# Patient Record
Sex: Female | Born: 1940 | Race: White | Hispanic: No | Marital: Married | Smoking: Never smoker
Health system: Southern US, Community
[De-identification: ages and names within clinical notes are randomized; demographics above are authoritative.]

## PROBLEM LIST (undated history)

## (undated) DIAGNOSIS — N393 Stress incontinence (female) (male): Secondary | ICD-10-CM

## (undated) DIAGNOSIS — J479 Bronchiectasis, uncomplicated: Secondary | ICD-10-CM

## (undated) DIAGNOSIS — S32001A Stable burst fracture of unspecified lumbar vertebra, initial encounter for closed fracture: Secondary | ICD-10-CM

## (undated) DIAGNOSIS — E039 Hypothyroidism, unspecified: Secondary | ICD-10-CM

## (undated) DIAGNOSIS — C8580 Other specified types of non-Hodgkin lymphoma, unspecified site: Secondary | ICD-10-CM

## (undated) DIAGNOSIS — M542 Cervicalgia: Secondary | ICD-10-CM

## (undated) DIAGNOSIS — H3530 Unspecified macular degeneration: Secondary | ICD-10-CM

## (undated) DIAGNOSIS — I10 Essential (primary) hypertension: Secondary | ICD-10-CM

## (undated) DIAGNOSIS — D479 Neoplasm of uncertain behavior of lymphoid, hematopoietic and related tissue, unspecified: Secondary | ICD-10-CM

## (undated) DIAGNOSIS — Z9889 Other specified postprocedural states: Secondary | ICD-10-CM

## (undated) DIAGNOSIS — D649 Anemia, unspecified: Secondary | ICD-10-CM

## (undated) DIAGNOSIS — B029 Zoster without complications: Secondary | ICD-10-CM

## (undated) DIAGNOSIS — K5909 Other constipation: Secondary | ICD-10-CM

## (undated) DIAGNOSIS — R112 Nausea with vomiting, unspecified: Secondary | ICD-10-CM

## (undated) DIAGNOSIS — J189 Pneumonia, unspecified organism: Secondary | ICD-10-CM

## (undated) DIAGNOSIS — R011 Cardiac murmur, unspecified: Secondary | ICD-10-CM

## (undated) DIAGNOSIS — Z973 Presence of spectacles and contact lenses: Secondary | ICD-10-CM

## (undated) DIAGNOSIS — E0590 Thyrotoxicosis, unspecified without thyrotoxic crisis or storm: Secondary | ICD-10-CM

## (undated) DIAGNOSIS — R0602 Shortness of breath: Secondary | ICD-10-CM

## (undated) DIAGNOSIS — M8580 Other specified disorders of bone density and structure, unspecified site: Secondary | ICD-10-CM

## (undated) DIAGNOSIS — K219 Gastro-esophageal reflux disease without esophagitis: Secondary | ICD-10-CM

## (undated) DIAGNOSIS — Z9289 Personal history of other medical treatment: Secondary | ICD-10-CM

## (undated) DIAGNOSIS — C801 Malignant (primary) neoplasm, unspecified: Secondary | ICD-10-CM

## (undated) DIAGNOSIS — E785 Hyperlipidemia, unspecified: Secondary | ICD-10-CM

## (undated) DIAGNOSIS — R161 Splenomegaly, not elsewhere classified: Secondary | ICD-10-CM

## (undated) DIAGNOSIS — M1990 Unspecified osteoarthritis, unspecified site: Secondary | ICD-10-CM

## (undated) HISTORY — PX: DILATION AND CURETTAGE OF UTERUS: SHX78

## (undated) HISTORY — PX: CATARACT EXTRACTION, BILATERAL: SHX1313

## (undated) HISTORY — DX: Other constipation: K59.09

## (undated) HISTORY — PX: EYE SURGERY: SHX253

## (undated) HISTORY — PX: WISDOM TOOTH EXTRACTION: SHX21

## (undated) HISTORY — PX: FRACTURE SURGERY: SHX138

## (undated) HISTORY — PX: TUBAL LIGATION: SHX77

## (undated) HISTORY — PX: TONSILLECTOMY: SUR1361

## (undated) HISTORY — PX: COLONOSCOPY: SHX174

## (undated) HISTORY — DX: Other specified disorders of bone density and structure, unspecified site: M85.80

## (undated) HISTORY — DX: Hyperlipidemia, unspecified: E78.5

## (undated) HISTORY — PX: CATARACT EXTRACTION: SUR2

## (undated) HISTORY — DX: Unspecified macular degeneration: H35.30

## (undated) HISTORY — DX: Hypothyroidism, unspecified: E03.9

## (undated) HISTORY — PX: TONSILLECTOMY AND ADENOIDECTOMY: SHX28

---
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
## 1978-03-04 HISTORY — PX: BREAST EXCISIONAL BIOPSY: SUR124

## 1997-03-04 HISTORY — PX: CARPAL TUNNEL RELEASE: SHX101

## 1997-07-08 ENCOUNTER — Other Ambulatory Visit: Admission: RE | Admit: 1997-07-08 | Discharge: 1997-07-08 | Payer: Self-pay | Admitting: Obstetrics and Gynecology

## 1997-10-11 ENCOUNTER — Ambulatory Visit (HOSPITAL_COMMUNITY): Admission: RE | Admit: 1997-10-11 | Discharge: 1997-10-11 | Payer: Self-pay | Admitting: Obstetrics and Gynecology

## 1998-02-13 ENCOUNTER — Other Ambulatory Visit: Admission: RE | Admit: 1998-02-13 | Discharge: 1998-02-13 | Payer: Self-pay | Admitting: Obstetrics and Gynecology

## 1998-10-31 ENCOUNTER — Ambulatory Visit (HOSPITAL_COMMUNITY)
Admission: RE | Admit: 1998-10-31 | Discharge: 1998-10-31 | Payer: Self-pay | Admitting: Physical Medicine & Rehabilitation

## 1998-10-31 ENCOUNTER — Encounter: Payer: Self-pay | Admitting: Obstetrics and Gynecology

## 1998-11-02 ENCOUNTER — Ambulatory Visit (HOSPITAL_COMMUNITY): Admission: RE | Admit: 1998-11-02 | Discharge: 1998-11-02 | Payer: Self-pay | Admitting: Obstetrics and Gynecology

## 1998-11-02 ENCOUNTER — Encounter: Payer: Self-pay | Admitting: Obstetrics and Gynecology

## 1999-03-08 ENCOUNTER — Other Ambulatory Visit: Admission: RE | Admit: 1999-03-08 | Discharge: 1999-03-08 | Payer: Self-pay | Admitting: Obstetrics and Gynecology

## 1999-05-25 ENCOUNTER — Encounter (INDEPENDENT_AMBULATORY_CARE_PROVIDER_SITE_OTHER): Payer: Self-pay

## 1999-05-25 ENCOUNTER — Ambulatory Visit (HOSPITAL_COMMUNITY): Admission: RE | Admit: 1999-05-25 | Discharge: 1999-05-25 | Payer: Self-pay | Admitting: Obstetrics and Gynecology

## 1999-12-04 ENCOUNTER — Ambulatory Visit (HOSPITAL_COMMUNITY): Admission: RE | Admit: 1999-12-04 | Discharge: 1999-12-04 | Payer: Self-pay | Admitting: Obstetrics and Gynecology

## 1999-12-04 ENCOUNTER — Encounter: Payer: Self-pay | Admitting: Obstetrics and Gynecology

## 2001-04-02 ENCOUNTER — Encounter: Admission: RE | Admit: 2001-04-02 | Discharge: 2001-04-02 | Payer: Self-pay | Admitting: Obstetrics and Gynecology

## 2001-04-02 ENCOUNTER — Encounter: Payer: Self-pay | Admitting: Obstetrics and Gynecology

## 2002-07-05 ENCOUNTER — Ambulatory Visit (HOSPITAL_COMMUNITY): Admission: RE | Admit: 2002-07-05 | Discharge: 2002-07-05 | Payer: Self-pay | Admitting: Obstetrics and Gynecology

## 2002-07-05 ENCOUNTER — Encounter: Payer: Self-pay | Admitting: Obstetrics and Gynecology

## 2003-07-04 ENCOUNTER — Other Ambulatory Visit: Admission: RE | Admit: 2003-07-04 | Discharge: 2003-07-04 | Payer: Self-pay | Admitting: Obstetrics and Gynecology

## 2003-08-03 ENCOUNTER — Encounter: Admission: RE | Admit: 2003-08-03 | Discharge: 2003-08-03 | Payer: Self-pay | Admitting: Obstetrics and Gynecology

## 2004-01-23 ENCOUNTER — Ambulatory Visit: Payer: Self-pay | Admitting: Internal Medicine

## 2004-08-03 ENCOUNTER — Other Ambulatory Visit: Admission: RE | Admit: 2004-08-03 | Discharge: 2004-08-03 | Payer: Self-pay | Admitting: Obstetrics and Gynecology

## 2004-09-11 ENCOUNTER — Encounter: Admission: RE | Admit: 2004-09-11 | Discharge: 2004-09-11 | Payer: Self-pay | Admitting: Obstetrics and Gynecology

## 2005-03-12 ENCOUNTER — Ambulatory Visit: Payer: Self-pay | Admitting: Family Medicine

## 2005-04-16 ENCOUNTER — Ambulatory Visit: Payer: Self-pay | Admitting: Internal Medicine

## 2005-05-14 ENCOUNTER — Ambulatory Visit: Payer: Self-pay | Admitting: Internal Medicine

## 2005-06-21 ENCOUNTER — Ambulatory Visit: Payer: Self-pay | Admitting: Internal Medicine

## 2005-09-09 ENCOUNTER — Ambulatory Visit: Payer: Self-pay | Admitting: Internal Medicine

## 2005-09-10 ENCOUNTER — Ambulatory Visit: Payer: Self-pay | Admitting: Family Medicine

## 2005-09-26 ENCOUNTER — Encounter: Admission: RE | Admit: 2005-09-26 | Discharge: 2005-09-26 | Payer: Self-pay | Admitting: Obstetrics and Gynecology

## 2005-09-27 ENCOUNTER — Ambulatory Visit: Payer: Self-pay | Admitting: Internal Medicine

## 2006-07-03 ENCOUNTER — Ambulatory Visit: Payer: Self-pay | Admitting: Internal Medicine

## 2006-07-03 LAB — CONVERTED CEMR LAB
Direct LDL: 155.8 mg/dL
VLDL: 14 mg/dL (ref 0–40)

## 2006-07-11 ENCOUNTER — Encounter: Admission: RE | Admit: 2006-07-11 | Discharge: 2006-07-11 | Payer: Self-pay | Admitting: Internal Medicine

## 2006-08-06 DIAGNOSIS — M949 Disorder of cartilage, unspecified: Secondary | ICD-10-CM

## 2006-08-06 DIAGNOSIS — M899 Disorder of bone, unspecified: Secondary | ICD-10-CM | POA: Insufficient documentation

## 2006-08-06 DIAGNOSIS — R32 Unspecified urinary incontinence: Secondary | ICD-10-CM

## 2006-08-06 DIAGNOSIS — E039 Hypothyroidism, unspecified: Secondary | ICD-10-CM

## 2006-08-06 DIAGNOSIS — Z85828 Personal history of other malignant neoplasm of skin: Secondary | ICD-10-CM

## 2006-10-01 ENCOUNTER — Encounter: Admission: RE | Admit: 2006-10-01 | Discharge: 2006-10-01 | Payer: Self-pay | Admitting: Obstetrics and Gynecology

## 2006-10-13 ENCOUNTER — Encounter: Payer: Self-pay | Admitting: Internal Medicine

## 2006-12-12 ENCOUNTER — Other Ambulatory Visit
Admission: RE | Admit: 2006-12-12 | Discharge: 2006-12-12 | Payer: Self-pay | Admitting: Physical Medicine & Rehabilitation

## 2007-04-24 ENCOUNTER — Encounter: Payer: Self-pay | Admitting: Internal Medicine

## 2007-07-17 ENCOUNTER — Ambulatory Visit: Payer: Self-pay | Admitting: Internal Medicine

## 2007-07-19 ENCOUNTER — Encounter: Payer: Self-pay | Admitting: Internal Medicine

## 2007-07-19 DIAGNOSIS — R930 Abnormal findings on diagnostic imaging of skull and head, not elsewhere classified: Secondary | ICD-10-CM | POA: Insufficient documentation

## 2007-07-20 ENCOUNTER — Telehealth (INDEPENDENT_AMBULATORY_CARE_PROVIDER_SITE_OTHER): Payer: Self-pay | Admitting: *Deleted

## 2007-07-20 ENCOUNTER — Encounter (INDEPENDENT_AMBULATORY_CARE_PROVIDER_SITE_OTHER): Payer: Self-pay | Admitting: *Deleted

## 2007-07-24 ENCOUNTER — Ambulatory Visit: Payer: Self-pay | Admitting: Cardiovascular Disease

## 2007-07-28 ENCOUNTER — Telehealth (INDEPENDENT_AMBULATORY_CARE_PROVIDER_SITE_OTHER): Payer: Self-pay | Admitting: *Deleted

## 2007-07-28 ENCOUNTER — Encounter: Payer: Self-pay | Admitting: Internal Medicine

## 2007-07-28 ENCOUNTER — Encounter (INDEPENDENT_AMBULATORY_CARE_PROVIDER_SITE_OTHER): Payer: Self-pay | Admitting: *Deleted

## 2007-07-31 ENCOUNTER — Encounter: Payer: Self-pay | Admitting: Internal Medicine

## 2007-09-02 ENCOUNTER — Ambulatory Visit: Payer: Self-pay | Admitting: Internal Medicine

## 2007-09-03 ENCOUNTER — Telehealth (INDEPENDENT_AMBULATORY_CARE_PROVIDER_SITE_OTHER): Payer: Self-pay | Admitting: *Deleted

## 2007-09-11 ENCOUNTER — Telehealth (INDEPENDENT_AMBULATORY_CARE_PROVIDER_SITE_OTHER): Payer: Self-pay | Admitting: *Deleted

## 2007-09-11 LAB — CONVERTED CEMR LAB
Albumin: 4 g/dL (ref 3.5–5.2)
Alkaline Phosphatase: 60 units/L (ref 39–117)
BUN: 15 mg/dL (ref 6–23)
Basophils Absolute: 0.1 10*3/uL (ref 0.0–0.1)
Chloride: 106 meq/L (ref 96–112)
Cholesterol: 233 mg/dL (ref 0–200)
Eosinophils Absolute: 0.1 10*3/uL (ref 0.0–0.7)
GFR calc Af Amer: 92 mL/min
GFR calc non Af Amer: 76 mL/min
HCT: 37.5 % (ref 36.0–46.0)
HDL: 59.2 mg/dL (ref >39.0)
MCHC: 34.5 g/dL (ref 30.0–36.0)
MCV: 80.3 fL (ref 78.0–100.0)
Monocytes Absolute: 0.5 10*3/uL (ref 0.1–1.0)
Neutrophils Relative %: 57.9 % (ref 43.0–77.0)
Platelets: 283 10*3/uL (ref 150–400)
Potassium: 4 meq/L (ref 3.5–5.1)
RDW: 13.4 % (ref 11.5–14.6)
Sodium: 142 meq/L (ref 135–145)
Total Bilirubin: 0.8 mg/dL (ref 0.3–1.2)
Triglycerides: 76 mg/dL (ref 0–149)

## 2007-10-20 ENCOUNTER — Encounter: Admission: RE | Admit: 2007-10-20 | Discharge: 2007-10-20 | Payer: Self-pay | Admitting: Obstetrics and Gynecology

## 2008-01-18 ENCOUNTER — Ambulatory Visit: Payer: Self-pay | Admitting: Internal Medicine

## 2008-01-19 LAB — CONVERTED CEMR LAB: TSH: 3.15 microintl units/mL (ref 0.35–5.50)

## 2008-01-20 ENCOUNTER — Encounter (INDEPENDENT_AMBULATORY_CARE_PROVIDER_SITE_OTHER): Payer: Self-pay | Admitting: *Deleted

## 2008-02-04 ENCOUNTER — Telehealth (INDEPENDENT_AMBULATORY_CARE_PROVIDER_SITE_OTHER): Payer: Self-pay | Admitting: *Deleted

## 2008-09-12 ENCOUNTER — Telehealth: Payer: Self-pay | Admitting: Internal Medicine

## 2008-09-12 DIAGNOSIS — M542 Cervicalgia: Secondary | ICD-10-CM | POA: Insufficient documentation

## 2008-09-21 ENCOUNTER — Encounter: Admission: RE | Admit: 2008-09-21 | Discharge: 2008-12-20 | Payer: Self-pay | Admitting: Internal Medicine

## 2008-09-23 ENCOUNTER — Encounter: Payer: Self-pay | Admitting: Internal Medicine

## 2008-10-25 ENCOUNTER — Encounter: Admission: RE | Admit: 2008-10-25 | Discharge: 2008-10-25 | Payer: Self-pay | Admitting: Obstetrics and Gynecology

## 2008-11-14 ENCOUNTER — Telehealth (INDEPENDENT_AMBULATORY_CARE_PROVIDER_SITE_OTHER): Payer: Self-pay | Admitting: *Deleted

## 2008-11-22 ENCOUNTER — Encounter: Payer: Self-pay | Admitting: Internal Medicine

## 2008-12-02 ENCOUNTER — Telehealth: Payer: Self-pay | Admitting: Internal Medicine

## 2008-12-05 ENCOUNTER — Other Ambulatory Visit: Admission: RE | Admit: 2008-12-05 | Discharge: 2008-12-05 | Payer: Self-pay | Admitting: Obstetrics and Gynecology

## 2009-01-05 ENCOUNTER — Ambulatory Visit: Payer: Self-pay | Admitting: Internal Medicine

## 2009-01-06 ENCOUNTER — Encounter (INDEPENDENT_AMBULATORY_CARE_PROVIDER_SITE_OTHER): Payer: Self-pay | Admitting: *Deleted

## 2009-01-09 ENCOUNTER — Encounter: Payer: Self-pay | Admitting: Internal Medicine

## 2009-01-12 ENCOUNTER — Ambulatory Visit: Payer: Self-pay | Admitting: Internal Medicine

## 2009-01-12 DIAGNOSIS — E785 Hyperlipidemia, unspecified: Secondary | ICD-10-CM

## 2009-02-15 ENCOUNTER — Telehealth: Payer: Self-pay | Admitting: Internal Medicine

## 2009-04-11 ENCOUNTER — Encounter: Payer: Self-pay | Admitting: Internal Medicine

## 2009-04-21 ENCOUNTER — Ambulatory Visit: Payer: Self-pay | Admitting: Internal Medicine

## 2009-04-21 DIAGNOSIS — G5600 Carpal tunnel syndrome, unspecified upper limb: Secondary | ICD-10-CM

## 2009-04-21 DIAGNOSIS — R269 Unspecified abnormalities of gait and mobility: Secondary | ICD-10-CM

## 2009-04-21 DIAGNOSIS — M6281 Muscle weakness (generalized): Secondary | ICD-10-CM

## 2009-04-24 LAB — CONVERTED CEMR LAB
Basophils Absolute: 0 10*3/uL (ref 0.0–0.1)
Basophils Relative: 0.3 % (ref 0.0–3.0)
Eosinophils Absolute: 0.1 10*3/uL (ref 0.0–0.7)
Lymphocytes Relative: 22.8 % (ref 12.0–46.0)
MCHC: 33 g/dL (ref 30.0–36.0)
Magnesium: 2.3 mg/dL (ref 1.5–2.5)
Monocytes Relative: 10.7 % (ref 3.0–12.0)
Neutrophils Relative %: 64.4 % (ref 43.0–77.0)
Potassium: 4.4 meq/L (ref 3.5–5.1)
RBC: 4.47 M/uL (ref 3.87–5.11)
RDW: 13.1 % (ref 11.5–14.6)

## 2009-06-07 ENCOUNTER — Encounter: Payer: Self-pay | Admitting: Internal Medicine

## 2009-08-16 ENCOUNTER — Encounter: Payer: Self-pay | Admitting: Internal Medicine

## 2009-09-01 ENCOUNTER — Ambulatory Visit: Payer: Self-pay | Admitting: Family Medicine

## 2009-09-01 ENCOUNTER — Inpatient Hospital Stay (HOSPITAL_COMMUNITY): Admission: AD | Admit: 2009-09-01 | Discharge: 2009-09-03 | Payer: Self-pay | Admitting: Internal Medicine

## 2009-09-01 DIAGNOSIS — J189 Pneumonia, unspecified organism: Secondary | ICD-10-CM

## 2009-09-11 ENCOUNTER — Ambulatory Visit: Payer: Self-pay | Admitting: Internal Medicine

## 2009-09-11 DIAGNOSIS — R6883 Chills (without fever): Secondary | ICD-10-CM

## 2009-09-14 ENCOUNTER — Telehealth (INDEPENDENT_AMBULATORY_CARE_PROVIDER_SITE_OTHER): Payer: Self-pay | Admitting: *Deleted

## 2009-09-15 LAB — CONVERTED CEMR LAB
Basophils Absolute: 0.1 10*3/uL (ref 0.0–0.1)
CO2: 30 meq/L (ref 19–32)
Calcium: 9 mg/dL (ref 8.4–10.5)
Creatinine, Ser: 0.8 mg/dL (ref 0.4–1.2)
Eosinophils Absolute: 0.1 10*3/uL (ref 0.0–0.7)
Glucose, Bld: 71 mg/dL (ref 70–99)
Lymphocytes Relative: 26.1 % (ref 12.0–46.0)
MCHC: 33.7 g/dL (ref 30.0–36.0)
Neutrophils Relative %: 60.8 % (ref 43.0–77.0)
Platelets: 458 10*3/uL — ABNORMAL HIGH (ref 150.0–400.0)
RDW: 14.1 % (ref 11.5–14.6)

## 2009-10-02 ENCOUNTER — Telehealth (INDEPENDENT_AMBULATORY_CARE_PROVIDER_SITE_OTHER): Payer: Self-pay | Admitting: *Deleted

## 2009-10-05 ENCOUNTER — Ambulatory Visit: Payer: Self-pay | Admitting: Internal Medicine

## 2009-10-31 ENCOUNTER — Encounter: Admission: RE | Admit: 2009-10-31 | Discharge: 2009-10-31 | Payer: Self-pay | Admitting: Obstetrics and Gynecology

## 2010-04-01 LAB — CONVERTED CEMR LAB
Hgb A1c MFr Bld: 5.7 % (ref 4.6–6.5)
TSH: 1.14 microintl units/mL (ref 0.35–5.50)

## 2010-04-03 NOTE — Assessment & Plan Note (Signed)
Summary: HOSPITAL FOLLOWUP/KN   Vital Signs:  Patient profile:   70 year old female Weight:      151.4 pounds O2 Sat:      97 % on Room air Temp:     98.3 degrees F oral Pulse rate:   72 / minute Resp:     14 per minute BP sitting:   118 / 70  (left arm) Cuff size:   large  Vitals Entered By: Shonna Chock (September 11, 2009 11:30 AM)  O2 Flow:  Room air CC: Hospital Follow-up: Pneumonia, Stopped Chlosterol med-made patient sick Comments REVIEWED MED LIST, PATIENT AGREED DOSE AND INSTRUCTION CORRECT    CC:  Hospital Follow-up: Pneumonia and Stopped Chlosterol med-made patient sick.  History of Present Illness: S/P OS cataract resection 08/22/2009; she developed  nausea & weakness 06/23 which was progressive. PNA was dxed 06/29; she had had intermittent NP cough in context of Temp to104.2. She took Levaquin X 3 days  but  it was stopped due to  N&V. Admitted with dehydration 07/01; she left 07/03. Now residual nausea & weakness.Intermittent chills.  Allergies (verified): 1)  ! Levaquin 2)  ! Pravachol  Review of Systems General:  Denies fever and sweats. Resp:  Complains of shortness of breath; denies chest pain with inspiration, cough, coughing up blood, sputum productive, and wheezing. GI:  Denies abdominal pain, constipation, and diarrhea; On Miralax every other day . GU:  Denies discharge, dysuria, and hematuria; No dark urine.  Physical Exam  Eyes:  No corneal or conjunctival inflammation noted. Perrla.No icterus Ears:  External ear exam shows no significant lesions or deformities.  Otoscopic examination reveals clear canals, tympanic membranes are intact bilaterally without bulging, retraction, inflammation or discharge. Hearing is grossly normal bilaterally. Nose:  External nasal examination shows no deformity or inflammation. Nasal mucosa are pink and moist without lesions or exudates. Mouth:  Oral mucosa and oropharynx without lesions or exudates.  Tongue moist Lungs:   Normal respiratory effort, chest expands symmetrically. Lungs are clear to auscultation, no crackles or wheezes. Heart:  normal rate, regular rhythm, no gallop, no rub, no JVD, no HJR, and grade 1 /6 systolic murmur.   Abdomen:  Bowel sounds positive,abdomen soft and non-tender without masses, organomegaly or hernias noted. Skin:  Intact without suspicious lesions or rashes. Tenting Cervical Nodes:  No lymphadenopathy noted Axillary Nodes:  No palpable lymphadenopathy   Impression & Recommendations:  Problem # 1:  NAUSEA (ICD-787.02)  Orders: Venipuncture (16109) TLB-BMP (Basic Metabolic Panel-BMET) (80048-METABOL)  Problem # 2:  CHILLS WITHOUT FEVER (ICD-780.64)  Orders: Venipuncture (60454) TLB-CBC Platelet - w/Differential (85025-CBCD) T-2 View CXR (71020TC)  Problem # 3:  PNEUMONIA (ICD-486)  Orders: Venipuncture (09811) TLB-CBC Platelet - w/Differential (85025-CBCD) T-2 View CXR (71020TC)  Complete Medication List: 1)  Synthroid 100 Mcg Tabs (Levothyroxine sodium) .[name].. 1 by mouth once daily except 1&1/2 q weds 2)  Fosamax 70 Mg Tabs (Alendronate sodium) .[name].. 1 by mouth q week 3)  Vitamin D 1000 Unit Tabs (Cholecalciferol) .... 2 once daily  Patient Instructions: 1)  Please use samples of Prilosec OTC once daily until nausea has resolved

## 2010-04-03 NOTE — Progress Notes (Signed)
Summary: Referral request  Phone Note Call from Patient Call back at Home Phone [phone]   Caller: Patient Reason for Call: Referral Summary of Call: Patient is calling to request a referral for physical therapy for her neck pain.  Has been seeing a chiropractor, but no help.  She feels therapy would be benecial.  Wants [facility] on [address]. Pt doesn't want to come in for an office visit just to get referral, oK? Initial call taken by: Magdalen Spatz [facility],  September 12, 2008 4:04 PM  Follow-up for Phone Call        last seen 08/2007, [facility] PLEASE ADVISE Follow-up by: Shonna Chock,  September 13, 2008 7:54 AM  Additional Follow-up for Phone Call Additional follow up Details #1::        done Additional Follow-up by: Marga Melnick MD,  September 13, 2008 8:14 AM  New Problems: CERVICALGIA (ICD-723.1)   New Problems: CERVICALGIA (ICD-723.1)

## 2010-04-03 NOTE — Assessment & Plan Note (Signed)
Summary: weakness in legs/scm   Vital Signs:  Patient profile:   70 year old female Weight:      156.4 pounds Temp:     97.8 degrees F oral Pulse rate:   60 / minute Resp:     14 per minute BP sitting:   138 / 80  (left arm) Cuff size:   large  Vitals Entered By: Shonna Chock (April 21, 2009 12:24 PM) CC: Weakness in legs  Comments REVIEWED MED LIST, PATIENT AGREED DOSE AND INSTRUCTION CORRECT    CC:  Weakness in legs .  History of Present Illness: Intermittent weakness w/o pain  in arms & legs for 3 weeks w/o trigger or overuse; symptoms worse with wine & walking . Some "listing" to R with walking.. She is on Fosamax, statin & thyroid. L hand N&T for 3 yrs; S/P CTS RUE.  Allergies (verified): No Known Drug Allergies  Review of Systems General:  Denies chills, fever, sweats, and weight loss. Eyes:  Denies blurring, double vision, and vision loss-both eyes. CV:  Denies chest pain or discomfort and palpitations. GI:  Complains of constipation; denies diarrhea; Dr Matthias Hughs saw her recently. MS:  Complains of joint pain; denies joint redness, joint swelling, low back pain, mid back pain, muscle aches, cramps, and thoracic pain. Derm:  Denies lesion(s) and rash. Neuro:  Complains of numbness and tingling; denies brief paralysis and sensation of room spinning; N&T L hand.  Physical Exam  General:  well-nourished,in no acute distress; alert,appropriate and cooperative throughout examination Eyes:  No corneal or conjunctival inflammation noted. EOMI. Perrla. No nystagmus or lid lag Neck:  No deformities, masses, or tenderness noted. Abdomen:  Bowel sounds positive,abdomen soft and non-tender without masses, organomegaly or hernias noted. Pulses:  R and L carotid  pulses are full and equal bilaterally Extremities:  No clubbing, cyanosis, edema. minor OA changes. + Tinel's L hand. Neg Homan's Neurologic:  alert & oriented X3, cranial nerves II-XII intact, strength normal in all  extremities, sensation intact to light touch, gait normal, DTRs symmetrical and normal, finger-to-nose normal, and Romberg negative.   Skin:  Intact without suspicious lesions or rashes Cervical Nodes:  No lymphadenopathy noted Axillary Nodes:  No palpable lymphadenopathy Psych:  memory intact for recent and remote, normally interactive, and good eye contact.     Impression & Recommendations:  Problem # 1:  MUSCLE WEAKNESS (GENERALIZED) (ICD-728.87)  LE > UE; on statin  Orders: Venipuncture (04540) TLB-CBC Platelet - w/Differential (85025-CBCD) TLB-Calcium (82310-CA) TLB-Potassium (K+) (84132-K) TLB-Magnesium (Mg) (83735-MG) TLB-CK Total Only(Creatine Kinase/CPK) (82550-CK) T-Vitamin D (25-Hydroxy) (98119-14782)  Problem # 2:  GAIT DISTURBANCE (ICD-781.2) "listing" to R  Problem # 3:  HYPOTHYROIDISM (ICD-244.9)  Her updated medication list for this problem includes:    Synthroid 100 Mcg Tabs (Levothyroxine sodium) .[name]... 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 1 by mouth once daily except 1&1/2 q weds 2)  Fosamax 70 Mg Tabs (Alendronate sodium) .[name].. 1 by mouth q week 3)  Pravastatin Sodium 20 Mg Tabs (Pravastatin sodium) .[name].. 1 at bedtime  Patient Instructions: 1)  Hold Pravastatin until labs return.  We may consider Co Q 10 & /or PT

## 2010-04-03 NOTE — Letter (Signed)
Summary: [facility]  [facility]   Imported By: Lanelle Bal 06/19/2009 12:45:50  _____________________________________________________________________  External Attachment:    Type:   Image     Comment:   External Document

## 2010-04-03 NOTE — Letter (Signed)
[facility]: [facility]   Imported By: Lanelle Bal 04/17/2009 13:49:48  _____________________________________________________________________  External Attachment:    Type:   Image     Comment:   External Document

## 2010-04-03 NOTE — Progress Notes (Signed)
Summary: XRay Concerns  Phone Note Call from Patient Call back at Home Phone [phone]   Caller: Patient Summary of Call: Patient would like order placed to recheck Xray, patient aware I will send order over. Patient said she will recheck on Wed afternoon   Shonna Chock CMA  October 02, 2009 5:08 PM

## 2010-04-03 NOTE — Letter (Signed)
Summary: [facility]  [facility]   Imported By: Lanelle Bal 09/01/2009 15:28:31  _____________________________________________________________________  External Attachment:    Type:   Image     Comment:   External Document

## 2010-04-03 NOTE — Progress Notes (Signed)
Summary: Request for Radiology/Lab results  Phone Note Call from Patient Call back at Home Phone [phone]   Caller: Patient Summary of Call: Message left on VM: Patient would like results of Xray and labs    [facility] CMA  September 14, 2009 4:20 PM   Follow-up for Phone Call        Spoke with patient:  Pneumonia changes improving. Please blow up @ least 10 balloons daily or use Incentive Spirometer if provided by the hospital. Repeat CXray in 7-10 days, sooner if respiratory symptoms progress. Hopp  Patient ok'd all information, and aware I will mail her labs once reviewed.  Follow-up by: Shonna Chock CMA,  September 14, 2009 4:24 PM

## 2010-04-03 NOTE — Assessment & Plan Note (Signed)
Summary: vomiting- /cbs   Vital Signs:  Patient profile:   70 year old female Weight:      156 pounds O2 Sat:      98 % on Room air Temp:     96.5 degrees F oral Pulse rate:   58 / minute BP sitting:   160 / 90  (left arm)  Vitals Entered By: Doristine Devoid (September 01, 2009 1:39 PM)  O2 Flow:  Room air CC: fever up to 104 intermittent vomiting xsun. worse today especially w/ movement unable to keep down any fluids    History of Present Illness: 70 yo woman here today w/ vomiting.  reports coughing, weakness and nausea for 1 week.  got very pale and weak at church and they came home early, stayed in bed all day.  started vomiting Tuesday.  Wed started alternating fevers and chills, Tm 104.2.  went to UC- c/o pain w/ inspiration, CXR dx'd PNA.  started Levaquin Wednesday night (has had 2 doses).  today has been vomiting since 6 am, especially w/ position change or cough.  + dizziness.  nausea w/ opening her eyes.  not able to tolerate liquids.  no diarrhea.  + SOB- difficult to catch breath, + dry mouth.  vomiting in office x5  Problems Prior to Update: 1)  Carpal Tunnel Syndrome, Left  (ICD-354.0) 2)  Gait Disturbance  (ICD-781.2) 3)  Muscle Weakness (GENERALIZED)  (ICD-728.87) 4)  Hyperlipidemia  (ICD-272.4) 5)  Cervicalgia  (ICD-723.1) 6)  Ct, Chest, Abnormal  (ICD-793.1) 7)  Family History Diabetes 1st Degree Relative  (ICD-V18.0) 8)  Urinary Incontinence  (ICD-788.30) 9)  Osteopenia  (ICD-733.90) 10)  Hypothyroidism  (ICD-244.9) 11)  Skin Cancer, Hx of  (ICD-V10.83)  Current Medications (verified): 1)  Synthroid 100 Mcg Tabs (Levothyroxine Sodium) .[name].. 1 By Mouth Once Daily Except 1&1/2 Q Weds 2)  Fosamax 70 Mg  Tabs (Alendronate Sodium) .[name].. 1 By Mouth Q Week 3)  Pravastatin Sodium 20 Mg Tabs (Pravastatin Sodium) .[name].. 1 At Bedtime 4)  Vitamin D 1000 Unit Tabs (Cholecalciferol) .... 2 Once Daily 5)  Durezol 0.05 % Emul (Difluprednate) .... 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 LDL goal = < 100, ideally < 80.  Past Surgical History: Last updated: 01/12/2009 C-section times one Carpal tunnel release 1989 Tonsillectomy D and C 1999,2001 colonoscopy negative  2004(due 2014)  Family History: Last updated: 01/12/2009 Family History Stroke: PGGF,MGF Father: thyroid disease, renal failure,DM Mother: breast CA, ovarian CA Siblings: bro thyroid disease; no MI in FH  Social History: Last updated: 01/12/2009 Never Smoked Retired Married Alcohol use-yes Regular exercise-yes  Review of Systems      See HPI  Physical Exam  General:  pale, lying on bed under blanket, obviously uncomfortable Head:  NCAT Eyes:  PERRL Mouth:  tacky mucous membranes Lungs:  Normal respiratory effort, chest expands symmetrically. Lungs are clear to auscultation, no crackles or wheezes. Heart:  reg S1/S2 Abdomen:  soft, NT/ND, +BS Pulses:  +2 radial, DP pulses Extremities:  no C/C/E   Impression & Recommendations:  Problem # 1:  VOMITING (ICD-787.03) Assessment New intractable.  will give phenergan injxn in office.  admit for fluids and management.  unable to tolerate oral abx.  convert to IV  Problem # 2:  PNEUMONIA (ICD-486) Assessment: New reported by pt and husband, dx'd at UC 2 days ago and started on Levaquin.  unable to tolerate oral abx- switch to IV.  Problem # 3:  HYPOTHYROIDISM (ICD-244.9) Assessment: Unchanged continue Synthroid Her updated medication list for this problem includes:    Synthroid 100 Mcg Tabs (Levothyroxine sodium) .[name]... 1 by mouth once daily except 1&1/2 q weds  Complete Medication List: 1)  Synthroid 100 Mcg Tabs (Levothyroxine sodium) .[name].. 1 by mouth once daily  except 1&1/2 q weds 2)  Fosamax 70 Mg Tabs (Alendronate sodium) .[name].. 1 by mouth q week 3)  Pravastatin Sodium 20 Mg Tabs (Pravastatin sodium) .[name].. 1 at bedtime 4)  Vitamin D 1000 Unit Tabs (Cholecalciferol) .... 2 once daily 5)  Durezol 0.05 % Emul (Difluprednate) .... Use three times a day in l eye  Appended Document: vomiting- /cbs    Clinical Lists Changes  Orders: Added new Service order of Promethazine up to 50mg  (U7253) - Signed Added new Service order of Admin of Therapeutic Inj  intramuscular or subcutaneous (66440) - Signed       Medication Administration  Injection # 1:    Medication: Promethazine up to 50mg     Diagnosis: VOMITING (ICD-787.03)    Route: IM    Site: RUOQ gluteus    Exp Date: 11/03/2010    Lot #: [id number]    Mfr: Novaplus     Given by: Doristine Devoid (September 01, 2009 2:12 PM)  Orders Added: 1)  Promethazine up to 50mg  [J2550] 2)  Admin of Therapeutic Inj  intramuscular or subcutaneous [95638]

## 2010-04-04 ENCOUNTER — Ambulatory Visit: Payer: BLUE CROSS/BLUE SHIELD

## 2010-04-04 ENCOUNTER — Encounter: Payer: Self-pay | Admitting: Internal Medicine

## 2010-04-04 DIAGNOSIS — Z23 Encounter for immunization: Secondary | ICD-10-CM

## 2010-04-11 NOTE — Assessment & Plan Note (Signed)
Summary: Flu Vaccine  Nurse Visit   Allergies: 1)  ! Levaquin 2)  ! Pravachol  Orders Added: 1)  Flu Vaccine 31yrs + MEDICARE PATIENTS [Q2039] 2)  Administration Flu vaccine - MCR [G0008] Flu Vaccine Consent Questions     Do you have a history of severe allergic reactions to this vaccine? no    Any prior history of allergic reactions to egg and/or gelatin? no    Do you have a sensitivity to the preservative Thimersol? no    Do you have a past history of Guillan-Barre Syndrome? no    Do you currently have an acute febrile illness? no    Have you ever had a severe reaction to latex? no    Vaccine information given and explained to patient? yes    Are you currently pregnant? no    Lot Number:[id number]   Exp Date:09/01/2010   Site Given  Left Deltoid IM.lbmedflu

## 2010-05-20 LAB — BASIC METABOLIC PANEL
BUN: 9 mg/dL (ref 6–23)
CO2: 28 mEq/L (ref 19–32)
Chloride: 110 mEq/L (ref 96–112)
Glucose, Bld: 116 mg/dL — ABNORMAL HIGH (ref 70–99)
Potassium: 4.2 mEq/L (ref 3.5–5.1)
Sodium: 142 mEq/L (ref 135–145)

## 2010-05-20 LAB — COMPREHENSIVE METABOLIC PANEL
ALT: 26 U/L (ref 0–35)
Albumin: 2.9 g/dL — ABNORMAL LOW (ref 3.5–5.2)
Alkaline Phosphatase: 88 U/L (ref 39–117)
BUN: 9 mg/dL (ref 6–23)
Chloride: 104 mEq/L (ref 96–112)
Glucose, Bld: 125 mg/dL — ABNORMAL HIGH (ref 70–99)
Potassium: 4.5 mEq/L (ref 3.5–5.1)
Sodium: 140 mEq/L (ref 135–145)
Total Bilirubin: 0.4 mg/dL (ref 0.3–1.2)
Total Protein: 7 g/dL (ref 6.0–8.3)

## 2010-05-20 LAB — CBC
HCT: 31.2 % — ABNORMAL LOW (ref 36.0–46.0)
Hemoglobin: 10.6 g/dL — ABNORMAL LOW (ref 12.0–15.0)
Hemoglobin: 11.5 g/dL — ABNORMAL LOW (ref 12.0–15.0)
MCH: 27.7 pg (ref 26.0–34.0)
MCH: 27.8 pg (ref 26.0–34.0)
MCHC: 33.9 g/dL (ref 30.0–36.0)
MCV: 81.6 fL (ref 78.0–100.0)
RDW: 13.8 % (ref 11.5–15.5)

## 2010-05-20 LAB — DIFFERENTIAL
Basophils Absolute: 0 10*3/uL (ref 0.0–0.1)
Basophils Relative: 0 % (ref 0–1)
Eosinophils Absolute: 0 10*3/uL (ref 0.0–0.7)
Monocytes Absolute: 0.5 10*3/uL (ref 0.1–1.0)
Monocytes Relative: 6 % (ref 3–12)

## 2010-05-20 LAB — CULTURE, BLOOD (ROUTINE X 2): Culture: NO GROWTH

## 2010-07-03 ENCOUNTER — Other Ambulatory Visit: Payer: Self-pay | Admitting: Internal Medicine

## 2010-07-04 NOTE — Telephone Encounter (Signed)
244.9 TSH 

## 2010-07-17 NOTE — Assessment & Plan Note (Signed)
[name]                        [name], [name]                      MRN:          [id number]  DATE:07/03/2006                            DOB:          [birth year]    Amanda [name] was seen Jul 03, 2006 for two separate issues.   She has had back and hip pain for 4-6 weeks.  She felt it was probably  triggered by moving furniture prior to visiting [place]. The symptoms are  worse when she sits.  The symptoms have progressed and have not  significantly improved despite five chiropractic treatments.  She is  taking Tylenol every 3 hours for relief.  She has a constant pain in the  right lumbosacral hip area and intermittent burning pain in the calf,  sometimes even to the dorsum of the right foot.  She has no inguinal  paresthesias, urinary incontinence, or stool incontinence.  She has had  no rash; but she recently was told she had flat warts by a  dermatologist.   Additionally, she is overdue for a TSH.  She has gained approximately 6  pounds.  She denies any changes in her bowels, in skin or hair, other  than the factors listed above.   She does have a past history of carpal tunnel surgery on the right.   She has been on generic levoxyl 0.1 mg.  She is also on Fosamax but is  not on calcium or vitamin D.  The last bone density on record was with  [name] in 2005, which revealed significant osteopenia despite  improvement in serial studies.   PHYSICAL EXAMINATION:  VITAL SIGNS:  Weight is 148.6 fully clothed,  pulse 52, respiratory rate 18, blood pressure 118/82.  NECK:  She has no significant thyromegaly.  No nodules are palpated.  She has no lymphadenopathy or masses.  A grade 1 systolic murmur is  noted.  CHEST:  Clear without respiratory distress.  ABDOMEN:  Nontender w/o organomegaly.  EXTREMITIES:  Deep tendon reflexes are normal.  Straight leg raise is  negative, although she does have some pain  at the lumbosacral area with  elevation or rotation of the hip.   Her symptoms are suggestive of degenerative disk disease in the  lumbosacral area with an L5/S1 radiculopathy.   An MRI of the lumbosacral spine will be performed, and she will be  placed on gabapentin 100 mg every 8 hours as needed.   TSH will also be checked and generic thyroid renewed.   Calcium supplementation of 1200-1500 mg will be recommended.  A vitamin  D level will be checked.     Titus Dubin. Alwyn Ren, MD,FACP,FCCP  Electronically Signed    [facility]/MedQ  DD: 07/03/2006  DT: 07/03/2006  Job #: [id number]

## 2010-07-20 NOTE — H&P (Signed)
[facility]  Patient:    Amanda [name], Amanda [name]                      MRN: [id number] Adm. Date:  [id number] Attending:  Madelyn Flavors                         History and Physical  HISTORY OF PRESENT ILLNESS:   The patient is a 70 year old, Gravida 4, Para 2, white female who was initially seen for her annual exam on March 08, 1999 complaining of postmenopausal spotting.  A sonohysterogram was performed. Endometrium was noted to be thick and echogenic with 9 mm area and also with an  endometrial polyp 1.4 x 1.0 cm.  The patient was urged to undergo a dilatation nd curettage hysteroscopy to remove the polyp.  Risks of the surgery including anesthetic complications, hemorrhage, infection and damage to adjacent structures including bladder, bowel, blood vessels or ureter were discussed with the patient. She was made aware of the risk of uterine perforation which could result in hemorrhage requiring emergent hysterectomy to save her life or uterine perforation which could result in bowel damage which could result in colostomy or of her forming life threatening peritonitis.  She expresses understand of and acceptance of these risks.  PAST MEDICAL HISTORY:         Hypothyroidism.  The patient with a history of mitral valve prolapse.  She sees Dr. Alwyn Ren, requires no antibiotics.  The patient with a history of anemia.  History of laparoscopy tubal cautery.  History of dilatation and curettage hysteroscopy for another endometrial polyp in 1998. History of cesarean section.  History of wisdom teeth extraction, tonsillectomy and blood transfusion in 1959.  MEDICATIONS:                  Prempro 0.625/5 mg.  Aspirin 325 mg daily. Synthroid 100 mcg daily.  FAMILY HISTORY:               History of postmenopausal breast cancer in the patients mother.  History of ovarian cancer in the patients mother.  History of  thyroid disorder in the patients brother  and father.  History of hypertension in the patients father.  ALLERGIES:                    No known drug allergies.  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  PHYSICAL EXAMINATION:  HEENT:                        Normal.  NECK:                         Supple without thyromegaly.  LUNGS:                        Clear to auscultation.  CARDIAC:                      Regular rate and rhythm.  ABDOMEN:                      Soft and nontender. No hepatosplenomegaly.  PELVIC:  BUS normal. Uterus normal and mobile. No adnexal ass palpated.  RECTAL:                       Confirmatory.  No masses.  Pap smear performed on March 08, 1999 and is within normal limits.  ASSESSMENT/PLAN:              The patient is a 70 year old, Gravida 4, Para 2, ith postmenopausal bleeding and endometrial polyp admitted for dilatation and curettage hysteroscopy.  The risks have been explained to the patient.  She expresses understanding of and acceptance of these risks. DD:  05/25/99 TD:  05/25/99 Job: [id number]

## 2010-07-20 NOTE — Assessment & Plan Note (Signed)
[facility]                                 ON-CALL NOTE   NAME:Amanda [name], Amanda [name]                  MRN:          [id number]  DATE:01/13/2007                            DOB:          03/22/1940    TIME:  9 p.m.  PHONE NUMBER:  Her home phone number, which I looked up in the EMR.   OBJECTIVE:  Patient's husband was diagnosed with shingles by another  doctor and was put on Valtrex, which was $184.  The patient has no  insurance.  By the time I spoke with her, she had bought three pills to  start the patient with and was wondering if there was a cheaper  alternative objective to shingles in the plan.  I suggested they call  the office tomorrow and discuss whether there may be samples available.  If not, patient could possibly be switched to acyclovir that might be  cheaper.   PRIMARY CARE Greene Diodato:  Dr. Alwyn Ren   HOME OFFICE:  Laren Boom, MD  Electronically Signed    RNS/MedQ  DD: 01/13/2007  DT: 01/14/2007  Job #: [id number]

## 2010-07-20 NOTE — Assessment & Plan Note (Signed)
[facility]                                 ON-CALL NOTE   NAME:WYMERTabita, [name]                  MRN:          [id number]  DATE:01/13/2007                            DOB:          01-02-1941    TIME OF INTERACTION:  5:26 p.m.   TELEPHONE NUMBER:  [phone] which is obviously wrong, it will not go  through.   OBJECTIVE:  Patient was asking about shingles, was unable to get a hold  of her.  Checked the phone number through the call system which was the  number given, told them to page me with a new number when she calls  back.   PRIMARY CARE Amanda [name]:  Dr. Alwyn Ren, home office is [facility].     Arta Silence, MD  Electronically Signed    RNS/MedQ  DD: 01/13/2007  DT: 01/14/2007  Job #: [id number]

## 2010-07-20 NOTE — Op Note (Signed)
[facility]  Patient:    Amanda [name], Amanda [name]                      MRN: [id number] Proc. Date: 05/25/99 Adm. Date:  [id number] Attending:  Madelyn Flavors CC:         Titus Dubin. Alwyn Ren, M.D.                           Operative Report  PREOPERATIVE DIAGNOSIS:       Postmenopausal bleeding, endometrial polyp.  POSTOPERATIVE DIAGNOSIS:      Postmenopausal bleeding, endometrial polyp.  OPERATION:                    Dilatation and curettage, hysteroscopy.  SURGEON:                      Beather Arbour. Thomasena Edis, M.D.  ASSISTANT:  ANESTHESIA:                   Monitored anesthesia care plus 28 cc of 1% lidocaine paracervical block.  ESTIMATED BLOOD LOSS:         Minimal.  FLUIDS:                       Approximately 600 cc of Crystalloid.  COMPLICATIONS:                None.  DRAINS:                       None.  DESCRIPTION OF PROCEDURE:     The patient was brought to the operating room and  identified on the operating table.  After induction of adequate MAC analgesia with the patient adequately sedated, she was placed in the dorsal lithotomy position and prepped and draped in the usual sterile fashion.  The uterus was noted on bimanual examination to be midposition approximately six weeks size.  The anterior lip of the cervix was infiltrated with 1 cc of 1% lidocaine and grasped with a single tooth tenaculum.  However, the endometrial canal was noted to track posteriorly and the tenaculum was placed to the posterior lip.  Approximately 20 cc of 1% lidocaine were infused for a paracervical block.  It should be noted that prior to starting the procedure, the patients bladder was straight catheterized for approximately 50 cc of clear yellow urine.  The cervix was very gently dilated up to a #25 Pratt  dilator.  The hysteroscope was placed and a very careful and thorough hysteroscopic examination was performed.  The polyp was readily identified.   The hysteroscope was withdrawn and the uterus was systematically curetted in a systematic clockwise fashion with portions of the polyp obtained.  The Randall-stone forceps were placed with additional portions of the polyp obtained.  The hysteroscope was again placed and a portion of the polyp was noted to be remaining and the remainder of the polyp was removed in its entirety as confirmed by follow-up hysteroscopic examination. All portions of the polyp were sent to pathology for examination.  There was noted to be very minimal bleeding.  At that point, the patient did receive IV Toradol for postoperative pain control.  There was noted to be very minimal bleeding and no  bleeding from the tenaculum site.  At that point the procedure was then terminated. The patient tolerated the procedure well  without apparent complications and was  transferred to the recovery room in stable condition after all sponge, needle, nd instrument counts were correct.  The patient was given a postoperative instruction sheet, urged to return to the office in two to three weeks for postoperative examination, and refrain from intercourse or anything in her vagina for two weeks. She is urged to take ibuprofen 400 to 600 mg every six hours as needed for pain. DD:  05/25/99 TD:  05/25/99 Job: [id number]

## 2010-09-10 ENCOUNTER — Ambulatory Visit (INDEPENDENT_AMBULATORY_CARE_PROVIDER_SITE_OTHER): Payer: Medicare Other | Admitting: Internal Medicine

## 2010-09-10 ENCOUNTER — Encounter: Payer: Self-pay | Admitting: Internal Medicine

## 2010-09-10 VITALS — BP 122/80 | HR 60 | Temp 98.0°F | Wt 156.0 lb

## 2010-09-10 DIAGNOSIS — M949 Disorder of cartilage, unspecified: Secondary | ICD-10-CM

## 2010-09-10 DIAGNOSIS — M25562 Pain in left knee: Secondary | ICD-10-CM

## 2010-09-10 DIAGNOSIS — M899 Disorder of bone, unspecified: Secondary | ICD-10-CM

## 2010-09-10 DIAGNOSIS — R05 Cough: Secondary | ICD-10-CM

## 2010-09-10 DIAGNOSIS — R32 Unspecified urinary incontinence: Secondary | ICD-10-CM

## 2010-09-10 DIAGNOSIS — R059 Cough, unspecified: Secondary | ICD-10-CM

## 2010-09-10 DIAGNOSIS — M8580 Other specified disorders of bone density and structure, unspecified site: Secondary | ICD-10-CM

## 2010-09-10 DIAGNOSIS — M25569 Pain in unspecified knee: Secondary | ICD-10-CM

## 2010-09-10 DIAGNOSIS — E039 Hypothyroidism, unspecified: Secondary | ICD-10-CM

## 2010-09-10 MED ORDER — TRAMADOL HCL 50 MG PO TABS: 50.0000 mg | ORAL_TABLET | Freq: Four times a day (QID) | ORAL | Status: DC | PRN

## 2010-09-10 NOTE — Patient Instructions (Signed)
A trial of Advair at 100/50 one inflation  Every  12 hours will be provided. Gargle and spit after use. If symptoms persist; pulmonary function test would be considered.

## 2010-09-10 NOTE — Progress Notes (Signed)
  Subjective:    Patient ID: Amanda [name], female    DOB: 01-Apr-1940, 70 y.o.   MRN: [id number]  HPI #1 Extremity pain Location:l knee Onset:6 weeks Trigger/injury: ? Exacerbated by  CURVES  exercises Pain quality:grinding Pain severity:up to 4 Duration:only with pressure Radiation:no Treatment/response:Glucosamine X 1 month w/o benefit Review of systems: Constitutional: no fever, chills, sweats, change in weight  Musculoskeletal:no  muscle cramps or pain; no  joint stiffness, redness, or swelling Skin:no rash, color change Neuro: no weakness; numbness and tingling Heme:no lymphadenopathy; abnormal bruising or bleeding    #2 Cough Onset:since Pneumonia in 2011 Extrinsic symptoms:itchy eyes, sneezing:no  Infectious symptoms  :fever, purulent secretions : rare yellow Chest symptoms: pain,  hemoptysis,dyspnea,wheezing:no GI symptoms: Dyspepsia, reflux:no Occupational/environmental exposures:no Smoking:never ACE inhibitor:no Treatment/efficacy:cough drops OTC Past medical history/family history pulmonary disease: PGF COPD ( Emphysema)   Review of Systems  She describes urinary incontinence which is aggravated by coughing or laughing for at least 6 months. She states she must keep her bladder emptied. She has not seen her gynecologist for approximately a year. She denies dysuria hematuria or pyuria.  No PMH of TAH.   Objective:   Physical Exam  General appearance is of good health and nourishment; no acute distress or increased work of breathing is present.  No  lymphadenopathy about the head, neck, or axilla noted.   Eyes: No conjunctival inflammation or lid edema is present. There is no scleral icterus.  Ears:  External ear exam shows no significant lesions or deformities.  Otoscopic examination reveals clear canals, tympanic membranes are intact bilaterally without bulging, retraction, inflammation or discharge. TMs scarred inferiorly  Nose:  External nasal examination shows no  deformity or inflammation. Nasal mucosa are pink and moist without lesions or exudates. No septal dislocation or dislocation.No obstruction to airflow.   Oral exam: Dental hygiene is good; lips and gums are healthy appearing.There is no oropharyngeal erythema or exudate noted.   Neck:  No deformities, thyromegaly, masses, or tenderness noted.   Supple with full range of motion   Heart:  Normal rate and regular rhythm. S1 and S2 normal without gallop,  click, rub or other extra sounds.  Grade 1/6 systolic murmur  Lungs:Chest :no  rhonchi,rales ,or rubs present.No increased work of breathing.  Mild expiratory  Bronchovesicular breath sounds  Extremities:  No cyanosis, edema, or clubbing  noted .Mild DIP OA changes. Crepitus of knees ,L > R.   Skin: Warm & dry w/o jaundice or tenting.             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  Plan:#1 Atraumatic exercises were discussed. Tramadol can be taken as needed  #2 pulmonary  function tests are recommended to rule out subclinical reactive airways disease  #3 and #4 should be evaluated by her gynecologist. The incontinence may be related to some prolapse. Her bone density status should be clarified. If it needs to be treated then another agent which should be considered then this bisphosphonates. Vitamin D level should also be verified.

## 2010-09-11 LAB — TSH: TSH: 1.7 u[IU]/mL (ref 0.35–5.50)

## 2010-11-05 ENCOUNTER — Other Ambulatory Visit: Payer: Self-pay | Admitting: Internal Medicine

## 2010-11-12 ENCOUNTER — Telehealth: Payer: Self-pay | Admitting: *Deleted

## 2010-11-12 NOTE — Telephone Encounter (Signed)
Pt will be new pt will dr.fernandez and wanted to know if she could get her screening mammogram done at the [facility]. Pt informed that this is okay.

## 2010-11-16 ENCOUNTER — Telehealth: Payer: Self-pay

## 2010-11-16 DIAGNOSIS — R0602 Shortness of breath: Secondary | ICD-10-CM | POA: Insufficient documentation

## 2010-11-16 DIAGNOSIS — R5381 Other malaise: Secondary | ICD-10-CM | POA: Insufficient documentation

## 2010-11-16 NOTE — ED Notes (Signed)
States she feels tired. Has had a cough x 1 year and never had a cxr that her MD recommended. She feels her voice has become raspy over the past couple of months.

## 2010-11-16 NOTE — Telephone Encounter (Signed)
Patient called triage line indicating yesterday she had an episode where she felt as if she was going to pass out, dizzy,light-headed, almost vomited (was able to control). Patient stated it took her 20 min to get back to feeling normal. Patient was fine at the time of call and debated calling  I informed patient to seek medical attention to r/u heart related , stroke or any other potential serious conditions . Patient stated she was in route back home from the beach and unable to get medical attention now but would once she gets home. I informed patient that we prefer [facility] if she is going to wait til she gets back here, if another facility is of more convience for her to please obtain records of all test performed and visit data. Patient agreed , also schedule appointment for Monday if patient told to f/u with primary (appointment will already be in place), patient to call and cancel if not needed

## 2010-11-16 NOTE — ED Notes (Signed)
Weakness while walking up steps at the beach yesterday. Pain in her legs. Today pain in her legs has gone away but she continues to feel weak. Nausea yesterday.

## 2010-11-17 ENCOUNTER — Emergency Department (HOSPITAL_BASED_OUTPATIENT_CLINIC_OR_DEPARTMENT_OTHER): Payer: Medicare Other

## 2010-11-17 ENCOUNTER — Encounter (HOSPITAL_BASED_OUTPATIENT_CLINIC_OR_DEPARTMENT_OTHER): Payer: Self-pay

## 2010-11-17 ENCOUNTER — Emergency Department (HOSPITAL_BASED_OUTPATIENT_CLINIC_OR_DEPARTMENT_OTHER)
Admission: EM | Admit: 2010-11-17 | Discharge: 2010-11-17 | Disposition: A | Discharge status: Home / Self Care | Payer: Medicare Other | Attending: Emergency Medicine | Admitting: Emergency Medicine

## 2010-11-17 ENCOUNTER — Other Ambulatory Visit: Payer: Self-pay

## 2010-11-17 ENCOUNTER — Emergency Department (INDEPENDENT_AMBULATORY_CARE_PROVIDER_SITE_OTHER): Payer: Medicare Other

## 2010-11-17 DIAGNOSIS — R0602 Shortness of breath: Secondary | ICD-10-CM

## 2010-11-17 DIAGNOSIS — R5383 Other fatigue: Secondary | ICD-10-CM

## 2010-11-17 DIAGNOSIS — R05 Cough: Secondary | ICD-10-CM

## 2010-11-17 DIAGNOSIS — R110 Nausea: Secondary | ICD-10-CM

## 2010-11-17 LAB — CBC
MCV: 80.8 fL (ref 78.0–100.0)
Platelets: 233 10*3/uL (ref 150–400)
RDW: 13.9 % (ref 11.5–15.5)
WBC: 4.2 10*3/uL (ref 4.0–10.5)

## 2010-11-17 LAB — URINALYSIS, ROUTINE W REFLEX MICROSCOPIC
Glucose, UA: NEGATIVE mg/dL
Protein, ur: NEGATIVE mg/dL
Urobilinogen, UA: 0.2 mg/dL (ref 0.0–1.0)

## 2010-11-17 LAB — BASIC METABOLIC PANEL
CO2: 29 mEq/L (ref 19–32)
Calcium: 9.6 mg/dL (ref 8.4–10.5)
Sodium: 141 mEq/L (ref 135–145)

## 2010-11-17 LAB — URINE MICROSCOPIC-ADD ON

## 2010-11-17 LAB — DIFFERENTIAL
Basophils Absolute: 0 10*3/uL (ref 0.0–0.1)
Eosinophils Relative: 4 % (ref 0–5)
Lymphocytes Relative: 31 % (ref 12–46)
Neutro Abs: 2.1 10*3/uL (ref 1.7–7.7)

## 2010-11-17 NOTE — ED Provider Notes (Signed)
History     CSN: [id number] Arrival date & time: 11/17/2010 12:46 AM   Chief Complaint  Patient presents with  . Weakness     (Include location/radiation/quality/duration/timing/severity/associated sxs/prior treatment) Patient is a 70 y.o. female presenting with weakness. The history is provided by the spouse.  Weakness The primary symptoms include nausea. Primary symptoms do not include headaches or fever. Episode onset: 2 days ago. The episode lasted 20 minutes. The symptoms are resolved. Context: after walking up an incline at the beach.  Additional symptoms include weakness and leg pain. Additional symptoms do not include neck stiffness. Medical issues do not include cerebral vascular accident or cancer.   Was at the beach 2 days ago and after being there a few hours, walked up an incline thru the sand and then 2 flights of stairs, she became pale, weak, very SOB and had some posterior thigh pains. She sat down and symptoms resolved. No CP or syncope. She is scheduled to see her PCP on Monday but wanted to be evaluated sooner.   No past medical history on file.   No past surgical history on file.  No family history on file.  History  Substance Use Topics  . Smoking status: Never Smoker   . Smokeless tobacco: Not on file  . Alcohol Use: Yes     Redwine    OB History    Grav Para Term Preterm Abortions TAB SAB Ect Mult Living                  Review of Systems  Constitutional: Negative for fever and chills.  HENT: Negative for neck pain and neck stiffness.   Eyes: Negative for pain.  Respiratory: Positive for shortness of breath.   Cardiovascular: Negative for chest pain, palpitations and leg swelling.  Gastrointestinal: Positive for nausea. Negative for abdominal pain.  Genitourinary: Negative for dysuria.  Musculoskeletal: Negative for back pain.  Skin: Negative for rash.  Neurological: Positive for weakness. Negative for numbness and headaches.  All other  systems reviewed and are negative.    Allergies  Levofloxacin and Pravastatin sodium  Home Medications   Current Outpatient Rx  Name Route Sig Dispense Refill  . ALENDRONATE SODIUM 70 MG PO TABS Oral Take 70 mg by mouth every 7 (seven) days. Take with a full glass of water on an empty stomach.     [name] GLUCOSAMINE CHONDR COMPLEX PO Oral Take by mouth daily.      [name] LEVOTHYROXINE SODIUM 100 MCG PO TABS  TAKE 1 AND 1/2 TABLETS BY MOUTH ON WEDNESDAY AND 1 TABLET ALL OTHER DAYS 135 tablet 0  . TRAMADOL HCL 50 MG PO TABS Oral Take 1 tablet (50 mg total) by mouth every 6 (six) hours as needed for pain. 30 tablet 0    Physical Exam    BP 175/87  Pulse 67  Temp(Src) 97.1 F (36.2 C) (Oral)  Resp 16  SpO2 95%  Physical Exam  Constitutional: She is oriented to person, place, and time. She appears well-developed and well-nourished.  HENT:  Head: Normocephalic and atraumatic.  Eyes: Conjunctivae and EOM are normal. Pupils are equal, round, and reactive to light.  Neck: Trachea normal. Neck supple. No thyromegaly present.  Cardiovascular: Normal rate, regular rhythm, S1 normal, S2 normal and normal pulses.     No systolic murmur is present   No diastolic murmur is present  Pulses:      Radial pulses are 2+ on the right side, and 2+ on the  left side.  Pulmonary/Chest: Effort normal and breath sounds normal. She has no wheezes. She has no rhonchi. She has no rales. She exhibits no tenderness.  Abdominal: Soft. Normal appearance and bowel sounds are normal. There is no tenderness. There is no CVA tenderness and negative Murphy's sign.  Musculoskeletal:       BLE:s Calves nontender, no cords or erythema, negative Homans sign  Neurological: She is alert and oriented to person, place, and time. She has normal strength. No cranial nerve deficit or sensory deficit. GCS eye subscore is 4. GCS verbal subscore is 5. GCS motor subscore is 6.  Skin: Skin is warm and dry. No rash noted. She is not  diaphoretic.  Psychiatric: Her speech is normal.       Cooperative and appropriate    ED Course  Procedures  Results for orders placed during the hospital encounter of 11/17/10  CBC      Component Value Range   WBC 4.2  4.0 - 10.5 (K/uL)   RBC 4.64  3.87 - 5.11 (MIL/uL)   Hemoglobin 12.7  12.0 - 15.0 (g/dL)   HCT 16.1  09.6 - 04.5 (%)   MCV 80.8  78.0 - 100.0 (fL)   MCH 27.4  26.0 - 34.0 (pg)   MCHC 33.9  30.0 - 36.0 (g/dL)   RDW 40.9  81.1 - 91.4 (%)   Platelets 233  150 - 400 (K/uL)  DIFFERENTIAL      Component Value Range   Neutrophils Relative 50  43 - 77 (%)   Neutro Abs 2.1  1.7 - 7.7 (K/uL)   Lymphocytes Relative 31  12 - 46 (%)   Lymphs Abs 1.3  0.7 - 4.0 (K/uL)   Monocytes Relative 14 (*) 3 - 12 (%)   Monocytes Absolute 0.6  0.1 - 1.0 (K/uL)   Eosinophils Relative 4  0 - 5 (%)   Eosinophils Absolute 0.2  0.0 - 0.7 (K/uL)   Basophils Relative 1  0 - 1 (%)   Basophils Absolute 0.0  0.0 - 0.1 (K/uL)  BASIC METABOLIC PANEL      Component Value Range   Sodium 141  135 - 145 (mEq/L)   Potassium 3.8  3.5 - 5.1 (mEq/L)   Chloride 103  96 - 112 (mEq/L)   CO2 29  19 - 32 (mEq/L)   Glucose, Bld 102 (*) 70 - 99 (mg/dL)   BUN 21  6 - 23 (mg/dL)   Creatinine, Ser 7.82  0.50 - 1.10 (mg/dL)   Calcium 9.6  8.4 - 95.6 (mg/dL)   GFR calc non Af Amer >60  >60 (mL/min)   GFR calc Af Amer >60  >60 (mL/min)  URINALYSIS, ROUTINE W REFLEX MICROSCOPIC      Component Value Range   Color, Urine YELLOW  YELLOW    Appearance CLEAR  CLEAR    Specific Gravity, Urine 1.009  1.005 - 1.030    pH 7.0  5.0 - 8.0    Glucose, UA NEGATIVE  NEGATIVE (mg/dL)   Hgb urine dipstick NEGATIVE  NEGATIVE    Bilirubin Urine NEGATIVE  NEGATIVE    Ketones, ur NEGATIVE  NEGATIVE (mg/dL)   Protein, ur NEGATIVE  NEGATIVE (mg/dL)   Urobilinogen, UA 0.2  0.0 - 1.0 (mg/dL)   Nitrite NEGATIVE  NEGATIVE    Leukocytes, UA SMALL (*) NEGATIVE   URINE MICROSCOPIC-ADD ON      Component Value Range   Squamous  Epithelial / LPF RARE  RARE  WBC, UA 3-6  <3 (WBC/hpf)   RBC / HPF 0-2  <3 (RBC/hpf)   Bacteria, UA RARE  RARE    Dg Chest 2 View  11/17/2010  *RADIOLOGY REPORT*  Clinical Data: Cough and malaise; weakness.  Nausea.  CHEST - 2 VIEW  Comparison: Chest radiograph performed 10/05/2009  Findings: The lungs are mildly hyperexpanded, with flattening of the hemidiaphragms, likely reflecting COPD.  Mild bibasilar atelectasis is noted.  There is no evidence of pleural effusion or pneumothorax.  The heart is normal in size; the mediastinal contour is within normal limits.  No acute osseous abnormalities are seen.  IMPRESSION: Findings suggest COPD; mild bibasilar atelectasis noted.  Original Report Authenticated By: Tonia Ghent, M.D.    Date: 11/17/2010  Rate: 64  Rhythm: normal sinus rhythm  QRS Axis: normal  Intervals: normal  ST/T Wave abnormalities: nonspecific ST changes  Conduction Disutrbances:none  Narrative Interpretation:   Old EKG Reviewed: none available    DX: SOB   MDM  Asymptomatic since event. CXR reviewed - never smoked and had pulmonary eval about 20 years ago- since then she can only walk a few stairs and has to stop and that has been unchanged - this event was much more severe than her typical symptoms. Based on CXR findings PT should have PFTs. Although no risk factors for ACS, would also benefit from stress testing. I offered specialist referrals but PT prefers to see PCP Monday to get referral -she agrees to f/u plan.        Sunnie Nielsen, MD 11/17/10 [phone]

## 2010-11-19 ENCOUNTER — Other Ambulatory Visit: Payer: Self-pay | Admitting: Gynecology

## 2010-11-19 ENCOUNTER — Ambulatory Visit (INDEPENDENT_AMBULATORY_CARE_PROVIDER_SITE_OTHER): Payer: Medicare Other | Admitting: Internal Medicine

## 2010-11-19 ENCOUNTER — Encounter: Payer: Self-pay | Admitting: Internal Medicine

## 2010-11-19 DIAGNOSIS — R110 Nausea: Secondary | ICD-10-CM

## 2010-11-19 DIAGNOSIS — R5381 Other malaise: Secondary | ICD-10-CM

## 2010-11-19 DIAGNOSIS — R0989 Other specified symptoms and signs involving the circulatory and respiratory systems: Secondary | ICD-10-CM

## 2010-11-19 DIAGNOSIS — R531 Weakness: Secondary | ICD-10-CM

## 2010-11-19 NOTE — Patient Instructions (Signed)
Please  schedule fasting advanced Lipid testing (272.4)

## 2010-11-19 NOTE — Progress Notes (Signed)
  Subjective:    Patient ID: Amanda [name], female    DOB: 1940-07-04, 70 y.o.   MRN: [id number]  HPI  On  9/13 at the beach she experienced weakness, nausea, and pain in her legs posteriorly after walking on soft sand and climbing stairs carrying beach equipment. Although the symptoms resolved; she was seen at the [facility] on [address] on 9:15. All studies were normal except for mildly elevated glucose of 102. No cardiac enzymes were done; she was not having symptoms at that time. EKG was normal.  Her concern is that the CXray revealed hyperexpansion of the lung fields and flattened diaphragms suggesting COPD. She had mild bibasilar atelectasis.  She exercises 2-3 times a week at curves and also in water activities 2-3 times a week without symptoms.  There is no family history of coronary disease.  She has never smoked. The only personal or family history of lung disease includes emphysema in her PGF who worked  in a [employer] engineer    Review of Systems she describes a nonproductive cough intermittently since her pneumonia.      Objective:   Physical Exam General appearance is one of good health and nourishment w/o distress.  Eyes: No conjunctival inflammation or scleral icterus is present.  Oral exam: Dental hygiene is good; lips and gums are healthy appearing.There is no oropharyngeal erythema or exudate noted.   Heart:  Normal rate and regular rhythm. S1 and S2 normal without gallop,  click, rub or other extra sounds  . Grade 1/6 systolic murmur   Lungs:Chest clear to auscultation; no wheezes, rhonchi,rales ,or rubs present.No increased work of breathing.   Abdomen: bowel sounds normal, soft and non-tender without masses, organomegaly or hernias noted.  No guarding or rebound   Skin:Warm & dry.  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 She has no history of lung disease and is a nonsmoker  #3 dyslipidemia; off statin due to GI intolerance  #4 cough, rule out reactive airways disease  Plan:#1 Treadmill stress test to rule out ischemia  #2 Advanced cholesterol testing is indicated to establish her risk related to lipids  #3 Pulmonary  function test (complete spirometry) should be performed if 1 and 2 are negative.  #4 trial of Asmanex over 7 days for  the cough

## 2010-11-21 ENCOUNTER — Other Ambulatory Visit: Payer: Self-pay | Admitting: Internal Medicine

## 2010-11-21 ENCOUNTER — Telehealth: Payer: Self-pay | Admitting: *Deleted

## 2010-11-21 DIAGNOSIS — E785 Hyperlipidemia, unspecified: Secondary | ICD-10-CM

## 2010-11-21 NOTE — Telephone Encounter (Signed)
Pt is requesting that Dr hopper compare X-ray from last year to the one she had done last week. Pt states that she was advised that her lung were of a 20 year smoker. Pt notes that she has never smoke nor is she exposed to second hand smoke. Please advise

## 2010-11-21 NOTE — Telephone Encounter (Signed)
As per Dr hopper x-ray showed pseudo emphysema as we discussed at previous OV no acute process. PFT if cardiac evaluation is negative. Pt advise and Ok info, and stated that she wanted to make sure Dr hopper was aware of x-ray results.

## 2010-11-22 ENCOUNTER — Other Ambulatory Visit (INDEPENDENT_AMBULATORY_CARE_PROVIDER_SITE_OTHER): Payer: Medicare Other

## 2010-11-22 DIAGNOSIS — E785 Hyperlipidemia, unspecified: Secondary | ICD-10-CM

## 2010-11-22 LAB — LDL CHOLESTEROL, DIRECT: Direct LDL: 142.8 mg/dL

## 2010-11-22 LAB — LIPID PANEL
Total CHOL/HDL Ratio: 4
VLDL: 14.2 mg/dL (ref 0.0–40.0)

## 2010-11-22 NOTE — Progress Notes (Signed)
Labs only

## 2010-12-03 ENCOUNTER — Ambulatory Visit (INDEPENDENT_AMBULATORY_CARE_PROVIDER_SITE_OTHER): Payer: Medicare Other | Admitting: Physician Assistant

## 2010-12-03 DIAGNOSIS — R531 Weakness: Secondary | ICD-10-CM

## 2010-12-03 DIAGNOSIS — R0602 Shortness of breath: Secondary | ICD-10-CM

## 2010-12-03 DIAGNOSIS — R110 Nausea: Secondary | ICD-10-CM

## 2010-12-03 DIAGNOSIS — R079 Chest pain, unspecified: Secondary | ICD-10-CM

## 2010-12-03 NOTE — Patient Instructions (Signed)
Your physician has requested that you have en exercise stress myoview DX 786.05 SOB AND ABNORMAL STRESS TEST. For further information please visit [email or web address] Please follow instruction sheet, as given.

## 2010-12-03 NOTE — Progress Notes (Signed)
Exercise Treadmill Test  Pre-Exercise Testing Evaluation Rhythm: sinus bradycardia  Rate: 56   PR:  .17 QRS:  .08  QT:  .43 QTc: .41     Test  Exercise Tolerance Test Ordering MD: Marga Melnick M.D  Interpreting MD:  Tereso Newcomer PA-C  Unique Test No: 1  Treadmill:  1  Indication for ETT: exertional dyspnea  Contraindication to ETT: No   Stress Modality: exercise - treadmill  Cardiac Imaging Performed: non   Protocol: standard Bruce - maximal  Max BP:  211/66  Max MPHR (bpm):  149 85% MPR (bpm):  126  MPHR obtained (bpm):  134 % MPHR obtained:  90  Reached 85% MPHR (min:sec):  4:27 Total Exercise Time (min-sec): 5:41  Workload in METS:7.0 Borg Scale: 16  Reason ETT Terminated:  patient's desire to stop    ST Segment Analysis At Rest: normal ST segments - no evidence of significant ST depression With Exercise: borderline ST changes  Other Information Arrhythmia:  No Angina during ETT:  absent (0) Quality of ETT:  indeterminate  ETT Interpretation:  borderline (indeterminate) with non-specific ST changes  Comments: Fair exercise tolerance. No chest pain.  Patient with significant dyspnea during exercise.  Also, noted leg pain. Normal BP response to exercise. Borderline ST-T changes during exercise, cannot rule out ischemia.   Recommendations: Schedule ETT-Myoview. Consider ABIs; per PCP.

## 2010-12-06 ENCOUNTER — Encounter: Payer: Self-pay | Admitting: Internal Medicine

## 2010-12-17 ENCOUNTER — Ambulatory Visit (HOSPITAL_COMMUNITY): Payer: Medicare Other | Attending: Internal Medicine | Admitting: Radiology

## 2010-12-17 DIAGNOSIS — R9439 Abnormal result of other cardiovascular function study: Secondary | ICD-10-CM

## 2010-12-17 DIAGNOSIS — R5383 Other fatigue: Secondary | ICD-10-CM | POA: Insufficient documentation

## 2010-12-17 DIAGNOSIS — R0989 Other specified symptoms and signs involving the circulatory and respiratory systems: Secondary | ICD-10-CM

## 2010-12-17 DIAGNOSIS — R5381 Other malaise: Secondary | ICD-10-CM | POA: Insufficient documentation

## 2010-12-17 DIAGNOSIS — R531 Weakness: Secondary | ICD-10-CM

## 2010-12-17 DIAGNOSIS — R110 Nausea: Secondary | ICD-10-CM | POA: Insufficient documentation

## 2010-12-17 DIAGNOSIS — R0609 Other forms of dyspnea: Secondary | ICD-10-CM

## 2010-12-17 DIAGNOSIS — R0789 Other chest pain: Secondary | ICD-10-CM

## 2010-12-17 MED ORDER — TECHNETIUM TC 99M TETROFOSMIN IV KIT
11.0000 | PACK | Freq: Once | INTRAVENOUS | Status: AC | PRN
  Administered 2010-12-17: 11 via INTRAVENOUS

## 2010-12-17 MED ORDER — TECHNETIUM TC 99M TETROFOSMIN IV KIT
33.0000 | PACK | Freq: Once | INTRAVENOUS | Status: AC | PRN
  Administered 2010-12-17: 33 via INTRAVENOUS

## 2010-12-17 NOTE — Progress Notes (Signed)
[facility] SITE 3 NUCLEAR MED [address] [place] [address] [phone]  Cardiology Nuclear Med Study  Amanda [name] is a 70 y.o. female [id number] 1940/05/08   Nuclear Med Background Indication for Stress Test:  Evaluation for Ischemia and 12/03/10 GXT: Borderline ST changes History: 12/03/10 ION:[id number] ST-T wave changes Cardiac Risk Factors: Lipids  Symptoms:  Chest Tightness with and without Exertion (last episode of chest discomfort was mid September), Diaphoresis, Dizziness, DOE, Fatigue, Nausea and Near Syncope   Nuclear Pre-Procedure Caffeine/Decaff Intake:  None NPO After: 10:00pm   Lungs:  Clear  IV 0.9% NS with Angio Cath:  22g  IV Site: R Hand x 1, tolerated well IV Started by:  Irean Hong, RN  Chest Size (in):  36 Cup Size: B  Height: 5\' 2"  (1.575 m)  Weight:  153 lb (69.4 kg)  BMI:  Body mass index is 27.98 kg/(m^2). Tech Comments:  n/a    Nuclear Med Study 1 or 2 day study: 1 day  Stress Test Type:  Stress  Reading MD: Olga Millers, MD  Order Authorizing Provider:  Dietrich Pates, MD, Tereso Newcomer, PAC, Marga Melnick, MD  Resting Radionuclide: Technetium 9m Tetrofosmin  Resting Radionuclide Dose: 11 mCi   Stress Radionuclide:  Technetium 45m Tetrofosmin  Stress Radionuclide Dose: 33 mCi           Stress Protocol Rest HR: 57 Stress HR: 139  Rest BP: 137/83 Stress BP: 213/78  Exercise Time (min): 7:16 METS: 8.9   Predicted Max HR: 149 bpm % Max HR: 93.29 bpm Rate Pressure Product: 40102   Dose of Adenosine (mg):  n/a Dose of Lexiscan: n/a mg  Dose of Atropine (mg): n/a Dose of Dobutamine: n/a mcg/kg/min (at max HR)  Stress Test Technologist: Smiley Houseman, CMA-N  Nuclear Technologist:  Domenic Polite, CNMT     Rest Procedure:  Myocardial perfusion imaging was performed at rest 45 minutes following the intravenous administration of Technetium 85m Tetrofosmin.  Rest ECG: No acute changes.  Stress Procedure:  The patient  exercised for 7:16 on the treadmill utilizing the Bruce protocol.  The patient stopped due to fatigue and dyspnea. She denied any chest pain.  There were nonspecific ST-T wave changes and rare PAC's.  She had a hypertensive response after 3-minutes of exercise, 213/78, but after that her BP began to drop, 205/80 and 198/75 at peak exercise.  Technetium 97m Tetrofosmin was injected at peak exercise and myocardial perfusion imaging was performed after a brief delay.  Stress ECG: Insignificant upsloping ST segment depression.  QPS Raw Data Images:  Acquisition technically good; normal left ventricular size. Stress Images:  There is decreased uptake in the apex. Rest Images:  There is decreased uptake in the apex. Subtraction (SDS):  No evidence of ischemia. Transient Ischemic Dilatation (Normal <1.22):  .97 Lung/Heart Ratio (Normal <0.45):  .25  Quantitative Gated Spect Images QGS EDV:  70 ml QGS ESV:  21 ml QGS cine images:  NL LV Function; NL Wall Motion QGS EF: 70%  Impression Exercise Capacity:  Fair exercise capacity. BP Response:  Hypertensive blood pressure response. Clinical Symptoms:  No chest pain. ECG Impression:  Insignificant upsloping ST segment depression. Comparison with Prior Nuclear Study: No previous nuclear study performed  Overall Impression:  Probably normal stress nuclear study with a small nonreversible apical defect suggestive of soft tissue attenuation but no ischemia.  Olga Millers

## 2010-12-25 ENCOUNTER — Ambulatory Visit: Payer: Medicare Other | Admitting: Gynecology

## 2011-02-03 ENCOUNTER — Other Ambulatory Visit: Payer: Self-pay | Admitting: Internal Medicine

## 2011-03-05 DIAGNOSIS — H3530 Unspecified macular degeneration: Secondary | ICD-10-CM

## 2011-03-05 HISTORY — DX: Unspecified macular degeneration: H35.30

## 2011-03-18 ENCOUNTER — Encounter: Payer: Self-pay | Admitting: Gynecology

## 2011-03-18 ENCOUNTER — Ambulatory Visit (INDEPENDENT_AMBULATORY_CARE_PROVIDER_SITE_OTHER): Payer: Medicare Other | Admitting: Gynecology

## 2011-03-18 DIAGNOSIS — E039 Hypothyroidism, unspecified: Secondary | ICD-10-CM | POA: Insufficient documentation

## 2011-03-18 DIAGNOSIS — Z1211 Encounter for screening for malignant neoplasm of colon: Secondary | ICD-10-CM

## 2011-03-18 DIAGNOSIS — M810 Age-related osteoporosis without current pathological fracture: Secondary | ICD-10-CM

## 2011-03-18 DIAGNOSIS — Z01419 Encounter for gynecological examination (general) (routine) without abnormal findings: Secondary | ICD-10-CM

## 2011-03-18 DIAGNOSIS — N760 Acute vaginitis: Secondary | ICD-10-CM

## 2011-03-18 DIAGNOSIS — N952 Postmenopausal atrophic vaginitis: Secondary | ICD-10-CM | POA: Diagnosis not present

## 2011-03-18 LAB — WET PREP FOR TRICH, YEAST, CLUE: Yeast Wet Prep HPF POC: NONE SEEN

## 2011-03-18 MED ORDER — NONFORMULARY OR COMPOUNDED ITEM: Status: DC

## 2011-03-18 NOTE — Patient Instructions (Signed)
Conjugated Estrogens vaginal cream What is this medicine? CONJUGATED ESTROGENS (CON ju gate ed ESS troe jenz) are a mixture of female hormones. This cream can help relieve symptoms associated with menopause.like vaginal dryness and irritation. This medicine may be used for other purposes; ask your health care provider or pharmacist if you have questions. What should I tell my health care provider before I take this medicine? They need to know if you have any of these conditions: -abnormal vaginal bleeding -blood vessel disease or blood clots -breast, cervical, endometrial, or uterine cancer -dementia -diabetes -gallbladder disease -heart disease or recent heart attack -high blood pressure -high cholesterol -high level of calcium in the blood -hysterectomy -kidney disease -liver disease -migraine headaches -protein C deficiency -protein S deficiency -stroke -systemic lupus erythematosus (SLE) -tobacco smoker -an unusual or allergic reaction to estrogens other medicines, foods, dyes, or preservatives -pregnant or trying to get pregnant -breast-feeding How should I use this medicine? This medicine is for use in the vagina only. Do not take by mouth. Follow the directions on the prescription label. Use at bedtime unless otherwise directed by your doctor or health care professional. Use the special applicator supplied with the cream. Wash hands before and after use. Fill the applicator with the cream and remove from the tube. Lie on your back, part and bend your knees. Insert the applicator into the vagina and push the plunger to expel the cream into the vagina. Wash the applicator with warm soapy water and rinse well. Use exactly as directed for the complete length of time prescribed. Do not stop using except on the advice of your doctor or health care professional. Talk to your pediatrician regarding the use of this medicine in children. Special care may be needed. A patient package  insert for the product will be given with each prescription and refill. Read this sheet carefully each time. The sheet may change frequently. Overdosage: If you think you have taken too much of this medicine contact a poison control center or emergency room at once. NOTE: This medicine is only for you. Do not share this medicine with others. What if I miss a dose? If you miss a dose, use it as soon as you can. If it is almost time for your next dose, use only that dose. Do not use double or extra doses. What may interact with this medicine? Do not take this medicine with any of the following medications: -aromatase inhibitors like aminoglutethimide, anastrozole, exemestane, letrozole, testolactone This medicine may also interact with the following medications: -barbiturates used for inducing sleep or treating seizures -carbamazepine -grapefruit juice -medicines for fungal infections like itraconazole and ketoconazole -raloxifene or tamoxifen -rifabutin -rifampin -rifapentine -ritonavir -some antibiotics used to treat infections -St. John's Wort -warfarin This list may not describe all possible interactions. Give your health care provider a list of all the medicines, herbs, non-prescription drugs, or dietary supplements you use. Also tell them if you smoke, drink alcohol, or use illegal drugs. Some items may interact with your medicine. What should I watch for while using this medicine? Visit your health care professional for regular checks on your progress. You will need a regular breast and pelvic exam. You should also discuss the need for regular mammograms with your health care professional, and follow his or her guidelines. This medicine can make your body retain fluid, making your fingers, hands, or ankles swell. Your blood pressure can go up. Contact your doctor or health care professional if you feel you are retaining   fluid. If you have any reason to think you are pregnant; stop  taking this medicine at once and contact your doctor or health care professional. Tobacco smoking increases the risk of getting a blood clot or having a stroke, especially if you are more than 73 years old. You are strongly advised not to smoke. If you wear contact lenses and notice visual changes, or if the lenses begin to feel uncomfortable, consult your eye care specialist. If you are going to have elective surgery, you may need to stop taking this medicine beforehand. Consult your health care professional for advice prior to scheduling the surgery. What side effects may I notice from receiving this medicine? Side effects that you should report to your doctor or health care professional as soon as possible: -allergic reactions like skin rash, itching or hives, swelling of the face, lips, or tongue -breast tissue changes or discharge -changes in vision -chest pain -confusion, trouble speaking or understanding -dark urine -general ill feeling or flu-like symptoms -light-colored stools -nausea, vomiting -pain, swelling, warmth in the leg -right upper belly pain -severe headaches -shortness of breath -sudden numbness or weakness of the face, arm or leg -trouble walking, dizziness, loss of balance or coordination -unusual vaginal bleeding -yellowing of the eyes or skin Side effects that usually do not require medical attention (report to your doctor or health care professional if they continue or are bothersome): -hair loss -increased hunger or thirst -increased urination -symptoms of vaginal infection like itching, irritation or unusual discharge -unusually weak or tired This list may not describe all possible side effects. Call your doctor for medical advice about side effects. You may report side effects to FDA at 1-800-FDA-1088. Where should I keep my medicine? Keep out of the reach of children. Store at room temperature between 15 and 30 degrees C (59 and 86 degrees F). Throw away  any unused medicine after the expiration date. NOTE: This sheet is a summary. It may not cover all possible information. If you have questions about this medicine, talk to your doctor, pharmacist, or health care provider.  2012, Elsevier/Gold Standard. (05/23/2010 9:20:36 AM) 

## 2011-03-18 NOTE — Progress Notes (Signed)
Amanda [name] [birth year] [id number]   History:    71 y.o.  for annual exam who was being followed by another provider and has not been seen for 2 years. Review of her record indicated she has suffer from vaginal dryness and irritation/atrophy. She has history of osteoporosis had been on Fosamax close to 6 years and stopped at 2 years ago and currently on a drug-free holiday. Her last bone density study November 2010 right femoral neck -2.4 and there had been a significant increase in abdominal density and her AP spine of both hips were otherwise unchanged. She is taking calcium and vitamin D. No prior history of abnormal Pap smear her mammogram was August 2011 she'll be scheduling soon. Her primary physician's been Dr. Alwyn Ren is been treating her for hypercholesterolemia and hypothyroidism. Colonoscopy 7 years ago reported to be normal. Mother with history of breast and ovarian cancer.  Past medical history,surgical history, family history and social history were all reviewed and documented in the EPIC chart.  Gynecologic History No LMP recorded. Patient is postmenopausal. Contraception: none Last Pap: 2010. Results were: normal Last mammogram: 2011. Results were: normal  Obstetric History OB History    Grav Para Term Preterm Abortions TAB SAB Ect Mult Living   4 4  3      3      # Outc Date GA Lbr Len/2nd Wgt Sex Del Anes PTL Lv   1 PRE     F SVD  No Yes   2 PRE     M SVD  Yes Yes   3 PRE     F SVD  Yes Yes   4 PAR     M CS   SB       ROS:  Was performed and pertinent positives and negatives are included in the history.  Exam: chaperone present  BP 142/88  Ht 5' 1.5" (1.562 m)  Wt 155 lb (70.308 kg)  BMI 28.81 kg/m2  Body mass index is 28.81 kg/(m^2).  General appearance : Well developed well nourished female. No acute distress HEENT: Neck supple, trachea midline, no carotid bruits, no thyroidmegaly Lungs: Clear to auscultation, no rhonchi or wheezes, or rib retractions  Heart:  Regular rate and rhythm, no murmurs or gallops Breast:Examined in sitting and supine position were symmetrical in appearance, no palpable masses or tenderness,  no skin retraction, no nipple inversion, no nipple discharge, no skin discoloration, no axillary or supraclavicular lymphadenopathy Abdomen: no palpable masses or tenderness, no rebound or guarding Extremities: no edema or skin discoloration or tenderness  Pelvic:  Bartholin, Urethra, Skene Glands: Within normal limits             Vagina: No gross lesions or discharge atrophic changes  Cervix: No gross lesions or discharge  Uterus  axial, normal size, shape and consistency, non-tender and mobile  Adnexa  Without masses or tenderness  Anus and perineum  normal   Rectovaginal  normal sphincter tone without palpated masses or tenderness             Hemoccult done result pending at time of this dictation     Assessment/Plan:  70 y.o. female for annual exam with evidence of vaginal atrophy since she was having vulvovaginitis we had done a wet prep which was negative. 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 We discussed about putting her on estrogen cream to apply 2 times a week. The risks benefits and pros and cons were discussed. She is to schedule  her mammogram. Since she is over 68 years of age and no prior history of abnormal Pap smear she will no longer need Pap smear according to the new guidelines. We'll check her vitamin D level today. And she will followup with Dr. Alwyn Ren for the lab work the next couple weeks. We'll have her scheduled for bone density study in the next couple weeks. Will notify does any abnormality on the fecal occult blood testing.    Ok Edwards MD, 4:07 PM 03/18/2011

## 2011-03-19 ENCOUNTER — Other Ambulatory Visit: Payer: Self-pay | Admitting: Gynecology

## 2011-03-19 DIAGNOSIS — Z1231 Encounter for screening mammogram for malignant neoplasm of breast: Secondary | ICD-10-CM

## 2011-03-21 ENCOUNTER — Encounter: Payer: Self-pay | Admitting: Internal Medicine

## 2011-03-21 ENCOUNTER — Ambulatory Visit (INDEPENDENT_AMBULATORY_CARE_PROVIDER_SITE_OTHER): Payer: Medicare Other | Admitting: Internal Medicine

## 2011-03-21 VITALS — BP 120/76 | HR 65 | Wt 156.0 lb

## 2011-03-21 DIAGNOSIS — H5710 Ocular pain, unspecified eye: Secondary | ICD-10-CM | POA: Diagnosis not present

## 2011-03-21 DIAGNOSIS — E785 Hyperlipidemia, unspecified: Secondary | ICD-10-CM | POA: Diagnosis not present

## 2011-03-21 DIAGNOSIS — H43399 Other vitreous opacities, unspecified eye: Secondary | ICD-10-CM | POA: Diagnosis not present

## 2011-03-21 DIAGNOSIS — H40019 Open angle with borderline findings, low risk, unspecified eye: Secondary | ICD-10-CM | POA: Diagnosis not present

## 2011-03-21 DIAGNOSIS — H43819 Vitreous degeneration, unspecified eye: Secondary | ICD-10-CM | POA: Diagnosis not present

## 2011-03-21 NOTE — Progress Notes (Signed)
  Subjective:    Patient ID: Amanda [name], female    DOB: 1940-09-24, 71 y.o.   MRN: [id number]  HPI Dyslipidemia assessment: Prior Advanced Lipid Testing: HDL 11/22/10.   Family history of premature CAD/ MI: none .  Nutrition: heart hrealthy . She has Dr Gildardo Griffes book Exercise: 2-3X/ week CURVES or Y . Diabetes : no . HTN: no. Smoking history  : no .     Lab results reviewed : LDL 133 (1779/356), HDL 64, TG 83.CRP 5.5; Lp PLA 2 215(< 200); Genotype 3/3; not hyperabsorber. LDL goal = , 100, ideally , 70    Review of Systems ROS: fatigue: yes ; chest pain : no;claudication: no; palpitations: no; abd pain/bowel changes: no ; myalgias:no;  syncope :no ; memory loss: no;skin changes: no. Weigh up 4#.  She had a recent cough with intermittent minimal sputum. She denies fever, chills, or shortness of breath     Objective:   Physical Exam She appears healthy and well-nourished; she is in no acute distress.  Thyroid is normal without nodularity or enlargement  No carotid bruits are present.  Heart rhythm and rate are normal with no gallops. She has a grade 1 systolic murmur at the base  Chest is clear with no increased work of breathing except for isolated late inspiratory cough the right mid posterior thorax. This resolved after 2 deep inspirations. There was no other evidence of bronchospasm.  There is no evidence of aortic aneurysm or renal artery bruits  she has no clubbing or edema.   Pedal pulses are intact   No ischemic skin changes are present         Assessment & Plan:    She describes cough without frank acute bronchitis. 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 I will ask her to blow up 5- 10 balloons/day to prevent atelectasis and she's had pneumonia in the past.

## 2011-03-21 NOTE — Assessment & Plan Note (Signed)
She had intolerance to pravastatin in the past. She understands the risk. One option would be low-dose Crestor one to 2 times a week. She will review the book on cholesterol restriction. Coated baby aspirin would be recommended as per standards.

## 2011-03-21 NOTE — Patient Instructions (Signed)
Please  blowup at least 10  balloons a day to enhance inflation of the lungs and prevent atelectasis as we discussed. Please take enteric-coated aspirin 81 mg daily with breakfast.  Risk of premature heart attack or stroke increases as LDL or BAD cholesterol rises.Advanced cholesterol panels optimally determine risk based on particle composition ( NMR Lipoprofile ) or by assessing multiple other genetic risks([facility] Heart Panel or [facility] Lipid Panel). These are indicated when LDL is > 130, especially if there is family history of heart attack in males before 76 or women before 80. Based on your prior advanced testing, your LDL goal is < 100 , ideally < 70. Your present LDL increases long term heart attack or stroke risk 33 %.Please consider these facts & see me if you wish to consider low dose Crestor. The best dietary  information on cholesterol is Dr Amanda [name] book Eat, Drink & Be Healthy. If Crestor 20 mg taken 1/2 pill on Weds; schedule fasting Labs after 10 weeks : CK,Lipids, hepatic panel. PLEASE BRING THESE INSTRUCTIONS TO FOLLOW UP  LAB APPOINTMENT.This will guarantee correct labs are drawn, eliminating need for repeat blood sampling ( needle sticks ! ). Diagnoses /Codes: 272.4, 995.20

## 2011-03-22 ENCOUNTER — Telehealth: Payer: Self-pay | Admitting: *Deleted

## 2011-03-22 DIAGNOSIS — Z1211 Encounter for screening for malignant neoplasm of colon: Secondary | ICD-10-CM | POA: Diagnosis not present

## 2011-03-22 LAB — POC HEMOCCULT BLD/STL (OFFICE/1-CARD/DIAGNOSTIC): Fecal Occult Blood, POC: NEGATIVE

## 2011-03-22 NOTE — Telephone Encounter (Signed)
Pt was seen in office on 03/18/11 as new patient for vaginal dryness. She was giving estradiol .02%  1ml twice a week. Pt applied yesterday and woke up this am and stood up and had a dizzy spell, nausea, cound'nt keep her balance. She had no vomiting. Pt wants to know is she should monitor for a now and call back if happens again? Pt said this is only time this has happened. Please advise

## 2011-03-22 NOTE — Telephone Encounter (Signed)
Pt informed with the below note. Pt saw her PCP yesterday and everything was fine.

## 2011-03-22 NOTE — Telephone Encounter (Signed)
Patient called the office this afternoon with the following complaint:woke up this am and stood up and had a dizzy spell, nausea. She was seen last week and had been prescribed estradiol 0.02% to apply intravaginally twice a week for vaginal atrophy. It is very unlikely that the application of the estrogen at such a low dose transvaginally cause the above symptoms. She should contact her primary physician to be evaluated or may need to be seen in urgent care or the emergency room with these symptoms especially at her age.

## 2011-04-02 ENCOUNTER — Ambulatory Visit: Payer: Medicare Other

## 2011-04-02 ENCOUNTER — Ambulatory Visit
Admission: RE | Admit: 2011-04-02 | Discharge: 2011-04-02 | Disposition: A | Discharge status: Home / Self Care | Payer: Medicare Other | Source: Ambulatory Visit | Attending: Gynecology | Admitting: Gynecology

## 2011-04-02 ENCOUNTER — Ambulatory Visit (INDEPENDENT_AMBULATORY_CARE_PROVIDER_SITE_OTHER): Payer: Medicare Other

## 2011-04-02 ENCOUNTER — Other Ambulatory Visit: Payer: Self-pay | Admitting: Gynecology

## 2011-04-02 DIAGNOSIS — Z1231 Encounter for screening mammogram for malignant neoplasm of breast: Secondary | ICD-10-CM | POA: Diagnosis not present

## 2011-04-02 DIAGNOSIS — Z780 Asymptomatic menopausal state: Secondary | ICD-10-CM

## 2011-04-02 DIAGNOSIS — M949 Disorder of cartilage, unspecified: Secondary | ICD-10-CM

## 2011-04-02 DIAGNOSIS — M899 Disorder of bone, unspecified: Secondary | ICD-10-CM | POA: Diagnosis not present

## 2011-04-02 DIAGNOSIS — M8580 Other specified disorders of bone density and structure, unspecified site: Secondary | ICD-10-CM

## 2011-04-11 ENCOUNTER — Telehealth: Payer: Self-pay | Admitting: Internal Medicine

## 2011-04-11 ENCOUNTER — Other Ambulatory Visit: Payer: Self-pay | Admitting: Internal Medicine

## 2011-04-11 DIAGNOSIS — H3530 Unspecified macular degeneration: Secondary | ICD-10-CM

## 2011-04-11 NOTE — Telephone Encounter (Signed)
Patient saw Dr. Elmer Picker and was diagnosed with macular degeneration. She would like Dr. Alwyn Ren to refer her to [facility] for a 2nd opinion.

## 2011-04-18 DIAGNOSIS — H4420 Degenerative myopia, unspecified eye: Secondary | ICD-10-CM | POA: Diagnosis not present

## 2011-04-18 DIAGNOSIS — H353190 Nonexudative age-related macular degeneration, unspecified eye, stage unspecified: Secondary | ICD-10-CM | POA: Diagnosis not present

## 2011-04-18 DIAGNOSIS — Z961 Presence of intraocular lens: Secondary | ICD-10-CM | POA: Diagnosis not present

## 2011-05-20 DIAGNOSIS — H5710 Ocular pain, unspecified eye: Secondary | ICD-10-CM | POA: Diagnosis not present

## 2011-05-20 DIAGNOSIS — H04129 Dry eye syndrome of unspecified lacrimal gland: Secondary | ICD-10-CM | POA: Diagnosis not present

## 2011-05-20 DIAGNOSIS — H02059 Trichiasis without entropian unspecified eye, unspecified eyelid: Secondary | ICD-10-CM | POA: Diagnosis not present

## 2011-05-20 DIAGNOSIS — H18839 Recurrent erosion of cornea, unspecified eye: Secondary | ICD-10-CM | POA: Diagnosis not present

## 2011-06-03 DIAGNOSIS — H18839 Recurrent erosion of cornea, unspecified eye: Secondary | ICD-10-CM | POA: Diagnosis not present

## 2011-06-03 DIAGNOSIS — H18599 Other hereditary corneal dystrophies, unspecified eye: Secondary | ICD-10-CM | POA: Diagnosis not present

## 2011-06-19 DIAGNOSIS — M7730 Calcaneal spur, unspecified foot: Secondary | ICD-10-CM | POA: Diagnosis not present

## 2011-06-19 DIAGNOSIS — M7750 Other enthesopathy of unspecified foot: Secondary | ICD-10-CM | POA: Diagnosis not present

## 2011-07-10 DIAGNOSIS — M7730 Calcaneal spur, unspecified foot: Secondary | ICD-10-CM | POA: Diagnosis not present

## 2011-08-21 ENCOUNTER — Telehealth: Payer: Self-pay | Admitting: Internal Medicine

## 2011-08-21 ENCOUNTER — Other Ambulatory Visit: Payer: Self-pay | Admitting: Internal Medicine

## 2011-08-21 DIAGNOSIS — R32 Unspecified urinary incontinence: Secondary | ICD-10-CM

## 2011-08-21 NOTE — Telephone Encounter (Signed)
Patient called and states she is still having a problem with Incontinence, can Hopp. Refer her to Urology or does he need for her to come in here first? Please advise  Patient call back# [phone]-cell Home [phone]

## 2011-10-04 DIAGNOSIS — R351 Nocturia: Secondary | ICD-10-CM | POA: Diagnosis not present

## 2011-10-04 DIAGNOSIS — N3946 Mixed incontinence: Secondary | ICD-10-CM | POA: Diagnosis not present

## 2011-10-17 ENCOUNTER — Other Ambulatory Visit: Payer: Self-pay | Admitting: Internal Medicine

## 2011-10-18 NOTE — Telephone Encounter (Signed)
TSH 244.9 

## 2011-10-25 ENCOUNTER — Telehealth: Payer: Self-pay | Admitting: Internal Medicine

## 2011-10-25 NOTE — Telephone Encounter (Signed)
Pt called stated she spoke with someone on Monday to request a change but I do not see any phone note to that effect. Pt stated she would like to switch from dr.hopper to dr.tabori If both of ou agree please let me know and I will call the patient back at  [phone]

## 2011-10-25 NOTE — Telephone Encounter (Signed)
Called pt at number list LMOM as to providers decision & that pt would not be able to switch back to dr.hopp once they leave

## 2011-10-25 NOTE — Telephone Encounter (Signed)
I OKayed this last week

## 2011-10-25 NOTE — Telephone Encounter (Signed)
Whatever PCP says

## 2011-10-28 NOTE — Telephone Encounter (Signed)
Pt called back and stated she has decided not to switch from dr.hopper

## 2011-10-29 ENCOUNTER — Telehealth: Payer: Self-pay | Admitting: Internal Medicine

## 2011-10-29 MED ORDER — TRAMADOL HCL 50 MG PO TABS: ORAL_TABLET | ORAL | Status: DC

## 2011-10-29 NOTE — Telephone Encounter (Signed)
Caller: [name]/Patient; Patient Name: Amanda [name]; PCP: Marga Melnick; Best Callback Phone Number: [phone] Pt talked with the office yesterday (she was not seen) for back pain. Pt was prescribed Cyclobenzapine - she took 2 yesterday and 1 today. She was instructed to call back if the muscle relaxers worked and the MD would call in more medication. Pt states her back pain feels much better - sitting and walking is still painful but better than yesterday. Pt had been lifting boxes/helping someone move.  Pt uses [facility] on [address] [phone].

## 2011-10-29 NOTE — Telephone Encounter (Signed)
Tramadol 50 mg # 20.  1/2 -1 q 8 hrs prn. OVINB

## 2011-10-29 NOTE — Telephone Encounter (Signed)
Dr.Hopper please advise 

## 2011-10-29 NOTE — Telephone Encounter (Signed)
RX sent in, left message on voicemail informing patient  

## 2011-10-30 DIAGNOSIS — IMO0001 Reserved for inherently not codable concepts without codable children: Secondary | ICD-10-CM | POA: Diagnosis not present

## 2011-10-30 DIAGNOSIS — M545 Low back pain, unspecified: Secondary | ICD-10-CM | POA: Diagnosis not present

## 2011-10-30 DIAGNOSIS — M999 Biomechanical lesion, unspecified: Secondary | ICD-10-CM | POA: Diagnosis not present

## 2011-10-30 DIAGNOSIS — M5137 Other intervertebral disc degeneration, lumbosacral region: Secondary | ICD-10-CM | POA: Diagnosis not present

## 2011-11-19 DIAGNOSIS — R351 Nocturia: Secondary | ICD-10-CM | POA: Diagnosis not present

## 2011-11-19 DIAGNOSIS — N3946 Mixed incontinence: Secondary | ICD-10-CM | POA: Diagnosis not present

## 2011-11-21 DIAGNOSIS — H40019 Open angle with borderline findings, low risk, unspecified eye: Secondary | ICD-10-CM | POA: Diagnosis not present

## 2011-11-21 DIAGNOSIS — H18599 Other hereditary corneal dystrophies, unspecified eye: Secondary | ICD-10-CM | POA: Diagnosis not present

## 2011-11-21 DIAGNOSIS — H18519 Endothelial corneal dystrophy, unspecified eye: Secondary | ICD-10-CM | POA: Diagnosis not present

## 2011-11-21 DIAGNOSIS — D492 Neoplasm of unspecified behavior of bone, soft tissue, and skin: Secondary | ICD-10-CM | POA: Diagnosis not present

## 2011-11-28 ENCOUNTER — Encounter: Payer: Self-pay | Admitting: Internal Medicine

## 2011-11-28 ENCOUNTER — Ambulatory Visit (INDEPENDENT_AMBULATORY_CARE_PROVIDER_SITE_OTHER): Payer: Medicare Other | Admitting: Internal Medicine

## 2011-11-28 VITALS — BP 118/80 | HR 64 | Temp 98.2°F | Wt 158.2 lb

## 2011-11-28 DIAGNOSIS — R142 Eructation: Secondary | ICD-10-CM | POA: Diagnosis not present

## 2011-11-28 DIAGNOSIS — R141 Gas pain: Secondary | ICD-10-CM | POA: Diagnosis not present

## 2011-11-28 DIAGNOSIS — R109 Unspecified abdominal pain: Secondary | ICD-10-CM | POA: Diagnosis not present

## 2011-11-28 DIAGNOSIS — R351 Nocturia: Secondary | ICD-10-CM | POA: Diagnosis not present

## 2011-11-28 DIAGNOSIS — R140 Abdominal distension (gaseous): Secondary | ICD-10-CM

## 2011-11-28 DIAGNOSIS — R5383 Other fatigue: Secondary | ICD-10-CM | POA: Diagnosis not present

## 2011-11-28 DIAGNOSIS — N3946 Mixed incontinence: Secondary | ICD-10-CM | POA: Diagnosis not present

## 2011-11-28 DIAGNOSIS — R5381 Other malaise: Secondary | ICD-10-CM | POA: Diagnosis not present

## 2011-11-28 DIAGNOSIS — R1012 Left upper quadrant pain: Secondary | ICD-10-CM

## 2011-11-28 MED ORDER — RANITIDINE HCL 150 MG PO TABS: 150.0000 mg | ORAL_TABLET | Freq: Two times a day (BID) | ORAL | Status: DC

## 2011-11-28 MED ORDER — TRAMADOL HCL 50 MG PO TABS: 50.0000 mg | ORAL_TABLET | Freq: Four times a day (QID) | ORAL | Status: DC | PRN

## 2011-11-28 NOTE — Patient Instructions (Addendum)
Review and correct the record as indicated. Please share record with all medical staff seen.   If you activate My Chart; the results can be released to you as soon as they populate from the lab. If you choose not to use this program; the labs have to be reviewed, copied & mailed   causing a delay in getting the results to you.  

## 2011-11-28 NOTE — Progress Notes (Signed)
  Subjective:    Patient ID: Amanda [name], female    DOB: [birth year], 71 y.o.   MRN: [id number]  HPI She has several symptoms; she has had intermittent nausea for one month with bloating. As of 9/23 she began to have marked malaise which is constant and for which she's been taking an afternoon nap. 0n 9/24 she began to have left upper quadrant intermittent sharp pain which would last hours. This was nonradiating. She has had sharp left posterior thoracic pain from the scapula to the waist during the same period time; this is constant.   Past medical history/family history/social history were all reviewed and updated. Pertinent data: Mother ovarian cancer. She's been told her colonoscopy is up-to-date    Review of Systems  Nausea/Vomiting: only nausea  Diarrhea: no  Constipation: chronic  Melena/BRBPR:no Hematemesis: no  Anorexia:no Fever/Chills: flushes of heat followed by chills  Dysuria/hematuria/pyuria:no Rash: no  Wt loss: no NSAIDs/ASA: Tylenol Arthritis helped abd & back pain  Vaginal bleeding/ discharge: no. Her last gynecology exam was a year ago by Dr. Lily Peer. No Pap was done as per protocol. Dr McDiamid, her urologist, will be seeing her today to discuss the tests performed to evaluate incontinence.      Objective:   Physical Exam General appearance is one of good health and nourishment w/o distress.  Eyes: No conjunctival inflammation or scleral icterus is present.  Oral exam: Dental hygiene is good; lips and gums are healthy appearing.There is no oropharyngeal erythema or exudate noted.   Heart:  Normal rate and regular rhythm. S1 and S2 normal without gallop, murmur, click, rub or other extra sounds     Lungs:Chest clear to auscultation; no wheezes, rhonchi,rales ,or rubs present.No increased work of breathing.   Abdomen: bowel sounds normal, soft  without masses, organomegaly or hernias noted.  No guarding or rebound ; she is very tender the left upper quadrant to  light touch. She has exquisite tenderness over the left flank to light touch  Skin:Warm & dry.  Intact without suspicious lesions or rashes ; no jaundice or tenting. There is no evidence of zoster clinically  Lymphatic: No lymphadenopathy is noted about the head, neck, axilla areas.   Neuro: Normal deep tendon reflexes. Tone and strength normal  Musculoskeletal: Negative straight leg raising.             Assessment & Plan:  #1 left upper quadrant pain with bloating; tenderness to light touch on exam  #2 tenderness to light touch left flank  #3 malaise  Plan: See orders and recommendations. She likely will need imaging.

## 2011-11-29 ENCOUNTER — Other Ambulatory Visit (INDEPENDENT_AMBULATORY_CARE_PROVIDER_SITE_OTHER): Payer: Medicare Other

## 2011-11-29 DIAGNOSIS — N390 Urinary tract infection, site not specified: Secondary | ICD-10-CM | POA: Diagnosis not present

## 2011-11-29 LAB — BASIC METABOLIC PANEL WITH GFR
BUN: 18 mg/dL (ref 6–23)
CO2: 29 meq/L (ref 19–32)
Calcium: 9.1 mg/dL (ref 8.4–10.5)
Chloride: 101 meq/L (ref 96–112)
Creatinine, Ser: 0.8 mg/dL (ref 0.4–1.2)
GFR: 71.64 mL/min
Glucose, Bld: 109 mg/dL — ABNORMAL HIGH (ref 70–99)
Potassium: 3.9 meq/L (ref 3.5–5.1)
Sodium: 137 meq/L (ref 135–145)

## 2011-11-29 LAB — HEPATIC FUNCTION PANEL
ALT: 18 U/L (ref 0–35)
AST: 19 U/L (ref 0–37)
Albumin: 3.8 g/dL (ref 3.5–5.2)
Alkaline Phosphatase: 60 U/L (ref 39–117)
Bilirubin, Direct: 0 mg/dL (ref 0.0–0.3)
Total Bilirubin: 0.5 mg/dL (ref 0.3–1.2)
Total Protein: 6.8 g/dL (ref 6.0–8.3)

## 2011-11-29 LAB — CBC WITH DIFFERENTIAL/PLATELET
Basophils Absolute: 0 10*3/uL (ref 0.0–0.1)
Eosinophils Absolute: 0.1 10*3/uL (ref 0.0–0.7)
HCT: 38.3 % (ref 36.0–46.0)
Hemoglobin: 12.6 g/dL (ref 12.0–15.0)
Lymphs Abs: 1.3 10*3/uL (ref 0.7–4.0)
MCHC: 33 g/dL (ref 30.0–36.0)
MCV: 83.3 fl (ref 78.0–100.0)
Neutro Abs: 2.9 10*3/uL (ref 1.4–7.7)
RDW: 14 % (ref 11.5–14.6)

## 2011-11-29 LAB — TSH: TSH: 4.01 u[IU]/mL (ref 0.35–5.50)

## 2011-12-02 DIAGNOSIS — N390 Urinary tract infection, site not specified: Secondary | ICD-10-CM | POA: Diagnosis not present

## 2011-12-02 LAB — POCT URINALYSIS DIPSTICK
Ketones, UA: NEGATIVE
Nitrite, UA: NEGATIVE
Protein, UA: NEGATIVE

## 2011-12-03 LAB — URINE CULTURE: Colony Count: 1000

## 2011-12-12 ENCOUNTER — Telehealth: Payer: Self-pay | Admitting: *Deleted

## 2011-12-12 ENCOUNTER — Ambulatory Visit (INDEPENDENT_AMBULATORY_CARE_PROVIDER_SITE_OTHER): Payer: Medicare Other | Admitting: Gynecology

## 2011-12-12 ENCOUNTER — Encounter: Payer: Self-pay | Admitting: Gynecology

## 2011-12-12 VITALS — BP 136/84

## 2011-12-12 DIAGNOSIS — R141 Gas pain: Secondary | ICD-10-CM

## 2011-12-12 DIAGNOSIS — Z8041 Family history of malignant neoplasm of ovary: Secondary | ICD-10-CM | POA: Diagnosis not present

## 2011-12-12 DIAGNOSIS — R142 Eructation: Secondary | ICD-10-CM

## 2011-12-12 DIAGNOSIS — K589 Irritable bowel syndrome without diarrhea: Secondary | ICD-10-CM

## 2011-12-12 DIAGNOSIS — R140 Abdominal distension (gaseous): Secondary | ICD-10-CM

## 2011-12-12 DIAGNOSIS — Z803 Family history of malignant neoplasm of breast: Secondary | ICD-10-CM

## 2011-12-12 DIAGNOSIS — K5900 Constipation, unspecified: Secondary | ICD-10-CM

## 2011-12-12 DIAGNOSIS — R143 Flatulence: Secondary | ICD-10-CM | POA: Diagnosis not present

## 2011-12-12 DIAGNOSIS — K5909 Other constipation: Secondary | ICD-10-CM

## 2011-12-12 MED ORDER — LINACLOTIDE 145 MCG PO CAPS: 145.0000 ug | ORAL_CAPSULE | Freq: Every day | ORAL | Status: DC

## 2011-12-12 NOTE — Progress Notes (Addendum)
Patient is a 71 year old who presented to the office today with a complaint this year of abdominal bloating and chronic constipation. She has a family history of ovarian and breast cancer in her mother. Patient is her primary physician recently with the same symptoms. She is here to rule out a GYN issues contributing to her symptoms. Patient for the past several months has felt bloated and nauseated at times. She states that her abdominal bloating and discomfort is not associated with any meals. She has been followed by the urologist for bladder spasms and has had workup although I do not have the details. Patient is sexually active has no dyspareunia and she has been on Estrace vaginal cream for less than a year which she applies twice a week and has helped with her vaginal atrophy that she had experienced before. Patient states her last colonoscopy was 7-1/2 years ago by Dr. Matthias Hughs and states that she's os had normal studies. Her mammogram was normal in January of this year.  Exam: Abdomen: Soft nontender no rebound or guarding Pelvic: Bartholin urethra Skene was within normal limits Vagina: No lesions or discharge\ Cervix: No lesions or discharge uterus: Axial Adnexa: No discernible mass per se but due to patient's abdominal tightening a full assessment was not possible and for this reason she will return back for an ultrasound. Rectal exam: Unremarkable  Assessment/plan: #1 patient with history of what appears to be irritable bowel syndrome and chronic constipation. Patient's last colonoscopy 7 years ago reportedly normal. She will be given Hemoccult card to submit to the office for testing. She will be placed on Linzess 145 mcg caps daily. She was encouraged to increase her fluid intake in her diet should be one  as high in fruits vegetables and fiber. #2 patient will return back next week for a pelvic ultrasound for better assessment of her ovaries and uterus. #3 since her mother had both breasts  and ovarian cancer I am recommending the patient be seen at [facility] geneticist for possible BRCA1 BRCA2  mutation testing. (Normal mammogram January this year).  IF  patient's pelvic ultrasound were  completely normal then we would recommend  she follow back with her gastroenterologist for further evaluation of her GI tract. Literature information on the above was provided and we will see her next week.

## 2011-12-12 NOTE — Patient Instructions (Addendum)
Irritable Bowel Syndrome Irritable Bowel Syndrome (IBS) is caused by a disturbance of normal bowel function. Other terms used are spastic colon, mucous colitis, and irritable colon. It does not require surgery, nor does it lead to cancer. There is no cure for IBS. But with proper diet, stress reduction, and medication, you will find that your problems (symptoms) will gradually disappear or improve. IBS is a common digestive disorder. It usually appears in late adolescence or early adulthood. Women develop it twice as often as men. CAUSES  After food has been digested and absorbed in the small intestine, waste material is moved into the colon (large intestine). In the colon, water and salts are absorbed from the undigested products coming from the small intestine. The remaining residue, or fecal material, is held for elimination. Under normal circumstances, gentle, rhythmic contractions on the bowel walls push the fecal material along the colon towards the rectum. In IBS, however, these contractions are irregular and poorly coordinated. The fecal material is either retained too long, resulting in constipation, or expelled too soon, producing diarrhea. SYMPTOMS  The most common symptom of IBS is pain. It is typically in the lower left side of the belly (abdomen). But it may occur anywhere in the abdomen. It can be felt as heartburn, backache, or even as a dull pain in the arms or shoulders. The pain comes from excessive bowel-muscle spasms and from the buildup of gas and fecal material in the colon. This pain:  Can range from sharp belly (abdominal) cramps to a dull, continuous ache.  Usually worsens soon after eating.  Is typically relieved by having a bowel movement or passing gas. Abdominal pain is usually accompanied by constipation. But it may also produce diarrhea. The diarrhea typically occurs right after a meal or upon arising in the morning. The stools are typically soft and watery. They are often  flecked with secretions (mucus). Other symptoms of IBS include:  Bloating.  Loss of appetite.  Heartburn.  Feeling sick to your stomach (nausea).  Belching  Vomiting  Gas. IBS may also cause a number of symptoms that are unrelated to the digestive system:  Fatigue.  Headaches.  Anxiety  Shortness of breath  Difficulty in concentrating.  Dizziness. These symptoms tend to come and go. DIAGNOSIS  The symptoms of IBS closely mimic the symptoms of other, more serious digestive disorders. So your caregiver may wish to perform a variety of additional tests to exclude these disorders. He/she wants to be certain of learning what is wrong (diagnosis). The nature and purpose of each test will be explained to you. TREATMENT A number of medications are available to help correct bowel function and/or relieve bowel spasms and abdominal pain. Among the drugs available are:  Mild, non-irritating laxatives for severe constipation and to help restore normal bowel habits.  Specific anti-diarrheal medications to treat severe or prolonged diarrhea.  Anti-spasmodic agents to relieve intestinal cramps.  Your caregiver may also decide to treat you with a mild tranquilizer or sedative during unusually stressful periods in your life. The important thing to remember is that if any drug is prescribed for you, make sure that you take it exactly as directed. Make sure that your caregiver knows how well it worked for you. HOME CARE INSTRUCTIONS   Avoid foods that are high in fat or oils. Some examples JJO:ACZYS cream, butter, frankfurters, sausage, and other fatty meats.  Avoid foods that have a laxative effect, such as fruit, fruit juice, and dairy products.  Cut out  carbonated drinks, chewing gum, and "gassy" foods, such as beans and cabbage. This may help relieve bloating and belching.  Bran taken with plenty of liquids may help relieve constipation.  Keep track of what foods seem to trigger  your symptoms.  Avoid emotionally charged situations or circumstances that produce anxiety.  Start or continue exercising.  Get plenty of rest and sleep. MAKE SURE YOU:   Understand these instructions.  Will watch your condition.  Will get help right away if you are not doing well or get worse. Document Released: 02/18/2005 Document Revised: 05/13/2011 Document Reviewed: 10/09/2007 [facility] Patient Information 2013 [place], [place].  Constipation, Adult Constipation is when a person has fewer than 3 bowel movements a week; has difficulty having a bowel movement; or has stools that are dry, hard, or larger than normal. As people grow older, constipation is more common. If you try to fix constipation with medicines that make you have a bowel movement (laxatives), the problem may get worse. Long-term laxative use may cause the muscles of the colon to become weak. A low-fiber diet, not taking in enough fluids, and taking certain medicines may make constipation worse. CAUSES   Certain medicines, such as antidepressants, pain medicine, iron supplements, antacids, and water pills.   Certain diseases, such as diabetes, irritable bowel syndrome (IBS), thyroid disease, or depression.   Not drinking enough water.   Not eating enough fiber-rich foods.   Stress or travel.  Lack of physical activity or exercise.  Not going to the restroom when there is the urge to have a bowel movement.  Ignoring the urge to have a bowel movement.  Using laxatives too much. SYMPTOMS   Having fewer than 3 bowel movements a week.   Straining to have a bowel movement.   Having hard, dry, or larger than normal stools.   Feeling full or bloated.   Pain in the lower abdomen.  Not feeling relief after having a bowel movement. DIAGNOSIS  Your caregiver will take a medical history and perform a physical exam. Further testing may be done for severe constipation. Some tests may include:   A barium  enema X-ray to examine your rectum, colon, and sometimes, your small intestine.  A sigmoidoscopy to examine your lower colon.  A colonoscopy to examine your entire colon. TREATMENT  Treatment will depend on the severity of your constipation and what is causing it. Some dietary treatments include drinking more fluids and eating more fiber-rich foods. Lifestyle treatments may include regular exercise. If these diet and lifestyle recommendations do not help, your caregiver may recommend taking over-the-counter laxative medicines to help you have bowel movements. Prescription medicines may be prescribed if over-the-counter medicines do not work.  HOME CARE INSTRUCTIONS   Increase dietary fiber in your diet, such as fruits, vegetables, whole grains, and beans. Limit high-fat and processed sugars in your diet, such as [place] fries, hamburgers, cookies, candies, and soda.   A fiber supplement may be added to your diet if you cannot get enough fiber from foods.   Drink enough fluids to keep your urine clear or pale yellow.   Exercise regularly or as directed by your caregiver.   Go to the restroom when you have the urge to go. Do not hold it.  Only take medicines as directed by your caregiver. Do not take other medicines for constipation without talking to your caregiver first. SEEK IMMEDIATE MEDICAL CARE IF:   You have bright red blood in your stool.   Your constipation lasts  for more than 4 days or gets worse.   You have abdominal or rectal pain.   You have thin, pencil-like stools.  You have unexplained weight loss. MAKE SURE YOU:   Understand these instructions.  Will watch your condition.  Will get help right away if you are not doing well or get worse. Document Released: 11/17/2003 Document Revised: 05/13/2011 Document Reviewed: 01/22/2011 [facility] Patient Information 2013 [place], [place].  BRCA-1 and BRCA-2 BRCA-1 and BRCA-2 are 2 genes that are linked with hereditary  breast and ovarian cancers. About 200,000 women are diagnosed with invasive breast cancer each year and about 23,000 with ovarian cancer (according to the American Cancer Society). Of these cancers, about 5% to 10% will be due to a mutation in one of the BRCA genes. Men can also inherit an increased risk of developing breast cancer, primarily from an alteration in the BRCA-2 gene.  Individuals with mutations in BRCA1 or BRCA2 have significantly elevated risks for breast cancer (up to 80% lifetime risk), ovarian cancer (up to 40% lifetime risk), bilateral breast cancer and other types of cancers. BRCA mutations are inherited and passed from generation to generation. One half of the time, they are passed from the father's side of the family.  The DNA in white blood cells is used to detect mutations in the BRCA genes. While the gene products (proteins) of the BRCA genes act only in breast and ovarian tissue, the genes are present in every cell of the body and blood is the most easily accessible source of that DNA. PREPARATION FOR TEST The test for BRCA mutations is done on a blood sample collected by needle from a vein in the arm. The test does not require surgical biopsy of breast or ovarian tissue.  NORMAL FINDINGS No genetic mutations. Ranges for normal findings may vary among different laboratories and hospitals. You should always check with your doctor after having lab work or other tests done to discuss the meaning of your test results and whether your values are considered within normal limits. MEANING OF TEST  Your caregiver will go over the test results with you and discuss the importance and meaning of your results, as well as treatment options and the need for additional tests if necessary. OBTAINING THE TEST RESULTS It is your responsibility to obtain your test results. Ask the lab or department performing the test when and how you will get your results. OTHER THINGS TO KNOW Your test results  may have implications for other family members. When one member of a family is tested for BRCA mutations, issues often arise about how or whether to share this information with other family members. Seek advice from a genetic counselor about communication of result with your family members.  Pre and post test consultation with a health care provider knowledgeable about genetic testing cannot be overemphasized.  There are many issues to be considered when preparing for a genetic test and upon learning the results, and a genetic counselor has the knowledge and experience to help you sort through them.  If the BRCA test is positive, the options include increased frequency of check-ups (e.g., mammography, blood tests for CA-125, or transvaginal ultrasonography); medications that could reduce risk (e.g., oral contraceptives or tamoxifen); or surgical removal of the ovaries or breasts. There are a number of variables involved and it is important to discuss your options with your doctor and genetic counselor. Research studies have reported that for every 1000 women negative for BRCA mutations, between 12 and  45 of them will develop breast cancer by age 70 and between 3 and 4 will develop ovarian cancer by age 58. The risk increases with age. The test can be ordered by a doctor, preferably by one who can also offer genetic counseling. The blood sample will be sent to a laboratory that specializes in BRCA testing. The American Society of Clinical Oncology and the National Breast Cancer Coalition encourage women seeking the test to participate in long-term outcome studies to help gather information on the effectiveness of different check-up and treatment options. Document Released: 03/14/2004 Document Revised: 05/13/2011 Document Reviewed: 01/25/2008 [facility] Patient Information 2013 [place], [place].

## 2011-12-12 NOTE — Telephone Encounter (Signed)
Message copied by Aura Camps on Thu Dec 12, 2011  4:07 PM ------      Message from: Ok Edwards      Created: Thu Dec 12, 2011  2:37 PM       Victorino Dike, this patient needs to be [facility] for possible BRCA 1 BRCA2 testing since her mother had both breasts and ovarian cancer.

## 2011-12-13 ENCOUNTER — Ambulatory Visit (INDEPENDENT_AMBULATORY_CARE_PROVIDER_SITE_OTHER): Payer: Medicare Other | Admitting: Gynecology

## 2011-12-13 ENCOUNTER — Encounter: Payer: Self-pay | Admitting: Gynecology

## 2011-12-13 ENCOUNTER — Ambulatory Visit (INDEPENDENT_AMBULATORY_CARE_PROVIDER_SITE_OTHER): Payer: Medicare Other

## 2011-12-13 ENCOUNTER — Other Ambulatory Visit (HOSPITAL_COMMUNITY)
Admission: RE | Admit: 2011-12-13 | Discharge: 2011-12-13 | Disposition: A | Discharge status: Home / Self Care | Payer: Medicare Other | Source: Ambulatory Visit | Attending: Gynecology | Admitting: Gynecology

## 2011-12-13 DIAGNOSIS — R142 Eructation: Secondary | ICD-10-CM | POA: Diagnosis not present

## 2011-12-13 DIAGNOSIS — Z803 Family history of malignant neoplasm of breast: Secondary | ICD-10-CM

## 2011-12-13 DIAGNOSIS — Z780 Asymptomatic menopausal state: Secondary | ICD-10-CM | POA: Diagnosis not present

## 2011-12-13 DIAGNOSIS — Z124 Encounter for screening for malignant neoplasm of cervix: Secondary | ICD-10-CM | POA: Insufficient documentation

## 2011-12-13 DIAGNOSIS — R140 Abdominal distension (gaseous): Secondary | ICD-10-CM

## 2011-12-13 DIAGNOSIS — Z8041 Family history of malignant neoplasm of ovary: Secondary | ICD-10-CM

## 2011-12-13 DIAGNOSIS — R143 Flatulence: Secondary | ICD-10-CM

## 2011-12-13 DIAGNOSIS — R9389 Abnormal findings on diagnostic imaging of other specified body structures: Secondary | ICD-10-CM | POA: Diagnosis not present

## 2011-12-13 DIAGNOSIS — R141 Gas pain: Secondary | ICD-10-CM | POA: Diagnosis not present

## 2011-12-13 DIAGNOSIS — Z1151 Encounter for screening for human papillomavirus (HPV): Secondary | ICD-10-CM | POA: Insufficient documentation

## 2011-12-13 NOTE — Patient Instructions (Addendum)
Patient information: Dilation and curettage (D and C)   What is dilation and curettage? - Dilation and curettage, also called a "D and C," is a procedure to remove tissue from the inside of the uterus (figure 1). During a D and C, a doctor first opens (dilates) the cervix. Then he or she puts a surgical tool called a "curette" through the vagina and cervix, and up into the uterus. He or she uses the curette to scrape and remove tissue from the uterus. A D and C is done in an operating room in a hospital or clinic, and it takes about 15 to 30 minutes.  Why might my doctor do a D and C? - Your doctor might do a D and C to figure out the cause of a symptom or problem. During a D and C, a doctor gets a sample of tissue from your uterus that can be checked for abnormal cells, cancer, or other problems. Women might have a D and C: To figure out why they have abnormal bleeding from their uterus  If they had an abnormal result from another test, such as a test for uterine cancer Doctors can also do a D and C to treat a medical problem or condition. In pregnant or recently pregnant women, a doctor can do a D and C to:  Remove any pregnancy tissue that is left in the uterus after a miscarriage - A miscarriage is when a pregnancy ends on its own.  Do an abortion - An abortion is when a woman chooses to end a pregnancy.  Remove an abnormal growth called a "molar pregnancy" that has formed in the uterus of a pregnant woman In women who are not pregnant, a doctor can do a D and C to: Stop severe vaginal bleeding. For example, a woman might have a D and C if her period is too heavy or if she keeps bleeding too much after giving birth. What should I do before a D and C? - Your doctor will give you instructions about what to do before a D and C. He or she will probably tell you not to eat or drink anything starting the night before the procedure. Your doctor might give you a device or a medicine to put inside your  vagina near your cervix the day before your D and C. The device or medicine can soften the cervix or start to dilate it. Let your doctor or nurse know if you have any trouble getting ready for your D and C. What happens during a D and C? - You will have an IV (needle) put in your arm or hand. Your doctor or nurse will give you fluids and medicines through the IV. Some of these medicines will make you feel relaxed, sleepy, or numb during the procedure. When a doctor does a D and C to find out why a woman is having symptoms, he or she might do another test called a hysteroscopy at the same time. During a hysteroscopy, a doctor puts a small camera inside the uterus to look for problems that could be causing the symptoms. If a woman has a growth in her uterus, a doctor might do a procedure during the D and C to remove it. When a doctor does a D and C to treat a problem or condition, he or she will remove everything inside the uterus. He or she might also scrape away some of the lining of the uterus.  What happens after  a D and C? - After a D and C, your doctor or nurse will watch you for a few hours to make sure you don't have any problems. Another person should drive you home from the hospital.  Your doctor or nurse will tell you when you can do your usual activities again. He or she will also tell you when you can have sex and put things, such as tampons, in your vagina. Most women get their monthly period within 4 to 6 weeks after a D and C. What are the side effects of a D and C? - The most common side effects are mild cramping and slight bleeding from the vagina (called spotting). These can last for a few days. If you have pain, you can take a pain-reliever. Other side effects are not as common but can occur after a D and C. These include: A tear in the uterus  Injury to the cervix  Infection  Areas of scar tissue that form in the uterus Should I call my doctor or nurse? - Call your doctor or nurse if  you have any of the following problems after your D and C: Fever higher than 101F (38C)  Cramps that last more than 2 days  Pain that gets worse  Heavy bleeding, or bleeding that lasts more than 2 weeks  Fluid coming out of your vagina that is green or smells bad  Hysteroscopy Hysteroscopy is a procedure used for looking inside the womb (uterus). It may be done for many different reasons, including:  To evaluate abnormal bleeding, fibroid (benign, noncancerous) tumors, polyps, scar tissue (adhesions), and possibly cancer of the uterus.  To look for lumps (tumors) and other uterine growths.  To look for causes of why a woman cannot get pregnant (infertility), causes of recurrent loss of pregnancy (miscarriages), or a lost intrauterine device (IUD).  To perform a sterilization by blocking the fallopian tubes from inside the uterus. A hysteroscopy should be done right after a menstrual period to be sure you are not pregnant. LET YOUR CAREGIVER KNOW ABOUT:   Allergies.  Medicines taken, including herbs, eyedrops, over-the-counter medicines, and creams.  Use of steroids (by mouth or creams).  Previous problems with anesthetics or numbing medicines.  History of bleeding or blood problems.  History of blood clots.  Possibility of pregnancy, if this applies.  Previous surgery.  Other health problems. RISKS AND COMPLICATIONS   Putting a hole in the uterus.  Excessive bleeding.  Infection.  Damage to the cervix.  Injury to other organs.  Allergic reaction to medicines.  Too much fluid used in the uterus for the procedure. BEFORE THE PROCEDURE   Do not take aspirin or blood thinners for a week before the procedure, or as directed. It can cause bleeding.  Arrive at least 60 minutes before the procedure or as directed to read and sign the necessary forms.  Arrange for someone to take you home after the procedure.  If you smoke, do not smoke for 2 weeks before the  procedure. PROCEDURE   Your caregiver may give you medicine to relax you. He or she may also give you a medicine that numbs the area around the cervix (local anesthetic) or a medicine that makes you sleep (general anesthesia).  Sometimes, a medicine is placed in the cervix the day before the procedure. This medicine makes the cervix have a larger opening (dilate). This makes it easier for the instrument to be inserted into the uterus.  A small  instrument (hysteroscope) is inserted through the vagina into the uterus. This instrument is similar to a pencil-sized telescope with a light.  During the procedure, air or a liquid is put into the uterus, which allows the surgeon to see better.  Sometimes, tissue is gently scraped from inside the uterus. These tissue samples are sent to a specialist who looks at tissue samples (pathologist). The pathologist will give a report to your caregiver. This will help your caregiver decide if further treatment is necessary. The report will also help your caregiver decide on the best treatment if the test comes back abnormal. AFTER THE PROCEDURE   If you had a general anesthetic, you may be groggy for a couple hours after the procedure.  If you had a local anesthetic, you will be advised to rest at the surgical center or caregiver's office until you are stable and feel ready to go home.  You may have some cramping for a couple days.  You may have bleeding, which varies from light spotting for a few days to menstrual-like bleeding for up to 3 to 7 days. This is normal.  Have someone take you home. FINDING OUT THE RESULTS OF YOUR TEST Not all test results are available during your visit. If your test results are not back during the visit, make an appointment with your caregiver to find out the results. Do not assume everything is normal if you have not heard from your caregiver or the medical facility. It is important for you to follow up on all of your test  results. HOME CARE INSTRUCTIONS   Do not drive for 24 hours or as instructed.  Only take over-the-counter or prescription medicines for pain, discomfort, or fever as directed by your caregiver.  Do not take aspirin. It can cause or aggravate bleeding.  Do not drive or drink alcohol while taking pain medicine.  You may resume your usual diet.  Do not use tampons, douche, or have sexual intercourse for 2 weeks, or as advised by your caregiver.  Rest and sleep for the first 24 to 48 hours.  Take your temperature twice a day for 4 to 5 days. Write it down. Give these temperatures to your caregiver if they are abnormal (above 98.6 F or 37.0 C).  Take medicines your caregiver has ordered as directed.  Follow your caregiver's advice regarding diet, exercise, lifting, driving, and general activities.  Take showers instead of baths for 2 weeks, or as recommended by your caregiver.  If you develop constipation:  Take a mild laxative with the advice of your caregiver.  Eat bran foods.  Drink enough water and fluids to keep your urine clear or pale yellow.  Try to have someone with you or available to you for the first 24 to 48 hours, especially if you had a general anesthetic.  Make sure you and your family understand everything about your operation and recovery.  Follow your caregiver's advice regarding follow-up appointments and Pap smears. SEEK MEDICAL CARE IF:   You feel dizzy or lightheaded.  You feel sick to your stomach (nauseous).  You develop abnormal vaginal discharge.  You develop a rash.  You have an abnormal reaction or allergy to your medicine.  You need stronger pain medicine. SEEK IMMEDIATE MEDICAL CARE IF:   Bleeding is heavier than a normal menstrual period or you have blood clots.  You have an oral temperature above 102 F (38.9 C), not controlled by medicine.  You have increasing cramps or pains not  relieved with medicine.  You develop belly  (abdominal) pain that does not seem to be related to the same area of earlier cramping and pain.  You pass out.  You develop pain in the tops of your shoulders (shoulder strap areas).  You develop shortness of breath. MAKE SURE YOU:   Understand these instructions.  Will watch your condition.  Will get help right away if you are not doing well or get worse. Document Released: 05/27/2000 Document Revised: 05/13/2011 Document Reviewed: 09/19/2008 [facility] Patient Information 2013 [place], [place].

## 2011-12-13 NOTE — Progress Notes (Signed)
Amanda [name] Patient is a 71 year old who presented to the office on October 11 with a complaint this year of abdominal bloating and chronic constipation. She has a family history of ovarian and breast cancer in her mother. Patient had described the symptoms to her primary physician.  Patient for the past several months has felt bloated and nauseated at times. She states that her abdominal bloating and discomfort is not associated with any meals. She has been followed by the urologist for bladder spasms and has had workup although I do not have the details. Patient is sexually active has no dyspareunia and she has been on Estrace vaginal cream for less than a year which she applies twice a week and has helped with her vaginal atrophy that she had experienced before. Patient states her last colonoscopy was 7-1/2 years ago by Dr. Matthias [name] and states that she was informed that she had normal studies. Her mammogram was normal in January of this year.  Patient presented today for an ultrasound and further evaluation. The ultrasound demonstrated the following: Uterus measured 4.9 x 3.0 cm with endometrial stripe of 8 mm a single calcified fibroid measured 10 x 9 mm was noted. Ovaries appeared to be normal. Endometrium was thickened with no polyps seen.  An attempted biopsy in the office in an effort to obtain an endometrial sampling was not successful despite paracervical block. Minimal amount of tissue was obtained and created much discomfort. Patient is being scheduled for a hysteroscopy D&C for further evaluation of the thickened endometrium.   Pertinent Gynecological History: Menses: post-menopausal Bleeding: None Contraception: none DES exposure: denies Blood transfusions: none Sexually transmitted diseases: no past history Previous GYN Procedures: 3 vaginal deliveries and one C-section  Last mammogram: Date: 2013 Last pap: Obtained today Date: Result pending OB History: G 4, P 4   Menstrual  History: Menarche age: 16 No LMP recorded. Patient is postmenopausal.    Past Medical History  Diagnosis Date  . Hypothyroidism   . Hyperlipidemia   . Constipation, chronic     Past Surgical History  Procedure Date  . Tonsillectomy and adenoidectomy   . Cesarean section 1959  . Wisdom tooth extraction   . Cataract extraction 2009, 2011    BOTH EYES  . Tubal ligation     BY LAPAROSCOPY  . Dilation and curettage of uterus     X2  . Carpal tunnel release 1999  . Colonoscopy      Dr Amanda [name]    Family History  Problem Relation Age of Onset  . Ovarian cancer Mother   . Breast cancer Mother   . Hypertension Father   . Prostate cancer Father   . Hyperlipidemia Brother   . Kidney failure Father     Social History:  reports that she has never smoked. She has never used smokeless tobacco. She reports that she drinks alcohol. She reports that she does not use illicit drugs.  Allergies:  Allergies  Allergen Reactions  . Levofloxacin     REACTION: Sick on stomach  . Pravastatin Sodium     REACTION: Sick     (Not in a hospital admission)  REVIEW OF SYSTEMS: A ROS was performed and pertinent positives and negatives are included in the history.  GENERAL: No fevers or chills. HEENT: No change in vision, no earache, sore throat or sinus congestion. NECK: No pain or stiffness. CARDIOVASCULAR: No chest pain or pressure. No palpitations. PULMONARY: No shortness of breath, cough or wheeze. GASTROINTESTINAL: No abdominal pain, nausea,  vomiting or diarrhea, melena or bright red blood per rectum. GENITOURINARY: No urinary frequency, urgency, hesitancy or dysuria. MUSCULOSKELETAL: No joint or muscle pain, no back pain, no recent trauma. DERMATOLOGIC: No rash, no itching, no lesions. ENDOCRINE: No polyuria, polydipsia, no heat or cold intolerance. No recent change in weight. HEMATOLOGICAL: No anemia or easy bruising or bleeding. NEUROLOGIC: No headache, seizures, numbness, tingling or  weakness. PSYCHIATRIC: No depression, no loss of interest in normal activity or change in sleep pattern.     There were no vitals taken for this visit.  Physical Exam:  HEENT:unremarkable Neck:Supple, midline, no thyroid megaly, no carotid bruits Lungs:  Clear to auscultation no rhonchi's or wheezes Heart:Regular rate and rhythm, no murmurs or gallops Breast Exam: Examined in January normal Abdomen: Soft nontender no rebound or guarding Pelvic:BUS within normal limits Vagina: Mild atrophic changes Cervix: No gross lesions Uterus: Axial Adnexa: No palpable masses or tenderness Extremities: No cords, no edema Rectal: Unremarkable  No results found for this or any previous visit (from the past 24 hour(s)).  No results found.  Assessment/Plan: Patient with bloating sensation and family history of breast ovarian cancer who's ultrasound today demonstrated thickened endometrium 8 mm in size. Unsuccessful in obtaining adequate sampling of the uterine lining today so patient will be scheduled for hysteroscopy D&C. The risk of the operation outlined below as follows:                        Patient was counseled as to the risk of surgery to include the following:  1. Infection (prohylactic antibiotics will be administered)  2. DVT/Pulmonary Embolism (prophylactic pneumo compression stockings will be used)  3.Trauma to internal organs requiring additional surgical procedure to repair any injury to     Internal organs requiring perhaps additional hospitalization days.  4.Hemmorhage requiring transfusion and blood products which carry risks such as  anaphylactic reaction, hepatitis and AIDS  Patient had received literature information on the procedure scheduled and all her questions were answered and accepts all risk.  [facility] HMD2:05 PMTD@   Amanda [name] H 12/13/2011, 1:56 PM

## 2011-12-13 NOTE — Telephone Encounter (Signed)
Referral [facility] faxed. They will contact pt for time & date.

## 2011-12-19 ENCOUNTER — Telehealth: Payer: Self-pay | Admitting: *Deleted

## 2011-12-19 NOTE — Telephone Encounter (Signed)
Confirmed 02/13/12 genetic appt w/ pt.  Took paper to Clydie Braun.

## 2011-12-19 NOTE — Telephone Encounter (Signed)
Patient called me back stating that she looked at her calendar and already has another appt.  Confirmed 02/20/12 genetic appt w/ pt.

## 2011-12-19 NOTE — Telephone Encounter (Signed)
Appointment 03/16/11 @ 2:00 pm.

## 2011-12-21 ENCOUNTER — Other Ambulatory Visit: Payer: Self-pay | Admitting: Internal Medicine

## 2011-12-26 ENCOUNTER — Encounter (HOSPITAL_COMMUNITY): Payer: Self-pay | Admitting: Pharmacist

## 2011-12-27 ENCOUNTER — Encounter (HOSPITAL_COMMUNITY)
Admission: RE | Admit: 2011-12-27 | Discharge: 2011-12-27 | Disposition: A | Discharge status: Home / Self Care | Payer: Medicare Other | Source: Ambulatory Visit | Attending: Gynecology | Admitting: Gynecology

## 2011-12-27 ENCOUNTER — Encounter (HOSPITAL_COMMUNITY): Payer: Self-pay

## 2011-12-27 DIAGNOSIS — R9389 Abnormal findings on diagnostic imaging of other specified body structures: Secondary | ICD-10-CM | POA: Diagnosis not present

## 2011-12-27 DIAGNOSIS — N840 Polyp of corpus uteri: Secondary | ICD-10-CM | POA: Diagnosis not present

## 2011-12-27 HISTORY — DX: Nausea with vomiting, unspecified: R11.2

## 2011-12-27 HISTORY — DX: Other specified postprocedural states: Z98.890

## 2011-12-27 HISTORY — DX: Zoster without complications: B02.9

## 2011-12-27 HISTORY — DX: Shortness of breath: R06.02

## 2011-12-27 HISTORY — DX: Personal history of other medical treatment: Z92.89

## 2011-12-27 HISTORY — DX: Stress incontinence (female) (male): N39.3

## 2011-12-27 HISTORY — DX: Gastro-esophageal reflux disease without esophagitis: K21.9

## 2011-12-27 LAB — BASIC METABOLIC PANEL
BUN: 18 mg/dL (ref 6–23)
CO2: 27 mEq/L (ref 19–32)
Chloride: 101 mEq/L (ref 96–112)
Creatinine, Ser: 0.75 mg/dL (ref 0.50–1.10)
Glucose, Bld: 95 mg/dL (ref 70–99)
Potassium: 3.6 mEq/L (ref 3.5–5.1)

## 2011-12-27 LAB — URINALYSIS, ROUTINE W REFLEX MICROSCOPIC
Bilirubin Urine: NEGATIVE
Glucose, UA: NEGATIVE mg/dL
Hgb urine dipstick: NEGATIVE
Protein, ur: NEGATIVE mg/dL
Urobilinogen, UA: 0.2 mg/dL (ref 0.0–1.0)

## 2011-12-27 LAB — CBC
HCT: 37.9 % (ref 36.0–46.0)
Hemoglobin: 12.5 g/dL (ref 12.0–15.0)
MCH: 26.9 pg (ref 26.0–34.0)
MCHC: 33 g/dL (ref 30.0–36.0)
MCV: 81.7 fL (ref 78.0–100.0)
RDW: 14.3 % (ref 11.5–15.5)

## 2011-12-27 LAB — URINE MICROSCOPIC-ADD ON

## 2011-12-27 NOTE — Pre-Procedure Instructions (Signed)
Reviewed ekg and myoview stress test with Dr August Saucer for lsd-check [facility]

## 2011-12-27 NOTE — Patient Instructions (Addendum)
   Your procedure is scheduled NF:[id number] November 5th  Enter through the Main Entrance of [facility] at:6am Pick up the phone at the desk and dial [phone] and inform us of your arrival.  Please call this number if you have any problems the morning of surgery: [phone]  Remember: Do not eat or drink anything after midnight on Monday You may take your synthroid with sips of water in the morning day of surgery  Do not wear jewelry, make-up, or FINGER nail polish No metal in your hair or on your body. Do not wear lotions, powders, perfumes. You may wear deodorant.  Please use your CHG wash as directed prior to surgery.  Do not shave anywhere for at least 12 hours prior to first CHG shower.  Do not bring valuables to the hospital. Please bring a case to protect your glasses   Patients discharged on the day of surgery will not be allowed to drive home.

## 2012-01-06 NOTE — H&P (Signed)
Amanda [name] Patient is a 71 year old who presented to the office on October 11 with a complaint this year of abdominal bloating and chronic constipation. She has a family history of ovarian and breast cancer in her mother. Patient had described the symptoms to her primary physician. Patient for the past several months has felt bloated and nauseated at times. She states that her abdominal bloating and discomfort is not associated with any meals. She has been followed by the urologist for bladder spasms and has had workup although I do not have the details. Patient is sexually active has no dyspareunia and she has been on Estrace vaginal cream for less than a year which she applies twice a week and has helped with her vaginal atrophy that she had experienced before. Patient states her last colonoscopy was 7-1/2 years ago by Dr. Matthias Hughs and states that she was informed that she had normal studies. Her mammogram was normal in January of this year.  Patient presented today for an ultrasound and further evaluation. The ultrasound demonstrated the following:  Uterus measured 4.9 x 3.0 cm with endometrial stripe of 8 mm a single calcified fibroid measured 10 x 9 mm was noted. Ovaries appeared to be normal. Endometrium was thickened with no polyps seen.  An attempted biopsy in the office in an effort to obtain an endometrial sampling was not successful despite paracervical block. Minimal amount of tissue was obtained and created much discomfort. Patient is being scheduled for a hysteroscopy D&C for further evaluation of the thickened endometrium.  Pertinent Gynecological History:  Menses: post-menopausal  Bleeding: None  Contraception: none  DES exposure: denies  Blood transfusions: none  Sexually transmitted diseases: no past history  Previous GYN Procedures: 3 vaginal deliveries and one C-section  Last mammogram: Date: 2013  Last pap: Obtained today Date: Result pending  OB History: G 4, P 4  Menstrual  History:  Menarche age: 52  No LMP recorded. Patient is postmenopausal.   Past Medical History   Diagnosis  Date   .  Hypothyroidism    .  Hyperlipidemia    .  Constipation, chronic     Past Surgical History   Procedure  Date   .  Tonsillectomy and adenoidectomy    .  Cesarean section  1959   .  Wisdom tooth extraction    .  Cataract extraction  2009, 2011     BOTH EYES   .  Tubal ligation      BY LAPAROSCOPY   .  Dilation and curettage of uterus      X2   .  Carpal tunnel release  1999   .  Colonoscopy      Dr Matthias Hughs    Family History   Problem  Relation  Age of Onset   .  Ovarian cancer  Mother    .  Breast cancer  Mother    .  Hypertension  Father    .  Prostate cancer  Father    .  Hyperlipidemia  Brother    .  Kidney failure  Father     Social History: reports that she has never smoked. She has never used smokeless tobacco. She reports that she drinks alcohol. She reports that she does not use illicit drugs.  Allergies:  Allergies   Allergen  Reactions   .  Levofloxacin      REACTION: Sick on stomach   .  Pravastatin Sodium      REACTION: Sick     (  Not in a hospital admission)  REVIEW OF SYSTEMS: A ROS was performed and pertinent positives and negatives are included in the history.  GENERAL: No fevers or chills. HEENT: No change in vision, no earache, sore throat or sinus congestion. NECK: No pain or stiffness. CARDIOVASCULAR: No chest pain or pressure. No palpitations. PULMONARY: No shortness of breath, cough or wheeze. GASTROINTESTINAL: No abdominal pain, nausea, vomiting or diarrhea, melena or bright red blood per rectum. GENITOURINARY: No urinary frequency, urgency, hesitancy or dysuria. MUSCULOSKELETAL: No joint or muscle pain, no back pain, no recent trauma. DERMATOLOGIC: No rash, no itching, no lesions. ENDOCRINE: No polyuria, polydipsia, no heat or cold intolerance. No recent change in weight. HEMATOLOGICAL: No anemia or easy bruising or bleeding.  NEUROLOGIC: No headache, seizures, numbness, tingling or weakness. PSYCHIATRIC: No depression, no loss of interest in normal activity or change in sleep pattern.  There were no vitals taken for this visit.  Physical Exam:  HEENT:unremarkable  Neck:Supple, midline, no thyroid megaly, no carotid bruits  Lungs: Clear to auscultation no rhonchi's or wheezes  Heart:Regular rate and rhythm, no murmurs or gallops  Breast Exam: Examined in January normal  Abdomen: Soft nontender no rebound or guarding  Pelvic:BUS within normal limits  Vagina: Mild atrophic changes  Cervix: No gross lesions  Uterus: Axial  Adnexa: No palpable masses or tenderness  Extremities: No cords, no edema  Rectal: Unremarkable  No results found for this or any previous visit (from the past 24 hour(s)).  No results found.   Assessment/Plan:  Patient with bloating sensation and family history of breast ovarian cancer who's ultrasound today demonstrated thickened endometrium 8 mm in size. Unsuccessful in obtaining adequate sampling of the uterine lining today so patient will be scheduled for hysteroscopy D&C. The risk of the operation outlined below as follows:   Patient was counseled as to the risk of surgery to include the following:  1. Infection (prohylactic antibiotics will be administered)  2. DVT/Pulmonary Embolism (prophylactic pneumo compression stockings will be used)  3.Trauma to internal organs requiring additional surgical procedure to repair any injury to  Internal organs requiring perhaps additional hospitalization days.  4.Hemmorhage requiring transfusion and blood products which carry risks such as anaphylactic reaction, hepatitis and AIDS  Patient had received literature information on the procedure scheduled and all her questions were answered and accepts all risk.

## 2012-01-07 ENCOUNTER — Encounter (HOSPITAL_COMMUNITY): Payer: Self-pay | Admitting: Anesthesiology

## 2012-01-07 ENCOUNTER — Ambulatory Visit (HOSPITAL_COMMUNITY)
Admission: RE | Admit: 2012-01-07 | Discharge: 2012-01-07 | Disposition: A | Discharge status: Home / Self Care | Payer: Medicare Other | Source: Ambulatory Visit | Attending: Gynecology | Admitting: Gynecology

## 2012-01-07 ENCOUNTER — Encounter (HOSPITAL_COMMUNITY)
Admission: RE | Disposition: A | Discharge status: Home / Self Care | Payer: Self-pay | Source: Ambulatory Visit | Attending: Gynecology

## 2012-01-07 ENCOUNTER — Ambulatory Visit (HOSPITAL_COMMUNITY): Payer: Medicare Other | Admitting: Anesthesiology

## 2012-01-07 DIAGNOSIS — M899 Disorder of bone, unspecified: Secondary | ICD-10-CM

## 2012-01-07 DIAGNOSIS — R9389 Abnormal findings on diagnostic imaging of other specified body structures: Secondary | ICD-10-CM

## 2012-01-07 DIAGNOSIS — E039 Hypothyroidism, unspecified: Secondary | ICD-10-CM

## 2012-01-07 DIAGNOSIS — G5600 Carpal tunnel syndrome, unspecified upper limb: Secondary | ICD-10-CM

## 2012-01-07 DIAGNOSIS — Z803 Family history of malignant neoplasm of breast: Secondary | ICD-10-CM

## 2012-01-07 DIAGNOSIS — Z85828 Personal history of other malignant neoplasm of skin: Secondary | ICD-10-CM

## 2012-01-07 DIAGNOSIS — M6281 Muscle weakness (generalized): Secondary | ICD-10-CM

## 2012-01-07 DIAGNOSIS — R32 Unspecified urinary incontinence: Secondary | ICD-10-CM

## 2012-01-07 DIAGNOSIS — M949 Disorder of cartilage, unspecified: Secondary | ICD-10-CM

## 2012-01-07 DIAGNOSIS — K5909 Other constipation: Secondary | ICD-10-CM

## 2012-01-07 DIAGNOSIS — R269 Unspecified abnormalities of gait and mobility: Secondary | ICD-10-CM

## 2012-01-07 DIAGNOSIS — M542 Cervicalgia: Secondary | ICD-10-CM

## 2012-01-07 DIAGNOSIS — Z8041 Family history of malignant neoplasm of ovary: Secondary | ICD-10-CM

## 2012-01-07 DIAGNOSIS — N8500 Endometrial hyperplasia, unspecified: Secondary | ICD-10-CM | POA: Diagnosis not present

## 2012-01-07 DIAGNOSIS — N840 Polyp of corpus uteri: Secondary | ICD-10-CM | POA: Insufficient documentation

## 2012-01-07 DIAGNOSIS — Z780 Asymptomatic menopausal state: Secondary | ICD-10-CM

## 2012-01-07 DIAGNOSIS — E785 Hyperlipidemia, unspecified: Secondary | ICD-10-CM

## 2012-01-07 DIAGNOSIS — K589 Irritable bowel syndrome without diarrhea: Secondary | ICD-10-CM

## 2012-01-07 DIAGNOSIS — R140 Abdominal distension (gaseous): Secondary | ICD-10-CM

## 2012-01-07 HISTORY — PX: HYSTEROSCOPY WITH D & C: SHX1775

## 2012-01-07 SURGERY — DILATATION AND CURETTAGE /HYSTEROSCOPY
Anesthesia: General | Site: Uterus | Wound class: Clean Contaminated

## 2012-01-07 MED ORDER — KETOROLAC TROMETHAMINE 15 MG/ML IJ SOLN
15.0000 mg | Freq: Once | INTRAMUSCULAR | Status: DC
  Filled 2012-01-07: qty 1

## 2012-01-07 MED ORDER — KETOROLAC TROMETHAMINE 30 MG/ML IJ SOLN
INTRAMUSCULAR | Status: AC
  Administered 2012-01-07: 15 mg
  Filled 2012-01-07: qty 1

## 2012-01-07 MED ORDER — FENTANYL CITRATE 0.05 MG/ML IJ SOLN
INTRAMUSCULAR | Status: AC
  Filled 2012-01-07: qty 4

## 2012-01-07 MED ORDER — FENTANYL CITRATE 0.05 MG/ML IJ SOLN
INTRAMUSCULAR | Status: AC
  Administered 2012-01-07: 25 ug via INTRAVENOUS
  Filled 2012-01-07: qty 2

## 2012-01-07 MED ORDER — SCOPOLAMINE 1 MG/3DAYS TD PT72: 1.0000 | MEDICATED_PATCH | Freq: Once | TRANSDERMAL | Status: DC

## 2012-01-07 MED ORDER — DIPHENHYDRAMINE HCL 50 MG/ML IJ SOLN
INTRAMUSCULAR | Status: AC
  Filled 2012-01-07: qty 1

## 2012-01-07 MED ORDER — DIPHENHYDRAMINE HCL 50 MG/ML IJ SOLN
INTRAMUSCULAR | Status: DC | PRN
  Administered 2012-01-07 (×2): 12.5 mg via INTRAVENOUS

## 2012-01-07 MED ORDER — METOCLOPRAMIDE HCL 5 MG/ML IJ SOLN: 10.0000 mg | Freq: Once | INTRAMUSCULAR | Status: DC | PRN

## 2012-01-07 MED ORDER — METOCLOPRAMIDE HCL 10 MG PO TABS: 10.0000 mg | ORAL_TABLET | Freq: Three times a day (TID) | ORAL | Status: DC

## 2012-01-07 MED ORDER — LIDOCAINE HCL (CARDIAC) 20 MG/ML IV SOLN
INTRAVENOUS | Status: AC
  Filled 2012-01-07: qty 5

## 2012-01-07 MED ORDER — GLYCOPYRROLATE 0.2 MG/ML IJ SOLN
INTRAMUSCULAR | Status: DC | PRN
  Administered 2012-01-07: 0.2 mg via INTRAVENOUS

## 2012-01-07 MED ORDER — PROPOFOL 10 MG/ML IV EMUL
INTRAVENOUS | Status: AC
  Filled 2012-01-07: qty 20

## 2012-01-07 MED ORDER — FENTANYL CITRATE 0.05 MG/ML IJ SOLN
INTRAMUSCULAR | Status: DC | PRN
  Administered 2012-01-07: 50 ug via INTRAVENOUS
  Administered 2012-01-07: 25 ug via INTRAVENOUS
  Administered 2012-01-07: 50 ug via INTRAVENOUS

## 2012-01-07 MED ORDER — ONDANSETRON HCL 4 MG/2ML IJ SOLN
INTRAMUSCULAR | Status: AC
  Filled 2012-01-07: qty 2

## 2012-01-07 MED ORDER — FENTANYL CITRATE 0.05 MG/ML IJ SOLN
25.0000 ug | INTRAMUSCULAR | Status: DC | PRN
  Administered 2012-01-07: 25 ug via INTRAVENOUS
  Administered 2012-01-07: 50 ug via INTRAVENOUS
  Administered 2012-01-07: 25 ug via INTRAVENOUS

## 2012-01-07 MED ORDER — LACTATED RINGERS IV SOLN
INTRAVENOUS | Status: DC
  Administered 2012-01-07: 07:00:00 via INTRAVENOUS

## 2012-01-07 MED ORDER — FENTANYL CITRATE 0.05 MG/ML IJ SOLN
INTRAMUSCULAR | Status: AC
  Filled 2012-01-07: qty 2

## 2012-01-07 MED ORDER — LACTATED RINGERS IV SOLN
INTRAVENOUS | Status: DC | PRN
  Administered 2012-01-07 (×2): via INTRAVENOUS

## 2012-01-07 MED ORDER — MEPERIDINE HCL 25 MG/ML IJ SOLN: 6.2500 mg | INTRAMUSCULAR | Status: DC | PRN

## 2012-01-07 MED ORDER — LIDOCAINE HCL (CARDIAC) 20 MG/ML IV SOLN
INTRAVENOUS | Status: DC | PRN
  Administered 2012-01-07: 20 mg via INTRAVENOUS

## 2012-01-07 MED ORDER — GLYCOPYRROLATE 0.2 MG/ML IJ SOLN
INTRAMUSCULAR | Status: AC
  Filled 2012-01-07: qty 2

## 2012-01-07 MED ORDER — CEFAZOLIN SODIUM-DEXTROSE 2-3 GM-% IV SOLR
INTRAVENOUS | Status: AC
  Filled 2012-01-07: qty 50

## 2012-01-07 MED ORDER — CEFAZOLIN SODIUM-DEXTROSE 2-3 GM-% IV SOLR
2.0000 g | INTRAVENOUS | Status: AC
  Administered 2012-01-07: 2 g via INTRAVENOUS

## 2012-01-07 MED ORDER — SCOPOLAMINE 1 MG/3DAYS TD PT72
MEDICATED_PATCH | TRANSDERMAL | Status: AC
  Filled 2012-01-07: qty 1

## 2012-01-07 MED ORDER — PROPOFOL 10 MG/ML IV EMUL
INTRAVENOUS | Status: DC | PRN
  Administered 2012-01-07 (×2): 10 mg via INTRAVENOUS
  Administered 2012-01-07: 50 mg via INTRAVENOUS
  Administered 2012-01-07: 100 mg via INTRAVENOUS
  Administered 2012-01-07: 10 mg via INTRAVENOUS
  Administered 2012-01-07: 20 mg via INTRAVENOUS

## 2012-01-07 MED ORDER — GLYCINE 1.5 % IR SOLN
Status: DC | PRN
  Administered 2012-01-07: 3000 mL

## 2012-01-07 SURGICAL SUPPLY — 22 items
CANISTER SUCTION 2500CC (MISCELLANEOUS) ×2 IMPLANT
CATH FOLEY 2WAY  3CC  8FR (CATHETERS) ×1
CATH FOLEY 2WAY 3CC 8FR (CATHETERS) ×1 IMPLANT
CATH ROBINSON RED A/P 16FR (CATHETERS) ×2 IMPLANT
CLOTH BEACON ORANGE TIMEOUT ST (SAFETY) ×2 IMPLANT
CONTAINER PREFILL 10% NBF 60ML (FORM) ×4 IMPLANT
CORD ACTIVE DISPOSABLE (ELECTRODE)
CORD ELECTRO ACTIVE DISP (ELECTRODE) IMPLANT
DRESSING TELFA 8X3 (GAUZE/BANDAGES/DRESSINGS) ×2 IMPLANT
ELECT REM PT RETURN 9FT ADLT (ELECTROSURGICAL)
ELECT VAPORTRODE GRVD BAR (ELECTRODE) IMPLANT
ELECTRODE REM PT RTRN 9FT ADLT (ELECTROSURGICAL) IMPLANT
GLOVE BIOGEL PI IND STRL 8 (GLOVE) ×1 IMPLANT
GLOVE BIOGEL PI INDICATOR 8 (GLOVE) ×1
GLOVE ECLIPSE 7.5 STRL STRAW (GLOVE) ×4 IMPLANT
GOWN STRL REIN XL XLG (GOWN DISPOSABLE) ×4 IMPLANT
PACK HYSTEROSCOPY LF (CUSTOM PROCEDURE TRAY) ×2 IMPLANT
PAD OB MATERNITY 4.3X12.25 (PERSONAL CARE ITEMS) ×2 IMPLANT
PAD PREP 24X48 CUFFED NSTRL (MISCELLANEOUS) ×2 IMPLANT
PLUG CATH AND CAP STER (CATHETERS) ×1 IMPLANT
TOWEL OR 17X24 6PK STRL BLUE (TOWEL DISPOSABLE) ×4 IMPLANT
WATER STERILE IRR 1000ML POUR (IV SOLUTION) ×2 IMPLANT

## 2012-01-07 NOTE — Anesthesia Postprocedure Evaluation (Signed)
Anesthesia Post Note  Patient: Amanda [name]  Procedure(s) Performed: Procedure(s) (LRB): DILATATION AND CURETTAGE /HYSTEROSCOPY (N/A)  Anesthesia type: General  Patient location: PACU  Post pain: Pain level controlled  Post assessment: Post-op Vital signs reviewed  Last Vitals:  Filed Vitals:   01/07/12 0942  BP: 166/90  Pulse: 79  Temp:   Resp: 16    Post vital signs: Reviewed  Level of consciousness: sedated  Complications: No apparent anesthesia complications

## 2012-01-07 NOTE — Interval H&P Note (Signed)
History and Physical Interval Note:  01/07/2012 7:15 AM  Amanda [name]  has presented today for surgery, with the diagnosis of thickened endometrium  The various methods of treatment have been discussed with the patient and family. After consideration of risks, benefits and other options for treatment, the patient has consented to  Procedure(s) (LRB) with comments: DILATATION AND CURETTAGE /HYSTEROSCOPY (N/A) as a surgical intervention .  The patient's history has been reviewed, patient examined, no change in status, stable for surgery.  I have reviewed the patient's chart and labs.  Questions were answered to the patient's satisfaction.     Ok Edwards

## 2012-01-07 NOTE — Op Note (Signed)
01/07/2012  8:09 AM  PATIENT:  Amanda [name]  71 y.o. female  PRE-OPERATIVE DIAGNOSIS:  thickened endometrium  POST-OPERATIVE DIAGNOSIS:  thickened endometrium  PROCEDURE:  Procedure(s): DILATATION AND CURETTAGE /HYSTEROSCOPY removal of endometrial polyp  SURGEON:  Surgeon(s): Ok Edwards, MD  ANESTHESIA:   general  FINDINGS: Large endometrial polyp noted in the intrauterine cavity near the lower uterine segment. Both tubal ostia identified no other abnormalities were noted in the cervical canal was clear.  DESCRIPTION OF OPERATION: The patient was taken to the operating room where she underwent a successful general endotracheal anesthesia. Time out was accomplished identifying the patient, her allergies and planned operation. Patient had PAS stockings for DVT prophylaxis and received 2 g of Ancef as well. She was placed in the high lithotomy position the vagina and perineum were prepped and draped in usual sterile fashion bimanual examination demonstrated a slightly retroverted uterus. A red rubber Roxan Hockey was inserted into the bladder to evacuate its contents for approximately 200 cc. A weighted speculum was placed in the posterior vaginal vault and Sims retractor in the anterior vaginal wall. A single-tooth tenaculum was placed on the anterior cervical lip. The cervix was dilated to a Pratt dilator 22 mm. The diagnostic scope was introduced into the intrauterine cavity. Glycine 1.5% was the distending media. A systematic inspection of the intrauterine cavity demonstrated a polyp sitting in the lower uterine segment with an anterior uterine wall attachment. Pictures were obtained. Due to the patient's narrow vagina and cervical canal ovum forceps was introduced into the intrauterine cavity the polyp was grasped and twisted office its pedicle and submitted for histological evaluation. After this a hunters curet small size was used to obtain endometrial sampling this was submitted separately  for histological evaluation was well inspection of the intrauterine cavity demonstrated both tubal ostia smooth and major and an endocervical canal. The single-tooth tenaculum was removed a 5 cc Foley catheter was introduced into the intrauterine cavity for additional tamponade effect which will be removed in the recovery room. Patient was extubated transferred to recovery stable vital signs are Roxan Hockey was reinserted into the bladder and approximately 50 cc of clear urine was released. 70 cc was the fluid deficit on the 1.5% glycine. Blood loss was minimal.  ESTIMATED BLOOD LOSS: Minimal  Intake/Output Summary (Last 24 hours) at 01/07/12 0809 Last data filed at 01/07/12 0759  Gross per 24 hour  Intake   1000 ml  Output    500 ml  Net    500 ml     BLOOD ADMINISTERED:none   LOCAL MEDICATIONS USED:  NONE  SPECIMEN:  Source of Specimen:  #1 endometrial polyp #2 endometrial curettings  DISPOSITION OF SPECIMEN:  PATHOLOGY  COUNTS:  YES  PLAN OF CARE: Transfer to PACU  [facility] HMD8:09 AMTD@

## 2012-01-07 NOTE — Progress Notes (Signed)
Pt does not have picc line

## 2012-01-07 NOTE — Anesthesia Preprocedure Evaluation (Addendum)
Anesthesia Evaluation  Patient identified by MRN, date of birth, ID band Patient awake    Reviewed: Allergy & Precautions, H&P , NPO status , Patient's Chart, lab work & pertinent test results  History of Anesthesia Complications (+) PONV  Airway Mallampati: III TM Distance: >3 FB Neck ROM: Full    Dental No notable dental hx. (+) Teeth Intact   Pulmonary shortness of breath and with exertion,  breath sounds clear to auscultation  Pulmonary exam normal       Cardiovascular negative cardio ROS  Rhythm:Regular Rate:Normal     Neuro/Psych Gait Disturbance  Neuromuscular disease negative psych ROS   GI/Hepatic Neg liver ROS, GERD-  Medicated and Controlled,  Endo/Other  Hypothyroidism Hyperlipidemia  Renal/GU negative Renal ROS  negative genitourinary   Musculoskeletal negative musculoskeletal ROS (+)   Abdominal   Peds  Hematology negative hematology ROS (+)   Anesthesia Other Findings   Reproductive/Obstetrics Thickened Endometrium                          Anesthesia Physical Anesthesia Plan  ASA: II  Anesthesia Plan: General   Post-op Pain Management:    Induction: Intravenous  Airway Management Planned: LMA  Additional Equipment:   Intra-op Plan:   Post-operative Plan: Extubation in OR  Informed Consent: I have reviewed the patients History and Physical, chart, labs and discussed the procedure including the risks, benefits and alternatives for the proposed anesthesia with the patient or authorized representative who has indicated his/her understanding and acceptance.   Dental advisory given  Plan Discussed with: CRNA, Anesthesiologist and Surgeon  Anesthesia Plan Comments:        Anesthesia Quick Evaluation

## 2012-01-07 NOTE — Transfer of Care (Signed)
Immediate Anesthesia Transfer of Care Note  Patient: Amanda [name]  Procedure(s) Performed: Procedure(s) (LRB) with comments: DILATATION AND CURETTAGE /HYSTEROSCOPY (N/A) - intrauterine foley catheter for tamponode   Patient Location: PACU  Anesthesia Type:General  Level of Consciousness: awake, sedated and patient cooperative  Airway & Oxygen Therapy: Patient Spontanous Breathing and Patient connected to nasal cannula oxygen  Post-op Assessment: Report given to PACU RN and Post -op Vital signs reviewed and stable  Post vital signs: Reviewed and stable  Complications: No apparent anesthesia complications

## 2012-01-07 NOTE — Interval H&P Note (Signed)
History and Physical Interval Note:  01/07/2012 7:15 AM  Amanda [name]  has presented today for surgery, with the diagnosis of thickened endometrium  The various methods of treatment have been discussed with the patient and family. After consideration of risks, benefits and other options for treatment, the patient has consented to  Procedure(s) (LRB) with comments: DILATATION AND CURETTAGE /HYSTEROSCOPY (N/A) as a surgical intervention .  The patient's history has been reviewed, patient examined, no change in status, stable for surgery.  I have reviewed the patient's chart and labs.  Questions were answered to the patient's satisfaction.     FERNANDEZ,JUAN H   

## 2012-01-08 ENCOUNTER — Encounter (HOSPITAL_COMMUNITY): Payer: Self-pay | Admitting: Gynecology

## 2012-01-09 DIAGNOSIS — R141 Gas pain: Secondary | ICD-10-CM | POA: Diagnosis not present

## 2012-01-09 DIAGNOSIS — K5901 Slow transit constipation: Secondary | ICD-10-CM | POA: Diagnosis not present

## 2012-01-09 DIAGNOSIS — Z1211 Encounter for screening for malignant neoplasm of colon: Secondary | ICD-10-CM | POA: Diagnosis not present

## 2012-01-09 DIAGNOSIS — R142 Eructation: Secondary | ICD-10-CM | POA: Diagnosis not present

## 2012-01-21 DIAGNOSIS — R351 Nocturia: Secondary | ICD-10-CM | POA: Diagnosis not present

## 2012-01-21 DIAGNOSIS — N3946 Mixed incontinence: Secondary | ICD-10-CM | POA: Diagnosis not present

## 2012-01-22 ENCOUNTER — Encounter: Payer: Self-pay | Admitting: Gynecology

## 2012-01-22 ENCOUNTER — Ambulatory Visit (INDEPENDENT_AMBULATORY_CARE_PROVIDER_SITE_OTHER): Payer: Medicare Other | Admitting: Gynecology

## 2012-01-22 VITALS — BP 139/80

## 2012-01-22 DIAGNOSIS — Z9889 Other specified postprocedural states: Secondary | ICD-10-CM

## 2012-01-22 NOTE — Progress Notes (Signed)
71 year old patient status post resectoscopic polypectomy D&C on 01/07/2012 doing well with no complaints. Pathology report as follows:   Diagnosis 1. Endometrial polyp - BENIGN ENDOMETRIAL POLYP, NO ATYPIA, HYPERPLASIA OR MALIGNANCY. 2. Endometrium, curettage - SCANTY SUPERFICIAL FRAGMENTS OF ATROPHIC ENDOMETRIAL GLANDS (VERY LIMITED MATERIAL). - DETACHED FRAGMENTS OF BENIGN SQUAMOUS MUCOSA, NO DYSPLASIA OR MALIGNANCY. Microscopic Comment 2. Sections show superficial fragments of atrophic endometrial glands. Intact glandular and stromal components are not identified. In this setting, it is difficult to evaluate for hyperplasia. There is no evidence of atypia or malignancy identified. Exam: Abdomen: Soft nontender no rebound or guarding Bartholin urethra Skene was within normal limits Vagina: No lesions or discharge Cervix: No lesions or discharge Uterus: Axial normal size shape and consistency Adnexa: No palpable masses or tenderness Rectal exam: Not examined  Assessment/plan: Patient 2 weeks status post resectoscopic polypectomy with benign pathology. Patient scheduled to return back in February of 2014 for annual exam.

## 2012-02-13 ENCOUNTER — Other Ambulatory Visit: Payer: Medicare Other | Admitting: Lab

## 2012-02-13 ENCOUNTER — Encounter: Payer: Medicare Other | Admitting: Genetic Counselor

## 2012-02-17 ENCOUNTER — Telehealth: Payer: Self-pay | Admitting: Internal Medicine

## 2012-02-17 NOTE — Telephone Encounter (Signed)
Patient calling to see if Dr. Alwyn Ren will prescribe her the Welbutrin that she used before.  States that she cannot focus and is irritable and would like to try this again.  States that her last OV was 6 months ago.  I told her that I would ask but that she may be required to come back in for an office visit.

## 2012-02-18 MED ORDER — BUPROPION HCL 75 MG PO TABS: 75.0000 mg | ORAL_TABLET | Freq: Every day | ORAL | Status: DC

## 2012-02-18 NOTE — Telephone Encounter (Signed)
Bupropion 75 mg one daily dispense 30, refill x1

## 2012-02-18 NOTE — Telephone Encounter (Signed)
Hopp please advise  

## 2012-02-18 NOTE — Telephone Encounter (Signed)
Left message on VM for patient to return call to verify pharmacy

## 2012-02-18 NOTE — Telephone Encounter (Signed)
RX sent

## 2012-02-18 NOTE — Telephone Encounter (Signed)
Left message for patient to return call when available to verify pharmacy

## 2012-02-18 NOTE — Telephone Encounter (Signed)
Pt returned your call. She verified that she uses [facility] on [address].

## 2012-02-20 ENCOUNTER — Encounter: Payer: Medicare Other | Admitting: Genetic Counselor

## 2012-02-20 ENCOUNTER — Other Ambulatory Visit: Payer: Medicare Other | Admitting: Lab

## 2012-03-10 ENCOUNTER — Encounter: Payer: Self-pay | Admitting: Gynecology

## 2012-03-10 ENCOUNTER — Ambulatory Visit (INDEPENDENT_AMBULATORY_CARE_PROVIDER_SITE_OTHER): Payer: Medicare Other | Admitting: Gynecology

## 2012-03-10 VITALS — BP 136/78 | Ht 61.5 in | Wt 153.0 lb

## 2012-03-10 DIAGNOSIS — M8580 Other specified disorders of bone density and structure, unspecified site: Secondary | ICD-10-CM | POA: Insufficient documentation

## 2012-03-10 DIAGNOSIS — Z780 Asymptomatic menopausal state: Secondary | ICD-10-CM | POA: Insufficient documentation

## 2012-03-10 DIAGNOSIS — Z8041 Family history of malignant neoplasm of ovary: Secondary | ICD-10-CM

## 2012-03-10 DIAGNOSIS — M899 Disorder of bone, unspecified: Secondary | ICD-10-CM

## 2012-03-10 DIAGNOSIS — M949 Disorder of cartilage, unspecified: Secondary | ICD-10-CM

## 2012-03-10 DIAGNOSIS — Z803 Family history of malignant neoplasm of breast: Secondary | ICD-10-CM | POA: Diagnosis not present

## 2012-03-10 DIAGNOSIS — N952 Postmenopausal atrophic vaginitis: Secondary | ICD-10-CM

## 2012-03-10 MED ORDER — NYSTATIN-TRIAMCINOLONE 100000-0.1 UNIT/GM-% EX CREA
TOPICAL_CREAM | Freq: Three times a day (TID) | CUTANEOUS | Status: DC
Start: 2012-03-10 — End: 2012-06-24

## 2012-03-10 NOTE — Progress Notes (Signed)
Amanda [name] 08-30-1940 [id number]   History:    72 y.o. who presented to the office complaining of some pruritus near her introitus on and off. Patient is status post dilatation and curettage and removal of endometrial polyp on 01/07/2012 as a result of a thickened endometrium. Pathology reported demonstrated the following:  Diagnosis 1. Endometrial polyp - BENIGN ENDOMETRIAL POLYP, NO ATYPIA, HYPERPLASIA OR MALIGNANCY. 2. Endometrium, curettage - SCANTY SUPERFICIAL FRAGMENTS OF ATROPHIC ENDOMETRIAL GLANDS (VERY LIMITED MATERIAL). - DETACHED FRAGMENTS OF BENIGN SQUAMOUS MUCOSA, NO DYSPLASIA OR MALIGNANCY. Microscopic Comment 2. Sections show superficial fragments of atrophic endometrial glands. Intact glandular and stromal components are not identified. In this setting, it is difficult to evaluate for hyperplasia. There is no evidence of atypia or malignancy identified.  Patient denies any vaginal bleeding. Her primary physician is Dr. Marga Melnick who has been treating her for hyperlipidemia as well as hypothyroidism. Patient been followed by her dermatologist an annual basis. Review of her record indicated that she came off the Fosamax in 2010 recent vitamin D level was normal at 38 in January of 2013 which was the time she had her last bone density study. She is currently on a drug-free holiday for her osteopenia.  Patient's shingles vaccine and Tdap vaccine and Pneumovax are all up-to-date. Patient declined flu vaccine.  Colonoscopy 10 years ago   Past medical history,surgical history, family history and social history were all reviewed and documented in the EPIC chart.  Gynecologic History No LMP recorded. Patient is postmenopausal. Contraception: post menopausal status Last Pap: November 2013. Results were: normal Last mammogram: January 2013. Results were: normal  Obstetric History OB History    Grav Para Term Preterm Abortions TAB SAB Ect Mult Living   4 4  3      3      # Outc Date GA Lbr Len/2nd Wgt Sex Del Anes PTL Lv   1 PRE     F SVD  No Yes   2 PRE     M SVD  Yes Yes   3 PRE     F SVD  Yes Yes   4 PAR     M CS   SB       ROS: A ROS was performed and pertinent positives and negatives are included in the history.  GENERAL: No fevers or chills. HEENT: No change in vision, no earache, sore throat or sinus congestion. NECK: No pain or stiffness. CARDIOVASCULAR: No chest pain or pressure. No palpitations. PULMONARY: No shortness of breath, cough or wheeze. GASTROINTESTINAL: No abdominal pain, nausea, vomiting or diarrhea, melena or bright red blood per rectum. GENITOURINARY: No urinary frequency, urgency, hesitancy or dysuria. MUSCULOSKELETAL: No joint or muscle pain, no back pain, no recent trauma. DERMATOLOGIC: No rash, no itching, no lesions. ENDOCRINE: No polyuria, polydipsia, no heat or cold intolerance. No recent change in weight. HEMATOLOGICAL: No anemia or easy bruising or bleeding. NEUROLOGIC: No headache, seizures, numbness, tingling or weakness. PSYCHIATRIC: No depression, no loss of interest in normal activity or change in sleep pattern.     Exam: chaperone present  BP 136/78  Ht 5' 1.5" (1.562 m)  Wt 153 lb (69.4 kg)  BMI 28.44 kg/m2  Body mass index is 28.44 kg/(m^2).  General appearance : Well developed well nourished female. No acute distress HEENT: Neck supple, trachea midline, no carotid bruits, no thyroidmegaly Lungs: Clear to auscultation, no rhonchi or wheezes, or rib retractions  Heart: Regular rate and rhythm, no murmurs or gallops Breast:Examined in  sitting and supine position were symmetrical in appearance, no palpable masses or tenderness,  no skin retraction, no nipple inversion, no nipple discharge, no skin discoloration, no axillary or supraclavicular lymphadenopathy Abdomen: no palpable masses or tenderness, no rebound or guarding Extremities: no edema or skin discoloration or tenderness  Pelvic:  Bartholin, Urethra,  Skene Glands: Within normal limits. Nonspecific dermatitis at the area the fourchette             Vagina: No gross lesions or discharge, vaginal atrophy  Cervix: No gross lesions or discharge  Uterus  anteverted, normal size, shape and consistency, non-tender and mobile  Adnexa  Without masses or tenderness  Anus and perineum  normal   Rectovaginal  normal sphincter tone without palpated masses or tenderness             Hemoccult cards were no provided since she is going to schedule her colonoscopy within a month.     Assessment/Plan:  72 y.o. with vaginal atrophy and nonspecific dermatitis at the area of the fourchette. She will be placed on mytrex cream to apply twice a day for 7-10 days. She will make her appointment with Dr. Matthias Hughs her gastroenterologist for her colonoscopy in the next few weeks. Next year she will need her bone density study. We discussed importance of calcium vitamin D along with regular exercise for osteoporosis prevention. We discussed also the importance of monthly self breast examination and requisition was provided for to schedule her mammogram as well.    Ok Edwards MD, 5:18 PM 03/10/2012

## 2012-03-11 DIAGNOSIS — C4441 Basal cell carcinoma of skin of scalp and neck: Secondary | ICD-10-CM | POA: Diagnosis not present

## 2012-03-11 DIAGNOSIS — D485 Neoplasm of uncertain behavior of skin: Secondary | ICD-10-CM | POA: Diagnosis not present

## 2012-03-11 DIAGNOSIS — L821 Other seborrheic keratosis: Secondary | ICD-10-CM | POA: Diagnosis not present

## 2012-03-11 DIAGNOSIS — Z85828 Personal history of other malignant neoplasm of skin: Secondary | ICD-10-CM | POA: Diagnosis not present

## 2012-03-11 DIAGNOSIS — L570 Actinic keratosis: Secondary | ICD-10-CM | POA: Diagnosis not present

## 2012-03-11 DIAGNOSIS — D239 Other benign neoplasm of skin, unspecified: Secondary | ICD-10-CM | POA: Diagnosis not present

## 2012-03-13 ENCOUNTER — Other Ambulatory Visit: Payer: Self-pay | Admitting: Gynecology

## 2012-03-13 DIAGNOSIS — Z1231 Encounter for screening mammogram for malignant neoplasm of breast: Secondary | ICD-10-CM

## 2012-03-19 DIAGNOSIS — Z961 Presence of intraocular lens: Secondary | ICD-10-CM | POA: Diagnosis not present

## 2012-03-19 DIAGNOSIS — H40019 Open angle with borderline findings, low risk, unspecified eye: Secondary | ICD-10-CM | POA: Diagnosis not present

## 2012-03-19 DIAGNOSIS — H35039 Hypertensive retinopathy, unspecified eye: Secondary | ICD-10-CM | POA: Diagnosis not present

## 2012-03-19 DIAGNOSIS — H353190 Nonexudative age-related macular degeneration, unspecified eye, stage unspecified: Secondary | ICD-10-CM | POA: Diagnosis not present

## 2012-04-04 ENCOUNTER — Other Ambulatory Visit: Payer: Self-pay | Admitting: Internal Medicine

## 2012-04-09 ENCOUNTER — Ambulatory Visit
Admission: RE | Admit: 2012-04-09 | Discharge: 2012-04-09 | Disposition: A | Discharge status: Home / Self Care | Payer: Medicare Other | Source: Ambulatory Visit | Attending: Gynecology | Admitting: Gynecology

## 2012-04-09 DIAGNOSIS — Z1231 Encounter for screening mammogram for malignant neoplasm of breast: Secondary | ICD-10-CM | POA: Diagnosis not present

## 2012-04-18 ENCOUNTER — Other Ambulatory Visit: Payer: Self-pay

## 2012-05-06 DIAGNOSIS — Z1211 Encounter for screening for malignant neoplasm of colon: Secondary | ICD-10-CM | POA: Diagnosis not present

## 2012-06-01 ENCOUNTER — Telehealth: Payer: Self-pay | Admitting: Internal Medicine

## 2012-06-01 NOTE — Telephone Encounter (Signed)
Hopp please advise on patient request for prescribed cough medication

## 2012-06-01 NOTE — Telephone Encounter (Signed)
Patient Information:  Caller Name: Madysen  Phone: [phone]  Patient: Amanda [name]  Gender: Female  DOB: November 21, 1940  Age: 72 Years  PCP: Marga Melnick  Office Follow Up:  Does the office need to follow up with this patient?: Yes  Instructions For The Office: Requesting Cough Medication with Codiene to help rest -Cold Sx/ frequent cough  RN Note:  Requesting Rx Strength Cough Medicine be called into [facility] on [address] [phone]  Symptoms  Reason For Call & Symptoms: Cold Sx onset 05/30/12 and has frequent productive cough and is having trouble sleeping. Throat hurting from frequent coughing. Afebrile.  Reviewed Health History In EMR: Yes  Reviewed Medications In EMR: Yes  Reviewed Allergies In EMR: Yes  Reviewed Surgeries / Procedures: Yes  Date of Onset of Symptoms: 05/30/2012  Treatments Tried: DayQuill  Treatments Tried Worked: No  Guideline(s) Used:  Cough  Disposition Per Guideline:   Home Care  Reason For Disposition Reached:   Cough with no complications  Advice Given:  Reassurance  Coughing is the way that our lungs remove irritants and mucus. It helps protect our lungs from getting pneumonia.  You can get a dry hacking cough after a chest cold. Sometimes this type of cough can last 1-3 weeks, and be worse at night.  You can also get a cough after being exposed to irritating substances like smoke, strong perfumes, and dust.  Cough Medicines:  OTC Cough Syrups: The most common cough suppressant in OTC cough medications is dextromethorphan. Often the letters "DM" appear in the name.  OTC Cough Drops: Cough drops can help a lot, especially for mild coughs. They reduce coughing by soothing your irritated throat and removing that tickle sensation in the back of the throat. Cough drops also have the advantage of portability - you can carry them with you.  Coughing Spasms:  Drink warm fluids. Inhale warm mist (Reason: both relax the  airway and loosen up the phlegm).  Suck on cough drops or hard candy to coat the irritated throat.  Prevent Dehydration:  Drink adequate liquids.  This will help soothe an irritated or dry throat and loosen up the phlegm.  Avoid Tobacco Smoke:  Smoking or being exposed to smoke makes coughs much worse.  Call Back If:  Difficulty breathing  Cough lasts more than 3 weeks  Fever lasts > 3 days  You become worse.  Patient Will Follow Care Advice:  YES

## 2012-06-01 NOTE — Telephone Encounter (Signed)
Generic Hydromet 120 cc 1 teaspoon every 6-8 hours as needed. Office visit if no better

## 2012-06-02 MED ORDER — HYDROCODONE-HOMATROPINE 5-1.5 MG/5ML PO SYRP: ORAL_SOLUTION | ORAL | Status: DC

## 2012-06-02 NOTE — Telephone Encounter (Signed)
Noted rx'ed phoned in

## 2012-06-02 NOTE — Telephone Encounter (Signed)
Called regarding no Rx received at pharmacy after call 06/01/12 regarding cough.  Per Dr Frederik Pear order 06/01/12, generic Hydromet, Dispense 120 cc; Sig: 1 teaspoon every 6-8 hours as needed, called to Kathyrn Lass at [address] [phone].  Advised office visit if not better.

## 2012-06-18 ENCOUNTER — Other Ambulatory Visit: Payer: Self-pay | Admitting: Dermatology

## 2012-06-18 DIAGNOSIS — C4441 Basal cell carcinoma of skin of scalp and neck: Secondary | ICD-10-CM | POA: Diagnosis not present

## 2012-06-18 DIAGNOSIS — D0430 Carcinoma in situ of skin of unspecified part of face: Secondary | ICD-10-CM | POA: Diagnosis not present

## 2012-06-18 DIAGNOSIS — D0439 Carcinoma in situ of skin of other parts of face: Secondary | ICD-10-CM | POA: Diagnosis not present

## 2012-06-18 DIAGNOSIS — D485 Neoplasm of uncertain behavior of skin: Secondary | ICD-10-CM | POA: Diagnosis not present

## 2012-06-23 ENCOUNTER — Encounter: Payer: Self-pay | Admitting: Internal Medicine

## 2012-06-24 ENCOUNTER — Ambulatory Visit (INDEPENDENT_AMBULATORY_CARE_PROVIDER_SITE_OTHER): Payer: Medicare Other | Admitting: Internal Medicine

## 2012-06-24 ENCOUNTER — Encounter: Payer: Self-pay | Admitting: Internal Medicine

## 2012-06-24 VITALS — BP 132/78 | HR 82 | Temp 98.1°F | Wt 154.0 lb

## 2012-06-24 DIAGNOSIS — J209 Acute bronchitis, unspecified: Secondary | ICD-10-CM | POA: Diagnosis not present

## 2012-06-24 MED ORDER — AZITHROMYCIN 250 MG PO TABS: ORAL_TABLET | ORAL | Status: DC

## 2012-06-24 MED ORDER — FLUTICASONE-SALMETEROL 250-50 MCG/DOSE IN AEPB: 1.0000 | INHALATION_SPRAY | Freq: Two times a day (BID) | RESPIRATORY_TRACT | Status: DC

## 2012-06-24 MED ORDER — PREDNISONE 20 MG PO TABS: 20.0000 mg | ORAL_TABLET | Freq: Two times a day (BID) | ORAL | Status: DC

## 2012-06-24 NOTE — Patient Instructions (Addendum)
Advair one  inhalations every 12 hours; gargle and spit after use

## 2012-06-24 NOTE — Progress Notes (Signed)
  Subjective:    Patient ID: Amanda [name], female    DOB: 11-14-1940, 73 y.o.   MRN: [id number]  HPI Symptoms began approximately one month ago while on a bus trip with travelers who were sick . She developed a cough productive of scant yellow sputum which was difficult to produce. The cough has improved with Dayquil which she takes at bedtime and a prescription cough syrup.  The cough has been associated with some shortness of breath as well as wheezing. The wheezing has improved.  She has never smoked and does not have asthma. She is not on ACE inhibitor.    Review of Systems She had taken ranitidine in the past but denies any significant reflux symptoms. She specifically denies dyspepsia or dysphagia  She has no significant extrinsic symptoms of watery, itchy eyes, sneezing.  She denies signs and symptoms of rhinosinusitis such as frontal headache, facial pain, nasal purulence, dental pain, sore throat, otic pain, or otic discharge.     Objective:   Physical Exam General appearance:good health ;well nourished; no acute distress or increased work of breathing is present.  No  lymphadenopathy about the head, neck, or axilla noted. Appears younger than stated age  Eyes: No conjunctival inflammation or lid edema is present.  Ears:  External ear exam shows no significant lesions or deformities.  Otoscopic examination reveals clear canals, tympanic membranes are intact bilaterally without bulging, retraction, inflammation or discharge. There is minor scarring of the tympanic membranes, right greater than left.  Nose:  External nasal examination shows no deformity or inflammation. Nasal mucosa are pink and moist without lesions or exudates. No septal dislocation or deviation.No obstruction to airflow.   Oral exam: Dental hygiene is good; lips and gums are healthy appearing.There is no oropharyngeal erythema or exudate noted.   Neck:  No deformities,  masses, or tenderness noted.      Heart:  Normal rate and regular rhythm. S1 and S2 normal without gallop, murmur, click, rub. S4 with slurring suggested versus grade 1/2 systolic murmur. Lungs: Paroxysmal slightly loose cough. Diffuse low-grade inspiratory pops and wheezes.  Extremities: No cyanosis, edema,or clubbing  noted    Skin: Warm & dry         Assessment & Plan:   #1 protracted bronchitis with reactive airway component; no past history of asthma  Plan: See orders and recommendations.

## 2012-07-01 ENCOUNTER — Inpatient Hospital Stay (HOSPITAL_BASED_OUTPATIENT_CLINIC_OR_DEPARTMENT_OTHER)
Admission: EM | Admit: 2012-07-01 | Discharge: 2012-07-04 | DRG: 193 | Disposition: A | Discharge status: Home / Self Care | Payer: Medicare Other | Attending: Family Medicine | Admitting: Family Medicine

## 2012-07-01 ENCOUNTER — Emergency Department (HOSPITAL_BASED_OUTPATIENT_CLINIC_OR_DEPARTMENT_OTHER): Payer: Medicare Other

## 2012-07-01 ENCOUNTER — Encounter (HOSPITAL_BASED_OUTPATIENT_CLINIC_OR_DEPARTMENT_OTHER): Payer: Self-pay | Admitting: *Deleted

## 2012-07-01 DIAGNOSIS — R059 Cough, unspecified: Secondary | ICD-10-CM | POA: Diagnosis not present

## 2012-07-01 DIAGNOSIS — Z79899 Other long term (current) drug therapy: Secondary | ICD-10-CM

## 2012-07-01 DIAGNOSIS — R51 Headache: Secondary | ICD-10-CM | POA: Diagnosis not present

## 2012-07-01 DIAGNOSIS — I6350 Cerebral infarction due to unspecified occlusion or stenosis of unspecified cerebral artery: Secondary | ICD-10-CM | POA: Diagnosis present

## 2012-07-01 DIAGNOSIS — J189 Pneumonia, unspecified organism: Secondary | ICD-10-CM

## 2012-07-01 DIAGNOSIS — R079 Chest pain, unspecified: Secondary | ICD-10-CM | POA: Diagnosis not present

## 2012-07-01 DIAGNOSIS — R930 Abnormal findings on diagnostic imaging of skull and head, not elsewhere classified: Secondary | ICD-10-CM | POA: Diagnosis present

## 2012-07-01 DIAGNOSIS — G311 Senile degeneration of brain, not elsewhere classified: Secondary | ICD-10-CM | POA: Diagnosis not present

## 2012-07-01 DIAGNOSIS — IMO0002 Reserved for concepts with insufficient information to code with codable children: Secondary | ICD-10-CM | POA: Diagnosis not present

## 2012-07-01 DIAGNOSIS — R42 Dizziness and giddiness: Secondary | ICD-10-CM | POA: Diagnosis not present

## 2012-07-01 DIAGNOSIS — E785 Hyperlipidemia, unspecified: Secondary | ICD-10-CM | POA: Diagnosis present

## 2012-07-01 DIAGNOSIS — K219 Gastro-esophageal reflux disease without esophagitis: Secondary | ICD-10-CM | POA: Diagnosis present

## 2012-07-01 DIAGNOSIS — R05 Cough: Secondary | ICD-10-CM | POA: Diagnosis not present

## 2012-07-01 DIAGNOSIS — E039 Hypothyroidism, unspecified: Secondary | ICD-10-CM | POA: Diagnosis not present

## 2012-07-01 DIAGNOSIS — T7840XA Allergy, unspecified, initial encounter: Secondary | ICD-10-CM | POA: Diagnosis present

## 2012-07-01 LAB — URINALYSIS, ROUTINE W REFLEX MICROSCOPIC
Bilirubin Urine: NEGATIVE
Glucose, UA: NEGATIVE mg/dL
Hgb urine dipstick: NEGATIVE
Ketones, ur: NEGATIVE mg/dL
Protein, ur: NEGATIVE mg/dL
pH: 5.5 (ref 5.0–8.0)

## 2012-07-01 LAB — COMPREHENSIVE METABOLIC PANEL
AST: 36 U/L (ref 0–37)
BUN: 24 mg/dL — ABNORMAL HIGH (ref 6–23)
CO2: 27 mEq/L (ref 19–32)
Chloride: 100 mEq/L (ref 96–112)
Creatinine, Ser: 0.8 mg/dL (ref 0.50–1.10)
GFR calc Af Amer: 83 mL/min — ABNORMAL LOW (ref >90)
GFR calc non Af Amer: 71 mL/min — ABNORMAL LOW (ref >90)
Glucose, Bld: 98 mg/dL (ref 70–99)
Total Bilirubin: 0.4 mg/dL (ref 0.3–1.2)

## 2012-07-01 LAB — CBC WITH DIFFERENTIAL/PLATELET
Eosinophils Absolute: 0.1 10*3/uL (ref 0.0–0.7)
Eosinophils Relative: 2 % (ref 0–5)
HCT: 41.8 % (ref 36.0–46.0)
Lymphocytes Relative: 23 % (ref 12–46)
Lymphs Abs: 1.8 10*3/uL (ref 0.7–4.0)
MCH: 27.8 pg (ref 26.0–34.0)
MCV: 80.7 fL (ref 78.0–100.0)
Monocytes Absolute: 0.8 10*3/uL (ref 0.1–1.0)
Monocytes Relative: 11 % (ref 3–12)
RBC: 5.18 MIL/uL — ABNORMAL HIGH (ref 3.87–5.11)
WBC: 7.7 10*3/uL (ref 4.0–10.5)

## 2012-07-01 LAB — URINE MICROSCOPIC-ADD ON

## 2012-07-01 MED ORDER — DEXTROSE 5 % IV SOLN
1.0000 g | Freq: Once | INTRAVENOUS | Status: AC
  Administered 2012-07-01: 1 g via INTRAVENOUS
  Filled 2012-07-01: qty 10

## 2012-07-01 NOTE — ED Notes (Signed)
MD at bedside. 

## 2012-07-01 NOTE — ED Provider Notes (Signed)
History     CSN: [id number]  Arrival date & time 07/01/12  2031   First MD Initiated Contact with Patient 07/01/12 2103      Chief Complaint  Patient presents with  . Chest Pain    (Consider location/radiation/quality/duration/timing/severity/associated sxs/prior treatment) HPI Comments: Patient is a 72 year old woman who had been seen by her physician, Marga Melnick M.D., for a cough and was placed on azithromycin and prednisone. She is just about finished those medications. For the past 2 days she has felt unbalanced and weak. This afternoon she had a pain in both sides of her chest it seemed to go into her right ear. She had some coughing associated with this. She describes the pain as like a pinching feeling in her chest.  Patient is a 72 y.o. female presenting with chest pain. The history is provided by medical records, the patient and a significant other. A language interpreter was used.  Chest Pain Chest pain location: Pain is felt on both sides the chest, radiates to the right neck and ear.. Pain quality comment:  Chest pain is a pinching feeling in her chest. Pain radiates to:  Neck Pain radiates to the back: no   Pain severity:  Moderate (She rated her pain at a 7 initially.) Onset quality:  Gradual Duration:  6 hours Timing:  Intermittent Progression:  Waxing and waning Chronicity:  New Context: breathing   Relieved by:  Nothing Worsened by:  Nothing tried Ineffective treatments: Tried prior treatment for bronchitis with azithromycin and prednisone. Associated symptoms: dizziness   Associated symptoms: no fever and no palpitations     Past Medical History  Diagnosis Date  . Hypothyroidism   . Hyperlipidemia   . Constipation, chronic   . PONV (postoperative nausea and vomiting)     needs little anesthesia  . GERD (gastroesophageal reflux disease)     zantac  . History of blood transfusion 1959    [place]  . Shortness of breath     on exertion  . SUI  (stress urinary incontinence, female)   . Shingles     Past Surgical History  Procedure Laterality Date  . Tonsillectomy and adenoidectomy    . Cesarean section  1959  . Wisdom tooth extraction    . Cataract extraction  2009, 2011    BOTH EYES  . Tubal ligation      BY LAPAROSCOPY  . Dilation and curettage of uterus      X2  . Carpal tunnel release  1999  . Colonoscopy       Dr Matthias Hughs  . Hysteroscopy w/d&c  01/07/2012    Procedure: DILATATION AND CURETTAGE /HYSTEROSCOPY;  Surgeon: Ok Edwards, MD;  Location: [facility];  Service: Gynecology;  Laterality: N/A;  intrauterine foley catheter for tamponode     Family History  Problem Relation Age of Onset  . Ovarian cancer Mother   . Breast cancer Mother   . Cancer Mother     OVARIAN  . Hypertension Father   . Prostate cancer Father   . Kidney failure Father   . Hyperlipidemia Brother     History  Substance Use Topics  . Smoking status: Never Smoker   . Smokeless tobacco: Never Used  . Alcohol Use: Yes     Comment: Red wine occasionally    OB History   Grav Para Term Preterm Abortions TAB SAB Ect Mult Living   4 4  3       3  Review of Systems  Constitutional: Negative.  Negative for fever and chills.  HENT: Negative.   Eyes: Negative.   Respiratory: Negative.   Cardiovascular: Positive for chest pain. Negative for palpitations and leg swelling.  Gastrointestinal: Negative.   Endocrine:       Known hypothyroidism.  Genitourinary: Negative.   Musculoskeletal: Negative.   Skin: Negative.   Neurological: Positive for dizziness.  Psychiatric/Behavioral: Negative.     Allergies  Levofloxacin and Pravastatin sodium  Home Medications   Current Outpatient Rx  Name  Route  Sig  Dispense  Refill  . acetaminophen (TYLENOL) 650 MG CR tablet   Oral   Take 650 mg by mouth daily.         [name] azithromycin (ZITHROMAX Z-PAK) 250 MG tablet      2 day 1, then 1 qd   6 each   0   . Fluticasone-Salmeterol  (ADVAIR DISKUS) 250-50 MCG/DOSE AEPB   Inhalation   Inhale 1 puff into the lungs 2 (two) times daily.   14 each   0   . Glucosamine-Chondroitin (GLUCOSAMINE CHONDR COMPLEX PO)   Oral   Take 1 tablet by mouth daily.          [name] HYDROcodone-homatropine (HYDROMET) 5-1.5 MG/5ML syrup      Take by mouth every 6-8 hours as needed for cough.   120 mL   0   . levothyroxine (SYNTHROID, LEVOTHROID) 100 MCG tablet   Oral   Take 100-150 mcg by mouth daily. Takes 1 tablet every day except on Wednesday's takes 1.5 tablets         . OVER THE COUNTER MEDICATION   Oral   Take 1 tablet by mouth daily. Vitamin that she gets from eye doctor for macular degeneration.         . predniSONE (DELTASONE) 20 MG tablet   Oral   Take 1 tablet (20 mg total) by mouth 2 (two) times daily.   14 tablet   0     BP 159/77  Pulse 61  Temp(Src) 98.8 F (37.1 C) (Oral)  Resp 16  Ht 5\' 2"  (1.575 m)  Wt 154 lb (69.854 kg)  BMI 28.16 kg/m2  SpO2 98%  Physical Exam  Nursing note and vitals reviewed. Constitutional: She is oriented to person, place, and time. She appears well-developed and well-nourished. No distress.  In mild distress with pain in her chest.  HENT:  Head: Normocephalic and atraumatic.  Right Ear: External ear normal.  Left Ear: External ear normal.  Mouth/Throat: Oropharynx is clear and moist.  Eyes: Conjunctivae and EOM are normal. Pupils are equal, round, and reactive to light. No scleral icterus.  Neck: Normal range of motion. Neck supple.  Cardiovascular: Normal rate, regular rhythm and normal heart sounds.   Pulmonary/Chest: Effort normal and breath sounds normal.  Abdominal: Soft. Bowel sounds are normal.  Musculoskeletal: Normal range of motion. She exhibits no edema and no tenderness.  Neurological: She is alert and oriented to person, place, and time.  No sensory or motor deficit.  Skin: Skin is warm and dry.  Psychiatric: She has a normal mood and affect. Her behavior  is normal.    ED Course  Procedures (including critical care time)  11:46 PM Results for orders placed during the hospital encounter of 07/01/12  CBC WITH DIFFERENTIAL      Result Value Range   WBC 7.7  4.0 - 10.5 K/uL   RBC 5.18 (*) 3.87 - 5.11 MIL/uL   Hemoglobin  14.4  12.0 - 15.0 g/dL   HCT 56.2  13.0 - 86.5 %   MCV 80.7  78.0 - 100.0 fL   MCH 27.8  26.0 - 34.0 pg   MCHC 34.4  30.0 - 36.0 g/dL   RDW 78.4  69.6 - 29.5 %   Platelets 219  150 - 400 K/uL   Neutrophils Relative 64  43 - 77 %   Neutro Abs 5.0  1.7 - 7.7 K/uL   Lymphocytes Relative 23  12 - 46 %   Lymphs Abs 1.8  0.7 - 4.0 K/uL   Monocytes Relative 11  3 - 12 %   Monocytes Absolute 0.8  0.1 - 1.0 K/uL   Eosinophils Relative 2  0 - 5 %   Eosinophils Absolute 0.1  0.0 - 0.7 K/uL   Basophils Relative 0  0 - 1 %   Basophils Absolute 0.0  0.0 - 0.1 K/uL  URINALYSIS, ROUTINE W REFLEX MICROSCOPIC      Result Value Range   Color, Urine YELLOW  YELLOW   APPearance CLEAR  CLEAR   Specific Gravity, Urine 1.016  1.005 - 1.030   pH 5.5  5.0 - 8.0   Glucose, UA NEGATIVE  NEGATIVE mg/dL   Hgb urine dipstick NEGATIVE  NEGATIVE   Bilirubin Urine NEGATIVE  NEGATIVE   Ketones, ur NEGATIVE  NEGATIVE mg/dL   Protein, ur NEGATIVE  NEGATIVE mg/dL   Urobilinogen, UA 0.2  0.0 - 1.0 mg/dL   Nitrite NEGATIVE  NEGATIVE   Leukocytes, UA SMALL (*) NEGATIVE  COMPREHENSIVE METABOLIC PANEL      Result Value Range   Sodium 140  135 - 145 mEq/L   Potassium 4.8  3.5 - 5.1 mEq/L   Chloride 100  96 - 112 mEq/L   CO2 27  19 - 32 mEq/L   Glucose, Bld 98  70 - 99 mg/dL   BUN 24 (*) 6 - 23 mg/dL   Creatinine, Ser 2.84  0.50 - 1.10 mg/dL   Calcium 9.5  8.4 - 13.2 mg/dL   Total Protein 6.8  6.0 - 8.3 g/dL   Albumin 3.8  3.5 - 5.2 g/dL   AST 36  0 - 37 U/L   ALT 21  0 - 35 U/L   Alkaline Phosphatase 74  39 - 117 U/L   Total Bilirubin 0.4  0.3 - 1.2 mg/dL   GFR calc non Af Amer 71 (*) >90 mL/min   GFR calc Af Amer 83 (*) >90 mL/min   TROPONIN I      Result Value Range   Troponin I <0.30  <0.30 ng/mL  URINE MICROSCOPIC-ADD ON      Result Value Range   Squamous Epithelial / LPF RARE  RARE   WBC, UA 3-6  <3 WBC/hpf   Bacteria, UA RARE  RARE   Dg Chest 2 View  07/01/2012  *RADIOLOGY REPORT*  Clinical Data: Right-sided chest pain.  Nausea and dizziness for 3 days.  Recent diagnosis of bronchitis.  CHEST - 2 VIEW  Comparison: 11/17/2010  Findings: Shallow inspiration.  Borderline heart size with normal pulmonary vascularity.  Infiltration in both lung bases may represent edema or pneumonia.  No blunting of costophrenic angles. No pneumothorax.  Mediastinal contours appear intact.  Degenerative changes in the thoracic spine.  IMPRESSION: Airspace infiltration in the lung bases may represent edema or pneumonia.   Original Report Authenticated By: Burman Nieves, M.D.    Ct Head Wo Contrast  07/01/2012  *RADIOLOGY  REPORT*  Clinical Data: Headache.  Chest pain.  Dizziness and weakness.  CT HEAD WITHOUT CONTRAST  Technique:  Contiguous axial images were obtained from the base of the skull through the vertex without contrast.  Comparison: None.  Findings: Patchy low attenuation change throughout the deep white matter consistent with small vessel ischemia.  Focal low attenuation in the left parietal region extends to the cortex suggesting age indeterminate infarct. No mass effect or midline shift.  No ventricular dilatation.  No abnormal extra-axial fluid collections.  Gray-white matter junctions are distinct.  Basal cisterns are not effaced.  No evidence of acute intracranial hemorrhage.  Vascular calcifications.  No depressed skull fractures.  Visualized paranasal sinuses and mastoid air cells are not opacified.  IMPRESSION: Diffuse white matter disease likely representing small vessel ischemia.  Focal low attenuation in the left parietal region extensive cortex suggesting age indeterminate infarct. No acute intracranial hemorrhage or mass  effect.   Original Report Authenticated By: Burman Nieves, M.D.     Lab workup revealed infiltrates at both lung bases, more on the left, leading to diagnosis of CAP (community acquired pneumonia).  CT of head shows an area of low attenuation suggesting age indeterminate stroke.  Case discussed with Dr. Toniann Fail of [facility].  He accepts pt for transfer to [facility] to a telemetry bed.  Orders for treatment with Rocephin and azithromycin placed.    1. CAP (community acquired pneumonia)               Carleene Cooper III, MD 07/01/12 [phone]

## 2012-07-01 NOTE — ED Notes (Signed)
MD in to speak with patient and family about admission.

## 2012-07-01 NOTE — ED Notes (Signed)
Pt c/o right side chest pain with nausea and dizziness x 3 days , dx last week with bronchitis placed on abx and prednisone

## 2012-07-01 NOTE — ED Notes (Signed)
Patient transported to CT/Xray. 

## 2012-07-02 ENCOUNTER — Inpatient Hospital Stay (HOSPITAL_COMMUNITY): Payer: Medicare Other

## 2012-07-02 DIAGNOSIS — J189 Pneumonia, unspecified organism: Principal | ICD-10-CM

## 2012-07-02 DIAGNOSIS — R930 Abnormal findings on diagnostic imaging of skull and head, not elsewhere classified: Secondary | ICD-10-CM

## 2012-07-02 DIAGNOSIS — R079 Chest pain, unspecified: Secondary | ICD-10-CM | POA: Diagnosis present

## 2012-07-02 LAB — CBC
HCT: 38 % (ref 36.0–46.0)
Hemoglobin: 13.3 g/dL (ref 12.0–15.0)
MCH: 27.5 pg (ref 26.0–34.0)
MCHC: 35 g/dL (ref 30.0–36.0)
RDW: 14.6 % (ref 11.5–15.5)

## 2012-07-02 LAB — BASIC METABOLIC PANEL
BUN: 20 mg/dL (ref 6–23)
Calcium: 8.8 mg/dL (ref 8.4–10.5)
GFR calc Af Amer: 82 mL/min — ABNORMAL LOW (ref >90)
GFR calc non Af Amer: 70 mL/min — ABNORMAL LOW (ref >90)
Potassium: 3.9 mEq/L (ref 3.5–5.1)

## 2012-07-02 LAB — TROPONIN I: Troponin I: <0.3 ng/mL (ref <0.30)

## 2012-07-02 LAB — STREP PNEUMONIAE URINARY ANTIGEN: Strep Pneumo Urinary Antigen: NEGATIVE

## 2012-07-02 LAB — HIV ANTIBODY (ROUTINE TESTING W REFLEX): HIV: NONREACTIVE

## 2012-07-02 MED ORDER — LEVOTHYROXINE SODIUM 100 MCG PO TABS
100.0000 ug | ORAL_TABLET | ORAL | Status: DC
  Administered 2012-07-02 – 2012-07-04 (×3): 100 ug via ORAL
  Filled 2012-07-02 (×4): qty 1

## 2012-07-02 MED ORDER — ASPIRIN 325 MG PO TABS
325.0000 mg | ORAL_TABLET | Freq: Every day | ORAL | Status: DC
  Administered 2012-07-02 – 2012-07-04 (×3): 325 mg via ORAL
  Filled 2012-07-02 (×3): qty 1

## 2012-07-02 MED ORDER — LEVOTHYROXINE SODIUM 150 MCG PO TABS: 150.0000 ug | ORAL_TABLET | ORAL | Status: DC

## 2012-07-02 MED ORDER — SODIUM CHLORIDE 0.9 % IV SOLN: INTRAVENOUS | Status: AC

## 2012-07-02 MED ORDER — PREDNISONE 20 MG PO TABS: 20.0000 mg | ORAL_TABLET | Freq: Every day | ORAL | Status: DC

## 2012-07-02 MED ORDER — DEXTROSE 5 % IV SOLN
1.0000 g | INTRAVENOUS | Status: DC
  Administered 2012-07-03: 1 g via INTRAVENOUS
  Filled 2012-07-02: qty 10

## 2012-07-02 MED ORDER — AZITHROMYCIN 500 MG IV SOLR
500.0000 mg | Freq: Once | INTRAVENOUS | Status: AC
  Administered 2012-07-02: 500 mg via INTRAVENOUS
  Filled 2012-07-02 (×2): qty 500

## 2012-07-02 MED ORDER — ACETAMINOPHEN 325 MG PO TABS: 650.0000 mg | ORAL_TABLET | ORAL | Status: DC | PRN

## 2012-07-02 MED ORDER — AZITHROMYCIN 500 MG PO TABS
500.0000 mg | ORAL_TABLET | ORAL | Status: DC
  Administered 2012-07-03 (×2): 500 mg via ORAL
  Filled 2012-07-02 (×3): qty 1

## 2012-07-02 MED ORDER — HEPARIN SODIUM (PORCINE) 5000 UNIT/ML IJ SOLN
5000.0000 [IU] | Freq: Three times a day (TID) | INTRAMUSCULAR | Status: DC
  Administered 2012-07-02 – 2012-07-03 (×5): 5000 [IU] via SUBCUTANEOUS
  Filled 2012-07-02 (×10): qty 1

## 2012-07-02 MED ORDER — HYDROCODONE-HOMATROPINE 5-1.5 MG/5ML PO SYRP
5.0000 mL | ORAL_SOLUTION | Freq: Four times a day (QID) | ORAL | Status: DC | PRN
  Administered 2012-07-02: 5 mL via ORAL
  Filled 2012-07-02: qty 5

## 2012-07-02 MED ORDER — LEVOTHYROXINE SODIUM 100 MCG PO TABS: 100.0000 ug | ORAL_TABLET | Freq: Every day | ORAL | Status: DC

## 2012-07-02 MED ORDER — MOMETASONE FURO-FORMOTEROL FUM 100-5 MCG/ACT IN AERO
2.0000 | INHALATION_SPRAY | Freq: Two times a day (BID) | RESPIRATORY_TRACT | Status: DC
  Administered 2012-07-02 – 2012-07-03 (×4): 2 via RESPIRATORY_TRACT
  Filled 2012-07-02: qty 8.8

## 2012-07-02 MED ORDER — LEVOTHYROXINE SODIUM 100 MCG IV SOLR: 100.0000 ug | Freq: Every day | INTRAVENOUS | Status: DC

## 2012-07-02 NOTE — Progress Notes (Signed)
PROGRESS NOTE  Amanda [name] UJW:[id number] DOB: [birth year] DOA: 07/01/2012 PCP: Marga Melnick, MD  Brief narrative: 72 yr old female admitted 07/02/12 with Partially Rx CAP-Because of feeling weak and unbalancced, also had a Ct head on admission showing an age-in determinant stroke.  Past medical history-As per Problem list Chart reviewed as below- Nuclear stress done 12/17/10 was probaby normal with small non-reversible apical defect suggestive of sfot tissue attenuation  Consultants:  None currently  Procedures:  CT head 4/30  CXr 4/30  MRI brain 5/1  Antibiotics:  Azithromycin 5/1  Cefepime 5/1   Subjective  Doing well. Still feels occasionally dizzy. Occasionally has pain in the chest but seems to better change in position/coughing No blurred vision or double vision no sputum when she walks feels dizzy to the right. On peripheral testing of her visual fields, she states that she does have some recommencement of her   Objective    Interim History: NAD  Telemetry: And bradycardic to the 40s  Objective: Filed Vitals:   07/01/12 2244 07/01/12 2337 07/02/12 0030 07/02/12 0554  BP: 154/65 159/77 172/67 132/68  Pulse: 61  59 54  Temp: 98.9 F (37.2 C) 98.8 F (37.1 C) 99.5 F (37.5 C) 98.6 F (37 C)  TempSrc: Oral  Oral Oral  Resp: 16 16    Height:    5\' 2"  (1.575 m)  Weight:    69.854 kg (154 lb)  SpO2: 95% 98% 97% 96%   No intake or output data in the 24 hours ending 07/02/12 1103  Exam:  General: A pleasant oriented Cardiovascular: S1-S2 no murmur rub or Respiratory: Clinically clear no added sound no tactile vocal resonance or Abdomen: Soft nontender nondistended Skin no lower extremity Neuro grossly intact no focal deficit moves all 4 limbs equally finger-nose-finger test is normal vision by direct confrontation is normal power 5/5 she does feel somewhat vertiginous however  Data Reviewed: Basic Metabolic Panel:  Recent Labs Lab 07/01/12 2138  07/02/12 0200  NA 140 140  K 4.8 3.9  CL 100 103  CO2 27 26  GLUCOSE 98 91  BUN 24* 20  CREATININE 0.80 0.81  CALCIUM 9.5 8.8   Liver Function Tests:  Recent Labs Lab 07/01/12 2138  AST 36  ALT 21  ALKPHOS 74  BILITOT 0.4  PROT 6.8  ALBUMIN 3.8   No results found for this basename: LIPASE, AMYLASE,  in the last 168 hours No results found for this basename: AMMONIA,  in the last 168 hours CBC:  Recent Labs Lab 07/01/12 2138 07/02/12 0200  WBC 7.7 6.3  NEUTROABS 5.0  --   HGB 14.4 13.3  HCT 41.8 38.0  MCV 80.7 78.7  PLT 219 221   Cardiac Enzymes:  Recent Labs Lab 07/01/12 2138 07/02/12 0200 07/02/12 0810  TROPONINI <0.30 <0.30 <0.30   BNP: No components found with this basename: POCBNP,  CBG: No results found for this basename: GLUCAP,  in the last 168 hours  No results found for this or any previous visit (from the past 240 hour(s)).   Studies:              All Imaging reviewed and is as per above notation   Scheduled Meds: . sodium chloride   Intravenous STAT  . [START ON 07/03/2012] azithromycin  500 mg Oral Q24H  . [START ON 07/03/2012] cefTRIAXone (ROCEPHIN)  IV  1 g Intravenous Q24H  . heparin  5,000 Units Subcutaneous Q8H  . levothyroxine  100 mcg  Oral Custom  . [START ON 07/08/2012] levothyroxine  150 mcg Oral Custom  . mometasone-formoterol  2 puff Inhalation BID   Continuous Infusions:    Assessment/Plan: 1. Failed community acquired pneumonia treatment-continue azithromycin cefepime IV-potentially transition to oral equivalent of Augmentin for about 5 day subsequent to this on 05/02/2014am. Repeat CBC in am 2. ? Vertigo versus subacute CVA and MRA of the brain is negative he I will await PTOT evaluation prior to ordering further diagnostics including carotid Doppler and echo. She will potentially be able to get these done as an outpatient-will obtain lipid panel and A1c's as well-she has an intolerance to statins  3. Hypothyroidism-continue  levothyroxine 100 mcg daily 4. Allergies-continue mometasone/formoterol 2 puff twice a day  Code Status: FUll Family Communication: Discussed with Step-daughter [nurse at wom[facility]] and patient  Disposition Plan: inpatient   Pleas Koch, MD  [facility] Pager [phone] 07/02/2012, 11:03 AM    LOS: 1 day

## 2012-07-02 NOTE — Evaluation (Addendum)
Physical Therapy Evaluation Patient Details Name: Amanda [name] MRN: [id number] DOB: Jul 23, 1940 Today's Date: 07/02/2012 Time: 4098-1191 PT Time Calculation (min): 33 min  PT Assessment / Plan / Recommendation Clinical Impression  Patient is a 72 y/o female admitted with pneumonia and general weakness and instability.  Presents with some signs of left vestibular hypofunction, though symptoms seem more indicative of medication reaction or simply exacerbation of hypofunction in light of acute illness.  Noted no focal weakness or sensory changes or coordination deficits that could indicate this is acute CVA.  Patient does have positve sign with VOR cancellation, but do not feel is warranted for further stroke work up.  No further skilled PT indicated at this time.  Did encourage pt to seek PCP for outpatient PT referral for vestibular rehab if symptoms recur or worsen.  Also educated pt to seek medical attention if dizziness occurs differently than normal vertigo as can be symptom of stroke and reviewed warning signs.    PT Assessment  Patent does not need any further PT services    Follow Up Recommendations  No PT follow up          Equipment Recommendations  None recommended by PT          Precautions / Restrictions Precautions Precautions: None   Pertinent Vitals/Pain No pain currently      Mobility  Bed Mobility Bed Mobility: Supine to Sit Supine to Sit: 7: Independent Transfers Transfers: Stand to Sit;Sit to Stand Sit to Stand: 7: Independent Stand to Sit: 7: Independent Ambulation/Gait Ambulation/Gait Assistance: 7: Independent Ambulation Distance (Feet): 250 Feet Assistive device: None Gait Pattern: Within Functional Limits Stairs: Yes Stairs Assistance: 6: Modified independent (Device/Increase time) Stair Management Technique: One rail Right;Alternating pattern;Forwards Number of Stairs: 4    Vestibular exam  Other Exercises Other Exercises: Seated oculomotor  exam: saccades and smooth pursuits WNL without noted gaze holding nystagmus.  Seated VOR (vestibular ocular reflex) hoizontal and vertical 30 seconds without symptoms or noted difficulty maintaining target.  Head thrust test positive for refixation to left.  VOR cancellation positive for refixation/nystagmus with left head movment.   Other Exercises: Positional exam: deferred due to negative for c/o spinning.      Visit Information  Last PT Received On: 07/02/12 Assistance Needed: +1    Subjective Data  Subjective: Patient reports two previous episodes of vertigo with spinning and nausea about one day.  Reports still struggles with nausea.  No changes in hearing and no falls.  States recent episode walking in [place] Sunday and walking dog noted some imbalance and slower pace and pulling sensation to right.  States no current symptoms.  Started Z-pak and steroids for bronchitis last week and felt better quickly, but had recurrence of symptoms including weakness and off balance feeling, so admitted for further work up.   Patient Stated Goal: None stated   Prior Functioning  Home Living Lives With: Spouse Available Help at Discharge: Family;Available 24 hours/day Type of Home: House Home Access: Stairs to enter [employer] of Steps: 3-4 Entrance Stairs-Rails: Left;Right Home Layout: One level Bathroom Shower/Tub: Tub/shower unit;Door;Curtain (one of each in different bathrooms) Bathroom Accessibility: Yes Home Adaptive Equipment: None Prior Function Level of Independence: Independent Able to Take Stairs?: Yes Driving: Yes Vocation: Retired Musician: No difficulties Dominant Hand: Right    Cognition  Cognition Arousal/Alertness: Awake/alert Behavior During Therapy: WFL for tasks assessed/performed Overall Cognitive Status: Within Functional Limits for tasks assessed    Extremity/Trunk Assessment Right Upper  Extremity Assessment RUE Sensation: WFL -  Light Touch RUE Coordination: WFL - gross/fine motor Left Upper Extremity Assessment LUE Sensation: WFL - Light Touch LUE Coordination: WFL - gross/fine motor Right Lower Extremity Assessment RLE ROM/Strength/Tone: WFL for tasks assessed RLE Sensation: WFL - Light Touch RLE Coordination: WFL - gross/fine motor Left Lower Extremity Assessment LLE ROM/Strength/Tone: WFL for tasks assessed LLE Sensation: WFL - Light Touch LLE Coordination: WFL - gross/fine motor Trunk Assessment Trunk Assessment: Normal   Balance Standardized Balance Assessment Standardized Balance Assessment: Dynamic Gait Index Dynamic Gait Index Level Surface: Normal Change in Gait Speed: Normal Gait with Horizontal Head Turns: Moderate Impairment (veers tor rt. with rt. head turn and c/o imbalance head lt.) Gait with Vertical Head Turns: Normal Gait and Pivot Turn: Normal Step Over Obstacle: Normal Step Around Obstacles: Normal Steps: Mild Impairment Total Score: 21/24 (scores 19 or less indicative of fall risk in older community living adults)  End of Session PT - End of Session Activity Tolerance: Patient tolerated treatment well Patient left: in bed;with call bell/phone within reach;with family/visitor present  GP     [facility] 07/02/2012, 4:53 PM Sheran Lawless, PT [phone] 07/02/2012

## 2012-07-02 NOTE — Progress Notes (Signed)
Utilization review completed.  

## 2012-07-02 NOTE — H&P (Addendum)
[facility] History and Physical  Amanda [name] ZOX:[id number] DOB: Jun 21, 1940 DOA: 07/01/2012  Referring physician: ED PCP: Marga Melnick, MD  Specialists: None  Chief Complaint: Cough, chest pain  HPI: Amanda [name] is a 72 y.o. female who has recently been seen by her PCP for cough, and was placed on azithromycin and prednisone.  She has just about finished those medications but began to feel more weak and unbalanced for the past 2 days.  This afternoon she had chest pain, located on both sides of her chest with radiation to her R ear.  She has had some coughing associated with this.  Pain is of quality "pinching feeling" in her chest.  Chest pain is episodic, with episodes lasting under a min in duration before spontaneously resolving.  In the ED CXR suggested the presence of CAP, troponin was negative x1, her CT head showed an age indeterminate infarct in her L paretial region but she is showing no focal neurologic deficits, she was put on rocephin and azithromycin and transferred to [facility] for admission.  Review of Systems: No fever, chills, cough is productive, pain does not seem to be worse with coughing, she notes that her "crackly" breath sounds have actually been improving since she started treatment with azithromycin and prednisone 12 systems reviewed and otherwise negative.  Past Medical History  Diagnosis Date  . Hypothyroidism   . Hyperlipidemia   . Constipation, chronic   . PONV (postoperative nausea and vomiting)     needs little anesthesia  . GERD (gastroesophageal reflux disease)     zantac  . History of blood transfusion 1959    [place]  . Shortness of breath     on exertion  . SUI (stress urinary incontinence, female)   . Shingles    Past Surgical History  Procedure Laterality Date  . Tonsillectomy and adenoidectomy    . Cesarean section  1959  . Wisdom tooth extraction    . Cataract extraction  2009, 2011    BOTH EYES  . Tubal ligation      BY  LAPAROSCOPY  . Dilation and curettage of uterus      X2  . Carpal tunnel release  1999  . Colonoscopy       Dr Matthias Hughs  . Hysteroscopy w/d&c  01/07/2012    Procedure: DILATATION AND CURETTAGE /HYSTEROSCOPY;  Surgeon: Ok Edwards, MD;  Location: [facility];  Service: Gynecology;  Laterality: N/A;  intrauterine foley catheter for tamponode    Social History:  reports that she has never smoked. She has never used smokeless tobacco. She reports that  drinks alcohol. She reports that she does not use illicit drugs.   Allergies  Allergen Reactions  . Levofloxacin Nausea And Vomiting  . Pravastatin Sodium Other (See Comments)    REACTION: Sick    Family History  Problem Relation Age of Onset  . Ovarian cancer Mother   . Breast cancer Mother   . Cancer Mother     OVARIAN  . Hypertension Father   . Prostate cancer Father   . Kidney failure Father   . Hyperlipidemia Brother      Prior to Admission medications   Medication Sig Start Date End Date Taking? Authorizing Provider  acetaminophen (TYLENOL) 650 MG CR tablet Take 650 mg by mouth daily.    Historical Provider, MD  azithromycin (ZITHROMAX Z-PAK) 250 MG tablet 2 day 1, then 1 qd 06/24/12   Pecola Lawless, MD  Fluticasone-Salmeterol (ADVAIR DISKUS) [phone]  MCG/DOSE AEPB Inhale 1 puff into the lungs 2 (two) times daily. 06/24/12   Pecola Lawless, MD  Glucosamine-Chondroitin (GLUCOSAMINE CHONDR COMPLEX PO) Take 1 tablet by mouth daily.     Historical Provider, MD  HYDROcodone-homatropine (HYDROMET) 5-1.5 MG/5ML syrup Take by mouth every 6-8 hours as needed for cough. 06/02/12   Pecola Lawless, MD  levothyroxine (SYNTHROID, LEVOTHROID) 100 MCG tablet Take 100-150 mcg by mouth daily. Takes 1 tablet every day except on Wednesday's takes 1.5 tablets    Historical Provider, MD  OVER THE COUNTER MEDICATION Take 1 tablet by mouth daily. Vitamin that she gets from eye doctor for macular degeneration.    Historical Provider, MD  predniSONE  (DELTASONE) 20 MG tablet Take 1 tablet (20 mg total) by mouth 2 (two) times daily. 06/24/12   Pecola Lawless, MD   Physical Exam: Daiva Eves:   07/01/12 2038 07/01/12 2244 07/01/12 2337  BP: 172/74 154/65 159/77  Pulse: 62 61   Temp: 98.8 F (37.1 C) 98.9 F (37.2 C) 98.8 F (37.1 C)  TempSrc: Oral Oral   Resp: 16 16 16   Height: 5\' 2"  (1.575 m)    Weight: 69.854 kg (154 lb)    SpO2: 96% 95% 98%     General:  NAD, resting comfortably in bed  Eyes: PEERLA EOMI  ENT: mucous membranes moist  Neck: supple w/o JVD  Cardiovascular: RRR w/o MRG  Respiratory: CTA B  Abdomen: soft, nt, nd, bs+  Skin: no rash nor lesion  Musculoskeletal: MAE, full ROM all 4 extremities  Psychiatric: normal tone and affect  Neurologic: AAOx3, grossly non-focal   Labs on Admission:  Basic Metabolic Panel:  Recent Labs Lab 07/01/12 2138  NA 140  K 4.8  CL 100  CO2 27  GLUCOSE 98  BUN 24*  CREATININE 0.80  CALCIUM 9.5   Liver Function Tests:  Recent Labs Lab 07/01/12 2138  AST 36  ALT 21  ALKPHOS 74  BILITOT 0.4  PROT 6.8  ALBUMIN 3.8   No results found for this basename: LIPASE, AMYLASE,  in the last 168 hours No results found for this basename: AMMONIA,  in the last 168 hours CBC:  Recent Labs Lab 07/01/12 2138  WBC 7.7  NEUTROABS 5.0  HGB 14.4  HCT 41.8  MCV 80.7  PLT 219   Cardiac Enzymes:  Recent Labs Lab 07/01/12 2138  TROPONINI <0.30    BNP (last 3 results) No results found for this basename: PROBNP,  in the last 8760 hours CBG: No results found for this basename: GLUCAP,  in the last 168 hours  Radiological Exams on Admission: Dg Chest 2 View  07/01/2012  *RADIOLOGY REPORT*  Clinical Data: Right-sided chest pain.  Nausea and dizziness for 3 days.  Recent diagnosis of bronchitis.  CHEST - 2 VIEW  Comparison: 11/17/2010  Findings: Shallow inspiration.  Borderline heart size with normal pulmonary vascularity.  Infiltration in both lung bases  may represent edema or pneumonia.  No blunting of costophrenic angles. No pneumothorax.  Mediastinal contours appear intact.  Degenerative changes in the thoracic spine.  IMPRESSION: Airspace infiltration in the lung bases may represent edema or pneumonia.   Original Report Authenticated By: Burman Nieves, M.D.    Ct Head Wo Contrast  07/01/2012  *RADIOLOGY REPORT*  Clinical Data: Headache.  Chest pain.  Dizziness and weakness.  CT HEAD WITHOUT CONTRAST  Technique:  Contiguous axial images were obtained from the base of the skull through the vertex without contrast.  Comparison: None.  Findings: Patchy low attenuation change throughout the deep white matter consistent with small vessel ischemia.  Focal low attenuation in the left parietal region extends to the cortex suggesting age indeterminate infarct. No mass effect or midline shift.  No ventricular dilatation.  No abnormal extra-axial fluid collections.  Gray-white matter junctions are distinct.  Basal cisterns are not effaced.  No evidence of acute intracranial hemorrhage.  Vascular calcifications.  No depressed skull fractures.  Visualized paranasal sinuses and mastoid air cells are not opacified.  IMPRESSION: Diffuse white matter disease likely representing small vessel ischemia.  Focal low attenuation in the left parietal region extensive cortex suggesting age indeterminate infarct. No acute intracranial hemorrhage or mass effect.   Original Report Authenticated By: Burman Nieves, M.D.     EKG: Independently reviewed.  Assessment/Plan Principal Problem:   CAP (community acquired pneumonia) Active Problems:   Chest pain   Abnormal CT scan, head   1. Partially treated CAP - feel that this was actually probably worse before hand and was being sucessfully treated with the azithromycin most likely.  Will keep the patient on cap protocol while here but giving the clinical picture and history, feel that she is likely most of the way over this  illness already.  Will go ahead and stop the patients prednisone at this time which she was due to come off of in another day or so anyhow. 2. Chest pain - ddx includes pleuritic chest pain, serial troponins ordered, tele monitor ordered but cardiac chest pain is very unlikely given the location, and episodic nature with short duration, CAD less likely.  More likely is that the chest pain is secondary to persistant cough which has been going on for the past 1 month the patient states. 3. Abnormal CT head - without focal neurologic findings, ordering MRI brain to further evaluate the area of indeterminate age stroke but not putting patient on full stroke pathway as of yet given the complete lack of neurologic complaints at this time.    Code Status: Full Code (must indicate code status--if unknown or must be presumed, indicate so) Family Communication: Spoke with husband at bedside (indicate person spoken with, if applicable, with phone number if by telephone) Disposition Plan: Admit to inpatient (indicate anticipated LOS)  Time spent: 70 min  GARDNER, JARED M. [facility] Pager [phone]  If 7PM-7AM, please contact night-coverage [email or web address] Password [email or web address] 07/02/2012, 1:23 AM

## 2012-07-03 LAB — LIPID PANEL
Cholesterol: 163 mg/dL (ref 0–200)
HDL: 51 mg/dL (ref >39)
Triglycerides: 106 mg/dL (ref <150)

## 2012-07-03 LAB — BASIC METABOLIC PANEL
GFR calc Af Amer: 77 mL/min — ABNORMAL LOW (ref >90)
GFR calc non Af Amer: 66 mL/min — ABNORMAL LOW (ref >90)
Potassium: 3.9 mEq/L (ref 3.5–5.1)
Sodium: 138 mEq/L (ref 135–145)

## 2012-07-03 LAB — LEGIONELLA ANTIGEN, URINE: Legionella Antigen, Urine: NEGATIVE

## 2012-07-03 LAB — CBC
Hemoglobin: 13.3 g/dL (ref 12.0–15.0)
MCHC: 34 g/dL (ref 30.0–36.0)
Platelets: 204 10*3/uL (ref 150–400)
RDW: 14.6 % (ref 11.5–15.5)

## 2012-07-03 MED ORDER — AMOXICILLIN-POT CLAVULANATE 875-125 MG PO TABS
1.0000 | ORAL_TABLET | Freq: Two times a day (BID) | ORAL | Status: DC
  Administered 2012-07-03: 1 via ORAL
  Filled 2012-07-03 (×5): qty 1

## 2012-07-03 MED ORDER — PROMETHAZINE HCL 25 MG/ML IJ SOLN
12.5000 mg | Freq: Four times a day (QID) | INTRAMUSCULAR | Status: DC | PRN
  Administered 2012-07-03: 12.5 mg via INTRAVENOUS
  Filled 2012-07-03: qty 1

## 2012-07-03 NOTE — Progress Notes (Signed)
PROGRESS NOTE  Amanda [name] UXL:[id number] DOB: 06/02/1940 DOA: 07/01/2012 PCP: Marga Melnick, MD  Brief narrative: 72 yr old female admitted 07/02/12 with Partially Rx CAP-Because of feeling weak and unbalancced, also had a Ct head on admission showing an age-in determinant stroke.  Past medical history-As per Problem list Chart reviewed as below- Nuclear stress done 12/17/10 was probaby normal with small non-reversible apical defect suggestive of sfot tissue attenuation  Consultants:  None currently  Procedures:  CT head 4/30  CXr 4/30  MRI brain 5/1  Antibiotics:  Azithromycin 5/1  Cefepime 5/1-->5/2  Augmentin 5/2   Subjective  Doing well. Had 2 episodes of vomiting which he takes secondary to taking antibiotic Dyspnea seems to resolve Of shortness breath or chest pain.   Objective    Interim History: NAD  Telemetry: bradycardic to the 40s  Objective: Filed Vitals:   07/03/12 0538 07/03/12 0908 07/03/12 1344 07/03/12 1347  BP: 134/85  205/95 165/97  Pulse: 67  58   Temp: 98.1 F (36.7 C)  97.8 F (36.6 C)   TempSrc: Oral  Oral   Resp: 16  18   Height:      Weight:      SpO2: 96% 97% 88%    No intake or output data in the 24 hours ending 07/03/12 1618  Exam:  General: A pleasant oriented Cardiovascular: S1-S2 no murmur rub or Respiratory: Clinically clear no added sound no tactile vocal resonance or Abdomen: Soft nontender nondistended Skin no lower extremity Neuro grossly intact no focal deficit moves all 4 limbs equally finger-nose-finger test is normal vision by direct confrontation is normal power 5/5 she does feel somewhat vertiginous however  Data Reviewed: Basic Metabolic Panel:  Recent Labs Lab 07/01/12 2138 07/02/12 0200 07/03/12 0600  NA 140 140 138  K 4.8 3.9 3.9  CL 100 103 102  CO2 27 26 29   GLUCOSE 98 91 106*  BUN 24* 20 16  CREATININE 0.80 0.81 0.85  CALCIUM 9.5 8.8 8.2*   Liver Function Tests:  Recent Labs Lab  07/01/12 2138  AST 36  ALT 21  ALKPHOS 74  BILITOT 0.4  PROT 6.8  ALBUMIN 3.8   No results found for this basename: LIPASE, AMYLASE,  in the last 168 hours No results found for this basename: AMMONIA,  in the last 168 hours CBC:  Recent Labs Lab 07/01/12 2138 07/02/12 0200 07/03/12 0600  WBC 7.7 6.3 6.4  NEUTROABS 5.0  --   --   HGB 14.4 13.3 13.3  HCT 41.8 38.0 39.1  MCV 80.7 78.7 79.1  PLT 219 221 204   Cardiac Enzymes:  Recent Labs Lab 07/01/12 2138 07/02/12 0200 07/02/12 0810 07/02/12 1322  TROPONINI <0.30 <0.30 <0.30 <0.30   BNP: No components found with this basename: POCBNP,  CBG: No results found for this basename: GLUCAP,  in the last 168 hours  Recent Results (from the past 240 hour(s))  CULTURE, BLOOD (ROUTINE X 2)     Status: None   Collection Time    07/02/12  1:35 AM      Result Value Range Status   Specimen Description BLOOD RIGHT ARM   Final   Special Requests     Final   Value: BOTTLES DRAWN AEROBIC AND ANAEROBIC 10CC,PT ON ZITHROMAX,ROCEPHIN   Culture  Setup Time 07/02/2012 10:08   Final   Culture     Final   Value:        BLOOD CULTURE RECEIVED NO GROWTH TO  DATE CULTURE WILL BE HELD FOR 5 DAYS BEFORE ISSUING A FINAL NEGATIVE REPORT   Report Status PENDING   Incomplete  CULTURE, BLOOD (ROUTINE X 2)     Status: None   Collection Time    07/02/12  1:45 AM      Result Value Range Status   Specimen Description BLOOD RIGHT HAND   Final   Special Requests     Final   Value: BOTTLES DRAWN AEROBIC ONLY 7CC,PT ON ZITHROMAX,ROCEPHIN   Culture  Setup Time 07/02/2012 10:08   Final   Culture     Final   Value:        BLOOD CULTURE RECEIVED NO GROWTH TO DATE CULTURE WILL BE HELD FOR 5 DAYS BEFORE ISSUING A FINAL NEGATIVE REPORT   Report Status PENDING   Incomplete     Studies:              All Imaging reviewed and is as per above notation   Scheduled Meds: . amoxicillin-clavulanate  1 tablet Oral Q12H  . aspirin  325 mg Oral Daily  .  azithromycin  500 mg Oral Q24H  . heparin  5,000 Units Subcutaneous Q8H  . levothyroxine  100 mcg Oral Custom  . [START ON 07/08/2012] levothyroxine  150 mcg Oral Custom  . mometasone-formoterol  2 puff Inhalation BID   Continuous Infusions:    Assessment/Plan: 1. Failed community acquired pneumonia treatment-transitioned from IV Cefepime and Oral Azithro to Augmentin/Azithro for about 5 day subsequent to this on 05/02/2014am.  2. Nausea and vomiting-tension secondary to medication. If this recurs we'll switch antibiotics around 3. ? Vertigo versus subacute CVA and MRA of the brain is negative-PTOT thought that this may have been secondary to vestibular insufficiency and may need to vestibular rehabilitation-old regardless place patient on aspirin 325 mg daily 4. Hypothyroidism-continue levothyroxine 150 mcg daily 5. Allergies-continue mometasone/formoterol 2 puff twice a day  Code Status: FUll Family Communication: Discussed with family at bedside Disposition Plan: inpatient   Pleas Koch, MD  [facility] Pager [phone] 07/03/2012, 4:18 PM    LOS: 2 days

## 2012-07-04 LAB — CBC
HCT: 38.6 % (ref 36.0–46.0)
MCHC: 33.9 g/dL (ref 30.0–36.0)
MCV: 78.5 fL (ref 78.0–100.0)
RDW: 14.9 % (ref 11.5–15.5)

## 2012-07-04 MED ORDER — ASPIRIN 325 MG PO TABS: 325.0000 mg | ORAL_TABLET | Freq: Every day | ORAL | Status: DC

## 2012-07-04 MED ORDER — DOXYCYCLINE HYCLATE 100 MG PO TABS
100.0000 mg | ORAL_TABLET | Freq: Two times a day (BID) | ORAL | Status: DC
  Administered 2012-07-04: 100 mg via ORAL
  Filled 2012-07-04 (×2): qty 1

## 2012-07-04 MED ORDER — DOXYCYCLINE HYCLATE 100 MG PO TABS: 100.0000 mg | ORAL_TABLET | Freq: Two times a day (BID) | ORAL | Status: DC

## 2012-07-04 MED ORDER — ONDANSETRON HCL 4 MG/2ML IJ SOLN: 4.0000 mg | Freq: Four times a day (QID) | INTRAMUSCULAR | Status: DC | PRN

## 2012-07-04 NOTE — Discharge Summary (Signed)
Physician Discharge Summary  AKERIA HEDSTROM ZOX:[id number] DOB: 09-18-1940 DOA: 07/01/2012  PCP: Marga Melnick, MD  Admit date: 07/01/2012 Discharge date: 07/04/2012  Time spent: 40 minutes  Recommendations for Outpatient Follow-up:  1. Complete doxycycline 100 twice a day 07/07/2012 2. Followup with primary care physician for consideration of echocardiogram and carotid Doppler-it is unlikely that she had a recent stroke   Discharge Diagnoses:  Principal Problem:   CAP (community acquired pneumonia) Active Problems:   Chest pain   Abnormal CT scan, head   Discharge Condition: Good  Diet recommendation: Regular  Filed Weights   07/01/12 2038 07/02/12 0554  Weight: 69.854 kg (154 lb) 69.854 kg (154 lb)    History of present illness:  72 yr old female admitted 07/02/12 with Partially Rx CAP-Because of feeling weak and unbalancced, also had a Ct head on admission showing an age-in determinant stroke On admission to now have a white count but had chest pain and this was ruled out she went underwent a limited workup for stroke versus CVA with MRI  Hospital Course:  1. Failed community acquired pneumonia treatment-continue azithromycin cefepime IV-potentially transition to oral equivalent of Augmentin-she did not tolerate Augmentin and had nausea and vomiting with this. And sister transitioned to doxycycline until 07/07/2012 2. ? Vertigo versus subacute CVA and MRA of the brain is negative therapy saw her and thought that she might have more vestibular insufficiency. Further workup was not pursued. She was recommended to followup with primary care physician for further consideration of outpatient vestibular rehabilitation. She was nonetheless placed on aspirin 325 and I leave this up to the discretion of primary care physician to workup with carotid Doppler and echocardiogram-she had a normal nuclear stress 12/07/1998 3. Hypothyroidism-continue levothyroxine 100 mcg  daily 4. Allergies-continue mometasone/formoterol 2 puff twice a day   Consultants:  None currently Procedures:  CT head 4/30  CXr 4/30  MRI brain 5/1 Antibiotics:  Azithromycin 5/1-5/2 Cefepime 5/1-5/2 Augmentin 5/2-5/3 Doxycyclin3 100 mg bid to finish 07/07/12  Discharge Exam: Filed Vitals:   07/03/12 1344 07/03/12 1347 07/03/12 2100 07/04/12 0500  BP: 205/95 165/97 118/70 141/61  Pulse: 58  75 68  Temp: 97.8 F (36.6 C)  99.3 F (37.4 C) 97.7 F (36.5 C)  TempSrc: Oral     Resp: 18  18 16   Height:      Weight:      SpO2: 88%  96% 96%   Alert pleasant oriented wishes to have a regular diet again General: No apparent distress Cardiovascular: S1-S2 no murmur rub or gallop Respiratory: Clinically clear no added sound  Discharge Instructions  Discharge Orders   Future Orders Complete By Expires     Diet - low sodium heart healthy  As directed     Discharge instructions  As directed     Comments:      Call your doctor if you feel feverish, ill or coughing sputum Finish antibiotics 07/07/12-stay out of sun.  Take it with food Consider getting further testing done by ur primary physician like am Echo/carotid doppler-It is unlikely you had a stroke You might need vestibular rehab with out-patient therapy if you feel dizzy    Increase activity slowly  As directed         Medication List    STOP taking these medications       azithromycin 250 MG tablet  Commonly known as:  ZITHROMAX     predniSONE 20 MG tablet  Commonly known as:  DELTASONE  TAKE these medications       acetaminophen 650 MG CR tablet  Commonly known as:  TYLENOL  Take 650 mg by mouth daily.     aspirin 325 MG tablet  Take 1 tablet (325 mg total) by mouth daily.     DAYQUIL MULTI-SYMPTOM PO  Take 1 capsule by mouth at bedtime as needed (for cold).     Digestive Enzymes Chew  Chew 1 tablet by mouth 2 (two) times daily.     doxycycline 100 MG tablet  Commonly known as:  VIBRA-TABS   Take 1 tablet (100 mg total) by mouth every 12 (twelve) hours.     Fluticasone-Salmeterol 250-50 MCG/DOSE Aepb  Commonly known as:  ADVAIR DISKUS  Inhale 1 puff into the lungs 2 (two) times daily.     GLUCOSAMINE CHONDR COMPLEX PO  Take 1 tablet by mouth daily.     HYDROcodone-homatropine 5-1.5 MG/5ML syrup  Commonly known as:  HYCODAN  Take 5 mLs by mouth every 6 (six) hours as needed for cough.     ICAPS Tabs  Take 2 tablets by mouth daily. AREDS 1+2     levothyroxine 100 MCG tablet  Commonly known as:  SYNTHROID, LEVOTHROID  Take 100 mcg by mouth daily before breakfast. Takes on wednesdays  All other days takes daily     OVER THE COUNTER MEDICATION  Take 2 each by mouth 2 (two) times daily. JUICE PLUS chews-Veggie blend natural food supplement     OVER THE COUNTER MEDICATION  Take 2 each by mouth 2 (two) times daily. JUICE PLUS chews-fruit blend natural food supplement     Vitamin C CR 1000 MG Tbcr  Take 1 each by mouth daily. EMERGEN-C PACKET       Allergies  Allergen Reactions  . Phenergan (Promethazine Hcl) Other (See Comments)    Jerking/agitation  . Levofloxacin Nausea And Vomiting  . Pravastatin Sodium Nausea And Vomiting      The results of significant diagnostics from this hospitalization (including imaging, microbiology, ancillary and laboratory) are listed below for reference.    Significant Diagnostic Studies: Dg Chest 2 View  07/01/2012  *RADIOLOGY REPORT*  Clinical Data: Right-sided chest pain.  Nausea and dizziness for 3 days.  Recent diagnosis of bronchitis.  CHEST - 2 VIEW  Comparison: 11/17/2010  Findings: Shallow inspiration.  Borderline heart size with normal pulmonary vascularity.  Infiltration in both lung bases may represent edema or pneumonia.  No blunting of costophrenic angles. No pneumothorax.  Mediastinal contours appear intact.  Degenerative changes in the thoracic spine.  IMPRESSION: Airspace infiltration in the lung bases  may represent edema or pneumonia.   Original Report Authenticated By: Burman Nieves, M.D.    Ct Head Wo Contrast  07/01/2012  *RADIOLOGY REPORT*  Clinical Data: Headache.  Chest pain.  Dizziness and weakness.  CT HEAD WITHOUT CONTRAST  Technique:  Contiguous axial images were obtained from the base of the skull through the vertex without contrast.  Comparison: None.  Findings: Patchy low attenuation change throughout the deep white matter consistent with small vessel ischemia.  Focal low attenuation in the left parietal region extends to the cortex suggesting age indeterminate infarct. No mass effect or midline shift.  No ventricular dilatation.  No abnormal extra-axial fluid collections.  Gray-white matter junctions are distinct.  Basal cisterns are not effaced.  No evidence of acute intracranial hemorrhage.  Vascular calcifications.  No depressed skull fractures.  Visualized paranasal sinuses and mastoid air cells are not opacified.  IMPRESSION: Diffuse white matter disease likely representing small vessel ischemia.  Focal low attenuation in the left parietal region extensive cortex suggesting age indeterminate infarct. No acute intracranial hemorrhage or mass effect.   Original Report Authenticated By: Burman Nieves, M.D.    Mr Brain Wo Contrast  07/02/2012  *RADIOLOGY REPORT*  Clinical Data: Weakness. Off balance for past 2 days.  Chest pain. Hyperlipidemia.  MRI HEAD WITHOUT CONTRAST  Technique:  Multiplanar, multiecho pulse sequences of the brain and surrounding structures were obtained according to standard protocol without intravenous contrast.  Comparison: 07/01/2012 CT.  No comparison MR.  Findings: No acute infarct.  Prominent white matter type changes most consistent with result of small vessel disease in a patient of this age and history of hyperlipidemia.  No intracranial mass lesion detected on this unenhanced exam.  Global atrophy without hydrocephalus.  No intracranial hemorrhage.   Prominent perivascular spaces.  Major intracranial vascular structures are patent.  IMPRESSION: No acute infarct.  Prominent small vessel disease type changes.   Original Report Authenticated By: Lacy Duverney, M.D.     Microbiology: Recent Results (from the past 240 hour(s))  CULTURE, BLOOD (ROUTINE X 2)     Status: None   Collection Time    07/02/12  1:35 AM      Result Value Range Status   Specimen Description BLOOD RIGHT ARM   Final   Special Requests     Final   Value: BOTTLES DRAWN AEROBIC AND ANAEROBIC 10CC,PT ON ZITHROMAX,ROCEPHIN   Culture  Setup Time 07/02/2012 10:08   Final   Culture     Final   Value:        BLOOD CULTURE RECEIVED NO GROWTH TO DATE CULTURE WILL BE HELD FOR 5 DAYS BEFORE ISSUING A FINAL NEGATIVE REPORT   Report Status PENDING   Incomplete  CULTURE, BLOOD (ROUTINE X 2)     Status: None   Collection Time    07/02/12  1:45 AM      Result Value Range Status   Specimen Description BLOOD RIGHT HAND   Final   Special Requests     Final   Value: BOTTLES DRAWN AEROBIC ONLY 7CC,PT ON ZITHROMAX,ROCEPHIN   Culture  Setup Time 07/02/2012 10:08   Final   Culture     Final   Value:        BLOOD CULTURE RECEIVED NO GROWTH TO DATE CULTURE WILL BE HELD FOR 5 DAYS BEFORE ISSUING A FINAL NEGATIVE REPORT   Report Status PENDING   Incomplete     Labs: Basic Metabolic Panel:  Recent Labs Lab 07/01/12 2138 07/02/12 0200 07/03/12 0600  NA 140 140 138  K 4.8 3.9 3.9  CL 100 103 102  CO2 27 26 29   GLUCOSE 98 91 106*  BUN 24* 20 16  CREATININE 0.80 0.81 0.85  CALCIUM 9.5 8.8 8.2*   Liver Function Tests:  Recent Labs Lab 07/01/12 2138  AST 36  ALT 21  ALKPHOS 74  BILITOT 0.4  PROT 6.8  ALBUMIN 3.8   No results found for this basename: LIPASE, AMYLASE,  in the last 168 hours No results found for this basename: AMMONIA,  in the last 168 hours CBC:  Recent Labs Lab 07/01/12 2138 07/02/12 0200 07/03/12 0600 07/04/12 0445  WBC 7.7 6.3 6.4 6.7  NEUTROABS  5.0  --   --   --   HGB 14.4 13.3 13.3 13.1  HCT 41.8 38.0 39.1 38.6  MCV 80.7 78.7 79.1 78.5  PLT  219 221 204 199   Cardiac Enzymes:  Recent Labs Lab 07/01/12 2138 07/02/12 0200 07/02/12 0810 07/02/12 1322  TROPONINI <0.30 <0.30 <0.30 <0.30   BNP: BNP (last 3 results) No results found for this basename: PROBNP,  in the last 8760 hours CBG: No results found for this basename: GLUCAP,  in the last 168 hours     Signed:  Rhetta Mura  [facility] 07/04/2012, 8:03 AM

## 2012-07-04 NOTE — Progress Notes (Signed)
Patient is concerned about taking the Augmentin dose tonight because she became nauseated and vomited after taking a dose earlier today. She was given Phenergan IV and stated that she became "jerky and agitated". She requested I put Phenergan as an allergy. Notified Elray Mcgregor, NP of patient's concerns. Will f/u in the AM with rounding physician. Will continue to monitor. Earnest Conroy RN

## 2012-07-08 LAB — CULTURE, BLOOD (ROUTINE X 2): Culture: NO GROWTH

## 2012-07-10 ENCOUNTER — Encounter: Payer: Self-pay | Admitting: Internal Medicine

## 2012-07-10 ENCOUNTER — Ambulatory Visit (INDEPENDENT_AMBULATORY_CARE_PROVIDER_SITE_OTHER): Payer: Medicare Other | Admitting: Internal Medicine

## 2012-07-10 ENCOUNTER — Ambulatory Visit (HOSPITAL_BASED_OUTPATIENT_CLINIC_OR_DEPARTMENT_OTHER)
Admission: RE | Admit: 2012-07-10 | Discharge: 2012-07-10 | Disposition: A | Discharge status: Home / Self Care | Payer: Medicare Other | Source: Ambulatory Visit | Attending: Internal Medicine | Admitting: Internal Medicine

## 2012-07-10 VITALS — BP 122/78 | HR 64 | Wt 153.0 lb

## 2012-07-10 DIAGNOSIS — Z23 Encounter for immunization: Secondary | ICD-10-CM

## 2012-07-10 DIAGNOSIS — R918 Other nonspecific abnormal finding of lung field: Secondary | ICD-10-CM | POA: Diagnosis not present

## 2012-07-10 DIAGNOSIS — R930 Abnormal findings on diagnostic imaging of skull and head, not elsewhere classified: Secondary | ICD-10-CM | POA: Diagnosis not present

## 2012-07-10 DIAGNOSIS — J189 Pneumonia, unspecified organism: Secondary | ICD-10-CM

## 2012-07-10 NOTE — Progress Notes (Signed)
  Subjective:    Patient ID: Amanda [name], female    DOB: January 17, 1941, 72 y.o.   MRN: [id number]  HPI  Her hospital course was reviewed and x-rays visualized. The chest x-ray 07/01/12 revealed increased lower lobe infiltrates, left greater than right. There was no followup film. She has completed her doxycycline.  By history this is her second episode of pneumonia since 2011. She has never smoked and has no past history of asthma.  She has not had the pneumonia vaccine. She did not take the influenza vaccine in 2013.  Reviewing the chart indicates that her white count was normal. I see no total protein assessment. She has no past history to suggest immune compromise such as chronic steroid use or immunosuppressive drug therapy.    Review of Systems  Since discharge she is not having fever, chills, or sweats. She has no sputum production. She's had some intermittent cough. She had some mental status changes 4/30. 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 The MRI revealed only age-related small vessel changes and atrophy diffusely. I've asked the radiologist to clarify this discrepancy.  She is taking a full dose aspirin. Her most recent lipids were excellent with an LDL of 91 and HDL of 51.     Objective:   Physical Exam General appearance:good health ;well nourished; no acute distress or increased work of breathing is present.  No  lymphadenopathy about the head, neck, or axilla noted.  Appears younger than stated age  Eyes: No conjunctival inflammation or lid edema is present.   Ears:  External ear exam shows no significant lesions or deformities.  Otoscopic examination reveals clear canals, tympanic membranes are intact bilaterally without bulging, retraction, inflammation or discharge. There is a scar in the right tympanic membrane Nose:  External nasal examination shows no deformity or inflammation. Nasal mucosa are pink and moist without lesions or exudates. No septal  dislocation or deviation.No obstruction to airflow.   Oral exam: Dental hygiene is good; lips and gums are healthy appearing.There is no oropharyngeal erythema or exudate noted.   Neck:  No deformities, masses, or tenderness noted.     Heart:  Normal rate and regular rhythm. S1 and S2 normal without gallop, murmur, click, rub or other extra sounds.   Lungs: Chest is clear to auscultation except for very faint dry rales at the bases, possibly slightly greater on the left.No increased work of breathing.    Extremities:  No cyanosis, edema, or clubbing  noted    Skin: Warm & dry          Assessment & Plan:  See Problem List updates

## 2012-07-10 NOTE — Patient Instructions (Addendum)
Order for x-rays entered into  the computer; these will be performed at [facility]. No appointment is necessary.   Please  blowup at least 10  balloons a day to enhance inflation of the lungs and prevent atelectasis as we discussed. 

## 2012-07-10 NOTE — Assessment & Plan Note (Signed)
Pneumonia vaccine will be administered. Modified incentive spirometry will be recommended with followup chest x-ray in 5-7 days. Pulmonary function tests will be performed if the x-ray is completely normal and she has no active symptoms. If residual changes persist on x-ray; fine resolution CT scan would be indicated.

## 2012-07-10 NOTE — Assessment & Plan Note (Signed)
Lipids are excellent; low-dose aspirin should be continued prophylactically

## 2012-07-21 DIAGNOSIS — D0439 Carcinoma in situ of skin of other parts of face: Secondary | ICD-10-CM | POA: Diagnosis not present

## 2012-07-21 DIAGNOSIS — D0430 Carcinoma in situ of skin of unspecified part of face: Secondary | ICD-10-CM | POA: Diagnosis not present

## 2012-07-24 DIAGNOSIS — R351 Nocturia: Secondary | ICD-10-CM | POA: Diagnosis not present

## 2012-07-24 DIAGNOSIS — N3946 Mixed incontinence: Secondary | ICD-10-CM | POA: Diagnosis not present

## 2012-08-14 ENCOUNTER — Other Ambulatory Visit: Payer: Self-pay | Admitting: Internal Medicine

## 2012-08-14 NOTE — Telephone Encounter (Signed)
#   30, R X 2 

## 2012-08-14 NOTE — Telephone Encounter (Signed)
Dr.Hopper please advise, I spoke with patient, patient would like to restart this medication

## 2012-10-19 ENCOUNTER — Other Ambulatory Visit: Payer: Self-pay | Admitting: Internal Medicine

## 2012-10-19 NOTE — Telephone Encounter (Signed)
Rx was filled for levothyroxine 0.100 mg Ag cma

## 2012-11-18 DIAGNOSIS — H40019 Open angle with borderline findings, low risk, unspecified eye: Secondary | ICD-10-CM | POA: Diagnosis not present

## 2012-11-18 DIAGNOSIS — H18599 Other hereditary corneal dystrophies, unspecified eye: Secondary | ICD-10-CM | POA: Diagnosis not present

## 2012-11-18 DIAGNOSIS — H18519 Endothelial corneal dystrophy, unspecified eye: Secondary | ICD-10-CM | POA: Diagnosis not present

## 2012-11-18 DIAGNOSIS — H04129 Dry eye syndrome of unspecified lacrimal gland: Secondary | ICD-10-CM | POA: Diagnosis not present

## 2013-01-07 ENCOUNTER — Other Ambulatory Visit: Payer: Self-pay

## 2013-01-21 ENCOUNTER — Ambulatory Visit (INDEPENDENT_AMBULATORY_CARE_PROVIDER_SITE_OTHER): Payer: Medicare Other

## 2013-01-21 DIAGNOSIS — Z23 Encounter for immunization: Secondary | ICD-10-CM

## 2013-03-08 ENCOUNTER — Other Ambulatory Visit: Payer: Self-pay

## 2013-03-08 DIAGNOSIS — Z1231 Encounter for screening mammogram for malignant neoplasm of breast: Secondary | ICD-10-CM

## 2013-04-08 DIAGNOSIS — H35039 Hypertensive retinopathy, unspecified eye: Secondary | ICD-10-CM | POA: Diagnosis not present

## 2013-04-08 DIAGNOSIS — H524 Presbyopia: Secondary | ICD-10-CM | POA: Diagnosis not present

## 2013-04-08 DIAGNOSIS — H18519 Endothelial corneal dystrophy, unspecified eye: Secondary | ICD-10-CM | POA: Diagnosis not present

## 2013-04-08 DIAGNOSIS — H40019 Open angle with borderline findings, low risk, unspecified eye: Secondary | ICD-10-CM | POA: Diagnosis not present

## 2013-04-15 ENCOUNTER — Ambulatory Visit
Admission: RE | Admit: 2013-04-15 | Discharge: 2013-04-15 | Disposition: A | Discharge status: Home / Self Care | Payer: Medicare Other | Source: Ambulatory Visit

## 2013-04-15 DIAGNOSIS — Z1231 Encounter for screening mammogram for malignant neoplasm of breast: Secondary | ICD-10-CM | POA: Diagnosis not present

## 2013-04-22 ENCOUNTER — Other Ambulatory Visit: Payer: Self-pay | Admitting: Internal Medicine

## 2013-05-07 DIAGNOSIS — Z0000 Encounter for general adult medical examination without abnormal findings: Secondary | ICD-10-CM | POA: Diagnosis not present

## 2013-05-31 ENCOUNTER — Encounter: Payer: Self-pay | Admitting: Gynecology

## 2013-05-31 ENCOUNTER — Ambulatory Visit (INDEPENDENT_AMBULATORY_CARE_PROVIDER_SITE_OTHER): Payer: Medicare Other | Admitting: Gynecology

## 2013-05-31 VITALS — BP 120/78 | Ht 62.0 in | Wt 147.0 lb

## 2013-05-31 DIAGNOSIS — M899 Disorder of bone, unspecified: Secondary | ICD-10-CM | POA: Diagnosis not present

## 2013-05-31 DIAGNOSIS — Z8041 Family history of malignant neoplasm of ovary: Secondary | ICD-10-CM

## 2013-05-31 DIAGNOSIS — M949 Disorder of cartilage, unspecified: Secondary | ICD-10-CM

## 2013-05-31 DIAGNOSIS — N952 Postmenopausal atrophic vaginitis: Secondary | ICD-10-CM | POA: Diagnosis not present

## 2013-05-31 DIAGNOSIS — Z803 Family history of malignant neoplasm of breast: Secondary | ICD-10-CM

## 2013-05-31 DIAGNOSIS — M8580 Other specified disorders of bone density and structure, unspecified site: Secondary | ICD-10-CM

## 2013-05-31 NOTE — Patient Instructions (Addendum)
Bone Densitometry Bone densitometry is a special X-ray that measures your bone density and can be used to help predict your risk of bone fractures. This test is used to determine bone mineral content and density to diagnose osteoporosis. Osteoporosis is the loss of bone that may cause the bone to become weak. Osteoporosis commonly occurs in women entering menopause. However, it may be found in men and in people with other diseases. PREPARATION FOR TEST No preparation necessary. WHO SHOULD BE TESTED?  All women older than 65.  Postmenopausal women (50 to 65) with risk factors for osteoporosis.  People with a previous fracture caused by normal activities.  People with a small body frame (less than 127 poundsor a body mass index [BMI] of less than 21).  People who have a parent with a hip fracture or history of osteoporosis.  People who smoke.  People who have rheumatoid arthritis.  Anyone who engages in excessive alcohol use (more than 3 drinks most days).  Women who experience early menopause. WHEN SHOULD YOU BE RETESTED? Current guidelines suggest that you should wait at least 2 years before doing a bone density test again if your first test was normal.Recent studies indicated that women with normal bone density may be able to wait a few years before needing to repeat a bone density test. You should discuss this with your caregiver.  NORMAL FINDINGS   Normal: less than standard deviation below normal (greater than -1).  Osteopenia: 1 to 2.5 standard deviations below normal (-1 to -2.5).  Osteoporosis: greater than 2.5 standard deviations below normal (less than -2.5). Test results are reported as a "T score" and a "Z score."The T score is a number that compares your bone density with the bone density of healthy, young women.The Z score is a number that compares your bone density with the scores of women who are the same age, gender, and race.  Ranges for normal findings may vary  among different laboratories and hospitals. You should always check with your doctor after having lab work or other tests done to discuss the meaning of your test results and whether your values are considered within normal limits. MEANING OF TEST  Your caregiver will go over the test results with you and discuss the importance and meaning of your results, as well as treatment options and the need for additional tests if necessary. OBTAINING THE TEST RESULTS It is your responsibility to obtain your test results. Ask the lab or department performing the test when and how you will get your results. Document Released: 03/12/2004 Document Revised: 05/13/2011 Document Reviewed: 04/04/2010 [employer] Patient Information 2014 [employer]. BRCA-1 and BRCA-2 BRCA-1 and BRCA-2 are 2 genes that are linked with hereditary breast and ovarian cancers. About 200,000 women are diagnosed with invasive breast cancer each year and about 23,000 with ovarian cancer (according to the American Cancer Society). Of these cancers, about 5% to 10% will be due to a mutation in one of the BRCA genes. Men can also inherit an increased risk of developing breast cancer, primarily from an alteration in the BRCA-2 gene.  Individuals with mutations in BRCA1 or BRCA2 have significantly elevated risks for breast cancer (up to 80% lifetime risk), ovarian cancer (up to 40% lifetime risk), bilateral breast cancer and other types of cancers. BRCA mutations are inherited and passed from generation to generation. One half of the time, they are passed from the father's side of the family.  The DNA in white blood cells is used to   detect mutations in the BRCA genes. While the gene products (proteins) of the BRCA genes act only in breast and ovarian tissue, the genes are present in every cell of the body and blood is the most easily accessible source of that DNA. PREPARATION FOR TEST The test for BRCA mutations is done on a blood sample collected  by needle from a vein in the arm. The test does not require surgical biopsy of breast or ovarian tissue.  NORMAL FINDINGS No genetic mutations. Ranges for normal findings may vary among different laboratories and hospitals. You should always check with your doctor after having lab work or other tests done to discuss the meaning of your test results and whether your values are considered within normal limits. MEANING OF TEST  Your caregiver will go over the test results with you and discuss the importance and meaning of your results, as well as treatment options and the need for additional tests if necessary. OBTAINING THE TEST RESULTS It is your responsibility to obtain your test results. Ask the lab or department performing the test when and how you will get your results. OTHER THINGS TO KNOW Your test results may have implications for other family members. When one member of a family is tested for BRCA mutations, issues often arise about how or whether to share this information with other family members. Seek advice from a genetic counselor about communication of result with your family members.  Pre and post test consultation with a health care provider knowledgeable about genetic testing cannot be overemphasized.  There are many issues to be considered when preparing for a genetic test and upon learning the results, and a genetic counselor has the knowledge and experience to help you sort through them.  If the BRCA test is positive, the options include increased frequency of check-ups (e.g., mammography, blood tests for CA-125, or transvaginal ultrasonography); medications that could reduce risk (e.g., oral contraceptives or tamoxifen); or surgical removal of the ovaries or breasts. There are a number of variables involved and it is important to discuss your options with your doctor and genetic counselor. Research studies have reported that for every 1000 women negative for BRCA mutations, between  12 and 83 of them will develop breast cancer by age 48 and between 35 and 89 will develop ovarian cancer by age 60. The risk increases with age. The test can be ordered by a doctor, preferably by one who can also offer genetic counseling. The blood sample will be sent to a laboratory that specializes in BRCA testing. The American Society of Clinical Oncology and the [place] encourage women seeking the test to participate in long-term outcome studies to help gather information on the effectiveness of different check-up and treatment options. Document Released: 03/14/2004 Document Revised: 05/13/2011 Document Reviewed: 01/25/2008 [facility] Patient Information 2014 [place], [place].

## 2013-05-31 NOTE — Progress Notes (Signed)
Amanda [name] [birth year] [id number]   History:    73 y.o.  for GYN followup exam. Patient with history of osteopenia and her records indicated that she came off the Fosamax in 2010 and has been on a drug called A. since then. Her last bone density study 2013 demonstrated the lowest T score was at the left femoral neck with a value of 1.7. Prior to that she had a bone density study done at another facility for which we have no records to compare. She was scheduled for one and a few weeks here in our office. Patient had been complaining for a few days of some right breast tenderness but none today. She felt no masses and no nipple discharge no recent injury. 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  Patient's mother had breast cancer at the age of 46 and later at age 36 developed ovarian cancer. Patient previously advised and counseled to proceed with BRCA1 and BRCA2 gene mutation screening as well as consultation with geneticist the patient has declined.  Patient would know prior history of abnormal Pap smear. Patient had a normal colonoscopy 2014. Patient with a normal mammogram this year.  Patient's PCP has been Dr. Linna Darner that he has retired A. she will be seen Dr. Etter Sjogren.  Past medical history,surgical history, family history and social history were all reviewed and documented in the EPIC chart.  Gynecologic History No LMP recorded. Patient is postmenopausal. Contraception: post menopausal status Last Pap:  2013. Results were: normal Last mammogram:  2015. Results were: normal  Obstetric History OB History  Gravida Para Term Preterm AB SAB TAB Ectopic Multiple Living  '3 3  2      2    ' # Outcome Date GA Lbr Len/2nd Weight Sex Delivery Anes PTL Lv  3 PAR     M CS   SB  2 PRE     M SVD  Y Y  1 PRE     F SVD  N Y       ROS: A ROS was performed and pertinent positives and negatives are included in the history.  GENERAL:  No fevers or chills. HEENT: No change in vision, no earache, sore throat or sinus congestion. NECK: No pain or stiffness. CARDIOVASCULAR: No chest pain or pressure. No palpitations. PULMONARY: No shortness of breath, cough or wheeze. GASTROINTESTINAL: No abdominal pain, nausea, vomiting or diarrhea, melena or bright red blood per rectum. GENITOURINARY: No urinary frequency, urgency, hesitancy or dysuria. MUSCULOSKELETAL: No joint or muscle pain, no back pain, no recent trauma. DERMATOLOGIC: No rash, no itching, no lesions. ENDOCRINE: No polyuria, polydipsia, no heat or cold intolerance. No recent change in weight. HEMATOLOGICAL: No anemia or easy bruising or bleeding. NEUROLOGIC: No headache, seizures, numbness, tingling or weakness. PSYCHIATRIC: No depression, no loss of interest in normal activity or change in sleep pattern.     Exam: chaperone present  BP 120/78  Ht '5\' 2"'  (1.575 m)  Wt 147 lb (66.679 kg)  BMI 26.88 kg/m2  Body mass index is 26.88 kg/(m^2).  General appearance : Well developed well nourished female. No acute distress HEENT: Neck supple, trachea midline, no carotid bruits, no thyroidmegaly Lungs: Clear to auscultation, no rhonchi or wheezes, or rib retractions  Heart: Regular rate and rhythm, no murmurs or gallops Breast:Examined in sitting and supine position were symmetrical in appearance, no palpable masses or tenderness,  no skin retraction, no nipple inversion,  no nipple discharge, no skin discoloration, no axillary or supraclavicular lymphadenopathy Abdomen: no palpable masses or tenderness, no rebound or guarding Extremities: no edema or skin discoloration or tenderness  Pelvic:  Bartholin, Urethra, Skene Glands: Within normal limits             Vagina: No gross lesions or discharge, vaginal atrophy   Cervix: No gross lesions or discharge  Uterus  axial, normal size, shape and consistency, non-tender and mobile  Adnexa  Without masses or tenderness  Anus and  perineum  normal   Rectovaginal  normal sphincter tone without palpated masses or tenderness             Hemoccult  PCP provides     Assessment/Plan:  73 y.o. female with history of osteopenia on drug called a day. Patient will schedule a bone density study here in our office in April. Due to strong family history of breast and ovarian cancer in her mother she was once again counseled on the recommendation for her to be tested with the BRCA1 and BRCA2 mutation screening along with consultation with the geneticist at the regional cancer center. We're going to screen her with an ultrasound each year at least. Pap smear was not done today in accordance to new guidelines. She was reminded of the importance of calcium and vitamin D for osteoporosis prevention. We are going to check a vitamin D level today.  Note: This dictation was prepared with  Dragon/digital dictation along withSmart phrase technology. Any transcriptional errors that result from this process are unintentional.   Terrance Mass MD, 3:13 PM 05/31/2013

## 2013-06-01 LAB — VITAMIN D 25 HYDROXY (VIT D DEFICIENCY, FRACTURES): Vit D, 25-Hydroxy: 37 ng/mL (ref 30–89)

## 2013-06-16 ENCOUNTER — Other Ambulatory Visit: Payer: Self-pay | Admitting: Gynecology

## 2013-06-16 ENCOUNTER — Ambulatory Visit (INDEPENDENT_AMBULATORY_CARE_PROVIDER_SITE_OTHER): Payer: Medicare Other | Admitting: Gynecology

## 2013-06-16 ENCOUNTER — Ambulatory Visit (INDEPENDENT_AMBULATORY_CARE_PROVIDER_SITE_OTHER): Payer: Medicare Other

## 2013-06-16 DIAGNOSIS — N83339 Acquired atrophy of ovary and fallopian tube, unspecified side: Secondary | ICD-10-CM | POA: Diagnosis not present

## 2013-06-16 DIAGNOSIS — Z8041 Family history of malignant neoplasm of ovary: Secondary | ICD-10-CM | POA: Diagnosis not present

## 2013-06-16 DIAGNOSIS — N858 Other specified noninflammatory disorders of uterus: Secondary | ICD-10-CM

## 2013-06-16 DIAGNOSIS — Z803 Family history of malignant neoplasm of breast: Secondary | ICD-10-CM | POA: Diagnosis not present

## 2013-06-16 DIAGNOSIS — D259 Leiomyoma of uterus, unspecified: Secondary | ICD-10-CM

## 2013-06-16 DIAGNOSIS — N859 Noninflammatory disorder of uterus, unspecified: Secondary | ICD-10-CM

## 2013-06-16 DIAGNOSIS — N952 Postmenopausal atrophic vaginitis: Secondary | ICD-10-CM

## 2013-06-16 NOTE — Progress Notes (Signed)
   Patient presented to the office today for screening ultrasound. Patient is a 73 year old with family history of ovarian cancer her mother. She was seen in the office her annual gynecological examination in March of 2015 C. present for additional detail.  Patient's mother had breast cancer at the age of 33 and later at age 26 developed ovarian cancer. Patient previously advised and counseled to proceed with BRCA1 and BRCA2 gene mutation screening as well as consultation with geneticist the patient has declined.  Ultrasound today: Uterus measures 4.9 x 4.6 x 2.8 cm endometrial stripe 1.6 mm. Right and left over both atrophic. No adnexal masses. No fluid in the cul-de-sac.  Patient scheduled for bone density study next few weeks. Patient otherwise scheduled to return back in one year for annual exam or when necessary.

## 2013-07-08 ENCOUNTER — Ambulatory Visit (INDEPENDENT_AMBULATORY_CARE_PROVIDER_SITE_OTHER): Payer: Medicare Other

## 2013-07-08 DIAGNOSIS — M899 Disorder of bone, unspecified: Secondary | ICD-10-CM | POA: Diagnosis not present

## 2013-07-08 DIAGNOSIS — M949 Disorder of cartilage, unspecified: Secondary | ICD-10-CM | POA: Diagnosis not present

## 2013-07-08 DIAGNOSIS — M8580 Other specified disorders of bone density and structure, unspecified site: Secondary | ICD-10-CM

## 2013-07-24 ENCOUNTER — Other Ambulatory Visit: Payer: Self-pay | Admitting: Internal Medicine

## 2013-08-02 ENCOUNTER — Other Ambulatory Visit: Payer: Self-pay | Admitting: Family Medicine

## 2013-08-26 ENCOUNTER — Ambulatory Visit (INDEPENDENT_AMBULATORY_CARE_PROVIDER_SITE_OTHER): Payer: Medicare Other | Admitting: Family Medicine

## 2013-08-26 ENCOUNTER — Encounter: Payer: Self-pay | Admitting: Family Medicine

## 2013-08-26 VITALS — BP 112/76 | HR 70 | Temp 97.8°F | Wt 148.0 lb

## 2013-08-26 DIAGNOSIS — J209 Acute bronchitis, unspecified: Secondary | ICD-10-CM | POA: Diagnosis not present

## 2013-08-26 MED ORDER — LORATADINE 10 MG PO TABS: 10.0000 mg | ORAL_TABLET | Freq: Every day | ORAL | Status: DC

## 2013-08-26 MED ORDER — ALBUTEROL SULFATE (2.5 MG/3ML) 0.083% IN NEBU
2.5000 mg | INHALATION_SOLUTION | Freq: Once | RESPIRATORY_TRACT | Status: AC
  Administered 2013-08-26: 2.5 mg via RESPIRATORY_TRACT

## 2013-08-26 MED ORDER — AZITHROMYCIN 250 MG PO TABS: ORAL_TABLET | ORAL | Status: DC

## 2013-08-26 NOTE — Patient Instructions (Signed)
Take z pack Use claritin and Mucinex or Mucinex DM for cough Go for cxr if symptoms persist over weekend

## 2013-08-26 NOTE — Progress Notes (Signed)
Subjective:     Amanda [name] is a 73 y.o. female here for evaluation of a cough. Onset of symptoms was 3 weeks ago. Symptoms have been gradually worsening since that time. The cough is barky and productive and is aggravated by reclining position. Associated symptoms include: shortness of breath, sputum production and wheezing. Patient does not have a history of asthma. Patient does not have a history of environmental allergens. Patient has not traveled recently. Patient does not have a history of smoking. Patient has not had a previous chest x-ray. Patient has not had a PPD done.  The following portions of the patient's history were reviewed and updated as appropriate:  She  has a past medical history of Hypothyroidism; Hyperlipidemia; Constipation, chronic; PONV (postoperative nausea and vomiting); GERD (gastroesophageal reflux disease); History of blood transfusion (1959); Shortness of breath; SUI (stress urinary incontinence, female); Shingles; and Macular degeneration (2013). She  does not have any pertinent problems on file. She  has past surgical history that includes Tonsillectomy and adenoidectomy; Cesarean section (9518); Wisdom tooth extraction; Cataract extraction (2009, 2011); Tubal ligation; Dilation and curettage of uterus; Carpal tunnel release (1999); Colonoscopy; and Hysteroscopy w/D&C (01/07/2012). Her family history includes Breast cancer (age of onset: 71) in her mother; COPD in her paternal grandfather; Diabetes in her father; Hyperlipidemia in her brother; Hypertension in her father; Kidney failure in her father; Ovarian cancer in her mother; Prostate cancer in her father; Stroke in her maternal grandfather. She  reports that she has never smoked. She has never used smokeless tobacco. She reports that she drinks alcohol. She reports that she does not use illicit drugs. She has a current medication list which includes the following prescription(s): acetaminophen, vitamin c cr,  digestive enzymes, glucosamine-chondroitin, levothyroxine, icaps, and pseudoephedrine-apap-dm. Current Outpatient Prescriptions on File Prior to Visit  Medication Sig Dispense Refill  . acetaminophen (TYLENOL) 650 MG CR tablet Take 650 mg by mouth daily.      . Ascorbic Acid (VITAMIN C CR) 1000 MG TBCR Take 1 each by mouth daily. EMERGEN-C PACKET      . Digestive Enzymes CHEW Chew 1 tablet by mouth 2 (two) times daily.      . Glucosamine-Chondroitin (GLUCOSAMINE CHONDR COMPLEX PO) Take 1 tablet by mouth daily.       [name] levothyroxine (SYNTHROID, LEVOTHROID) 100 MCG tablet Take 100 mcg by mouth daily before breakfast. Takes 141mcg on wednesdays All other days takes 147mcg daily      . Multiple Vitamins-Minerals (ICAPS) TABS Take 2 tablets by mouth daily. AREDS 1+2      . Pseudoephedrine-APAP-DM (DAYQUIL MULTI-SYMPTOM PO) Take 1 capsule by mouth at bedtime as needed (for cold).       No current facility-administered medications on file prior to visit.   She is allergic to phenergan; levofloxacin; and pravastatin sodium..  Review of Systems Pertinent items are noted in HPI.    Objective:    Oxygen saturation 95% on room air BP 112/76  Pulse 70  Temp(Src) 97.8 F (36.6 C) (Oral)  Wt 148 lb (67.132 kg)  SpO2 95% General appearance: alert, cooperative, appears stated age and no distress Ears: normal TM's and external ear canals both ears Nose: turbinates pink, swollen, no sinus tenderness Throat: lips, mucosa, and tongue normal; teeth and gums normal Neck: mild anterior cervical adenopathy, supple, symmetrical, trachea midline and thyroid not enlarged, symmetric, no tenderness/mass/nodules Lungs: clear to auscultation bilaterally Heart: S1, S2 normal    Assessment:    Acute Bronchitis  Plan:    Antibiotics per medication orders. Antitussives per medication orders. Avoid exposure to tobacco smoke and fumes. Call if shortness of breath worsens, blood in sputum, change in  character of cough, development of fever or chills, inability to maintain nutrition and hydration. Avoid exposure to tobacco smoke and fumes. Chest x-ray.

## 2013-08-26 NOTE — Progress Notes (Signed)
Pre visit review using our clinic review tool, if applicable. No additional management support is needed unless otherwise documented below in the visit note. 

## 2013-09-02 DIAGNOSIS — L821 Other seborrheic keratosis: Secondary | ICD-10-CM | POA: Diagnosis not present

## 2013-09-02 DIAGNOSIS — B353 Tinea pedis: Secondary | ICD-10-CM | POA: Diagnosis not present

## 2013-09-02 DIAGNOSIS — D239 Other benign neoplasm of skin, unspecified: Secondary | ICD-10-CM | POA: Diagnosis not present

## 2013-09-02 DIAGNOSIS — Z85828 Personal history of other malignant neoplasm of skin: Secondary | ICD-10-CM | POA: Diagnosis not present

## 2013-09-02 DIAGNOSIS — D2370 Other benign neoplasm of skin of unspecified lower limb, including hip: Secondary | ICD-10-CM | POA: Diagnosis not present

## 2013-09-02 DIAGNOSIS — I781 Nevus, non-neoplastic: Secondary | ICD-10-CM | POA: Diagnosis not present

## 2013-09-02 DIAGNOSIS — L570 Actinic keratosis: Secondary | ICD-10-CM | POA: Diagnosis not present

## 2013-10-21 ENCOUNTER — Encounter: Payer: Self-pay | Admitting: Podiatrist

## 2013-10-21 ENCOUNTER — Ambulatory Visit (INDEPENDENT_AMBULATORY_CARE_PROVIDER_SITE_OTHER): Payer: Medicare Other | Admitting: Podiatrist

## 2013-10-21 VITALS — BP 141/77 | HR 67 | Resp 16 | Ht 62.0 in | Wt 150.0 lb

## 2013-10-21 DIAGNOSIS — M216X9 Other acquired deformities of unspecified foot: Secondary | ICD-10-CM | POA: Diagnosis not present

## 2013-10-21 DIAGNOSIS — Q828 Other specified congenital malformations of skin: Secondary | ICD-10-CM | POA: Diagnosis not present

## 2013-10-21 NOTE — Patient Instructions (Signed)
Salicylic Acid topical gel, cream, lotion, solution What is this medicine? SALICYCLIC ACID (SAL i SIL ik AS id) breaks down layers of thick skin. It is used to treat common and plantar warts, psoriasis, calluses, and corns. It is also used to treat or to prevent acne. This medicine may be used for other purposes; ask your health care provider or pharmacist if you have questions. COMMON BRAND NAME(S): [place] Jabs, Clear Away Liquid, Compound W, Corn/Callus Remover, Dermarest Psoriasis Moisturizer, Dermarest Psoriasis Overnight Treatment, Dermarest Psoriasis Scalp Treatment, Dermarest Psoriasis Skin Treatment, DuoFilm Wart Remover, [place]ofilm, Hydrisalic, Keralyt, Neutrogena Acne Wash, [place], RE SA, SalAC, [employer], [place], [employer], [place], [place] 2 in 1 Anti-Dandruff, [place], [place], Wart-Off What should I tell my health care provider before I take this medicine? They need to know if you have any of these conditions: -child with chickenpox, the flu, or other viral infection -kidney disease -liver disease -an unusual or allergic reaction to salicylic acid, other medicines, foods, dyes, or preservatives -pregnant or trying to get pregnant -breast-feeding How should I use this medicine? This medicine is for external use only. Follow the directions on the label. Do not apply to raw or irritated skin. Avoid getting medicine in your eyes, lips, nose, mouth, or other sensitive areas. Use this medicine at regular intervals. Do not use more often than directed. Talk to your pediatrician regarding the use of this medicine in children. Special care may be needed. This medicine is not approved for use in children under 47 years old. Overdosage: If you think you have taken too much of this medicine contact a poison control center or emergency room at once. NOTE: This medicine is only for you. Do not share this medicine with others. What if I miss a dose? If you miss a dose, use it as soon as you  can. If it is almost time for your next dose, use only that dose. Do not use double or extra doses. What may interact with this medicine? -medicines that change urine pH like ammonium chloride, sodium bicarbonate, and others -medicines that treat or prevent blood clots like warfarin -methotrexate -pyrazinamide -some medicines for diabetes -some medicines for gout -steroid medicines like prednisone or cortisone This list may not describe all possible interactions. Give your health care provider a list of all the medicines, herbs, non-prescription drugs, or dietary supplements you use. Also tell them if you smoke, drink alcohol, or use illegal drugs. Some items may interact with your medicine. What should I watch for while using this medicine? Tell your doctor is your symptoms do not get better or if they get worse. This medicine can make you more sensitive to the sun. Keep out of the sun. If you cannot avoid being in the sun, wear protective clothing and use sunscreen. Do not use sun lamps or tanning beds/booths. Use of this medicine in children under 12 years or in patients with kidney or liver disease may increase the risk of serious side effects. These patients should not use this medicine over large areas of skin. If you notice symptoms such as nausea, vomiting, dizziness, loss of hearing, ringing in the ears, unusual weakness or tiredness, fast or labored breathing, diarrhea, or confusion, stop using this medicine and contact your doctor or health care professional. What side effects may I notice from receiving this medicine? Side effects that you should report to your doctor or health care professional as soon as possible: -allergic reactions like skin rash, itching or hives, swelling of the face,  lips, or tongue Side effects that usually do not require medical attention (report to your doctor or health care professional if they continue or are bothersome): -skin irritation This list may not  describe all possible side effects. Call your doctor for medical advice about side effects. You may report side effects to FDA at 1-800-FDA-1088. Where should I keep my medicine? Keep out of the reach of children. Store at room temperature between 15 and 30 degrees C (59 and 86 degrees F). Do not freeze. Throw away any unused medicine after the expiration date. NOTE: This sheet is a summary. It may not cover all possible information. If you have questions about this medicine, talk to your doctor, pharmacist, or health care provider.  2015, Elsevier/Gold Standard. (2007-10-23 13:36:20)

## 2013-10-21 NOTE — Progress Notes (Signed)
Chief Complaint  Patient presents with  . Foot Pain    i have something on the bottom of my rt foot     HPI: Patient is 73 y.o. female who presents today for painful lesion on the bottom of her right foot. She relates it's painful with direct pressure.  Physical Exam  Patient is awake, alert, and oriented x 3.  In no acute distress.  Vascular status is intact with palpable pedal pulses at 2/4 DP and PT bilateral and capillary refill time within normal limits. Neurological sensation is also intact bilaterally via Semmes Weinstein monofilament at 5/5 sites. Light touch, vibratory sensation, Achilles tendon reflex is intact. Dermatological exam reveals skin color, turger and texture as normal. No open lesions present.  Musculature intact with dorsiflexion, plantarflexion, inversion, eversion. Discrete porokeratotic lesion is present submetatarsal 2 right foot. Intact integument is present post debridement.  Assessment: Porokeratosis right foot  Plan: Debridement of lesion was accomplished today and padding was applied. She'll be seen back as needed for followup.

## 2013-11-02 ENCOUNTER — Other Ambulatory Visit: Payer: Self-pay | Admitting: Family Medicine

## 2013-11-03 NOTE — Telephone Encounter (Signed)
This patient had only seen you for an acute visit, Please advise if refill appropriate.      KP

## 2013-12-06 ENCOUNTER — Ambulatory Visit (INDEPENDENT_AMBULATORY_CARE_PROVIDER_SITE_OTHER): Payer: Medicare Other | Admitting: Family Medicine

## 2013-12-06 ENCOUNTER — Encounter: Payer: Self-pay | Admitting: Family Medicine

## 2013-12-06 VITALS — BP 188/96 | HR 62 | Temp 98.3°F | Ht 61.5 in | Wt 153.8 lb

## 2013-12-06 DIAGNOSIS — I889 Nonspecific lymphadenitis, unspecified: Secondary | ICD-10-CM | POA: Diagnosis not present

## 2013-12-06 DIAGNOSIS — E039 Hypothyroidism, unspecified: Secondary | ICD-10-CM

## 2013-12-06 MED ORDER — CEPHALEXIN 500 MG PO CAPS: 500.0000 mg | ORAL_CAPSULE | Freq: Three times a day (TID) | ORAL | Status: DC

## 2013-12-06 NOTE — Progress Notes (Signed)
Pre visit review using our clinic review tool, if applicable. No additional management support is needed unless otherwise documented below in the visit note. 

## 2013-12-06 NOTE — Progress Notes (Signed)
OFFICE NOTE  12/06/2013  CC:  Chief Complaint  Patient presents with  . Thyroid Nodule     HPI: Patient is a 73 y.o. Caucasian female who is here for nodule on neck.   Noted swelling under right jaw bone about 3 wks ago, feels the largest in the morning.  No pain, nontender to touch. Around this time she also had URI/PND mucous collecting in the throat.  Some PND coughing at night.  No fevers. These sx's ran their course in a couple of weeks.   She has never been a smoker.   Pertinent PMH:  PMH and PSH reviewed.  MEDS: Not taking azithromycin, claritin, or dayquil listed below Outpatient Prescriptions Prior to Visit  Medication Sig Dispense Refill  . Ascorbic Acid (VITAMIN C CR) 1000 MG TBCR Take 1 each by mouth daily. EMERGEN-C PACKET      . Digestive Enzymes CHEW Chew 1 tablet by mouth 2 (two) times daily.      [name] levothyroxine (SYNTHROID, LEVOTHROID) 100 MCG tablet TAKE 1&1/2 TABLETS BY MOUTH DAILY ON WEDNESDAY AND 1 TABLET EVERY DAY ALL OTHER DAYS  96 tablet  0  . Multiple Vitamins-Minerals (ICAPS) TABS Take 2 tablets by mouth daily. AREDS 1+2      . acetaminophen (TYLENOL) 650 MG CR tablet Take 650 mg by mouth daily.      [name] azithromycin (ZITHROMAX Z-PAK) 250 MG tablet As directed  6 each  0  . Glucosamine-Chondroitin (GLUCOSAMINE CHONDR COMPLEX PO) Take 1 tablet by mouth daily.       [name] levothyroxine (SYNTHROID, LEVOTHROID) 100 MCG tablet Take 100 mcg by mouth daily before breakfast. Takes 124mcg on wednesdays All other days takes 11mcg daily      . loratadine (CLARITIN) 10 MG tablet Take 1 tablet (10 mg total) by mouth daily.  30 tablet  11  . Pseudoephedrine-APAP-DM (DAYQUIL MULTI-SYMPTOM PO) Take 1 capsule by mouth at bedtime as needed (for cold).       No facility-administered medications prior to visit.    PE: Blood pressure 188/96, pulse 62, temperature 98.3 F (36.8 C), temperature source Temporal, height 5' 1.5" (1.562 m), weight 153 lb 12 oz (69.741 kg), SpO2  100.00%. Gen: Alert, well appearing.  Patient is oriented to person, place, time, and situation. ENT: Ears: EACs clear, normal epithelium.  TMs with good light reflex and landmarks bilaterally.  Eyes: no injection, icteris, swelling, or exudate.  EOMI, PERRLA. Nose: no drainage or turbinate edema/swelling.  No injection or focal lesion.  Mouth: lips without lesion/swelling.  Oral mucosa pink and moist.  Dentition intact and without obvious caries or gingival swelling.  Oropharynx without erythema, exudate, or swelling.  NECK: she has a mildly firm and mildly tender lymph node in right jugulodigastric region about the size of a nickel, moveable.  No other LAD palpable.  No thyromegaly. CV: RRR, no m/r/g.   LUNGS: CTA bilat, nonlabored resps, good aeration in all lung fields.  LAB:  Lab Results  Component Value Date   TSH 4.01 11/29/2011    IMPRESSION AND PLAN:  1) Cervical lymph node enlargement, isolated node: possibly cervical lymphadenitis (bacterial) possibly residual reactive node from her recent URI.  Will treat with 10d of keflex 500 mg tid and recheck her in 2 wks. If not better then will proceed with CBC and neck imaging.  2) Hypothyroidism, overdue for routine TSH monitoring: she does want this checked today so I ordered it.  An After Visit Summary was printed and given  to the patient.  FOLLOW UP: 2 wks with either Dr. Etter Sjogren or myself

## 2013-12-07 DIAGNOSIS — H40013 Open angle with borderline findings, low risk, bilateral: Secondary | ICD-10-CM | POA: Diagnosis not present

## 2013-12-07 DIAGNOSIS — H04123 Dry eye syndrome of bilateral lacrimal glands: Secondary | ICD-10-CM | POA: Diagnosis not present

## 2013-12-07 LAB — TSH: TSH: 7.03 u[IU]/mL — AB (ref 0.35–4.50)

## 2013-12-20 ENCOUNTER — Ambulatory Visit (INDEPENDENT_AMBULATORY_CARE_PROVIDER_SITE_OTHER): Payer: Medicare Other | Admitting: Family Medicine

## 2013-12-20 ENCOUNTER — Encounter: Payer: Self-pay | Admitting: Family Medicine

## 2013-12-20 VITALS — BP 153/84 | HR 67 | Temp 97.9°F | Resp 16 | Ht 61.5 in | Wt 155.0 lb

## 2013-12-20 DIAGNOSIS — I889 Nonspecific lymphadenitis, unspecified: Secondary | ICD-10-CM | POA: Diagnosis not present

## 2013-12-20 MED ORDER — LEVOTHYROXINE SODIUM 100 MCG PO TABS: ORAL_TABLET | ORAL | Status: DC

## 2013-12-20 NOTE — Progress Notes (Signed)
Pre visit review using our clinic review tool, if applicable. No additional management support is needed unless otherwise documented below in the visit note. 

## 2013-12-20 NOTE — Patient Instructions (Signed)
Approximately the last week in November 2015 make arrangements for lab visit at Dr. Nonda Lou for recheck thyroid test.

## 2013-12-20 NOTE — Progress Notes (Signed)
OFFICE NOTE  12/20/2013  CC:  Chief Complaint  Patient presents with  . Follow-up  . Shortness of Breath  . Fatigue     HPI: Patient is a 73 y.o. Caucasian female who is here for 2 wk f/u neck node. She is s/p a course of keflex. She has chronic hypothyroidism, recent TSH was at that visit was a little over 7 and her PCP increased her levoxyl dose appropriately. The node on her neck is quite a bit smaller to her.  No fevers, night sweats, or pain.  Pertinent PMH:  Past medical, surgical, social, and family history reviewed and no changes are noted since last office visit.  MEDS:  Outpatient Prescriptions Prior to Visit  Medication Sig Dispense Refill  . Ascorbic Acid (VITAMIN C CR) 1000 MG TBCR Take 1 each by mouth daily. EMERGEN-C PACKET      . Digestive Enzymes CHEW Chew 1 tablet by mouth 2 (two) times daily.      [name] levothyroxine (SYNTHROID, LEVOTHROID) 100 MCG tablet TAKE 1&1/2 TABLETS BY MOUTH DAILY ON WEDNESDAY AND 1 TABLET EVERY DAY ALL OTHER DAYS  96 tablet  0  . Multiple Vitamins-Minerals (ICAPS) TABS Take 2 tablets by mouth daily. AREDS 1+2      . cephALEXin (KEFLEX) 500 MG capsule Take 1 capsule (500 mg total) by mouth 3 (three) times daily.  30 capsule  0   No facility-administered medications prior to visit.    PE: Blood pressure 153/84, pulse 67, temperature 97.9 F (36.6 C), temperature source Temporal, resp. rate 16, height 5' 1.5" (1.562 m), weight 155 lb (70.308 kg), SpO2 95.00%. Gen: Alert, well appearing.  Patient is oriented to person, place, time, and situation. ENT: Ears: EACs clear, normal epithelium.  TMs with good light reflex and landmarks bilaterally.  Eyes: no injection, icteris, swelling, or exudate.  EOMI, PERRLA. Nose: no drainage or turbinate edema/swelling.  No injection or focal lesion.  Mouth: lips without lesion/swelling.  Oral mucosa pink and moist.  Dentition intact and without obvious caries or gingival swelling.  Oropharynx without  erythema, exudate, or swelling.  Neck - No masses or thyromegaly or limitation in range of motion  LAB:  Lab Results  Component Value Date   TSH 7.03* 12/06/2013   IMPRESSION AND PLAN:  1) Right jugulodigastric reactive LAD--resolved. Reassured.  2) Hypothyroidism; her dose was adjusted and she is due for TSH recheck the latter part of Nov or early December 2015.  An After Visit Summary was printed and given to the patient.  FOLLOW UP: prn

## 2013-12-29 ENCOUNTER — Ambulatory Visit (INDEPENDENT_AMBULATORY_CARE_PROVIDER_SITE_OTHER): Payer: Medicare Other | Admitting: Podiatrist

## 2013-12-29 ENCOUNTER — Encounter: Payer: Self-pay | Admitting: Podiatrist

## 2013-12-29 VITALS — BP 118/86 | HR 60 | Resp 12

## 2013-12-29 DIAGNOSIS — L84 Corns and callosities: Secondary | ICD-10-CM | POA: Diagnosis not present

## 2013-12-29 NOTE — Patient Instructions (Signed)
Corns and Calluses Corns are small areas of thickened skin that usually occur on the top, sides, or tip of a toe. They contain a cone-shaped core with a point that can press on a nerve below. This causes pain. Calluses are areas of thickened skin that usually develop on hands, fingers, palms, soles of the feet, and heels. These are areas that experience frequent friction or pressure. CAUSES  Corns are usually the result of rubbing (friction) or pressure from shoes that are too tight or do not fit properly. Calluses are caused by repeated friction and pressure on the affected areas. SYMPTOMS  A hard growth on the skin.  Pain or tenderness under the skin.  Sometimes, redness and swelling.  Increased discomfort while wearing tight-fitting shoes. DIAGNOSIS  Your caregiver can usually tell what the problem is by doing a physical exam. TREATMENT  Removing the cause of the friction or pressure is usually the only treatment needed. However, sometimes medicines can be used to help soften the hardened, thickened areas. These medicines include salicylic acid plasters and 12% ammonium lactate lotion. These medicines should only be used under the direction of your caregiver. HOME CARE INSTRUCTIONS   Try to remove pressure from the affected area.  You may wear donut-shaped corn pads to protect your skin.  You may use a pumice stone or nonmetallic nail file to gently reduce the thickness of a corn.  Wear properly fitted footwear.  If you have calluses on the hands, wear gloves during activities that cause friction.  If you have diabetes, you should regularly examine your feet. Tell your caregiver if you notice any problems with your feet. SEEK IMMEDIATE MEDICAL CARE IF:   You have increased pain, swelling, redness, or warmth in the affected area.  Your corn or callus starts to drain fluid or bleeds.  You are not getting better, even with treatment. Document Released: 11/25/2003 Document  Revised: 05/13/2011 Document Reviewed: 10/16/2010 ExitCare Patient Information 2015 [employer]. This information is not intended to replace advice given to you by your health care provider. Make sure you discuss any questions you have with your health care provider.  

## 2013-12-30 ENCOUNTER — Other Ambulatory Visit: Payer: Self-pay | Admitting: Family Medicine

## 2013-12-30 ENCOUNTER — Telehealth: Payer: Self-pay | Admitting: Family Medicine

## 2013-12-30 MED ORDER — BUPROPION HCL ER (XL) 150 MG PO TB24: ORAL_TABLET | ORAL | Status: DC

## 2013-12-30 NOTE — Telephone Encounter (Signed)
Detailed message advising Rx being faxed and to call and schedule a 1 mo follow up.      KP

## 2013-12-30 NOTE — Telephone Encounter (Signed)
You need to take wellbutrin daily for it to work---- wellbutrin xl 150 mg 1 po qam x 1 week then 2 po qd--- #60  Ov in 1 month--- to evaluate

## 2013-12-30 NOTE — Telephone Encounter (Signed)
Pt would like to know if dr. Etter Sjogren would write her an rx for wellbutrin. States she feels like she needs it now, she only takes it as needed. States she has not taken it in years. States if she wants her to come in she will but she has written it before without her having to come in. Send to [facility], [address].

## 2013-12-31 NOTE — Progress Notes (Signed)
Chief Complaint  Patient presents with  . Callouses    ''RT FOOT CALLUS IS GETTING THICKER AND SORE SOMETIMES.''     HPI: Patient is 73 y.o. female who presents today for continued pain on the plantar aspect of the right foot. She has a porokeratotic lesion which is painful and symptomatic. She recently went to [facility] and stated that she felt pain when walking. She would also like to know if there are any options for removing the lesion.  Physical Exam  Patient is awake, alert, and oriented x 3.  In no acute distress.  Vascular status is intact with palpable pedal pulses at 2/4 DP and PT bilateral and capillary refill time within normal limits. Neurological sensation is intact and unchanged. A well-circumscribed porokeratotic lesion is present some metatarsal 4 of the right foot. It is deeply nucleated with a groundglass appearance. Intact integument is present with debridement.  Assessment: Porokeratotic lesion right foot  Plan: Deep debridement was carried out today without complications. She will see how this does and if she would like to have the lesion cut out we will plan in office surgery to have this done. If any problems or concerns arise she will also call.

## 2014-01-03 ENCOUNTER — Encounter: Payer: Self-pay | Admitting: Podiatrist

## 2014-01-14 ENCOUNTER — Telehealth: Payer: Self-pay | Admitting: Family Medicine

## 2014-01-14 MED ORDER — LEVOTHYROXINE SODIUM 100 MCG PO TABS: ORAL_TABLET | ORAL | Status: DC

## 2014-01-14 NOTE — Telephone Encounter (Signed)
Caller name: Anysha Relation to pt: self Call back number: [phone] Pharmacy:  Reason for call:   Patient states that she is now using [facility] and need a refill of levothyroxine

## 2014-01-20 ENCOUNTER — Ambulatory Visit (INDEPENDENT_AMBULATORY_CARE_PROVIDER_SITE_OTHER): Payer: Medicare Other | Admitting: Family Medicine

## 2014-01-20 ENCOUNTER — Encounter: Payer: Self-pay | Admitting: Family Medicine

## 2014-01-20 VITALS — BP 159/84 | HR 68 | Temp 98.7°F | Wt 153.8 lb

## 2014-01-20 DIAGNOSIS — F32A Depression, unspecified: Secondary | ICD-10-CM

## 2014-01-20 DIAGNOSIS — Z23 Encounter for immunization: Secondary | ICD-10-CM | POA: Diagnosis not present

## 2014-01-20 DIAGNOSIS — G5602 Carpal tunnel syndrome, left upper limb: Secondary | ICD-10-CM | POA: Diagnosis not present

## 2014-01-20 DIAGNOSIS — E039 Hypothyroidism, unspecified: Secondary | ICD-10-CM

## 2014-01-20 DIAGNOSIS — F329 Major depressive disorder, single episode, unspecified: Secondary | ICD-10-CM

## 2014-01-20 MED ORDER — BUPROPION HCL ER (XL) 300 MG PO TB24: 300.0000 mg | ORAL_TABLET | Freq: Every day | ORAL | Status: DC

## 2014-01-20 NOTE — Patient Instructions (Signed)
Hypothyroidism The thyroid is a large gland located in the lower front of your neck. The thyroid gland helps control metabolism. Metabolism is how your body handles food. It controls metabolism with the hormone thyroxine. When this gland is underactive (hypothyroid), it produces too little hormone.  CAUSES These include:   Absence or destruction of thyroid tissue.  Goiter due to iodine deficiency.  Goiter due to medications.  Congenital defects (since birth).  Problems with the pituitary. This causes a lack of TSH (thyroid stimulating hormone). This hormone tells the thyroid to turn out more hormone. SYMPTOMS  Lethargy (feeling as though you have no energy)  Cold intolerance  Weight gain (in spite of normal food intake)  Dry skin  Coarse hair  Menstrual irregularity (if severe, may lead to infertility)  Slowing of thought processes Cardiac problems are also caused by insufficient amounts of thyroid hormone. Hypothyroidism in the newborn is cretinism, and is an extreme form. It is important that this form be treated adequately and immediately or it will lead rapidly to retarded physical and mental development. DIAGNOSIS  To prove hypothyroidism, your caregiver may do blood tests and ultrasound tests. Sometimes the signs are hidden. It may be necessary for your caregiver to watch this illness with blood tests either before or after diagnosis and treatment. TREATMENT  Low levels of thyroid hormone are increased by using synthetic thyroid hormone. This is a safe, effective treatment. It usually takes about four weeks to gain the full effects of the medication. After you have the full effect of the medication, it will generally take another four weeks for problems to leave. Your caregiver may start you on low doses. If you have had heart problems the dose may be gradually increased. It is generally not an emergency to get rapidly to normal. HOME CARE INSTRUCTIONS   Take your  medications as your caregiver suggests. Let your caregiver know of any medications you are taking or start taking. Your caregiver will help you with dosage schedules.  As your condition improves, your dosage needs may increase. It will be necessary to have continuing blood tests as suggested by your caregiver.  Report all suspected medication side effects to your caregiver. SEEK MEDICAL CARE IF: Seek medical care if you develop:  Sweating.  Tremulousness (tremors).  Anxiety.  Rapid weight loss.  Heat intolerance.  Emotional swings.  Diarrhea.  Weakness. SEEK IMMEDIATE MEDICAL CARE IF:  You develop chest pain, an irregular heart beat (palpitations), or a rapid heart beat. MAKE SURE YOU:   Understand these instructions.  Will watch your condition.  Will get help right away if you are not doing well or get worse. Document Released: 02/18/2005 Document Revised: 05/13/2011 Document Reviewed: 10/09/2007 ExitCare Patient Information 2015 ExitCare[employer]. This information is not intended to replace advice given to you by your health care provider. Make sure you discuss any questions you have with your health care provider.  

## 2014-01-20 NOTE — Progress Notes (Signed)
   Subjective:    Patient ID: Amanda [name], female    DOB: 06/13/1940, 74 y.o.   MRN: [id number]  HPI  Pt here f/u lymphadenopathy ---- resolved .  She is also c/o numbness in L hand. Worse during day.     Review of Systems As above     Objective:   Physical Exam  BP 159/84 mmHg  Pulse 68  Temp(Src) 98.7 F (37.1 C) (Oral)  Wt 153 lb 12.8 oz (69.763 kg)  SpO2 94% General appearance: alert, cooperative, appears stated age and no distress Throat: lips, mucosa, and tongue normal; teeth and gums normal Neck: no adenopathy, supple, symmetrical, trachea midline and thyroid not enlarged, symmetric, no tenderness/mass/nodules Lungs: clear to auscultation bilaterally Heart: S1, S2 normal Extremities: extremities normal, atraumatic, no cyanosis or edema      Assessment & Plan:  1. Hypothyroidism, unspecified hypothyroidism type Recheck labs next month - TSH; Future - T3, free; Future - T4, free; Future  2. Depression meds working well Refill wellbutrin - buPROPion (WELLBUTRIN XL) 300 MG 24 hr tablet; Take 1 tablet (300 mg total) by mouth daily.  Dispense: 90 tablet; Refill: 3  3. Carpal tunnel syndrome of left wrist Pt has splint at home she will try that first and let us know if hand surgeon referral is needed  4. Need for prophylactic vaccination and inoculation against influenza   - Flu vaccine HIGH DOSE PF (Fluzone Tri High dose)  5. Need for vaccination with 13-polyvalent pneumococcal conjugate vaccine   - Pneumococcal conjugate vaccine 13-valent IM 6 cervical adenopathy-- resolved

## 2014-01-20 NOTE — Progress Notes (Signed)
Pre visit review using our clinic review tool, if applicable. No additional management support is needed unless otherwise documented below in the visit note. 

## 2014-03-02 ENCOUNTER — Other Ambulatory Visit (INDEPENDENT_AMBULATORY_CARE_PROVIDER_SITE_OTHER): Payer: Medicare Other

## 2014-03-02 ENCOUNTER — Other Ambulatory Visit: Payer: Medicare Other

## 2014-03-02 DIAGNOSIS — E039 Hypothyroidism, unspecified: Secondary | ICD-10-CM | POA: Diagnosis not present

## 2014-03-02 LAB — T4, FREE: Free T4: 0.74 ng/dL (ref 0.60–1.60)

## 2014-03-02 LAB — TSH: TSH: 5.25 u[IU]/mL — ABNORMAL HIGH (ref 0.35–4.50)

## 2014-03-02 LAB — T3, FREE: T3, Free: 2.6 pg/mL (ref 2.3–4.2)

## 2014-03-03 ENCOUNTER — Other Ambulatory Visit: Payer: Self-pay

## 2014-03-03 ENCOUNTER — Other Ambulatory Visit: Payer: Self-pay | Admitting: Family Medicine

## 2014-03-03 MED ORDER — LEVOTHYROXINE SODIUM 112 MCG PO TABS: 112.0000 ug | ORAL_TABLET | Freq: Every day | ORAL | Status: DC

## 2014-03-28 ENCOUNTER — Other Ambulatory Visit: Payer: Self-pay

## 2014-03-28 DIAGNOSIS — Z1231 Encounter for screening mammogram for malignant neoplasm of breast: Secondary | ICD-10-CM

## 2014-04-04 ENCOUNTER — Other Ambulatory Visit (HOSPITAL_COMMUNITY): Payer: Self-pay | Admitting: Chiropractic Medicine

## 2014-04-04 ENCOUNTER — Ambulatory Visit (HOSPITAL_COMMUNITY)
Admission: RE | Admit: 2014-04-04 | Discharge: 2014-04-04 | Disposition: A | Discharge status: Home / Self Care | Payer: Medicare Other | Source: Ambulatory Visit | Attending: Chiropractic Medicine | Admitting: Chiropractic Medicine

## 2014-04-04 DIAGNOSIS — M542 Cervicalgia: Secondary | ICD-10-CM | POA: Diagnosis not present

## 2014-04-04 DIAGNOSIS — M5032 Other cervical disc degeneration, mid-cervical region: Secondary | ICD-10-CM | POA: Diagnosis not present

## 2014-04-04 DIAGNOSIS — M9901 Segmental and somatic dysfunction of cervical region: Secondary | ICD-10-CM | POA: Diagnosis not present

## 2014-04-07 DIAGNOSIS — M9901 Segmental and somatic dysfunction of cervical region: Secondary | ICD-10-CM | POA: Diagnosis not present

## 2014-04-07 DIAGNOSIS — M542 Cervicalgia: Secondary | ICD-10-CM | POA: Diagnosis not present

## 2014-04-11 DIAGNOSIS — H35033 Hypertensive retinopathy, bilateral: Secondary | ICD-10-CM | POA: Diagnosis not present

## 2014-04-11 DIAGNOSIS — H524 Presbyopia: Secondary | ICD-10-CM | POA: Diagnosis not present

## 2014-04-11 DIAGNOSIS — H04123 Dry eye syndrome of bilateral lacrimal glands: Secondary | ICD-10-CM | POA: Diagnosis not present

## 2014-04-11 DIAGNOSIS — H40013 Open angle with borderline findings, low risk, bilateral: Secondary | ICD-10-CM | POA: Diagnosis not present

## 2014-04-12 DIAGNOSIS — M9901 Segmental and somatic dysfunction of cervical region: Secondary | ICD-10-CM | POA: Diagnosis not present

## 2014-04-12 DIAGNOSIS — M542 Cervicalgia: Secondary | ICD-10-CM | POA: Diagnosis not present

## 2014-04-19 ENCOUNTER — Ambulatory Visit
Admission: RE | Admit: 2014-04-19 | Discharge: 2014-04-19 | Disposition: A | Discharge status: Home / Self Care | Payer: Medicare Other | Source: Ambulatory Visit

## 2014-04-19 DIAGNOSIS — Z1231 Encounter for screening mammogram for malignant neoplasm of breast: Secondary | ICD-10-CM | POA: Diagnosis not present

## 2014-04-22 ENCOUNTER — Telehealth: Payer: Self-pay | Admitting: Family Medicine

## 2014-04-22 NOTE — Telephone Encounter (Signed)
Spoke with patient and she stated that she had not started the 112 mcg of because she had the 90 day supply of Levothyroxine. The medication has been sent to the pharmacy and the a lab apt has been scheduled.    KP

## 2014-04-22 NOTE — Telephone Encounter (Signed)
Caller name: Sherece Relation to pt: self Call back number: [phone] [facility]acy: [facility]  Reason for call:   Patient states that she recently had labs done and thought that levothyroxine needs to be increased?? Needs refill

## 2014-04-22 NOTE — Telephone Encounter (Signed)
Patient states that she just talked to you and has additional questions regarding med

## 2014-04-25 NOTE — Telephone Encounter (Signed)
Patient never started the 112 mcg and does not want to have labs done. Please advise     KP

## 2014-04-25 NOTE — Telephone Encounter (Signed)
Why didn't she take it--- she needs to start  Recheck labs 2 months

## 2014-04-25 NOTE — Telephone Encounter (Signed)
Amanda [name] from [facility] states that she did not receive med refill for 141mcg. Please resend

## 2014-04-25 NOTE — Telephone Encounter (Signed)
Spoke with the patient and she verbalized understanding, apt has been scheduled for April.      KP

## 2014-04-25 NOTE — Telephone Encounter (Signed)
Please call patient. She states that she has not been taking this medication and feels like she does not need labs. Best # [phone]

## 2014-04-26 ENCOUNTER — Other Ambulatory Visit: Payer: Medicare Other

## 2014-06-02 ENCOUNTER — Ambulatory Visit (INDEPENDENT_AMBULATORY_CARE_PROVIDER_SITE_OTHER): Payer: Medicare Other | Admitting: Gynecology

## 2014-06-02 ENCOUNTER — Encounter: Payer: Self-pay | Admitting: Gynecology

## 2014-06-02 VITALS — BP 134/86 | Ht 61.75 in | Wt 154.0 lb

## 2014-06-02 DIAGNOSIS — Z01419 Encounter for gynecological examination (general) (routine) without abnormal findings: Secondary | ICD-10-CM

## 2014-06-02 DIAGNOSIS — Z1321 Encounter for screening for nutritional disorder: Secondary | ICD-10-CM | POA: Diagnosis not present

## 2014-06-02 DIAGNOSIS — Z853 Personal history of malignant neoplasm of breast: Secondary | ICD-10-CM | POA: Diagnosis not present

## 2014-06-02 DIAGNOSIS — Z8041 Family history of malignant neoplasm of ovary: Secondary | ICD-10-CM | POA: Diagnosis not present

## 2014-06-02 DIAGNOSIS — N952 Postmenopausal atrophic vaginitis: Secondary | ICD-10-CM | POA: Diagnosis not present

## 2014-06-02 DIAGNOSIS — M8580 Other specified disorders of bone density and structure, unspecified site: Secondary | ICD-10-CM

## 2014-06-02 NOTE — Progress Notes (Signed)
Amanda [name] 04-06-1940 [id number]   History:    74 y.o.  for annual gyn exam with no complaints today.Patient with history of osteopenia and her records indicated that she came off the Fosamax in 2010 and has been on a drug free holiday. Her last bone density study was done here in our office last year and her lowest T score -1.5 at the left femoral neck with significant decrease in bone mineralization of the  femoral neck when compared with 2 years prior. Her PCP has been doing her blood work. Patient reports normal colonoscopy in 2014.  Patient's mother had breast cancer at the age of 30 and later at age 77 developed ovarian cancer. Patient previously advised and counseled to proceed with BRCA1 and BRCA2 gene mutation screening as well as consultation with geneticist the patient has declined. Patient with no past history of any abnormal Pap smears.   Past medical history,surgical history, family history and social history were all reviewed and documented in the EPIC chart.  Gynecologic History No LMP recorded. Patient is postmenopausal. Contraception: post menopausal status Last Pap: 2013. Results were: normal Last mammogram: 2016. Results were: normal  Obstetric History OB History  Gravida Para Term Preterm AB SAB TAB Ectopic Multiple Living  '3 3  2      2    ' # Outcome Date GA Lbr Len/2nd Weight Sex Delivery Anes PTL Lv  3 26     M CS-Unspec   FD  2 Preterm     M Vag-Spont  Y Y  1 Preterm     F Vag-Spont  N Y       ROS: A ROS was performed and pertinent positives and negatives are included in the history.  GENERAL: No fevers or chills. HEENT: No change in vision, no earache, sore throat or sinus congestion. NECK: No pain or stiffness. CARDIOVASCULAR: No chest pain or pressure. No palpitations. PULMONARY: No shortness of breath, cough or wheeze. GASTROINTESTINAL: No abdominal pain, nausea, vomiting or diarrhea, melena or bright red blood per rectum. GENITOURINARY: No urinary  frequency, urgency, hesitancy or dysuria. MUSCULOSKELETAL: No joint or muscle pain, no back pain, no recent trauma. DERMATOLOGIC: No rash, no itching, no lesions. ENDOCRINE: No polyuria, polydipsia, no heat or cold intolerance. No recent change in weight. HEMATOLOGICAL: No anemia or easy bruising or bleeding. NEUROLOGIC: No headache, seizures, numbness, tingling or weakness. PSYCHIATRIC: No depression, no loss of interest in normal activity or change in sleep pattern.     Exam: chaperone present  BP 134/86 mmHg  Ht 5' 1.75" (1.568 m)  Wt 154 lb (69.854 kg)  BMI 28.41 kg/m2  Body mass index is 28.41 kg/(m^2).  General appearance : Well developed well nourished female. No acute distress HEENT: Eyes: no retinal hemorrhage or exudates,  Neck supple, trachea midline, no carotid bruits, no thyroidmegaly Lungs: Clear to auscultation, no rhonchi or wheezes, or rib retractions  Heart: Regular rate and rhythm, no murmurs or gallops Breast:Examined in sitting and supine position were symmetrical in appearance, no palpable masses or tenderness,  no skin retraction, no nipple inversion, no nipple discharge, no skin discoloration, no axillary or supraclavicular lymphadenopathy Abdomen: no palpable masses or tenderness, no rebound or guarding Extremities: no edema or skin discoloration or tenderness  Pelvic:  Bartholin, Urethra, Skene Glands: Within normal limits             Vagina: No gross lesions or discharge, atrophic changes  Cervix: No gross lesions or discharge  Uterus  anteverted, normal size, shape and consistency, non-tender and mobile  Adnexa  Without masses or tenderness  Anus and perineum  normal   Rectovaginal  normal sphincter tone without palpated masses or tenderness             Hemoccult PCP provides     Assessment/Plan:  74 y.o. female for annual exam with osteopenia was encouraged to continue to take her calcium and vitamin D and regular exercise with weightbearing type. Pap  smear no longer needed according to the new guidelines. Because of her decreased bone mineralization of the right hip and history of osteopenia we will check a vitamin D level today. She was reminded to do her monthly breast exams. She will return back to the office in the next few weeks for ultrasound screening of her ovaries because of mother's history of breast and ovarian cancer.   Terrance Mass MD, 3:05 PM 06/02/2014

## 2014-06-03 LAB — VITAMIN D 25 HYDROXY (VIT D DEFICIENCY, FRACTURES): Vit D, 25-Hydroxy: 32 ng/mL (ref 30–100)

## 2014-06-09 ENCOUNTER — Other Ambulatory Visit: Payer: Self-pay

## 2014-06-09 DIAGNOSIS — E079 Disorder of thyroid, unspecified: Secondary | ICD-10-CM

## 2014-06-22 ENCOUNTER — Ambulatory Visit: Payer: BLUE CROSS/BLUE SHIELD | Admitting: Gynecology

## 2014-06-22 ENCOUNTER — Other Ambulatory Visit: Payer: Medicare Other

## 2014-06-22 ENCOUNTER — Other Ambulatory Visit (INDEPENDENT_AMBULATORY_CARE_PROVIDER_SITE_OTHER): Payer: Medicare Other

## 2014-06-22 DIAGNOSIS — E079 Disorder of thyroid, unspecified: Secondary | ICD-10-CM | POA: Diagnosis not present

## 2014-06-22 LAB — TSH: TSH: 4.82 u[IU]/mL — ABNORMAL HIGH (ref 0.35–4.50)

## 2014-06-24 ENCOUNTER — Other Ambulatory Visit: Payer: Self-pay

## 2014-06-24 MED ORDER — LEVOTHYROXINE SODIUM 125 MCG PO TABS: 125.0000 ug | ORAL_TABLET | Freq: Every day | ORAL | Status: DC

## 2014-06-29 ENCOUNTER — Encounter: Payer: Self-pay | Admitting: Gynecology

## 2014-06-29 ENCOUNTER — Telehealth: Payer: Self-pay | Admitting: *Deleted

## 2014-06-29 ENCOUNTER — Telehealth: Payer: Self-pay

## 2014-06-29 ENCOUNTER — Other Ambulatory Visit: Payer: Self-pay | Admitting: Gynecology

## 2014-06-29 ENCOUNTER — Ambulatory Visit (INDEPENDENT_AMBULATORY_CARE_PROVIDER_SITE_OTHER): Payer: Medicare Other | Admitting: Gynecology

## 2014-06-29 ENCOUNTER — Ambulatory Visit (INDEPENDENT_AMBULATORY_CARE_PROVIDER_SITE_OTHER): Payer: Medicare Other

## 2014-06-29 VITALS — BP 130/88 | Temp 103.0°F

## 2014-06-29 DIAGNOSIS — Z853 Personal history of malignant neoplasm of breast: Secondary | ICD-10-CM | POA: Diagnosis not present

## 2014-06-29 DIAGNOSIS — N3000 Acute cystitis without hematuria: Secondary | ICD-10-CM

## 2014-06-29 DIAGNOSIS — A499 Bacterial infection, unspecified: Secondary | ICD-10-CM

## 2014-06-29 DIAGNOSIS — Z8041 Family history of malignant neoplasm of ovary: Secondary | ICD-10-CM

## 2014-06-29 DIAGNOSIS — N760 Acute vaginitis: Secondary | ICD-10-CM | POA: Diagnosis not present

## 2014-06-29 DIAGNOSIS — B3741 Candidal cystitis and urethritis: Secondary | ICD-10-CM

## 2014-06-29 DIAGNOSIS — N898 Other specified noninflammatory disorders of vagina: Secondary | ICD-10-CM

## 2014-06-29 DIAGNOSIS — R102 Pelvic and perineal pain: Secondary | ICD-10-CM | POA: Diagnosis not present

## 2014-06-29 DIAGNOSIS — B9689 Other specified bacterial agents as the cause of diseases classified elsewhere: Secondary | ICD-10-CM

## 2014-06-29 LAB — URINALYSIS W MICROSCOPIC + REFLEX CULTURE
BILIRUBIN URINE: NEGATIVE
CASTS: NONE SEEN
CRYSTALS: NONE SEEN
Glucose, UA: NEGATIVE mg/dL
Ketones, ur: NEGATIVE mg/dL
Nitrite: NEGATIVE
PH: 7 (ref 5.0–8.0)
Protein, ur: 30 mg/dL — AB
SPECIFIC GRAVITY, URINE: 1.015 (ref 1.005–1.030)
Urobilinogen, UA: 1 mg/dL (ref 0.0–1.0)

## 2014-06-29 LAB — WET PREP FOR TRICH, YEAST, CLUE
Clue Cells Wet Prep HPF POC: NONE SEEN
Trich, Wet Prep: NONE SEEN

## 2014-06-29 MED ORDER — HYLAFEM VA SUPP: 3.0000 | Freq: Every day | VAGINAL | Status: DC

## 2014-06-29 MED ORDER — AMPICILLIN 250 MG PO CAPS: ORAL_CAPSULE | ORAL | Status: DC

## 2014-06-29 NOTE — Telephone Encounter (Signed)
Pt was seen today and said Rx for a cream was going to be sent to pharmacy? I didn't see mention of cream in note. Please advise

## 2014-06-29 NOTE — Patient Instructions (Signed)
Monilial Vaginitis Vaginitis in a soreness, swelling and redness (inflammation) of the vagina and vulva. Monilial vaginitis is not a sexually transmitted infection. CAUSES  Yeast vaginitis is caused by yeast (candida) that is normally found in your vagina. With a yeast infection, the candida has overgrown in number to a point that upsets the chemical balance. SYMPTOMS   White, thick vaginal discharge.  Swelling, itching, redness and irritation of the vagina and possibly the lips of the vagina (vulva).  Burning or painful urination.  Painful intercourse. DIAGNOSIS  Things that may contribute to monilial vaginitis are:  Postmenopausal and virginal states.  Pregnancy.  Infections.  Being tired, sick or stressed, especially if you had monilial vaginitis in the past.  Diabetes. Good control will help lower the chance.  Birth control pills.  Tight fitting garments.  Using bubble bath, feminine sprays, douches or deodorant tampons.  Taking certain medications that kill germs (antibiotics).  Sporadic recurrence can occur if you become ill. TREATMENT  Your caregiver will give you medication.  There are several kinds of anti monilial vaginal creams and suppositories specific for monilial vaginitis. For recurrent yeast infections, use a suppository or cream in the vagina 2 times a week, or as directed.  Anti-monilial or steroid cream for the itching or irritation of the vulva may also be used. Get your caregiver's permission.  Painting the vagina with methylene blue solution may help if the monilial cream does not work.  Eating yogurt may help prevent monilial vaginitis. HOME CARE INSTRUCTIONS   Finish all medication as prescribed.  Do not have sex until treatment is completed or after your caregiver tells you it is okay.  Take warm sitz baths.  Do not douche.  Do not use tampons, especially scented ones.  Wear cotton underwear.  Avoid tight pants and panty  hose.  Tell your sexual partner that you have a yeast infection. They should go to their caregiver if they have symptoms such as mild rash or itching.  Your sexual partner should be treated as well if your infection is difficult to eliminate.  Practice safer sex. Use condoms.  Some vaginal medications cause latex condoms to fail. Vaginal medications that harm condoms are:  Cleocin cream.  Butoconazole (Femstat).  Terconazole (Terazol) vaginal suppository.  Miconazole (Monistat) (may be purchased over the counter). SEEK MEDICAL CARE IF:   You have a temperature by mouth above 102 F (38.9 C).  The infection is getting worse after 2 days of treatment.  The infection is not getting better after 3 days of treatment.  You develop blisters in or around your vagina.  You develop vaginal bleeding, and it is not your menstrual period.  You have pain when you urinate.  You develop intestinal problems.  You have pain with sexual intercourse. Document Released: 11/28/2004 Document Revised: 05/13/2011 Document Reviewed: 08/12/2008 Exit[employer] Patient Information 2015 [employer]. This information is not intended to replace advice given to you by your health care provider. Make sure you discuss any questions you have with your health care provider.  

## 2014-06-29 NOTE — Telephone Encounter (Signed)
It was Hylafem vaginal suppository qhs x 3 nights not cream. I saw order is in. I also called in Ampicillin 250 mg TID for 7 days for UTi

## 2014-06-29 NOTE — Telephone Encounter (Signed)
Patient's husband called.  He said Dr. Moshe Salisbury approached them at elevator as leaving and told him patient with UTI.  He assumed he sent her Rx to mail order pharmacy but as she is very sick he wants to make sure it went to local pharmacy.  I redirected it to local and called the mail order. Mail order said it will not be received in their system for 24-48 hours. She said if they fill it locally that when they go to process it it will deny them filling it/paying for it.  She told me to call back tomorrow to cancel order from patient's profile.

## 2014-06-29 NOTE — Addendum Note (Signed)
Addended by: Thamas Jaegers on: 06/29/2014 03:10 PM   Modules accepted: Orders

## 2014-06-29 NOTE — Progress Notes (Signed)
   Patient is a 74 year old that presented to the office today to discuss her ultrasound that was ordered at time of her annual exam last month as a result of family history of ovarian cancer. She had flulike symptoms today and a temperature 103 and some mild body aches and postnasal congestion. Patient denied any coughing, nausea or vomiting. Patient also is complaining of some frequency and slight vaginal discharge.  Exam: Temperature 103 Gen. appearance patient with the above-mentioned complaints HEENT: Unremarkable Lungs: Clear to auscultation Rogers or wheezes Heart regular rate and rhythm no murmurs or gallops Abdomen: Soft nontender no rebound or guarding Pelvic exam: Bartholin urethra Skene glands atrophic changes Vagina: Creamy-like white discharge noted Cervix: No lesions or discharge Bimanual exam: Not done Rectal exam: Not done  Ultrasound: Uterus measures 6.3 x 5.1 x 3.2 cm endometrial stripe 1.4 mm. Some calcifications were noted the wall of the uterus otherwise normal. Right left ovary not seen no apparent adnexal masses no fluid in the cul-de-sac  Wet prep: Few yeast, too numerous to count WBC, moderate bacteria  Urinalysis: too numerous to count white blood cells and few bacteria  Assessment/plan: #1 flulike symptoms patient was encouraged to keep up with her fluids especially electrolyte such as with Gatorade. Tylenol 1-2 tablets every 6-8 hours when necessary no improvement to return to the office the next 72 hours or symptoms worsen. She should report to the ER if after-hours and no improvement in her symptoms. #2 yeast vaginitis with possible BV component a prescription for Hylafem vaginal suppository to apply daily at bedtime for 3 days was prescribed. #3 for her urinary tract infection she will be started on ampicillin 250 mg 3 times a day for 7 days. #4 normal ultrasound

## 2014-06-29 NOTE — Telephone Encounter (Signed)
Pt informed with below note, rx sent. 

## 2014-06-30 ENCOUNTER — Other Ambulatory Visit: Payer: Self-pay

## 2014-07-01 LAB — URINE CULTURE: Colony Count: 85000

## 2014-08-25 ENCOUNTER — Other Ambulatory Visit: Payer: Self-pay | Admitting: Family Medicine

## 2014-10-05 DIAGNOSIS — L570 Actinic keratosis: Secondary | ICD-10-CM | POA: Diagnosis not present

## 2014-10-05 DIAGNOSIS — D2272 Melanocytic nevi of left lower limb, including hip: Secondary | ICD-10-CM | POA: Diagnosis not present

## 2014-10-05 DIAGNOSIS — Z85828 Personal history of other malignant neoplasm of skin: Secondary | ICD-10-CM | POA: Diagnosis not present

## 2014-10-05 DIAGNOSIS — Z86018 Personal history of other benign neoplasm: Secondary | ICD-10-CM | POA: Diagnosis not present

## 2014-11-15 ENCOUNTER — Telehealth: Payer: Self-pay | Admitting: Family Medicine

## 2014-11-15 ENCOUNTER — Emergency Department (HOSPITAL_BASED_OUTPATIENT_CLINIC_OR_DEPARTMENT_OTHER)
Admission: EM | Admit: 2014-11-15 | Discharge: 2014-11-15 | Disposition: A | Discharge status: Home / Self Care | Payer: Medicare Other | Source: Home / Self Care | Attending: Emergency Medicine | Admitting: Emergency Medicine

## 2014-11-15 ENCOUNTER — Emergency Department (HOSPITAL_BASED_OUTPATIENT_CLINIC_OR_DEPARTMENT_OTHER): Payer: Medicare Other

## 2014-11-15 ENCOUNTER — Encounter (HOSPITAL_BASED_OUTPATIENT_CLINIC_OR_DEPARTMENT_OTHER): Payer: Self-pay | Admitting: *Deleted

## 2014-11-15 DIAGNOSIS — D638 Anemia in other chronic diseases classified elsewhere: Secondary | ICD-10-CM | POA: Diagnosis not present

## 2014-11-15 DIAGNOSIS — J9601 Acute respiratory failure with hypoxia: Secondary | ICD-10-CM | POA: Diagnosis not present

## 2014-11-15 DIAGNOSIS — A481 Legionnaires' disease: Secondary | ICD-10-CM | POA: Diagnosis not present

## 2014-11-15 DIAGNOSIS — I509 Heart failure, unspecified: Secondary | ICD-10-CM | POA: Diagnosis not present

## 2014-11-15 DIAGNOSIS — J189 Pneumonia, unspecified organism: Secondary | ICD-10-CM | POA: Diagnosis not present

## 2014-11-15 DIAGNOSIS — R509 Fever, unspecified: Secondary | ICD-10-CM | POA: Diagnosis not present

## 2014-11-15 DIAGNOSIS — N390 Urinary tract infection, site not specified: Secondary | ICD-10-CM | POA: Diagnosis not present

## 2014-11-15 DIAGNOSIS — R05 Cough: Secondary | ICD-10-CM | POA: Diagnosis not present

## 2014-11-15 DIAGNOSIS — E860 Dehydration: Secondary | ICD-10-CM | POA: Diagnosis not present

## 2014-11-15 DIAGNOSIS — E871 Hypo-osmolality and hyponatremia: Secondary | ICD-10-CM | POA: Diagnosis not present

## 2014-11-15 HISTORY — DX: Pneumonia, unspecified organism: J18.9

## 2014-11-15 LAB — CBC WITH DIFFERENTIAL/PLATELET
Basophils Absolute: 0 10*3/uL (ref 0.0–0.1)
Basophils Relative: 0 % (ref 0–1)
EOS ABS: 0 10*3/uL (ref 0.0–0.7)
Eosinophils Relative: 0 % (ref 0–5)
HCT: 35.1 % — ABNORMAL LOW (ref 36.0–46.0)
HEMOGLOBIN: 11.7 g/dL — AB (ref 12.0–15.0)
LYMPHS ABS: 1.2 10*3/uL (ref 0.7–4.0)
Lymphocytes Relative: 13 % (ref 12–46)
MCH: 27.3 pg (ref 26.0–34.0)
MCHC: 33.3 g/dL (ref 30.0–36.0)
MCV: 81.8 fL (ref 78.0–100.0)
MONO ABS: 1.2 10*3/uL — AB (ref 0.1–1.0)
MONOS PCT: 12 % (ref 3–12)
NEUTROS PCT: 75 % (ref 43–77)
Neutro Abs: 7.4 10*3/uL (ref 1.7–7.7)
Platelets: 212 10*3/uL (ref 150–400)
RBC: 4.29 MIL/uL (ref 3.87–5.11)
RDW: 13.4 % (ref 11.5–15.5)
WBC: 9.8 10*3/uL (ref 4.0–10.5)

## 2014-11-15 LAB — URINALYSIS, ROUTINE W REFLEX MICROSCOPIC
Bilirubin Urine: NEGATIVE
GLUCOSE, UA: NEGATIVE mg/dL
Hgb urine dipstick: NEGATIVE
KETONES UR: 15 mg/dL — AB
Nitrite: NEGATIVE
PROTEIN: 100 mg/dL — AB
Specific Gravity, Urine: 1.025 (ref 1.005–1.030)
Urobilinogen, UA: 1 mg/dL (ref 0.0–1.0)
pH: 5.5 (ref 5.0–8.0)

## 2014-11-15 LAB — URINE MICROSCOPIC-ADD ON

## 2014-11-15 LAB — COMPREHENSIVE METABOLIC PANEL
ALT: 17 U/L (ref 14–54)
AST: 23 U/L (ref 15–41)
Albumin: 3.5 g/dL (ref 3.5–5.0)
Alkaline Phosphatase: 72 U/L (ref 38–126)
Anion gap: 11 (ref 5–15)
BUN: 17 mg/dL (ref 6–20)
CHLORIDE: 95 mmol/L — AB (ref 101–111)
CO2: 25 mmol/L (ref 22–32)
Calcium: 8.4 mg/dL — ABNORMAL LOW (ref 8.9–10.3)
Creatinine, Ser: 1.22 mg/dL — ABNORMAL HIGH (ref 0.44–1.00)
GFR calc Af Amer: 49 mL/min — ABNORMAL LOW (ref >60)
GFR, EST NON AFRICAN AMERICAN: 42 mL/min — AB (ref >60)
Glucose, Bld: 128 mg/dL — ABNORMAL HIGH (ref 65–99)
Potassium: 3.6 mmol/L (ref 3.5–5.1)
SODIUM: 131 mmol/L — AB (ref 135–145)
Total Bilirubin: 0.6 mg/dL (ref 0.3–1.2)
Total Protein: 6.8 g/dL (ref 6.5–8.1)

## 2014-11-15 LAB — LIPASE, BLOOD: LIPASE: 13 U/L — AB (ref 22–51)

## 2014-11-15 LAB — I-STAT CG4 LACTIC ACID, ED: Lactic Acid, Venous: 0.67 mmol/L (ref 0.5–2.0)

## 2014-11-15 MED ORDER — SODIUM CHLORIDE 0.9 % IV BOLUS (SEPSIS): 500.0000 mL | Freq: Once | INTRAVENOUS | Status: DC

## 2014-11-15 MED ORDER — DEXTROSE 5 % IV SOLN
1.0000 g | Freq: Once | INTRAVENOUS | Status: AC
  Administered 2014-11-15: 1 g via INTRAVENOUS

## 2014-11-15 MED ORDER — ONDANSETRON HCL 4 MG PO TABS: 4.0000 mg | ORAL_TABLET | Freq: Three times a day (TID) | ORAL | Status: DC | PRN

## 2014-11-15 MED ORDER — CEFTRIAXONE SODIUM 1 G IJ SOLR
INTRAMUSCULAR | Status: AC
  Filled 2014-11-15: qty 10

## 2014-11-15 MED ORDER — ACETAMINOPHEN 325 MG PO TABS
650.0000 mg | ORAL_TABLET | Freq: Once | ORAL | Status: AC
  Administered 2014-11-15: 650 mg via ORAL
  Filled 2014-11-15: qty 2

## 2014-11-15 MED ORDER — ONDANSETRON HCL 4 MG/2ML IJ SOLN
4.0000 mg | Freq: Once | INTRAMUSCULAR | Status: AC
  Administered 2014-11-15: 4 mg via INTRAVENOUS
  Filled 2014-11-15: qty 2

## 2014-11-15 MED ORDER — CEPHALEXIN 500 MG PO CAPS: 500.0000 mg | ORAL_CAPSULE | Freq: Four times a day (QID) | ORAL | Status: DC

## 2014-11-15 MED ORDER — SODIUM CHLORIDE 0.9 % IV BOLUS (SEPSIS)
1000.0000 mL | Freq: Once | INTRAVENOUS | Status: AC
  Administered 2014-11-15: 1000 mL via INTRAVENOUS

## 2014-11-15 NOTE — Discharge Instructions (Signed)
Fever, Adult A fever is a higher than normal body temperature. In an adult, an oral temperature around 98.6 F (37 C) is considered normal. A temperature of 100.4 F (38 C) or higher is generally considered a fever. Mild or moderate fevers generally have no long-term effects and often do not require treatment. Extreme fever (greater than or equal to 106 F or 41.1 C) can cause seizures. The sweating that may occur with repeated or prolonged fever may cause dehydration. Elderly people can develop confusion during a fever. A measured temperature can vary with:  Age.  Time of day.  Method of measurement (mouth, underarm, rectal, or ear). The fever is confirmed by taking a temperature with a thermometer. Temperatures can be taken different ways. Some methods are accurate and some are not.  An oral temperature is used most commonly. Electronic thermometers are fast and accurate.  An ear temperature will only be accurate if the thermometer is positioned as recommended by the manufacturer.  A rectal temperature is accurate and done for those adults who have a condition where an oral temperature cannot be taken.  An underarm (axillary) temperature is not accurate and not recommended. Fever is a symptom, not a disease.  CAUSES   Infections commonly cause fever.  Some noninfectious causes for fever include:  Some arthritis conditions.  Some thyroid or adrenal gland conditions.  Some immune system conditions.  Some types of cancer.  A medicine reaction.  High doses of certain street drugs such as methamphetamine.  Dehydration.  Exposure to high outside or room temperatures.  Occasionally, the source of a fever cannot be determined. This is sometimes called a "fever of unknown origin" (FUO).  Some situations may lead to a temporary rise in body temperature that may go away on its own. Examples are:  Childbirth.  Surgery.  Intense exercise. HOME CARE INSTRUCTIONS   Take  appropriate medicines for fever. Follow dosing instructions carefully. If you use acetaminophen to reduce the fever, be careful to avoid taking other medicines that also contain acetaminophen. Do not take aspirin for a fever if you are younger than age 67. There is an association with Reye's syndrome. Reye's syndrome is a rare but potentially deadly disease.  If an infection is present and antibiotics have been prescribed, take them as directed. Finish them even if you start to feel better.  Rest as needed.  Maintain an adequate fluid intake. To prevent dehydration during an illness with prolonged or recurrent fever, you may need to drink extra fluid.Drink enough fluids to keep your urine clear or pale yellow.  Sponging or bathing with room temperature water may help reduce body temperature. Do not use ice water or alcohol sponge baths.  Dress comfortably, but do not over-bundle. SEEK MEDICAL CARE IF:   You are unable to keep fluids down.  You develop vomiting or diarrhea.  You are not feeling at least partly better after 3 days.  You develop new symptoms or problems. SEEK IMMEDIATE MEDICAL CARE IF:   You have shortness of breath or trouble breathing.  You develop excessive weakness.  You are dizzy or you faint.  You are extremely thirsty or you are making little or no urine.  You develop new pain that was not there before (such as in the head, neck, chest, back, or abdomen).  You have persistent vomiting and diarrhea for more than 1 to 2 days.  You develop a stiff neck or your eyes become sensitive to light.  You develop a  skin rash.  You have a fever or persistent symptoms for more than 2 to 3 days.  You have a fever and your symptoms suddenly get worse. MAKE SURE YOU:   Understand these instructions.  Will watch your condition.  Will get help right away if you are not doing well or get worse. Document Released: 08/14/2000 Document Revised: 07/05/2013 Document  Reviewed: 12/20/2010 [facility] Patient Information 2015 [place], [place]. This information is not intended to replace advice given to you by your health care provider. Make sure you discuss any questions you have with your health care provider.  Urinary Tract Infection - possible Urinary tract infections (UTIs) can develop anywhere along your urinary tract. Your urinary tract is your body's drainage system for removing wastes and extra water. Your urinary tract includes two kidneys, two ureters, a bladder, and a urethra. Your kidneys are a pair of bean-shaped organs. Each kidney is about the size of your fist. They are located below your ribs, one on each side of your spine. CAUSES Infections are caused by microbes, which are microscopic organisms, including fungi, viruses, and bacteria. These organisms are so small that they can only be seen through a microscope. Bacteria are the microbes that most commonly cause UTIs. SYMPTOMS  Symptoms of UTIs may vary by age and gender of the patient and by the location of the infection. Symptoms in young women typically include a frequent and intense urge to urinate and a painful, burning feeling in the bladder or urethra during urination. Older women and men are more likely to be tired, shaky, and weak and have muscle aches and abdominal pain. A fever may mean the infection is in your kidneys. Other symptoms of a kidney infection include pain in your back or sides below the ribs, nausea, and vomiting. DIAGNOSIS To diagnose a UTI, your caregiver will ask you about your symptoms. Your caregiver also will ask to provide a urine sample. The urine sample will be tested for bacteria and white blood cells. White blood cells are made by your body to help fight infection. TREATMENT  Typically, UTIs can be treated with medication. Because most UTIs are caused by a bacterial infection, they usually can be treated with the use of antibiotics. The choice of antibiotic and length  of treatment depend on your symptoms and the type of bacteria causing your infection. HOME CARE INSTRUCTIONS  If you were prescribed antibiotics, take them exactly as your caregiver instructs you. Finish the medication even if you feel better after you have only taken some of the medication.  Drink enough water and fluids to keep your urine clear or pale yellow.  Avoid caffeine, tea, and carbonated beverages. They tend to irritate your bladder.  Empty your bladder often. Avoid holding urine for long periods of time.  Empty your bladder before and after sexual intercourse.  After a bowel movement, women should cleanse from front to back. Use each tissue only once. SEEK MEDICAL CARE IF:   You have back pain.  You develop a fever.  Your symptoms do not begin to resolve within 3 days. SEEK IMMEDIATE MEDICAL CARE IF:   You have severe back pain or lower abdominal pain.  You develop chills.  You have nausea or vomiting.  You have continued burning or discomfort with urination. MAKE SURE YOU:   Understand these instructions.  Will watch your condition.  Will get help right away if you are not doing well or get worse. Document Released: 11/28/2004 Document Revised: 08/20/2011 Document  [employer]® Patient Information ©2015 [employer]. This information is not intended to replace advice given to you by your health care provider. Make sure you discuss any questions you have with your health care provider. ° °

## 2014-11-15 NOTE — ED Provider Notes (Signed)
CSN: [id number]     Arrival date & time 11/15/14  1525 History   First MD Initiated Contact with Patient 11/15/14 1542     No chief complaint on file.    (Consider location/radiation/quality/duration/timing/severity/associated sxs/prior Treatment) HPI Comments: 74 y.o. Female with history of hyperlipidemia, hypothyroidism, GERD, pneumonia presents with her husband for fever, nausea, vomiting, and fatigue.  The patient and her husband report that about 2 weeks ago they were away camping in the [place] with their family and that since that time the grandchildren and some of their children were sick with fevers, nausea, and stomach aches.  The patient's husband than had the same symptoms which seemed to resolved on Sunday.  Since Sunday the patient has started with similar symptoms but today and yesterday had a high fever and when she had a fever and was very fatigued today her husband decided to bring her to the hospital.  The patient reports mostly just nausea with a few episodes of vomiting.  No diarrhea.  She has had mild cough that is non productive but no shortness of breath.  Reports normal urination without pain.  No urinary frequency or urgency.  She and her husband deny rash on themselves or other family members.  No bite marks.  No known ticks.   Past Medical History  Diagnosis Date  . Hypothyroidism   . Hyperlipidemia   . Constipation, chronic   . PONV (postoperative nausea and vomiting)     needs little anesthesia  . GERD (gastroesophageal reflux disease)     zantac  . History of blood transfusion [place]  . Shortness of breath     on exertion  . SUI (stress urinary incontinence, female)   . Shingles   . Macular degeneration 2013    Both eyes   . Pneumonia    Past Surgical History  Procedure Laterality Date  . Tonsillectomy and adenoidectomy    . Cesarean section  1959  . Wisdom tooth extraction    . Cataract extraction  2009, 2011    BOTH EYES  .  Tubal ligation      BY LAPAROSCOPY  . Dilation and curettage of uterus      X2  . Carpal tunnel release  1999  . Colonoscopy       Dr Cristina Gong  . Hysteroscopy w/d&c  01/07/2012    Procedure: DILATATION AND CURETTAGE /HYSTEROSCOPY;  Surgeon: Terrance Mass, MD;  Location: [facility];  Service: Gynecology;  Laterality: N/A;  intrauterine foley catheter for tamponode    Family History  Problem Relation Age of Onset  . Ovarian cancer Mother   . Breast cancer Mother 9  . Hypertension Father   . Prostate cancer Father   . Kidney failure Father   . Diabetes Father   . Hyperlipidemia Brother   . COPD Paternal Grandfather   . Stroke Maternal Grandfather    Social History  Substance Use Topics  . Smoking status: Never Smoker   . Smokeless tobacco: Never Used  . Alcohol Use: 0.0 oz/week    0 Standard drinks or equivalent per week     Comment: Red wine occasionally   OB History    Gravida Para Term Preterm AB TAB SAB Ectopic Multiple Living   3 3  2      2      Review of Systems  Constitutional: Positive for fever, chills, activity change, appetite change and fatigue.  HENT: Negative for congestion, ear  discharge, ear pain, postnasal drip and rhinorrhea.   Eyes: Negative for pain and redness.  Respiratory: Positive for cough. Negative for chest tightness, shortness of breath and wheezing.   Cardiovascular: Negative for chest pain and palpitations.  Gastrointestinal: Positive for nausea and vomiting. Negative for abdominal pain and diarrhea.  Genitourinary: Negative for dysuria, urgency, frequency, hematuria and flank pain.  Musculoskeletal: Positive for myalgias. Negative for back pain.  Skin: Negative for rash.  Neurological: Negative for dizziness, weakness and headaches.  Hematological: Negative for adenopathy. Does not bruise/bleed easily.      Allergies  Phenergan; Levofloxacin; and Pravastatin sodium  Home Medications   Prior to Admission medications   Medication Sig  Start Date End Date Taking? Authorizing Provider  Ascorbic Acid (VITAMIN C CR) 1000 MG TBCR Take 1 each by mouth daily. EMERGEN-C PACKET   Yes Historical Provider, MD  buPROPion (WELLBUTRIN XL) 300 MG 24 hr tablet Take 1 tablet (300 mg total) by mouth daily. 01/20/14  Yes Rosalita Chessman, DO  levothyroxine (SYNTHROID, LEVOTHROID) 125 MCG tablet Take 1 tablet (125 mcg total) by mouth daily before breakfast. Repeat labs are due now 08/25/14  Yes Rosalita Chessman, DO  Multiple Vitamins-Minerals (ICAPS) TABS Take 2 tablets by mouth daily. AREDS 1+2   Yes Historical Provider, MD  Probiotic Product (PROBIOTIC DAILY PO) Take by mouth.   Yes Historical Provider, MD  cephALEXin (KEFLEX) 500 MG capsule Take 1 capsule (500 mg total) by mouth 4 (four) times daily. 11/15/14   Harvel Quale, MD  ondansetron (ZOFRAN) 4 MG tablet Take 1 tablet (4 mg total) by mouth every 8 (eight) hours as needed for nausea or vomiting. 11/15/14   Harvel Quale, MD   BP 135/63 mmHg  Pulse 80  Temp(Src) 99.1 F (37.3 C) (Oral)  Resp 18  Ht 5\' 2"  (1.575 m)  Wt 146 lb (66.225 kg)  BMI 26.70 kg/m2  SpO2 98% Physical Exam  Constitutional: She is oriented to person, place, and time. No distress.  HENT:  Head: Normocephalic and atraumatic.  Right Ear: External ear normal.  Left Ear: External ear normal.  Nose: Nose normal.  Mouth/Throat: Mucous membranes are dry. No oropharyngeal exudate.  Eyes: EOM are normal. Pupils are equal, round, and reactive to light.  Neck: Normal range of motion. Neck supple. No spinous process tenderness and no muscular tenderness present. No rigidity. Normal range of motion present.  Cardiovascular: Normal rate, regular rhythm, normal heart sounds and intact distal pulses.   No murmur heard. Pulmonary/Chest: Effort normal. No respiratory distress. She has no wheezes. She has no rales.  Abdominal: Soft. She exhibits no distension. There is no tenderness.  Musculoskeletal: Normal range of motion.  She exhibits no edema or tenderness.  Neurological: She is alert and oriented to person, place, and time.  Skin: No rash noted. She is not diaphoretic.  Vitals reviewed.   ED Course  Procedures (including critical care time) Labs Review Labs Reviewed  CBC WITH DIFFERENTIAL/PLATELET - Abnormal; Notable for the following:    Hemoglobin 11.7 (*)    HCT 35.1 (*)    Monocytes Absolute 1.2 (*)    All other components within normal limits  COMPREHENSIVE METABOLIC PANEL - Abnormal; Notable for the following:    Sodium 131 (*)    Chloride 95 (*)    Glucose, Bld 128 (*)    Creatinine, Ser 1.22 (*)    Calcium 8.4 (*)    GFR calc non Af Amer 42 (*)  GFR calc Af Amer 49 (*)    All other components within normal limits  URINALYSIS, ROUTINE W REFLEX MICROSCOPIC (NOT AT [facility]) - Abnormal; Notable for the following:    Color, Urine AMBER (*)    APPearance CLOUDY (*)    Ketones, ur 15 (*)    Protein, ur 100 (*)    Leukocytes, UA SMALL (*)    All other components within normal limits  LIPASE, BLOOD - Abnormal; Notable for the following:    Lipase 13 (*)    All other components within normal limits  URINE MICROSCOPIC-ADD ON - Abnormal; Notable for the following:    Bacteria, UA FEW (*)    Casts GRANULAR CAST (*)    All other components within normal limits  URINE CULTURE  CULTURE, BLOOD (ROUTINE X 2)  CULTURE, BLOOD (ROUTINE X 2)  LACTIC ACID, PLASMA  LACTIC ACID, PLASMA  I-STAT CG4 LACTIC ACID, ED    Imaging Review Dg Chest 2 View  11/15/2014   CLINICAL DATA:  Nausea and vomiting for 2 days.  Initial encounter.  EXAM: CHEST  2 VIEW  COMPARISON:  07/10/2012.  FINDINGS: Chronic cardiomegaly. Interstitial pulmonary edema and pulmonary vascular congestion compatible with CHF. No pleural effusions. Thickening of the fissures is present on the lateral projection.  IMPRESSION: Mild CHF.   Electronically Signed   By: Dereck Ligas M.D.   On: 11/15/2014 16:42   I have personally reviewed and  evaluated these images and lab results as part of my medical decision-making.   EKG Interpretation None      MDM  Patient was seen and evaluated in stable condition.  Patient febrile on presentation with dry mucous membranes.  Benign abdominal examination without pain or tenderness.  Chest xray without infiltrate.  Laboratory studies including WBC and lactic acid unremarkable.  UA concerning for possible infection.  Blood cultures and urine culture sent.  Discussed with patient recommendation for admission but refused.  She was given a dose of Rocephin and a prescription for Keflex for outpatient.  She was instructed to follow up outpatient as soon as possible.  She was given strict return precautions which she and her husband expressed understanding of. Final diagnoses:  Fever, unspecified fever cause    1. UTI  2. Febrile Illness    Harvel Quale, MD 11/15/14 [phone]

## 2014-11-15 NOTE — Telephone Encounter (Signed)
[facility] - Client [place]    --------------------------------------------------------------------------------   Patient Name: Amanda [name]  Gender: Female  DOB: Dec 14, 1940   Age: 74 Y 10 M 20 D  Return Phone Number: [phone] (Primary)  Address:     City/State/Zip:  [place]     [facility]  Client Site [facility] Day  Physician [name]   Contact Type Call  Call Type Triage / Clinical  Caller Name [place]  Relationship To Patient Spouse  Appointment Disposition EMR Caller Not Reached  Info pasted into Epic Yes  Return Phone Number [phone] (Primary)  Chief Complaint Walking difficulty  Initial Comment Caller states wife is feeling very lethargic and pale and can barely walk.       Nurse Assessment       Guidelines          Guideline Title Affirmed Question Affirmed Notes Nurse Date/Time (Eastern Time)       Disp. Time Eilene Ghazi Time) Disposition Final User    11/15/2014 3:10:08 PM Attempt made - message left   Amalia Hailey, RN, Melissa    11/15/2014 3:51:32 PM Attempt made - no message left   Amalia Hailey, RN, Lenna Sciara      11/15/2014 4:24:29 PM FINAL ATTEMPT MADE - no message left Yes Amalia Hailey, RN, Melissa              After Care Instructions Given        Call Event Type User Date / Time Description        --------------------------------------------------------------------------------

## 2014-11-15 NOTE — Telephone Encounter (Signed)
Received call from [facility], spouse - came home from work and pt is very lethargic and pale. She was running fever yesterday. He said pt can barely walk. Transferred to [facility] with [facility].

## 2014-11-15 NOTE — ED Notes (Signed)
C/o n/v since Sunday am with pain in bil groin and feeling weak. Husband states he had similar problem a week ago.

## 2014-11-16 ENCOUNTER — Telehealth: Payer: Self-pay | Admitting: Family Medicine

## 2014-11-16 NOTE — Telephone Encounter (Signed)
Patient presented to ED 11/15/14.

## 2014-11-16 NOTE — Telephone Encounter (Signed)
Noted.  Please see [facility] note.  

## 2014-11-16 NOTE — Telephone Encounter (Signed)
Relation to pt: self  Call back number: [phone] Pharmacy:  Reason for call:  Patient inquiring about ED urine results and x ray results.

## 2014-11-17 ENCOUNTER — Inpatient Hospital Stay (HOSPITAL_BASED_OUTPATIENT_CLINIC_OR_DEPARTMENT_OTHER)
Admission: EM | Admit: 2014-11-17 | Discharge: 2014-11-25 | DRG: 177 | Disposition: A | Discharge status: Home Health | Payer: Medicare Other | Attending: Internal Medicine | Admitting: Internal Medicine

## 2014-11-17 ENCOUNTER — Ambulatory Visit: Payer: Medicare Other | Admitting: Family Medicine

## 2014-11-17 ENCOUNTER — Encounter (HOSPITAL_BASED_OUTPATIENT_CLINIC_OR_DEPARTMENT_OTHER): Payer: Self-pay

## 2014-11-17 ENCOUNTER — Emergency Department (HOSPITAL_BASED_OUTPATIENT_CLINIC_OR_DEPARTMENT_OTHER): Payer: Medicare Other

## 2014-11-17 ENCOUNTER — Inpatient Hospital Stay (HOSPITAL_COMMUNITY): Payer: Medicare Other

## 2014-11-17 DIAGNOSIS — D638 Anemia in other chronic diseases classified elsewhere: Secondary | ICD-10-CM | POA: Diagnosis present

## 2014-11-17 DIAGNOSIS — Z79899 Other long term (current) drug therapy: Secondary | ICD-10-CM | POA: Diagnosis not present

## 2014-11-17 DIAGNOSIS — J189 Pneumonia, unspecified organism: Secondary | ICD-10-CM | POA: Diagnosis not present

## 2014-11-17 DIAGNOSIS — R509 Fever, unspecified: Secondary | ICD-10-CM | POA: Diagnosis not present

## 2014-11-17 DIAGNOSIS — J9601 Acute respiratory failure with hypoxia: Secondary | ICD-10-CM | POA: Diagnosis not present

## 2014-11-17 DIAGNOSIS — E039 Hypothyroidism, unspecified: Secondary | ICD-10-CM | POA: Diagnosis not present

## 2014-11-17 DIAGNOSIS — Z888 Allergy status to other drugs, medicaments and biological substances status: Secondary | ICD-10-CM | POA: Diagnosis not present

## 2014-11-17 DIAGNOSIS — R0600 Dyspnea, unspecified: Secondary | ICD-10-CM | POA: Diagnosis not present

## 2014-11-17 DIAGNOSIS — R269 Unspecified abnormalities of gait and mobility: Secondary | ICD-10-CM

## 2014-11-17 DIAGNOSIS — H3530 Unspecified macular degeneration: Secondary | ICD-10-CM | POA: Diagnosis present

## 2014-11-17 DIAGNOSIS — I10 Essential (primary) hypertension: Secondary | ICD-10-CM | POA: Diagnosis not present

## 2014-11-17 DIAGNOSIS — R05 Cough: Secondary | ICD-10-CM | POA: Diagnosis not present

## 2014-11-17 DIAGNOSIS — K219 Gastro-esophageal reflux disease without esophagitis: Secondary | ICD-10-CM | POA: Diagnosis present

## 2014-11-17 DIAGNOSIS — I509 Heart failure, unspecified: Secondary | ICD-10-CM | POA: Diagnosis present

## 2014-11-17 DIAGNOSIS — E871 Hypo-osmolality and hyponatremia: Secondary | ICD-10-CM | POA: Diagnosis present

## 2014-11-17 DIAGNOSIS — E860 Dehydration: Secondary | ICD-10-CM | POA: Diagnosis not present

## 2014-11-17 DIAGNOSIS — A481 Legionnaires' disease: Principal | ICD-10-CM | POA: Diagnosis present

## 2014-11-17 DIAGNOSIS — E785 Hyperlipidemia, unspecified: Secondary | ICD-10-CM | POA: Diagnosis present

## 2014-11-17 DIAGNOSIS — N390 Urinary tract infection, site not specified: Secondary | ICD-10-CM | POA: Diagnosis present

## 2014-11-17 DIAGNOSIS — R51 Headache: Secondary | ICD-10-CM | POA: Diagnosis present

## 2014-11-17 DIAGNOSIS — J918 Pleural effusion in other conditions classified elsewhere: Secondary | ICD-10-CM | POA: Diagnosis not present

## 2014-11-17 DIAGNOSIS — A419 Sepsis, unspecified organism: Secondary | ICD-10-CM

## 2014-11-17 DIAGNOSIS — R0902 Hypoxemia: Secondary | ICD-10-CM

## 2014-11-17 DIAGNOSIS — R918 Other nonspecific abnormal finding of lung field: Secondary | ICD-10-CM | POA: Diagnosis not present

## 2014-11-17 DIAGNOSIS — Z881 Allergy status to other antibiotic agents status: Secondary | ICD-10-CM | POA: Diagnosis not present

## 2014-11-17 LAB — URINE CULTURE

## 2014-11-17 LAB — CBC WITH DIFFERENTIAL/PLATELET
Basophils Absolute: 0 10*3/uL (ref 0.0–0.1)
Basophils Relative: 0 %
EOS ABS: 0 10*3/uL (ref 0.0–0.7)
EOS PCT: 0 %
HCT: 32 % — ABNORMAL LOW (ref 36.0–46.0)
Hemoglobin: 10.9 g/dL — ABNORMAL LOW (ref 12.0–15.0)
LYMPHS ABS: 0.9 10*3/uL (ref 0.7–4.0)
Lymphocytes Relative: 10 %
MCH: 27 pg (ref 26.0–34.0)
MCHC: 34.1 g/dL (ref 30.0–36.0)
MCV: 79.4 fL (ref 78.0–100.0)
MONO ABS: 0.8 10*3/uL (ref 0.1–1.0)
Monocytes Relative: 10 %
Neutro Abs: 6.8 10*3/uL (ref 1.7–7.7)
Neutrophils Relative %: 80 %
PLATELETS: 220 10*3/uL (ref 150–400)
RBC: 4.03 MIL/uL (ref 3.87–5.11)
RDW: 13.6 % (ref 11.5–15.5)
WBC: 8.5 10*3/uL (ref 4.0–10.5)

## 2014-11-17 LAB — BASIC METABOLIC PANEL
Anion gap: 10 (ref 5–15)
BUN: 17 mg/dL (ref 6–20)
CHLORIDE: 94 mmol/L — AB (ref 101–111)
CO2: 23 mmol/L (ref 22–32)
CREATININE: 1.17 mg/dL — AB (ref 0.44–1.00)
Calcium: 7.7 mg/dL — ABNORMAL LOW (ref 8.9–10.3)
GFR calc Af Amer: 51 mL/min — ABNORMAL LOW (ref >60)
GFR calc non Af Amer: 44 mL/min — ABNORMAL LOW (ref >60)
Glucose, Bld: 123 mg/dL — ABNORMAL HIGH (ref 65–99)
Potassium: 3.7 mmol/L (ref 3.5–5.1)
SODIUM: 127 mmol/L — AB (ref 135–145)

## 2014-11-17 LAB — I-STAT CG4 LACTIC ACID, ED: LACTIC ACID, VENOUS: 1.4 mmol/L (ref 0.5–2.0)

## 2014-11-17 MED ORDER — SODIUM CHLORIDE 0.9 % IV BOLUS (SEPSIS): 500.0000 mL | INTRAVENOUS | Status: AC

## 2014-11-17 MED ORDER — DEXTROSE 5 % IV SOLN: 1.0000 g | INTRAVENOUS | Status: DC

## 2014-11-17 MED ORDER — DEXTROSE 5 % IV SOLN
500.0000 mg | Freq: Once | INTRAVENOUS | Status: AC
  Administered 2014-11-17: 500 mg via INTRAVENOUS

## 2014-11-17 MED ORDER — ACETAMINOPHEN 650 MG RE SUPP: 650.0000 mg | Freq: Four times a day (QID) | RECTAL | Status: DC | PRN

## 2014-11-17 MED ORDER — BUPROPION HCL ER (XL) 150 MG PO TB24
300.0000 mg | ORAL_TABLET | Freq: Every day | ORAL | Status: DC
  Administered 2014-11-18 – 2014-11-25 (×8): 300 mg via ORAL
  Filled 2014-11-17 (×8): qty 2

## 2014-11-17 MED ORDER — ACETAMINOPHEN 325 MG PO TABS
650.0000 mg | ORAL_TABLET | Freq: Four times a day (QID) | ORAL | Status: DC | PRN
  Administered 2014-11-18 (×2): 650 mg via ORAL
  Filled 2014-11-17 (×2): qty 2

## 2014-11-17 MED ORDER — RISAQUAD PO CAPS
1.0000 | ORAL_CAPSULE | Freq: Every day | ORAL | Status: DC
  Administered 2014-11-18 – 2014-11-25 (×8): 1 via ORAL
  Filled 2014-11-17 (×9): qty 1

## 2014-11-17 MED ORDER — CEFTRIAXONE SODIUM 1 G IJ SOLR
INTRAMUSCULAR | Status: AC
  Filled 2014-11-17: qty 10

## 2014-11-17 MED ORDER — OCUVITE-LUTEIN PO CAPS
1.0000 | ORAL_CAPSULE | Freq: Every day | ORAL | Status: DC
  Administered 2014-11-18 – 2014-11-25 (×8): 1 via ORAL
  Filled 2014-11-17 (×11): qty 1

## 2014-11-17 MED ORDER — AZITHROMYCIN 500 MG PO TABS: 500.0000 mg | ORAL_TABLET | ORAL | Status: DC

## 2014-11-17 MED ORDER — DEXTROSE 5 % IV SOLN
1.0000 g | Freq: Once | INTRAVENOUS | Status: AC
  Administered 2014-11-17: 1 g via INTRAVENOUS

## 2014-11-17 MED ORDER — AZITHROMYCIN 500 MG IV SOLR
INTRAVENOUS | Status: AC
  Filled 2014-11-17: qty 500

## 2014-11-17 MED ORDER — SODIUM CHLORIDE 0.9 % IV BOLUS (SEPSIS)
1000.0000 mL | INTRAVENOUS | Status: AC
  Administered 2014-11-17: 1000 mL via INTRAVENOUS

## 2014-11-17 MED ORDER — VITAMIN C 500 MG PO TABS
1000.0000 mg | ORAL_TABLET | Freq: Every day | ORAL | Status: DC
  Administered 2014-11-18 – 2014-11-25 (×8): 1000 mg via ORAL
  Filled 2014-11-17 (×8): qty 2

## 2014-11-17 MED ORDER — SODIUM CHLORIDE 0.9 % IV SOLN
INTRAVENOUS | Status: DC
  Administered 2014-11-17: via INTRAVENOUS

## 2014-11-17 MED ORDER — LEVOTHYROXINE SODIUM 125 MCG PO TABS
125.0000 ug | ORAL_TABLET | Freq: Every day | ORAL | Status: DC
  Administered 2014-11-18 – 2014-11-25 (×8): 125 ug via ORAL
  Filled 2014-11-17 (×8): qty 1

## 2014-11-17 MED ORDER — ACETAMINOPHEN 325 MG PO TABS
650.0000 mg | ORAL_TABLET | Freq: Once | ORAL | Status: AC
  Administered 2014-11-17: 650 mg via ORAL

## 2014-11-17 MED ORDER — AZITHROMYCIN 500 MG PO TABS
500.0000 mg | ORAL_TABLET | ORAL | Status: DC
Start: 2014-11-18 — End: 2014-11-19
  Administered 2014-11-18: 500 mg via ORAL
  Filled 2014-11-17: qty 1

## 2014-11-17 MED ORDER — ACETAMINOPHEN 325 MG PO TABS
ORAL_TABLET | ORAL | Status: AC
  Filled 2014-11-17: qty 2

## 2014-11-17 MED ORDER — HEPARIN SODIUM (PORCINE) 5000 UNIT/ML IJ SOLN
5000.0000 [IU] | Freq: Three times a day (TID) | INTRAMUSCULAR | Status: DC
  Administered 2014-11-18 – 2014-11-25 (×21): 5000 [IU] via SUBCUTANEOUS
  Filled 2014-11-17 (×14): qty 1

## 2014-11-17 MED ORDER — DEXTROSE 5 % IV SOLN
1.0000 g | INTRAVENOUS | Status: DC
Start: 2014-11-18 — End: 2014-11-19
  Administered 2014-11-18: 1 g via INTRAVENOUS
  Filled 2014-11-17 (×2): qty 10

## 2014-11-17 MED ORDER — SODIUM CHLORIDE 0.9 % IV BOLUS (SEPSIS)
250.0000 mL | Freq: Once | INTRAVENOUS | Status: AC
  Administered 2014-11-17: 500 mL via INTRAVENOUS

## 2014-11-17 NOTE — ED Provider Notes (Signed)
CSN: [id number]     Arrival date & time 11/17/14  1446 History   First MD Initiated Contact with Patient 11/17/14 1522     Chief Complaint  Patient presents with  . Urinary Tract Infection      Patient is a 74 y.o. female presenting with fever. The history is provided by the patient.  Fever Onset quality:  Gradual Timing:  Constant Progression:  Worsening Chronicity:  Recurrent Relieved by:  Nothing Worsened by:  Nothing tried Associated symptoms: cough, headaches and nausea   Associated symptoms: no diarrhea and no vomiting   pt reports continued fever/fatigue over past several days  She was seen on 9/13 for fever and found to have UTI and is on keflex She reports she still has fevers and is now having cough and SOB She also reports mild HA   Past Medical History  Diagnosis Date  . Hypothyroidism   . Hyperlipidemia   . Constipation, chronic   . PONV (postoperative nausea and vomiting)     needs little anesthesia  . GERD (gastroesophageal reflux disease)     zantac  . History of blood transfusion [place]  . Shortness of breath     on exertion  . SUI (stress urinary incontinence, female)   . Shingles   . Macular degeneration 2013    Both eyes   . Pneumonia   . CHF (congestive heart failure)    Past Surgical History  Procedure Laterality Date  . Tonsillectomy and adenoidectomy    . Cesarean section  1959  . Wisdom tooth extraction    . Cataract extraction  2009, 2011    BOTH EYES  . Tubal ligation      BY LAPAROSCOPY  . Dilation and curettage of uterus      X2  . Carpal tunnel release  1999  . Colonoscopy       Dr Cristina Gong  . Hysteroscopy w/d&c  01/07/2012    Procedure: DILATATION AND CURETTAGE /HYSTEROSCOPY;  Surgeon: Terrance Mass, MD;  Location: [facility];  Service: Gynecology;  Laterality: N/A;  intrauterine foley catheter for tamponode    Family History  Problem Relation Age of Onset  . Ovarian cancer Mother   . Breast cancer Mother 44  .  Hypertension Father   . Prostate cancer Father   . Kidney failure Father   . Diabetes Father   . Hyperlipidemia Brother   . COPD Paternal Grandfather   . Stroke Maternal Grandfather    Social History  Substance Use Topics  . Smoking status: Never Smoker   . Smokeless tobacco: Never Used  . Alcohol Use: 0.0 oz/week    0 Standard drinks or equivalent per week     Comment: Red wine occasionally   OB History    Gravida Para Term Preterm AB TAB SAB Ectopic Multiple Living   3 3  2      2      Review of Systems  Constitutional: Positive for fever.  Respiratory: Positive for cough.   Gastrointestinal: Positive for nausea. Negative for vomiting and diarrhea.  Neurological: Positive for headaches.  All other systems reviewed and are negative.     Allergies  Phenergan; Levofloxacin; and Pravastatin sodium  Home Medications   Prior to Admission medications   Medication Sig Start Date End Date Taking? Authorizing Provider  Ascorbic Acid (VITAMIN C CR) 1000 MG TBCR Take 1 each by mouth daily. EMERGEN-C PACKET    Historical Provider, MD  buPROPion (  WELLBUTRIN XL) 300 MG 24 hr tablet Take 1 tablet (300 mg total) by mouth daily. 01/20/14   Rosalita Chessman, DO  cephALEXin (KEFLEX) 500 MG capsule Take 1 capsule (500 mg total) by mouth 4 (four) times daily. 11/15/14   Harvel Quale, MD  levothyroxine (SYNTHROID, LEVOTHROID) 125 MCG tablet Take 1 tablet (125 mcg total) by mouth daily before breakfast. Repeat labs are due now 08/25/14   Rosalita Chessman, DO  Multiple Vitamins-Minerals (ICAPS) TABS Take 2 tablets by mouth daily. AREDS 1+2    Historical Provider, MD  ondansetron (ZOFRAN) 4 MG tablet Take 1 tablet (4 mg total) by mouth every 8 (eight) hours as needed for nausea or vomiting. 11/15/14   Harvel Quale, MD  Probiotic Product (PROBIOTIC DAILY PO) Take by mouth.    Historical Provider, MD   BP 129/57 mmHg  Pulse 86  Temp(Src) 101.9 F (38.8 C) (Oral)  Resp 28  Ht 5\' 2"  (1.575 m)   Wt 146 lb (66.225 kg)  BMI 26.70 kg/m2  SpO2 96% Physical Exam CONSTITUTIONAL: Well developed/well nourished HEAD: Normocephalic/atraumatic EYES: EOMI ENMT: Mucous membranes dry NECK: supple no meningeal signs SPINE/BACK:entire spine nontender CV: S1/S2 noted, no murmurs/rubs/gallops noted LUNGS: crackles right base, no distress noted ABDOMEN: soft, nontender, no rebound or guarding, bowel sounds noted throughout abdomen GU:no cva tenderness NEURO: Pt is awake/alert, moves all extremitiesx4.  No facial droop.   EXTREMITIES: pulses normal/equal, full ROM SKIN: warm, color normal PSYCH: no abnormalities of mood noted, alert and oriented to situation  ED Course  Procedures  4:55 PM Pt with recent fever and UTI now with cough/fever/SOB She is noted to have pneumonia Also has had episodes of confusion Will admit for pneumonia as she is confused and would benefit from IV antibiotics 6:01 PM D/w dr Verlon Au will admit to [facility] tele Pt stable in the ED  Medications  cefTRIAXone (ROCEPHIN) 1 g in dextrose 5 % 50 mL IVPB (1 g Intravenous New Bag/Given 11/17/14 1638)  azithromycin (ZITHROMAX) 500 mg in dextrose 5 % 250 mL IVPB (not administered)  cefTRIAXone (ROCEPHIN) 1 G injection (not administered)  acetaminophen (TYLENOL) tablet 650 mg (650 mg Oral Given 11/17/14 1459)  sodium chloride 0.9 % bolus 250 mL (0 mLs Intravenous Stopped 11/17/14 1601)    Labs Review Labs Reviewed  BASIC METABOLIC PANEL - Abnormal; Notable for the following:    Sodium 127 (*)    Chloride 94 (*)    Glucose, Bld 123 (*)    Creatinine, Ser 1.17 (*)    Calcium 7.7 (*)    GFR calc non Af Amer 44 (*)    GFR calc Af Amer 51 (*)    All other components within normal limits  CBC WITH DIFFERENTIAL/PLATELET - Abnormal; Notable for the following:    Hemoglobin 10.9 (*)    HCT 32.0 (*)    All other components within normal limits  I-STAT CG4 LACTIC ACID, ED    Imaging Review Dg Chest 2  View  11/17/2014   CLINICAL DATA:  Cough, fever, body aches, weakness, fatigue. Nausea and loss of appetite since Sunday. History of CHF, pneumonia, shingles.  EXAM: CHEST  2 VIEW  COMPARISON:  11/15/2014  FINDINGS: The heart is enlarged. There is interstitial edema. There has been development of significant right upper lobe and right lower lobe infiltrate, likely infectious. Small bilateral pleural effusions are present.  IMPRESSION: 1. Right upper lobe and right lower lobe infiltrates. 2. Cardiomegaly and mild edema.  Small effusions.   Electronically Signed   By: Nolon Nations M.D.   On: 11/17/2014 16:17   I have personally reviewed and evaluated these images and lab results as part of my medical decision-making.   Medications  cefTRIAXone (ROCEPHIN) 1 g in dextrose 5 % 50 mL IVPB (1 g Intravenous New Bag/Given 11/17/14 1638)  azithromycin (ZITHROMAX) 500 mg in dextrose 5 % 250 mL IVPB (not administered)  cefTRIAXone (ROCEPHIN) 1 G injection (not administered)  acetaminophen (TYLENOL) tablet 650 mg (650 mg Oral Given 11/17/14 1459)  sodium chloride 0.9 % bolus 250 mL (0 mLs Intravenous Stopped 11/17/14 1601)    MDM   Final diagnoses:  CAP (community acquired pneumonia)  Dehydration    Nursing notes including past medical history and social history reviewed and considered in documentation Previous records reviewed and considered Labs/vital reviewed myself and considered during evaluation xrays/imaging reviewed by myself and considered during evaluation     Ripley Fraise, MD 11/17/14 1801

## 2014-11-17 NOTE — Telephone Encounter (Signed)
Xray --- chf---is pt sob-- she may need appointment here Urine culture pending-- so far neg

## 2014-11-17 NOTE — ED Notes (Signed)
Patient transported to X-ray 

## 2014-11-17 NOTE — Telephone Encounter (Signed)
Spoke with husband and the patient is having SOB and they declined the ED, she will come in today ay 2 pm.      KP

## 2014-11-17 NOTE — ED Notes (Signed)
Pt dx with UTI 2 days ago-pt with c/o cont'd fever, weakness and was advised by PCP visit today she has CHF

## 2014-11-17 NOTE — Telephone Encounter (Signed)
Message left to call the office.    KP 

## 2014-11-17 NOTE — ED Provider Notes (Signed)
Pt stable awaiting transport She is awake/alert   Ripley Fraise, MD 11/17/14 1919

## 2014-11-17 NOTE — Telephone Encounter (Signed)
Patient called me back, he stated after I spoke with him earlier today, he never told Mkenzie at the CHF, the first time she heard it is when the Triage nurse told her but he really did not her to know so he played as if he was not aware. He apologized. He said he did not want me to look as if I did not tell him. He said they are at the Hospital now and he is a little upset the hospital did not tell them when they were there, he said he would have kept her at the hospital if he would have known.       KP

## 2014-11-17 NOTE — Telephone Encounter (Signed)
UA and culture done on 11/15/14 int he Ed. Please advise     KP

## 2014-11-17 NOTE — H&P (Signed)
[facility] History and Physical  Amanda [name] RDE:[id number] DOB: 02-21-1941 DOA: 11/17/2014  Referring physician: Ripley Fraise, MD PCP: [name], DO   Chief Complaint: Pneumonia  HPI: Amanda [name] is a 74 y.o. female with history of GERD HLD hypothyroid CHF presents to the hospital with increased SOB and Fever. She states that she was seen at [facility] for urinary complaints. She was diagnosed with a UTI and sent home. Patient states that she did not really feel much better and her fevers were actually up by wednesday. Patient went back to the ED after being seen by her PCP today. She has been having chills and has been having nausea and vomiting. Patient states that she ha shad poor appetite. Patient states that she has not been able to keep liquids down. She has no diarrhea,. She feels bloated though. She states that she has had some headaches and body aches. She is also having shortness of breath more now. She was around 9 of her grand kids and her husband noted that a couple of them did fall ill also.   Review of Systems:  Constitutional:  12 point ROS performed and is unremarkable other than HPI.   Past Medical History  Diagnosis Date  . Hypothyroidism   . Hyperlipidemia   . Constipation, chronic   . PONV (postoperative nausea and vomiting)     needs little anesthesia  . GERD (gastroesophageal reflux disease)     zantac  . History of blood transfusion [place]  . Shortness of breath     on exertion  . SUI (stress urinary incontinence, female)   . Shingles   . Macular degeneration 2013    Both eyes   . Pneumonia   . CHF (congestive heart failure)    Past Surgical History  Procedure Laterality Date  . Tonsillectomy and adenoidectomy    . Cesarean section  1959  . Wisdom tooth extraction    . Cataract extraction  2009, 2011    BOTH EYES  . Tubal ligation      BY LAPAROSCOPY  . Dilation and curettage of uterus      X2  . Carpal tunnel release   1999  . Colonoscopy       Dr Cristina Gong  . Hysteroscopy w/d&c  01/07/2012    Procedure: DILATATION AND CURETTAGE /HYSTEROSCOPY;  Surgeon: Terrance Mass, MD;  Location: [facility];  Service: Gynecology;  Laterality: N/A;  intrauterine foley catheter for tamponode    Social History:  reports that she has never smoked. She has never used smokeless tobacco. She reports that she drinks alcohol. She reports that she does not use illicit drugs.  Allergies  Allergen Reactions  . Phenergan [Promethazine Hcl] Other (See Comments)    Jerking/agitation  . Levofloxacin Nausea And Vomiting  . Pravastatin Sodium Nausea And Vomiting    Family History  Problem Relation Age of Onset  . Ovarian cancer Mother   . Breast cancer Mother 43  . Hypertension Father   . Prostate cancer Father   . Kidney failure Father   . Diabetes Father   . Hyperlipidemia Brother   . COPD Paternal Grandfather   . Stroke Maternal Grandfather      Prior to Admission medications   Medication Sig Start Date End Date Taking? Authorizing Provider  Ascorbic Acid (VITAMIN C CR) 1000 MG TBCR Take 1 each by mouth daily. EMERGEN-C PACKET    Historical Provider, MD  buPROPion (WELLBUTRIN XL) 300  MG 24 hr tablet Take 1 tablet (300 mg total) by mouth daily. 01/20/14   Rosalita Chessman, DO  cephALEXin (KEFLEX) 500 MG capsule Take 1 capsule (500 mg total) by mouth 4 (four) times daily. 11/15/14   Harvel Quale, MD  levothyroxine (SYNTHROID, LEVOTHROID) 125 MCG tablet Take 1 tablet (125 mcg total) by mouth daily before breakfast. Repeat labs are due now 08/25/14   Rosalita Chessman, DO  Multiple Vitamins-Minerals (ICAPS) TABS Take 2 tablets by mouth daily. AREDS 1+2    Historical Provider, MD  ondansetron (ZOFRAN) 4 MG tablet Take 1 tablet (4 mg total) by mouth every 8 (eight) hours as needed for nausea or vomiting. 11/15/14   Harvel Quale, MD  Probiotic Product (PROBIOTIC DAILY PO) Take by mouth.    Historical Provider, MD   Physical  Exam: Filed Vitals:   11/17/14 1930 11/17/14 1941 11/17/14 2110 11/17/14 2247  BP: 120/58 120/58 133/72 140/71  Pulse: 73 73 73 8  Temp:   99.8 F (37.7 C) 99.8 F (37.7 C)  TempSrc:    Oral  Resp: 27 18 18 18   Height:    5\' 2"  (1.575 m)  Weight:    67.903 kg (149 lb 11.2 oz)  SpO2: 93% 95% 95% 97%    Wt Readings from Last 3 Encounters:  11/17/14 67.903 kg (149 lb 11.2 oz)  11/15/14 66.225 kg (146 lb)  06/02/14 69.854 kg (154 lb)    General:  Appears calm and comfortable Eyes: PERRL, normal lids, irises & conjunctiva ENT: grossly normal hearing, lips & tongue Neck: no LAD, masses or thyromegaly Cardiovascular: RRR, no m/r/g. No LE edema. Respiratory: CTA bilaterally, no w/r/r. Normal respiratory effort. Abdomen: soft, ntnd Skin: no rash or induration seen on limited exam Musculoskeletal: grossly normal tone BUE/BLE Psychiatric: grossly normal mood and affect, speech fluent and appropriate Neurologic: grossly non-focal.          Labs on Admission:  Basic Metabolic Panel:  Recent Labs Lab 11/15/14 1607 11/17/14 1525  NA 131* 127*  K 3.6 3.7  CL 95* 94*  CO2 25 23  GLUCOSE 128* 123*  BUN 17 17  CREATININE 1.22* 1.17*  CALCIUM 8.4* 7.7*   Liver Function Tests:  Recent Labs Lab 11/15/14 1607  AST 23  ALT 17  ALKPHOS 72  BILITOT 0.6  PROT 6.8  ALBUMIN 3.5    Recent Labs Lab 11/15/14 1607  LIPASE 13*   No results for input(s): AMMONIA in the last 168 hours. CBC:  Recent Labs Lab 11/15/14 1607 11/17/14 1525  WBC 9.8 8.5  NEUTROABS 7.4 6.8  HGB 11.7* 10.9*  HCT 35.1* 32.0*  MCV 81.8 79.4  PLT 212 220   Cardiac Enzymes: No results for input(s): CKTOTAL, CKMB, CKMBINDEX, TROPONINI in the last 168 hours.  BNP (last 3 results) No results for input(s): BNP in the last 8760 hours.  ProBNP (last 3 results) No results for input(s): PROBNP in the last 8760 hours.  CBG: No results for input(s): GLUCAP in the last 168 hours.  Radiological  Exams on Admission: Dg Chest 2 View  11/17/2014   CLINICAL DATA:  Cough, fever, body aches, weakness, fatigue. Nausea and loss of appetite since Sunday. History of CHF, pneumonia, shingles.  EXAM: CHEST  2 VIEW  COMPARISON:  11/15/2014  FINDINGS: The heart is enlarged. There is interstitial edema. There has been development of significant right upper lobe and right lower lobe infiltrate, likely infectious. Small bilateral pleural effusions are present.  IMPRESSION: 1. Right upper lobe and right lower lobe infiltrates. 2. Cardiomegaly and mild edema.  Small effusions.   Electronically Signed   By: Nolon Nations M.D.   On: 11/17/2014 16:17       Assessment/Plan Principal Problem:   CAP (community acquired pneumonia) Active Problems:   Hypothyroidism   HLD (hyperlipidemia)   GERD (gastroesophageal reflux disease)   1. CAP Multilobar Pneumonia/with possible sepsis -will start on azithromycin and rocephin -Will get blood cultures -sputum cultures ordered -urine ag for strep an legionella  2. Hypothyroid -will check TSH -continue with synthroid  3. Hyperlipidemia -check lipid panel  4. GERD PPI as needed  5. Anemia -will check stool occult blood -check iron studies  6. Hyponatremia -due to pneumonia -will hydrate and monitor labs   Code Status: full code (must indicate code status--if unknown or must be presumed, indicate so) DVT Prophylaxis:heparin Family Communication: none (indicate person spoken with, if applicable, with phone number if by telephone) Disposition Plan: home (indicate anticipated LOS)    [facility] Pager [phone]

## 2014-11-17 NOTE — ED Notes (Signed)
Pt reports increased shortness of breath and coughing since 2 days ago.  Was seen here dx with UTI.  Denies urinary symptoms.  Spouse reports dry heeving.  Pt reports confusion that she tried to put ice in her cup with the lid on.  Pt alert and oriented x 4 at this time.

## 2014-11-18 LAB — CBC
HEMATOCRIT: 29.4 % — AB (ref 36.0–46.0)
HEMATOCRIT: 30 % — AB (ref 36.0–46.0)
HEMOGLOBIN: 10.1 g/dL — AB (ref 12.0–15.0)
Hemoglobin: 10.2 g/dL — ABNORMAL LOW (ref 12.0–15.0)
MCH: 26.8 pg (ref 26.0–34.0)
MCH: 27 pg (ref 26.0–34.0)
MCHC: 34 g/dL (ref 30.0–36.0)
MCHC: 34.4 g/dL (ref 30.0–36.0)
MCV: 78.7 fL (ref 78.0–100.0)
MCV: 78.6 fL (ref 78.0–100.0)
PLATELETS: 214 10*3/uL (ref 150–400)
Platelets: 210 10*3/uL (ref 150–400)
RBC: 3.81 MIL/uL — ABNORMAL LOW (ref 3.87–5.11)
RBC: 3.74 MIL/uL — AB (ref 3.87–5.11)
RDW: 13.6 % (ref 11.5–15.5)
RDW: 13.6 % (ref 11.5–15.5)
WBC: 7.6 10*3/uL (ref 4.0–10.5)
WBC: 7.4 10*3/uL (ref 4.0–10.5)

## 2014-11-18 LAB — COMPREHENSIVE METABOLIC PANEL
ALT: 32 U/L (ref 14–54)
AST: 52 U/L — ABNORMAL HIGH (ref 15–41)
Albumin: 2.4 g/dL — ABNORMAL LOW (ref 3.5–5.0)
Alkaline Phosphatase: 80 U/L (ref 38–126)
Anion gap: 9 (ref 5–15)
BUN: 11 mg/dL (ref 6–20)
CHLORIDE: 95 mmol/L — AB (ref 101–111)
CO2: 23 mmol/L (ref 22–32)
CREATININE: 1.02 mg/dL — AB (ref 0.44–1.00)
Calcium: 7.3 mg/dL — ABNORMAL LOW (ref 8.9–10.3)
GFR calc non Af Amer: 52 mL/min — ABNORMAL LOW (ref >60)
Glucose, Bld: 121 mg/dL — ABNORMAL HIGH (ref 65–99)
Potassium: 3.6 mmol/L (ref 3.5–5.1)
SODIUM: 127 mmol/L — AB (ref 135–145)
Total Bilirubin: 0.5 mg/dL (ref 0.3–1.2)
Total Protein: 5.4 g/dL — ABNORMAL LOW (ref 6.5–8.1)

## 2014-11-18 LAB — IRON AND TIBC
IRON: 13 ug/dL — AB (ref 28–170)
SATURATION RATIOS: 8 % — AB (ref 10.4–31.8)
TIBC: 171 ug/dL — ABNORMAL LOW (ref 250–450)
UIBC: 158 ug/dL

## 2014-11-18 LAB — CREATININE, SERUM
CREATININE: 0.99 mg/dL (ref 0.44–1.00)
GFR calc Af Amer: >60 mL/min (ref >60)
GFR calc non Af Amer: 54 mL/min — ABNORMAL LOW (ref >60)

## 2014-11-18 LAB — STREP PNEUMONIAE URINARY ANTIGEN: STREP PNEUMO URINARY ANTIGEN: NEGATIVE

## 2014-11-18 LAB — APTT: APTT: 32 s (ref 24–37)

## 2014-11-18 LAB — GLUCOSE, CAPILLARY: Glucose-Capillary: 111 mg/dL — ABNORMAL HIGH (ref 65–99)

## 2014-11-18 LAB — TSH: TSH: 2.018 u[IU]/mL (ref 0.350–4.500)

## 2014-11-18 LAB — FERRITIN: FERRITIN: 1489 ng/mL — AB (ref 11–307)

## 2014-11-18 LAB — LACTIC ACID, PLASMA
Lactic Acid, Venous: 1.1 mmol/L (ref 0.5–2.0)
Lactic Acid, Venous: 1.1 mmol/L (ref 0.5–2.0)

## 2014-11-18 LAB — PROCALCITONIN: Procalcitonin: 1.71 ng/mL

## 2014-11-18 LAB — PROTIME-INR
INR: 1.32 (ref 0.00–1.49)
Prothrombin Time: 16.5 seconds — ABNORMAL HIGH (ref 11.6–15.2)

## 2014-11-18 LAB — VITAMIN B12: Vitamin B-12: 658 pg/mL (ref 180–914)

## 2014-11-18 MED ORDER — GUAIFENESIN ER 600 MG PO TB12
1200.0000 mg | ORAL_TABLET | Freq: Two times a day (BID) | ORAL | Status: DC
  Administered 2014-11-18 – 2014-11-25 (×14): 1200 mg via ORAL
  Filled 2014-11-18 (×14): qty 2

## 2014-11-18 MED ORDER — SODIUM CHLORIDE 0.9 % IV SOLN
INTRAVENOUS | Status: DC
  Administered 2014-11-18: 23:00:00 via INTRAVENOUS

## 2014-11-18 MED ORDER — ZOLPIDEM TARTRATE 5 MG PO TABS
5.0000 mg | ORAL_TABLET | Freq: Every evening | ORAL | Status: DC | PRN
  Administered 2014-11-18 – 2014-11-24 (×7): 5 mg via ORAL
  Filled 2014-11-18 (×8): qty 1

## 2014-11-18 MED ORDER — BISACODYL 5 MG PO TBEC
10.0000 mg | DELAYED_RELEASE_TABLET | Freq: Once | ORAL | Status: AC
  Administered 2014-11-18: 10 mg via ORAL
  Filled 2014-11-18: qty 2

## 2014-11-18 MED ORDER — CETYLPYRIDINIUM CHLORIDE 0.05 % MT LIQD
7.0000 mL | Freq: Two times a day (BID) | OROMUCOSAL | Status: DC
  Administered 2014-11-18 – 2014-11-25 (×13): 7 mL via OROMUCOSAL

## 2014-11-18 MED ORDER — ALBUTEROL SULFATE (2.5 MG/3ML) 0.083% IN NEBU
2.5000 mg | INHALATION_SOLUTION | RESPIRATORY_TRACT | Status: DC | PRN
  Administered 2014-11-18 – 2014-11-19 (×5): 2.5 mg via RESPIRATORY_TRACT
  Filled 2014-11-18 (×5): qty 3

## 2014-11-18 NOTE — Progress Notes (Signed)
Patient Demographics  Amanda [name], is a 74 y.o. female, DOB - 1940/09/07, HGD:[id number]  Admit date - 11/17/2014   Admitting Physician Nita Sells, MD  Outpatient Primary MD for the patient is [name], DO  LOS - 1   Chief Complaint  Patient presents with  . Urinary Tract Infection        Subjective:   Amanda [name] today has, No headache, No chest pain, No abdominal pain - No Nausea, patient complains of shortness of breath, cough, nonproductive, as well as report generalized weakness.  Assessment & Plan    Principal Problem:   CAP (community acquired pneumonia) Active Problems:   Hypothyroidism   HLD (hyperlipidemia)   GERD (gastroesophageal reflux disease)   Community-acquired pneumonia  - Continue with azithromycin and Rocephin  - Follow on blood cultures, follow on urine antigen for strep and legionella , follow sputum culture  - Continue with Mucinex, flutter valve, pulmonary toilet   Hypothyroidism - Continue with Synthroid - TSH within normal limits  Hyperlipidemia - Follow on lipid panel   Anemia  - Anemia of chronic illness, iron is 13, ferritin is 1489 , continue to monitor .   hyponatremia - Related to pneumonia, continue  to monitor on IV NS.[name]  Code Status: Full  Family Communication: family at bedside  Disposition Plan: pending PT evaluation when patient is stable.   Procedures  None    Consults   None    Medications  Scheduled Meds: . acidophilus  1 capsule Oral Daily  . antiseptic oral rinse  7 mL Mouth Rinse BID  . azithromycin  500 mg Oral Q24H  . buPROPion  300 mg Oral Daily  . cefTRIAXone (ROCEPHIN)  IV  1 g Intravenous Q24H  . guaiFENesin  1,200 mg Oral BID  . heparin  5,000 Units Subcutaneous 3 times per day  . levothyroxine  125 mcg Oral QAC breakfast  . multivitamin-lutein  1 capsule Oral Daily  . vitamin C  1,000 mg Oral  Daily   Continuous Infusions: . sodium chloride 50 mL/hr at 11/17/14 2330   PRN Meds:.acetaminophen **OR** acetaminophen, albuterol, zolpidem  DVT Prophylaxis Heparin   Lab Results  Component Value Date   PLT 214 11/18/2014    Antibiotics    Anti-infectives    Start     Dose/Rate Route Frequency Ordered Stop   11/18/14 1730  azithromycin (ZITHROMAX) tablet 500 mg     500 mg Oral Every 24 hours 11/17/14 2326 11/24/14 1729   11/18/14 1630  cefTRIAXone (ROCEPHIN) 1 g in dextrose 5 % 50 mL IVPB     1 g 100 mL/hr over 30 Minutes Intravenous Every 24 hours 11/17/14 2326 11/24/14 1629   11/17/14 2330  cefTRIAXone (ROCEPHIN) 1 g in dextrose 5 % 50 mL IVPB  Status:  Discontinued     1 g 100 mL/hr over 30 Minutes Intravenous Every 24 hours 11/17/14 2318 11/17/14 2326   11/17/14 2330  azithromycin (ZITHROMAX) tablet 500 mg  Status:  Discontinued     500 mg Oral Every 24 hours 11/17/14 2318 11/17/14 2326   11/17/14 1725  azithromycin (ZITHROMAX) 500 MG injection    Comments:  Eulogio Ditch   : cabinet override      11/17/14  1725 11/18/14 0529   11/17/14 1630  cefTRIAXone (ROCEPHIN) 1 g in dextrose 5 % 50 mL IVPB     1 g 100 mL/hr over 30 Minutes Intravenous  Once 11/17/14 1626 11/17/14 1724   11/17/14 1630  azithromycin (ZITHROMAX) 500 mg in dextrose 5 % 250 mL IVPB     500 mg 250 mL/hr over 60 Minutes Intravenous  Once 11/17/14 1626 11/17/14 1940   11/17/14 1628  cefTRIAXone (ROCEPHIN) 1 G injection    Comments:  Eulogio Ditch   : cabinet override      11/17/14 1628 11/18/14 0444          Objective:   Filed Vitals:   11/18/14 0123 11/18/14 0404 11/18/14 0924 11/18/14 1322  BP: 161/76 137/48 155/81   Pulse: 83 85 87 83  Temp: 100.8 F (38.2 C) 98.4 F (36.9 C) 99.9 F (37.7 C) 100.8 F (38.2 C)  TempSrc: Oral Oral Oral Oral  Resp: 20 18 18    Height:      Weight:      SpO2: 91% 97% 98% 97%    Wt Readings from Last 3 Encounters:  11/17/14 67.903 kg (149 lb 11.2  oz)  11/15/14 66.225 kg (146 lb)  06/02/14 69.854 kg (154 lb)     Intake/Output Summary (Last 24 hours) at 11/18/14 1641 Last data filed at 11/18/14 0600  Gross per 24 hour  Intake 2931.67 ml  Output    350 ml  Net 2581.67 ml     Physical Exam  Awake Alert, frail [place].AT,PERRAL Supple Neck,No JVD, Symmetrical Chest  wall movement , right lung reveals  No Gallops,Rubs or new Murmurs, No Parasternal Heave +ve B.Sounds, Abd Soft, No tenderness, No organomegaly appriciated, No Cyanosis, Clubbing or edema, No new Rash or bruise    Data Review   Micro Results Recent Results (from the past 240 hour(s))  Blood culture (routine x 2)     Status: None (Preliminary result)   Collection Time: 11/15/14  4:07 PM  Result Value Ref Range Status   Specimen Description BLOOD RIGHT ANTECUBITAL  Final   Special Requests BOTTLES DRAWN AEROBIC AND ANAEROBIC 5 ML EACH  Final   Culture   Final    NO GROWTH 3 DAYS Performed at [facility]    Report Status PENDING  Incomplete  Urine culture     Status: None   Collection Time: 11/15/14  4:25 PM  Result Value Ref Range Status   Specimen Description URINE, CLEAN CATCH  Final   Special Requests NONE  Final   Culture   Final    MULTIPLE SPECIES PRESENT, SUGGEST RECOLLECTION Performed at [facility]    Report Status 11/17/2014 FINAL  Final  Blood culture (routine x 2)     Status: None (Preliminary result)   Collection Time: 11/15/14  6:00 PM  Result Value Ref Range Status   Specimen Description BLOOD RIGHT HAND  Final   Special Requests BOTTLES DRAWN AEROBIC AND ANAEROBIC 5ML EACH  Final   Culture   Final    NO GROWTH 3 DAYS Performed at [facility]    Report Status PENDING  Incomplete    Radiology Reports Dg Chest 2 View  11/17/2014   CLINICAL DATA:  Cough, fever, body aches, weakness, fatigue. Nausea and loss of appetite since Sunday. History of CHF, pneumonia, shingles.  EXAM: CHEST  2 VIEW  COMPARISON:   11/15/2014  FINDINGS: The heart is enlarged. There is interstitial edema. There has been development  of significant right upper lobe and right lower lobe infiltrate, likely infectious. Small bilateral pleural effusions are present.  IMPRESSION: 1. Right upper lobe and right lower lobe infiltrates. 2. Cardiomegaly and mild edema.  Small effusions.   Electronically Signed   By: Nolon Nations M.D.   On: 11/17/2014 16:17   Dg Chest 2 View  11/15/2014   CLINICAL DATA:  Nausea and vomiting for 2 days.  Initial encounter.  EXAM: CHEST  2 VIEW  COMPARISON:  07/10/2012.  FINDINGS: Chronic cardiomegaly. Interstitial pulmonary edema and pulmonary vascular congestion compatible with CHF. No pleural effusions. Thickening of the fissures is present on the lateral projection.  IMPRESSION: Mild CHF.   Electronically Signed   By: Dereck Ligas M.D.   On: 11/15/2014 16:42   Dg Chest Port 1 View  11/18/2014   CLINICAL DATA:  74 year old female with sepsis. History of pneumonia and CHF.  EXAM: PORTABLE CHEST - 1 VIEW  COMPARISON:  Earlier Radiograph dated 11/17/2014 and multiple other radiograph dating back to 07/17/2007  FINDINGS: There is a patchy area of consolidation with air bronchogram in the right mid lung field most compatible with pneumonia. Underlying mass is not excluded. There has been interval progression of opacity compared to the earlier study. Clinical correlation and follow-up resolution recommended. No pleural effusion or pneumothorax. Stable cardiac silhouette. The osseous structures are grossly unremarkable.  IMPRESSION: Slight increase in the right mid lung field airspace opacity. Follow-up recommended.   Electronically Signed   By: Anner Crete M.D.   On: 11/18/2014 00:48     CBC  Recent Labs Lab 11/15/14 1607 11/17/14 1525 11/18/14 0042 11/18/14 0240  WBC 9.8 8.5 7.4 7.6  HGB 11.7* 10.9* 10.2* 10.1*  HCT 35.1* 32.0* 30.0* 29.4*  PLT 212 220 210 214  MCV 81.8 79.4 78.7 78.6  MCH 27.3  27.0 26.8 27.0  MCHC 33.3 34.1 34.0 34.4  RDW 13.4 13.6 13.6 13.6  LYMPHSABS 1.2 0.9  --   --   MONOABS 1.2* 0.8  --   --   EOSABS 0.0 0.0  --   --   BASOSABS 0.0 0.0  --   --     Chemistries   Recent Labs Lab 11/15/14 1607 11/17/14 1525 11/18/14 0042 11/18/14 0240  NA 131* 127*  --  127*  K 3.6 3.7  --  3.6  CL 95* 94*  --  95*  CO2 25 23  --  23  GLUCOSE 128* 123*  --  121*  BUN 17 17  --  11  CREATININE 1.22* 1.17* 0.99 1.02*  CALCIUM 8.4* 7.7*  --  7.3*  AST 23  --   --  52*  ALT 17  --   --  32  ALKPHOS 72  --   --  80  BILITOT 0.6  --   --  0.5   ------------------------------------------------------------------------------------------------------------------ estimated creatinine clearance is 43 mL/min (by C-G formula based on Cr of 1.02). ------------------------------------------------------------------------------------------------------------------ No results for input(s): HGBA1C in the last 72 hours. ------------------------------------------------------------------------------------------------------------------ No results for input(s): CHOL, HDL, LDLCALC, TRIG, CHOLHDL, LDLDIRECT in the last 72 hours. ------------------------------------------------------------------------------------------------------------------  Recent Labs  11/18/14 0042  TSH 2.018   ------------------------------------------------------------------------------------------------------------------  Recent Labs  11/18/14 0042  VITAMINB12 658  FERRITIN 1489*  TIBC 171*  IRON 13*    Coagulation profile  Recent Labs Lab 11/18/14 0042  INR 1.32    No results for input(s): DDIMER in the last 72 hours.  Cardiac Enzymes No results for input(s):  CKMB, TROPONINI, MYOGLOBIN in the last 168 hours.  Invalid input(s): CK ------------------------------------------------------------------------------------------------------------------ Invalid input(s): POCBNP     Time Spent  in minutes   30 minutes   ELGERGAWY, DAWOOD M.D on 11/18/2014 at 4:41 PM  Between 7am to 7pm - Pager - [phone]  After 7pm go to [email or web address] - password [facility]  [facility]   Office  [phone]

## 2014-11-18 NOTE — Progress Notes (Signed)
Utilization review completed. Bertha Stanfill, RN, BSN. 

## 2014-11-18 NOTE — Progress Notes (Signed)
Patient received a bolus of Normal saline, 1L. Upon assessment, patient appeared to be SOB and wheezing. Auscultation of her lungs revealed fine crackles. Patient's BP 160/76, temp: 100.8, RR 20, Pulse 83, and 91% on 2L via nasal cannula. MD notified. MD placed orders for PRN albuterol. Rapid response nurse also notified.MD orders implemented. RRN assessed patient and educates patient on use of incentive spirometer. Patient placed on 3L of oxygen via nasal cannula. Patient o2 sat 97%. RN will continue to monitor patient.  Ermalinda Memos, RN

## 2014-11-18 NOTE — Significant Event (Signed)
Rapid Response Event Note Called to see pt for SOB Overview: Time Called: 0130 Arrival Time: 0133 Event Type: Respiratory  Initial Focused Assessment: On arrival pt is sitting upright in bed, alert and able to answer questions.  She has a strong non-productive cough.  She is able to fluidly tell me the story to over the last week leading up to her admission to the hospital. VS BP 160/76, t=100.8, p 83, RR 21, 91% 2L [place] (increased to 3L [place]).  BS: Lt clear, Rt side with scattered wheezing & diminished t/o.  Obtained an IS & completed teaching in its use.  Pt able to pull 750 x3.  Albuterol neb tx given per order.  Discussed with pt & husband importance of good lung health and pulmonary hygiene.  Pt states she is tired but feeling some better. 97% on 3L. Interventions: Albuterol Neb IS Increased O2 to 3L [place]  Event Summary:   at      at          [facility], April Hedgecock

## 2014-11-18 NOTE — Progress Notes (Signed)
Patient arrived on unit via stretcher. Patient alert and oriented x4. Patient oriented to room, staff and unit. Patient's husband and other relatives at bedside. Skin assessment completed with two RN's. No skin issues noted. IV-clean, dry, and infusing. Safety Fall Prevention Plan was given, discussed and signed by patient. Orders have been reviewed and implemented. Call light has been placed within reach. RN  will continue to monitor the patient.   Nena Polio BSN, RN  Phone Number: [phone]

## 2014-11-19 LAB — BASIC METABOLIC PANEL
Anion gap: 7 (ref 5–15)
BUN: 9 mg/dL (ref 6–20)
CHLORIDE: 102 mmol/L (ref 101–111)
CO2: 24 mmol/L (ref 22–32)
CREATININE: 0.8 mg/dL (ref 0.44–1.00)
Calcium: 7.5 mg/dL — ABNORMAL LOW (ref 8.9–10.3)
GFR calc Af Amer: >60 mL/min (ref >60)
GLUCOSE: 97 mg/dL (ref 65–99)
POTASSIUM: 3.5 mmol/L (ref 3.5–5.1)
Sodium: 133 mmol/L — ABNORMAL LOW (ref 135–145)

## 2014-11-19 LAB — HEMOGLOBIN A1C
HEMOGLOBIN A1C: 6.1 % — AB (ref 4.8–5.6)
Mean Plasma Glucose: 128 mg/dL

## 2014-11-19 LAB — GLUCOSE, CAPILLARY: Glucose-Capillary: 104 mg/dL — ABNORMAL HIGH (ref 65–99)

## 2014-11-19 MED ORDER — WHITE PETROLATUM GEL
Status: AC
  Administered 2014-11-19: 0.2
  Filled 2014-11-19: qty 1

## 2014-11-19 MED ORDER — VANCOMYCIN HCL IN DEXTROSE 750-5 MG/150ML-% IV SOLN
750.0000 mg | Freq: Two times a day (BID) | INTRAVENOUS | Status: DC
  Administered 2014-11-19 – 2014-11-21 (×5): 750 mg via INTRAVENOUS
  Filled 2014-11-19 (×6): qty 150

## 2014-11-19 MED ORDER — PIPERACILLIN-TAZOBACTAM 3.375 G IVPB
3.3750 g | Freq: Three times a day (TID) | INTRAVENOUS | Status: DC
  Administered 2014-11-19 – 2014-11-21 (×6): 3.375 g via INTRAVENOUS
  Filled 2014-11-19 (×8): qty 50

## 2014-11-19 NOTE — Progress Notes (Signed)
Patient Demographics  Amanda [name], is a 74 y.o. female, DOB - 1940/08/27, MAU:[id number]  Admit date - 11/17/2014   Admitting Physician Amanda Sells, MD  Outpatient Primary MD for the patient is Amanda Koyanagi, DO  LOS - 2   Chief Complaint  Patient presents with  . Urinary Tract Infection        Subjective:   Amanda [name] today has, No headache, No chest pain, No abdominal pain - No Nausea, patient complains of shortness of breath, cough, nonproductive, as well as report generalized weakness.  Assessment & Plan    Principal Problem:   CAP (community acquired pneumonia) Active Problems:   Hypothyroidism   HLD (hyperlipidemia)   GERD (gastroesophageal reflux disease)   Community-acquired pneumonia  - Initially on IV Rocephin and azithromycin, no improvement, repeat x-ray showing worsening infiltrate, increased oxygen requirement, will change antibiotics to vancomycin and Zosyn, will add chest PT. - blood cultures : No growth to date  -  urine antigen for strep nonreactive and legionella is pending ,  - sputum culture : No growth to date - Continue with Mucinex, flutter valve, pulmonary toilet   Hypothyroidism - Continue with Synthroid - TSH within normal limits  Hyperlipidemia - Follow on lipid panel   Anemia  - Anemia of chronic illness, iron is 13, ferritin is 1489 , continue to monitor .   hyponatremia - Related to pneumonia, continue  to monitor on IV NS.[name]  Code Status: Full  Family Communication: family at bedside  Disposition Plan: pending PT evaluation when patient is stable.   Procedures  None    Consults   None    Medications  Scheduled Meds: . acidophilus  1 capsule Oral Daily  . antiseptic oral rinse  7 mL Mouth Rinse BID  . buPROPion  300 mg Oral Daily  . guaiFENesin  1,200 mg Oral BID  . heparin  5,000 Units Subcutaneous 3 times per day  .  levothyroxine  125 mcg Oral QAC breakfast  . multivitamin-lutein  1 capsule Oral Daily  . vitamin C  1,000 mg Oral Daily   Continuous Infusions: . sodium chloride 50 mL/hr at 11/18/14 2245   PRN Meds:.acetaminophen **OR** acetaminophen, albuterol, zolpidem  DVT Prophylaxis Heparin   Lab Results  Component Value Date   PLT 214 11/18/2014    Antibiotics    Anti-infectives    Start     Dose/Rate Route Frequency Ordered Stop   11/18/14 1730  azithromycin (ZITHROMAX) tablet 500 mg  Status:  Discontinued     500 mg Oral Every 24 hours 11/17/14 2326 11/19/14 1230   11/18/14 1630  cefTRIAXone (ROCEPHIN) 1 g in dextrose 5 % 50 mL IVPB  Status:  Discontinued     1 g 100 mL/hr over 30 Minutes Intravenous Every 24 hours 11/17/14 2326 11/19/14 1230   11/17/14 2330  cefTRIAXone (ROCEPHIN) 1 g in dextrose 5 % 50 mL IVPB  Status:  Discontinued     1 g 100 mL/hr over 30 Minutes Intravenous Every 24 hours 11/17/14 2318 11/17/14 2326   11/17/14 2330  azithromycin (ZITHROMAX) tablet 500 mg  Status:  Discontinued     500 mg Oral Every 24 hours 11/17/14 2318 11/17/14 2326   11/17/14 1725  azithromycin (  ZITHROMAX) 500 MG injection    Comments:  Amanda [name]   : cabinet override      11/17/14 1725 11/18/14 0529   11/17/14 1630  cefTRIAXone (ROCEPHIN) 1 g in dextrose 5 % 50 mL IVPB     1 g 100 mL/hr over 30 Minutes Intravenous  Once 11/17/14 1626 11/17/14 1724   11/17/14 1630  azithromycin (ZITHROMAX) 500 mg in dextrose 5 % 250 mL IVPB     500 mg 250 mL/hr over 60 Minutes Intravenous  Once 11/17/14 1626 11/17/14 1940   11/17/14 1628  cefTRIAXone (ROCEPHIN) 1 G injection    Comments:  Amanda [name]   : cabinet override      11/17/14 1628 11/18/14 0444          Objective:   Filed Vitals:   11/18/14 2233 11/19/14 0510 11/19/14 0804 11/19/14 0851  BP:  140/60 149/73   Pulse:  89 61   Temp: 98.6 F (37 C) 99.8 F (37.7 C) 99.2 F (37.3 C)   TempSrc: Oral Oral Oral   Resp:  16 19     Height:      Weight:      SpO2:  94% 96% 96%    Wt Readings from Last 3 Encounters:  11/18/14 67.813 kg (149 lb 8 oz)  11/15/14 66.225 kg (146 lb)  06/02/14 69.854 kg (154 lb)     Intake/Output Summary (Last 24 hours) at 11/19/14 1231 Last data filed at 11/19/14 0805  Gross per 24 hour  Intake 1276.17 ml  Output   1600 ml  Net -323.83 ml     Physical Exam  Awake Alert, frail Amanda [name].AT,PERRAL Supple Neck,No JVD, Symmetrical Chest  wall movement , right lung reveals  No Gallops,Rubs or new Murmurs, No Parasternal Heave +ve B.Sounds, Abd Soft, No tenderness, No organomegaly appriciated, No Cyanosis, Clubbing or edema, No new Rash or bruise    Data Review   Micro Results Recent Results (from the past 240 hour(s))  Blood culture (routine x 2)     Status: None (Preliminary result)   Collection Time: 11/15/14  4:07 PM  Result Value Ref Range Status   Specimen Description BLOOD RIGHT ANTECUBITAL  Final   Special Requests BOTTLES DRAWN AEROBIC AND ANAEROBIC 5 ML EACH  Final   Culture   Final    NO GROWTH 3 DAYS Performed at [facility]    Report Status PENDING  Incomplete  Urine culture     Status: None   Collection Time: 11/15/14  4:25 PM  Result Value Ref Range Status   Specimen Description URINE, CLEAN CATCH  Final   Special Requests NONE  Final   Culture   Final    MULTIPLE SPECIES PRESENT, SUGGEST RECOLLECTION Performed at [facility]    Report Status 11/17/2014 FINAL  Final  Blood culture (routine x 2)     Status: None (Preliminary result)   Collection Time: 11/15/14  6:00 PM  Result Value Ref Range Status   Specimen Description BLOOD RIGHT HAND  Final   Special Requests BOTTLES DRAWN AEROBIC AND ANAEROBIC 5ML EACH  Final   Culture   Final    NO GROWTH 3 DAYS Performed at [facility]    Report Status PENDING  Incomplete    Radiology Reports Dg Chest 2 View  11/17/2014   CLINICAL DATA:  Cough, fever, body aches, weakness,  fatigue. Nausea and loss of appetite since Sunday. History of CHF, pneumonia, shingles.  EXAM: CHEST  2  VIEW  COMPARISON:  11/15/2014  FINDINGS: The heart is enlarged. There is interstitial edema. There has been development of significant right upper lobe and right lower lobe infiltrate, likely infectious. Small bilateral pleural effusions are present.  IMPRESSION: 1. Right upper lobe and right lower lobe infiltrates. 2. Cardiomegaly and mild edema.  Small effusions.   Electronically Signed   By: Nolon Nations M.D.   On: 11/17/2014 16:17   Dg Chest 2 View  11/15/2014   CLINICAL DATA:  Nausea and vomiting for 2 days.  Initial encounter.  EXAM: CHEST  2 VIEW  COMPARISON:  07/10/2012.  FINDINGS: Chronic cardiomegaly. Interstitial pulmonary edema and pulmonary vascular congestion compatible with CHF. No pleural effusions. Thickening of the fissures is present on the lateral projection.  IMPRESSION: Mild CHF.   Electronically Signed   By: Dereck Ligas M.D.   On: 11/15/2014 16:42   Dg Chest Port 1 View  11/18/2014   CLINICAL DATA:  74 year old female with sepsis. History of pneumonia and CHF.  EXAM: PORTABLE CHEST - 1 VIEW  COMPARISON:  Earlier Radiograph dated 11/17/2014 and multiple other radiograph dating back to 07/17/2007  FINDINGS: There is a patchy area of consolidation with air bronchogram in the right mid lung field most compatible with pneumonia. Underlying mass is not excluded. There has been interval progression of opacity compared to the earlier study. Clinical correlation and follow-up resolution recommended. No pleural effusion or pneumothorax. Stable cardiac silhouette. The osseous structures are grossly unremarkable.  IMPRESSION: Slight increase in the right mid lung field airspace opacity. Follow-up recommended.   Electronically Signed   By: Anner Crete M.D.   On: 11/18/2014 00:48     CBC  Recent Labs Lab 11/15/14 1607 11/17/14 1525 11/18/14 0042 11/18/14 0240  WBC 9.8 8.5 7.4  7.6  HGB 11.7* 10.9* 10.2* 10.1*  HCT 35.1* 32.0* 30.0* 29.4*  PLT 212 220 210 214  MCV 81.8 79.4 78.7 78.6  MCH 27.3 27.0 26.8 27.0  MCHC 33.3 34.1 34.0 34.4  RDW 13.4 13.6 13.6 13.6  LYMPHSABS 1.2 0.9  --   --   MONOABS 1.2* 0.8  --   --   EOSABS 0.0 0.0  --   --   BASOSABS 0.0 0.0  --   --     Chemistries   Recent Labs Lab 11/15/14 1607 11/17/14 1525 11/18/14 0042 11/18/14 0240 11/19/14 0640  NA 131* 127*  --  127* 133*  K 3.6 3.7  --  3.6 3.5  CL 95* 94*  --  95* 102  CO2 25 23  --  23 24  GLUCOSE 128* 123*  --  121* 97  BUN 17 17  --  11 9  CREATININE 1.22* 1.17* 0.99 1.02* 0.80  CALCIUM 8.4* 7.7*  --  7.3* 7.5*  AST 23  --   --  52*  --   ALT 17  --   --  32  --   ALKPHOS 72  --   --  80  --   BILITOT 0.6  --   --  0.5  --    ------------------------------------------------------------------------------------------------------------------ estimated creatinine clearance is 54.9 mL/min (by C-G formula based on Cr of 0.8). ------------------------------------------------------------------------------------------------------------------  Recent Labs  11/18/14 0057  HGBA1C 6.1*   ------------------------------------------------------------------------------------------------------------------ No results for input(s): CHOL, HDL, LDLCALC, TRIG, CHOLHDL, LDLDIRECT in the last 72 hours. ------------------------------------------------------------------------------------------------------------------  Recent Labs  11/18/14 0042  TSH 2.018   ------------------------------------------------------------------------------------------------------------------  Recent Labs  11/18/14 0042  [id number]  FERRITIN 1489*  TIBC 171*  IRON 13*    Coagulation profile  Recent Labs Lab 11/18/14 0042  INR 1.32    No results for input(s): DDIMER in the last 72 hours.  Cardiac Enzymes No results for input(s): CKMB, TROPONINI, MYOGLOBIN in the last 168  hours.  Invalid input(s): CK ------------------------------------------------------------------------------------------------------------------ Invalid input(s): POCBNP     Time Spent in minutes   30 minutes   ELGERGAWY, DAWOOD M.D on 11/19/2014 at 12:31 PM  Between 7am to 7pm - Pager - [phone]  After 7pm go to [email or web address] - password [facility]  [facility]   Office  [phone]

## 2014-11-19 NOTE — Progress Notes (Signed)
ANTIBIOTIC CONSULT NOTE - INITIAL  Pharmacy Consult for vancomycin and Zosyn Indication: pneumonia  Allergies  Allergen Reactions  . Phenergan [Promethazine Hcl] Other (See Comments)    Jerking/agitation  . Levofloxacin Nausea And Vomiting  . Pravastatin Sodium Nausea And Vomiting    Patient Measurements: Height: 5\' 2"  (157.5 cm) Weight: 149 lb 8 oz (67.813 kg) IBW/kg (Calculated) : 50.1  Vital Signs: Temp: 99.2 F (37.3 C) (09/17 0804) Temp Source: Oral (09/17 0804) BP: 149/73 mmHg (09/17 0804) Pulse Rate: 61 (09/17 0804) Intake/Output from previous day: 09/16 0701 - 09/17 0700 In: 1276.2 [P.O.:582; I.V.:644.2; IV Piggyback:50] Out: 600 [Urine:600] Intake/Output from this shift: Total I/O In: -  Out: 1000 [Urine:1000]  Labs:  Recent Labs  11/17/14 1525 11/18/14 0042 11/18/14 0240 11/19/14 0640  WBC 8.5 7.4 7.6  --   HGB 10.9* 10.2* 10.1*  --   PLT 220 210 214  --   CREATININE 1.17* 0.99 1.02* 0.80   Estimated Creatinine Clearance: 54.9 mL/min (by C-G formula based on Cr of 0.8). No results for input(s): VANCOTROUGH, VANCOPEAK, VANCORANDOM, GENTTROUGH, GENTPEAK, GENTRANDOM, TOBRATROUGH, TOBRAPEAK, TOBRARND, AMIKACINPEAK, AMIKACINTROU, AMIKACIN in the last 72 hours.   Microbiology: Recent Results (from the past 720 hour(s))  Blood culture (routine x 2)     Status: None (Preliminary result)   Collection Time: 11/15/14  4:07 PM  Result Value Ref Range Status   Specimen Description BLOOD RIGHT ANTECUBITAL  Final   Special Requests BOTTLES DRAWN AEROBIC AND ANAEROBIC 5 ML EACH  Final   Culture   Final    NO GROWTH 3 DAYS Performed at [facility]    Report Status PENDING  Incomplete  Urine culture     Status: None   Collection Time: 11/15/14  4:25 PM  Result Value Ref Range Status   Specimen Description URINE, CLEAN CATCH  Final   Special Requests NONE  Final   Culture   Final    MULTIPLE SPECIES PRESENT, SUGGEST RECOLLECTION Performed at [facility]    Report Status 11/17/2014 FINAL  Final  Blood culture (routine x 2)     Status: None (Preliminary result)   Collection Time: 11/15/14  6:00 PM  Result Value Ref Range Status   Specimen Description BLOOD RIGHT HAND  Final   Special Requests BOTTLES DRAWN AEROBIC AND ANAEROBIC 5ML EACH  Final   Culture   Final    NO GROWTH 3 DAYS Performed at [facility]    Report Status PENDING  Incomplete    Medical History: Past Medical History  Diagnosis Date  . Hypothyroidism   . Hyperlipidemia   . Constipation, chronic   . PONV (postoperative nausea and vomiting)     needs little anesthesia  . GERD (gastroesophageal reflux disease)     zantac  . History of blood transfusion [place]  . Shortness of breath     on exertion  . SUI (stress urinary incontinence, female)   . Shingles   . Macular degeneration 2013    Both eyes   . Pneumonia   . CHF (congestive heart failure)     Medications:  Prescriptions prior to admission  Medication Sig Dispense Refill Last Dose  . Ascorbic Acid (VITAMIN C CR) 1000 MG TBCR Take 1 each by mouth daily. EMERGEN-C PACKET   Past Week at Unknown time  . buPROPion (WELLBUTRIN XL) 300 MG 24 hr tablet Take 1 tablet (300 mg total) by mouth daily. 90 tablet 3  Past Week at Unknown time  . levothyroxine (SYNTHROID, LEVOTHROID) 125 MCG tablet Take 1 tablet (125 mcg total) by mouth daily before breakfast. Repeat labs are due now 90 tablet 0 Past Week at Unknown time  . Multiple Vitamins-Minerals (ICAPS) TABS Take 2 tablets by mouth daily. AREDS 1+2   Past Week at Unknown time  . ondansetron (ZOFRAN) 4 MG tablet Take 1 tablet (4 mg total) by mouth every 8 (eight) hours as needed for nausea or vomiting. 12 tablet 0 Past Week at Unknown time  . Probiotic Product (PROBIOTIC DAILY PO) Take 1 capsule by mouth daily.    Past Week at Unknown time   Assessment: 74 year old woman admitted for CAP, not improving after 2 days of ceftriaxone  and azithromycin.  Antibiotics to be changed to vancomycin and Zosyn.  Goal of Therapy:  Vancomycin trough level 15-20 mcg/ml  Plan:  Expected duration 7 days with resolution of temperature and/or normalization of WBC Measure antibiotic drug levels at steady state Follow up culture results Vancomycin 750mg  IV q12h  Zosyn 3.375g IV q8h (infuse over 4 hours)   Candie Mile 11/19/2014,1:01 PM

## 2014-11-20 ENCOUNTER — Inpatient Hospital Stay (HOSPITAL_COMMUNITY): Payer: Medicare Other

## 2014-11-20 DIAGNOSIS — J9601 Acute respiratory failure with hypoxia: Secondary | ICD-10-CM

## 2014-11-20 LAB — BASIC METABOLIC PANEL
ANION GAP: 7 (ref 5–15)
BUN: 8 mg/dL (ref 6–20)
CALCIUM: 7.9 mg/dL — AB (ref 8.9–10.3)
CO2: 27 mmol/L (ref 22–32)
Chloride: 102 mmol/L (ref 101–111)
Creatinine, Ser: 0.78 mg/dL (ref 0.44–1.00)
GFR calc Af Amer: >60 mL/min (ref >60)
GLUCOSE: 108 mg/dL — AB (ref 65–99)
Potassium: 3.6 mmol/L (ref 3.5–5.1)
Sodium: 136 mmol/L (ref 135–145)

## 2014-11-20 LAB — CULTURE, BLOOD (ROUTINE X 2)
CULTURE: NO GROWTH
Culture: NO GROWTH

## 2014-11-20 LAB — GLUCOSE, CAPILLARY: Glucose-Capillary: 103 mg/dL — ABNORMAL HIGH (ref 65–99)

## 2014-11-20 LAB — CBC
HEMATOCRIT: 28.5 % — AB (ref 36.0–46.0)
Hemoglobin: 9.6 g/dL — ABNORMAL LOW (ref 12.0–15.0)
MCH: 26.6 pg (ref 26.0–34.0)
MCHC: 33.7 g/dL (ref 30.0–36.0)
MCV: 78.9 fL (ref 78.0–100.0)
PLATELETS: 305 10*3/uL (ref 150–400)
RBC: 3.61 MIL/uL — ABNORMAL LOW (ref 3.87–5.11)
RDW: 14.5 % (ref 11.5–15.5)
WBC: 8 10*3/uL (ref 4.0–10.5)

## 2014-11-20 MED ORDER — ONDANSETRON HCL 4 MG/2ML IJ SOLN
4.0000 mg | Freq: Four times a day (QID) | INTRAMUSCULAR | Status: DC | PRN
  Administered 2014-11-21 – 2014-11-23 (×3): 4 mg via INTRAVENOUS
  Filled 2014-11-20 (×4): qty 2

## 2014-11-20 MED ORDER — ONDANSETRON HCL 4 MG/2ML IJ SOLN: 4.0000 mg | Freq: Four times a day (QID) | INTRAMUSCULAR | Status: DC

## 2014-11-20 NOTE — Progress Notes (Addendum)
Patient Demographics  Amanda [name], is a 74 y.o. female, DOB - [birth year], YPP:[id number]  Admit date - 11/17/2014   Admitting Physician Nita Sells, MD  Outpatient Primary MD for the patient is [name], DO  LOS - 3   Chief Complaint  Patient presents with  . Urinary Tract Infection        Subjective:   [name] today has, No headache, No chest pain, No abdominal pain - No Nausea, patient complains of shortness of breath, cough, nonproductive, as well as report generalized weakness.  Assessment & Plan    Principal Problem:   CAP (community acquired pneumonia) Active Problems:   Hypothyroidism   HLD (hyperlipidemia)   GERD (gastroesophageal reflux disease)  Acute hypoxic respiratory failure - Secondary to pneumonia  Community-acquired pneumonia  - Initially on IV Rocephin and azithromycin, no improvement, repeat x-ray showing worsening infiltrate, had increased oxygen requirement, so changed antibiotics to vancomycin and Zosyn on 9/17. - blood cultures : No growth to date  -  urine antigen for strep nonreactive and legionella is pending ,  - follow on sputum culture. - Continue with Mucinex, flutter valve, pulmonary toilet , chest PT.  Hypothyroidism - Continue with Synthroid - TSH within normal limits  Hyperlipidemia - Follow on lipid panel   Anemia  - Anemia of chronic illness, iron is 13, ferritin is 1489 , continue to monitor .   hyponatremia - Related to pneumonia, resolved with IV normal saline.  Code Status: Full  Family Communication: Husband at bedside  Disposition Plan: pending PT evaluation when patient is stable.   Procedures  None    Consults   None    Medications  Scheduled Meds: . acidophilus  1 capsule Oral Daily  . antiseptic oral rinse  7 mL Mouth Rinse BID  . buPROPion  300 mg Oral Daily  . guaiFENesin  1,200 mg Oral BID  .  heparin  5,000 Units Subcutaneous 3 times per day  . levothyroxine  125 mcg Oral QAC breakfast  . multivitamin-lutein  1 capsule Oral Daily  . piperacillin-tazobactam (ZOSYN)  IV  3.375 g Intravenous 3 times per day  . vancomycin  750 mg Intravenous Q12H  . vitamin C  1,000 mg Oral Daily   Continuous Infusions:   PRN Meds:.acetaminophen **OR** acetaminophen, albuterol, ondansetron (ZOFRAN) IV, zolpidem  DVT Prophylaxis Heparin   Lab Results  Component Value Date   PLT 305 11/20/2014    Antibiotics    Anti-infectives    Start     Dose/Rate Route Frequency Ordered Stop   11/19/14 1400  piperacillin-tazobactam (ZOSYN) IVPB 3.375 g     3.375 g 12.5 mL/hr over 240 Minutes Intravenous 3 times per day 11/19/14 1306     11/19/14 1330  vancomycin (VANCOCIN) IVPB 750 mg/150 ml premix     750 mg 150 mL/hr over 60 Minutes Intravenous Every 12 hours 11/19/14 1306     11/18/14 1730  azithromycin (ZITHROMAX) tablet 500 mg  Status:  Discontinued     500 mg Oral Every 24 hours 11/17/14 2326 11/19/14 1230   11/18/14 1630  cefTRIAXone (ROCEPHIN) 1 g in dextrose 5 % 50 mL IVPB  Status:  Discontinued     1 g 100 mL/hr over 30  Minutes Intravenous Every 24 hours 11/17/14 2326 11/19/14 1230   11/17/14 2330  cefTRIAXone (ROCEPHIN) 1 g in dextrose 5 % 50 mL IVPB  Status:  Discontinued     1 g 100 mL/hr over 30 Minutes Intravenous Every 24 hours 11/17/14 2318 11/17/14 2326   11/17/14 2330  azithromycin (ZITHROMAX) tablet 500 mg  Status:  Discontinued     500 mg Oral Every 24 hours 11/17/14 2318 11/17/14 2326   11/17/14 1725  azithromycin (ZITHROMAX) 500 MG injection    Comments:  Eulogio Ditch   : cabinet override      11/17/14 1725 11/18/14 0529   11/17/14 1630  cefTRIAXone (ROCEPHIN) 1 g in dextrose 5 % 50 mL IVPB     1 g 100 mL/hr over 30 Minutes Intravenous  Once 11/17/14 1626 11/17/14 1724   11/17/14 1630  azithromycin (ZITHROMAX) 500 mg in dextrose 5 % 250 mL IVPB     500 mg 250 mL/hr over  60 Minutes Intravenous  Once 11/17/14 1626 11/17/14 1940   11/17/14 1628  cefTRIAXone (ROCEPHIN) 1 G injection    Comments:  Eulogio Ditch   : cabinet override      11/17/14 1628 11/18/14 0444          Objective:   Filed Vitals:   11/19/14 1554 11/19/14 2021 11/20/14 0510 11/20/14 0749  BP: 149/72 141/70 152/75 151/67  Pulse: 80 71 74 79  Temp: 99.8 F (37.7 C) 98.9 F (37.2 C) 98.8 F (37.1 C) 99.3 F (37.4 C)  TempSrc: Oral Oral Oral Oral  Resp: 18 20 18 18   Height:      Weight:      SpO2: 98% 97% 98% 98%    Wt Readings from Last 3 Encounters:  11/18/14 67.813 kg (149 lb 8 oz)  11/15/14 66.225 kg (146 lb)  06/02/14 69.854 kg (154 lb)     Intake/Output Summary (Last 24 hours) at 11/20/14 1354 Last data filed at 11/20/14 1308  Gross per 24 hour  Intake   3140 ml  Output   3750 ml  Net   -610 ml     Physical Exam  Awake Alert, frail Roy.AT,PERRAL Supple Neck,No JVD, Symmetrical Chest  wall movement , right lung reveals  No Gallops,Rubs or new Murmurs, No Parasternal Heave +ve B.Sounds, Abd Soft, No tenderness, No organomegaly appriciated, No Cyanosis, Clubbing or edema, No new Rash or bruise    Data Review   Micro Results Recent Results (from the past 240 hour(s))  Blood culture (routine x 2)     Status: None   Collection Time: 11/15/14  4:07 PM  Result Value Ref Range Status   Specimen Description BLOOD RIGHT ANTECUBITAL  Final   Special Requests BOTTLES DRAWN AEROBIC AND ANAEROBIC 5 ML EACH  Final   Culture   Final    NO GROWTH 5 DAYS Performed at [facility]    Report Status 11/20/2014 FINAL  Final  Urine culture     Status: None   Collection Time: 11/15/14  4:25 PM  Result Value Ref Range Status   Specimen Description URINE, CLEAN CATCH  Final   Special Requests NONE  Final   Culture   Final    MULTIPLE SPECIES PRESENT, SUGGEST RECOLLECTION Performed at [facility]    Report Status 11/17/2014 FINAL  Final  Blood culture  (routine x 2)     Status: None   Collection Time: 11/15/14  6:00 PM  Result Value Ref Range Status  Specimen Description BLOOD RIGHT HAND  Final   Special Requests BOTTLES DRAWN AEROBIC AND ANAEROBIC 5ML EACH  Final   Culture   Final    NO GROWTH 5 DAYS Performed at [facility]    Report Status 11/20/2014 FINAL  Final  Culture, blood (routine x 2) Call MD if unable to obtain prior to antibiotics being given     Status: None (Preliminary result)   Collection Time: 11/18/14 12:42 AM  Result Value Ref Range Status   Specimen Description BLOOD RIGHT ANTECUBITAL  Final   Special Requests BOTTLES DRAWN AEROBIC ONLY 5CC  Final   Culture NO GROWTH 2 DAYS  Final   Report Status PENDING  Incomplete  Culture, blood (routine x 2) Call MD if unable to obtain prior to antibiotics being given     Status: None (Preliminary result)   Collection Time: 11/18/14 12:57 AM  Result Value Ref Range Status   Specimen Description BLOOD RIGHT ANTECUBITAL  Final   Special Requests IN PEDIATRIC BOTTLE 3CC  Final   Culture NO GROWTH 2 DAYS  Final   Report Status PENDING  Incomplete    Radiology Reports Dg Chest 2 View  11/17/2014   CLINICAL DATA:  Cough, fever, body aches, weakness, fatigue. Nausea and loss of appetite since Sunday. History of CHF, pneumonia, shingles.  EXAM: CHEST  2 VIEW  COMPARISON:  11/15/2014  FINDINGS: The heart is enlarged. There is interstitial edema. There has been development of significant right upper lobe and right lower lobe infiltrate, likely infectious. Small bilateral pleural effusions are present.  IMPRESSION: 1. Right upper lobe and right lower lobe infiltrates. 2. Cardiomegaly and mild edema.  Small effusions.   Electronically Signed   By: Nolon Nations M.D.   On: 11/17/2014 16:17   Dg Chest 2 View  11/15/2014   CLINICAL DATA:  Nausea and vomiting for 2 days.  Initial encounter.  EXAM: CHEST  2 VIEW  COMPARISON:  07/10/2012.  FINDINGS: Chronic cardiomegaly.  Interstitial pulmonary edema and pulmonary vascular congestion compatible with CHF. No pleural effusions. Thickening of the fissures is present on the lateral projection.  IMPRESSION: Mild CHF.   Electronically Signed   By: Dereck Ligas M.D.   On: 11/15/2014 16:42   Dg Chest Port 1 View  11/20/2014   CLINICAL DATA:  Hypoxia  EXAM: PORTABLE CHEST - 1 VIEW  COMPARISON:  11/17/2014  FINDINGS: Cardiomediastinal silhouette is stable. Persistent hazy opacification in right upper and right lower lobe highly suspicious for asymmetric pneumonia or edema. Minimal left basilar atelectasis. Degenerative changes thoracic spine.  IMPRESSION: Persistent hazy opacification right upper and right lower lobe highly suspicious for asymmetric edema or pneumonia. Mild left basilar atelectasis. Degenerative changes thoracic spine.   Electronically Signed   By: Lahoma Crocker M.D.   On: 11/20/2014 10:46   Dg Chest Port 1 View  11/18/2014   CLINICAL DATA:  74 year old female with sepsis. History of pneumonia and CHF.  EXAM: PORTABLE CHEST - 1 VIEW  COMPARISON:  Earlier Radiograph dated 11/17/2014 and multiple other radiograph dating back to 07/17/2007  FINDINGS: There is a patchy area of consolidation with air bronchogram in the right mid lung field most compatible with pneumonia. Underlying mass is not excluded. There has been interval progression of opacity compared to the earlier study. Clinical correlation and follow-up resolution recommended. No pleural effusion or pneumothorax. Stable cardiac silhouette. The osseous structures are grossly unremarkable.  IMPRESSION: Slight increase in the right mid lung field airspace opacity.  Follow-up recommended.   Electronically Signed   By: Anner Crete M.D.   On: 11/18/2014 00:48     CBC  Recent Labs Lab 11/15/14 1607 11/17/14 1525 11/18/14 0042 11/18/14 0240 11/20/14 0612  WBC 9.8 8.5 7.4 7.6 8.0  HGB 11.7* 10.9* 10.2* 10.1* 9.6*  HCT 35.1* 32.0* 30.0* 29.4* 28.5*  PLT  212 220 210 214 305  MCV 81.8 79.4 78.7 78.6 78.9  MCH 27.3 27.0 26.8 27.0 26.6  MCHC 33.3 34.1 34.0 34.4 33.7  RDW 13.4 13.6 13.6 13.6 14.5  LYMPHSABS 1.2 0.9  --   --   --   MONOABS 1.2* 0.8  --   --   --   EOSABS 0.0 0.0  --   --   --   BASOSABS 0.0 0.0  --   --   --     Chemistries   Recent Labs Lab 11/15/14 1607 11/17/14 1525 11/18/14 0042 11/18/14 0240 11/19/14 0640 11/20/14 0612  NA 131* 127*  --  127* 133* 136  K 3.6 3.7  --  3.6 3.5 3.6  CL 95* 94*  --  95* 102 102  CO2 25 23  --  23 24 27   GLUCOSE 128* 123*  --  121* 97 108*  BUN 17 17  --  11 9 8   CREATININE 1.22* 1.17* 0.99 1.02* 0.80 0.78  CALCIUM 8.4* 7.7*  --  7.3* 7.5* 7.9*  AST 23  --   --  52*  --   --   ALT 17  --   --  32  --   --   ALKPHOS 72  --   --  80  --   --   BILITOT 0.6  --   --  0.5  --   --    ------------------------------------------------------------------------------------------------------------------ estimated creatinine clearance is 54.9 mL/min (by C-G formula based on Cr of 0.78). ------------------------------------------------------------------------------------------------------------------  Recent Labs  11/18/14 0057  HGBA1C 6.1*   ------------------------------------------------------------------------------------------------------------------ No results for input(s): CHOL, HDL, LDLCALC, TRIG, CHOLHDL, LDLDIRECT in the last 72 hours. ------------------------------------------------------------------------------------------------------------------  Recent Labs  11/18/14 0042  TSH 2.018   ------------------------------------------------------------------------------------------------------------------  Recent Labs  11/18/14 0042  VITAMINB12 658  FERRITIN 1489*  TIBC 171*  IRON 13*    Coagulation profile  Recent Labs Lab 11/18/14 0042  INR 1.32    No results for input(s): DDIMER in the last 72 hours.  Cardiac Enzymes No results for input(s): CKMB,  TROPONINI, MYOGLOBIN in the last 168 hours.  Invalid input(s): CK ------------------------------------------------------------------------------------------------------------------ Invalid input(s): POCBNP     Time Spent in minutes   30 minutes   ELGERGAWY, DAWOOD M.D on 11/20/2014 at 1:54 PM  Between 7am to 7pm - Pager - [phone]  After 7pm go to [email or web address] - password [email or web address]  [facility]   Office  [phone]

## 2014-11-21 LAB — FOLATE RBC
FOLATE, RBC: 1401 ng/mL (ref >498)
Folate, Hemolysate: 417.6 ng/mL
HEMATOCRIT: 29.8 % — AB (ref 34.0–46.6)

## 2014-11-21 LAB — GLUCOSE, CAPILLARY: GLUCOSE-CAPILLARY: 98 mg/dL (ref 65–99)

## 2014-11-21 MED ORDER — HYDRALAZINE HCL 20 MG/ML IJ SOLN: 5.0000 mg | Freq: Four times a day (QID) | INTRAMUSCULAR | Status: DC | PRN

## 2014-11-21 MED ORDER — POTASSIUM CHLORIDE CRYS ER 20 MEQ PO TBCR
40.0000 meq | EXTENDED_RELEASE_TABLET | Freq: Once | ORAL | Status: AC
  Administered 2014-11-21: 40 meq via ORAL
  Filled 2014-11-21: qty 2

## 2014-11-21 MED ORDER — MAGIC MOUTHWASH W/LIDOCAINE
5.0000 mL | Freq: Three times a day (TID) | ORAL | Status: DC | PRN
  Administered 2014-11-21 – 2014-11-22 (×3): 5 mL via ORAL
  Filled 2014-11-21 (×3): qty 5

## 2014-11-21 MED ORDER — FUROSEMIDE 10 MG/ML IJ SOLN
40.0000 mg | Freq: Once | INTRAMUSCULAR | Status: AC
  Administered 2014-11-21: 40 mg via INTRAVENOUS
  Filled 2014-11-21: qty 4

## 2014-11-21 MED ORDER — LEVOFLOXACIN IN D5W 750 MG/150ML IV SOLN
750.0000 mg | INTRAVENOUS | Status: DC
  Administered 2014-11-21 – 2014-11-22 (×2): 750 mg via INTRAVENOUS
  Filled 2014-11-21 (×2): qty 150

## 2014-11-21 NOTE — Progress Notes (Signed)
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    Nurse who received alert:  Larena Glassman, RN  MD notified (1st page):  Dr. Waldron Labs; told in person  Time of first page:  1300  MD notified (2nd page):  Time of second page:  Responding MD:  Dr. Waldron Labs  Time MD responded:  1300

## 2014-11-21 NOTE — Progress Notes (Addendum)
Patient Demographics  Amanda [name], is a 74 y.o. female, DOB - 05-15-1940, BBC:[id number]  Admit date - 11/17/2014   Admitting Physician Nita Sells, MD  Outpatient Primary MD for the patient is [name], DO  LOS - 4   Chief Complaint  Patient presents with  . Urinary Tract Infection        Subjective:   [name] today has, No headache, No chest pain, No abdominal pain - No Nausea, patient complains of shortness of breath, cough, nonproductive, as well as report generalized weakness.  Assessment & Plan    Principal Problem:   CAP (community acquired pneumonia) Active Problems:   Hypothyroidism   HLD (hyperlipidemia)   GERD (gastroesophageal reflux disease)   Acute respiratory failure with hypoxia  Acute hypoxic respiratory failure - Secondary to pneumonia - We'll wean oxygen as tolerated - Will give one dose of Lasix today, repeat his x-ray in a.m.[name]  Community-acquired pneumonia  - Initially on IV Rocephin and azithromycin, no improvement, repeat x-ray showing worsening infiltrate, had increased oxygen requirement, so changed antibiotics to vancomycin and Zosyn on 9/17. - blood cultures : No growth to date  -  urine antigen for strep nonreactive and legionella is pending ,  - Continue with Mucinex, flutter valve, pulmonary toilet , chest PT .  Hypothyroidism - Continue with Synthroid - TSH within normal limits  Hyperlipidemia - Follow on lipid panel   Hypertension - Blood pressure is on the higher side, patient with no history of hypertension, will start on when necessary hydralazine.  Anemia  - Anemia of chronic illness, iron is 13, ferritin is 1489 , continue to monitor .   hyponatremia - Related to pneumonia, resolved with IV normal saline.  Code Status: Full  Family Communication: Husband at bedside  Disposition Plan: pending PT evaluation when patient  is stable.   Procedures  None    Consults   None    Medications  Scheduled Meds: . acidophilus  1 capsule Oral Daily  . antiseptic oral rinse  7 mL Mouth Rinse BID  . buPROPion  300 mg Oral Daily  . furosemide  40 mg Intravenous Once  . guaiFENesin  1,200 mg Oral BID  . heparin  5,000 Units Subcutaneous 3 times per day  . levothyroxine  125 mcg Oral QAC breakfast  . multivitamin-lutein  1 capsule Oral Daily  . piperacillin-tazobactam (ZOSYN)  IV  3.375 g Intravenous 3 times per day  . potassium chloride  40 mEq Oral Once  . vancomycin  750 mg Intravenous Q12H  . vitamin C  1,000 mg Oral Daily   Continuous Infusions:   PRN Meds:.acetaminophen **OR** acetaminophen, albuterol, ondansetron (ZOFRAN) IV, zolpidem  DVT Prophylaxis Heparin   Lab Results  Component Value Date   PLT 305 11/20/2014    Antibiotics    Anti-infectives    Start     Dose/Rate Route Frequency Ordered Stop   11/19/14 1400  piperacillin-tazobactam (ZOSYN) IVPB 3.375 g     3.375 g 12.5 mL/hr over 240 Minutes Intravenous 3 times per day 11/19/14 1306     11/19/14 1330  vancomycin (VANCOCIN) IVPB 750 mg/150 ml premix     750 mg 150 mL/hr over 60 Minutes Intravenous Every 12 hours 11/19/14  1306     11/18/14 1730  azithromycin (ZITHROMAX) tablet 500 mg  Status:  Discontinued     500 mg Oral Every 24 hours 11/17/14 2326 11/19/14 1230   11/18/14 1630  cefTRIAXone (ROCEPHIN) 1 g in dextrose 5 % 50 mL IVPB  Status:  Discontinued     1 g 100 mL/hr over 30 Minutes Intravenous Every 24 hours 11/17/14 2326 11/19/14 1230   11/17/14 2330  cefTRIAXone (ROCEPHIN) 1 g in dextrose 5 % 50 mL IVPB  Status:  Discontinued     1 g 100 mL/hr over 30 Minutes Intravenous Every 24 hours 11/17/14 2318 11/17/14 2326   11/17/14 2330  azithromycin (ZITHROMAX) tablet 500 mg  Status:  Discontinued     500 mg Oral Every 24 hours 11/17/14 2318 11/17/14 2326   11/17/14 1725  azithromycin (ZITHROMAX) 500 MG injection    Comments:   Eulogio Ditch   : cabinet override      11/17/14 1725 11/18/14 0529   11/17/14 1630  cefTRIAXone (ROCEPHIN) 1 g in dextrose 5 % 50 mL IVPB     1 g 100 mL/hr over 30 Minutes Intravenous  Once 11/17/14 1626 11/17/14 1724   11/17/14 1630  azithromycin (ZITHROMAX) 500 mg in dextrose 5 % 250 mL IVPB     500 mg 250 mL/hr over 60 Minutes Intravenous  Once 11/17/14 1626 11/17/14 1940   11/17/14 1628  cefTRIAXone (ROCEPHIN) 1 G injection    Comments:  Eulogio Ditch   : cabinet override      11/17/14 1628 11/18/14 0444          Objective:   Filed Vitals:   11/20/14 0749 11/20/14 1715 11/20/14 2100 11/21/14 0502  BP: 151/67 153/61 166/77 160/72  Pulse: 79 68 72 72  Temp: 99.3 F (37.4 C) 98.9 F (37.2 C) 98.7 F (37.1 C) 98.3 F (36.8 C)  TempSrc: Oral Oral Oral Oral  Resp: 18 18 20 18   Height:      Weight:   68.5 kg (151 lb 0.2 oz)   SpO2: 98% 98% 99% 99%    Wt Readings from Last 3 Encounters:  11/20/14 68.5 kg (151 lb 0.2 oz)  11/15/14 66.225 kg (146 lb)  06/02/14 69.854 kg (154 lb)     Intake/Output Summary (Last 24 hours) at 11/21/14 1219 Last data filed at 11/21/14 0600  Gross per 24 hour  Intake    690 ml  Output   1100 ml  Net   -410 ml     Physical Exam  Awake Alert, frail [place].AT,PERRAL Supple Neck,No JVD, Symmetrical Chest  wall movement , right lung reveals  No Gallops,Rubs or new Murmurs, No Parasternal Heave +ve B.Sounds, Abd Soft, No tenderness, No organomegaly appriciated, No Cyanosis, Clubbing or edema, No new Rash or bruise    Data Review   Micro Results Recent Results (from the past 240 hour(s))  Blood culture (routine x 2)     Status: None   Collection Time: 11/15/14  4:07 PM  Result Value Ref Range Status   Specimen Description BLOOD RIGHT ANTECUBITAL  Final   Special Requests BOTTLES DRAWN AEROBIC AND ANAEROBIC 5 ML EACH  Final   Culture   Final    NO GROWTH 5 DAYS Performed at [facility]    Report Status 11/20/2014 FINAL   Final  Urine culture     Status: None   Collection Time: 11/15/14  4:25 PM  Result Value Ref Range Status   Specimen Description URINE,  CLEAN CATCH  Final   Special Requests NONE  Final   Culture   Final    MULTIPLE SPECIES PRESENT, SUGGEST RECOLLECTION Performed at [facility]    Report Status 11/17/2014 FINAL  Final  Blood culture (routine x 2)     Status: None   Collection Time: 11/15/14  6:00 PM  Result Value Ref Range Status   Specimen Description BLOOD RIGHT HAND  Final   Special Requests BOTTLES DRAWN AEROBIC AND ANAEROBIC 5ML EACH  Final   Culture   Final    NO GROWTH 5 DAYS Performed at [facility]    Report Status 11/20/2014 FINAL  Final  Culture, blood (routine x 2) Call MD if unable to obtain prior to antibiotics being given     Status: None (Preliminary result)   Collection Time: 11/18/14 12:42 AM  Result Value Ref Range Status   Specimen Description BLOOD RIGHT ANTECUBITAL  Final   Special Requests BOTTLES DRAWN AEROBIC ONLY 5CC  Final   Culture NO GROWTH 2 DAYS  Final   Report Status PENDING  Incomplete  Culture, blood (routine x 2) Call MD if unable to obtain prior to antibiotics being given     Status: None (Preliminary result)   Collection Time: 11/18/14 12:57 AM  Result Value Ref Range Status   Specimen Description BLOOD RIGHT ANTECUBITAL  Final   Special Requests IN PEDIATRIC BOTTLE 3CC  Final   Culture NO GROWTH 2 DAYS  Final   Report Status PENDING  Incomplete    Radiology Reports Dg Chest 2 View  11/17/2014   CLINICAL DATA:  Cough, fever, body aches, weakness, fatigue. Nausea and loss of appetite since Sunday. History of CHF, pneumonia, shingles.  EXAM: CHEST  2 VIEW  COMPARISON:  11/15/2014  FINDINGS: The heart is enlarged. There is interstitial edema. There has been development of significant right upper lobe and right lower lobe infiltrate, likely infectious. Small bilateral pleural effusions are present.  IMPRESSION: 1. Right upper  lobe and right lower lobe infiltrates. 2. Cardiomegaly and mild edema.  Small effusions.   Electronically Signed   By: Nolon Nations M.D.   On: 11/17/2014 16:17   Dg Chest 2 View  11/15/2014   CLINICAL DATA:  Nausea and vomiting for 2 days.  Initial encounter.  EXAM: CHEST  2 VIEW  COMPARISON:  07/10/2012.  FINDINGS: Chronic cardiomegaly. Interstitial pulmonary edema and pulmonary vascular congestion compatible with CHF. No pleural effusions. Thickening of the fissures is present on the lateral projection.  IMPRESSION: Mild CHF.   Electronically Signed   By: Dereck Ligas M.D.   On: 11/15/2014 16:42   Dg Chest Port 1 View  11/20/2014   CLINICAL DATA:  Hypoxia  EXAM: PORTABLE CHEST - 1 VIEW  COMPARISON:  11/17/2014  FINDINGS: Cardiomediastinal silhouette is stable. Persistent hazy opacification in right upper and right lower lobe highly suspicious for asymmetric pneumonia or edema. Minimal left basilar atelectasis. Degenerative changes thoracic spine.  IMPRESSION: Persistent hazy opacification right upper and right lower lobe highly suspicious for asymmetric edema or pneumonia. Mild left basilar atelectasis. Degenerative changes thoracic spine.   Electronically Signed   By: Lahoma Crocker M.D.   On: 11/20/2014 10:46   Dg Chest Port 1 View  11/18/2014   CLINICAL DATA:  74 year old female with sepsis. History of pneumonia and CHF.  EXAM: PORTABLE CHEST - 1 VIEW  COMPARISON:  Earlier Radiograph dated 11/17/2014 and multiple other radiograph dating back to 07/17/2007  FINDINGS: There is  a patchy area of consolidation with air bronchogram in the right mid lung field most compatible with pneumonia. Underlying mass is not excluded. There has been interval progression of opacity compared to the earlier study. Clinical correlation and follow-up resolution recommended. No pleural effusion or pneumothorax. Stable cardiac silhouette. The osseous structures are grossly unremarkable.  IMPRESSION: Slight increase in the  right mid lung field airspace opacity. Follow-up recommended.   Electronically Signed   By: Anner Crete M.D.   On: 11/18/2014 00:48     CBC  Recent Labs Lab 11/15/14 1607 11/17/14 1525 11/18/14 0042 11/18/14 0240 11/20/14 0612  WBC 9.8 8.5 7.4 7.6 8.0  HGB 11.7* 10.9* 10.2* 10.1* 9.6*  HCT 35.1* 32.0* 30.0* 29.4* 28.5*  PLT 212 220 210 214 305  MCV 81.8 79.4 78.7 78.6 78.9  MCH 27.3 27.0 26.8 27.0 26.6  MCHC 33.3 34.1 34.0 34.4 33.7  RDW 13.4 13.6 13.6 13.6 14.5  LYMPHSABS 1.2 0.9  --   --   --   MONOABS 1.2* 0.8  --   --   --   EOSABS 0.0 0.0  --   --   --   BASOSABS 0.0 0.0  --   --   --     Chemistries   Recent Labs Lab 11/15/14 1607 11/17/14 1525 11/18/14 0042 11/18/14 0240 11/19/14 0640 11/20/14 0612  NA 131* 127*  --  127* 133* 136  K 3.6 3.7  --  3.6 3.5 3.6  CL 95* 94*  --  95* 102 102  CO2 25 23  --  23 24 27   GLUCOSE 128* 123*  --  121* 97 108*  BUN 17 17  --  11 9 8   CREATININE 1.22* 1.17* 0.99 1.02* 0.80 0.78  CALCIUM 8.4* 7.7*  --  7.3* 7.5* 7.9*  AST 23  --   --  52*  --   --   ALT 17  --   --  32  --   --   ALKPHOS 72  --   --  80  --   --   BILITOT 0.6  --   --  0.5  --   --    ------------------------------------------------------------------------------------------------------------------ estimated creatinine clearance is 55.2 mL/min (by C-G formula based on Cr of 0.78). ------------------------------------------------------------------------------------------------------------------ No results for input(s): HGBA1C in the last 72 hours. ------------------------------------------------------------------------------------------------------------------ No results for input(s): CHOL, HDL, LDLCALC, TRIG, CHOLHDL, LDLDIRECT in the last 72 hours. ------------------------------------------------------------------------------------------------------------------ No results for input(s): TSH, T4TOTAL, T3FREE, THYROIDAB in the last 72  hours.  Invalid input(s): FREET3 ------------------------------------------------------------------------------------------------------------------ No results for input(s): VITAMINB12, FOLATE, FERRITIN, TIBC, IRON, RETICCTPCT in the last 72 hours.  Coagulation profile  Recent Labs Lab 11/18/14 0042  INR 1.32    No results for input(s): DDIMER in the last 72 hours.  Cardiac Enzymes No results for input(s): CKMB, TROPONINI, MYOGLOBIN in the last 168 hours.  Invalid input(s): CK ------------------------------------------------------------------------------------------------------------------ Invalid input(s): POCBNP     Time Spent in minutes   30 minutes   ELGERGAWY, DAWOOD M.D on 11/21/2014 at 12:19 PM  Between 7am to 7pm - Pager - [phone]  After 7pm go to [email or web address] - password [facility]  [facility]   Office  [phone]

## 2014-11-21 NOTE — Care Management Important Message (Signed)
Important Message  Patient Details  Name: Amanda [name] MRN: [id number] Date of Birth: 11-10-1940   Medicare Important Message Given:  Yes-second notification given    Delorse Lek 11/21/2014, 10:05 AM

## 2014-11-21 NOTE — Progress Notes (Signed)
RT stopped CPT after 6 minutes due to patient feeling nausea.

## 2014-11-22 ENCOUNTER — Inpatient Hospital Stay (HOSPITAL_COMMUNITY): Payer: Medicare Other

## 2014-11-22 LAB — LIPID PANEL
Cholesterol: 143 mg/dL (ref 0–200)
HDL: 19 mg/dL — ABNORMAL LOW (ref >40)
LDL CALC: 91 mg/dL (ref 0–99)
TRIGLYCERIDES: 164 mg/dL — AB (ref <150)
Total CHOL/HDL Ratio: 7.5 RATIO
VLDL: 33 mg/dL (ref 0–40)

## 2014-11-22 LAB — GLUCOSE, CAPILLARY: Glucose-Capillary: 108 mg/dL — ABNORMAL HIGH (ref 65–99)

## 2014-11-22 NOTE — Progress Notes (Signed)
Pt has refused CPT by Vest for tonight. Pt explains during first chest vest therapy she was only able to tolerate 5 minutes or so (this is what I was also told during report). She explained it made her very nauseous and increased her pain. She states she has pain due to increased cough in the past week. Her husband stated he didn't think this would be best for her. I did explain the therapy to both patient and her husband and told her is would be better for her to try but if she was unable to tolerate vest we had to work with flutter and IS frequently. I educated the patient and husband on best Flutter & IS technique and how often. She seemed to be more accepting of this. Pt's husband is questioning how to get a Pulmonary consult. I explained I would pass this on to AM RT and told patients husband to the please ask AM RN. I spent around 30-35 minutes answering questions about current therapy and illness. They were very appreciative of this. At this time patient has stated she will [place] IS/Flutter hourly until MD can be notified of Chest Vest concerns. I told them to please have RN call if they had more questions. RT will monitor and make AM RT aware.

## 2014-11-22 NOTE — Progress Notes (Signed)
Patient Demographics  Amanda [name], is a 74 y.o. female, DOB - 1941/01/24, ZGY:[id number]  Admit date - 11/17/2014   Admitting Physician Nita Sells, MD  Outpatient Primary MD for the patient is [name], DO  LOS - 5   Chief Complaint  Patient presents with  . Urinary Tract Infection        Subjective:   Amanda [name] today has, No headache, No chest pain, No abdominal pain - No Nausea, patient reports her shortness of breath is much improving, as will her cough, it remains nonproductive.  Assessment & Plan    Principal Problem:   CAP (community acquired pneumonia) Active Problems:   Hypothyroidism   HLD (hyperlipidemia)   GERD (gastroesophageal reflux disease)   Acute respiratory failure with hypoxia  Acute hypoxic respiratory failure - Secondary to pneumonia -  wean oxygen as tolerated, currently on 2 L nasal cannula .  Community-acquired pneumonia  due to legionella  - Initially on IV Rocephin and azithromycin, with no improvement, repeat x-ray showing worsening infiltrate, had increased oxygen requirement, so antibiotics was changed to vancomycin and Zosyn (which would not cover Legionella )on 9/17>>9/19, transitioned back to levofloxacin on 9/19 as her legionella antigen was positive. - Legionella  Antigen positive. - blood cultures : No growth to date  -  urine antigen for strep nonreactive  - Continue with Mucinex, flutter valve, pulmonary toilet , chest PT .  Hypothyroidism - Continue with Synthroid - TSH within normal limits  Hyperlipidemia - Follow on lipid panel   Hypertension - Blood pressure is on the higher side, patient with no history of hypertension, will start on when necessary hydralazine.  Anemia  - Anemia of chronic illness, iron is 13, ferritin is 1489 , continue to monitor .   hyponatremia - Related to pneumonia, resolved with IV normal  saline.  Code Status: Full  Family Communication: Husband at bedside  Disposition Plan: anticipated discharge in 24-48 hours, as well and weaned off oxygen.   Procedures  None    Consults   None    Medications  Scheduled Meds: . acidophilus  1 capsule Oral Daily  . antiseptic oral rinse  7 mL Mouth Rinse BID  . buPROPion  300 mg Oral Daily  . guaiFENesin  1,200 mg Oral BID  . heparin  5,000 Units Subcutaneous 3 times per day  . levofloxacin (LEVAQUIN) IV  750 mg Intravenous Q24H  . levothyroxine  125 mcg Oral QAC breakfast  . multivitamin-lutein  1 capsule Oral Daily  . vitamin C  1,000 mg Oral Daily   Continuous Infusions:   PRN Meds:.acetaminophen **OR** acetaminophen, albuterol, hydrALAZINE, magic mouthwash w/lidocaine, ondansetron (ZOFRAN) IV, zolpidem  DVT Prophylaxis Heparin   Lab Results  Component Value Date   PLT 305 11/20/2014    Antibiotics    Anti-infectives    Start     Dose/Rate Route Frequency Ordered Stop   11/21/14 1400  levofloxacin (LEVAQUIN) IVPB 750 mg     750 mg 100 mL/hr over 90 Minutes Intravenous Every 24 hours 11/21/14 1327     11/19/14 1400  piperacillin-tazobactam (ZOSYN) IVPB 3.375 g  Status:  Discontinued     3.375 g 12.5 mL/hr over 240 Minutes Intravenous 3 times per day 11/19/14  1306 11/21/14 1327   11/19/14 1330  vancomycin (VANCOCIN) IVPB 750 mg/150 ml premix  Status:  Discontinued     750 mg 150 mL/hr over 60 Minutes Intravenous Every 12 hours 11/19/14 1306 11/21/14 1327   11/18/14 1730  azithromycin (ZITHROMAX) tablet 500 mg  Status:  Discontinued     500 mg Oral Every 24 hours 11/17/14 2326 11/19/14 1230   11/18/14 1630  cefTRIAXone (ROCEPHIN) 1 g in dextrose 5 % 50 mL IVPB  Status:  Discontinued     1 g 100 mL/hr over 30 Minutes Intravenous Every 24 hours 11/17/14 2326 11/19/14 1230   11/17/14 2330  cefTRIAXone (ROCEPHIN) 1 g in dextrose 5 % 50 mL IVPB  Status:  Discontinued     1 g 100 mL/hr over 30 Minutes  Intravenous Every 24 hours 11/17/14 2318 11/17/14 2326   11/17/14 2330  azithromycin (ZITHROMAX) tablet 500 mg  Status:  Discontinued     500 mg Oral Every 24 hours 11/17/14 2318 11/17/14 2326   11/17/14 1725  azithromycin (ZITHROMAX) 500 MG injection    Comments:  Eulogio Ditch   : cabinet override      11/17/14 1725 11/18/14 0529   11/17/14 1630  cefTRIAXone (ROCEPHIN) 1 g in dextrose 5 % 50 mL IVPB     1 g 100 mL/hr over 30 Minutes Intravenous  Once 11/17/14 1626 11/17/14 1724   11/17/14 1630  azithromycin (ZITHROMAX) 500 mg in dextrose 5 % 250 mL IVPB     500 mg 250 mL/hr over 60 Minutes Intravenous  Once 11/17/14 1626 11/17/14 1940   11/17/14 1628  cefTRIAXone (ROCEPHIN) 1 G injection    Comments:  Eulogio Ditch   : cabinet override      11/17/14 1628 11/18/14 0444          Objective:   Filed Vitals:   11/21/14 1638 11/21/14 2300 11/22/14 0615 11/22/14 1000  BP:  147/73 171/75 156/80  Pulse: 86 73 71 80  Temp:  97.8 F (36.6 C) 97.6 F (36.4 C) 98.4 F (36.9 C)  TempSrc:  Oral Oral Oral  Resp: 18 18 18 18   Height:      Weight:      SpO2: 97% 97% 93% 96%    Wt Readings from Last 3 Encounters:  11/20/14 68.5 kg (151 lb 0.2 oz)  11/15/14 66.225 kg (146 lb)  06/02/14 69.854 kg (154 lb)     Intake/Output Summary (Last 24 hours) at 11/22/14 1607 Last data filed at 11/22/14 1300  Gross per 24 hour  Intake    480 ml  Output   2300 ml  Net  -1820 ml     Physical Exam  Awake Alert, frail [place].AT,PERRAL Supple Neck,No JVD, Symmetrical Chest  wall movement , right lung reveals  No Gallops,Rubs or new Murmurs, No Parasternal Heave +ve B.Sounds, Abd Soft, No tenderness, No organomegaly appriciated, No Cyanosis, Clubbing or edema, No new Rash or bruise    Data Review   Micro Results Recent Results (from the past 240 hour(s))  Blood culture (routine x 2)     Status: None   Collection Time: 11/15/14  4:07 PM  Result Value Ref Range Status   Specimen  Description BLOOD RIGHT ANTECUBITAL  Final   Special Requests BOTTLES DRAWN AEROBIC AND ANAEROBIC 5 ML EACH  Final   Culture   Final    NO GROWTH 5 DAYS Performed at [facility]    Report Status 11/20/2014 FINAL  Final  Urine  culture     Status: None   Collection Time: 11/15/14  4:25 PM  Result Value Ref Range Status   Specimen Description URINE, CLEAN CATCH  Final   Special Requests NONE  Final   Culture   Final    MULTIPLE SPECIES PRESENT, SUGGEST RECOLLECTION Performed at [facility]    Report Status 11/17/2014 FINAL  Final  Blood culture (routine x 2)     Status: None   Collection Time: 11/15/14  6:00 PM  Result Value Ref Range Status   Specimen Description BLOOD RIGHT HAND  Final   Special Requests BOTTLES DRAWN AEROBIC AND ANAEROBIC 5ML EACH  Final   Culture   Final    NO GROWTH 5 DAYS Performed at [facility]    Report Status 11/20/2014 FINAL  Final  Culture, blood (routine x 2) Call MD if unable to obtain prior to antibiotics being given     Status: None (Preliminary result)   Collection Time: 11/18/14 12:42 AM  Result Value Ref Range Status   Specimen Description BLOOD RIGHT ANTECUBITAL  Final   Special Requests BOTTLES DRAWN AEROBIC ONLY 5CC  Final   Culture NO GROWTH 4 DAYS  Final   Report Status PENDING  Incomplete  Culture, blood (routine x 2) Call MD if unable to obtain prior to antibiotics being given     Status: None (Preliminary result)   Collection Time: 11/18/14 12:57 AM  Result Value Ref Range Status   Specimen Description BLOOD RIGHT ANTECUBITAL  Final   Special Requests IN PEDIATRIC BOTTLE 3CC  Final   Culture NO GROWTH 4 DAYS  Final   Report Status PENDING  Incomplete    Radiology Reports Dg Chest 2 View  11/17/2014   CLINICAL DATA:  Cough, fever, body aches, weakness, fatigue. Nausea and loss of appetite since Sunday. History of CHF, pneumonia, shingles.  EXAM: CHEST  2 VIEW  COMPARISON:  11/15/2014  FINDINGS: The heart is  enlarged. There is interstitial edema. There has been development of significant right upper lobe and right lower lobe infiltrate, likely infectious. Small bilateral pleural effusions are present.  IMPRESSION: 1. Right upper lobe and right lower lobe infiltrates. 2. Cardiomegaly and mild edema.  Small effusions.   Electronically Signed   By: Nolon Nations M.D.   On: 11/17/2014 16:17   Dg Chest 2 View  11/15/2014   CLINICAL DATA:  Nausea and vomiting for 2 days.  Initial encounter.  EXAM: CHEST  2 VIEW  COMPARISON:  07/10/2012.  FINDINGS: Chronic cardiomegaly. Interstitial pulmonary edema and pulmonary vascular congestion compatible with CHF. No pleural effusions. Thickening of the fissures is present on the lateral projection.  IMPRESSION: Mild CHF.   Electronically Signed   By: Dereck Ligas M.D.   On: 11/15/2014 16:42   Dg Chest Port 1 View  11/22/2014   CLINICAL DATA:  Dyspnea  EXAM: PORTABLE CHEST - 1 VIEW  COMPARISON:  11/20/2014  FINDINGS: Diffuse bilateral airspace disease has improved. Left upper lung zone is relatively clear. Upper normal heart size. No pneumothorax.  IMPRESSION: Improving bilateral airspace disease.   Electronically Signed   By: Marybelle Killings M.D.   On: 11/22/2014 08:16   Dg Chest Port 1 View  11/20/2014   CLINICAL DATA:  Hypoxia  EXAM: PORTABLE CHEST - 1 VIEW  COMPARISON:  11/17/2014  FINDINGS: Cardiomediastinal silhouette is stable. Persistent hazy opacification in right upper and right lower lobe highly suspicious for asymmetric pneumonia or edema. Minimal left basilar atelectasis.  Degenerative changes thoracic spine.  IMPRESSION: Persistent hazy opacification right upper and right lower lobe highly suspicious for asymmetric edema or pneumonia. Mild left basilar atelectasis. Degenerative changes thoracic spine.   Electronically Signed   By: Lahoma Crocker M.D.   On: 11/20/2014 10:46   Dg Chest Port 1 View  11/18/2014   CLINICAL DATA:  74 year old female with sepsis. History  of pneumonia and CHF.  EXAM: PORTABLE CHEST - 1 VIEW  COMPARISON:  Earlier Radiograph dated 11/17/2014 and multiple other radiograph dating back to 07/17/2007  FINDINGS: There is a patchy area of consolidation with air bronchogram in the right mid lung field most compatible with pneumonia. Underlying mass is not excluded. There has been interval progression of opacity compared to the earlier study. Clinical correlation and follow-up resolution recommended. No pleural effusion or pneumothorax. Stable cardiac silhouette. The osseous structures are grossly unremarkable.  IMPRESSION: Slight increase in the right mid lung field airspace opacity. Follow-up recommended.   Electronically Signed   By: Anner Crete M.D.   On: 11/18/2014 00:48     CBC  Recent Labs Lab 11/15/14 1607 11/17/14 1525 11/18/14 0042 11/18/14 0057 11/18/14 0240 11/20/14 0612  WBC 9.8 8.5 7.4  --  7.6 8.0  HGB 11.7* 10.9* 10.2*  --  10.1* 9.6*  HCT 35.1* 32.0* 30.0* 29.8* 29.4* 28.5*  PLT 212 220 210  --  214 305  MCV 81.8 79.4 78.7  --  78.6 78.9  MCH 27.3 27.0 26.8  --  27.0 26.6  MCHC 33.3 34.1 34.0  --  34.4 33.7  RDW 13.4 13.6 13.6  --  13.6 14.5  LYMPHSABS 1.2 0.9  --   --   --   --   MONOABS 1.2* 0.8  --   --   --   --   EOSABS 0.0 0.0  --   --   --   --   BASOSABS 0.0 0.0  --   --   --   --     Chemistries   Recent Labs Lab 11/15/14 1607 11/17/14 1525 11/18/14 0042 11/18/14 0240 11/19/14 0640 11/20/14 0612  NA 131* 127*  --  127* 133* 136  K 3.6 3.7  --  3.6 3.5 3.6  CL 95* 94*  --  95* 102 102  CO2 25 23  --  23 24 27   GLUCOSE 128* 123*  --  121* 97 108*  BUN 17 17  --  11 9 8   CREATININE 1.22* 1.17* 0.99 1.02* 0.80 0.78  CALCIUM 8.4* 7.7*  --  7.3* 7.5* 7.9*  AST 23  --   --  52*  --   --   ALT 17  --   --  32  --   --   ALKPHOS 72  --   --  80  --   --   BILITOT 0.6  --   --  0.5  --   --     ------------------------------------------------------------------------------------------------------------------ estimated creatinine clearance is 55.2 mL/min (by C-G formula based on Cr of 0.78). ------------------------------------------------------------------------------------------------------------------ No results for input(s): HGBA1C in the last 72 hours. ------------------------------------------------------------------------------------------------------------------  Recent Labs  11/22/14 0550  CHOL 143  HDL 19*  LDLCALC 91  TRIG 164*  CHOLHDL 7.5   ------------------------------------------------------------------------------------------------------------------ No results for input(s): TSH, T4TOTAL, T3FREE, THYROIDAB in the last 72 hours.  Invalid input(s): FREET3 ------------------------------------------------------------------------------------------------------------------ No results for input(s): VITAMINB12, FOLATE, FERRITIN, TIBC, IRON, RETICCTPCT in the last 72 hours.  Coagulation profile  Recent Labs  Lab 11/18/14 0042  INR 1.32    No results for input(s): DDIMER in the last 72 hours.  Cardiac Enzymes No results for input(s): CKMB, TROPONINI, MYOGLOBIN in the last 168 hours.  Invalid input(s): CK ------------------------------------------------------------------------------------------------------------------ Invalid input(s): POCBNP     Time Spent in minutes   30 minutes   ELGERGAWY, DAWOOD M.D on 11/22/2014 at 4:07 PM  Between 7am to 7pm - Pager - [phone]  After 7pm go to [email or web address] - password [facility]  [facility]   Office  [phone]

## 2014-11-22 NOTE — Progress Notes (Signed)
Patient refuses to wear the chest vest. She is using flutter and IS as an alternative. RT will continue to monitor.

## 2014-11-22 NOTE — Plan of Care (Signed)
Problem: Phase II Progression Outcomes Goal: Wean O2 if indicated Outcome: Progressing Pt is only using 1L now will try to get her off completely during day shift 11/22/14

## 2014-11-22 NOTE — Progress Notes (Signed)
Patient refused CPT. She stated it makes her to nauseous. Patient and husband would like a consult with pulmonology due to her respiratory history. RT contacted RN to inform her of the patients request. RT made sure patient was using flutter and IS and explained the importance of them both.

## 2014-11-22 NOTE — Evaluation (Signed)
Physical Therapy Evaluation Patient Details Name: Amanda [name] MRN: [id number] DOB: October 11, 1940 Today's Date: 11/22/2014   History of Present Illness  74 y.o. female with history of GERD HLD hypothyroid CHF presents to the hospital with increased SOB and Fever. Pt with UTI and CAP  Clinical Impression  Amanda [name] is pleasant with slightly flat affect. Pt with gait limited by balance and fatigue with sats dropping to 86% on RA and unable to attempt additional trials on oxygen. Pt with generalized weakness and fatigue with activity and nauseated end of session limiting further mobility. Pt will benefit from acute therapy to maximize mobility, function, gait and balance to decrease burden of care. Recommend daily mobility with nursing staff. Pt encouraged to perform bil LE HEP throughout the day but unable to practice at this time. Will follow.     Follow Up Recommendations Home health PT    Equipment Recommendations  Rolling walker with 5" wheels    Recommendations for Other Services       Precautions / Restrictions Precautions Precautions: Fall Precaution Comments: watch sats      Mobility  Bed Mobility Overal bed mobility: Modified Independent                Transfers Overall transfer level: Modified independent                  Ambulation/Gait Ambulation/Gait assistance: Min assist Ambulation Distance (Feet): 80 Feet Assistive device: None Gait Pattern/deviations: Step-through pattern;Narrow base of support   Gait velocity interpretation: Below normal speed for age/gender General Gait Details: pt with initial LOB within first 10' of gait requiring min assist for gait, pt denied RW but would benefit from AD for gait given current balance and strength deficits. Pt with sats dropping to 86% on RA with gait limited to 80'. Pt with dry heaving end of session and denied attempting additional gait to obtain ambulation sats on O2  Stairs            Wheelchair  Mobility    Modified Rankin (Stroke Patients Only)       Balance Overall balance assessment: Needs assistance   Sitting balance-Leahy Scale: Good       Standing balance-Leahy Scale: Fair                               Pertinent Vitals/Pain Pain Assessment: No/denies pain  90-93% RA at rest with drop to 86% RA with gait    Home Living Family/patient expects to be discharged to:: Private residence Living Arrangements: Spouse/significant other Available Help at Discharge: Available 24 hours/day;Family Type of Home: House Home Access: Stairs to enter Entrance Stairs-Rails: Psychiatric nurse of Steps: 3 Home Layout: One level Home Equipment: Cane - single point      Prior Function Level of Independence: Independent         Comments: 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        Extremity/Trunk Assessment   Upper Extremity Assessment: Generalized weakness           Lower Extremity Assessment: Generalized weakness      Cervical / Trunk Assessment: Normal  Communication   Communication: No difficulties  Cognition Arousal/Alertness: Awake/alert Behavior During Therapy: WFL for tasks assessed/performed Overall Cognitive Status: Within Functional Limits for tasks assessed  General Comments      Exercises        Assessment/Plan    PT Assessment Patient needs continued PT services  PT Diagnosis Difficulty walking;Abnormality of gait;Generalized weakness   PT Problem List Decreased strength;Decreased activity tolerance;Decreased balance;Decreased knowledge of use of DME;Cardiopulmonary status limiting activity;Decreased mobility  PT Treatment Interventions Gait training;DME instruction;Functional mobility training;Stair training;Therapeutic activities;Therapeutic exercise;Balance training;Patient/family education   PT Goals (Current goals  can be found in the Care Plan section) Acute Rehab PT Goals Patient Stated Goal: return home and to church PT Goal Formulation: With patient/family Time For Goal Achievement: 12/06/14 Potential to Achieve Goals: Good    Frequency Min 3X/week   Barriers to discharge        Co-evaluation               End of Session   Activity Tolerance: Patient limited by fatigue Patient left: in chair;with call bell/phone within [name];with nursing/sitter in room;with family/visitor present Nurse Communication: Mobility status;Precautions         Time: 7829-5621 PT Time Calculation (min) (ACUTE ONLY): 21 min   Charges:   PT Evaluation $Initial PT Evaluation Tier I: 1 Procedure     PT G CodesMelford [name] 11/22/2014, 12:27 PM Amanda [name], [place]

## 2014-11-22 NOTE — Progress Notes (Signed)
Pt continuing to refuse chest vest, pt has Flutter valve and Incentive to use instead of chest vest.

## 2014-11-22 NOTE — Care Management Note (Signed)
Case Management Note  Patient Details  Name: Amanda [name] MRN: [id number] Date of Birth: March 11, 1940  Subjective/Objective:               CM following for progression and d/c planning.     Action/Plan: 11/22/14 Per PT pt will need HHPT and walker.   Expected Discharge Date:                  Expected Discharge Plan:  [place]  In-House Referral:  NA  Discharge planning Services  CM Consult  Post Acute Care Choice:  Durable Medical Equipment Choice offered to:  Patient  DME Arranged:  Walker rolling DME Agency:  [place]:  PT [place].  Status of Service:  Complete  Medicare Important Message Given:  Yes-second notification given Date Medicare IM Given:    Medicare IM give by:    Date Additional Medicare IM Given:    Additional Medicare Important Message give by:     If discussed at [place] of Stay Meetings, dates discussed:    Additional Comments:  Adron Bene, RN 11/22/2014, 12:17 PM

## 2014-11-23 ENCOUNTER — Inpatient Hospital Stay (HOSPITAL_COMMUNITY): Payer: Medicare Other

## 2014-11-23 DIAGNOSIS — A481 Legionnaires' disease: Principal | ICD-10-CM

## 2014-11-23 DIAGNOSIS — J189 Pneumonia, unspecified organism: Secondary | ICD-10-CM

## 2014-11-23 DIAGNOSIS — J9601 Acute respiratory failure with hypoxia: Secondary | ICD-10-CM

## 2014-11-23 LAB — CBC
HEMATOCRIT: 31.4 % — AB (ref 36.0–46.0)
HEMOGLOBIN: 10.7 g/dL — AB (ref 12.0–15.0)
MCH: 27.4 pg (ref 26.0–34.0)
MCHC: 34.1 g/dL (ref 30.0–36.0)
MCV: 80.5 fL (ref 78.0–100.0)
Platelets: 494 10*3/uL — ABNORMAL HIGH (ref 150–400)
RBC: 3.9 MIL/uL (ref 3.87–5.11)
RDW: 15.3 % (ref 11.5–15.5)
WBC: 9.7 10*3/uL (ref 4.0–10.5)

## 2014-11-23 LAB — CULTURE, BLOOD (ROUTINE X 2)
CULTURE: NO GROWTH
Culture: NO GROWTH

## 2014-11-23 LAB — BASIC METABOLIC PANEL
ANION GAP: 11 (ref 5–15)
BUN: 7 mg/dL (ref 6–20)
CHLORIDE: 98 mmol/L — AB (ref 101–111)
CO2: 30 mmol/L (ref 22–32)
Calcium: 8.4 mg/dL — ABNORMAL LOW (ref 8.9–10.3)
Creatinine, Ser: 0.84 mg/dL (ref 0.44–1.00)
GFR calc Af Amer: >60 mL/min (ref >60)
GLUCOSE: 109 mg/dL — AB (ref 65–99)
POTASSIUM: 3.1 mmol/L — AB (ref 3.5–5.1)
Sodium: 139 mmol/L (ref 135–145)

## 2014-11-23 LAB — GLUCOSE, CAPILLARY: GLUCOSE-CAPILLARY: 119 mg/dL — AB (ref 65–99)

## 2014-11-23 LAB — LEGIONELLA ANTIGEN, URINE

## 2014-11-23 MED ORDER — POTASSIUM CHLORIDE CRYS ER 20 MEQ PO TBCR
40.0000 meq | EXTENDED_RELEASE_TABLET | Freq: Once | ORAL | Status: AC
  Administered 2014-11-23: 40 meq via ORAL
  Filled 2014-11-23: qty 2

## 2014-11-23 MED ORDER — IPRATROPIUM-ALBUTEROL 0.5-2.5 (3) MG/3ML IN SOLN
3.0000 mL | Freq: Three times a day (TID) | RESPIRATORY_TRACT | Status: DC
  Administered 2014-11-23 – 2014-11-24 (×2): 3 mL via RESPIRATORY_TRACT
  Filled 2014-11-23 (×2): qty 3

## 2014-11-23 MED ORDER — IPRATROPIUM-ALBUTEROL 0.5-2.5 (3) MG/3ML IN SOLN
3.0000 mL | Freq: Three times a day (TID) | RESPIRATORY_TRACT | Status: DC
  Administered 2014-11-23: 3 mL via RESPIRATORY_TRACT
  Filled 2014-11-23: qty 3

## 2014-11-23 MED ORDER — LEVOFLOXACIN 750 MG PO TABS
750.0000 mg | ORAL_TABLET | ORAL | Status: DC
Start: 2014-11-23 — End: 2014-11-25
  Administered 2014-11-23 – 2014-11-24 (×2): 750 mg via ORAL
  Filled 2014-11-23 (×2): qty 1

## 2014-11-23 NOTE — Progress Notes (Signed)
Physical Therapy Treatment Patient Details Name: Amanda [name] MRN: [id number] DOB: Jul 12, 1940 Today's Date: 11/23/2014    History of Present Illness 74 y.o. female with history of GERD HLD hypothyroid CHF presents to the hospital with increased SOB and Fever. Pt with UTI and CAP    PT Comments    Reports she feels much better and was able to tolerate more activity; O2 sats remained above 90%  Follow Up Recommendations  Home health PT     Equipment Recommendations  Rolling walker with 5" wheels    Recommendations for Other Services       Precautions / Restrictions Precautions Precautions: Fall Precaution Comments: watch sats Restrictions Weight Bearing Restrictions: No    Mobility  Bed Mobility                  Transfers                    Ambulation/Gait Ambulation/Gait assistance: Min guard Ambulation Distance (Feet): 160 Feet Assistive device: Rolling walker (2 wheeled) Gait Pattern/deviations: Step-through pattern     General Gait Details: Much steadier with RW, and she was more open to listening to rationale for using the rW to decrease the work of walking; Cues to self-monitor for activity tolerance   Stairs            Wheelchair Mobility    Modified Rankin (Stroke Patients Only)       Balance             Standing balance-Leahy Scale: Fair                      Cognition Arousal/Alertness: Awake/alert Behavior During Therapy: WFL for tasks assessed/performed Overall Cognitive Status: Within Functional Limits for tasks assessed                      Exercises      General Comments General comments (skin integrity, edema, etc.): Walked on Room Air and O2 sats decr to 90% observed lowest; [place] nauseated with + vomiting after amb      Pertinent Vitals/Pain Pain Assessment: No/denies pain (just nausea and vomiting after amb)    Home Living                      Prior Function             PT Goals (current goals can now be found in the care plan section) Acute Rehab PT Goals Patient Stated Goal: return home and to church PT Goal Formulation: With patient/family Time For Goal Achievement: 12/06/14 Potential to Achieve Goals: Good Progress towards PT goals: Progressing toward goals    Frequency  Min 3X/week    PT Plan Current plan remains appropriate    Co-evaluation             End of Session Equipment Utilized During Treatment:  (Dinamap to monitor O2 sats) Activity Tolerance: Patient tolerated treatment well;Other (comment) (though amb limited by nausea) Patient left: in bed;with call bell/phone within reach;with nursing/sitter in room (Rn giving nausea meds)     Time: 5093-2671 PT Time Calculation (min) (ACUTE ONLY): 23 min  Charges:  $Gait Training: 23-37 mins                    G Codes:      Roney Marion Hamff 11/23/2014, 2:01 PM  Roney Marion, [place] Pager [phone]  Office [phone]

## 2014-11-23 NOTE — Progress Notes (Signed)
Patient Demographics  Amanda [name], is a 74 y.o. female, DOB - Jul 07, 1940, ION:[id number]  Admit date - 11/17/2014   Admitting Physician Nita Sells, MD  Outpatient Primary MD for the patient is [name], DO  LOS - 6   Chief Complaint  Patient presents with  . Urinary Tract Infection        Subjective:   Amanda [name] today has, still cough, weak, o2 dropped into the 80's  when walking,  Husband in room.  Assessment & Plan    Principal Problem:   CAP (community acquired pneumonia) Active Problems:   Hypothyroidism   HLD (hyperlipidemia)   GERD (gastroesophageal reflux disease)   Acute respiratory failure with hypoxia  Acute hypoxic respiratory failure - Secondary to pneumonia -  wean oxygen as tolerated, currently on 2 L nasal cannula . -persistent infiltrate on cxr, ct chest pending, ambulate and check oxygen saturation.  Community-acquired pneumonia  due to legionella  - Initially on IV Rocephin and azithromycin, with no improvement, repeat x-ray showing worsening infiltrate, had increased oxygen requirement, so antibiotics was changed to vancomycin and Zosyn (which would not cover Legionella )on 9/17>>9/19, transitioned back to levofloxacin on 9/19 as her legionella antigen was positive. - Legionella  Antigen positive. - blood cultures : No growth to date  -  urine antigen for strep nonreactive  - Continue with Mucinex, flutter valve, pulmonary toilet , chest PT .  Hypothyroidism - Continue with Synthroid - TSH within normal limits  Hyperlipidemia - Follow on lipid panel   Hypertension - Blood pressure is on the higher side, patient with no history of hypertension, will start on when necessary hydralazine.  Anemia  - Anemia of chronic illness, iron is 13, ferritin is 1489 , continue to monitor .   hyponatremia - Related to pneumonia, resolved with IV normal  saline.  Code Status: Full  Family Communication: Husband at bedside  Disposition Plan: anticipated discharge in 24-48 hours, as well and weaned off oxygen.   Procedures  None    Consults   None    Medications  Scheduled Meds: . acidophilus  1 capsule Oral Daily  . antiseptic oral rinse  7 mL Mouth Rinse BID  . buPROPion  300 mg Oral Daily  . guaiFENesin  1,200 mg Oral BID  . heparin  5,000 Units Subcutaneous 3 times per day  . ipratropium-albuterol  3 mL Nebulization 3 times per day  . levofloxacin  750 mg Oral Q24H  . levothyroxine  125 mcg Oral QAC breakfast  . multivitamin-lutein  1 capsule Oral Daily  . potassium chloride  40 mEq Oral Once  . vitamin C  1,000 mg Oral Daily   Continuous Infusions:   PRN Meds:.acetaminophen **OR** acetaminophen, albuterol, hydrALAZINE, magic mouthwash w/lidocaine, ondansetron (ZOFRAN) IV, zolpidem  DVT Prophylaxis Heparin   Lab Results  Component Value Date   PLT 494* 11/23/2014    Antibiotics    Anti-infectives    Start     Dose/Rate Route Frequency Ordered Stop   11/23/14 1400  levofloxacin (LEVAQUIN) tablet 750 mg     750 mg Oral Every 24 hours 11/23/14 0943     11/21/14 1400  levofloxacin (LEVAQUIN) IVPB 750 mg  Status:  Discontinued  750 mg 100 mL/hr over 90 Minutes Intravenous Every 24 hours 11/21/14 1327 11/23/14 0943   11/19/14 1400  piperacillin-tazobactam (ZOSYN) IVPB 3.375 g  Status:  Discontinued     3.375 g 12.5 mL/hr over 240 Minutes Intravenous 3 times per day 11/19/14 1306 11/21/14 1327   11/19/14 1330  vancomycin (VANCOCIN) IVPB 750 mg/150 ml premix  Status:  Discontinued     750 mg 150 mL/hr over 60 Minutes Intravenous Every 12 hours 11/19/14 1306 11/21/14 1327   11/18/14 1730  azithromycin (ZITHROMAX) tablet 500 mg  Status:  Discontinued     500 mg Oral Every 24 hours 11/17/14 2326 11/19/14 1230   11/18/14 1630  cefTRIAXone (ROCEPHIN) 1 g in dextrose 5 % 50 mL IVPB  Status:  Discontinued     1  g 100 mL/hr over 30 Minutes Intravenous Every 24 hours 11/17/14 2326 11/19/14 1230   11/17/14 2330  cefTRIAXone (ROCEPHIN) 1 g in dextrose 5 % 50 mL IVPB  Status:  Discontinued     1 g 100 mL/hr over 30 Minutes Intravenous Every 24 hours 11/17/14 2318 11/17/14 2326   11/17/14 2330  azithromycin (ZITHROMAX) tablet 500 mg  Status:  Discontinued     500 mg Oral Every 24 hours 11/17/14 2318 11/17/14 2326   11/17/14 1725  azithromycin (ZITHROMAX) 500 MG injection    Comments:  Eulogio Ditch   : cabinet override      11/17/14 1725 11/18/14 0529   11/17/14 1630  cefTRIAXone (ROCEPHIN) 1 g in dextrose 5 % 50 mL IVPB     1 g 100 mL/hr over 30 Minutes Intravenous  Once 11/17/14 1626 11/17/14 1724   11/17/14 1630  azithromycin (ZITHROMAX) 500 mg in dextrose 5 % 250 mL IVPB     500 mg 250 mL/hr over 60 Minutes Intravenous  Once 11/17/14 1626 11/17/14 1940   11/17/14 1628  cefTRIAXone (ROCEPHIN) 1 G injection    Comments:  Eulogio Ditch   : cabinet override      11/17/14 1628 11/18/14 0444          Objective:   Filed Vitals:   11/22/14 1000 11/22/14 1707 11/22/14 2128 11/23/14 0542  BP: 156/80 150/76 164/82 152/69  Pulse: 80 77 78 79  Temp: 98.4 F (36.9 C) 98 F (36.7 C) 98.7 F (37.1 C) 99 F (37.2 C)  TempSrc: Oral Oral Oral Oral  Resp: 18 18 20 16   Height:      Weight:   145 lb 14.4 oz (66.18 kg)   SpO2: 96% 98% 92% 93%    Wt Readings from Last 3 Encounters:  11/22/14 145 lb 14.4 oz (66.18 kg)  11/15/14 146 lb (66.225 kg)  06/02/14 154 lb (69.854 kg)     Intake/Output Summary (Last 24 hours) at 11/23/14 1032 Last data filed at 11/23/14 0543  Gross per 24 hour  Intake   1040 ml  Output    350 ml  Net    690 ml     Physical Exam  Awake Alert, frail Blue.AT,PERRAL Supple Neck,No JVD, Symmetrical Chest  wall movement , right lung reveals coarse breath sounds No Gallops,Rubs or new Murmurs, No Parasternal Heave +ve B.Sounds, Abd Soft, No tenderness, No organomegaly  appriciated, No Cyanosis, Clubbing or edema, No new Rash or bruise    Data Review   Micro Results Recent Results (from the past 240 hour(s))  Blood culture (routine x 2)     Status: None   Collection Time: 11/15/14  4:07 PM  Result Value Ref Range Status   Specimen Description BLOOD RIGHT ANTECUBITAL  Final   Special Requests BOTTLES DRAWN AEROBIC AND ANAEROBIC 5 ML EACH  Final   Culture   Final    NO GROWTH 5 DAYS Performed at [facility]    Report Status 11/20/2014 FINAL  Final  Urine culture     Status: None   Collection Time: 11/15/14  4:25 PM  Result Value Ref Range Status   Specimen Description URINE, CLEAN CATCH  Final   Special Requests NONE  Final   Culture   Final    MULTIPLE SPECIES PRESENT, SUGGEST RECOLLECTION Performed at [facility]    Report Status 11/17/2014 FINAL  Final  Blood culture (routine x 2)     Status: None   Collection Time: 11/15/14  6:00 PM  Result Value Ref Range Status   Specimen Description BLOOD RIGHT HAND  Final   Special Requests BOTTLES DRAWN AEROBIC AND ANAEROBIC 5ML EACH  Final   Culture   Final    NO GROWTH 5 DAYS Performed at [facility]    Report Status 11/20/2014 FINAL  Final  Culture, blood (routine x 2) Call MD if unable to obtain prior to antibiotics being given     Status: None (Preliminary result)   Collection Time: 11/18/14 12:42 AM  Result Value Ref Range Status   Specimen Description BLOOD RIGHT ANTECUBITAL  Final   Special Requests BOTTLES DRAWN AEROBIC ONLY 5CC  Final   Culture NO GROWTH 4 DAYS  Final   Report Status PENDING  Incomplete  Culture, blood (routine x 2) Call MD if unable to obtain prior to antibiotics being given     Status: None (Preliminary result)   Collection Time: 11/18/14 12:57 AM  Result Value Ref Range Status   Specimen Description BLOOD RIGHT ANTECUBITAL  Final   Special Requests IN PEDIATRIC BOTTLE 3CC  Final   Culture NO GROWTH 4 DAYS  Final   Report Status PENDING   Incomplete    Radiology Reports Dg Chest 2 View  11/17/2014   CLINICAL DATA:  Cough, fever, body aches, weakness, fatigue. Nausea and loss of appetite since Sunday. History of CHF, pneumonia, shingles.  EXAM: CHEST  2 VIEW  COMPARISON:  11/15/2014  FINDINGS: The heart is enlarged. There is interstitial edema. There has been development of significant right upper lobe and right lower lobe infiltrate, likely infectious. Small bilateral pleural effusions are present.  IMPRESSION: 1. Right upper lobe and right lower lobe infiltrates. 2. Cardiomegaly and mild edema.  Small effusions.   Electronically Signed   By: Nolon Nations M.D.   On: 11/17/2014 16:17   Dg Chest 2 View  11/15/2014   CLINICAL DATA:  Nausea and vomiting for 2 days.  Initial encounter.  EXAM: CHEST  2 VIEW  COMPARISON:  07/10/2012.  FINDINGS: Chronic cardiomegaly. Interstitial pulmonary edema and pulmonary vascular congestion compatible with CHF. No pleural effusions. Thickening of the fissures is present on the lateral projection.  IMPRESSION: Mild CHF.   Electronically Signed   By: Dereck Ligas M.D.   On: 11/15/2014 16:42   Dg Chest Port 1 View  11/22/2014   CLINICAL DATA:  Dyspnea  EXAM: PORTABLE CHEST - 1 VIEW  COMPARISON:  11/20/2014  FINDINGS: Diffuse bilateral airspace disease has improved. Left upper lung zone is relatively clear. Upper normal heart size. No pneumothorax.  IMPRESSION: Improving bilateral airspace disease.   Electronically Signed   By: Rodena Goldmann.D.  On: 11/22/2014 08:16   Dg Chest Port 1 View  11/20/2014   CLINICAL DATA:  Hypoxia  EXAM: PORTABLE CHEST - 1 VIEW  COMPARISON:  11/17/2014  FINDINGS: Cardiomediastinal silhouette is stable. Persistent hazy opacification in right upper and right lower lobe highly suspicious for asymmetric pneumonia or edema. Minimal left basilar atelectasis. Degenerative changes thoracic spine.  IMPRESSION: Persistent hazy opacification right upper and right lower lobe highly  suspicious for asymmetric edema or pneumonia. Mild left basilar atelectasis. Degenerative changes thoracic spine.   Electronically Signed   By: Lahoma Crocker M.D.   On: 11/20/2014 10:46   Dg Chest Port 1 View  11/18/2014   CLINICAL DATA:  74 year old female with sepsis. History of pneumonia and CHF.  EXAM: PORTABLE CHEST - 1 VIEW  COMPARISON:  Earlier Radiograph dated 11/17/2014 and multiple other radiograph dating back to 07/17/2007  FINDINGS: There is a patchy area of consolidation with air bronchogram in the right mid lung field most compatible with pneumonia. Underlying mass is not excluded. There has been interval progression of opacity compared to the earlier study. Clinical correlation and follow-up resolution recommended. No pleural effusion or pneumothorax. Stable cardiac silhouette. The osseous structures are grossly unremarkable.  IMPRESSION: Slight increase in the right mid lung field airspace opacity. Follow-up recommended.   Electronically Signed   By: Anner Crete M.D.   On: 11/18/2014 00:48     CBC  Recent Labs Lab 11/17/14 1525 11/18/14 0042 11/18/14 0057 11/18/14 0240 11/20/14 0612 11/23/14 0435  WBC 8.5 7.4  --  7.6 8.0 9.7  HGB 10.9* 10.2*  --  10.1* 9.6* 10.7*  HCT 32.0* 30.0* 29.8* 29.4* 28.5* 31.4*  PLT 220 210  --  214 305 494*  MCV 79.4 78.7  --  78.6 78.9 80.5  MCH 27.0 26.8  --  27.0 26.6 27.4  MCHC 34.1 34.0  --  34.4 33.7 34.1  RDW 13.6 13.6  --  13.6 14.5 15.3  LYMPHSABS 0.9  --   --   --   --   --   MONOABS 0.8  --   --   --   --   --   EOSABS 0.0  --   --   --   --   --   BASOSABS 0.0  --   --   --   --   --     Chemistries   Recent Labs Lab 11/17/14 1525 11/18/14 0042 11/18/14 0240 11/19/14 0640 11/20/14 0612 11/23/14 0435  NA 127*  --  127* 133* 136 139  K 3.7  --  3.6 3.5 3.6 3.1*  CL 94*  --  95* 102 102 98*  CO2 23  --  23 24 27 30   GLUCOSE 123*  --  121* 97 108* 109*  BUN 17  --  11 9 8 7   CREATININE 1.17* 0.99 1.02* 0.80 0.78 0.84   CALCIUM 7.7*  --  7.3* 7.5* 7.9* 8.4*  AST  --   --  52*  --   --   --   ALT  --   --  32  --   --   --   ALKPHOS  --   --  80  --   --   --   BILITOT  --   --  0.5  --   --   --    ------------------------------------------------------------------------------------------------------------------ estimated creatinine clearance is 51.6 mL/min (by C-G formula based on Cr of 0.84). ------------------------------------------------------------------------------------------------------------------ No results  for input(s): HGBA1C in the last 72 hours. ------------------------------------------------------------------------------------------------------------------  Recent Labs  11/22/14 0550  CHOL 143  HDL 19*  LDLCALC 91  TRIG 164*  CHOLHDL 7.5   ------------------------------------------------------------------------------------------------------------------ No results for input(s): TSH, T4TOTAL, T3FREE, THYROIDAB in the last 72 hours.  Invalid input(s): FREET3 ------------------------------------------------------------------------------------------------------------------ No results for input(s): VITAMINB12, FOLATE, FERRITIN, TIBC, IRON, RETICCTPCT in the last 72 hours.  Coagulation profile  Recent Labs Lab 11/18/14 0042  INR 1.32    No results for input(s): DDIMER in the last 72 hours.  Cardiac Enzymes No results for input(s): CKMB, TROPONINI, MYOGLOBIN in the last 168 hours.  Invalid input(s): CK ------------------------------------------------------------------------------------------------------------------ Invalid input(s): POCBNP     Time Spent in minutes   43 minutes   Krisandra Bueno MD PhD on 11/23/2014 at 10:32 AM  Between 7am to 7pm - Pager - [phone]  After 7pm go to [email or web address] - password [email or web address]  [facility]   Office  [phone]

## 2014-11-23 NOTE — Progress Notes (Signed)
Pt refuses CPT via Vest. She is using IS/Flutter hourly while awake.

## 2014-11-24 ENCOUNTER — Ambulatory Visit (HOSPITAL_COMMUNITY): Payer: Medicare Other

## 2014-11-24 DIAGNOSIS — R0600 Dyspnea, unspecified: Secondary | ICD-10-CM

## 2014-11-24 LAB — CBC WITH DIFFERENTIAL/PLATELET
BASOS PCT: 0 %
Basophils Absolute: 0 10*3/uL (ref 0.0–0.1)
Eosinophils Absolute: 0.2 10*3/uL (ref 0.0–0.7)
Eosinophils Relative: 2 %
HEMATOCRIT: 31.6 % — AB (ref 36.0–46.0)
HEMOGLOBIN: 10.3 g/dL — AB (ref 12.0–15.0)
LYMPHS ABS: 1.7 10*3/uL (ref 0.7–4.0)
LYMPHS PCT: 19 %
MCH: 26.3 pg (ref 26.0–34.0)
MCHC: 32.6 g/dL (ref 30.0–36.0)
MCV: 80.8 fL (ref 78.0–100.0)
MONOS PCT: 9 %
Monocytes Absolute: 0.8 10*3/uL (ref 0.1–1.0)
NEUTROS ABS: 6.3 10*3/uL (ref 1.7–7.7)
NEUTROS PCT: 70 %
Platelets: 525 10*3/uL — ABNORMAL HIGH (ref 150–400)
RBC: 3.91 MIL/uL (ref 3.87–5.11)
RDW: 15.8 % — ABNORMAL HIGH (ref 11.5–15.5)
WBC: 9 10*3/uL (ref 4.0–10.5)

## 2014-11-24 LAB — COMPREHENSIVE METABOLIC PANEL
ALBUMIN: 2.5 g/dL — AB (ref 3.5–5.0)
ALK PHOS: 101 U/L (ref 38–126)
ALT: 49 U/L (ref 14–54)
ANION GAP: 9 (ref 5–15)
AST: 35 U/L (ref 15–41)
BILIRUBIN TOTAL: 0.4 mg/dL (ref 0.3–1.2)
BUN: 8 mg/dL (ref 6–20)
CALCIUM: 8.5 mg/dL — AB (ref 8.9–10.3)
CO2: 31 mmol/L (ref 22–32)
Chloride: 98 mmol/L — ABNORMAL LOW (ref 101–111)
Creatinine, Ser: 0.88 mg/dL (ref 0.44–1.00)
GFR calc Af Amer: >60 mL/min (ref >60)
GFR calc non Af Amer: >60 mL/min (ref >60)
GLUCOSE: 114 mg/dL — AB (ref 65–99)
Potassium: 3.5 mmol/L (ref 3.5–5.1)
Sodium: 138 mmol/L (ref 135–145)
TOTAL PROTEIN: 5.3 g/dL — AB (ref 6.5–8.1)

## 2014-11-24 LAB — GLUCOSE, CAPILLARY: Glucose-Capillary: 122 mg/dL — ABNORMAL HIGH (ref 65–99)

## 2014-11-24 LAB — MAGNESIUM: Magnesium: 1.8 mg/dL (ref 1.7–2.4)

## 2014-11-24 MED ORDER — GUAIFENESIN ER 600 MG PO TB12: 1200.0000 mg | ORAL_TABLET | Freq: Two times a day (BID) | ORAL | Status: DC

## 2014-11-24 MED ORDER — LEVOFLOXACIN 750 MG PO TABS: 750.0000 mg | ORAL_TABLET | ORAL | Status: DC

## 2014-11-24 NOTE — Progress Notes (Signed)
  Echocardiogram 2D Echocardiogram has been performed.  Amanda [name], Amanda [name] 11/24/2014, 1:40 PM

## 2014-11-24 NOTE — Progress Notes (Signed)
Physical Therapy Treatment Patient Details Name: Amanda [name] MRN: [id number] DOB: 07/20/1940 Today's Date: 11-28-14    History of Present Illness 74 y.o. female with history of GERD HLD hypothyroid CHF presents to the hospital with increased SOB and Fever. Pt with UTI and CAP    PT Comments    Patient making good gains with mobility and gait.  Able to negotiate stairs today with min guard assist.   Follow Up Recommendations  Home health PT     Equipment Recommendations  Rolling walker with 5" wheels (Patient may be able to borrow one from church)    Recommendations for Other Services       Precautions / Restrictions Precautions Precautions: None Restrictions Weight Bearing Restrictions: No    Mobility  Bed Mobility Overal bed mobility: Independent                Transfers Overall transfer level: Independent Equipment used: None                Ambulation/Gait Ambulation/Gait assistance: Supervision Ambulation Distance (Feet): 200 Feet Assistive device: Rolling walker (2 wheeled) Gait Pattern/deviations: Step-through pattern;Decreased stride length   Gait velocity interpretation: Below normal speed for age/gender General Gait Details: Patient demonstrates safe use of RW with gait.  Good balance during gait.     Stairs Stairs: Yes Stairs assistance: Min guard Stair Management: One rail Right;Step to pattern;Forwards Number of Stairs: 4 General stair comments: Instructed patient to negotiate stairs using step-to pattern and one rail.  Able to complete with min guard assist.  Wheelchair Mobility    Modified Rankin (Stroke Patients Only)       Balance                                    Cognition Arousal/Alertness: Awake/alert Behavior During Therapy: WFL for tasks assessed/performed Overall Cognitive Status: Within Functional Limits for tasks assessed                      Exercises      General Comments         Pertinent Vitals/Pain Pain Assessment: No/denies pain    Home Living                      Prior Function            PT Goals (current goals can now be found in the care plan section) Progress towards PT goals: Progressing toward goals    Frequency  Min 3X/week    PT Plan Current plan remains appropriate    Co-evaluation             End of Session   Activity Tolerance: Patient tolerated treatment well Patient left: in bed;with call bell/phone within reach     Time: 1438-1450 PT Time Calculation (min) (ACUTE ONLY): 12 min  Charges:  $Gait Training: 8-22 mins                    G Codes:      Despina Pole 11/28/14, 6:18 PM Carita Pian. Sanjuana Kava, [place] Pager [phone]

## 2014-11-24 NOTE — Progress Notes (Signed)
Patient Demographics  Amanda [name], is a 74 y.o. female, DOB - September 19, 1940, ALP:[id number]  Admit date - 11/17/2014   Admitting Physician Nita Sells, MD  Outpatient Primary MD for the patient is [name], DO  LOS - 7   Chief Complaint  Patient presents with  . Urinary Tract Infection        Subjective:   Amanda [name] today still has dry cough and feeling weak, oxygen saturation has improved while ambulating, reported feeling nauseous after ambulating, Husband in room.  Assessment & Plan    Principal Problem:   CAP (community acquired pneumonia) Active Problems:   Hypothyroidism   HLD (hyperlipidemia)   GERD (gastroesophageal reflux disease)   Acute respiratory failure with hypoxia  Acute hypoxic respiratory failure - Secondary to legionella pneumonia - persistent infiltrate on cxr, ct chest right  Upper and lower lobe infiltrates associated with mild right pleural effusion consistent with legionella disease.  -clinically improving, off oxygen  Community-acquired pneumonia  due to legionella  - Initially on IV Rocephin and azithromycin, with no improvement, repeat x-ray showing worsening infiltrate, had increased oxygen requirement, so antibiotics was changed to vancomycin and Zosyn (which would not cover Legionella )on 9/17>>9/19, transitioned back to levofloxacin on 9/19 as her legionella antigen was positive. - Legionella  Antigen positive. - blood cultures : No growth to date  -  urine antigen for strep nonreactive  - Continue with Mucinex, flutter valve, pulmonary toilet , chest PT ., clinically improving, changed to oral levaquin  Hypothyroidism - Continue with Synthroid - TSH within normal limits  Hyperlipidemia - Follow on lipid panel   Hypertension - Blood pressure is on the higher side, patient with no history of hypertension, will start on when necessary  hydralazine. Consider close pmd follow up, may need to start on bp meds.  Anemia  - Anemia of chronic illness, iron is 13, ferritin is 1489 , continue to monitor .   hyponatremia - Related to pneumonia, resolved with IV normal saline.  Code Status: Full  Family Communication: Husband at bedside  Disposition Plan: home 9/23   Procedures  None    Consults   None    Medications  Scheduled Meds: . acidophilus  1 capsule Oral Daily  . antiseptic oral rinse  7 mL Mouth Rinse BID  . buPROPion  300 mg Oral Daily  . guaiFENesin  1,200 mg Oral BID  . heparin  5,000 Units Subcutaneous 3 times per day  . ipratropium-albuterol  3 mL Nebulization TID  . levofloxacin  750 mg Oral Q24H  . levothyroxine  125 mcg Oral QAC breakfast  . multivitamin-lutein  1 capsule Oral Daily  . vitamin C  1,000 mg Oral Daily   Continuous Infusions:   PRN Meds:.acetaminophen **OR** acetaminophen, albuterol, hydrALAZINE, magic mouthwash w/lidocaine, ondansetron (ZOFRAN) IV, zolpidem  DVT Prophylaxis Heparin   Lab Results  Component Value Date   PLT 525* 11/24/2014    Antibiotics    Anti-infectives    Start     Dose/Rate Route Frequency Ordered Stop   11/24/14 0000  levofloxacin (LEVAQUIN) 750 MG tablet  Status:  Discontinued     750 mg Oral Every 24 hours 11/24/14 0828 11/24/14    11/24/14 0000  levofloxacin (LEVAQUIN)  750 MG tablet     750 mg Oral Every 24 hours 11/24/14 0859     11/23/14 1400  levofloxacin (LEVAQUIN) tablet 750 mg     750 mg Oral Every 24 hours 11/23/14 0943     11/21/14 1400  levofloxacin (LEVAQUIN) IVPB 750 mg  Status:  Discontinued     750 mg 100 mL/hr over 90 Minutes Intravenous Every 24 hours 11/21/14 1327 11/23/14 0943   11/19/14 1400  piperacillin-tazobactam (ZOSYN) IVPB 3.375 g  Status:  Discontinued     3.375 g 12.5 mL/hr over 240 Minutes Intravenous 3 times per day 11/19/14 1306 11/21/14 1327   11/19/14 1330  vancomycin (VANCOCIN) IVPB 750 mg/150 ml premix   Status:  Discontinued     750 mg 150 mL/hr over 60 Minutes Intravenous Every 12 hours 11/19/14 1306 11/21/14 1327   11/18/14 1730  azithromycin (ZITHROMAX) tablet 500 mg  Status:  Discontinued     500 mg Oral Every 24 hours 11/17/14 2326 11/19/14 1230   11/18/14 1630  cefTRIAXone (ROCEPHIN) 1 g in dextrose 5 % 50 mL IVPB  Status:  Discontinued     1 g 100 mL/hr over 30 Minutes Intravenous Every 24 hours 11/17/14 2326 11/19/14 1230   11/17/14 2330  cefTRIAXone (ROCEPHIN) 1 g in dextrose 5 % 50 mL IVPB  Status:  Discontinued     1 g 100 mL/hr over 30 Minutes Intravenous Every 24 hours 11/17/14 2318 11/17/14 2326   11/17/14 2330  azithromycin (ZITHROMAX) tablet 500 mg  Status:  Discontinued     500 mg Oral Every 24 hours 11/17/14 2318 11/17/14 2326   11/17/14 1725  azithromycin (ZITHROMAX) 500 MG injection    Comments:  Eulogio Ditch   : cabinet override      11/17/14 1725 11/18/14 0529   11/17/14 1630  cefTRIAXone (ROCEPHIN) 1 g in dextrose 5 % 50 mL IVPB     1 g 100 mL/hr over 30 Minutes Intravenous  Once 11/17/14 1626 11/17/14 1724   11/17/14 1630  azithromycin (ZITHROMAX) 500 mg in dextrose 5 % 250 mL IVPB     500 mg 250 mL/hr over 60 Minutes Intravenous  Once 11/17/14 1626 11/17/14 1940   11/17/14 1628  cefTRIAXone (ROCEPHIN) 1 G injection    Comments:  Eulogio Ditch   : cabinet override      11/17/14 1628 11/18/14 0444          Objective:   Filed Vitals:   11/23/14 2218 11/24/14 0553 11/24/14 0739 11/24/14 0929  BP: 147/97 160/73  153/87  Pulse: 77 77  93  Temp: 98.4 F (36.9 C) 97.3 F (36.3 C)  98.4 F (36.9 C)  TempSrc: Oral Oral  Oral  Resp: 16 18  18   Height:      Weight:      SpO2: 91% 96% 96% 96%    Wt Readings from Last 3 Encounters:  11/22/14 145 lb 14.4 oz (66.18 kg)  11/15/14 146 lb (66.225 kg)  06/02/14 154 lb (69.854 kg)     Intake/Output Summary (Last 24 hours) at 11/24/14 1100 Last data filed at 11/24/14 0930  Gross per 24 hour  Intake     360 ml  Output      0 ml  Net    360 ml     Physical Exam  Awake Alert, frail .AT,PERRAL Supple Neck,No JVD, Symmetrical Chest  wall movement , right lung reveals coarse breath sounds No Gallops,Rubs or new Murmurs, No Parasternal Heave +ve  B.Sounds, Abd Soft, No tenderness, No organomegaly appriciated, No Cyanosis, Clubbing or edema, No new Rash or bruise    Data Review   Micro Results Recent Results (from the past 240 hour(s))  Blood culture (routine x 2)     Status: None   Collection Time: 11/15/14  4:07 PM  Result Value Ref Range Status   Specimen Description BLOOD RIGHT ANTECUBITAL  Final   Special Requests BOTTLES DRAWN AEROBIC AND ANAEROBIC 5 ML EACH  Final   Culture   Final    NO GROWTH 5 DAYS Performed at [facility]    Report Status 11/20/2014 FINAL  Final  Urine culture     Status: None   Collection Time: 11/15/14  4:25 PM  Result Value Ref Range Status   Specimen Description URINE, CLEAN CATCH  Final   Special Requests NONE  Final   Culture   Final    MULTIPLE SPECIES PRESENT, SUGGEST RECOLLECTION Performed at [facility]    Report Status 11/17/2014 FINAL  Final  Blood culture (routine x 2)     Status: None   Collection Time: 11/15/14  6:00 PM  Result Value Ref Range Status   Specimen Description BLOOD RIGHT HAND  Final   Special Requests BOTTLES DRAWN AEROBIC AND ANAEROBIC 5ML EACH  Final   Culture   Final    NO GROWTH 5 DAYS Performed at [facility]    Report Status 11/20/2014 FINAL  Final  Culture, blood (routine x 2) Call MD if unable to obtain prior to antibiotics being given     Status: None   Collection Time: 11/18/14 12:42 AM  Result Value Ref Range Status   Specimen Description BLOOD RIGHT ANTECUBITAL  Final   Special Requests BOTTLES DRAWN AEROBIC ONLY 5CC  Final   Culture NO GROWTH 5 DAYS  Final   Report Status 11/23/2014 FINAL  Final  Culture, blood (routine x 2) Call MD if unable to obtain prior to  antibiotics being given     Status: None   Collection Time: 11/18/14 12:57 AM  Result Value Ref Range Status   Specimen Description BLOOD RIGHT ANTECUBITAL  Final   Special Requests IN PEDIATRIC BOTTLE 3CC  Final   Culture NO GROWTH 5 DAYS  Final   Report Status 11/23/2014 FINAL  Final    Radiology Reports Dg Chest 2 View  11/17/2014   CLINICAL DATA:  Cough, fever, body aches, weakness, fatigue. Nausea and loss of appetite since Sunday. History of CHF, pneumonia, shingles.  EXAM: CHEST  2 VIEW  COMPARISON:  11/15/2014  FINDINGS: The heart is enlarged. There is interstitial edema. There has been development of significant right upper lobe and right lower lobe infiltrate, likely infectious. Small bilateral pleural effusions are present.  IMPRESSION: 1. Right upper lobe and right lower lobe infiltrates. 2. Cardiomegaly and mild edema.  Small effusions.   Electronically Signed   By: Nolon Nations M.D.   On: 11/17/2014 16:17   Dg Chest 2 View  11/15/2014   CLINICAL DATA:  Nausea and vomiting for 2 days.  Initial encounter.  EXAM: CHEST  2 VIEW  COMPARISON:  07/10/2012.  FINDINGS: Chronic cardiomegaly. Interstitial pulmonary edema and pulmonary vascular congestion compatible with CHF. No pleural effusions. Thickening of the fissures is present on the lateral projection.  IMPRESSION: Mild CHF.   Electronically Signed   By: Dereck Ligas M.D.   On: 11/15/2014 16:42   Ct Chest Wo Contrast  11/23/2014   CLINICAL DATA:  PNEUMONIA, FEVER, PT. [place] LAST Thursday, PT. EXPLAINS SHE HAS BEEN DIAGNOSED WITH LEGIONAIRE'S DZ, LW  EXAM: CT CHEST WITHOUT CONTRAST  TECHNIQUE: Multidetector CT imaging of the chest was performed following the standard protocol without IV contrast.  COMPARISON:  Chest x-ray 11/22/2014  FINDINGS: Heart: Trace pericardial effusion. Heart size is normal. Coronary artery calcifications are present.  Vascular structures: Ectatic ascending aorta.  Mediastinum/thyroid:  Enlarged lymph nodes in mediastinum. Pretracheal lymph node is 0.8 cm. Right peritracheal lymph node is 0.9 cm. Subcentimeter bilateral hilar lymph nodes are present.  Lungs/Airways: There are bilateral pleural effusions, right greater than left. Airspace filling is identified within the right upper and right lower lobes, associated air bronchograms and likely infectious. There are areas of air trapping within the left lung which is otherwise clear.  Upper abdomen: The gallbladder is present.  Chest wall/osseous structures: Moderate mid thoracic spondylosis. No suspicious lytic or blastic lesions are identified.  IMPRESSION: 1. Right upper lobe and right lower lobe infiltrates associated right pleural effusion. Findings are consistent with infectious process. Legionella can have this appearance. No cavitary abnormalities are identified. 2. Small mediastinal and hilar lymph nodes may be reactive. 3. Coronary artery disease. 4. Ectatic ascending thoracic aorta.   Electronically Signed   By: Nolon Nations M.D.   On: 11/23/2014 19:45   Dg Chest Port 1 View  11/22/2014   CLINICAL DATA:  Dyspnea  EXAM: PORTABLE CHEST - 1 VIEW  COMPARISON:  11/20/2014  FINDINGS: Diffuse bilateral airspace disease has improved. Left upper lung zone is relatively clear. Upper normal heart size. No pneumothorax.  IMPRESSION: Improving bilateral airspace disease.   Electronically Signed   By: Marybelle Killings M.D.   On: 11/22/2014 08:16   Dg Chest Port 1 View  11/20/2014   CLINICAL DATA:  Hypoxia  EXAM: PORTABLE CHEST - 1 VIEW  COMPARISON:  11/17/2014  FINDINGS: Cardiomediastinal silhouette is stable. Persistent hazy opacification in right upper and right lower lobe highly suspicious for asymmetric pneumonia or edema. Minimal left basilar atelectasis. Degenerative changes thoracic spine.  IMPRESSION: Persistent hazy opacification right upper and right lower lobe highly suspicious for asymmetric edema or pneumonia. Mild left basilar  atelectasis. Degenerative changes thoracic spine.   Electronically Signed   By: Lahoma Crocker M.D.   On: 11/20/2014 10:46   Dg Chest Port 1 View  11/18/2014   CLINICAL DATA:  74 year old female with sepsis. History of pneumonia and CHF.  EXAM: PORTABLE CHEST - 1 VIEW  COMPARISON:  Earlier Radiograph dated 11/17/2014 and multiple other radiograph dating back to 07/17/2007  FINDINGS: There is a patchy area of consolidation with air bronchogram in the right mid lung field most compatible with pneumonia. Underlying mass is not excluded. There has been interval progression of opacity compared to the earlier study. Clinical correlation and follow-up resolution recommended. No pleural effusion or pneumothorax. Stable cardiac silhouette. The osseous structures are grossly unremarkable.  IMPRESSION: Slight increase in the right mid lung field airspace opacity. Follow-up recommended.   Electronically Signed   By: Anner Crete M.D.   On: 11/18/2014 00:48     CBC  Recent Labs Lab 11/17/14 1525 11/18/14 0042 11/18/14 0057 11/18/14 0240 11/20/14 0612 11/23/14 0435 11/24/14 0450  WBC 8.5 7.4  --  7.6 8.0 9.7 9.0  HGB 10.9* 10.2*  --  10.1* 9.6* 10.7* 10.3*  HCT 32.0* 30.0* 29.8* 29.4* 28.5* 31.4* 31.6*  PLT 220 210  --  214 305 494* 525*  MCV 79.4  78.7  --  78.6 78.9 80.5 80.8  MCH 27.0 26.8  --  27.0 26.6 27.4 26.3  MCHC 34.1 34.0  --  34.4 33.7 34.1 32.6  RDW 13.6 13.6  --  13.6 14.5 15.3 15.8*  LYMPHSABS 0.9  --   --   --   --   --  1.7  MONOABS 0.8  --   --   --   --   --  0.8  EOSABS 0.0  --   --   --   --   --  0.2  BASOSABS 0.0  --   --   --   --   --  0.0    Chemistries   Recent Labs Lab 11/18/14 0240 11/19/14 0640 11/20/14 0612 11/23/14 0435 11/24/14 0450  NA 127* 133* 136 139 138  K 3.6 3.5 3.6 3.1* 3.5  CL 95* 102 102 98* 98*  CO2 23 24 27 30 31   GLUCOSE 121* 97 108* 109* 114*  BUN 11 9 8 7 8   CREATININE 1.02* 0.80 0.78 0.84 0.88  CALCIUM 7.3* 7.5* 7.9* 8.4* 8.5*  MG  --    --   --   --  1.8  AST 52*  --   --   --  35  ALT 32  --   --   --  49  ALKPHOS 80  --   --   --  101  BILITOT 0.5  --   --   --  0.4   ------------------------------------------------------------------------------------------------------------------ estimated creatinine clearance is 49.3 mL/min (by C-G formula based on Cr of 0.88). ------------------------------------------------------------------------------------------------------------------ No results for input(s): HGBA1C in the last 72 hours. ------------------------------------------------------------------------------------------------------------------  Recent Labs  11/22/14 0550  CHOL 143  HDL 19*  LDLCALC 91  TRIG 164*  CHOLHDL 7.5   ------------------------------------------------------------------------------------------------------------------ No results for input(s): TSH, T4TOTAL, T3FREE, THYROIDAB in the last 72 hours.  Invalid input(s): FREET3 ------------------------------------------------------------------------------------------------------------------ No results for input(s): VITAMINB12, FOLATE, FERRITIN, TIBC, IRON, RETICCTPCT in the last 72 hours.  Coagulation profile  Recent Labs Lab 11/18/14 0042  INR 1.32    No results for input(s): DDIMER in the last 72 hours.  Cardiac Enzymes No results for input(s): CKMB, TROPONINI, MYOGLOBIN in the last 168 hours.  Invalid input(s): CK ------------------------------------------------------------------------------------------------------------------ Invalid input(s): POCBNP     Time Spent in minutes   37 minutes   Xu,Fang MD PhD on 11/24/2014 at 11:00 AM  Between 7am to 7pm - Pager - [phone]  After 7pm go to [email or web address] - password [facility]  [facility]   Office  [phone]

## 2014-11-24 NOTE — Care Management Important Message (Signed)
Important Message  Patient Details  Name: Amanda [name] MRN: [id number] Date of Birth: December 21, 1940   Medicare Important Message Given:  Yes-third notification given    Delorse Lek 11/24/2014, 9:26 AM

## 2014-11-24 NOTE — Care Management Note (Signed)
Case Management Note  Patient Details  Name: ZHANIYA SWALLOWS MRN: [id number] Date of Birth: [birth year]  Subjective/Objective:        CM following for progression and d/c summary.           Action/Plan: 11/24/2014 Met with pt and husband re HH needs, this pt wishes to have HHPT eval she feels that she may not need further PT. She and husband selected [facility] and ask specifically for , Cecile as their therapist. [facility] notified. No other needs identified.   Expected Discharge Date:       11/25/2014           Expected Discharge Plan:  [place]  In-House Referral:  NA  Discharge planning Services  CM Consult  Post Acute Care Choice:  Home Health Choice offered to:  Patient  DME Arranged:   DME Agency:    HH Arranged:  PT HH Agency:   [facility]  Status of Service:  Completed, signed off  Medicare Important Message Given:  Yes-third notification given Date Medicare IM Given:    Medicare IM give by:    Date Additional Medicare IM Given:    Additional Medicare Important Message give by:     If discussed at [place] of Stay Meetings, dates discussed:    Additional Comments:  Adron Bene, RN 11/24/2014, 12:25 PM

## 2014-11-25 LAB — CBC
HEMATOCRIT: 31.9 % — AB (ref 36.0–46.0)
Hemoglobin: 10.7 g/dL — ABNORMAL LOW (ref 12.0–15.0)
MCH: 27.5 pg (ref 26.0–34.0)
MCHC: 33.5 g/dL (ref 30.0–36.0)
MCV: 82 fL (ref 78.0–100.0)
Platelets: 490 10*3/uL — ABNORMAL HIGH (ref 150–400)
RBC: 3.89 MIL/uL (ref 3.87–5.11)
RDW: 16 % — AB (ref 11.5–15.5)
WBC: 9.2 10*3/uL (ref 4.0–10.5)

## 2014-11-25 LAB — BASIC METABOLIC PANEL
Anion gap: 9 (ref 5–15)
BUN: 9 mg/dL (ref 6–20)
CALCIUM: 9.2 mg/dL (ref 8.9–10.3)
CO2: 30 mmol/L (ref 22–32)
CREATININE: 0.85 mg/dL (ref 0.44–1.00)
Chloride: 103 mmol/L (ref 101–111)
GFR calc non Af Amer: >60 mL/min (ref >60)
GLUCOSE: 107 mg/dL — AB (ref 65–99)
Potassium: 4 mmol/L (ref 3.5–5.1)
Sodium: 142 mmol/L (ref 135–145)

## 2014-11-25 LAB — GLUCOSE, CAPILLARY: Glucose-Capillary: 116 mg/dL — ABNORMAL HIGH (ref 65–99)

## 2014-11-25 NOTE — Progress Notes (Signed)
Patient Discharge: Disposition: Patient discharged to home with husband. Education: Reviewed medications, prescriptions, follow-up appointments, and discharge instructions, understood and acknowledged. IV: Discontinued IV before discharge. Telemetry: N/A Transportation: Patient transported in w/c out of the unit accompanied by the staff and husband. Belongings: Patient took all her belongings with her.  

## 2014-11-25 NOTE — Care Management Important Message (Signed)
Important Message  Patient Details  Name: Amanda [name] MRN: [id number] Date of Birth: 03-Jul-1940   Medicare Important Message Given:  Yes-fourth notification given    Loann Quill 11/25/2014, 11:17 AM

## 2014-11-25 NOTE — Progress Notes (Signed)
PT Cancellation Note  Patient Details Name: Amanda [name] MRN: [id number] DOB: Jul 10, 1940   Cancelled Treatment:    Reason Eval/Treat Not Completed: Other (comment).  Patient declined PT today - preparing for d/c.  Patient with no questions for PT.  Completed stair training at last session.   Patient to have f/u HHPT at d/c.   Despina Pole 11/25/2014, 11:22 AM Carita Pian Sanjuana Kava, [place] Pager [phone]

## 2014-11-29 DIAGNOSIS — E039 Hypothyroidism, unspecified: Secondary | ICD-10-CM | POA: Diagnosis not present

## 2014-11-29 DIAGNOSIS — A481 Legionnaires' disease: Secondary | ICD-10-CM | POA: Diagnosis not present

## 2014-11-29 DIAGNOSIS — I509 Heart failure, unspecified: Secondary | ICD-10-CM | POA: Diagnosis not present

## 2014-11-29 DIAGNOSIS — E785 Hyperlipidemia, unspecified: Secondary | ICD-10-CM | POA: Diagnosis not present

## 2014-11-29 DIAGNOSIS — N390 Urinary tract infection, site not specified: Secondary | ICD-10-CM | POA: Diagnosis not present

## 2014-11-29 DIAGNOSIS — D649 Anemia, unspecified: Secondary | ICD-10-CM | POA: Diagnosis not present

## 2014-11-29 DIAGNOSIS — K219 Gastro-esophageal reflux disease without esophagitis: Secondary | ICD-10-CM | POA: Diagnosis not present

## 2014-12-02 DIAGNOSIS — A481 Legionnaires' disease: Secondary | ICD-10-CM | POA: Diagnosis not present

## 2014-12-02 DIAGNOSIS — I509 Heart failure, unspecified: Secondary | ICD-10-CM | POA: Diagnosis not present

## 2014-12-02 DIAGNOSIS — K219 Gastro-esophageal reflux disease without esophagitis: Secondary | ICD-10-CM | POA: Diagnosis not present

## 2014-12-02 DIAGNOSIS — D649 Anemia, unspecified: Secondary | ICD-10-CM | POA: Diagnosis not present

## 2014-12-02 DIAGNOSIS — E785 Hyperlipidemia, unspecified: Secondary | ICD-10-CM | POA: Diagnosis not present

## 2014-12-02 DIAGNOSIS — N390 Urinary tract infection, site not specified: Secondary | ICD-10-CM | POA: Diagnosis not present

## 2014-12-02 NOTE — Discharge Summary (Signed)
Discharge Summary  Amanda [name] HRC:[id number] DOB: May 19, 1940  PCP: Amanda Koyanagi, DO  Admit date: 11/17/2014 Discharge date: 11/25/2014  Time spent: <30mins  Recommendations for Outpatient Follow-up:  1. F/u with PMD within two weeks, pmd to repeat cxr in 3-4 weeks.  Discharge Diagnoses:  Active Hospital Problems   Diagnosis Date Noted  . CAP (community acquired pneumonia) 07/02/2012  . Acute respiratory failure with hypoxia 11/20/2014  . HLD (hyperlipidemia) 11/17/2014  . GERD (gastroesophageal reflux disease) 11/17/2014  . Hypothyroidism 08/06/2006    Resolved Hospital Problems   Diagnosis Date Noted Date Resolved  No resolved problems to display.    Discharge Condition: stable  Diet recommendation: heart healthy/carb modified  Filed Weights   11/20/14 2100 11/22/14 2128 11/24/14 2055  Weight: 151 lb 0.2 oz (68.5 kg) 145 lb 14.4 oz (66.18 kg) 148 lb 13.7 oz (67.52 kg)    History of present illness:  Amanda [name] is a 74 y.o. female with history of GERD HLD hypothyroid CHF presents to the hospital with increased SOB and Fever. She states that she was seen at [facility] for urinary complaints. She was diagnosed with a UTI and sent home. Patient states that she did not really feel much better and her fevers were actually up by wednesday. Patient went back to the ED after being seen by her PCP today. She has been having chills and has been having nausea and vomiting. Patient states that she ha shad poor appetite. Patient states that she has not been able to keep liquids down. She has no diarrhea,. She feels bloated though. She states that she has had some headaches and body aches. She is also having shortness of breath more now. She was around 9 of her grand kids and her husband noted that a couple of them did fall ill also.  Hospital Course:  Principal Problem:   CAP (community acquired pneumonia) Active Problems:   Hypothyroidism   HLD (hyperlipidemia)   GERD (gastroesophageal  reflux disease)   Acute respiratory failure with hypoxia  Acute hypoxic respiratory failure - Secondary to legionella pneumonia - persistent infiltrate on cxr, ct chest right Upper and lower lobe infiltrates associated with mild right pleural effusion consistent with legionella disease.  -clinically improving, off oxygen  Community-acquired pneumonia due to legionella  - Initially on IV Rocephin and azithromycin, with no improvement, repeat x-ray showing worsening infiltrate, had increased oxygen requirement, so antibiotics was changed to vancomycin and Zosyn (which would not cover Legionella )on 9/17>>9/19, transitioned back to levofloxacin on 9/19 as her legionella antigen was positive. - Legionella Antigen positive. - blood cultures : No growth to date  - urine antigen for strep nonreactive  - Continue with Mucinex, flutter valve, pulmonary toilet , chest PT ., clinically improving, changed to oral levaquin  Hypothyroidism - Continue with Synthroid - TSH within normal limits  Hyperlipidemia - Follow on lipid panel   Hypertension - Blood pressure is on the higher side, patient with no history of hypertension, will start on when necessary hydralazine. Consider close pmd follow up, may need to start on bp meds, instructed patient to monitor blood pressure at home, discuss with pmd.  Anemia  - Anemia of chronic illness, iron is 13, ferritin is 1489 , hgb stable.  hyponatremia - Related to pneumonia, resolved with IV normal saline.  Code Status: Full  Family Communication: Husband at bedside  Disposition Plan: home 9/23   Procedures  None    Consults  None    Discharge  Exam: BP 155/65 mmHg  Pulse 72  Temp(Src) 98.1 F (36.7 C) (Oral)  Resp 16  Ht 5\' 2"  (1.575 m)  Wt 148 lb 13.7 oz (67.52 kg)  BMI 27.22 kg/m2  SpO2 96%  Awake Alert, frail Amanda [name],PERRAL Supple Neck,No JVD, Symmetrical Chest wall movement , right lung reveals coarse breath sounds No  Gallops,Rubs or new Murmurs, No Parasternal Heave +ve B.Sounds, Abd Soft, No tenderness, No organomegaly appriciated, No Cyanosis, Clubbing or edema, No new Rash or bruise     Discharge Instructions You were cared for by a hospitalist during your hospital stay. If you have any questions about your discharge medications or the care you received while you were in the hospital after you are discharged, you can call the unit and asked to speak with the hospitalist on call if the hospitalist that took care of you is not available. Once you are discharged, your primary care physician will handle any further medical issues. Please note that NO REFILLS for any discharge medications will be authorized once you are discharged, as it is imperative that you return to your primary care physician (or establish a relationship with a primary care physician if you do not have one) for your aftercare needs so that they can reassess your need for medications and monitor your lab values.  Discharge Instructions    Diet - low sodium heart healthy    Complete by:  As directed      Face-to-face encounter (required for Medicare/Medicaid patients)    Complete by:  As directed   I Xu,Fang certify that this patient is under my care and that I, or a nurse practitioner or physician's assistant working with me, had a face-to-face encounter that meets the physician face-to-face encounter requirements with this patient on 11/23/2014. The encounter with the patient was in whole, or in part for the following medical condition(s) which is the primary reason for home health care (List medical condition): FTT/sob  The encounter with the patient was in whole, or in part, for the following medical condition, which is the primary reason for home health care:  FTt/ weakness  I certify that, based on my findings, the following services are medically necessary home health services:  Physical therapy  Reason for Medically Necessary Home Health  Services:  Other See Comments  My clinical findings support the need for the above services:  Shortness of breath with activity  Further, I certify that my clinical findings support that this patient is homebound due to:  Shortness of Breath with activity     Home Health    Complete by:  As directed   To provide the following care/treatments:  PT     Increase activity slowly    Complete by:  As directed             Medication List    TAKE these medications        buPROPion 300 MG 24 hr tablet  Commonly known as:  WELLBUTRIN XL  Take 1 tablet (300 mg total) by mouth daily.     guaiFENesin 600 MG 12 hr tablet  Commonly known as:  MUCINEX  Take 2 tablets (1,200 mg total) by mouth 2 (two) times daily.     ICAPS Tabs  Take 2 tablets by mouth daily. AREDS 1+2     levofloxacin 750 MG tablet  Commonly known as:  LEVAQUIN  Take 1 tablet (750 mg total) by mouth daily.     levothyroxine 125 MCG tablet  Commonly known as:  SYNTHROID, LEVOTHROID  Take 1 tablet (125 mcg total) by mouth daily before breakfast. Repeat labs are due now     ondansetron 4 MG tablet  Commonly known as:  ZOFRAN  Take 1 tablet (4 mg total) by mouth every 8 (eight) hours as needed for nausea or vomiting.     PROBIOTIC DAILY PO  Take 1 capsule by mouth daily.     Vitamin C CR 1000 MG Tbcr  Take 1 each by mouth daily. EMERGEN-C PACKET       Allergies  Allergen Reactions  . Phenergan [Promethazine Hcl] Other (See Comments)    Jerking/agitation  . Levofloxacin Nausea And Vomiting  . Pravastatin Sodium Nausea And Vomiting       Follow-up Information    Follow up with Amanda Koyanagi, DO On 12/05/2014.   Specialty:  Family Medicine   Why:  hospital discharge follow up, repeat cxr in 3-4weeks   Contact information:   [place] [id number] [phone]        The results of significant diagnostics from this hospitalization (including imaging, microbiology, ancillary and  laboratory) are listed below for reference.    Significant Diagnostic Studies: Dg Chest 2 View  11/17/2014   CLINICAL DATA:  Cough, fever, body aches, weakness, fatigue. Nausea and loss of appetite since Sunday. History of CHF, pneumonia, shingles.  EXAM: CHEST  2 VIEW  COMPARISON:  11/15/2014  FINDINGS: The heart is enlarged. There is interstitial edema. There has been development of significant right upper lobe and right lower lobe infiltrate, likely infectious. Small bilateral pleural effusions are present.  IMPRESSION: 1. Right upper lobe and right lower lobe infiltrates. 2. Cardiomegaly and mild edema.  Small effusions.   Electronically Signed   By: Nolon Nations M.D.   On: 11/17/2014 16:17   Dg Chest 2 View  11/15/2014   CLINICAL DATA:  Nausea and vomiting for 2 days.  Initial encounter.  EXAM: CHEST  2 VIEW  COMPARISON:  07/10/2012.  FINDINGS: Chronic cardiomegaly. Interstitial pulmonary edema and pulmonary vascular congestion compatible with CHF. No pleural effusions. Thickening of the fissures is present on the lateral projection.  IMPRESSION: Mild CHF.   Electronically Signed   By: Dereck Ligas M.D.   On: 11/15/2014 16:42   Ct Chest Wo Contrast  11/23/2014   CLINICAL DATA:  PNEUMONIA, FEVER, PT. [place] LAST Thursday, PT. EXPLAINS SHE HAS BEEN DIAGNOSED WITH LEGIONAIRE'S DZ, LW  EXAM: CT CHEST WITHOUT CONTRAST  TECHNIQUE: Multidetector CT imaging of the chest was performed following the standard protocol without IV contrast.  COMPARISON:  Chest x-ray 11/22/2014  FINDINGS: Heart: Trace pericardial effusion. Heart size is normal. Coronary artery calcifications are present.  Vascular structures: Ectatic ascending aorta.  Mediastinum/thyroid: Enlarged lymph nodes in mediastinum. Pretracheal lymph node is 0.8 cm. Right peritracheal lymph node is 0.9 cm. Subcentimeter bilateral hilar lymph nodes are present.  Lungs/Airways: There are bilateral pleural effusions, right greater  than left. Airspace filling is identified within the right upper and right lower lobes, associated air bronchograms and likely infectious. There are areas of air trapping within the left lung which is otherwise clear.  Upper abdomen: The gallbladder is present.  Chest wall/osseous structures: Moderate mid thoracic spondylosis. No suspicious lytic or blastic lesions are identified.  IMPRESSION: 1. Right upper lobe and right lower lobe infiltrates associated right pleural effusion. Findings are consistent with infectious process. Legionella can have this appearance. No cavitary  abnormalities are identified. 2. Small mediastinal and hilar lymph nodes may be reactive. 3. Coronary artery disease. 4. Ectatic ascending thoracic aorta.   Electronically Signed   By: Nolon Nations M.D.   On: 11/23/2014 19:45   Dg Chest Port 1 View  11/22/2014   CLINICAL DATA:  Dyspnea  EXAM: PORTABLE CHEST - 1 VIEW  COMPARISON:  11/20/2014  FINDINGS: Diffuse bilateral airspace disease has improved. Left upper lung zone is relatively clear. Upper normal heart size. No pneumothorax.  IMPRESSION: Improving bilateral airspace disease.   Electronically Signed   By: Marybelle Killings M.D.   On: 11/22/2014 08:16   Dg Chest Port 1 View  11/20/2014   CLINICAL DATA:  Hypoxia  EXAM: PORTABLE CHEST - 1 VIEW  COMPARISON:  11/17/2014  FINDINGS: Cardiomediastinal silhouette is stable. Persistent hazy opacification in right upper and right lower lobe highly suspicious for asymmetric pneumonia or edema. Minimal left basilar atelectasis. Degenerative changes thoracic spine.  IMPRESSION: Persistent hazy opacification right upper and right lower lobe highly suspicious for asymmetric edema or pneumonia. Mild left basilar atelectasis. Degenerative changes thoracic spine.   Electronically Signed   By: Lahoma Crocker M.D.   On: 11/20/2014 10:46   Dg Chest Port 1 View  11/18/2014   CLINICAL DATA:  74 year old female with sepsis. History of pneumonia and CHF.   EXAM: PORTABLE CHEST - 1 VIEW  COMPARISON:  Earlier Radiograph dated 11/17/2014 and multiple other radiograph dating back to 07/17/2007  FINDINGS: There is a patchy area of consolidation with air bronchogram in the right mid lung field most compatible with pneumonia. Underlying mass is not excluded. There has been interval progression of opacity compared to the earlier study. Clinical correlation and follow-up resolution recommended. No pleural effusion or pneumothorax. Stable cardiac silhouette. The osseous structures are grossly unremarkable.  IMPRESSION: Slight increase in the right mid lung field airspace opacity. Follow-up recommended.   Electronically Signed   By: Anner Crete M.D.   On: 11/18/2014 00:48    Microbiology: No results found for this or any previous visit (from the past 240 hour(s)).   Labs: Basic Metabolic Panel: No results for input(s): NA, K, CL, CO2, GLUCOSE, BUN, CREATININE, CALCIUM, MG, PHOS in the last 168 hours. Liver Function Tests: No results for input(s): AST, ALT, ALKPHOS, BILITOT, PROT, ALBUMIN in the last 168 hours. No results for input(s): LIPASE, AMYLASE in the last 168 hours. No results for input(s): AMMONIA in the last 168 hours. CBC: No results for input(s): WBC, NEUTROABS, HGB, HCT, MCV, PLT in the last 168 hours. Cardiac Enzymes: No results for input(s): CKTOTAL, CKMB, CKMBINDEX, TROPONINI in the last 168 hours. BNP: BNP (last 3 results) No results for input(s): BNP in the last 8760 hours.  ProBNP (last 3 results) No results for input(s): PROBNP in the last 8760 hours.  CBG: No results for input(s): GLUCAP in the last 168 hours.     SignedFlorencia Reasons MD, PhD  [facility] 12/02/2014, 3:22 PM

## 2014-12-05 ENCOUNTER — Ambulatory Visit (INDEPENDENT_AMBULATORY_CARE_PROVIDER_SITE_OTHER): Payer: Medicare Other | Admitting: Family Medicine

## 2014-12-05 ENCOUNTER — Encounter: Payer: Self-pay | Admitting: Family Medicine

## 2014-12-05 VITALS — BP 112/72 | HR 72 | Temp 98.2°F | Wt 142.8 lb

## 2014-12-05 DIAGNOSIS — J189 Pneumonia, unspecified organism: Secondary | ICD-10-CM

## 2014-12-05 DIAGNOSIS — I509 Heart failure, unspecified: Secondary | ICD-10-CM | POA: Diagnosis not present

## 2014-12-05 DIAGNOSIS — D649 Anemia, unspecified: Secondary | ICD-10-CM | POA: Diagnosis not present

## 2014-12-05 DIAGNOSIS — K219 Gastro-esophageal reflux disease without esophagitis: Secondary | ICD-10-CM | POA: Diagnosis not present

## 2014-12-05 DIAGNOSIS — A481 Legionnaires' disease: Secondary | ICD-10-CM

## 2014-12-05 DIAGNOSIS — E785 Hyperlipidemia, unspecified: Secondary | ICD-10-CM | POA: Diagnosis not present

## 2014-12-05 DIAGNOSIS — Z23 Encounter for immunization: Secondary | ICD-10-CM

## 2014-12-05 DIAGNOSIS — N390 Urinary tract infection, site not specified: Secondary | ICD-10-CM | POA: Diagnosis not present

## 2014-12-05 MED ORDER — LEVOTHYROXINE SODIUM 125 MCG PO TABS: 125.0000 ug | ORAL_TABLET | Freq: Every day | ORAL | Status: DC

## 2014-12-05 NOTE — Patient Instructions (Signed)

## 2014-12-05 NOTE — Progress Notes (Signed)
Pre visit review using our clinic review tool, if applicable. No additional management support is needed unless otherwise documented below in the visit note. 

## 2014-12-05 NOTE — Progress Notes (Signed)
Patient ID: Amanda [name], female   DOB: April 28, 1940, 74 y.o.   MRN: [id number]   Subjective:    Patient ID: Amanda [name], female    DOB: May 28, 1940, 74 y.o.   MRN: [id number]  Chief Complaint  Patient presents with  . Hospitalization Follow-up    ED follow up    HPI Patient is in today for f/u hosp for legionella pneumonia.  She was admitted 9/15-  D/c 9/23. abx are finished.    Past Medical History  Diagnosis Date  . Hypothyroidism   . Hyperlipidemia   . Constipation, chronic   . PONV (postoperative nausea and vomiting)     needs little anesthesia  . GERD (gastroesophageal reflux disease)     zantac  . History of blood transfusion [place]  . Shortness of breath     on exertion  . SUI (stress urinary incontinence, female)   . Shingles   . Macular degeneration 2013    Both eyes   . Pneumonia   . CHF (congestive heart failure) [facility])     Past Surgical History  Procedure Laterality Date  . Tonsillectomy and adenoidectomy    . Cesarean section  1959  . Wisdom tooth extraction    . Cataract extraction  2009, 2011    BOTH EYES  . Tubal ligation      BY LAPAROSCOPY  . Dilation and curettage of uterus      X2  . Carpal tunnel release  1999  . Colonoscopy       Dr Cristina Gong  . Hysteroscopy w/d&c  01/07/2012    Procedure: DILATATION AND CURETTAGE /HYSTEROSCOPY;  Surgeon: Terrance Mass, MD;  Location: [facility];  Service: Gynecology;  Laterality: N/A;  intrauterine foley catheter for tamponode     Family History  Problem Relation Age of Onset  . Ovarian cancer Mother   . Breast cancer Mother 79  . Hypertension Father   . Prostate cancer Father   . Kidney failure Father   . Diabetes Father   . Hyperlipidemia Brother   . COPD Paternal Grandfather   . Stroke Maternal Grandfather     Social History   Social History  . Marital Status: Married    Spouse Name: N/A  . Number of Children: N/A  . Years of Education: N/A   Occupational History  . Not on  file.   Social History Main Topics  . Smoking status: Never Smoker   . Smokeless tobacco: Never Used  . Alcohol Use: 0.0 oz/week    0 Standard drinks or equivalent per week     Comment: Red wine occasionally  . Drug Use: No  . Sexual Activity: Yes    Birth Control/ Protection: Post-menopausal   Other Topics Concern  . Not on file   Social History Narrative    Outpatient Prescriptions Prior to Visit  Medication Sig Dispense Refill  . Ascorbic Acid (VITAMIN C CR) 1000 MG TBCR Take 1 each by mouth daily. EMERGEN-C PACKET    . buPROPion (WELLBUTRIN XL) 300 MG 24 hr tablet Take 1 tablet (300 mg total) by mouth daily. 90 tablet 3  . Multiple Vitamins-Minerals (ICAPS) TABS Take 2 tablets by mouth daily. AREDS 1+2    . Probiotic Product (PROBIOTIC DAILY PO) Take 1 capsule by mouth daily.     [name] guaiFENesin (MUCINEX) 600 MG 12 hr tablet Take 2 tablets (1,200 mg total) by mouth 2 (two) times daily. 14 tablet 0  . levofloxacin (  LEVAQUIN) 750 MG tablet Take 1 tablet (750 mg total) by mouth daily. 8 tablet 0  . levothyroxine (SYNTHROID, LEVOTHROID) 125 MCG tablet Take 1 tablet (125 mcg total) by mouth daily before breakfast. Repeat labs are due now 90 tablet 0  . ondansetron (ZOFRAN) 4 MG tablet Take 1 tablet (4 mg total) by mouth every 8 (eight) hours as needed for nausea or vomiting. 12 tablet 0   No facility-administered medications prior to visit.    Allergies  Allergen Reactions  . Phenergan [Promethazine Hcl] Other (See Comments)    Jerking/agitation  . Levofloxacin Nausea And Vomiting  . Pravastatin Sodium Nausea And Vomiting    Review of Systems  Constitutional: Negative for fever and malaise/fatigue.  HENT: Negative for congestion.   Eyes: Negative for discharge.  Respiratory: Negative for shortness of breath.   Cardiovascular: Negative for chest pain, palpitations and leg swelling.  Gastrointestinal: Negative for nausea and abdominal pain.  Genitourinary: Negative for  dysuria.  Musculoskeletal: Negative for falls.  Skin: Negative for rash.  Neurological: Negative for loss of consciousness and headaches.  Endo/Heme/Allergies: Negative for environmental allergies.  Psychiatric/Behavioral: Negative for depression. The patient is not nervous/anxious.        Objective:    Physical Exam  Constitutional: She is oriented to person, place, and time. She appears well-developed and well-nourished.  HENT:  Head: Normocephalic and atraumatic.  Eyes: Conjunctivae and EOM are normal.  Neck: Normal range of motion. Neck supple. No JVD present. Carotid bruit is not present. No thyromegaly present.  Cardiovascular: Normal rate, regular rhythm and normal heart sounds.   No murmur heard. Pulmonary/Chest: Effort normal and breath sounds normal. No respiratory distress. She has no wheezes. She has no rales. She exhibits no tenderness.  Musculoskeletal: She exhibits no edema.  Neurological: She is alert and oriented to person, place, and time.  Psychiatric: She has a normal mood and affect.    BP 112/72 mmHg  Pulse 72  Temp(Src) 98.2 F (36.8 C) (Oral)  Wt 142 lb 12.8 oz (64.774 kg)  SpO2 96% Wt Readings from Last 3 Encounters:  12/05/14 142 lb 12.8 oz (64.774 kg)  11/24/14 148 lb 13.7 oz (67.52 kg)  11/15/14 146 lb (66.225 kg)     Lab Results  Component Value Date   WBC 9.2 11/25/2014   HGB 10.7* 11/25/2014   HCT 31.9* 11/25/2014   PLT 490* 11/25/2014   GLUCOSE 107* 11/25/2014   CHOL 143 11/22/2014   TRIG 164* 11/22/2014   HDL 19* 11/22/2014   LDLDIRECT 142.8 11/22/2010   LDLCALC 91 11/22/2014   ALT 49 11/24/2014   AST 35 11/24/2014   NA 142 11/25/2014   K 4.0 11/25/2014   CL 103 11/25/2014   CREATININE 0.85 11/25/2014   BUN 9 11/25/2014   CO2 30 11/25/2014   TSH 2.018 11/18/2014   INR 1.32 11/18/2014   HGBA1C 6.1* 11/18/2014    Lab Results  Component Value Date   TSH 2.018 11/18/2014   Lab Results  Component Value Date   WBC 9.2  11/25/2014   HGB 10.7* 11/25/2014   HCT 31.9* 11/25/2014   MCV 82.0 11/25/2014   PLT 490* 11/25/2014   Lab Results  Component Value Date   NA 142 11/25/2014   K 4.0 11/25/2014   CO2 30 11/25/2014   GLUCOSE 107* 11/25/2014   BUN 9 11/25/2014   CREATININE 0.85 11/25/2014   BILITOT 0.4 11/24/2014   ALKPHOS 101 11/24/2014   AST 35 11/24/2014  ALT 49 11/24/2014   PROT 5.3* 11/24/2014   ALBUMIN 2.5* 11/24/2014   CALCIUM 9.2 11/25/2014   ANIONGAP 9 11/25/2014   GFR 71.64 11/29/2011   Lab Results  Component Value Date   CHOL 143 11/22/2014   Lab Results  Component Value Date   HDL 19* 11/22/2014   Lab Results  Component Value Date   LDLCALC 91 11/22/2014   Lab Results  Component Value Date   TRIG 164* 11/22/2014   Lab Results  Component Value Date   CHOLHDL 7.5 11/22/2014   Lab Results  Component Value Date   HGBA1C 6.1* 11/18/2014       Assessment & Plan:   Problem List Items Addressed This Visit    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[place])    abx finished Pt tired but feeling much better Check xray in 2 weeks Call or rto prn      CAP (community acquired pneumonia) - Primary   Relevant Orders   DG Chest 2 View    Other Visit Diagnoses    Need for prophylactic vaccination and inoculation against influenza        Relevant Orders    Flu vaccine HIGH DOSE PF (Fluzone High dose) (Completed)       I have discontinued Ms. Ludlam's ondansetron, levofloxacin, and guaiFENesin. I have also changed her levothyroxine. Additionally, I am having her maintain her ICAPS, Vitamin C CR, Probiotic Product (PROBIOTIC DAILY PO), and buPROPion.  Meds ordered this encounter  Medications  . levothyroxine (SYNTHROID, LEVOTHROID) 125 MCG tablet    Sig: Take 1 tablet (125 mcg total) by mouth daily before breakfast.    Dispense:  90 tablet    Refill:  [place], DO

## 2014-12-06 NOTE — Assessment & Plan Note (Signed)
abx finished Pt tired but feeling much better Check xray in 2 weeks Call or rto prn

## 2014-12-08 DIAGNOSIS — E785 Hyperlipidemia, unspecified: Secondary | ICD-10-CM | POA: Diagnosis not present

## 2014-12-08 DIAGNOSIS — N390 Urinary tract infection, site not specified: Secondary | ICD-10-CM | POA: Diagnosis not present

## 2014-12-08 DIAGNOSIS — A481 Legionnaires' disease: Secondary | ICD-10-CM | POA: Diagnosis not present

## 2014-12-08 DIAGNOSIS — D649 Anemia, unspecified: Secondary | ICD-10-CM | POA: Diagnosis not present

## 2014-12-08 DIAGNOSIS — I509 Heart failure, unspecified: Secondary | ICD-10-CM | POA: Diagnosis not present

## 2014-12-08 DIAGNOSIS — K219 Gastro-esophageal reflux disease without esophagitis: Secondary | ICD-10-CM | POA: Diagnosis not present

## 2014-12-14 DIAGNOSIS — E785 Hyperlipidemia, unspecified: Secondary | ICD-10-CM | POA: Diagnosis not present

## 2014-12-14 DIAGNOSIS — A481 Legionnaires' disease: Secondary | ICD-10-CM | POA: Diagnosis not present

## 2014-12-14 DIAGNOSIS — N390 Urinary tract infection, site not specified: Secondary | ICD-10-CM | POA: Diagnosis not present

## 2014-12-14 DIAGNOSIS — K219 Gastro-esophageal reflux disease without esophagitis: Secondary | ICD-10-CM | POA: Diagnosis not present

## 2014-12-14 DIAGNOSIS — I509 Heart failure, unspecified: Secondary | ICD-10-CM | POA: Diagnosis not present

## 2014-12-14 DIAGNOSIS — D649 Anemia, unspecified: Secondary | ICD-10-CM | POA: Diagnosis not present

## 2014-12-16 ENCOUNTER — Telehealth: Payer: Self-pay | Admitting: *Deleted

## 2014-12-16 NOTE — Telephone Encounter (Signed)
Home health certification and plan of care received via fax from [facility] for [place] period: 03/18/70 - 01/27/15. Forwarded to Dr. Etter Sjogren. JG//CMA

## 2014-12-23 ENCOUNTER — Ambulatory Visit (HOSPITAL_BASED_OUTPATIENT_CLINIC_OR_DEPARTMENT_OTHER)
Admission: RE | Admit: 2014-12-23 | Discharge: 2014-12-23 | Disposition: A | Discharge status: Home / Self Care | Payer: Medicare Other | Source: Ambulatory Visit | Attending: Family Medicine | Admitting: Family Medicine

## 2014-12-23 DIAGNOSIS — J189 Pneumonia, unspecified organism: Secondary | ICD-10-CM

## 2014-12-23 DIAGNOSIS — J918 Pleural effusion in other conditions classified elsewhere: Secondary | ICD-10-CM | POA: Diagnosis not present

## 2014-12-23 DIAGNOSIS — R918 Other nonspecific abnormal finding of lung field: Secondary | ICD-10-CM | POA: Insufficient documentation

## 2014-12-29 DIAGNOSIS — H40013 Open angle with borderline findings, low risk, bilateral: Secondary | ICD-10-CM | POA: Diagnosis not present

## 2015-02-22 ENCOUNTER — Ambulatory Visit (INDEPENDENT_AMBULATORY_CARE_PROVIDER_SITE_OTHER): Payer: Medicare Other | Admitting: Medical

## 2015-02-22 ENCOUNTER — Encounter: Payer: Self-pay | Admitting: Medical

## 2015-02-22 VITALS — BP 118/72 | HR 77 | Temp 98.1°F | Ht 61.75 in | Wt 147.6 lb

## 2015-02-22 DIAGNOSIS — F329 Major depressive disorder, single episode, unspecified: Secondary | ICD-10-CM

## 2015-02-22 DIAGNOSIS — G5602 Carpal tunnel syndrome, left upper limb: Secondary | ICD-10-CM

## 2015-02-22 DIAGNOSIS — M659 Synovitis and tenosynovitis, unspecified: Secondary | ICD-10-CM

## 2015-02-22 DIAGNOSIS — F32A Depression, unspecified: Secondary | ICD-10-CM

## 2015-02-22 MED ORDER — DICLOFENAC SODIUM 75 MG PO TBEC: 75.0000 mg | DELAYED_RELEASE_TABLET | Freq: Two times a day (BID) | ORAL | Status: DC

## 2015-02-22 MED ORDER — BUPROPION HCL ER (XL) 300 MG PO TB24: 300.0000 mg | ORAL_TABLET | Freq: Every day | ORAL | Status: DC

## 2015-02-22 NOTE — Patient Instructions (Addendum)
Will rx diclofenac rx and use wrist cock up splint.  Follow up 10 days or prn  If pain persists then refer to orthopedist.  Regarding your neck pain you declined mri. With low pain and no radiating symptoms this may not be indicated but if your symptoms worsen let us know.

## 2015-02-22 NOTE — Progress Notes (Signed)
Pre visit review using our clinic review tool, if applicable. No additional management support is needed unless otherwise documented below in the visit note. 

## 2015-02-22 NOTE — Progress Notes (Signed)
Subjective:    Patient ID: Amanda [name], female    DOB: 1940-12-05, 74 y.o.   MRN: [id number]  HPI  Pt has some left hand tingling for 3 wks .She notes seems worse with when she holds her cell phone symptoms seems worse. Pt states her hand does not tingle at night. When tingles can rub her hand and it stops. Pt has history of some neck pain pain but not reporting radicular type pain.   With tingling has normal function. And associated neurologic signs or symptoms.    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 Felt very similar to that per pt. She got surgery and symptoms resolved.  Daily chronic neck pain. Severe djd. Old xray radiologist mentioned getting MRI.   Review of Systems  Constitutional: Negative for fever, chills and fatigue.  Respiratory: Negative for cough, chest tightness, shortness of breath and wheezing.   Cardiovascular: Negative for chest pain and palpitations.  Musculoskeletal: Positive for neck pain and neck stiffness. Negative for back pain, joint swelling and gait problem.       Lt hand tingling. sensation  Skin: Negative for rash.  Neurological: Negative for dizziness, seizures, syncope, weakness, numbness and headaches.  Hematological: Negative for adenopathy. Does not bruise/bleed easily.  Psychiatric/Behavioral: Negative for behavioral problems and confusion.   Past Medical History  Diagnosis Date  . Hypothyroidism   . Hyperlipidemia   . Constipation, chronic   . PONV (postoperative nausea and vomiting)     needs little anesthesia  . GERD (gastroesophageal reflux disease)     zantac  . History of blood transfusion [place]  . Shortness of breath     on exertion  . SUI (stress urinary incontinence, female)   . Shingles   . Macular degeneration 2013    Both eyes   . Pneumonia   . CHF (congestive heart failure) ([place])     Social History   Social History  . Marital Status: Married    Spouse Name: N/A  .  Number of Children: N/A  . Years of Education: N/A   Occupational History  . Not on file.   Social History Main Topics  . Smoking status: Never Smoker   . Smokeless tobacco: Never Used  . Alcohol Use: 0.0 oz/week    0 Standard drinks or equivalent per week     Comment: Red wine occasionally  . Drug Use: No  . Sexual Activity: Yes    Birth Control/ Protection: Post-menopausal   Other Topics Concern  . Not on file   Social History Narrative    Past Surgical History  Procedure Laterality Date  . Tonsillectomy and adenoidectomy    . Cesarean section  1959  . Wisdom tooth extraction    . Cataract extraction  2009, 2011    BOTH EYES  . Tubal ligation      BY LAPAROSCOPY  . Dilation and curettage of uterus      X2  . Carpal tunnel release  1999  . Colonoscopy       Dr Cristina Gong  . Hysteroscopy w/d&c  01/07/2012    Procedure: DILATATION AND CURETTAGE /HYSTEROSCOPY;  Surgeon: Terrance Mass, MD;  Location: [facility];  Service: Gynecology;  Laterality: N/A;  intrauterine foley catheter for tamponode     Family History  Problem Relation Age of Onset  . Ovarian cancer Mother   . Breast cancer Mother 58  . Hypertension Father   .  Prostate cancer Father   . Kidney failure Father   . Diabetes Father   . Hyperlipidemia Brother   . COPD Paternal Grandfather   . Stroke Maternal Grandfather     Allergies  Allergen Reactions  . Phenergan [Promethazine Hcl] Other (See Comments)    Jerking/agitation  . Levofloxacin Nausea And Vomiting  . Pravastatin Sodium Nausea And Vomiting    Current Outpatient Prescriptions on File Prior to Visit  Medication Sig Dispense Refill  . Ascorbic Acid (VITAMIN C CR) 1000 MG TBCR Take 1 each by mouth daily. EMERGEN-C PACKET    . levothyroxine (SYNTHROID, LEVOTHROID) 125 MCG tablet Take 1 tablet (125 mcg total) by mouth daily before breakfast. 90 tablet 1  . Multiple Vitamins-Minerals (ICAPS) TABS Take 2 tablets by mouth daily. AREDS 1+2    .  Probiotic Product (PROBIOTIC DAILY PO) Take 1 capsule by mouth daily.      No current facility-administered medications on file prior to visit.    BP 118/72 mmHg  Pulse 77  Temp(Src) 98.1 F (36.7 C) (Oral)  Ht 5' 1.75" (1.568 m)  Wt 147 lb 9.6 oz (66.951 kg)  BMI 27.23 kg/m2  SpO2 98%       Objective:   Physical Exam  General  Mental Status - Alert. General Appearance - Well groomed. Not in acute distress.  Skin Rashes- No Rashes.   Neck Neck- Supple. No Masses. Movement limited and rigid.   Chest and Lung Exam Auscultation: Breath Sounds:- even and unlabored,   Cardiovascular Auscultation:Rythm- Regular, rate and rhythm. Murmurs & Other Heart Sounds:Ausculatation of the heart reveal- No Murmurs.  Lymphatic Head & Neck General Head & Neck Lymphatics: Bilateral: Description- No Localized lymphadenopathy.  Lt upper ext/wrist- + phalens sign, +finkelstein test. Hands not swollen or read. Normal sensation and movement.       Assessment & Plan:  Will rx diclofenac rx and use wrist cock up splint.  Follow up 10 days or prn  If pain persists then refer to orthopedist.  Regarding your neck pain you declined mri. With low pain and no radiating symptoms this may not be indicated but if your symptoms worsen let us know.

## 2015-02-28 ENCOUNTER — Other Ambulatory Visit: Payer: Self-pay | Admitting: Family Medicine

## 2015-02-28 ENCOUNTER — Telehealth: Payer: Self-pay | Admitting: Family Medicine

## 2015-02-28 DIAGNOSIS — M542 Cervicalgia: Secondary | ICD-10-CM

## 2015-02-28 NOTE — Telephone Encounter (Signed)
Left message informing patient referral has been placed. 

## 2015-02-28 NOTE — Telephone Encounter (Signed)
Referral in

## 2015-02-28 NOTE — Telephone Encounter (Signed)
Caller name: Faiga   Relationship to patient: Self  Can be reached: [phone]  Reason for call: pt says that at her visit she and the provider spoke about a referral for a MRI. Pt says that she declined at the time but would like to have that done now. Please assist further.

## 2015-03-08 ENCOUNTER — Encounter: Payer: Self-pay | Admitting: Medical

## 2015-03-08 ENCOUNTER — Ambulatory Visit (INDEPENDENT_AMBULATORY_CARE_PROVIDER_SITE_OTHER): Payer: Medicare Other | Admitting: Medical

## 2015-03-08 ENCOUNTER — Other Ambulatory Visit: Payer: Self-pay | Admitting: Medical

## 2015-03-08 VITALS — BP 138/78 | HR 79 | Temp 98.9°F | Wt 148.5 lb

## 2015-03-08 DIAGNOSIS — R059 Cough, unspecified: Secondary | ICD-10-CM

## 2015-03-08 DIAGNOSIS — J209 Acute bronchitis, unspecified: Secondary | ICD-10-CM | POA: Diagnosis not present

## 2015-03-08 DIAGNOSIS — J0100 Acute maxillary sinusitis, unspecified: Secondary | ICD-10-CM

## 2015-03-08 DIAGNOSIS — R05 Cough: Secondary | ICD-10-CM

## 2015-03-08 MED ORDER — AZITHROMYCIN 250 MG PO TABS: ORAL_TABLET | ORAL | Status: DC

## 2015-03-08 MED ORDER — BENZONATATE 100 MG PO CAPS: 100.0000 mg | ORAL_CAPSULE | Freq: Three times a day (TID) | ORAL | Status: DC | PRN

## 2015-03-08 MED ORDER — FLUTICASONE PROPIONATE 50 MCG/ACT NA SUSP: 2.0000 | Freq: Every day | NASAL | Status: DC

## 2015-03-08 NOTE — Progress Notes (Signed)
Subjective:    Patient ID: Amanda [name], female    DOB: 11-02-1940, 75 y.o.   MRN: [id number]  HPI  Pt in for evaluation recent illness. Pt sick for 5 days.  Pt states early on very dry cough for 2 days.  Last 3 days bringing up mucous when she coughs with some chest congestion.  St but seems to occur with cough.   No body achiness. No sweats. Some chills. Subjective low grade fever likely per pt.  Rt maxillary sinus pressure earlier in the week.   Review of Systems  Constitutional: Positive for fever and chills. Negative for fatigue.  HENT: Positive for congestion and sinus pressure. Negative for ear pain.        See hpi.  Respiratory: Positive for cough.        Chest congestion.  Cardiovascular: Negative for chest pain and palpitations.  Gastrointestinal: Negative for abdominal pain.  Musculoskeletal: Negative for myalgias and back pain.  Neurological: Negative for headaches.  Hematological: Negative for adenopathy. Does not bruise/bleed easily.   Past Medical History  Diagnosis Date  . Hypothyroidism   . Hyperlipidemia   . Constipation, chronic   . PONV (postoperative nausea and vomiting)     needs little anesthesia  . GERD (gastroesophageal reflux disease)     zantac  . History of blood transfusion [place]  . Shortness of breath     on exertion  . SUI (stress urinary incontinence, female)   . Shingles   . Macular degeneration 2013    Both eyes   . Pneumonia   . CHF (congestive heart failure) ([place])     Social History   Social History  . Marital Status: Married    Spouse Name: N/A  . Number of Children: N/A  . Years of Education: N/A   Occupational History  . Not on file.   Social History Main Topics  . Smoking status: Never Smoker   . Smokeless tobacco: Never Used  . Alcohol Use: 0.0 oz/week    0 Standard drinks or equivalent per week     Comment: Red wine occasionally  . Drug Use: No  . Sexual Activity: Yes    Birth Control/  Protection: Post-menopausal   Other Topics Concern  . Not on file   Social History Narrative    Past Surgical History  Procedure Laterality Date  . Tonsillectomy and adenoidectomy    . Cesarean section  1959  . Wisdom tooth extraction    . Cataract extraction  2009, 2011    BOTH EYES  . Tubal ligation      BY LAPAROSCOPY  . Dilation and curettage of uterus      X2  . Carpal tunnel release  1999  . Colonoscopy       Dr Cristina Gong  . Hysteroscopy w/d&c  01/07/2012    Procedure: DILATATION AND CURETTAGE /HYSTEROSCOPY;  Surgeon: Terrance Mass, MD;  Location: [facility];  Service: Gynecology;  Laterality: N/A;  intrauterine foley catheter for tamponode     Family History  Problem Relation Age of Onset  . Ovarian cancer Mother   . Breast cancer Mother 69  . Hypertension Father   . Prostate cancer Father   . Kidney failure Father   . Diabetes Father   . Hyperlipidemia Brother   . COPD Paternal Grandfather   . Stroke Maternal Grandfather     Allergies  Allergen Reactions  . Phenergan [Promethazine Hcl] Other (See Comments)  Jerking/agitation  . Levofloxacin Nausea And Vomiting  . Pravastatin Sodium Nausea And Vomiting    Current Outpatient Prescriptions on File Prior to Visit  Medication Sig Dispense Refill  . Ascorbic Acid (VITAMIN C CR) 1000 MG TBCR Take 1 each by mouth daily. EMERGEN-C PACKET    . buPROPion (WELLBUTRIN XL) 300 MG 24 hr tablet Take 1 tablet (300 mg total) by mouth daily. 90 tablet 1  . levothyroxine (SYNTHROID, LEVOTHROID) 125 MCG tablet Take 1 tablet (125 mcg total) by mouth daily before breakfast. 90 tablet 1  . Multiple Vitamins-Minerals (ICAPS) TABS Take 2 tablets by mouth daily. AREDS 1+2    . Probiotic Product (PROBIOTIC DAILY PO) Take 1 capsule by mouth daily.     . diclofenac (VOLTAREN) 75 MG EC tablet Take 1 tablet (75 mg total) by mouth 2 (two) times daily. (Patient not taking: Reported on 03/08/2015) 20 tablet 0   No current  facility-administered medications on file prior to visit.    BP 138/78 mmHg  Pulse 79  Temp(Src) 98.9 F (37.2 C) (Oral)  Wt 148 lb 8 oz (67.359 kg)  SpO2 98%       Objective:   Physical Exam  General  Mental Status - Alert. General Appearance - Well groomed. Not in acute distress.  Skin Rashes- No Rashes.  HEENT Head- Normal. Ear Auditory Canal - Left- Normal. Right - Normal.Tympanic Membrane- Left- Normal. Right- Normal. Eye Sclera/Conjunctiva- Left- Normal. Right- Normal. Nose & Sinuses Nasal Mucosa- Left-  Mild  boggy and  Congested. Right-  Mild boggy and Congested. Mouth & Throat Lips: Upper Lip- Normal: no dryness, cracking, pallor, cyanosis, or vesicular eruption. Lower Lip-Normal: no dryness, cracking, pallor, cyanosis or vesicular eruption. Buccal Mucosa- Bilateral- No Aphthous ulcers. Oropharynx- No Discharge or Erythema. Tonsils: Characteristics- Bilateral- No Erythema or Congestion. Size/Enlargement- Bilateral- No enlargement. Discharge- bilateral-None.  Neck Neck- Supple. No Masses.   Chest and Lung Exam Auscultation: Breath Sounds:- even and unlabored  Cardiovascular Auscultation:Rythm- Regular, rate and rhythm. Murmurs & Other Heart Sounds:Ausculatation of the heart reveal- No Murmurs.  Lymphatic Head & Neck General Head & Neck Lymphatics: Bilateral: Description- No Localized lymphadenopathy.       Assessment & Plan:  For bronchitis rx of azithromycin antibiotic. This can covers sinus infection as well.  For cough rx benzonatate.  For nasal congestion rx flonase.  If chest congestion persists despite the above then recommend cxr. Not indicated presently.  Follow up in 7 days or as needed

## 2015-03-08 NOTE — Progress Notes (Signed)
Pre visit review using our clinic review tool, if applicable. No additional management support is needed unless otherwise documented below in the visit note. 

## 2015-03-08 NOTE — Patient Instructions (Signed)
For bronchitis rx of azithromycin antibiotic. This can covers sinus infection as well.  For cough rx benzonatate.  For nasal congestion rx flonase.  If chest congestion persists despite the above then recommend cxr. Not indicated presently.  Follow up in 7 days or as needed

## 2015-03-11 ENCOUNTER — Ambulatory Visit (HOSPITAL_BASED_OUTPATIENT_CLINIC_OR_DEPARTMENT_OTHER): Payer: Medicare Other

## 2015-03-17 ENCOUNTER — Other Ambulatory Visit: Payer: Self-pay | Admitting: Family Medicine

## 2015-03-17 DIAGNOSIS — Z1231 Encounter for screening mammogram for malignant neoplasm of breast: Secondary | ICD-10-CM

## 2015-03-18 ENCOUNTER — Ambulatory Visit (HOSPITAL_BASED_OUTPATIENT_CLINIC_OR_DEPARTMENT_OTHER)
Admission: RE | Admit: 2015-03-18 | Discharge: 2015-03-18 | Disposition: A | Discharge status: Home / Self Care | Payer: Medicare Other | Source: Ambulatory Visit | Attending: Family Medicine | Admitting: Family Medicine

## 2015-03-18 DIAGNOSIS — M47892 Other spondylosis, cervical region: Secondary | ICD-10-CM | POA: Diagnosis not present

## 2015-03-18 DIAGNOSIS — M542 Cervicalgia: Secondary | ICD-10-CM | POA: Diagnosis not present

## 2015-03-18 DIAGNOSIS — M4802 Spinal stenosis, cervical region: Secondary | ICD-10-CM | POA: Diagnosis not present

## 2015-03-21 ENCOUNTER — Other Ambulatory Visit: Payer: Self-pay | Admitting: Family Medicine

## 2015-03-21 DIAGNOSIS — M4802 Spinal stenosis, cervical region: Secondary | ICD-10-CM

## 2015-04-04 DIAGNOSIS — M542 Cervicalgia: Secondary | ICD-10-CM | POA: Diagnosis not present

## 2015-04-04 DIAGNOSIS — M4722 Other spondylosis with radiculopathy, cervical region: Secondary | ICD-10-CM | POA: Diagnosis not present

## 2015-04-04 DIAGNOSIS — M5030 Other cervical disc degeneration, unspecified cervical region: Secondary | ICD-10-CM | POA: Diagnosis not present

## 2015-04-04 DIAGNOSIS — G5602 Carpal tunnel syndrome, left upper limb: Secondary | ICD-10-CM | POA: Diagnosis not present

## 2015-04-04 DIAGNOSIS — Z6826 Body mass index (BMI) 26.0-26.9, adult: Secondary | ICD-10-CM | POA: Diagnosis not present

## 2015-04-04 DIAGNOSIS — R51 Headache: Secondary | ICD-10-CM | POA: Diagnosis not present

## 2015-04-25 ENCOUNTER — Ambulatory Visit (HOSPITAL_BASED_OUTPATIENT_CLINIC_OR_DEPARTMENT_OTHER)
Admission: RE | Admit: 2015-04-25 | Discharge: 2015-04-25 | Disposition: A | Discharge status: Home / Self Care | Payer: Medicare Other | Source: Ambulatory Visit | Attending: Family Medicine | Admitting: Family Medicine

## 2015-04-25 DIAGNOSIS — Z1231 Encounter for screening mammogram for malignant neoplasm of breast: Secondary | ICD-10-CM | POA: Insufficient documentation

## 2015-04-27 ENCOUNTER — Other Ambulatory Visit: Payer: Self-pay

## 2015-04-27 NOTE — Patient Outreach (Signed)
[facility]) Care Management  04/27/2015  [name] [birth year] [id number]  Unsuccessful attempt to reach patient who responded to a [facility] mailing about our services.  HIPPA appropriate message left requesting call back.  If no response I will attempt another call within 5 working days.  Candie Mile, RN, MSN [place] [phone] Fax [phone]

## 2015-05-03 ENCOUNTER — Other Ambulatory Visit: Payer: Self-pay

## 2015-05-03 NOTE — Patient Outreach (Signed)
[facility]) Care Management  05/03/2015  LOLAMAE BAUMHOVER [birth year] [id number]   Second unsuccessful attempt to reach patient.  HIPPA appropriate message left requesting call back.  If no response RN will make another attempt within 5 working days.  Candie Mile, RN, MSN [place] [phone] Fax [phone]

## 2015-05-09 ENCOUNTER — Other Ambulatory Visit: Payer: Self-pay

## 2015-05-09 NOTE — Patient Outreach (Signed)
[facility] [facility]) Care Management  05/09/2015  Amanda [name] Mar 14, 1940 [id number]  Third unsuccessful attempt to reach patient.  HIPPA appropriate message left requesting call back. Rescheduled for 05-11-15.  Candie Mile, RN, MSN [place] [phone] Fax [phone]

## 2015-05-11 ENCOUNTER — Other Ambulatory Visit: Payer: Self-pay

## 2015-05-11 NOTE — Patient Outreach (Signed)
[facility]) Care Management  05/11/2015  Amanda [name] September 02, 1940 [id number]   Fourth unsuccessful attempt to reach patient.  HIPPA appropriate message left requesting call back.  Patient has been sent a letter with [facility] brochure, magnet, and "Know Before You Go" flyer in the past.  If no response within 10 working days, case will be closed.  Candie Mile, RN, MSN [place] [phone] Fax [phone]

## 2015-05-30 DIAGNOSIS — H40013 Open angle with borderline findings, low risk, bilateral: Secondary | ICD-10-CM | POA: Diagnosis not present

## 2015-05-30 DIAGNOSIS — H35031 Hypertensive retinopathy, right eye: Secondary | ICD-10-CM | POA: Diagnosis not present

## 2015-05-30 DIAGNOSIS — H353122 Nonexudative age-related macular degeneration, left eye, intermediate dry stage: Secondary | ICD-10-CM | POA: Diagnosis not present

## 2015-05-30 DIAGNOSIS — Z961 Presence of intraocular lens: Secondary | ICD-10-CM | POA: Diagnosis not present

## 2015-05-30 DIAGNOSIS — H353112 Nonexudative age-related macular degeneration, right eye, intermediate dry stage: Secondary | ICD-10-CM | POA: Diagnosis not present

## 2015-05-30 DIAGNOSIS — Z8679 Personal history of other diseases of the circulatory system: Secondary | ICD-10-CM | POA: Diagnosis not present

## 2015-05-30 DIAGNOSIS — H35032 Hypertensive retinopathy, left eye: Secondary | ICD-10-CM | POA: Diagnosis not present

## 2015-05-30 LAB — HM DIABETES EYE EXAM

## 2015-06-05 ENCOUNTER — Ambulatory Visit (INDEPENDENT_AMBULATORY_CARE_PROVIDER_SITE_OTHER): Payer: Medicare Other | Admitting: Family Medicine

## 2015-06-05 ENCOUNTER — Encounter: Payer: Self-pay | Admitting: Family Medicine

## 2015-06-05 VITALS — BP 122/74 | HR 64 | Temp 98.2°F | Wt 149.8 lb

## 2015-06-05 DIAGNOSIS — Z0000 Encounter for general adult medical examination without abnormal findings: Secondary | ICD-10-CM

## 2015-06-05 DIAGNOSIS — F329 Major depressive disorder, single episode, unspecified: Secondary | ICD-10-CM

## 2015-06-05 DIAGNOSIS — E039 Hypothyroidism, unspecified: Secondary | ICD-10-CM | POA: Diagnosis not present

## 2015-06-05 DIAGNOSIS — E538 Deficiency of other specified B group vitamins: Secondary | ICD-10-CM

## 2015-06-05 DIAGNOSIS — F32A Depression, unspecified: Secondary | ICD-10-CM

## 2015-06-05 DIAGNOSIS — E559 Vitamin D deficiency, unspecified: Secondary | ICD-10-CM

## 2015-06-05 LAB — POCT URINALYSIS DIPSTICK
BILIRUBIN UA: NEGATIVE
Blood, UA: NEGATIVE
Glucose, UA: NEGATIVE
Ketones, UA: NEGATIVE
LEUKOCYTES UA: NEGATIVE
NITRITE UA: NEGATIVE
PH UA: 6
PROTEIN UA: NEGATIVE
Spec Grav, UA: 1.025
Urobilinogen, UA: 0.2

## 2015-06-05 MED ORDER — BUPROPION HCL ER (XL) 300 MG PO TB24: 300.0000 mg | ORAL_TABLET | Freq: Every day | ORAL | Status: DC

## 2015-06-05 MED ORDER — LEVOTHYROXINE SODIUM 125 MCG PO TABS: 125.0000 ug | ORAL_TABLET | Freq: Every day | ORAL | Status: DC

## 2015-06-05 NOTE — Patient Instructions (Addendum)
Preventive Care for Adults, Female A healthy lifestyle and preventive care can promote health and wellness. Preventive health guidelines for women include the following key practices.  A routine yearly physical is a good way to check with your health care provider about your health and preventive screening. It is a chance to share any concerns and updates on your health and to receive a thorough exam.  Visit your dentist for a routine exam and preventive care every 6 months. Brush your teeth twice a day and floss once a day. Good oral hygiene prevents tooth decay and gum disease.  The frequency of eye exams is based on your age, health, family medical history, use of contact lenses, and other factors. Follow your health care provider's recommendations for frequency of eye exams.  Eat a healthy diet. Foods like vegetables, fruits, whole grains, low-fat dairy products, and lean protein foods contain the nutrients you need without too many calories. Decrease your intake of foods high in solid fats, added sugars, and salt. Eat the right amount of calories for you.Get information about a proper diet from your health care provider, if necessary.  Regular physical exercise is one of the most important things you can do for your health. Most adults should get at least 150 minutes of moderate-intensity exercise (any activity that increases your heart rate and causes you to sweat) each week. In addition, most adults need muscle-strengthening exercises on 2 or more days a week.  Maintain a healthy weight. The body mass index (BMI) is a screening tool to identify possible weight problems. It provides an estimate of body fat based on height and weight. Your health care provider can find your BMI and can help you achieve or maintain a healthy weight.For adults 20 years and older:  A BMI below 18.5 is considered underweight.  A BMI of 18.5 to 24.9 is normal.  A BMI of 25 to 29.9 is considered overweight.  A  BMI of 30 and above is considered obese.  Maintain normal blood lipids and cholesterol levels by exercising and minimizing your intake of saturated fat. Eat a balanced diet with plenty of fruit and vegetables. Blood tests for lipids and cholesterol should begin at age 45 and be repeated every 5 years. If your lipid or cholesterol levels are high, you are over 50, or you are at high risk for heart disease, you may need your cholesterol levels checked more frequently.Ongoing high lipid and cholesterol levels should be treated with medicines if diet and exercise are not working.  If you smoke, find out from your health care provider how to quit. If you do not use tobacco, do not start.  Lung cancer screening is recommended for adults aged 45-80 years who are at high risk for developing lung cancer because of a history of smoking. A yearly low-dose CT scan of the lungs is recommended for people who have at least a 30-pack-year history of smoking and are a current smoker or have quit within the past 15 years. A pack year of smoking is smoking an average of 1 pack of cigarettes a day for 1 year (for example: 1 pack a day for 30 years or 2 packs a day for 15 years). Yearly screening should continue until the smoker has stopped smoking for at least 15 years. Yearly screening should be stopped for people who develop a health problem that would prevent them from having lung cancer treatment.  If you are pregnant, do not drink alcohol. If you are  breastfeeding, be very cautious about drinking alcohol. If you are not pregnant and choose to drink alcohol, do not have more than 1 drink per day. One drink is considered to be 12 ounces (355 mL) of beer, 5 ounces (148 mL) of wine, or 1.5 ounces (44 mL) of liquor.  Avoid use of street drugs. Do not share needles with anyone. Ask for help if you need support or instructions about stopping the use of drugs.  High blood pressure causes heart disease and increases the risk  of stroke. Your blood pressure should be checked at least every 1 to 2 years. Ongoing high blood pressure should be treated with medicines if weight loss and exercise do not work.  If you are 55-79 years old, ask your health care provider if you should take aspirin to prevent strokes.  Diabetes screening is done by taking a blood sample to check your blood glucose level after you have not eaten for a certain period of time (fasting). If you are not overweight and you do not have risk factors for diabetes, you should be screened once every 3 years starting at age 45. If you are overweight or obese and you are 40-70 years of age, you should be screened for diabetes every year as part of your cardiovascular risk assessment.  Breast cancer screening is essential preventive care for women. You should practice "breast self-awareness." This means understanding the normal appearance and feel of your breasts and may include breast self-examination. Any changes detected, no matter how small, should be reported to a health care provider. Women in their 20s and 30s should have a clinical breast exam (CBE) by a health care provider as part of a regular health exam every 1 to 3 years. After age 40, women should have a CBE every year. Starting at age 40, women should consider having a mammogram (breast X-ray test) every year. Women who have a family history of breast cancer should talk to their health care provider about genetic screening. Women at a high risk of breast cancer should talk to their health care providers about having an MRI and a mammogram every year.  Breast cancer gene (BRCA)-related cancer risk assessment is recommended for women who have family members with BRCA-related cancers. BRCA-related cancers include breast, ovarian, tubal, and peritoneal cancers. Having family members with these cancers may be associated with an increased risk for harmful changes (mutations) in the breast cancer genes BRCA1 and  BRCA2. Results of the assessment will determine the need for genetic counseling and BRCA1 and BRCA2 testing.  Your health care provider may recommend that you be screened regularly for cancer of the pelvic organs (ovaries, uterus, and vagina). This screening involves a pelvic examination, including checking for microscopic changes to the surface of your cervix (Pap test). You may be encouraged to have this screening done every 3 years, beginning at age 21.  For women ages 30-65, health care providers may recommend pelvic exams and Pap testing every 3 years, or they may recommend the Pap and pelvic exam, combined with testing for human papilloma virus (HPV), every 5 years. Some types of HPV increase your risk of cervical cancer. Testing for HPV may also be done on women of any age with unclear Pap test results.  Other health care providers may not recommend any screening for nonpregnant women who are considered low risk for pelvic cancer and who do not have symptoms. Ask your health care provider if a screening pelvic exam is right for   you.  If you have had past treatment for cervical cancer or a condition that could lead to cancer, you need Pap tests and screening for cancer for at least 20 years after your treatment. If Pap tests have been discontinued, your risk factors (such as having a new sexual partner) need to be reassessed to determine if screening should resume. Some women have medical problems that increase the chance of getting cervical cancer. In these cases, your health care provider may recommend more frequent screening and Pap tests.  Colorectal cancer can be detected and often prevented. Most routine colorectal cancer screening begins at the age of 50 years and continues through age 75 years. However, your health care provider may recommend screening at an earlier age if you have risk factors for colon cancer. On a yearly basis, your health care provider may provide home test kits to check  for hidden blood in the stool. Use of a small camera at the end of a tube, to directly examine the colon (sigmoidoscopy or colonoscopy), can detect the earliest forms of colorectal cancer. Talk to your health care provider about this at age 50, when routine screening begins. Direct exam of the colon should be repeated every 5-10 years through age 75 years, unless early forms of precancerous polyps or small growths are found.  People who are at an increased risk for hepatitis B should be screened for this virus. You are considered at high risk for hepatitis B if:  You were born in a country where hepatitis B occurs often. Talk with your health care provider about which countries are considered high risk.  Your parents were born in a high-risk country and you have not received a shot to protect against hepatitis B (hepatitis B vaccine).  You have HIV or AIDS.  You use needles to inject street drugs.  You live with, or have sex with, someone who has hepatitis B.  You get hemodialysis treatment.  You take certain medicines for conditions like cancer, organ transplantation, and autoimmune conditions.  Hepatitis C blood testing is recommended for all people born from 1945 through 1965 and any individual with known risks for hepatitis C.  Practice safe sex. Use condoms and avoid high-risk sexual practices to reduce the spread of sexually transmitted infections (STIs). STIs include gonorrhea, chlamydia, syphilis, trichomonas, herpes, HPV, and human immunodeficiency virus (HIV). Herpes, HIV, and HPV are viral illnesses that have no cure. They can result in disability, cancer, and death.  You should be screened for sexually transmitted illnesses (STIs) including gonorrhea and chlamydia if:  You are sexually active and are younger than 24 years.  You are older than 24 years and your health care provider tells you that you are at risk for this type of infection.  Your sexual activity has changed  since you were last screened and you are at an increased risk for chlamydia or gonorrhea. Ask your health care provider if you are at risk.  If you are at risk of being infected with HIV, it is recommended that you take a prescription medicine daily to prevent HIV infection. This is called preexposure prophylaxis (PrEP). You are considered at risk if:  You are sexually active and do not regularly use condoms or know the HIV status of your partner(s).  You take drugs by injection.  You are sexually active with a partner who has HIV.  Talk with your health care provider about whether you are at high risk of being infected with HIV. If   you choose to begin PrEP, you should first be tested for HIV. You should then be tested every 3 months for as long as you are taking PrEP.  Osteoporosis is a disease in which the bones lose minerals and strength with aging. This can result in serious bone fractures or breaks. The risk of osteoporosis can be identified using a bone density scan. Women ages 67 years and over and women at risk for fractures or osteoporosis should discuss screening with their health care providers. Ask your health care provider whether you should take a calcium supplement or vitamin D to reduce the rate of osteoporosis.  Menopause can be associated with physical symptoms and risks. Hormone replacement therapy is available to decrease symptoms and risks. You should talk to your health care provider about whether hormone replacement therapy is right for you.  Use sunscreen. Apply sunscreen liberally and repeatedly throughout the day. You should seek shade when your shadow is shorter than you. Protect yourself by wearing long sleeves, pants, a wide-brimmed hat, and sunglasses year round, whenever you are outdoors.  Once a month, do a whole body skin exam, using a mirror to look at the skin on your back. Tell your health care provider of new moles, moles that have irregular borders, moles that  are larger than a pencil eraser, or moles that have changed in shape or color.  Stay current with required vaccines (immunizations).  Influenza vaccine. All adults should be immunized every year.  Tetanus, diphtheria, and acellular pertussis (Td, Tdap) vaccine. Pregnant women should receive 1 dose of Tdap vaccine during each pregnancy. The dose should be obtained regardless of the length of time since the last dose. Immunization is preferred during the 27th-36th week of gestation. An adult who has not previously received Tdap or who does not know her vaccine status should receive 1 dose of Tdap. This initial dose should be followed by tetanus and diphtheria toxoids (Td) booster doses every 10 years. Adults with an unknown or incomplete history of completing a 3-dose immunization series with Td-containing vaccines should begin or complete a primary immunization series including a Tdap dose. Adults should receive a Td booster every 10 years.  Varicella vaccine. An adult without evidence of immunity to varicella should receive 2 doses or a second dose if she has previously received 1 dose. Pregnant females who do not have evidence of immunity should receive the first dose after pregnancy. This first dose should be obtained before leaving the health care facility. The second dose should be obtained 4-8 weeks after the first dose.  Human papillomavirus (HPV) vaccine. Females aged 13-26 years who have not received the vaccine previously should obtain the 3-dose series. The vaccine is not recommended for use in pregnant females. However, pregnancy testing is not needed before receiving a dose. If a female is found to be pregnant after receiving a dose, no treatment is needed. In that case, the remaining doses should be delayed until after the pregnancy. Immunization is recommended for any person with an immunocompromised condition through the age of 61 years if she did not get any or all doses earlier. During the  3-dose series, the second dose should be obtained 4-8 weeks after the first dose. The third dose should be obtained 24 weeks after the first dose and 16 weeks after the second dose.  Zoster vaccine. One dose is recommended for adults aged 30 years or older unless certain conditions are present.  Measles, mumps, and rubella (MMR) vaccine. Adults born  before 1957 generally are considered immune to measles and mumps. Adults born in 1957 or later should have 1 or more doses of MMR vaccine unless there is a contraindication to the vaccine or there is laboratory evidence of immunity to each of the three diseases. A routine second dose of MMR vaccine should be obtained at least 28 days after the first dose for students attending postsecondary schools, health care workers, or international travelers. People who received inactivated measles vaccine or an unknown type of measles vaccine during 1963-1967 should receive 2 doses of MMR vaccine. People who received inactivated mumps vaccine or an unknown type of mumps vaccine before 1979 and are at high risk for mumps infection should consider immunization with 2 doses of MMR vaccine. For females of childbearing age, rubella immunity should be determined. If there is no evidence of immunity, females who are not pregnant should be vaccinated. If there is no evidence of immunity, females who are pregnant should delay immunization until after pregnancy. Unvaccinated health care workers born before 1957 who lack laboratory evidence of measles, mumps, or rubella immunity or laboratory confirmation of disease should consider measles and mumps immunization with 2 doses of MMR vaccine or rubella immunization with 1 dose of MMR vaccine.  Pneumococcal 13-valent conjugate (PCV13) vaccine. When indicated, a person who is uncertain of his immunization history and has no record of immunization should receive the PCV13 vaccine. All adults 65 years of age and older should receive this  vaccine. An adult aged 19 years or older who has certain medical conditions and has not been previously immunized should receive 1 dose of PCV13 vaccine. This PCV13 should be followed with a dose of pneumococcal polysaccharide (PPSV23) vaccine. Adults who are at high risk for pneumococcal disease should obtain the PPSV23 vaccine at least 8 weeks after the dose of PCV13 vaccine. Adults older than 77 years of age who have normal immune system function should obtain the PPSV23 vaccine dose at least 1 year after the dose of PCV13 vaccine.  Pneumococcal polysaccharide (PPSV23) vaccine. When PCV13 is also indicated, PCV13 should be obtained first. All adults aged 65 years and older should be immunized. An adult younger than age 65 years who has certain medical conditions should be immunized. Any person who resides in a nursing home or long-term care facility should be immunized. An adult smoker should be immunized. People with an immunocompromised condition and certain other conditions should receive both PCV13 and PPSV23 vaccines. People with human immunodeficiency virus (HIV) infection should be immunized as soon as possible after diagnosis. Immunization during chemotherapy or radiation therapy should be avoided. Routine use of PPSV23 vaccine is not recommended for American Indians, [place] Natives, or people younger than 65 years unless there are medical conditions that require PPSV23 vaccine. When indicated, people who have unknown immunization and have no record of immunization should receive PPSV23 vaccine. One-time revaccination 5 years after the first dose of PPSV23 is recommended for people aged 19-64 years who have chronic kidney failure, nephrotic syndrome, asplenia, or immunocompromised conditions. People who received 1-2 doses of PPSV23 before age 65 years should receive another dose of PPSV23 vaccine at age 65 years or later if at least 5 years have passed since the previous dose. Doses of PPSV23 are not  needed for people immunized with PPSV23 at or after age 65 years.  Meningococcal vaccine. Adults with asplenia or persistent complement component deficiencies should receive 2 doses of quadrivalent meningococcal conjugate (MenACWY-D) vaccine. The doses should be obtained   at least 2 months apart. Microbiologists working with certain meningococcal bacteria, [place] recruits, people at risk during an outbreak, and people who travel to or live in countries with a high rate of meningitis should be immunized. A first-year college student up through age 34 years who is living in a residence hall should receive a dose if she did not receive a dose on or after her 16th birthday. Adults who have certain high-risk conditions should receive one or more doses of vaccine.  Hepatitis A vaccine. Adults who wish to be protected from this disease, have certain high-risk conditions, work with hepatitis A-infected animals, work in hepatitis A research labs, or travel to or work in countries with a high rate of hepatitis A should be immunized. Adults who were previously unvaccinated and who anticipate close contact with an international adoptee during the first 60 days after arrival in the [place] from a country with a high rate of hepatitis A should be immunized.  Hepatitis B vaccine. Adults who wish to be protected from this disease, have certain high-risk conditions, may be exposed to blood or other infectious body fluids, are household contacts or sex partners of hepatitis B positive people, are clients or workers in certain care facilities, or travel to or work in countries with a high rate of hepatitis B should be immunized.  Haemophilus influenzae type b (Hib) vaccine. A previously unvaccinated person with asplenia or sickle cell disease or having a scheduled splenectomy should receive 1 dose of Hib vaccine. Regardless of previous immunization, a recipient of a hematopoietic stem cell transplant should receive a  3-dose series 6-12 months after her successful transplant. Hib vaccine is not recommended for adults with HIV infection. Preventive Services / Frequency Ages 35 to 4 years  Blood pressure check.** / Every 3-5 years.  Lipid and cholesterol check.** / Every 5 years beginning at age 60.  Clinical breast exam.** / Every 3 years for women in their 71s and 10s.  BRCA-related cancer risk assessment.** / For women who have family members with a BRCA-related cancer (breast, ovarian, tubal, or peritoneal cancers).  Pap test.** / Every 2 years from ages 76 through 26. Every 3 years starting at age 61 through age 76 or 93 with a history of 3 consecutive normal Pap tests.  HPV screening.** / Every 3 years from ages 37 through ages 60 to 51 with a history of 3 consecutive normal Pap tests.  Hepatitis C blood test.** / For any individual with known risks for hepatitis C.  Skin self-exam. / Monthly.  Influenza vaccine. / Every year.  Tetanus, diphtheria, and acellular pertussis (Tdap, Td) vaccine.** / Consult your health care provider. Pregnant women should receive 1 dose of Tdap vaccine during each pregnancy. 1 dose of Td every 10 years.  Varicella vaccine.** / Consult your health care provider. Pregnant females who do not have evidence of immunity should receive the first dose after pregnancy.  HPV vaccine. / 3 doses over 6 months, if 93 and younger. The vaccine is not recommended for use in pregnant females. However, pregnancy testing is not needed before receiving a dose.  Measles, mumps, rubella (MMR) vaccine.** / You need at least 1 dose of MMR if you were born in 1957 or later. You may also need a 2nd dose. For females of childbearing age, rubella immunity should be determined. If there is no evidence of immunity, females who are not pregnant should be vaccinated. If there is no evidence of immunity, females who are  pregnant should delay immunization until after pregnancy.  Pneumococcal  13-valent conjugate (PCV13) vaccine.** / Consult your health care provider.  Pneumococcal polysaccharide (PPSV23) vaccine.** / 1 to 2 doses if you smoke cigarettes or if you have certain conditions.  Meningococcal vaccine.** / 1 dose if you are age 68 to 8 years and a Market researcher living in a residence hall, or have one of several medical conditions, you need to get vaccinated against meningococcal disease. You may also need additional booster doses.  Hepatitis A vaccine.** / Consult your health care provider.  Hepatitis B vaccine.** / Consult your health care provider.  Haemophilus influenzae type b (Hib) vaccine.** / Consult your health care provider. Ages 7 to 53 years  Blood pressure check.** / Every year.  Lipid and cholesterol check.** / Every 5 years beginning at age 25 years.  Lung cancer screening. / Every year if you are aged 11-80 years and have a 30-pack-year history of smoking and currently smoke or have quit within the past 15 years. Yearly screening is stopped once you have quit smoking for at least 15 years or develop a health problem that would prevent you from having lung cancer treatment.  Clinical breast exam.** / Every year after age 48 years.  BRCA-related cancer risk assessment.** / For women who have family members with a BRCA-related cancer (breast, ovarian, tubal, or peritoneal cancers).  Mammogram.** / Every year beginning at age 41 years and continuing for as long as you are in good health. Consult with your health care provider.  Pap test.** / Every 3 years starting at age 65 years through age 37 or 70 years with a history of 3 consecutive normal Pap tests.  HPV screening.** / Every 3 years from ages 72 years through ages 60 to 40 years with a history of 3 consecutive normal Pap tests.  Fecal occult blood test (FOBT) of stool. / Every year beginning at age 21 years and continuing until age 5 years. You may not need to do this test if you get  a colonoscopy every 10 years.  Flexible sigmoidoscopy or colonoscopy.** / Every 5 years for a flexible sigmoidoscopy or every 10 years for a colonoscopy beginning at age 35 years and continuing until age 48 years.  Hepatitis C blood test.** / For all people born from [birth year] through 1965 and any individual with known risks for hepatitis C.  Skin self-exam. / Monthly.  Influenza vaccine. / Every year.  Tetanus, diphtheria, and acellular pertussis (Tdap/Td) vaccine.** / Consult your health care provider. Pregnant women should receive 1 dose of Tdap vaccine during each pregnancy. 1 dose of Td every 10 years.  Varicella vaccine.** / Consult your health care provider. Pregnant females who do not have evidence of immunity should receive the first dose after pregnancy.  Zoster vaccine.** / 1 dose for adults aged 30 years or older.  Measles, mumps, rubella (MMR) vaccine.** / You need at least 1 dose of MMR if you were born in 1957 or later. You may also need a second dose. For females of childbearing age, rubella immunity should be determined. If there is no evidence of immunity, females who are not pregnant should be vaccinated. If there is no evidence of immunity, females who are pregnant should delay immunization until after pregnancy.  Pneumococcal 13-valent conjugate (PCV13) vaccine.** / Consult your health care provider.  Pneumococcal polysaccharide (PPSV23) vaccine.** / 1 to 2 doses if you smoke cigarettes or if you have certain conditions.  Meningococcal vaccine.** /  Consult your health care provider.  Hepatitis A vaccine.** / Consult your health care provider.  Hepatitis B vaccine.** / Consult your health care provider.  Haemophilus influenzae type b (Hib) vaccine.** / Consult your health care provider. Ages 65 years and over  Blood pressure check.** / Every year.  Lipid and cholesterol check.** / Every 5 years beginning at age 20 years.  Lung cancer screening. / Every year if you  are aged 55-80 years and have a 30-pack-year history of smoking and currently smoke or have quit within the past 15 years. Yearly screening is stopped once you have quit smoking for at least 15 years or develop a health problem that would prevent you from having lung cancer treatment.  Clinical breast exam.** / Every year after age 40 years.  BRCA-related cancer risk assessment.** / For women who have family members with a BRCA-related cancer (breast, ovarian, tubal, or peritoneal cancers).  Mammogram.** / Every year beginning at age 40 years and continuing for as long as you are in good health. Consult with your health care provider.  Pap test.** / Every 3 years starting at age 30 years through age 65 or 70 years with 3 consecutive normal Pap tests. Testing can be stopped between 65 and 70 years with 3 consecutive normal Pap tests and no abnormal Pap or HPV tests in the past 10 years.  HPV screening.** / Every 3 years from ages 30 years through ages 65 or 70 years with a history of 3 consecutive normal Pap tests. Testing can be stopped between 65 and 70 years with 3 consecutive normal Pap tests and no abnormal Pap or HPV tests in the past 10 years.  Fecal occult blood test (FOBT) of stool. / Every year beginning at age 50 years and continuing until age 75 years. You may not need to do this test if you get a colonoscopy every 10 years.  Flexible sigmoidoscopy or colonoscopy.** / Every 5 years for a flexible sigmoidoscopy or every 10 years for a colonoscopy beginning at age 50 years and continuing until age 75 years.  Hepatitis C blood test.** / For all people born from 1945 through 1965 and any individual with known risks for hepatitis C.  Osteoporosis screening.** / A one-time screening for women ages 65 years and over and women at risk for fractures or osteoporosis.  Skin self-exam. / Monthly.  Influenza vaccine. / Every year.  Tetanus, diphtheria, and acellular pertussis (Tdap/Td)  vaccine.** / 1 dose of Td every 10 years.  Varicella vaccine.** / Consult your health care provider.  Zoster vaccine.** / 1 dose for adults aged 60 years or older.  Pneumococcal 13-valent conjugate (PCV13) vaccine.** / Consult your health care provider.  Pneumococcal polysaccharide (PPSV23) vaccine.** / 1 dose for all adults aged 65 years and older.  Meningococcal vaccine.** / Consult your health care provider.  Hepatitis A vaccine.** / Consult your health care provider.  Hepatitis B vaccine.** / Consult your health care provider.  Haemophilus influenzae type b (Hib) vaccine.** / Consult your health care provider. ** Family history and personal history of risk and conditions may change your health care provider's recommendations.   This information is not intended to replace advice given to you by your health care provider. Make sure you discuss any questions you have with your health care provider.   Document Released: 04/16/2001 Document Revised: 03/11/2014 Document Reviewed: 07/16/2010 Elsevier Interactive Patient Education 2016 Elsevier Inc[employer] Food Choices for Gastroesophageal Reflux Disease, Adult When you have gastroesophageal reflux   disease (GERD), the foods you eat and your eating habits are very important. Choosing the right foods can help ease the discomfort of GERD. WHAT GENERAL GUIDELINES DO I NEED TO FOLLOW?  Choose fruits, vegetables, whole grains, low-fat dairy products, and low-fat meat, fish, and poultry.  Limit fats such as oils, salad dressings, butter, nuts, and avocado.  Keep a food diary to identify foods that cause symptoms.  Avoid foods that cause reflux. These may be different for different people.  Eat frequent small meals instead of three large meals each day.  Eat your meals slowly, in a relaxed setting.  Limit fried foods.  Cook foods using methods other than frying.  Avoid drinking alcohol.  Avoid drinking large amounts of liquids with your  meals.  Avoid bending over or lying down until 2-3 hours after eating. WHAT FOODS ARE NOT RECOMMENDED? The following are some foods and drinks that may worsen your symptoms: Vegetables Tomatoes. Tomato juice. Tomato and spaghetti sauce. Chili peppers. Onion and garlic. Horseradish. Fruits Oranges, grapefruit, and lemon (fruit and juice). Meats High-fat meats, fish, and poultry. This includes hot dogs, ribs, ham, sausage, salami, and bacon. Dairy Whole milk and chocolate milk. Sour cream. Cream. Butter. Ice cream. Cream cheese.  Beverages Coffee and tea, with or without caffeine. Carbonated beverages or energy drinks. Condiments Hot sauce. Barbecue sauce.  Sweets/Desserts Chocolate and cocoa. Donuts. Peppermint and spearmint. Fats and Oils High-fat foods, including French fries and potato chips. Other Vinegar. Strong spices, such as black pepper, white pepper, red pepper, cayenne, curry powder, cloves, ginger, and chili powder. The items listed above may not be a complete list of foods and beverages to avoid. Contact your dietitian for more information.   This information is not intended to replace advice given to you by your health care provider. Make sure you discuss any questions you have with your health care provider.   Document Released: 02/18/2005 Document Revised: 03/11/2014 Document Reviewed: 12/23/2012 Elsevier Interactive Patient Education 2016 Elsevier Inc.  

## 2015-06-05 NOTE — Progress Notes (Signed)
++  Subjective:   Amanda [name] is a 75 y.o. female who presents for Medicare Annual (Subsequent) preventive examination.  Review of Systems:   Review of Systems  Constitutional: Negative for activity change, appetite change and fatigue.  HENT: Negative for hearing loss, congestion, tinnitus and ear discharge.   Eyes: Negative for visual disturbance (see optho q1y -- vision corrected to 20/20 with glasses).  Respiratory: Negative for cough, chest tightness and shortness of breath.   Cardiovascular: Negative for chest pain, palpitations and leg swelling.  Gastrointestinal: Negative for abdominal pain, diarrhea, constipation and abdominal distention.  Genitourinary: Negative for urgency, frequency, decreased urine volume and difficulty urinating.  Musculoskeletal: Negative for back pain, arthralgias and gait problem.  Skin: Negative for color change, pallor and rash.  Neurological: Negative for dizziness, light-headedness, numbness and headaches.  Hematological: Negative for adenopathy. Does not bruise/bleed easily.  Psychiatric/Behavioral: Negative for suicidal ideas, confusion, sleep disturbance, self-injury, dysphoric mood, decreased concentration and agitation.  Pt is able to read and write and can do all ADLs No risk for falling No abuse/ violence in home              Objective:     Vitals: BP 122/74 mmHg  Pulse 64  Temp(Src) 98.2 F (36.8 C) (Oral)  Wt 149 lb 12.8 oz (67.949 kg)  SpO2 97%  Body mass index is 27.64 kg/(m^2).  BP 122/74 mmHg  Pulse 64  Temp(Src) 98.2 F (36.8 C) (Oral)  Wt 149 lb 12.8 oz (67.949 kg)  SpO2 97% General appearance: alert, cooperative, appears stated age and no distress Head: Normocephalic, without obvious abnormality, atraumatic Eyes: conjunctivae/corneas clear. PERRL, EOM's intact. Fundi benign. Ears: normal TM's and external ear canals both ears Nose: Nares normal. Septum midline. Mucosa normal. No drainage or sinus  tenderness. Throat: lips, mucosa, and tongue normal; teeth and gums normal Neck: no adenopathy, no carotid bruit, no JVD, supple, symmetrical, trachea midline and thyroid not enlarged, symmetric, no tenderness/mass/nodules Back: symmetric, no curvature. ROM normal. No CVA tenderness. Lungs: clear to auscultation bilaterally Breasts: normal appearance, no masses or tenderness Heart: regular rate and rhythm, S1, S2 normal, no murmur, click, rub or gallop Abdomen: soft, non-tender; bowel sounds normal; no masses,  no organomegaly Pelvic: deferred--gyn Extremities: extremities normal, atraumatic, no cyanosis or edema Pulses: 2+ and symmetric Skin: Skin color, texture, turgor normal. No rashes or lesions Lymph nodes: Cervical, supraclavicular, and axillary nodes normal. Neurologic: Alert and oriented X 3, normal strength and tone. Normal symmetric reflexes. Normal coordination and gait p Tobacco History  Smoking status  . Never Smoker   Smokeless tobacco  . Never Used     Counseling given: Not Answered   Past Medical History  Diagnosis Date  . Hypothyroidism   . Hyperlipidemia   . Constipation, chronic   . PONV (postoperative nausea and vomiting)     needs little anesthesia  . GERD (gastroesophageal reflux disease)     zantac  . History of blood transfusion [place]  . Shortness of breath     on exertion  . SUI (stress urinary incontinence, female)   . Shingles   . Macular degeneration 2013    Both eyes   . Pneumonia   . CHF (congestive heart failure) [facility])    Past Surgical History  Procedure Laterality Date  . Tonsillectomy and adenoidectomy    . Cesarean section  1959  . Wisdom tooth extraction    . Cataract extraction  2009, 2011  BOTH EYES  . Tubal ligation      BY LAPAROSCOPY  . Dilation and curettage of uterus      X2  . Carpal tunnel release  1999  . Colonoscopy       Dr Cristina Gong  . Hysteroscopy w/d&c  01/07/2012    Procedure: DILATATION AND  CURETTAGE /HYSTEROSCOPY;  Surgeon: Terrance Mass, MD;  Location: [facility];  Service: Gynecology;  Laterality: N/A;  intrauterine foley catheter for tamponode    Family History  Problem Relation Age of Onset  . Ovarian cancer Mother   . Breast cancer Mother 61  . Hypertension Father   . Prostate cancer Father   . Kidney failure Father   . Diabetes Father   . Hyperlipidemia Brother   . COPD Paternal Grandfather   . Stroke Maternal Grandfather    History  Sexual Activity  . Sexual Activity: Yes  . Birth Control/ Protection: Post-menopausal    Outpatient Encounter Prescriptions as of 06/05/2015  Medication Sig  . Ascorbic Acid (VITAMIN C CR) 1000 MG TBCR Take 1 each by mouth daily. EMERGEN-C PACKET  . buPROPion (WELLBUTRIN XL) 300 MG 24 hr tablet Take 1 tablet (300 mg total) by mouth daily.  . cholecalciferol (VITAMIN D) 1000 units tablet Take 1,000 Units by mouth daily.  [name] levothyroxine (SYNTHROID, LEVOTHROID) 125 MCG tablet Take 1 tablet (125 mcg total) by mouth daily before breakfast.  . Multiple Vitamins-Minerals (ICAPS) TABS Take 2 tablets by mouth daily. AREDS 1+2  . Probiotic Product (PROBIOTIC DAILY PO) Take 1 capsule by mouth daily.   . vitamin B-12 (CYANOCOBALAMIN) 250 MCG tablet Take 250 mcg by mouth daily.  . [DISCONTINUED] buPROPion (WELLBUTRIN XL) 300 MG 24 hr tablet Take 1 tablet (300 mg total) by mouth daily.  . [DISCONTINUED] levothyroxine (SYNTHROID, LEVOTHROID) 125 MCG tablet Take 1 tablet (125 mcg total) by mouth daily before breakfast.  . [DISCONTINUED] azithromycin (ZITHROMAX) 250 MG tablet Take 2 tablets by mouth on day 1, followed by 1 tablet by mouth daily for 4 days.  . [DISCONTINUED] benzonatate (TESSALON) 100 MG capsule Take 1 capsule (100 mg total) by mouth 3 (three) times daily as needed.  . [DISCONTINUED] diclofenac (VOLTAREN) 75 MG EC tablet Take 1 tablet (75 mg total) by mouth 2 (two) times daily. (Patient not taking: Reported on 03/08/2015)  .  [DISCONTINUED] fluticasone (FLONASE) 50 MCG/ACT nasal spray SHAKE LIQUID AND USE 2 SPRAYS IN EACH NOSTRIL DAILY   No facility-administered encounter medications on file as of 06/05/2015.    Activities of Daily Living In your present state of health, do you have any difficulty performing the following activities: 11/17/2014  Hearing? N  Vision? N  Difficulty concentrating or making decisions? N  Walking or climbing stairs? N  Dressing or bathing? N  Doing errands, shopping? N    Patient Care Team: [name], DO as PCP - General (Family Medicine) Jari Pigg, MD as Consulting Physician (Dermatology) Monna Fam, MD as Consulting Physician (Ophthalmology) Terrance Mass, MD as Consulting Physician (Gynecology)    Assessment:    CPE Exercise Activities and Dietary recommendations    Goals    None     Fall Risk Fall Risk  12/05/2014 08/26/2013  Falls in the past year? No No   Depression Screen PHQ 2/9 Scores 12/05/2014 08/26/2013  PHQ - 2 Score 0 0     Cognitive Testing No flowsheet data found.  Immunization History  Administered Date(s) Administered  . Influenza Whole 03/04/2001, 01/12/2009,  04/04/2010  . Influenza, High Dose Seasonal PF 01/20/2014, 12/05/2014  . Influenza,inj,Quad PF,36+ Mos 01/21/2013  . Pneumococcal Conjugate-13 01/20/2014  . Pneumococcal Polysaccharide-23 07/10/2012  . Td 09/01/1997   Screening Tests Health Maintenance  Topic Date Due  . ZOSTAVAX  12/26/1998  . TETANUS/TDAP  09/02/2007  . INFLUENZA VACCINE  10/03/2015  . DEXA SCAN  Completed  . PNA vac Low Risk Adult  Completed      Plan:    see AVS During the course of the visit the patient was educated and counseled about the following appropriate screening and preventive services:   Vaccines to include Pneumoccal, Influenza, Hepatitis B, Td, Zostavax, HCV  Electrocardiogram  Cardiovascular Disease  Colorectal cancer screening  Bone density screening  Diabetes  screening  Glaucoma screening  Mammography/PAP  Nutrition counseling   Patient Instructions (the written plan) was given to the patient.  1. Vitamin D deficiency   - cholecalciferol (VITAMIN D) 1000 units tablet; Take 1,000 Units by mouth daily. - Vitamin D 1,25 dihydroxy  2. Vitamin B12 deficiency   - vitamin B-12 (CYANOCOBALAMIN) 250 MCG tablet; Take 250 mcg by mouth daily. - Vitamin B12  3. Hypothyroidism, unspecified hypothyroidism type   - TSH - POCT urinalysis dipstick - Lipid panel - CBC with Differential/Platelet - Comprehensive metabolic panel - Vitamin 123456 - Vitamin D 1,25 dihydroxy - levothyroxine (SYNTHROID, LEVOTHROID) 125 MCG tablet; Take 1 tablet (125 mcg total) by mouth daily before breakfast.  Dispense: 90 tablet; Refill: 3  4. Depression   - buPROPion (WELLBUTRIN XL) 300 MG 24 hr tablet; Take 1 tablet (300 mg total) by mouth daily.  Dispense: 90 tablet; Refill: 3  5. Routine history and physical examination of adult    [name], DO  06/05/2015

## 2015-06-06 LAB — CBC WITH DIFFERENTIAL/PLATELET
BASOS ABS: 0 10*3/uL (ref 0.0–0.1)
Basophils Relative: 0.5 % (ref 0.0–3.0)
Eosinophils Absolute: 0.3 10*3/uL (ref 0.0–0.7)
Eosinophils Relative: 4.8 % (ref 0.0–5.0)
HCT: 35.9 % — ABNORMAL LOW (ref 36.0–46.0)
HEMOGLOBIN: 12 g/dL (ref 12.0–15.0)
LYMPHS ABS: 1.2 10*3/uL (ref 0.7–4.0)
Lymphocytes Relative: 21.5 % (ref 12.0–46.0)
MCHC: 33.4 g/dL (ref 30.0–36.0)
MCV: 80.8 fl (ref 78.0–100.0)
MONO ABS: 0.6 10*3/uL (ref 0.1–1.0)
Monocytes Relative: 10.6 % (ref 3.0–12.0)
NEUTROS PCT: 62.6 % (ref 43.0–77.0)
Neutro Abs: 3.4 10*3/uL (ref 1.4–7.7)
Platelets: 292 10*3/uL (ref 150.0–400.0)
RBC: 4.44 Mil/uL (ref 3.87–5.11)
RDW: 14.7 % (ref 11.5–15.5)
WBC: 5.4 10*3/uL (ref 4.0–10.5)

## 2015-06-06 LAB — COMPREHENSIVE METABOLIC PANEL
ALK PHOS: 74 U/L (ref 39–117)
ALT: 15 U/L (ref 0–35)
AST: 17 U/L (ref 0–37)
Albumin: 4.3 g/dL (ref 3.5–5.2)
BILIRUBIN TOTAL: 0.3 mg/dL (ref 0.2–1.2)
BUN: 24 mg/dL — AB (ref 6–23)
CO2: 31 mEq/L (ref 19–32)
CREATININE: 1.1 mg/dL (ref 0.40–1.20)
Calcium: 10 mg/dL (ref 8.4–10.5)
Chloride: 103 mEq/L (ref 96–112)
GFR: 51.27 mL/min — ABNORMAL LOW (ref >60.00)
GLUCOSE: 88 mg/dL (ref 70–99)
Potassium: 4.6 mEq/L (ref 3.5–5.1)
SODIUM: 140 meq/L (ref 135–145)
TOTAL PROTEIN: 6.9 g/dL (ref 6.0–8.3)

## 2015-06-06 LAB — VITAMIN B12: Vitamin B-12: 512 pg/mL (ref 211–911)

## 2015-06-06 LAB — LIPID PANEL
CHOL/HDL RATIO: 3
Cholesterol: 217 mg/dL — ABNORMAL HIGH (ref 0–200)
HDL: 64.2 mg/dL (ref >39.00)
LDL Cholesterol: 130 mg/dL — ABNORMAL HIGH (ref 0–99)
NONHDL: 152.52
Triglycerides: 114 mg/dL (ref 0.0–149.0)
VLDL: 22.8 mg/dL (ref 0.0–40.0)

## 2015-06-06 LAB — TSH: TSH: 6.82 u[IU]/mL — ABNORMAL HIGH (ref 0.35–4.50)

## 2015-06-09 ENCOUNTER — Other Ambulatory Visit: Payer: Self-pay

## 2015-06-09 DIAGNOSIS — E039 Hypothyroidism, unspecified: Secondary | ICD-10-CM

## 2015-06-09 LAB — VITAMIN D 1,25 DIHYDROXY
VITAMIN D 1, 25 (OH) TOTAL: 41 pg/mL (ref 18–72)
VITAMIN D3 1, 25 (OH): 41 pg/mL
Vitamin D2 1, 25 (OH)2: <8 pg/mL

## 2015-06-09 MED ORDER — SYNTHROID 137 MCG PO TABS: 137.0000 ug | ORAL_TABLET | Freq: Every day | ORAL | Status: DC

## 2015-06-14 ENCOUNTER — Encounter: Payer: Self-pay | Admitting: Family Medicine

## 2015-06-21 ENCOUNTER — Encounter: Payer: Self-pay | Admitting: Gynecology

## 2015-06-21 ENCOUNTER — Ambulatory Visit (INDEPENDENT_AMBULATORY_CARE_PROVIDER_SITE_OTHER): Payer: Medicare Other | Admitting: Gynecology

## 2015-06-21 VITALS — BP 134/82 | Ht 61.5 in | Wt 154.0 lb

## 2015-06-21 DIAGNOSIS — Z803 Family history of malignant neoplasm of breast: Secondary | ICD-10-CM

## 2015-06-21 DIAGNOSIS — N359 Urethral stricture, unspecified: Secondary | ICD-10-CM

## 2015-06-21 DIAGNOSIS — M8580 Other specified disorders of bone density and structure, unspecified site: Secondary | ICD-10-CM | POA: Diagnosis not present

## 2015-06-21 DIAGNOSIS — Z01419 Encounter for gynecological examination (general) (routine) without abnormal findings: Secondary | ICD-10-CM

## 2015-06-21 DIAGNOSIS — Z8041 Family history of malignant neoplasm of ovary: Secondary | ICD-10-CM

## 2015-06-21 DIAGNOSIS — IMO0002 Reserved for concepts with insufficient information to code with codable children: Secondary | ICD-10-CM | POA: Insufficient documentation

## 2015-06-21 NOTE — Patient Instructions (Signed)
Bone Densitometry Bone densitometry is an imaging test that uses a special X-ray to measure the amount of calcium and other minerals in your bones (bone density). This test is also known as a bone mineral density test or dual-energy X-ray absorptiometry (DXA). The test can measure bone density at your hip and your spine. It is similar to having a regular X-ray. You may have this test to:  Diagnose a condition that causes weak or thin bones (osteoporosis).  Predict your risk of a broken bone (fracture).  Determine how well osteoporosis treatment is working. LET YOUR HEALTH CARE PROVIDER KNOW ABOUT:  Any allergies you have.  All medicines you are taking, including vitamins, herbs, eye drops, creams, and over-the-counter medicines.  Previous problems you or members of your family have had with the use of anesthetics.  Any blood disorders you have.  Previous surgeries you have had.  Medical conditions you have.  Possibility of pregnancy.  Any other medical test you had within the previous 14 days that used contrast material. RISKS AND COMPLICATIONS Generally, this is a safe procedure. However, problems can occur and may include the following:  This test exposes you to a very small amount of radiation.  The risks of radiation exposure may be greater to unborn children. BEFORE THE PROCEDURE  Do not take any calcium supplements for 24 hours before having the test. You can otherwise eat and drink what you usually do.  Take off all metal jewelry, eyeglasses, dental appliances, and any other metal objects. PROCEDURE  You may lie on an exam table. There will be an X-ray generator below you and an imaging device above you.  Other devices, such as boxes or braces, may be used to position your body properly for the scan.  You will need to lie still while the machine slowly scans your body.  The images will show up on a computer monitor. AFTER THE PROCEDURE You may need more testing  at a later time.   This information is not intended to replace advice given to you by your health care provider. Make sure you discuss any questions you have with your health care provider.   Document Released: 03/12/2004 Document Revised: 03/11/2014 Document Reviewed: 07/29/2013 Elsevier Interactive Patient Education 2016 Elsevier Inc.  

## 2015-06-21 NOTE — Addendum Note (Signed)
Addended by: Joaquin Music on: 06/21/2015 03:56 PM   Modules accepted: Orders

## 2015-06-21 NOTE — Progress Notes (Signed)
Amanda [name] 1941/01/17 [id number]   History:    75 y.o.  for annual gyn exam with no complaints today..Patient with history of osteopenia and her records indicated that she came off the Fosamax in 2010 and has been on a drug free holiday. Her last bone density study was done here in our office last year and her lowest T score -1.5 at the left femoral neck with significant decrease in bone mineralization of the femoral neck when compared with 2 years prior. Her PCP has been doing her blood work. Patient reports normal colonoscopy in 2014.  Patient's mother had breast cancer at the age of 66 and later at age 81 developed ovarian cancer. Patient previously advised and counseled to proceed with BRCA1 and BRCA2 gene mutation screening as well as consultation with geneticist the patient has declined. Patient with no past history of any abnormal Pap smears. Pelvic ultrasound last year was normal. PCP has been doing her blood work. Patient reports normal colonoscopy 2014. Patient with no past history of any abnormal Pap smear.  Patient has been complaining that she does not empty completely she has to apply pressure when she urinates to complete voiding. No dysuria. No back pain.   Past medical history,surgical history, family history and social history were all reviewed and documented in the EPIC chart.  Gynecologic History No LMP recorded. Patient is postmenopausal. Contraception: post menopausal status Last Pap: 2013. Results were: normal Last mammogram: 2017. Results were: normal  Obstetric History OB History  Gravida Para Term Preterm AB SAB TAB Ectopic Multiple Living  _0 # Outcome Date GA Lbr Len/2nd Weight Sex Delivery Anes PTL Lv  3 64     M CS-Unspec   FD  2 Preterm     M Vag-Spont  Y Y  1 Preterm     F Vag-Spont  N Y       ROS: A ROS was performed and pertinent positives and negatives are included in the history.  GENERAL: No fevers or chills. HEENT: No  change in vision, no earache, sore throat or sinus congestion. NECK: No pain or stiffness. CARDIOVASCULAR: No chest pain or pressure. No palpitations. PULMONARY: No shortness of breath, cough or wheeze. GASTROINTESTINAL: No abdominal pain, nausea, vomiting or diarrhea, melena or bright red blood per rectum. GENITOURINARY: No urinary frequency, urgency, hesitancy or dysuria. MUSCULOSKELETAL: No joint or muscle pain, no back pain, no recent trauma. DERMATOLOGIC: No rash, no itching, no lesions. ENDOCRINE: No polyuria, polydipsia, no heat or cold intolerance. No recent change in weight. HEMATOLOGICAL: No anemia or easy bruising or bleeding. NEUROLOGIC: No headache, seizures, numbness, tingling or weakness. PSYCHIATRIC: No depression, no loss of interest in normal activity or change in sleep pattern.     Exam: chaperone present  BP 134/82 mmHg  Ht 5' 1.5" (1.562 m)  Wt 154 lb (69.854 kg)  BMI 28.63 kg/m2  Body mass index is 28.63 kg/(m^2).  General appearance : Well developed well nourished female. No acute distress HEENT: Eyes: no retinal hemorrhage or exudates,  Neck supple, trachea midline, no carotid bruits, no thyroidmegaly Lungs: Clear to auscultation, no rhonchi or wheezes, or rib retractions  Heart: Regular rate and rhythm, no murmurs or gallops Breast:Examined in sitting and supine position were symmetrical in appearance, no palpable masses or tenderness,  no skin retraction, no nipple inversion, no nipple discharge, no skin discoloration, no axillary or supraclavicular lymphadenopathy Abdomen: no  palpable masses or tenderness, no rebound or guarding Extremities: no edema or skin discoloration or tenderness  Pelvic:  Bartholin, Urethra, Skene Glands: Within normal limits             Vagina: No gross lesions or discharge, atrophic changes  Cervix: No gross lesions or discharge  Uterus  anteverted, normal size, shape and consistency, non-tender and mobile  Adnexa  Without masses or  tenderness  Anus and perineum  normal   Rectovaginal  normal sphincter tone without palpated masses or tenderness             Hemoccult PCP provides     Assessment/Plan:  75 y.o. female for annual exam will schedule for screening ultrasound sometime next week because of patient's mother with history of breast and ovarian cancer. Patient has not gone to genetic counselor for testing for BRCA one BRCA2. Patient with history of osteopenia and normal Frax analysis to schedule bone density study in June or July of this month. Pap smear not indicated according to the new guidelines. Patient will be referred to the urologist for possible urethral dilatation because of her symptoms highly suspicious for urethral stricture. We'll check her urinalysis today.   Terrance Mass MD, 3:16 PM 06/21/2015

## 2015-06-22 ENCOUNTER — Telehealth: Payer: Self-pay | Admitting: *Deleted

## 2015-06-22 NOTE — Telephone Encounter (Signed)
-----   Message from Terrance Mass, MD sent at 06/21/2015  3:14 PM EDT ----- Please schedule an appointment for this patient with [facility] with urethral stricture and incomplete voiding

## 2015-06-22 NOTE — Telephone Encounter (Signed)
Office notes faxed they will fax me back with time and date to relay to patient.

## 2015-06-30 NOTE — Telephone Encounter (Signed)
Appt. 07/06/15 @ 9:00am with Dr.MacDiarmid pt aware.

## 2015-07-04 DIAGNOSIS — D485 Neoplasm of uncertain behavior of skin: Secondary | ICD-10-CM | POA: Diagnosis not present

## 2015-07-04 DIAGNOSIS — D225 Melanocytic nevi of trunk: Secondary | ICD-10-CM | POA: Diagnosis not present

## 2015-07-06 DIAGNOSIS — Z0000 Encounter for general adult medical examination without abnormal findings: Secondary | ICD-10-CM | POA: Diagnosis not present

## 2015-07-06 DIAGNOSIS — R3912 Poor urinary stream: Secondary | ICD-10-CM | POA: Diagnosis not present

## 2015-07-06 DIAGNOSIS — N3946 Mixed incontinence: Secondary | ICD-10-CM | POA: Diagnosis not present

## 2015-07-10 ENCOUNTER — Other Ambulatory Visit: Payer: Self-pay | Admitting: Gynecology

## 2015-07-10 DIAGNOSIS — M810 Age-related osteoporosis without current pathological fracture: Secondary | ICD-10-CM

## 2015-07-19 ENCOUNTER — Ambulatory Visit (INDEPENDENT_AMBULATORY_CARE_PROVIDER_SITE_OTHER): Payer: Medicare Other | Admitting: Gynecology

## 2015-07-19 ENCOUNTER — Encounter: Payer: Self-pay | Admitting: Gynecology

## 2015-07-19 ENCOUNTER — Ambulatory Visit (INDEPENDENT_AMBULATORY_CARE_PROVIDER_SITE_OTHER): Payer: Medicare Other

## 2015-07-19 VITALS — BP 136/78

## 2015-07-19 DIAGNOSIS — M8580 Other specified disorders of bone density and structure, unspecified site: Secondary | ICD-10-CM

## 2015-07-19 DIAGNOSIS — Z803 Family history of malignant neoplasm of breast: Secondary | ICD-10-CM | POA: Diagnosis not present

## 2015-07-19 DIAGNOSIS — Z780 Asymptomatic menopausal state: Secondary | ICD-10-CM

## 2015-07-19 DIAGNOSIS — Z8041 Family history of malignant neoplasm of ovary: Secondary | ICD-10-CM

## 2015-07-19 NOTE — Patient Instructions (Signed)
Bone Densitometry Bone densitometry is an imaging test that uses a special X-ray to measure the amount of calcium and other minerals in your bones (bone density). This test is also known as a bone mineral density test or dual-energy X-ray absorptiometry (DXA). The test can measure bone density at your hip and your spine. It is similar to having a regular X-ray. You may have this test to:  Diagnose a condition that causes weak or thin bones (osteoporosis).  Predict your risk of a broken bone (fracture).  Determine how well osteoporosis treatment is working. LET YOUR HEALTH CARE PROVIDER KNOW ABOUT:  Any allergies you have.  All medicines you are taking, including vitamins, herbs, eye drops, creams, and over-the-counter medicines.  Previous problems you or members of your family have had with the use of anesthetics.  Any blood disorders you have.  Previous surgeries you have had.  Medical conditions you have.  Possibility of pregnancy.  Any other medical test you had within the previous 14 days that used contrast material. RISKS AND COMPLICATIONS Generally, this is a safe procedure. However, problems can occur and may include the following:  This test exposes you to a very small amount of radiation.  The risks of radiation exposure may be greater to unborn children. BEFORE THE PROCEDURE  Do not take any calcium supplements for 24 hours before having the test. You can otherwise eat and drink what you usually do.  Take off all metal jewelry, eyeglasses, dental appliances, and any other metal objects. PROCEDURE  You may lie on an exam table. There will be an X-ray generator below you and an imaging device above you.  Other devices, such as boxes or braces, may be used to position your body properly for the scan.  You will need to lie still while the machine slowly scans your body.  The images will show up on a computer monitor. AFTER THE PROCEDURE You may need more testing  at a later time.   This information is not intended to replace advice given to you by your health care provider. Make sure you discuss any questions you have with your health care provider.   Document Released: 03/12/2004 Document Revised: 03/11/2014 Document Reviewed: 07/29/2013 Elsevier Interactive Patient Education 2016 Elsevier Inc.  

## 2015-07-19 NOTE — Progress Notes (Signed)
patient is a 75 year old who was seen in the office on April 19 of this year for her annual gynecological examination and she is here today for her annual screening ultrasound due to the fact that she has a very strong family history whereby her mother had breast cancer at the age of 27 and later at the age of 69 developed ovarian cancer.Patient previously advised and counseled to proceed with BRCA1 and BRCA2 gene mutation screening as well as consultation with geneticist the patient has declined. Patient with no past history of any abnormal Pap smears. Pelvic ultrasound last year was normal.   We  also today reviewed her last bone density study from 2015 that had  Demonstrated that the lowest T score was -1.5 at the left femoral neck with statistically significant decrease in bone mineralization of the left femoral neck when compared with the study done 2 years prior. No Frax analysis had been done. With this T score she has lost 15-20% of her bone mass. She is currently taking her calcium and vitamin D.   Today's ultrasound demonstrated the following:  uterus measured 2.5 x 4.8 x 5.1 cm with endometrial stripe of 1.6 mm. Myometrium had scattered calcification. Right ovary and left ovary were not seen no apparent adnexal masses and no fluid in the cul-de-sac.   Assessment/plan: Postmenopausal patient with strong family history of breast ovarian cancer screening ultrasound demonstrated no evidence of any abnormalities in her ovaries   In fact they were not seen.  She was reassured. We also went over her osteopenia and discussed importance of her calcium and vitamin D and weightbearing exercises for osteoporosis prevention she will need her bone density study next year. Review of her record indicated that on April 3 her calcium level was normal as was her vitamin D level.  Patient to schedule her bone density study in the next few weeks.  . Greater than 50% of time was spent in counseling coordinate  care of this patient with strong family history of breast and ovarian cancer and osteopenia.   Time of consultation 15 minutes

## 2015-07-20 ENCOUNTER — Encounter: Payer: Self-pay | Admitting: Family Medicine

## 2015-08-07 ENCOUNTER — Other Ambulatory Visit: Payer: Self-pay | Admitting: Family Medicine

## 2015-08-10 ENCOUNTER — Encounter: Payer: Self-pay | Admitting: Gynecology

## 2015-08-10 ENCOUNTER — Ambulatory Visit (INDEPENDENT_AMBULATORY_CARE_PROVIDER_SITE_OTHER): Payer: Medicare Other

## 2015-08-10 DIAGNOSIS — M810 Age-related osteoporosis without current pathological fracture: Secondary | ICD-10-CM

## 2015-09-08 ENCOUNTER — Other Ambulatory Visit: Payer: Self-pay | Admitting: Family Medicine

## 2015-10-07 ENCOUNTER — Other Ambulatory Visit: Payer: Self-pay | Admitting: Family Medicine

## 2015-10-07 DIAGNOSIS — E079 Disorder of thyroid, unspecified: Secondary | ICD-10-CM

## 2015-10-11 ENCOUNTER — Other Ambulatory Visit (INDEPENDENT_AMBULATORY_CARE_PROVIDER_SITE_OTHER): Payer: Medicare Other

## 2015-10-11 DIAGNOSIS — E079 Disorder of thyroid, unspecified: Secondary | ICD-10-CM

## 2015-10-11 LAB — TSH: TSH: 3.68 u[IU]/mL (ref 0.35–4.50)

## 2015-10-18 ENCOUNTER — Telehealth: Payer: Self-pay | Admitting: Family Medicine

## 2015-10-18 NOTE — Telephone Encounter (Signed)
Relation to [id number] Call back number:[phone] Pharmacy: [facility] [place], [address] AT [address] [phone] (Phone) [phone] (Fax)     Reason for call:  Patient inquiring about lab results and requesting refill for SYNTHROID 137 MCG tablet

## 2015-10-19 MED ORDER — SYNTHROID 137 MCG PO TABS: ORAL_TABLET | ORAL | 5 refills | Status: DC

## 2015-10-25 DIAGNOSIS — K2970 Gastritis, unspecified, without bleeding: Secondary | ICD-10-CM | POA: Diagnosis not present

## 2015-10-25 DIAGNOSIS — R112 Nausea with vomiting, unspecified: Secondary | ICD-10-CM | POA: Diagnosis not present

## 2015-10-25 NOTE — Telephone Encounter (Signed)
Rx faxed 10/19/15 to [facility] on [address].     KP

## 2015-10-25 NOTE — Telephone Encounter (Signed)
Patient is inquiring about refill on Synthroid. States that she had her labs done 8/9 but still does not have arefill. Plse adv.

## 2015-10-26 ENCOUNTER — Emergency Department (HOSPITAL_COMMUNITY): Payer: Medicare Other

## 2015-10-26 ENCOUNTER — Encounter (HOSPITAL_COMMUNITY): Payer: Self-pay

## 2015-10-26 ENCOUNTER — Emergency Department (HOSPITAL_COMMUNITY)
Admission: EM | Admit: 2015-10-26 | Discharge: 2015-10-26 | Disposition: A | Discharge status: Home / Self Care | Payer: Medicare Other | Attending: Emergency Medicine | Admitting: Emergency Medicine

## 2015-10-26 DIAGNOSIS — Z79899 Other long term (current) drug therapy: Secondary | ICD-10-CM | POA: Diagnosis not present

## 2015-10-26 DIAGNOSIS — N132 Hydronephrosis with renal and ureteral calculous obstruction: Secondary | ICD-10-CM | POA: Diagnosis not present

## 2015-10-26 DIAGNOSIS — E039 Hypothyroidism, unspecified: Secondary | ICD-10-CM | POA: Diagnosis not present

## 2015-10-26 DIAGNOSIS — I509 Heart failure, unspecified: Secondary | ICD-10-CM | POA: Diagnosis not present

## 2015-10-26 DIAGNOSIS — Z85828 Personal history of other malignant neoplasm of skin: Secondary | ICD-10-CM | POA: Diagnosis not present

## 2015-10-26 DIAGNOSIS — R109 Unspecified abdominal pain: Secondary | ICD-10-CM | POA: Diagnosis present

## 2015-10-26 DIAGNOSIS — N200 Calculus of kidney: Secondary | ICD-10-CM | POA: Diagnosis not present

## 2015-10-26 LAB — URINALYSIS, ROUTINE W REFLEX MICROSCOPIC
BILIRUBIN URINE: NEGATIVE
GLUCOSE, UA: NEGATIVE mg/dL
KETONES UR: NEGATIVE mg/dL
Nitrite: NEGATIVE
PROTEIN: NEGATIVE mg/dL
Specific Gravity, Urine: 1.012 (ref 1.005–1.030)
pH: 8 (ref 5.0–8.0)

## 2015-10-26 LAB — CBC
HCT: 35.1 % — ABNORMAL LOW (ref 36.0–46.0)
Hemoglobin: 11.7 g/dL — ABNORMAL LOW (ref 12.0–15.0)
MCH: 26.4 pg (ref 26.0–34.0)
MCHC: 33.3 g/dL (ref 30.0–36.0)
MCV: 79.2 fL (ref 78.0–100.0)
PLATELETS: 256 10*3/uL (ref 150–400)
RBC: 4.43 MIL/uL (ref 3.87–5.11)
RDW: 14.2 % (ref 11.5–15.5)
WBC: 6.5 10*3/uL (ref 4.0–10.5)

## 2015-10-26 LAB — URINE MICROSCOPIC-ADD ON: SQUAMOUS EPITHELIAL / LPF: NONE SEEN

## 2015-10-26 LAB — LIPASE, BLOOD: LIPASE: 19 U/L (ref 11–51)

## 2015-10-26 LAB — COMPREHENSIVE METABOLIC PANEL
ALT: 16 U/L (ref 14–54)
ANION GAP: 6 (ref 5–15)
AST: 23 U/L (ref 15–41)
Albumin: 4.3 g/dL (ref 3.5–5.0)
Alkaline Phosphatase: 67 U/L (ref 38–126)
BUN: 20 mg/dL (ref 6–20)
CHLORIDE: 106 mmol/L (ref 101–111)
CO2: 27 mmol/L (ref 22–32)
CREATININE: 1.15 mg/dL — AB (ref 0.44–1.00)
Calcium: 9.5 mg/dL (ref 8.9–10.3)
GFR, EST AFRICAN AMERICAN: 52 mL/min — AB (ref >60)
GFR, EST NON AFRICAN AMERICAN: 45 mL/min — AB (ref >60)
Glucose, Bld: 118 mg/dL — ABNORMAL HIGH (ref 65–99)
POTASSIUM: 4.4 mmol/L (ref 3.5–5.1)
Sodium: 139 mmol/L (ref 135–145)
Total Bilirubin: 0.3 mg/dL (ref 0.3–1.2)
Total Protein: 6.9 g/dL (ref 6.5–8.1)

## 2015-10-26 MED ORDER — OXYCODONE-ACETAMINOPHEN 5-325 MG PO TABS
2.0000 | ORAL_TABLET | Freq: Once | ORAL | Status: AC
  Administered 2015-10-26: 2 via ORAL
  Filled 2015-10-26: qty 2

## 2015-10-26 MED ORDER — ONDANSETRON HCL 4 MG/2ML IJ SOLN
4.0000 mg | Freq: Once | INTRAMUSCULAR | Status: AC
  Administered 2015-10-26: 4 mg via INTRAVENOUS
  Filled 2015-10-26: qty 2

## 2015-10-26 MED ORDER — OXYCODONE-ACETAMINOPHEN 5-325 MG PO TABS: 1.0000 | ORAL_TABLET | Freq: Four times a day (QID) | ORAL | 0 refills | Status: DC | PRN

## 2015-10-26 MED ORDER — TAMSULOSIN HCL 0.4 MG PO CAPS: 0.4000 mg | ORAL_CAPSULE | Freq: Every day | ORAL | 0 refills | Status: DC

## 2015-10-26 MED ORDER — KETOROLAC TROMETHAMINE 30 MG/ML IJ SOLN
30.0000 mg | Freq: Once | INTRAMUSCULAR | Status: AC
  Administered 2015-10-26: 30 mg via INTRAVENOUS
  Filled 2015-10-26: qty 1

## 2015-10-26 MED ORDER — ONDANSETRON HCL 4 MG PO TABS: 4.0000 mg | ORAL_TABLET | Freq: Four times a day (QID) | ORAL | 0 refills | Status: DC | PRN

## 2015-10-26 MED ORDER — FENTANYL CITRATE (PF) 100 MCG/2ML IJ SOLN
25.0000 ug | Freq: Once | INTRAMUSCULAR | Status: AC
Start: 2015-10-26 — End: 2015-10-26
  Administered 2015-10-26: 25 ug via INTRAVENOUS
  Filled 2015-10-26: qty 2

## 2015-10-26 NOTE — ED Provider Notes (Signed)
[facility] DEPT Provider Note   CSN: [id number] Arrival date & time: 10/26/15  0005 By signing my name below, I, Dyke Brackett, attest that this documentation has been prepared under the direction and in the presence of non-physician practitioner, Antonietta Breach, PA-C Electronically Signed: Dyke Brackett, Scribe. 10/26/2015. 1:24 AM.   History   Chief Complaint Chief Complaint  Patient presents with  . Flank Pain  . Abdominal Pain    HPI Amanda [name] is a 75 y.o. female brought in by ambulance who presents to the Emergency Department complaining of sudden onset, severe, worsening, right flank pain with radiation to RLQ which began 3 hours ago. She describes the pain as "pressure". She notes associated pallor, nausea and vomiting.  Pt states she took a shower PTA with some quickly disappearing relief of pain. Pt was given zofran and fentanyl by EMS with some relief of symptoms. Per pt, pain is worsened with palpitation. Pt has no hx of kidney stones. Pt denies any abdominal surgical hx. She denies hematuria, dysuria, CP, or SOB.  The history is provided by the patient. No language interpreter was used.    Past Medical History:  Diagnosis Date  . CHF (congestive heart failure) ([place])   . Constipation, chronic   . GERD (gastroesophageal reflux disease)    zantac  . History of blood transfusion [place]  . Hyperlipidemia   . Hypothyroidism   . Macular degeneration 2013   Both eyes   . Osteopenia   . Pneumonia   . PONV (postoperative nausea and vomiting)    needs little anesthesia  . Shingles   . Shortness of breath    on exertion  . SUI (stress urinary incontinence, female)     Patient Active Problem List   Diagnosis Date Noted  . 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[place]) 12/05/2014  . Acute respiratory failure with hypoxia ([place]) 11/20/2014  . HLD (hyperlipidemia) 11/17/2014  . GERD (gastroesophageal reflux disease) 11/17/2014  .  Cervical lymphadenitis 12/06/2013  . Family history of ovarian cancer 05/31/2013  . CAP (community acquired pneumonia) 07/02/2012  . Chest pain 07/02/2012  . Abnormal CT scan, head 07/02/2012  . Postmenopausal 03/10/2012  . Family history of breast cancer 12/12/2011  . IBS (irritable bowel syndrome) 12/12/2011  . Abdominal bloating 12/12/2011  . Chronic constipation 12/12/2011  . CARPAL TUNNEL SYNDROME, LEFT 04/21/2009  . GAIT DISTURBANCE 04/21/2009  . HYPERLIPIDEMIA 01/12/2009  . CERVICALGIA 09/12/2008  . Hypothyroidism 08/06/2006  . OSTEOPENIA 08/06/2006  . URINARY INCONTINENCE 08/06/2006  . SKIN CANCER, HX OF 08/06/2006    Past Surgical History:  Procedure Laterality Date  . CARPAL TUNNEL RELEASE  1999  . CATARACT EXTRACTION  2009, 2011   BOTH EYES  . [place]  . COLONOSCOPY      Dr Cristina Gong  . DILATION AND CURETTAGE OF UTERUS     X2  . HYSTEROSCOPY W/D&C  01/07/2012   Procedure: DILATATION AND CURETTAGE /HYSTEROSCOPY;  Surgeon: Terrance Mass, MD;  Location: [facility];  Service: Gynecology;  Laterality: N/A;  intrauterine foley catheter for tamponode   . TONSILLECTOMY AND ADENOIDECTOMY    . TUBAL LIGATION     BY LAPAROSCOPY  . WISDOM TOOTH EXTRACTION      OB History    Gravida Para Term Preterm AB Living   3 3   2   2    SAB TAB Ectopic Multiple Live Births  2       Home Medications    Prior to Admission medications   Medication Sig Start Date End Date Taking? Authorizing Provider  acetaminophen (TYLENOL) 500 MG tablet Take 1,000 mg by mouth every 6 (six) hours as needed for mild pain.   Yes Historical Provider, MD  Ascorbic Acid (VITAMIN C CR) 1000 MG TBCR Take 1 each by mouth daily. EMERGEN-C PACKET   Yes Historical Provider, MD  buPROPion (WELLBUTRIN XL) 300 MG 24 hr tablet Take 1 tablet (300 mg total) by mouth daily. 06/05/15  Yes Yvonne R Lowne Chase, DO  cholecalciferol (VITAMIN D) 1000 units tablet Take 1,000 Units by mouth daily.    Yes Historical Provider, MD  Multiple Vitamins-Minerals (ICAPS) TABS Take 2 tablets by mouth daily. AREDS 1+2   Yes Historical Provider, MD  SYNTHROID 137 MCG tablet TAKE 1 TABLET BY MOUTH EVERY DAY BEFORE BREAKFAST. 10/19/15  Yes Yvonne R Lowne Chase, DO  vitamin B-12 (CYANOCOBALAMIN) 250 MCG tablet Take 250 mcg by mouth daily.   Yes Historical Provider, MD  ondansetron (ZOFRAN) 4 MG tablet Take 1 tablet (4 mg total) by mouth every 6 (six) hours as needed for nausea or vomiting. 10/26/15   Antonietta Breach, PA-C  oxyCODONE-acetaminophen (PERCOCET/ROXICET) 5-325 MG tablet Take 1-2 tablets by mouth every 6 (six) hours as needed for severe pain. 10/26/15   Antonietta Breach, PA-C  tamsulosin (FLOMAX) 0.4 MG CAPS capsule Take 1 capsule (0.4 mg total) by mouth daily. 10/26/15   Antonietta Breach, PA-C    Family History Family History  Problem Relation Age of Onset  . Ovarian cancer Mother   . Breast cancer Mother 41  . Hypertension Father   . Prostate cancer Father   . Kidney failure Father   . Diabetes Father   . Hyperlipidemia Brother   . COPD Paternal Grandfather   . Stroke Maternal Grandfather     Social History Social History  Substance Use Topics  . Smoking status: Never Smoker  . Smokeless tobacco: Never Used  . Alcohol use 0.0 oz/week     Comment: Red wine occasionally     Allergies   Phenergan [promethazine hcl]; Levofloxacin; and Pravastatin sodium   Review of Systems Review of Systems  Respiratory: Negative for shortness of breath.   Cardiovascular: Negative for chest pain.  Gastrointestinal: Positive for abdominal pain, nausea and vomiting.  Genitourinary: Positive for flank pain. Negative for dysuria and hematuria.  Skin: Positive for pallor.  All other systems reviewed and are negative.   Physical Exam Updated Vital Signs BP 171/79 (BP Location: Left Arm)   Pulse (!) 59   Temp 98.7 F (37.1 C) (Oral)   Resp 15   Ht 5' 1.5" (1.562 m)   Wt 65.3 kg   SpO2 95%   BMI 26.77  kg/m   Physical Exam  Constitutional: She is oriented to person, place, and time. She appears well-developed and well-nourished. No distress.  Patient appears pale and uncomfortable  HENT:  Head: Normocephalic and atraumatic.  Eyes: Conjunctivae and EOM are normal. No scleral icterus.  Neck: Normal range of motion.  Cardiovascular: Normal rate, regular rhythm and intact distal pulses.   Pulmonary/Chest: Effort normal. No respiratory distress. She has no wheezes. She has no rales.  Respirations even and unlabored. Lungs clear.  Abdominal: Soft. She exhibits no distension and no mass. There is tenderness. There is no guarding.  Mild tenderness in the epigastric abdomen with tenderness also focally in the right mid and lower abdomen.  No masses. No peritoneal signs.  Musculoskeletal: Normal range of motion.  Neurological: She is alert and oriented to person, place, and time.  GCS 15. Speech is oriented. Patient moving all extremities.  Skin: Skin is warm and dry. No rash noted. She is not diaphoretic. No erythema.  Psychiatric: She has a normal mood and affect. Her behavior is normal.  Nursing note and vitals reviewed.    ED Treatments / Results  DIAGNOSTIC STUDIES:  Oxygen Saturation is 98% on RA, normal by my interpretation.    COORDINATION OF CARE:  1:11 AM Will order Zofran, Toradol, and fentanyl. Discussed treatment plan with pt at bedside and pt agreed to plan.  Labs (all labs ordered are listed, but only abnormal results are displayed) Labs Reviewed  CBC - Abnormal; Notable for the following:       Result Value   Hemoglobin 11.7 (*)    HCT 35.1 (*)    All other components within normal limits  URINALYSIS, ROUTINE W REFLEX MICROSCOPIC (NOT AT [facility]) - Abnormal; Notable for the following:    APPearance CLOUDY (*)    Hgb urine dipstick LARGE (*)    Leukocytes, UA TRACE (*)    All other components within normal limits  COMPREHENSIVE METABOLIC PANEL - Abnormal; Notable for  the following:    Glucose, Bld 118 (*)    Creatinine, Ser 1.15 (*)    GFR calc non Af Amer 45 (*)    GFR calc Af Amer 52 (*)    All other components within normal limits  URINE MICROSCOPIC-ADD ON - Abnormal; Notable for the following:    Bacteria, UA RARE (*)    All other components within normal limits  LIPASE, BLOOD    EKG  EKG Interpretation None       Radiology Ct Renal Stone Study  Result Date: 10/26/2015 CLINICAL DATA:  Right flank pain.  Nausea and vomiting. EXAM: CT ABDOMEN AND PELVIS WITHOUT CONTRAST TECHNIQUE: Multidetector CT imaging of the abdomen and pelvis was performed following the standard protocol without IV contrast. COMPARISON:  No prior abdominal imaging.  Chest CT 11/23/2014 FINDINGS: Lower chest: Right middle lobe opacity similar to air CT. There reticulonodular opacities in the right and left lower lobe not definitively seen previously. Previous pleural effusions have resolved. Liver: No evidence of focal lesion allowing for lack of contrast. Hepatobiliary: Gallbladder physiologically distended, no calcified stone. No biliary dilatation. Pancreas: No ductal dilatation or inflammation. Spleen: Scattered calcified granulomas. Adrenal glands: No nodule. Kidneys: Moderate to severe right hydronephrosis and proximal hydroureter. Obstructing stone measuring 3 mm in the proximal ureter at the level of L3-L4. Mild moderate perinephric edema. The distal ureter is decompressed. No additional nonobstructing stones in either kidney. No left hydronephrosis. Stomach/Bowel: Stomach physiologically distended. There are no dilated or thickened small bowel loops. Moderate diffuse stool burden without colonic wall thickening. The appendix is not confidently identified, no pericecal inflammation. Vascular/Lymphatic: No retroperitoneal adenopathy. Abdominal aorta is normal in caliber. Mild aortic atherosclerosis without aneurysm. Reproductive: Uterus is normal for age.  No adnexal mass.  Bladder: Decompressed and not well evaluated. Other: No free air, free fluid, or intra-abdominal fluid collection. Musculoskeletal: There are no acute or suspicious osseous abnormalities. Remote fractures of the right pubic rami. Degenerative change in the lumbar spine with facet arthropathy. Degenerative disc disease at L1-L2 with peripherally calcified disc osteophyte complex. IMPRESSION: 1. Obstructing 3 mm stone in the proximal right ureter with moderate severe right hydronephrosis. No additional nonobstructing stones in either kidney.  2. Reticulonodular opacities at the lung bases which may be infectious, however can be seen in the setting of aspiration. Pathcy right middle lobe opacity appears chronic. 3. Moderate diffuse stool burden. Electronically Signed   By: Jeb Levering M.D.   On: 10/26/2015 03:03    Procedures Procedures (including critical care time)  Medications Ordered in ED Medications  oxyCODONE-acetaminophen (PERCOCET/ROXICET) 5-325 MG per tablet 2 tablet (not administered)  ondansetron (ZOFRAN) injection 4 mg (4 mg Intravenous Given 10/26/15 0126)  ketorolac (TORADOL) 30 MG/ML injection 30 mg (30 mg Intravenous Given 10/26/15 0126)  fentaNYL (SUBLIMAZE) injection 25 mcg (25 mcg Intravenous Given 10/26/15 0127)     Initial Impression / Assessment and Plan / ED Course  I have reviewed the triage vital signs and the nursing notes.  Pertinent labs & imaging results that were available during my care of the patient were reviewed by me and considered in my medical decision making (see chart for details).  Clinical Course    75 year old female presents to the emergency department for sudden onset of right sided abdominal pain and right flank pain. Symptoms associated with nausea and vomiting. Patient with microscopic hematuria. CT abdomen pelvis confirms obstructing 3 mm right ureteral stone. Mildly elevated creatinine likely secondary to hydronephrosis.  Pain has completely  resolved with additional fentanyl and Toradol. Patient has no complaints of nausea or vomiting. CT results of been reviewed with the patient including findings concerning for possible pneumonia. Patient, however, has had no respiratory symptoms. She denies recent fever and she has no leukocytosis or hypoxia today. Low suspicion for pneumonia at this time. The patient has been advised to follow-up with her primary care doctor regarding these findings. Will also refer to urology. Return precautions discussed and provided. Patient discharged in satisfactory condition with no unaddressed concerns.   Final Clinical Impressions(s) / ED Diagnoses   Final diagnoses:  Kidney stone    I personally performed the services described in this documentation, which was scribed in my presence. The recorded information has been reviewed and is accurate.    New Prescriptions New Prescriptions   ONDANSETRON (ZOFRAN) 4 MG TABLET    Take 1 tablet (4 mg total) by mouth every 6 (six) hours as needed for nausea or vomiting.   OXYCODONE-ACETAMINOPHEN (PERCOCET/ROXICET) 5-325 MG TABLET    Take 1-2 tablets by mouth every 6 (six) hours as needed for severe pain.   TAMSULOSIN (FLOMAX) 0.4 MG CAPS CAPSULE    Take 1 capsule (0.4 mg total) by mouth daily.     Antonietta Breach, PA-C 10/26/15 [id number]    Merryl Hacker, MD 10/26/15 [phone]

## 2015-10-26 NOTE — ED Notes (Signed)
Pt ambulatory and independent at discharge.  Verbalized understanding of discharge instructions 

## 2015-10-26 NOTE — ED Notes (Signed)
PA at bedside.

## 2015-10-26 NOTE — ED Triage Notes (Signed)
Patient arrives by EMS with complaints right flank pain with radiation to RLQ area and groin area.  Nausea and vomiting-no diarrhea.  Symptoms started at 2230.  EMS gave Zofran 4 mg IV and Fentanyl 50 mcg for pain-pain 10/10 to 5/10 and has decreased to a pressure sensation.

## 2015-10-26 NOTE — Discharge Instructions (Signed)
Your CT scan shows that you have a 3 mm kidney stone on the right side. This is causing your pain today. It is causing some urine to back up in your kidney which is the cause of your back pain. This fluid buildup will resolve when your stone passes. Take Flomax to try and promote stone movement. You may take Zofran as prescribed for nausea and Percocet as needed for pain. Follow-up with urology for recheck of symptoms.  Your CT scan also showed findings which may sometimes represent pneumonia. You have had no respiratory symptoms, fever, or evidence of infection on your blood work today. Your oxygen saturation is also reassuring. For these reasons, you have not been started on an antibiotic. We recommend that you see your primary care doctor for follow-up regarding these findings. Return to the emergency department for any new or concerning symptoms.

## 2015-10-26 NOTE — ED Notes (Signed)
Bed: [id number] Expected date:  Expected time:  Means of arrival:  Comments: EMS 75 yo female abdominal pain

## 2015-10-26 NOTE — ED Notes (Signed)
Pt ambulatory to restroom

## 2015-10-27 ENCOUNTER — Ambulatory Visit (HOSPITAL_BASED_OUTPATIENT_CLINIC_OR_DEPARTMENT_OTHER)
Admission: RE | Admit: 2015-10-27 | Discharge: 2015-10-27 | Disposition: A | Discharge status: Home / Self Care | Payer: Medicare Other | Source: Ambulatory Visit | Attending: Family Medicine | Admitting: Family Medicine

## 2015-10-27 ENCOUNTER — Encounter: Payer: Self-pay | Admitting: Family Medicine

## 2015-10-27 ENCOUNTER — Other Ambulatory Visit: Payer: Self-pay | Admitting: Family Medicine

## 2015-10-27 ENCOUNTER — Ambulatory Visit (INDEPENDENT_AMBULATORY_CARE_PROVIDER_SITE_OTHER): Payer: Medicare Other | Admitting: Family Medicine

## 2015-10-27 VITALS — BP 184/73 | HR 57 | Temp 98.2°F | Ht 61.5 in | Wt 150.0 lb

## 2015-10-27 DIAGNOSIS — R918 Other nonspecific abnormal finding of lung field: Secondary | ICD-10-CM | POA: Diagnosis not present

## 2015-10-27 DIAGNOSIS — R935 Abnormal findings on diagnostic imaging of other abdominal regions, including retroperitoneum: Secondary | ICD-10-CM | POA: Insufficient documentation

## 2015-10-27 DIAGNOSIS — R7989 Other specified abnormal findings of blood chemistry: Secondary | ICD-10-CM | POA: Insufficient documentation

## 2015-10-27 DIAGNOSIS — J189 Pneumonia, unspecified organism: Secondary | ICD-10-CM

## 2015-10-27 DIAGNOSIS — R748 Abnormal levels of other serum enzymes: Secondary | ICD-10-CM

## 2015-10-27 LAB — BASIC METABOLIC PANEL
BUN: 22 mg/dL (ref 7–25)
CALCIUM: 9.9 mg/dL (ref 8.6–10.4)
CHLORIDE: 102 mmol/L (ref 98–110)
CO2: 28 mmol/L (ref 20–31)
CREATININE: 1.36 mg/dL — AB (ref 0.60–0.93)
Glucose, Bld: 85 mg/dL (ref 65–99)
Potassium: 4.8 mmol/L (ref 3.5–5.3)
SODIUM: 140 mmol/L (ref 135–146)

## 2015-10-27 MED ORDER — AZITHROMYCIN 250 MG PO TABS: ORAL_TABLET | ORAL | 0 refills | Status: DC

## 2015-10-27 NOTE — Patient Instructions (Signed)
Kidney Stones °Kidney stones (urolithiasis) are deposits that form inside your kidneys. The intense pain is caused by the stone moving through the urinary tract. When the stone moves, the ureter goes into spasm around the stone. The stone is usually passed in the urine.  °CAUSES  °· A disorder that makes certain neck glands produce too much parathyroid hormone (primary hyperparathyroidism). °· A buildup of uric acid crystals, similar to gout in your joints. °· Narrowing (stricture) of the ureter. °· A kidney obstruction present at birth (congenital obstruction). °· Previous surgery on the kidney or ureters. °· Numerous kidney infections. °SYMPTOMS  °· Feeling sick to your stomach (nauseous). °· Throwing up (vomiting). °· Blood in the urine (hematuria). °· Pain that usually spreads (radiates) to the groin. °· Frequency or urgency of urination. °DIAGNOSIS  °· Taking a history and physical exam. °· Blood or urine tests. °· CT scan. °· Occasionally, an examination of the inside of the urinary bladder (cystoscopy) is performed. °TREATMENT  °· Observation. °· Increasing your fluid intake. °· Extracorporeal shock wave lithotripsy--This is a noninvasive procedure that uses shock waves to break up kidney stones. °· Surgery may be needed if you have severe pain or persistent obstruction. There are various surgical procedures. Most of the procedures are performed with the use of small instruments. Only small incisions are needed to accommodate these instruments, so recovery time is minimized. °The size, location, and chemical composition are all important variables that will determine the proper choice of action for you. Talk to your health care provider to better understand your situation so that you will minimize the risk of injury to yourself and your kidney.  °HOME CARE INSTRUCTIONS  °· Drink enough water and fluids to keep your urine clear or pale yellow. This will help you to pass the stone or stone fragments. °· Strain  all urine through the provided strainer. Keep all particulate matter and stones for your health care provider to see. The stone causing the pain may be as small as a grain of salt. It is very important to use the strainer each and every time you pass your urine. The collection of your stone will allow your health care provider to analyze it and verify that a stone has actually passed. The stone analysis will often identify what you can do to reduce the incidence of recurrences. °· Only take over-the-counter or prescription medicines for pain, discomfort, or fever as directed by your health care provider. °· Keep all follow-up visits as told by your health care provider. This is important. °· Get follow-up X-rays if required. The absence of pain does not always mean that the stone has passed. It may have only stopped moving. If the urine remains completely obstructed, it can cause loss of kidney function or even complete destruction of the kidney. It is your responsibility to make sure X-rays and follow-ups are completed. Ultrasounds of the kidney can show blockages and the status of the kidney. Ultrasounds are not associated with any radiation and can be performed easily in a matter of minutes. °· Make changes to your daily diet as told by your health care provider. You may be told to: °¨ Limit the amount of salt that you eat. °¨ Eat 5 or more servings of fruits and vegetables each day. °¨ Limit the amount of meat, poultry, fish, and eggs that you eat. °· Collect a 24-hour urine sample as told by your health care provider. You may need to collect another urine sample every 6-12   months. °SEEK MEDICAL CARE IF: °· You experience pain that is progressive and unresponsive to any pain medicine you have been prescribed. °SEEK IMMEDIATE MEDICAL CARE IF:  °· Pain cannot be controlled with the prescribed medicine. °· You have a fever or shaking chills. °· The severity or intensity of pain increases over 18 hours and is not  relieved by pain medicine. °· You develop a new onset of abdominal pain. °· You feel faint or pass out. °· You are unable to urinate. °  °This information is not intended to replace advice given to you by your health care provider. Make sure you discuss any questions you have with your health care provider. °  °Document Released: 02/18/2005 Document Revised: 11/09/2014 Document Reviewed: 07/22/2012 °Elsevier Interactive Patient Education ©2016 [employer] ° °

## 2015-10-27 NOTE — Progress Notes (Signed)
Pre visit review using our clinic review tool, if applicable. No additional management support is needed unless otherwise documented below in the visit note. 

## 2015-10-27 NOTE — Progress Notes (Signed)
Patient ID: Amanda [name], female    DOB: 09-Jan-1941  Age: 75 y.o. MRN: [id number]    Subjective:  Subjective  HPI TISHEENA BRAMEL presents for f/u from er for kidney stone.  She has had no pain since leaving the er.  She had abnormal CT --? Pneumonia and elevated cr.  Pt with no new complaints and no pain.    Review of Systems  Constitutional: Negative for appetite change, diaphoresis, fatigue and unexpected weight change.  Eyes: Negative for pain, redness and visual disturbance.  Respiratory: Negative for cough, chest tightness, shortness of breath and wheezing.   Cardiovascular: Negative for chest pain, palpitations and leg swelling.  Endocrine: Negative for cold intolerance, heat intolerance, polydipsia, polyphagia and polyuria.  Genitourinary: Negative for difficulty urinating, dysuria and frequency.  Neurological: Negative for dizziness, light-headedness, numbness and headaches.    History Past Medical History:  Diagnosis Date  . CHF (congestive heart failure) ([place])   . Constipation, chronic   . GERD (gastroesophageal reflux disease)    zantac  . History of blood transfusion [place]  . Hyperlipidemia   . Hypothyroidism   . Macular degeneration 2013   Both eyes   . Osteopenia   . Pneumonia   . PONV (postoperative nausea and vomiting)    needs little anesthesia  . Shingles   . Shortness of breath    on exertion  . SUI (stress urinary incontinence, female)     She has a past surgical history that includes Tonsillectomy and adenoidectomy; Cesarean section [id number]); Wisdom tooth extraction; Cataract extraction (2009, 2011); Tubal ligation; Dilation and curettage of uterus; Carpal tunnel release (1999); Colonoscopy; and Hysteroscopy w/D&C (01/07/2012).   Her family history includes Breast cancer (age of onset: 36) in her mother; COPD in her paternal grandfather; Diabetes in her father; Hyperlipidemia in her brother; Hypertension in her father; Kidney failure in her  father; Ovarian cancer in her mother; Prostate cancer in her father; Stroke in her maternal grandfather.She reports that she has never smoked. She has never used smokeless tobacco. She reports that she drinks alcohol. She reports that she does not use drugs.  Current Outpatient Prescriptions on File Prior to Visit  Medication Sig Dispense Refill  . acetaminophen (TYLENOL) 500 MG tablet Take 1,000 mg by mouth every 6 (six) hours as needed for mild pain.    . Ascorbic Acid (VITAMIN C CR) 1000 MG TBCR Take 1 each by mouth daily. EMERGEN-C PACKET    . buPROPion (WELLBUTRIN XL) 300 MG 24 hr tablet Take 1 tablet (300 mg total) by mouth daily. 90 tablet 3  . cholecalciferol (VITAMIN D) 1000 units tablet Take 1,000 Units by mouth daily.    . Multiple Vitamins-Minerals (ICAPS) TABS Take 2 tablets by mouth daily. AREDS 1+2    . SYNTHROID 137 MCG tablet TAKE 1 TABLET BY MOUTH EVERY DAY BEFORE BREAKFAST. 30 tablet 5  . tamsulosin (FLOMAX) 0.4 MG CAPS capsule Take 1 capsule (0.4 mg total) by mouth daily. 7 capsule 0  . vitamin B-12 (CYANOCOBALAMIN) 250 MCG tablet Take 250 mcg by mouth daily.     No current facility-administered medications on file prior to visit.      Objective:  Objective  Physical Exam  Constitutional: She is oriented to person, place, and time. She appears well-developed and well-nourished.  HENT:  Head: Normocephalic and atraumatic.  Eyes: Conjunctivae and EOM are normal.  Neck: Normal range of motion. Neck supple. No JVD present. Carotid bruit is not  present. No thyromegaly present.  Cardiovascular: Normal rate, regular rhythm and normal heart sounds.   No murmur heard. Pulmonary/Chest: Effort normal and breath sounds normal. No respiratory distress. She has no wheezes. She has no rales. She exhibits no tenderness.  Abdominal: Soft. Bowel sounds are normal. She exhibits no distension and no mass. There is no tenderness. There is no rebound and no guarding.  Musculoskeletal: She  exhibits no edema.  Neurological: She is alert and oriented to person, place, and time.  Psychiatric: She has a normal mood and affect. Her behavior is normal. Judgment and thought content normal.  Nursing note and vitals reviewed.  BP (!) 184/73 (BP Location: Left Arm)   Pulse (!) 57   Temp 98.2 F (36.8 C) (Oral)   Ht 5' 1.5" (1.562 m)   Wt 150 lb (68 kg)   SpO2 98%   BMI 27.88 kg/m  Wt Readings from Last 3 Encounters:  10/27/15 150 lb (68 kg)  10/26/15 144 lb (65.3 kg)  06/21/15 154 lb (69.9 kg)     Lab Results  Component Value Date   WBC 6.5 10/26/2015   HGB 11.7 (L) 10/26/2015   HCT 35.1 (L) 10/26/2015   PLT 256 10/26/2015   GLUCOSE 118 (H) 10/26/2015   CHOL 217 (H) 06/05/2015   TRIG 114.0 06/05/2015   HDL 64.20 06/05/2015   LDLDIRECT 142.8 11/22/2010   LDLCALC 130 (H) 06/05/2015   ALT 16 10/26/2015   AST 23 10/26/2015   NA 139 10/26/2015   K 4.4 10/26/2015   CL 106 10/26/2015   CREATININE 1.15 (H) 10/26/2015   BUN 20 10/26/2015   CO2 27 10/26/2015   TSH 3.68 10/11/2015   INR 1.32 11/18/2014   HGBA1C 6.1 (H) 11/18/2014    Dg Chest 2 View  Result Date: 10/27/2015 CLINICAL DATA:  Recent pneumonia EXAM: CHEST  2 VIEW COMPARISON:  CT abdomen and pelvis including lung bases October 26, 2015; chest radiograph December 23, 2014 FINDINGS: There is airspace consolidation in the lingula. There is more subtle patchy infiltrate in the right middle lobe. Lungs elsewhere clear. Heart size and pulmonary vascularity are normal. No adenopathy. No bone lesions. IMPRESSION: Air space consolidation in the lingula anteriorly. More subtle patchy infiltrate in the right middle lobe. Lungs elsewhere clear. Stable cardiac silhouette. Followup PA and lateral chest radiographs recommended in 3-4 weeks following trial of antibiotic therapy to ensure resolution and exclude underlying malignancy. Electronically Signed   By: Lowella Grip III M.D.   On: 10/27/2015 15:53     Assessment &  Plan:  Plan  I have discontinued Ms. Blevins's ondansetron and oxyCODONE-acetaminophen. I am also having her maintain her ICAPS, Vitamin C CR, cholecalciferol, vitamin B-12, buPROPion, SYNTHROID, acetaminophen, and tamsulosin.  No orders of the defined types were placed in this encounter.   Problem List Items Addressed This Visit      Unprioritized   Abnormal CT of the abdomen - Primary    ? Pneumonia Recheck cxr       Relevant Orders   DG Chest 2 View (Completed)   Basic Metabolic Panel (BMET)   Elevated serum creatinine    Probably from dehydration Pt has been drinking more water Recheck bmp      Relevant Orders   Basic Metabolic Panel (BMET)    Other Visit Diagnoses   None.     Follow-up: Return for annual exam, fasting.  [name], DO

## 2015-10-27 NOTE — Assessment & Plan Note (Signed)
Probably from dehydration Pt has been drinking more water Recheck bmp

## 2015-10-27 NOTE — Assessment & Plan Note (Signed)
?   Pneumonia Recheck cxr

## 2015-10-30 ENCOUNTER — Encounter: Payer: Self-pay | Admitting: Family Medicine

## 2015-11-09 ENCOUNTER — Other Ambulatory Visit: Payer: Self-pay | Admitting: Family Medicine

## 2015-11-09 ENCOUNTER — Encounter: Payer: Self-pay | Admitting: Family Medicine

## 2015-11-09 DIAGNOSIS — Z8701 Personal history of pneumonia (recurrent): Secondary | ICD-10-CM

## 2015-11-09 DIAGNOSIS — R7989 Other specified abnormal findings of blood chemistry: Secondary | ICD-10-CM

## 2015-11-12 ENCOUNTER — Other Ambulatory Visit: Payer: Self-pay | Admitting: Family Medicine

## 2015-11-12 DIAGNOSIS — F32A Depression, unspecified: Secondary | ICD-10-CM

## 2015-11-12 DIAGNOSIS — F329 Major depressive disorder, single episode, unspecified: Secondary | ICD-10-CM

## 2015-11-16 ENCOUNTER — Ambulatory Visit: Payer: Medicare Other | Admitting: Family Medicine

## 2015-11-20 ENCOUNTER — Other Ambulatory Visit (INDEPENDENT_AMBULATORY_CARE_PROVIDER_SITE_OTHER): Payer: Medicare Other

## 2015-11-20 ENCOUNTER — Ambulatory Visit (HOSPITAL_BASED_OUTPATIENT_CLINIC_OR_DEPARTMENT_OTHER)
Admission: RE | Admit: 2015-11-20 | Discharge: 2015-11-20 | Disposition: A | Discharge status: Home / Self Care | Payer: Medicare Other | Source: Ambulatory Visit | Attending: Family Medicine | Admitting: Family Medicine

## 2015-11-20 ENCOUNTER — Telehealth: Payer: Self-pay | Admitting: Family Medicine

## 2015-11-20 ENCOUNTER — Encounter (HOSPITAL_BASED_OUTPATIENT_CLINIC_OR_DEPARTMENT_OTHER): Payer: Self-pay

## 2015-11-20 ENCOUNTER — Other Ambulatory Visit: Payer: Self-pay | Admitting: Family Medicine

## 2015-11-20 DIAGNOSIS — R748 Abnormal levels of other serum enzymes: Secondary | ICD-10-CM | POA: Diagnosis not present

## 2015-11-20 DIAGNOSIS — I77819 Aortic ectasia, unspecified site: Secondary | ICD-10-CM | POA: Diagnosis not present

## 2015-11-20 DIAGNOSIS — R918 Other nonspecific abnormal finding of lung field: Secondary | ICD-10-CM | POA: Diagnosis not present

## 2015-11-20 DIAGNOSIS — R9389 Abnormal findings on diagnostic imaging of other specified body structures: Secondary | ICD-10-CM

## 2015-11-20 DIAGNOSIS — Z8701 Personal history of pneumonia (recurrent): Secondary | ICD-10-CM | POA: Insufficient documentation

## 2015-11-20 DIAGNOSIS — R911 Solitary pulmonary nodule: Secondary | ICD-10-CM | POA: Insufficient documentation

## 2015-11-20 DIAGNOSIS — R7989 Other specified abnormal findings of blood chemistry: Secondary | ICD-10-CM

## 2015-11-20 DIAGNOSIS — R938 Abnormal findings on diagnostic imaging of other specified body structures: Secondary | ICD-10-CM | POA: Diagnosis present

## 2015-11-20 DIAGNOSIS — R05 Cough: Secondary | ICD-10-CM | POA: Diagnosis not present

## 2015-11-20 LAB — COMPREHENSIVE METABOLIC PANEL
ALBUMIN: 4 g/dL (ref 3.5–5.2)
ALK PHOS: 66 U/L (ref 39–117)
ALT: 13 U/L (ref 0–35)
AST: 15 U/L (ref 0–37)
BUN: 21 mg/dL (ref 6–23)
CALCIUM: 9.2 mg/dL (ref 8.4–10.5)
CHLORIDE: 104 meq/L (ref 96–112)
CO2: 32 mEq/L (ref 19–32)
CREATININE: 1.13 mg/dL (ref 0.40–1.20)
GFR: 49.64 mL/min — ABNORMAL LOW (ref >60.00)
Glucose, Bld: 77 mg/dL (ref 70–99)
Potassium: 4.7 mEq/L (ref 3.5–5.1)
Sodium: 142 mEq/L (ref 135–145)
TOTAL PROTEIN: 6.3 g/dL (ref 6.0–8.3)
Total Bilirubin: 0.3 mg/dL (ref 0.2–1.2)

## 2015-11-20 MED ORDER — IOPAMIDOL (ISOVUE-300) INJECTION 61%
100.0000 mL | Freq: Once | INTRAVENOUS | Status: AC | PRN
  Administered 2015-11-20: 80 mL via INTRAVENOUS

## 2015-11-20 NOTE — Telephone Encounter (Signed)
See CXR report.    KP

## 2015-11-20 NOTE — Telephone Encounter (Signed)
Pt states she received a call to have CT done after having Xray this morning. Pt would like call back to explain reason it needs done before she schedules. Ph# [phone]

## 2015-11-21 ENCOUNTER — Encounter: Payer: Self-pay | Admitting: Family Medicine

## 2015-11-21 ENCOUNTER — Ambulatory Visit (INDEPENDENT_AMBULATORY_CARE_PROVIDER_SITE_OTHER): Payer: Medicare Other | Admitting: Family Medicine

## 2015-11-21 DIAGNOSIS — Z23 Encounter for immunization: Secondary | ICD-10-CM

## 2015-11-21 DIAGNOSIS — J189 Pneumonia, unspecified organism: Secondary | ICD-10-CM | POA: Diagnosis not present

## 2015-11-21 MED ORDER — TETANUS-DIPHTH-ACELL PERTUSSIS 5-2-15.5 LF-MCG/0.5 IM SUSP: 0.5000 mL | Freq: Once | INTRAMUSCULAR | 0 refills | Status: AC

## 2015-11-21 NOTE — Patient Instructions (Signed)

## 2015-11-21 NOTE — Progress Notes (Signed)
Pre visit review using our clinic review tool, if applicable. No additional management support is needed unless otherwise documented below in the visit note. 

## 2015-11-21 NOTE — Progress Notes (Signed)
Patient ID: Amanda [name], female    DOB: October 17, 1940  Age: 75 y.o. MRN: [id number]    Subjective:  Subjective  HPI REESHEMAH TETTERTON presents for f/u cat scan of chest.  No complaints.     Review of Systems  Constitutional: Negative for appetite change, diaphoresis, fatigue and unexpected weight change.  Eyes: Negative for pain, redness and visual disturbance.  Respiratory: Negative for cough, chest tightness, shortness of breath and wheezing.   Cardiovascular: Negative for chest pain, palpitations and leg swelling.  Endocrine: Negative for cold intolerance, heat intolerance, polydipsia, polyphagia and polyuria.  Genitourinary: Negative for difficulty urinating, dysuria and frequency.  Neurological: Negative for dizziness, light-headedness, numbness and headaches.    History Past Medical History:  Diagnosis Date  . Constipation, chronic   . GERD (gastroesophageal reflux disease)    zantac  . History of blood transfusion [place]  . Hyperlipidemia   . Hypothyroidism   . Macular degeneration 2013   Both eyes   . Osteopenia   . Pneumonia   . PONV (postoperative nausea and vomiting)    needs little anesthesia  . Shingles   . Shortness of breath    on exertion  . SUI (stress urinary incontinence, female)     She has a past surgical history that includes Tonsillectomy and adenoidectomy; Cesarean section [id number]); Wisdom tooth extraction; Cataract extraction (2009, 2011); Tubal ligation; Dilation and curettage of uterus; Carpal tunnel release (1999); Colonoscopy; and Hysteroscopy w/D&C (01/07/2012).   Her family history includes Breast cancer (age of onset: 72) in her mother; COPD in her paternal grandfather; Diabetes in her father; Hyperlipidemia in her brother; Hypertension in her father; Kidney failure in her father; Ovarian cancer in her mother; Prostate cancer in her father; Stroke in her maternal grandfather.She reports that she has never smoked. She has never used smokeless  tobacco. She reports that she drinks alcohol. She reports that she does not use drugs.  Current Outpatient Prescriptions on File Prior to Visit  Medication Sig Dispense Refill  . acetaminophen (TYLENOL) 500 MG tablet Take 1,000 mg by mouth every 6 (six) hours as needed for mild pain.    . Ascorbic Acid (VITAMIN C CR) 1000 MG TBCR Take 1 each by mouth daily. EMERGEN-C PACKET    . buPROPion (WELLBUTRIN XL) 300 MG 24 hr tablet TAKE 1 TABLET(300 MG) BY MOUTH DAILY 90 tablet 3  . cholecalciferol (VITAMIN D) 1000 units tablet Take 1,000 Units by mouth daily.    . Multiple Vitamins-Minerals (ICAPS) TABS Take 2 tablets by mouth daily. AREDS 1+2    . SYNTHROID 137 MCG tablet TAKE 1 TABLET BY MOUTH EVERY DAY BEFORE BREAKFAST. 30 tablet 5   No current facility-administered medications on file prior to visit.      Objective:  Objective  Physical Exam  Constitutional: She is oriented to person, place, and time. She appears well-developed and well-nourished.  HENT:  Head: Normocephalic and atraumatic.  Eyes: Conjunctivae and EOM are normal.  Neck: Normal range of motion. Neck supple. No JVD present. Carotid bruit is not present. No thyromegaly present.  Cardiovascular: Normal rate, regular rhythm and normal heart sounds.   No murmur heard. Pulmonary/Chest: Effort normal and breath sounds normal. No respiratory distress. She has no wheezes. She has no rales. She exhibits no tenderness.  Musculoskeletal: She exhibits no edema.  Neurological: She is alert and oriented to person, place, and time.  Psychiatric: She has a normal mood and affect.   BP 128/78 (  BP Location: Right Arm, Patient Position: Sitting, Cuff Size: Normal)   Pulse 68   Temp 98.1 F (36.7 C) (Oral)   Resp 16   Ht 5\' 2"  (1.575 m)   Wt 149 lb 3.2 oz (67.7 kg)   SpO2 97%   BMI 27.29 kg/m  Wt Readings from Last 3 Encounters:  11/21/15 149 lb 3.2 oz (67.7 kg)  10/27/15 150 lb (68 kg)  10/26/15 144 lb (65.3 kg)     Lab Results   Component Value Date   WBC 6.5 10/26/2015   HGB 11.7 (L) 10/26/2015   HCT 35.1 (L) 10/26/2015   PLT 256 10/26/2015   GLUCOSE 77 11/20/2015   CHOL 217 (H) 06/05/2015   TRIG 114.0 06/05/2015   HDL 64.20 06/05/2015   LDLDIRECT 142.8 11/22/2010   LDLCALC 130 (H) 06/05/2015   ALT 13 11/20/2015   AST 15 11/20/2015   NA 142 11/20/2015   K 4.7 11/20/2015   CL 104 11/20/2015   CREATININE 1.13 11/20/2015   BUN 21 11/20/2015   CO2 32 11/20/2015   TSH 3.68 10/11/2015   INR 1.32 11/18/2014   HGBA1C 6.1 (H) 11/18/2014    Dg Chest 2 View  Result Date: 11/20/2015 CLINICAL DATA:  Cough for 3 weeks. EXAM: CHEST  2 VIEW COMPARISON:  Radiographs of October 27, 2015. FINDINGS: Stable cardiomediastinal silhouette. No pneumothorax or pleural effusion is noted. Bony thorax is unremarkable. Stable ill-defined opacities are noted in the lingular segment of left lung as well as in the right middle lobe. This may simply represent scarring, but CT scan of the chest is recommended to rule out possible underlying mass or neoplasm. IMPRESSION: Stable bilateral lung opacities as described above, which may simply represent scarring, but CT scan of the chest is recommended to rule out possible underlying mass or neoplasm. Electronically Signed   By: Marijo Conception, M.D.   On: 11/20/2015 09:28   Ct Chest W Contrast  Result Date: 11/21/2015 CLINICAL DATA:  Persistent infiltrates on recent chest x-ray EXAM: CT CHEST WITH CONTRAST TECHNIQUE: Multidetector CT imaging of the chest was performed during intravenous contrast administration. CONTRAST:  40mL ISOVUE-300 IOPAMIDOL (ISOVUE-300) INJECTION 61% COMPARISON:  11/20/2015, 10/27/2015, 11/23/2014 FINDINGS: Cardiovascular: Thoracic aorta demonstrates some mild calcifications without evidence of aneurysmal dilatation or dissection. Mild ectasia of the ascending aorta is 3.8 cm is noted. The pulmonary artery as visualized is within normal limits. No pulmonary emboli are noted.  Cardiac structures are stable in appearance. Mediastinum/Nodes: Scattered small mediastinal lymph nodes are again identified and stable the esophagus as visualized is within normal limits. Lungs/Pleura: The lungs are well aerated bilaterally. There are areas of apical scarring stable from previous exams. Right middle lobe scarring is again identified anteriorly also stable from the prior study. Vague 5 mm nodule is noted in the lateral aspect of the left lower lobe best seen on image number 103 of series 3. This was not well appreciated on the prior exam although this may have been related to the thicker slice sections on the prior study. No sizable effusion or pneumothorax is identified. Upper Abdomen: Within normal limits. Musculoskeletal: Degenerative changes of the thoracic spine are noted. IMPRESSION: Changes of bilateral scarring similar to that seen on prior exams. Vague 5 mm nodule in the lateral aspect of the left lower lobe as described. No follow-up needed if patient is low-risk. Non-contrast chest CT can be considered in 12 months if patient is high-risk. This recommendation follows the consensus statement: Guidelines for  Management of Incidental Pulmonary Nodules Detected on CT Images: From the Fleischner Society 2017; Radiology 2017; 284:228-243. Mild ectasia of the ascending aorta stable from the prior exam. Electronically Signed   By: Inez Catalina M.D.   On: 11/21/2015 08:41     Assessment & Plan:  Plan  I have discontinued Ms. Gaba's vitamin B-12, tamsulosin, and azithromycin. I am also having her start on Tdap. Additionally, I am having her maintain her ICAPS, Vitamin C CR, cholecalciferol, SYNTHROID, acetaminophen, and buPROPion.  Meds ordered this encounter  Medications  . Tdap (ADACEL) 07-03-13.5 LF-MCG/0.5 injection    Sig: Inject 0.5 mLs into the muscle once.    Dispense:  0.5 mL    Refill:  0    Problem List Items Addressed This Visit      Unprioritized   CAP (community  acquired pneumonia)    Resolved Ct chest reviewed with pt       Other Visit Diagnoses    Encounter for immunization       Relevant Orders   Flu vaccine HIGH DOSE PF (Completed)      Follow-up: Return in about 6 months (around 05/20/2016).  [name], DO

## 2015-11-22 DIAGNOSIS — Z85828 Personal history of other malignant neoplasm of skin: Secondary | ICD-10-CM | POA: Diagnosis not present

## 2015-11-22 DIAGNOSIS — D2272 Melanocytic nevi of left lower limb, including hip: Secondary | ICD-10-CM | POA: Diagnosis not present

## 2015-11-22 DIAGNOSIS — L821 Other seborrheic keratosis: Secondary | ICD-10-CM | POA: Diagnosis not present

## 2015-11-22 DIAGNOSIS — L720 Epidermal cyst: Secondary | ICD-10-CM | POA: Diagnosis not present

## 2015-11-22 DIAGNOSIS — Z86018 Personal history of other benign neoplasm: Secondary | ICD-10-CM | POA: Diagnosis not present

## 2015-11-22 DIAGNOSIS — L570 Actinic keratosis: Secondary | ICD-10-CM | POA: Diagnosis not present

## 2015-11-22 DIAGNOSIS — D1724 Benign lipomatous neoplasm of skin and subcutaneous tissue of left leg: Secondary | ICD-10-CM | POA: Diagnosis not present

## 2015-11-22 DIAGNOSIS — D2371 Other benign neoplasm of skin of right lower limb, including hip: Secondary | ICD-10-CM | POA: Diagnosis not present

## 2015-11-22 NOTE — Assessment & Plan Note (Signed)
Resolved Ct chest reviewed with pt

## 2015-11-23 DIAGNOSIS — H40013 Open angle with borderline findings, low risk, bilateral: Secondary | ICD-10-CM | POA: Diagnosis not present

## 2015-11-23 DIAGNOSIS — H53003 Unspecified amblyopia, bilateral: Secondary | ICD-10-CM | POA: Diagnosis not present

## 2015-11-23 DIAGNOSIS — H04123 Dry eye syndrome of bilateral lacrimal glands: Secondary | ICD-10-CM | POA: Diagnosis not present

## 2015-11-23 DIAGNOSIS — H524 Presbyopia: Secondary | ICD-10-CM | POA: Diagnosis not present

## 2016-02-09 ENCOUNTER — Other Ambulatory Visit: Payer: Self-pay

## 2016-03-21 ENCOUNTER — Other Ambulatory Visit: Payer: Self-pay | Admitting: Gynecology

## 2016-03-21 DIAGNOSIS — Z1231 Encounter for screening mammogram for malignant neoplasm of breast: Secondary | ICD-10-CM

## 2016-04-30 ENCOUNTER — Encounter (HOSPITAL_BASED_OUTPATIENT_CLINIC_OR_DEPARTMENT_OTHER): Payer: Self-pay

## 2016-04-30 ENCOUNTER — Ambulatory Visit (HOSPITAL_BASED_OUTPATIENT_CLINIC_OR_DEPARTMENT_OTHER)
Admission: RE | Admit: 2016-04-30 | Discharge: 2016-04-30 | Disposition: A | Discharge status: Home / Self Care | Payer: Medicare Other | Source: Ambulatory Visit | Attending: Gynecology | Admitting: Gynecology

## 2016-04-30 DIAGNOSIS — Z1231 Encounter for screening mammogram for malignant neoplasm of breast: Secondary | ICD-10-CM | POA: Insufficient documentation

## 2016-06-11 ENCOUNTER — Other Ambulatory Visit: Payer: Self-pay | Admitting: *Deleted

## 2016-06-11 ENCOUNTER — Ambulatory Visit: Payer: Medicare Other

## 2016-06-11 MED ORDER — ZOSTER VAC RECOMB ADJUVANTED 50 MCG/0.5ML IM SUSR: 0.5000 mL | Freq: Once | INTRAMUSCULAR | 1 refills | Status: AC

## 2016-06-19 DIAGNOSIS — H40013 Open angle with borderline findings, low risk, bilateral: Secondary | ICD-10-CM | POA: Diagnosis not present

## 2016-06-19 DIAGNOSIS — Z961 Presence of intraocular lens: Secondary | ICD-10-CM | POA: Diagnosis not present

## 2016-06-19 DIAGNOSIS — H353132 Nonexudative age-related macular degeneration, bilateral, intermediate dry stage: Secondary | ICD-10-CM | POA: Diagnosis not present

## 2016-06-19 DIAGNOSIS — H35033 Hypertensive retinopathy, bilateral: Secondary | ICD-10-CM | POA: Diagnosis not present

## 2016-07-10 ENCOUNTER — Other Ambulatory Visit: Payer: Self-pay | Admitting: Family Medicine

## 2016-07-17 ENCOUNTER — Encounter: Payer: Self-pay | Admitting: Gynecology

## 2016-08-12 ENCOUNTER — Telehealth: Payer: Self-pay | Admitting: Family Medicine

## 2016-08-12 NOTE — Telephone Encounter (Signed)
Called patient to schedule awv. Lvm for patient to call office to schedule appt.  °

## 2016-08-12 NOTE — Telephone Encounter (Signed)
Pt called office. Scheduled awv for 08/20/2016.

## 2016-08-19 NOTE — Progress Notes (Deleted)
Subjective:   Amanda [name] is a 76 y.o. female who presents for Medicare Annual (Subsequent) preventive examination.  Review of Systems:  No ROS.  Medicare Wellness Visit. Additional risk factors are reflected in the social history.    Sleep patterns:    Home Safety/Smoke Alarms: Feels safe in home. Smoke alarms in place.  Living environment; residence and Firearm Safety:  [place] Safety/Bike Helmet: Wears seat belt.   Counseling:   Eye Exam-  Dental-  Female:   Pap- Last 12/13/11: normal    Mammo- Last 05/01/16: BI-RADS CATEGORY  1: Negative.      Dexa scan-  Last 08/10/15: osteopenia.      CCS- Last 05/06/12: normal     Objective:     Vitals: There were no vitals taken for this visit.  There is no height or weight on file to calculate BMI.   Tobacco History  Smoking Status  . Never Smoker  Smokeless Tobacco  . Never Used     Counseling given: Not Answered   Past Medical History:  Diagnosis Date  . Constipation, chronic   . GERD (gastroesophageal reflux disease)    zantac  . History of blood transfusion [place]  . Hyperlipidemia   . Hypothyroidism   . Macular degeneration 2013   Both eyes   . Osteopenia   . Pneumonia   . PONV (postoperative nausea and vomiting)    needs little anesthesia  . Shingles   . Shortness of breath    on exertion  . SUI (stress urinary incontinence, female)    Past Surgical History:  Procedure Laterality Date  . BREAST EXCISIONAL BIOPSY Left 1980  . CARPAL TUNNEL RELEASE  1999  . CATARACT EXTRACTION  2009, 2011   BOTH EYES  . [place]  . COLONOSCOPY      Dr Cristina Gong  . DILATION AND CURETTAGE OF UTERUS     X2  . HYSTEROSCOPY W/D&C  01/07/2012   Procedure: DILATATION AND CURETTAGE /HYSTEROSCOPY;  Surgeon: Terrance Mass, MD;  Location: [facility];  Service: Gynecology;  Laterality: N/A;  intrauterine foley catheter for tamponode   . TONSILLECTOMY AND ADENOIDECTOMY    . TUBAL LIGATION     BY  LAPAROSCOPY  . WISDOM TOOTH EXTRACTION     Family History  Problem Relation Age of Onset  . Ovarian cancer Mother   . Breast cancer Mother 66  . Hypertension Father   . Prostate cancer Father   . Kidney failure Father   . Diabetes Father   . Hyperlipidemia Brother   . COPD Paternal Grandfather   . Stroke Maternal Grandfather    History  Sexual Activity  . Sexual activity: Yes  . Birth control/ protection: Post-menopausal    Outpatient Encounter Prescriptions as of 08/20/2016  Medication Sig  . acetaminophen (TYLENOL) 500 MG tablet Take 1,000 mg by mouth every 6 (six) hours as needed for mild pain.  . Ascorbic Acid (VITAMIN C CR) 1000 MG TBCR Take 1 each by mouth daily. EMERGEN-C PACKET  . buPROPion (WELLBUTRIN XL) 300 MG 24 hr tablet TAKE 1 TABLET(300 MG) BY MOUTH DAILY  . cholecalciferol (VITAMIN D) 1000 units tablet Take 1,000 Units by mouth daily.  . Multiple Vitamins-Minerals (ICAPS) TABS Take 2 tablets by mouth daily. AREDS 1+2  . SYNTHROID 137 MCG tablet TAKE 1 TABLET BY MOUTH EVERY DAY BEFORE BREAKFAST   No facility-administered encounter medications on file as of 08/20/2016.  Activities of Daily Living No flowsheet data found.  Patient Care Team: Carollee Herter, Alferd Apa, DO as PCP - General (Family Medicine) Jari Pigg, MD as Consulting Physician (Dermatology) Monna Fam, MD as Consulting Physician (Ophthalmology) Terrance Mass, MD as Consulting Physician (Gynecology)    Assessment:    Physical assessment deferred to PCP.  Exercise Activities and Dietary recommendations   Diet (meal preparation, eat out, water intake, caffeinated beverages, dairy products, fruits and vegetables): {Desc; diets:16563} Breakfast: Lunch:  Dinner:      Goals    None     Fall Risk Fall Risk  02/09/2016 12/05/2014 08/26/2013  Falls in the past year? No No No   Depression Screen PHQ 2/9 Scores 12/05/2014 08/26/2013  PHQ - 2 Score 0 0     Cognitive Function          Immunization History  Administered Date(s) Administered  . Influenza Whole 03/04/2001, 01/12/2009, 04/04/2010  . Influenza, High Dose Seasonal PF 01/20/2014, 12/05/2014, 11/21/2015  . Influenza,inj,Quad PF,36+ Mos 01/21/2013  . Pneumococcal Conjugate-13 01/20/2014  . Pneumococcal Polysaccharide-23 07/10/2012  . Td 09/01/1997   Screening Tests Health Maintenance  Topic Date Due  . TETANUS/TDAP  09/02/2007  . INFLUENZA VACCINE  10/02/2016  . DEXA SCAN  Completed  . PNA vac Low Risk Adult  Completed      Plan:   ***   I have personally reviewed and noted the following in the patient's chart:   . Medical and social history . Use of alcohol, tobacco or illicit drugs  . Current medications and supplements . Functional ability and status . Nutritional status . Physical activity . Advanced directives . List of other physicians . Hospitalizations, surgeries, and ER visits in previous 12 months . Vitals . Screenings to include cognitive, depression, and falls . Referrals and appointments  In addition, I have reviewed and discussed with patient certain preventive protocols, quality metrics, and best practice recommendations. A written personalized care plan for preventive services as well as general preventive health recommendations were provided to patient.     Naaman Plummer [place], [place]  08/19/2016

## 2016-08-20 ENCOUNTER — Ambulatory Visit: Payer: Medicare Other | Admitting: *Deleted

## 2016-08-23 ENCOUNTER — Ambulatory Visit: Payer: Medicare Other | Admitting: Family Medicine

## 2016-08-23 ENCOUNTER — Telehealth: Payer: Self-pay | Admitting: Family Medicine

## 2016-08-23 NOTE — Telephone Encounter (Addendum)
Patient lvm 11:13am cancelling 4pm appointment stating she feels better, charge or no charge

## 2016-08-23 NOTE — Telephone Encounter (Signed)
No charge -- block

## 2016-08-27 NOTE — Progress Notes (Signed)
Subjective:   Amanda [name] is a 76 y.o. female who presents for Medicare Annual (Subsequent) preventive examination.  Pt very pleasant and appears in good health. Pt states she tries to eat healthy, walk her dog for exercise, and take as little medication as possible.  Review of Systems:  No ROS.  Medicare Wellness Visit. Additional risk factors are reflected in the social history. Cardiac Risk Factors include: advanced age (>102men, >75 women);dyslipidemia Sleep patterns:  Sleeps 8 hrs per night. Feels rested. Home Safety/Smoke Alarms: Feels safe in home. Smoke alarms in place.  Living environment; residence and Firearm Safety: Lives with husband. 1 story. No guns. Seat Belt Safety/Bike Helmet: Wears seat belt.   Counseling:   Eye Exam- Wearing glasses. Hx cataract sx. Dr.Hecker every 6 months.  Dental- Dr.Fisher every 6 months.   Female:   Pap-   Last 12/13/11: normal Mammo-  Last 05/01/16: BI-RADS CATEGORY  1: Negative.     Dexa scan-  Last 08/10/15:  osteopenia     CCS- Last 05/06/12: normal. No routine follow up recommended.     Objective:     Vitals: BP (!) 146/78 (BP Location: Right Arm, Patient Position: Sitting, Cuff Size: Normal)   Pulse 61   Ht 5\' 2"  (1.575 m)   Wt 150 lb (68 kg)   SpO2 97%   BMI 27.44 kg/m   Body mass index is 27.44 kg/m.   Tobacco History  Smoking Status  . Never Smoker  Smokeless Tobacco  . Never Used     Counseling given: No   Past Medical History:  Diagnosis Date  . Constipation, chronic   . GERD (gastroesophageal reflux disease)    zantac  . History of blood transfusion [place]  . Hyperlipidemia   . Hypothyroidism   . Macular degeneration 2013   Both eyes   . Osteopenia   . Pneumonia   . PONV (postoperative nausea and vomiting)    needs little anesthesia  . Shingles   . Shortness of breath    on exertion  . SUI (stress urinary incontinence, female)    Past Surgical History:  Procedure Laterality Date  .  BREAST EXCISIONAL BIOPSY Left 1980  . CARPAL TUNNEL RELEASE  1999  . CATARACT EXTRACTION  2009, 2011   BOTH EYES  . [place]  . COLONOSCOPY      Dr Cristina Gong  . DILATION AND CURETTAGE OF UTERUS     X2  . HYSTEROSCOPY W/D&C  01/07/2012   Procedure: DILATATION AND CURETTAGE /HYSTEROSCOPY;  Surgeon: Terrance Mass, MD;  Location: [facility];  Service: Gynecology;  Laterality: N/A;  intrauterine foley catheter for tamponode   . TONSILLECTOMY AND ADENOIDECTOMY    . TUBAL LIGATION     BY LAPAROSCOPY  . WISDOM TOOTH EXTRACTION     Family History  Problem Relation Age of Onset  . Ovarian cancer Mother   . Breast cancer Mother 64  . Hypertension Father   . Prostate cancer Father   . Kidney failure Father   . Diabetes Father   . Hyperlipidemia Brother   . COPD Paternal Grandfather   . Stroke Maternal Grandfather    History  Sexual Activity  . Sexual activity: No    Outpatient Encounter Prescriptions as of 08/29/2016  Medication Sig  . acetaminophen (TYLENOL) 500 MG tablet Take 1,000 mg by mouth every 6 (six) hours as needed for mild pain.  . Ascorbic Acid (VITAMIN C CR) 1000 MG  TBCR Take 1 each by mouth daily. EMERGEN-C PACKET  . buPROPion (WELLBUTRIN XL) 300 MG 24 hr tablet TAKE 1 TABLET(300 MG) BY MOUTH DAILY  . cholecalciferol (VITAMIN D) 1000 units tablet Take 1,000 Units by mouth daily.  [name] SYNTHROID 137 MCG tablet TAKE 1 TABLET BY MOUTH EVERY DAY BEFORE BREAKFAST  . Multiple Vitamins-Minerals (ICAPS) TABS Take 2 tablets by mouth daily. AREDS 1+2   No facility-administered encounter medications on file as of 08/29/2016.     Activities of Daily Living In your present state of health, do you have any difficulty performing the following activities: 08/29/2016  Hearing? N  Vision? N  Difficulty concentrating or making decisions? N  Walking or climbing stairs? N  Dressing or bathing? N  Doing errands, shopping? N  Preparing Food and eating ? N  Using the Toilet? N    In the past six months, have you accidently leaked urine? Y  Do you have problems with loss of bowel control? N  Managing your Medications? N  Managing your Finances? N  Housekeeping or managing your Housekeeping? N  Some recent data might be hidden    Patient Care Team: Carollee Herter, Alferd Apa, DO as PCP - General (Family Medicine) Jari Pigg, MD as Consulting Physician (Dermatology) Monna Fam, MD as Consulting Physician (Ophthalmology) Terrance Mass, MD as Consulting Physician (Gynecology)    Assessment:    Physical assessment deferred to PCP.  Exercise Activities and Dietary recommendations Current Exercise Habits: Home exercise routine, Type of exercise: walking, Time (Minutes): 30, Frequency (Times/Week): 7, Weekly Exercise (Minutes/Week): 210, Intensity: Mild   Diet (meal preparation, eat out, water intake, caffeinated beverages, dairy products, fruits and vegetables): well balanced  Goals      Patient Stated   . lose 10 lbs. Today 150lbs (pt-stated)          By exercising more.       Fall Risk Fall Risk  08/29/2016 02/09/2016 12/05/2014 08/26/2013  Falls in the past year? No No No No   Depression Screen PHQ 2/9 Scores 08/29/2016 12/05/2014 08/26/2013  PHQ - 2 Score 0 0 0     Cognitive Function MMSE - Mini Mental State Exam 08/29/2016  Orientation to time 5  Orientation to Place 5  Registration 3  Attention/ Calculation 5  Recall 3  Language- name 2 objects 2  Language- repeat 1  Language- follow 3 step command 3  Language- read & follow direction 1  Write a sentence 1  Copy design 1  Total score 30        Immunization History  Administered Date(s) Administered  . Influenza Whole 03/04/2001, 01/12/2009, 04/04/2010  . Influenza, High Dose Seasonal PF 01/20/2014, 12/05/2014, 11/21/2015  . Influenza,inj,Quad PF,36+ Mos 01/21/2013  . Pneumococcal Conjugate-13 01/20/2014  . Pneumococcal Polysaccharide-23 07/10/2012  . Td 09/01/1997   Screening  Tests Health Maintenance  Topic Date Due  . TETANUS/TDAP  09/02/2007  . INFLUENZA VACCINE  10/02/2016  . DEXA SCAN  Completed  . PNA vac Low Risk Adult  Completed      Plan:   Schedule appointment to follow up with Dr.Lowne.  Continue to eat heart healthy diet (full of fruits, vegetables, whole grains, lean protein, water--limit salt, fat, and sugar intake) and increase physical activity as tolerated.  Continue doing brain stimulating activities (puzzles, reading, adult coloring books, staying active) to keep memory sharp.   Pt expresses she would like to come off of Wellbutrin. PCP states okay to call in decreased dose  for 30 day supply and then stop if feeling well, but pt states she will call back after she finishes the 90 supply that she just had filled.   I have personally reviewed and noted the following in the patient's chart:   . Medical and social history . Use of alcohol, tobacco or illicit drugs  . Current medications and supplements . Functional ability and status . Nutritional status . Physical activity . Advanced directives . List of other physicians . Hospitalizations, surgeries, and ER visits in previous 12 months . Vitals . Screenings to include cognitive, depression, and falls . Referrals and appointments  In addition, I have reviewed and discussed with patient certain preventive protocols, quality metrics, and best practice recommendations. A written personalized care plan for preventive services as well as general preventive health recommendations were provided to patient.     Naaman Plummer [place], [place]  08/29/2016

## 2016-08-29 ENCOUNTER — Encounter: Payer: Self-pay | Admitting: *Deleted

## 2016-08-29 ENCOUNTER — Ambulatory Visit (INDEPENDENT_AMBULATORY_CARE_PROVIDER_SITE_OTHER): Payer: Medicare Other | Admitting: *Deleted

## 2016-08-29 VITALS — BP 146/78 | HR 61 | Ht 62.0 in | Wt 150.0 lb

## 2016-08-29 DIAGNOSIS — Z0000 Encounter for general adult medical examination without abnormal findings: Secondary | ICD-10-CM | POA: Diagnosis not present

## 2016-08-29 NOTE — Patient Instructions (Signed)
Amanda [name] , Thank you for taking time to come for your Medicare Wellness Visit. I appreciate your ongoing commitment to your health goals. Please review the following plan we discussed and let me know if I can assist you in the future.   These are the goals we discussed: Goals      Patient Stated   . lose 10 lbs. Today 150lbs (pt-stated)          By exercising more.        This is a list of the screening recommended for you and due dates:  Health Maintenance  Topic Date Due  . Tetanus Vaccine  09/02/2007  . Flu Shot  10/02/2016  . DEXA scan (bone density measurement)  Completed  . Pneumonia vaccines  Completed   Schedule appointment to follow up with Dr.Lowne.  Continue to eat heart healthy diet (full of fruits, vegetables, whole grains, lean protein, water--limit salt, fat, and sugar intake) and increase physical activity as tolerated.  Continue doing brain stimulating activities (puzzles, reading, adult coloring books, staying active) to keep memory sharp.   Health Maintenance for Postmenopausal Women Menopause is a normal process in which your reproductive ability comes to an end. This process happens gradually over a span of months to years, usually between the ages of 49 and 71. Menopause is complete when you have missed 12 consecutive menstrual periods. It is important to talk with your health care provider about some of the most common conditions that affect postmenopausal women, such as heart disease, cancer, and bone loss (osteoporosis). Adopting a healthy lifestyle and getting preventive care can help to promote your health and wellness. Those actions can also lower your chances of developing some of these common conditions. What should I know about menopause? During menopause, you may experience a number of symptoms, such as:  Moderate-to-severe hot flashes.  Night sweats.  Decrease in sex drive.  Mood swings.  Headaches.  Tiredness.  Irritability.  Memory  problems.  Insomnia.  Choosing to treat or not to treat menopausal changes is an individual decision that you make with your health care provider. What should I know about hormone replacement therapy and supplements? Hormone therapy products are effective for treating symptoms that are associated with menopause, such as hot flashes and night sweats. Hormone replacement carries certain risks, especially as you become older. If you are thinking about using estrogen or estrogen with progestin treatments, discuss the benefits and risks with your health care provider. What should I know about heart disease and stroke? Heart disease, heart attack, and stroke become more likely as you age. This may be due, in part, to the hormonal changes that your body experiences during menopause. These can affect how your body processes dietary fats, triglycerides, and cholesterol. Heart attack and stroke are both medical emergencies. There are many things that you can do to help prevent heart disease and stroke:  Have your blood pressure checked at least every 1-2 years. High blood pressure causes heart disease and increases the risk of stroke.  If you are 82-48 years old, ask your health care provider if you should take aspirin to prevent a heart attack or a stroke.  Do not use any tobacco products, including cigarettes, chewing tobacco, or electronic cigarettes. If you need help quitting, ask your health care provider.  It is important to eat a healthy diet and maintain a healthy weight. ? Be sure to include plenty of vegetables, fruits, low-fat dairy products, and lean protein. ?  Avoid eating foods that are high in solid fats, added sugars, or salt (sodium).  Get regular exercise. This is one of the most important things that you can do for your health. ? Try to exercise for at least 150 minutes each week. The type of exercise that you do should increase your heart rate and make you sweat. This is known as  moderate-intensity exercise. ? Try to do strengthening exercises at least twice each week. Do these in addition to the moderate-intensity exercise.  Know your numbers.Ask your health care provider to check your cholesterol and your blood glucose. Continue to have your blood tested as directed by your health care provider.  What should I know about cancer screening? There are several types of cancer. Take the following steps to reduce your risk and to catch any cancer development as early as possible. Breast Cancer  Practice breast self-awareness. ? This means understanding how your breasts normally appear and feel. ? It also means doing regular breast self-exams. Let your health care provider know about any changes, no matter how small.  If you are 31 or older, have a clinician do a breast exam (clinical breast exam or CBE) every year. Depending on your age, family history, and medical history, it may be recommended that you also have a yearly breast X-ray (mammogram).  If you have a family history of breast cancer, talk with your health care provider about genetic screening.  If you are at high risk for breast cancer, talk with your health care provider about having an MRI and a mammogram every year.  Breast cancer (BRCA) gene test is recommended for women who have family members with BRCA-related cancers. Results of the assessment will determine the need for genetic counseling and BRCA1 and for BRCA2 testing. BRCA-related cancers include these types: ? Breast. This occurs in males or females. ? Ovarian. ? Tubal. This may also be called fallopian tube cancer. ? Cancer of the abdominal or pelvic lining (peritoneal cancer). ? Prostate. ? Pancreatic.  Cervical, Uterine, and Ovarian Cancer Your health care provider may recommend that you be screened regularly for cancer of the pelvic organs. These include your ovaries, uterus, and vagina. This screening involves a pelvic exam, which  includes checking for microscopic changes to the surface of your cervix (Pap test).  For women ages 21-65, health care providers may recommend a pelvic exam and a Pap test every three years. For women ages 26-65, they may recommend the Pap test and pelvic exam, combined with testing for human papilloma virus (HPV), every five years. Some types of HPV increase your risk of cervical cancer. Testing for HPV may also be done on women of any age who have unclear Pap test results.  Other health care providers may not recommend any screening for nonpregnant women who are considered low risk for pelvic cancer and have no symptoms. Ask your health care provider if a screening pelvic exam is right for you.  If you have had past treatment for cervical cancer or a condition that could lead to cancer, you need Pap tests and screening for cancer for at least 20 years after your treatment. If Pap tests have been discontinued for you, your risk factors (such as having a new sexual partner) need to be reassessed to determine if you should start having screenings again. Some women have medical problems that increase the chance of getting cervical cancer. In these cases, your health care provider may recommend that you have screening and Pap  tests more often.  If you have a family history of uterine cancer or ovarian cancer, talk with your health care provider about genetic screening.  If you have vaginal bleeding after reaching menopause, tell your health care provider.  There are currently no reliable tests available to screen for ovarian cancer.  Lung Cancer Lung cancer screening is recommended for adults 78-68 years old who are at high risk for lung cancer because of a history of smoking. A yearly low-dose CT scan of the lungs is recommended if you:  Currently smoke.  Have a history of at least 30 pack-years of smoking and you currently smoke or have quit within the past 15 years. A pack-year is smoking an  average of one pack of cigarettes per day for one year.  Yearly screening should:  Continue until it has been 15 years since you quit.  Stop if you develop a health problem that would prevent you from having lung cancer treatment.  Colorectal Cancer  This type of cancer can be detected and can often be prevented.  Routine colorectal cancer screening usually begins at age 21 and continues through age 49.  If you have risk factors for colon cancer, your health care provider may recommend that you be screened at an earlier age.  If you have a family history of colorectal cancer, talk with your health care provider about genetic screening.  Your health care provider may also recommend using home test kits to check for hidden blood in your stool.  A small camera at the end of a tube can be used to examine your colon directly (sigmoidoscopy or colonoscopy). This is done to check for the earliest forms of colorectal cancer.  Direct examination of the colon should be repeated every 5-10 years until age 63. However, if early forms of precancerous polyps or small growths are found or if you have a family history or genetic risk for colorectal cancer, you may need to be screened more often.  Skin Cancer  Check your skin from head to toe regularly.  Monitor any moles. Be sure to tell your health care provider: ? About any new moles or changes in moles, especially if there is a change in a mole's shape or color. ? If you have a mole that is larger than the size of a pencil eraser.  If any of your family members has a history of skin cancer, especially at a young age, talk with your health care provider about genetic screening.  Always use sunscreen. Apply sunscreen liberally and repeatedly throughout the day.  Whenever you are outside, protect yourself by wearing long sleeves, pants, a wide-brimmed hat, and sunglasses.  What should I know about osteoporosis? Osteoporosis is a condition in  which bone destruction happens more quickly than new bone creation. After menopause, you may be at an increased risk for osteoporosis. To help prevent osteoporosis or the bone fractures that can happen because of osteoporosis, the following is recommended:  If you are 19-32 years old, get at least 1,000 mg of calcium and at least 600 mg of vitamin D per day.  If you are older than age 43 but younger than age 68, get at least 1,200 mg of calcium and at least 600 mg of vitamin D per day.  If you are older than age 49, get at least 1,200 mg of calcium and at least 800 mg of vitamin D per day.  Smoking and excessive alcohol intake increase the risk of osteoporosis. Eat  foods that are rich in calcium and vitamin D, and do weight-bearing exercises several times each week as directed by your health care provider. What should I know about how menopause affects my mental health? Depression may occur at any age, but it is more common as you become older. Common symptoms of depression include:  Low or sad mood.  Changes in sleep patterns.  Changes in appetite or eating patterns.  Feeling an overall lack of motivation or enjoyment of activities that you previously enjoyed.  Frequent crying spells.  Talk with your health care provider if you think that you are experiencing depression. What should I know about immunizations? It is important that you get and maintain your immunizations. These include:  Tetanus, diphtheria, and pertussis (Tdap) booster vaccine.  Influenza every year before the flu season begins.  Pneumonia vaccine.  Shingles vaccine.  Your health care provider may also recommend other immunizations. This information is not intended to replace advice given to you by your health care provider. Make sure you discuss any questions you have with your health care provider. Document Released: 04/12/2005 Document Revised: 09/08/2015 Document Reviewed: 11/22/2014 Elsevier Interactive  Patient Education  2018 [employer].

## 2016-10-07 ENCOUNTER — Other Ambulatory Visit: Payer: Self-pay | Admitting: Family Medicine

## 2016-10-07 ENCOUNTER — Telehealth: Payer: Self-pay | Admitting: Family Medicine

## 2016-10-07 DIAGNOSIS — E039 Hypothyroidism, unspecified: Secondary | ICD-10-CM

## 2016-10-07 MED ORDER — LEVOTHYROXINE SODIUM 137 MCG PO TABS: 137.0000 ug | ORAL_TABLET | Freq: Every day | ORAL | 3 refills | Status: DC

## 2016-10-07 NOTE — Telephone Encounter (Signed)
Patient notified of change/sent into pharmacy Updated list Scheduled lab appt/put in order.

## 2016-10-07 NOTE — Telephone Encounter (Signed)
Patient is asking to switch to the generic for SYNTHROID 137 MCG tablet due to financial issues.

## 2016-10-07 NOTE — Telephone Encounter (Signed)
OK to change to generic levothyroxine at same dose as previous Synthroid, 137 mcg, 1 tab po daily, disp #30 with 1 rf and check TSH in 10-12 weeks to assess response

## 2016-10-16 ENCOUNTER — Other Ambulatory Visit: Payer: Self-pay | Admitting: Family Medicine

## 2016-10-16 NOTE — Telephone Encounter (Signed)
°  Relation to UT:MLYY Call back number: Pharmacy: [facility] [place], [address] AT [facility] [phone] (Phone) [phone] (Fax)       Reason for call:  Patient requesting a refill and stated she would like to wean off buPROPion (WELLBUTRIN XL) therefore would like a 150MG  instead of 300MG , please advise

## 2016-10-17 ENCOUNTER — Other Ambulatory Visit: Payer: Self-pay | Admitting: Family Medicine

## 2016-10-17 MED ORDER — BUPROPION HCL ER (XL) 150 MG PO TB24: 150.0000 mg | ORAL_TABLET | Freq: Every day | ORAL | 0 refills | Status: DC

## 2016-10-17 NOTE — Telephone Encounter (Signed)
Ok -- send in 150 mg 1 po qd #30  And then she can stop

## 2016-10-17 NOTE — Telephone Encounter (Signed)
Medication updated/sent in the 150 mg and patient notified. She verbalized understanding.

## 2016-11-05 ENCOUNTER — Ambulatory Visit: Payer: Medicare Other

## 2016-11-05 DIAGNOSIS — Z23 Encounter for immunization: Secondary | ICD-10-CM

## 2016-11-21 DIAGNOSIS — H40013 Open angle with borderline findings, low risk, bilateral: Secondary | ICD-10-CM | POA: Diagnosis not present

## 2016-11-21 DIAGNOSIS — H5231 Anisometropia: Secondary | ICD-10-CM | POA: Diagnosis not present

## 2016-11-21 DIAGNOSIS — H53003 Unspecified amblyopia, bilateral: Secondary | ICD-10-CM | POA: Diagnosis not present

## 2016-11-21 DIAGNOSIS — H04123 Dry eye syndrome of bilateral lacrimal glands: Secondary | ICD-10-CM | POA: Diagnosis not present

## 2016-11-27 DIAGNOSIS — Z86018 Personal history of other benign neoplasm: Secondary | ICD-10-CM | POA: Diagnosis not present

## 2016-11-27 DIAGNOSIS — L821 Other seborrheic keratosis: Secondary | ICD-10-CM | POA: Diagnosis not present

## 2016-11-27 DIAGNOSIS — B353 Tinea pedis: Secondary | ICD-10-CM | POA: Diagnosis not present

## 2016-11-27 DIAGNOSIS — Z85828 Personal history of other malignant neoplasm of skin: Secondary | ICD-10-CM | POA: Diagnosis not present

## 2016-11-27 DIAGNOSIS — L570 Actinic keratosis: Secondary | ICD-10-CM | POA: Diagnosis not present

## 2016-11-27 DIAGNOSIS — D2371 Other benign neoplasm of skin of right lower limb, including hip: Secondary | ICD-10-CM | POA: Diagnosis not present

## 2016-11-27 DIAGNOSIS — D2272 Melanocytic nevi of left lower limb, including hip: Secondary | ICD-10-CM | POA: Diagnosis not present

## 2016-11-27 DIAGNOSIS — L723 Sebaceous cyst: Secondary | ICD-10-CM | POA: Diagnosis not present

## 2017-01-07 ENCOUNTER — Other Ambulatory Visit (INDEPENDENT_AMBULATORY_CARE_PROVIDER_SITE_OTHER): Payer: Medicare Other

## 2017-01-07 DIAGNOSIS — E039 Hypothyroidism, unspecified: Secondary | ICD-10-CM | POA: Diagnosis not present

## 2017-01-07 LAB — TSH: TSH: 0.4 u[IU]/mL (ref 0.35–4.50)

## 2017-01-08 ENCOUNTER — Encounter: Payer: Self-pay | Admitting: Family Medicine

## 2017-01-09 NOTE — Addendum Note (Signed)
Addended byDamita Dunnings D on: 01/09/2017 03:06 PM   Modules accepted: Orders

## 2017-01-09 NOTE — Progress Notes (Signed)
t

## 2017-02-06 ENCOUNTER — Other Ambulatory Visit: Payer: Self-pay | Admitting: Family Medicine

## 2017-02-07 ENCOUNTER — Other Ambulatory Visit: Payer: Self-pay | Admitting: Family Medicine

## 2017-02-11 ENCOUNTER — Ambulatory Visit: Payer: Self-pay

## 2017-02-11 NOTE — Telephone Encounter (Signed)
No opening at [facility] in [facility] tomorrow am. Does not want to go to another [employer].  Pt states she is going out of town tomorrow afternoon.  Does not want to go to [place]. Pt states will go to [facility] if worsens.  Reason for Disposition . [1] Numbness (i.e., loss of sensation) of the face, arm / hand, or leg / foot on one side of the body AND [2] gradual onset (e.g., days to weeks) AND [3] present now  Answer Assessment - Initial Assessment Questions 1. SYMPTOM: "What is the main symptom you are concerned about?" (e.g., weakness, numbness)     Having intermittent numbness middle of the lips up to the cheekbone down to jawline to the ear 2. ONSET: "When did this start?" (minutes, hours, days; while sleeping)     3 weeks ago 3. LAST NORMAL: "When was the last time you were normal (no symptoms)?"     Before symptoms began 3 weeks ago 4. PATTERN "Does this come and go, or has it been constant since it started?"  "Is it present now?"     Comes and goes. It is present down. No trouble drinking or eating 5. CARDIAC SYMPTOMS: "Have you had any of the following symptoms: chest pain, difficulty breathing, palpitations?"     no 6. NEUROLOGIC SYMPTOMS: "Have you had any of the following symptoms: headache, dizziness, vision loss, double vision, changes in speech, unsteady on your feet?"     No headache, occasional dizziness with position changes, no vision loss or double vision, no changes in speech, no unsteadiness on feet 7. OTHER SYMPTOMS: "Do you have any other symptoms?"   Having mid abdomen to left side burning sensation "like sunburn" states it is intermittent. 8. PREGNANCY: "Is there any chance you are pregnant?" "When was your last menstrual period?"     n/a  Protocols used: NEUROLOGIC DEFICIT-A-AH

## 2017-02-17 ENCOUNTER — Encounter: Payer: Self-pay | Admitting: Family Medicine

## 2017-02-17 ENCOUNTER — Ambulatory Visit (INDEPENDENT_AMBULATORY_CARE_PROVIDER_SITE_OTHER): Payer: Medicare Other | Admitting: Family Medicine

## 2017-02-17 VITALS — BP 126/70 | HR 63 | Temp 97.9°F | Resp 16 | Ht 62.0 in | Wt 156.4 lb

## 2017-02-17 DIAGNOSIS — E039 Hypothyroidism, unspecified: Secondary | ICD-10-CM | POA: Diagnosis not present

## 2017-02-17 DIAGNOSIS — R200 Anesthesia of skin: Secondary | ICD-10-CM | POA: Diagnosis not present

## 2017-02-17 DIAGNOSIS — E785 Hyperlipidemia, unspecified: Secondary | ICD-10-CM | POA: Diagnosis not present

## 2017-02-17 MED ORDER — ASPIRIN EC 81 MG PO TBEC: 81.0000 mg | DELAYED_RELEASE_TABLET | Freq: Every day | ORAL | Status: DC

## 2017-02-17 NOTE — Assessment & Plan Note (Signed)
Check MRI If symptoms worsen-- go to ER

## 2017-02-17 NOTE — Patient Instructions (Signed)
Paresthesia Paresthesia is a burning or prickling feeling. This feeling can happen in any part of the body. It often happens in the hands, arms, legs, or feet. Usually, it is not painful. In most cases, the feeling goes away in a short time and is not a sign of a serious problem. Follow these instructions at home:  Avoid drinking alcohol.  Try massage or needle therapy (acupuncture) to help with your problems.  Keep all follow-up visits as told by your doctor. This is important. Contact a doctor if:  You keep on having episodes of paresthesia.  Your burning or prickling feeling gets worse when you walk.  You have pain or cramps.  You feel dizzy.  You have a rash. Get help right away if:  You feel weak.  You have trouble walking or moving.  You have problems speaking, understanding, or seeing.  You feel confused.  You cannot control when you pee (urinate) or poop (bowel movement).  You lose feeling (numbness) after an injury.  You pass out (faint). This information is not intended to replace advice given to you by your health care provider. Make sure you discuss any questions you have with your health care provider. Document Released: 02/01/2008 Document Revised: 07/27/2015 Document Reviewed: 02/14/2014 Elsevier Interactive Patient Education  2018 [employer] Inc[employer]  

## 2017-02-17 NOTE — Progress Notes (Signed)
Patient ID: Amanda [name], female   DOB: 02/25/1941, 76 y.o.   MRN: [id number]     Subjective:  I acted as a Education administrator for Dr. Carollee Herter.  Guerry Bruin, [place]   Patient ID: Amanda [name], female    DOB: 27-Jan-1941, 76 y.o.   MRN: [id number]  Chief Complaint  Patient presents with  . Numbness    right side of fac and lips    HPI  Patient is in today for numbness in face and lips.  She states that this has been going on intermittently for about one month.  Patient Care Team: Carollee Herter, Alferd Apa, DO as PCP - General (Family Medicine) Jari Pigg, MD as Consulting Physician (Dermatology) Monna Fam, MD as Consulting Physician (Ophthalmology) Terrance Mass, MD as Consulting Physician (Gynecology)   Past Medical History:  Diagnosis Date  . Constipation, chronic   . GERD (gastroesophageal reflux disease)    zantac  . History of blood transfusion [place]  . Hyperlipidemia   . Hypothyroidism   . Macular degeneration 2013   Both eyes   . Osteopenia   . Pneumonia   . PONV (postoperative nausea and vomiting)    needs little anesthesia  . Shingles   . Shortness of breath    on exertion  . SUI (stress urinary incontinence, female)     Past Surgical History:  Procedure Laterality Date  . BREAST EXCISIONAL BIOPSY Left 1980  . CARPAL TUNNEL RELEASE  1999  . CATARACT EXTRACTION  2009, 2011   BOTH EYES  . [place]  . COLONOSCOPY      Dr Cristina Gong  . DILATION AND CURETTAGE OF UTERUS     X2  . HYSTEROSCOPY W/D&C  01/07/2012   Procedure: DILATATION AND CURETTAGE /HYSTEROSCOPY;  Surgeon: Terrance Mass, MD;  Location: [facility];  Service: Gynecology;  Laterality: N/A;  intrauterine foley catheter for tamponode   . TONSILLECTOMY AND ADENOIDECTOMY    . TUBAL LIGATION     BY LAPAROSCOPY  . WISDOM TOOTH EXTRACTION      Family History  Problem Relation Age of Onset  . Ovarian cancer Mother   . Breast cancer Mother 58  . Hypertension Father   . Prostate  cancer Father   . Kidney failure Father   . Diabetes Father   . Hyperlipidemia Brother   . COPD Paternal Grandfather   . Stroke Maternal Grandfather     Social History   Socioeconomic History  . Marital status: Married    Spouse name: Not on file  . Number of children: Not on file  . Years of education: Not on file  . Highest education level: Not on file  Social Needs  . Financial resource strain: Not on file  . Food insecurity - worry: Not on file  . Food insecurity - inability: Not on file  . Transportation needs - medical: Not on file  . Transportation needs - non-medical: Not on file  Occupational History  . Not on file  Tobacco Use  . Smoking status: Never Smoker  . Smokeless tobacco: Never Used  Substance and Sexual Activity  . Alcohol use: Yes    Alcohol/week: 0.0 oz    Comment: Red wine occasionally  . Drug use: No  . Sexual activity: No    Birth control/protection: Post-menopausal  Other Topics Concern  . Not on file  Social History Narrative  . Not on file    Outpatient Medications Prior to Visit  Medication Sig Dispense Refill  . acetaminophen (TYLENOL) 500 MG tablet Take 1,000 mg by mouth every 6 (six) hours as needed for mild pain.    [name] buPROPion (WELLBUTRIN XL) 150 MG 24 hr tablet TAKE 1 TABLET(150 MG) BY MOUTH DAILY 90 tablet 0  . levothyroxine (SYNTHROID, LEVOTHROID) 137 MCG tablet TAKE 1 TABLET(137 MCG) BY MOUTH DAILY BEFORE BREAKFAST 90 tablet 0  . Multiple Vitamins-Minerals (ICAPS) TABS Take 2 tablets by mouth daily. AREDS 1+2    . Ascorbic Acid (VITAMIN C CR) 1000 MG TBCR Take 1 each by mouth daily. EMERGEN-C PACKET    . cholecalciferol (VITAMIN D) 1000 units tablet Take 1,000 Units by mouth daily.     No facility-administered medications prior to visit.     Allergies  Allergen Reactions  . Phenergan [Promethazine Hcl] Other (See Comments)    Jerking/agitation  . Levofloxacin Nausea And Vomiting  . Pravastatin Sodium Nausea And Vomiting     Review of Systems  Constitutional: Negative for fever and malaise/fatigue.  HENT: Negative for congestion.   Eyes: Negative for blurred vision.  Respiratory: Negative for cough and shortness of breath.   Cardiovascular: Negative for chest pain, palpitations and leg swelling.  Gastrointestinal: Negative for vomiting.  Musculoskeletal: Negative for back pain.  Skin: Negative for rash.  Neurological: Negative for loss of consciousness and headaches.       Numbness in right side of face and lips        Objective:    Physical Exam  Constitutional: She is oriented to person, place, and time. She appears well-developed and well-nourished.  HENT:  Head: Normocephalic and atraumatic.    Eyes: Conjunctivae and EOM are normal.  Neck: Normal range of motion. Neck supple. No JVD present. Carotid bruit is not present. No thyromegaly present.  Cardiovascular: Normal rate, regular rhythm and normal heart sounds.  No murmur heard. Pulmonary/Chest: Effort normal and breath sounds normal. No respiratory distress. She has no wheezes. She has no rales. She exhibits no tenderness.  Musculoskeletal: She exhibits no edema.  Neurological: She is alert and oriented to person, place, and time.  Numbness in R low jaw as shown in picture Good strength in all ext  No other neuro deficits No dec sensation in area noted-- pt only notes it is numb  Psychiatric: She has a normal mood and affect.  Nursing note and vitals reviewed.   BP 126/70 (BP Location: Right Arm, Cuff Size: Normal)   Pulse 63   Temp 97.9 F (36.6 C) (Oral)   Resp 16   Ht 5\' 2"  (1.575 m)   Wt 156 lb 6.4 oz (70.9 kg)   SpO2 98%   BMI 28.61 kg/m  Wt Readings from Last 3 Encounters:  02/17/17 156 lb 6.4 oz (70.9 kg)  08/29/16 150 lb (68 kg)  11/21/15 149 lb 3.2 oz (67.7 kg)   BP Readings from Last 3 Encounters:  02/17/17 126/70  08/29/16 (!) 146/78  11/21/15 128/78     Immunization History  Administered Date(s)  Administered  . Influenza Whole 03/04/2001, 01/12/2009, 04/04/2010  . Influenza, High Dose Seasonal PF 01/20/2014, 12/05/2014, 11/21/2015, 11/05/2016  . Influenza,inj,Quad PF,6+ Mos 01/21/2013  . Pneumococcal Conjugate-13 01/20/2014  . Pneumococcal Polysaccharide-23 07/10/2012  . Td 09/01/1997    Health Maintenance  Topic Date Due  . TETANUS/TDAP  09/02/2007  . INFLUENZA VACCINE  Completed  . DEXA SCAN  Completed  . PNA vac Low Risk Adult  Completed    Lab Results  Component Value  Date   WBC 6.5 10/26/2015   HGB 11.7 (L) 10/26/2015   HCT 35.1 (L) 10/26/2015   PLT 256 10/26/2015   GLUCOSE 77 11/20/2015   CHOL 217 (H) 06/05/2015   TRIG 114.0 06/05/2015   HDL 64.20 06/05/2015   LDLDIRECT 142.8 11/22/2010   LDLCALC 130 (H) 06/05/2015   ALT 13 11/20/2015   AST 15 11/20/2015   NA 142 11/20/2015   K 4.7 11/20/2015   CL 104 11/20/2015   CREATININE 1.13 11/20/2015   BUN 21 11/20/2015   CO2 32 11/20/2015   TSH 0.40 01/07/2017   INR 1.32 11/18/2014   HGBA1C 6.1 (H) 11/18/2014    Lab Results  Component Value Date   TSH 0.40 01/07/2017   Lab Results  Component Value Date   WBC 6.5 10/26/2015   HGB 11.7 (L) 10/26/2015   HCT 35.1 (L) 10/26/2015   MCV 79.2 10/26/2015   PLT 256 10/26/2015   Lab Results  Component Value Date   NA 142 11/20/2015   K 4.7 11/20/2015   CO2 32 11/20/2015   GLUCOSE 77 11/20/2015   BUN 21 11/20/2015   CREATININE 1.13 11/20/2015   BILITOT 0.3 11/20/2015   ALKPHOS 66 11/20/2015   AST 15 11/20/2015   ALT 13 11/20/2015   PROT 6.3 11/20/2015   ALBUMIN 4.0 11/20/2015   CALCIUM 9.2 11/20/2015   ANIONGAP 6 10/26/2015   GFR 49.64 (L) 11/20/2015   Lab Results  Component Value Date   CHOL 217 (H) 06/05/2015   Lab Results  Component Value Date   HDL 64.20 06/05/2015   Lab Results  Component Value Date   LDLCALC 130 (H) 06/05/2015   Lab Results  Component Value Date   TRIG 114.0 06/05/2015   Lab Results  Component Value Date    CHOLHDL 3 06/05/2015   Lab Results  Component Value Date   HGBA1C 6.1 (H) 11/18/2014         Assessment & Plan:   Problem List Items Addressed This Visit      Unprioritized   Facial numbness - Primary    Check MRI If symptoms worsen-- go to ER      Relevant Medications   aspirin EC 81 MG tablet   Other Relevant Orders   MR Brain Wo Contrast   TSH   Vitamin B12   Comprehensive metabolic panel   CBC with Differential/Platelet   Lipid panel   Hypothyroidism   Relevant Orders   TSH    Other Visit Diagnoses    Hyperlipidemia LDL goal <100       Relevant Medications   aspirin EC 81 MG tablet   Other Relevant Orders   Comprehensive metabolic panel   Lipid panel      I have discontinued Analisia C. Muzio's Vitamin C CR and cholecalciferol. I am also having her start on aspirin EC. Additionally, I am having her maintain her ICAPS, acetaminophen, buPROPion, and levothyroxine.  Meds ordered this encounter  Medications  . aspirin EC 81 MG tablet    Sig: Take 1 tablet (81 mg total) by mouth daily.    CMA served as Education administrator during this visit. History, Physical and Plan performed by medical provider. Documentation and orders reviewed and attested to.  [name], DO

## 2017-02-18 LAB — CBC WITH DIFFERENTIAL/PLATELET
BASOS PCT: 0.8 % (ref 0.0–3.0)
Basophils Absolute: 0 10*3/uL (ref 0.0–0.1)
EOS ABS: 0.2 10*3/uL (ref 0.0–0.7)
EOS PCT: 2.6 % (ref 0.0–5.0)
HEMATOCRIT: 38.4 % (ref 36.0–46.0)
HEMOGLOBIN: 12.7 g/dL (ref 12.0–15.0)
LYMPHS PCT: 25.6 % (ref 12.0–46.0)
Lymphs Abs: 1.5 10*3/uL (ref 0.7–4.0)
MCHC: 33 g/dL (ref 30.0–36.0)
MCV: 82.3 fl (ref 78.0–100.0)
MONOS PCT: 9.8 % (ref 3.0–12.0)
Monocytes Absolute: 0.6 10*3/uL (ref 0.1–1.0)
NEUTROS ABS: 3.6 10*3/uL (ref 1.4–7.7)
Neutrophils Relative %: 61.2 % (ref 43.0–77.0)
Platelets: 261 10*3/uL (ref 150.0–400.0)
RBC: 4.66 Mil/uL (ref 3.87–5.11)
RDW: 14.4 % (ref 11.5–15.5)
WBC: 5.9 10*3/uL (ref 4.0–10.5)

## 2017-02-18 LAB — COMPREHENSIVE METABOLIC PANEL
ALBUMIN: 4.2 g/dL (ref 3.5–5.2)
ALT: 15 U/L (ref 0–35)
AST: 17 U/L (ref 0–37)
Alkaline Phosphatase: 58 U/L (ref 39–117)
BUN: 23 mg/dL (ref 6–23)
CALCIUM: 9.4 mg/dL (ref 8.4–10.5)
CHLORIDE: 104 meq/L (ref 96–112)
CO2: 31 meq/L (ref 19–32)
Creatinine, Ser: 1.04 mg/dL (ref 0.40–1.20)
GFR: 54.45 mL/min — AB (ref >60.00)
Glucose, Bld: 125 mg/dL — ABNORMAL HIGH (ref 70–99)
Potassium: 4.9 mEq/L (ref 3.5–5.1)
Sodium: 141 mEq/L (ref 135–145)
Total Bilirubin: 0.3 mg/dL (ref 0.2–1.2)
Total Protein: 6.7 g/dL (ref 6.0–8.3)

## 2017-02-18 LAB — LIPID PANEL
CHOLESTEROL: 186 mg/dL (ref 0–200)
HDL: 60.4 mg/dL (ref >39.00)
LDL Cholesterol: 101 mg/dL — ABNORMAL HIGH (ref 0–99)
NONHDL: 125.34
TRIGLYCERIDES: 122 mg/dL (ref 0.0–149.0)
Total CHOL/HDL Ratio: 3
VLDL: 24.4 mg/dL (ref 0.0–40.0)

## 2017-02-18 LAB — TSH: TSH: 0.12 u[IU]/mL — AB (ref 0.35–4.50)

## 2017-02-18 LAB — VITAMIN B12: Vitamin B-12: 509 pg/mL (ref 211–911)

## 2017-02-24 ENCOUNTER — Other Ambulatory Visit: Payer: Self-pay

## 2017-02-26 ENCOUNTER — Other Ambulatory Visit: Payer: Self-pay

## 2017-02-26 MED ORDER — LEVOTHYROXINE SODIUM 125 MCG PO TABS: 125.0000 ug | ORAL_TABLET | Freq: Every day | ORAL | 0 refills | Status: DC

## 2017-03-03 ENCOUNTER — Other Ambulatory Visit: Payer: Self-pay | Admitting: Family Medicine

## 2017-03-11 ENCOUNTER — Telehealth: Payer: Self-pay | Admitting: Family Medicine

## 2017-03-11 NOTE — Telephone Encounter (Signed)
CRM for notification. See Telephone encounter for:   03/11/17.  Relation to pt: self  Call back number:[phone] Pharmacy: [facility] [place], [phone] (Phone) [phone] (Fax)   Reason for call:  Patient in need of clarity regarding dosage levothyroxine (SYNTHROID, LEVOTHROID) 125 MCG tablet patient states she's been taken 137 MCG for many years and would like to know why it was decreased, please advise

## 2017-03-11 NOTE — Telephone Encounter (Signed)
See lab notes.  Dr. Etter Sjogren decreased.

## 2017-04-11 ENCOUNTER — Telehealth: Payer: Self-pay | Admitting: Family Medicine

## 2017-04-11 NOTE — Telephone Encounter (Signed)
Copied from [place]. Topic: Quick Communication - Rx Refill/Question >> Apr 11, 2017  9:27 AM Arletha Grippe wrote: Medication:levothyroxine (SYNTHROID, LEVOTHROID) 125 MCG tablet    Has the patient contacted their pharmacy? Yes.     (Agent: If no, request that the patient contact the pharmacy for the refill.)   Preferred Pharmacy (with phone number or street name): [facility] [address] - pt says they needed authorization - and the pharm has not heard back , that was on feb 1st.    Agent: Please be advised that RX refills may take up to 3 business days. We ask that you follow-up with your pharmacy.

## 2017-04-12 ENCOUNTER — Other Ambulatory Visit: Payer: Self-pay | Admitting: Family Medicine

## 2017-05-01 ENCOUNTER — Telehealth: Payer: Self-pay | Admitting: *Deleted

## 2017-05-01 NOTE — Telephone Encounter (Signed)
[facility] faxed over a request for levothyroxine.  Patient was suppose to come back in 2 months to recheck tsh.  Left message on machine for patient to call and make a lab only appointment and then base on the results we will send in refill.

## 2017-05-05 ENCOUNTER — Other Ambulatory Visit (INDEPENDENT_AMBULATORY_CARE_PROVIDER_SITE_OTHER): Payer: Medicare Other

## 2017-05-05 DIAGNOSIS — E039 Hypothyroidism, unspecified: Secondary | ICD-10-CM | POA: Diagnosis not present

## 2017-05-05 LAB — TSH: TSH: 1.11 u[IU]/mL (ref 0.35–4.50)

## 2017-05-08 ENCOUNTER — Other Ambulatory Visit: Payer: Self-pay | Admitting: *Deleted

## 2017-05-08 MED ORDER — LEVOTHYROXINE SODIUM 125 MCG PO TABS: ORAL_TABLET | ORAL | 1 refills | Status: DC

## 2017-05-20 ENCOUNTER — Ambulatory Visit (HOSPITAL_BASED_OUTPATIENT_CLINIC_OR_DEPARTMENT_OTHER)
Admission: RE | Admit: 2017-05-20 | Discharge: 2017-05-20 | Disposition: A | Discharge status: Home / Self Care | Payer: Medicare Other | Source: Ambulatory Visit | Attending: Family Medicine | Admitting: Family Medicine

## 2017-05-20 ENCOUNTER — Other Ambulatory Visit: Payer: Self-pay | Admitting: Family Medicine

## 2017-05-20 ENCOUNTER — Ambulatory Visit (INDEPENDENT_AMBULATORY_CARE_PROVIDER_SITE_OTHER): Payer: Medicare Other | Admitting: Family Medicine

## 2017-05-20 ENCOUNTER — Encounter: Payer: Self-pay | Admitting: Family Medicine

## 2017-05-20 VITALS — BP 132/70 | HR 70 | Temp 97.7°F | Resp 16 | Ht 61.81 in | Wt 158.2 lb

## 2017-05-20 DIAGNOSIS — M545 Low back pain, unspecified: Secondary | ICD-10-CM

## 2017-05-20 DIAGNOSIS — M1611 Unilateral primary osteoarthritis, right hip: Secondary | ICD-10-CM | POA: Insufficient documentation

## 2017-05-20 DIAGNOSIS — G8929 Other chronic pain: Secondary | ICD-10-CM

## 2017-05-20 DIAGNOSIS — M25551 Pain in right hip: Secondary | ICD-10-CM

## 2017-05-20 DIAGNOSIS — K219 Gastro-esophageal reflux disease without esophagitis: Secondary | ICD-10-CM

## 2017-05-20 DIAGNOSIS — M47896 Other spondylosis, lumbar region: Secondary | ICD-10-CM | POA: Diagnosis not present

## 2017-05-20 MED ORDER — OMEPRAZOLE 20 MG PO CPDR: 20.0000 mg | DELAYED_RELEASE_CAPSULE | Freq: Every day | ORAL | 3 refills | Status: DC

## 2017-05-20 NOTE — Patient Instructions (Signed)

## 2017-05-20 NOTE — Progress Notes (Signed)
Subjective:  I acted as a Education administrator for [employer]. Yancey Flemings, [place]   Patient ID: Amanda [name], female    DOB: 17-Jun-1940, 77 y.o.   MRN: [id number]  Chief Complaint  Patient presents with  . Hip Pain    off and on for 4-5 months    HPI  Patient is in today for right hip pain.  Her right leg gives way-- occurs several times a pain.  No radiation of pain.  No numbness.  She has not fallen,   No injury.   Patient Care Team: Carollee Herter, Alferd Apa, DO as PCP - General (Family Medicine) Jari Pigg, MD as Consulting Physician (Dermatology) Monna Fam, MD as Consulting Physician (Ophthalmology) Terrance Mass, MD as Consulting Physician (Gynecology)   Past Medical History:  Diagnosis Date  . Constipation, chronic   . GERD (gastroesophageal reflux disease)    zantac  . History of blood transfusion [place]  . Hyperlipidemia   . Hypothyroidism   . Macular degeneration 2013   Both eyes   . Osteopenia   . Pneumonia   . PONV (postoperative nausea and vomiting)    needs little anesthesia  . Shingles   . Shortness of breath    on exertion  . SUI (stress urinary incontinence, female)     Past Surgical History:  Procedure Laterality Date  . BREAST EXCISIONAL BIOPSY Left 1980  . CARPAL TUNNEL RELEASE  1999  . CATARACT EXTRACTION  2009, 2011   BOTH EYES  . [place]  . COLONOSCOPY      Dr Cristina Gong  . DILATION AND CURETTAGE OF UTERUS     X2  . HYSTEROSCOPY W/D&C  01/07/2012   Procedure: DILATATION AND CURETTAGE /HYSTEROSCOPY;  Surgeon: Terrance Mass, MD;  Location: [facility];  Service: Gynecology;  Laterality: N/A;  intrauterine foley catheter for tamponode   . TONSILLECTOMY AND ADENOIDECTOMY    . TUBAL LIGATION     BY LAPAROSCOPY  . WISDOM TOOTH EXTRACTION      Family History  Problem Relation Age of Onset  . Ovarian cancer Mother   . Breast cancer Mother 41  . Hypertension Father   . Prostate cancer Father   . Kidney failure Father     . Diabetes Father   . Hyperlipidemia Brother   . COPD Paternal Grandfather   . Stroke Maternal Grandfather     Social History   Socioeconomic History  . Marital status: Married    Spouse name: Not on file  . Number of children: Not on file  . Years of education: Not on file  . Highest education level: Not on file  Social Needs  . Financial resource strain: Not on file  . Food insecurity - worry: Not on file  . Food insecurity - inability: Not on file  . Transportation needs - medical: Not on file  . Transportation needs - non-medical: Not on file  Occupational History  . Not on file  Tobacco Use  . Smoking status: Never Smoker  . Smokeless tobacco: Never Used  Substance and Sexual Activity  . Alcohol use: Yes    Alcohol/week: 0.0 oz    Comment: Red wine occasionally  . Drug use: No  . Sexual activity: No    Birth control/protection: Post-menopausal  Other Topics Concern  . Not on file  Social History Narrative  . Not on file    Outpatient Medications Prior to Visit  Medication Sig Dispense Refill  .  acetaminophen (TYLENOL) 500 MG tablet Take 1,000 mg by mouth every 6 (six) hours as needed for mild pain.    [name] aspirin EC 81 MG tablet Take 1 tablet (81 mg total) by mouth daily.    [name] buPROPion (WELLBUTRIN XL) 150 MG 24 hr tablet TAKE 1 TABLET BY MOUTH EVERY DAY 90 tablet 0  . levothyroxine (SYNTHROID, LEVOTHROID) 125 MCG tablet TAKE 1 TABLET(125 MCG) BY MOUTH DAILY 90 tablet 1  . Multiple Vitamins-Minerals (ICAPS) TABS Take 2 tablets by mouth daily. AREDS 1+2     No facility-administered medications prior to visit.     Allergies  Allergen Reactions  . Phenergan [Promethazine Hcl] Other (See Comments)    Jerking/agitation  . Levofloxacin Nausea And Vomiting  . Pravastatin Sodium Nausea And Vomiting    Review of Systems  Constitutional: Negative for fever and malaise/fatigue.  HENT: Negative for congestion.   Eyes: Negative for blurred vision.  Respiratory:  Negative for shortness of breath.   Cardiovascular: Negative for chest pain, palpitations and leg swelling.  Gastrointestinal: Negative for abdominal pain, blood in stool and nausea.  Genitourinary: Negative for dysuria and frequency.  Musculoskeletal: Positive for back pain and joint pain. Negative for falls.  Skin: Negative for rash.  Neurological: Negative for dizziness, loss of consciousness and headaches.  Endo/Heme/Allergies: Negative for environmental allergies.  Psychiatric/Behavioral: Negative for depression. The patient is not nervous/anxious.        Objective:    Physical Exam  Constitutional: She is oriented to person, place, and time. She appears well-developed and well-nourished.  HENT:  Head: Normocephalic and atraumatic.  Eyes: Conjunctivae and EOM are normal.  Neck: Normal range of motion. Neck supple. No JVD present. Carotid bruit is not present. No thyromegaly present.  Cardiovascular: Normal rate, regular rhythm and normal heart sounds.  No murmur heard. Pulmonary/Chest: Effort normal and breath sounds normal. No respiratory distress. She has no wheezes. She has no rales. She exhibits no tenderness.  Musculoskeletal: She exhibits tenderness. She exhibits no edema.       Right hip: She exhibits tenderness. She exhibits normal range of motion and normal strength.       Lumbar back: She exhibits tenderness and pain. She exhibits no spasm.  Neurological: She is alert and oriented to person, place, and time. She displays normal reflexes. She exhibits normal muscle tone.  Psychiatric: She has a normal mood and affect.  Nursing note and vitals reviewed.   BP 132/70 (BP Location: Left Arm, Patient Position: Sitting, Cuff Size: Normal)   Pulse 70   Temp 97.7 F (36.5 C) (Oral)   Resp 16   Ht 5' 1.81" (1.57 m)   Wt 158 lb 3.2 oz (71.8 kg)   SpO2 96%   BMI 29.11 kg/m  Wt Readings from Last 3 Encounters:  05/20/17 158 lb 3.2 oz (71.8 kg)  02/17/17 156 lb 6.4 oz  (70.9 kg)  08/29/16 150 lb (68 kg)   BP Readings from Last 3 Encounters:  05/20/17 132/70  02/17/17 126/70  08/29/16 (!) 146/78     Immunization History  Administered Date(s) Administered  . Influenza Whole 03/04/2001, 01/12/2009, 04/04/2010  . Influenza, High Dose Seasonal PF 01/20/2014, 12/05/2014, 11/21/2015, 11/05/2016  . Influenza,inj,Quad PF,6+ Mos 01/21/2013  . Pneumococcal Conjugate-13 01/20/2014  . Pneumococcal Polysaccharide-23 07/10/2012  . Td 09/01/1997    Health Maintenance  Topic Date Due  . TETANUS/TDAP  09/02/2007  . INFLUENZA VACCINE  Completed  . DEXA SCAN  Completed  . PNA vac Low  Risk Adult  Completed    Lab Results  Component Value Date   WBC 5.9 02/17/2017   HGB 12.7 02/17/2017   HCT 38.4 02/17/2017   PLT 261.0 02/17/2017   GLUCOSE 125 (H) 02/17/2017   CHOL 186 02/17/2017   TRIG 122.0 02/17/2017   HDL 60.40 02/17/2017   LDLDIRECT 142.8 11/22/2010   LDLCALC 101 (H) 02/17/2017   ALT 15 02/17/2017   AST 17 02/17/2017   NA 141 02/17/2017   K 4.9 02/17/2017   CL 104 02/17/2017   CREATININE 1.04 02/17/2017   BUN 23 02/17/2017   CO2 31 02/17/2017   TSH 1.11 05/05/2017   INR 1.32 11/18/2014   HGBA1C 6.1 (H) 11/18/2014    Lab Results  Component Value Date   TSH 1.11 05/05/2017   Lab Results  Component Value Date   WBC 5.9 02/17/2017   HGB 12.7 02/17/2017   HCT 38.4 02/17/2017   MCV 82.3 02/17/2017   PLT 261.0 02/17/2017   Lab Results  Component Value Date   NA 141 02/17/2017   K 4.9 02/17/2017   CO2 31 02/17/2017   GLUCOSE 125 (H) 02/17/2017   BUN 23 02/17/2017   CREATININE 1.04 02/17/2017   BILITOT 0.3 02/17/2017   ALKPHOS 58 02/17/2017   AST 17 02/17/2017   ALT 15 02/17/2017   PROT 6.7 02/17/2017   ALBUMIN 4.2 02/17/2017   CALCIUM 9.4 02/17/2017   ANIONGAP 6 10/26/2015   GFR 54.45 (L) 02/17/2017   Lab Results  Component Value Date   CHOL 186 02/17/2017   Lab Results  Component Value Date   HDL 60.40 02/17/2017    Lab Results  Component Value Date   LDLCALC 101 (H) 02/17/2017   Lab Results  Component Value Date   TRIG 122.0 02/17/2017   Lab Results  Component Value Date   CHOLHDL 3 02/17/2017   Lab Results  Component Value Date   HGBA1C 6.1 (H) 11/18/2014         Assessment & Plan:   Problem List Items Addressed This Visit      Unprioritized   GERD (gastroesophageal reflux disease)   Relevant Medications   omeprazole (PRILOSEC) 20 MG capsule   Other Relevant Orders   Ambulatory referral to Gastroenterology    Other Visit Diagnoses    Chronic midline low back pain without sciatica    -  Primary   Relevant Orders   DG Lumbar Spine Complete (Completed)   Right hip pain       Relevant Orders   DG Lumbar Spine Complete (Completed)      I am having Amanda [name] start on omeprazole. I am also having her maintain her ICAPS, acetaminophen, aspirin EC, buPROPion, and levothyroxine.  Meds ordered this encounter  Medications  . omeprazole (PRILOSEC) 20 MG capsule    Sig: Take 1 capsule (20 mg total) by mouth daily.    Dispense:  30 capsule    Refill:  3    CMA served as scribe during this visit. History, Physical and Plan performed by medical provider. Documentation and orders reviewed and attested to.  [name], DO

## 2017-05-31 ENCOUNTER — Other Ambulatory Visit: Payer: Self-pay | Admitting: Family Medicine

## 2017-06-26 DIAGNOSIS — H40013 Open angle with borderline findings, low risk, bilateral: Secondary | ICD-10-CM | POA: Diagnosis not present

## 2017-06-26 DIAGNOSIS — H1851 Endothelial corneal dystrophy: Secondary | ICD-10-CM | POA: Diagnosis not present

## 2017-06-26 DIAGNOSIS — H35033 Hypertensive retinopathy, bilateral: Secondary | ICD-10-CM | POA: Diagnosis not present

## 2017-06-26 DIAGNOSIS — H353132 Nonexudative age-related macular degeneration, bilateral, intermediate dry stage: Secondary | ICD-10-CM | POA: Diagnosis not present

## 2017-06-30 DIAGNOSIS — K219 Gastro-esophageal reflux disease without esophagitis: Secondary | ICD-10-CM | POA: Diagnosis not present

## 2017-07-15 ENCOUNTER — Telehealth: Payer: Self-pay | Admitting: *Deleted

## 2017-07-15 NOTE — Telephone Encounter (Signed)
Copied from [place] [id number]. Topic: General - Call Back - No Documentation >> Jul 15, 2017 10:35 AM Ahmed Prima L wrote: Reason for CRM: Patient said she just missed a call from the office. Please call back if needed [phone]

## 2017-07-15 NOTE — Telephone Encounter (Signed)
Spoke with pt the missed call was for her husband change in appointment time for today

## 2017-07-16 ENCOUNTER — Telehealth: Payer: Self-pay | Admitting: Family Medicine

## 2017-07-16 NOTE — Telephone Encounter (Signed)
Copied from [place] [phone]. Topic: Inquiry >> Jul 16, 2017  1:37 PM Scherrie Gerlach wrote: Reason for CRM: Erline Levine with [facility] in the geriatric dept calling to get orders to stop pt's aspirin for 4 days. Pt is to participate in a somma study. Please call stacey at [phone]

## 2017-07-17 NOTE — Telephone Encounter (Signed)
Ok to stop for 4 days

## 2017-07-17 NOTE — Telephone Encounter (Signed)
Called Stacey back no answer left detailed voicemail on orders

## 2017-07-18 ENCOUNTER — Telehealth: Payer: Self-pay | Admitting: Family Medicine

## 2017-07-18 DIAGNOSIS — H6091 Unspecified otitis externa, right ear: Secondary | ICD-10-CM | POA: Diagnosis not present

## 2017-07-18 NOTE — Telephone Encounter (Signed)
Copied from [place] [phone]. Topic: Quick Communication - Rx Refill/Question >> Jul 18, 2017 11:38 AM Amanda [name] wrote: Pt called [name]/c she is out of town in [place], [place] and she went in to an [facility] and she was given some ear drops that are over $300, she was calling her pcp office as a back up plan to see if she could be given medication for an ear infection, pt was told to call [facility] to have physician that was seen to call in another medication that is more economic but pt wanted message sent to see what pcp can do, call pt to advise

## 2017-07-18 NOTE — Telephone Encounter (Signed)
Patient states she figured something else out to please disregard this message.

## 2017-07-22 ENCOUNTER — Encounter: Payer: Self-pay | Admitting: Family Medicine

## 2017-07-22 ENCOUNTER — Ambulatory Visit (INDEPENDENT_AMBULATORY_CARE_PROVIDER_SITE_OTHER): Payer: Medicare Other | Admitting: Family Medicine

## 2017-07-22 VITALS — BP 138/69 | HR 69 | Temp 98.7°F | Resp 14 | Ht 62.0 in | Wt 156.0 lb

## 2017-07-22 DIAGNOSIS — H6501 Acute serous otitis media, right ear: Secondary | ICD-10-CM

## 2017-07-22 MED ORDER — PROMETHAZINE-DM 6.25-15 MG/5ML PO SYRP: 5.0000 mL | ORAL_SOLUTION | Freq: Four times a day (QID) | ORAL | 0 refills | Status: DC | PRN

## 2017-07-22 MED ORDER — CEFTRIAXONE SODIUM 1 G IJ SOLR
1.0000 g | Freq: Once | INTRAMUSCULAR | Status: AC
  Administered 2017-07-22: 1 g via INTRAMUSCULAR

## 2017-07-22 MED ORDER — AMOXICILLIN-POT CLAVULANATE 875-125 MG PO TABS: 1.0000 | ORAL_TABLET | Freq: Two times a day (BID) | ORAL | 0 refills | Status: DC

## 2017-07-22 NOTE — Patient Instructions (Signed)

## 2017-07-22 NOTE — Progress Notes (Signed)
Subjective:  Amanda acted as a Education administrator for [employer]. Yancey Flemings, [place]   Patient ID: Amanda [name], female    DOB: 1940-08-18, 77 y.o.   MRN: [id number]  Chief Complaint  Patient presents with  . Ear Pain    HPI  Patient is in today for ear pain.  Pt was seen in [facility] and given floxin drops for ear.  She is a little better but cant hear out of that ear.   No fever  + congestion  Patient Care Team: Carollee Herter, Alferd Apa, DO as PCP - General (Family Medicine) Jari Pigg, MD as Consulting Physician (Dermatology) Monna Fam, MD as Consulting Physician (Ophthalmology) Terrance Mass, MD as Consulting Physician (Gynecology)   Past Medical History:  Diagnosis Date  . Constipation, chronic   . GERD (gastroesophageal reflux disease)    zantac  . History of blood transfusion [place]  . Hyperlipidemia   . Hypothyroidism   . Macular degeneration 2013   Both eyes   . Osteopenia   . Pneumonia   . PONV (postoperative nausea and vomiting)    needs little anesthesia  . Shingles   . Shortness of breath    on exertion  . SUI (stress urinary incontinence, female)     Past Surgical History:  Procedure Laterality Date  . BREAST EXCISIONAL BIOPSY Left 1980  . CARPAL TUNNEL RELEASE  1999  . CATARACT EXTRACTION  2009, 2011   BOTH EYES  . [place]  . COLONOSCOPY      Dr Cristina Gong  . DILATION AND CURETTAGE OF UTERUS     X2  . HYSTEROSCOPY W/D&C  01/07/2012   Procedure: DILATATION AND CURETTAGE /HYSTEROSCOPY;  Surgeon: Terrance Mass, MD;  Location: [facility];  Service: Gynecology;  Laterality: N/A;  intrauterine foley catheter for tamponode   . TONSILLECTOMY AND ADENOIDECTOMY    . TUBAL LIGATION     BY LAPAROSCOPY  . WISDOM TOOTH EXTRACTION      Family History  Problem Relation Age of Onset  . Ovarian cancer Mother   . Breast cancer Mother 14  . Hypertension Father   . Prostate cancer Father   . Kidney failure Father   . Diabetes Father   .  Hyperlipidemia Brother   . COPD Paternal Grandfather   . Stroke Maternal Grandfather     Social History   Socioeconomic History  . Marital status: Married    Spouse name: Not on file  . Number of children: Not on file  . Years of education: Not on file  . Highest education level: Not on file  Occupational History  . Not on file  Social Needs  . Financial resource strain: Not on file  . Food insecurity:    Worry: Not on file    Inability: Not on file  . Transportation needs:    Medical: Not on file    Non-medical: Not on file  Tobacco Use  . Smoking status: Never Smoker  . Smokeless tobacco: Never Used  Substance and Sexual Activity  . Alcohol use: Yes    Alcohol/week: 0.0 oz    Comment: Red wine occasionally  . Drug use: No  . Sexual activity: Never    Birth control/protection: Post-menopausal  Lifestyle  . Physical activity:    Days per week: Not on file    Minutes per session: Not on file  . Stress: Not on file  Relationships  . Social connections:    Talks on  phone: Not on file    Gets together: Not on file    Attends religious service: Not on file    Active member of club or organization: Not on file    Attends meetings of clubs or organizations: Not on file    Relationship status: Not on file  . Intimate partner violence:    Fear of current or ex partner: Not on file    Emotionally abused: Not on file    Physically abused: Not on file    Forced sexual activity: Not on file  Other Topics Concern  . Not on file  Social History Narrative  . Not on file    Outpatient Medications Prior to Visit  Medication Sig Dispense Refill  . acetaminophen (TYLENOL) 500 MG tablet Take 1,000 mg by mouth every 6 (six) hours as needed for mild pain.    [name] aspirin EC 81 MG tablet Take 1 tablet (81 mg total) by mouth daily.    [name] buPROPion (WELLBUTRIN XL) 150 MG 24 hr tablet TAKE 1 TABLET BY MOUTH EVERY DAY 90 tablet 0  . levothyroxine (SYNTHROID, LEVOTHROID) 125 MCG tablet  TAKE 1 TABLET(125 MCG) BY MOUTH DAILY 90 tablet 1  . Multiple Vitamins-Minerals (ICAPS) TABS Take 2 tablets by mouth daily. AREDS 1+2    . omeprazole (PRILOSEC) 20 MG capsule Take 1 capsule (20 mg total) by mouth daily. 30 capsule 3   No facility-administered medications prior to visit.     Allergies  Allergen Reactions  . Phenergan [Promethazine Hcl] Other (See Comments)    Jerking/agitation  . Levofloxacin Nausea And Vomiting  . Pravastatin Sodium Nausea And Vomiting    Review of Systems  Constitutional: Negative for chills, fever and malaise/fatigue.  HENT: Positive for congestion and ear pain. Negative for hearing loss.   Eyes: Negative for discharge.  Respiratory: Negative for cough, sputum production and shortness of breath.   Cardiovascular: Negative for chest pain, palpitations and leg swelling.  Gastrointestinal: Negative for abdominal pain, blood in stool, constipation, diarrhea, heartburn, nausea and vomiting.  Genitourinary: Negative for dysuria, frequency, hematuria and urgency.  Musculoskeletal: Negative for back pain, falls and myalgias.  Skin: Negative for rash.  Neurological: Negative for dizziness, sensory change, loss of consciousness, weakness and headaches.  Endo/Heme/Allergies: Negative for environmental allergies. Does not bruise/bleed easily.  Psychiatric/Behavioral: Negative for depression and suicidal ideas. The patient is not nervous/anxious and does not have insomnia.        Objective:    Physical Exam  Constitutional: She is oriented to person, place, and time. She appears well-developed and well-nourished.  HENT:  Head: Normocephalic and atraumatic.  Right Ear: There is tenderness. Tympanic membrane is injected and erythematous. Decreased hearing is noted.  Left Ear: Hearing, tympanic membrane, external ear and ear canal normal.  Eyes: Conjunctivae and EOM are normal.  Neck: Normal range of motion. Neck supple. No JVD present. Carotid bruit is not  present. No thyromegaly present.  Cardiovascular: Normal rate, regular rhythm and normal heart sounds.  No murmur heard. Pulmonary/Chest: Effort normal and breath sounds normal. No respiratory distress. She has no wheezes. She has no rales. She exhibits no tenderness.  Musculoskeletal: She exhibits no edema.  Neurological: She is alert and oriented to person, place, and time.  Psychiatric: She has a normal mood and affect.  Nursing note and vitals reviewed.   BP 138/69 (BP Location: Left Arm, Patient Position: Sitting, Cuff Size: Large)   Pulse 69   Temp 98.7 F (37.1 C) (Oral)  Resp 14   Ht 5\' 2"  (1.575 m)   Wt 156 lb (70.8 kg)   SpO2 97%   BMI 28.53 kg/m  Wt Readings from Last 3 Encounters:  07/22/17 156 lb (70.8 kg)  05/20/17 158 lb 3.2 oz (71.8 kg)  02/17/17 156 lb 6.4 oz (70.9 kg)   BP Readings from Last 3 Encounters:  07/22/17 138/69  05/20/17 132/70  02/17/17 126/70     Immunization History  Administered Date(s) Administered  . Influenza Whole 03/04/2001, 01/12/2009, 04/04/2010  . Influenza, High Dose Seasonal PF 01/20/2014, 12/05/2014, 11/21/2015, 11/05/2016  . Influenza,inj,Quad PF,6+ Mos 01/21/2013  . Pneumococcal Conjugate-13 01/20/2014  . Pneumococcal Polysaccharide-23 07/10/2012  . Td 09/01/1997    Health Maintenance  Topic Date Due  . TETANUS/TDAP  09/02/2007  . INFLUENZA VACCINE  10/02/2017  . DEXA SCAN  Completed  . PNA vac Low Risk Adult  Completed    Lab Results  Component Value Date   WBC 5.9 02/17/2017   HGB 12.7 02/17/2017   HCT 38.4 02/17/2017   PLT 261.0 02/17/2017   GLUCOSE 125 (H) 02/17/2017   CHOL 186 02/17/2017   TRIG 122.0 02/17/2017   HDL 60.40 02/17/2017   LDLDIRECT 142.8 11/22/2010   LDLCALC 101 (H) 02/17/2017   ALT 15 02/17/2017   AST 17 02/17/2017   NA 141 02/17/2017   K 4.9 02/17/2017   CL 104 02/17/2017   CREATININE 1.04 02/17/2017   BUN 23 02/17/2017   CO2 31 02/17/2017   TSH 1.11 05/05/2017   INR 1.32  11/18/2014   HGBA1C 6.1 (H) 11/18/2014    Lab Results  Component Value Date   TSH 1.11 05/05/2017   Lab Results  Component Value Date   WBC 5.9 02/17/2017   HGB 12.7 02/17/2017   HCT 38.4 02/17/2017   MCV 82.3 02/17/2017   PLT 261.0 02/17/2017   Lab Results  Component Value Date   NA 141 02/17/2017   K 4.9 02/17/2017   CO2 31 02/17/2017   GLUCOSE 125 (H) 02/17/2017   BUN 23 02/17/2017   CREATININE 1.04 02/17/2017   BILITOT 0.3 02/17/2017   ALKPHOS 58 02/17/2017   AST 17 02/17/2017   ALT 15 02/17/2017   PROT 6.7 02/17/2017   ALBUMIN 4.2 02/17/2017   CALCIUM 9.4 02/17/2017   ANIONGAP 6 10/26/2015   GFR 54.45 (L) 02/17/2017   Lab Results  Component Value Date   CHOL 186 02/17/2017   Lab Results  Component Value Date   HDL 60.40 02/17/2017   Lab Results  Component Value Date   LDLCALC 101 (H) 02/17/2017   Lab Results  Component Value Date   TRIG 122.0 02/17/2017   Lab Results  Component Value Date   CHOLHDL 3 02/17/2017   Lab Results  Component Value Date   HGBA1C 6.1 (H) 11/18/2014         Assessment & Plan:   Problem List Items Addressed This Visit    None    Visit Diagnoses    Non-recurrent acute serous otitis media of right ear    -  Primary   Relevant Medications   amoxicillin-clavulanate (AUGMENTIN) 875-125 MG tablet   cefTRIAXone (ROCEPHIN) injection 1 g (Start on 07/22/2017  4:30 PM)    con't ear drops rto prn   Amanda [name] start on amoxicillin-clavulanate. Amanda am also having her maintain her ICAPS, acetaminophen, aspirin EC, levothyroxine, omeprazole, and buPROPion. We will continue to administer cefTRIAXone.  Meds ordered this encounter  Medications  .  amoxicillin-clavulanate (AUGMENTIN) 875-125 MG tablet    Sig: Take 1 tablet by mouth 2 (two) times daily.    Dispense:  20 tablet    Refill:  0  . cefTRIAXone (ROCEPHIN) injection 1 g    CMA served as scribe during this visit. History, Physical and Plan performed by  medical provider. Documentation and orders reviewed and attested to.  [name], DO

## 2017-07-22 NOTE — Addendum Note (Signed)
Addended by: Roma Schanz R on: 07/22/2017 04:32 PM   Modules accepted: Orders

## 2017-07-25 ENCOUNTER — Other Ambulatory Visit: Payer: Self-pay | Admitting: Family Medicine

## 2017-07-25 ENCOUNTER — Encounter: Payer: Self-pay | Admitting: Family Medicine

## 2017-07-25 ENCOUNTER — Ambulatory Visit: Payer: Self-pay

## 2017-07-25 DIAGNOSIS — B370 Candidal stomatitis: Secondary | ICD-10-CM

## 2017-07-25 DIAGNOSIS — H6690 Otitis media, unspecified, unspecified ear: Secondary | ICD-10-CM

## 2017-07-25 MED ORDER — CEFDINIR 300 MG PO CAPS: 300.0000 mg | ORAL_CAPSULE | Freq: Two times a day (BID) | ORAL | 0 refills | Status: DC

## 2017-07-25 MED ORDER — NYSTATIN 100000 UNIT/ML MT SUSP: 5.0000 mL | Freq: Four times a day (QID) | OROMUCOSAL | 0 refills | Status: DC

## 2017-07-25 NOTE — Telephone Encounter (Signed)
Author phoned pt. to relay Dr. Nonda Lou message to discontinue augmentin, and start omnicef instead, with nystatin swish and spit for possible candidiasis. Administration instructions spelled out over VM as pt. did not answer, and neither did husband at his phone [phone]. Call back number of [phone] and [phone] given if any questions.

## 2017-07-25 NOTE — Telephone Encounter (Signed)
D/c augmentin  omnicef sent to pharmacy---- nystatin swish and spit sent as well

## 2017-07-25 NOTE — Telephone Encounter (Signed)
Pt called to report possible medication reaction to Augmentin. Med was prescribed 07/22/17 for ear infection. Pt c/o upset stomach and beefy red tongue. Pt stated that her tongue is mildly swollen and is sore. Pt states it hurts when she eat on the top of her tongue. Denies difficulty breathing, wheezing, rashes or edema. Agnes Lawrence at PCP office and she stated to route triage call back to office routine priority. Reason for Disposition . Pharmacy calling with prescription questions and triager unable to answer question  Answer Assessment - Initial Assessment Questions 1. SYMPTOMS: "Do you have any symptoms?" Stomach is upset and tongue is beefy red and sore on the top of tongue and swollen that is interfering with eating due to the soreness. No wheezing, rash, no edema to face or other parts of the body. Was prescribed Augmentin on 07/22/17 for ear infection.  2. SEVERITY: If symptoms are present, ask "Are they mild, moderate or severe?"     mild  Protocols used: MEDICATION QUESTION CALL-A-AH

## 2017-07-25 NOTE — Telephone Encounter (Signed)
Author spoke with Dr. Etter Sjogren re: beefy red painful tongue and GI upset since Augmentin prescribed on 5/21, and Dr. Etter Sjogren consulted. Dr. Etter Sjogren asked Pryor Curia to route history to her so she can potentially change medication. Author will follow-up with patient when plan known.

## 2017-07-30 DIAGNOSIS — H9313 Tinnitus, bilateral: Secondary | ICD-10-CM | POA: Diagnosis not present

## 2017-07-30 DIAGNOSIS — J040 Acute laryngitis: Secondary | ICD-10-CM | POA: Diagnosis not present

## 2017-07-30 DIAGNOSIS — J320 Chronic maxillary sinusitis: Secondary | ICD-10-CM | POA: Diagnosis not present

## 2017-07-30 DIAGNOSIS — H6523 Chronic serous otitis media, bilateral: Secondary | ICD-10-CM | POA: Diagnosis not present

## 2017-07-30 DIAGNOSIS — H7291 Unspecified perforation of tympanic membrane, right ear: Secondary | ICD-10-CM | POA: Diagnosis not present

## 2017-07-30 DIAGNOSIS — J322 Chronic ethmoidal sinusitis: Secondary | ICD-10-CM | POA: Diagnosis not present

## 2017-08-06 DIAGNOSIS — J301 Allergic rhinitis due to pollen: Secondary | ICD-10-CM | POA: Diagnosis not present

## 2017-08-06 DIAGNOSIS — H6522 Chronic serous otitis media, left ear: Secondary | ICD-10-CM | POA: Diagnosis not present

## 2017-08-29 ENCOUNTER — Other Ambulatory Visit: Payer: Self-pay | Admitting: Family Medicine

## 2017-09-01 ENCOUNTER — Ambulatory Visit: Payer: Medicare Other | Admitting: *Deleted

## 2017-09-03 NOTE — Progress Notes (Addendum)
Subjective:   Amanda [name] is a 77 y.o. female who presents for Medicare Annual (Subsequent) preventive examination.  Pt has enrolled for volunteer program at [facility]  Review of Systems: No ROS.  Medicare Wellness Visit. Additional risk factors are reflected in the social history. Cardiac Risk Factors include: none;dyslipidemia Sleep patterns: Sleeps 8 hrs. Feels rested. Home Safety/Smoke Alarms: Feels safe in home. Smoke alarms in place.  Living environment; residence and Firearm Safety: Lives with husband in 1 story home. Seat Belt Safety/Bike Helmet: Wears seat belt.   Female:      Mammo-  Follows with Dr.Lavou. Scheduled 09/30/17 Dexa scan- ordered        [facility]- last 05/06/12 Eye- Dr.Hecker every 6 months for Macular degeneration    Objective:     Vitals: BP 130/62 (BP Location: Left Arm, Patient Position: Sitting, Cuff Size: Normal)   Pulse 64   Ht 5\' 2"  (1.575 m)   Wt 154 lb 12.8 oz (70.2 kg)   SpO2 97%   BMI 28.31 kg/m   Body mass index is 28.31 kg/m.  Advanced Directives 09/08/2017 08/29/2016 10/26/2015 11/17/2014 11/17/2014 11/15/2014 07/02/2012  Does Patient Have a Medical Advance Directive? Yes Yes Yes Yes Yes No Patient has advance directive, copy not in chart  Type of Advance Directive [place];Living will [place];Living will - [place];Living will [place];Living will - [place];Living will  Does patient want to make changes to medical advance directive? No - Patient declined - - - - - -  Copy of [place] in Chart? Yes No - copy requested - - - - -  Would patient like information on creating a medical advance directive? - - - - - No - patient declined information -    Tobacco Social History   Tobacco Use  Smoking Status Never Smoker  Smokeless Tobacco Never Used     Counseling given: Not Answered   Clinical Intake: Pain : No/denies pain      Past Medical History:  Diagnosis Date  . Constipation, chronic   . GERD (gastroesophageal reflux disease)    zantac  . History of blood transfusion [place]  . Hyperlipidemia   . Hypothyroidism   . Macular degeneration 2013   Both eyes   . Osteopenia   . Pneumonia   . PONV (postoperative nausea and vomiting)    needs little anesthesia  . Shingles   . Shortness of breath    on exertion  . SUI (stress urinary incontinence, female)    Past Surgical History:  Procedure Laterality Date  . BREAST EXCISIONAL BIOPSY Left 1980  . CARPAL TUNNEL RELEASE  1999  . CATARACT EXTRACTION  2009, 2011   BOTH EYES  . [place]  . COLONOSCOPY      Dr Cristina Gong  . DILATION AND CURETTAGE OF UTERUS     X2  . HYSTEROSCOPY W/D&C  01/07/2012   Procedure: DILATATION AND CURETTAGE /HYSTEROSCOPY;  Surgeon: Terrance Mass, MD;  Location: [facility];  Service: Gynecology;  Laterality: N/A;  intrauterine foley catheter for tamponode   . TONSILLECTOMY AND ADENOIDECTOMY    . TUBAL LIGATION     BY LAPAROSCOPY  . WISDOM TOOTH EXTRACTION     Family History  Problem Relation Age of Onset  . Ovarian cancer Mother   . Breast cancer Mother 48  . Hypertension Father   . Prostate cancer Father   .  Kidney failure Father   . Diabetes Father   . Hyperlipidemia Brother   . COPD Paternal Grandfather   . Stroke Maternal Grandfather    Social History   Socioeconomic History  . Marital status: Married    Spouse name: Not on file  . Number of children: Not on file  . Years of education: Not on file  . Highest education level: Not on file  Occupational History  . Not on file  Social Needs  . Financial resource strain: Not on file  . Food insecurity:    Worry: Not on file    Inability: Not on file  . Transportation needs:    Medical: Not on file    Non-medical: Not on file  Tobacco Use  . Smoking status: Never Smoker  . Smokeless tobacco: Never Used  Substance and Sexual  Activity  . Alcohol use: Yes    Alcohol/week: 0.0 oz    Comment: Red wine occasionally  . Drug use: No  . Sexual activity: Never    Birth control/protection: Post-menopausal  Lifestyle  . Physical activity:    Days per week: Not on file    Minutes per session: Not on file  . Stress: Not on file  Relationships  . Social connections:    Talks on phone: Not on file    Gets together: Not on file    Attends religious service: Not on file    Active member of club or organization: Not on file    Attends meetings of clubs or organizations: Not on file    Relationship status: Not on file  Other Topics Concern  . Not on file  Social History Narrative  . Not on file    Outpatient Encounter Medications as of 09/08/2017  Medication Sig  . acetaminophen (TYLENOL) 500 MG tablet Take 1,000 mg by mouth every 6 (six) hours as needed for mild pain.  [name] aspirin EC 81 MG tablet Take 1 tablet (81 mg total) by mouth daily.  [name] levothyroxine (SYNTHROID, LEVOTHROID) 125 MCG tablet TAKE 1 TABLET(125 MCG) BY MOUTH DAILY  . Multiple Vitamins-Minerals (ICAPS) TABS Take 2 tablets by mouth daily. AREDS 1+2  . omeprazole (PRILOSEC) 20 MG capsule Take 1 capsule (20 mg total) by mouth daily.  . cefdinir (OMNICEF) 300 MG capsule Take 1 capsule (300 mg total) by mouth 2 (two) times daily.  . [DISCONTINUED] buPROPion (WELLBUTRIN XL) 150 MG 24 hr tablet TAKE 1 TABLET BY MOUTH EVERY DAY  . [DISCONTINUED] nystatin (MYCOSTATIN) 100000 UNIT/ML suspension Take 5 mLs (500,000 Units total) by mouth 4 (four) times daily. Swish and spit  . [DISCONTINUED] promethazine-dextromethorphan (PROMETHAZINE-DM) 6.25-15 MG/5ML syrup Take 5 mLs by mouth 4 (four) times daily as needed.   No facility-administered encounter medications on file as of 09/08/2017.     Activities of Daily Living In your present state of health, do you have any difficulty performing the following activities: 09/08/2017  Hearing? N  Vision? N  Difficulty  concentrating or making decisions? N  Walking or climbing stairs? N  Dressing or bathing? N  Doing errands, shopping? N  Preparing Food and eating ? N  Using the Toilet? N  In the past six months, have you accidently leaked urine? N  Do you have problems with loss of bowel control? N  Managing your Medications? N  Managing your Finances? N  Housekeeping or managing your Housekeeping? N  Some recent data might be hidden    Patient Care Team: Carollee Herter, Alferd Apa,  DO as PCP - General (Family Medicine) Jari Pigg, MD as Consulting Physician (Dermatology) Monna Fam, MD as Consulting Physician (Ophthalmology) Terrance Mass, MD as Consulting Physician (Gynecology)    Assessment:   This is a routine wellness examination for Amanda [name]. Physical assessment deferred to PCP.  Exercise Activities and Dietary recommendations Current Exercise Habits: Home exercise routine(walks the dog. ), Time (Minutes): 30, Frequency (Times/Week): 7, Weekly Exercise (Minutes/Week): 210, Intensity: Mild, Exercise limited by: None identified Diet (meal preparation, eat out, water intake, caffeinated beverages, dairy products, fruits and vegetables): in general, a "healthy" diet  , well balanced   Goals    . practice clarinet       Fall Risk Fall Risk  09/08/2017 08/29/2016 02/09/2016 12/05/2014 08/26/2013  Falls in the past year? No No No No No  Comment - - Emmi Telephone Survey: data to providers prior to load - -   Depression Screen PHQ 2/9 Scores 09/08/2017 08/29/2016 12/05/2014 08/26/2013  PHQ - 2 Score 0 0 0 0     Cognitive Function Ad8 score reviewed for issues:  Issues making decisions:no  Less interest in hobbies / activities:no  Repeats questions, stories (family complaining):no  Trouble using ordinary gadgets (microwave, computer, phone):no  Forgets the month or year: no  Mismanaging finances: no  Remembering appts:no  Daily problems with thinking and/or memory:no Ad8 score  is=0     MMSE - Mini Mental State Exam 08/29/2016  Orientation to time 5  Orientation to Place 5  Registration 3  Attention/ Calculation 5  Recall 3  Language- name 2 objects 2  Language- repeat 1  Language- follow 3 step command 3  Language- read & follow direction 1  Write a sentence 1  Copy design 1  Total score 30        Immunization History  Administered Date(s) Administered  . Influenza Whole 03/04/2001, 01/12/2009, 04/04/2010  . Influenza, High Dose Seasonal PF 01/20/2014, 12/05/2014, 11/21/2015, 11/05/2016  . Influenza,inj,Quad PF,6+ Mos 01/21/2013  . Pneumococcal Conjugate-13 01/20/2014  . Pneumococcal Polysaccharide-23 07/10/2012  . Td 09/01/1997    Screening Tests Health Maintenance  Topic Date Due  . TETANUS/TDAP  09/02/2007  . INFLUENZA VACCINE  10/02/2017  . DEXA SCAN  Completed  . PNA vac Low Risk Adult  Completed         Plan:    Please schedule your next medicare wellness visit with me in 1 yr.  Continue to eat heart healthy diet (full of fruits, vegetables, whole grains, lean protein, water--limit salt, fat, and sugar intake) and increase physical activity as tolerated.  Continue doing brain stimulating activities (puzzles, reading, adult coloring books, staying active) to keep memory sharp.   I have ordered your bone density scan. Please schedule.  I have personally reviewed and noted the following in the patient's chart:   . Medical and social history . Use of alcohol, tobacco or illicit drugs  . Current medications and supplements . Functional ability and status . Nutritional status . Physical activity . Advanced directives . List of other physicians . Hospitalizations, surgeries, and ER visits in previous 12 months . Vitals . Screenings to include cognitive, depression, and falls . Referrals and appointments  In addition, I have reviewed and discussed with patient certain preventive protocols, quality metrics, and best practice  recommendations. A written personalized care plan for preventive services as well as general preventive health recommendations were provided to patient.     Amanda [name], [place]  09/08/2017  Reviewed  [name], DO

## 2017-09-08 ENCOUNTER — Ambulatory Visit (INDEPENDENT_AMBULATORY_CARE_PROVIDER_SITE_OTHER): Payer: Medicare Other | Admitting: *Deleted

## 2017-09-08 ENCOUNTER — Encounter: Payer: Self-pay | Admitting: *Deleted

## 2017-09-08 VITALS — BP 130/62 | HR 64 | Ht 62.0 in | Wt 154.8 lb

## 2017-09-08 DIAGNOSIS — Z0000 Encounter for general adult medical examination without abnormal findings: Secondary | ICD-10-CM

## 2017-09-08 DIAGNOSIS — Z780 Asymptomatic menopausal state: Secondary | ICD-10-CM

## 2017-09-08 NOTE — Patient Instructions (Signed)
Please schedule your next medicare wellness visit with me in 1 yr.  Continue to eat heart healthy diet (full of fruits, vegetables, whole grains, lean protein, water--limit salt, fat, and sugar intake) and increase physical activity as tolerated.  Continue doing brain stimulating activities (puzzles, reading, adult coloring books, staying active) to keep memory sharp.   I have ordered your bone density scan. Please schedule.   Amanda [name] , Thank you for taking time to come for your Medicare Wellness Visit. I appreciate your ongoing commitment to your health goals. Please review the following plan we discussed and let me know if I can assist you in the future.   These are the goals we discussed: Goals    . practice clarinet       This is a list of the screening recommended for you and due dates:  Health Maintenance  Topic Date Due  . Tetanus Vaccine  09/02/2007  . Flu Shot  10/02/2017  . DEXA scan (bone density measurement)  Completed  . Pneumonia vaccines  Completed    Health Maintenance for Postmenopausal Women Menopause is a normal process in which your reproductive ability comes to an end. This process happens gradually over a span of months to years, usually between the ages of 86 and 17. Menopause is complete when you have missed 12 consecutive menstrual periods. It is important to talk with your health care provider about some of the most common conditions that affect postmenopausal women, such as heart disease, cancer, and bone loss (osteoporosis). Adopting a healthy lifestyle and getting preventive care can help to promote your health and wellness. Those actions can also lower your chances of developing some of these common conditions. What should I know about menopause? During menopause, you may experience a number of symptoms, such as:  Moderate-to-severe hot flashes.  Night sweats.  Decrease in sex drive.  Mood  swings.  Headaches.  Tiredness.  Irritability.  Memory problems.  Insomnia.  Choosing to treat or not to treat menopausal changes is an individual decision that you make with your health care provider. What should I know about hormone replacement therapy and supplements? Hormone therapy products are effective for treating symptoms that are associated with menopause, such as hot flashes and night sweats. Hormone replacement carries certain risks, especially as you become older. If you are thinking about using estrogen or estrogen with progestin treatments, discuss the benefits and risks with your health care provider. What should I know about heart disease and stroke? Heart disease, heart attack, and stroke become more likely as you age. This may be due, in part, to the hormonal changes that your body experiences during menopause. These can affect how your body processes dietary fats, triglycerides, and cholesterol. Heart attack and stroke are both medical emergencies. There are many things that you can do to help prevent heart disease and stroke:  Have your blood pressure checked at least every 1-2 years. High blood pressure causes heart disease and increases the risk of stroke.  If you are 8-7 years old, ask your health care provider if you should take aspirin to prevent a heart attack or a stroke.  Do not use any tobacco products, including cigarettes, chewing tobacco, or electronic cigarettes. If you need help quitting, ask your health care provider.  It is important to eat a healthy diet and maintain a healthy weight. ? Be sure to include plenty of vegetables, fruits, low-fat dairy products, and lean protein. ? Avoid eating foods that are high  solid fats, added sugars, or salt (sodium).  Get regular exercise. This is one of the most important things that you can do for your health. ? Try to exercise for at least 150 minutes each week. The type of exercise that you do should  increase your heart rate and make you sweat. This is known as moderate-intensity exercise. ? Try to do strengthening exercises at least twice each week. Do these in addition to the moderate-intensity exercise.  Know your numbers.Ask your health care provider to check your cholesterol and your blood glucose. Continue to have your blood tested as directed by your health care provider.  What should I know about cancer screening? There are several types of cancer. Take the following steps to reduce your risk and to catch any cancer development as early as possible. Breast Cancer  Practice breast self-awareness. ? This means understanding how your breasts normally appear and feel. ? It also means doing regular breast self-exams. Let your health care provider know about any changes, no matter how small.  If you are 40 or older, have a clinician do a breast exam (clinical breast exam or CBE) every year. Depending on your age, family history, and medical history, it may be recommended that you also have a yearly breast X-ray (mammogram).  If you have a family history of breast cancer, talk with your health care provider about genetic screening.  If you are at high risk for breast cancer, talk with your health care provider about having an MRI and a mammogram every year.  Breast cancer (BRCA) gene test is recommended for women who have family members with BRCA-related cancers. Results of the assessment will determine the need for genetic counseling and BRCA1 and for BRCA2 testing. BRCA-related cancers include these types: ? Breast. This occurs in males or females. ? Ovarian. ? Tubal. This may also be called fallopian tube cancer. ? Cancer of the abdominal or pelvic lining (peritoneal cancer). ? Prostate. ? Pancreatic.  Cervical, Uterine, and Ovarian Cancer Your health care provider may recommend that you be screened regularly for cancer of the pelvic organs. These include your ovaries, uterus,  and vagina. This screening involves a pelvic exam, which includes checking for microscopic changes to the surface of your cervix (Pap test).  For women ages 21-65, health care providers may recommend a pelvic exam and a Pap test every three years. For women ages 30-65, they may recommend the Pap test and pelvic exam, combined with testing for human papilloma virus (HPV), every five years. Some types of HPV increase your risk of cervical cancer. Testing for HPV may also be done on women of any age who have unclear Pap test results.  Other health care providers may not recommend any screening for nonpregnant women who are considered low risk for pelvic cancer and have no symptoms. Ask your health care provider if a screening pelvic exam is right for you.  If you have had past treatment for cervical cancer or a condition that could lead to cancer, you need Pap tests and screening for cancer for at least 20 years after your treatment. If Pap tests have been discontinued for you, your risk factors (such as having a new sexual partner) need to be reassessed to determine if you should start having screenings again. Some women have medical problems that increase the chance of getting cervical cancer. In these cases, your health care provider may recommend that you have screening and Pap tests more often.  If you have   a family history of uterine cancer or ovarian cancer, talk with your health care provider about genetic screening.  If you have vaginal bleeding after reaching menopause, tell your health care provider.  There are currently no reliable tests available to screen for ovarian cancer.  Lung Cancer Lung cancer screening is recommended for adults 55-80 years old who are at high risk for lung cancer because of a history of smoking. A yearly low-dose CT scan of the lungs is recommended if you:  Currently smoke.  Have a history of at least 30 pack-years of smoking and you currently smoke or have quit  within the past 15 years. A pack-year is smoking an average of one pack of cigarettes per day for one year.  Yearly screening should:  Continue until it has been 15 years since you quit.  Stop if you develop a health problem that would prevent you from having lung cancer treatment.  Colorectal Cancer  This type of cancer can be detected and can often be prevented.  Routine colorectal cancer screening usually begins at age 50 and continues through age 75.  If you have risk factors for colon cancer, your health care provider may recommend that you be screened at an earlier age.  If you have a family history of colorectal cancer, talk with your health care provider about genetic screening.  Your health care provider may also recommend using home test kits to check for hidden blood in your stool.  A small camera at the end of a tube can be used to examine your colon directly (sigmoidoscopy or colonoscopy). This is done to check for the earliest forms of colorectal cancer.  Direct examination of the colon should be repeated every 5-10 years until age 75. However, if early forms of precancerous polyps or small growths are found or if you have a family history or genetic risk for colorectal cancer, you may need to be screened more often.  Skin Cancer  Check your skin from head to toe regularly.  Monitor any moles. Be sure to tell your health care provider: ? About any new moles or changes in moles, especially if there is a change in a mole's shape or color. ? If you have a mole that is larger than the size of a pencil eraser.  If any of your family members has a history of skin cancer, especially at a young age, talk with your health care provider about genetic screening.  Always use sunscreen. Apply sunscreen liberally and repeatedly throughout the day.  Whenever you are outside, protect yourself by wearing long sleeves, pants, a wide-brimmed hat, and sunglasses.  What should I know  about osteoporosis? Osteoporosis is a condition in which bone destruction happens more quickly than new bone creation. After menopause, you may be at an increased risk for osteoporosis. To help prevent osteoporosis or the bone fractures that can happen because of osteoporosis, the following is recommended:  If you are 19-50 years old, get at least 1,000 mg of calcium and at least 600 mg of vitamin D per day.  If you are older than age 50 but younger than age 70, get at least 1,200 mg of calcium and at least 600 mg of vitamin D per day.  If you are older than age 70, get at least 1,200 mg of calcium and at least 800 mg of vitamin D per day.  Smoking and excessive alcohol intake increase the risk of osteoporosis. Eat foods that are rich in calcium and   and vitamin D, and do weight-bearing exercises several times each week as directed by your health care provider. What should I know about how menopause affects my mental health? Depression may occur at any age, but it is more common as you become older. Common symptoms of depression include:  Low or sad mood.  Changes in sleep patterns.  Changes in appetite or eating patterns.  Feeling an overall lack of motivation or enjoyment of activities that you previously enjoyed.  Frequent crying spells.  Talk with your health care provider if you think that you are experiencing depression. What should I know about immunizations? It is important that you get and maintain your immunizations. These include:  Tetanus, diphtheria, and pertussis (Tdap) booster vaccine.  Influenza every year before the flu season begins.  Pneumonia vaccine.  Shingles vaccine.  Your health care provider may also recommend other immunizations. This information is not intended to replace advice given to you by your health care provider. Make sure you discuss any questions you have with your health care provider. Document Released: 04/12/2005 Document Revised: 09/08/2015  Document Reviewed: 11/22/2014 Elsevier Interactive Patient Education  2018 [employer].

## 2017-09-16 ENCOUNTER — Ambulatory Visit (HOSPITAL_BASED_OUTPATIENT_CLINIC_OR_DEPARTMENT_OTHER)
Admission: RE | Admit: 2017-09-16 | Discharge: 2017-09-16 | Disposition: A | Discharge status: Home / Self Care | Payer: Medicare Other | Source: Ambulatory Visit | Attending: Family Medicine | Admitting: Family Medicine

## 2017-09-16 ENCOUNTER — Encounter: Payer: Medicare Other | Admitting: Obstetrics & Gynecology

## 2017-09-16 DIAGNOSIS — Z780 Asymptomatic menopausal state: Secondary | ICD-10-CM | POA: Diagnosis not present

## 2017-09-16 DIAGNOSIS — M85851 Other specified disorders of bone density and structure, right thigh: Secondary | ICD-10-CM | POA: Insufficient documentation

## 2017-09-25 ENCOUNTER — Other Ambulatory Visit: Payer: Self-pay | Admitting: Family Medicine

## 2017-09-25 DIAGNOSIS — K219 Gastro-esophageal reflux disease without esophagitis: Secondary | ICD-10-CM

## 2017-09-30 ENCOUNTER — Encounter: Payer: Self-pay | Admitting: Obstetrics & Gynecology

## 2017-09-30 ENCOUNTER — Ambulatory Visit (INDEPENDENT_AMBULATORY_CARE_PROVIDER_SITE_OTHER): Payer: Medicare Other | Admitting: Obstetrics & Gynecology

## 2017-09-30 VITALS — BP 140/80 | Ht 62.0 in | Wt 155.0 lb

## 2017-09-30 DIAGNOSIS — Z01419 Encounter for gynecological examination (general) (routine) without abnormal findings: Secondary | ICD-10-CM | POA: Diagnosis not present

## 2017-09-30 DIAGNOSIS — M8589 Other specified disorders of bone density and structure, multiple sites: Secondary | ICD-10-CM

## 2017-09-30 DIAGNOSIS — Z8041 Family history of malignant neoplasm of ovary: Secondary | ICD-10-CM

## 2017-09-30 DIAGNOSIS — Z780 Asymptomatic menopausal state: Secondary | ICD-10-CM

## 2017-09-30 DIAGNOSIS — Z803 Family history of malignant neoplasm of breast: Secondary | ICD-10-CM

## 2017-09-30 NOTE — Progress Notes (Signed)
Amanda [name] Jan 26, 1941 [id number]   History:    78 y.o. G3P3L2 widowed, remarried.  RP:  Established patient presenting for annual gyn exam   HPI: Menopause, well on no HRT.  No PMB.  No pelvic pain.  Abstinent.  Urine normal, with mild SUI when bladder overfilled.  BMs wnl.  Breasts wnl.  BMI 28.35.  Active in the community, drives, plays the [employer], church...  Health labs with Fam MD.  Mother with h/o Breast Ca, then Ovarian Ca.  Patient declines genetic screening.  Pelvic US negative 07/2015.  Past medical history,surgical history, family history and social history were all reviewed and documented in the EPIC chart.  Gynecologic History No LMP recorded. Patient is postmenopausal. Contraception: post menopausal status Last Pap: 2013. Results were: Negative Last mammogram: 04/2016. Results were: Negative Bone Density: 09/2017 Osteopenia T-Score -2.1 Colonoscopy: 2014  Obstetric History OB History  Gravida Para Term Preterm AB Living  3 3   2   2   SAB TAB Ectopic Multiple Live Births          2    # Outcome Date GA Lbr Len/2nd Weight Sex Delivery Anes PTL Lv  3 Para     M CS-Unspec   FD  2 Preterm     M Vag-Spont  Y LIV  1 Preterm     F Vag-Spont  N LIV     ROS: A ROS was performed and pertinent positives and negatives are included in the history.  GENERAL: No fevers or chills. HEENT: No change in vision, no earache, sore throat or sinus congestion. NECK: No pain or stiffness. CARDIOVASCULAR: No chest pain or pressure. No palpitations. PULMONARY: No shortness of breath, cough or wheeze. GASTROINTESTINAL: No abdominal pain, nausea, vomiting or diarrhea, melena or bright red blood per rectum. GENITOURINARY: No urinary frequency, urgency, hesitancy or dysuria. MUSCULOSKELETAL: No joint or muscle pain, no back pain, no recent trauma. DERMATOLOGIC: No rash, no itching, no lesions. ENDOCRINE: No polyuria, polydipsia, no heat or cold intolerance. No recent change in weight.  HEMATOLOGICAL: No anemia or easy bruising or bleeding. NEUROLOGIC: No headache, seizures, numbness, tingling or weakness. PSYCHIATRIC: No depression, no loss of interest in normal activity or change in sleep pattern.     Exam:   BP 140/80   Ht 5\' 2"  (1.575 m)   Wt 155 lb (70.3 kg)   BMI 28.35 kg/m   Body mass index is 28.35 kg/m.  General appearance : Well developed well nourished female. No acute distress HEENT: Eyes: no retinal hemorrhage or exudates,  Neck supple, trachea midline, no carotid bruits, no thyroidmegaly Lungs: Clear to auscultation, no rhonchi or wheezes, or rib retractions  Heart: Regular rate and rhythm, no murmurs or gallops Breast:Examined in sitting and supine position were symmetrical in appearance, no palpable masses or tenderness,  no skin retraction, no nipple inversion, no nipple discharge, no skin discoloration, no axillary or supraclavicular lymphadenopathy Abdomen: no palpable masses or tenderness, no rebound or guarding Extremities: no edema or skin discoloration or tenderness  Pelvic: Vulva: Normal             Vagina: No gross lesions or discharge  Cervix: No gross lesions or discharge  Uterus  AV, normal size, shape and consistency, non-tender and mobile  Adnexa  Without masses or tenderness  Anus: Normal   Assessment/Plan:  77 y.o. female for annual exam   1. Well female exam with routine gynecological exam Normal gynecologic exam and menopause.  History  of normal Pap test with negative Pap test in 2013.  Currently abstinent.  No indication to repeat the Pap test at this time.  Breast exam normal.  Patient will schedule screening mammogram at the breast center.  Health labs with family physician.  Body mass index 28.35.  2. Menopause present Well on no hormone replacement therapy.  No postmenopausal bleeding.  3. Osteopenia of multiple sites Bone density in July 2019 showed osteopenia with a T score of -2.1.  Recommend vitamin D supplements,  calcium rich nutrition and regular weightbearing physical activity.  4. Family history of breast cancer in mother Normal breast exam.  Will schedule screening mammogram.  Declines genetic screening.  5. Family history of ovarian cancer No pelvic pain.  Last pelvic ultrasound negative in May 2017.  Normal gynecologic exam today.  Patient prefers observation at this point.  Patient will follow-up for a breast and pelvic exam in 2 years.  Princess Bruins MD, 3:42 PM 09/30/2017

## 2017-09-30 NOTE — Patient Instructions (Signed)
1. Well female exam with routine gynecological exam Normal gynecologic exam and menopause.  History of normal Pap test with negative Pap test in 2013.  Currently abstinent.  No indication to repeat the Pap test at this time.  Breast exam normal.  Patient will schedule screening mammogram at the breast center.  Health labs with family physician.  Body mass index 28.35.  2. Menopause present Well on no hormone replacement therapy.  No postmenopausal bleeding.  3. Osteopenia of multiple sites Bone density in July 2019 showed osteopenia with a T score of -2.1.  Recommend vitamin D supplements, calcium rich nutrition and regular weightbearing physical activity.  4. Family history of breast cancer in mother Normal breast exam.  Will schedule screening mammogram.  Declines genetic screening.  5. Family history of ovarian cancer No pelvic pain.  Last pelvic ultrasound negative in May 2017.  Normal gynecologic exam today.  Patient prefers observation at this point.  Amanda [name], it was a pleasure meeting you today!

## 2017-10-30 DIAGNOSIS — Z23 Encounter for immunization: Secondary | ICD-10-CM | POA: Diagnosis not present

## 2017-11-14 ENCOUNTER — Other Ambulatory Visit: Payer: Self-pay | Admitting: Family Medicine

## 2017-11-24 ENCOUNTER — Other Ambulatory Visit: Payer: Self-pay | Admitting: Family Medicine

## 2017-11-27 ENCOUNTER — Encounter: Payer: Self-pay | Admitting: Family Medicine

## 2017-11-27 ENCOUNTER — Other Ambulatory Visit: Payer: Self-pay | Admitting: Family Medicine

## 2017-11-27 DIAGNOSIS — Z1231 Encounter for screening mammogram for malignant neoplasm of breast: Secondary | ICD-10-CM

## 2017-11-27 NOTE — Telephone Encounter (Signed)
We probably need to see it.  It sometimes can go away or may be infection

## 2017-12-15 ENCOUNTER — Ambulatory Visit (HOSPITAL_BASED_OUTPATIENT_CLINIC_OR_DEPARTMENT_OTHER)
Admission: RE | Admit: 2017-12-15 | Discharge: 2017-12-15 | Disposition: A | Discharge status: Home / Self Care | Payer: Medicare Other | Source: Ambulatory Visit | Attending: Family Medicine | Admitting: Family Medicine

## 2017-12-15 DIAGNOSIS — Z1231 Encounter for screening mammogram for malignant neoplasm of breast: Secondary | ICD-10-CM | POA: Diagnosis not present

## 2017-12-17 DIAGNOSIS — D2371 Other benign neoplasm of skin of right lower limb, including hip: Secondary | ICD-10-CM | POA: Diagnosis not present

## 2017-12-17 DIAGNOSIS — B353 Tinea pedis: Secondary | ICD-10-CM | POA: Diagnosis not present

## 2017-12-17 DIAGNOSIS — L821 Other seborrheic keratosis: Secondary | ICD-10-CM | POA: Diagnosis not present

## 2017-12-17 DIAGNOSIS — L820 Inflamed seborrheic keratosis: Secondary | ICD-10-CM | POA: Diagnosis not present

## 2017-12-17 DIAGNOSIS — D2272 Melanocytic nevi of left lower limb, including hip: Secondary | ICD-10-CM | POA: Diagnosis not present

## 2017-12-17 DIAGNOSIS — Z86018 Personal history of other benign neoplasm: Secondary | ICD-10-CM | POA: Diagnosis not present

## 2017-12-17 DIAGNOSIS — Z85828 Personal history of other malignant neoplasm of skin: Secondary | ICD-10-CM | POA: Diagnosis not present

## 2017-12-17 DIAGNOSIS — L570 Actinic keratosis: Secondary | ICD-10-CM | POA: Diagnosis not present

## 2017-12-30 DIAGNOSIS — H40013 Open angle with borderline findings, low risk, bilateral: Secondary | ICD-10-CM | POA: Diagnosis not present

## 2018-01-10 ENCOUNTER — Other Ambulatory Visit: Payer: Self-pay | Admitting: Family Medicine

## 2018-01-12 ENCOUNTER — Other Ambulatory Visit: Payer: Self-pay | Admitting: Family Medicine

## 2018-02-10 ENCOUNTER — Other Ambulatory Visit: Payer: Self-pay | Admitting: Family Medicine

## 2018-02-13 ENCOUNTER — Other Ambulatory Visit: Payer: Self-pay | Admitting: Family Medicine

## 2018-02-18 ENCOUNTER — Other Ambulatory Visit: Payer: Self-pay | Admitting: Family Medicine

## 2018-02-18 ENCOUNTER — Telehealth: Payer: Self-pay

## 2018-02-18 DIAGNOSIS — E039 Hypothyroidism, unspecified: Secondary | ICD-10-CM

## 2018-02-18 MED ORDER — LEVOTHYROXINE SODIUM 125 MCG PO TABS: ORAL_TABLET | ORAL | 0 refills | Status: DC

## 2018-02-18 NOTE — Telephone Encounter (Signed)
Pt. Calling requesting levothyroxine refill. Pt. Due for refill, refill sent to preferred pharmacy. Author encouraged [facility] to encourage pt. To make OV with Dr. Etter Sjogren for routine follow-up on TSH, etc., and [facility] stated they would relay.

## 2018-02-18 NOTE — Telephone Encounter (Signed)
Pt needs ov--- refill sent

## 2018-02-18 NOTE — Telephone Encounter (Signed)
Patient notified

## 2018-02-27 ENCOUNTER — Telehealth: Payer: Self-pay | Admitting: Family Medicine

## 2018-02-27 NOTE — Telephone Encounter (Signed)
Copied from [place] [id number]. Topic: Quick Communication - See Telephone Encounter >> Feb 27, 2018 10:22 AM Amanda [name] wrote: CRM for notification. See Telephone encounter for: 02/27/18.  Pt says that she went to a blood drive yesterday with church and was told that her hemaglobin is to low to give blood. Pt would like to know if PCP could prescribe her something for iron? Pt says that she hadn't noticed anything different other then she has felt a little tired but she thought it was her age.   Pharmacy: [facility] [address], [address] AT [facility] [address] [phone] (Phone) [phone] (Fax)

## 2018-02-27 NOTE — Telephone Encounter (Signed)
This is available over the counter (ferrous sulfate), but she may want to schedule appt with Dr. Etter Sjogren to have it checked and determine if there are concerns.

## 2018-02-27 NOTE — Telephone Encounter (Signed)
Please advise 

## 2018-03-02 NOTE — Telephone Encounter (Signed)
Left detailed message on machine of Dr. Serita Sheller notes.

## 2018-03-02 NOTE — Telephone Encounter (Signed)
Spoke with patient and she states she will get otc supplement and make an ov in a couple of months to recheck.

## 2018-03-10 ENCOUNTER — Other Ambulatory Visit: Payer: Self-pay | Admitting: Family Medicine

## 2018-03-10 ENCOUNTER — Telehealth: Payer: Self-pay

## 2018-03-10 DIAGNOSIS — H9191 Unspecified hearing loss, right ear: Secondary | ICD-10-CM

## 2018-03-10 DIAGNOSIS — H903 Sensorineural hearing loss, bilateral: Secondary | ICD-10-CM | POA: Diagnosis not present

## 2018-03-10 NOTE — Telephone Encounter (Signed)
Last seen March of 2019.  Do you want to see her or do referral?

## 2018-03-10 NOTE — Telephone Encounter (Signed)
Copied from [place] [id number]. Topic: Referral - Request for Referral >> Mar 10, 2018  1:33 PM Yvette Rack wrote: Has patient seen PCP for this complaint? No. Pt had an appt today already they told her that she need a referral from provider *If NO, is insurance requiring patient see PCP for this issue before PCP can refer them? 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

## 2018-03-10 NOTE — Telephone Encounter (Signed)
If its a new problem we need to see it--- if old--- ok to refer

## 2018-03-11 NOTE — Telephone Encounter (Signed)
I sent this back - asking if it was new-please see message

## 2018-03-16 NOTE — Telephone Encounter (Signed)
Patient stated that Dr. Etter Sjogren has treated for her before it was the right ear.  Referral placed

## 2018-03-25 ENCOUNTER — Encounter: Payer: Self-pay | Admitting: Family Medicine

## 2018-03-25 ENCOUNTER — Ambulatory Visit (INDEPENDENT_AMBULATORY_CARE_PROVIDER_SITE_OTHER): Payer: Medicare Other | Admitting: Family Medicine

## 2018-03-25 VITALS — BP 132/70 | HR 73 | Temp 98.4°F | Resp 18 | Ht 62.0 in | Wt 152.0 lb

## 2018-03-25 DIAGNOSIS — J209 Acute bronchitis, unspecified: Secondary | ICD-10-CM

## 2018-03-25 DIAGNOSIS — R05 Cough: Secondary | ICD-10-CM

## 2018-03-25 DIAGNOSIS — R062 Wheezing: Secondary | ICD-10-CM | POA: Diagnosis not present

## 2018-03-25 DIAGNOSIS — R059 Cough, unspecified: Secondary | ICD-10-CM

## 2018-03-25 MED ORDER — HYDROCODONE-HOMATROPINE 5-1.5 MG/5ML PO SYRP: 2.5000 mL | ORAL_SOLUTION | Freq: Three times a day (TID) | ORAL | 0 refills | Status: DC | PRN

## 2018-03-25 MED ORDER — DOXYCYCLINE HYCLATE 100 MG PO CAPS: 100.0000 mg | ORAL_CAPSULE | Freq: Two times a day (BID) | ORAL | 0 refills | Status: DC

## 2018-03-25 MED ORDER — HYDROCODONE-HOMATROPINE 5-1.5 MG/5ML PO SYRP
2.5000 mL | ORAL_SOLUTION | Freq: Three times a day (TID) | ORAL | 0 refills | Status: AC | PRN
Start: 2018-03-25 — End: 2018-03-30

## 2018-03-25 MED ORDER — ALBUTEROL SULFATE HFA 108 (90 BASE) MCG/ACT IN AERS: 2.0000 | INHALATION_SPRAY | Freq: Four times a day (QID) | RESPIRATORY_TRACT | 2 refills | Status: DC | PRN

## 2018-03-25 MED ORDER — IPRATROPIUM-ALBUTEROL 0.5-2.5 (3) MG/3ML IN SOLN
3.0000 mL | Freq: Once | RESPIRATORY_TRACT | Status: AC
  Administered 2018-03-25: 3 mL via RESPIRATORY_TRACT

## 2018-03-25 NOTE — Patient Instructions (Signed)
It was good to see you today, but I am sorry you are not feeling well. We will use doxycycline antibiotic, albuterol inhaler, and cough syrup for you.  Remember that the cough syrup can make you sleepy, do not use it when you need to drive.  Please let me know if you are not feeling better in the next few days, sooner if you are feeling worse

## 2018-03-25 NOTE — Progress Notes (Signed)
[facility] [address], [place], [place] [address] [phone]  Date:  03/25/2018   Name:  Amanda [name]   DOB:  [birth year]   MRN:  [id number]  PCP:  [name], DO    Chief Complaint: No chief complaint on file.   History of Present Illness:  Amanda [name] is a 78 y.o. very pleasant female patient who presents with the following:  Pt of Dr. Etter Sjogren who I have not seen in the past  Here today with illness About a week ago she started with cold sx- she developed a cough and wheezing She has had pneumonia a couple of times in the past No fever noted but she had some chills today  She started coughing up some material today  She also has noted a pinching feeling in her right ear  No vomiting or diarrhea She may   Her most recent bout with pneumonia was in 2016 She does not have asthma or COPD Her husband has been ill with a ST   Patient Active Problem List   Diagnosis Date Noted  . Facial numbness 02/17/2017  . Abnormal CT of the abdomen 10/27/2015  . Elevated serum creatinine 10/27/2015  . 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[place]) 12/05/2014  . Acute respiratory failure with hypoxia ([place]) 11/20/2014  . HLD (hyperlipidemia) 11/17/2014  . GERD (gastroesophageal reflux disease) 11/17/2014  . Cervical lymphadenitis 12/06/2013  . Family history of ovarian cancer 05/31/2013  . CAP (community acquired pneumonia) 07/02/2012  . Chest pain 07/02/2012  . Abnormal CT scan, head 07/02/2012  . Postmenopausal 03/10/2012  . Family history of breast cancer 12/12/2011  . IBS (irritable bowel syndrome) 12/12/2011  . Abdominal bloating 12/12/2011  . Chronic constipation 12/12/2011  . CARPAL TUNNEL SYNDROME, LEFT 04/21/2009  . GAIT DISTURBANCE 04/21/2009  . HYPERLIPIDEMIA 01/12/2009  . CERVICALGIA 09/12/2008  . Hypothyroidism 08/06/2006  . OSTEOPENIA 08/06/2006  . URINARY  INCONTINENCE 08/06/2006  . SKIN CANCER, HX OF 08/06/2006    Past Medical History:  Diagnosis Date  . Constipation, chronic   . GERD (gastroesophageal reflux disease)    zantac  . History of blood transfusion [place]  . Hyperlipidemia   . Hypothyroidism   . Macular degeneration 2013   Both eyes   . Osteopenia   . Pneumonia   . PONV (postoperative nausea and vomiting)    needs little anesthesia  . Shingles   . Shortness of breath    on exertion  . SUI (stress urinary incontinence, female)     Past Surgical History:  Procedure Laterality Date  . BREAST EXCISIONAL BIOPSY Left 1980  . CARPAL TUNNEL RELEASE  1999  . CATARACT EXTRACTION  2009, 2011   BOTH EYES  . [place]  . COLONOSCOPY      Dr Cristina Gong  . DILATION AND CURETTAGE OF UTERUS     X2  . HYSTEROSCOPY W/D&C  01/07/2012   Procedure: DILATATION AND CURETTAGE /HYSTEROSCOPY;  Surgeon: Terrance Mass, MD;  Location: [facility];  Service: Gynecology;  Laterality: N/A;  intrauterine foley catheter for tamponode   . TONSILLECTOMY AND ADENOIDECTOMY    . TUBAL LIGATION     BY LAPAROSCOPY  . WISDOM TOOTH EXTRACTION      Social History   Tobacco Use  . Smoking status: Never Smoker  . Smokeless tobacco: Never Used  Substance Use Topics  .  Alcohol use: Yes    Alcohol/week: 0.0 standard drinks    Comment: Red wine occasionally  . Drug use: No    Family History  Problem Relation Age of Onset  . Ovarian cancer Mother   . Breast cancer Mother 21  . Hypertension Father   . Prostate cancer Father   . Kidney failure Father   . Diabetes Father   . Hyperlipidemia Brother   . COPD Paternal Grandfather   . Stroke Maternal Grandfather     Allergies  Allergen Reactions  . Phenergan [Promethazine Hcl] Other (See Comments)    Jerking/agitation  . Levofloxacin Nausea And Vomiting  . Pravastatin Sodium Nausea And Vomiting    Medication list has been reviewed and updated.  Current Outpatient  Medications on File Prior to Visit  Medication Sig Dispense Refill  . acetaminophen (TYLENOL) 500 MG tablet Take 1,000 mg by mouth every 6 (six) hours as needed for mild pain.    [name] aspirin EC 81 MG tablet Take 1 tablet (81 mg total) by mouth daily.    [name] buPROPion (WELLBUTRIN XL) 150 MG 24 hr tablet TAKE 1 TABLET BY MOUTH EVERY DAY 90 tablet 0  . levothyroxine (SYNTHROID, LEVOTHROID) 125 MCG tablet Take one tablet daily 90 tablet 0  . Multiple Vitamins-Minerals (ICAPS) TABS Take 2 tablets by mouth daily. AREDS 1+2    . omeprazole (PRILOSEC) 20 MG capsule Take 1 capsule (20 mg total) by mouth daily. 90 capsule 3   No current facility-administered medications on file prior to visit.     Review of Systems:  As per HPI- otherwise negative. No fever, no vomiting   Physical Examination: Vitals:   03/25/18 1359  BP: 132/70  Pulse: 73  Resp: 18  Temp: 98.4 F (36.9 C)  SpO2: 96%   Vitals:   03/25/18 1359  Weight: 152 lb (68.9 kg)  Height: 5\' 2"  (1.575 m)   Body mass index is 27.8 kg/m. Ideal Body Weight: Weight in (lb) to have BMI = 25: 136.4  GEN: WDWN, NAD, Non-toxic, A & O x 3, normal weight, looks well HEENT: Atraumatic, Normocephalic. Neck supple. No masses, No LAD. Bilateral TM wnl, oropharynx normal.  PEERL,EOMI.   Ears and Nose: No external deformity. CV: RRR, No M/G/R. No JVD. No thrill. No extra heart sounds. PULM: CTA B, mild bilateral wheezes, no crackles, no rhonchi. No retractions. No resp. distress. No accessory muscle use. EXTR: No c/c/e NEURO Normal gait.  PSYCH: Normally interactive. Conversant. Not depressed or anxious appearing.  Calm demeanor.   Given a duoneb treatment -improvement of wheezing after neb treatment Assessment and Plan: Cough - Plan: ipratropium-albuterol (DUONEB) 0.5-2.5 (3) MG/3ML nebulizer solution 3 mL, HYDROcodone-homatropine (HYCODAN) 5-1.5 MG/5ML syrup, DISCONTINUED: HYDROcodone-homatropine (HYCODAN) 5-1.5 MG/5ML syrup  Wheezing -  Plan: ipratropium-albuterol (DUONEB) 0.5-2.5 (3) MG/3ML nebulizer solution 3 mL, albuterol (PROVENTIL HFA;VENTOLIN HFA) 108 (90 Base) MCG/ACT inhaler  Acute bronchitis, unspecified organism - Plan: doxycycline (VIBRAMYCIN) 100 MG capsule  Treated for bronchitis with doxycycline, albuterol and hycodan for cough Cautioned that hycodan may cause sedation  Asked her to alert me if not better in the next few days- Sooner if worse.   Meds ordered this encounter  Medications  . ipratropium-albuterol (DUONEB) 0.5-2.5 (3) MG/3ML nebulizer solution 3 mL  . albuterol (PROVENTIL HFA;VENTOLIN HFA) 108 (90 Base) MCG/ACT inhaler    Sig: Inhale 2 puffs into the lungs every 6 (six) hours as needed for wheezing or shortness of breath.    Dispense:  1 Inhaler  Refill:  2  . doxycycline (VIBRAMYCIN) 100 MG capsule    Sig: Take 1 capsule (100 mg total) by mouth 2 (two) times daily.    Dispense:  20 capsule    Refill:  0  . DISCONTD: HYDROcodone-homatropine (HYCODAN) 5-1.5 MG/5ML syrup    Sig: Take 2.5-5 mLs by mouth every 8 (eight) hours as needed for up to 5 days for cough.    Dispense:  60 mL    Refill:  0  . HYDROcodone-homatropine (HYCODAN) 5-1.5 MG/5ML syrup    Sig: Take 2.5-5 mLs by mouth every 8 (eight) hours as needed for up to 5 days for cough.    Dispense:  60 mL    Refill:  0     Signed Lamar Blinks, MD

## 2018-03-27 ENCOUNTER — Telehealth: Payer: Self-pay | Admitting: Family Medicine

## 2018-03-27 NOTE — Telephone Encounter (Signed)
Copied from [place] [id number]. Topic: General - Other >> Mar 27, 2018  1:12 PM Lennox Solders wrote: Reason for CRM: pt saw dr copland on 03-25-2018 and she has been sharing her husband mask from the nebulizer. Pt would like her own mask and nebulizer kit. Please sent to [address]

## 2018-03-30 ENCOUNTER — Encounter: Payer: Self-pay | Admitting: Family Medicine

## 2018-04-03 ENCOUNTER — Encounter: Payer: Self-pay | Admitting: Family Medicine

## 2018-04-03 MED ORDER — ALBUTEROL SULFATE 108 (90 BASE) MCG/ACT IN AEPB: 2.0000 | INHALATION_SPRAY | Freq: Four times a day (QID) | RESPIRATORY_TRACT | 3 refills | Status: DC | PRN

## 2018-04-14 ENCOUNTER — Telehealth: Payer: Self-pay | Admitting: *Deleted

## 2018-04-14 NOTE — Telephone Encounter (Signed)
Received Audiologic Report results from [facility]; forwarded to provider/SLS 02/11

## 2018-04-19 ENCOUNTER — Encounter: Payer: Self-pay | Admitting: Family Medicine

## 2018-04-20 ENCOUNTER — Telehealth: Payer: Self-pay

## 2018-04-20 ENCOUNTER — Ambulatory Visit: Payer: Self-pay

## 2018-04-20 NOTE — Telephone Encounter (Signed)
Call placed to pt. to discuss symptoms.  Left voice message for pt. to return call to office.

## 2018-04-20 NOTE — Telephone Encounter (Signed)
MyChart sent to [facility] triage.

## 2018-04-20 NOTE — Telephone Encounter (Signed)
Received MyChart message below:   Would like an appointment to discuss shortness of breath, tiredness and weakness in legs. Just not willing to accept "old age". Thanks   Can you call to triage?

## 2018-04-20 NOTE — Telephone Encounter (Signed)
Pt. returned call to office to discuss sx's of shortness of breath and overall fatigue.  Stated over past 2 weeks, the shortness of breath has gradually progressed.  Reported she does about 45 minutes of work around the house, and then becomes exhausted, and notices she is working harder to breathe, then has to sit down. Stated "legs are wobbly" when she has the episodes of feeling exhausted.  Reported she also feels her heart is pounding harder.  Denied chest pain.  C/o nausea in the morning and waves of nausea during the day.  Denied sweating.  Denied pain upon deep breath.  BP 132/79, pulse 76 @ 2:30 PM.  Had bronchitis about 3 weeks ago; coughing at intervals; bringing up "lightly colored mucus."  Reported "cough is lingering, but not getting worse."  Offered appt. On 2/18, but pt. Preferred to wait on 1st available for her PCP.  Appt. Given on 04/24/18 @ 10:45 AM.  Care advice given per protocol.  Encouraged to call back if her symptoms worsen.  Verb. Understanding.    Reason for Disposition . [1] MILD difficulty breathing (e.g., minimal/no SOB at rest, SOB with walking, pulse <100) AND [2] NEW-onset or WORSE than normal    Reported shortness of breath and increased feeling of being fatigued and exhausted with working around house after approx. 40-45 minutes.  Denied shortness of breath at rest.  Answer Assessment - Initial Assessment Questions 1. RESPIRATORY STATUS: "Describe your breathing?" (e.g., wheezing, shortness of breath, unable to speak, severe coughing)      Having to work harder to breathe; a little wheezing in vocal cords  2. ONSET: "When did this breathing problem begin?"      Gradually over 2 weeks the shortness of breath has progressed  3. PATTERN "Does the difficult breathing come and go, or has it been constant since it started?"      Feels her symptoms build with shortness of breath and fatigue 4. SEVERITY: "How bad is your breathing?" (e.g., mild, moderate, severe)    - MILD: No  SOB at rest, mild SOB with walking, speaks normally in sentences, can lay down, no retractions, pulse < 100.    - MODERATE: SOB at rest, SOB with minimal exertion and prefers to sit, cannot lie down flat, speaks in phrases, mild retractions, audible wheezing, pulse 100-120.    - SEVERE: Very SOB at rest, speaks in single words, struggling to breathe, sitting hunched forward, retractions, pulse > 120     "Moderate" 5. RECURRENT SYMPTOM: "Have you had difficulty breathing before?" If so, ask: "When was the last time?" and "What happened that time?"      No; was diagnosed with bronchitis 03/25/18 6. CARDIAC HISTORY: "Do you have any history of heart disease?" (e.g., heart attack, angina, bypass surgery, angioplasty)      Reported hx mitral valve prolapse 7. LUNG HISTORY: "Do you have any history of lung disease?"  (e.g., pulmonary embolus, asthma, emphysema)     Denied  8. CAUSE: "What do you think is causing the breathing problem?"      unknown 9. OTHER SYMPTOMS: "Do you have any other symptoms? (e.g., dizziness, runny nose, cough, chest pain, fever)     Feels like her heart is beating harder during episode of shortness of breath; c/o intermittent nausea; no vomiting; denied any chest pain; denied fever/ chills. 10. PREGNANCY: "Is there any chance you are pregnant?" "When was your last menstrual period?"       N/a 11. TRAVEL: "Have  you traveled out of the country in the last month?" (e.g., travel history, exposures)       No  Protocols used: BREATHING DIFFICULTY-A-AH

## 2018-04-20 NOTE — Telephone Encounter (Signed)
See [facility] triage telephone note.

## 2018-04-24 ENCOUNTER — Other Ambulatory Visit: Payer: Self-pay | Admitting: Family Medicine

## 2018-04-24 ENCOUNTER — Ambulatory Visit (INDEPENDENT_AMBULATORY_CARE_PROVIDER_SITE_OTHER): Payer: Medicare Other | Admitting: Family Medicine

## 2018-04-24 ENCOUNTER — Ambulatory Visit (HOSPITAL_BASED_OUTPATIENT_CLINIC_OR_DEPARTMENT_OTHER)
Admission: RE | Admit: 2018-04-24 | Discharge: 2018-04-24 | Disposition: A | Discharge status: Home / Self Care | Payer: Medicare Other | Source: Ambulatory Visit | Attending: Family Medicine | Admitting: Family Medicine

## 2018-04-24 ENCOUNTER — Ambulatory Visit: Payer: Self-pay | Admitting: *Deleted

## 2018-04-24 ENCOUNTER — Encounter: Payer: Self-pay | Admitting: Family Medicine

## 2018-04-24 VITALS — BP 128/72 | HR 72 | Temp 97.7°F | Ht 61.5 in | Wt 148.0 lb

## 2018-04-24 DIAGNOSIS — J189 Pneumonia, unspecified organism: Secondary | ICD-10-CM

## 2018-04-24 DIAGNOSIS — D649 Anemia, unspecified: Secondary | ICD-10-CM

## 2018-04-24 DIAGNOSIS — J9801 Acute bronchospasm: Secondary | ICD-10-CM

## 2018-04-24 DIAGNOSIS — R5383 Other fatigue: Secondary | ICD-10-CM

## 2018-04-24 DIAGNOSIS — E039 Hypothyroidism, unspecified: Secondary | ICD-10-CM | POA: Diagnosis not present

## 2018-04-24 DIAGNOSIS — R0602 Shortness of breath: Secondary | ICD-10-CM | POA: Diagnosis not present

## 2018-04-24 LAB — COMPREHENSIVE METABOLIC PANEL
ALBUMIN: 4.3 g/dL (ref 3.5–5.2)
ALK PHOS: 82 U/L (ref 39–117)
ALT: 17 U/L (ref 0–35)
AST: 24 U/L (ref 0–37)
BUN: 17 mg/dL (ref 6–23)
CHLORIDE: 102 meq/L (ref 96–112)
CO2: 30 mEq/L (ref 19–32)
Calcium: 10.6 mg/dL — ABNORMAL HIGH (ref 8.4–10.5)
Creatinine, Ser: 1.14 mg/dL (ref 0.40–1.20)
GFR: 45.94 mL/min — ABNORMAL LOW (ref >60.00)
Glucose, Bld: 96 mg/dL (ref 70–99)
POTASSIUM: 4.9 meq/L (ref 3.5–5.1)
Sodium: 141 mEq/L (ref 135–145)
TOTAL PROTEIN: 6.4 g/dL (ref 6.0–8.3)
Total Bilirubin: 0.4 mg/dL (ref 0.2–1.2)

## 2018-04-24 LAB — CBC WITH DIFFERENTIAL/PLATELET
BASOS PCT: 0.8 % (ref 0.0–3.0)
Basophils Absolute: 0.1 10*3/uL (ref 0.0–0.1)
EOS PCT: 6.2 % — AB (ref 0.0–5.0)
Eosinophils Absolute: 0.4 10*3/uL (ref 0.0–0.7)
HEMATOCRIT: 38 % (ref 36.0–46.0)
HEMOGLOBIN: 12.7 g/dL (ref 12.0–15.0)
LYMPHS PCT: 32.1 % (ref 12.0–46.0)
Lymphs Abs: 2.3 10*3/uL (ref 0.7–4.0)
MCHC: 33.4 g/dL (ref 30.0–36.0)
MCV: 79.9 fl (ref 78.0–100.0)
MONOS PCT: 14.1 % — AB (ref 3.0–12.0)
Monocytes Absolute: 1 10*3/uL (ref 0.1–1.0)
NEUTROS ABS: 3.3 10*3/uL (ref 1.4–7.7)
Neutrophils Relative %: 46.8 % (ref 43.0–77.0)
PLATELETS: 262 10*3/uL (ref 150.0–400.0)
RBC: 4.75 Mil/uL (ref 3.87–5.11)
RDW: 15 % (ref 11.5–15.5)
WBC: 7.1 10*3/uL (ref 4.0–10.5)

## 2018-04-24 LAB — TSH: TSH: 0.84 u[IU]/mL (ref 0.35–4.50)

## 2018-04-24 LAB — IBC PANEL
IRON: 56 ug/dL (ref 42–145)
Saturation Ratios: 16.7 % — ABNORMAL LOW (ref 20.0–50.0)
TRANSFERRIN: 240 mg/dL (ref 212.0–360.0)

## 2018-04-24 LAB — FERRITIN: FERRITIN: 137.8 ng/mL (ref 10.0–291.0)

## 2018-04-24 MED ORDER — ALBUTEROL SULFATE (2.5 MG/3ML) 0.083% IN NEBU: 2.5000 mg | INHALATION_SOLUTION | Freq: Four times a day (QID) | RESPIRATORY_TRACT | 12 refills | Status: DC | PRN

## 2018-04-24 MED ORDER — PREDNISONE 10 MG PO TABS: ORAL_TABLET | ORAL | 0 refills | Status: DC

## 2018-04-24 MED ORDER — ALBUTEROL SULFATE (2.5 MG/3ML) 0.083% IN NEBU
2.5000 mg | INHALATION_SOLUTION | Freq: Once | RESPIRATORY_TRACT | Status: AC
  Administered 2018-04-24: 2.5 mg via RESPIRATORY_TRACT

## 2018-04-24 MED ORDER — AZITHROMYCIN 250 MG PO TABS: ORAL_TABLET | ORAL | 0 refills | Status: DC

## 2018-04-24 MED ORDER — ALBUTEROL SULFATE (2.5 MG/3ML) 0.083% IN NEBU: 2.5000 mg | INHALATION_SOLUTION | Freq: Once | RESPIRATORY_TRACT | Status: DC

## 2018-04-24 NOTE — Telephone Encounter (Signed)
Pt called with wanting to know what she could do as for as activities with the dx of pneumonia, from today. She stated she felt fine now. Home care advice given: Get plenty of rest. Take medications as prescribed. Reminded to call back if feeling worst, fever. Pt voiced understanding. Routing to flow at [facility].

## 2018-04-24 NOTE — Patient Instructions (Signed)
Bronchospasm, Adult  Bronchospasm is a tightening of the airways going into the lungs. During an episode, it may be harder to breathe. You may cough, and you may make a whistling sound when you breathe (wheeze). This condition often affects people with asthma. What are the causes? This condition is caused by swelling and irritation in the airways. It can be triggered by:  An infection (common).  Seasonal allergies.  An allergic reaction.  Exercise.  Irritants. These include pollution, cigarette smoke, strong odors, aerosol sprays, and paint fumes.  Weather changes. Winds increase molds and pollens in the air. Cold air may cause swelling.  Stress and emotional upset. What are the signs or symptoms? Symptoms of this condition include:  Wheezing. If the episode was triggered by an allergy, wheezing may start right away or hours later.  Nighttime coughing.  Frequent or severe coughing with a simple cold.  Chest tightness.  Shortness of breath.  Decreased ability to exercise. How is this diagnosed? This condition is usually diagnosed with a review of your medical history and a physical exam. Tests, such as lung function tests, are sometimes done to look for other conditions. The need for a chest X-ray depends on where the wheezing occurs and whether it is the first time you have wheezed. How is this treated? This condition may be treated with:  Inhaled medicines. These open up the airways and help you breathe. They can be taken with an inhaler or a nebulizer device.  Corticosteroid medicines. These may be given for severe bronchospasm, usually when it is associated with asthma.  Avoiding triggers, such as irritants, infection, or allergies. Follow these instructions at home: Medicines  Take over-the-counter and prescription medicines only as told by your health care provider.  If you need to use an inhaler or nebulizer to take your medicine, ask your health care provider  to explain how to use it correctly. If you were given a spacer, always use it with your inhaler. Lifestyle  Reduce the number of triggers in your home. To do this: ? Change your heating and air conditioning filter at least once a month. ? Limit your use of fireplaces and wood stoves. ? Do not smoke. Do not allow smoking in your home. ? Avoid using perfumes and fragrances. ? Get rid of pests, such as roaches and mice, and their droppings. ? Remove any mold from your home. ? Keep your house clean and dust free. Use unscented cleaning products. ? Replace carpet with wood, tile, or vinyl flooring. Carpet can trap dander and dust. ? Use allergy-proof pillows, mattress covers, and box spring covers. ? Wash bed sheets and blankets every week in hot water. Dry them in a dryer. ? Use blankets that are made of polyester or cotton. ? Wash your hands often. ? Do not allow pets in your bedroom.  Avoid breathing in cold air when you exercise. General instructions  Have a plan for seeking medical care. Know when to call your health care provider and local emergency services, and where to get emergency care.  Stay up to date on your immunizations.  When you have an episode of bronchospasm, stay calm. Try to relax and breathe more slowly.  If you have asthma, make sure you have an asthma action plan.  Keep all follow-up visits as told by your health care provider. This is important. Contact a health care provider if:  You have muscle aches.  You have chest pain.  The mucus that you cough up (  If you have asthma, make sure you have an asthma action plan.  · Keep all follow-up visits as told by your health care provider. This is important.  Contact a health care provider if:  · You have muscle aches.  · You have chest pain.  · The mucus that you cough up (sputum) changes from clear or white to yellow, green, gray, or bloody.  · You have a fever.  · Your sputum gets thicker.  Get help right away if:  · Your wheezing and coughing get worse, even after you take your prescribed medicines.  · It gets even harder to breathe.  · You develop severe chest pain.  Summary  · Bronchospasm is a tightening of the airways going into the lungs.  · During an episode of  bronchospasm, you may have a harder time breathing. You may cough and make a whistling sound when you breathe (wheeze).  · Avoid exposure to triggers such as smoke, dust, mold, animal dander, and fragrances.  · When you have an episode of bronchospasm, stay calm. Try to relax and breathe more slowly.  This information is not intended to replace advice given to you by your health care provider. Make sure you discuss any questions you have with your health care provider.  Document Released: 02/21/2003 Document Revised: 02/15/2016 Document Reviewed: 02/15/2016  Elsevier Interactive Patient Education © 2019 Elsevier Inc.

## 2018-04-24 NOTE — Assessment & Plan Note (Signed)
With cough and congestion Neb improved breathing Check cxr  Depo medrol and pred taper

## 2018-04-24 NOTE — Assessment & Plan Note (Signed)
Xray reviewed===possible pneumonia z pack sent to pharmacy

## 2018-04-24 NOTE — Progress Notes (Signed)
Patient ID: Amanda [name], female    DOB: [birth year]  Age: 78 y.o. MRN: [id number]    Subjective:  Subjective  HPI Amanda [name] presents for cough and congestion and severe fatigue.  No fevers.  She was seen here recently-- ov reviewed.    Review of Systems  Constitutional: Positive for fatigue. Negative for chills and fever.  HENT: Positive for congestion, postnasal drip, rhinorrhea and sinus pressure.   Respiratory: Positive for cough, chest tightness, shortness of breath and wheezing.   Cardiovascular: Negative for chest pain, palpitations and leg swelling.  Allergic/Immunologic: Negative for environmental allergies.    History Past Medical History:  Diagnosis Date  . Constipation, chronic   . GERD (gastroesophageal reflux disease)    zantac  . History of blood transfusion [place]  . Hyperlipidemia   . Hypothyroidism   . Macular degeneration 2013   Both eyes   . Osteopenia   . Pneumonia   . PONV (postoperative nausea and vomiting)    needs little anesthesia  . Shingles   . Shortness of breath    on exertion  . SUI (stress urinary incontinence, female)     She has a past surgical history that includes Tonsillectomy and adenoidectomy; Cesarean section (0175); Wisdom tooth extraction; Cataract extraction (2009, 2011); Tubal ligation; Dilation and curettage of uterus; Carpal tunnel release (1999); Colonoscopy; Hysteroscopy w/D&C (01/07/2012); and Breast excisional biopsy (Left, 1980).   Her family history includes Breast cancer (age of onset: 10) in her mother; COPD in her paternal grandfather; Diabetes in her father; Hyperlipidemia in her brother; Hypertension in her father; Kidney failure in her father; Ovarian cancer in her mother; Prostate cancer in her father; Stroke in her maternal grandfather.She reports that she has never smoked. She has never used smokeless tobacco. She reports current alcohol use. She reports that she does not use drugs.  Current Outpatient  Medications on File Prior to Visit  Medication Sig Dispense Refill  . acetaminophen (TYLENOL) 500 MG tablet Take 1,000 mg by mouth every 6 (six) hours as needed for mild pain.    [name] aspirin EC 81 MG tablet Take 1 tablet (81 mg total) by mouth daily.    [name] buPROPion (WELLBUTRIN XL) 150 MG 24 hr tablet TAKE 1 TABLET BY MOUTH EVERY DAY 90 tablet 0  . levothyroxine (SYNTHROID, LEVOTHROID) 125 MCG tablet Take one tablet daily 90 tablet 0  . Multiple Vitamins-Minerals (ICAPS) TABS Take 2 tablets by mouth daily. AREDS 1+2    . omeprazole (PRILOSEC) 20 MG capsule Take 1 capsule (20 mg total) by mouth daily. 90 capsule 3   No current facility-administered medications on file prior to visit.      Objective:  Objective  Physical Exam Vitals signs and nursing note reviewed.  Constitutional:      Appearance: She is well-developed.  HENT:     Right Ear: External ear normal.     Left Ear: External ear normal.  Eyes:     General:        Right eye: No discharge.        Left eye: No discharge.     Conjunctiva/sclera: Conjunctivae normal.  Cardiovascular:     Rate and Rhythm: Normal rate and regular rhythm.     Heart sounds: Normal heart sounds. No murmur.  Pulmonary:     Effort: Pulmonary effort is normal. No respiratory distress.     Breath sounds: Wheezing present. No rales.  Chest:     Chest  wall: No tenderness.  Lymphadenopathy:     Cervical: Cervical adenopathy present.  Neurological:     Mental Status: She is alert and oriented to person, place, and time.    BP 128/72   Pulse 72   Temp 97.7 F (36.5 C)   Ht 5' 1.5" (1.562 m)   Wt 148 lb (67.1 kg)   SpO2 95%   BMI 27.51 kg/m  Wt Readings from Last 3 Encounters:  04/24/18 148 lb (67.1 kg)  03/25/18 152 lb (68.9 kg)  09/30/17 155 lb (70.3 kg)     Lab Results  Component Value Date   WBC 7.1 04/24/2018   HGB 12.7 04/24/2018   HCT 38.0 04/24/2018   PLT 262.0 04/24/2018   GLUCOSE 96 04/24/2018   CHOL 186 02/17/2017   TRIG  122.0 02/17/2017   HDL 60.40 02/17/2017   LDLDIRECT 142.8 11/22/2010   LDLCALC 101 (H) 02/17/2017   ALT 17 04/24/2018   AST 24 04/24/2018   NA 141 04/24/2018   K 4.9 04/24/2018   CL 102 04/24/2018   CREATININE 1.14 04/24/2018   BUN 17 04/24/2018   CO2 30 04/24/2018   TSH 0.84 04/24/2018   INR 1.32 11/18/2014   HGBA1C 6.1 (H) 11/18/2014    Mm 3d Screen Breast Bilateral  Result Date: 12/15/2017 CLINICAL DATA:  Screening. EXAM: DIGITAL SCREENING BILATERAL MAMMOGRAM WITH TOMO AND CAD COMPARISON:  Previous exam(s). ACR Breast Density Category b: There are scattered areas of fibroglandular density. FINDINGS: There are no findings suspicious for malignancy. Images were processed with CAD. IMPRESSION: No mammographic evidence of malignancy. A result letter of this screening mammogram will be mailed directly to the patient. RECOMMENDATION: Screening mammogram in one year. (Code:SM-B-01Y) BI-RADS CATEGORY  1: Negative. Electronically Signed   By: Fidela Salisbury M.D.   On: 12/15/2017 17:05     Assessment & Plan:  Plan  I have discontinued Sion C. Mutschler's albuterol, doxycycline, and Albuterol Sulfate. I am also having her start on albuterol and predniSONE. Additionally, I am having her maintain her ICAPS, acetaminophen, aspirin EC, omeprazole, buPROPion, and levothyroxine. We administered albuterol.  Meds ordered this encounter  Medications  . albuterol (PROVENTIL) (2.5 MG/3ML) 0.083% nebulizer solution    Sig: Take 3 mLs (2.5 mg total) by nebulization every 6 (six) hours as needed for wheezing or shortness of breath.    Dispense:  75 mL    Refill:  12  . predniSONE (DELTASONE) 10 MG tablet    Sig: TAKE 3 TABLETS PO QD FOR 3 DAYS THEN TAKE 2 TABLETS PO QD FOR 3 DAYS THEN TAKE 1 TABLET PO QD FOR 3 DAYS THEN TAKE 1/2 TAB PO QD FOR 3 DAYS    Dispense:  20 tablet    Refill:  0  . DISCONTD: albuterol (PROVENTIL) (2.5 MG/3ML) 0.083% nebulizer solution 2.5 mg  . albuterol (PROVENTIL) (2.5  MG/3ML) 0.083% nebulizer solution 2.5 mg    Problem List Items Addressed This Visit      Unprioritized   Bronchospasm - Primary    With cough and congestion Neb improved breathing Check cxr  Depo medrol and pred taper       Relevant Medications   albuterol (PROVENTIL) (2.5 MG/3ML) 0.083% nebulizer solution   predniSONE (DELTASONE) 10 MG tablet   albuterol (PROVENTIL) (2.5 MG/3ML) 0.083% nebulizer solution 2.5 mg (Completed)   Other Relevant Orders   DG Chest 2 View (Completed)   Fatigue   Relevant Orders   CBC with Differential/Platelet (Completed)   TSH (  Completed)   Comprehensive metabolic panel (Completed)   IBC panel (Completed)   Ferritin (Completed)   Hypothyroidism   Relevant Orders   TSH (Completed)   Comprehensive metabolic panel (Completed)   IBC panel (Completed)   Ferritin (Completed)    Other Visit Diagnoses    Anemia, unspecified type       Relevant Orders   CBC with Differential/Platelet (Completed)   TSH (Completed)   Comprehensive metabolic panel (Completed)   IBC panel (Completed)   Ferritin (Completed)      Follow-up: Return in about 2 weeks (around 05/08/2018), or if symptoms worsen or fail to improve, for f/u breathing , fatigue.  [name], DO

## 2018-04-28 NOTE — Telephone Encounter (Signed)
Agree---  Just don't over do it Keep f/u app

## 2018-04-28 NOTE — Telephone Encounter (Signed)
Left detailed message on machine.

## 2018-05-01 ENCOUNTER — Telehealth: Payer: Self-pay

## 2018-05-01 NOTE — Telephone Encounter (Signed)
PA initiated via Covermymeds; KEY: [id number]. Awaiting determination.

## 2018-05-01 NOTE — Telephone Encounter (Signed)
PA denied.   AJOINO:[id number];BSJGGE:[id number];Review Type:Prior Auth;Appeal Information: [place] [id number]. [id number] [phone] [phone] WebAddress:[email or web address];

## 2018-05-08 ENCOUNTER — Ambulatory Visit (INDEPENDENT_AMBULATORY_CARE_PROVIDER_SITE_OTHER): Payer: Medicare Other | Admitting: Family Medicine

## 2018-05-08 ENCOUNTER — Encounter: Payer: Self-pay | Admitting: Family Medicine

## 2018-05-08 VITALS — BP 118/68 | HR 62 | Ht 61.5 in | Wt 147.0 lb

## 2018-05-08 DIAGNOSIS — Z8701 Personal history of pneumonia (recurrent): Secondary | ICD-10-CM

## 2018-05-08 NOTE — Patient Instructions (Signed)
Community-Acquired Pneumonia, Adult °Pneumonia is an infection of the lungs. There are different types of pneumonia. One type can develop while a person is in a hospital. A different type, called community-acquired pneumonia, develops in people who are not, or have not recently been, in the hospital or other health care facility. °What are the causes? ° °Pneumonia may be caused by bacteria, viruses, or funguses. Community-acquired pneumonia is often caused by Streptococcus pneumonia bacteria. These bacteria are often passed from one person to another by breathing in droplets from the cough or sneeze of an infected person. °What increases the risk? °The condition is more likely to develop in: °· People who have chronic diseases, such as chronic obstructive pulmonary disease (COPD), asthma, congestive heart failure, cystic fibrosis, diabetes, or kidney disease. °· People who have early-stage or late-stage HIV. °· People who have sickle cell disease. °· People who have had their spleen removed (splenectomy). °· People who have poor dental hygiene. °· People who have medical conditions that increase the risk of breathing in (aspirating) secretions their own mouth and nose. °· People who have a weakened immune system (immunocompromised). °· People who smoke. °· People who travel to areas where pneumonia-causing germs commonly exist. °· People who are around animal habitats or animals that have pneumonia-causing germs, including birds, bats, rabbits, cats, and farm animals. °What are the signs or symptoms? °Symptoms of this condition include: °· A dry cough. °· A wet (productive) cough. °· Fever. °· Sweating. °· Chest pain, especially when breathing deeply or coughing. °· Rapid breathing or difficulty breathing. °· Shortness of breath. °· Shaking chills. °· Fatigue. °· Muscle aches. °How is this diagnosed? °Your health care provider will take a medical history and perform a physical exam. You may also have other tests,  including: °· Imaging studies of your chest, including X-rays. °· Tests to check your blood oxygen level and other blood gases. °· Other tests on blood, mucus (sputum), fluid around your lungs (pleural fluid), and urine. °If your pneumonia is severe, other tests may be done to identify the specific cause of your illness. °How is this treated? °The type of treatment that you receive depends on many factors, such as the cause of your pneumonia, the medicines you take, and other medical conditions that you have. For most adults, treatment and recovery from pneumonia may occur at home. In some cases, treatment must happen in a hospital. Treatment may include: °· Antibiotic medicines, if the pneumonia was caused by bacteria. °· Antiviral medicines, if the pneumonia was caused by a virus. °· Medicines that are given by mouth or through an IV tube. °· Oxygen. °· Respiratory therapy. °Although rare, treating severe pneumonia may include: °· Mechanical ventilation. This is done if you are not breathing well on your own and you cannot maintain a safe blood oxygen level. °· Thoracentesis. This procedure removes fluid around one lung or both lungs to help you breathe better. °Follow these instructions at home: ° °· Take over-the-counter and prescription medicines only as told by your health care provider. °? Only take cough medicine if you are losing sleep. Understand that cough medicine can prevent your body’s natural ability to remove mucus from your lungs. °? If you were prescribed an antibiotic medicine, take it as told by your health care provider. Do not stop taking the antibiotic even if you start to feel better. °· Sleep in a semi-upright position at night. Try sleeping in a reclining chair, or place a few pillows under your head. °· Do not use tobacco products, including cigarettes, chewing tobacco, and e-cigarettes.   If you need help quitting, ask your health care provider. °· Drink enough water to keep your urine  clear or pale yellow. This will help to thin out mucus secretions in your lungs. °How is this prevented? °There are ways that you can decrease your risk of developing community-acquired pneumonia. Consider getting a pneumococcal vaccine if: °· You are older than 80 years of age. °· You are older than 80 years of age and are undergoing cancer treatment, have chronic lung disease, or have other medical conditions that affect your immune system. Ask your health care provider if this applies to you. °There are different types and schedules of pneumococcal vaccines. Ask your health care provider which vaccination option is best for you. °You may also prevent community-acquired pneumonia if you take these actions: °· Get an influenza vaccine every year. Ask your health care provider which type of influenza vaccine is best for you. °· Go to the dentist on a regular basis. °· Wash your hands often. Use hand sanitizer if soap and water are not available. °Contact a health care provider if: °· You have a fever. °· You are losing sleep because you cannot control your cough with cough medicine. °Get help right away if: °· You have worsening shortness of breath. °· You have increased chest pain. °· Your sickness becomes worse, especially if you are an older adult or have a weakened immune system. °· You cough up blood. °This information is not intended to replace advice given to you by your health care provider. Make sure you discuss any questions you have with your health care provider. °Document Released: 02/18/2005 Document Revised: 11/07/2016 Document Reviewed: 06/15/2014 °Elsevier Interactive Patient Education © 2019 [employer] ° °

## 2018-05-09 NOTE — Progress Notes (Signed)
Patient ID: Amanda [name], female    DOB: [birth year]  Age: 78 y.o. MRN: [id number]    Subjective:  Subjective  HPI Amanda [name] presents for f/u pneumonia.  She is feeling much better but is tired.  abx finished.  Review of Systems  Constitutional: Positive for fatigue. Negative for chills and fever.  HENT: Negative for congestion and hearing loss.   Eyes: Negative for discharge.  Respiratory: Negative for cough and shortness of breath.   Cardiovascular: Negative for chest pain, palpitations and leg swelling.  Gastrointestinal: Negative for abdominal pain, blood in stool, constipation, diarrhea, nausea and vomiting.  Genitourinary: Negative for dysuria, frequency, hematuria and urgency.  Musculoskeletal: Negative for back pain and myalgias.  Skin: Negative for rash.  Allergic/Immunologic: Negative for environmental allergies.  Neurological: Negative for dizziness, weakness and headaches.  Hematological: Does not bruise/bleed easily.  Psychiatric/Behavioral: Negative for suicidal ideas. The patient is not nervous/anxious.     History Past Medical History:  Diagnosis Date  . Constipation, chronic   . GERD (gastroesophageal reflux disease)    zantac  . History of blood transfusion [facility]  . Hyperlipidemia   . Hypothyroidism   . Macular degeneration 2013   Both eyes   . Osteopenia   . Pneumonia   . PONV (postoperative nausea and vomiting)    needs little anesthesia  . Shingles   . Shortness of breath    on exertion  . SUI (stress urinary incontinence, female)     She has a past surgical history that includes Tonsillectomy and adenoidectomy; Cesarean section (9937); Wisdom tooth extraction; Cataract extraction (2009, 2011); Tubal ligation; Dilation and curettage of uterus; Carpal tunnel release (1999); Colonoscopy; Hysteroscopy w/D&C (01/07/2012); and Breast excisional biopsy (Left, 1980).   Her family history includes Breast cancer (age of onset: 93) in her  mother; COPD in her paternal grandfather; Diabetes in her father; Hyperlipidemia in her brother; Hypertension in her father; Kidney failure in her father; Ovarian cancer in her mother; Prostate cancer in her father; Stroke in her maternal grandfather.She reports that she has never smoked. She has never used smokeless tobacco. She reports current alcohol use. She reports that she does not use drugs.  Current Outpatient Medications on File Prior to Visit  Medication Sig Dispense Refill  . acetaminophen (TYLENOL) 500 MG tablet Take 1,000 mg by mouth every 6 (six) hours as needed for mild pain.    [name] albuterol (PROVENTIL) (2.5 MG/3ML) 0.083% nebulizer solution Take 3 mLs (2.5 mg total) by nebulization every 6 (six) hours as needed for wheezing or shortness of breath. 75 mL 12  . aspirin EC 81 MG tablet Take 1 tablet (81 mg total) by mouth daily.    [name] azithromycin (ZITHROMAX Z-PAK) 250 MG tablet As directed 6 each 0  . buPROPion (WELLBUTRIN XL) 150 MG 24 hr tablet TAKE 1 TABLET BY MOUTH EVERY DAY 90 tablet 0  . levothyroxine (SYNTHROID, LEVOTHROID) 125 MCG tablet Take one tablet daily 90 tablet 0  . Multiple Vitamins-Minerals (ICAPS) TABS Take 2 tablets by mouth daily. AREDS 1+2    . omeprazole (PRILOSEC) 20 MG capsule Take 1 capsule (20 mg total) by mouth daily. 90 capsule 3  . predniSONE (DELTASONE) 10 MG tablet TAKE 3 TABLETS PO QD FOR 3 DAYS THEN TAKE 2 TABLETS PO QD FOR 3 DAYS THEN TAKE 1 TABLET PO QD FOR 3 DAYS THEN TAKE 1/2 TAB PO QD FOR 3 DAYS 20 tablet 0   No current facility-administered  medications on file prior to visit.      Objective:  Objective  Physical Exam Vitals signs and nursing note reviewed.  Constitutional:      Appearance: She is well-developed.  HENT:     Head: Normocephalic and atraumatic.  Eyes:     Conjunctiva/sclera: Conjunctivae normal.  Neck:     Musculoskeletal: Normal range of motion and neck supple.     Thyroid: No thyromegaly.     Vascular: No carotid bruit  or JVD.  Cardiovascular:     Rate and Rhythm: Normal rate and regular rhythm.     Heart sounds: Normal heart sounds. No murmur.  Pulmonary:     Effort: Pulmonary effort is normal. No respiratory distress.     Breath sounds: Normal breath sounds. No wheezing or rales.  Chest:     Chest wall: No tenderness.  Neurological:     Mental Status: She is alert and oriented to person, place, and time.    BP 118/68   Pulse 62   Ht 5' 1.5" (1.562 m)   Wt 147 lb (66.7 kg)   SpO2 95%   BMI 27.33 kg/m  Wt Readings from Last 3 Encounters:  05/08/18 147 lb (66.7 kg)  04/24/18 148 lb (67.1 kg)  03/25/18 152 lb (68.9 kg)     Lab Results  Component Value Date   WBC 7.1 04/24/2018   HGB 12.7 04/24/2018   HCT 38.0 04/24/2018   PLT 262.0 04/24/2018   GLUCOSE 96 04/24/2018   CHOL 186 02/17/2017   TRIG 122.0 02/17/2017   HDL 60.40 02/17/2017   LDLDIRECT 142.8 11/22/2010   LDLCALC 101 (H) 02/17/2017   ALT 17 04/24/2018   AST 24 04/24/2018   NA 141 04/24/2018   K 4.9 04/24/2018   CL 102 04/24/2018   CREATININE 1.14 04/24/2018   BUN 17 04/24/2018   CO2 30 04/24/2018   TSH 0.84 04/24/2018   INR 1.32 11/18/2014   HGBA1C 6.1 (H) 11/18/2014    Dg Chest 2 View  Result Date: 04/24/2018 CLINICAL DATA:  Shortness of breath and bronchospasm. EXAM: CHEST - 2 VIEW COMPARISON:  November 20, 2015 FINDINGS: The heart size and mediastinal contours are within normal limits. Increased pulmonary interstitium is identified bilaterally. Minimal bilateral pleural effusions are identified bilaterally. Small patchy opacities identified in the right lung base. The visualized skeletal structures are stable. IMPRESSION: Mild increased pulmonary interstitium bilaterally. This is nonspecific but can be seen in bronchitis. Minimal bilateral pleural effusions. Minimal opacity of the right lung base, developing early pneumoniae is not excluded. Electronically Signed   By: Abelardo Diesel M.D.   On: 04/24/2018 13:59       Assessment & Plan:  Plan  I am having Amanda [name] maintain her ICAPS, acetaminophen, aspirin EC, omeprazole, buPROPion, levothyroxine, albuterol, predniSONE, and azithromycin.  No orders of the defined types were placed in this encounter.   Problem List Items Addressed This Visit    None    Visit Diagnoses    History of community acquired pneumonia    -  Primary   Relevant Orders   DG Chest 2 View    abx finished  Recheck cxr 2-3 weeks   Follow-up: No follow-ups on file.  [name], DO

## 2018-05-14 ENCOUNTER — Encounter: Payer: Self-pay | Admitting: Family Medicine

## 2018-05-15 ENCOUNTER — Other Ambulatory Visit: Payer: Self-pay | Admitting: Family Medicine

## 2018-05-15 ENCOUNTER — Encounter: Payer: Self-pay | Admitting: Family Medicine

## 2018-05-15 DIAGNOSIS — J189 Pneumonia, unspecified organism: Secondary | ICD-10-CM

## 2018-05-15 DIAGNOSIS — E039 Hypothyroidism, unspecified: Secondary | ICD-10-CM

## 2018-05-15 MED ORDER — AZITHROMYCIN 250 MG PO TABS: ORAL_TABLET | ORAL | 0 refills | Status: DC

## 2018-05-18 ENCOUNTER — Encounter: Payer: Self-pay | Admitting: Family Medicine

## 2018-05-18 ENCOUNTER — Ambulatory Visit (HOSPITAL_BASED_OUTPATIENT_CLINIC_OR_DEPARTMENT_OTHER)
Admission: RE | Admit: 2018-05-18 | Discharge: 2018-05-18 | Disposition: A | Discharge status: Home / Self Care | Payer: Medicare Other | Source: Ambulatory Visit | Attending: Family Medicine | Admitting: Family Medicine

## 2018-05-18 ENCOUNTER — Other Ambulatory Visit: Payer: Self-pay

## 2018-05-18 ENCOUNTER — Other Ambulatory Visit: Payer: Self-pay | Admitting: Family Medicine

## 2018-05-18 DIAGNOSIS — R05 Cough: Secondary | ICD-10-CM | POA: Diagnosis not present

## 2018-05-18 DIAGNOSIS — R9389 Abnormal findings on diagnostic imaging of other specified body structures: Secondary | ICD-10-CM

## 2018-05-18 DIAGNOSIS — Z8701 Personal history of pneumonia (recurrent): Secondary | ICD-10-CM

## 2018-05-18 MED ORDER — DOXYCYCLINE HYCLATE 100 MG PO TABS: 100.0000 mg | ORAL_TABLET | Freq: Two times a day (BID) | ORAL | 0 refills | Status: DC

## 2018-05-19 ENCOUNTER — Ambulatory Visit (HOSPITAL_BASED_OUTPATIENT_CLINIC_OR_DEPARTMENT_OTHER)
Admission: RE | Admit: 2018-05-19 | Discharge: 2018-05-19 | Disposition: A | Discharge status: Home / Self Care | Payer: Medicare Other | Source: Ambulatory Visit | Attending: Family Medicine | Admitting: Family Medicine

## 2018-05-19 ENCOUNTER — Encounter: Payer: Self-pay | Admitting: Family Medicine

## 2018-05-19 ENCOUNTER — Other Ambulatory Visit: Payer: Self-pay | Admitting: Family Medicine

## 2018-05-19 ENCOUNTER — Encounter (HOSPITAL_BASED_OUTPATIENT_CLINIC_OR_DEPARTMENT_OTHER): Payer: Self-pay

## 2018-05-19 DIAGNOSIS — R9389 Abnormal findings on diagnostic imaging of other specified body structures: Secondary | ICD-10-CM | POA: Insufficient documentation

## 2018-05-19 DIAGNOSIS — J90 Pleural effusion, not elsewhere classified: Secondary | ICD-10-CM | POA: Diagnosis not present

## 2018-05-19 DIAGNOSIS — J189 Pneumonia, unspecified organism: Secondary | ICD-10-CM

## 2018-05-19 DIAGNOSIS — C8594 Non-Hodgkin lymphoma, unspecified, lymph nodes of axilla and upper limb: Secondary | ICD-10-CM

## 2018-05-19 MED ORDER — IOHEXOL 300 MG/ML  SOLN
100.0000 mL | Freq: Once | INTRAMUSCULAR | Status: AC | PRN
  Administered 2018-05-19: 80 mL via INTRAVENOUS

## 2018-05-19 NOTE — Telephone Encounter (Signed)
That is fine -to refer-- he is in the [place] office

## 2018-05-19 NOTE — Telephone Encounter (Signed)
Notes recorded by [name], DO on 05/18/2018 at 8:06 PM EDT Abnormality on cxr--- unsure what exactly it is ----? Infiltrate Will get CT chest and refer to pulmonary  Doxycycline 100 mg bid x 10 days -- I will send in

## 2018-05-20 ENCOUNTER — Encounter: Payer: Self-pay | Admitting: *Deleted

## 2018-05-20 DIAGNOSIS — R591 Generalized enlarged lymph nodes: Secondary | ICD-10-CM

## 2018-05-20 NOTE — Progress Notes (Signed)
Reached out to [name] to introduce myself as the office RN Navigator and explain our new patient process. Reviewed the reason for their referral and scheduled their new patient appointment along with labs. Provided address and directions to the office including call back phone number. Reviewed with patient any concerns they may have or any possible barriers to attending their appointment.   Informed patient about my role as a navigator and that I will meet with them prior to their New Patient appointment and more fully discuss what services I can provide. At this time patient has no further questions or needs.   Per Dr Maylon Peppers, urgent referral placed to Dr Dalbert Batman for axillary biopsy. Referral placed and patient knows this office may reach out to her to schedule.

## 2018-05-21 ENCOUNTER — Encounter: Payer: Self-pay | Admitting: Family Medicine

## 2018-05-21 ENCOUNTER — Other Ambulatory Visit: Payer: Self-pay | Admitting: Family Medicine

## 2018-05-21 ENCOUNTER — Other Ambulatory Visit: Payer: Self-pay | Admitting: Hematology

## 2018-05-21 DIAGNOSIS — R110 Nausea: Secondary | ICD-10-CM

## 2018-05-21 DIAGNOSIS — C8580 Other specified types of non-Hodgkin lymphoma, unspecified site: Secondary | ICD-10-CM | POA: Insufficient documentation

## 2018-05-21 DIAGNOSIS — Z114 Encounter for screening for human immunodeficiency virus [HIV]: Secondary | ICD-10-CM

## 2018-05-21 DIAGNOSIS — R591 Generalized enlarged lymph nodes: Secondary | ICD-10-CM

## 2018-05-21 MED ORDER — ONDANSETRON HCL 4 MG PO TABS: 4.0000 mg | ORAL_TABLET | Freq: Three times a day (TID) | ORAL | 0 refills | Status: DC | PRN

## 2018-05-21 NOTE — Progress Notes (Signed)
[place] NOTE  Patient Care Team: Carollee Herter, Alferd Apa, DO as PCP - General (Family Medicine) Jari Pigg, MD as Consulting Physician (Dermatology) Monna Fam, MD as Consulting Physician (Ophthalmology) Terrance Mass, MD (Inactive) as Consulting Physician (Gynecology) Cordelia Poche, RN as Oncology Nurse Navigator Tish Men, MD as Medical Oncologist (Hematology)  HEME/ONC OVERVIEW: 1. 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  CXR showed nonspecific findings.  Due to the persistent respiratory symptoms, CT chest was done, which showed pathologic lymphadenopathy involving bilateral axilla, mediastinum, as well as multiple small nodules and possible splenomegaly, suggestive of possible lymphoproliferative disorder. -I reviewed imaging results in detail with the patient -Given the suspicious imaging findings for possible lymphoproliferative disease, I have referred patient to Dr. Dalbert Batman of general surgery for excisional LN biopsy for tissue confirmation (appt on 05/22/2018) -I have also ordered HIV, Hep B/C serologies -In addition, I have ordered PET to assess the extent of lymphadenopathy -Based on the work-up above, we will determine if any additional testing is needed, such as bone marrow biopsy   Bronchospasm -Patient reports a few months of exertional dyspnea, usually after walking or climbing stairs; no hx of tobacco  use or reactive airway disease  -CT chest in 05/2018 showed some bronchiectasis of the right lung and non-specific groundglass opacities in the lung bases -She has albuterol nebulizer at home -I prescribed rescue albuterol inhaler PRN -Pending her work-up above, she may need referral to pulmonology for further evaluation and management of bronchiectasis    Orders Placed This Encounter  Procedures  . NM PET Image Initial (PI) Skull Base To Thigh    Standing Status:   Future    Standing Expiration Date:   05/22/2019    Order Specific Question:   If indicated for the ordered procedure, I authorize the administration of a radiopharmaceutical per Radiology protocol    Answer:   Yes    Order Specific Question:   Preferred imaging location?    Answer:   [facility]    Order Specific Question:   Radiology Contrast Protocol - do NOT remove file path    Answer:   [email or web address]   All questions were answered. The patient knows to call the clinic with any problems, questions or concerns.  Return in 2 weeks for clinic appt on the imaging and biopsy results.  Tish Men, MD 05/22/2018 10:34 AM   CHIEF COMPLAINTS/PURPOSE OF CONSULTATION:  "I am told I have cancer"  HISTORY OF PRESENTING ILLNESS:  Amanda [name] 78 y.o. female is here because of incidentally noted extensive lymphadenopathy involving the axilla and mediastinum.  Patient reports that in 03/2018, she presented to her PCP early for evaluation of persistent cough, for which she was prescribed antibiotics for presumed pneumonia.  Her cough initially improved, but it then began to worsen again, for which she returned for further evaluation.  The chest x-ray at that time reportedly showed pneumonia, and she was given an additional course of antibiotics and steroids.  She was supposed to see  her PCP last week for follow-up, but her shortness of breath and coughing got worse, for which she was sent for second  chest x-ray that was abnormal, and she was referred for CT chest, which showed extensive lymphadenopathy involving bilateral axilla, mediastinum, as well as possible splenomegaly, concerning for lymphoproliferative disease.  Patient was referred to oncology for further evaluation.  She reports that she still has intermittent cough, as well as mild exertional dyspnea, but denies any shortness of breath at rest or associated symptoms, such as chest pain, palpitation, diaphoresis, or dizziness.  She denies any constitutional symptoms.  MEDICAL HISTORY:  Past Medical History:  Diagnosis Date  . Constipation, chronic   . GERD (gastroesophageal reflux disease)    zantac  . History of blood transfusion [place]  . Hyperlipidemia   . Hypothyroidism   . Macular degeneration 2013   Both eyes   . Osteopenia   . Pneumonia   . PONV (postoperative nausea and vomiting)    needs little anesthesia  . Shingles   . Shortness of breath    on exertion  . SUI (stress urinary incontinence, female)     SURGICAL HISTORY: Past Surgical History:  Procedure Laterality Date  . BREAST EXCISIONAL BIOPSY Left 1980  . CARPAL TUNNEL RELEASE  1999  . CATARACT EXTRACTION  2009, 2011   BOTH EYES  . [place]  . COLONOSCOPY      Dr Cristina Gong  . DILATION AND CURETTAGE OF UTERUS     X2  . HYSTEROSCOPY W/D&C  01/07/2012   Procedure: DILATATION AND CURETTAGE /HYSTEROSCOPY;  Surgeon: Terrance Mass, MD;  Location: [facility];  Service: Gynecology;  Laterality: N/A;  intrauterine foley catheter for tamponode   . TONSILLECTOMY AND ADENOIDECTOMY    . TUBAL LIGATION     BY LAPAROSCOPY  . WISDOM TOOTH EXTRACTION      SOCIAL HISTORY: Social History   Socioeconomic History  . Marital status: Married    Spouse name: Not on file  . Number of children: Not on file  . Years of education: Not on file  . Highest education level: Not on file  Occupational History  . Not on file  Social Needs  .  Financial resource strain: Not on file  . Food insecurity:    Worry: Not on file    Inability: Not on file  . Transportation needs:    Medical: Not on file    Non-medical: Not on file  Tobacco Use  . Smoking status: Never Smoker  . Smokeless tobacco: Never Used  Substance and Sexual Activity  . Alcohol use: Yes    Alcohol/week: 0.0 standard drinks    Comment: Red wine occasionally  . Drug use: No  . Sexual activity: Never    Birth control/protection: Post-menopausal  Lifestyle  . Physical activity:    Days per week: Not on file    Minutes per session: Not on file  . Stress: Not on file  Relationships  . Social connections:    Talks on phone: Not on file    Gets together: Not on file    Attends religious service: Not on file    Active member of club or organization: Not on file    Attends meetings of clubs or organizations: Not on file    Relationship status: Not on file  . Intimate partner violence:    Fear of current or ex partner: Not on file    Emotionally abused: Not on  file    Physically abused: Not on file    Forced sexual activity: Not on file  Other Topics Concern  . Not on file  Social History Narrative  . Not on file    FAMILY HISTORY: Family History  Problem Relation Age of Onset  . Ovarian cancer Mother   . Breast cancer Mother 22  . Hypertension Father   . Prostate cancer Father   . Kidney failure Father   . Diabetes Father   . Hyperlipidemia Brother   . COPD Paternal Grandfather   . Stroke Maternal Grandfather     ALLERGIES:  is allergic to phenergan [promethazine hcl]; levofloxacin; and pravastatin sodium.  MEDICATIONS:  Current Outpatient Medications  Medication Sig Dispense Refill  . acetaminophen (TYLENOL) 500 MG tablet Take 1,000 mg by mouth every 6 (six) hours as needed for mild pain.    [name] albuterol (PROVENTIL HFA;VENTOLIN HFA) 108 (90 Base) MCG/ACT inhaler Inhale 2 puffs into the lungs every 6 (six) hours as needed for up to 30 days  for wheezing or shortness of breath. 1 Inhaler 2  . albuterol (PROVENTIL) (2.5 MG/3ML) 0.083% nebulizer solution Take 3 mLs (2.5 mg total) by nebulization every 6 (six) hours as needed for wheezing or shortness of breath. 75 mL 12  . aspirin EC 81 MG tablet Take 1 tablet (81 mg total) by mouth daily.    [name] buPROPion (WELLBUTRIN XL) 150 MG 24 hr tablet TAKE 1 TABLET BY MOUTH EVERY DAY 90 tablet 0  . levothyroxine (SYNTHROID, LEVOTHROID) 125 MCG tablet TAKE 1 TABLET BY MOUTH DAILY 90 tablet 0  . Multiple Vitamins-Minerals (ICAPS) TABS Take 2 tablets by mouth daily. AREDS 1+2    . omeprazole (PRILOSEC) 20 MG capsule Take 1 capsule (20 mg total) by mouth daily. 90 capsule 3  . ondansetron (ZOFRAN) 4 MG tablet Take 1 tablet (4 mg total) by mouth every 8 (eight) hours as needed for nausea or vomiting. 20 tablet 0  . predniSONE (DELTASONE) 10 MG tablet TAKE 3 TABLETS PO QD FOR 3 DAYS THEN TAKE 2 TABLETS PO QD FOR 3 DAYS THEN TAKE 1 TABLET PO QD FOR 3 DAYS THEN TAKE 1/2 TAB PO QD FOR 3 DAYS 20 tablet 0   No current facility-administered medications for this visit.     REVIEW OF SYSTEMS:   Constitutional: ( - ) fevers, ( - )  chills , ( - ) night sweats Eyes: ( - ) blurriness of vision, ( - ) double vision, ( - ) watery eyes Ears, nose, mouth, throat, and face: ( - ) mucositis, ( - ) sore throat Respiratory: ( + ) cough, ( + ) dyspnea, ( - ) wheezes Cardiovascular: ( - ) palpitation, ( - ) chest discomfort, ( - ) lower extremity swelling Gastrointestinal:  ( - ) nausea, ( - ) heartburn, ( - ) change in bowel habits Skin: ( - ) abnormal skin rashes Lymphatics: ( - ) new lymphadenopathy, ( - ) easy bruising Neurological: ( - ) numbness, ( - ) tingling, ( - ) new weaknesses Behavioral/Psych: ( - ) mood change, ( - ) new changes  All other systems were reviewed with the patient and are negative.  PHYSICAL EXAMINATION: ECOG PERFORMANCE STATUS: 1 - Symptomatic but completely ambulatory  Vitals:    05/22/18 0941  BP: 133/76  Pulse: 90  Resp: 18  Temp: 98.7 F (37.1 C)  SpO2: 95%   Filed Weights   05/22/18 0941  Weight: 148 lb (67.1 kg)  GENERAL: alert, no distress and comfortable SKIN: skin color, texture, turgor are normal, no rashes or significant lesions EYES: conjunctiva are pink and non-injected, sclera clear OROPHARYNX: no exudate, no erythema; lips, buccal mucosa, and tongue normal  NECK: supple, non-tender LYMPH:  a small left cervical LN <1cm, palpable left axillary adenopathy   LUNGS: clear to auscultation with normal breathing effort HEART: regular rate & rhythm, no murmurs, no lower extremity edema ABDOMEN: soft, non-tender, non-distended, normal bowel sounds Musculoskeletal: no cyanosis of digits and no clubbing  PSYCH: alert & oriented x 3, fluent speech NEURO: no focal motor/sensory deficits  LABORATORY DATA:  I have reviewed the data as listed Lab Results  Component Value Date   WBC 8.2 05/22/2018   HGB 12.0 05/22/2018   HCT 37.3 05/22/2018   MCV 81.8 05/22/2018   PLT 249 05/22/2018   Lab Results  Component Value Date   NA 140 05/22/2018   K 4.1 05/22/2018   CL 100 05/22/2018   CO2 28 05/22/2018    RADIOGRAPHIC STUDIES: I have personally reviewed the radiological images as listed and agreed with the findings in the report. Dg Chest 2 View  Result Date: 05/18/2018 CLINICAL DATA:  Cough and chest congestion. History of community acquired pneumonia. EXAM: CHEST - 2 VIEW COMPARISON:  Chest x-rays dated 04/24/2018 and 11/20/2015 FINDINGS: The heart size and pulmonary vascularity are normal. There is further increase in the interstitial markings diffusely, most prominent at the lung bases. There are tiny bilateral pleural effusions. There is fullness in the region of the aortopulmonary window which was not present on the prior chest CT in 2017. On the lateral view there is increased density in the right middle lobe medially and also increased density  superiorly and anteriorly on the lateral view which could represent a mass or infiltrate. IMPRESSION: 1. Increased interstitial accentuation of possible mass or infiltrate in the region of the aortopulmonary window and chronic inflammatory changes in the right middle lobe. 2. Tiny bilateral effusions, unchanged. Electronically Signed   By: Lorriane Shire M.D.   On: 05/18/2018 17:26   Dg Chest 2 View  Result Date: 04/24/2018 CLINICAL DATA:  Shortness of breath and bronchospasm. EXAM: CHEST - 2 VIEW COMPARISON:  November 20, 2015 FINDINGS: The heart size and mediastinal contours are within normal limits. Increased pulmonary interstitium is identified bilaterally. Minimal bilateral pleural effusions are identified bilaterally. Small patchy opacities identified in the right lung base. The visualized skeletal structures are stable. IMPRESSION: Mild increased pulmonary interstitium bilaterally. This is nonspecific but can be seen in bronchitis. Minimal bilateral pleural effusions. Minimal opacity of the right lung base, developing early pneumoniae is not excluded. Electronically Signed   By: Abelardo Diesel M.D.   On: 04/24/2018 13:59   Ct Chest W Contrast  Result Date: 05/19/2018 CLINICAL DATA:  78 year old female with cough and shortness of breath EXAM: CT CHEST WITH CONTRAST TECHNIQUE: Multidetector CT imaging of the chest was performed during intravenous contrast administration. CONTRAST:  13m OMNIPAQUE IOHEXOL 300 MG/ML  SOLN COMPARISON:  Chest x-ray 05/18/2018, CT 11/20/2015, 11/23/2014 FINDINGS: Cardiovascular: Heart size within normal limits. Trace pericardial fluid/thickening. Minimal aortic valve calcifications. No significant coronary calcifications. Course caliber and contour of the thoracic aorta unremarkable with no dissection or aneurysm. Branch vessels are patent. Main pulmonary artery diameter within normal limits. No filling defects within the lobar, segmental, or proximal subsegmental vessels.  Mediastinum/Nodes: Extensive bulky lymphadenopathy through the mediastinum at all nodal stations, including bilateral hilar regions, new from the comparison  studies. Bilateral bulky axillary adenopathy. Unremarkable appearance of the thoracic esophagus. Lungs/Pleura: Bilateral pleural effusions. Multiple bilateral small pulmonary nodules throughout all lobes. Bronchiectasis of the right middle lobe with partial volume loss/atelectasis, worsening from the comparison. Centrilobular ground-glass nodularity of the bilateral lung bases. Mild interlobular septal thickening. Upper Abdomen: The diameter of the spleen measures greater than 11 cm, which measures enlarged compared to the available imaging of the upper abdomen from prior CT studies. Musculoskeletal: No acute displaced fracture. Mild degenerative changes of the thoracic spine. IMPRESSION: Pathologic lymphadenopathy of the bilateral axillary regions and the mediastinum, compatible with lymphoma. Referral for oncologic evaluation recommended. Multiple small lung nodules, with a pattern of centrilobular nodularity at the lung bases, and mild interlobular septal thickening. The differential includes sequela of lymphoproliferative disorder, edema, and/or atypical infection. Small bilateral pleural effusions. Suggestion of splenomegaly, supporting lymphoma or other lymphoproliferative disorder as the primary diagnosis. Complete abdominal imaging may be useful. Electronically Signed   By: Corrie Mckusick D.O.   On: 05/19/2018 14:21    PATHOLOGY: I have reviewed the pathology reports as documented in the oncologist history.

## 2018-05-22 ENCOUNTER — Encounter: Payer: Self-pay | Admitting: Hematology

## 2018-05-22 ENCOUNTER — Encounter: Payer: Self-pay | Admitting: *Deleted

## 2018-05-22 ENCOUNTER — Inpatient Hospital Stay (HOSPITAL_BASED_OUTPATIENT_CLINIC_OR_DEPARTMENT_OTHER): Payer: Medicare Other | Admitting: Hematology

## 2018-05-22 ENCOUNTER — Other Ambulatory Visit: Payer: Self-pay | Admitting: General Surgery

## 2018-05-22 ENCOUNTER — Inpatient Hospital Stay: Payer: Medicare Other | Attending: Hematology

## 2018-05-22 ENCOUNTER — Other Ambulatory Visit: Payer: Self-pay

## 2018-05-22 VITALS — BP 133/76 | HR 90 | Temp 98.7°F | Resp 18 | Wt 148.0 lb

## 2018-05-22 DIAGNOSIS — Z114 Encounter for screening for human immunodeficiency virus [HIV]: Secondary | ICD-10-CM

## 2018-05-22 DIAGNOSIS — J9801 Acute bronchospasm: Secondary | ICD-10-CM | POA: Diagnosis not present

## 2018-05-22 DIAGNOSIS — R590 Localized enlarged lymph nodes: Secondary | ICD-10-CM

## 2018-05-22 DIAGNOSIS — R630 Anorexia: Secondary | ICD-10-CM | POA: Diagnosis not present

## 2018-05-22 DIAGNOSIS — R918 Other nonspecific abnormal finding of lung field: Secondary | ICD-10-CM | POA: Diagnosis not present

## 2018-05-22 DIAGNOSIS — C8580 Other specified types of non-Hodgkin lymphoma, unspecified site: Secondary | ICD-10-CM | POA: Insufficient documentation

## 2018-05-22 DIAGNOSIS — R591 Generalized enlarged lymph nodes: Secondary | ICD-10-CM

## 2018-05-22 DIAGNOSIS — Z803 Family history of malignant neoplasm of breast: Secondary | ICD-10-CM | POA: Diagnosis not present

## 2018-05-22 DIAGNOSIS — Z98891 History of uterine scar from previous surgery: Secondary | ICD-10-CM | POA: Diagnosis not present

## 2018-05-22 DIAGNOSIS — Z8619 Personal history of other infectious and parasitic diseases: Secondary | ICD-10-CM | POA: Diagnosis not present

## 2018-05-22 LAB — CBC WITH DIFFERENTIAL (CANCER CENTER ONLY)
Abs Immature Granulocytes: 0.11 10*3/uL — ABNORMAL HIGH (ref 0.00–0.07)
Basophils Absolute: 0.1 10*3/uL (ref 0.0–0.1)
Basophils Relative: 1 %
Eosinophils Absolute: 0.3 10*3/uL (ref 0.0–0.5)
Eosinophils Relative: 3 %
HCT: 37.3 % (ref 36.0–46.0)
Hemoglobin: 12 g/dL (ref 12.0–15.0)
Immature Granulocytes: 1 %
Lymphocytes Relative: 20 %
Lymphs Abs: 1.6 10*3/uL (ref 0.7–4.0)
MCH: 26.3 pg (ref 26.0–34.0)
MCHC: 32.2 g/dL (ref 30.0–36.0)
MCV: 81.8 fL (ref 80.0–100.0)
Monocytes Absolute: 1.2 10*3/uL — ABNORMAL HIGH (ref 0.1–1.0)
Monocytes Relative: 15 %
Neutro Abs: 4.9 10*3/uL (ref 1.7–7.7)
Neutrophils Relative %: 60 %
Platelet Count: 249 10*3/uL (ref 150–400)
RBC: 4.56 MIL/uL (ref 3.87–5.11)
RDW: 14.6 % (ref 11.5–15.5)
WBC Count: 8.2 10*3/uL (ref 4.0–10.5)
nRBC: 0 % (ref 0.0–0.2)

## 2018-05-22 LAB — CMP (CANCER CENTER ONLY)
ALK PHOS: 86 U/L (ref 38–126)
ALT: 12 U/L (ref 0–44)
AST: 19 U/L (ref 15–41)
Albumin: 4.2 g/dL (ref 3.5–5.0)
Anion gap: 12 (ref 5–15)
BUN: 17 mg/dL (ref 8–23)
CO2: 28 mmol/L (ref 22–32)
Calcium: 10.7 mg/dL — ABNORMAL HIGH (ref 8.9–10.3)
Chloride: 100 mmol/L (ref 98–111)
Creatinine: 1.26 mg/dL — ABNORMAL HIGH (ref 0.44–1.00)
GFR, Est AFR Am: 47 mL/min — ABNORMAL LOW (ref >60)
GFR, Estimated: 40 mL/min — ABNORMAL LOW (ref >60)
Glucose, Bld: 88 mg/dL (ref 70–99)
Potassium: 4.1 mmol/L (ref 3.5–5.1)
Sodium: 140 mmol/L (ref 135–145)
Total Bilirubin: 0.4 mg/dL (ref 0.3–1.2)
Total Protein: 6.6 g/dL (ref 6.5–8.1)

## 2018-05-22 LAB — LACTATE DEHYDROGENASE: LDH: 237 U/L — ABNORMAL HIGH (ref 98–192)

## 2018-05-22 LAB — URIC ACID: Uric Acid, Serum: 7.1 mg/dL (ref 2.5–7.1)

## 2018-05-22 MED ORDER — ALBUTEROL SULFATE HFA 108 (90 BASE) MCG/ACT IN AERS: 2.0000 | INHALATION_SPRAY | Freq: Four times a day (QID) | RESPIRATORY_TRACT | 2 refills | Status: DC | PRN

## 2018-05-22 NOTE — Progress Notes (Signed)
..  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 [name] patient "Your Patient Navigator" handout which explains my role, areas in which I am able to help, and all the contact information for myself and the office. Also gave patient MD and Navigator business card. Reviewed with patient the general overview of expected course after initial diagnosis and time frame for all steps to be completed.  Patient completed visit with Dr. Maylon Peppers  Revisited with patient after MD visit. Patient will need  - Referral to Dr Dalbert Batman - appointment scheduled 05/22/2018 at 4pm - PET Scan - 3/27/20202 at 7:30a  Patient understands all follow up procedures and expectations. They have my number to reach out for any further clarification or additional needs. Will call patient in 5-7 days to see if any further needs have presented, or if patient has any further questions or needs.   Also gave a short tour of the infusion room and office.

## 2018-05-23 LAB — HEPATITIS B SURFACE ANTIGEN: Hepatitis B Surface Ag: NEGATIVE

## 2018-05-23 LAB — HIV ANTIBODY (ROUTINE TESTING W REFLEX): HIV SCREEN 4TH GENERATION: NONREACTIVE

## 2018-05-23 LAB — HCV COMMENT:

## 2018-05-23 LAB — HEPATITIS C ANTIBODY (REFLEX): HCV Ab: <0.1 s/co ratio (ref 0.0–0.9)

## 2018-05-23 LAB — HEPATITIS B CORE ANTIBODY, TOTAL: Hep B Core Total Ab: NEGATIVE

## 2018-05-23 LAB — HEPATITIS B SURFACE ANTIBODY,QUALITATIVE: Hep B S Ab: NONREACTIVE

## 2018-05-24 ENCOUNTER — Other Ambulatory Visit: Payer: Self-pay | Admitting: Family Medicine

## 2018-05-24 ENCOUNTER — Encounter: Payer: Self-pay | Admitting: Family Medicine

## 2018-05-24 DIAGNOSIS — B370 Candidal stomatitis: Secondary | ICD-10-CM

## 2018-05-25 ENCOUNTER — Other Ambulatory Visit: Payer: Self-pay | Admitting: Family Medicine

## 2018-05-25 ENCOUNTER — Encounter (HOSPITAL_COMMUNITY): Payer: Self-pay | Admitting: *Deleted

## 2018-05-25 ENCOUNTER — Other Ambulatory Visit: Payer: Self-pay

## 2018-05-25 ENCOUNTER — Encounter: Payer: Self-pay | Admitting: Family Medicine

## 2018-05-25 DIAGNOSIS — K121 Other forms of stomatitis: Secondary | ICD-10-CM

## 2018-05-25 MED ORDER — MAGIC MOUTHWASH W/LIDOCAINE: 5.0000 mL | Freq: Four times a day (QID) | ORAL | 0 refills | Status: DC | PRN

## 2018-05-25 NOTE — Telephone Encounter (Signed)
rx printed

## 2018-05-25 NOTE — Progress Notes (Signed)
[facility]

## 2018-05-25 NOTE — Progress Notes (Signed)
Pt denies any acute cardiopulmonary issues. Pt denies being under the care of a cardiologist.Pt denies having a cardiac cath. Pt denies having an EKG within the last year. Pt made aware to stop taking vitamins, fish oil  and herbal medications. Do not take any NSAIDs ie: Ibuprofen, Advil, Naproxen (Aleve), Motrin, BC and Goody Powder. Pt denies that spouse [name] has traveled internationally in the last 30 days. Pt denies that spouse has traveled outside of [place] in the last 14 days. Pt denies that spouse has been exposed or tested positive to COVID-19. Pt denies that spouse has a cough, runny nose, cold, congestion, fever >100.4, sore throat, SOB, N/V and diarrhea. Pt verbalized understanding of all pre-op instructions. PA,  Anesthesiology, asked to review pt history.

## 2018-05-25 NOTE — H&P (Signed)
Amanda [name]  Location: [facility] Patient #: [id number] DOB: February 20, 1941 Married / Language: English / Race: White Female       History of Present Illness     This is a 78 year old woman, referred by Dr. Maylon Peppers at [place] for evaluation of lymphadenopathy, suspected lymphoma. Her husband is with her throughout the encounter.is her PCP      She was treated for legionnaires disease in 2015. She had a CT scan of her chest in 2017 which showed some infiltrative process. Never a specific diagnosis. She has never seen a pulmonary medicine doctor but does have an appointment on March 31. She's been treated for pneumonia twice this year starting in January. She still has a cough but no sputum production. No fever or chills. She has some shortness of breath and stopped playing her clarinet 3 weeks ago. A CT scan of the chest was performed on March 17 and surprisingly, this showed axillary adenopathy, mediastinal adenopathy, and mild splenomegaly. There are some inflammatory changes in the right middle lobe and tiny pleural effusions unchanged. This seems like a chronic pulmonary problem and not an acute viral illness.CBC is normal. LDH 237. Calcium 10.7. Creatinine 1.26. Hepatitis serologies, HIV screen, and PET scan are pending      Past history reveals legionnaires disease 2015. Hyperlipidemia. Hypothyroidism. Remote shingles. GERD. C-section. Cataract surgery. Breast biopsy. Family history reveals mother was treated for breast cancer and ovarian cancer. Father was treated for prostate cancer Social history this is a second marriage for both. She has 2 children from a previous marriage. He has 4 children from a previous marriage. She is a retired Forensic psychologist. Denies tobacco ever. Occasional alcohol.      On exam the most prominent obvious adenopathy is left axillary and that will be our target. Her spleen may be palpable. I recommended  that we proceed with excision of deep left axillary lymph node in OR early next week and she agrees. She can have the PET scan after the surgery and she can see pulmonary medicine after the surgery. I discussed the indications, details, techniques, and numerous risk of the surgery with her and her husband. She is aware the risk of bleeding, infection, seroma formation, arm swelling, arm numbness, shoulder disability. She understands all of these issues. All of her questions were answered. She agrees with this plan.   Past Surgical History  Breast Biopsy  Left. Cataract Surgery  Bilateral. Cesarean Section - 1  Tonsillectomy   Diagnostic Studies History  Colonoscopy  5-10 years ago Mammogram  within last year Pap Smear  1-5 years ago  Allergies Phenergan *ANTIHISTAMINES*  Nausea, Vomiting. levoFLOXacin *CHEMICALS*  Nausea, Vomiting. Pravastatin Sodium *ANTIHYPERLIPIDEMICS*  Nausea, Vomiting. Allergies Reconciled   Medication History  Levothyroxine Sodium (125MCG Tablet, Oral) Active. buPROPion HCl ER (XL) (150MG  Tablet ER 24HR, Oral) Active. Omeprazole (20MG  Capsule DR, Oral) Active. Multi Vitamin (Oral) Active. Iron (Oral) Specific strength unknown - Active. Tylenol (325MG  Tablet, Oral) Active. Medications Reconciled  Social History Alcohol use  Occasional alcohol use. Caffeine use  Coffee, Tea. No drug use  Tobacco use  Never smoker.  Family History Alcohol Abuse  Son. Arthritis  Brother, Daughter, Father. Breast Cancer  Mother. Cerebrovascular Accident  Brother. Depression  Son. Diabetes Mellitus  Father. Hypertension  Brother, Father. Kidney Disease  Father. Ovarian Cancer  Mother. Prostate Cancer  Brother, Father. Thyroid problems  Father.  Pregnancy / Birth History Age at menarche  50 years.  Age of menopause  69-50 Gravida  4 Maternal age  78-20 Para  3  Other Problems Thyroid Disease     Review of  Systems General Present- Appetite Loss and Fatigue. Not Present- Chills, Fever, Night Sweats, Weight Gain and Weight Loss. Skin Not Present- Change in Wart/Mole, Dryness, Hives, Jaundice, New Lesions, Non-Healing Wounds, Rash and Ulcer. HEENT Present- Seasonal Allergies and Wears glasses/contact lenses. Not Present- Earache, Hearing Loss, Hoarseness, Nose Bleed, Oral Ulcers, Ringing in the Ears, Sinus Pain, Sore Throat, Visual Disturbances and Yellow Eyes. Respiratory Present- Chronic Cough, Difficulty Breathing and Wheezing. Not Present- Bloody sputum and Snoring. Breast Not Present- Breast Mass, Breast Pain, Nipple Discharge and Skin Changes. Cardiovascular Present- Shortness of Breath. Not Present- Chest Pain, Difficulty Breathing Lying Down, Leg Cramps, Palpitations, Rapid Heart Rate and Swelling of Extremities. Gastrointestinal Present- Abdominal Pain, Bloating, Gets full quickly at meals and Nausea. Not Present- Bloody Stool, Change in Bowel Habits, Chronic diarrhea, Constipation, Difficulty Swallowing, Excessive gas, Hemorrhoids, Indigestion, Rectal Pain and Vomiting. Female Genitourinary Not Present- Frequency, Nocturia, Painful Urination, Pelvic Pain and Urgency. Musculoskeletal Present- Back Pain and Muscle Pain. Not Present- Joint Pain, Joint Stiffness, Muscle Weakness and Swelling of Extremities. Neurological Present- Weakness. Not Present- Decreased Memory, Fainting, Headaches, Numbness, Seizures, Tingling, Tremor and Trouble walking. Psychiatric Not Present- Anxiety, Bipolar, Change in Sleep Pattern, Depression, Fearful and Frequent crying. Endocrine Not Present- Cold Intolerance, Excessive Hunger, Hair Changes, Heat Intolerance, Hot flashes and New Diabetes. Hematology Not Present- Blood Thinners, Easy Bruising, Excessive bleeding, Gland problems, HIV and Persistent Infections.  Vitals Weight: 147.6 lb Height: 61.5in Body Surface Area: 1.67 m Body Mass Index: 27.44 kg/m   Temp.: 98.53F(Temporal)  Pulse: 92 (Regular)  P.OX: 98% (Room air) BP: 120/76 (Sitting, Right Arm, Standard)     Physical Exam General Mental Status-Alert. General Appearance-Consistent with stated age. Hydration-Well hydrated. Voice-Normal.  Head and Neck Head-normocephalic, atraumatic with no lesions or palpable masses. Trachea-midline. Thyroid Gland Characteristics - normal size and consistency.  Eye Eyeball - Bilateral-Extraocular movements intact. Sclera/Conjunctiva - Bilateral-No scleral icterus.  Chest and Lung Exam Chest and lung exam reveals -quiet, even and easy respiratory effort with no use of accessory muscles and on auscultation, normal breath sounds, no adventitious sounds and normal vocal resonance. Inspection Chest Wall - Normal. Back - normal. Note: Despite her imaging findings and symptoms, her lungs sound clear. No rhonchi wheezes or rales. Does not appear dyspneic or using any accessory muscles. She does cough from time to time. No sputum production   Cardiovascular Cardiovascular examination reveals -normal heart sounds, regular rate and rhythm with no murmurs and normal pedal pulses bilaterally.  Abdomen Inspection Inspection of the abdomen reveals - No Hernias. Skin - Scar - Note: Lower midline scar well-healed. Palpation/Percussion Palpation and Percussion of the abdomen reveal - Soft, Non Tender, No Rebound tenderness and No Rigidity (guarding). Note: Liver does not appear enlarged. I think I can just barely feel the spleen tip no bowel mass or tenderness. Auscultation Auscultation of the abdomen reveals - Bowel sounds normal.  Neurologic Neurologic evaluation reveals -alert and oriented x 3 with no impairment of recent or remote memory. Mental Status-Normal.  Musculoskeletal Normal Exam - Left-Upper Extremity Strength Normal and Lower Extremity Strength Normal. Normal Exam - Right-Upper Extremity Strength  Normal and Lower Extremity Strength Normal.  Lymphatic Note: In the neck there is a very small node or submandibular gland on the left. No cervical or supraclavicular adenopathy. In the left axilla there are a couple of  obvious palpable mobile nontender lymph nodes. 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 Maylon Peppers has begun your workup and has requested a lymph node biopsy. I completely agree that this is the next step  you will be scheduled for excision of deep left axillary lymph nodes at the first opportunity This will likely be done early next week I discussed the indications, techniques, and risk of the surgery with you and your husband in detail  PULMONARY INFILTRATES ON CXR (R91.8) HISTORY OF LEGIONNAIRE'S DISEASE (Z86.19) HISTORY OF C-SECTION (I69.629) FAMILY HISTORY OF BREAST CANCER IN MOTHER (Z80.3)    Edsel Petrin. Dalbert Batman, M.D., [facility] General and Minimally invasive Surgery Breast and Colorectal Surgery Office:   [phone] Pager:   [phone]

## 2018-05-25 NOTE — Anesthesia Preprocedure Evaluation (Addendum)
Anesthesia Evaluation  Patient identified by MRN, date of birth, ID band Patient awake    Reviewed: Allergy & Precautions, NPO status , Patient's Chart, lab work & pertinent test results  History of Anesthesia Complications (+) PONV and history of anesthetic complications  Airway Mallampati: II  TM Distance: >3 FB Neck ROM: Full    Dental  (+) Teeth Intact   Pulmonary shortness of breath,    breath sounds clear to auscultation       Cardiovascular negative cardio ROS   Rhythm:Regular     Neuro/Psych  Neuromuscular disease negative psych ROS   GI/Hepatic GERD  Medicated and Controlled,  Endo/Other  Hypothyroidism   Renal/GU      Musculoskeletal  (+) Arthritis ,   Abdominal   Peds  Hematology  (+) anemia ,   Anesthesia Other Findings   Reproductive/Obstetrics                             Anesthesia Physical Anesthesia Plan  ASA: II  Anesthesia Plan: General   Post-op Pain Management:    Induction: Intravenous  PONV Risk Score and Plan: 4 or greater and Ondansetron and Dexamethasone  Airway Management Planned: LMA and Oral ETT  Additional Equipment: None  Intra-op Plan:   Post-operative Plan: Extubation in OR  Informed Consent: I have reviewed the patients History and Physical, chart, labs and discussed the procedure including the risks, benefits and alternatives for the proposed anesthesia with the patient or authorized representative who has indicated his/her understanding and acceptance.     Dental advisory given  Plan Discussed with: CRNA and Surgeon  Anesthesia Plan Comments: (Patient has been treated for community-acquired pneumonia by her PCP since 04/2018.  CXR showed nonspecific findings.  Due to the persistent respiratory symptoms, CT chest was done, which showed pathologic lymphadenopathy involving bilateral axilla, mediastinum, as well as multiple small nodules  and possible splenomegaly, suggestive of possible lymphoproliferative disorder.  -TTE 11/24/2014: EF 55-60%, normal wall motion, grade 1 dd, no significant valvular abnormalities -Nuclear stress 12/17/2010:  Probably normal stress nuclear study with a small nonreversible apical defect suggestive of soft tissue attenuation but no ischemia)       Anesthesia Quick Evaluation

## 2018-05-26 ENCOUNTER — Ambulatory Visit (HOSPITAL_COMMUNITY)
Admission: RE | Admit: 2018-05-26 | Discharge: 2018-05-26 | Disposition: A | Discharge status: Home / Self Care | Payer: Medicare Other | Attending: General Surgery | Admitting: General Surgery

## 2018-05-26 ENCOUNTER — Ambulatory Visit (HOSPITAL_COMMUNITY): Payer: Medicare Other | Admitting: Physician Assistant

## 2018-05-26 ENCOUNTER — Other Ambulatory Visit: Payer: Self-pay

## 2018-05-26 ENCOUNTER — Encounter (HOSPITAL_COMMUNITY): Payer: Self-pay

## 2018-05-26 ENCOUNTER — Encounter (HOSPITAL_COMMUNITY)
Admission: RE | Disposition: A | Discharge status: Home / Self Care | Payer: Self-pay | Source: Home / Self Care | Attending: General Surgery

## 2018-05-26 DIAGNOSIS — C8514 Unspecified B-cell lymphoma, lymph nodes of axilla and upper limb: Secondary | ICD-10-CM | POA: Diagnosis not present

## 2018-05-26 DIAGNOSIS — E039 Hypothyroidism, unspecified: Secondary | ICD-10-CM | POA: Diagnosis not present

## 2018-05-26 DIAGNOSIS — K219 Gastro-esophageal reflux disease without esophagitis: Secondary | ICD-10-CM | POA: Diagnosis not present

## 2018-05-26 DIAGNOSIS — C8580 Other specified types of non-Hodgkin lymphoma, unspecified site: Secondary | ICD-10-CM | POA: Diagnosis present

## 2018-05-26 DIAGNOSIS — Z79890 Hormone replacement therapy (postmenopausal): Secondary | ICD-10-CM | POA: Diagnosis not present

## 2018-05-26 DIAGNOSIS — D649 Anemia, unspecified: Secondary | ICD-10-CM | POA: Insufficient documentation

## 2018-05-26 DIAGNOSIS — E785 Hyperlipidemia, unspecified: Secondary | ICD-10-CM | POA: Diagnosis not present

## 2018-05-26 DIAGNOSIS — R591 Generalized enlarged lymph nodes: Secondary | ICD-10-CM

## 2018-05-26 DIAGNOSIS — Z8619 Personal history of other infectious and parasitic diseases: Secondary | ICD-10-CM | POA: Insufficient documentation

## 2018-05-26 DIAGNOSIS — R599 Enlarged lymph nodes, unspecified: Secondary | ICD-10-CM | POA: Diagnosis not present

## 2018-05-26 DIAGNOSIS — C8590 Non-Hodgkin lymphoma, unspecified, unspecified site: Secondary | ICD-10-CM | POA: Diagnosis not present

## 2018-05-26 DIAGNOSIS — Z79899 Other long term (current) drug therapy: Secondary | ICD-10-CM | POA: Insufficient documentation

## 2018-05-26 HISTORY — DX: Unspecified osteoarthritis, unspecified site: M19.90

## 2018-05-26 HISTORY — DX: Cardiac murmur, unspecified: R01.1

## 2018-05-26 HISTORY — DX: Presence of spectacles and contact lenses: Z97.3

## 2018-05-26 HISTORY — PX: LYMPH NODE BIOPSY: SHX201

## 2018-05-26 HISTORY — DX: Neoplasm of uncertain behavior of lymphoid, hematopoietic and related tissue, unspecified: D47.9

## 2018-05-26 HISTORY — DX: Anemia, unspecified: D64.9

## 2018-05-26 HISTORY — DX: Splenomegaly, not elsewhere classified: R16.1

## 2018-05-26 SURGERY — LYMPH NODE BIOPSY
Anesthesia: General | Site: Axilla | Laterality: Left

## 2018-05-26 MED ORDER — SODIUM CHLORIDE 0.9% FLUSH: 3.0000 mL | INTRAVENOUS | Status: DC | PRN

## 2018-05-26 MED ORDER — LACTATED RINGERS IV SOLN: INTRAVENOUS | Status: DC

## 2018-05-26 MED ORDER — ACETAMINOPHEN 325 MG PO TABS: 650.0000 mg | ORAL_TABLET | ORAL | Status: DC | PRN

## 2018-05-26 MED ORDER — PHENYLEPHRINE 40 MCG/ML (10ML) SYRINGE FOR IV PUSH (FOR BLOOD PRESSURE SUPPORT)
PREFILLED_SYRINGE | INTRAVENOUS | Status: DC | PRN
  Administered 2018-05-26: 60 ug via INTRAVENOUS

## 2018-05-26 MED ORDER — OXYCODONE HCL 5 MG PO TABS: 5.0000 mg | ORAL_TABLET | ORAL | Status: DC | PRN

## 2018-05-26 MED ORDER — SUGAMMADEX SODIUM 200 MG/2ML IV SOLN
INTRAVENOUS | Status: DC | PRN
  Administered 2018-05-26: 150 mg via INTRAVENOUS

## 2018-05-26 MED ORDER — ACETAMINOPHEN 650 MG RE SUPP: 650.0000 mg | RECTAL | Status: DC | PRN

## 2018-05-26 MED ORDER — DEXAMETHASONE SODIUM PHOSPHATE 10 MG/ML IJ SOLN
INTRAMUSCULAR | Status: AC
  Filled 2018-05-26: qty 1

## 2018-05-26 MED ORDER — BUPIVACAINE-EPINEPHRINE 0.25% -1:200000 IJ SOLN
INTRAMUSCULAR | Status: DC | PRN
  Administered 2018-05-26: 4 mL

## 2018-05-26 MED ORDER — LIDOCAINE 2% (20 MG/ML) 5 ML SYRINGE
INTRAMUSCULAR | Status: DC | PRN
  Administered 2018-05-26: 60 mg via INTRAVENOUS

## 2018-05-26 MED ORDER — DEXAMETHASONE SODIUM PHOSPHATE 10 MG/ML IJ SOLN
INTRAMUSCULAR | Status: DC | PRN
  Administered 2018-05-26: 10 mg via INTRAVENOUS

## 2018-05-26 MED ORDER — CHLORHEXIDINE GLUCONATE CLOTH 2 % EX PADS: 6.0000 | MEDICATED_PAD | Freq: Once | CUTANEOUS | Status: DC

## 2018-05-26 MED ORDER — BUPIVACAINE-EPINEPHRINE (PF) 0.25% -1:200000 IJ SOLN
INTRAMUSCULAR | Status: AC
  Filled 2018-05-26: qty 30

## 2018-05-26 MED ORDER — FENTANYL CITRATE (PF) 100 MCG/2ML IJ SOLN
INTRAMUSCULAR | Status: DC | PRN
  Administered 2018-05-26: 100 ug via INTRAVENOUS

## 2018-05-26 MED ORDER — HYDROCODONE-ACETAMINOPHEN 5-325 MG PO TABS: 1.0000 | ORAL_TABLET | Freq: Four times a day (QID) | ORAL | 0 refills | Status: DC | PRN

## 2018-05-26 MED ORDER — FENTANYL CITRATE (PF) 100 MCG/2ML IJ SOLN: 25.0000 ug | INTRAMUSCULAR | Status: DC | PRN

## 2018-05-26 MED ORDER — ONDANSETRON HCL 4 MG/2ML IJ SOLN
INTRAMUSCULAR | Status: DC | PRN
  Administered 2018-05-26: 4 mg via INTRAVENOUS

## 2018-05-26 MED ORDER — GABAPENTIN 300 MG PO CAPS
300.0000 mg | ORAL_CAPSULE | ORAL | Status: AC
  Administered 2018-05-26: 300 mg via ORAL
  Filled 2018-05-26: qty 1

## 2018-05-26 MED ORDER — MIDAZOLAM HCL 2 MG/2ML IJ SOLN
INTRAMUSCULAR | Status: AC
  Filled 2018-05-26: qty 2

## 2018-05-26 MED ORDER — LIDOCAINE 2% (20 MG/ML) 5 ML SYRINGE
INTRAMUSCULAR | Status: AC
  Filled 2018-05-26: qty 10

## 2018-05-26 MED ORDER — SODIUM CHLORIDE 0.9 % IV SOLN: 250.0000 mL | INTRAVENOUS | Status: DC | PRN

## 2018-05-26 MED ORDER — LACTATED RINGERS IV SOLN
INTRAVENOUS | Status: DC | PRN
  Administered 2018-05-26: 12:00:00 via INTRAVENOUS

## 2018-05-26 MED ORDER — ROCURONIUM BROMIDE 50 MG/5ML IV SOSY
PREFILLED_SYRINGE | INTRAVENOUS | Status: AC
  Filled 2018-05-26: qty 5

## 2018-05-26 MED ORDER — CEFAZOLIN SODIUM-DEXTROSE 2-4 GM/100ML-% IV SOLN
2.0000 g | INTRAVENOUS | Status: AC
  Administered 2018-05-26: 2 g via INTRAVENOUS
  Filled 2018-05-26: qty 100

## 2018-05-26 MED ORDER — ROCURONIUM BROMIDE 10 MG/ML (PF) SYRINGE
PREFILLED_SYRINGE | INTRAVENOUS | Status: DC | PRN
  Administered 2018-05-26: 40 mg via INTRAVENOUS

## 2018-05-26 MED ORDER — 0.9 % SODIUM CHLORIDE (POUR BTL) OPTIME
TOPICAL | Status: DC | PRN
  Administered 2018-05-26: 1000 mL

## 2018-05-26 MED ORDER — PROPOFOL 10 MG/ML IV BOLUS
INTRAVENOUS | Status: AC
  Filled 2018-05-26: qty 20

## 2018-05-26 MED ORDER — ACETAMINOPHEN 500 MG PO TABS
1000.0000 mg | ORAL_TABLET | ORAL | Status: AC
  Administered 2018-05-26: 1000 mg via ORAL
  Filled 2018-05-26: qty 2

## 2018-05-26 MED ORDER — FENTANYL CITRATE (PF) 250 MCG/5ML IJ SOLN
INTRAMUSCULAR | Status: AC
  Filled 2018-05-26: qty 5

## 2018-05-26 MED ORDER — SODIUM CHLORIDE 0.9% FLUSH: 3.0000 mL | Freq: Two times a day (BID) | INTRAVENOUS | Status: DC

## 2018-05-26 MED ORDER — ONDANSETRON HCL 4 MG/2ML IJ SOLN
INTRAMUSCULAR | Status: AC
  Filled 2018-05-26: qty 2

## 2018-05-26 MED ORDER — PROPOFOL 10 MG/ML IV BOLUS
INTRAVENOUS | Status: DC | PRN
  Administered 2018-05-26: 140 mg via INTRAVENOUS

## 2018-05-26 MED ORDER — PHENYLEPHRINE 40 MCG/ML (10ML) SYRINGE FOR IV PUSH (FOR BLOOD PRESSURE SUPPORT)
PREFILLED_SYRINGE | INTRAVENOUS | Status: AC
  Filled 2018-05-26: qty 10

## 2018-05-26 SURGICAL SUPPLY — 30 items
ADH SKN CLS APL DERMABOND .7 (GAUZE/BANDAGES/DRESSINGS) ×1
CHLORAPREP W/TINT 26ML (MISCELLANEOUS) ×2 IMPLANT
CONT SPEC 4OZ CLIKSEAL STRL BL (MISCELLANEOUS) ×2 IMPLANT
COVER SURGICAL LIGHT HANDLE (MISCELLANEOUS) ×2 IMPLANT
COVER WAND RF STERILE (DRAPES) ×2 IMPLANT
DECANTER SPIKE VIAL GLASS SM (MISCELLANEOUS) ×4 IMPLANT
DERMABOND ADVANCED (GAUZE/BANDAGES/DRESSINGS) ×1
DERMABOND ADVANCED .7 DNX12 (GAUZE/BANDAGES/DRESSINGS) ×1 IMPLANT
DRAPE LAPAROTOMY 100X72 PEDS (DRAPES) ×2 IMPLANT
ELECT REM PT RETURN 9FT ADLT (ELECTROSURGICAL) ×2
ELECTRODE REM PT RTRN 9FT ADLT (ELECTROSURGICAL) ×1 IMPLANT
GLOVE EUDERMIC 7 POWDERFREE (GLOVE) ×2 IMPLANT
GOWN STRL REUS W/ TWL LRG LVL3 (GOWN DISPOSABLE) ×1 IMPLANT
GOWN STRL REUS W/ TWL XL LVL3 (GOWN DISPOSABLE) ×1 IMPLANT
GOWN STRL REUS W/TWL LRG LVL3 (GOWN DISPOSABLE) ×2
GOWN STRL REUS W/TWL XL LVL3 (GOWN DISPOSABLE) ×2
KIT BASIN OR (CUSTOM PROCEDURE TRAY) ×2 IMPLANT
KIT TURNOVER KIT B (KITS) ×2 IMPLANT
NDL HYPO 25GX1X1/2 BEV (NEEDLE) ×1 IMPLANT
NEEDLE HYPO 25GX1X1/2 BEV (NEEDLE) ×2 IMPLANT
NS IRRIG 1000ML POUR BTL (IV SOLUTION) ×2 IMPLANT
PACK GENERAL/GYN (CUSTOM PROCEDURE TRAY) ×2 IMPLANT
PAD ARMBOARD 7.5X6 YLW CONV (MISCELLANEOUS) ×2 IMPLANT
PENCIL SMOKE EVACUATOR (MISCELLANEOUS) ×2 IMPLANT
SPONGE LAP 4X18 RFD (DISPOSABLE) ×2 IMPLANT
SUT MON AB 4-0 PC3 18 (SUTURE) ×2 IMPLANT
SUT VIC AB 3-0 SH 18 (SUTURE) ×2 IMPLANT
SYR CONTROL 10ML LL (SYRINGE) ×2 IMPLANT
TOWEL OR 17X24 6PK STRL BLUE (TOWEL DISPOSABLE) ×2 IMPLANT
TOWEL OR 17X26 10 PK STRL BLUE (TOWEL DISPOSABLE) ×2 IMPLANT

## 2018-05-26 NOTE — Anesthesia Procedure Notes (Addendum)
Procedure Name: Intubation Date/Time: 05/26/2018 12:35 PM Performed by: Eligha Bridegroom, CRNA Pre-anesthesia Checklist: Patient identified, Suction available, Emergency Drugs available, Patient being monitored and Timeout performed Patient Re-evaluated:Patient Re-evaluated prior to induction Oxygen Delivery Method: Circle system utilized Preoxygenation: Pre-oxygenation with 100% oxygen Induction Type: IV induction Laryngoscope Size: Glidescope (Mac3 x1 by CRNA, Mil 2 x2 by Ermalene Postin, grade 3 view and unable to pass ett) Grade View: Grade II Tube type: Oral Tube size: 7.0 mm Number of attempts: 2 Airway Equipment and Method: Video-laryngoscopy and Stylet Placement Confirmation: breath sounds checked- equal and bilateral,  positive ETCO2 and ETT inserted through vocal cords under direct vision Secured at: 21 cm Tube secured with: Tape Dental Injury: Teeth and Oropharynx as per pre-operative assessment and Injury to lip  Difficulty Due To: Difficulty was unanticipated, Difficult Airway- due to reduced neck mobility and Difficult Airway- due to immobile epiglottis Comments: Tiny cut upper lip left of midline

## 2018-05-26 NOTE — Interval H&P Note (Signed)
History and Physical Interval Note:  05/26/2018 11:52 AM  Amanda [name]  has presented today for surgery, with the diagnosis of LYMPHOMA.  The various methods of treatment have been discussed with the patient and family. After consideration of risks, benefits and other options for treatment, the patient has consented to  Procedure(s): LEFT AXILLARY LYMPH NODE BIOPSY EXCISION DEEP AXILLARY NODE DISSECTION (Left) as a surgical intervention.  The patient's history has been reviewed, patient examined, no change in status, stable for surgery.  I have reviewed the patient's chart and labs.  Questions were answered to the patient's satisfaction.     [name]

## 2018-05-26 NOTE — Op Note (Signed)
Patient Name:           Amanda [name]   Date of Surgery:        05/26/2018  Pre op Diagnosis:      Lymphoproliferative disorder and left axillary adenopathy  Post op Diagnosis:    Same  Procedure:                 Excision deep left axillary lymph nodes  Surgeon:                     Edsel Petrin. Dalbert Batman, M.D., FACS  Assistant:                      Or staff  Operative Indications:   This is a 78 year old woman, referred by Dr. Maylon Peppers at [place] for evaluation of lymphadenopathy, suspected lymphoma.       She was treated for legionnaires disease in 2015. She had a CT scan of her chest in 2017 which showed some infiltrative process. Never a specific diagnosis. She has never seen a pulmonary medicine doctor but does have an appointment on March 31. She's been treated for pneumonia twice this year starting in January. She still has a cough but no sputum production. No fever or chills. A CT scan of the chest was performed on March 17 and surprisingly, this showed axillary adenopathy, mediastinal adenopathy, and mild splenomegaly. There are some inflammatory changes in the right middle lobe and tiny pleural effusions unchanged. This seems like a chronic pulmonary problem and not an acute viral illness. Family history reveals mother was treated for breast cancer and ovarian cancer. Father was treated for prostate cancer      On exam the most prominent obvious adenopathy is left axillary and that will be our target. Her spleen may be palpable.   Operative Findings:       I found a 2.5 cm pathologically enlarged somewhat discolored but mobile and soft lymph node in the left axilla.  I did not remove any other lymph nodes as this should be adequate tissue for diagnosis  Procedure in Detail:          Following the induction of general endotracheal anesthesia the patient's left chest wall and axilla were prepped and draped in a sterile fashion.  Intravenous antibiotics were given.   Surgical timeout was performed.  0.25% Marcaine with epinephrine was used as a local infiltration anesthetic.     A transverse incision was made in the left axilla at the hairline.  Dissection was carried down through the subcutaneous tissue and the clavipectoral fascia was incised.  I found a very large lymph node which was slowly dissected away from the surrounding tissues.  Small bleeders were controlled with electrocautery.  Specimen was sent to the lab fresh with the history attached.  The wound was copiously irrigated.  There was no bleeding or lymphatic drainage.  The clavipectoral fascia was closed with 3-0 Vicryl sutures and the skin closed with a running subcuticular 4-0 Monocryl and Dermabond.  Patient tolerated the procedure well and was taken to PACU in stable condition.  EBL 10 cc.  Counts correct.  Complications none.   Addendum: I logged onto the PMP aware website and reviewed her prescription medication history     Waller Marcussen M. Dalbert Batman, M.D., FACS General and Minimally Invasive Surgery Breast and Colorectal Surgery  05/26/2018 1:27 PM

## 2018-05-26 NOTE — Transfer of Care (Signed)
Immediate Anesthesia Transfer of Care Note  Patient: Amanda [name]  Procedure(s) Performed: LEFT AXILLARY LYMPH NODE BIOPSY (Left Axilla)  Patient Location: PACU  Anesthesia Type:General  Level of Consciousness: awake and alert   Airway & Oxygen Therapy: Patient Spontanous Breathing and Patient connected to face mask oxygen  Post-op Assessment: Report given to RN and Post -op Vital signs reviewed and stable  Post vital signs: Reviewed and stable  Last Vitals:  Vitals Value Taken Time  BP 159/71 05/26/2018  1:45 PM  Temp    Pulse 75 05/26/2018  1:45 PM  Resp 21 05/26/2018  1:45 PM  SpO2 93 % 05/26/2018  1:45 PM  Vitals shown include unvalidated device data.  Last Pain:  Vitals:   05/26/18 1343  TempSrc:   PainSc: Asleep      Patients Stated Pain Goal: 3 (33/83/29 1916)  Complications: No apparent anesthesia complications

## 2018-05-26 NOTE — Telephone Encounter (Signed)
rx faxed

## 2018-05-26 NOTE — Discharge Instructions (Signed)
The left axillary wound is closed in layers. The skin is painted with a clear plastic superglue which will wear off in 3 weeks There are no sutures or staples to be removed  You may take a shower, starting tomorrow night No tub baths or swimming pools for 3 to 4 weeks  You may drive your car in 3 to 4 days No sports or strenuous activities for 3 weeks  Your pain will probably be well controlled with high-dose Tylenol or high-dose ibuprofen I have called in a prescription for a narcotic to your pharmacy in case you need it  Call if there are any problems          Managing Your Pain After Surgery Without Opioids    Thank you for participating in our program to help patients manage their pain after surgery without opioids. This is part of our effort to provide you with the best care possible, without exposing you or your family to the risk that opioids pose.  What pain can I expect after surgery? You can expect to have some pain after surgery. This is normal. The pain is typically worse the day after surgery, and quickly begins to get better. Many studies have found that many patients are able to manage their pain after surgery with Over-the-Counter (OTC) medications such as Tylenol and Motrin. If you have a condition that does not allow you to take Tylenol or Motrin, notify your surgical team.  How will I manage my pain? The best strategy for controlling your pain after surgery is around the clock pain control with Tylenol (acetaminophen) and Motrin (ibuprofen or Advil). Alternating these medications with each other allows you to maximize your pain control. In addition to Tylenol and Motrin, you can use heating pads or ice packs on your incisions to help reduce your pain.  How will I alternate your regular strength over-the-counter pain medication? You will take a dose of pain medication every three hours. ; Start by taking 650 mg of Tylenol (2 pills of 325 mg) ; 3 hours  later take 600 mg of Motrin (3 pills of 200 mg) ; 3 hours after taking the Motrin take 650 mg of Tylenol ; 3 hours after that take 600 mg of Motrin.   - 1 -  See example - if your first dose of Tylenol is at 12:00 PM   12:00 PM Tylenol 650 mg (2 pills of 325 mg)  3:00 PM Motrin 600 mg (3 pills of 200 mg)  6:00 PM Tylenol 650 mg (2 pills of 325 mg)  9:00 PM Motrin 600 mg (3 pills of 200 mg)  Continue alternating every 3 hours   We recommend that you follow this schedule around-the-clock for at least 3 days after surgery, or until you feel that it is no longer needed. Use the table on the last page of this handout to keep track of the medications you are taking. Important: Do not take more than 3000mg  of Tylenol or 3200mg  of Motrin in a 24-hour period. Do not take ibuprofen/Motrin if you have a history of bleeding stomach ulcers, severe kidney disease, &/or actively taking a blood thinner  What if I still have pain? If you have pain that is not controlled with the over-the-counter pain medications (Tylenol and Motrin or Advil) you might have what we call breakthrough pain. You will receive a prescription for a small amount of an opioid pain medication such as Oxycodone, Tramadol, or Tylenol with Codeine. Use these opioid  pills in the first 24 hours after surgery if you have breakthrough pain. Do not take more than 1 pill every 4-6 hours.  If you still have uncontrolled pain after using all opioid pills, don't hesitate to call our staff using the number provided. We will help make sure you are managing your pain in the best way possible, and if necessary, we can provide a prescription for additional pain medication.   Day 1    Time  Name of Medication Number of pills taken  Amount of Acetaminophen  Pain Level   Comments  AM PM       AM PM       AM PM       AM PM       AM PM       AM PM       AM PM       AM PM       Total Daily amount of Acetaminophen Do not take more than   3,000 mg per day      Day 2    Time  Name of Medication Number of pills taken  Amount of Acetaminophen  Pain Level   Comments  AM PM       AM PM       AM PM       AM PM       AM PM       AM PM       AM PM       AM PM       Total Daily amount of Acetaminophen Do not take more than  3,000 mg per day      Day 3    Time  Name of Medication Number of pills taken  Amount of Acetaminophen  Pain Level   Comments  AM PM       AM PM       AM PM       AM PM          AM PM       AM PM       AM PM       AM PM       Total Daily amount of Acetaminophen Do not take more than  3,000 mg per day      Day 4    Time  Name of Medication Number of pills taken  Amount of Acetaminophen  Pain Level   Comments  AM PM       AM PM       AM PM       AM PM       AM PM       AM PM       AM PM       AM PM       Total Daily amount of Acetaminophen Do not take more than  3,000 mg per day      Day 5    Time  Name of Medication Number of pills taken  Amount of Acetaminophen  Pain Level   Comments  AM PM       AM PM       AM PM       AM PM       AM PM       AM PM       AM PM       AM PM  Total Daily amount of Acetaminophen Do not take more than  3,000 mg per day       Day 6    Time  Name of Medication Number of pills taken  Amount of Acetaminophen  Pain Level  Comments  AM PM       AM PM       AM PM       AM PM       AM PM       AM PM       AM PM       AM PM       Total Daily amount of Acetaminophen Do not take more than  3,000 mg per day      Day 7    Time  Name of Medication Number of pills taken  Amount of Acetaminophen  Pain Level   Comments  AM PM       AM PM       AM PM       AM PM       AM PM       AM PM       AM PM       AM PM       Total Daily amount of Acetaminophen Do not take more than  3,000 mg per day        For additional information about how and where to safely dispose of unused opioid medications -  [employer]  Disclaimer: This document contains information and/or instructional materials adapted from [place] for the typical patient with your condition. It does not replace medical advice from your health care provider because your experience may differ from that of the typical patient. Talk to your health care provider if you have any questions about this document, your condition or your treatment plan. Adapted from [place]

## 2018-05-27 ENCOUNTER — Encounter (HOSPITAL_COMMUNITY): Payer: Self-pay | Admitting: General Surgery

## 2018-05-27 ENCOUNTER — Encounter: Payer: Self-pay | Admitting: Hematology

## 2018-05-28 ENCOUNTER — Telehealth: Payer: Self-pay | Admitting: Hematology

## 2018-05-28 ENCOUNTER — Encounter (HOSPITAL_COMMUNITY): Payer: Self-pay | Admitting: General Surgery

## 2018-05-28 NOTE — Telephone Encounter (Signed)
LMVM for patient regarding appointment date change per 3/26 staff message

## 2018-05-29 ENCOUNTER — Other Ambulatory Visit: Payer: Self-pay

## 2018-05-29 ENCOUNTER — Encounter (HOSPITAL_COMMUNITY)
Admission: RE | Admit: 2018-05-29 | Discharge: 2018-05-29 | Disposition: A | Discharge status: Home / Self Care | Payer: Medicare Other | Source: Ambulatory Visit | Attending: Hematology | Admitting: Hematology

## 2018-05-29 DIAGNOSIS — R591 Generalized enlarged lymph nodes: Secondary | ICD-10-CM

## 2018-05-29 DIAGNOSIS — Z79899 Other long term (current) drug therapy: Secondary | ICD-10-CM | POA: Insufficient documentation

## 2018-05-29 DIAGNOSIS — R590 Localized enlarged lymph nodes: Secondary | ICD-10-CM | POA: Diagnosis not present

## 2018-05-29 LAB — GLUCOSE, CAPILLARY: Glucose-Capillary: 114 mg/dL — ABNORMAL HIGH (ref 70–99)

## 2018-05-29 MED ORDER — FLUDEOXYGLUCOSE F - 18 (FDG) INJECTION
7.0000 | Freq: Once | INTRAVENOUS | Status: AC | PRN
  Administered 2018-05-29: 7 via INTRAVENOUS

## 2018-06-02 ENCOUNTER — Inpatient Hospital Stay (HOSPITAL_BASED_OUTPATIENT_CLINIC_OR_DEPARTMENT_OTHER): Payer: Medicare Other | Admitting: Hematology

## 2018-06-02 ENCOUNTER — Other Ambulatory Visit: Payer: Self-pay

## 2018-06-02 ENCOUNTER — Institutional Professional Consult (permissible substitution): Payer: Medicare Other | Admitting: Pulmonary Disease

## 2018-06-02 ENCOUNTER — Encounter: Payer: Self-pay | Admitting: *Deleted

## 2018-06-02 ENCOUNTER — Encounter: Payer: Self-pay | Admitting: Hematology

## 2018-06-02 VITALS — BP 154/72 | HR 81 | Temp 98.6°F | Resp 18 | Ht 61.5 in | Wt 144.2 lb

## 2018-06-02 DIAGNOSIS — R630 Anorexia: Secondary | ICD-10-CM | POA: Diagnosis not present

## 2018-06-02 DIAGNOSIS — J9801 Acute bronchospasm: Secondary | ICD-10-CM | POA: Diagnosis not present

## 2018-06-02 DIAGNOSIS — C8580 Other specified types of non-Hodgkin lymphoma, unspecified site: Secondary | ICD-10-CM | POA: Diagnosis not present

## 2018-06-02 DIAGNOSIS — R918 Other nonspecific abnormal finding of lung field: Secondary | ICD-10-CM | POA: Diagnosis not present

## 2018-06-02 DIAGNOSIS — Z8701 Personal history of pneumonia (recurrent): Secondary | ICD-10-CM

## 2018-06-02 DIAGNOSIS — R591 Generalized enlarged lymph nodes: Secondary | ICD-10-CM | POA: Diagnosis not present

## 2018-06-02 NOTE — Progress Notes (Signed)
[facility] OFFICE PROGRESS NOTE  Patient Care Team: Carollee Herter, Alferd Apa, DO as PCP - General (Family Medicine) Jari Pigg, MD as Consulting Physician (Dermatology) Monna Fam, MD as Consulting Physician (Ophthalmology) Terrance Mass, MD (Inactive) as Consulting Physician (Gynecology) Cordelia Poche, RN as Oncology Nurse Navigator Tish Men, MD as Medical Oncologist (Hematology)  HEME/ONC OVERVIEW: 1. Stage IV NHL, favoring nodal marginal zone lymphoma  -05/2018:   CT chest showed pathologic lymphadenopathy involving bilateral axilla, mediastinum as well as multiple small lung nodules and possible splenomegaly  PET showed FDG-avid lymphadenopathy in neck, chest, abdomen and pelvis, questionable involvement of the lung, and focal involvement of left scapula and acetabulum   Left axillary LN excisional bx: NHL, favoring low-grade marginal zone lymphoma; Cyclin-D1 neg ([facility]) -On surveillance  TREATMENT REGIMEN: -05/2018 - present: active surveillance  ASSESSMENT & PLAN:   Stage IV NHL, favoring nodal marginal zone lymphoma  -I independently reviewed the radiologic images of recent PET, and agree with the findings as documented; in summary, it showed FDG-avid lymphadenopathy in the neck, chest, abdomen and the pelvis, questionable involvement of the lung, as well as focal involvement of the left scapula and acetabulum -I discussed the lymph node biopsy report in detail with Dr. Gari Crown of pathology; overall, he felt that this is consistent with a low-grade marginal zone lymphoma -I reviewed the imaging on the pathology results in detail, as well as the diagnosis, treatment options, and prognosis, with the patient  -While PET showed diffuse lymphadenopathy, there was no bulky disease, threatened endorgan function, or cytopenia secondary to lymphoma; furthermore, patient did not have any associated symptoms, such as unexplained fever, weight loss, night sweats, or  progressive lymphadenopathy -Therefore, in the absence of any indications for treatment, the patient can be monitored closely with labs (q3-52months) and surveillance imaging (q34months up to 2 years and then annually thereafter if no treatment required)  Abnormal findings of lung -CT chest showed some bronchiectasis and non-specific GGO in the lung bases; these areas were noted to be hypermetabolic -While extranodal MZL can have involve lungs, atypical infections (in light of the patient's recent pneumonia) can cause similar presentation -Therefore, I have referred the patient to pulmonary medicine for further evaluation and management   Decreased appetite -Etiology unclear; weight fluctuates somewhat but overall stable  -I have referred the patient to nutrition for dietary recommendations  Recent pneumonia -Patient was treated for presumed pneumonia in 03/2018 and still has some thick mucus; she otherwise denies any recurrent fever, rhinorrhea, cough, dyspnea, or chest pain  -The patient's husband was concerned about the patient's history of recurrent respiratory infections, and requested her to be tested for COVID-19 -I reviewed in detail with the patient about the criteria for COVID-19 testing, and that in the absence of suspicious symptoms, there is no indication for testing asymptomatic individuals -Patient and her husband expressed understanding, and agreed to the plan  Orders Placed This Encounter  Procedures  . CBC with Differential (Cancer Center Only)    Standing Status:   Future    Standing Expiration Date:   07/07/2019  . CMP ([facility] only)    Standing Status:   Future    Standing Expiration Date:   07/07/2019  . Lactate dehydrogenase    Standing Status:   Future    Standing Expiration Date:   07/07/2019  . Ambulatory referral to Pulmonology    Referral Priority:   Urgent    Referral Type:   Consultation  Referral Reason:   Specialty Services Required    Referred to  Provider:   Juanito Doom, MD    Requested Specialty:   Pulmonary Disease    Number of Visits Requested:   1  . Ambulatory referral to Nutrition and Diabetic E    Referral Priority:   Routine    Referral Type:   Consultation    Referral Reason:   Specialty Services Required    Number of Visits Requested:   1   All questions were answered. The patient knows to call the clinic with any problems, questions or concerns. No barriers to learning was detected.  A total of more than 40 minutes were spent face-to-face with the patient during this encounter and over half of that time was spent on counseling and coordination of care as outlined above.   Return in 3 months for labs and clinic follow-up.   Tish Men, MD 06/02/2018 2:45 PM  CHIEF COMPLAINT: "I am just tired"  INTERVAL HISTORY: [name] returns to clinic for follow-up of recent lymph node biopsy results.  Patient reports that she has had chronic, intermittent generalized pruritus, which is overall unchanged.  She was treated for presumed pneumonia in 03/2018, and is still has some thick mucus, usually in the morning.  She has decreased appetite and feels tired all the time, but denies any fever, chill, night sweats, unexplained weight loss, worsening lymphadenopathy, chest pain, dyspnea, hemoptysis, abdominal pain, or diarrhea.  REVIEW OF SYSTEMS:   Constitutional: ( - ) fevers, ( - )  chills , ( - ) night sweats Eyes: ( - ) blurriness of vision, ( - ) double vision, ( - ) watery eyes Ears, nose, mouth, throat, and face: ( - ) mucositis, ( + ) sore throat Respiratory: ( - ) cough, ( - ) dyspnea, ( - ) wheezes Cardiovascular: ( - ) palpitation, ( - ) chest discomfort, ( - ) lower extremity swelling Gastrointestinal:  ( - ) nausea, ( - ) heartburn, ( - ) change in bowel habits Skin: ( - ) abnormal skin rashes Lymphatics: ( - ) new lymphadenopathy, ( - ) easy bruising Neurological: ( - ) numbness, ( - ) tingling, ( - ) new  weaknesses Behavioral/Psych: ( - ) mood change, ( - ) new changes  All other systems were reviewed with the patient and are negative.  I have reviewed the past medical history, past surgical history, social history and family history with the patient and they are unchanged from previous note.  ALLERGIES:  is allergic to phenergan [promethazine hcl]; levofloxacin; and pravastatin sodium.  MEDICATIONS:  Current Outpatient Medications  Medication Sig Dispense Refill  . acetaminophen (TYLENOL) 500 MG tablet Take 1,000 mg by mouth every 6 (six) hours as needed for mild pain.    [name] albuterol (PROVENTIL HFA;VENTOLIN HFA) 108 (90 Base) MCG/ACT inhaler Inhale 2 puffs into the lungs every 6 (six) hours as needed for up to 30 days for wheezing or shortness of breath. 1 Inhaler 2  . albuterol (PROVENTIL) (2.5 MG/3ML) 0.083% nebulizer solution Take 3 mLs (2.5 mg total) by nebulization every 6 (six) hours as needed for wheezing or shortness of breath. 75 mL 12  . buPROPion (WELLBUTRIN XL) 150 MG 24 hr tablet TAKE 1 TABLET BY MOUTH EVERY DAY 90 tablet 0  . doxycycline (VIBRA-TABS) 100 MG tablet Take 100 mg by mouth 2 (two) times daily. Med to be finished 05/28/2018    . HYDROcodone-acetaminophen (NORCO) 5-325 MG tablet Take  1-2 tablets by mouth every 6 (six) hours as needed for moderate pain or severe pain. 20 tablet 0  . levothyroxine (SYNTHROID, LEVOTHROID) 125 MCG tablet TAKE 1 TABLET BY MOUTH DAILY 90 tablet 0  . magic mouthwash w/lidocaine SOLN Take 5 mLs by mouth 4 (four) times daily as needed for mouth pain. Swish and Spit 240 mL 0  . Multiple Vitamins-Minerals (ICAPS) TABS Take 2 tablets by mouth daily.     [name] omeprazole (PRILOSEC) 20 MG capsule Take 1 capsule (20 mg total) by mouth daily. 90 capsule 3  . ondansetron (ZOFRAN) 4 MG tablet Take 1 tablet (4 mg total) by mouth every 8 (eight) hours as needed for nausea or vomiting. 20 tablet 0   No current facility-administered medications for this visit.      PHYSICAL EXAMINATION: ECOG PERFORMANCE STATUS: 1 - Symptomatic but completely ambulatory  Today's Vitals   06/02/18 1344  BP: (!) 154/72  Pulse: 81  Resp: 18  Temp: 98.6 F (37 C)  TempSrc: Oral  SpO2: 96%  Weight: 144 lb 4 oz (65.4 kg)  Height: 5' 1.5" (1.562 m)   Body mass index is 26.81 kg/m.  Filed Weights   06/02/18 1344  Weight: 144 lb 4 oz (65.4 kg)    GENERAL: alert, no distress and comfortable SKIN: skin color, texture, turgor are normal, no rashes or significant lesions EYES: conjunctiva are pink and non-injected, sclera clear OROPHARYNX: no exudate, no erythema; lips, buccal mucosa, and tongue normal  NECK: supple, non-tender LYMPH:  small shotty bilateral cervical adenopathy, LN ~1cm  LUNGS: clear to auscultation with normal breathing effort HEART: regular rate & rhythm and no murmurs and no lower extremity edema ABDOMEN: soft, non-tender, non-distended, normal bowel sounds Musculoskeletal: no cyanosis of digits and no clubbing  PSYCH: alert & oriented x 3, fluent speech NEURO: no focal motor/sensory deficits  LABORATORY DATA:  I have reviewed the data as listed    Component Value Date/Time   NA 140 05/22/2018 0902   K 4.1 05/22/2018 0902   CL 100 05/22/2018 0902   CO2 28 05/22/2018 0902   GLUCOSE 88 05/22/2018 0902   BUN 17 05/22/2018 0902   CREATININE 1.26 (H) 05/22/2018 0902   CREATININE 1.36 (H) 10/27/2015 1530   CALCIUM 10.7 (H) 05/22/2018 0902   PROT 6.6 05/22/2018 0902   ALBUMIN 4.2 05/22/2018 0902   AST 19 05/22/2018 0902   ALT 12 05/22/2018 0902   ALKPHOS 86 05/22/2018 0902   BILITOT 0.4 05/22/2018 0902   GFRNONAA 40 (L) 05/22/2018 0902   GFRAA 47 (L) 05/22/2018 0902    No results found for: SPEP, UPEP  Lab Results  Component Value Date   WBC 8.2 05/22/2018   NEUTROABS 4.9 05/22/2018   HGB 12.0 05/22/2018   HCT 37.3 05/22/2018   MCV 81.8 05/22/2018   PLT 249 05/22/2018      Chemistry      Component Value Date/Time    NA 140 05/22/2018 0902   K 4.1 05/22/2018 0902   CL 100 05/22/2018 0902   CO2 28 05/22/2018 0902   BUN 17 05/22/2018 0902   CREATININE 1.26 (H) 05/22/2018 0902   CREATININE 1.36 (H) 10/27/2015 1530      Component Value Date/Time   CALCIUM 10.7 (H) 05/22/2018 0902   ALKPHOS 86 05/22/2018 0902   AST 19 05/22/2018 0902   ALT 12 05/22/2018 0902   BILITOT 0.4 05/22/2018 0902       RADIOGRAPHIC STUDIES: I have personally reviewed the  radiological images as listed below and agreed with the findings in the report. Dg Chest 2 View  Result Date: 05/18/2018 CLINICAL DATA:  Cough and chest congestion. History of community acquired pneumonia. EXAM: CHEST - 2 VIEW COMPARISON:  Chest x-rays dated 04/24/2018 and 11/20/2015 FINDINGS: The heart size and pulmonary vascularity are normal. There is further increase in the interstitial markings diffusely, most prominent at the lung bases. There are tiny bilateral pleural effusions. There is fullness in the region of the aortopulmonary window which was not present on the prior chest CT in 2017. On the lateral view there is increased density in the right middle lobe medially and also increased density superiorly and anteriorly on the lateral view which could represent a mass or infiltrate. IMPRESSION: 1. Increased interstitial accentuation of possible mass or infiltrate in the region of the aortopulmonary window and chronic inflammatory changes in the right middle lobe. 2. Tiny bilateral effusions, unchanged. Electronically Signed   By: Lorriane Shire M.D.   On: 05/18/2018 17:26   Ct Chest W Contrast  Result Date: 05/19/2018 CLINICAL DATA:  78 year old female with cough and shortness of breath EXAM: CT CHEST WITH CONTRAST TECHNIQUE: Multidetector CT imaging of the chest was performed during intravenous contrast administration. CONTRAST:  58mL OMNIPAQUE IOHEXOL 300 MG/ML  SOLN COMPARISON:  Chest x-ray 05/18/2018, CT 11/20/2015, 11/23/2014 FINDINGS: Cardiovascular:  Heart size within normal limits. Trace pericardial fluid/thickening. Minimal aortic valve calcifications. No significant coronary calcifications. Course caliber and contour of the thoracic aorta unremarkable with no dissection or aneurysm. Branch vessels are patent. Main pulmonary artery diameter within normal limits. No filling defects within the lobar, segmental, or proximal subsegmental vessels. Mediastinum/Nodes: Extensive bulky lymphadenopathy through the mediastinum at all nodal stations, including bilateral hilar regions, new from the comparison studies. Bilateral bulky axillary adenopathy. Unremarkable appearance of the thoracic esophagus. Lungs/Pleura: Bilateral pleural effusions. Multiple bilateral small pulmonary nodules throughout all lobes. Bronchiectasis of the right middle lobe with partial volume loss/atelectasis, worsening from the comparison. Centrilobular ground-glass nodularity of the bilateral lung bases. Mild interlobular septal thickening. Upper Abdomen: The diameter of the spleen measures greater than 11 cm, which measures enlarged compared to the available imaging of the upper abdomen from prior CT studies. Musculoskeletal: No acute displaced fracture. Mild degenerative changes of the thoracic spine. IMPRESSION: Pathologic lymphadenopathy of the bilateral axillary regions and the mediastinum, compatible with lymphoma. Referral for oncologic evaluation recommended. Multiple small lung nodules, with a pattern of centrilobular nodularity at the lung bases, and mild interlobular septal thickening. The differential includes sequela of lymphoproliferative disorder, edema, and/or atypical infection. Small bilateral pleural effusions. Suggestion of splenomegaly, supporting lymphoma or other lymphoproliferative disorder as the primary diagnosis. Complete abdominal imaging may be useful. Electronically Signed   By: Corrie Mckusick D.O.   On: 05/19/2018 14:21   Nm Pet Image Initial (pi) Skull Base To  Thigh  Result Date: 05/29/2018 CLINICAL DATA:  Initial treatment strategy for thoracic lymphadenopathy. EXAM: NUCLEAR MEDICINE PET SKULL BASE TO THIGH TECHNIQUE: 7.0 mCi F-18 FDG was injected intravenously. Full-ring PET imaging was performed from the skull base to thigh after the radiotracer. CT data was obtained and used for attenuation correction and anatomic localization. Fasting blood glucose: 114 mg/dl COMPARISON:  Chest CT 05/19/2018 FINDINGS: Mediastinal blood pool activity: SUV max 2.4 NECK: Hypermetabolic cervical lymphadenopathy identified bilaterally. 9 mm short axis left index level III lymph node demonstrates SUV max = 5.9. Symmetric uptake identified in the oropharynx. Incidental CT findings: none CHEST: Hypermetabolic supraclavicular, subpectoral, axillary,  mediastinal, and bilateral hilar lymphadenopathy evident. *12 mm short axis left subpectoral node (50/4) demonstrates SUV max = 11.2. *14 mm short axis right subpectoral node (55/4) demonstrates SUV max = 15.1. *2.1 cm short axis subcarinal node is hypermetabolic with SUV max = 35.4. Nodular airspace disease noted bilaterally with a basilar predominance. Areas of more confluent interstitial and airspace opacity noted in the right middle lobe with SUV max = 8.4. hypermetabolic disease in the inferior lingula demonstrates SUV max = 5.8. Incidental CT findings: Diffuse interstitial thickening noted in the lower lungs with areas of parenchymal nodularity. Tiny right pleural effusion evident. Bilateral mosaic attenuation is nonspecific but may reflect small airways disease. ABDOMEN/PELVIS: Spleen is diffusely hypermetabolic with SUV max = 5.9. No substantial splenomegaly with 12.5 cm craniocaudal length. Hypermetabolic lymphadenopathy is seen in the gastrohepatic ligament, hepatoduodenal ligament, and retroperitoneal space of the abdomen and along both pelvic sidewalls and both groin regions. *10 mm short axis aortocaval index node (110/4)  demonstrates SUV max = 10. *10 mm short axis left para-aortic node (131/4) demonstrates SUV max = 8.5. *15 mm short axis index node right external iliac chain (166/4) has SUV max = 12.3. *6 mm left groin node (181/4) demonstrates SUV max = 5.1. Incidental CT findings: There is abdominal aortic atherosclerosis without aneurysm. SKELETON: Small hypermetabolic lesion identified inferior tip of left scapula without underlying lytic or sclerotic abnormality on CT imaging. SUV max = 3.3. Small hypermetabolic focus in the roof of the left acetabulum demonstrates SUV max = 4. No underlying lesion evident by CT. Incidental CT findings: none IMPRESSION: 1. Hypermetabolic lymphadenopathy in the neck, chest, abdomen, and pelvis. Lymphoma is considered the most likely etiology although diffuse metastatic involvement can not be completely excluded. 2. Interstitial and parenchymal disease in both lungs with a basilar predominance and areas of more focal consolidative change in the right middle lobe, lingula, and posterior lower lobes. These areas of the lungs are hypermetabolic, compatible with tumor involvement. 3. Small hypermetabolic foci identified in the inferior left scapula and left acetabulum, suspicious for marrow involvement. 4.  Aortic Atherosclerois (ICD10-170.0) Electronically Signed   By: Misty Stanley M.D.   On: 05/29/2018 09:55

## 2018-06-02 NOTE — Progress Notes (Signed)
Spoke to patient to confirm her appointment this afternoon. Since I am working remotely wanted to make sure she knew to reach out if she had any issues after her appointment, and to let her know that I would help coordinate any orders placed after today's appointment.  Patient mentioned having a decreased appetite and frequent n/v. She would like a nutrition consult.   Spoke to Dr Maylon Peppers. He will place nutrition consult order after seeing her in the office this afternoon.

## 2018-06-03 ENCOUNTER — Institutional Professional Consult (permissible substitution): Payer: Medicare Other | Admitting: Pulmonary Disease

## 2018-06-03 ENCOUNTER — Encounter: Payer: Self-pay | Admitting: Hematology

## 2018-06-04 ENCOUNTER — Encounter: Payer: Self-pay | Admitting: *Deleted

## 2018-06-05 ENCOUNTER — Ambulatory Visit (INDEPENDENT_AMBULATORY_CARE_PROVIDER_SITE_OTHER): Payer: Medicare Other | Admitting: Internal Medicine

## 2018-06-05 ENCOUNTER — Encounter: Payer: Self-pay | Admitting: Hematology

## 2018-06-05 ENCOUNTER — Encounter: Payer: Self-pay | Admitting: Internal Medicine

## 2018-06-05 ENCOUNTER — Ambulatory Visit: Payer: Medicare Other | Admitting: Hematology

## 2018-06-05 ENCOUNTER — Other Ambulatory Visit: Payer: Self-pay | Admitting: Critical Care Medicine

## 2018-06-05 ENCOUNTER — Other Ambulatory Visit: Payer: Self-pay

## 2018-06-05 VITALS — BP 122/68 | HR 82 | Ht 61.0 in | Wt 142.0 lb

## 2018-06-05 DIAGNOSIS — R0609 Other forms of dyspnea: Secondary | ICD-10-CM | POA: Insufficient documentation

## 2018-06-05 DIAGNOSIS — J479 Bronchiectasis, uncomplicated: Secondary | ICD-10-CM

## 2018-06-05 DIAGNOSIS — K219 Gastro-esophageal reflux disease without esophagitis: Secondary | ICD-10-CM

## 2018-06-05 MED ORDER — FAMOTIDINE 20 MG PO TABS: ORAL_TABLET | ORAL | 11 refills | Status: DC

## 2018-06-05 MED ORDER — OMEPRAZOLE 20 MG PO CPDR: DELAYED_RELEASE_CAPSULE | ORAL | Status: DC

## 2018-06-05 MED ORDER — AMOXICILLIN-POT CLAVULANATE 875-125 MG PO TABS: 1.0000 | ORAL_TABLET | Freq: Two times a day (BID) | ORAL | 0 refills | Status: AC

## 2018-06-05 NOTE — Progress Notes (Signed)
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 pulm consult    Pt has increase SOB, productive cough-dark yellow, wheezing for last 6 weeks.   Dyspnea:  Across the room gets sob x same time frame as other resp complaints  Cough: at hs worse but sporadic rest of day  Sleep: bed flat one pillow ,  L side down feels better  SABA use: better p neb used w/in 2 h of ov    Mucus is very thick esp first thing in am/ dark green and no resp to doxy Assoc Dysphagia and occ feels choked on her own mucus  No fever    No obvious day to day or daytime variability or assoc  mucus plugs or hemoptysis or cp or chest tightness, subjective wheeze or overt sinus or hb symptoms.     Also denies any obvious fluctuation of symptoms with weather or environmental changes or other aggravating or alleviating factors except as outlined above   No unusual exposure hx or h/o childhood pna/ asthma or knowledge of premature birth.  Current Allergies, Complete Past Medical History, Past Surgical History, Family History, and Social History were reviewed in [employer] record.  ROS  The following are not active complaints unless bolded Hoarseness, sore throat, dysphagia, dental problems, itching, sneezing,  nasal congestion or discharge of excess mucus or purulent secretions, ear  ache,   fever, chills, sweats, unintended wt loss or wt gain, classically pleuritic or exertional cp,  orthopnea pnd or arm/hand swelling  or leg swelling, presyncope, palpitations, abdominal pain, anorexia, nausea, vomiting, diarrhea  or change in bowel habits or change in bladder habits, change in stools or change in urine, dysuria, hematuria,  rash, arthralgias, visual complaints, headache, numbness, weakness or ataxia or problems with walking or coordination,  change in mood or  memory.           Past Medical History:  Diagnosis Date  . Anemia   . Arthritis   . Constipation, chronic   . GERD (gastroesophageal reflux disease)    zantac  . Heart murmur   . History of blood transfusion [place]  . Hyperlipidemia   . Hypothyroidism   . Lymphoproliferative disorder ([place])   . Macular degeneration 2013   Both eyes   . Osteopenia   . Pneumonia   . PONV (postoperative nausea and vomiting)    needs little anesthesia  . Shingles   . Shortness of breath    on exertion  . Spleen enlarged   . SUI (stress urinary incontinence, female)   . Wears glasses     Outpatient Medications Prior to Visit  Medication Sig Dispense Refill  . acetaminophen (TYLENOL) 500 MG tablet Take 1,000 mg by mouth every 6 (  six) hours as needed for mild pain.    [name] albuterol (PROVENTIL HFA;VENTOLIN HFA) 108 (90 Base) MCG/ACT inhaler Inhale 2 puffs into the lungs every 6 (six) hours as needed for up to 30 days for wheezing or shortness of breath. 1 Inhaler 2  . albuterol (PROVENTIL) (2.5 MG/3ML) 0.083% nebulizer solution Take 3 mLs (2.5 mg total) by nebulization every 6 (six) hours as needed for wheezing or shortness of breath. 75 mL 12  . buPROPion (WELLBUTRIN XL) 150 MG 24 hr tablet TAKE 1 TABLET BY MOUTH EVERY DAY 90 tablet 0  .       . HYDROcodone-acetaminophen (NORCO) 5-325 MG tablet Take 1-2 tablets by mouth every 6 (six) hours as needed for moderate pain or severe pain. 20 tablet 0  . levothyroxine  (SYNTHROID, LEVOTHROID) 125 MCG tablet TAKE 1 TABLET BY MOUTH DAILY 90 tablet 0  . magic mouthwash w/lidocaine SOLN Take 5 mLs by mouth 4 (four) times daily as needed for mouth pain. Swish and Spit 240 mL 0  . Multiple Vitamins-Minerals (ICAPS) TABS Take 2 tablets by mouth daily.     [name] omeprazole (PRILOSEC) 20 MG capsule Take 1 capsule (20 mg total) by mouth daily. 90 capsule 3  . ondansetron (ZOFRAN) 4 MG tablet Take 1 tablet (4 mg total) by mouth every 8 (eight) hours as needed for nausea or vomiting. 20 tablet 0      Objective:     BP 122/68 (BP Location: Left Arm, Cuff Size: Normal)   Pulse 82   Ht 5\' 1"  (1.549 m)   Wt 142 lb (64.4 kg)   SpO2 97%   BMI 26.83 kg/m    W/c bound wf nad at rest/ congested sounding cough  RA  Wt Readings from Last 3 Encounters:  06/05/18 142 lb (64.4 kg)  06/02/18 144 lb 4 oz (65.4 kg)  05/26/18 142 lb (64.4 kg)  03/05/2018         152  05/21/2018       158    HEENT: nl dentition, turbinates bilaterally, and oropharynx. Nl external ear canals without cough reflex   NECK :  without JVD/Nodes/TM/ nl carotid upstrokes bilaterally   LUNGS: no acc muscle use,  Nl contour chest with minimal insp/exp rhonchi, some cough provoked on insp and forced exp   CV:  RRR  no s3 or murmur or increase in P2, and no edema   ABD:  soft and nontender with nl inspiratory excursion in the supine position. No bruits or organomegaly appreciated, bowel sounds nl  MS:    ext warm without deformities, calf tenderness, cyanosis or clubbing No obvious joint restrictions   SKIN: warm and dry without lesions    NEURO:  alert, approp, nl sensorium with  no motor or cerebellar deficits apparent.     Labs ordered/ reviewed:      Chemistry      Component Value Date/Time   NA 140 05/22/2018 0902   K 4.1 05/22/2018 0902   CL 100 05/22/2018 0902   CO2 28 05/22/2018 0902   BUN 17 05/22/2018 0902   CREATININE 1.26 (H) 05/22/2018 0902   CREATININE 1.36 (H) 10/27/2015  1530      Component Value Date/Time   CALCIUM 10.7 (H) 05/22/2018 0902   ALKPHOS 86 05/22/2018 0902   AST 19 05/22/2018 0902   ALT 12 05/22/2018 0902   BILITOT 0.4 05/22/2018 0902     Albumin  4.2                05/22/18     BNP                               72                 06/05/2018     Lab Results  Component Value Date   WBC 7.0 06/05/2018   HGB 12.3 06/05/2018   HCT 36.4 06/05/2018   MCV 80.5 06/05/2018   PLT 312 06/05/2018       Absolute Eos    = 182                                                                             06/05/2018     Lab Results  Component Value Date   TSH 0.84 04/24/2018              I personally reviewed images and agree with radiology impression as follows:  Chest CT with contrast  05/19/2018 Pathologic lymphadenopathy of the bilateral axillary regions and the mediastinum, compatible with lymphoma. Referral for oncologic evaluation recommended.  Multiple small lung nodules, with a pattern of centrilobular nodularity at the lung bases, and mild interlobular septal thickening.   Bronchiectasis of the right middle lobe with partial volume loss/atelectasis, worsening from the comparison  Bilateral pleural effusions.    Suggestion of splenomegaly, supporting lymphoma or other lymphoproliferative disorder as the primary diagnosis. Complete abdominal imaging may be useful.        Assessment   Bronchiectasis without complication ([place]) See non HRCT chest 05/19/2018 Bronchiectasis of the right middle lobe with partial volume loss/atelectasis, worsening from the comparison.   Multiple small lung nodules, with a pattern of centrilobular nodularity at the lung bases, and mild interlobular septal thickening. Quant Ig's  06/05/2018    IgA 57, IgG 521, IgM 61 =all low or borderline low range   PET scan is totally non-specific in this setting as clinical hx is most c/w chronic lung infection in pt with previous h/o 3 CAP's so  she certainly has a reason to have acquired bronchiectasis and now has poor humoral immune response (due to lymphoma affecting Ig production)  to likely  smoldering airway infection on the basis of poor mc function / bronchiectasis  though not yet immunosuppressed exogenously (which will be the case when eventually starts chemo) so obviously need to address the infection first as the most treatable of her problems  >>>>Since she is intolerant of FQ best choice is probably Augmentin which if effective clinically rules out resistant gnrs and MAI which are the most problematic and difficult to eradicate in this setting   >>> also add max mucinex/ gerd rx / add flutter next if not improving.   >>> consider early fob once COVID - 19 restrictions have been lifted.       DOE (dyspnea on exertion) Symptoms are markedly disproportionate to objective findings and not clear to what extent this is actually a pulmonary  problem but pt does appear to have difficult to sort out respiratory symptoms of unknown origin for  which  DDX  = almost all start with A and  include Adherence, Ace Inhibitors, Acid Reflux, Active Sinus Disease, Alpha 1 Antitripsin deficiency, Anxiety masquerading as Airways dz,  ABPA,  Allergy(esp in young), Aspiration (esp in elderly), Adverse effects of meds,  Active smoking or Vaping, A bunch of PE's/clot burden (a few small clots can't cause this syndrome unless there is already severe underlying pulm or vascular dz with poor reserve),  Anemia or thyroid disorder, plus two Bs  = Bronchiectasis and Beta blocker use..and one C= CHF     Adherence does not appear to be an issue here   ? Acid (or non-acid) GERD > always difficult to exclude as up to 75% of pts in some series report no assoc GI/ Heartburn symptoms> rec max (24h)  acid suppression and diet restrictions/ reviewed and instructions given in writing.   ? Allergy/ asthma component :  Note eos min elevated > rx Prednisone 10 mg take   4 each am x 2 days,   2 each am x 2 days,  1 each am x 2 days and stop and continue neb prn    ? Adverse drug effects> none of the usual suspects listed    ? Anemia/ thryoid dz > excluded on today's labs  ? Bronchiectasis contributing > ideally needs pfts once  COVID - 19 restrictions have been lifted.   ? chf > excluded by today's labs - doubt the effusions are caused by chf and more likely due to lymphoma interfering with nl lymp drainage of the pleural spaces and likely asymptomatic so not addressed here as separate issue        Total time devoted to counseling  > 50 % of initial 60 min office visit:  review case with pt/husband/ discussion of options/alternatives/ personally creating written customized instructions  in presence of pt  then going over those specific  Instructions directly with the pt including how to use all of the meds but in particular covering each new medication in detail and the difference between the maintenance= "automatic" meds and the prns using an action plan format for the latter (If this problem/symptom => do that organization reading Left to right).  Please see AVS from this visit for a full list of these instructions which I personally wrote for this pt and  are unique to this visit.    Christinia Gully, MD 06/05/2018

## 2018-06-05 NOTE — Progress Notes (Unsigned)
Prescription for prednisone call in per Dr. Gustavus Bryant note

## 2018-06-05 NOTE — Patient Instructions (Addendum)
For cough take mucinex dm 1200 mg every hours as needed - you can add the hydrocodone if can't stop coughing  For difficulty breathing > ok to use nebulizer up to every 4 hours as needed  Prednisone 10 mg take  4 each am x 2 days,   2 each am x 2 days,  1 each am x 2 days and stop   Augmentin 875 mg take one pill twice daily  X 10 days - take at breakfast and supper with large glass of water.  It would help reduce the usual side effects (diarrhea and yeast infections) if you ate cultured yogurt at lunch.   Prilosec (omeprazole) 20 mg x 2 x  Take 30-60 min before first meal of the day and pepcid 20 mg after supper   GERD (REFLUX)  is an extremely common cause of respiratory symptoms just like yours , many times with no obvious heartburn at all.    It can be treated with medication, but also with lifestyle changes including elevation of the head of your bed (ideally with 6 -8inch blocks under the headboard of your bed),  Smoking cessation, avoidance of late meals, excessive alcohol, and avoid fatty foods, chocolate, peppermint, colas, red wine, and acidic juices such as orange juice.  NO MINT OR MENTHOL PRODUCTS SO NO COUGH DROPS  USE SUGARLESS CANDY INSTEAD (Jolley ranchers or Stover's or Life Savers) or even ice chips will also do - the key is to swallow to prevent all throat clearing. NO OIL BASED VITAMINS - use powdered substitutes.  Avoid fish oil when coughing.   Please remember to go to the lab department   for your tests - we will call you with the results when they are available.  I will arrange a televisit in a week to check on your progress

## 2018-06-06 ENCOUNTER — Encounter: Payer: Self-pay | Admitting: Hematology

## 2018-06-06 ENCOUNTER — Encounter: Payer: Self-pay | Admitting: Family Medicine

## 2018-06-06 DIAGNOSIS — J479 Bronchiectasis, uncomplicated: Secondary | ICD-10-CM | POA: Insufficient documentation

## 2018-06-06 NOTE — Assessment & Plan Note (Addendum)
See non HRCT chest 05/19/2018 Bronchiectasis of the right middle lobe with partial volume loss/atelectasis, worsening from the comparison.   Multiple small lung nodules, with a pattern of centrilobular nodularity at the lung bases, and mild interlobular septal thickening. 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   >>> consider early fob once COVID - 19 restrictions have been lifted.

## 2018-06-07 ENCOUNTER — Encounter: Payer: Self-pay | Admitting: Internal Medicine

## 2018-06-07 NOTE — Assessment & Plan Note (Addendum)
Symptoms are markedly disproportionate to objective findings and not clear to what extent this is actually a pulmonary  problem but pt does appear to have difficult to sort out respiratory symptoms of unknown origin for which  DDX  = almost all start with A and  include Adherence, Ace Inhibitors, Acid Reflux, Active Sinus Disease, Alpha 1 Antitripsin deficiency, Anxiety masquerading as Airways dz,  ABPA,  Allergy(esp in young), Aspiration (esp in elderly), Adverse effects of meds,  Active smoking or Vaping, A bunch of PE's/clot burden (a few small clots can't cause this syndrome unless there is already severe underlying pulm or vascular dz with poor reserve),  Anemia or thyroid disorder, plus two Bs  = Bronchiectasis and Beta blocker use..and one C= CHF     Adherence does not appear to be an issue here   ? Acid (or non-acid) GERD > always difficult to exclude as up to 75% of pts in some series report no assoc GI/ Heartburn symptoms> rec max (24h)  acid suppression and diet restrictions/ reviewed and instructions given in writing.   ? Allergy/ asthma component :  Note eos min elevated > rx Prednisone 10 mg take  4 each am x 2 days,   2 each am x 2 days,  1 each am x 2 days and stop and continue neb prn    ? Adverse drug effects> none of the usual suspects listed    ? Anemia/ thryoid dz > excluded on today's labs  ? Bronchiectasis contributing > ideally needs pfts once  COVID - 19 restrictions have been lifted.   ? chf > excluded by today's labs - doubt the effusions are caused by chf and more likely due to lymphoma interfering with nl lymp drainage of the pleural spaces and likely asymptomatic so not addressed here as separate issue    Total time devoted to counseling  > 50 % of initial 60 min office visit:  review case with pt/husband/ discussion of options/alternatives/ personally creating written customized instructions  in presence of pt  then going over those specific  Instructions directly  with the pt including how to use all of the meds but in particular covering each new medication in detail and the difference between the maintenance= "automatic" meds and the prns using an action plan format for the latter (If this problem/symptom => do that organization reading Left to right).  Please see AVS from this visit for a full list of these instructions which I personally wrote for this pt and  are unique to this visit.

## 2018-06-08 ENCOUNTER — Encounter: Payer: Self-pay | Admitting: Family Medicine

## 2018-06-08 ENCOUNTER — Telehealth: Payer: Self-pay | Admitting: Internal Medicine

## 2018-06-08 ENCOUNTER — Telehealth: Payer: Self-pay

## 2018-06-08 DIAGNOSIS — R110 Nausea: Secondary | ICD-10-CM

## 2018-06-08 LAB — CBC WITH DIFFERENTIAL/PLATELET
Absolute Monocytes: 1057 cells/uL — ABNORMAL HIGH (ref 200–950)
Basophils Absolute: 91 cells/uL (ref 0–200)
Basophils Relative: 1.3 %
Eosinophils Absolute: 182 cells/uL (ref 15–500)
Eosinophils Relative: 2.6 %
HCT: 36.4 % (ref 35.0–45.0)
Hemoglobin: 12.3 g/dL (ref 11.7–15.5)
Lymphs Abs: 1393 cells/uL (ref 850–3900)
MCH: 27.2 pg (ref 27.0–33.0)
MCHC: 33.8 g/dL (ref 32.0–36.0)
MCV: 80.5 fL (ref 80.0–100.0)
MPV: 10.2 fL (ref 7.5–12.5)
Monocytes Relative: 15.1 %
Neutro Abs: 4277 cells/uL (ref 1500–7800)
Neutrophils Relative %: 61.1 %
Platelets: 312 10*3/uL (ref 140–400)
RBC: 4.52 10*6/uL (ref 3.80–5.10)
RDW: 14.1 % (ref 11.0–15.0)
Total Lymphocyte: 19.9 %
WBC: 7 10*3/uL (ref 3.8–10.8)

## 2018-06-08 LAB — IGG, IGA, IGM
IgG (Immunoglobin G), Serum: 521 mg/dL — ABNORMAL LOW (ref 600–1540)
IgM, Serum: 61 mg/dL (ref 50–300)
Immunoglobulin A: 57 mg/dL — ABNORMAL LOW (ref 70–320)

## 2018-06-08 LAB — BRAIN NATRIURETIC PEPTIDE: Brain Natriuretic Peptide: 72 pg/mL (ref <100)

## 2018-06-08 LAB — IGE: IgE (Immunoglobulin E), Serum: <2 kU/L (ref <=114)

## 2018-06-08 MED ORDER — ONDANSETRON HCL 4 MG PO TABS: 4.0000 mg | ORAL_TABLET | Freq: Three times a day (TID) | ORAL | 0 refills | Status: DC | PRN

## 2018-06-08 NOTE — Telephone Encounter (Signed)
Returned phone call to patient, she states she got everything taken care of and she no longer needs anything. Nothing further is needed at this time.

## 2018-06-08 NOTE — Telephone Encounter (Signed)
Nutrition  Called patient to let her know that she did not need to come to the cancer center tomorrow for nutrition appointment that it would be conducted via phone.  She is aware.  Niaya Hickok B. Zenia Resides, [place], [place] Registered Dietitian [phone] (pager)

## 2018-06-09 ENCOUNTER — Inpatient Hospital Stay: Payer: Medicare Other | Attending: Hematology

## 2018-06-09 ENCOUNTER — Telehealth: Payer: Self-pay | Admitting: Internal Medicine

## 2018-06-09 NOTE — Progress Notes (Signed)
LMTCB

## 2018-06-09 NOTE — Progress Notes (Signed)
Nutrition Assessment   Reason for Assessment:   Referral for weight loss, poor appetite, GERD   ASSESSMENT:   78 year old female with stage IV non hodgkins lymphoma.  No treatment planned at this time under surveillance. Past medical history of constipation, GERD, HLD, pneumona recently.  RD working remotely.  Called and spoke with patient and husband on speaker phone.  Patient reports poor appetite and lack of taste for several weeks now.  Winded while walking during visit.  Reports that breakfast is usually cereal with fruit or oatmeal with trail mix or eggs sausage/bacon.  Lunch is usually soup, sandwich.  Dinner is [employer] calendars frozen meal (chicken pot pie) with vegetables added.  Husband has been cooking recently.  Has also been making a smoothie with protein powder, milk, fruit or vegetable and sometimes peanut butter added. Reports some dry mouth but sugar free candy has helped.   Nutrition Focused Physical Exam: deferred   Medications: omeprazole, zofran, predinsone   Labs: reviewed   Anthropometrics:   Height: 61 inches Weight: 142 lb UBW: 147 lb BMI: 26  3% weight loss in the last 3 weeks   Estimated Energy Needs  Kcals: 1600-1920 calories Protein: 80-96g Fluid: 1.9 L   NUTRITION DIAGNOSIS: Inadequate oral intake related to poor appetite, recent pneumonia and cancer diagnosis as evidenced by 3% weight loss in the last 3 weeks and poor appetite   INTERVENTION:  Reviewed GERD Nutrition Therapy from [facility] with patient and will email to patient. Discussed strategies to help with taste changes. Encouraged small frequent meals Contact information given and encouraged patient to reach out with further questions   MONITORING, EVALUATION, GOAL: Patient will consume adequate calories and protein to maintain weight   Next Visit: no follow-up planned, patient to contact RD as needed  Fatin Bachicha B. Zenia Resides, [place], [place] Registered Dietitian [phone]  (pager)

## 2018-06-09 NOTE — Telephone Encounter (Signed)
Notes recorded by Tanda Rockers, MD on 06/09/2018 at 5:50 AM EDT Call patient : Studies are unremarkable though her immunoglobulins (antibodies that fight infection) are all borderline low likely from the lymphoma, I sent copy to Dr Maylon Peppers but no immediate change in rx needed  Patient aware nothing further is needed.

## 2018-06-09 NOTE — Anesthesia Postprocedure Evaluation (Signed)
Anesthesia Post Note  Patient: Amanda [name]  Procedure(s) Performed: LEFT AXILLARY LYMPH NODE BIOPSY (Left Axilla)     Patient location during evaluation: PACU Anesthesia Type: General Level of consciousness: awake and alert Pain management: pain level controlled Vital Signs Assessment: post-procedure vital signs reviewed and stable Respiratory status: spontaneous breathing, nonlabored ventilation, respiratory function stable and patient connected to nasal cannula oxygen Cardiovascular status: blood pressure returned to baseline and stable Postop Assessment: no apparent nausea or vomiting Anesthetic complications: no    Last Vitals:  Vitals:   05/26/18 1407 05/26/18 1418  BP:    Pulse:  78  Resp:  18  Temp:    SpO2: 96% 94%    Last Pain:  Vitals:   05/26/18 1404  TempSrc:   PainSc: 0-No pain                 Valli Randol

## 2018-06-11 ENCOUNTER — Encounter: Payer: Self-pay | Admitting: Hematology

## 2018-06-15 ENCOUNTER — Telehealth: Payer: Self-pay

## 2018-06-15 NOTE — Telephone Encounter (Signed)
Copied from [place] [id number]. Topic: Appointment Scheduling - Scheduling Inquiry for Clinic >> Jun 15, 2018  2:31 PM Reyne Dumas L wrote: Reason for CRM:   Pt's spouse called and left message on [place].  States that pt is weak, loosing weight, loss of appetite.  Pt needs OV.

## 2018-06-16 ENCOUNTER — Encounter (HOSPITAL_BASED_OUTPATIENT_CLINIC_OR_DEPARTMENT_OTHER): Payer: Self-pay

## 2018-06-16 ENCOUNTER — Encounter: Payer: Self-pay | Admitting: Family Medicine

## 2018-06-16 ENCOUNTER — Emergency Department (HOSPITAL_BASED_OUTPATIENT_CLINIC_OR_DEPARTMENT_OTHER): Payer: Medicare Other

## 2018-06-16 ENCOUNTER — Other Ambulatory Visit: Payer: Self-pay

## 2018-06-16 ENCOUNTER — Other Ambulatory Visit: Payer: Self-pay | Admitting: *Deleted

## 2018-06-16 ENCOUNTER — Ambulatory Visit (INDEPENDENT_AMBULATORY_CARE_PROVIDER_SITE_OTHER): Payer: Medicare Other | Admitting: Family Medicine

## 2018-06-16 ENCOUNTER — Inpatient Hospital Stay (HOSPITAL_BASED_OUTPATIENT_CLINIC_OR_DEPARTMENT_OTHER)
Admission: EM | Admit: 2018-06-16 | Discharge: 2018-06-20 | DRG: 683 | Disposition: A | Discharge status: Home Health | Payer: Medicare Other | Attending: Family Medicine | Admitting: Family Medicine

## 2018-06-16 DIAGNOSIS — Z8042 Family history of malignant neoplasm of prostate: Secondary | ICD-10-CM

## 2018-06-16 DIAGNOSIS — N179 Acute kidney failure, unspecified: Secondary | ICD-10-CM | POA: Diagnosis not present

## 2018-06-16 DIAGNOSIS — M8580 Other specified disorders of bone density and structure, unspecified site: Secondary | ICD-10-CM | POA: Diagnosis present

## 2018-06-16 DIAGNOSIS — Z833 Family history of diabetes mellitus: Secondary | ICD-10-CM

## 2018-06-16 DIAGNOSIS — Z881 Allergy status to other antibiotic agents status: Secondary | ICD-10-CM

## 2018-06-16 DIAGNOSIS — Z8249 Family history of ischemic heart disease and other diseases of the circulatory system: Secondary | ICD-10-CM | POA: Diagnosis not present

## 2018-06-16 DIAGNOSIS — E785 Hyperlipidemia, unspecified: Secondary | ICD-10-CM | POA: Diagnosis present

## 2018-06-16 DIAGNOSIS — I160 Hypertensive urgency: Secondary | ICD-10-CM | POA: Diagnosis present

## 2018-06-16 DIAGNOSIS — E876 Hypokalemia: Secondary | ICD-10-CM | POA: Diagnosis present

## 2018-06-16 DIAGNOSIS — C8300 Small cell B-cell lymphoma, unspecified site: Secondary | ICD-10-CM | POA: Diagnosis not present

## 2018-06-16 DIAGNOSIS — K5900 Constipation, unspecified: Secondary | ICD-10-CM | POA: Diagnosis present

## 2018-06-16 DIAGNOSIS — E860 Dehydration: Secondary | ICD-10-CM | POA: Diagnosis not present

## 2018-06-16 DIAGNOSIS — Z8041 Family history of malignant neoplasm of ovary: Secondary | ICD-10-CM

## 2018-06-16 DIAGNOSIS — D631 Anemia in chronic kidney disease: Secondary | ICD-10-CM | POA: Diagnosis present

## 2018-06-16 DIAGNOSIS — E039 Hypothyroidism, unspecified: Secondary | ICD-10-CM | POA: Diagnosis present

## 2018-06-16 DIAGNOSIS — Z20828 Contact with and (suspected) exposure to other viral communicable diseases: Secondary | ICD-10-CM | POA: Diagnosis present

## 2018-06-16 DIAGNOSIS — Z79890 Hormone replacement therapy (postmenopausal): Secondary | ICD-10-CM

## 2018-06-16 DIAGNOSIS — R531 Weakness: Secondary | ICD-10-CM | POA: Diagnosis not present

## 2018-06-16 DIAGNOSIS — D649 Anemia, unspecified: Secondary | ICD-10-CM | POA: Diagnosis not present

## 2018-06-16 DIAGNOSIS — R05 Cough: Secondary | ICD-10-CM | POA: Diagnosis not present

## 2018-06-16 DIAGNOSIS — R161 Splenomegaly, not elsewhere classified: Secondary | ICD-10-CM | POA: Diagnosis present

## 2018-06-16 DIAGNOSIS — Z888 Allergy status to other drugs, medicaments and biological substances status: Secondary | ICD-10-CM

## 2018-06-16 DIAGNOSIS — Z8349 Family history of other endocrine, nutritional and metabolic diseases: Secondary | ICD-10-CM

## 2018-06-16 DIAGNOSIS — I129 Hypertensive chronic kidney disease with stage 1 through stage 4 chronic kidney disease, or unspecified chronic kidney disease: Secondary | ICD-10-CM | POA: Diagnosis present

## 2018-06-16 DIAGNOSIS — Z825 Family history of asthma and other chronic lower respiratory diseases: Secondary | ICD-10-CM

## 2018-06-16 DIAGNOSIS — Z841 Family history of disorders of kidney and ureter: Secondary | ICD-10-CM

## 2018-06-16 DIAGNOSIS — K219 Gastro-esophageal reflux disease without esophagitis: Secondary | ICD-10-CM | POA: Diagnosis present

## 2018-06-16 DIAGNOSIS — M1990 Unspecified osteoarthritis, unspecified site: Secondary | ICD-10-CM | POA: Diagnosis present

## 2018-06-16 DIAGNOSIS — Z803 Family history of malignant neoplasm of breast: Secondary | ICD-10-CM

## 2018-06-16 DIAGNOSIS — N183 Chronic kidney disease, stage 3 (moderate): Secondary | ICD-10-CM | POA: Diagnosis not present

## 2018-06-16 DIAGNOSIS — Z66 Do not resuscitate: Secondary | ICD-10-CM | POA: Diagnosis present

## 2018-06-16 DIAGNOSIS — C8580 Other specified types of non-Hodgkin lymphoma, unspecified site: Secondary | ICD-10-CM | POA: Diagnosis not present

## 2018-06-16 DIAGNOSIS — H3530 Unspecified macular degeneration: Secondary | ICD-10-CM | POA: Diagnosis present

## 2018-06-16 DIAGNOSIS — Z823 Family history of stroke: Secondary | ICD-10-CM

## 2018-06-16 LAB — URINALYSIS, MICROSCOPIC (REFLEX)

## 2018-06-16 LAB — COMPREHENSIVE METABOLIC PANEL
ALT: 14 U/L (ref 0–44)
AST: 24 U/L (ref 15–41)
Albumin: 4 g/dL (ref 3.5–5.0)
Alkaline Phosphatase: 61 U/L (ref 38–126)
Anion gap: 10 (ref 5–15)
BUN: 27 mg/dL — ABNORMAL HIGH (ref 8–23)
CO2: 27 mmol/L (ref 22–32)
Calcium: 13 mg/dL — ABNORMAL HIGH (ref 8.9–10.3)
Chloride: 100 mmol/L (ref 98–111)
Creatinine, Ser: 1.85 mg/dL — ABNORMAL HIGH (ref 0.44–1.00)
GFR calc Af Amer: 29 mL/min — ABNORMAL LOW (ref >60)
GFR calc non Af Amer: 25 mL/min — ABNORMAL LOW (ref >60)
Glucose, Bld: 119 mg/dL — ABNORMAL HIGH (ref 70–99)
Potassium: 3.4 mmol/L — ABNORMAL LOW (ref 3.5–5.1)
Sodium: 137 mmol/L (ref 135–145)
Total Bilirubin: 0.4 mg/dL (ref 0.3–1.2)
Total Protein: 6.9 g/dL (ref 6.5–8.1)

## 2018-06-16 LAB — CBC WITH DIFFERENTIAL/PLATELET
Abs Immature Granulocytes: 0.17 10*3/uL — ABNORMAL HIGH (ref 0.00–0.07)
Basophils Absolute: 0.1 10*3/uL (ref 0.0–0.1)
Basophils Relative: 1 %
Eosinophils Absolute: 0.4 10*3/uL (ref 0.0–0.5)
Eosinophils Relative: 5 %
HCT: 39.6 % (ref 36.0–46.0)
Hemoglobin: 12.6 g/dL (ref 12.0–15.0)
Immature Granulocytes: 2 %
Lymphocytes Relative: 15 %
Lymphs Abs: 1.2 10*3/uL (ref 0.7–4.0)
MCH: 26 pg (ref 26.0–34.0)
MCHC: 31.8 g/dL (ref 30.0–36.0)
MCV: 81.6 fL (ref 80.0–100.0)
Monocytes Absolute: 0.9 10*3/uL (ref 0.1–1.0)
Monocytes Relative: 11 %
Neutro Abs: 5.4 10*3/uL (ref 1.7–7.7)
Neutrophils Relative %: 66 %
Platelets: 245 10*3/uL (ref 150–400)
RBC: 4.85 MIL/uL (ref 3.87–5.11)
RDW: 14.5 % (ref 11.5–15.5)
WBC: 8.2 10*3/uL (ref 4.0–10.5)
nRBC: 0 % (ref 0.0–0.2)

## 2018-06-16 LAB — URINALYSIS, ROUTINE W REFLEX MICROSCOPIC
Bilirubin Urine: NEGATIVE
Glucose, UA: NEGATIVE mg/dL
Hgb urine dipstick: NEGATIVE
Ketones, ur: NEGATIVE mg/dL
Nitrite: NEGATIVE
Protein, ur: NEGATIVE mg/dL
Specific Gravity, Urine: 1.01 (ref 1.005–1.030)
pH: 7 (ref 5.0–8.0)

## 2018-06-16 LAB — SARS CORONAVIRUS 2 BY RT PCR (HOSPITAL ORDER, PERFORMED IN ~~LOC~~ HOSPITAL LAB): SARS Coronavirus 2: NEGATIVE

## 2018-06-16 LAB — TSH: TSH: 0.582 u[IU]/mL (ref 0.350–4.500)

## 2018-06-16 LAB — CBG MONITORING, ED: Glucose-Capillary: 107 mg/dL — ABNORMAL HIGH (ref 70–99)

## 2018-06-16 LAB — TROPONIN I: Troponin I: <0.03 ng/mL (ref <0.03)

## 2018-06-16 LAB — ABO/RH: ABO/RH(D): O POS

## 2018-06-16 MED ORDER — ONDANSETRON HCL 4 MG/2ML IJ SOLN
INTRAMUSCULAR | Status: AC
  Administered 2018-06-16: 4 mg via INTRAVENOUS
  Filled 2018-06-16: qty 2

## 2018-06-16 MED ORDER — LEVOTHYROXINE SODIUM 25 MCG PO TABS
125.0000 ug | ORAL_TABLET | Freq: Every day | ORAL | Status: DC
  Administered 2018-06-17 – 2018-06-20 (×4): 125 ug via ORAL
  Filled 2018-06-16 (×4): qty 1

## 2018-06-16 MED ORDER — ENOXAPARIN SODIUM 30 MG/0.3ML ~~LOC~~ SOLN
30.0000 mg | Freq: Every day | SUBCUTANEOUS | Status: DC
  Administered 2018-06-16 – 2018-06-18 (×3): 30 mg via SUBCUTANEOUS
  Filled 2018-06-16 (×3): qty 0.3

## 2018-06-16 MED ORDER — POTASSIUM CHLORIDE 20 MEQ/15ML (10%) PO SOLN
20.0000 meq | Freq: Once | ORAL | Status: AC
  Administered 2018-06-16: 15:00:00 20 meq via ORAL
  Filled 2018-06-16: qty 15

## 2018-06-16 MED ORDER — SODIUM CHLORIDE 0.9 % IV SOLN
Freq: Once | INTRAVENOUS | Status: AC
  Administered 2018-06-16: 14:00:00 via INTRAVENOUS

## 2018-06-16 MED ORDER — BUPROPION HCL ER (XL) 150 MG PO TB24
150.0000 mg | ORAL_TABLET | Freq: Every day | ORAL | Status: DC
  Administered 2018-06-16 – 2018-06-20 (×5): 150 mg via ORAL
  Filled 2018-06-16 (×5): qty 1

## 2018-06-16 MED ORDER — ONDANSETRON HCL 4 MG/2ML IJ SOLN
4.0000 mg | Freq: Once | INTRAMUSCULAR | Status: AC
  Administered 2018-06-16: 15:00:00 4 mg via INTRAVENOUS

## 2018-06-16 MED ORDER — HYDRALAZINE HCL 20 MG/ML IJ SOLN
10.0000 mg | Freq: Once | INTRAMUSCULAR | Status: AC
  Administered 2018-06-16: 20:00:00 10 mg via INTRAVENOUS
  Filled 2018-06-16: qty 1

## 2018-06-16 MED ORDER — HYDRALAZINE HCL 20 MG/ML IJ SOLN
5.0000 mg | INTRAMUSCULAR | Status: DC | PRN
  Administered 2018-06-16: 19:00:00 5 mg via INTRAVENOUS
  Filled 2018-06-16: qty 1

## 2018-06-16 MED ORDER — HYDRALAZINE HCL 20 MG/ML IJ SOLN: 10.0000 mg | INTRAMUSCULAR | Status: DC | PRN

## 2018-06-16 MED ORDER — ONDANSETRON HCL 4 MG/2ML IJ SOLN
4.0000 mg | Freq: Four times a day (QID) | INTRAMUSCULAR | Status: DC | PRN
  Filled 2018-06-16: qty 2

## 2018-06-16 MED ORDER — SODIUM CHLORIDE 0.9 % IV SOLN
1.0000 g | Freq: Every day | INTRAVENOUS | Status: DC
  Administered 2018-06-16 – 2018-06-18 (×3): 1 g via INTRAVENOUS
  Filled 2018-06-16: qty 1
  Filled 2018-06-16: qty 10
  Filled 2018-06-16 (×2): qty 1

## 2018-06-16 MED ORDER — ACETAMINOPHEN 500 MG PO TABS
1000.0000 mg | ORAL_TABLET | Freq: Four times a day (QID) | ORAL | Status: DC | PRN
  Administered 2018-06-17: 1000 mg via ORAL
  Filled 2018-06-16: qty 2

## 2018-06-16 MED ORDER — LACTATED RINGERS IV BOLUS
1000.0000 mL | Freq: Once | INTRAVENOUS | Status: AC
  Administered 2018-06-16: 13:00:00 1000 mL via INTRAVENOUS

## 2018-06-16 MED ORDER — SODIUM CHLORIDE 0.9 % IV SOLN
INTRAVENOUS | Status: DC
  Administered 2018-06-16 – 2018-06-19 (×5): via INTRAVENOUS

## 2018-06-16 MED ORDER — PANTOPRAZOLE SODIUM 40 MG PO TBEC
40.0000 mg | DELAYED_RELEASE_TABLET | Freq: Every day | ORAL | Status: DC
  Administered 2018-06-16 – 2018-06-20 (×5): 40 mg via ORAL
  Filled 2018-06-16 (×5): qty 1

## 2018-06-16 MED ORDER — POTASSIUM CHLORIDE CRYS ER 20 MEQ PO TBCR
20.0000 meq | EXTENDED_RELEASE_TABLET | Freq: Once | ORAL | Status: DC
  Filled 2018-06-16: qty 1

## 2018-06-16 NOTE — Progress Notes (Signed)
Report called to [facility] on [facility]. Husband notified of transfer.

## 2018-06-16 NOTE — ED Notes (Signed)
Patient transported to X-ray 

## 2018-06-16 NOTE — Assessment & Plan Note (Signed)
Orthostatic Advised husband to take her to the ER to be evaluated

## 2018-06-16 NOTE — ED Notes (Signed)
Report given to [facility]. 

## 2018-06-16 NOTE — H&P (Signed)
History and Physical    Amanda [name] XKP:[id number] DOB: 02-10-1941 DOA: 06/16/2018  PCP: [name], DO  Patient coming from: Home / [facility]  I have personally briefly reviewed patient's old medical records in Amanda [name]  Chief Complaint: decreased appetite, fatigue since 2 to 3 weeks.   HPI: Amanda [name] is a 78 y.o. female with medical history significant of with lymphoma, hypertension. Hypothyroidism, splenomegaly, GERD, stage 4 NHL, low grade nodal marginal zone lymphoma no indication of treatment as per Dr Maylon Peppers,  recent recurrent pneumonia, completed two rounds of antibiotics , followed up with Dr Melvyn Novas for bronchiectasis,  lymphadenopathy, presents to [facility] for worsening fatigue, appetite loss, nausea,vomiting with dry cough, . As per the husband pt had low grade temp yesterday up to 100.2,  Chills, respiratory symptoms of cough, sob over the last 2 to 3 weeks. She denies any chest pain. No diarrhea, no dysuria, no headache, dizziness . She reports generalized weakness.  She was accepted to [facility] for further evaluation.   ED Course: on arrival she was afebrile, bp of 143/1mmh, HR 80/min. resp rate of 16/min.   CXR showed  Continued adenopathy in the mediastinum.  Continued interstitial and parenchymal opacities in the bases, mildly improved since May 18, 2018. These opacities were favored to represent lymphomatous involvement based on a PET-CT from May 29, 2018.   Review of Systems: As per HPI otherwise 10 point review of systems negative.    Past Medical History:  Diagnosis Date  . Anemia   . Arthritis   . Constipation, chronic   . GERD (gastroesophageal reflux disease)    zantac  . Heart murmur   . History of blood transfusion Amanda [name]  . Hyperlipidemia   . Hypothyroidism   . Lymphoproliferative disorder (Amanda [name])   . Macular degeneration 2013   Both eyes   . Osteopenia   . Pneumonia   . PONV (postoperative nausea and vomiting)    needs  little anesthesia  . Shingles   . Shortness of breath    on exertion  . Spleen enlarged   . SUI (stress urinary incontinence, female)   . Wears glasses     Past Surgical History:  Procedure Laterality Date  . BREAST EXCISIONAL BIOPSY Left 1980  . CARPAL TUNNEL RELEASE  1999  . CATARACT EXTRACTION  2009, 2011   BOTH EYES  . [place]  . COLONOSCOPY      Dr Cristina Gong  . DILATION AND CURETTAGE OF UTERUS     X2  . HYSTEROSCOPY W/D&C  01/07/2012   Procedure: DILATATION AND CURETTAGE /HYSTEROSCOPY;  Surgeon: Terrance Mass, MD;  Location: [name];  Service: Gynecology;  Laterality: N/A;  intrauterine foley catheter for tamponode   . LYMPH NODE BIOPSY Left 05/26/2018   Procedure: LEFT AXILLARY LYMPH NODE BIOPSY;  Surgeon: Fanny Skates, MD;  Location: [place];  Service: General;  Laterality: Left;  . TONSILLECTOMY    . TONSILLECTOMY AND ADENOIDECTOMY    . TUBAL LIGATION     BY LAPAROSCOPY  . WISDOM TOOTH EXTRACTION       reports that she has never smoked. She has never used smokeless tobacco. She reports current alcohol use. She reports that she does not use drugs.  Allergies  Allergen Reactions  . Phenergan [Promethazine Hcl] Other (See Comments)    Jerking/agitation  . Levofloxacin Nausea And Vomiting  . Pravastatin Sodium Nausea And Vomiting    Family History  Problem Relation Age of Onset  . Ovarian cancer Mother   . Breast cancer Mother 30  . Hypertension Father   . Prostate cancer Father   . Kidney failure Father   . Diabetes Father   . Hyperlipidemia Brother   . COPD Paternal Grandfather   . Stroke Maternal Grandfather    Family history reviewed and not pertinent  Prior to Admission medications   Medication Sig Start Date End Date Taking? Authorizing Provider  albuterol (PROVENTIL) (2.5 MG/3ML) 0.083% nebulizer solution Take 3 mLs (2.5 mg total) by nebulization every 6 (six) hours as needed for wheezing or shortness of breath. 04/24/18  Yes Carollee Herter, Yvonne R, DO  buPROPion (WELLBUTRIN XL) 150 MG 24 hr tablet TAKE 1 TABLET BY MOUTH EVERY DAY 02/10/18  Yes Roma Schanz R, DO  famotidine (PEPCID) 20 MG tablet One at bedtime Patient taking differently: Take 20 mg by mouth at bedtime. One at bedtime  06/05/18  Yes Tanda Rockers, MD  levothyroxine (SYNTHROID, LEVOTHROID) 125 MCG tablet TAKE 1 TABLET BY MOUTH DAILY 05/15/18  Yes Roma Schanz R, DO  Multiple Vitamins-Minerals (ICAPS) TABS Take 1 tablet by mouth daily.    Yes [provider]  acetaminophen (TYLENOL) 500 MG tablet Take 1,000 mg by mouth every 6 (six) hours as needed for mild pain.    [provider]  albuterol (PROVENTIL HFA;VENTOLIN HFA) 108 (90 Base) MCG/ACT inhaler Inhale 2 puffs into the lungs every 6 (six) hours as needed for up to 30 days for wheezing or shortness of breath. 05/22/18 06/21/18  Tish Men, MD  omeprazole (PRILOSEC) 20 MG capsule Take 2 x 30 min before breakfast Patient taking differently: Take 20 mg by mouth daily.  06/05/18   Tanda Rockers, MD  ondansetron (ZOFRAN) 4 MG tablet Take 1 tablet (4 mg total) by mouth every 8 (eight) hours as needed for nausea or vomiting. 06/08/18   [name], DO  predniSONE (DELTASONE) 10 MG tablet  06/05/18   [provider]    Physical Exam: Vitals:   06/16/18 1227 06/16/18 1330 06/16/18 1559  BP: (!) 143/89 (!) 174/70 (!) 191/87  Pulse: 80 62 68  Resp: 16 20 20   Temp: 98.3 F (36.8 C)  98.3 F (36.8 C)  TempSrc: Oral    SpO2: 97% 95% 98%  Weight: 61.7 kg    Height: 5' 1.5" (1.562 m)      Constitutional: NAD, calm, comfortable Vitals:   06/16/18 1227 06/16/18 1330 06/16/18 1559  BP: (!) 143/89 (!) 174/70 (!) 191/87  Pulse: 80 62 68  Resp: 16 20 20   Temp: 98.3 F (36.8 C)  98.3 F (36.8 C)  TempSrc: Oral    SpO2: 97% 95% 98%  Weight: 61.7 kg    Height: 5' 1.5" (1.562 m)     Eyes: PERRL, lids and conjunctivae normal ENMT: Mucous membranes are moist.  Posterior pharynx clear of any exudate or lesions.Normal dentition.  Neck: normal, supple, no masses, no thyromegaly Respiratory: clear to auscultation bilaterally, no wheezing, no crackles. Normal respiratory effort. No accessory muscle use.  Cardiovascular: Regular rate and rhythm, no murmurs / rubs / gallops. No extremity edema. 2+ pedal pulses. No carotid bruits.  Abdomen:  Soft non tender non distended.bowel sounds good.  Musculoskeletal: no clubbing / cyanosis. No joint deformity upper and lower extremities. Good ROM, no contractures. Normal muscle tone.  Skin: no rashes, lesions, ulcers. No induration Neurologic: CN 2-12 grossly intact. Sensation intact, DTR  normal. Strength 5/5 in all 4.  Psychiatric: Normal judgment and insight. Alert and oriented x 3. Normal mood.    Labs on Admission: I have personally reviewed following labs and imaging studies  CBC: Recent Labs  Lab 06/16/18 1313  WBC 8.2  NEUTROABS 5.4  HGB 12.6  HCT 39.6  MCV 81.6  PLT 622   Basic Metabolic Panel: Recent Labs  Lab 06/16/18 1313  NA 137  K 3.4*  CL 100  CO2 27  GLUCOSE 119*  BUN 27*  CREATININE 1.85*  CALCIUM 13.0*   GFR: Estimated Creatinine Clearance: 21.1 mL/min (A) (by C-G formula based on SCr of 1.85 mg/dL (H)). Liver Function Tests: Recent Labs  Lab 06/16/18 1313  AST 24  ALT 14  ALKPHOS 61  BILITOT 0.4  PROT 6.9  ALBUMIN 4.0   No results for input(s): LIPASE, AMYLASE in the last 168 hours. No results for input(s): AMMONIA in the last 168 hours. Coagulation Profile: No results for input(s): INR, PROTIME in the last 168 hours. Cardiac Enzymes: Recent Labs  Lab 06/16/18 1313  TROPONINI <0.03   BNP (last 3 results) No results for input(s): PROBNP in the last 8760 hours. HbA1C: No results for input(s): HGBA1C in the last 72 hours. CBG: Recent Labs  Lab 06/16/18 1248  GLUCAP 107*   Lipid Profile: No results for input(s): CHOL, HDL, LDLCALC, TRIG, CHOLHDL, LDLDIRECT  in the last 72 hours. Thyroid Function Tests: No results for input(s): TSH, T4TOTAL, FREET4, T3FREE, THYROIDAB in the last 72 hours. Anemia Panel: No results for input(s): VITAMINB12, FOLATE, FERRITIN, TIBC, IRON, RETICCTPCT in the last 72 hours. Urine analysis:    Component Value Date/Time   COLORURINE YELLOW 06/16/2018 1404   APPEARANCEUR CLEAR 06/16/2018 1404   LABSPEC 1.010 06/16/2018 1404   PHURINE 7.0 06/16/2018 1404   GLUCOSEU NEGATIVE 06/16/2018 1404   HGBUR NEGATIVE 06/16/2018 1404   BILIRUBINUR NEGATIVE 06/16/2018 1404   BILIRUBINUR neg 06/05/2015 1654   KETONESUR NEGATIVE 06/16/2018 1404   PROTEINUR NEGATIVE 06/16/2018 1404   UROBILINOGEN 0.2 06/05/2015 1654   UROBILINOGEN 1.0 11/15/2014 1625   NITRITE NEGATIVE 06/16/2018 1404   LEUKOCYTESUR SMALL (A) 06/16/2018 1404    Radiological Exams on Admission: Dg Chest 2 View  Result Date: 06/16/2018 CLINICAL DATA:  Three weeks of coughing. Recent diagnosis of lymphoma. EXAM: CHEST - 2 VIEW COMPARISON:  May 18, 2018 FINDINGS: Continued prominence of the mediastinum consistent with known adenopathy/lymphoma. The cardiomediastinal silhouette is stable. No pneumothorax. Interstitial and parenchymal opacity in the bases persists but is mildly improved since May 18, 2018. No other changes. IMPRESSION: 1. Continued adenopathy in the mediastinum. 2. Continued interstitial and parenchymal opacities in the bases, mildly improved since May 18, 2018. These opacities were favored to represent lymphomatous involvement based on a PET-CT from May 29, 2018. Electronically Signed   By: Dorise Bullion III M.D   On: 06/16/2018 13:09    EKG: Independently reviewed. Sinus rhythm.   Assessment/Plan Active Problems:   AKI (acute kidney injury) (HCC)   Low grade temp,  With chills, non productive cough, generalized weakness, fatigue and loss of appetite and loss of weight over the last 2 to 3 weeks.   - CXR does not show any pneumonia,  only the lymphomatous changes.  - blood cultures ordered. Urine cultures ordered.  - empirically started her on Rocephin.  - normal wbc count.  - Rule out COVID-19 , very low risk of transmission.     Stage 4 NHL :  Follows up with Dr Maylon Peppers, no indication of chemo as per Dr zhao's recommendations.    AKI;  Suspect from dehydration and decreased oral intake.  Hydrate and repeat renal parameters in am. If no improvement check US renal for further evaluation.  Send for urine cultures.     Hypercalcemia:  ? From dehydration vs from lymphoma.  Get PTH and TSH.   GERD:  Stable.   Hypothyroidism:  Resume 125 mg daily for synthroid.  TSH wnl.    Hypokalemia:  Replaced.   DVT prophylaxis: lovenox Code Status:  DNR  Family Communication: Discussed with Husband over the phone.  Disposition Plan: pending clinical improvement.  Consults called: none. Discussed with Dr Maylon Peppers over the phone.  Admission status: med surg. Obs.    Hosie Poisson MD [facility] Pager [phone]  If 7PM-7AM, please contact night-coverage [email or web address] Password [facility]  06/16/2018, 5:12 PM

## 2018-06-16 NOTE — Telephone Encounter (Signed)
Appointment made for 1130a today

## 2018-06-16 NOTE — Plan of Care (Signed)

## 2018-06-16 NOTE — ED Provider Notes (Signed)
[facility] EMERGENCY DEPARTMENT Provider Note   CSN: [id number] Arrival date & time: 06/16/18  1220    History   Chief Complaint Chief Complaint  Patient presents with   Fatigue   Cough    HPI KYRIANNA BARLETTA is a 78 y.o. female.     HPI  78 year old female presents with generalized weakness.  She states she is felt this way for about 3 weeks.  She is also had a mild intermittent cough that is sometimes productive for these 3 weeks as well.  Occasionally she will vomit after coughing spells but no other emesis.  The weakness has progressively worsened and is diffuse with fatigue.  No fever, headache, chest pain, shortness of breath or abdominal pain.  She is having less oral intake because she states nothing tastes good anymore.  She was recently diagnosed with lymphoma but is not on treatment.  No leg swelling.  No urinary symptoms.  She feels thirsty.  She feels like she is having a little bit of blurry vision over the last 2 weeks but also has macular degeneration. No known contacts with COVID-19 patient. She estimates she's lost about 10 pounds unintentionally over a longer period of time than the weakness.  Past Medical History:  Diagnosis Date   Anemia    Arthritis    Constipation, chronic    GERD (gastroesophageal reflux disease)    zantac   Heart murmur    History of blood transfusion 1959   [place]   Hyperlipidemia    Hypothyroidism    Lymphoproliferative disorder ([place])    Macular degeneration 2013   Both eyes    Osteopenia    Pneumonia    PONV (postoperative nausea and vomiting)    needs little anesthesia   Shingles    Shortness of breath    on exertion   Spleen enlarged    SUI (stress urinary incontinence, female)    Wears glasses     Patient Active Problem List   Diagnosis Date Noted   Weakness 06/16/2018   AKI (acute kidney injury) ([place]) 06/16/2018   Bronchiectasis without complication ([place]) 13/24/4010   DOE  (dyspnea on exertion) 06/05/2018   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   Bronchospasm 04/24/2018   Fatigue 04/24/2018   Facial numbness 02/17/2017   Abnormal CT of the abdomen 10/27/2015   Elevated serum creatinine 10/27/2015   Idiopathic urethral stricture 06/21/2015   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[place]) 12/05/2014   Acute respiratory failure with hypoxia ([place]) 11/20/2014   HLD (hyperlipidemia) 11/17/2014   GERD (gastroesophageal reflux disease) 11/17/2014   Cervical lymphadenitis 12/06/2013   Family history of ovarian cancer 05/31/2013   CAP (community acquired pneumonia) 07/02/2012   Chest pain 07/02/2012   Abnormal CT scan, head 07/02/2012   Postmenopausal 03/10/2012   Family history of breast cancer 12/12/2011   IBS (irritable bowel syndrome) 12/12/2011   Abdominal bloating 12/12/2011   Chronic constipation 12/12/2011   CARPAL TUNNEL SYNDROME, LEFT 04/21/2009   GAIT DISTURBANCE 04/21/2009   HYPERLIPIDEMIA 01/12/2009   CERVICALGIA 09/12/2008   Hypothyroidism 08/06/2006   OSTEOPENIA 08/06/2006   URINARY INCONTINENCE 08/06/2006   SKIN CANCER, HX OF 08/06/2006    Past Surgical History:  Procedure Laterality Date   BREAST EXCISIONAL BIOPSY Left [place]   CATARACT EXTRACTION  2009, 2011   BOTH EYES   CESAREAN SECTION  1959   COLONOSCOPY      Dr Searles  X2   HYSTEROSCOPY W/D&C  01/07/2012   Procedure: DILATATION AND CURETTAGE /HYSTEROSCOPY;  Surgeon: Terrance Mass, MD;  Location: [facility];  Service: Gynecology;  Laterality: N/A;  intrauterine foley catheter for tamponode    LYMPH NODE BIOPSY Left 05/26/2018   Procedure: LEFT AXILLARY LYMPH NODE BIOPSY;  Surgeon: Fanny Skates, MD;  Location: [facility];  Service: General;  Laterality: Left;   TONSILLECTOMY     TONSILLECTOMY AND ADENOIDECTOMY     TUBAL LIGATION     BY LAPAROSCOPY   WISDOM TOOTH EXTRACTION       OB  History    Gravida  3   Para  3   Term      Preterm  2   AB      Living  2     SAB      TAB      Ectopic      Multiple      Live Births  2            Home Medications    Prior to Admission medications   Medication Sig Start Date End Date Taking? Authorizing Provider  acetaminophen (TYLENOL) 500 MG tablet Take 1,000 mg by mouth every 6 (six) hours as needed for mild pain.    [provider]  albuterol (PROVENTIL HFA;VENTOLIN HFA) 108 (90 Base) MCG/ACT inhaler Inhale 2 puffs into the lungs every 6 (six) hours as needed for up to 30 days for wheezing or shortness of breath. 05/22/18 06/21/18  Tish Men, MD  albuterol (PROVENTIL) (2.5 MG/3ML) 0.083% nebulizer solution Take 3 mLs (2.5 mg total) by nebulization every 6 (six) hours as needed for wheezing or shortness of breath. 04/24/18   Carollee Herter, Alferd Apa, DO  buPROPion (WELLBUTRIN XL) 150 MG 24 hr tablet TAKE 1 TABLET BY MOUTH EVERY DAY 02/10/18   [name], DO  famotidine (PEPCID) 20 MG tablet One at bedtime 06/05/18   Tanda Rockers, MD  levothyroxine (SYNTHROID, LEVOTHROID) 125 MCG tablet TAKE 1 TABLET BY MOUTH DAILY 05/15/18   [name], DO  Multiple Vitamins-Minerals (ICAPS) TABS Take 2 tablets by mouth daily.     [provider]  omeprazole (PRILOSEC) 20 MG capsule Take 2 x 30 min before breakfast 06/05/18   Tanda Rockers, MD  ondansetron (ZOFRAN) 4 MG tablet Take 1 tablet (4 mg total) by mouth every 8 (eight) hours as needed for nausea or vomiting. 06/08/18   [name], DO    Family History Family History  Problem Relation Age of Onset   Ovarian cancer Mother    Breast cancer Mother 61   Hypertension Father    Prostate cancer Father    Kidney failure Father    Diabetes Father    Hyperlipidemia Brother    COPD Paternal Grandfather    Stroke Maternal Grandfather     Social History Social History   Tobacco Use   Smoking status: Never Smoker    Smokeless tobacco: Never Used  Substance Use Topics   Alcohol use: Yes    Alcohol/week: 0.0 standard drinks    Comment: Red wine occasionally   Drug use: No     Allergies   Phenergan [promethazine hcl]; Levofloxacin; and Pravastatin sodium   Review of Systems Review of Systems  Constitutional: Positive for fatigue and unexpected weight change. Negative for fever.  Respiratory: Positive for cough. Negative for shortness of breath.   Cardiovascular: Negative for chest pain and leg  swelling.  Gastrointestinal: Positive for vomiting (post tussive). Negative for abdominal pain.  Genitourinary: Negative for dysuria.  Neurological: Positive for weakness. Negative for headaches.  All other systems reviewed and are negative.    Physical Exam Updated Vital Signs BP (!) 143/89 (BP Location: Left Arm)    Pulse 80    Temp 98.3 F (36.8 C) (Oral)    Resp 16    Ht 5' 1.5" (1.562 m)    Wt 61.7 kg    SpO2 97%    BMI 25.28 kg/m   Physical Exam Vitals signs and nursing note reviewed.  Constitutional:      Appearance: She is well-developed.  HENT:     Head: Normocephalic and atraumatic.     Right Ear: External ear normal.     Left Ear: External ear normal.     Nose: Nose normal.  Eyes:     General:        Right eye: No discharge.        Left eye: No discharge.  Cardiovascular:     Rate and Rhythm: Normal rate and regular rhythm.     Heart sounds: Normal heart sounds.  Pulmonary:     Effort: Pulmonary effort is normal.     Breath sounds: Normal breath sounds. No wheezing or rales.  Abdominal:     Palpations: Abdomen is soft.     Tenderness: There is no abdominal tenderness.  Skin:    General: Skin is warm and dry.  Neurological:     Mental Status: She is alert.     Comments: CN 3-12 grossly intact. Mild generalized weakness but is 5/5 strength in all 4 extremities. Grossly normal sensation. Normal finger to nose.   Psychiatric:        Mood and Affect: Mood is not anxious.       ED Treatments / Results  Labs (all labs ordered are listed, but only abnormal results are displayed) Labs Reviewed  COMPREHENSIVE METABOLIC PANEL - Abnormal; Notable for the following components:      Result Value   Potassium 3.4 (*)    Glucose, Bld 119 (*)    BUN 27 (*)    Creatinine, Ser 1.85 (*)    Calcium 13.0 (*)    GFR calc non Af Amer 25 (*)    GFR calc Af Amer 29 (*)    All other components within normal limits  CBC WITH DIFFERENTIAL/PLATELET - Abnormal; Notable for the following components:   Abs Immature Granulocytes 0.17 (*)    All other components within normal limits  CBG MONITORING, ED - Abnormal; Notable for the following components:   Glucose-Capillary 107 (*)    All other components within normal limits  TROPONIN I  URINALYSIS, ROUTINE W REFLEX MICROSCOPIC    EKG EKG Interpretation  Date/Time:  Tuesday June 16 2018 13:08:52 EDT Ventricular Rate:  67 PR Interval:    QRS Duration: 90 QT Interval:  372 QTC Calculation: 393 R Axis:   28 Text Interpretation:  Sinus rhythm no acute ST/T changes no significant change compared to April 2014 Confirmed by Sherwood Gambler [phone]) on 06/16/2018 1:13:11 PM   Radiology Dg Chest 2 View  Result Date: 06/16/2018 CLINICAL DATA:  Three weeks of coughing. Recent diagnosis of lymphoma. EXAM: CHEST - 2 VIEW COMPARISON:  May 18, 2018 FINDINGS: Continued prominence of the mediastinum consistent with known adenopathy/lymphoma. The cardiomediastinal silhouette is stable. No pneumothorax. Interstitial and parenchymal opacity in the bases persists but is mildly improved since May 18, 2018. No other changes. IMPRESSION: 1. Continued adenopathy in the mediastinum. 2. Continued interstitial and parenchymal opacities in the bases, mildly improved since May 18, 2018. These opacities were favored to represent lymphomatous involvement based on a PET-CT from May 29, 2018. Electronically Signed   By: Dorise Bullion III M.D    On: 06/16/2018 13:09    Procedures Procedures (including critical care time)  Medications Ordered in ED Medications  potassium chloride SA (K-DUR,KLOR-CON) CR tablet 20 mEq (has no administration in time range)  lactated ringers bolus 1,000 mL (0 mLs Intravenous Stopped 06/16/18 1403)  0.9 %  sodium chloride infusion ( Intravenous New Bag/Given 06/16/18 1419)     Initial Impression / Assessment and Plan / ED Course  I have reviewed the triage vital signs and the nursing notes.  Pertinent labs & imaging results that were available during my care of the patient were reviewed by me and considered in my medical decision making (see chart for details).        Patient's generalized weakness is probably from the acute kidney injury in addition to the hypercalcemia.  She was given 1 L IV fluid bolus during the work-up and now we will continue IV hydration.  She has a shorter than typical QTC but not critically short.  Mild hypokalemia repleted orally.  At this point, I think she will need further work-up of the hypercalcemia and admission to the hospitalist service.  Discussed with Dr. Karleen Hampshire for admission to [facility].  Final Clinical Impressions(s) / ED Diagnoses   Final diagnoses:  Hypercalcemia  Acute kidney injury [facility])    ED Discharge Orders    None       Sherwood Gambler, MD 06/16/18 1423

## 2018-06-16 NOTE — Progress Notes (Signed)
Virtual Visit via Video Note  I connected with Amanda [name] on 06/16/18 at 11:30 AM EDT by a video enabled telemedicine application and verified that I am speaking with the correct person using two identifiers.   I discussed the limitations of evaluation and management by telemedicine and the availability of in person appointments. The patient expressed understanding and agreed to proceed.  History of Present Illness: Pt is home with her husband c/o extreme weakness and nausea  X 3 weeks   She has been unable to eat/ drink. No fevers,  Some pnd and some nausea and vomiting  Pt recently dx with lymphoma     Observations/Objective: 144/86  73 sitting  Wt 134   Standing  -- pt became too dizzy and had to sit down Pt pale looking and very weak   Assessment and Plan: 1. Weakness Pt extremely weak and orthostatic--- advised her husband to take her to the ER    Follow Up Instructions:    I discussed the assessment and treatment plan with the patient. The patient was provided an opportunity to ask questions and all were answered. The patient agreed with the plan and demonstrated an understanding of the instructions.   The patient was advised to call back or seek an in-person evaluation if the symptoms worsen or if the condition fails to improve as anticipated.    [name], DO

## 2018-06-16 NOTE — ED Triage Notes (Signed)
Per pt and husband pt with decreased appetite, n/v, weakness, weight loss x 3 weeks-pt assisted from car to w/c to triage-NAD

## 2018-06-17 DIAGNOSIS — I160 Hypertensive urgency: Secondary | ICD-10-CM | POA: Diagnosis present

## 2018-06-17 DIAGNOSIS — H3530 Unspecified macular degeneration: Secondary | ICD-10-CM | POA: Diagnosis present

## 2018-06-17 DIAGNOSIS — C8580 Other specified types of non-Hodgkin lymphoma, unspecified site: Secondary | ICD-10-CM

## 2018-06-17 DIAGNOSIS — E785 Hyperlipidemia, unspecified: Secondary | ICD-10-CM | POA: Diagnosis present

## 2018-06-17 DIAGNOSIS — K219 Gastro-esophageal reflux disease without esophagitis: Secondary | ICD-10-CM | POA: Diagnosis present

## 2018-06-17 DIAGNOSIS — Z8249 Family history of ischemic heart disease and other diseases of the circulatory system: Secondary | ICD-10-CM | POA: Diagnosis not present

## 2018-06-17 DIAGNOSIS — D649 Anemia, unspecified: Secondary | ICD-10-CM | POA: Diagnosis not present

## 2018-06-17 DIAGNOSIS — I129 Hypertensive chronic kidney disease with stage 1 through stage 4 chronic kidney disease, or unspecified chronic kidney disease: Secondary | ICD-10-CM | POA: Diagnosis present

## 2018-06-17 DIAGNOSIS — K5900 Constipation, unspecified: Secondary | ICD-10-CM | POA: Diagnosis present

## 2018-06-17 DIAGNOSIS — Z20828 Contact with and (suspected) exposure to other viral communicable diseases: Secondary | ICD-10-CM | POA: Diagnosis present

## 2018-06-17 DIAGNOSIS — R161 Splenomegaly, not elsewhere classified: Secondary | ICD-10-CM | POA: Diagnosis present

## 2018-06-17 DIAGNOSIS — E876 Hypokalemia: Secondary | ICD-10-CM | POA: Diagnosis present

## 2018-06-17 DIAGNOSIS — Z79890 Hormone replacement therapy (postmenopausal): Secondary | ICD-10-CM | POA: Diagnosis not present

## 2018-06-17 DIAGNOSIS — E860 Dehydration: Secondary | ICD-10-CM | POA: Diagnosis present

## 2018-06-17 DIAGNOSIS — N179 Acute kidney failure, unspecified: Principal | ICD-10-CM

## 2018-06-17 DIAGNOSIS — M1990 Unspecified osteoarthritis, unspecified site: Secondary | ICD-10-CM | POA: Diagnosis present

## 2018-06-17 DIAGNOSIS — D631 Anemia in chronic kidney disease: Secondary | ICD-10-CM | POA: Diagnosis present

## 2018-06-17 DIAGNOSIS — C8300 Small cell B-cell lymphoma, unspecified site: Secondary | ICD-10-CM | POA: Diagnosis present

## 2018-06-17 DIAGNOSIS — Z803 Family history of malignant neoplasm of breast: Secondary | ICD-10-CM | POA: Diagnosis not present

## 2018-06-17 DIAGNOSIS — Z66 Do not resuscitate: Secondary | ICD-10-CM | POA: Diagnosis present

## 2018-06-17 DIAGNOSIS — Z8041 Family history of malignant neoplasm of ovary: Secondary | ICD-10-CM | POA: Diagnosis not present

## 2018-06-17 DIAGNOSIS — E039 Hypothyroidism, unspecified: Secondary | ICD-10-CM | POA: Diagnosis present

## 2018-06-17 DIAGNOSIS — N183 Chronic kidney disease, stage 3 (moderate): Secondary | ICD-10-CM | POA: Diagnosis present

## 2018-06-17 DIAGNOSIS — M8580 Other specified disorders of bone density and structure, unspecified site: Secondary | ICD-10-CM | POA: Diagnosis present

## 2018-06-17 LAB — MAGNESIUM: Magnesium: 1.6 mg/dL — ABNORMAL LOW (ref 1.7–2.4)

## 2018-06-17 LAB — BASIC METABOLIC PANEL
Anion gap: 7 (ref 5–15)
Anion gap: 8 (ref 5–15)
BUN: 21 mg/dL (ref 8–23)
BUN: 20 mg/dL (ref 8–23)
CO2: 24 mmol/L (ref 22–32)
CO2: 24 mmol/L (ref 22–32)
Calcium: 11 mg/dL — ABNORMAL HIGH (ref 8.9–10.3)
Calcium: 11.3 mg/dL — ABNORMAL HIGH (ref 8.9–10.3)
Chloride: 109 mmol/L (ref 98–111)
Chloride: 108 mmol/L (ref 98–111)
Creatinine, Ser: 1.62 mg/dL — ABNORMAL HIGH (ref 0.44–1.00)
Creatinine, Ser: 1.57 mg/dL — ABNORMAL HIGH (ref 0.44–1.00)
GFR calc Af Amer: 35 mL/min — ABNORMAL LOW (ref >60)
GFR calc Af Amer: 36 mL/min — ABNORMAL LOW (ref >60)
GFR calc non Af Amer: 30 mL/min — ABNORMAL LOW (ref >60)
GFR calc non Af Amer: 31 mL/min — ABNORMAL LOW (ref >60)
Glucose, Bld: 88 mg/dL (ref 70–99)
Glucose, Bld: 96 mg/dL (ref 70–99)
Potassium: 3.6 mmol/L (ref 3.5–5.1)
Potassium: 3.4 mmol/L — ABNORMAL LOW (ref 3.5–5.1)
Sodium: 140 mmol/L (ref 135–145)
Sodium: 140 mmol/L (ref 135–145)

## 2018-06-17 LAB — CBC
HCT: 32.8 % — ABNORMAL LOW (ref 36.0–46.0)
Hemoglobin: 10.5 g/dL — ABNORMAL LOW (ref 12.0–15.0)
MCH: 26.6 pg (ref 26.0–34.0)
MCHC: 32 g/dL (ref 30.0–36.0)
MCV: 83.2 fL (ref 80.0–100.0)
Platelets: 200 10*3/uL (ref 150–400)
RBC: 3.94 MIL/uL (ref 3.87–5.11)
RDW: 14.6 % (ref 11.5–15.5)
WBC: 6.4 10*3/uL (ref 4.0–10.5)
nRBC: 0 % (ref 0.0–0.2)

## 2018-06-17 LAB — PTH, INTACT AND CALCIUM
Calcium, Total (PTH): 12.3 mg/dL — ABNORMAL HIGH (ref 8.7–10.3)
PTH: 9 pg/mL — ABNORMAL LOW (ref 15–65)

## 2018-06-17 MED ORDER — DIPHENHYDRAMINE-ZINC ACETATE 2-0.1 % EX CREA
TOPICAL_CREAM | Freq: Two times a day (BID) | CUTANEOUS | Status: DC | PRN
  Filled 2018-06-17: qty 28

## 2018-06-17 MED ORDER — ENSURE ENLIVE PO LIQD
237.0000 mL | Freq: Two times a day (BID) | ORAL | Status: DC
  Administered 2018-06-18 – 2018-06-19 (×2): 237 mL via ORAL

## 2018-06-17 MED ORDER — AMLODIPINE BESYLATE 5 MG PO TABS
5.0000 mg | ORAL_TABLET | Freq: Every day | ORAL | Status: DC
  Administered 2018-06-17 – 2018-06-20 (×4): 5 mg via ORAL
  Filled 2018-06-17 (×4): qty 1

## 2018-06-17 NOTE — Evaluation (Signed)
Occupational Therapy Evaluation Patient Details Name: Amanda [name] MRN: [id number] DOB: 08-Sep-1940 Today's Date: 06/17/2018    History of Present Illness pt was admitted for AKI; she was fatiqued for 2-3 weeks prior to admission.  Has IV NHL and recurrent pna   Clinical Impression   This 78 year old female was admitted for the above. At baseline, she is independent with adls. She has not felt well since January, with bronchitis, pna, etc.  She currently needs min A for adls. Will follow in acute setting with supervision level goals. Pt's husband can do IADLs.    Follow Up Recommendations  Supervision/Assistance - 24 hour    Equipment Recommendations  None recommended by OT    Recommendations for Other Services       Precautions / Restrictions Precautions Precautions: Fall Restrictions Weight Bearing Restrictions: No      Mobility Bed Mobility               General bed mobility comments: min A for trunk for OOB  Transfers Overall transfer level: Needs assistance Equipment used: Rolling walker (2 wheeled);None Transfers: Sit to/from Stand Sit to Stand: Min guard;Min assist         General transfer comment: did better with RW    Balance                                           ADL either performed or assessed with clinical judgement   ADL Overall ADL's : Needs assistance/impaired Eating/Feeding: Independent   Grooming: Wash/dry hands;Set up   Upper Body Bathing: Set up   Lower Body Bathing: Minimal assistance   Upper Body Dressing : Set up   Lower Body Dressing: Minimal assistance   Toilet Transfer: Minimal assistance;Stand-pivot;BSC;RW   Toileting- Clothing Manipulation and Hygiene: Min guard;Sit to/from stand(semi stood)         General ADL Comments: Performed SPT to [facility] then walked in hall with RW. Much steadier with RW.  Educated on energy conservation. She is thinking about getting a tub seat and may be able to borrow  one from her church     Vision         Perception     Praxis      Pertinent Vitals/Pain Pain Assessment: Faces Faces Pain Scale: Hurts a little bit Pain Location: under L arm Pain Descriptors / Indicators: Sore Pain Intervention(s): Limited activity within patient's tolerance;Monitored during session     Hand Dominance     Extremity/Trunk Assessment Upper Extremity Assessment Upper Extremity Assessment: LUE deficits/detail(can lift to 90; has a stitch from bx)           Communication Communication Communication: No difficulties   Cognition Arousal/Alertness: Awake/alert Behavior During Therapy: WFL for tasks assessed/performed Overall Cognitive Status: Within Functional Limits for tasks assessed                                     General Comments       Exercises     Shoulder Instructions      Home Living Family/patient expects to be discharged to:: Private residence Living Arrangements: Spouse/significant other Available Help at Discharge: Available 24 hours/day               Bathroom Shower/Tub: Teacher, early years/pre: Standard  Additional Comments: husband uses a cane. She may be able to borrow walker and shower seat from church. She does not want anything to raise toilet height      Prior Functioning/Environment          Comments: normally, independent; has struggled for a few weeks        OT Problem List: Decreased strength;Decreased activity tolerance;Impaired balance (sitting and/or standing);Decreased knowledge of use of DME or AE      OT Treatment/Interventions: Self-care/ADL training;DME and/or AE instruction;Patient/family education;Balance training;Therapeutic activities;Energy conservation    OT Goals(Current goals can be found in the care plan section) Acute Rehab OT Goals Patient Stated Goal: regain strength OT Goal Formulation: With patient Time For Goal Achievement:  07/01/18 Potential to Achieve Goals: Good ADL Goals Pt Will Transfer to Toilet: with supervision;ambulating;regular height toilet Pt Will Perform Tub/Shower Transfer: Tub transfer;with min guard assist;grab bars;shower seat Additional ADL Goal #1: pt will complete adl with set up/supervision, initiating at least one rest break without cues  OT Frequency: Min 2X/week   Barriers to D/C:            Co-evaluation PT/OT/SLP Co-Evaluation/Treatment: Yes Reason for Co-Treatment: For patient/therapist safety PT goals addressed during session: Mobility/safety with mobility OT goals addressed during session: ADL's and self-care      AM-PAC OT "6 Clicks" Daily Activity     Outcome Measure Help from another person eating meals?: None Help from another person taking care of personal grooming?: A Little Help from another person toileting, which includes using toliet, bedpan, or urinal?: A Little Help from another person bathing (including washing, rinsing, drying)?: A Little Help from another person to put on and taking off regular upper body clothing?: A Little Help from another person to put on and taking off regular lower body clothing?: A Little 6 Click Score: 19   End of Session    Activity Tolerance: Patient tolerated treatment well Patient left: in chair;with call bell/phone within reach;with chair alarm set  OT Visit Diagnosis: Muscle weakness (generalized) (M62.81);Unsteadiness on feet (R26.81)                Time: 6579-0383 OT Time Calculation (min): 31 min Charges:  OT General Charges $OT Visit: 1 Visit OT Evaluation $OT Eval Low Complexity: Katie, OTR/L Acute Rehabilitation Services [phone] [facility] pager [phone] office 06/17/2018  [place] 06/17/2018, 12:30 PM

## 2018-06-17 NOTE — Care Management Obs Status (Signed)
[place] NOTIFICATION   Patient Details  Name: Amanda [name] MRN: [id number] Date of Birth: [birth year]   Medicare Observation Status Notification Given:  Yes    Purcell Mouton, RN 06/17/2018, 1:38 PM

## 2018-06-17 NOTE — Progress Notes (Signed)
PROGRESS NOTE    Amanda [name]  XVQ:[id number] DOB: 09-27-1940 DOA: 06/16/2018 PCP: [name], DO   Brief Narrative: Amanda [name] is a 78 y.o. female with medical history significant of with lymphoma, hypertension. Hypothyroidism, splenomegaly, GERD, stage 4 NHL, low grade nodal marginal zone lymphoma. Patient presented secondary to fatigue, loss of appetite, nausea/vomiting. Found to have calcium.   Assessment & Plan:   Active Problems:   AKI (acute kidney injury) ([place])   Acute kidney injury on CKD 3 Baseline creatinine of 1.1-1.2. Creatinine fo 1.85 on admission. Down to 1.62 today. Patient with poor appetite and oral intake. -Continue IV fluids, recheck BMP this afternoon and stop IV fluids if improved.  Low grade temp Unsure of etiology. Possibly related to lymphoma. Tested for SARS-Cov-19 which was negative. Normal WBC.  Stage 4 non-Hodgkin lymphoma Follows with medical oncology as an outpatient  Hypercalcemia Possibly from AKI vs CKD but also in the setting of lymphoma.  PTH is low. Improved with IV fluids. -Continue IV fluids for now and as mentioned above -Recommend discontinuing calcium contain medications/supplements  GERD -Continue Protonix  Hypothyroidism -Continue Synthroid 125 mcg  Hypertensive urgency Unknown etiology. Not on medications as an outpatient. Receiving hydralazine IV prn inpatient -Start amlodipine 5 mg daily   DVT prophylaxis: Lovenox Code Status:   Code Status: DNR Family Communication: None at bedside Disposition Plan: Discharge in 24 hours once calcium stable off of IV fluids   Consultants:   None  Procedures:   None  Antimicrobials:  None    Subjective: Weakness/fatigue improved today compared to yesterday.  Objective: Vitals:   06/16/18 1952 06/16/18 2052 06/17/18 0535 06/17/18 1213  BP: (!) 195/76 (!) 161/68 (!) 169/70 (!) 157/76  Pulse: 79 94 72 68  Resp: (!) 24  17 18   Temp: 98.7 F (37.1 C)  98.6  F (37 C) 98.1 F (36.7 C)  TempSrc: Oral  Oral Oral  SpO2: 99% 97% 97% 98%  Weight:      Height:        Intake/Output Summary (Last 24 hours) at 06/17/2018 1246 Last data filed at 06/17/2018 1215 Gross per 24 hour  Intake 2842.81 ml  Output 900 ml  Net 1942.81 ml   Filed Weights   06/16/18 1227 06/16/18 1856  Weight: 61.7 kg 65.1 kg    Examination:  General exam: Appears calm and comfortable Respiratory system: Clear to auscultation. Respiratory effort normal. Cardiovascular system: S1 & S2 heard, RRR. No murmurs, rubs, gallops or clicks. Gastrointestinal system: Abdomen is nondistended, soft and nontender. No organomegaly or masses felt. Normal bowel sounds heard. Central nervous system: Alert and oriented. No focal neurological deficits. Extremities: No edema. No calf tenderness Skin: No cyanosis. No rashes Psychiatry: Judgement and insight appear normal. Flat affect     Data Reviewed: I have personally reviewed following labs and imaging studies  CBC: Recent Labs  Lab 06/16/18 1313 06/17/18 0525  WBC 8.2 6.4  NEUTROABS 5.4  --   HGB 12.6 10.5*  HCT 39.6 32.8*  MCV 81.6 83.2  PLT 245 093   Basic Metabolic Panel: Recent Labs  Lab 06/16/18 1313 06/16/18 1639 06/17/18 0525  NA 137  --  140  K 3.4*  --  3.6  CL 100  --  109  CO2 27  --  24  GLUCOSE 119*  --  88  BUN 27*  --  21  CREATININE 1.85*  --  1.62*  CALCIUM 13.0* 12.3* 11.0*  GFR: Estimated Creatinine Clearance: 24.6 mL/min (A) (by C-G formula based on SCr of 1.62 mg/dL (H)). Liver Function Tests: Recent Labs  Lab 06/16/18 1313  AST 24  ALT 14  ALKPHOS 61  BILITOT 0.4  PROT 6.9  ALBUMIN 4.0   No results for input(s): LIPASE, AMYLASE in the last 168 hours. No results for input(s): AMMONIA in the last 168 hours. Coagulation Profile: No results for input(s): INR, PROTIME in the last 168 hours. Cardiac Enzymes: Recent Labs  Lab 06/16/18 1313  TROPONINI <0.03   BNP (last 3  results) No results for input(s): PROBNP in the last 8760 hours. HbA1C: No results for input(s): HGBA1C in the last 72 hours. CBG: Recent Labs  Lab 06/16/18 1248  GLUCAP 107*   Lipid Profile: No results for input(s): CHOL, HDL, LDLCALC, TRIG, CHOLHDL, LDLDIRECT in the last 72 hours. Thyroid Function Tests: Recent Labs    06/16/18 1639  TSH 0.582   Anemia Panel: No results for input(s): VITAMINB12, FOLATE, FERRITIN, TIBC, IRON, RETICCTPCT in the last 72 hours. Sepsis Labs: No results for input(s): PROCALCITON, LATICACIDVEN in the last 168 hours.  Recent Results (from the past 240 hour(s))  SARS Coronavirus 2 [facility] order, Performed in [place] hospital lab)     Status: None   Collection Time: 06/16/18  5:27 PM  Result Value Ref Range Status   SARS Coronavirus 2 NEGATIVE NEGATIVE Final    Comment: (NOTE) If result is NEGATIVE SARS-CoV-2 target nucleic acids are NOT DETECTED. The SARS-CoV-2 RNA is generally detectable in upper and lower  respiratory specimens during the acute phase of infection. The lowest  concentration of SARS-CoV-2 viral copies this assay can detect is 250  copies / mL. A negative result does not preclude SARS-CoV-2 infection  and should not be used as the sole basis for treatment or other  patient management decisions.  A negative result may occur with  improper specimen collection / handling, submission of specimen other  than nasopharyngeal swab, presence of viral mutation(s) within the  areas targeted by this assay, and inadequate number of viral copies  (<250 copies / mL). A negative result must be combined with clinical  observations, patient history, and epidemiological information. If result is POSITIVE SARS-CoV-2 target nucleic acids are DETECTED. The SARS-CoV-2 RNA is generally detectable in upper and lower  respiratory specimens dur ing the acute phase of infection.  Positive  results are indicative of active infection with SARS-CoV-2.   Clinical  correlation with patient history and other diagnostic information is  necessary to determine patient infection status.  Positive results do  not rule out bacterial infection or co-infection with other viruses. If result is PRESUMPTIVE POSTIVE SARS-CoV-2 nucleic acids MAY BE PRESENT.   A presumptive positive result was obtained on the submitted specimen  and confirmed on repeat testing.  While 2019 novel coronavirus  (SARS-CoV-2) nucleic acids may be present in the submitted sample  additional confirmatory testing may be necessary for epidemiological  and / or clinical management purposes  to differentiate between  SARS-CoV-2 and other Sarbecovirus currently known to infect humans.  If clinically indicated additional testing with an alternate test  methodology [phone]) is advised. The SARS-CoV-2 RNA is generally  detectable in upper and lower respiratory sp ecimens during the acute  phase of infection. The expected result is Negative. Fact Sheet for Patients:  StrictlyIdeas.no Fact Sheet for Healthcare Providers: BankingDealers.co.za This test is not yet approved or cleared by the [place] FDA and has been authorized  for detection and/or diagnosis of SARS-CoV-2 by FDA under an Emergency Use Authorization (EUA).  This EUA will remain in effect (meaning this test can be used) for the duration of the COVID-19 declaration under Section 564(b)(1) of the Act, 21 U.S.C. section 360bbb-3(b)(1), unless the authorization is terminated or revoked sooner. Performed at [facility], [place] [address], [place], [place] [address]          Radiology Studies: Dg Chest 2 View  Result Date: 06/16/2018 CLINICAL DATA:  Three weeks of coughing. Recent diagnosis of lymphoma. EXAM: CHEST - 2 VIEW COMPARISON:  May 18, 2018 FINDINGS: Continued prominence of the mediastinum consistent with known adenopathy/lymphoma. The cardiomediastinal  silhouette is stable. No pneumothorax. Interstitial and parenchymal opacity in the bases persists but is mildly improved since May 18, 2018. No other changes. IMPRESSION: 1. Continued adenopathy in the mediastinum. 2. Continued interstitial and parenchymal opacities in the bases, mildly improved since May 18, 2018. These opacities were favored to represent lymphomatous involvement based on a PET-CT from May 29, 2018. Electronically Signed   By: Dorise Bullion III M.D   On: 06/16/2018 13:09        Scheduled Meds: . buPROPion  150 mg Oral Daily  . enoxaparin (LOVENOX) injection  30 mg Subcutaneous Daily  . feeding supplement (ENSURE ENLIVE)  237 mL Oral BID BM  . levothyroxine  125 mcg Oral Q0600  . pantoprazole  40 mg Oral Daily   Continuous Infusions: . sodium chloride 100 mL/hr at 06/17/18 0500  . cefTRIAXone (ROCEPHIN)  IV 1 g (06/16/18 2308)     LOS: 0 days     Cordelia Poche, MD [facility] 06/17/2018, 12:46 PM  If 7PM-7AM, please contact night-coverage [email or web address]

## 2018-06-17 NOTE — Progress Notes (Signed)
Initial Nutrition Assessment  INTERVENTION:   Provide Ensure Enlive po BID, each supplement provides 350 kcal and 20 grams of protein  NUTRITION DIAGNOSIS:   Increased nutrient needs related to cancer and cancer related treatments as evidenced by estimated needs.  GOAL:   Patient will meet greater than or equal to 90% of their needs  MONITOR:   PO intake, Supplement acceptance, Labs, Weight trends, I & O's  REASON FOR ASSESSMENT:   Consult Assessment of nutrition requirement/status  ASSESSMENT:   78 y.o. female with medical history significant of with lymphoma, hypertension. Hypothyroidism, splenomegaly, GERD, stage 4 NHL, low grade nodal marginal zone lymphoma no indication of treatment as per Dr Maylon Peppers,  recent recurrent pneumonia, completed two rounds of antibiotics , followed up with Dr Melvyn Novas for bronchiectasis,  lymphadenopathy, presents to [facility] for worsening fatigue, appetite loss, nausea,vomiting with dry cough. Patient was tested for COVID-19- results are negative.  **RD working remotely**  Patient followed by [facility] RD, last assessed on 4/7. At that time, pt was reporting taste changes, and poor appetite. Pt was attempting to eat 3 meals a day and was drinking homemade protein smoothies.   Pt now reports she has had some N/V and has been unable to eat or drink. Pt ordered french toast, yogurt, and fruit salad for breakfast. Will monitor PO intakes this admission. Will order Ensure supplements while admitted but pt can continue homemade protein smoothies once home.  Patient reports weight loss of 10 lb over the past several weeks. UBW is ~147 lb. Per weight records, pt has lost 8 lb since 03/25/18 (5% wt loss x 3 months, insignificant for time frame).  Medications reviewed. Labs reviewed: GFR: 30   NUTRITION - FOCUSED PHYSICAL EXAM:  Unable to perform per department requirements to work remotely.  Diet Order:   Diet Order            Diet regular Room service  appropriate? Yes; Fluid consistency: Thin  Diet effective now              EDUCATION NEEDS:   No education needs have been identified at this time  Skin:  Skin Assessment: Reviewed RN Assessment  Last BM:  4/10  Height:   Ht Readings from Last 1 Encounters:  06/16/18 5' 1.5" (1.562 m)    Weight:   Wt Readings from Last 1 Encounters:  06/16/18 65.1 kg    Ideal Body Weight:  50 kg  BMI:  Body mass index is 26.68 kg/m.  Estimated Nutritional Needs:   Kcal:  1600-1800  Protein:  80-90g  Fluid:  1.8L/day  Amanda Bibles, MS, RD, LDN [place] [facility] Pager: [phone] After Hours Pager: [phone]

## 2018-06-17 NOTE — Evaluation (Signed)
Physical Therapy Evaluation Patient Details Name: Amanda [name] MRN: [id number] DOB: 03-27-1940 Today's Date: 06/17/2018   History of Present Illness  pt was admitted for AKI; she was fatiqued for 2-3 weeks prior to admission.  Has IV NHL and recurrent pna  Clinical Impression  The patient demonstrates decreased strength and endurance. Did rely on RW for stability. Recommend RW for home. Pt admitted with above diagnosis. Pt currently with functional limitations due to the deficits listed below (see PT Problem List). Pt will benefit from skilled PT to increase their independence and safety with mobility to allow discharge to the venue listed below.       Follow Up Recommendations Home health PT(may not require)    Equipment Recommendations  Rolling walker with 5" wheels(may not require)    Recommendations for Other Services       Precautions / Restrictions Precautions Precautions: Fall Restrictions Weight Bearing Restrictions: No      Mobility  Bed Mobility               General bed mobility comments: min A for trunk for OOB  Transfers Overall transfer level: Needs assistance Equipment used: Rolling walker (2 wheeled);None Transfers: Sit to/from Stand Sit to Stand: Min guard;Min assist         General transfer comment: did better with RW  Ambulation/Gait Ambulation/Gait assistance: Min assist Gait Distance (Feet): 110 Feet Assistive device: Rolling walker (2 wheeled) Gait Pattern/deviations: Step-through pattern;Decreased stride length Gait velocity: decr   General Gait Details: gait is slow, reliant on support of RW.  Stairs            Wheelchair Mobility    Modified Rankin (Stroke Patients Only)       Balance Overall balance assessment: Needs assistance Sitting-balance support: Feet supported;No upper extremity supported Sitting balance-Leahy Scale: Good     Standing balance support: During functional activity;Single extremity  supported Standing balance-Leahy Scale: Fair Standing balance comment: steady assist during pericare in standing                             Pertinent Vitals/Pain Pain Assessment: Faces Faces Pain Scale: Hurts a little bit Pain Location: under L arm Pain Descriptors / Indicators: Sore Pain Intervention(s): Limited activity within patient's tolerance;Monitored during session    Home Living Family/patient expects to be discharged to:: Private residence Living Arrangements: Spouse/significant other Available Help at Discharge: Available 24 hours/day Type of Home: House Home Access: Stairs to enter Entrance Stairs-Rails: Psychiatric nurse of Steps: 4 Home Layout: One level   Additional Comments: husband uses a cane. She may be able to borrow walker and shower seat from church. She does not want anything to raise toilet height    Prior Function           Comments: normally, independent; has struggled for a few weeks     Hand Dominance        Extremity/Trunk Assessment   Upper Extremity Assessment Upper Extremity Assessment: Defer to OT evaluation    Lower Extremity Assessment Lower Extremity Assessment: Generalized weakness    Cervical / Trunk Assessment Cervical / Trunk Assessment: Normal  Communication   Communication: No difficulties  Cognition Arousal/Alertness: Awake/alert Behavior During Therapy: WFL for tasks assessed/performed Overall Cognitive Status: Within Functional Limits for tasks assessed  General Comments      Exercises     Assessment/Plan    PT Assessment Patient needs continued PT services  PT Problem List Decreased strength;Decreased activity tolerance;Decreased mobility;Decreased balance       PT Treatment Interventions DME instruction;Gait training;Functional mobility training;Therapeutic activities    PT Goals (Current goals can be found in the  Care Plan section)  Acute Rehab PT Goals Patient Stated Goal: regain strength PT Goal Formulation: With patient Time For Goal Achievement: 06/24/18 Potential to Achieve Goals: Good    Frequency Min 3X/week   Barriers to discharge        Co-evaluation PT/OT/SLP Co-Evaluation/Treatment: Yes Reason for Co-Treatment: For patient/therapist safety PT goals addressed during session: Mobility/safety with mobility OT goals addressed during session: ADL's and self-care       AM-PAC PT "6 Clicks" Mobility  Outcome Measure Help needed turning from your back to your side while in a flat bed without using bedrails?: A Lot Help needed moving from lying on your back to sitting on the side of a flat bed without using bedrails?: A Lot Help needed moving to and from a bed to a chair (including a wheelchair)?: A Lot Help needed standing up from a chair using your arms (e.g., wheelchair or bedside chair)?: A Lot Help needed to walk in hospital room?: A Lot Help needed climbing 3-5 steps with a railing? : A Lot 6 Click Score: 12    End of Session Equipment Utilized During Treatment: Gait belt Activity Tolerance: Patient tolerated treatment well Patient left: in chair;with call bell/phone within reach;with chair alarm set Nurse Communication: Mobility status PT Visit Diagnosis: Unsteadiness on feet (R26.81)    Time: 1572-6203 PT Time Calculation (min) (ACUTE ONLY): 23 min   Charges:   PT Evaluation $PT Eval Low Complexity: 1 Low          [place] Pager [phone] Office [phone]   Claretha Cooper 06/17/2018, 12:58 PM

## 2018-06-17 NOTE — Care Management Obs Status (Signed)
[place] NOTIFICATION   Patient Details  Name: Amanda [name] MRN: [id number] Date of Birth: Sep 11, 1940   Medicare Observation Status Notification Given:  Yes    Purcell Mouton, RN 06/17/2018, 1:53 PM

## 2018-06-17 NOTE — Progress Notes (Deleted)
HEMATOLOGY-ONCOLOGY PROGRESS NOTE  SUBJECTIVE: Patient reports fatigue.  States that prior to admission really could not get out of bed very much.  She denies having any fevers or chills.  Denies night sweats.  Reports itching over her scalp and her chest.  Reports ongoing cervical adenopathy which she thinks is unchanged.  Has trouble remembering things.  The patient reports that she takes a multivitamin but denies taking any herbal supplements.  States that she was given medication for her stomach and is currently taking 2 medications. One of them she knows is Pepcid.  She is unsure what the other one is but states that she may be taking Tums.  She was unable to quantify for me how much of this she is taking.  REVIEW OF SYSTEMS:   Constitutional: Reports fatigue and generalized weakness.  Denies fevers, chills Eyes: Denies blurriness of vision Ears, nose, mouth, throat, and face: Denies mucositis or sore throat Respiratory: Denies cough, dyspnea or wheezes Cardiovascular: Denies palpitation, chest discomfort Gastrointestinal:  Denies nausea, heartburn or change in bowel habits Skin: Reports itching. Lymphatics: Denies new lymphadenopathy or easy bruising Neurological:Denies numbness, tingling or new weaknesses Behavioral/Psych: Mood is stable, no new changes  Extremities: No lower extremity edema All other systems were reviewed with the patient and are negative.  I have reviewed the past medical history, past surgical history, social history and family history with the patient and they are unchanged from previous note.   PHYSICAL EXAMINATION:  Vitals:   06/16/18 2052 06/17/18 0535  BP: (!) 161/68 (!) 169/70  Pulse: 94 72  Resp:  17  Temp:  98.6 F (37 C)  SpO2: 97% 97%   Filed Weights   06/16/18 1227 06/16/18 1856  Weight: 136 lb (61.7 kg) 143 lb 8.3 oz (65.1 kg)    GENERAL:alert, no distress and comfortable SKIN: skin color, texture, turgor are normal, no rashes or  significant lesions EYES: normal, Conjunctiva are pink and non-injected, sclera clear OROPHARYNX:no exudate, no erythema and lips, buccal mucosa, and tongue normal  NECK: supple, thyroid normal size, non-tender, without nodularity LYMPH: Small shotty bilateral cervical adenopathy, approximately 1 cm. LUNGS: clear to auscultation and percussion with normal breathing effort HEART: regular rate & rhythm and no murmurs and no lower extremity edema ABDOMEN:abdomen soft, non-tender and normal bowel sounds Musculoskeletal:no cyanosis of digits and no clubbing  NEURO: alert & oriented x 3 with fluent speech, no focal motor/sensory deficits  LABORATORY DATA:  I have reviewed the data as listed CMP Latest Ref Rng & Units 06/17/2018 06/16/2018 06/16/2018  Glucose 70 - 99 mg/dL 88 119(H) -  BUN 8 - 23 mg/dL 21 27(H) -  Creatinine 0.44 - 1.00 mg/dL 1.62(H) 1.85(H) -  Sodium 135 - 145 mmol/L 140 137 -  Potassium 3.5 - 5.1 mmol/L 3.6 3.4(L) -  Chloride 98 - 111 mmol/L 109 100 -  CO2 22 - 32 mmol/L 24 27 -  Calcium 8.9 - 10.3 mg/dL 11.0(H) 12.3(H) 13.0(H)  Total Protein 6.5 - 8.1 g/dL - 6.9 -  Total Bilirubin 0.3 - 1.2 mg/dL - 0.4 -  Alkaline Phos 38 - 126 U/L - 61 -  AST 15 - 41 U/L - 24 -  ALT 0 - 44 U/L - 14 -    Lab Results  Component Value Date   WBC 6.4 06/17/2018   HGB 10.5 (L) 06/17/2018   HCT 32.8 (L) 06/17/2018   MCV 83.2 06/17/2018   PLT 200 06/17/2018   NEUTROABS 5.4 06/16/2018  ASSESSMENT AND PLAN: 1. Hypercalcemia -Etiology is unclear.  May be related to her acute kidney injury from dehydration. -The patient reports that she has possibly been taking Tums at home.  She was really unsure and could not quantify how much she is taking if she is even taking this medication. -Continue aggressive hydration. -I have spoken with the pharmacy who advises that Delton See should be given as an outpatient.  We can set her up after discharge for consideration of Xgeva. -PTH elevated. Consider  checking PTHrP and Vitamin D 25 hydroxy.  2. Stage IV NHL, favoring nodal marginal zone lymphoma  -The patient has a low-grade marginal zone lymphoma. -Most recent PET scan showed diffuse lymphadenopathy but no bulky disease.  Other than fatigue, she does not have any associated symptoms such as unexplained fevers, weight loss, night sweats, or progressive lymphadenopathy. -Treatment is not indicated at this time.  She will be continue to monitor closely with labs and imaging.  3. Mild anemia -Likely dilutional.   -Hemoglobin prior to admission has been normal.  Continue to monitor.  Mikey Bussing, DNP, AGPCNP-BC, AOCNP

## 2018-06-17 NOTE — Progress Notes (Signed)
Pt's COVID test came back negative. NP on call, Baltazar Najjar, notified. Orders to d/c droplet and contact precautions placed. Pt informed of negative test results and reason for d/c precautions. Verbalizes understanding. Will continue to monitor.

## 2018-06-17 NOTE — Progress Notes (Signed)
Pt declined HH and DME at present time.

## 2018-06-17 NOTE — Consult Note (Signed)
[place]  [phone] [phone]   MEDICAL ONCOLOGY - CONSULATION  Referral MD: Dr. Cordelia Poche  Reason for Referral: Hypercalcemia, stage IV non-Hodgkin's lymphoma, favoring nodal marginal zone lymphoma  HPI: Amanda [name] is a 78 year old female followed by medical oncology for stage IV non-Hodgkin's lymphoma, favoring nodal marginal zone lymphoma.  She was diagnosed in March 2020.  She underwent excisional lymph node biopsy by general surgery confirming the diagnosis.  Initial staging PET scan showed diffuse lymphadenopathy but no bulky disease, threatened endorgan function, or cytopenia secondary to her lymphoma.  The patient has not had any associated symptoms such as unexplained fever, weight loss, night sweats, or progressive lymphadenopathy.  She has been on observation.  The patient presented to the emergency room with decreased appetite and fatigue for the past 2 to 3 weeks.  She has also had recent recurrent pneumonia and completed 2 rounds of antibiotics.  She has been being seen by pulmonology for bronchiectasis.  According to the initial H&P, the patient's husband reported that she had a low-grade temperature the day prior to admission up to 100.2 along with chills and cough.  She is also been having shortness of breath over the past 2 to 3 weeks.  Chest x-ray did not show any pneumonia but did show the continued adenopathy in the mediastinum and the continued interstitial and parenchymal opacities in the bases which were mildly improved compared to 05/18/2018.  Opacities were favored represent lymphomatous involvement based on the PET CT from May 29, 2018.  Blood cultures and urine culture were ordered.  COVID-19 testing was negative.  She was empirically started on Rocephin.  She was noted to have acute kidney injury likely with a creatinine up to 1.85 (baseline is 1.0-1.2).  Due to dehydration from decreased oral intake.  She also had hypercalcemia on  admission with a calcium level of 13.0.  She was started on hydration.  PTH was checked and was elevated.  Creatinine shows slight improvement this morning down to 1.62 and calcium dropped to 11.0.  When seen today, the patient reports fatigue.  States that prior to admission really could not get out of bed very much.  She denies having any fevers or chills.  Denies night sweats.  Reports itching over her scalp and her chest.  Reports ongoing cervical adenopathy which she thinks is unchanged.  Has trouble remembering things.  The patient reports that she takes a multivitamin but denies taking any herbal supplements.  States that she was given medication for her stomach and is currently taking 2 medications. One of them she knows is Pepcid.  She is unsure what the other one is but states that she may be taking Tums.  She was unable to quantify for me how much of this she is taking. Medical Oncology was asked to see the patient for recommendations regarding her hypercalcemia.    Past Medical History:  Diagnosis Date  . Anemia   . Arthritis   . Constipation, chronic   . GERD (gastroesophageal reflux disease)    zantac  . Heart murmur   . History of blood transfusion Amanda [name]  . Hyperlipidemia   . Hypothyroidism   . Lymphoproliferative disorder ([place])   . Macular degeneration 2013   Both eyes   . Osteopenia   . Pneumonia   . PONV (postoperative nausea and vomiting)    needs little anesthesia  . Shingles   . Shortness of breath    on  exertion  . Spleen enlarged   . SUI (stress urinary incontinence, female)   . Wears glasses   :  Past Surgical History:  Procedure Laterality Date  . BREAST EXCISIONAL BIOPSY Left 1980  . CARPAL TUNNEL RELEASE  1999  . CATARACT EXTRACTION  2009, 2011   BOTH EYES  . [place]  . COLONOSCOPY      Dr Cristina Gong  . DILATION AND CURETTAGE OF UTERUS     X2  . HYSTEROSCOPY W/D&C  01/07/2012   Procedure: DILATATION AND CURETTAGE  /HYSTEROSCOPY;  Surgeon: Terrance Mass, MD;  Location: [facility];  Service: Gynecology;  Laterality: N/A;  intrauterine foley catheter for tamponode   . LYMPH NODE BIOPSY Left 05/26/2018   Procedure: LEFT AXILLARY LYMPH NODE BIOPSY;  Surgeon: Fanny Skates, MD;  Location: [facility];  Service: General;  Laterality: Left;  . TONSILLECTOMY    . TONSILLECTOMY AND ADENOIDECTOMY    . TUBAL LIGATION     BY LAPAROSCOPY  . WISDOM TOOTH EXTRACTION    :  Current Facility-Administered Medications  Medication Dose Route Frequency Provider Last Rate Last Dose  . 0.9 %  sodium chloride infusion   Intravenous Continuous Hosie Poisson, MD 100 mL/hr at 06/17/18 0500    . acetaminophen (TYLENOL) tablet 1,000 mg  1,000 mg Oral Q6H PRN Hosie Poisson, MD   1,000 mg at 06/17/18 0625  . buPROPion (WELLBUTRIN XL) 24 hr tablet 150 mg  150 mg Oral Daily Hosie Poisson, MD   150 mg at 06/17/18 0750  . cefTRIAXone (ROCEPHIN) 1 g in sodium chloride 0.9 % 100 mL IVPB  1 g Intravenous QHS Hosie Poisson, MD 200 mL/hr at 06/16/18 2308 1 g at 06/16/18 2308  . diphenhydrAMINE-zinc acetate (BENADRYL) 2-0.1 % cream   Topical BID PRN Mariel Aloe, MD      . enoxaparin (LOVENOX) injection 30 mg  30 mg Subcutaneous Daily Hosie Poisson, MD   30 mg at 06/16/18 1838  . feeding supplement (ENSURE ENLIVE) (ENSURE ENLIVE) liquid 237 mL  237 mL Oral BID BM Mariel Aloe, MD      . hydrALAZINE (APRESOLINE) injection 10 mg  10 mg Intravenous Q4H PRN Kirby-Graham, Karsten Fells, NP      . levothyroxine (SYNTHROID, LEVOTHROID) tablet 125 mcg  125 mcg Oral Q0600 Hosie Poisson, MD   125 mcg at 06/17/18 0750  . ondansetron (ZOFRAN) injection 4 mg  4 mg Intravenous Q6H PRN Hosie Poisson, MD      . pantoprazole (PROTONIX) EC tablet 40 mg  40 mg Oral Daily Hosie Poisson, MD   40 mg at 06/17/18 0750     Allergies  Allergen Reactions  . Phenergan [Promethazine Hcl] Other (See Comments)    Jerking/agitation  . Levofloxacin Nausea And Vomiting  .  Pravastatin Sodium Nausea And Vomiting  :  Family History  Problem Relation Age of Onset  . Ovarian cancer Mother   . Breast cancer Mother 110  . Hypertension Father   . Prostate cancer Father   . Kidney failure Father   . Diabetes Father   . Hyperlipidemia Brother   . COPD Paternal Grandfather   . Stroke Maternal Grandfather   :  Social History   Socioeconomic History  . Marital status: Married    Spouse name: Not on file  . Number of children: Not on file  . Years of education: Not on file  . Highest education level: Not on file  Occupational History  . Not on  file  Social Needs  . Financial resource strain: Not on file  . Food insecurity:    Worry: Not on file    Inability: Not on file  . Transportation needs:    Medical: Not on file    Non-medical: Not on file  Tobacco Use  . Smoking status: Never Smoker  . Smokeless tobacco: Never Used  Substance and Sexual Activity  . Alcohol use: Yes    Alcohol/week: 0.0 standard drinks    Comment: Red wine occasionally  . Drug use: No  . Sexual activity: Never    Birth control/protection: Post-menopausal  Lifestyle  . Physical activity:    Days per week: Not on file    Minutes per session: Not on file  . Stress: Not on file  Relationships  . Social connections:    Talks on phone: Not on file    Gets together: Not on file    Attends religious service: Not on file    Active member of club or organization: Not on file    Attends meetings of clubs or organizations: Not on file    Relationship status: Not on file  . Intimate partner violence:    Fear of current or ex partner: Not on file    Emotionally abused: Not on file    Physically abused: Not on file    Forced sexual activity: Not on file  Other Topics Concern  . Not on file  Social History Narrative  . Not on file   Review of Systems: Constitutional: Reports fatigue and generalized weakness.  Denies fevers, chills Eyes: Denies blurriness of vision Ears,  nose, mouth, throat, and face: Denies mucositis or sore throat Respiratory: Denies cough, dyspnea or wheezes Cardiovascular: Denies palpitation, chest discomfort Gastrointestinal:  Denies nausea, heartburn or change in bowel habits Skin: Reports itching. Lymphatics: Denies new lymphadenopathy or easy bruising Neurological:Denies numbness, tingling or new weaknesses Behavioral/Psych: Mood is stable, no new changes  Extremities: No lower extremity edema All other systems were reviewed with the patient and are negative.  Exam: Patient Vitals for the past 24 hrs:  BP Temp Temp src Pulse Resp SpO2 Weight  06/17/18 1213 (!) 157/76 98.1 F (36.7 C) Oral 68 18 98 % -  06/17/18 0535 (!) 169/70 98.6 F (37 C) Oral 72 17 97 % -  06/16/18 2052 (!) 161/68 - - 94 - 97 % -  06/16/18 1952 (!) 195/76 98.7 F (37.1 C) Oral 79 (!) 24 99 % -  06/16/18 1856 - - - - - - 143 lb 8.3 oz (65.1 kg)  06/16/18 1847 (!) 218/76 - - - - - -  06/16/18 1821 - 98.5 F (36.9 C) Oral 75 20 99 % -  06/16/18 1559 (!) 191/87 98.3 F (36.8 C) - 68 20 98 % -   GENERAL:alert, no distress and comfortable SKIN: skin color, texture, turgor are normal, no rashes or significant lesions EYES: normal, Conjunctiva are pink and non-injected, sclera clear OROPHARYNX:no exudate, no erythema and lips, buccal mucosa, and tongue normal  NECK: supple, thyroid normal size, non-tender, without nodularity LYMPH: Small shotty bilateral cervical adenopathy, approximately 1 cm. LUNGS: clear to auscultation and percussion with normal breathing effort HEART: regular rate & rhythm and no murmurs and no lower extremity edema ABDOMEN:abdomen soft, non-tender and normal bowel sounds Musculoskeletal:no cyanosis of digits and no clubbing  NEURO: alert & oriented x 3 with fluent speech, no focal motor/sensory deficits  Lab Results  Component Value Date   WBC 6.4  06/17/2018   HGB 10.5 (L) 06/17/2018   HCT 32.8 (L) 06/17/2018   PLT 200  06/17/2018   GLUCOSE 88 06/17/2018   CHOL 186 02/17/2017   TRIG 122.0 02/17/2017   HDL 60.40 02/17/2017   LDLDIRECT 142.8 11/22/2010   LDLCALC 101 (H) 02/17/2017   ALT 14 06/16/2018   AST 24 06/16/2018   NA 140 06/17/2018   K 3.6 06/17/2018   CL 109 06/17/2018   CREATININE 1.62 (H) 06/17/2018   BUN 21 06/17/2018   CO2 24 06/17/2018    Dg Chest 2 View  Result Date: 06/16/2018 CLINICAL DATA:  Three weeks of coughing. Recent diagnosis of lymphoma. EXAM: CHEST - 2 VIEW COMPARISON:  May 18, 2018 FINDINGS: Continued prominence of the mediastinum consistent with known adenopathy/lymphoma. The cardiomediastinal silhouette is stable. No pneumothorax. Interstitial and parenchymal opacity in the bases persists but is mildly improved since May 18, 2018. No other changes. IMPRESSION: 1. Continued adenopathy in the mediastinum. 2. Continued interstitial and parenchymal opacities in the bases, mildly improved since May 18, 2018. These opacities were favored to represent lymphomatous involvement based on a PET-CT from May 29, 2018. Electronically Signed   By: Dorise Bullion III M.D   On: 06/16/2018 13:09   Ct Chest W Contrast  Result Date: 05/19/2018 CLINICAL DATA:  78 year old female with cough and shortness of breath EXAM: CT CHEST WITH CONTRAST TECHNIQUE: Multidetector CT imaging of the chest was performed during intravenous contrast administration. CONTRAST:  30mL OMNIPAQUE IOHEXOL 300 MG/ML  SOLN COMPARISON:  Chest x-ray 05/18/2018, CT 11/20/2015, 11/23/2014 FINDINGS: Cardiovascular: Heart size within normal limits. Trace pericardial fluid/thickening. Minimal aortic valve calcifications. No significant coronary calcifications. Course caliber and contour of the thoracic aorta unremarkable with no dissection or aneurysm. Branch vessels are patent. Main pulmonary artery diameter within normal limits. No filling defects within the lobar, segmental, or proximal subsegmental vessels.  Mediastinum/Nodes: Extensive bulky lymphadenopathy through the mediastinum at all nodal stations, including bilateral hilar regions, new from the comparison studies. Bilateral bulky axillary adenopathy. Unremarkable appearance of the thoracic esophagus. Lungs/Pleura: Bilateral pleural effusions. Multiple bilateral small pulmonary nodules throughout all lobes. Bronchiectasis of the right middle lobe with partial volume loss/atelectasis, worsening from the comparison. Centrilobular ground-glass nodularity of the bilateral lung bases. Mild interlobular septal thickening. Upper Abdomen: The diameter of the spleen measures greater than 11 cm, which measures enlarged compared to the available imaging of the upper abdomen from prior CT studies. Musculoskeletal: No acute displaced fracture. Mild degenerative changes of the thoracic spine. IMPRESSION: Pathologic lymphadenopathy of the bilateral axillary regions and the mediastinum, compatible with lymphoma. Referral for oncologic evaluation recommended. Multiple small lung nodules, with a pattern of centrilobular nodularity at the lung bases, and mild interlobular septal thickening. The differential includes sequela of lymphoproliferative disorder, edema, and/or atypical infection. Small bilateral pleural effusions. Suggestion of splenomegaly, supporting lymphoma or other lymphoproliferative disorder as the primary diagnosis. Complete abdominal imaging may be useful. Electronically Signed   By: Corrie Mckusick D.O.   On: 05/19/2018 14:21   Nm Pet Image Initial (pi) Skull Base To Thigh  Result Date: 05/29/2018 CLINICAL DATA:  Initial treatment strategy for thoracic lymphadenopathy. EXAM: NUCLEAR MEDICINE PET SKULL BASE TO THIGH TECHNIQUE: 7.0 mCi F-18 FDG was injected intravenously. Full-ring PET imaging was performed from the skull base to thigh after the radiotracer. CT data was obtained and used for attenuation correction and anatomic localization. Fasting blood glucose:  114 mg/dl COMPARISON:  Chest CT 05/19/2018 FINDINGS: Mediastinal blood pool activity: SUV  max 2.4 NECK: Hypermetabolic cervical lymphadenopathy identified bilaterally. 9 mm short axis left index level III lymph node demonstrates SUV max = 5.9. Symmetric uptake identified in the oropharynx. Incidental CT findings: none CHEST: Hypermetabolic supraclavicular, subpectoral, axillary, mediastinal, and bilateral hilar lymphadenopathy evident. *12 mm short axis left subpectoral node (50/4) demonstrates SUV max = 11.2. *14 mm short axis right subpectoral node (55/4) demonstrates SUV max = 15.1. *2.1 cm short axis subcarinal node is hypermetabolic with SUV max = 41.6. Nodular airspace disease noted bilaterally with a basilar predominance. Areas of more confluent interstitial and airspace opacity noted in the right middle lobe with SUV max = 8.4. hypermetabolic disease in the inferior lingula demonstrates SUV max = 5.8. Incidental CT findings: Diffuse interstitial thickening noted in the lower lungs with areas of parenchymal nodularity. Tiny right pleural effusion evident. Bilateral mosaic attenuation is nonspecific but may reflect small airways disease. ABDOMEN/PELVIS: Spleen is diffusely hypermetabolic with SUV max = 5.9. No substantial splenomegaly with 12.5 cm craniocaudal length. Hypermetabolic lymphadenopathy is seen in the gastrohepatic ligament, hepatoduodenal ligament, and retroperitoneal space of the abdomen and along both pelvic sidewalls and both groin regions. *10 mm short axis aortocaval index node (110/4) demonstrates SUV max = 10. *10 mm short axis left para-aortic node (131/4) demonstrates SUV max = 8.5. *15 mm short axis index node right external iliac chain (166/4) has SUV max = 12.3. *6 mm left groin node (181/4) demonstrates SUV max = 5.1. Incidental CT findings: There is abdominal aortic atherosclerosis without aneurysm. SKELETON: Small hypermetabolic lesion identified inferior tip of left scapula without  underlying lytic or sclerotic abnormality on CT imaging. SUV max = 3.3. Small hypermetabolic focus in the roof of the left acetabulum demonstrates SUV max = 4. No underlying lesion evident by CT. Incidental CT findings: none IMPRESSION: 1. Hypermetabolic lymphadenopathy in the neck, chest, abdomen, and pelvis. Lymphoma is considered the most likely etiology although diffuse metastatic involvement can not be completely excluded. 2. Interstitial and parenchymal disease in both lungs with a basilar predominance and areas of more focal consolidative change in the right middle lobe, lingula, and posterior lower lobes. These areas of the lungs are hypermetabolic, compatible with tumor involvement. 3. Small hypermetabolic foci identified in the inferior left scapula and left acetabulum, suspicious for marrow involvement. 4.  Aortic Atherosclerois (ICD10-170.0) Electronically Signed   By: Misty Stanley M.D.   On: 05/29/2018 09:55   Dg Chest 2 View  Result Date: 06/16/2018 CLINICAL DATA:  Three weeks of coughing. Recent diagnosis of lymphoma. EXAM: CHEST - 2 VIEW COMPARISON:  May 18, 2018 FINDINGS: Continued prominence of the mediastinum consistent with known adenopathy/lymphoma. The cardiomediastinal silhouette is stable. No pneumothorax. Interstitial and parenchymal opacity in the bases persists but is mildly improved since May 18, 2018. No other changes. IMPRESSION: 1. Continued adenopathy in the mediastinum. 2. Continued interstitial and parenchymal opacities in the bases, mildly improved since May 18, 2018. These opacities were favored to represent lymphomatous involvement based on a PET-CT from May 29, 2018. Electronically Signed   By: Dorise Bullion III M.D   On: 06/16/2018 13:09   Ct Chest W Contrast  Result Date: 05/19/2018 CLINICAL DATA:  78 year old female with cough and shortness of breath EXAM: CT CHEST WITH CONTRAST TECHNIQUE: Multidetector CT imaging of the chest was performed during  intravenous contrast administration. CONTRAST:  12mL OMNIPAQUE IOHEXOL 300 MG/ML  SOLN COMPARISON:  Chest x-ray 05/18/2018, CT 11/20/2015, 11/23/2014 FINDINGS: Cardiovascular: Heart size within normal limits. Trace pericardial fluid/thickening. Minimal  aortic valve calcifications. No significant coronary calcifications. Course caliber and contour of the thoracic aorta unremarkable with no dissection or aneurysm. Branch vessels are patent. Main pulmonary artery diameter within normal limits. No filling defects within the lobar, segmental, or proximal subsegmental vessels. Mediastinum/Nodes: Extensive bulky lymphadenopathy through the mediastinum at all nodal stations, including bilateral hilar regions, new from the comparison studies. Bilateral bulky axillary adenopathy. Unremarkable appearance of the thoracic esophagus. Lungs/Pleura: Bilateral pleural effusions. Multiple bilateral small pulmonary nodules throughout all lobes. Bronchiectasis of the right middle lobe with partial volume loss/atelectasis, worsening from the comparison. Centrilobular ground-glass nodularity of the bilateral lung bases. Mild interlobular septal thickening. Upper Abdomen: The diameter of the spleen measures greater than 11 cm, which measures enlarged compared to the available imaging of the upper abdomen from prior CT studies. Musculoskeletal: No acute displaced fracture. Mild degenerative changes of the thoracic spine. IMPRESSION: Pathologic lymphadenopathy of the bilateral axillary regions and the mediastinum, compatible with lymphoma. Referral for oncologic evaluation recommended. Multiple small lung nodules, with a pattern of centrilobular nodularity at the lung bases, and mild interlobular septal thickening. The differential includes sequela of lymphoproliferative disorder, edema, and/or atypical infection. Small bilateral pleural effusions. Suggestion of splenomegaly, supporting lymphoma or other lymphoproliferative disorder as the  primary diagnosis. Complete abdominal imaging may be useful. Electronically Signed   By: Corrie Mckusick D.O.   On: 05/19/2018 14:21   Nm Pet Image Initial (pi) Skull Base To Thigh  Result Date: 05/29/2018 CLINICAL DATA:  Initial treatment strategy for thoracic lymphadenopathy. EXAM: NUCLEAR MEDICINE PET SKULL BASE TO THIGH TECHNIQUE: 7.0 mCi F-18 FDG was injected intravenously. Full-ring PET imaging was performed from the skull base to thigh after the radiotracer. CT data was obtained and used for attenuation correction and anatomic localization. Fasting blood glucose: 114 mg/dl COMPARISON:  Chest CT 05/19/2018 FINDINGS: Mediastinal blood pool activity: SUV max 2.4 NECK: Hypermetabolic cervical lymphadenopathy identified bilaterally. 9 mm short axis left index level III lymph node demonstrates SUV max = 5.9. Symmetric uptake identified in the oropharynx. Incidental CT findings: none CHEST: Hypermetabolic supraclavicular, subpectoral, axillary, mediastinal, and bilateral hilar lymphadenopathy evident. *12 mm short axis left subpectoral node (50/4) demonstrates SUV max = 11.2. *14 mm short axis right subpectoral node (55/4) demonstrates SUV max = 15.1. *2.1 cm short axis subcarinal node is hypermetabolic with SUV max = 25.8. Nodular airspace disease noted bilaterally with a basilar predominance. Areas of more confluent interstitial and airspace opacity noted in the right middle lobe with SUV max = 8.4. hypermetabolic disease in the inferior lingula demonstrates SUV max = 5.8. Incidental CT findings: Diffuse interstitial thickening noted in the lower lungs with areas of parenchymal nodularity. Tiny right pleural effusion evident. Bilateral mosaic attenuation is nonspecific but may reflect small airways disease. ABDOMEN/PELVIS: Spleen is diffusely hypermetabolic with SUV max = 5.9. No substantial splenomegaly with 12.5 cm craniocaudal length. Hypermetabolic lymphadenopathy is seen in the gastrohepatic ligament,  hepatoduodenal ligament, and retroperitoneal space of the abdomen and along both pelvic sidewalls and both groin regions. *10 mm short axis aortocaval index node (110/4) demonstrates SUV max = 10. *10 mm short axis left para-aortic node (131/4) demonstrates SUV max = 8.5. *15 mm short axis index node right external iliac chain (166/4) has SUV max = 12.3. *6 mm left groin node (181/4) demonstrates SUV max = 5.1. Incidental CT findings: There is abdominal aortic atherosclerosis without aneurysm. SKELETON: Small hypermetabolic lesion identified inferior tip of left scapula without underlying lytic or sclerotic abnormality on CT imaging.  SUV max = 3.3. Small hypermetabolic focus in the roof of the left acetabulum demonstrates SUV max = 4. No underlying lesion evident by CT. Incidental CT findings: none IMPRESSION: 1. Hypermetabolic lymphadenopathy in the neck, chest, abdomen, and pelvis. Lymphoma is considered the most likely etiology although diffuse metastatic involvement can not be completely excluded. 2. Interstitial and parenchymal disease in both lungs with a basilar predominance and areas of more focal consolidative change in the right middle lobe, lingula, and posterior lower lobes. These areas of the lungs are hypermetabolic, compatible with tumor involvement. 3. Small hypermetabolic foci identified in the inferior left scapula and left acetabulum, suspicious for marrow involvement. 4.  Aortic Atherosclerois (ICD10-170.0) Electronically Signed   By: Misty Stanley M.D.   On: 05/29/2018 09:55    Assessment and Plan:  1. Hypercalcemia -Etiology is unclear.  May be related to her acute kidney injury from dehydration. -The patient reports that she has possibly been taking Tums at home.  She was really unsure and could not quantify how much she is taking if she is even taking this medication. -Continue aggressive hydration. -I have spoken with the pharmacy who advises that Delton See should be given as an  outpatient.  We can set her up after discharge for consideration of Xgeva. -PTH elevated. Consider checking PTHrP and 1,25 di-OH Vit D levels.   2. Stage IV NHL, favoring nodal marginal zone lymphoma  -The patient has a low-grade marginal zone lymphoma. -Most recent PET scan showed diffuse lymphadenopathy but no bulky disease.  Other than fatigue, she does not have any associated symptoms such as unexplained fevers, weight loss, night sweats, or progressive lymphadenopathy. -Treatment is not indicated at this time.  She will be continue to monitor closely with labs and imaging.   3. Mild anemia -Likely dilutional.   -Hemoglobin prior to admission has been normal.  Continue to monitor.  Thank you for this referral.   Mikey Bussing, DNP, AGPCNP-BC, AOCNP

## 2018-06-18 ENCOUNTER — Encounter: Payer: Self-pay | Admitting: Hematology

## 2018-06-18 DIAGNOSIS — N183 Chronic kidney disease, stage 3 (moderate): Secondary | ICD-10-CM

## 2018-06-18 LAB — CALCITRIOL (1,25 DI-OH VIT D): Vit D, 1,25-Dihydroxy: 136 pg/mL — ABNORMAL HIGH (ref 19.9–79.3)

## 2018-06-18 LAB — CBC
HCT: 33.7 % — ABNORMAL LOW (ref 36.0–46.0)
Hemoglobin: 10.9 g/dL — ABNORMAL LOW (ref 12.0–15.0)
MCH: 26.8 pg (ref 26.0–34.0)
MCHC: 32.3 g/dL (ref 30.0–36.0)
MCV: 83 fL (ref 80.0–100.0)
Platelets: 191 10*3/uL (ref 150–400)
RBC: 4.06 MIL/uL (ref 3.87–5.11)
RDW: 14.7 % (ref 11.5–15.5)
WBC: 7 10*3/uL (ref 4.0–10.5)
nRBC: 0 % (ref 0.0–0.2)

## 2018-06-18 LAB — RENAL FUNCTION PANEL
Albumin: 3 g/dL — ABNORMAL LOW (ref 3.5–5.0)
Anion gap: 7 (ref 5–15)
BUN: 16 mg/dL (ref 8–23)
CO2: 23 mmol/L (ref 22–32)
Calcium: 10.4 mg/dL — ABNORMAL HIGH (ref 8.9–10.3)
Chloride: 110 mmol/L (ref 98–111)
Creatinine, Ser: 1.51 mg/dL — ABNORMAL HIGH (ref 0.44–1.00)
GFR calc Af Amer: 38 mL/min — ABNORMAL LOW (ref >60)
GFR calc non Af Amer: 33 mL/min — ABNORMAL LOW (ref >60)
Glucose, Bld: 86 mg/dL (ref 70–99)
Phosphorus: 2 mg/dL — ABNORMAL LOW (ref 2.5–4.6)
Potassium: 3.5 mmol/L (ref 3.5–5.1)
Sodium: 140 mmol/L (ref 135–145)

## 2018-06-18 LAB — MAGNESIUM: Magnesium: 1.6 mg/dL — ABNORMAL LOW (ref 1.7–2.4)

## 2018-06-18 LAB — BASIC METABOLIC PANEL
Anion gap: 9 (ref 5–15)
BUN: 14 mg/dL (ref 8–23)
CO2: 22 mmol/L (ref 22–32)
Calcium: 10.4 mg/dL — ABNORMAL HIGH (ref 8.9–10.3)
Chloride: 108 mmol/L (ref 98–111)
Creatinine, Ser: 1.5 mg/dL — ABNORMAL HIGH (ref 0.44–1.00)
GFR calc Af Amer: 38 mL/min — ABNORMAL LOW (ref >60)
GFR calc non Af Amer: 33 mL/min — ABNORMAL LOW (ref >60)
Glucose, Bld: 100 mg/dL — ABNORMAL HIGH (ref 70–99)
Potassium: 2.9 mmol/L — ABNORMAL LOW (ref 3.5–5.1)
Sodium: 139 mmol/L (ref 135–145)

## 2018-06-18 LAB — URINE CULTURE

## 2018-06-18 LAB — PHOSPHORUS: Phosphorus: 1.7 mg/dL — ABNORMAL LOW (ref 2.5–4.6)

## 2018-06-18 MED ORDER — POLYETHYLENE GLYCOL 3350 17 G PO PACK
17.0000 g | PACK | Freq: Two times a day (BID) | ORAL | Status: DC
  Administered 2018-06-18 – 2018-06-19 (×4): 17 g via ORAL
  Filled 2018-06-18 (×4): qty 1

## 2018-06-18 MED ORDER — BISACODYL 5 MG PO TBEC: 5.0000 mg | DELAYED_RELEASE_TABLET | Freq: Every day | ORAL | Status: DC | PRN

## 2018-06-18 MED ORDER — POTASSIUM CHLORIDE CRYS ER 20 MEQ PO TBCR
20.0000 meq | EXTENDED_RELEASE_TABLET | Freq: Once | ORAL | Status: AC
  Administered 2018-06-18: 20 meq via ORAL
  Filled 2018-06-18: qty 1

## 2018-06-18 MED ORDER — ZOLEDRONIC ACID 4 MG/5ML IV CONC: 4.0000 mg | Freq: Once | INTRAVENOUS | Status: DC

## 2018-06-18 MED ORDER — FUROSEMIDE 10 MG/ML IJ SOLN
40.0000 mg | Freq: Once | INTRAMUSCULAR | Status: AC
  Administered 2018-06-18: 22:00:00 40 mg via INTRAVENOUS
  Filled 2018-06-18: qty 4

## 2018-06-18 MED ORDER — K PHOS MONO-SOD PHOS DI & MONO 155-852-130 MG PO TABS
500.0000 mg | ORAL_TABLET | Freq: Three times a day (TID) | ORAL | Status: DC
  Filled 2018-06-18: qty 2

## 2018-06-18 NOTE — Progress Notes (Signed)
Occupational Therapy Treatment Patient Details Name: Amanda [name] MRN: [id number] DOB: 24-Apr-1940 Today's Date: 06/18/2018    History of present illness pt was admitted for AKI; she was fatiqued for 2-3 weeks prior to admission.  Has IV NHL and recurrent pna   OT comments  Reviewed energy conservation and gave pt a handout. She can borrow a shower seat at home. She verbalizes understanding of all. Encouraged frequent bursts of activity to regain endurance. Pt took a short walk in hallway with me and states that she would like to do this again later; PT will be by to see her.    Follow Up Recommendations  Supervision/Assistance - 24 hour    Equipment Recommendations  None recommended by OT    Recommendations for Other Services      Precautions / Restrictions Precautions Precautions: Fall Restrictions Weight Bearing Restrictions: No       Mobility Bed Mobility      supervision for back to bed            Transfers   Equipment used: Rolling walker (2 wheeled);None   Sit to Stand: Supervision         General transfer comment: cues for UE placement    Balance                                           ADL either performed or assessed with clinical judgement   ADL                           Toilet Transfer: Min guard;Ambulation;RW(bed)             General ADL Comments: reviewed energy conservation and gave pt handout. She has been sitting whenever she can and breaking activities up for several months.  She has found out that daughter has an unused shower chair, which she can borrow. She feels that she will be able to step into tub with grab bar without difficulty and did not feel she needed to practice this. Pt will perform ADL later when new clothes are dropped off to her.   Ambulated in hall with min guard.  Encouraged pt to ask nursing to walk with her, and PT is also coming by later to see her.  She felt a little better after she  got up and moved.      Vision       Perception     Praxis      Cognition Arousal/Alertness: Awake/alert Behavior During Therapy: WFL for tasks assessed/performed Overall Cognitive Status: Within Functional Limits for tasks assessed                                          Exercises     Shoulder Instructions       General Comments      Pertinent Vitals/ Pain       Pain Assessment: No/denies pain  Home Living                                          Prior Functioning/Environment  Frequency           Progress Toward Goals  OT Goals(current goals can now be found in the care plan section)  Progress towards OT goals: Progressing toward goals     Plan      Co-evaluation                 AM-PAC OT "6 Clicks" Daily Activity     Outcome Measure   Help from another person eating meals?: None Help from another person taking care of personal grooming?: A Little Help from another person toileting, which includes using toliet, bedpan, or urinal?: A Little Help from another person bathing (including washing, rinsing, drying)?: A Little Help from another person to put on and taking off regular upper body clothing?: A Little Help from another person to put on and taking off regular lower body clothing?: A Little 6 Click Score: 19    End of Session    OT Visit Diagnosis: Muscle weakness (generalized) (M62.81);Unsteadiness on feet (R26.81)   Activity Tolerance Patient tolerated treatment well   Patient Left in chair;with call bell/phone within reach;with chair alarm set   Nurse Communication          Time: 3235-5732 OT Time Calculation (min): 17 min  Charges: OT General Charges $OT Visit: 1 Visit OT Treatments $Therapeutic Activity: 8-22 mins  Lesle Chris, OTR/L Acute Rehabilitation Services [phone] [place] pager [phone] office 06/18/2018   [place] 06/18/2018, 11:37 AM

## 2018-06-18 NOTE — Progress Notes (Signed)
Physical Therapy Treatment Patient Details Name: Amanda [name] MRN: [id number] DOB: 1940-03-25 Today's Date: 06/18/2018    History of Present Illness pt was admitted for AKI; she was fatiqued for 2-3 weeks prior to admission.  Has IV NHL and recurrent pna    PT Comments    Assisted OOB to amb in hallway using Rollator 4ww with seat.  General transfer comment: 50% VC's on proper use/breaks of Rollator walker plus safety with turns General Gait Details: 25% VC's on proper use of Rollator walker, brakes and to alway park walker against a wall prior to sit.  Tolerated distance well.  Follow Up Recommendations  Home health PT     Equipment Recommendations  Other (comment)(4 ww Rollator)    Recommendations for Other Services       Precautions / Restrictions      Mobility  Bed Mobility Overal bed mobility: Modified Independent             General bed mobility comments: self able increased time  Transfers Overall transfer level: Needs assistance Equipment used: 4-wheeled walker Transfers: Sit to/from Stand;Stand Pivot Transfers Sit to Stand: Supervision;Min guard Stand pivot transfers: Supervision;Min guard       General transfer comment: 50% VC's on proper use/breaks of Rollator walker plus safety with turns   Ambulation/Gait Ambulation/Gait assistance: Supervision;Min guard Gait Distance (Feet): 275 Feet(3 seated rest breaks) Assistive device: 4-wheeled walker Gait Pattern/deviations: Step-through pattern;Decreased stride length Gait velocity: decreased    General Gait Details: 25% VC's on proper use of Rollator walker, brakes and to alway park walker against a wall prior to sit.  Tolerated distance well.   Stairs             Wheelchair Mobility    Modified Rankin (Stroke Patients Only)       Balance                                            Cognition Arousal/Alertness: Awake/alert Behavior During Therapy: WFL for tasks  assessed/performed Overall Cognitive Status: Within Functional Limits for tasks assessed                                 General Comments: slight ST memory deficits, pt aware her mind is a little off      Exercises      General Comments        Pertinent Vitals/Pain Pain Assessment: No/denies pain    Home Living                      Prior Function            PT Goals (current goals can now be found in the care plan section) Progress towards PT goals: Progressing toward goals    Frequency    Min 3X/week      PT Plan Current plan remains appropriate    Co-evaluation              AM-PAC PT "6 Clicks" Mobility   Outcome Measure  Help needed turning from your back to your side while in a flat bed without using bedrails?: A Little Help needed moving from lying on your back to sitting on the side of a flat bed without using bedrails?: A Little Help needed moving to and  from a bed to a chair (including a wheelchair)?: A Little Help needed standing up from a chair using your arms (e.g., wheelchair or bedside chair)?: A Little Help needed to walk in hospital room?: A Little Help needed climbing 3-5 steps with a railing? : A Little 6 Click Score: 18    End of Session Equipment Utilized During Treatment: Gait belt Activity Tolerance: Patient tolerated treatment well Patient left: in bed;with bed alarm set;with call bell/phone within reach Nurse Communication: Mobility status PT Visit Diagnosis: Unsteadiness on feet (R26.81)     Time: 1410-1435 PT Time Calculation (min) (ACUTE ONLY): 25 min  Charges:  $Gait Training: 8-22 mins $Therapeutic Activity: 8-22 mins                     Rica Koyanagi  PTA Acute  Rehabilitation Services Pager      [phone] Office      [phone]

## 2018-06-18 NOTE — Progress Notes (Signed)
PROGRESS NOTE    Amanda [name]  YIF:[id number] DOB: 22-Nov-1940 DOA: 06/16/2018 PCP: [name], DO   Brief Narrative: Amanda [name] is a 78 y.o. female with medical history significant of with lymphoma, hypertension. Hypothyroidism, splenomegaly, GERD, stage 4 NHL, low grade nodal marginal zone lymphoma. Patient presented secondary to fatigue, loss of appetite, nausea/vomiting. Found to have calcium.   Assessment & Plan:   Active Problems:   Acute kidney injury (HCC)   Hypercalcemia   Acute kidney injury on CKD 3 Baseline creatinine of 1.1-1.2. Creatinine fo 1.85 on admission. Down to 1.51 today. Patient with poor appetite and oral intake. -IV fluids as mentioned below  Low grade temp Unsure of etiology. Possibly related to lymphoma. Tested for SARS-Cov-19 which was negative. Normal WBC.  Stage 4 non-Hodgkin lymphoma Follows with medical oncology as an outpatient  Hypercalcemia Possibly from AKI vs CKD but also in the setting of lymphoma.  PTH is low. Improved only minimally with initial IV fluids. -Increase to NS @150  ml/hr to improve urine output -Recheck BMP this afternoon -Recommend discontinuing calcium contain medications/supplements -prPTH, 1,25 vitamin d pending -Will add-on 25 vitamin d  Hypophosphatemia Unknown etiology at this time. Patient recently taking antacids which may have contributed.  GERD -Continue Protonix  Hypothyroidism -Continue Synthroid 125 mcg  Hypertensive urgency Unknown etiology. Not on medications as an outpatient. Receiving hydralazine IV prn inpatient -Continue amlodipine -Continue Hydralazine prn  Constipation   DVT prophylaxis: Lovenox Code Status:   Code Status: DNR Family Communication: None at bedside. Called husband on 4/15 Disposition Plan: Discharge pending improvement of hypercalcemia and AKI   Consultants:   Medical oncology  Procedures:   None  Antimicrobials:  None    Subjective: Still  with some weakness. No bowel movement in the past few days. Usually takes dulcolax.  Objective: Vitals:   06/17/18 0535 06/17/18 1213 06/17/18 2053 06/18/18 0605  BP: (!) 169/70 (!) 157/76 (!) 186/76 (!) 174/58  Pulse: 72 68 74 66  Resp: 17 18 18 16   Temp: 98.6 F (37 C) 98.1 F (36.7 C) 98 F (36.7 C) 98.5 F (36.9 C)  TempSrc: Oral Oral Oral Oral  SpO2: 97% 98% 97% 94%  Weight:      Height:        Intake/Output Summary (Last 24 hours) at 06/18/2018 1107 Last data filed at 06/18/2018 1019 Gross per 24 hour  Intake 120 ml  Output 2350 ml  Net -2230 ml   Filed Weights   06/16/18 1227 06/16/18 1856  Weight: 61.7 kg 65.1 kg    Examination:  General exam: Appears calm and comfortable Respiratory system: Clear to auscultation. Respiratory effort normal. Cardiovascular system: S1 & S2 heard, RRR. No murmurs, rubs, gallops or clicks. Gastrointestinal system: Abdomen is nondistended, soft and nontender. No organomegaly or masses felt. Normal bowel sounds heard. Central nervous system: Alert and oriented. No focal neurological deficits. Extremities: No edema. No calf tenderness Skin: No cyanosis. No rashes Psychiatry: Judgement and insight appear normal. Flat affect     Data Reviewed: I have personally reviewed following labs and imaging studies  CBC: Recent Labs  Lab 06/16/18 1313 06/17/18 0525  WBC 8.2 6.4  NEUTROABS 5.4  --   HGB 12.6 10.5*  HCT 39.6 32.8*  MCV 81.6 83.2  PLT 245 867   Basic Metabolic Panel: Recent Labs  Lab 06/16/18 1313 06/16/18 1639 06/17/18 0525 06/17/18 1706 06/18/18 0444  NA 137  --  140 140 140  K 3.4*  --  3.6 3.4* 3.5  CL 100  --  109 108 110  CO2 27  --  24 24 23   GLUCOSE 119*  --  88 96 86  BUN 27*  --  21 20 16   CREATININE 1.85*  --  1.62* 1.57* 1.51*  CALCIUM 13.0* 12.3* 11.0* 11.3* 10.4*  MG  --   --   --  1.6*  --   PHOS  --   --   --   --  2.0*   GFR: Estimated Creatinine Clearance: 26.4 mL/min (A) (by C-G formula  based on SCr of 1.51 mg/dL (H)). Liver Function Tests: Recent Labs  Lab 06/16/18 1313 06/18/18 0444  AST 24  --   ALT 14  --   ALKPHOS 61  --   BILITOT 0.4  --   PROT 6.9  --   ALBUMIN 4.0 3.0*   No results for input(s): LIPASE, AMYLASE in the last 168 hours. No results for input(s): AMMONIA in the last 168 hours. Coagulation Profile: No results for input(s): INR, PROTIME in the last 168 hours. Cardiac Enzymes: Recent Labs  Lab 06/16/18 1313  TROPONINI <0.03   BNP (last 3 results) No results for input(s): PROBNP in the last 8760 hours. HbA1C: No results for input(s): HGBA1C in the last 72 hours. CBG: Recent Labs  Lab 06/16/18 1248  GLUCAP 107*   Lipid Profile: No results for input(s): CHOL, HDL, LDLCALC, TRIG, CHOLHDL, LDLDIRECT in the last 72 hours. Thyroid Function Tests: Recent Labs    06/16/18 1639  TSH 0.582   Anemia Panel: No results for input(s): VITAMINB12, FOLATE, FERRITIN, TIBC, IRON, RETICCTPCT in the last 72 hours. Sepsis Labs: No results for input(s): PROCALCITON, LATICACIDVEN in the last 168 hours.  Recent Results (from the past 240 hour(s))  SARS Coronavirus 2 [facility] order, Performed in [place] hospital lab)     Status: None   Collection Time: 06/16/18  5:27 PM  Result Value Ref Range Status   SARS Coronavirus 2 NEGATIVE NEGATIVE Final    Comment: (NOTE) If result is NEGATIVE SARS-CoV-2 target nucleic acids are NOT DETECTED. The SARS-CoV-2 RNA is generally detectable in upper and lower  respiratory specimens during the acute phase of infection. The lowest  concentration of SARS-CoV-2 viral copies this assay can detect is 250  copies / mL. A negative result does not preclude SARS-CoV-2 infection  and should not be used as the sole basis for treatment or other  patient management decisions.  A negative result may occur with  improper specimen collection / handling, submission of specimen other  than nasopharyngeal swab, presence of  viral mutation(s) within the  areas targeted by this assay, and inadequate number of viral copies  (<250 copies / mL). A negative result must be combined with clinical  observations, patient history, and epidemiological information. If result is POSITIVE SARS-CoV-2 target nucleic acids are DETECTED. The SARS-CoV-2 RNA is generally detectable in upper and lower  respiratory specimens dur ing the acute phase of infection.  Positive  results are indicative of active infection with SARS-CoV-2.  Clinical  correlation with patient history and other diagnostic information is  necessary to determine patient infection status.  Positive results do  not rule out bacterial infection or co-infection with other viruses. If result is PRESUMPTIVE POSTIVE SARS-CoV-2 nucleic acids MAY BE PRESENT.   A presumptive positive result was obtained on the submitted specimen  and confirmed on repeat testing.  While 2019 novel coronavirus  (SARS-CoV-2) nucleic acids may be  present in the submitted sample  additional confirmatory testing may be necessary for epidemiological  and / or clinical management purposes  to differentiate between  SARS-CoV-2 and other Sarbecovirus currently known to infect humans.  If clinically indicated additional testing with an alternate test  methodology [phone]) is advised. The SARS-CoV-2 RNA is generally  detectable in upper and lower respiratory sp ecimens during the acute  phase of infection. The expected result is Negative. Fact Sheet for Patients:  StrictlyIdeas.no Fact Sheet for Healthcare Providers: BankingDealers.co.za This test is not yet approved or cleared by the [place] FDA and has been authorized for detection and/or diagnosis of SARS-CoV-2 by FDA under an Emergency Use Authorization (EUA).  This EUA will remain in effect (meaning this test can be used) for the duration of the COVID-19 declaration under Section  564(b)(1) of the Act, 21 U.S.C. section 360bbb-3(b)(1), unless the authorization is terminated or revoked sooner. Performed at [facility], [place] [address] [place], [place] [address]   Culture, Urine     Status: Abnormal   Collection Time: 06/16/18 10:55 PM  Result Value Ref Range Status   Specimen Description   Final    URINE, CLEAN CATCH Performed at [facility], [place] [address], [place], [place] [address]    Special Requests   Final    NONE Performed at [facility], [place] [address] [place], [place] [address]    Culture MULTIPLE SPECIES PRESENT, SUGGEST RECOLLECTION (A)  Final   Report Status 06/18/2018 FINAL  Final         Radiology Studies: Dg Chest 2 View  Result Date: 06/16/2018 CLINICAL DATA:  Three weeks of coughing. Recent diagnosis of lymphoma. EXAM: CHEST - 2 VIEW COMPARISON:  May 18, 2018 FINDINGS: Continued prominence of the mediastinum consistent with known adenopathy/lymphoma. The cardiomediastinal silhouette is stable. No pneumothorax. Interstitial and parenchymal opacity in the bases persists but is mildly improved since May 18, 2018. No other changes. IMPRESSION: 1. Continued adenopathy in the mediastinum. 2. Continued interstitial and parenchymal opacities in the bases, mildly improved since May 18, 2018. These opacities were favored to represent lymphomatous involvement based on a PET-CT from May 29, 2018. Electronically Signed   By: Dorise Bullion III M.D   On: 06/16/2018 13:09        Scheduled Meds: . amLODipine  5 mg Oral Q breakfast  . buPROPion  150 mg Oral Daily  . enoxaparin (LOVENOX) injection  30 mg Subcutaneous Daily  . feeding supplement (ENSURE ENLIVE)  237 mL Oral BID BM  . levothyroxine  125 mcg Oral Q0600  . pantoprazole  40 mg Oral Daily  . polyethylene glycol  17 g Oral BID   Continuous Infusions: . sodium chloride 125 mL/hr at 06/18/18 1012  . cefTRIAXone (ROCEPHIN)  IV 1 g  (06/17/18 2251)     LOS: 1 day     Cordelia Poche, MD [facility] 06/18/2018, 11:07 AM  If 7PM-7AM, please contact night-coverage [email or web address]

## 2018-06-18 NOTE — TOC Initial Note (Signed)
Transition of Care [facility]) - Initial/Assessment Note    Patient Details  Name: Amanda [name] MRN: [id number] Date of Birth: 22-May-1940  Transition of [facility]) CM/SW Contact:    Purcell Mouton, RN Phone Number: 06/18/2018, 2:30 PM  Clinical Narrative:        Pt admitted with decreased appetite, fatigue since 2 to 3 weeks.               Expected Discharge Plan: [place] Barriers to Discharge: No Barriers Identified   Patient Goals and CMS Choice Patient states their goals for this hospitalization and ongoing recovery are:: To get better!   Choice offered to / list presented to : Patient  Expected Discharge Plan and Services Expected Discharge Plan: [place]   Discharge Planning Services: CM Consult   Living arrangements for the past 2 months: Single Family Home                 DME Arranged: Walker rolling with seat DME Agency: [employer] HH Arranged: RN, PT [place] Agency: [place] ([facility])  Prior Living Arrangements/Services Living arrangements for the past 2 months: Single Family Home Lives with:: Spouse   Do you feel safe going back to the place where you live?: Yes          Current home services: Home PT, Home RN    Activities of Daily Living Home Assistive Devices/Equipment: Eyeglasses ADL Screening (condition at time of admission) Patient's cognitive ability adequate to safely complete daily activities?: Yes Is the patient deaf or have difficulty hearing?: No Does the patient have difficulty seeing, even when wearing glasses/contacts?: No Does the patient have difficulty concentrating, remembering, or making decisions?: No Patient able to express need for assistance with ADLs?: Yes Does the patient have difficulty dressing or bathing?: No Independently performs ADLs?: Yes (appropriate for developmental age) Does the patient have difficulty walking or climbing stairs?: Yes Weakness of Legs:  None Weakness of Arms/Hands: None  Permission Sought/Granted Permission sought to share information with : Case Manager                Emotional Assessment     Affect (typically observed): Accepting Orientation: : Oriented to Self, Oriented to Place, Oriented to  Time, Oriented to Situation      Admission diagnosis:  Hypercalcemia [E83.52] Acute kidney injury [facility]) [N17.9] Patient Active Problem List   Diagnosis Date Noted  . Hypercalcemia   . Weakness 06/16/2018  . Acute kidney injury ([place]) 06/16/2018  . Bronchiectasis without complication ([place]) 44/03/270  . DOE (dyspnea on exertion) 06/05/2018  . 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[place]) 05/21/2018  . Bronchospasm 04/24/2018  . Fatigue 04/24/2018  . Facial numbness 02/17/2017  . Abnormal CT of the abdomen 10/27/2015  . Elevated serum creatinine 10/27/2015  . 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[employer]) 12/05/2014  . Acute respiratory failure with hypoxia ([place]) 11/20/2014  . HLD (hyperlipidemia) 11/17/2014  . GERD (gastroesophageal reflux disease) 11/17/2014  . Cervical lymphadenitis 12/06/2013  . Family history of ovarian cancer 05/31/2013  . CAP (community acquired pneumonia) 07/02/2012  . Chest pain 07/02/2012  . Abnormal CT scan, head 07/02/2012  . Postmenopausal 03/10/2012  . Family history of breast cancer 12/12/2011  . IBS (irritable bowel syndrome) 12/12/2011  . Abdominal bloating 12/12/2011  . Chronic constipation 12/12/2011  . CARPAL TUNNEL SYNDROME, LEFT 04/21/2009  . GAIT DISTURBANCE 04/21/2009  . HYPERLIPIDEMIA 01/12/2009  . CERVICALGIA 09/12/2008  . Hypothyroidism 08/06/2006  .  OSTEOPENIA 08/06/2006  . URINARY INCONTINENCE 08/06/2006  . SKIN CANCER, HX OF 08/06/2006   PCP:  [name], DO Pharmacy:   [facility] [phone] [address], [address] AT [facility] [place] [address] Phone: [phone] Fax:  [phone]     Social Determinants of Health ([place]) Interventions    Readmission Risk Interventions No flowsheet data found.

## 2018-06-19 ENCOUNTER — Other Ambulatory Visit: Payer: Medicare Other

## 2018-06-19 ENCOUNTER — Encounter: Payer: Self-pay | Admitting: *Deleted

## 2018-06-19 ENCOUNTER — Ambulatory Visit: Payer: Medicare Other | Admitting: Hematology

## 2018-06-19 ENCOUNTER — Other Ambulatory Visit: Payer: Self-pay | Admitting: Hematology

## 2018-06-19 LAB — RENAL FUNCTION PANEL
Albumin: 3.1 g/dL — ABNORMAL LOW (ref 3.5–5.0)
Anion gap: 9 (ref 5–15)
BUN: 12 mg/dL (ref 8–23)
CO2: 23 mmol/L (ref 22–32)
Calcium: 10 mg/dL (ref 8.9–10.3)
Chloride: 108 mmol/L (ref 98–111)
Creatinine, Ser: 1.18 mg/dL — ABNORMAL HIGH (ref 0.44–1.00)
GFR calc Af Amer: 51 mL/min — ABNORMAL LOW (ref >60)
GFR calc non Af Amer: 44 mL/min — ABNORMAL LOW (ref >60)
Glucose, Bld: 94 mg/dL (ref 70–99)
Phosphorus: 2 mg/dL — ABNORMAL LOW (ref 2.5–4.6)
Potassium: 2.7 mmol/L — CL (ref 3.5–5.1)
Sodium: 140 mmol/L (ref 135–145)

## 2018-06-19 LAB — VITAMIN D 25 HYDROXY (VIT D DEFICIENCY, FRACTURES): Vit D, 25-Hydroxy: 20 ng/mL — ABNORMAL LOW (ref 30.0–100.0)

## 2018-06-19 MED ORDER — MAGNESIUM GLUCONATE 500 MG PO TABS
500.0000 mg | ORAL_TABLET | Freq: Every day | ORAL | Status: DC
  Administered 2018-06-19 – 2018-06-20 (×2): 500 mg via ORAL
  Filled 2018-06-19 (×2): qty 1

## 2018-06-19 MED ORDER — ENOXAPARIN SODIUM 40 MG/0.4ML ~~LOC~~ SOLN
40.0000 mg | Freq: Every day | SUBCUTANEOUS | Status: DC
  Administered 2018-06-19: 40 mg via SUBCUTANEOUS
  Filled 2018-06-19: qty 0.4

## 2018-06-19 MED ORDER — MAGNESIUM SULFATE 2 GM/50ML IV SOLN: 2.0000 g | Freq: Once | INTRAVENOUS | Status: DC

## 2018-06-19 MED ORDER — K PHOS MONO-SOD PHOS DI & MONO 155-852-130 MG PO TABS
500.0000 mg | ORAL_TABLET | Freq: Three times a day (TID) | ORAL | Status: DC
  Administered 2018-06-19 – 2018-06-20 (×3): 500 mg via ORAL
  Filled 2018-06-19 (×3): qty 2

## 2018-06-19 MED ORDER — POTASSIUM CHLORIDE CRYS ER 20 MEQ PO TBCR
40.0000 meq | EXTENDED_RELEASE_TABLET | ORAL | Status: AC
  Administered 2018-06-19 (×2): 40 meq via ORAL
  Filled 2018-06-19: qty 4
  Filled 2018-06-19: qty 2

## 2018-06-19 MED ORDER — MAGNESIUM OXIDE 400 (241.3 MG) MG PO TABS
400.0000 mg | ORAL_TABLET | Freq: Two times a day (BID) | ORAL | Status: DC
  Filled 2018-06-19: qty 1

## 2018-06-19 NOTE — Progress Notes (Signed)
Pt agreed with [facility]. Referral was given to in house rep.

## 2018-06-19 NOTE — Progress Notes (Addendum)
PROGRESS NOTE    Amanda [name]  JYN:[id number] DOB: [birth year] DOA: 06/16/2018 PCP: [name], DO   Brief Narrative: Amanda [name] is a 78 y.o. female with medical history significant of with lymphoma, hypertension. Hypothyroidism, splenomegaly, GERD, stage 4 NHL, low grade nodal marginal zone lymphoma. Patient presented secondary to fatigue, loss of appetite, nausea/vomiting. Found to have calcium.   Assessment & Plan:   Active Problems:   Acute kidney injury (HCC)   Hypercalcemia   Acute kidney injury on CKD 3 Baseline creatinine of 1.1-1.2. Creatinine fo 1.85 on admission. Resolved.  Low grade temp Unsure of etiology. Tested for SARS-Cov-19 which was negative. Normal WBC. No recurrence.  Stage 4 non-Hodgkin lymphoma Follows with medical oncology as an outpatient  Hypercalcemia Possibly from AKI vs CKD but also in the setting of lymphoma.  PTH is low. Improved only minimally with initial IV fluids. Elevated 1,25 vitamin d with low 25 vitamin d.  -Discontinue IV fluids today -Recheck BMP tomorrow morning with phosphorus -Recommend discontinuing calcium contain medications/supplements -PTHrP pending   Hypophosphatemia Replete  Hypokalemia Significantly low. Replete today. Recheck. May need to discharge on potassium supplementation. Recheck BMP this evening.  GERD -Continue Protonix  Hypothyroidism -Continue Synthroid 125 mcg  Hypertensive urgency Unknown etiology. Not on medications as an outpatient. Receiving hydralazine IV prn inpatient -Continue amlodipine -Continue Hydralazine prn  Constipation -Miralax  DVT prophylaxis: Lovenox Code Status:   Code Status: DNR Family Communication: Husband on telephone Disposition Plan: Discharge tomorrow if potassium improved.   Consultants:   Medical oncology  Procedures:   None  Antimicrobials:  None    Subjective: Still with weakness. No other issues today.  Objective: Vitals:   06/18/18 2036 06/19/18 0050 06/19/18 0548 06/19/18 0549  BP: (!) 171/84 (!) 164/77 (!) 148/60 (!) 148/60  Pulse: 74 77 70 70  Resp: 18  (!) 22 (!) 22  Temp: 98.4 F (36.9 C)  98.1 F (36.7 C) 98.1 F (36.7 C)  TempSrc: Oral  Oral Oral  SpO2: 98%  94% 94%  Weight:      Height:        Intake/Output Summary (Last 24 hours) at 06/19/2018 1653 Last data filed at 06/19/2018 0730 Gross per 24 hour  Intake 3273.89 ml  Output 2300 ml  Net 973.89 ml   Filed Weights   06/16/18 1227 06/16/18 1856  Weight: 61.7 kg 65.1 kg    Examination:  General exam: Appears calm and comfortable Respiratory system: Clear to auscultation. Respiratory effort normal. Cardiovascular system: S1 & S2 heard, RRR. No murmurs, rubs, gallops or clicks. Gastrointestinal system: Abdomen is nondistended, soft and nontender. No organomegaly or masses felt. Normal bowel sounds heard. Central nervous system: Alert and oriented. No focal neurological deficits. Extremities: No edema. No calf tenderness Skin: No cyanosis. No rashes Psychiatry: Judgement and insight appear normal. Mood & affect appropriate.     Data Reviewed: I have personally reviewed following labs and imaging studies  CBC: Recent Labs  Lab 06/16/18 1313 06/17/18 0525 06/18/18 1458  WBC 8.2 6.4 7.0  NEUTROABS 5.4  --   --   HGB 12.6 10.5* 10.9*  HCT 39.6 32.8* 33.7*  MCV 81.6 83.2 83.0  PLT 245 200 865   Basic Metabolic Panel: Recent Labs  Lab 06/17/18 0525 06/17/18 1706 06/18/18 0444 06/18/18 1458 06/19/18 0450  NA 140 140 140 139 140  K 3.6 3.4* 3.5 2.9* 2.7*  CL 109 108 110 108 108  CO2 24 24 23  22  23  GLUCOSE 88 96 86 100* 94  BUN 21 20 16 14 12   CREATININE 1.62* 1.57* 1.51* 1.50* 1.18*  CALCIUM 11.0* 11.3* 10.4* 10.4* 10.0  MG  --  1.6*  --  1.6*  --   PHOS  --   --  2.0* 1.7* 2.0*   GFR: Estimated Creatinine Clearance: 33.8 mL/min (A) (by C-G formula based on SCr of 1.18 mg/dL (H)). Liver Function Tests: Recent Labs   Lab 06/16/18 1313 06/18/18 0444 06/19/18 0450  AST 24  --   --   ALT 14  --   --   ALKPHOS 61  --   --   BILITOT 0.4  --   --   PROT 6.9  --   --   ALBUMIN 4.0 3.0* 3.1*   No results for input(s): LIPASE, AMYLASE in the last 168 hours. No results for input(s): AMMONIA in the last 168 hours. Coagulation Profile: No results for input(s): INR, PROTIME in the last 168 hours. Cardiac Enzymes: Recent Labs  Lab 06/16/18 1313  TROPONINI <0.03   BNP (last 3 results) No results for input(s): PROBNP in the last 8760 hours. HbA1C: No results for input(s): HGBA1C in the last 72 hours. CBG: Recent Labs  Lab 06/16/18 1248  GLUCAP 107*   Lipid Profile: No results for input(s): CHOL, HDL, LDLCALC, TRIG, CHOLHDL, LDLDIRECT in the last 72 hours. Thyroid Function Tests: No results for input(s): TSH, T4TOTAL, FREET4, T3FREE, THYROIDAB in the last 72 hours. Anemia Panel: No results for input(s): VITAMINB12, FOLATE, FERRITIN, TIBC, IRON, RETICCTPCT in the last 72 hours. Sepsis Labs: No results for input(s): PROCALCITON, LATICACIDVEN in the last 168 hours.  Recent Results (from the past 240 hour(s))  SARS Coronavirus 2 [facility] order, Performed in [place] hospital lab)     Status: None   Collection Time: 06/16/18  5:27 PM  Result Value Ref Range Status   SARS Coronavirus 2 NEGATIVE NEGATIVE Final    Comment: (NOTE) If result is NEGATIVE SARS-CoV-2 target nucleic acids are NOT DETECTED. The SARS-CoV-2 RNA is generally detectable in upper and lower  respiratory specimens during the acute phase of infection. The lowest  concentration of SARS-CoV-2 viral copies this assay can detect is 250  copies / mL. A negative result does not preclude SARS-CoV-2 infection  and should not be used as the sole basis for treatment or other  patient management decisions.  A negative result may occur with  improper specimen collection / handling, submission of specimen other  than nasopharyngeal swab,  presence of viral mutation(s) within the  areas targeted by this assay, and inadequate number of viral copies  (<250 copies / mL). A negative result must be combined with clinical  observations, patient history, and epidemiological information. If result is POSITIVE SARS-CoV-2 target nucleic acids are DETECTED. The SARS-CoV-2 RNA is generally detectable in upper and lower  respiratory specimens dur ing the acute phase of infection.  Positive  results are indicative of active infection with SARS-CoV-2.  Clinical  correlation with patient history and other diagnostic information is  necessary to determine patient infection status.  Positive results do  not rule out bacterial infection or co-infection with other viruses. If result is PRESUMPTIVE POSTIVE SARS-CoV-2 nucleic acids MAY BE PRESENT.   A presumptive positive result was obtained on the submitted specimen  and confirmed on repeat testing.  While 2019 novel coronavirus  (SARS-CoV-2) nucleic acids may be present in the submitted sample  additional confirmatory testing may be necessary  for epidemiological  and / or clinical management purposes  to differentiate between  SARS-CoV-2 and other Sarbecovirus currently known to infect humans.  If clinically indicated additional testing with an alternate test  methodology [phone]) is advised. The SARS-CoV-2 RNA is generally  detectable in upper and lower respiratory sp ecimens during the acute  phase of infection. The expected result is Negative. Fact Sheet for Patients:  StrictlyIdeas.no Fact Sheet for Healthcare Providers: BankingDealers.co.za This test is not yet approved or cleared by the [place] FDA and has been authorized for detection and/or diagnosis of SARS-CoV-2 by FDA under an Emergency Use Authorization (EUA).  This EUA will remain in effect (meaning this test can be used) for the duration of the COVID-19 declaration under  Section 564(b)(1) of the Act, 21 U.S.C. section 360bbb-3(b)(1), unless the authorization is terminated or revoked sooner. Performed at [facility], [place] [address], [place], [place] [address]   Culture, Urine     Status: Abnormal   Collection Time: 06/16/18 10:55 PM  Result Value Ref Range Status   Specimen Description   Final    URINE, CLEAN CATCH Performed at [facility], [place] [address], [place], [place] [address]    Special Requests   Final    NONE Performed at [facility], [place] [address], [place],  [address]    Culture MULTIPLE SPECIES PRESENT, SUGGEST RECOLLECTION (A)  Final   Report Status 06/18/2018 FINAL  Final         Radiology Studies: No results found.      Scheduled Meds: . amLODipine  5 mg Oral Q breakfast  . buPROPion  150 mg Oral Daily  . enoxaparin (LOVENOX) injection  40 mg Subcutaneous Daily  . feeding supplement (ENSURE ENLIVE)  237 mL Oral BID BM  . levothyroxine  125 mcg Oral Q0600  . magnesium gluconate  500 mg Oral Daily  . pantoprazole  40 mg Oral Daily  . phosphorus  500 mg Oral TID  . polyethylene glycol  17 g Oral BID   Continuous Infusions:    LOS: 2 days     Cordelia Poche, MD [facility] 06/19/2018, 4:53 PM  If 7PM-7AM, please contact night-coverage [email or web address]

## 2018-06-19 NOTE — Progress Notes (Signed)
HEMATOLOGY-ONCOLOGY PROGRESS NOTE  SUBJECTIVE: Still fatigues easily. Has been able to ambulate in the hallway with a walker. Reports mild nausea. No vomiting. Denies fevers and chills. Still has palpable lymph nodes in her neck which she states are unchanged. No night sweats. Appetite decreased. Reports constipation, but states this is her baseline.    REVIEW OF SYSTEMS:   Constitutional: Denies fevers, chills.  Reports fatigue and anorexia. Eyes: Denies blurriness of vision Ears, nose, mouth, throat, and face: Denies mucositis or sore throat Respiratory: Denies cough, dyspnea or wheezes Cardiovascular: Denies palpitation, chest discomfort Gastrointestinal: Reports mild nausea but no vomiting.  Has constipation, but states this is her baseline. Skin: Has ongoing itching but no rashes. Lymphatics: Denies new lymphadenopathy or easy bruising Neurological:Denies numbness, tingling or new weaknesses Behavioral/Psych: Mood is stable, no new changes  Extremities: No lower extremity edema All other systems were reviewed with the patient and are negative.  I have reviewed the past medical history, past surgical history, social history and family history with the patient and they are unchanged from previous note.   PHYSICAL EXAMINATION:  Vitals:   06/19/18 0548 06/19/18 0549  BP: (!) 148/60 (!) 148/60  Pulse: 70 70  Resp: (!) 22 (!) 22  Temp: 98.1 F (36.7 C) 98.1 F (36.7 C)  SpO2: 94% 94%   Filed Weights   06/16/18 1227 06/16/18 1856  Weight: 136 lb (61.7 kg) 143 lb 8.3 oz (65.1 kg)    GENERAL:alert, no distress and comfortable SKIN: skin color, texture, turgor are normal, no rashes or significant lesions EYES: normal, Conjunctiva are pink and non-injected, sclera clear OROPHARYNX:no exudate, no erythema and lips, buccal mucosa, and tongue normal  NECK: supple, thyroid normal size, non-tender, without nodularity LYMPH:Small shotty bilateral cervical adenopathy, approximately 1  cm. LUNGS: clear to auscultation and percussion with normal breathing effort HEART: regular rate & rhythm and no murmurs and no lower extremity edema ABDOMEN:abdomen soft, non-tender and normal bowel sounds Musculoskeletal:no cyanosis of digits and no clubbing  NEURO: alert & oriented x 3 with fluent speech, no focal motor/sensory deficits  LABORATORY DATA:  I have reviewed the data as listed CMP Latest Ref Rng & Units 06/19/2018 06/18/2018 06/18/2018  Glucose 70 - 99 mg/dL 94 100(H) 86  BUN 8 - 23 mg/dL 12 14 16   Creatinine 0.44 - 1.00 mg/dL 1.18(H) 1.50(H) 1.51(H)  Sodium 135 - 145 mmol/L 140 139 140  Potassium 3.5 - 5.1 mmol/L 2.7(LL) 2.9(L) 3.5  Chloride 98 - 111 mmol/L 108 108 110  CO2 22 - 32 mmol/L 23 22 23   Calcium 8.9 - 10.3 mg/dL 10.0 10.4(H) 10.4(H)  Total Protein 6.5 - 8.1 g/dL - - -  Total Bilirubin 0.3 - 1.2 mg/dL - - -  Alkaline Phos 38 - 126 U/L - - -  AST 15 - 41 U/L - - -  ALT 0 - 44 U/L - - -    Lab Results  Component Value Date   WBC 7.0 06/18/2018   HGB 10.9 (L) 06/18/2018   HCT 33.7 (L) 06/18/2018   MCV 83.0 06/18/2018   PLT 191 06/18/2018   NEUTROABS 5.4 06/16/2018    ASSESSMENT AND PLAN: 1.Hypercalcemia -Etiology is unclear. May be related to her acute kidney injury from dehydration. -The patient reports that she has possibly been taking Tums at home. She was really unsure and could not quantify how much she is taking if she is even taking this medication. -I have spoken with the pharmacy who advises that [facility]  should be given as an outpatient. We canset her up after discharge for consideration of Xgeva. -PTH is low and ionized calcium is elevated. 1,25 di-OH Vit D is elevated. PTHrPis pending.   -Renal function and calcium level has continued to improve with aggressive hydration.  Continue hydration per hospitalist team.  2.Stage IV NHL, favoring nodal marginal zone lymphoma -The patient has a low-grade marginal zone lymphoma. -Most recent  PET scan showed diffuse lymphadenopathy but no bulky disease.Other than fatigue, she does not have any associated symptoms such as unexplained fevers, weight loss, night sweats, or progressive lymphadenopathy. -Treatment is not indicated at this time. She will be continue to monitor closely with labs and imaging.  3. Mild anemia -Likely dilutional. -Hemoglobin prior to admission has been normal. Continue to monitor.  4. Hypokalemia -Replete per hospitalist team.  Mikey Bussing, DNP, AGPCNP-BC, AOCNP

## 2018-06-19 NOTE — Progress Notes (Signed)
Patient is in the hospital, but discharge is planned soon, possibly today. Patient's husband Amanda [name], needs to get some information to Dr Maylon Peppers and reached out to me for an email address to send attachments.  Spoke with Amanda [name] and gave him my work email so that he could forward me the needed information so that I can send to Dr Maylon Peppers.

## 2018-06-19 NOTE — Discharge Summary (Signed)
Physician Discharge Summary  Amanda [name] TWS:[id number] DOB: [birth year] DOA: 06/16/2018  PCP: [name], DO  Admit date: 06/16/2018 Discharge date: 06/20/2018  Admitted From: Home Disposition: Home  Recommendations for Outpatient Follow-up:  1. Follow up with PCP in 1 week 2. Follow up with medical oncology 3. Please obtain BMP/CBC/phosphorus in three to five days 4. Please follow up on the following pending results: [place]: PT Equipment/Devices: 4 wheel walker  Discharge Condition: Stable CODE STATUS: DNR Diet recommendation: Regular diet   Brief/Interim Summary:  Admission HPI written by Hosie Poisson, MD   Chief Complaint: decreased appetite, fatigue since 2 to 3 weeks.   HPI: Amanda [name] is a 78 y.o. female with medical history significant of with lymphoma, hypertension. Hypothyroidism, splenomegaly, GERD, stage 4 NHL, low grade nodal marginal zone lymphoma no indication of treatment as per Dr Maylon Peppers,  recent recurrent pneumonia, completed two rounds of antibiotics , followed up with Dr Melvyn Novas for bronchiectasis,  lymphadenopathy, presents to [facility] for worsening fatigue, appetite loss, nausea,vomiting with dry cough, . As per the husband pt had low grade temp yesterday up to 100.2,  Chills, respiratory symptoms of cough, sob over the last 2 to 3 weeks. She denies any chest pain. No diarrhea, no dysuria, no headache, dizziness . She reports generalized weakness.  She was accepted to [facility] for further evaluation.   ED Course: on arrival she was afebrile, bp of 143/19mmh, HR 80/min. resp rate of 16/min.   Hospital course:  Acute kidney injury on CKD 3 Baseline creatinine of 1.1-1.2. Creatinine fo 1.85 on admission. Down to 1.18 prior to discharge. Patient with poor appetite and oral intake. IV fluids as mentioned below. Resolved.  Low grade temp Unsure of etiology. Possibly related to lymphoma. Tested for SARS-Cov-19 which was negative. Normal WBC.   Stage 4 non-Hodgkin lymphoma Follows with medical oncology as an outpatient  Hypercalcemia Corrected calcium of 13 on admission. PTH, 25 vitamin D are low and 1,25 vitamin D elevated. Improved with NS IV fluids and lasix. Discharge with a corrected calcium of 10.8. Concern this is secondary to lymphoma. She will need outpatient follow-up.  Hypophosphatemia Discharged with supplementation. Recheck in 3-5 days.  Hypokalemia Hypomagnesemia Supplementation given. Discharge with supplementation. Recommend recheck in 3-5 days  GERD Continue Protonix  Hypothyroidism Continue Synthroid 125 mcg  Hypertensive urgency Unknown etiology. Not on medications as an outpatient. Receiving hydralazine IV prn inpatient. Amlodipine prescribed on discharge.  Constipation Miralax  Discharge Diagnoses:  Active Problems:   Acute kidney injury [facility])   Hypercalcemia    Discharge Instructions  Discharge Instructions    Increase activity slowly   Complete by:  As directed      Allergies as of 06/20/2018      Reactions   Phenergan [promethazine Hcl] Other (See Comments)   Jerking/agitation   Levofloxacin Nausea And Vomiting   Pravastatin Sodium Nausea And Vomiting      Medication List    STOP taking these medications   Eye Vitamins & Minerals Tabs   ICAPS Tabs     TAKE these medications   acetaminophen 500 MG tablet Commonly known as:  TYLENOL Take 1,000 mg by mouth every 6 (six) hours as needed for mild pain.   albuterol (2.5 MG/3ML) 0.083% nebulizer solution Commonly known as:  PROVENTIL Take 3 mLs (2.5 mg total) by nebulization every 6 (six) hours as needed for wheezing or shortness of breath.   albuterol 108 (90 Base) MCG/ACT  inhaler Commonly known as:  VENTOLIN HFA Inhale 2 puffs into the lungs every 6 (six) hours as needed for up to 30 days for wheezing or shortness of breath.   amLODipine 5 MG tablet Commonly known as:  NORVASC Take 1 tablet (5 mg total) by mouth  daily with breakfast. Start taking on:  June 21, 2018   buPROPion 150 MG 24 hr tablet Commonly known as:  WELLBUTRIN XL TAKE 1 TABLET BY MOUTH EVERY DAY   famotidine 20 MG tablet Commonly known as:  Pepcid One at bedtime What changed:    how much to take  how to take this  when to take this   feeding supplement (ENSURE ENLIVE) Liqd Take 237 mLs by mouth 2 (two) times daily between meals.   levothyroxine 125 MCG tablet Commonly known as:  SYNTHROID TAKE 1 TABLET BY MOUTH DAILY   magnesium gluconate 500 MG tablet Commonly known as:  MAGONATE Take 1 tablet (500 mg total) by mouth daily for 3 days. Start taking on:  June 21, 2018   omeprazole 20 MG capsule Commonly known as:  PRILOSEC Take 2 x 30 min before breakfast What changed:    how much to take  how to take this  when to take this  additional instructions   ondansetron 4 MG tablet Commonly known as:  Zofran Take 1 tablet (4 mg total) by mouth every 8 (eight) hours as needed for nausea or vomiting.   Potassium Chloride ER 20 MEQ Tbcr Take 20 mEq by mouth daily for 3 days. Start taking on:  June 21, 2018            Durable Medical Equipment  (From admission, onward)         Start     Ordered   06/18/18 1424  For home use only DME 4 wheeled rolling walker with seat  Once    Question:  Patient needs a walker to treat with the following condition  Answer:  Fear for personal safety   06/18/18 1424          Allergies  Allergen Reactions  . Phenergan [Promethazine Hcl] Other (See Comments)    Jerking/agitation  . Levofloxacin Nausea And Vomiting  . Pravastatin Sodium Nausea And Vomiting    Consultations:  Medical oncology   Procedures/Studies: Dg Chest 2 View  Result Date: 06/16/2018 CLINICAL DATA:  Three weeks of coughing. Recent diagnosis of lymphoma. EXAM: CHEST - 2 VIEW COMPARISON:  May 18, 2018 FINDINGS: Continued prominence of the mediastinum consistent with known  adenopathy/lymphoma. The cardiomediastinal silhouette is stable. No pneumothorax. Interstitial and parenchymal opacity in the bases persists but is mildly improved since May 18, 2018. No other changes. IMPRESSION: 1. Continued adenopathy in the mediastinum. 2. Continued interstitial and parenchymal opacities in the bases, mildly improved since May 18, 2018. These opacities were favored to represent lymphomatous involvement based on a PET-CT from May 29, 2018. Electronically Signed   By: Dorise Bullion III M.D   On: 06/16/2018 13:09   Nm Pet Image Initial (pi) Skull Base To Thigh  Result Date: 05/29/2018 CLINICAL DATA:  Initial treatment strategy for thoracic lymphadenopathy. EXAM: NUCLEAR MEDICINE PET SKULL BASE TO THIGH TECHNIQUE: 7.0 mCi F-18 FDG was injected intravenously. Full-ring PET imaging was performed from the skull base to thigh after the radiotracer. CT data was obtained and used for attenuation correction and anatomic localization. Fasting blood glucose: 114 mg/dl COMPARISON:  Chest CT 05/19/2018 FINDINGS: Mediastinal blood pool activity: SUV max 2.4  NECK: Hypermetabolic cervical lymphadenopathy identified bilaterally. 9 mm short axis left index level III lymph node demonstrates SUV max = 5.9. Symmetric uptake identified in the oropharynx. Incidental CT findings: none CHEST: Hypermetabolic supraclavicular, subpectoral, axillary, mediastinal, and bilateral hilar lymphadenopathy evident. *12 mm short axis left subpectoral node (50/4) demonstrates SUV max = 11.2. *14 mm short axis right subpectoral node (55/4) demonstrates SUV max = 15.1. *2.1 cm short axis subcarinal node is hypermetabolic with SUV max = 94.4. Nodular airspace disease noted bilaterally with a basilar predominance. Areas of more confluent interstitial and airspace opacity noted in the right middle lobe with SUV max = 8.4. hypermetabolic disease in the inferior lingula demonstrates SUV max = 5.8. Incidental CT findings: Diffuse  interstitial thickening noted in the lower lungs with areas of parenchymal nodularity. Tiny right pleural effusion evident. Bilateral mosaic attenuation is nonspecific but may reflect small airways disease. ABDOMEN/PELVIS: Spleen is diffusely hypermetabolic with SUV max = 5.9. No substantial splenomegaly with 12.5 cm craniocaudal length. Hypermetabolic lymphadenopathy is seen in the gastrohepatic ligament, hepatoduodenal ligament, and retroperitoneal space of the abdomen and along both pelvic sidewalls and both groin regions. *10 mm short axis aortocaval index node (110/4) demonstrates SUV max = 10. *10 mm short axis left para-aortic node (131/4) demonstrates SUV max = 8.5. *15 mm short axis index node right external iliac chain (166/4) has SUV max = 12.3. *6 mm left groin node (181/4) demonstrates SUV max = 5.1. Incidental CT findings: There is abdominal aortic atherosclerosis without aneurysm. SKELETON: Small hypermetabolic lesion identified inferior tip of left scapula without underlying lytic or sclerotic abnormality on CT imaging. SUV max = 3.3. Small hypermetabolic focus in the roof of the left acetabulum demonstrates SUV max = 4. No underlying lesion evident by CT. Incidental CT findings: none IMPRESSION: 1. Hypermetabolic lymphadenopathy in the neck, chest, abdomen, and pelvis. Lymphoma is considered the most likely etiology although diffuse metastatic involvement can not be completely excluded. 2. Interstitial and parenchymal disease in both lungs with a basilar predominance and areas of more focal consolidative change in the right middle lobe, lingula, and posterior lower lobes. These areas of the lungs are hypermetabolic, compatible with tumor involvement. 3. Small hypermetabolic foci identified in the inferior left scapula and left acetabulum, suspicious for marrow involvement. 4.  Aortic Atherosclerois (ICD10-170.0) Electronically Signed   By: Misty Stanley M.D.   On: 05/29/2018 09:55      Subjective: Weak today but improved from previous.  Discharge Exam: Vitals:   06/20/18 0950 06/20/18 0959  BP:    Pulse:    Resp:    Temp:    SpO2: 97% 95%   Vitals:   06/20/18 0429 06/20/18 0446 06/20/18 0950 06/20/18 0959  BP:  (!) 150/76    Pulse:      Resp:      Temp:      TempSrc:      SpO2:   97% 95%  Weight: 64 kg     Height:        General: Pt is alert, awake, not in acute distress Cardiovascular: RRR, S1/S2 +, no rubs, no gallops Respiratory: CTA bilaterally, no wheezing, no rhonchi Abdominal: Soft, NT, ND, bowel sounds + Extremities: no edema, no cyanosis    The results of significant diagnostics from this hospitalization (including imaging, microbiology, ancillary and laboratory) are listed below for reference.     Microbiology: Recent Results (from the past 240 hour(s))  SARS Coronavirus 2 [facility] order, Performed in [facility] hospital lab)  Status: None   Collection Time: 06/16/18  5:27 PM  Result Value Ref Range Status   SARS Coronavirus 2 NEGATIVE NEGATIVE Final    Comment: (NOTE) If result is NEGATIVE SARS-CoV-2 target nucleic acids are NOT DETECTED. The SARS-CoV-2 RNA is generally detectable in upper and lower  respiratory specimens during the acute phase of infection. The lowest  concentration of SARS-CoV-2 viral copies this assay can detect is 250  copies / mL. A negative result does not preclude SARS-CoV-2 infection  and should not be used as the sole basis for treatment or other  patient management decisions.  A negative result may occur with  improper specimen collection / handling, submission of specimen other  than nasopharyngeal swab, presence of viral mutation(s) within the  areas targeted by this assay, and inadequate number of viral copies  (<250 copies / mL). A negative result must be combined with clinical  observations, patient history, and epidemiological information. If result is POSITIVE SARS-CoV-2 target nucleic acids  are DETECTED. The SARS-CoV-2 RNA is generally detectable in upper and lower  respiratory specimens dur ing the acute phase of infection.  Positive  results are indicative of active infection with SARS-CoV-2.  Clinical  correlation with patient history and other diagnostic information is  necessary to determine patient infection status.  Positive results do  not rule out bacterial infection or co-infection with other viruses. If result is PRESUMPTIVE POSTIVE SARS-CoV-2 nucleic acids MAY BE PRESENT.   A presumptive positive result was obtained on the submitted specimen  and confirmed on repeat testing.  While 2019 novel coronavirus  (SARS-CoV-2) nucleic acids may be present in the submitted sample  additional confirmatory testing may be necessary for epidemiological  and / or clinical management purposes  to differentiate between  SARS-CoV-2 and other Sarbecovirus currently known to infect humans.  If clinically indicated additional testing with an alternate test  methodology [phone]) is advised. The SARS-CoV-2 RNA is generally  detectable in upper and lower respiratory sp ecimens during the acute  phase of infection. The expected result is Negative. Fact Sheet for Patients:  StrictlyIdeas.no Fact Sheet for Healthcare Providers: BankingDealers.co.za This test is not yet approved or cleared by the [place] FDA and has been authorized for detection and/or diagnosis of SARS-CoV-2 by FDA under an Emergency Use Authorization (EUA).  This EUA will remain in effect (meaning this test can be used) for the duration of the COVID-19 declaration under Section 564(b)(1) of the Act, 21 U.S.C. section 360bbb-3(b)(1), unless the authorization is terminated or revoked sooner. Performed at [facility], [place] [address] [place], [place] [address]   Culture, Urine     Status: Abnormal   Collection Time: 06/16/18 10:55 PM  Result Value Ref Range  Status   Specimen Description   Final    URINE, CLEAN CATCH Performed at [facility], [place] [address], [place], [place] [address]    Special Requests   Final    NONE Performed at [facility], [place] [address] [place], [place] [address]    Culture MULTIPLE SPECIES PRESENT, SUGGEST RECOLLECTION (A)  Final   Report Status 06/18/2018 FINAL  Final     Labs: BNP (last 3 results) Recent Labs    06/05/18 1700  BNP 72   Basic Metabolic Panel: Recent Labs  Lab 06/17/18 1706 06/18/18 0444 06/18/18 1458 06/19/18 0450 06/20/18 0354  NA 140 140 139 140 140  K 3.4* 3.5 2.9* 2.7* 3.5  CL 108 110 108  108 107  CO2 24 23 22 23 24   GLUCOSE 96 86 100* 94 88  BUN 20 16 14 12 10   CREATININE 1.57* 1.51* 1.50* 1.18* 1.31*  CALCIUM 11.3* 10.4* 10.4* 10.0 10.2  MG 1.6*  --  1.6*  --  1.6*  PHOS  --  2.0* 1.7* 2.0* 3.7   Liver Function Tests: Recent Labs  Lab 06/16/18 1313 06/18/18 0444 06/19/18 0450 06/20/18 0354  AST 24  --   --   --   ALT 14  --   --   --   ALKPHOS 61  --   --   --   BILITOT 0.4  --   --   --   PROT 6.9  --   --   --   ALBUMIN 4.0 3.0* 3.1* 3.2*   No results for input(s): LIPASE, AMYLASE in the last 168 hours. No results for input(s): AMMONIA in the last 168 hours. CBC: Recent Labs  Lab 06/16/18 1313 06/17/18 0525 06/18/18 1458  WBC 8.2 6.4 7.0  NEUTROABS 5.4  --   --   HGB 12.6 10.5* 10.9*  HCT 39.6 32.8* 33.7*  MCV 81.6 83.2 83.0  PLT 245 200 191   Cardiac Enzymes: Recent Labs  Lab 06/16/18 1313  TROPONINI <0.03   BNP: Invalid input(s): POCBNP CBG: Recent Labs  Lab 06/16/18 1248  GLUCAP 107*   D-Dimer No results for input(s): DDIMER in the last 72 hours. Hgb A1c No results for input(s): HGBA1C in the last 72 hours. Lipid Profile No results for input(s): CHOL, HDL, LDLCALC, TRIG, CHOLHDL, LDLDIRECT in the last 72 hours. Thyroid function studies No results for input(s): TSH, T4TOTAL, T3FREE,  THYROIDAB in the last 72 hours.  Invalid input(s): FREET3 Anemia work up No results for input(s): VITAMINB12, FOLATE, FERRITIN, TIBC, IRON, RETICCTPCT in the last 72 hours. Urinalysis    Component Value Date/Time   COLORURINE YELLOW 06/16/2018 1404   APPEARANCEUR CLEAR 06/16/2018 1404   LABSPEC 1.010 06/16/2018 1404   PHURINE 7.0 06/16/2018 1404   GLUCOSEU NEGATIVE 06/16/2018 1404   HGBUR NEGATIVE 06/16/2018 1404   BILIRUBINUR NEGATIVE 06/16/2018 1404   BILIRUBINUR neg 06/05/2015 1654   KETONESUR NEGATIVE 06/16/2018 1404   PROTEINUR NEGATIVE 06/16/2018 1404   UROBILINOGEN 0.2 06/05/2015 1654   UROBILINOGEN 1.0 11/15/2014 1625   NITRITE NEGATIVE 06/16/2018 1404   LEUKOCYTESUR SMALL (A) 06/16/2018 1404   Sepsis Labs Invalid input(s): PROCALCITONIN,  WBC,  LACTICIDVEN Microbiology Recent Results (from the past 240 hour(s))  SARS Coronavirus 2 [facility] order, Performed in [place] hospital lab)     Status: None   Collection Time: 06/16/18  5:27 PM  Result Value Ref Range Status   SARS Coronavirus 2 NEGATIVE NEGATIVE Final    Comment: (NOTE) If result is NEGATIVE SARS-CoV-2 target nucleic acids are NOT DETECTED. The SARS-CoV-2 RNA is generally detectable in upper and lower  respiratory specimens during the acute phase of infection. The lowest  concentration of SARS-CoV-2 viral copies this assay can detect is 250  copies / mL. A negative result does not preclude SARS-CoV-2 infection  and should not be used as the sole basis for treatment or other  patient management decisions.  A negative result may occur with  improper specimen collection / handling, submission of specimen other  than nasopharyngeal swab, presence of viral mutation(s) within the  areas targeted by this assay, and inadequate number of viral copies  (<250 copies / mL). A negative result must be combined with clinical  observations, patient history, and epidemiological information. If result is POSITIVE  SARS-CoV-2 target nucleic acids are DETECTED. The SARS-CoV-2 RNA is generally detectable in upper and lower  respiratory specimens dur ing the acute phase of infection.  Positive  results are indicative of active infection with SARS-CoV-2.  Clinical  correlation with patient history and other diagnostic information is  necessary to determine patient infection status.  Positive results do  not rule out bacterial infection or co-infection with other viruses. If result is PRESUMPTIVE POSTIVE SARS-CoV-2 nucleic acids MAY BE PRESENT.   A presumptive positive result was obtained on the submitted specimen  and confirmed on repeat testing.  While 2019 novel coronavirus  (SARS-CoV-2) nucleic acids may be present in the submitted sample  additional confirmatory testing may be necessary for epidemiological  and / or clinical management purposes  to differentiate between  SARS-CoV-2 and other Sarbecovirus currently known to infect humans.  If clinically indicated additional testing with an alternate test  methodology [phone]) is advised. The SARS-CoV-2 RNA is generally  detectable in upper and lower respiratory sp ecimens during the acute  phase of infection. The expected result is Negative. Fact Sheet for Patients:  StrictlyIdeas.no Fact Sheet for Healthcare Providers: BankingDealers.co.za This test is not yet approved or cleared by the [place] FDA and has been authorized for detection and/or diagnosis of SARS-CoV-2 by FDA under an Emergency Use Authorization (EUA).  This EUA will remain in effect (meaning this test can be used) for the duration of the COVID-19 declaration under Section 564(b)(1) of the Act, 21 U.S.C. section 360bbb-3(b)(1), unless the authorization is terminated or revoked sooner. Performed at [facility], [place] [address] [place], [place] [address]   Culture, Urine     Status: Abnormal   Collection Time: 06/16/18  10:55 PM  Result Value Ref Range Status   Specimen Description   Final    URINE, CLEAN CATCH Performed at [facility], [place] [address] [place], [place] [address]    Special Requests   Final    NONE Performed at [facility], [place] [address], [place], [place] [address]    Culture MULTIPLE SPECIES PRESENT, SUGGEST RECOLLECTION (A)  Final   Report Status 06/18/2018 FINAL  Final    SIGNED:   Cordelia Poche, MD [facility] 06/20/2018, 11:48 AM

## 2018-06-19 NOTE — Progress Notes (Signed)
This RN has assumed care over the pt at this time. Pt resting in bed. No request at this time. Agree with previous nurse's assessment.

## 2018-06-19 NOTE — Progress Notes (Signed)
PT Cancellation Note  Patient Details Name: Amanda [name] MRN: [id number] DOB: 1941/03/01   Cancelled Treatment:     Pt declined due to fatigue "getting up and down to bathroom a lot today" (voiding).  Pt hopes to D/C to home this weekend with spouse.  Pt has agreed to [facility] and a rollator was ordered.     Rica Koyanagi  PTA Acute  Rehabilitation Services Pager      [phone] Office      [phone]

## 2018-06-20 LAB — MAGNESIUM: Magnesium: 1.6 mg/dL — ABNORMAL LOW (ref 1.7–2.4)

## 2018-06-20 LAB — RENAL FUNCTION PANEL
Albumin: 3.2 g/dL — ABNORMAL LOW (ref 3.5–5.0)
Anion gap: 9 (ref 5–15)
BUN: 10 mg/dL (ref 8–23)
CO2: 24 mmol/L (ref 22–32)
Calcium: 10.2 mg/dL (ref 8.9–10.3)
Chloride: 107 mmol/L (ref 98–111)
Creatinine, Ser: 1.31 mg/dL — ABNORMAL HIGH (ref 0.44–1.00)
GFR calc Af Amer: 45 mL/min — ABNORMAL LOW (ref >60)
GFR calc non Af Amer: 39 mL/min — ABNORMAL LOW (ref >60)
Glucose, Bld: 88 mg/dL (ref 70–99)
Phosphorus: 3.7 mg/dL (ref 2.5–4.6)
Potassium: 3.5 mmol/L (ref 3.5–5.1)
Sodium: 140 mmol/L (ref 135–145)

## 2018-06-20 LAB — PTH-RELATED PEPTIDE: PTH-related peptide: <2 pmol/L

## 2018-06-20 LAB — CALCIUM, URINE, RANDOM: Calcium, Ur: 10.6 mg/dL

## 2018-06-20 MED ORDER — POTASSIUM CHLORIDE ER 20 MEQ PO TBCR: 20.0000 meq | EXTENDED_RELEASE_TABLET | Freq: Every day | ORAL | 0 refills | Status: DC

## 2018-06-20 MED ORDER — ENSURE ENLIVE PO LIQD: 237.0000 mL | Freq: Two times a day (BID) | ORAL | 0 refills | Status: DC

## 2018-06-20 MED ORDER — MAGNESIUM GLUCONATE 500 MG PO TABS: 500.0000 mg | ORAL_TABLET | Freq: Every day | ORAL | 0 refills | Status: AC

## 2018-06-20 MED ORDER — AMLODIPINE BESYLATE 5 MG PO TABS: 5.0000 mg | ORAL_TABLET | Freq: Every day | ORAL | 0 refills | Status: DC

## 2018-06-20 NOTE — Progress Notes (Signed)
SATURATION QUALIFICATIONS: (This note is used to comply with regulatory documentation for home oxygen)  Patient Saturations on Room Air at Rest = 97%  Patient Saturations on Room Air while Ambulating = 95%  Patient Saturations on 2 Liters of oxygen while Ambulating = 100%  Please briefly explain why patient needs home oxygen:

## 2018-06-20 NOTE — Discharge Instructions (Addendum)
Amanda [name],  You were in the hospital because of fatigue/weakness and found to have high calcium and kidney injury. You were treated with IV fluids. Please stop taking high calcium foods and drink plenty of water to flush your calcium and improve kidney function. You will need to follow up with Dr. Maylon Peppers. Also, please follow-up with your PCP as you may need to be referred to a GI doctor for your persistent nausea. I have included information with regard to calcium containing foods. Remember, your goal is to reduce calcium intake. Please use the information as a guide for which foods to avoid.

## 2018-06-22 ENCOUNTER — Telehealth: Payer: Self-pay | Admitting: Hematology

## 2018-06-22 NOTE — Telephone Encounter (Signed)
I called and spoke with patient regarding appointments for 4/24 per 4/19 staff mesasge

## 2018-06-23 ENCOUNTER — Telehealth: Payer: Self-pay | Admitting: Family Medicine

## 2018-06-23 ENCOUNTER — Encounter: Payer: Self-pay | Admitting: Family Medicine

## 2018-06-23 ENCOUNTER — Other Ambulatory Visit: Payer: Self-pay | Admitting: *Deleted

## 2018-06-23 DIAGNOSIS — N183 Chronic kidney disease, stage 3 (moderate): Secondary | ICD-10-CM | POA: Diagnosis not present

## 2018-06-23 DIAGNOSIS — I129 Hypertensive chronic kidney disease with stage 1 through stage 4 chronic kidney disease, or unspecified chronic kidney disease: Secondary | ICD-10-CM | POA: Diagnosis not present

## 2018-06-23 DIAGNOSIS — R531 Weakness: Secondary | ICD-10-CM | POA: Diagnosis not present

## 2018-06-23 DIAGNOSIS — C8590 Non-Hodgkin lymphoma, unspecified, unspecified site: Secondary | ICD-10-CM | POA: Diagnosis not present

## 2018-06-23 DIAGNOSIS — R790 Abnormal level of blood mineral: Secondary | ICD-10-CM

## 2018-06-23 DIAGNOSIS — Z8701 Personal history of pneumonia (recurrent): Secondary | ICD-10-CM | POA: Diagnosis not present

## 2018-06-23 DIAGNOSIS — Z5181 Encounter for therapeutic drug level monitoring: Secondary | ICD-10-CM | POA: Diagnosis not present

## 2018-06-23 DIAGNOSIS — N179 Acute kidney failure, unspecified: Secondary | ICD-10-CM | POA: Diagnosis not present

## 2018-06-23 LAB — BASIC METABOLIC PANEL
BUN: 14 (ref 4–21)
Creatinine: 1.2 — AB (ref 0.5–1.1)
Glucose: 91
Potassium: 3.7 (ref 3.4–5.3)
Sodium: 129 — AB (ref 137–147)

## 2018-06-23 LAB — CBC AND DIFFERENTIAL
HCT: 35 — AB (ref 36–46)
Hemoglobin: 11.4 — AB (ref 12.0–16.0)
Neutrophils Absolute: 5
Platelets: 219 (ref 150–399)
WBC: 7

## 2018-06-23 NOTE — Telephone Encounter (Signed)
Copied from [place] [id number]. Topic: Quick Communication - Home Health Verbal Orders >> Jun 23, 2018  1:47 PM Nils Flack wrote: [facility] Number: [phone]  Requesting OT/PT/Skilled Nursing/Social Work/Speech Therapy: nursing Frequency: 1 week up to 9 weeks Discharge summary states draw labs in 3-5 days and it has been 3 days.  Also needs verbal for PT

## 2018-06-23 NOTE — Telephone Encounter (Signed)
Please advise regarding labs.

## 2018-06-23 NOTE — Telephone Encounter (Signed)
Ok to give verbal----  Amanda [name] has labs

## 2018-06-24 ENCOUNTER — Other Ambulatory Visit: Payer: Medicare Other

## 2018-06-24 ENCOUNTER — Telehealth: Payer: Self-pay

## 2018-06-24 ENCOUNTER — Telehealth: Payer: Self-pay | Admitting: Internal Medicine

## 2018-06-24 ENCOUNTER — Other Ambulatory Visit: Payer: Self-pay

## 2018-06-24 DIAGNOSIS — N183 Chronic kidney disease, stage 3 (moderate): Secondary | ICD-10-CM | POA: Diagnosis not present

## 2018-06-24 DIAGNOSIS — Z5181 Encounter for therapeutic drug level monitoring: Secondary | ICD-10-CM | POA: Diagnosis not present

## 2018-06-24 DIAGNOSIS — Z8701 Personal history of pneumonia (recurrent): Secondary | ICD-10-CM | POA: Diagnosis not present

## 2018-06-24 DIAGNOSIS — I129 Hypertensive chronic kidney disease with stage 1 through stage 4 chronic kidney disease, or unspecified chronic kidney disease: Secondary | ICD-10-CM | POA: Diagnosis not present

## 2018-06-24 DIAGNOSIS — C8590 Non-Hodgkin lymphoma, unspecified, unspecified site: Secondary | ICD-10-CM | POA: Diagnosis not present

## 2018-06-24 NOTE — Telephone Encounter (Signed)
Called and spoke with pt's husband [name]. [name] stated that pt's O2 sats have been running around 88% and this was when PT was at house. Pt is not on O2. Pt has had some nausea and a lot of wheezing that is worse when she is lying down.  Per [name], the wheezing has been going on for some time now as she had acute bronchitis which turned into pna awhile back and then pt also has lymphoma. [name] stated that pt was recently hospitalized and see in epic chart that she was hospitalized 4/14 at [facility] and was in there x4 days.  Pt is doing neb tx twice daily to see if it would help with the wheezing and [name] stated that the therapist told them that pt might need to do tx four times daily.  Dr. Melvyn Novas, please advise recs for pt. Thanks!

## 2018-06-24 NOTE — Telephone Encounter (Signed)
Called and spoke with pt's husband [name] stating the info per MW. [name] expressed understanding. Nothing further needed.

## 2018-06-24 NOTE — Telephone Encounter (Signed)
Transition Care Management Follow-up Telephone Call  ADMISSION DATE: 06/16/18 DISCHARGE DATE: 06/20/18  BMP,CBC,PHOSPHORUS TO BE DRAWN (APPOINTMENT sCHEDULED) How have you been since you were released from the hospital? Weakness with SOB Do you understand why you were in the hospital? Yes  Do you understand the discharge instrcutions? Yes  Items Reviewed:  Medications reviewed: Yes   Allergies reviewed:Yes  Dietary changes reviewed:Regular per patient   Referrals reviewed: Oncology Endocrinology   Functional Questionnaire:   Activities of Daily Living (ADLs): Per patient can perform all ADL's independently.  Any patient concerns? Disagrees with discontinuing eye vitamins.  Confirmed importance and date/time of follow-up visits scheduled: Yes   Confirmed with patient if condition begins to worsen call PCP or go to the ER. Yes     Patient was given the office number and encouragred to call back with questions or concerns. Yes

## 2018-06-24 NOTE — Telephone Encounter (Signed)
I'm very sorry to hear she's doing poorly as this is much different than @ my eval and I don't see where they specifically addressed resp status sd they were focused on correcting her calcium and kidney function based on myh review of the d/c summary.  Can't approve her for 02 over the phone for an acute change in resp status so all I can offer is increase the neb to qid and if not improving go back to er

## 2018-06-24 NOTE — Telephone Encounter (Signed)
Verbal orders given and labs drawn by nurse Donita and she will send them to [place].

## 2018-06-25 ENCOUNTER — Ambulatory Visit (INDEPENDENT_AMBULATORY_CARE_PROVIDER_SITE_OTHER): Payer: Medicare Other | Admitting: Endocrinology

## 2018-06-25 ENCOUNTER — Encounter: Payer: Self-pay | Admitting: Endocrinology

## 2018-06-25 NOTE — Patient Instructions (Addendum)
Sometimes the high blood calcium makes it more important to treat the lymphoma, but that [place] up to you and Dr Talbert Cage.   When you have blood tests with Dr Talbert Cage tomorrow, please make sure they also draw the other type of calcium test I am requesting.  If that is high, I'll prescribe for you a pill called "cinacalcet."

## 2018-06-25 NOTE — Progress Notes (Signed)
Subjective:    Patient ID: Amanda [name], female    DOB: Jul 24, 1940, 78 y.o.   MRN: [id number]  HPI  telehealth visit today via doxy video visit.  Alternatives to telehealth are presented to this patient, and the patient agrees to the telehealth visit. Pt is advised of the cost of the visit, and agrees to this, also.   Patient is at home, and I am at the office.   Pt is referred by Dr Maylon Peppers, for hypercalcemia.  Pt was noted to have moderate hypercalcemia last week (it was normal in 2018). She has never had parathyroid probs, sarcoidosis, PUD, pancreatitis, recent severe injury, depression, or bony fracture.  she does not take vitamin-D or A supplements, but she takes a multivitamin.  Pt has no h/o prolonged immobilization.  Pt denies taking antacids, Li++, or HCTZ.  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  She took a course of prednisone x 10 days, 3 weeks ago, for COPD.  She has moderate dryness of the mouth, and assoc fatigue.   Past Medical History:  Diagnosis Date  . Anemia   . Arthritis   . Constipation, chronic   . GERD (gastroesophageal reflux disease)    zantac  . Heart murmur   . History of blood transfusion [place]  . Hyperlipidemia   . Hypothyroidism   . Lymphoproliferative disorder ([place])   . Macular degeneration 2013   Both eyes   . Osteopenia   . Pneumonia   . PONV (postoperative nausea and vomiting)    needs little anesthesia  . Shingles   . Shortness of breath    on exertion  . Spleen enlarged   . SUI (stress urinary incontinence, female)   . Wears glasses     Past Surgical History:  Procedure Laterality Date  . BREAST EXCISIONAL BIOPSY Left 1980  . CARPAL TUNNEL RELEASE  1999  . CATARACT EXTRACTION  2009, 2011   BOTH EYES  . [place]  . COLONOSCOPY      Dr Cristina Gong  . DILATION AND CURETTAGE OF UTERUS     X2  . HYSTEROSCOPY W/D&C  01/07/2012   Procedure: DILATATION AND CURETTAGE /HYSTEROSCOPY;  Surgeon: Terrance Mass, MD;   Location: [facility];  Service: Gynecology;  Laterality: N/A;  intrauterine foley catheter for tamponode   . LYMPH NODE BIOPSY Left 05/26/2018   Procedure: LEFT AXILLARY LYMPH NODE BIOPSY;  Surgeon: Fanny Skates, MD;  Location: [facility];  Service: General;  Laterality: Left;  . TONSILLECTOMY    . TONSILLECTOMY AND ADENOIDECTOMY    . TUBAL LIGATION     BY LAPAROSCOPY  . WISDOM TOOTH EXTRACTION      Social History   Socioeconomic History  . Marital status: Married    Spouse name: Not on file  . Number of children: Not on file  . Years of education: Not on file  . Highest education level: Not on file  Occupational History  . Not on file  Social Needs  . Financial resource strain: Not on file  . Food insecurity:    Worry: Not on file    Inability: Not on file  . Transportation needs:    Medical: Not on file    Non-medical: Not on file  Tobacco Use  . Smoking status: Never Smoker  . Smokeless tobacco: Never Used  Substance and Sexual Activity  . Alcohol use: Yes    Alcohol/week: 0.0 standard drinks    Comment: Red  wine occasionally  . Drug use: No  . Sexual activity: Never    Birth control/protection: Post-menopausal  Lifestyle  . Physical activity:    Days per week: Not on file    Minutes per session: Not on file  . Stress: Not on file  Relationships  . Social connections:    Talks on phone: Not on file    Gets together: Not on file    Attends religious service: Not on file    Active member of club or organization: Not on file    Attends meetings of clubs or organizations: Not on file    Relationship status: Not on file  . Intimate partner violence:    Fear of current or ex partner: Not on file    Emotionally abused: Not on file    Physically abused: Not on file    Forced sexual activity: Not on file  Other Topics Concern  . Not on file  Social History Narrative  . Not on file    Current Outpatient Medications on File Prior to Visit  Medication Sig Dispense  Refill  . acetaminophen (TYLENOL) 500 MG tablet Take 1,000 mg by mouth every 6 (six) hours as needed for mild pain.    [name] albuterol (PROVENTIL HFA;VENTOLIN HFA) 108 (90 Base) MCG/ACT inhaler Inhale 2 puffs into the lungs every 6 (six) hours as needed for up to 30 days for wheezing or shortness of breath. 1 Inhaler 2  . albuterol (PROVENTIL) (2.5 MG/3ML) 0.083% nebulizer solution Take 3 mLs (2.5 mg total) by nebulization every 6 (six) hours as needed for wheezing or shortness of breath. 75 mL 12  . amLODipine (NORVASC) 5 MG tablet Take 1 tablet (5 mg total) by mouth daily with breakfast. 30 tablet 0  . buPROPion (WELLBUTRIN XL) 150 MG 24 hr tablet TAKE 1 TABLET BY MOUTH EVERY DAY 90 tablet 0  . famotidine (PEPCID) 20 MG tablet One at bedtime (Patient taking differently: Take 20 mg by mouth at bedtime. One at bedtime ) 30 tablet 11  . feeding supplement, ENSURE ENLIVE, (ENSURE ENLIVE) LIQD Take 237 mLs by mouth 2 (two) times daily between meals. 60 Bottle 0  . levothyroxine (SYNTHROID, LEVOTHROID) 125 MCG tablet TAKE 1 TABLET BY MOUTH DAILY 90 tablet 0  . omeprazole (PRILOSEC) 20 MG capsule Take 2 x 30 min before breakfast (Patient taking differently: Take 20 mg by mouth daily. )    . ondansetron (ZOFRAN) 4 MG tablet Take 1 tablet (4 mg total) by mouth every 8 (eight) hours as needed for nausea or vomiting. 20 tablet 0  . potassium chloride 20 MEQ TBCR Take 20 mEq by mouth daily for 3 days. 3 tablet 0   No current facility-administered medications on file prior to visit.     Allergies  Allergen Reactions  . Phenergan [Promethazine Hcl] Other (See Comments)    Jerking/agitation  . Levofloxacin Nausea And Vomiting  . Pravastatin Sodium Nausea And Vomiting    Family History  Problem Relation Age of Onset  . Ovarian cancer Mother   . Breast cancer Mother 19  . Hypertension Father   . Prostate cancer Father   . Kidney failure Father   . Diabetes Father   . Hyperlipidemia Brother   . COPD  Paternal Grandfather   . Stroke Maternal Grandfather   . Hypercalcemia Neg Hx     Review of Systems Denies weight loss, edema, diarrhea, sore throat, polyuria, depression, numbness, and back pain.  She has doe and cough.  She has slight memory loss.  She sees opthal for blurry vision.  Hematuria is resolved.  She has a mild rash on the trunk of the body, and itching.      Objective:   Physical Exam    Lab Results  Component Value Date   PTH 9 (L) 06/16/2018   PTH Comment 06/16/2018   CALCIUM 10.2 06/20/2018   PHOS 3.7 06/20/2018   1,25-OH vit-D=136  Lab Results  Component Value Date   CREATININE 1.31 (H) 06/20/2018   BUN 10 06/20/2018   NA 140 06/20/2018   K 3.5 06/20/2018   CL 107 06/20/2018   CO2 24 06/20/2018    DEXA (2019):  Worst T-score is Femur Neck Right: 0.752 g/cm2 with a T-score of -2.1  PTHrP is undetectable.   Lab Results  Component Value Date   TSH 0.582 06/16/2018   25-OH vit-D=20  Lab Results  Component Value Date   ALT 14 06/16/2018   AST 24 06/16/2018   ALKPHOS 61 06/16/2018   BILITOT 0.4 06/16/2018       Assessment & Plan:  NHL, new elev 1,25-OH-vit-D, due to NHL COPD: recent course of prednisone did not prevent hypercalcemia, so cinacalcet is considered.  However, this may not help, either, as PTH is low Hypoalbuminuria: in this setting, we'll check ionized Ca++, when she has labs drawn tomorrow, at oncol appt.   Low 25-OH vit-D.  We'll avoid supplementing this for now.    Patient Instructions  Sometimes the high blood calcium makes it more important to treat the lymphoma, but that [place] up to you and Dr Talbert Cage.   When you have blood tests with Dr Talbert Cage tomorrow, please make sure they also draw the other type of calcium test I am requesting.  If that is high, I'll prescribe for you a pill called "cinacalcet."

## 2018-06-26 ENCOUNTER — Telehealth: Payer: Self-pay | Admitting: *Deleted

## 2018-06-26 ENCOUNTER — Emergency Department (HOSPITAL_BASED_OUTPATIENT_CLINIC_OR_DEPARTMENT_OTHER): Payer: Medicare Other

## 2018-06-26 ENCOUNTER — Ambulatory Visit: Payer: Medicare Other | Admitting: Hematology

## 2018-06-26 ENCOUNTER — Other Ambulatory Visit: Payer: Self-pay

## 2018-06-26 ENCOUNTER — Encounter (HOSPITAL_BASED_OUTPATIENT_CLINIC_OR_DEPARTMENT_OTHER): Payer: Self-pay | Admitting: Emergency Medicine

## 2018-06-26 ENCOUNTER — Inpatient Hospital Stay (HOSPITAL_BASED_OUTPATIENT_CLINIC_OR_DEPARTMENT_OTHER)
Admission: EM | Admit: 2018-06-26 | Discharge: 2018-07-01 | DRG: 640 | Disposition: A | Discharge status: Home Health | Payer: Medicare Other | Attending: Internal Medicine | Admitting: Internal Medicine

## 2018-06-26 ENCOUNTER — Ambulatory Visit: Payer: Self-pay | Admitting: Family Medicine

## 2018-06-26 ENCOUNTER — Other Ambulatory Visit: Payer: Medicare Other

## 2018-06-26 ENCOUNTER — Telehealth: Payer: Self-pay | Admitting: Internal Medicine

## 2018-06-26 DIAGNOSIS — D509 Iron deficiency anemia, unspecified: Secondary | ICD-10-CM | POA: Diagnosis present

## 2018-06-26 DIAGNOSIS — J918 Pleural effusion in other conditions classified elsewhere: Secondary | ICD-10-CM | POA: Diagnosis present

## 2018-06-26 DIAGNOSIS — R161 Splenomegaly, not elsewhere classified: Secondary | ICD-10-CM | POA: Diagnosis present

## 2018-06-26 DIAGNOSIS — I10 Essential (primary) hypertension: Secondary | ICD-10-CM | POA: Diagnosis present

## 2018-06-26 DIAGNOSIS — R062 Wheezing: Secondary | ICD-10-CM

## 2018-06-26 DIAGNOSIS — I129 Hypertensive chronic kidney disease with stage 1 through stage 4 chronic kidney disease, or unspecified chronic kidney disease: Secondary | ICD-10-CM | POA: Diagnosis not present

## 2018-06-26 DIAGNOSIS — E8779 Other fluid overload: Principal | ICD-10-CM | POA: Diagnosis present

## 2018-06-26 DIAGNOSIS — R7989 Other specified abnormal findings of blood chemistry: Secondary | ICD-10-CM

## 2018-06-26 DIAGNOSIS — Y95 Nosocomial condition: Secondary | ICD-10-CM | POA: Diagnosis present

## 2018-06-26 DIAGNOSIS — H3530 Unspecified macular degeneration: Secondary | ICD-10-CM | POA: Diagnosis not present

## 2018-06-26 DIAGNOSIS — K219 Gastro-esophageal reflux disease without esophagitis: Secondary | ICD-10-CM | POA: Diagnosis present

## 2018-06-26 DIAGNOSIS — Z79899 Other long term (current) drug therapy: Secondary | ICD-10-CM

## 2018-06-26 DIAGNOSIS — R0602 Shortness of breath: Secondary | ICD-10-CM | POA: Diagnosis not present

## 2018-06-26 DIAGNOSIS — I248 Other forms of acute ischemic heart disease: Secondary | ICD-10-CM | POA: Diagnosis present

## 2018-06-26 DIAGNOSIS — J9601 Acute respiratory failure with hypoxia: Secondary | ICD-10-CM | POA: Diagnosis present

## 2018-06-26 DIAGNOSIS — J90 Pleural effusion, not elsewhere classified: Secondary | ICD-10-CM

## 2018-06-26 DIAGNOSIS — R778 Other specified abnormalities of plasma proteins: Secondary | ICD-10-CM

## 2018-06-26 DIAGNOSIS — R05 Cough: Secondary | ICD-10-CM | POA: Diagnosis not present

## 2018-06-26 DIAGNOSIS — J479 Bronchiectasis, uncomplicated: Secondary | ICD-10-CM | POA: Diagnosis not present

## 2018-06-26 DIAGNOSIS — Z79890 Hormone replacement therapy (postmenopausal): Secondary | ICD-10-CM | POA: Diagnosis not present

## 2018-06-26 DIAGNOSIS — Z20822 Contact with and (suspected) exposure to covid-19: Secondary | ICD-10-CM

## 2018-06-26 DIAGNOSIS — N179 Acute kidney failure, unspecified: Secondary | ICD-10-CM | POA: Diagnosis present

## 2018-06-26 DIAGNOSIS — K5909 Other constipation: Secondary | ICD-10-CM | POA: Diagnosis present

## 2018-06-26 DIAGNOSIS — C8580 Other specified types of non-Hodgkin lymphoma, unspecified site: Secondary | ICD-10-CM | POA: Diagnosis present

## 2018-06-26 DIAGNOSIS — Z95828 Presence of other vascular implants and grafts: Secondary | ICD-10-CM

## 2018-06-26 DIAGNOSIS — N183 Chronic kidney disease, stage 3 (moderate): Secondary | ICD-10-CM | POA: Diagnosis not present

## 2018-06-26 DIAGNOSIS — M8580 Other specified disorders of bone density and structure, unspecified site: Secondary | ICD-10-CM | POA: Diagnosis not present

## 2018-06-26 DIAGNOSIS — E039 Hypothyroidism, unspecified: Secondary | ICD-10-CM | POA: Diagnosis present

## 2018-06-26 DIAGNOSIS — C8300 Small cell B-cell lymphoma, unspecified site: Secondary | ICD-10-CM | POA: Diagnosis present

## 2018-06-26 DIAGNOSIS — R4789 Other speech disturbances: Secondary | ICD-10-CM | POA: Diagnosis not present

## 2018-06-26 DIAGNOSIS — R0902 Hypoxemia: Secondary | ICD-10-CM

## 2018-06-26 DIAGNOSIS — Z8701 Personal history of pneumonia (recurrent): Secondary | ICD-10-CM | POA: Diagnosis not present

## 2018-06-26 DIAGNOSIS — Z20828 Contact with and (suspected) exposure to other viral communicable diseases: Secondary | ICD-10-CM | POA: Diagnosis present

## 2018-06-26 DIAGNOSIS — C8590 Non-Hodgkin lymphoma, unspecified, unspecified site: Secondary | ICD-10-CM | POA: Diagnosis not present

## 2018-06-26 DIAGNOSIS — D649 Anemia, unspecified: Secondary | ICD-10-CM

## 2018-06-26 DIAGNOSIS — J189 Pneumonia, unspecified organism: Secondary | ICD-10-CM | POA: Diagnosis not present

## 2018-06-26 DIAGNOSIS — E785 Hyperlipidemia, unspecified: Secondary | ICD-10-CM | POA: Diagnosis present

## 2018-06-26 DIAGNOSIS — IMO0001 Reserved for inherently not codable concepts without codable children: Secondary | ICD-10-CM

## 2018-06-26 DIAGNOSIS — Z5181 Encounter for therapeutic drug level monitoring: Secondary | ICD-10-CM | POA: Diagnosis not present

## 2018-06-26 DIAGNOSIS — C8304 Small cell B-cell lymphoma, lymph nodes of axilla and upper limb: Secondary | ICD-10-CM | POA: Diagnosis not present

## 2018-06-26 HISTORY — DX: Malignant (primary) neoplasm, unspecified: C80.1

## 2018-06-26 LAB — URINALYSIS, ROUTINE W REFLEX MICROSCOPIC
Bilirubin Urine: NEGATIVE
Glucose, UA: NEGATIVE mg/dL
Hgb urine dipstick: NEGATIVE
Ketones, ur: NEGATIVE mg/dL
Nitrite: NEGATIVE
Protein, ur: NEGATIVE mg/dL
Specific Gravity, Urine: 1.015 (ref 1.005–1.030)
pH: 6 (ref 5.0–8.0)

## 2018-06-26 LAB — CBC WITH DIFFERENTIAL/PLATELET
Abs Immature Granulocytes: 0.12 10*3/uL — ABNORMAL HIGH (ref 0.00–0.07)
Basophils Absolute: 0.1 10*3/uL (ref 0.0–0.1)
Basophils Relative: 1 %
Eosinophils Absolute: 0.3 10*3/uL (ref 0.0–0.5)
Eosinophils Relative: 4 %
HCT: 36.1 % (ref 36.0–46.0)
Hemoglobin: 11.6 g/dL — ABNORMAL LOW (ref 12.0–15.0)
Immature Granulocytes: 2 %
Lymphocytes Relative: 18 %
Lymphs Abs: 1.4 10*3/uL (ref 0.7–4.0)
MCH: 26 pg (ref 26.0–34.0)
MCHC: 32.1 g/dL (ref 30.0–36.0)
MCV: 80.9 fL (ref 80.0–100.0)
Monocytes Absolute: 1.1 10*3/uL — ABNORMAL HIGH (ref 0.1–1.0)
Monocytes Relative: 14 %
Neutro Abs: 5 10*3/uL (ref 1.7–7.7)
Neutrophils Relative %: 61 %
Platelets: 247 10*3/uL (ref 150–400)
RBC: 4.46 MIL/uL (ref 3.87–5.11)
RDW: 15.5 % (ref 11.5–15.5)
WBC: 8.1 10*3/uL (ref 4.0–10.5)
nRBC: 0 % (ref 0.0–0.2)

## 2018-06-26 LAB — COMPREHENSIVE METABOLIC PANEL
ALT: 15 U/L (ref 0–44)
AST: 28 U/L (ref 15–41)
Albumin: 3.6 g/dL (ref 3.5–5.0)
Alkaline Phosphatase: 72 U/L (ref 38–126)
Anion gap: 12 (ref 5–15)
BUN: 15 mg/dL (ref 8–23)
CO2: 24 mmol/L (ref 22–32)
Calcium: 11 mg/dL — ABNORMAL HIGH (ref 8.9–10.3)
Chloride: 96 mmol/L — ABNORMAL LOW (ref 98–111)
Creatinine, Ser: 1.21 mg/dL — ABNORMAL HIGH (ref 0.44–1.00)
GFR calc Af Amer: 49 mL/min — ABNORMAL LOW (ref >60)
GFR calc non Af Amer: 43 mL/min — ABNORMAL LOW (ref >60)
Glucose, Bld: 99 mg/dL (ref 70–99)
Potassium: 3.4 mmol/L — ABNORMAL LOW (ref 3.5–5.1)
Sodium: 132 mmol/L — ABNORMAL LOW (ref 135–145)
Total Bilirubin: 0.8 mg/dL (ref 0.3–1.2)
Total Protein: 6.6 g/dL (ref 6.5–8.1)

## 2018-06-26 LAB — SARS CORONAVIRUS 2 BY RT PCR (HOSPITAL ORDER, PERFORMED IN ~~LOC~~ HOSPITAL LAB): SARS Coronavirus 2: NEGATIVE

## 2018-06-26 LAB — CREATININE, SERUM
Creatinine, Ser: 1.27 mg/dL — ABNORMAL HIGH (ref 0.44–1.00)
GFR calc Af Amer: 46 mL/min — ABNORMAL LOW (ref >60)
GFR calc non Af Amer: 40 mL/min — ABNORMAL LOW (ref >60)

## 2018-06-26 LAB — CBC
HCT: 32.7 % — ABNORMAL LOW (ref 36.0–46.0)
Hemoglobin: 11 g/dL — ABNORMAL LOW (ref 12.0–15.0)
MCH: 26.4 pg (ref 26.0–34.0)
MCHC: 33.6 g/dL (ref 30.0–36.0)
MCV: 78.4 fL — ABNORMAL LOW (ref 80.0–100.0)
Platelets: 237 10*3/uL (ref 150–400)
RBC: 4.17 MIL/uL (ref 3.87–5.11)
RDW: 15.4 % (ref 11.5–15.5)
WBC: 7.4 10*3/uL (ref 4.0–10.5)
nRBC: 0 % (ref 0.0–0.2)

## 2018-06-26 LAB — TROPONIN I
Troponin I: 0.06 ng/mL (ref <0.03)
Troponin I: 0.1 ng/mL (ref <0.03)

## 2018-06-26 LAB — D-DIMER, QUANTITATIVE: D-Dimer, Quant: 2.29 ug/mL-FEU — ABNORMAL HIGH (ref 0.00–0.50)

## 2018-06-26 LAB — URINALYSIS, MICROSCOPIC (REFLEX): RBC / HPF: NONE SEEN RBC/hpf (ref 0–5)

## 2018-06-26 LAB — BRAIN NATRIURETIC PEPTIDE: B Natriuretic Peptide: 171.6 pg/mL — ABNORMAL HIGH (ref 0.0–100.0)

## 2018-06-26 MED ORDER — ALBUTEROL SULFATE (2.5 MG/3ML) 0.083% IN NEBU
2.5000 mg | INHALATION_SOLUTION | Freq: Four times a day (QID) | RESPIRATORY_TRACT | Status: DC | PRN
  Administered 2018-06-29: 03:00:00 2.5 mg via RESPIRATORY_TRACT
  Filled 2018-06-26 (×2): qty 3

## 2018-06-26 MED ORDER — IPRATROPIUM-ALBUTEROL 0.5-2.5 (3) MG/3ML IN SOLN
3.0000 mL | Freq: Four times a day (QID) | RESPIRATORY_TRACT | Status: DC
  Administered 2018-06-26: 3 mL via RESPIRATORY_TRACT
  Filled 2018-06-26: qty 3

## 2018-06-26 MED ORDER — BLISTEX MEDICATED EX OINT
TOPICAL_OINTMENT | CUTANEOUS | Status: DC | PRN
  Administered 2018-06-26: 1 via TOPICAL
  Filled 2018-06-26: qty 10

## 2018-06-26 MED ORDER — ONDANSETRON HCL 4 MG PO TABS: 4.0000 mg | ORAL_TABLET | Freq: Three times a day (TID) | ORAL | Status: DC | PRN

## 2018-06-26 MED ORDER — ENOXAPARIN SODIUM 40 MG/0.4ML ~~LOC~~ SOLN
40.0000 mg | SUBCUTANEOUS | Status: AC
  Administered 2018-06-26 – 2018-06-30 (×5): 40 mg via SUBCUTANEOUS
  Filled 2018-06-26 (×5): qty 0.4

## 2018-06-26 MED ORDER — VANCOMYCIN HCL 10 G IV SOLR
1250.0000 mg | INTRAVENOUS | Status: DC
  Filled 2018-06-26: qty 1250

## 2018-06-26 MED ORDER — SODIUM CHLORIDE 0.9 % IV SOLN
INTRAVENOUS | Status: DC | PRN
  Administered 2018-06-26: 14:00:00 via INTRAVENOUS

## 2018-06-26 MED ORDER — VANCOMYCIN HCL IN DEXTROSE 1-5 GM/200ML-% IV SOLN
1000.0000 mg | Freq: Once | INTRAVENOUS | Status: AC
  Administered 2018-06-26: 15:00:00 1000 mg via INTRAVENOUS
  Filled 2018-06-26: qty 200

## 2018-06-26 MED ORDER — SODIUM CHLORIDE 0.9 % IV BOLUS
500.0000 mL | Freq: Once | INTRAVENOUS | Status: AC
  Administered 2018-06-26: 13:00:00 500 mL via INTRAVENOUS

## 2018-06-26 MED ORDER — SODIUM CHLORIDE 0.9 % IV SOLN
INTRAVENOUS | Status: DC
  Administered 2018-06-26: 23:00:00 via INTRAVENOUS

## 2018-06-26 MED ORDER — FAMOTIDINE 20 MG PO TABS
20.0000 mg | ORAL_TABLET | Freq: Every day | ORAL | Status: DC
  Administered 2018-06-26 – 2018-06-30 (×5): 20 mg via ORAL
  Filled 2018-06-26 (×5): qty 1

## 2018-06-26 MED ORDER — ALBUTEROL SULFATE HFA 108 (90 BASE) MCG/ACT IN AERS: 2.0000 | INHALATION_SPRAY | Freq: Four times a day (QID) | RESPIRATORY_TRACT | Status: DC | PRN

## 2018-06-26 MED ORDER — AMLODIPINE BESYLATE 5 MG PO TABS
5.0000 mg | ORAL_TABLET | Freq: Every day | ORAL | Status: DC
  Administered 2018-06-27 – 2018-07-01 (×5): 5 mg via ORAL
  Filled 2018-06-26 (×3): qty 1
  Filled 2018-06-26: qty 2
  Filled 2018-06-26: qty 1

## 2018-06-26 MED ORDER — BUPROPION HCL ER (XL) 150 MG PO TB24
150.0000 mg | ORAL_TABLET | Freq: Every day | ORAL | Status: DC
  Administered 2018-06-27 – 2018-07-01 (×5): 150 mg via ORAL
  Filled 2018-06-26 (×5): qty 1

## 2018-06-26 MED ORDER — IOHEXOL 350 MG/ML SOLN
100.0000 mL | Freq: Once | INTRAVENOUS | Status: AC | PRN
  Administered 2018-06-26: 71 mL via INTRAVENOUS

## 2018-06-26 MED ORDER — PIPERACILLIN-TAZOBACTAM 3.375 G IVPB
3.3750 g | Freq: Once | INTRAVENOUS | Status: AC
  Administered 2018-06-26: 3.375 g via INTRAVENOUS
  Filled 2018-06-26: qty 50

## 2018-06-26 MED ORDER — SODIUM CHLORIDE 0.9 % IV SOLN
2.0000 g | Freq: Two times a day (BID) | INTRAVENOUS | Status: DC
  Administered 2018-06-26 – 2018-06-30 (×8): 2 g via INTRAVENOUS
  Filled 2018-06-26 (×12): qty 2

## 2018-06-26 MED ORDER — ACETAMINOPHEN 500 MG PO TABS: 1000.0000 mg | ORAL_TABLET | Freq: Four times a day (QID) | ORAL | Status: DC | PRN

## 2018-06-26 MED ORDER — LORAZEPAM 2 MG/ML IJ SOLN
1.0000 mg | Freq: Once | INTRAMUSCULAR | Status: AC
  Administered 2018-06-26: 14:00:00 1 mg via INTRAVENOUS
  Filled 2018-06-26: qty 1

## 2018-06-26 MED ORDER — PANTOPRAZOLE SODIUM 40 MG PO TBEC
40.0000 mg | DELAYED_RELEASE_TABLET | Freq: Every day | ORAL | Status: DC
  Administered 2018-06-27 – 2018-07-01 (×5): 40 mg via ORAL
  Filled 2018-06-26 (×5): qty 1

## 2018-06-26 MED ORDER — LEVOTHYROXINE SODIUM 25 MCG PO TABS
125.0000 ug | ORAL_TABLET | Freq: Every day | ORAL | Status: DC
  Administered 2018-06-27 – 2018-07-01 (×5): 125 ug via ORAL
  Filled 2018-06-26 (×5): qty 1

## 2018-06-26 MED ORDER — ENSURE ENLIVE PO LIQD
237.0000 mL | Freq: Two times a day (BID) | ORAL | Status: DC
  Administered 2018-06-27 – 2018-07-01 (×6): 237 mL via ORAL

## 2018-06-26 MED ORDER — POTASSIUM CHLORIDE ER 10 MEQ PO TBCR
20.0000 meq | EXTENDED_RELEASE_TABLET | Freq: Every day | ORAL | Status: DC
  Administered 2018-06-26 – 2018-06-29 (×4): 20 meq via ORAL
  Filled 2018-06-26 (×8): qty 2

## 2018-06-26 MED ORDER — FUROSEMIDE 10 MG/ML IJ SOLN
20.0000 mg | Freq: Once | INTRAMUSCULAR | Status: AC
  Administered 2018-06-26: 14:00:00 20 mg via INTRAVENOUS
  Filled 2018-06-26: qty 2

## 2018-06-26 NOTE — Progress Notes (Signed)
Pharmacy Antibiotic Note  Amanda [name] is a 78 y.o. female admitted on 06/26/2018 with pneumonia. Pharmacy has been consulted for vancomycin dosing. Also started on cefepime per MD. SCr 1.21 on admit. Patient received vancomycin 1g IV x 1 in the ER.  Plan: Continue Vancomycin 1250 mg IV Q 48 hrs. Goal AUC 400-550. Expected AUC: 517 SCr used: 1.21 Cefepime 2g IV q12h Monitor clinical progress, c/s, renal function F/u de-escalation plan/LOT, vancomycin trough as indicated   Height: 5\' 1"  (154.9 cm) Weight: 141 lb 1.5 oz (64 kg) IBW/kg (Calculated) : 47.8  Temp (24hrs), Avg:98.4 F (36.9 C), Min:98.2 F (36.8 C), Max:98.6 F (37 C)  Recent Labs  Lab 06/20/18 0354 06/26/18 1147  WBC  --  8.1  CREATININE 1.31* 1.21*    Estimated Creatinine Clearance: 32.3 mL/min (A) (by C-G formula based on SCr of 1.21 mg/dL (H)).    Allergies  Allergen Reactions  . Phenergan [Promethazine Hcl] Other (See Comments)    Jerking/agitation  . Levofloxacin Nausea And Vomiting  . Pravastatin Sodium Nausea And Vomiting    Elicia Lamp, PharmD, BCPS Please check AMION for all [facility] contact numbers Clinical Pharmacist 06/26/2018 10:01 PM

## 2018-06-26 NOTE — ED Notes (Signed)
Patient transported to CT 

## 2018-06-26 NOTE — Progress Notes (Addendum)
Patient requested to become a full code instead of DNR until she can further discuss with her husband.  Paged [facility] to inform.

## 2018-06-26 NOTE — Telephone Encounter (Signed)
Sonia Baller, speech therapist with [facility] is at the pt's house now calling regarding pt having shortness of breath.  See triage notes.  I connected into the office of Dr. Carollee Herter.  My protocol was for her to go to the ED.   They checked with Dr. Etter Sjogren and she instructed (I had Sonia Baller, myself and Shaqita conferenced in together) the pt to come to the ED at the [facility] location.  Pt and husband were agreeable to this plan.  I sent my notes to the office.   Reason for Disposition . [1] MODERATE difficulty breathing (e.g., speaks in phrases, SOB even at rest, pulse 100-120) AND [2] NEW-onset or WORSE than normal  Answer Assessment - Initial Assessment Questions 1. RESPIRATORY STATUS: "Describe your breathing?" (e.g., wheezing, shortness of breath, unable to speak, severe coughing)      Sonia Baller speech therapist with [facility].   I'm in the home with the pt.   Difficulty breathing.   RR 22-25, O2 85-87%.   No fever.  Husband said it was 99.2 yesterday.    2. ONSET: "When did this breathing problem begin?"      It started late yesterday.    3. PATTERN "Does the difficult breathing come and go, or has it been constant since it started?"      She's having problems with shortness of breath and coughing all night.  Mainly dry.   Worse with exertion.   Shortness of breath constant even laying down.     She was in the hospital last week   COVID-19 test negative at that time.   She was in the hospital for high low potassium.   Dx with lymphoma  in March.   4. SEVERITY: "How bad is your breathing?" (e.g., mild, moderate, severe)    - MILD: No SOB at rest, mild SOB with walking, speaks normally in sentences, can lay down, no retractions, pulse < 100.    - MODERATE: SOB at rest, SOB with minimal exertion and prefers to sit, cannot lie down flat, speaks in phrases, mild retractions, audible wheezing, pulse 100-120.    - SEVERE: Very SOB at rest, speaks in single words, struggling to  breathe, sitting hunched forward, retractions, pulse > 120      Having trouble breathing when up and around difficult for her to speak. 5. RECURRENT SYMPTOM: "Have you had difficulty breathing before?" If so, ask: "When was the last time?" and "What happened that time?"      She has a history of pneumonia.    She says it feels worse than when had pneumonia.   Occasional chest tightness. 6. CARDIAC HISTORY: "Do you have any history of heart disease?" (e.g., heart attack, angina, bypass surgery, angioplasty)      No  Murmur 7. LUNG HISTORY: "Do you have any history of lung disease?"  (e.g., pulmonary embolus, asthma, emphysema)     History of pneumonia only. 8. CAUSE: "What do you think is causing the breathing problem?"      She had pneumonia in March real bad.   9. OTHER SYMPTOMS: "Do you have any other symptoms? (e.g., dizziness, runny nose, cough, chest pain, fever)     When stands up gets a little faint.  No fainting.   Severe dry mouth. 10. PREGNANCY: "Is there any chance you are pregnant?" "When was your last menstrual period?"       N/A 11. TRAVEL: "Have you traveled out of the country in the  last month?" (e.g., travel history, exposures)       No travels.  Protocols used: BREATHING DIFFICULTY-A-AH

## 2018-06-26 NOTE — ED Notes (Signed)
[facility] arrived to transport pt to [facility]. Pt husband informed.

## 2018-06-26 NOTE — ED Notes (Signed)
[facility] notified (Amanda [name]) - patient ready for transport 

## 2018-06-26 NOTE — ED Notes (Signed)
Pt husband [name] would like updates [phone]

## 2018-06-26 NOTE — Telephone Encounter (Signed)
Received Lab Report results from [facility]; forwarded to provider/SLS 04/24

## 2018-06-26 NOTE — H&P (Signed)
History and Physical   Amanda [name] QQI:[id number] DOB: 1940/08/17 DOA: 06/26/2018  Referring MD/NP/PA: Dr. Venita Sheffield from [facility]  PCP: Carollee Herter, Alferd Apa, DO   Outpatient Specialists: None  Patient coming from: [facility]  Chief Complaint: Shortness of breath  HPI: Amanda [name] is a 78 y.o. female with medical history significant of stage IV non-Hodgkin's lymphoma, GERD, hypothyroidism, recent hospitalization with pneumonia, hyperlipidemia, osteoarthritis who presented to [facility] with shortness of breath and new oxygen requirement.  Patient is having dyspnea on exertion and now at rest.  She has cough but no fever no wheezing.  Patient also denies any sick contacts.  She was evaluated by her oncologist but did not think this is related to her non-Hodgkin's lymphoma.  Patient was recently hospitalized from4/14/2020 to  06/20/2018.  At that point she was having hypercalcemia.She had recent pneumonia at least twice at which point she was treated with antibiotics.  She was diagnosed with bronchiectasis.  During last hospitalization however patient had no pneumonia.  She is back now in the ER with x-ray showing multifocal pneumonia so she has been admitted to the hospital with healthcare associated pneumonia.  ED Course: Temperature is 98.6 blood pressure 120/93 pulse 92 respirate of 39 oxygen sat 88% on room air.  It is 100% on 2 L.  White count is 8.1 hemoglobin 11.6 and normal platelets.  Sodium 132 potassium 3.4 chloride 96 with creatinine 1.21.  Calcium is 11.0.  Troponin is 0.1 and glucose 99.  Urinalysis is negative.  Chest x-ray showed bilateral basal thickening with some pleural effusion.  BNP 171.  CT angiogram of the chest showed no evidence of PE but extensive thoracic adenopathy mildly progressive from previous.  Worsening lymphoma.  Bilateral pleural effusion with airspace opacity in both lower lobes and the right middle lobe and lingula.  Patient  diagnosed with healthcare associated pneumonia and is being admitted to the hospital for treatment.  Patient's COVID-19 testing was negative  Review of Systems: As per HPI otherwise 10 point review of systems negative.    Past Medical History:  Diagnosis Date   Anemia    Arthritis    Cancer ([place])    Constipation, chronic    GERD (gastroesophageal reflux disease)    zantac   Heart murmur    History of blood transfusion 1959   [facility]   Hyperlipidemia    Hypothyroidism    Lymphoproliferative disorder ([place])    Macular degeneration 2013   Both eyes    Osteopenia    Pneumonia    PONV (postoperative nausea and vomiting)    needs little anesthesia   Shingles    Shortness of breath    on exertion   Spleen enlarged    SUI (stress urinary incontinence, female)    Wears glasses     Past Surgical History:  Procedure Laterality Date   BREAST EXCISIONAL BIOPSY Left [place]   CATARACT EXTRACTION  2009, 2011   BOTH EYES   CESAREAN SECTION  1959   COLONOSCOPY      Dr Glandorf     X2   HYSTEROSCOPY W/D&C  01/07/2012   Procedure: [place] /HYSTEROSCOPY;  Surgeon: Terrance Mass, MD;  Location: [facility];  Service: Gynecology;  Laterality: N/A;  intrauterine foley catheter for tamponode    LYMPH NODE BIOPSY Left 05/26/2018   Procedure: LEFT  AXILLARY LYMPH NODE BIOPSY;  Surgeon: Fanny Skates, MD;  Location: [facility];  Service: General;  Laterality: Left;   TONSILLECTOMY     TONSILLECTOMY AND ADENOIDECTOMY     TUBAL LIGATION     BY LAPAROSCOPY   WISDOM TOOTH EXTRACTION       reports that she has never smoked. She has never used smokeless tobacco. She reports current alcohol use. She reports that she does not use drugs.  Allergies  Allergen Reactions   Phenergan [Promethazine Hcl] Other (See Comments)    Jerking/agitation   Levofloxacin Nausea And Vomiting    Pravastatin Sodium Nausea And Vomiting    Family History  Problem Relation Age of Onset   Ovarian cancer Mother    Breast cancer Mother 71   Hypertension Father    Prostate cancer Father    Kidney failure Father    Diabetes Father    Hyperlipidemia Brother    COPD Paternal Grandfather    Stroke Maternal Grandfather    Hypercalcemia Neg Hx      Prior to Admission medications   Medication Sig Start Date End Date Taking? Authorizing Provider  acetaminophen (TYLENOL) 500 MG tablet Take 1,000 mg by mouth every 6 (six) hours as needed for mild pain.    [provider]  albuterol (PROVENTIL HFA;VENTOLIN HFA) 108 (90 Base) MCG/ACT inhaler Inhale 2 puffs into the lungs every 6 (six) hours as needed for up to 30 days for wheezing or shortness of breath. 05/22/18 06/21/18  Tish Men, MD  albuterol (PROVENTIL) (2.5 MG/3ML) 0.083% nebulizer solution Take 3 mLs (2.5 mg total) by nebulization every 6 (six) hours as needed for wheezing or shortness of breath. 04/24/18   Carollee Herter, Alferd Apa, DO  amLODipine (NORVASC) 5 MG tablet Take 1 tablet (5 mg total) by mouth daily with breakfast. 06/21/18   Mariel Aloe, MD  buPROPion (WELLBUTRIN XL) 150 MG 24 hr tablet TAKE 1 TABLET BY MOUTH EVERY DAY 02/10/18   Carollee Herter, Alferd Apa, DO  famotidine (PEPCID) 20 MG tablet One at bedtime Patient taking differently: Take 20 mg by mouth at bedtime. One at bedtime  06/05/18   Tanda Rockers, MD  feeding supplement, ENSURE ENLIVE, (ENSURE ENLIVE) LIQD Take 237 mLs by mouth 2 (two) times daily between meals. 06/20/18   Mariel Aloe, MD  levothyroxine (SYNTHROID, LEVOTHROID) 125 MCG tablet TAKE 1 TABLET BY MOUTH DAILY 05/15/18   Carollee Herter, Alferd Apa, DO  omeprazole (PRILOSEC) 20 MG capsule Take 2 x 30 min before breakfast Patient taking differently: Take 20 mg by mouth daily.  06/05/18   Tanda Rockers, MD  ondansetron (ZOFRAN) 4 MG tablet Take 1 tablet (4 mg total) by mouth every 8 (eight) hours as  needed for nausea or vomiting. 06/08/18   Roma Schanz R, DO  potassium chloride 20 MEQ TBCR Take 20 mEq by mouth daily for 3 days. 06/21/18 06/24/18  Mariel Aloe, MD    Physical Exam: Vitals:   06/26/18 1900 06/26/18 1930 06/26/18 2030 06/26/18 2128  BP: (!) 145/77 140/76 (!) 150/77 (!) 159/88  Pulse: 80  80 86  Resp: (!) 28 (!) 26 (!) 24 (!) 24  Temp:    98.2 F (36.8 C)  TempSrc:    Oral  SpO2: 96%  93% 97%  Weight:      Height:          Constitutional: NAD, mild respiratory distress Vitals:   06/26/18 1900 06/26/18 1930 06/26/18 2030 06/26/18  2128  BP: (!) 145/77 140/76 (!) 150/77 (!) 159/88  Pulse: 80  80 86  Resp: (!) 28 (!) 26 (!) 24 (!) 24  Temp:    98.2 F (36.8 C)  TempSrc:    Oral  SpO2: 96%  93% 97%  Weight:      Height:       Eyes: PERRL, lids and conjunctivae normal ENMT: Mucous membranes are moist. Posterior pharynx clear of any exudate or lesions.Normal dentition.  Neck: normal, supple, no masses, no thyromegaly Respiratory: Decreased air entry bilaterally, coarse breath sounds with mild expiratory wheezing.  Increased respiratory effort. No accessory muscle use.  Cardiovascular: Regular rate and rhythm, no murmurs / rubs / gallops. No extremity edema. 2+ pedal pulses. No carotid bruits.  Abdomen: no tenderness, no masses palpated. No hepatosplenomegaly. Bowel sounds positive.  Musculoskeletal: no clubbing / cyanosis. No joint deformity upper and lower extremities. Good ROM, no contractures. Normal muscle tone.  Skin: no rashes, lesions, ulcers. No induration Neurologic: CN 2-12 grossly intact. Sensation intact, DTR normal. Strength 5/5 in all 4.  Psychiatric: Normal judgment and insight. Alert and oriented x 3. Normal mood.     Labs on Admission: I have personally reviewed following labs and imaging studies  CBC: Recent Labs  Lab 06/26/18 1147  WBC 8.1  NEUTROABS 5.0  HGB 11.6*  HCT 36.1  MCV 80.9  PLT 235   Basic Metabolic  Panel: Recent Labs  Lab 06/20/18 0354 06/26/18 1147  NA 140 132*  K 3.5 3.4*  CL 107 96*  CO2 24 24  GLUCOSE 88 99  BUN 10 15  CREATININE 1.31* 1.21*  CALCIUM 10.2 11.0*  MG 1.6*  --   PHOS 3.7  --    GFR: Estimated Creatinine Clearance: 32.3 mL/min (A) (by C-G formula based on SCr of 1.21 mg/dL (H)). Liver Function Tests: Recent Labs  Lab 06/20/18 0354 06/26/18 1147  AST  --  28  ALT  --  15  ALKPHOS  --  72  BILITOT  --  0.8  PROT  --  6.6  ALBUMIN 3.2* 3.6   No results for input(s): LIPASE, AMYLASE in the last 168 hours. No results for input(s): AMMONIA in the last 168 hours. Coagulation Profile: No results for input(s): INR, PROTIME in the last 168 hours. Cardiac Enzymes: Recent Labs  Lab 06/26/18 1147 06/26/18 1534  TROPONINI 0.06* 0.10*   BNP (last 3 results) No results for input(s): PROBNP in the last 8760 hours. HbA1C: No results for input(s): HGBA1C in the last 72 hours. CBG: No results for input(s): GLUCAP in the last 168 hours. Lipid Profile: No results for input(s): CHOL, HDL, LDLCALC, TRIG, CHOLHDL, LDLDIRECT in the last 72 hours. Thyroid Function Tests: No results for input(s): TSH, T4TOTAL, FREET4, T3FREE, THYROIDAB in the last 72 hours. Anemia Panel: No results for input(s): VITAMINB12, FOLATE, FERRITIN, TIBC, IRON, RETICCTPCT in the last 72 hours. Urine analysis:    Component Value Date/Time   COLORURINE YELLOW 06/26/2018 1301   APPEARANCEUR CLEAR 06/26/2018 1301   LABSPEC 1.015 06/26/2018 1301   PHURINE 6.0 06/26/2018 1301   GLUCOSEU NEGATIVE 06/26/2018 1301   HGBUR NEGATIVE 06/26/2018 1301   BILIRUBINUR NEGATIVE 06/26/2018 1301   BILIRUBINUR neg 06/05/2015 1654   KETONESUR NEGATIVE 06/26/2018 1301   PROTEINUR NEGATIVE 06/26/2018 1301   UROBILINOGEN 0.2 06/05/2015 1654   UROBILINOGEN 1.0 11/15/2014 1625   NITRITE NEGATIVE 06/26/2018 1301   LEUKOCYTESUR TRACE (A) 06/26/2018 1301   Sepsis  Labs: @LABRCNTIP (procalcitonin:4,lacticidven:4) )  Recent Results (from the past 240 hour(s))  Culture, Urine     Status: Abnormal   Collection Time: 06/16/18 10:55 PM  Result Value Ref Range Status   Specimen Description   Final    URINE, CLEAN CATCH Performed at [facility], [place] [address], [place], [place] [address]    Special Requests   Final    NONE Performed at [facility], [place] [address], [place], [place] [address]    Culture MULTIPLE SPECIES PRESENT, SUGGEST RECOLLECTION (A)  Final   Report Status 06/18/2018 FINAL  Final  SARS Coronavirus 2 [facility] order, Performed in [facility] hospital lab)     Status: None   Collection Time: 06/26/18 12:06 PM  Result Value Ref Range Status   SARS Coronavirus 2 NEGATIVE NEGATIVE Final    Comment: (NOTE) If result is NEGATIVE SARS-CoV-2 target nucleic acids are NOT DETECTED. The SARS-CoV-2 RNA is generally detectable in upper and lower  respiratory specimens during the acute phase of infection. The lowest  concentration of SARS-CoV-2 viral copies this assay can detect is 250  copies / mL. A negative result does not preclude SARS-CoV-2 infection  and should not be used as the sole basis for treatment or other  patient management decisions.  A negative result may occur with  improper specimen collection / handling, submission of specimen other  than nasopharyngeal swab, presence of viral mutation(s) within the  areas targeted by this assay, and inadequate number of viral copies  (<250 copies / mL). A negative result must be combined with clinical  observations, patient history, and epidemiological information. If result is POSITIVE SARS-CoV-2 target nucleic acids are DETECTED. The SARS-CoV-2 RNA is generally detectable in upper and lower  respiratory specimens dur ing the acute phase of infection.  Positive  results are indicative of active infection with SARS-CoV-2.  Clinical  correlation with  patient history and other diagnostic information is  necessary to determine patient infection status.  Positive results do  not rule out bacterial infection or co-infection with other viruses. If result is PRESUMPTIVE POSTIVE SARS-CoV-2 nucleic acids MAY BE PRESENT.   A presumptive positive result was obtained on the submitted specimen  and confirmed on repeat testing.  While 2019 novel coronavirus  (SARS-CoV-2) nucleic acids may be present in the submitted sample  additional confirmatory testing may be necessary for epidemiological  and / or clinical management purposes  to differentiate between  SARS-CoV-2 and other Sarbecovirus currently known to infect humans.  If clinically indicated additional testing with an alternate test  methodology [phone]) is advised. The SARS-CoV-2 RNA is generally  detectable in upper and lower respiratory sp ecimens during the acute  phase of infection. The expected result is Negative. Fact Sheet for Patients:  StrictlyIdeas.no Fact Sheet for Healthcare Providers: BankingDealers.co.za This test is not yet approved or cleared by the [place] FDA and has been authorized for detection and/or diagnosis of SARS-CoV-2 by FDA under an Emergency Use Authorization (EUA).  This EUA will remain in effect (meaning this test can be used) for the duration of the COVID-19 declaration under Section 564(b)(1) of the Act, 21 U.S.C. section 360bbb-3(b)(1), unless the authorization is terminated or revoked sooner. Performed at [facility], [place] [address] [place], [place] [address]      Radiological Exams on Admission: Ct Angio Chest Pe W And/or Wo Contrast  Result Date: 06/26/2018 CLINICAL DATA:  Shortness of breath with cough and elevated D-dimer levels. Symptoms for several weeks.  Recent diagnosis of lymphoma. EXAM: CT ANGIOGRAPHY CHEST WITH CONTRAST TECHNIQUE: Multidetector CT imaging of the chest was  performed using the standard protocol during bolus administration of intravenous contrast. Multiplanar CT image reconstructions and MIPs were obtained to evaluate the vascular anatomy. CONTRAST:  70mL OMNIPAQUE IOHEXOL 350 MG/ML SOLN COMPARISON:  Radiographs today, PET-CT 05/29/2018 and chest CT 05/19/2018. FINDINGS: Cardiovascular: The pulmonary arteries are well opacified with contrast to the level of the subsegmental branches. There is no evidence of acute pulmonary embolism. Stable mild atherosclerosis of the aorta, great vessels and coronary arteries. The heart is mildly enlarged. No significant pericardial effusion. Mediastinum/Nodes: Again demonstrated is extensive supraclavicular, mediastinal, hilar and axillary lymphadenopathy bilaterally. Some of these nodes have enlarged compared with the previous study, including a 1.7 cm right axillary node on image 27/6 and a 1.9 cm left axillary node on image 40/6. 2.3 cm AP window node on image 30/6 and 2.5 cm subcarinal node on image 50/6 are similar to the previous study. The thyroid gland, trachea and esophagus demonstrate no significant findings. Lungs/Pleura: There are enlarging, moderate size dependent bilateral pleural effusions. There is worsening aeration of the lungs. Chronic collapse and bronchiectasis in the right middle lobe and lingula have progressed. There is new partial collapse and opacification of both lower lobes. Underlying nodularity within the aerated portions of the lungs is grossly stable. Upper abdomen: There are multiple mildly enlarged lymph nodes within the porta hepatis and upper retroperitoneum. Stable mild splenomegaly without focal abnormality. There is mild reflux of contrast into the hepatic vein and IVC. Musculoskeletal/Chest wall: There is no chest wall mass or suspicious osseous finding. Review of the MIP images confirms the above findings. IMPRESSION: 1. No evidence of acute pulmonary embolism. 2. Extensive thoracic adenopathy,  mildly progressive from previous CT of last month, consistent with slight worsening of lymphoma. Splenomegaly and upper abdominal adenopathy are grossly stable. 3. Enlarging bilateral pleural effusions with worsening airspace opacities in both lower lobes, the right middle lobe and lingula. Underlying pulmonary nodularity is grossly stable. Pattern is nonspecific and could be secondary to superimposed atelectasis, infection or edema. Electronically Signed   By: Richardean Sale M.D.   On: 06/26/2018 17:37   Dg Chest Port 1 View  Result Date: 06/26/2018 CLINICAL DATA:  Cough, shortness of breath EXAM: PORTABLE CHEST 1 VIEW COMPARISON:  06/16/2018 FINDINGS: Bilateral mild interstitial thickening increased from the prior exam. Bilateral small pleural effusions. No pneumothorax. Stable cardiomediastinal silhouette. No acute osseous abnormality. IMPRESSION: 1. Bilateral interstitial thickening primarily at the lung bases and small pleural effusions. Differential considerations include pulmonary edema versus pneumonia. Electronically Signed   By: Kathreen Devoid   On: 06/26/2018 12:45      Assessment/Plan Principal Problem:   HCAP (healthcare-associated pneumonia) Active Problems:   Hypothyroidism   HLD (hyperlipidemia)   GERD (gastroesophageal reflux disease)   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HCC)   Hypercalcemia     #1.  Healthcare associated pneumonia: Patient will be admitted for IV antibiotic and supportive care.  Initiate cefepime and vancomycin.  Placed on oxygen.  Blood and sputum cultures obtained.  We will follow response and transition to oral antibiotics as she does better.  #2 non-Hodgkin's lymphoma: This is stage IV.  Patient is being followed by oncology.  Will defer to oncology for continued outpatient treatment.  #3 GERD: Continue with PPIs.  #4 hypothyroidism: Continue with levothyroxine.  #5 hypercalcemia: Patient has had hypercalcemia and has been on treatment.  Calcium is again  elevated.  We  will hydrate and follow calcium levels.  Oncology and endocrinology involvement may be needed.  Most likely related to her lymphoma.  #5 hyperlipidemia: Continue with home regimen and monitor closely.   DVT prophylaxis: Lovenox Code Status: DNR Family Communication: No family at bedside Disposition Plan: To be determined Consults called: None Admission status: Inpatient  Severity of Illness: The appropriate patient status for this patient is INPATIENT. Inpatient status is judged to be reasonable and necessary in order to provide the required intensity of service to ensure the patient's safety. The patient's presenting symptoms, physical exam findings, and initial radiographic and laboratory data in the context of their chronic comorbidities is felt to place them at high risk for further clinical deterioration. Furthermore, it is not anticipated that the patient will be medically stable for discharge from the hospital within 2 midnights of admission. The following factors support the patient status of inpatient.   " The patient's presenting symptoms include shortness of breath and hypoxia. " The worrisome physical exam findings include decreased air entry with coarse breath sounds. " The initial radiographic and laboratory data are worrisome because of extensive bilateral pneumonia on CT scan. " The chronic co-morbidities include non-Hodgkin's lymphoma.   * I certify that at the point of admission it is my clinical judgment that the patient will require inpatient hospital care spanning beyond 2 midnights from the point of admission due to high intensity of service, high risk for further deterioration and high frequency of surveillance required.Barbette Merino MD [facility] Pager [phone]  If 7PM-7AM, please contact night-coverage [email or web address] Password [facility]  06/26/2018, 9:59 PM

## 2018-06-26 NOTE — Telephone Encounter (Signed)
Called and spoke with pt's husband [name] who stated after phone conversation we had with him 4/22, pt ended up getting a lot worse to the point to where pt's O2 sats would not go above 85% and [name] even stated the average was 82% on room air. Due to this and pt having worsening SOB, pt was taken to [place].  [name] is unsure but he stated that there has been talk that pt might be admitted to hospital. Routing to MW as an Pharmacist, hospital.

## 2018-06-26 NOTE — ED Notes (Signed)
EDP informed of critical trop

## 2018-06-26 NOTE — ED Provider Notes (Signed)
[facility] DEPARTMENT Provider Note   CSN: [id number] Arrival date & time: 06/26/18  1115    History   Chief Complaint Chief Complaint  Patient presents with  . Shortness of Breath    HPI Amanda [name] is a 78 y.o. female.     Patient is a 78 year old female with past medical history of hyperlipidemia, lymphoproliferative disorder, GERD, and arthritis.  She presents today for evaluation of shortness of breath.  This is worsened over the past several days.  Her dyspnea is worse when she ambulates and exerts herself and relieved somewhat with rest.  She does report cough, but denies fever.  Patient was recently admitted at [facility] with pneumonia.  She was tested for COVID-19, however this was negative at that time.  The history is provided by the patient.  Shortness of Breath  Severity:  Moderate Onset quality:  Gradual Duration:  5 days Timing:  Constant Progression:  Worsening Chronicity:  New Context: activity and URI   Relieved by:  Rest Worsened by:  Activity   Past Medical History:  Diagnosis Date  . Anemia   . Arthritis   . Cancer ([place])   . Constipation, chronic   . GERD (gastroesophageal reflux disease)    zantac  . Heart murmur   . History of blood transfusion [place]  . Hyperlipidemia   . Hypothyroidism   . Lymphoproliferative disorder ([place])   . Macular degeneration 2013   Both eyes   . Osteopenia   . Pneumonia   . PONV (postoperative nausea and vomiting)    needs little anesthesia  . Shingles   . Shortness of breath    on exertion  . Spleen enlarged   . SUI (stress urinary incontinence, female)   . Wears glasses     Patient Active Problem List   Diagnosis Date Noted  . Hypercalcemia   . Weakness 06/16/2018  . Acute kidney injury ([place]) 06/16/2018  . Bronchiectasis without complication ([place]) 91/47/8295  . DOE (dyspnea on exertion) 06/05/2018  . 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  Bronchospasm 04/24/2018  . Fatigue 04/24/2018  . Facial numbness 02/17/2017  . Abnormal CT of the abdomen 10/27/2015  . Elevated serum creatinine 10/27/2015  . 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[place]) 12/05/2014  . Acute respiratory failure with hypoxia ([place]) 11/20/2014  . HLD (hyperlipidemia) 11/17/2014  . GERD (gastroesophageal reflux disease) 11/17/2014  . Cervical lymphadenitis 12/06/2013  . Family history of ovarian cancer 05/31/2013  . CAP (community acquired pneumonia) 07/02/2012  . Chest pain 07/02/2012  . Abnormal CT scan, head 07/02/2012  . Postmenopausal 03/10/2012  . Family history of breast cancer 12/12/2011  . IBS (irritable bowel syndrome) 12/12/2011  . Abdominal bloating 12/12/2011  . Chronic constipation 12/12/2011  . CARPAL TUNNEL SYNDROME, LEFT 04/21/2009  . GAIT DISTURBANCE 04/21/2009  . HYPERLIPIDEMIA 01/12/2009  . CERVICALGIA 09/12/2008  . Hypothyroidism 08/06/2006  . OSTEOPENIA 08/06/2006  . URINARY INCONTINENCE 08/06/2006  . SKIN CANCER, HX OF 08/06/2006    Past Surgical History:  Procedure Laterality Date  . BREAST EXCISIONAL BIOPSY Left 1980  . CARPAL TUNNEL RELEASE  1999  . CATARACT EXTRACTION  2009, 2011   BOTH EYES  . [place]  . COLONOSCOPY      Dr Cristina Gong  . DILATION AND CURETTAGE OF UTERUS     X2  . HYSTEROSCOPY W/D&C  01/07/2012   Procedure: DILATATION AND CURETTAGE /HYSTEROSCOPY;  Surgeon:  Terrance Mass, MD;  Location: [facility];  Service: Gynecology;  Laterality: N/A;  intrauterine foley catheter for tamponode   . LYMPH NODE BIOPSY Left 05/26/2018   Procedure: LEFT AXILLARY LYMPH NODE BIOPSY;  Surgeon: Fanny Skates, MD;  Location: [facility];  Service: General;  Laterality: Left;  . TONSILLECTOMY    . TONSILLECTOMY AND ADENOIDECTOMY    . TUBAL LIGATION     BY LAPAROSCOPY  . WISDOM TOOTH EXTRACTION       OB History    Gravida  3   Para  3   Term      Preterm  2   AB      Living  2      SAB      TAB      Ectopic      Multiple      Live Births  2            Home Medications    Prior to Admission medications   Medication Sig Start Date End Date Taking? Authorizing Provider  acetaminophen (TYLENOL) 500 MG tablet Take 1,000 mg by mouth every 6 (six) hours as needed for mild pain.    [provider]  albuterol (PROVENTIL HFA;VENTOLIN HFA) 108 (90 Base) MCG/ACT inhaler Inhale 2 puffs into the lungs every 6 (six) hours as needed for up to 30 days for wheezing or shortness of breath. 05/22/18 06/21/18  Tish Men, MD  albuterol (PROVENTIL) (2.5 MG/3ML) 0.083% nebulizer solution Take 3 mLs (2.5 mg total) by nebulization every 6 (six) hours as needed for wheezing or shortness of breath. 04/24/18   Carollee Herter, Alferd Apa, DO  amLODipine (NORVASC) 5 MG tablet Take 1 tablet (5 mg total) by mouth daily with breakfast. 06/21/18   Mariel Aloe, MD  buPROPion (WELLBUTRIN XL) 150 MG 24 hr tablet TAKE 1 TABLET BY MOUTH EVERY DAY 02/10/18   Carollee Herter, Alferd Apa, DO  famotidine (PEPCID) 20 MG tablet One at bedtime Patient taking differently: Take 20 mg by mouth at bedtime. One at bedtime  06/05/18   Tanda Rockers, MD  feeding supplement, ENSURE ENLIVE, (ENSURE ENLIVE) LIQD Take 237 mLs by mouth 2 (two) times daily between meals. 06/20/18   Mariel Aloe, MD  levothyroxine (SYNTHROID, LEVOTHROID) 125 MCG tablet TAKE 1 TABLET BY MOUTH DAILY 05/15/18   Carollee Herter, Alferd Apa, DO  omeprazole (PRILOSEC) 20 MG capsule Take 2 x 30 min before breakfast Patient taking differently: Take 20 mg by mouth daily.  06/05/18   Tanda Rockers, MD  ondansetron (ZOFRAN) 4 MG tablet Take 1 tablet (4 mg total) by mouth every 8 (eight) hours as needed for nausea or vomiting. 06/08/18   Roma Schanz R, DO  potassium chloride 20 MEQ TBCR Take 20 mEq by mouth daily for 3 days. 06/21/18 06/24/18  Mariel Aloe, MD    Family History Family History  Problem Relation Age of Onset  . Ovarian  cancer Mother   . Breast cancer Mother 34  . Hypertension Father   . Prostate cancer Father   . Kidney failure Father   . Diabetes Father   . Hyperlipidemia Brother   . COPD Paternal Grandfather   . Stroke Maternal Grandfather   . Hypercalcemia Neg Hx     Social History Social History   Tobacco Use  . Smoking status: Never Smoker  . Smokeless tobacco: Never Used  Substance Use Topics  . Alcohol use: Yes    Alcohol/week:  0.0 standard drinks    Comment: Red wine occasionally  . Drug use: No     Allergies   Phenergan [promethazine hcl]; Levofloxacin; and Pravastatin sodium   Review of Systems Review of Systems  Respiratory: Positive for shortness of breath.   All other systems reviewed and are negative.    Physical Exam Updated Vital Signs BP 131/70 (BP Location: Left Arm)   Pulse 84   Temp 98.6 F (37 C) (Oral)   Resp (!) 26   Ht 5\' 1"  (1.549 m)   Wt 64 kg   SpO2 95%   BMI 26.66 kg/m   Physical Exam Vitals signs and nursing note reviewed.  Constitutional:      General: She is not in acute distress.    Appearance: She is well-developed. She is not diaphoretic.  HENT:     Head: Normocephalic and atraumatic.  Neck:     Musculoskeletal: Normal range of motion and neck supple.  Cardiovascular:     Rate and Rhythm: Normal rate and regular rhythm.     Heart sounds: No murmur. No friction rub. No gallop.   Pulmonary:     Effort: Pulmonary effort is normal. No respiratory distress.     Breath sounds: Normal breath sounds. No wheezing.  Abdominal:     General: Bowel sounds are normal. There is no distension.     Palpations: Abdomen is soft.     Tenderness: There is no abdominal tenderness.  Musculoskeletal: Normal range of motion.  Skin:    General: Skin is warm and dry.  Neurological:     Mental Status: She is alert and oriented to person, place, and time.      ED Treatments / Results  Labs (all labs ordered are listed, but only abnormal results  are displayed) Labs Reviewed  SARS CORONAVIRUS 2 (HOSPITAL ORDER, [facility])  COMPREHENSIVE METABOLIC PANEL  CBC WITH DIFFERENTIAL/PLATELET  BRAIN NATRIURETIC PEPTIDE  D-DIMER, QUANTITATIVE (NOT AT [facility])  TROPONIN I  URINALYSIS, ROUTINE W REFLEX MICROSCOPIC    EKG EKG Interpretation  Date/Time:  Friday June 26 2018 11:37:50 EDT Ventricular Rate:  82 PR Interval:    QRS Duration: 89 QT Interval:  372 QTC Calculation: 435 R Axis:   6 Text Interpretation:  Sinus rhythm Probable left atrial enlargement Anterior infarct, old Confirmed by Veryl Speak [phone]) on 06/26/2018 11:56:56 AM   Radiology No results found.  Procedures Procedures (including critical care time)  Medications Ordered in ED Medications  sodium chloride 0.9 % bolus 500 mL (has no administration in time range)     Initial Impression / Assessment and Plan / ED Course  I have reviewed the triage vital signs and the nursing notes.  Pertinent labs & imaging results that were available during my care of the patient were reviewed by me and considered in my medical decision making (see chart for details).  Patient is a 78 year old female with history of myeloproliferative disorder presenting with complaints of dyspnea.  She was recently admitted for what sounds like pneumonia.  Patient's oxygen saturations initially 88% on room air.  Chest x-ray shows possible bilateral pleural effusions versus infiltrates.  Patient has no fever or white count and COVID 19 testing negative.  Patient has an elevated d-dimer and will undergo a CT scan to rule out pulmonary embolism.  Care will be signed out to Dr. Rogene Houston at shift change.  He will obtain the results of the CT scan and determine the final disposition.  Patient will most likely require admission for treatment of pneumonia or CHF.  She was given a dose of IV Lasix as well as vancomycin and Zosyn.  CRITICAL CARE Performed by: Veryl Speak  Total critical care time: 35 minutes Critical care time was exclusive of separately billable procedures and treating other patients. Critical care was necessary to treat or prevent imminent or life-threatening deterioration. Critical care was time spent personally by me on the following activities: development of treatment plan with patient and/or surrogate as well as nursing, discussions with consultants, evaluation of patient's response to treatment, examination of patient, obtaining history from patient or surrogate, ordering and performing treatments and interventions, ordering and review of laboratory studies, ordering and review of radiographic studies, pulse oximetry and re-evaluation of patient's condition.   Final Clinical Impressions(s) / ED Diagnoses   Final diagnoses:  None    ED Discharge Orders    None       Veryl Speak, MD 06/27/18 1017

## 2018-06-26 NOTE — Telephone Encounter (Signed)
Spoke with PT, patient, husband and nurse all together and advised per Dr. Etter Sjogren patient needs to go to ER.  Ok to go by car.  Her BP was 158/77.

## 2018-06-26 NOTE — ED Notes (Signed)
Pt tolerating 2L nasal cannula.

## 2018-06-26 NOTE — ED Notes (Addendum)
IV attempted x 2 by RT without success and x 1 by this RN.

## 2018-06-26 NOTE — Consult Note (Addendum)
[place] NOTE  Patient Care Team: Carollee Herter, Alferd Apa, DO as PCP - General (Family Medicine) Jari Pigg, MD as Consulting Physician (Dermatology) Monna Fam, MD as Consulting Physician (Ophthalmology) Terrance Mass, MD (Inactive) as Consulting Physician (Gynecology) Cordelia Poche, RN as Oncology Nurse Navigator Tish Men, MD as Medical Oncologist (Hematology)  HEME/ONC OVERVIEW: 1. Stage IV NHL, favoring nodal marginal zone lymphoma  -05/2018:  ? CT chest showed pathologic lymphadenopathy involving bilateral axilla, mediastinum as well as multiple small lung nodules and possible splenomegaly ? PET showed FDG-avid lymphadenopathy in neck, chest, abdomen and pelvis, questionable involvement of the lung, and focal involvement of left scapula and acetabulum  ? Left axillary LN excisional bx: NHL, favoring low-grade marginal zone lymphoma; Cyclin-D1 neg ([facility])  TREATMENT REGIMEN:  TBD   ASSESSMENT & PLAN:   Acute hypoxic respiratory failure -Etiology unclear; CXR showed bilateral interstitial thickening primary at the lung bases and small pleural effusions, possibly infection vs. Pneumonia -CTA chest ordered to rule out PTE, given elevated D-dimer and new onset hypoxia -Patient has been started on broad spectrum abx with vancomcyin and Zosyn, given recent hospitalization -Patient wanted to know when/if she needs treatment for her lymphoma -I discussed with the patient at length that it would be very unusual for lymphoma to cause acute respiratory failure, and before she can be considered for any chemotherapy, her respiratory status will have to improve significantly  -I will defer further management to the ED physician and the hospitalist team  -If dyspnea doesn't improve, I recommend considering pulmonary medicine and ID consults to rule out any atypical infections   Stage IV NHL, favoring nodal marginal zone lymphoma  -Generally speaking, nodal  marginal zone lymphoma tends to be relatively indolent and doesn't require urgent treatment, and can be monitored -However, in the patient's case, she has had recurrent hypercalcemia secondary to increased 1,25 Vitamin D production, likely from the lymphoma, which may be an indication for treatment -In addition, the FDG avidity of some of the thoracic LN's is more hypermetabolic than expected (SUV >10); ideally, biopsy of one of the LN's is preferred to rule out transformation, but the location is difficult to access, and patient just recently had LN biopsy in 04/2018, making rapid transformation less likely -Once she improves clinically from the respiratory standpoint, we can revisit the discussion regarding treatment, possibly rituximab with dose-reduced bendamustine -Prior to discharge, she may benefit from a port placement to facilitate outpatient treatment; we will re-assess this once the patient improves clinically  Thank you for caring for our mutual patient. Please do not hesitate to contact me if there are any questions.   Tish Men, MD 06/26/2018 3:58 PM   CHIEF COMPLAINTS/PURPOSE OF CONSULTATION:  "I am really short of breath"  HISTORY OF PRESENTING ILLNESS:   Amanda [name] is 78 y.o. female with Stage IV NHL, favoring nodal marginal zone lymphoma, who was taken to the [facility] for worsening respiratory failure. The history was limited due to the patient not being able to speak in full sentences. She reports that she has had progressive dyspnea over the past 2-3 weeks, and gotten especially worse over the past 2-3 days. The home health nurse went to assess the patient this morning, and her oxygen saturation was in the low 80's, for which she was sent to the ER for further evaluation. She reports associated cough but denied any fever. COVID-19 testing was negative in the ER.   MEDICAL HISTORY:  Past Medical History:  Diagnosis Date  . Anemia   . Arthritis   . Cancer ([place])    . Constipation, chronic   . GERD (gastroesophageal reflux disease)    zantac  . Heart murmur   . History of blood transfusion [place]  . Hyperlipidemia   . Hypothyroidism   . Lymphoproliferative disorder ([place])   . Macular degeneration 2013   Both eyes   . Osteopenia   . Pneumonia   . PONV (postoperative nausea and vomiting)    needs little anesthesia  . Shingles   . Shortness of breath    on exertion  . Spleen enlarged   . SUI (stress urinary incontinence, female)   . Wears glasses     SURGICAL HISTORY: Past Surgical History:  Procedure Laterality Date  . BREAST EXCISIONAL BIOPSY Left 1980  . CARPAL TUNNEL RELEASE  1999  . CATARACT EXTRACTION  2009, 2011   BOTH EYES  . [place]  . COLONOSCOPY      Dr Cristina Gong  . DILATION AND CURETTAGE OF UTERUS     X2  . HYSTEROSCOPY W/D&C  01/07/2012   Procedure: DILATATION AND CURETTAGE /HYSTEROSCOPY;  Surgeon: Terrance Mass, MD;  Location: [facility];  Service: Gynecology;  Laterality: N/A;  intrauterine foley catheter for tamponode   . LYMPH NODE BIOPSY Left 05/26/2018   Procedure: LEFT AXILLARY LYMPH NODE BIOPSY;  Surgeon: Fanny Skates, MD;  Location: [facility];  Service: General;  Laterality: Left;  . TONSILLECTOMY    . TONSILLECTOMY AND ADENOIDECTOMY    . TUBAL LIGATION     BY LAPAROSCOPY  . WISDOM TOOTH EXTRACTION      SOCIAL HISTORY: Social History   Socioeconomic History  . Marital status: Married    Spouse name: Not on file  . Number of children: Not on file  . Years of education: Not on file  . Highest education level: Not on file  Occupational History  . Not on file  Social Needs  . Financial resource strain: Not on file  . Food insecurity:    Worry: Not on file    Inability: Not on file  . Transportation needs:    Medical: Not on file    Non-medical: Not on file  Tobacco Use  . Smoking status: Never Smoker  . Smokeless tobacco: Never Used  Substance and Sexual Activity  . Alcohol  use: Yes    Alcohol/week: 0.0 standard drinks    Comment: Red wine occasionally  . Drug use: No  . Sexual activity: Never    Birth control/protection: Post-menopausal  Lifestyle  . Physical activity:    Days per week: Not on file    Minutes per session: Not on file  . Stress: Not on file  Relationships  . Social connections:    Talks on phone: Not on file    Gets together: Not on file    Attends religious service: Not on file    Active member of club or organization: Not on file    Attends meetings of clubs or organizations: Not on file    Relationship status: Not on file  . Intimate partner violence:    Fear of current or ex partner: Not on file    Emotionally abused: Not on file    Physically abused: Not on file    Forced sexual activity: Not on file  Other Topics Concern  . Not on file  Social History Narrative  . Not on  file    FAMILY HISTORY: Family History  Problem Relation Age of Onset  . Ovarian cancer Mother   . Breast cancer Mother 27  . Hypertension Father   . Prostate cancer Father   . Kidney failure Father   . Diabetes Father   . Hyperlipidemia Brother   . COPD Paternal Grandfather   . Stroke Maternal Grandfather   . Hypercalcemia Neg Hx     ALLERGIES:  is allergic to phenergan [promethazine hcl]; levofloxacin; and pravastatin sodium.  MEDICATIONS:  Current Facility-Administered Medications  Medication Dose Route Frequency Provider Last Rate Last Dose  . 0.9 %  sodium chloride infusion   Intravenous PRN Veryl Speak, MD 10 mL/hr at 06/26/18 1342    . ipratropium-albuterol (DUONEB) 0.5-2.5 (3) MG/3ML nebulizer solution 3 mL  3 mL Nebulization Q6H Zackowski, Nicki Reaper, MD      . vancomycin (VANCOCIN) IVPB 1000 mg/200 mL premix  1,000 mg Intravenous Once Veryl Speak, MD 200 mL/hr at 06/26/18 1519 1,000 mg at 06/26/18 1519   Current Outpatient Medications  Medication Sig Dispense Refill  . acetaminophen (TYLENOL) 500 MG tablet Take 1,000 mg by mouth  every 6 (six) hours as needed for mild pain.    [name] albuterol (PROVENTIL HFA;VENTOLIN HFA) 108 (90 Base) MCG/ACT inhaler Inhale 2 puffs into the lungs every 6 (six) hours as needed for up to 30 days for wheezing or shortness of breath. 1 Inhaler 2  . albuterol (PROVENTIL) (2.5 MG/3ML) 0.083% nebulizer solution Take 3 mLs (2.5 mg total) by nebulization every 6 (six) hours as needed for wheezing or shortness of breath. 75 mL 12  . amLODipine (NORVASC) 5 MG tablet Take 1 tablet (5 mg total) by mouth daily with breakfast. 30 tablet 0  . buPROPion (WELLBUTRIN XL) 150 MG 24 hr tablet TAKE 1 TABLET BY MOUTH EVERY DAY 90 tablet 0  . famotidine (PEPCID) 20 MG tablet One at bedtime (Patient taking differently: Take 20 mg by mouth at bedtime. One at bedtime ) 30 tablet 11  . feeding supplement, ENSURE ENLIVE, (ENSURE ENLIVE) LIQD Take 237 mLs by mouth 2 (two) times daily between meals. 60 Bottle 0  . levothyroxine (SYNTHROID, LEVOTHROID) 125 MCG tablet TAKE 1 TABLET BY MOUTH DAILY 90 tablet 0  . omeprazole (PRILOSEC) 20 MG capsule Take 2 x 30 min before breakfast (Patient taking differently: Take 20 mg by mouth daily. )    . ondansetron (ZOFRAN) 4 MG tablet Take 1 tablet (4 mg total) by mouth every 8 (eight) hours as needed for nausea or vomiting. 20 tablet 0  . potassium chloride 20 MEQ TBCR Take 20 mEq by mouth daily for 3 days. 3 tablet 0    REVIEW OF SYSTEMS:   Constitutional: ( - ) fevers, ( - )  chills , ( - ) night sweats Eyes: ( - ) blurriness of vision, ( - ) double vision, ( - ) watery eyes Ears, nose, mouth, throat, and face: ( - ) mucositis, ( - ) sore throat Respiratory: ( + ) cough, ( + ) dyspnea, ( - ) wheezes Cardiovascular: ( - ) palpitation, ( - ) chest discomfort, ( - ) lower extremity swelling Gastrointestinal:  ( - ) nausea, ( - ) heartburn, ( - ) change in bowel habits Skin: ( - ) abnormal skin rashes Lymphatics: ( - ) new lymphadenopathy, ( - ) easy bruising Neurological: ( - )  numbness, ( - ) tingling, ( - ) new weaknesses Behavioral/Psych: ( - ) mood change, ( - )  new changes  All other systems were reviewed with the patient and are negative.  PHYSICAL EXAMINATION:  Vitals:   06/26/18 1500 06/26/18 1533  BP: (!) 147/86 (!) 145/73  Pulse:  80  Resp: (!) 30 (!) 25  Temp:    SpO2:  96%   Filed Weights   06/26/18 1131  Weight: 141 lb 1.5 oz (64 kg)    GENERAL: mild to moderate distress, speaking in short sentences due to dyspnea, audible wheezing SKIN: skin color, texture, turgor are normal, no rashes or significant lesions EYES: conjunctiva are pink and non-injected, sclera clear OROPHARYNX: dry oral mucosa  NECK: supple, non-tender LUNGS: decreased air movement bilaterally on anterior auscultation, audible wheezing  HEART: regular rate & rhythm, no murmurs, no lower extremity edema ABDOMEN: soft, non-tender, non-distended, normal bowel sounds  PSYCH: awake, speaking in short sentences due to dyspnea  LABORATORY DATA:  I have reviewed the data as listed Lab Results  Component Value Date   WBC 8.1 06/26/2018   HGB 11.6 (L) 06/26/2018   HCT 36.1 06/26/2018   MCV 80.9 06/26/2018   PLT 247 06/26/2018   Lab Results  Component Value Date   NA 132 (L) 06/26/2018   K 3.4 (L) 06/26/2018   CL 96 (L) 06/26/2018   CO2 24 06/26/2018    RADIOGRAPHIC STUDIES: I have personally reviewed the radiological images as listed and agreed with the findings in the report. Dg Chest 2 View  Result Date: 06/16/2018 CLINICAL DATA:  Three weeks of coughing. Recent diagnosis of lymphoma. EXAM: CHEST - 2 VIEW COMPARISON:  May 18, 2018 FINDINGS: Continued prominence of the mediastinum consistent with known adenopathy/lymphoma. The cardiomediastinal silhouette is stable. No pneumothorax. Interstitial and parenchymal opacity in the bases persists but is mildly improved since May 18, 2018. No other changes. IMPRESSION: 1. Continued adenopathy in the mediastinum. 2.  Continued interstitial and parenchymal opacities in the bases, mildly improved since May 18, 2018. These opacities were favored to represent lymphomatous involvement based on a PET-CT from May 29, 2018. Electronically Signed   By: Dorise Bullion III M.D   On: 06/16/2018 13:09   Nm Pet Image Initial (pi) Skull Base To Thigh  Result Date: 05/29/2018 CLINICAL DATA:  Initial treatment strategy for thoracic lymphadenopathy. EXAM: NUCLEAR MEDICINE PET SKULL BASE TO THIGH TECHNIQUE: 7.0 mCi F-18 FDG was injected intravenously. Full-ring PET imaging was performed from the skull base to thigh after the radiotracer. CT data was obtained and used for attenuation correction and anatomic localization. Fasting blood glucose: 114 mg/dl COMPARISON:  Chest CT 05/19/2018 FINDINGS: Mediastinal blood pool activity: SUV max 2.4 NECK: Hypermetabolic cervical lymphadenopathy identified bilaterally. 9 mm short axis left index level III lymph node demonstrates SUV max = 5.9. Symmetric uptake identified in the oropharynx. Incidental CT findings: none CHEST: Hypermetabolic supraclavicular, subpectoral, axillary, mediastinal, and bilateral hilar lymphadenopathy evident. *12 mm short axis left subpectoral node (50/4) demonstrates SUV max = 11.2. *14 mm short axis right subpectoral node (55/4) demonstrates SUV max = 15.1. *2.1 cm short axis subcarinal node is hypermetabolic with SUV max = 66.0. Nodular airspace disease noted bilaterally with a basilar predominance. Areas of more confluent interstitial and airspace opacity noted in the right middle lobe with SUV max = 8.4. hypermetabolic disease in the inferior lingula demonstrates SUV max = 5.8. Incidental CT findings: Diffuse interstitial thickening noted in the lower lungs with areas of parenchymal nodularity. Tiny right pleural effusion evident. Bilateral mosaic attenuation is nonspecific but may reflect small airways disease.  ABDOMEN/PELVIS: Spleen is diffusely hypermetabolic with  SUV max = 5.9. No substantial splenomegaly with 12.5 cm craniocaudal length. Hypermetabolic lymphadenopathy is seen in the gastrohepatic ligament, hepatoduodenal ligament, and retroperitoneal space of the abdomen and along both pelvic sidewalls and both groin regions. *10 mm short axis aortocaval index node (110/4) demonstrates SUV max = 10. *10 mm short axis left para-aortic node (131/4) demonstrates SUV max = 8.5. *15 mm short axis index node right external iliac chain (166/4) has SUV max = 12.3. *6 mm left groin node (181/4) demonstrates SUV max = 5.1. Incidental CT findings: There is abdominal aortic atherosclerosis without aneurysm. SKELETON: Small hypermetabolic lesion identified inferior tip of left scapula without underlying lytic or sclerotic abnormality on CT imaging. SUV max = 3.3. Small hypermetabolic focus in the roof of the left acetabulum demonstrates SUV max = 4. No underlying lesion evident by CT. Incidental CT findings: none IMPRESSION: 1. Hypermetabolic lymphadenopathy in the neck, chest, abdomen, and pelvis. Lymphoma is considered the most likely etiology although diffuse metastatic involvement can not be completely excluded. 2. Interstitial and parenchymal disease in both lungs with a basilar predominance and areas of more focal consolidative change in the right middle lobe, lingula, and posterior lower lobes. These areas of the lungs are hypermetabolic, compatible with tumor involvement. 3. Small hypermetabolic foci identified in the inferior left scapula and left acetabulum, suspicious for marrow involvement. 4.  Aortic Atherosclerois (ICD10-170.0) Electronically Signed   By: Misty Stanley M.D.   On: 05/29/2018 09:55   Dg Chest Port 1 View  Result Date: 06/26/2018 CLINICAL DATA:  Cough, shortness of breath EXAM: PORTABLE CHEST 1 VIEW COMPARISON:  06/16/2018 FINDINGS: Bilateral mild interstitial thickening increased from the prior exam. Bilateral small pleural effusions. No pneumothorax.  Stable cardiomediastinal silhouette. No acute osseous abnormality. IMPRESSION: 1. Bilateral interstitial thickening primarily at the lung bases and small pleural effusions. Differential considerations include pulmonary edema versus pneumonia. Electronically Signed   By: Kathreen Devoid   On: 06/26/2018 12:45    PATHOLOGY: I have reviewed the pathology reports as documented in the oncologist history.

## 2018-06-26 NOTE — ED Notes (Signed)
Pt and pt husband updated with plan of care.

## 2018-06-26 NOTE — ED Triage Notes (Signed)
Cough and SOB x 1 week. Pt unable to speak in complete sentences.

## 2018-06-27 ENCOUNTER — Inpatient Hospital Stay (HOSPITAL_COMMUNITY): Payer: Medicare Other

## 2018-06-27 DIAGNOSIS — R0602 Shortness of breath: Secondary | ICD-10-CM

## 2018-06-27 LAB — COMPREHENSIVE METABOLIC PANEL
ALT: 13 U/L (ref 0–44)
ALT: 17 U/L (ref 0–44)
AST: 22 U/L (ref 15–41)
AST: 28 U/L (ref 15–41)
Albumin: 2.8 g/dL — ABNORMAL LOW (ref 3.5–5.0)
Albumin: 2.9 g/dL — ABNORMAL LOW (ref 3.5–5.0)
Alkaline Phosphatase: 61 U/L (ref 38–126)
Alkaline Phosphatase: 65 U/L (ref 38–126)
Anion gap: 12 (ref 5–15)
Anion gap: 12 (ref 5–15)
BUN: 13 mg/dL (ref 8–23)
BUN: 12 mg/dL (ref 8–23)
CO2: 23 mmol/L (ref 22–32)
CO2: 20 mmol/L — ABNORMAL LOW (ref 22–32)
Calcium: 10.2 mg/dL (ref 8.9–10.3)
Calcium: 10.2 mg/dL (ref 8.9–10.3)
Chloride: 101 mmol/L (ref 98–111)
Chloride: 105 mmol/L (ref 98–111)
Creatinine, Ser: 1.32 mg/dL — ABNORMAL HIGH (ref 0.44–1.00)
Creatinine, Ser: 1.12 mg/dL — ABNORMAL HIGH (ref 0.44–1.00)
GFR calc Af Amer: 44 mL/min — ABNORMAL LOW (ref >60)
GFR calc Af Amer: 54 mL/min — ABNORMAL LOW (ref >60)
GFR calc non Af Amer: 38 mL/min — ABNORMAL LOW (ref >60)
GFR calc non Af Amer: 47 mL/min — ABNORMAL LOW (ref >60)
Glucose, Bld: 87 mg/dL (ref 70–99)
Glucose, Bld: 110 mg/dL — ABNORMAL HIGH (ref 70–99)
Potassium: 3.9 mmol/L (ref 3.5–5.1)
Potassium: 3.7 mmol/L (ref 3.5–5.1)
Sodium: 136 mmol/L (ref 135–145)
Sodium: 137 mmol/L (ref 135–145)
Total Bilirubin: 0.7 mg/dL (ref 0.3–1.2)
Total Bilirubin: 0.7 mg/dL (ref 0.3–1.2)
Total Protein: 5 g/dL — ABNORMAL LOW (ref 6.5–8.1)
Total Protein: 5.4 g/dL — ABNORMAL LOW (ref 6.5–8.1)

## 2018-06-27 LAB — HIV ANTIBODY (ROUTINE TESTING W REFLEX): HIV Screen 4th Generation wRfx: NONREACTIVE

## 2018-06-27 LAB — BLOOD GAS, ARTERIAL
Acid-Base Excess: 0.6 mmol/L (ref 0.0–2.0)
Bicarbonate: 24.1 mmol/L (ref 20.0–28.0)
Drawn by: 441661
O2 Content: 3 L/min
O2 Saturation: 95.9 %
Patient temperature: 98.6
pCO2 arterial: 34.5 mmHg (ref 32.0–48.0)
pH, Arterial: 7.458 — ABNORMAL HIGH (ref 7.350–7.450)
pO2, Arterial: 77.1 mmHg — ABNORMAL LOW (ref 83.0–108.0)

## 2018-06-27 LAB — ECHOCARDIOGRAM LIMITED
Height: 61 in
Weight: 2264 oz

## 2018-06-27 LAB — TROPONIN I
Troponin I: 0.08 ng/mL (ref <0.03)
Troponin I: 0.07 ng/mL (ref <0.03)
Troponin I: 0.07 ng/mL (ref <0.03)
Troponin I: 0.07 ng/mL (ref <0.03)

## 2018-06-27 MED ORDER — ORAL CARE MOUTH RINSE
15.0000 mL | Freq: Two times a day (BID) | OROMUCOSAL | Status: DC
  Administered 2018-06-27 – 2018-06-30 (×8): 15 mL via OROMUCOSAL

## 2018-06-27 MED ORDER — IPRATROPIUM-ALBUTEROL 0.5-2.5 (3) MG/3ML IN SOLN
3.0000 mL | Freq: Four times a day (QID) | RESPIRATORY_TRACT | Status: DC
  Administered 2018-06-28: 08:00:00 3 mL via RESPIRATORY_TRACT
  Filled 2018-06-27: qty 3

## 2018-06-27 MED ORDER — GUAIFENESIN 100 MG/5ML PO SOLN
5.0000 mL | ORAL | Status: DC | PRN
  Administered 2018-06-27 – 2018-06-30 (×3): 100 mg via ORAL
  Filled 2018-06-27 (×3): qty 5

## 2018-06-27 MED ORDER — BUDESONIDE 0.25 MG/2ML IN SUSP
0.2500 mg | Freq: Two times a day (BID) | RESPIRATORY_TRACT | Status: DC
  Administered 2018-06-27 – 2018-07-01 (×8): 0.25 mg via RESPIRATORY_TRACT
  Filled 2018-06-27 (×8): qty 2

## 2018-06-27 MED ORDER — IPRATROPIUM-ALBUTEROL 0.5-2.5 (3) MG/3ML IN SOLN
3.0000 mL | Freq: Three times a day (TID) | RESPIRATORY_TRACT | Status: DC
  Administered 2018-06-27 (×2): 3 mL via RESPIRATORY_TRACT
  Filled 2018-06-27 (×2): qty 3

## 2018-06-27 MED ORDER — GUAIFENESIN-DM 100-10 MG/5ML PO SYRP
5.0000 mL | ORAL_SOLUTION | ORAL | Status: DC | PRN
  Administered 2018-06-27 (×2): 5 mL via ORAL
  Filled 2018-06-27 (×2): qty 5

## 2018-06-27 MED ORDER — IPRATROPIUM-ALBUTEROL 0.5-2.5 (3) MG/3ML IN SOLN
3.0000 mL | RESPIRATORY_TRACT | Status: DC
  Administered 2018-06-27: 21:00:00 3 mL via RESPIRATORY_TRACT
  Filled 2018-06-27: qty 3

## 2018-06-27 MED ORDER — METHYLPREDNISOLONE SODIUM SUCC 40 MG IJ SOLR
40.0000 mg | Freq: Three times a day (TID) | INTRAMUSCULAR | Status: DC
  Administered 2018-06-27 – 2018-06-29 (×5): 40 mg via INTRAVENOUS
  Filled 2018-06-27 (×5): qty 1

## 2018-06-27 NOTE — Progress Notes (Signed)
Around 1830 this evening patient  having cough and shortness of breath with minimal activities and generalized weakness, v/s stable , rapid response and MD called, see orders in epic. Steroid given per order . Report given to [place], patient transfer to 6e per order for possible BI PAP.

## 2018-06-27 NOTE — Progress Notes (Signed)
  Echocardiogram 2D Echocardiogram has been performed.  Amanda [name] 06/27/2018, 4:24 PM

## 2018-06-27 NOTE — Significant Event (Signed)
Rapid Response Event Note  Overview: Time Called: [place] Time: 9458 Event Type: Respiratory  Initial Focused Assessment: Patient admitted with PNA.  This afternoon she is more SOB and can only speak in short phrases. 146/86  HR 87  RR 28  O2 sat 94% on 2L [place] Lung sounds decreased bases Heart tones regular   Interventions: PCXR done Labs done Spoke with MD via phone, updated on pt status Plan transfer to progressive care  Plan of Care (if not transferred):  Event Summary: Name of Physician Notified: Ramesh at      at    Outcome: Transferred (Comment)  Event End Time: [address]  [place] Ip

## 2018-06-27 NOTE — Progress Notes (Addendum)
PROGRESS NOTE    Amanda [name]  IOE:[id number] DOB: Sep 03, 1940 DOA: 06/26/2018 PCP: [name], DO   Brief Narrative: [phone] w/ Hx stage IV NHLrecent ADMIT for low grade fever- neg covid, aki, hypercalcemia (06/16/2018 to  06/20/2018), p/w to [facility] with shortness of breath and new oxygen requirement., DOE and at rest, cough but no fever/no wheezing, no sick contacts.Seen by oncologist likely not from [place]. Now in  ER w Rf 39 oxygen sat 88%  RA, WBC 8.1 creat 1.21.  Calcium is 11.0.  Troponin is 0.1 and glucose 99.  Urinalysis is negative.  Chest x-ray showed bilateral basal thickening with some pleural effusion.  BNP 171.  CTA NO PE but Extensive thoracic adenopathy mildly progressive from previous  CT last month.Worsening of lymphoma.  Splenomegaly and upper abdomen adenopathy are grossly stable.  Enlarging bilateral pleural effusions with worsening airspace opacities in both lower lobes the right middle lobe and lingula. Patern is  nonspecific but could be seen in the secondary to superimposed atelectasis,infection or edema. COVID-19 testing was negative. Pt was started on broad spectrumfor antibiotics and was admitted.  Subjective: Complains of some cough but overall feeling better.No chest pain shortness of breath nausea vomiting.  Sitting at the edge of the bed and trying to have her breakfast this morning.  Assessment & Plan:   Air space opacity noted in the CT scan with bilateral pleural effusions, possible HCAP, continue on empiric antibiotics-vanco/cefepime for now.  Overall afebrile, WBC count stable. If remains stable deescalate antibiotics soon  Acute respiratory failure with hypoxia due to #1, no PE. CTA chest as above. Continue supplemental oxygen and weight. Continue bronchodilators.  Followed by pulmonary as o/p for bronchiectasis.  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 Given her worsening adenopathy, will need to have oncology evaluate her.   Hypercalcemia, in the setting of NHL , recently seen by endocrinology Dr Loanne Drilling 4/23, 25 vit d was low- avoiding supplement and calcium, pth was low. If persistent Endocrine advised cinacalcet. Corrected ca is 11.2. cont iv hydration. She may eventually need NHL treatment if persistent- will defer to her oncology.  Hypothyroidism : Continue her Synthroid  HTN: Stable on amlodipine  Mildy elevated troponin: ?etiology-suspect demand mismatch in the setting of #1, troponin 0 0.06->0.1->0.08  Downtrending, cycle trop. No chest pain. EKG-no overt signs of ischemia, Old anterior infarct. Consider limited echo. Last echo in 2016 555-60% lvef. Addendum: Patient has been coughing, and getting weaker and having lethargy per RN. I have ordered follow up ABG, ABG,labs, troponin,added steroid add pulmicort nebs. I signed out to night NP to follow up/monitor  I have updated her husband this evening and answered the questions.  DVT prophylaxis: lovenox Code Status: wishes to be full code Family Communication: discussed with patient in detail Disposition Plan: remains inpatient pending clinical improvement.  Consultants:  None  Procedures: CTA  Antimicrobials: Anti-infectives (From admission, onward)   Start     Dose/Rate Route Frequency Ordered Stop   06/28/18 1600  vancomycin (VANCOCIN) 1,250 mg in sodium chloride 0.9 % 250 mL IVPB     1,250 mg 166.7 mL/hr over 90 Minutes Intravenous Every 48 hours 06/26/18 2205     06/26/18 2215  ceFEPIme (MAXIPIME) 2 g in sodium chloride 0.9 % 100 mL IVPB     2 g 200 mL/hr over 30 Minutes Intravenous Every 12 hours 06/26/18 2153 07/04/18 1959   06/26/18 1330  vancomycin (VANCOCIN) IVPB 1000 mg/200 mL premix  1,000 mg 200 mL/hr over 60 Minutes Intravenous  Once 06/26/18 1327 06/26/18 1618   06/26/18 1330  piperacillin-tazobactam (ZOSYN) IVPB 3.375 g     3.375 g 12.5 mL/hr over 240 Minutes Intravenous  Once 06/26/18 1327 06/26/18 1455        Objective: Vitals:   06/27/18 0055 06/27/18 0620 06/27/18 0855 06/27/18 0926  BP: 137/67 122/67 (!) 148/66   Pulse: 81 77 79   Resp: (!) 24 (!) 22    Temp: 98.5 F (36.9 C) 98 F (36.7 C) 98.6 F (37 C)   TempSrc: Oral Oral Oral   SpO2: 95% 95% 99% 99%  Weight:  64.2 kg    Height:  5\' 1"  (1.549 m)      Intake/Output Summary (Last 24 hours) at 06/27/2018 1102 Last data filed at 06/27/2018 0900 Gross per 24 hour  Intake 1730.88 ml  Output 400 ml  Net 1330.88 ml   Filed Weights   06/26/18 1131 06/27/18 0620  Weight: 64 kg 64.2 kg   Weight change:   Body mass index is 26.74 kg/m.  Intake/Output from previous day: 04/24 0701 - 04/25 0700 In: 1490.9 [P.O.:240; I.V.:400.8; IV Piggyback:850.1] Out: 400 [Urine:400] Intake/Output this shift: Total I/O In: 240 [P.O.:240] Out: -   Examination:  General exam: Appears calm and comfortable,Not in distress, older fore the age HEENT:PERRL,Oral mucosa moist, Ear/Nose normal on gross exam Respiratory system: Bilateral equal air entry, normal vesicular breath sounds, no wheezes or crackles  Cardiovascular system: S1 & S2 heard,No JVD, murmurs. Gastrointestinal system: Abdomen is  soft, non tender, non distended, BS +  Nervous System:Alert and oriented. No focal neurological deficits/moving extremities, sensation intact. Extremities: No edema, no clubbing, distal peripheral pulses palpable. Skin: No rashes, lesions, no icterus MSK: Normal muscle bulk,tone ,power  Medications:  Scheduled Meds: . amLODipine  5 mg Oral Q breakfast  . buPROPion  150 mg Oral Daily  . enoxaparin (LOVENOX) injection  40 mg Subcutaneous Q24H  . famotidine  20 mg Oral QHS  . feeding supplement (ENSURE ENLIVE)  237 mL Oral BID BM  . ipratropium-albuterol  3 mL Nebulization TID  . levothyroxine  125 mcg Oral Q0600  . mouth rinse  15 mL Mouth Rinse BID  . pantoprazole  40 mg Oral Daily  . potassium chloride  20 mEq Oral Daily   Continuous Infusions:  . sodium chloride Stopped (06/26/18 1343)  . sodium chloride 100 mL/hr at 06/27/18 0400  . ceFEPime (MAXIPIME) IV 2 g (06/27/18 1026)  . [START ON 06/28/2018] vancomycin      Data Reviewed: I have personally reviewed following labs and imaging studies  CBC: Recent Labs  Lab 06/26/18 1147 06/26/18 2234  WBC 8.1 7.4  NEUTROABS 5.0  --   HGB 11.6* 11.0*  HCT 36.1 32.7*  MCV 80.9 78.4*  PLT 247 017   Basic Metabolic Panel: Recent Labs  Lab 06/26/18 1147 06/26/18 2234 06/27/18 0450  NA 132*  --  136  K 3.4*  --  3.9  CL 96*  --  101  CO2 24  --  23  GLUCOSE 99  --  87  BUN 15  --  13  CREATININE 1.21* 1.27* 1.32*  CALCIUM 11.0*  --  10.2   GFR: Estimated Creatinine Clearance: 29.7 mL/min (A) (by C-G formula based on SCr of 1.32 mg/dL (H)). Liver Function Tests: Recent Labs  Lab 06/26/18 1147 06/27/18 0450  AST 28 22  ALT 15 13  ALKPHOS 72 61  BILITOT  0.8 0.7  PROT 6.6 5.0*  ALBUMIN 3.6 2.8*   No results for input(s): LIPASE, AMYLASE in the last 168 hours. No results for input(s): AMMONIA in the last 168 hours. Coagulation Profile: No results for input(s): INR, PROTIME in the last 168 hours. Cardiac Enzymes: Recent Labs  Lab 06/26/18 1147 06/26/18 1534 06/27/18 0803  TROPONINI 0.06* 0.10* 0.08*   BNP (last 3 results) No results for input(s): PROBNP in the last 8760 hours. HbA1C: No results for input(s): HGBA1C in the last 72 hours. CBG: No results for input(s): GLUCAP in the last 168 hours. Lipid Profile: No results for input(s): CHOL, HDL, LDLCALC, TRIG, CHOLHDL, LDLDIRECT in the last 72 hours. Thyroid Function Tests: No results for input(s): TSH, T4TOTAL, FREET4, T3FREE, THYROIDAB in the last 72 hours. Anemia Panel: No results for input(s): VITAMINB12, FOLATE, FERRITIN, TIBC, IRON, RETICCTPCT in the last 72 hours. Sepsis Labs: No results for input(s): PROCALCITON, LATICACIDVEN in the last 168 hours.  Recent Results (from the past 240 hour(s))   SARS Coronavirus 2 [facility] order, Performed in [place] hospital lab)     Status: None   Collection Time: 06/26/18 12:06 PM  Result Value Ref Range Status   SARS Coronavirus 2 NEGATIVE NEGATIVE Final    Comment: (NOTE) If result is NEGATIVE SARS-CoV-2 target nucleic acids are NOT DETECTED. The SARS-CoV-2 RNA is generally detectable in upper and lower  respiratory specimens during the acute phase of infection. The lowest  concentration of SARS-CoV-2 viral copies this assay can detect is 250  copies / mL. A negative result does not preclude SARS-CoV-2 infection  and should not be used as the sole basis for treatment or other  patient management decisions.  A negative result may occur with  improper specimen collection / handling, submission of specimen other  than nasopharyngeal swab, presence of viral mutation(s) within the  areas targeted by this assay, and inadequate number of viral copies  (<250 copies / mL). A negative result must be combined with clinical  observations, patient history, and epidemiological information. If result is POSITIVE SARS-CoV-2 target nucleic acids are DETECTED. The SARS-CoV-2 RNA is generally detectable in upper and lower  respiratory specimens dur ing the acute phase of infection.  Positive  results are indicative of active infection with SARS-CoV-2.  Clinical  correlation with patient history and other diagnostic information is  necessary to determine patient infection status.  Positive results do  not rule out bacterial infection or co-infection with other viruses. If result is PRESUMPTIVE POSTIVE SARS-CoV-2 nucleic acids MAY BE PRESENT.   A presumptive positive result was obtained on the submitted specimen  and confirmed on repeat testing.  While 2019 novel coronavirus  (SARS-CoV-2) nucleic acids may be present in the submitted sample  additional confirmatory testing may be necessary for epidemiological  and / or clinical management purposes  to  differentiate between  SARS-CoV-2 and other Sarbecovirus currently known to infect humans.  If clinically indicated additional testing with an alternate test  methodology [id number]) is advised. The SARS-CoV-2 RNA is generally  detectable in upper and lower respiratory sp ecimens during the acute  phase of infection. The expected result is Negative. Fact Sheet for Patients:  StrictlyIdeas.no Fact Sheet for Healthcare Providers: BankingDealers.co.za This test is not yet approved or cleared by the [place] FDA and has been authorized for detection and/or diagnosis of SARS-CoV-2 by FDA under an Emergency Use Authorization (EUA).  This EUA will remain in effect (meaning this test can be used) for  the duration of the COVID-19 declaration under Section 564(b)(1) of the Act, 21 U.S.C. section 360bbb-3(b)(1), unless the authorization is terminated or revoked sooner. Performed at [facility], [place] [address], [place], [place] [address]       Radiology Studies: Ct Angio Chest Pe W And/or Wo Contrast  Result Date: 06/26/2018 CLINICAL DATA:  Shortness of breath with cough and elevated D-dimer levels. Symptoms for several weeks. Recent diagnosis of lymphoma. EXAM: CT ANGIOGRAPHY CHEST WITH CONTRAST TECHNIQUE: Multidetector CT imaging of the chest was performed using the standard protocol during bolus administration of intravenous contrast. Multiplanar CT image reconstructions and MIPs were obtained to evaluate the vascular anatomy. CONTRAST:  56mL OMNIPAQUE IOHEXOL 350 MG/ML SOLN COMPARISON:  Radiographs today, PET-CT 05/29/2018 and chest CT 05/19/2018. FINDINGS: Cardiovascular: The pulmonary arteries are well opacified with contrast to the level of the subsegmental branches. There is no evidence of acute pulmonary embolism. Stable mild atherosclerosis of the aorta, great vessels and coronary arteries. The heart is mildly enlarged. No significant  pericardial effusion. Mediastinum/Nodes: Again demonstrated is extensive supraclavicular, mediastinal, hilar and axillary lymphadenopathy bilaterally. Some of these nodes have enlarged compared with the previous study, including a 1.7 cm right axillary node on image 27/6 and a 1.9 cm left axillary node on image 40/6. 2.3 cm AP window node on image 30/6 and 2.5 cm subcarinal node on image 50/6 are similar to the previous study. The thyroid gland, trachea and esophagus demonstrate no significant findings. Lungs/Pleura: There are enlarging, moderate size dependent bilateral pleural effusions. There is worsening aeration of the lungs. Chronic collapse and bronchiectasis in the right middle lobe and lingula have progressed. There is new partial collapse and opacification of both lower lobes. Underlying nodularity within the aerated portions of the lungs is grossly stable. Upper abdomen: There are multiple mildly enlarged lymph nodes within the porta hepatis and upper retroperitoneum. Stable mild splenomegaly without focal abnormality. There is mild reflux of contrast into the hepatic vein and IVC. Musculoskeletal/Chest wall: There is no chest wall mass or suspicious osseous finding. Review of the MIP images confirms the above findings. IMPRESSION: 1. No evidence of acute pulmonary embolism. 2. Extensive thoracic adenopathy, mildly progressive from previous CT of last month, consistent with slight worsening of lymphoma. Splenomegaly and upper abdominal adenopathy are grossly stable. 3. Enlarging bilateral pleural effusions with worsening airspace opacities in both lower lobes, the right middle lobe and lingula. Underlying pulmonary nodularity is grossly stable. Pattern is nonspecific and could be secondary to superimposed atelectasis, infection or edema. Electronically Signed   By: Richardean Sale M.D.   On: 06/26/2018 17:37   Dg Chest Port 1 View  Result Date: 06/26/2018 CLINICAL DATA:  Cough, shortness of breath  EXAM: PORTABLE CHEST 1 VIEW COMPARISON:  06/16/2018 FINDINGS: Bilateral mild interstitial thickening increased from the prior exam. Bilateral small pleural effusions. No pneumothorax. Stable cardiomediastinal silhouette. No acute osseous abnormality. IMPRESSION: 1. Bilateral interstitial thickening primarily at the lung bases and small pleural effusions. Differential considerations include pulmonary edema versus pneumonia. Electronically Signed   By: Kathreen Devoid   On: 06/26/2018 12:45      LOS: 1 day   Time spent: More than 50% of that time was spent in counseling and/or coordination of care.  Antonieta Pert, MD [facility]  06/27/2018, 11:02 AM

## 2018-06-28 LAB — BASIC METABOLIC PANEL
Anion gap: 12 (ref 5–15)
BUN: 12 mg/dL (ref 8–23)
CO2: 20 mmol/L — ABNORMAL LOW (ref 22–32)
Calcium: 10 mg/dL (ref 8.9–10.3)
Chloride: 105 mmol/L (ref 98–111)
Creatinine, Ser: 1.1 mg/dL — ABNORMAL HIGH (ref 0.44–1.00)
GFR calc Af Amer: 55 mL/min — ABNORMAL LOW (ref >60)
GFR calc non Af Amer: 48 mL/min — ABNORMAL LOW (ref >60)
Glucose, Bld: 148 mg/dL — ABNORMAL HIGH (ref 70–99)
Potassium: 3.8 mmol/L (ref 3.5–5.1)
Sodium: 137 mmol/L (ref 135–145)

## 2018-06-28 LAB — CBC
HCT: 30.7 % — ABNORMAL LOW (ref 36.0–46.0)
Hemoglobin: 10.2 g/dL — ABNORMAL LOW (ref 12.0–15.0)
MCH: 26.2 pg (ref 26.0–34.0)
MCHC: 33.2 g/dL (ref 30.0–36.0)
MCV: 78.9 fL — ABNORMAL LOW (ref 80.0–100.0)
Platelets: 234 10*3/uL (ref 150–400)
RBC: 3.89 MIL/uL (ref 3.87–5.11)
RDW: 15.6 % — ABNORMAL HIGH (ref 11.5–15.5)
WBC: 5.3 10*3/uL (ref 4.0–10.5)
nRBC: 0 % (ref 0.0–0.2)

## 2018-06-28 LAB — STREP PNEUMONIAE URINARY ANTIGEN: Strep Pneumo Urinary Antigen: NEGATIVE

## 2018-06-28 MED ORDER — DOCUSATE SODIUM 100 MG PO CAPS
100.0000 mg | ORAL_CAPSULE | Freq: Two times a day (BID) | ORAL | Status: DC
  Administered 2018-06-28 – 2018-07-01 (×7): 100 mg via ORAL
  Filled 2018-06-28 (×7): qty 1

## 2018-06-28 MED ORDER — VANCOMYCIN HCL IN DEXTROSE 1-5 GM/200ML-% IV SOLN
1000.0000 mg | INTRAVENOUS | Status: DC
  Administered 2018-06-28 – 2018-06-29 (×2): 1000 mg via INTRAVENOUS
  Filled 2018-06-28 (×2): qty 200

## 2018-06-28 MED ORDER — POLYETHYLENE GLYCOL 3350 17 G PO PACK: 17.0000 g | PACK | Freq: Every day | ORAL | Status: DC | PRN

## 2018-06-28 MED ORDER — ASPIRIN EC 81 MG PO TBEC
81.0000 mg | DELAYED_RELEASE_TABLET | Freq: Every day | ORAL | Status: DC
  Administered 2018-06-28 – 2018-07-01 (×4): 81 mg via ORAL
  Filled 2018-06-28 (×4): qty 1

## 2018-06-28 MED ORDER — IPRATROPIUM-ALBUTEROL 0.5-2.5 (3) MG/3ML IN SOLN
3.0000 mL | Freq: Two times a day (BID) | RESPIRATORY_TRACT | Status: DC
  Administered 2018-06-28 – 2018-07-01 (×6): 3 mL via RESPIRATORY_TRACT
  Filled 2018-06-28 (×6): qty 3

## 2018-06-28 NOTE — Progress Notes (Signed)
PROGRESS NOTE    Amanda [name]  GYI:[id number] DOB: Jan 28, 1941 DOA: 06/26/2018 PCP: [name], DO   Brief Narrative: [phone] w/ Hx stage IV NHLrecent ADMIT for low grade fever- neg covid, aki, hypercalcemia (06/16/2018 to  06/20/2018), p/w to [facility] with shortness of breath and new oxygen requirement., DOE and at rest, cough but no fever/no wheezing, no sick contacts.Seen by oncologist likely not from [place]. Now in  ER w Rf 39 oxygen sat 88%  RA, WBC 8.1 creat 1.21.  Calcium is 11.0.  Troponin is 0.1 and glucose 99.  Urinalysis is negative.  Chest x-ray showed bilateral basal thickening with some pleural effusion.  BNP 171.  CTA NO PE but Extensive thoracic adenopathy mildly progressive from previous  CT last month.Worsening of lymphoma.  Splenomegaly and upper abdomen adenopathy are grossly stable.  Enlarging bilateral pleural effusions with worsening airspace opacities in both lower lobes the right middle lobe and lingula. Patern is  nonspecific but could be seen in the secondary to superimposed atelectasis,infection or edema. COVID-19 testing was negative. Pt was started on broad spectrumfor antibiotics and was admitted.  Subjective: Patient is clinically feeling better.  Complains of intermittent cough, shortness of breath is overall better, denies chest pain nausea vomiting.  Some discomfort on the left upper abdomen.  Eating food. On 2 L nasal cannula.  Overnight transferred to progressive [place] given lethargy/weakness with persistent cough  Assessment & Plan:   Air space opacity noted in the CT scan with bilateral pleural effusions, possible HCAP, continue on empiric antibiotics-vanco/cefepime for now.  Has been afebrile, WBC count 5.3, COVID 19 neg. blood culture negative so far. Continue pulmonary toileting.  Shortness of breath/dyspnea on exertion with acute respiratory failure with hypoxia due to #1, history of bronchiectasis, also wheezing. no PE. Patient remains  tachypneicContinue supplemental oxygen, aggressive bronchodilators, steroids, antitussives. Followed by pulmonary as o/p for bronchiectasis.  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 Given her worsening adenopathy, will ask oncology consult, patient and husband also requesting to reevaluate.  Spoke with Dr. Ammie Dalton from oncology and he will have Dr. Maylon Peppers patient's primary oncologist see her tomorrow  Hypercalcemia, in the setting of NHL , recently seen by endocrinology Dr Loanne Drilling 4/23, 25 vit d was low- avoiding supplement and calcium, pth was low. If persistent Endocrine advised cinacalcet. Corrected ca is 11.2. cont gentle iv hydration.  Will ask oncology to evaluate.  Hypothyroidism : Continue her Synthroid  HTN: Stable on amlodipine  Mildy elevated troponin:suspect demand mismatch in the setting of #1, troponin 0 0.06->0.1->0.08>0.07   Patient denied chest pain. EKG-no overt signs of ischemia, Old anterior infarct. obtaiend limited echo has normal EF with no acute finding.  DVT prophylaxis: lovenox Code Status: wishes to be full code Family Communication: discussed with patient in detail.  Discussed with patient's husband over the phone and updated. Disposition Plan: remains inpatient pending clinical improvement.  Consultants:  None  Procedures: 4/25 Limited echo Technically difficult  1. The left ventricle has normal systolic function, with an ejection fraction of 60-65%. The cavity size was normal. There is moderately increased left ventricular wall thickness.  2. The right ventricle has normal systolc function. The cavity was normal. There is no increase in right ventricular wall thickness.  3. Large pleural effusion.  CTA Extensive thoracic adenopathy mildly progressive from previous  CT last month.Worsening of lymphoma.  Splenomegaly and upper abdomen adenopathy are grossly stable.  Enlarging bilateral pleural effusions with worsening airspace opacities  in both lower lobes the right middle lobe and lingula. Patern is  nonspecific but could be seen in the secondary to superimposed atelectasis,infection or edema.  Antimicrobials: Anti-infectives (From admission, onward)   Start     Dose/Rate Route Frequency Ordered Stop   06/28/18 1600  vancomycin (VANCOCIN) 1,250 mg in sodium chloride 0.9 % 250 mL IVPB     1,250 mg 166.7 mL/hr over 90 Minutes Intravenous Every 48 hours 06/26/18 2205     06/26/18 2215  ceFEPIme (MAXIPIME) 2 g in sodium chloride 0.9 % 100 mL IVPB     2 g 200 mL/hr over 30 Minutes Intravenous Every 12 hours 06/26/18 2153 07/04/18 1959   06/26/18 1330  vancomycin (VANCOCIN) IVPB 1000 mg/200 mL premix     1,000 mg 200 mL/hr over 60 Minutes Intravenous  Once 06/26/18 1327 06/26/18 1618   06/26/18 1330  piperacillin-tazobactam (ZOSYN) IVPB 3.375 g     3.375 g 12.5 mL/hr over 240 Minutes Intravenous  Once 06/26/18 1327 06/26/18 1455       Objective: Vitals:   06/27/18 2045 06/28/18 0611 06/28/18 0738 06/28/18 0743  BP:  134/73  137/67  Pulse:    90  Resp:  (!) 28  (!) 28  Temp:  (!) 97.4 F (36.3 C)    TempSrc:  Oral    SpO2: 99%  95% 96%  Weight:      Height:        Intake/Output Summary (Last 24 hours) at 06/28/2018 1000 Last data filed at 06/28/2018 0801 Gross per 24 hour  Intake 100 ml  Output 2200 ml  Net -2100 ml   Filed Weights   06/26/18 1131 06/27/18 0620  Weight: 64 kg 64.2 kg   Weight change:   Body mass index is 26.74 kg/m.  Intake/Output from previous day: 04/25 0701 - 04/26 0700 In: 340 [P.O.:240; IV Piggyback:100] Out: 1600 [Urine:1600] Intake/Output this shift: Total I/O In: -  Out: 600 [Urine:600]  Examination: General exam: Calm, comfortable, not in acute distress, older for age, average built.  HEENT:Oral mucosa moist, Ear/Nose WNL grossly, dentition normal. Respiratory system: Bilateral air entry present, clear coughing has expiratory wheezing, no use of accessory muscle, non  tender on palpation. Cardiovascular system: regular rate and rhythm, S1 & S2 heard, No JVD/murmurs. Gastrointestinal system: Abdomen soft, non-tender, non-distended, BS +. No hepatosplenomegaly palpable. Nervous System:Alert, awake and oriented at baseline. Able to move UE and LE, sensation intact. Extremities: No edema, distal peripheral pulses palpable.  Skin: No rashes,no icterus. MSK: Normal muscle bulk,tone, power  Medications:  Scheduled Meds:  amLODipine  5 mg Oral Q breakfast   budesonide (PULMICORT) nebulizer solution  0.25 mg Nebulization BID   buPROPion  150 mg Oral Daily   enoxaparin (LOVENOX) injection  40 mg Subcutaneous Q24H   famotidine  20 mg Oral QHS   feeding supplement (ENSURE ENLIVE)  237 mL Oral BID BM   ipratropium-albuterol  3 mL Nebulization BID   levothyroxine  125 mcg Oral Q0600   mouth rinse  15 mL Mouth Rinse BID   methylPREDNISolone (SOLU-MEDROL) injection  40 mg Intravenous Q8H   pantoprazole  40 mg Oral Daily   potassium chloride  20 mEq Oral Daily   Continuous Infusions:  sodium chloride Stopped (06/26/18 1343)   sodium chloride 75 mL/hr at 06/27/18 1904   ceFEPime (MAXIPIME) IV 2 g (06/27/18 2119)   vancomycin      Data Reviewed: I have personally reviewed following labs and imaging studies  CBC:  Recent Labs  Lab 06/26/18 1147 06/26/18 2234 06/28/18 0313  WBC 8.1 7.4 5.3  NEUTROABS 5.0  --   --   HGB 11.6* 11.0* 10.2*  HCT 36.1 32.7* 30.7*  MCV 80.9 78.4* 78.9*  PLT 247 237 976   Basic Metabolic Panel: Recent Labs  Lab 06/26/18 1147 06/26/18 2234 06/27/18 0450 06/27/18 1856 06/28/18 0313  NA 132*  --  136 137 137  K 3.4*  --  3.9 3.7 3.8  CL 96*  --  101 105 105  CO2 24  --  23 20* 20*  GLUCOSE 99  --  87 110* 148*  BUN 15  --  13 12 12   CREATININE 1.21* 1.27* 1.32* 1.12* 1.10*  CALCIUM 11.0*  --  10.2 10.2 10.0   GFR: Estimated Creatinine Clearance: 35.6 mL/min (A) (by C-G formula based on SCr of 1.1  mg/dL (H)). Liver Function Tests: Recent Labs  Lab 06/26/18 1147 06/27/18 0450 06/27/18 1856  AST 28 22 28   ALT 15 13 17   ALKPHOS 72 61 65  BILITOT 0.8 0.7 0.7  PROT 6.6 5.0* 5.4*  ALBUMIN 3.6 2.8* 2.9*   No results for input(s): LIPASE, AMYLASE in the last 168 hours. No results for input(s): AMMONIA in the last 168 hours. Coagulation Profile: No results for input(s): INR, PROTIME in the last 168 hours. Cardiac Enzymes: Recent Labs  Lab 06/26/18 1534 06/27/18 0803 06/27/18 1027 06/27/18 1344 06/27/18 1856  TROPONINI 0.10* 0.08* 0.07* 0.07* 0.07*   BNP (last 3 results) No results for input(s): PROBNP in the last 8760 hours. HbA1C: No results for input(s): HGBA1C in the last 72 hours. CBG: No results for input(s): GLUCAP in the last 168 hours. Lipid Profile: No results for input(s): CHOL, HDL, LDLCALC, TRIG, CHOLHDL, LDLDIRECT in the last 72 hours. Thyroid Function Tests: No results for input(s): TSH, T4TOTAL, FREET4, T3FREE, THYROIDAB in the last 72 hours. Anemia Panel: No results for input(s): VITAMINB12, FOLATE, FERRITIN, TIBC, IRON, RETICCTPCT in the last 72 hours. Sepsis Labs: No results for input(s): PROCALCITON, LATICACIDVEN in the last 168 hours.  Recent Results (from the past 240 hour(s))  SARS Coronavirus 2 [facility] order, Performed in [place] hospital lab)     Status: None   Collection Time: 06/26/18 12:06 PM  Result Value Ref Range Status   SARS Coronavirus 2 NEGATIVE NEGATIVE Final    Comment: (NOTE) If result is NEGATIVE SARS-CoV-2 target nucleic acids are NOT DETECTED. The SARS-CoV-2 RNA is generally detectable in upper and lower  respiratory specimens during the acute phase of infection. The lowest  concentration of SARS-CoV-2 viral copies this assay can detect is 250  copies / mL. A negative result does not preclude SARS-CoV-2 infection  and should not be used as the sole basis for treatment or other  patient management decisions.  A  negative result may occur with  improper specimen collection / handling, submission of specimen other  than nasopharyngeal swab, presence of viral mutation(s) within the  areas targeted by this assay, and inadequate number of viral copies  (<250 copies / mL). A negative result must be combined with clinical  observations, patient history, and epidemiological information. If result is POSITIVE SARS-CoV-2 target nucleic acids are DETECTED. The SARS-CoV-2 RNA is generally detectable in upper and lower  respiratory specimens dur ing the acute phase of infection.  Positive  results are indicative of active infection with SARS-CoV-2.  Clinical  correlation with patient history and other diagnostic information is  necessary to determine  patient infection status.  Positive results do  not rule out bacterial infection or co-infection with other viruses. If result is PRESUMPTIVE POSTIVE SARS-CoV-2 nucleic acids MAY BE PRESENT.   A presumptive positive result was obtained on the submitted specimen  and confirmed on repeat testing.  While 2019 novel coronavirus  (SARS-CoV-2) nucleic acids may be present in the submitted sample  additional confirmatory testing may be necessary for epidemiological  and / or clinical management purposes  to differentiate between  SARS-CoV-2 and other Sarbecovirus currently known to infect humans.  If clinically indicated additional testing with an alternate test  methodology [phone]) is advised. The SARS-CoV-2 RNA is generally  detectable in upper and lower respiratory sp ecimens during the acute  phase of infection. The expected result is Negative. Fact Sheet for Patients:  StrictlyIdeas.no Fact Sheet for Healthcare Providers: BankingDealers.co.za This test is not yet approved or cleared by the [place] FDA and has been authorized for detection and/or diagnosis of SARS-CoV-2 by FDA under an Emergency Use  Authorization (EUA).  This EUA will remain in effect (meaning this test can be used) for the duration of the COVID-19 declaration under Section 564(b)(1) of the Act, 21 U.S.C. section 360bbb-3(b)(1), unless the authorization is terminated or revoked sooner. Performed at [facility], [place] [address] [place], [place] [address]   Culture, blood (routine x 2) Call MD if unable to obtain prior to antibiotics being given     Status: None (Preliminary result)   Collection Time: 06/26/18 10:34 PM  Result Value Ref Range Status   Specimen Description BLOOD LEFT ARM  Final   Special Requests   Final    BOTTLES DRAWN AEROBIC ONLY Blood Culture adequate volume   Culture   Final    NO GROWTH 2 DAYS Performed at [facility], [place] [address] [place], [place] [address]    Report Status PENDING  Incomplete  Culture, blood (routine x 2) Call MD if unable to obtain prior to antibiotics being given     Status: None (Preliminary result)   Collection Time: 06/26/18 10:38 PM  Result Value Ref Range Status   Specimen Description BLOOD LEFT HAND  Final   Special Requests   Final    BOTTLES DRAWN AEROBIC AND ANAEROBIC Blood Culture adequate volume   Culture   Final    NO GROWTH 2 DAYS Performed at [facility], [place] [address], [place], [place] [address]    Report Status PENDING  Incomplete      Radiology Studies: Ct Angio Chest Pe W And/or Wo Contrast  Result Date: 06/26/2018 CLINICAL DATA:  Shortness of breath with cough and elevated D-dimer levels. Symptoms for several weeks. Recent diagnosis of lymphoma. EXAM: CT ANGIOGRAPHY CHEST WITH CONTRAST TECHNIQUE: Multidetector CT imaging of the chest was performed using the standard protocol during bolus administration of intravenous contrast. Multiplanar CT image reconstructions and MIPs were obtained to evaluate the vascular anatomy. CONTRAST:  52mL OMNIPAQUE IOHEXOL 350 MG/ML SOLN COMPARISON:  Radiographs today, PET-CT 05/29/2018 and  chest CT 05/19/2018. FINDINGS: Cardiovascular: The pulmonary arteries are well opacified with contrast to the level of the subsegmental branches. There is no evidence of acute pulmonary embolism. Stable mild atherosclerosis of the aorta, great vessels and coronary arteries. The heart is mildly enlarged. No significant pericardial effusion. Mediastinum/Nodes: Again demonstrated is extensive supraclavicular, mediastinal, hilar and axillary lymphadenopathy bilaterally. Some of these nodes have enlarged compared with the previous study, including a 1.7 cm right axillary node  on image 27/6 and a 1.9 cm left axillary node on image 40/6. 2.3 cm AP window node on image 30/6 and 2.5 cm subcarinal node on image 50/6 are similar to the previous study. The thyroid gland, trachea and esophagus demonstrate no significant findings. Lungs/Pleura: There are enlarging, moderate size dependent bilateral pleural effusions. There is worsening aeration of the lungs. Chronic collapse and bronchiectasis in the right middle lobe and lingula have progressed. There is new partial collapse and opacification of both lower lobes. Underlying nodularity within the aerated portions of the lungs is grossly stable. Upper abdomen: There are multiple mildly enlarged lymph nodes within the porta hepatis and upper retroperitoneum. Stable mild splenomegaly without focal abnormality. There is mild reflux of contrast into the hepatic vein and IVC. Musculoskeletal/Chest wall: There is no chest wall mass or suspicious osseous finding. Review of the MIP images confirms the above findings. IMPRESSION: 1. No evidence of acute pulmonary embolism. 2. Extensive thoracic adenopathy, mildly progressive from previous CT of last month, consistent with slight worsening of lymphoma. Splenomegaly and upper abdominal adenopathy are grossly stable. 3. Enlarging bilateral pleural effusions with worsening airspace opacities in both lower lobes, the right middle lobe and  lingula. Underlying pulmonary nodularity is grossly stable. Pattern is nonspecific and could be secondary to superimposed atelectasis, infection or edema. Electronically Signed   By: Richardean Sale M.D.   On: 06/26/2018 17:37   Dg Chest Port 1 View  Result Date: 06/27/2018 CLINICAL DATA:  Current history of lymphoma with shortness of breath and cough. Follow-up effusions and possible pneumonia. EXAM: PORTABLE CHEST 1 VIEW COMPARISON:  CTA chest yesterday. Chest x-rays yesterday and earlier. FINDINGS: Cardiac silhouette mildly enlarged, unchanged. Thoracic aorta ectatic and atherosclerotic, unchanged. Since yesterday, no change in the moderately large BILATERAL pleural effusions, LEFT greater than RIGHT, and the airspace consolidation in the BILATERAL LOWER LOBES, RIGHT MIDDLE LOBE and lingula. Pulmonary vascularity normal. 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 IMPRESSION: 1. Stable moderately large BILATERAL pleural effusions, LEFT greater than RIGHT, and associated passive atelectasis and/or pneumonia in the BILATERAL LOWER LOBES, RIGHT MIDDLE LOBE and lingula. 2. Interstitial opacities throughout both lungs, unchanged since yesterday's examinations but new since 06/16/2018, query lymphangitic carcinomatosis. Interstitial edema is felt less likely since pulmonary vascularity is normal. Electronically Signed   By: Evangeline Dakin M.D.   On: 06/27/2018 19:11   Dg Chest Port 1 View  Result Date: 06/26/2018 CLINICAL DATA:  Cough, shortness of breath EXAM: PORTABLE CHEST 1 VIEW COMPARISON:  06/16/2018 FINDINGS: Bilateral mild interstitial thickening increased from the prior exam. Bilateral small pleural effusions. No pneumothorax. Stable cardiomediastinal silhouette. No acute osseous abnormality.  IMPRESSION: 1. Bilateral interstitial thickening primarily at the lung bases and small pleural effusions. Differential considerations include pulmonary edema versus pneumonia. Electronically Signed   By: Kathreen Devoid   On: 06/26/2018 12:45      LOS: 2 days   Time spent: More than 50% of that time was spent in counseling and/or coordination of care.  Antonieta Pert, MD [facility]  06/28/2018, 10:00 AM

## 2018-06-28 NOTE — Progress Notes (Signed)
Pharmacy Antibiotic Note  Amanda [name] is a 78 y.o. female admitted on 06/26/2018 with pneumonia. Pharmacy has been consulted for Vancomycin and Cefepime dosing.  SCr down to 1.1 with good UOP recorded at 1 mL/kg/hr.  Rapid response called 4/25 due to increase SOB ant O2 sats 94%. Steroids were added at that time. WBC continues to be within normal limits. Patient is afebrile.  Plan: Change Vancomycin to 1000 mg IV Q 36 hrs. Goal AUC 400-550. Expected AUC: 475 SCr used: 1.1 Continue Cefepime 2g IV q12h Monitor clinical progress, c/s, renal function F/u de-escalation plan/LOT, vancomycin trough as indicated   Height: 5\' 1"  (154.9 cm) Weight: 141 lb 8 oz (64.2 kg) IBW/kg (Calculated) : 47.8  Temp (24hrs), Avg:98 F (36.7 C), Min:97.4 F (36.3 C), Max:98.4 F (36.9 C)  Recent Labs  Lab 06/26/18 1147 06/26/18 2234 06/27/18 0450 06/27/18 1856 06/28/18 0313  WBC 8.1 7.4  --   --  5.3  CREATININE 1.21* 1.27* 1.32* 1.12* 1.10*    Estimated Creatinine Clearance: 35.6 mL/min (A) (by C-G formula based on SCr of 1.1 mg/dL (H)).    Allergies  Allergen Reactions  . Phenergan [Promethazine Hcl] Other (See Comments)    Jerking/agitation  . Levofloxacin Nausea And Vomiting  . Pravastatin Sodium Nausea And Vomiting    Sloan Leiter, PharmD, BCPS, BCCCP Clinical Pharmacist Please refer to [facility] for [place] numbers 06/28/2018 10:24 AM

## 2018-06-28 NOTE — Evaluation (Signed)
Physical Therapy Evaluation Patient Details Name: Amanda [name] MRN: [id number] DOB: 09-Dec-1940 Today's Date: 06/28/2018   History of Present Illness  Pt is a 78 y.o. F with significant PMH of stage IV non Hodgkins lymphoma who presents with shortness of breath. Working diagnosis of HCAP  Clinical Impression  Pt admitted with above diagnosis. Pt currently with functional limitations due to the deficits listed below (see PT Problem List). Pt presenting with decreased endurance. Ambulating 60 feet with no physical assistance, SpO2 96% on 2L O2, DOE 2/4. Suspect pt will progress well. Pt will benefit from skilled PT to increase their independence and safety with mobility to allow discharge to the venue listed below.       Follow Up Recommendations No PT follow up    Equipment Recommendations  None recommended by PT    Recommendations for Other Services       Precautions / Restrictions Precautions Precautions: Fall Restrictions Weight Bearing Restrictions: No      Mobility  Bed Mobility Overal bed mobility: Modified Independent                Transfers Overall transfer level: Needs assistance Equipment used: None Transfers: Sit to/from Stand Sit to Stand: Supervision            Ambulation/Gait Ambulation/Gait assistance: Supervision Gait Distance (Feet): 60 Feet Assistive device: None Gait Pattern/deviations: Step-through pattern;Decreased stride length Gait velocity: decreased   General Gait Details: no gross unsteadiness  Stairs            Wheelchair Mobility    Modified Rankin (Stroke Patients Only)       Balance Overall balance assessment: Mild deficits observed, not formally tested                                           Pertinent Vitals/Pain Pain Assessment: No/denies pain    Home Living Family/patient expects to be discharged to:: Private residence Living Arrangements: Spouse/significant other Available Help at  Discharge: Available 24 hours/day Type of Home: House Home Access: Stairs to enter Entrance Stairs-Rails: Psychiatric nurse of Steps: 4 Home Layout: One level Home Equipment: Environmental consultant - 4 wheels      Prior Function Level of Independence: Independent with assistive device(s)         Comments: using Rollator recently     Hand Dominance        Extremity/Trunk Assessment   Upper Extremity Assessment Upper Extremity Assessment: Overall WFL for tasks assessed    Lower Extremity Assessment Lower Extremity Assessment: Overall WFL for tasks assessed    Cervical / Trunk Assessment Cervical / Trunk Assessment: Normal  Communication   Communication: No difficulties  Cognition Arousal/Alertness: Awake/alert Behavior During Therapy: WFL for tasks assessed/performed Overall Cognitive Status: Within Functional Limits for tasks assessed                                        General Comments      Exercises     Assessment/Plan    PT Assessment Patient needs continued PT services  PT Problem List Decreased strength;Decreased activity tolerance;Decreased mobility;Decreased balance       PT Treatment Interventions Gait training;Functional mobility training;Therapeutic activities    PT Goals (Current goals can be found in the Care Plan section)  Acute  Rehab PT Goals Patient Stated Goal: regain strength PT Goal Formulation: With patient Time For Goal Achievement: 07/12/18 Potential to Achieve Goals: Good    Frequency Min 3X/week   Barriers to discharge        Co-evaluation               AM-PAC PT "6 Clicks" Mobility  Outcome Measure Help needed turning from your back to your side while in a flat bed without using bedrails?: None Help needed moving from lying on your back to sitting on the side of a flat bed without using bedrails?: None Help needed moving to and from a bed to a chair (including a wheelchair)?: None Help needed  standing up from a chair using your arms (e.g., wheelchair or bedside chair)?: None Help needed to walk in hospital room?: None Help needed climbing 3-5 steps with a railing? : A Little 6 Click Score: 23    End of Session Equipment Utilized During Treatment: Oxygen Activity Tolerance: Patient tolerated treatment well Patient left: in chair;with call bell/phone within reach;with chair alarm set Nurse Communication: Mobility status PT Visit Diagnosis: Unsteadiness on feet (R26.81)    Time: 9030-0923 PT Time Calculation (min) (ACUTE ONLY): 16 min   Charges:   PT Evaluation $PT Eval Moderate Complexity: 1 Mod        Ellamae Sia, PT, DPT Acute Rehabilitation Services Pager [phone] Office [phone]   Willy Eddy 06/28/2018, 10:58 AM

## 2018-06-29 ENCOUNTER — Inpatient Hospital Stay: Payer: Medicare Other | Admitting: Family Medicine

## 2018-06-29 ENCOUNTER — Telehealth: Payer: Self-pay | Admitting: *Deleted

## 2018-06-29 ENCOUNTER — Encounter: Payer: Self-pay | Admitting: *Deleted

## 2018-06-29 DIAGNOSIS — R0602 Shortness of breath: Secondary | ICD-10-CM

## 2018-06-29 DIAGNOSIS — R4789 Other speech disturbances: Secondary | ICD-10-CM

## 2018-06-29 DIAGNOSIS — J479 Bronchiectasis, uncomplicated: Secondary | ICD-10-CM

## 2018-06-29 DIAGNOSIS — J90 Pleural effusion, not elsewhere classified: Secondary | ICD-10-CM

## 2018-06-29 LAB — LEGIONELLA PNEUMOPHILA SEROGP 1 UR AG: L. pneumophila Serogp 1 Ur Ag: NEGATIVE

## 2018-06-29 LAB — BASIC METABOLIC PANEL
Anion gap: 9 (ref 5–15)
BUN: 20 mg/dL (ref 8–23)
CO2: 21 mmol/L — ABNORMAL LOW (ref 22–32)
Calcium: 10.3 mg/dL (ref 8.9–10.3)
Chloride: 110 mmol/L (ref 98–111)
Creatinine, Ser: 1.07 mg/dL — ABNORMAL HIGH (ref 0.44–1.00)
GFR calc Af Amer: 57 mL/min — ABNORMAL LOW (ref >60)
GFR calc non Af Amer: 49 mL/min — ABNORMAL LOW (ref >60)
Glucose, Bld: 180 mg/dL — ABNORMAL HIGH (ref 70–99)
Potassium: 3.5 mmol/L (ref 3.5–5.1)
Sodium: 140 mmol/L (ref 135–145)

## 2018-06-29 LAB — CBC
HCT: 28.5 % — ABNORMAL LOW (ref 36.0–46.0)
Hemoglobin: 9.3 g/dL — ABNORMAL LOW (ref 12.0–15.0)
MCH: 25.9 pg — ABNORMAL LOW (ref 26.0–34.0)
MCHC: 32.6 g/dL (ref 30.0–36.0)
MCV: 79.4 fL — ABNORMAL LOW (ref 80.0–100.0)
Platelets: 256 10*3/uL (ref 150–400)
RBC: 3.59 MIL/uL — ABNORMAL LOW (ref 3.87–5.11)
RDW: 15.7 % — ABNORMAL HIGH (ref 11.5–15.5)
WBC: 6.3 10*3/uL (ref 4.0–10.5)
nRBC: 0 % (ref 0.0–0.2)

## 2018-06-29 LAB — RESPIRATORY PANEL BY PCR

## 2018-06-29 MED ORDER — FUROSEMIDE 10 MG/ML IJ SOLN
20.0000 mg | Freq: Once | INTRAMUSCULAR | Status: AC
  Administered 2018-06-29: 20 mg via INTRAVENOUS
  Filled 2018-06-29: qty 2

## 2018-06-29 MED ORDER — POTASSIUM CHLORIDE CRYS ER 10 MEQ PO TBCR
30.0000 meq | EXTENDED_RELEASE_TABLET | Freq: Every day | ORAL | Status: DC
  Administered 2018-06-30 – 2018-07-01 (×2): 30 meq via ORAL
  Filled 2018-06-29 (×3): qty 3

## 2018-06-29 MED ORDER — METHYLPREDNISOLONE SODIUM SUCC 40 MG IJ SOLR
40.0000 mg | INTRAMUSCULAR | Status: DC
  Administered 2018-06-30: 05:00:00 40 mg via INTRAVENOUS
  Filled 2018-06-29: qty 1

## 2018-06-29 MED ORDER — FLUTICASONE PROPIONATE 50 MCG/ACT NA SUSP
1.0000 | Freq: Every day | NASAL | Status: DC
  Administered 2018-06-30 – 2018-07-01 (×3): 1 via NASAL
  Filled 2018-06-29: qty 16

## 2018-06-29 MED ORDER — FUROSEMIDE 10 MG/ML IJ SOLN
20.0000 mg | Freq: Once | INTRAMUSCULAR | Status: AC
  Administered 2018-06-30: 08:00:00 20 mg via INTRAVENOUS
  Filled 2018-06-29: qty 2

## 2018-06-29 MED ORDER — SALINE SPRAY 0.65 % NA SOLN
2.0000 | Freq: Two times a day (BID) | NASAL | Status: DC
  Administered 2018-06-29 – 2018-07-01 (×4): 2 via NASAL
  Filled 2018-06-29: qty 44

## 2018-06-29 NOTE — Progress Notes (Signed)
Spoke with pt's husband by phone and updated him about pulmonary plan.  Chesley Mires, MD [facility] Pulmonary/Critical Care 06/29/2018, 3:48 PM

## 2018-06-29 NOTE — Progress Notes (Signed)
Physical Therapy Treatment Patient Details Name: Amanda [name] MRN: [id number] DOB: September 01, 1940 Today's Date: 06/29/2018    History of Present Illness Pt is a 78 y.o. F with significant PMH of stage IV non Hodgkins lymphoma who presents with shortness of breath. Working diagnosis of HCAP    PT Comments    Pt asleep in bed on entry, easily rouseable and willing to work with therapy. Pt is limited in safe mobility by decreased strength and endurance. Pt is currently mod I for bed mobility, supervision for transfers and ambulation of 120 feet with RW. Upon returning to her room pt able to use BSC before being set up in the recliner. D/c recommendations remain appropriate. PT will continue to follow acutely.    Follow Up Recommendations  No PT follow up     Equipment Recommendations  None recommended by PT       Precautions / Restrictions Precautions Precautions: Fall Restrictions Weight Bearing Restrictions: No    Mobility  Bed Mobility Overal bed mobility: Modified Independent                Transfers Overall transfer level: Needs assistance Equipment used: None Transfers: Sit to/from Stand Sit to Stand: Supervision            Ambulation/Gait Ambulation/Gait assistance: Supervision Gait Distance (Feet): 120 Feet Assistive device: None Gait Pattern/deviations: Step-through pattern;Decreased stride length Gait velocity: decreased   General Gait Details: supervision for safety, slow, steady pace         Balance Overall balance assessment: Mild deficits observed, not formally tested                                          Cognition Arousal/Alertness: Awake/alert Behavior During Therapy: WFL for tasks assessed/performed Overall Cognitive Status: Within Functional Limits for tasks assessed                                           General Comments General comments (skin integrity, edema, etc.): Pt with 2L via [place] on  entry with SaO2 98%O2, removed supplemental O2 and pt was able to maintain SaO2 greater than 91%O2 with ambulation       Pertinent Vitals/Pain Pain Assessment: No/denies pain           PT Goals (current goals can now be found in the care plan section) Acute Rehab PT Goals Patient Stated Goal: regain strength PT Goal Formulation: With patient Time For Goal Achievement: 07/12/18 Potential to Achieve Goals: Good Progress towards PT goals: Progressing toward goals    Frequency    Min 3X/week      PT Plan Current plan remains appropriate       AM-PAC PT "6 Clicks" Mobility   Outcome Measure  Help needed turning from your back to your side while in a flat bed without using bedrails?: None Help needed moving from lying on your back to sitting on the side of a flat bed without using bedrails?: None Help needed moving to and from a bed to a chair (including a wheelchair)?: None Help needed standing up from a chair using your arms (e.g., wheelchair or bedside chair)?: None Help needed to walk in hospital room?: None Help needed climbing 3-5 steps with a railing? : A Little 6 Click Score:  23    End of Session   Activity Tolerance: Patient tolerated treatment well Patient left: in chair;with call bell/phone within reach;with chair alarm set Nurse Communication: Mobility status PT Visit Diagnosis: Unsteadiness on feet (R26.81)     Time: 9702-6378 PT Time Calculation (min) (ACUTE ONLY): 36 min  Charges:  $Gait Training: 8-22 mins $Therapeutic Activity: 8-22 mins                     Immaculate Crutcher B. Migdalia Dk PT, DPT Acute Rehabilitation Services Pager [phone] Office [phone]    [place] 06/29/2018, 2:04 PM

## 2018-06-29 NOTE — Progress Notes (Signed)
Received this morning at 3:46am to my work email:  Virgel Manifold,  I am writing this around 2:30 AM because I want you to have it first thing this morning. Dorla's daughters and I are concerned about her being hospitalized two weeks in a row: 4/13-4/18 in [place], and 4/24 to the present at [facility]. Two Fridays in a row I received a call from Dr. Talbert Cage:  4/17- He seemed quite focused and ready to be more hands on, promising to follow her intensively after her discharge, considering possible immunotherapy as a conservative first step. I agreed that seemed the best, but reminded him then that neither her most recent outpatient care, nor the first hospitalization had resolved her pneumonia and respiratory struggles.  On 4/24 he called again after having checked in on her at [facility] ER where she was in acute respiratory distress from unresolved pneumonia   On that call he repeated 7-8 times that he couldn't move forward with anything until she could breathe better. I reminded him again that at our first appointment with him on 3/20, she already had a current diagnosis of pneumonia. He made the referral to pulmonologist and said that should take care of it. Now on 4/24 he adds at the end of the call that he would try to make arrangements to have a port installed before her discharge from [facility]. It didn't hit until later that he also used the word chemo in conjunction with the port.  Just one week later we've made a quantum leap from immunotherapy to chemotherapy! Her daughters and I don't get it: immunotherapy was discussed due to Aymar's age and low reserves from 6 continuous weeks of illness. Now that she is sicker than ever, chemo?  The apparent inconsistency is alarming. Maybe we're missing something here but it did not help that he repeated himself so many times, trying to convince me, or himself, of the very concern I've been raising from day one. He said he was going out of town for the weekend.  Meanwhile Dr.  Loanne Drilling, endocrinology, reviewed her case, and during a virtual visit weighed in that it appeared to him that resolving the Hypercalcemia and other imbalances, plus the pneumonia, would likely require treating the lymphoma. He put in a lab order to be attached to the labs scheduled by Dr. Talbert Cage for 4/24 at 2:00 prior to a 3:00 office visit. Since [place] was admitted to the ER around 10:30, those events were sidetracked.  We get that COVID 19 is happening to the whole wide world. It's hitting our world pretty hard, too!  This second hospitalization under quarantine protocols has worn both of [place] to a nub. Meghana is fighting the best she can without me, her coach, Therapist, sports and caregiver.  We respect the protocols but I am considering obtaining a court order to allow me to self-quarantine in her room with her. One year ago 06/23/2017, I buried my  mother after her 5-year battle with lymphoma. At the rate things are going I don't know if Ireene's battle will last five weeks past her biopsy confirmation on 05/26/18!  She had better not be discharged to outpatient care until her respiratory issues have been adequately addressed!  Forwarded email to Dr Maylon Peppers who called the patient husband to help address concerns.  I also reached out to the husband.  He is feeling better this afternoon about the plan and is appreciative of pulmonary's consult as well as Dr Lorette Ang offer to seek out a  second opinion at [facility]. He is satisfied that everyone is working at trying to find answers for his wife. At this time he had no further needs or questions. He has multiple ways of reaching me if needed. He appreciated everyone reaching out today. Will continue to follow.

## 2018-06-29 NOTE — Telephone Encounter (Signed)
Received Physician Orders from [place]; forwarded to provider/SLS 04/27

## 2018-06-29 NOTE — TOC Initial Note (Signed)
Transition of Care [facility]) - Initial/Assessment Note    Patient Details  Name: Amanda [name] MRN: [id number] Date of Birth: Jul 23, 1940  [facility] CM/SW Contact:    Bethena Roys, RN Phone Number: 06/29/2018, 2:06 PM  Clinical Narrative:  Pt presented for HCAP- treating with IV antibiotics at this time. PTA from home with spouse. Pt was previously active with [facility] for RN, PT, SLP. Husband would like an aide to be added. CM did call [facility] to add Aide to services. CM will continue to monitor for additional transition of care needs and DME.               Expected Discharge Plan: [place] Barriers to Discharge: Continued Medical Work up   Patient Goals and CMS Choice Patient states their goals for this hospitalization and ongoing recovery are:: "to feel better" CMS Medicare.gov Compare Post Acute Care list provided to:: (Patient currently active- no need for new list wants to return with [facility]) Choice offered to / list presented to : Patient  Expected Discharge Plan and Services Expected Discharge Plan: [place] In-house Referral: NA Discharge Planning Services: CM Consult Post Acute Care Choice: [place] arrangements for the past 2 months: Single Family Home                 DME Arranged: N/A DME Agency: NA       HH Arranged: RN, Disease Management, PT, Nurse's Aide, Speech Therapy HH Agency: [place] ([facility]) Date HH Agency Contacted: 06/29/18 Time HH Agency Contacted: 41 Representative spoke with at [place] Arrangements/Services Living arrangements for the past 2 months: [place] with:: Spouse Patient language and need for interpreter reviewed:: Yes Do you feel safe going back to the place where you live?: Yes      Need for Family Participation in Patient Care: Yes (Comment) Care giver support system in place?: Yes (comment) Current home services:  Home PT, Home RN, Other (comment)(SLP) Criminal Activity/Legal Involvement Pertinent to Current Situation/Hospitalization: No - Comment as needed  Activities of Daily Living Home Assistive Devices/Equipment: Eyeglasses ADL Screening (condition at time of admission) Patient's cognitive ability adequate to safely complete daily activities?: Yes Is the patient deaf or have difficulty hearing?: No Does the patient have difficulty seeing, even when wearing glasses/contacts?: No Does the patient have difficulty concentrating, remembering, or making decisions?: No Patient able to express need for assistance with ADLs?: Yes Does the patient have difficulty dressing or bathing?: No Independently performs ADLs?: Yes (appropriate for developmental age) Does the patient have difficulty walking or climbing stairs?: Yes Weakness of Legs: None Weakness of Arms/Hands: None  Permission Sought/Granted Permission sought to share information with : Case Manager                Emotional Assessment Appearance:: Appears stated age Attitude/Demeanor/Rapport: Engaged Affect (typically observed): Accepting Orientation: : Oriented to Self, Oriented to Situation, Oriented to  Time, Oriented to Place Alcohol / Substance Use: Not Applicable Psych Involvement: No (comment)  Admission diagnosis:  SOB (shortness of breath) [R06.02] Pleural effusion [J90] Hypoxia [R09.02] Elevated troponin [R79.89] HCAP (healthcare-associated pneumonia) [J18.9] Non-Hodgkin's lymphoma, unspecified body region, unspecified non-Hodgkin lymphoma type ([place]) [C85.90] Covid-19 Virus not Detected [IMO0001] Patient Active Problem List   Diagnosis Date Noted  . Pleural effusion   . SOB (shortness of breath)   . HCAP (healthcare-associated pneumonia) 06/26/2018  . Hypercalcemia   .  Weakness 06/16/2018  . Acute kidney injury ([place]) 06/16/2018  . Bronchiectasis without complication ([place]) 79/05/8331  . DOE (dyspnea on exertion)  06/05/2018  . 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[place]) 05/21/2018  . Bronchospasm 04/24/2018  . Fatigue 04/24/2018  . Facial numbness 02/17/2017  . Abnormal CT of the abdomen 10/27/2015  . Elevated serum creatinine 10/27/2015  . 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[place]) 12/05/2014  . Acute respiratory failure with hypoxia ([place]) 11/20/2014  . HLD (hyperlipidemia) 11/17/2014  . GERD (gastroesophageal reflux disease) 11/17/2014  . Cervical lymphadenitis 12/06/2013  . Family history of ovarian cancer 05/31/2013  . CAP (community acquired pneumonia) 07/02/2012  . Chest pain 07/02/2012  . Abnormal CT scan, head 07/02/2012  . Postmenopausal 03/10/2012  . Family history of breast cancer 12/12/2011  . IBS (irritable bowel syndrome) 12/12/2011  . Abdominal bloating 12/12/2011  . Chronic constipation 12/12/2011  . CARPAL TUNNEL SYNDROME, LEFT 04/21/2009  . GAIT DISTURBANCE 04/21/2009  . HYPERLIPIDEMIA 01/12/2009  . CERVICALGIA 09/12/2008  . Hypothyroidism 08/06/2006  . OSTEOPENIA 08/06/2006  . URINARY INCONTINENCE 08/06/2006  . SKIN CANCER, HX OF 08/06/2006   PCP:  [name], DO Pharmacy:   [facility] [phone] [address], [address] AT [facility] [address] [place] [address] Phone: [phone] Fax: [phone]     Social Determinants of Health ([place]) Interventions    Readmission Risk Interventions No flowsheet data found.

## 2018-06-29 NOTE — Telephone Encounter (Signed)
Pt was admitted to hospital and is still a current inpatient. Encounter will be closed.

## 2018-06-29 NOTE — Progress Notes (Signed)
Amanda [name]   DOB:Jul 18, 1940   XB#:[id number]    HEME/ONC OVERVIEW: 1.Stage IV NHL, favoring nodal marginal zone lymphoma -05/2018: ? CT chest showed pathologic lymphadenopathy involving bilateral axilla, mediastinum as well as multiple small lung nodules and possible splenomegaly ? PET showed FDG-avid lymphadenopathy in neck, chest, abdomen and pelvis, questionable involvement of the lung, and focal involvement of left scapula and acetabulum  ? Left axillary LN excisional bx: NHL, favoring low-grade marginal zone lymphoma; Cyclin-D1 neg ([facility])  TREATMENT REGIMEN:  TBD   ASSESSMENT & PLAN:  Acute hypoxic respiratory failure -I independently reviewed the radiologic images of the recent CTA, and agree with the findings as documented -In summary, CTA chest was negative for PTE, but did demonstrate persistent thoracic adenopathy, very minimally enlarged since recent CT.  In addition, there was enlarging bilateral pleural effusion in the lower lobes, etiology unclear.  -I discussed the CT results in detail with the patient -I informed her that it would be very unusual for indolent lymphoma to cause bilateral pleural effusions (usually seen with more aggressive lymphomas).  While transformation cannot be ruled out, the thoracic LN's are in a difficult-to-biopsy location that may require VATS, which would be very invasive. Furthermore, patient just had left axillary LN bx less than a month ago, which makes transformation somewhat less likely within one month  -Given the unclear etiology of bilateral pleural effusion and bronchiectasis, I recommend consulting pulmonary medicine for further evaluation, including possible diagnostic/therapeutic thoracentesis to further investigate the etiology of the pleural effusion   Stage IV NHL, favoring nodal marginal zone lymphoma  -As discussed above, nodal marginal zone lymphoma tends to be relatively indolent, and often does not require urgent  treatment -However, the patient's case has been somewhat unusual, including hypercalcemia and higher-than-expected FDG avidity of some of the thoracic LN's, which may warrant treatment, such as rituximab with dose-reduced bendamustine -I discussed with the patient at length that before we can initiate systemic therapy, we would need to rule out any infection, as immunosuppression in the setting of infection would not be advisable  -Pending the work-up above, we may consider placing a port prior to discharge to facilitate outpatient treatment   Family engagement -Patient's husband, [name], has been very concerned about the patient's health decline over the past several weeks, and has communicated with me via multiple emails (to the clinic) expressing his concerns -I have had several lengthy discussions with him and updated him on the patient's clinical status. As discussed above, I emphasized with him that the first priority is to evaluate the cause of her respiratory failure and pleural effusions -I also reviewed the national comprehensive cancer network guidelines in detail on the management of nodal marginal zone lymphoma with the patient's spouse  -If the respiratory failure is caused by infection, then the infection will need to be adequately treated before starting chemotherapy/immunotherapy, as the risk of worsening infection from immunosuppression would be potentially life-threatening -However, if the pleural effusion is caused by lymphoma (such as confirmation by flow from the pleural fluid), then we can consider starting treatment as soon as possible -Given the unclear clinical presentation, I also discussed with the patient's spouse regarding seeking a second opinion, possibly at [facility], which he was agreeable -I have reached out to the [facility] hematology division and see if she can be established for a second opinion   Thank you for caring for our mutual patient. Please do not  hesitate to contact me if there are any  questions.   Tish Men, MD 06/29/2018  10:34 AM   Subjective:  [name] reports that her breathing still some more limited this morning, but it is overall slightly better.  She feels somewhat "loopy", and get her words mixed up periodically.  She otherwise denies any other complaint this morning.  ROS: Constitutional: ( - ) fevers, ( - )  chills , ( - ) night sweats Ears, nose, mouth, throat, and face: ( - ) mucositis, ( - ) sore throat Respiratory: ( + ) cough, ( + ) dyspnea, ( - ) wheezes Cardiovascular: ( - ) palpitation, ( - ) chest discomfort, ( - ) lower extremity swelling Gastrointestinal:  ( - ) nausea, ( - ) heartburn, ( - ) change in bowel habits Skin: ( - ) abnormal skin rashes Behavioral/Psych: ( - ) mood change, ( - ) new changes  All other systems were reviewed with the patient and are negative.  Objective:  Vitals:   06/29/18 0444 06/29/18 0743  BP: 137/75   Pulse: 81 75  Resp: 19 15  Temp: 98.1 F (36.7 C)   SpO2: 96% 96%     Intake/Output Summary (Last 24 hours) at 06/29/2018 1034 Last data filed at 06/29/2018 0600 Gross per 24 hour  Intake 1134.12 ml  Output 1050 ml  Net 84.12 ml    GENERAL: alert, no distress and comfortable, wearing nasal canula  SKIN: skin color, texture, turgor are normal, no rashes or significant lesions EYES: conjunctiva are pink and non-injected, sclera clear OROPHARYNX: no exudate, no erythema; lips, buccal mucosa, and tongue normal  NECK: supple, non-tender LUNGS: decreased air movement in bilateral lung bases, no wheezing  HEART: regular rate & rhythm and no murmurs and no lower extremity edema ABDOMEN: soft, non-tender, non-distended, normal bowel sounds PSYCH: alert, fluent speech, occasional word-finding difficulty    Labs:  Lab Results  Component Value Date   WBC 6.3 06/29/2018   HGB 9.3 (L) 06/29/2018   HCT 28.5 (L) 06/29/2018   MCV 79.4 (L) 06/29/2018   PLT 256 06/29/2018    NEUTROABS 5.0 06/26/2018    Lab Results  Component Value Date   NA 140 06/29/2018   K 3.5 06/29/2018   CL 110 06/29/2018   CO2 21 (L) 06/29/2018    Studies:  Dg Chest Port 1 View  Result Date: 06/27/2018 CLINICAL DATA:  Current history of lymphoma with shortness of breath and cough. Follow-up effusions and possible pneumonia. EXAM: PORTABLE CHEST 1 VIEW COMPARISON:  CTA chest yesterday. Chest x-rays yesterday and earlier. FINDINGS: Cardiac silhouette mildly enlarged, unchanged. Thoracic aorta ectatic and atherosclerotic, unchanged. Since yesterday, no change in the moderately large BILATERAL pleural effusions, LEFT greater than RIGHT, and the airspace consolidation in the BILATERAL LOWER LOBES, RIGHT MIDDLE LOBE and lingula. Pulmonary vascularity normal. 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 IMPRESSION: 1. Stable moderately large BILATERAL pleural effusions, LEFT greater than RIGHT, and associated passive atelectasis and/or pneumonia in the BILATERAL LOWER LOBES, RIGHT MIDDLE LOBE and lingula. 2. Interstitial opacities throughout both lungs, unchanged since yesterday's examinations but new since 06/16/2018, query lymphangitic carcinomatosis. Interstitial edema is felt less likely since pulmonary vascularity is normal. Electronically Signed   By: Evangeline Dakin M.D.   On: 06/27/2018 19:11

## 2018-06-29 NOTE — Consult Note (Addendum)
NAME:  Amanda [name], MRN:  [id number], DOB:  Dec 30, 1940, LOS: 3 ADMISSION DATE:  06/26/2018, CONSULTATION DATE:  06/29/18 REFERRING MD:  Maylon Peppers - oncology , CHIEF COMPLAINT:  Dyspnea   Brief History   78 year old F with stage IV NH Lymphoma, worsening bilatearl pleural effusions, intermittent shortness of breath. PCCM asked to evaluate  History of present illness   78 yo F with PMH stage IV NH Lymphoma, hypothyroidism, Recent PNA, Bronchiectasis, OA, HLD, GERD who presented to ED 4/24 with progressive shortness of breath. Associated symptoms include non-productive cough. Initially, dyspnea was limited to exertion but progressed to mild dyspnea at rest.  Of note patient was recently hospitalized 4/14-4/18vfor progression of cough and shortness of brath, and was treated with rocephin empirically. In ED patient CXR revealed multifocal opacities and patient was admitted to hospital for HCAP.  CTA chest reveals no PE but does show progression of lymphoma and bilateral pleural effusions.   Past Medical History   Anemia     Arthritis    Cancer ([place])    Constipation, chronic    GERD (gastroesophageal reflux disease)    zantac   Heart murmur    History of blood transfusion 1959   [place]   Hyperlipidemia    Hypothyroidism    Lymphoproliferative disorder ([place])    Macular degeneration 2013   Both eyes    Osteopenia    Pneumonia    PONV (postoperative nausea and vomiting)    needs little anesthesia   Shingles    Shortness of breath    on exertion   Spleen enlarged    SUI (stress urinary incontinence, female)    Wears glasses      Significant Hospital Events   4/24> admitted  4/27> PCCM consulted given bilateral pleural effusions of unknown etiology   Consults:  Oncology PCCM Procedures:    Significant Diagnostic Tests:  CT angio chest 4/24> No PE. Progression of thoracic adenopathy suggestive of worsening lymphoma. Bilateral pleural  effusions, increased from prior.   Micro Data:  SARS-CoV-2 4/24> not detected BCx 4/24> NGTD Sputum cx 4/24>>> RVP 4/27>>>  Antimicrobials:  vanc 4/24>>> Cefepime 4/24>>>   Interim history/subjective:  Patient is HDS on RA, SpO2 95%.  Patient is in chair eating salad, talking with husband. Patient asks "if they knew this fluid was there all along why didn't they just tap it already?"  Patient adds "We really should get a second opinion for the cancer."   Objective   Blood pressure 137/75, pulse 75, temperature 98.1 F (36.7 C), temperature source Oral, resp. rate 15, height 5\' 1"  (1.549 m), weight 66.2 kg, SpO2 96 %.        Intake/Output Summary (Last 24 hours) at 06/29/2018 1235 Last data filed at 06/29/2018 0600 Gross per 24 hour  Intake 1134.12 ml  Output 1050 ml  Net 84.12 ml   Filed Weights   06/26/18 1131 06/27/18 0620 06/29/18 0444  Weight: 64 kg 64.2 kg 66.2 kg    Examination: General: WDWN elderly female, seated in bed, NAD HENT: NCAT, pink mmm, trachea midline, anicteric sclera  Lungs: CTA bilaterally (anteriorly, laterally, posteriorly). No accessory muscle recruitment on room air. Symmetrical chest expansion Cardiovascular: RRR s1s2 no r/g/m. No JVD. 2+ radial pulses  Abdomen: Soft, round, ndnt, normoactive x4 Extremities: Symmetrical bulk and tone, no clubbing, no cyanosis.  Neuro: AAOx4. PERRL. Following commands. No focal deficits  Skin: clean, dry, warm, without rash GU: [facility] Problem list  Assessment & Plan:   Stage IV  NH Lymphoma -Favoring nodal marginal zone lymphoma P -Oncology following -Given hypercalcemia and interval thoracic lymph node enlargement oncology is considering systemic treatment   Acute respiratory insufficiency with hypoxia, improved  -Bilateral pleural effusion on CTA  4/24  -CTA negative for PE 4/24  -COVID-19 not detected (4/24) -?HCAP  P -Will discuss utility of diagnostic thoracentesis  with PCCM attending. Patient is stable on room air at this time, without complaint of SOB. Contemplating utility of inpatient diagnostic thora vs. outpatient follow up. Consider repeat CT given improvement in symptoms. If felt that diagnostic thora would be of benefit at this time, needs bedside US evaluation of pleural fluid. Per oncology note, oncology would like evaluation of pleural fluid etiology prior to initiating systemic therapy.   -Currently SpO2 95% on RA, can use supplemental O2 PRN for goal SpO2 >92 -Continue vanc/cefepime  -Lasix 20 x1  -Follow up sputum culture -Send RVP  -IS q1hr  -OOB to chair, PT/OT -pulmicort, duoneb BID, PRN albuterol  -AM CXR  Rest per primary   Best practice:  Diet: Regular Pain/Anxiety/Delirium protocol (if indicated): APAP DVT prophylaxis: lovenox  GI prophylaxis: pepcid  Mobility: PT/OT  Code Status: Full  Family Communication: Husband, lonnie, updated via phone  Disposition: Continue current level of care   Labs   CBC: Recent Labs  Lab 06/26/18 1147 06/26/18 2234 06/28/18 0313 06/29/18 0449  WBC 8.1 7.4 5.3 6.3  NEUTROABS 5.0  --   --   --   HGB 11.6* 11.0* 10.2* 9.3*  HCT 36.1 32.7* 30.7* 28.5*  MCV 80.9 78.4* 78.9* 79.4*  PLT 247 237 234 465    Basic Metabolic Panel: Recent Labs  Lab 06/26/18 1147 06/26/18 2234 06/27/18 0450 06/27/18 1856 06/28/18 0313 06/29/18 0449  NA 132*  --  136 137 137 140  K 3.4*  --  3.9 3.7 3.8 3.5  CL 96*  --  101 105 105 110  CO2 24  --  23 20* 20* 21*  GLUCOSE 99  --  87 110* 148* 180*  BUN 15  --  13 12 12 20   CREATININE 1.21* 1.27* 1.32* 1.12* 1.10* 1.07*  CALCIUM 11.0*  --  10.2 10.2 10.0 10.3   GFR: Estimated Creatinine Clearance: 37.2 mL/min (A) (by C-G formula based on SCr of 1.07 mg/dL (H)). Recent Labs  Lab 06/26/18 1147 06/26/18 2234 06/28/18 0313 06/29/18 0449  WBC 8.1 7.4 5.3 6.3    Liver Function Tests: Recent Labs  Lab 06/26/18 1147 06/27/18 0450  06/27/18 1856  AST 28 22 28   ALT 15 13 17   ALKPHOS 72 61 65  BILITOT 0.8 0.7 0.7  PROT 6.6 5.0* 5.4*  ALBUMIN 3.6 2.8* 2.9*   No results for input(s): LIPASE, AMYLASE in the last 168 hours. No results for input(s): AMMONIA in the last 168 hours.  ABG    Component Value Date/Time   PHART 7.458 (H) 06/27/2018 2050   PCO2ART 34.5 06/27/2018 2050   PO2ART 77.1 (L) 06/27/2018 2050   HCO3 24.1 06/27/2018 2050   O2SAT 95.9 06/27/2018 2050     Coagulation Profile: No results for input(s): INR, PROTIME in the last 168 hours.  Cardiac Enzymes: Recent Labs  Lab 06/26/18 1534 06/27/18 0803 06/27/18 1027 06/27/18 1344 06/27/18 1856  TROPONINI 0.10* 0.08* 0.07* 0.07* 0.07*    HbA1C: Hgb A1c MFr Bld  Date/Time Value Ref Range Status  11/18/2014 12:57 AM 6.1 (H) 4.8 - 5.6 % Final  Comment:    (NOTE)         Pre-diabetes: 5.7 - 6.4         Diabetes: >6.4         Glycemic control for adults with diabetes: <7.0   11/29/2011 03:51 PM 5.8 4.6 - 6.5 % Final    Comment:    Glycemic Control Guidelines for People with Diabetes:Non Diabetic:  <6%Goal of Therapy: <7%Additional Action Suggested:  >8%     CBG: No results for input(s): GLUCAP in the last 168 hours.  Review of Systems:   Denies fever, chills, night sweats Endorses cough, shortness of breath.  Denies chest pain/pressure, denies palpitation, denies lower extremity edema Denies n/v/d, denies heartburn  Denies rash, pruritis    Past Medical History  She,  has a past medical history of Anemia, Arthritis, Cancer ([place]), Constipation, chronic, GERD (gastroesophageal reflux disease), Heart murmur, History of blood transfusion (1959), Hyperlipidemia, Hypothyroidism, Lymphoproliferative disorder ([place]), Macular degeneration (2013), Osteopenia, Pneumonia, PONV (postoperative nausea and vomiting), Shingles, Shortness of breath, Spleen enlarged, SUI (stress urinary incontinence, female), and Wears glasses.   Surgical History     Past Surgical History:  Procedure Laterality Date   BREAST EXCISIONAL BIOPSY Left [place]   CATARACT EXTRACTION  2009, 2011   BOTH EYES   CESAREAN SECTION  1959   COLONOSCOPY      Dr Nerstrand     X2   HYSTEROSCOPY W/D&C  01/07/2012   Procedure: DILATATION AND CURETTAGE /HYSTEROSCOPY;  Surgeon: Terrance Mass, MD;  Location: [facility];  Service: Gynecology;  Laterality: N/A;  intrauterine foley catheter for tamponode    LYMPH NODE BIOPSY Left 05/26/2018   Procedure: LEFT AXILLARY LYMPH NODE BIOPSY;  Surgeon: Fanny Skates, MD;  Location: [facility];  Service: General;  Laterality: Left;   TONSILLECTOMY     TONSILLECTOMY AND ADENOIDECTOMY     TUBAL LIGATION     BY LAPAROSCOPY   WISDOM TOOTH EXTRACTION       Social History   reports that she has never smoked. She has never used smokeless tobacco. She reports current alcohol use. She reports that she does not use drugs.   Family History   Her family history includes Breast cancer (age of onset: 16) in her mother; COPD in her paternal grandfather; Diabetes in her father; Hyperlipidemia in her brother; Hypertension in her father; Kidney failure in her father; Ovarian cancer in her mother; Prostate cancer in her father; Stroke in her maternal grandfather. There is no history of Hypercalcemia.   Allergies Allergies  Allergen Reactions   Phenergan [Promethazine Hcl] Other (See Comments)    Jerking/agitation   Levofloxacin Nausea And Vomiting   Pravastatin Sodium Nausea And Vomiting     Home Medications  Prior to Admission medications   Medication Sig Start Date End Date Taking? Authorizing Provider  acetaminophen (TYLENOL) 500 MG tablet Take 1,000 mg by mouth every 6 (six) hours as needed for mild pain.   Yes [provider]  albuterol (PROVENTIL) (2.5 MG/3ML) 0.083% nebulizer solution Take 3 mLs (2.5 mg total) by nebulization every 6 (six) hours as  needed for wheezing or shortness of breath. 04/24/18  Yes Carollee Herter, Yvonne R, DO  amLODipine (NORVASC) 5 MG tablet Take 1 tablet (5 mg total) by mouth daily with breakfast. 06/21/18  Yes Mariel Aloe, MD  buPROPion (WELLBUTRIN XL) 150 MG 24 hr tablet TAKE 1 TABLET BY MOUTH  EVERY DAY 02/10/18  Yes Roma Schanz R, DO  famotidine (PEPCID) 20 MG tablet One at bedtime Patient taking differently: Take 20 mg by mouth at bedtime. One at bedtime  06/05/18  Yes Tanda Rockers, MD  feeding supplement, ENSURE ENLIVE, (ENSURE ENLIVE) LIQD Take 237 mLs by mouth 2 (two) times daily between meals. 06/20/18  Yes Mariel Aloe, MD  levothyroxine (SYNTHROID, LEVOTHROID) 125 MCG tablet TAKE 1 TABLET BY MOUTH DAILY 05/15/18  Yes Roma Schanz R, DO  omeprazole (PRILOSEC) 20 MG capsule Take 2 x 30 min before breakfast Patient taking differently: Take 20 mg by mouth daily.  06/05/18  Yes Tanda Rockers, MD  ondansetron (ZOFRAN) 4 MG tablet Take 1 tablet (4 mg total) by mouth every 8 (eight) hours as needed for nausea or vomiting. 06/08/18  Yes Roma Schanz R, DO  albuterol (PROVENTIL HFA;VENTOLIN HFA) 108 (90 Base) MCG/ACT inhaler Inhale 2 puffs into the lungs every 6 (six) hours as needed for up to 30 days for wheezing or shortness of breath. 05/22/18 06/21/18  Tish Men, MD  potassium chloride 20 MEQ TBCR Take 20 mEq by mouth daily for 3 days. 06/21/18 06/24/18  Mariel Aloe, MD     Eliseo Gum MSN, AGACNP-BC [place] [phone] If no answer, [phone] 06/29/2018, 1:52 PM

## 2018-06-29 NOTE — Progress Notes (Signed)
PROGRESS NOTE  Amanda [name]  URK:[id number] DOB: Sep 06, 1940 DOA: 06/26/2018 PCP: [name], DO   Brief Narrative: [phone] w/ Hx stage IV NHLrecent ADMIT for low grade fever- neg covid, aki, hypercalcemia (06/16/2018 to  06/20/2018), p/w to [facility] with shortness of breath and new oxygen requirement., DOE and at rest, cough but no fever/no wheezing, no sick contacts.Seen by oncologist likely not from [place]. Now in  ER w Rf 39 oxygen sat 88%  RA, WBC 8.1 creat 1.21.  Calcium is 11.0.  Troponin is 0.1 and glucose 99.  Urinalysis is negative.  Chest x-ray showed bilateral basal thickening with some pleural effusion.  BNP 171.  CTA NO PE but Extensive thoracic adenopathy mildly progressive from previous  CT last month.Worsening of lymphoma.  Splenomegaly and upper abdomen adenopathy are grossly stable.  Enlarging bilateral pleural effusions with worsening airspace opacities in both lower lobes the right middle lobe and lingula. Patern is  nonspecific but could be seen in the secondary to superimposed atelectasis,infection or edema.COVID-19 testing was negative. Pt was started on broad spectrumfor antibiotics and was admitted.  Subjective: Patient still mildly short of breath, coughing on deep breath.  She is afebrile, WBC count stable. Complains of constipation-reports she will take her home laxatives that her husband will bring. On 2 L nasal cannula.   Assessment & Plan:   Air space opacity noted in the CT scan with bilateral pleural effusions, possible HCAP. On empiric-vanco/cefepime. Strep Ag neg, COVID 19 neg. blood culture NGTD.Continue pulmonary toileting. Consider stopping vanco.  Shortness of breath/dyspnea on exertion with acute respiratory failure with hypoxia due to #1, history of bronchiectasis, also wheezing. no PE.  Tachypnea seems to be better.  Known to Dr. Melvyn Novas from pulmonary, discussed with oncologist and advised pulm eval- spoke w Dr Halford Chessman, will see if there is any benefits of  tapping, look for lymphoma cells, rule out infection. Cont bronchodilators, steroids- change to 40 mg daily, antitussives.  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 Given her worsening adenopathy, discussed with  Dr.  Maylon Peppers- who feels patient's issues may most likely pulmonary.  Doubt transformation of lymphoma, will plan to treat her lymphoma once stable from respiratory wise and requesting pulmonary evaluation.  May need port placement prior to discharge.    Hypercalcemia, in the setting of NHL , recently seen by endocrinology Dr Loanne Drilling 4/23, 25 vit d was low- avoiding supplement and calcium, pth was low. If persistent Endocrine advised cinacalcet. Corrected ca is in 11s and stable.  Hypothyroidism : Continue her Synthroid  HTN: Stable on amlodipine  Mildy elevated troponin:suspect demand mismatch in the setting of #1, troponin 0 0.06->0.1->0.08>0.07   Patient denied chest pain. EKG-no overt signs of ischemia, Old anterior infarct. Obtaiend limited echo and has normal EF with no acute finding.  Constipation: continue Laxative as needed.  DVT prophylaxis: lovenox Code Status: wishes to be full code Family Communication: discussed with patient in detail.  Discussed with patient's husband over the phone  4/26.  Will obtain pulmonary evaluation.   Disposition Plan: remains inpatient pending clinical improvement.  Consultants:  None  Procedures: 4/25 Limited echo Technically difficult  1. The left ventricle has normal systolic function, with an ejection fraction of 60-65%. The cavity size was normal. There is moderately increased left ventricular wall thickness.  2. The right ventricle has normal systolc function. The cavity was normal. There is no increase in right ventricular wall thickness.  3. Large pleural effusion.  CTA Extensive thoracic adenopathy mildly progressive from previous  CT last month.Worsening of lymphoma.  Splenomegaly and upper abdomen  adenopathy are grossly stable.  Enlarging bilateral pleural effusions with worsening airspace opacities in both lower lobes the right middle lobe and lingula. Patern is  nonspecific but could be seen in the secondary to superimposed atelectasis,infection or edema.  Antimicrobials: Anti-infectives (From admission, onward)   Start     Dose/Rate Route Frequency Ordered Stop   06/28/18 1600  vancomycin (VANCOCIN) 1,250 mg in sodium chloride 0.9 % 250 mL IVPB  Status:  Discontinued     1,250 mg 166.7 mL/hr over 90 Minutes Intravenous Every 48 hours 06/26/18 2205 06/28/18 1023   06/28/18 1030  vancomycin (VANCOCIN) IVPB 1000 mg/200 mL premix     1,000 mg 200 mL/hr over 60 Minutes Intravenous Every 36 hours 06/28/18 1023     06/26/18 2215  ceFEPIme (MAXIPIME) 2 g in sodium chloride 0.9 % 100 mL IVPB     2 g 200 mL/hr over 30 Minutes Intravenous Every 12 hours 06/26/18 2153 07/04/18 1959   06/26/18 1330  vancomycin (VANCOCIN) IVPB 1000 mg/200 mL premix     1,000 mg 200 mL/hr over 60 Minutes Intravenous  Once 06/26/18 1327 06/26/18 1618   06/26/18 1330  piperacillin-tazobactam (ZOSYN) IVPB 3.375 g     3.375 g 12.5 mL/hr over 240 Minutes Intravenous  Once 06/26/18 1327 06/26/18 1455       Objective: Vitals:   06/28/18 2000 06/28/18 2008 06/29/18 0444 06/29/18 0743  BP:  (!) 142/67 137/75   Pulse: 81 79 81 75  Resp: 20 (!) 28 19 15   Temp:  97.7 F (36.5 C) 98.1 F (36.7 C)   TempSrc:  Oral Oral   SpO2: 96% 95% 96% 96%  Weight:   66.2 kg   Height:        Intake/Output Summary (Last 24 hours) at 06/29/2018 1015 Last data filed at 06/29/2018 0600 Gross per 24 hour  Intake 1134.12 ml  Output 1050 ml  Net 84.12 ml   Filed Weights   06/26/18 1131 06/27/18 0620 06/29/18 0444  Weight: 64 kg 64.2 kg 66.2 kg   Weight change:   Body mass index is 27.57 kg/m.  Intake/Output from previous day: 04/26 0701 - 04/27 0700 In: 1354.1 [P.O.:460; I.V.:894.1] Out: 1650 [Urine:1650]  Intake/Output this shift: No intake/output data recorded.  Examination: General exam: Calm, comfortable, not in acute distress, HEENT:Oral mucosa moist, Ear/Nose WNL grossly, dentition normal. Respiratory system: Bilateral diminished breath sounds at the base, no wheezing  Cardiovascular system: regular rate and rhythm, S1 & S2 heard, No JVD/murmurs. Gastrointestinal system: Abdomen soft, non-tender, non-distended, BS +. No hepatosplenomegaly palpable. Nervous System:Alert, awake and oriented at baseline. Able to move UE and LE, sensation intact. Extremities: No edema, distal peripheral pulses palpable.  Skin: No rashes,no icterus. MSK: Normal muscle bulk,tone, power  Medications:  Scheduled Meds: . amLODipine  5 mg Oral Q breakfast  . aspirin EC  81 mg Oral Daily  . budesonide (PULMICORT) nebulizer solution  0.25 mg Nebulization BID  . buPROPion  150 mg Oral Daily  . docusate sodium  100 mg Oral BID  . enoxaparin (LOVENOX) injection  40 mg Subcutaneous Q24H  . famotidine  20 mg Oral QHS  . feeding supplement (ENSURE ENLIVE)  237 mL Oral BID BM  . ipratropium-albuterol  3 mL Nebulization BID  . levothyroxine  125 mcg Oral Q0600  . mouth rinse  15 mL Mouth Rinse BID  .  methylPREDNISolone (SOLU-MEDROL) injection  40 mg Intravenous Q8H  . pantoprazole  40 mg Oral Daily  . potassium chloride  20 mEq Oral Daily   Continuous Infusions: . sodium chloride Stopped (06/26/18 1343)  . sodium chloride 75 mL/hr at 06/29/18 0600  . ceFEPime (MAXIPIME) IV 2 g (06/29/18 1006)  . vancomycin 1,000 mg (06/28/18 1206)    Data Reviewed: I have personally reviewed following labs and imaging studies  CBC: Recent Labs  Lab 06/26/18 1147 06/26/18 2234 06/28/18 0313 06/29/18 0449  WBC 8.1 7.4 5.3 6.3  NEUTROABS 5.0  --   --   --   HGB 11.6* 11.0* 10.2* 9.3*  HCT 36.1 32.7* 30.7* 28.5*  MCV 80.9 78.4* 78.9* 79.4*  PLT 247 237 234 784   Basic Metabolic Panel: Recent Labs  Lab 06/26/18  1147 06/26/18 2234 06/27/18 0450 06/27/18 1856 06/28/18 0313 06/29/18 0449  NA 132*  --  136 137 137 140  K 3.4*  --  3.9 3.7 3.8 3.5  CL 96*  --  101 105 105 110  CO2 24  --  23 20* 20* 21*  GLUCOSE 99  --  87 110* 148* 180*  BUN 15  --  13 12 12 20   CREATININE 1.21* 1.27* 1.32* 1.12* 1.10* 1.07*  CALCIUM 11.0*  --  10.2 10.2 10.0 10.3   GFR: Estimated Creatinine Clearance: 37.2 mL/min (A) (by C-G formula based on SCr of 1.07 mg/dL (H)). Liver Function Tests: Recent Labs  Lab 06/26/18 1147 06/27/18 0450 06/27/18 1856  AST 28 22 28   ALT 15 13 17   ALKPHOS 72 61 65  BILITOT 0.8 0.7 0.7  PROT 6.6 5.0* 5.4*  ALBUMIN 3.6 2.8* 2.9*   No results for input(s): LIPASE, AMYLASE in the last 168 hours. No results for input(s): AMMONIA in the last 168 hours. Coagulation Profile: No results for input(s): INR, PROTIME in the last 168 hours. Cardiac Enzymes: Recent Labs  Lab 06/26/18 1534 06/27/18 0803 06/27/18 1027 06/27/18 1344 06/27/18 1856  TROPONINI 0.10* 0.08* 0.07* 0.07* 0.07*   BNP (last 3 results) No results for input(s): PROBNP in the last 8760 hours. HbA1C: No results for input(s): HGBA1C in the last 72 hours. CBG: No results for input(s): GLUCAP in the last 168 hours. Lipid Profile: No results for input(s): CHOL, HDL, LDLCALC, TRIG, CHOLHDL, LDLDIRECT in the last 72 hours. Thyroid Function Tests: No results for input(s): TSH, T4TOTAL, FREET4, T3FREE, THYROIDAB in the last 72 hours. Anemia Panel: No results for input(s): VITAMINB12, FOLATE, FERRITIN, TIBC, IRON, RETICCTPCT in the last 72 hours. Sepsis Labs: No results for input(s): PROCALCITON, LATICACIDVEN in the last 168 hours.  Recent Results (from the past 240 hour(s))  SARS Coronavirus 2 [facility] order, Performed in [place] hospital lab)     Status: None   Collection Time: 06/26/18 12:06 PM  Result Value Ref Range Status   SARS Coronavirus 2 NEGATIVE NEGATIVE Final    Comment: (NOTE) If result is  NEGATIVE SARS-CoV-2 target nucleic acids are NOT DETECTED. The SARS-CoV-2 RNA is generally detectable in upper and lower  respiratory specimens during the acute phase of infection. The lowest  concentration of SARS-CoV-2 viral copies this assay can detect is 250  copies / mL. A negative result does not preclude SARS-CoV-2 infection  and should not be used as the sole basis for treatment or other  patient management decisions.  A negative result may occur with  improper specimen collection / handling, submission of specimen other  than nasopharyngeal  swab, presence of viral mutation(s) within the  areas targeted by this assay, and inadequate number of viral copies  (<250 copies / mL). A negative result must be combined with clinical  observations, patient history, and epidemiological information. If result is POSITIVE SARS-CoV-2 target nucleic acids are DETECTED. The SARS-CoV-2 RNA is generally detectable in upper and lower  respiratory specimens dur ing the acute phase of infection.  Positive  results are indicative of active infection with SARS-CoV-2.  Clinical  correlation with patient history and other diagnostic information is  necessary to determine patient infection status.  Positive results do  not rule out bacterial infection or co-infection with other viruses. If result is PRESUMPTIVE POSTIVE SARS-CoV-2 nucleic acids MAY BE PRESENT.   A presumptive positive result was obtained on the submitted specimen  and confirmed on repeat testing.  While 2019 novel coronavirus  (SARS-CoV-2) nucleic acids may be present in the submitted sample  additional confirmatory testing may be necessary for epidemiological  and / or clinical management purposes  to differentiate between  SARS-CoV-2 and other Sarbecovirus currently known to infect humans.  If clinically indicated additional testing with an alternate test  methodology [phone]) is advised. The SARS-CoV-2 RNA is generally  detectable  in upper and lower respiratory sp ecimens during the acute  phase of infection. The expected result is Negative. Fact Sheet for Patients:  StrictlyIdeas.no Fact Sheet for Healthcare Providers: BankingDealers.co.za This test is not yet approved or cleared by the [place] FDA and has been authorized for detection and/or diagnosis of SARS-CoV-2 by FDA under an Emergency Use Authorization (EUA).  This EUA will remain in effect (meaning this test can be used) for the duration of the COVID-19 declaration under Section 564(b)(1) of the Act, 21 U.S.C. section 360bbb-3(b)(1), unless the authorization is terminated or revoked sooner. Performed at [facility], [place] [address] [place], [place] [address]   Culture, blood (routine x 2) Call MD if unable to obtain prior to antibiotics being given     Status: None (Preliminary result)   Collection Time: 06/26/18 10:34 PM  Result Value Ref Range Status   Specimen Description BLOOD LEFT ARM  Final   Special Requests   Final    BOTTLES DRAWN AEROBIC ONLY Blood Culture adequate volume   Culture   Final    NO GROWTH 3 DAYS Performed at [facility], [place] [address] [place], [place] [address]    Report Status PENDING  Incomplete  Culture, blood (routine x 2) Call MD if unable to obtain prior to antibiotics being given     Status: None (Preliminary result)   Collection Time: 06/26/18 10:38 PM  Result Value Ref Range Status   Specimen Description BLOOD LEFT HAND  Final   Special Requests   Final    BOTTLES DRAWN AEROBIC AND ANAEROBIC Blood Culture adequate volume   Culture   Final    NO GROWTH 3 DAYS Performed at [facility], [place] [address] [place], [place] [address]    Report Status PENDING  Incomplete      Radiology Studies: Dg Chest Port 1 View  Result Date: 06/27/2018 CLINICAL DATA:  Current history of lymphoma with shortness of breath and cough. Follow-up effusions and  possible pneumonia. EXAM: PORTABLE CHEST 1 VIEW COMPARISON:  CTA chest yesterday. Chest x-rays yesterday and earlier. FINDINGS: Cardiac silhouette mildly enlarged, unchanged. Thoracic aorta ectatic and atherosclerotic, unchanged. Since yesterday, no change in the moderately large BILATERAL pleural effusions, LEFT greater  than RIGHT, and the airspace consolidation in the BILATERAL LOWER LOBES, RIGHT MIDDLE LOBE and lingula. Pulmonary vascularity normal. 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 IMPRESSION: 1. Stable moderately large BILATERAL pleural effusions, LEFT greater than RIGHT, and associated passive atelectasis and/or pneumonia in the BILATERAL LOWER LOBES, RIGHT MIDDLE LOBE and lingula. 2. Interstitial opacities throughout both lungs, unchanged since yesterday's examinations but new since 06/16/2018, query lymphangitic carcinomatosis. Interstitial edema is felt less likely since pulmonary vascularity is normal. Electronically Signed   By: Evangeline Dakin M.D.   On: 06/27/2018 19:11      LOS: 3 days   Time spent: More than 50% of that time was spent in counseling and/or coordination of care.  Antonieta Pert, MD [facility]  06/29/2018, 10:15 AM

## 2018-06-30 ENCOUNTER — Inpatient Hospital Stay (HOSPITAL_COMMUNITY): Payer: Medicare Other

## 2018-06-30 ENCOUNTER — Other Ambulatory Visit: Payer: Self-pay | Admitting: Hematology

## 2018-06-30 ENCOUNTER — Telehealth: Payer: Self-pay | Admitting: *Deleted

## 2018-06-30 DIAGNOSIS — D649 Anemia, unspecified: Secondary | ICD-10-CM

## 2018-06-30 DIAGNOSIS — R0902 Hypoxemia: Secondary | ICD-10-CM

## 2018-06-30 DIAGNOSIS — C8580 Other specified types of non-Hodgkin lymphoma, unspecified site: Secondary | ICD-10-CM

## 2018-06-30 LAB — CBC
HCT: 28.9 % — ABNORMAL LOW (ref 36.0–46.0)
Hemoglobin: 9.3 g/dL — ABNORMAL LOW (ref 12.0–15.0)
MCH: 25.5 pg — ABNORMAL LOW (ref 26.0–34.0)
MCHC: 32.2 g/dL (ref 30.0–36.0)
MCV: 79.4 fL — ABNORMAL LOW (ref 80.0–100.0)
Platelets: 290 10*3/uL (ref 150–400)
RBC: 3.64 MIL/uL — ABNORMAL LOW (ref 3.87–5.11)
RDW: 16 % — ABNORMAL HIGH (ref 11.5–15.5)
WBC: 9.2 10*3/uL (ref 4.0–10.5)
nRBC: 0 % (ref 0.0–0.2)

## 2018-06-30 LAB — BASIC METABOLIC PANEL
Anion gap: 9 (ref 5–15)
BUN: 26 mg/dL — ABNORMAL HIGH (ref 8–23)
CO2: 23 mmol/L (ref 22–32)
Calcium: 11.4 mg/dL — ABNORMAL HIGH (ref 8.9–10.3)
Chloride: 108 mmol/L (ref 98–111)
Creatinine, Ser: 1.04 mg/dL — ABNORMAL HIGH (ref 0.44–1.00)
GFR calc Af Amer: 59 mL/min — ABNORMAL LOW (ref >60)
GFR calc non Af Amer: 51 mL/min — ABNORMAL LOW (ref >60)
Glucose, Bld: 112 mg/dL — ABNORMAL HIGH (ref 70–99)
Potassium: 3.5 mmol/L (ref 3.5–5.1)
Sodium: 140 mmol/L (ref 135–145)

## 2018-06-30 LAB — PROCALCITONIN: Procalcitonin: <0.1 ng/mL

## 2018-06-30 MED ORDER — ALUM & MAG HYDROXIDE-SIMETH 200-200-20 MG/5ML PO SUSP
30.0000 mL | ORAL | Status: DC | PRN
  Filled 2018-06-30: qty 30

## 2018-06-30 MED ORDER — FUROSEMIDE 10 MG/ML IJ SOLN
20.0000 mg | Freq: Every day | INTRAMUSCULAR | Status: DC
  Administered 2018-07-01: 20 mg via INTRAVENOUS
  Filled 2018-06-30: qty 2

## 2018-06-30 MED ORDER — PREDNISONE 20 MG PO TABS
20.0000 mg | ORAL_TABLET | Freq: Every day | ORAL | Status: DC
  Administered 2018-07-01: 09:00:00 20 mg via ORAL
  Filled 2018-06-30: qty 1

## 2018-06-30 NOTE — Progress Notes (Signed)
Physical Therapy Treatment Patient Details Name: Amanda [name] MRN: [id number] DOB: 09/15/1940 Today's Date: 06/30/2018    History of Present Illness Pt is a 78 y.o. F with significant PMH of stage IV non Hodgkins lymphoma who presents with shortness of breath. Working diagnosis of HCAP    PT Comments    Pt is making good progress towards her goals today. Pt is mod I for bed mobility, supervision for transfers and ambulation and min guard for ascent/descent of 5 stairs. Pt will benefit from continued use of RW for stability until strength is fully regained. D/c plans remain appropriate. PT will continue to follow acutely.     Follow Up Recommendations  No PT follow up     Equipment Recommendations  None recommended by PT       Precautions / Restrictions Precautions Precautions: Fall Restrictions Weight Bearing Restrictions: No    Mobility  Bed Mobility Overal bed mobility: Modified Independent                Transfers Overall transfer level: Needs assistance Equipment used: None Transfers: Sit to/from Stand Sit to Stand: Supervision         General transfer comment: supervision for safety  Ambulation/Gait Ambulation/Gait assistance: Supervision Gait Distance (Feet): 300 Feet Assistive device: None Gait Pattern/deviations: Step-through pattern;Decreased stride length Gait velocity: decreased   General Gait Details: supervision for safety, vc for proximity to RW   Stairs Stairs: Yes Stairs assistance: Min guard Stair Management: One rail Right;Forwards;Alternating pattern;Step to pattern Number of Stairs: 5 General stair comments: min guard for safety, step over step to climb stairs and step to pattern to descend stairs       Balance Overall balance assessment: Mild deficits observed, not formally tested                                          Cognition Arousal/Alertness: Awake/alert Behavior During Therapy: WFL for tasks  assessed/performed Overall Cognitive Status: Within Functional Limits for tasks assessed                                           General Comments General comments (skin integrity, edema, etc.): Pt able to ambulate on RW with SaO2 >90%O2.       Pertinent Vitals/Pain Pain Assessment: No/denies pain           PT Goals (current goals can now be found in the care plan section) Acute Rehab PT Goals Patient Stated Goal: regain strength PT Goal Formulation: With patient Time For Goal Achievement: 07/12/18 Potential to Achieve Goals: Good Progress towards PT goals: Progressing toward goals    Frequency    Min 3X/week      PT Plan Current plan remains appropriate       AM-PAC PT "6 Clicks" Mobility   Outcome Measure  Help needed turning from your back to your side while in a flat bed without using bedrails?: None Help needed moving from lying on your back to sitting on the side of a flat bed without using bedrails?: None Help needed moving to and from a bed to a chair (including a wheelchair)?: None Help needed standing up from a chair using your arms (e.g., wheelchair or bedside chair)?: None Help needed to walk in hospital room?: None  Help needed climbing 3-5 steps with a railing? : A Little 6 Click Score: 23    End of Session   Activity Tolerance: Patient tolerated treatment well Patient left: in chair;with call bell/phone within reach;with chair alarm set Nurse Communication: Mobility status PT Visit Diagnosis: Unsteadiness on feet (R26.81)     Time: 3568-6168 PT Time Calculation (min) (ACUTE ONLY): 32 min  Charges:  $Gait Training: 23-37 mins                     Jed Kutch B. Migdalia Dk PT, DPT Acute Rehabilitation Services Pager [phone] Office [phone]    [place] 06/30/2018, 4:14 PM

## 2018-06-30 NOTE — Progress Notes (Unsigned)
lo

## 2018-06-30 NOTE — Progress Notes (Signed)
Amanda [name]   DOB:1940-06-06   YT#:[id number]    HEME/ONC OVERVIEW: 1.Stage IV NHL, favoring nodal marginal zone lymphoma -05/2018: ? CT chest showed pathologic lymphadenopathy involving bilateral axilla, mediastinum as well as multiple small lung nodules and possible splenomegaly ? PET showed FDG-avid lymphadenopathy in neck, chest, abdomen and pelvis, questionable involvement of the lung, and focal involvement of left scapula and acetabulum  ? Left axillary LN excisional bx: NHL, favoring low-grade marginal zone lymphoma; Cyclin-D1 neg ([facility])  TREATMENT REGIMEN:  TBD   ASSESSMENT & PLAN:  Acute hypoxic respiratory failure -Overall improving with diuresis -I personally reviewed the radiologic images of chest x-ray this morning, which showed some interval improvement in the right lung opacity since starting diuresis yesterday -Clinically, patient reports some improvement in her breathing, but is still having some audible wheezing, partly from the upper airway  -I appreciate assistance from pulmonary medicine -I suspect some of the opacities in the lung may be from volume overload from aggressive hydration for hypercalcemia during the previous hospitalization -However, if there is persistent pleural effusion, it would be helpful to get a sample for flow cytometry to rule out lymphoma involvement -I will defer further management to the hospitalist and pulmonary teams   Stage IV NHL, favoring nodal marginal zone lymphoma  -I discussed the case at length with the patient's spouse, and separately, their daughters, about the diagnosis, treatment options and prognosis -I informed them that the treatment decision is evolving due to the change in the patient's clinical condition, including new onset hypercalcemia and worsening dyspnea after the recent lymphoma diagnosis -Given that the patient's spouse has some reservation about her treatment plan, I have recommended them to seek a second  opinion to ensure that they feel comfortable with the chemotherapy regimen  -I discussed the case in detail with Dr. Jolayne Haines at [facility], who kindly agreed to see the patient after discharge -Prior to discharge, I would recommend placing port to facilitate outpatient treatment    Recurrent hypercalcemia -Ca 11.4 today, slightly higher than yesterday -We will monitor it for now; avoid calcium supplementation -If Ca continues to rise, we can consider giving one dose of bisphosphonate to prevent recurrent hypercalcemia   Microcytic anemia -Hgb 9.3 today, stable -Patient denies any symptoms of bleeding -We will monitor it for now   Thank you for caring for our mutual patient.  Please do not hesitate contact me if there are any questions.  Tish Men, MD 06/30/2018  9:59 AM   Subjective:  Amanda [name] reports that her breathing has improved since starting diuresis yesterday.  She feels that she can "catch my breath every other breath."  She still has some audible wheezing, which she thought was coming from her upper airway.  She denies any other complaint this morning.  ROS: Constitutional: ( - ) fevers, ( - )  chills , ( - ) night sweats Ears, nose, mouth, throat, and face: ( - ) mucositis, ( - ) sore throat Respiratory: ( - ) cough, ( + ) dyspnea, ( + ) wheezes Cardiovascular: ( - ) palpitation, ( - ) chest discomfort, ( - ) lower extremity swelling Gastrointestinal:  ( - ) nausea, ( - ) heartburn, ( - ) change in bowel habits Skin: ( - ) abnormal skin rashes Behavioral/Psych: ( - ) mood change, ( - ) new changes  All other systems were reviewed with the patient and are negative.  Objective:  Vitals:   06/30/18 0533 06/30/18 0855  BP: [name])  151/73   Pulse: 78 72  Resp: (!) 22 20  Temp: (!) 97.5 F (36.4 C)   SpO2: 94% 96%     Intake/Output Summary (Last 24 hours) at 06/30/2018 0959 Last data filed at 06/30/2018 3810 Gross per 24 hour  Intake 2005.72 ml  Output 2300 ml  Net  -294.28 ml    GENERAL: alert, no distress and comfortable SKIN: skin color, texture, turgor are normal, no rashes or significant lesions EYES: conjunctiva are pink and non-injected, sclera clear OROPHARYNX: no exudate, no erythema; lips, buccal mucosa, and tongue normal  NECK: supple, non-tender LUNGS: wheezing transmitting from the upper airway to the lungs, no rhonchi or rales  HEART: regular rate & rhythm and no murmurs and no lower extremity edema ABDOMEN: soft, non-tender, non-distended, normal bowel sounds PSYCH: alert & oriented x 3, fluent speech   Labs:  Lab Results  Component Value Date   WBC 9.2 06/30/2018   HGB 9.3 (L) 06/30/2018   HCT 28.9 (L) 06/30/2018   MCV 79.4 (L) 06/30/2018   PLT 290 06/30/2018   NEUTROABS 5.0 06/26/2018    Lab Results  Component Value Date   NA 140 06/30/2018   K 3.5 06/30/2018   CL 108 06/30/2018   CO2 23 06/30/2018    Studies:  Dg Chest 2 View  Result Date: 06/30/2018 CLINICAL DATA:  SHORTNESS OF BREATH.  COUGH.  PLEURAL EFFUSIONS. EXAM: CHEST - 2 VIEW COMPARISON:  TWO-VIEW CHEST X-RAY 06/27/2018 FINDINGS: Heart is enlarged. Left greater than right pleural effusions and basilar airspace disease are again seen. Aeration is improving. Mild pulmonary vascular congestion is present as well. IMPRESSION: 1. Improving aeration of both lungs. 2. Persistent pleural effusions and airspace disease, left greater than right. This remains concerning for infection. Electronically Signed   By: San Morelle M.D.   On: 06/30/2018 08:52

## 2018-06-30 NOTE — Progress Notes (Signed)
Pt transported to x-ray on cardiac monitor. Vitals WDL. Mask placed on patient prior to transport. CCM called and notified.

## 2018-06-30 NOTE — Progress Notes (Signed)
NAME:  Amanda [name], MRN:  [id number], DOB:  1940/07/03, LOS: 4 ADMISSION DATE:  06/26/2018, CONSULTATION DATE:  06/29/18 REFERRING MD:  Maylon Peppers - oncology , CHIEF COMPLAINT:  Dyspnea   Brief History   78 year old F with stage IV NH Lymphoma, worsening bilatearl pleural effusions, intermittent shortness of breath. PCCM asked to evaluate  History of present illness   78 yo F with PMH stage IV NH Lymphoma, hypothyroidism, Recent PNA, Bronchiectasis, OA, HLD, GERD who presented to ED 4/24 with progressive shortness of breath. Associated symptoms include non-productive cough. Initially, dyspnea was limited to exertion but progressed to mild dyspnea at rest.  Of note patient was recently hospitalized 4/14-4/18vfor progression of cough and shortness of brath, and was treated with rocephin empirically. In ED patient CXR revealed multifocal opacities and patient was admitted to hospital for HCAP.  CTA chest reveals no PE but does show progression of lymphoma and bilateral pleural effusions.   Past Medical History   Anemia    . Arthritis   . Cancer ([place])   . Constipation, chronic   . GERD (gastroesophageal reflux disease)    zantac  . Heart murmur   . History of blood transfusion [place]  . Hyperlipidemia   . Hypothyroidism   . Lymphoproliferative disorder ([place])   . Macular degeneration 2013   Both eyes   . Osteopenia   . Pneumonia   . PONV (postoperative nausea and vomiting)    needs little anesthesia  . Shingles   . Shortness of breath    on exertion  . Spleen enlarged   . SUI (stress urinary incontinence, female)   . Wears glasses      Significant Hospital Events   4/24> admitted  4/27> PCCM consulted given bilateral pleural effusions of unknown etiology   Consults:  Oncology PCCM Procedures:    Significant Diagnostic Tests:  CT angio chest 4/24> No PE. Progression of thoracic adenopathy suggestive of worsening lymphoma. Bilateral pleural  effusions, increased from prior.   Micro Data:  SARS-CoV-2 4/24> not detected BCx 4/24> NGTD Sputum cx 4/24>>> RVP 4/27>>>  Antimicrobials:  vanc 4/24>>> Cefepime 4/24>>>   Interim history/subjective:  Pt has been off oxygen since 4/27 States she feels better, CXR with improving aeration Has a bit of a cough>> non-productive She is net negative 700 cc's Patient adds "We really should get a second opinion for the cancer."  Currently scheduled for follow [facility] next Monday Is anxious to be discharged, but needs port a cath placement one Pulmonary gives ok per patient   Objective   Blood pressure (!) 151/73, pulse 72, temperature (!) 97.5 F (36.4 C), temperature source Oral, resp. rate 20, height 5\' 1"  (1.549 m), weight 66.2 kg, SpO2 96 %.        Intake/Output Summary (Last 24 hours) at 06/30/2018 0943 Last data filed at 06/30/2018 0553 Gross per 24 hour  Intake 2005.72 ml  Output 2300 ml  Net -294.28 ml   Filed Weights   06/26/18 1131 06/27/18 0620 06/29/18 0444  Weight: 64 kg 64.2 kg 66.2 kg    Examination: General: WDWN elderly female, seated in bed, NAD, on RA, speaking in full sentences HENT: NCAT, pink mmm, trachea midline, anicteric sclera,No JVD   Lungs: Bilateral chest excursion, CTA bilaterally (anteriorly, laterally, posteriorly). No accessory muscle recruitment on room air. Diminished per bases Cardiovascular: s1s2, RRR,  no r/g/m. No JVD. 2+ radial pulses  Abdomen: Soft, round, ndnt, normoactive x4  Extremities: Symmetrical bulk and tone, no clubbing, no cyanosis, no obvious deformities.  Neuro: AAOx4. PERRL. Following commands. No focal deficits  Skin: clean, dry, warm, without rash GU: [facility] Problem list     Assessment & Plan:   Stage IV  NH Lymphoma -Favoring nodal marginal zone lymphoma P -Oncology following -Given hypercalcemia and interval thoracic lymph node enlargement oncology is considering systemic treatment -  She is for second opinion at [facility] next week   Acute respiratory insufficiency with hypoxia, improved after treatment with Lasix  -Bilateral pleural effusion on CTA  4/24  -CTA negative for PE 4/24  -COVID-19 not detected (4/24) -?HCAP  - PCT < 0.10 P - Patient is stable on room air at this time, without complaint of SOB.Slight improvement in CXR. Net negative 700 cc's after lasix.  Contemplating utility of inpatient diagnostic thora vs. outpatient follow up. PCT < 0.10, afebrile . Per oncology note, oncology would like evaluation of pleural fluid etiology prior to initiating systemic therapy.   - Currently SpO2 95% on RA, can use supplemental O2 PRN for goal SpO2 >92 - Consider discontinuing  vanc/cefepime  - Lasix 20 given 4/28 am, consider additional dosing as needed per primary team - Additional CXR 4/29 am - Will replete potassium - Follow up sputum culture>> RVP negative - Send RVP  - IS q1hr  - OOB to chair, PT/OT - pulmicort, duoneb BID, PRN albuterol  - Continue Flonase and nasal saline for upper airway cough/ wheeze - Will need outpatient follow up with CXR at discharge  to ensure resolution vs chronic recurrance>> Need for Thora at later date   Rest per primary   Best practice:  Diet: Regular Pain/Anxiety/Delirium protocol (if indicated): APAP DVT prophylaxis: lovenox  GI prophylaxis: pepcid  Mobility: PT/OT  Code Status: Full  Family Communication: Husband, lonnie, updated via phone 06/30/2018 Disposition: Continue current level of care   Labs   CBC: Recent Labs  Lab 06/26/18 1147 06/26/18 2234 06/28/18 0313 06/29/18 0449 06/30/18 0348  WBC 8.1 7.4 5.3 6.3 9.2  NEUTROABS 5.0  --   --   --   --   HGB 11.6* 11.0* 10.2* 9.3* 9.3*  HCT 36.1 32.7* 30.7* 28.5* 28.9*  MCV 80.9 78.4* 78.9* 79.4* 79.4*  PLT 247 237 234 256 161    Basic Metabolic Panel: Recent Labs  Lab 06/27/18 0450 06/27/18 1856 06/28/18 0313 06/29/18 0449 06/30/18 0348  NA 136  137 137 140 140  K 3.9 3.7 3.8 3.5 3.5  CL 101 105 105 110 108  CO2 23 20* 20* 21* 23  GLUCOSE 87 110* 148* 180* 112*  BUN 13 12 12 20  26*  CREATININE 1.32* 1.12* 1.10* 1.07* 1.04*  CALCIUM 10.2 10.2 10.0 10.3 11.4*   GFR: Estimated Creatinine Clearance: 38.2 mL/min (A) (by C-G formula based on SCr of 1.04 mg/dL (H)). Recent Labs  Lab 06/26/18 2234 06/28/18 0313 06/29/18 0449 06/30/18 0348  PROCALCITON  --   --   --  <0.10  WBC 7.4 5.3 6.3 9.2    Liver Function Tests: Recent Labs  Lab 06/26/18 1147 06/27/18 0450 06/27/18 1856  AST 28 22 28   ALT 15 13 17   ALKPHOS 72 61 65  BILITOT 0.8 0.7 0.7  PROT 6.6 5.0* 5.4*  ALBUMIN 3.6 2.8* 2.9*   No results for input(s): LIPASE, AMYLASE in the last 168 hours. No results for input(s): AMMONIA in the last 168 hours.  ABG    Component Value  Date/Time   PHART 7.458 (H) 06/27/2018 2050   PCO2ART 34.5 06/27/2018 2050   PO2ART 77.1 (L) 06/27/2018 2050   HCO3 24.1 06/27/2018 2050   O2SAT 95.9 06/27/2018 2050     Coagulation Profile: No results for input(s): INR, PROTIME in the last 168 hours.  Cardiac Enzymes: Recent Labs  Lab 06/26/18 1534 06/27/18 0803 06/27/18 1027 06/27/18 1344 06/27/18 1856  TROPONINI 0.10* 0.08* 0.07* 0.07* 0.07*    HbA1C: Hgb A1c MFr Bld  Date/Time Value Ref Range Status  11/18/2014 12:57 AM 6.1 (H) 4.8 - 5.6 % Final    Comment:    (NOTE)         Pre-diabetes: 5.7 - 6.4         Diabetes: >6.4         Glycemic control for adults with diabetes: <7.0   11/29/2011 03:51 PM 5.8 4.6 - 6.5 % Final    Comment:    Glycemic Control Guidelines for People with Diabetes:Non Diabetic:  <6%Goal of Therapy: <7%Additional Action Suggested:  >8%     CBG: No results for input(s): GLUCAP in the last 168 hours.  Review of Systems:   Denies fever, chills, night sweats Endorses cough, shortness of breath.  Denies chest pain/pressure, denies palpitation, denies lower extremity edema Denies n/v/d, denies  heartburn  Denies rash, pruritis    Past Medical History  She,  has a past medical history of Anemia, Arthritis, Cancer ([place]), Constipation, chronic, GERD (gastroesophageal reflux disease), Heart murmur, History of blood transfusion (1959), Hyperlipidemia, Hypothyroidism, Lymphoproliferative disorder ([place]), Macular degeneration (2013), Osteopenia, Pneumonia, PONV (postoperative nausea and vomiting), Shingles, Shortness of breath, Spleen enlarged, SUI (stress urinary incontinence, female), and Wears glasses.   Surgical History    Past Surgical History:  Procedure Laterality Date  . BREAST EXCISIONAL BIOPSY Left 1980  . CARPAL TUNNEL RELEASE  1999  . CATARACT EXTRACTION  2009, 2011   BOTH EYES  . [place]  . COLONOSCOPY      Dr Cristina Gong  . DILATION AND CURETTAGE OF UTERUS     X2  . HYSTEROSCOPY W/D&C  01/07/2012   Procedure: DILATATION AND CURETTAGE /HYSTEROSCOPY;  Surgeon: Terrance Mass, MD;  Location: [facility];  Service: Gynecology;  Laterality: N/A;  intrauterine foley catheter for tamponode   . LYMPH NODE BIOPSY Left 05/26/2018   Procedure: LEFT AXILLARY LYMPH NODE BIOPSY;  Surgeon: Fanny Skates, MD;  Location: [facility];  Service: General;  Laterality: Left;  . TONSILLECTOMY    . TONSILLECTOMY AND ADENOIDECTOMY    . TUBAL LIGATION     BY LAPAROSCOPY  . WISDOM TOOTH EXTRACTION       Social History   reports that she has never smoked. She has never used smokeless tobacco. She reports current alcohol use. She reports that she does not use drugs.   Family History   Her family history includes Breast cancer (age of onset: 47) in her mother; COPD in her paternal grandfather; Diabetes in her father; Hyperlipidemia in her brother; Hypertension in her father; Kidney failure in her father; Ovarian cancer in her mother; Prostate cancer in her father; Stroke in her maternal grandfather. There is no history of Hypercalcemia.   Allergies Allergies  Allergen Reactions  .  Phenergan [Promethazine Hcl] Other (See Comments)    Jerking/agitation  . Levofloxacin Nausea And Vomiting  . Pravastatin Sodium Nausea And Vomiting     Home Medications  Prior to Admission medications   Medication Sig Start Date End Date Taking?  Authorizing Provider  acetaminophen (TYLENOL) 500 MG tablet Take 1,000 mg by mouth every 6 (six) hours as needed for mild pain.   Yes [provider]  albuterol (PROVENTIL) (2.5 MG/3ML) 0.083% nebulizer solution Take 3 mLs (2.5 mg total) by nebulization every 6 (six) hours as needed for wheezing or shortness of breath. 04/24/18  Yes Carollee Herter, Yvonne R, DO  amLODipine (NORVASC) 5 MG tablet Take 1 tablet (5 mg total) by mouth daily with breakfast. 06/21/18  Yes Mariel Aloe, MD  buPROPion (WELLBUTRIN XL) 150 MG 24 hr tablet TAKE 1 TABLET BY MOUTH EVERY DAY 02/10/18  Yes Roma Schanz R, DO  famotidine (PEPCID) 20 MG tablet One at bedtime Patient taking differently: Take 20 mg by mouth at bedtime. One at bedtime  06/05/18  Yes Tanda Rockers, MD  feeding supplement, ENSURE ENLIVE, (ENSURE ENLIVE) LIQD Take 237 mLs by mouth 2 (two) times daily between meals. 06/20/18  Yes Mariel Aloe, MD  levothyroxine (SYNTHROID, LEVOTHROID) 125 MCG tablet TAKE 1 TABLET BY MOUTH DAILY 05/15/18  Yes Roma Schanz R, DO  omeprazole (PRILOSEC) 20 MG capsule Take 2 x 30 min before breakfast Patient taking differently: Take 20 mg by mouth daily.  06/05/18  Yes Tanda Rockers, MD  ondansetron (ZOFRAN) 4 MG tablet Take 1 tablet (4 mg total) by mouth every 8 (eight) hours as needed for nausea or vomiting. 06/08/18  Yes Roma Schanz R, DO  albuterol (PROVENTIL HFA;VENTOLIN HFA) 108 (90 Base) MCG/ACT inhaler Inhale 2 puffs into the lungs every 6 (six) hours as needed for up to 30 days for wheezing or shortness of breath. 05/22/18 06/21/18  Tish Men, MD  potassium chloride 20 MEQ TBCR Take 20 mEq by mouth daily for 3 days. 06/21/18 06/24/18  Mariel Aloe, MD     Magdalen Spatz, MSN, AGACNP-BC [place] [phone] If no answer, [phone] 06/30/2018, 9:43 AM

## 2018-06-30 NOTE — Telephone Encounter (Signed)
Received Physician Orders from [place]; forwarded to provider/SLS 04/28

## 2018-06-30 NOTE — Progress Notes (Addendum)
PROGRESS NOTE  Amanda [name]  FWY:[id number] DOB: Feb 04, 1941 DOA: 06/26/2018 PCP: Amanda Held, DO   Brief Narrative: [phone] w/ Hx stage IV NHLrecent ADMIT for low grade fever- neg covid, aki, hypercalcemia (06/16/2018 to  06/20/2018), p/w to [facility] with shortness of breath and new oxygen requirement., DOE and at rest, cough but no fever/no wheezing, no sick contacts.Seen by oncologist likely not from [place]. Now in  ER w Rf 39 oxygen sat 88%  RA, WBC 8.1 creat 1.21.  Calcium is 11.0.  Troponin is 0.1 and glucose 99.  Urinalysis is negative.  Chest x-ray showed bilateral basal thickening with some pleural effusion.  BNP 171.  CTA NO PE but Extensive thoracic adenopathy mildly progressive from previous  CT last month.Worsening of lymphoma.  Splenomegaly and upper abdomen adenopathy are grossly stable.  Enlarging bilateral pleural effusions with worsening airspace opacities in both lower lobes the right middle lobe and lingula. Patern is  nonspecific but could be seen in the secondary to superimposed atelectasis,infection or edema.COVID-19 testing was negative. Pt was started on broad spectrumfor antibiotics and was admitted.  Patient was also initially placed on IV fluids for hypercalcemia, given respiratory status pleural effusion started on diuresis 4/27 with respiratory symptoms improving.  Subjective: This morning feeling much better after Lasix.  Off oxygen, on room air. Has been afebrile with normal WBC count. Denies chest pain nausea vomiting.  Assessment & Plan:   Air space opacity noted in the CT scan with bilateral pleural effusions, possible HCAP:Has been on empiric-vanco/cefepime.Strep Ag/legionella neg, COVID 19 neg. blood culture NGTD. rocalcitonin this am <0.1, so we will stop antibiotics and watch off antibitiocs.  Shortness of breath/dyspnea on exertion with acute respiratory failure with hypoxia due to #1, history of bronchiectasis.no PE.seen by pulmonary, trying Diuresis and  repeating chest x-ray with improvement in aeration. Continue on Lasix. Taper steroid, was placed for wheezing.Appreciate pulmonary input.  Discussed w Amanda [name].  If has recurrent issues will need thoracentesis down the line and will need follow-up chest x-ray as outpatient.  Non-Hodgkin's lymphoma, stage IV followed by Dr Amanda [name]. appreciate inputs. Noted worsening adenopathy.  Per oncology treatment decision is evolving due to change in patient's clinical condition, at this time patient is planning to seek second opinion at [facility] upon discharge.  Hypercalcemia, in the setting of NHL , recently seen by endocrinology Dr Amanda [name] 4/23, 25 vit d was low- avoiding supplement and calcium, pth was low. If persistent Endocrine advised cinacalcet. ca is overall stable-but up at 11.4 today-watch,need close follow up.  Hypothyroidism : Continue her Synthroid  HTN: Stable on amlodipine  Mildy elevated troponin:suspect demand mismatch in the setting of #1, troponin 0 0.06->0.1->0.08>0.07   Patient denied chest pain. EKG-no overt signs of ischemia, Old anterior infarct. Obtaiend limited echo and has normal EF with no acute finding.  Constipation: continue Laxative as needed.  DVT prophylaxis: lovenox Code Status: wishes to be full code Family Communication: discussed with patient in detail.  Discussed with patient's husband over the phone   4/26, 4/27. Pt aware abt plan. Disposition Plan: remains inpatient pending clinical improvement. If continues to improve anticipate discharge in next 24 hours  Consultants:  Oncology Amanda [name]  Procedures: 4/25 Limited echo Technically difficult  1. The left ventricle has normal systolic function, with an ejection fraction of 60-65%. The cavity size was normal. There is moderately increased left ventricular wall thickness.  2. The right ventricle has normal systolc function. The cavity was normal. There  is no increase in right ventricular wall thickness.  3. Large pleural  effusion.  CTA Extensive thoracic adenopathy mildly progressive from previous  CT last month.Worsening of lymphoma.  Splenomegaly and upper abdomen adenopathy are grossly stable.  Enlarging bilateral pleural effusions with worsening airspace opacities in both lower lobes the right middle lobe and lingula. Patern is  nonspecific but could be seen in the secondary to superimposed atelectasis,infection or edema.  Antimicrobials: Anti-infectives (From admission, onward)   Start     Dose/Rate Route Frequency Ordered Stop   06/28/18 1600  vancomycin (VANCOCIN) 1,250 mg in sodium chloride 0.9 % 250 mL IVPB  Status:  Discontinued     1,250 mg 166.7 mL/hr over 90 Minutes Intravenous Every 48 hours 06/26/18 2205 06/28/18 1023   06/28/18 1030  vancomycin (VANCOCIN) IVPB 1000 mg/200 mL premix  Status:  Discontinued     1,000 mg 200 mL/hr over 60 Minutes Intravenous Every 36 hours 06/28/18 1023 06/30/18 1105   06/26/18 2215  ceFEPIme (MAXIPIME) 2 g in sodium chloride 0.9 % 100 mL IVPB  Status:  Discontinued     2 g 200 mL/hr over 30 Minutes Intravenous Every 12 hours 06/26/18 2153 06/30/18 1105   06/26/18 1330  vancomycin (VANCOCIN) IVPB 1000 mg/200 mL premix     1,000 mg 200 mL/hr over 60 Minutes Intravenous  Once 06/26/18 1327 06/26/18 1618   06/26/18 1330  piperacillin-tazobactam (ZOSYN) IVPB 3.375 g     3.375 g 12.5 mL/hr over 240 Minutes Intravenous  Once 06/26/18 1327 06/26/18 1455       Objective: Vitals:   06/29/18 2108 06/30/18 0100 06/30/18 0533 06/30/18 0855  BP:  139/63 (!) 151/73   Pulse:  72 78 72  Resp:  20 (!) 22 20  Temp:  97.7 F (36.5 C) (!) 97.5 F (36.4 C)   TempSrc:  Oral Oral   SpO2: 96% 93% 94% 96%  Weight:      Height:        Intake/Output Summary (Last 24 hours) at 06/30/2018 1107 Last data filed at 06/30/2018 1030 Gross per 24 hour  Intake 2005.72 ml  Output 3100 ml  Net -1094.28 ml   Filed Weights   06/26/18 1131 06/27/18 0620 06/29/18 0444  Weight: 64  kg 64.2 kg 66.2 kg   Weight change:   Body mass index is 27.57 kg/m.  Intake/Output from previous day: 04/27 0701 - 04/28 0700 In: 2005.7 [P.O.:120; I.V.:643; IV Piggyback:1242.7] Out: 2300 [Urine:2300] Intake/Output this shift: Total I/O In: -  Out: 800 [Urine:800]  Examination: General exam: Calm, comfortable, not in acute distress, older for age, average built.  HEENT:Oral mucosa moist, Ear/Nose WNL grossly, dentition normal. Respiratory system: Bilateral equal air entry, no crackles and wheezing, no use of accessory muscle Cardiovascular system: regular rate and rhythm, S1 & S2 heard, No JVD/murmurs. Gastrointestinal system: Abdomen soft, non-tender, non-distended, BS +. No hepatosplenomegaly palpable. Nervous System:Alert, awake and oriented at baseline. Able to move UE and LE, sensation intact. Extremities: No edema, distal peripheral pulses palpable.  Skin: No rashes,no icterus. MSK: Normal muscle bulk,tone, power  Medications:  Scheduled Meds: . amLODipine  5 mg Oral Q breakfast  . aspirin EC  81 mg Oral Daily  . budesonide (PULMICORT) nebulizer solution  0.25 mg Nebulization BID  . buPROPion  150 mg Oral Daily  . docusate sodium  100 mg Oral BID  . enoxaparin (LOVENOX) injection  40 mg Subcutaneous Q24H  . famotidine  20 mg Oral QHS  . feeding  supplement (ENSURE ENLIVE)  237 mL Oral BID BM  . fluticasone  1 spray Each Nare Daily  . ipratropium-albuterol  3 mL Nebulization BID  . levothyroxine  125 mcg Oral Q0600  . mouth rinse  15 mL Mouth Rinse BID  . pantoprazole  40 mg Oral Daily  . potassium chloride  30 mEq Oral Daily  . [START ON 07/01/2018] predniSONE  20 mg Oral Q breakfast  . sodium chloride  2 spray Each Nare BID   Continuous Infusions: . sodium chloride Stopped (06/26/18 1343)    Data Reviewed: I have personally reviewed following labs and imaging studies  CBC: Recent Labs  Lab 06/26/18 1147 06/26/18 2234 06/28/18 0313 06/29/18 0449  06/30/18 0348  WBC 8.1 7.4 5.3 6.3 9.2  NEUTROABS 5.0  --   --   --   --   HGB 11.6* 11.0* 10.2* 9.3* 9.3*  HCT 36.1 32.7* 30.7* 28.5* 28.9*  MCV 80.9 78.4* 78.9* 79.4* 79.4*  PLT 247 237 234 256 062   Basic Metabolic Panel: Recent Labs  Lab 06/27/18 0450 06/27/18 1856 06/28/18 0313 06/29/18 0449 06/30/18 0348  NA 136 137 137 140 140  K 3.9 3.7 3.8 3.5 3.5  CL 101 105 105 110 108  CO2 23 20* 20* 21* 23  GLUCOSE 87 110* 148* 180* 112*  BUN 13 12 12 20  26*  CREATININE 1.32* 1.12* 1.10* 1.07* 1.04*  CALCIUM 10.2 10.2 10.0 10.3 11.4*   GFR: Estimated Creatinine Clearance: 38.2 mL/min (A) (by C-G formula based on SCr of 1.04 mg/dL (H)). Liver Function Tests: Recent Labs  Lab 06/26/18 1147 06/27/18 0450 06/27/18 1856  AST 28 22 28   ALT 15 13 17   ALKPHOS 72 61 65  BILITOT 0.8 0.7 0.7  PROT 6.6 5.0* 5.4*  ALBUMIN 3.6 2.8* 2.9*   No results for input(s): LIPASE, AMYLASE in the last 168 hours. No results for input(s): AMMONIA in the last 168 hours. Coagulation Profile: No results for input(s): INR, PROTIME in the last 168 hours. Cardiac Enzymes: Recent Labs  Lab 06/26/18 1534 06/27/18 0803 06/27/18 1027 06/27/18 1344 06/27/18 1856  TROPONINI 0.10* 0.08* 0.07* 0.07* 0.07*   BNP (last 3 results) No results for input(s): PROBNP in the last 8760 hours. HbA1C: No results for input(s): HGBA1C in the last 72 hours. CBG: No results for input(s): GLUCAP in the last 168 hours. Lipid Profile: No results for input(s): CHOL, HDL, LDLCALC, TRIG, CHOLHDL, LDLDIRECT in the last 72 hours. Thyroid Function Tests: No results for input(s): TSH, T4TOTAL, FREET4, T3FREE, THYROIDAB in the last 72 hours. Anemia Panel: No results for input(s): VITAMINB12, FOLATE, FERRITIN, TIBC, IRON, RETICCTPCT in the last 72 hours. Sepsis Labs: Recent Labs  Lab 06/30/18 0348  PROCALCITON <0.10    Recent Results (from the past 240 hour(s))  SARS Coronavirus 2 [facility] order, Performed in  [facility] hospital lab)     Status: None   Collection Time: 06/26/18 12:06 PM  Result Value Ref Range Status   SARS Coronavirus 2 NEGATIVE NEGATIVE Final    Comment: (NOTE) If result is NEGATIVE SARS-CoV-2 target nucleic acids are NOT DETECTED. The SARS-CoV-2 RNA is generally detectable in upper and lower  respiratory specimens during the acute phase of infection. The lowest  concentration of SARS-CoV-2 viral copies this assay can detect is 250  copies / mL. A negative result does not preclude SARS-CoV-2 infection  and should not be used as the sole basis for treatment or other  patient management decisions.  A negative result may occur with  improper specimen collection / handling, submission of specimen other  than nasopharyngeal swab, presence of viral mutation(s) within the  areas targeted by this assay, and inadequate number of viral copies  (<250 copies / mL). A negative result must be combined with clinical  observations, patient history, and epidemiological information. If result is POSITIVE SARS-CoV-2 target nucleic acids are DETECTED. The SARS-CoV-2 RNA is generally detectable in upper and lower  respiratory specimens dur ing the acute phase of infection.  Positive  results are indicative of active infection with SARS-CoV-2.  Clinical  correlation with patient history and other diagnostic information is  necessary to determine patient infection status.  Positive results do  not rule out bacterial infection or co-infection with other viruses. If result is PRESUMPTIVE POSTIVE SARS-CoV-2 nucleic acids MAY BE PRESENT.   A presumptive positive result was obtained on the submitted specimen  and confirmed on repeat testing.  While 2019 novel coronavirus  (SARS-CoV-2) nucleic acids may be present in the submitted sample  additional confirmatory testing may be necessary for epidemiological  and / or clinical management purposes  to differentiate between  SARS-CoV-2 and other  Sarbecovirus currently known to infect humans.  If clinically indicated additional testing with an alternate test  methodology [phone]) is advised. The SARS-CoV-2 RNA is generally  detectable in upper and lower respiratory sp ecimens during the acute  phase of infection. The expected result is Negative. Fact Sheet for Patients:  StrictlyIdeas.no Fact Sheet for Healthcare Providers: BankingDealers.co.za This test is not yet approved or cleared by the [place] FDA and has been authorized for detection and/or diagnosis of SARS-CoV-2 by FDA under an Emergency Use Authorization (EUA).  This EUA will remain in effect (meaning this test can be used) for the duration of the COVID-19 declaration under Section 564(b)(1) of the Act, 21 U.S.C. section 360bbb-3(b)(1), unless the authorization is terminated or revoked sooner. Performed at [facility], [place] [address], [place], [place] [address]   Culture, blood (routine x 2) Call MD if unable to obtain prior to antibiotics being given     Status: None (Preliminary result)   Collection Time: 06/26/18 10:34 PM  Result Value Ref Range Status   Specimen Description BLOOD LEFT ARM  Final   Special Requests   Final    BOTTLES DRAWN AEROBIC ONLY Blood Culture adequate volume   Culture   Final    NO GROWTH 4 DAYS Performed at [facility], [place] [address] [place], [place] [address]    Report Status PENDING  Incomplete  Culture, blood (routine x 2) Call MD if unable to obtain prior to antibiotics being given     Status: None (Preliminary result)   Collection Time: 06/26/18 10:38 PM  Result Value Ref Range Status   Specimen Description BLOOD LEFT HAND  Final   Special Requests   Final    BOTTLES DRAWN AEROBIC AND ANAEROBIC Blood Culture adequate volume   Culture   Final    NO GROWTH 4 DAYS Performed at [facility], [place] [address], [place], [place] [address]    Report Status  PENDING  Incomplete  Respiratory Panel by PCR     Status: None   Collection Time: 06/29/18  3:00 PM  Result Value Ref Range Status   Adenovirus NOT DETECTED NOT DETECTED Final   Coronavirus 229E NOT DETECTED NOT DETECTED Final    Comment: (NOTE) The Coronavirus on the Respiratory Panel, DOES NOT  test for the novel  Coronavirus (2019 nCoV)    Coronavirus HKU1 NOT DETECTED NOT DETECTED Final   Coronavirus NL63 NOT DETECTED NOT DETECTED Final   Coronavirus OC43 NOT DETECTED NOT DETECTED Final   Metapneumovirus NOT DETECTED NOT DETECTED Final   Rhinovirus / Enterovirus NOT DETECTED NOT DETECTED Final   Influenza A NOT DETECTED NOT DETECTED Final   Influenza B NOT DETECTED NOT DETECTED Final   Parainfluenza Virus 1 NOT DETECTED NOT DETECTED Final   Parainfluenza Virus 2 NOT DETECTED NOT DETECTED Final   Parainfluenza Virus 3 NOT DETECTED NOT DETECTED Final   Parainfluenza Virus 4 NOT DETECTED NOT DETECTED Final   Respiratory Syncytial Virus NOT DETECTED NOT DETECTED Final   Bordetella pertussis NOT DETECTED NOT DETECTED Final   Chlamydophila pneumoniae NOT DETECTED NOT DETECTED Final   Mycoplasma pneumoniae NOT DETECTED NOT DETECTED Final    Comment: Performed at [facility], [place] [address] [place], [place] [address]      Radiology Studies: Dg Chest 2 View  Result Date: 06/30/2018 CLINICAL DATA:  SHORTNESS OF BREATH.  COUGH.  PLEURAL EFFUSIONS. EXAM: CHEST - 2 VIEW COMPARISON:  TWO-VIEW CHEST X-RAY 06/27/2018 FINDINGS: Heart is enlarged. Left greater than right pleural effusions and basilar airspace disease are again seen. Aeration is improving. Mild pulmonary vascular congestion is present as well. IMPRESSION: 1. Improving aeration of both lungs. 2. Persistent pleural effusions and airspace disease, left greater than right. This remains concerning for infection. Electronically Signed   By: San Morelle M.D.   On: 06/30/2018 08:52      LOS: 4 days   Time spent: More  than 50% of that time was spent in counseling and/or coordination of care.  Antonieta Pert, MD [facility]  06/30/2018, 11:07 AM

## 2018-06-30 NOTE — Progress Notes (Signed)
PROGRESS NOTE  Amanda [name]  GMW:[id number] DOB: 21-Jun-1940 DOA: 06/26/2018 PCP: [name], DO   Brief Narrative: [phone] w/ Hx stage IV NHLrecent ADMIT for low grade fever- neg covid, aki, hypercalcemia (06/16/2018 to  06/20/2018), p/w to [facility] with shortness of breath and new oxygen requirement., DOE and at rest, cough but no fever/no wheezing, no sick contacts.Seen by oncologist likely not from [place]. Now in  ER w Rf 39 oxygen sat 88%  RA, WBC 8.1 creat 1.21.  Calcium is 11.0.  Troponin is 0.1 and glucose 99.  Urinalysis is negative.  Chest x-ray showed bilateral basal thickening with some pleural effusion.  BNP 171.  CTA NO PE but Extensive thoracic adenopathy mildly progressive from previous  CT last month.Worsening of lymphoma.  Splenomegaly and upper abdomen adenopathy are grossly stable.  Enlarging bilateral pleural effusions with worsening airspace opacities in both lower lobes the right middle lobe and lingula. Patern is  nonspecific but could be seen in the secondary to superimposed atelectasis,infection or edema.COVID-19 testing was negative. Pt was started on broad spectrumfor antibiotics and was admitted.  Subjective: Resting well off oxygen, felt better after Lasix.  Denies cough fever chest pain nausea vomiting. Assessment & Plan:   Air space opacity noted in the CT scan with bilateral pleural effusions, possible HCAP. On empiric-vanco/cefepime. Legionella/Strep Ag neg, COVID 19 neg. blood culture NGTD.Continue pulmonary toileting. procal normal. Will stop antibiotics.  Vanco.  Shortness of breath/dyspnea on exertion with acute respiratory failure with hypoxia due to #1, history of bronchiectasis: no PE.  Patient feeling much better this morning.  Off oxygen.  We will continue on IV diuresis, follow-up chest x-ray in the morning.  Appreciate pulmonary input on board.  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  Given her worsening adenopathy, discussed with  Dr.  Maylon Peppers- who feels patient's issues may most likely pulmonary.  Doubt transformation of lymphoma, will plan to treat her lymphoma once stable from respiratory wise and -oncology recommendation as patient is seeking second opinion at [facility].    Hypercalcemia, in the setting of NHL , recently seen by endocrinology Dr Loanne Drilling 4/23, 25 vit d was low- avoiding supplement and calcium, pth was low. If persistent Endocrine advised cinacalcet.  Calcium remains somewhat  high.  Will need close follow-up and monitoring.    Hypothyroidism : Continue her Synthroid  HTN: Stable on amlodipine  Mildy elevated troponin:suspect demand mismatch in the setting of #1, troponin 0 0.06->0.1->0.08>0.07   Patient denied chest pain. EKG-no overt signs of ischemia, Old anterior infarct. Obtaiend limited echo and has normal EF with no acute finding.  Constipation: continue Laxative as needed.  DVT prophylaxis: lovenox Code Status: wishes to be full code Family Communication: discussed with patient in detail and she has verbalized understanding. Discussed with patient's husband over the phone  4/26, 4/27. Disposition Plan: remains inpatient pending clinical improvement.  Consultants:  None  Procedures: 4/25 Limited echo Technically difficult  1. The left ventricle has normal systolic function, with an ejection fraction of 60-65%. The cavity size was normal. There is moderately increased left ventricular wall thickness.  2. The right ventricle has normal systolc function. The cavity was normal. There is no increase in right ventricular wall thickness.  3. Large pleural effusion.  CTA Extensive thoracic adenopathy mildly progressive from previous  CT last month.Worsening of lymphoma.  Splenomegaly and upper abdomen adenopathy are grossly stable.  Enlarging bilateral pleural  effusions with worsening airspace opacities in both lower lobes the right middle lobe and  lingula. Patern is  nonspecific but could be seen in the secondary to superimposed atelectasis,infection or edema.  Antimicrobials: Anti-infectives (From admission, onward)   Start     Dose/Rate Route Frequency Ordered Stop   06/28/18 1600  vancomycin (VANCOCIN) 1,250 mg in sodium chloride 0.9 % 250 mL IVPB  Status:  Discontinued     1,250 mg 166.7 mL/hr over 90 Minutes Intravenous Every 48 hours 06/26/18 2205 06/28/18 1023   06/28/18 1030  vancomycin (VANCOCIN) IVPB 1000 mg/200 mL premix  Status:  Discontinued     1,000 mg 200 mL/hr over 60 Minutes Intravenous Every 36 hours 06/28/18 1023 06/30/18 1105   06/26/18 2215  ceFEPIme (MAXIPIME) 2 g in sodium chloride 0.9 % 100 mL IVPB  Status:  Discontinued     2 g 200 mL/hr over 30 Minutes Intravenous Every 12 hours 06/26/18 2153 06/30/18 1105   06/26/18 1330  vancomycin (VANCOCIN) IVPB 1000 mg/200 mL premix     1,000 mg 200 mL/hr over 60 Minutes Intravenous  Once 06/26/18 1327 06/26/18 1618   06/26/18 1330  piperacillin-tazobactam (ZOSYN) IVPB 3.375 g     3.375 g 12.5 mL/hr over 240 Minutes Intravenous  Once 06/26/18 1327 06/26/18 1455       Objective: Vitals:   06/30/18 0855 06/30/18 1004 06/30/18 1135 06/30/18 1205  BP:   135/70 (!) 147/63  Pulse: 72 82    Resp: 20 19 20  (!) 21  Temp:      TempSrc:      SpO2: 96% 92%    Weight:      Height:        Intake/Output Summary (Last 24 hours) at 06/30/2018 1501 Last data filed at 06/30/2018 1030 Gross per 24 hour  Intake 2245.72 ml  Output 2700 ml  Net -454.28 ml   Filed Weights   06/26/18 1131 06/27/18 0620 06/29/18 0444  Weight: 64 kg 64.2 kg 66.2 kg   Weight change:   Body mass index is 27.57 kg/m.  Intake/Output from previous day: 04/27 0701 - 04/28 0700 In: 2005.7 [P.O.:120; I.V.:643; IV Piggyback:1242.7] Out: 2300 [Urine:2300] Intake/Output this shift: Total I/O In: 240 [P.O.:240] Out: 800 [Urine:800]  Examination: General exam: Calm, comfortable, not in acute  distress. HEENT:Oral mucosa moist, Ear/Nose WNL grossly, dentition normal. Respiratory system: Bilateral diminished breath sounds at the base, no wheezing or crackles Cardiovascular system: RRR, S1 & S2 heard, No JVD/murmurs. Gastrointestinal system: Abdomen soft, non-tender, non-distended, BS +. No hepatosplenomegaly palpable. Nervous System: AA0X3. Able to move UE and LE, sensation intact. Extremities: No edema, distal peripheral pulses palpable.  Skin: No rashes,no icterus. MSK: Normal muscle bulk,tone, power  Medications:  Scheduled Meds:  amLODipine  5 mg Oral Q breakfast   aspirin EC  81 mg Oral Daily   budesonide (PULMICORT) nebulizer solution  0.25 mg Nebulization BID   buPROPion  150 mg Oral Daily   docusate sodium  100 mg Oral BID   enoxaparin (LOVENOX) injection  40 mg Subcutaneous Q24H   famotidine  20 mg Oral QHS   feeding supplement (ENSURE ENLIVE)  237 mL Oral BID BM   fluticasone  1 spray Each Nare Daily   [START ON 07/01/2018] furosemide  20 mg Intravenous Daily   ipratropium-albuterol  3 mL Nebulization BID   levothyroxine  125 mcg Oral Q0600   mouth rinse  15 mL Mouth Rinse BID   pantoprazole  40 mg Oral Daily  potassium chloride  30 mEq Oral Daily   [START ON 07/01/2018] predniSONE  20 mg Oral Q breakfast   sodium chloride  2 spray Each Nare BID   Continuous Infusions:  sodium chloride Stopped (06/26/18 1343)    Data Reviewed: I have personally reviewed following labs and imaging studies  CBC: Recent Labs  Lab 06/26/18 1147 06/26/18 2234 06/28/18 0313 06/29/18 0449 06/30/18 0348  WBC 8.1 7.4 5.3 6.3 9.2  NEUTROABS 5.0  --   --   --   --   HGB 11.6* 11.0* 10.2* 9.3* 9.3*  HCT 36.1 32.7* 30.7* 28.5* 28.9*  MCV 80.9 78.4* 78.9* 79.4* 79.4*  PLT 247 237 234 256 426   Basic Metabolic Panel: Recent Labs  Lab 06/27/18 0450 06/27/18 1856 06/28/18 0313 06/29/18 0449 06/30/18 0348  NA 136 137 137 140 140  K 3.9 3.7 3.8 3.5 3.5    CL 101 105 105 110 108  CO2 23 20* 20* 21* 23  GLUCOSE 87 110* 148* 180* 112*  BUN 13 12 12 20  26*  CREATININE 1.32* 1.12* 1.10* 1.07* 1.04*  CALCIUM 10.2 10.2 10.0 10.3 11.4*   GFR: Estimated Creatinine Clearance: 38.2 mL/min (A) (by C-G formula based on SCr of 1.04 mg/dL (H)). Liver Function Tests: Recent Labs  Lab 06/26/18 1147 06/27/18 0450 06/27/18 1856  AST 28 22 28   ALT 15 13 17   ALKPHOS 72 61 65  BILITOT 0.8 0.7 0.7  PROT 6.6 5.0* 5.4*  ALBUMIN 3.6 2.8* 2.9*   No results for input(s): LIPASE, AMYLASE in the last 168 hours. No results for input(s): AMMONIA in the last 168 hours. Coagulation Profile: No results for input(s): INR, PROTIME in the last 168 hours. Cardiac Enzymes: Recent Labs  Lab 06/26/18 1534 06/27/18 0803 06/27/18 1027 06/27/18 1344 06/27/18 1856  TROPONINI 0.10* 0.08* 0.07* 0.07* 0.07*   BNP (last 3 results) No results for input(s): PROBNP in the last 8760 hours. HbA1C: No results for input(s): HGBA1C in the last 72 hours. CBG: No results for input(s): GLUCAP in the last 168 hours. Lipid Profile: No results for input(s): CHOL, HDL, LDLCALC, TRIG, CHOLHDL, LDLDIRECT in the last 72 hours. Thyroid Function Tests: No results for input(s): TSH, T4TOTAL, FREET4, T3FREE, THYROIDAB in the last 72 hours. Anemia Panel: No results for input(s): VITAMINB12, FOLATE, FERRITIN, TIBC, IRON, RETICCTPCT in the last 72 hours. Sepsis Labs: Recent Labs  Lab 06/30/18 0348  PROCALCITON <0.10    Recent Results (from the past 240 hour(s))  SARS Coronavirus 2 [facility] order, Performed in [facility] hospital lab)     Status: None   Collection Time: 06/26/18 12:06 PM  Result Value Ref Range Status   SARS Coronavirus 2 NEGATIVE NEGATIVE Final    Comment: (NOTE) If result is NEGATIVE SARS-CoV-2 target nucleic acids are NOT DETECTED. The SARS-CoV-2 RNA is generally detectable in upper and lower  respiratory specimens during the acute phase of infection. The  lowest  concentration of SARS-CoV-2 viral copies this assay can detect is 250  copies / mL. A negative result does not preclude SARS-CoV-2 infection  and should not be used as the sole basis for treatment or other  patient management decisions.  A negative result may occur with  improper specimen collection / handling, submission of specimen other  than nasopharyngeal swab, presence of viral mutation(s) within the  areas targeted by this assay, and inadequate number of viral copies  (<250 copies / mL). A negative result must be combined with clinical  observations,  patient history, and epidemiological information. If result is POSITIVE SARS-CoV-2 target nucleic acids are DETECTED. The SARS-CoV-2 RNA is generally detectable in upper and lower  respiratory specimens dur ing the acute phase of infection.  Positive  results are indicative of active infection with SARS-CoV-2.  Clinical  correlation with patient history and other diagnostic information is  necessary to determine patient infection status.  Positive results do  not rule out bacterial infection or co-infection with other viruses. If result is PRESUMPTIVE POSTIVE SARS-CoV-2 nucleic acids MAY BE PRESENT.   A presumptive positive result was obtained on the submitted specimen  and confirmed on repeat testing.  While 2019 novel coronavirus  (SARS-CoV-2) nucleic acids may be present in the submitted sample  additional confirmatory testing may be necessary for epidemiological  and / or clinical management purposes  to differentiate between  SARS-CoV-2 and other Sarbecovirus currently known to infect humans.  If clinically indicated additional testing with an alternate test  methodology [phone]) is advised. The SARS-CoV-2 RNA is generally  detectable in upper and lower respiratory sp ecimens during the acute  phase of infection. The expected result is Negative. Fact Sheet for Patients:   StrictlyIdeas.no Fact Sheet for Healthcare Providers: BankingDealers.co.za This test is not yet approved or cleared by the [place] FDA and has been authorized for detection and/or diagnosis of SARS-CoV-2 by FDA under an Emergency Use Authorization (EUA).  This EUA will remain in effect (meaning this test can be used) for the duration of the COVID-19 declaration under Section 564(b)(1) of the Act, 21 U.S.C. section 360bbb-3(b)(1), unless the authorization is terminated or revoked sooner. Performed at [facility], [place] [address], [place], [place] [address]   Culture, blood (routine x 2) Call MD if unable to obtain prior to antibiotics being given     Status: None (Preliminary result)   Collection Time: 06/26/18 10:34 PM  Result Value Ref Range Status   Specimen Description BLOOD LEFT ARM  Final   Special Requests   Final    BOTTLES DRAWN AEROBIC ONLY Blood Culture adequate volume   Culture   Final    NO GROWTH 4 DAYS Performed at [facility], [place] [address], [place], [place] [address]    Report Status PENDING  Incomplete  Culture, blood (routine x 2) Call MD if unable to obtain prior to antibiotics being given     Status: None (Preliminary result)   Collection Time: 06/26/18 10:38 PM  Result Value Ref Range Status   Specimen Description BLOOD LEFT HAND  Final   Special Requests   Final    BOTTLES DRAWN AEROBIC AND ANAEROBIC Blood Culture adequate volume   Culture   Final    NO GROWTH 4 DAYS Performed at [facility], [place] [address] [place], [place] [address]    Report Status PENDING  Incomplete  Respiratory Panel by PCR     Status: None   Collection Time: 06/29/18  3:00 PM  Result Value Ref Range Status   Adenovirus NOT DETECTED NOT DETECTED Final   Coronavirus 229E NOT DETECTED NOT DETECTED Final    Comment: (NOTE) The Coronavirus on the Respiratory Panel, DOES NOT test for the novel  Coronavirus (2019  nCoV)    Coronavirus HKU1 NOT DETECTED NOT DETECTED Final   Coronavirus NL63 NOT DETECTED NOT DETECTED Final   Coronavirus OC43 NOT DETECTED NOT DETECTED Final   Metapneumovirus NOT DETECTED NOT DETECTED Final   Rhinovirus / Enterovirus NOT DETECTED NOT DETECTED  Final   Influenza A NOT DETECTED NOT DETECTED Final   Influenza B NOT DETECTED NOT DETECTED Final   Parainfluenza Virus 1 NOT DETECTED NOT DETECTED Final   Parainfluenza Virus 2 NOT DETECTED NOT DETECTED Final   Parainfluenza Virus 3 NOT DETECTED NOT DETECTED Final   Parainfluenza Virus 4 NOT DETECTED NOT DETECTED Final   Respiratory Syncytial Virus NOT DETECTED NOT DETECTED Final   Bordetella pertussis NOT DETECTED NOT DETECTED Final   Chlamydophila pneumoniae NOT DETECTED NOT DETECTED Final   Mycoplasma pneumoniae NOT DETECTED NOT DETECTED Final    Comment: Performed at [facility], [place] [address] [place], [place] [address]      Radiology Studies: Dg Chest 2 View  Result Date: 06/30/2018 CLINICAL DATA:  SHORTNESS OF BREATH.  COUGH.  PLEURAL EFFUSIONS. EXAM: CHEST - 2 VIEW COMPARISON:  TWO-VIEW CHEST X-RAY 06/27/2018 FINDINGS: Heart is enlarged. Left greater than right pleural effusions and basilar airspace disease are again seen. Aeration is improving. Mild pulmonary vascular congestion is present as well. IMPRESSION: 1. Improving aeration of both lungs. 2. Persistent pleural effusions and airspace disease, left greater than right. This remains concerning for infection. Electronically Signed   By: San Morelle M.D.   On: 06/30/2018 08:52      LOS: 4 days   Time spent: More than 50% of that time was spent in counseling and/or coordination of care.  Antonieta Pert, MD [facility]  06/30/2018, 3:01 PM

## 2018-07-01 ENCOUNTER — Encounter: Payer: Self-pay | Admitting: *Deleted

## 2018-07-01 ENCOUNTER — Encounter (HOSPITAL_COMMUNITY): Payer: Self-pay | Admitting: Hematology

## 2018-07-01 ENCOUNTER — Inpatient Hospital Stay (HOSPITAL_COMMUNITY): Payer: Medicare Other

## 2018-07-01 DIAGNOSIS — J9601 Acute respiratory failure with hypoxia: Secondary | ICD-10-CM

## 2018-07-01 DIAGNOSIS — K219 Gastro-esophageal reflux disease without esophagitis: Secondary | ICD-10-CM

## 2018-07-01 DIAGNOSIS — C8580 Other specified types of non-Hodgkin lymphoma, unspecified site: Secondary | ICD-10-CM

## 2018-07-01 DIAGNOSIS — E039 Hypothyroidism, unspecified: Secondary | ICD-10-CM

## 2018-07-01 DIAGNOSIS — E785 Hyperlipidemia, unspecified: Secondary | ICD-10-CM

## 2018-07-01 DIAGNOSIS — J189 Pneumonia, unspecified organism: Secondary | ICD-10-CM

## 2018-07-01 DIAGNOSIS — C8304 Small cell B-cell lymphoma, lymph nodes of axilla and upper limb: Secondary | ICD-10-CM

## 2018-07-01 DIAGNOSIS — R0902 Hypoxemia: Secondary | ICD-10-CM

## 2018-07-01 DIAGNOSIS — D509 Iron deficiency anemia, unspecified: Secondary | ICD-10-CM

## 2018-07-01 LAB — CBC WITH DIFFERENTIAL/PLATELET
Abs Immature Granulocytes: 0.29 10*3/uL — ABNORMAL HIGH (ref 0.00–0.07)
Basophils Absolute: 0 10*3/uL (ref 0.0–0.1)
Basophils Relative: 0 %
Eosinophils Absolute: 0.2 10*3/uL (ref 0.0–0.5)
Eosinophils Relative: 2 %
HCT: 28.9 % — ABNORMAL LOW (ref 36.0–46.0)
Hemoglobin: 9.7 g/dL — ABNORMAL LOW (ref 12.0–15.0)
Immature Granulocytes: 4 %
Lymphocytes Relative: 30 %
Lymphs Abs: 2.3 10*3/uL (ref 0.7–4.0)
MCH: 26.4 pg (ref 26.0–34.0)
MCHC: 33.6 g/dL (ref 30.0–36.0)
MCV: 78.5 fL — ABNORMAL LOW (ref 80.0–100.0)
Monocytes Absolute: 1 10*3/uL (ref 0.1–1.0)
Monocytes Relative: 13 %
Neutro Abs: 4.1 10*3/uL (ref 1.7–7.7)
Neutrophils Relative %: 51 %
Platelets: 312 10*3/uL (ref 150–400)
RBC: 3.68 MIL/uL — ABNORMAL LOW (ref 3.87–5.11)
RDW: 16 % — ABNORMAL HIGH (ref 11.5–15.5)
WBC: 7.9 10*3/uL (ref 4.0–10.5)
nRBC: 0 % (ref 0.0–0.2)

## 2018-07-01 LAB — CULTURE, BLOOD (ROUTINE X 2)
Culture: NO GROWTH
Culture: NO GROWTH
Special Requests: ADEQUATE
Special Requests: ADEQUATE

## 2018-07-01 LAB — BASIC METABOLIC PANEL
Anion gap: 10 (ref 5–15)
BUN: 26 mg/dL — ABNORMAL HIGH (ref 8–23)
CO2: 24 mmol/L (ref 22–32)
Calcium: 11.2 mg/dL — ABNORMAL HIGH (ref 8.9–10.3)
Chloride: 107 mmol/L (ref 98–111)
Creatinine, Ser: 1.15 mg/dL — ABNORMAL HIGH (ref 0.44–1.00)
GFR calc Af Amer: 52 mL/min — ABNORMAL LOW (ref >60)
GFR calc non Af Amer: 45 mL/min — ABNORMAL LOW (ref >60)
Glucose, Bld: 109 mg/dL — ABNORMAL HIGH (ref 70–99)
Potassium: 3.7 mmol/L (ref 3.5–5.1)
Sodium: 141 mmol/L (ref 135–145)

## 2018-07-01 MED ORDER — PREDNISONE 10 MG (21) PO TBPK: ORAL_TABLET | ORAL | 0 refills | Status: DC

## 2018-07-01 NOTE — Progress Notes (Addendum)
NAME:  Amanda [name], MRN:  [id number], DOB:  [birth year], LOS: 5 ADMISSION DATE:  06/26/2018, CONSULTATION DATE:  06/29/18 REFERRING MD:  Maylon Peppers - oncology , CHIEF COMPLAINT:  Dyspnea   Brief History   78 year old F with stage IV NH Lymphoma, worsening bilatearl pleural effusions, intermittent shortness of breath. PCCM asked to evaluate  History of present illness   78 yo F with PMH stage IV NH Lymphoma, hypothyroidism, Recent PNA, Bronchiectasis, OA, HLD, GERD who presented to ED 4/24 with progressive shortness of breath. Associated symptoms include non-productive cough. Initially, dyspnea was limited to exertion but progressed to mild dyspnea at rest.  Of note patient was recently hospitalized 4/14-4/18vfor progression of cough and shortness of brath, and was treated with rocephin empirically. In ED patient CXR revealed multifocal opacities and patient was admitted to hospital for HCAP.  CTA chest reveals no PE but does show progression of lymphoma and bilateral pleural effusions.   Past Medical History   Anemia    . Arthritis   . Cancer ([place])   . Constipation, chronic   . GERD (gastroesophageal reflux disease)    zantac  . Heart murmur   . History of blood transfusion [place]  . Hyperlipidemia   . Hypothyroidism   . Lymphoproliferative disorder ([place])   . Macular degeneration 2013   Both eyes   . Osteopenia   . Pneumonia   . PONV (postoperative nausea and vomiting)    needs little anesthesia  . Shingles   . Shortness of breath    on exertion  . Spleen enlarged   . SUI (stress urinary incontinence, female)   . Wears glasses      Significant Hospital Events   4/24> admitted  4/27> PCCM consulted given bilateral pleural effusions of unknown etiology   Consults:  Oncology PCCM Procedures:    Significant Diagnostic Tests:  CT angio chest 4/24> No PE. Progression of thoracic adenopathy suggestive of worsening lymphoma. Bilateral pleural  effusions, increased from prior.  Net negative 2.3 L B Cell clonality studies: Positive CXR 4/29>.  Bilateral pleural effusions,L>R ,  bibasilar atelectasis and patchy airspace opacity,stable  Micro Data:  SARS-CoV-2 4/24> not detected BCx 4/24> NGTD Sputum cx 4/24>>> RVP 4/27>>> Urine Cx>> Needs recollection>> multiple species present  Antimicrobials:  vanc 4/24>>> Cefepime 4/24>>>   Interim history/subjective:  Pt has been off oxygen since 4/27 States she feels better, CXR with improving aeration Has a bit of a cough>> non-productive She is net negative 2300 cc's Is anxious to be discharged, but needs port a cath placement per primary team prior  Objective   Blood pressure (!) 144/76, pulse 68, temperature 98.5 F (36.9 C), temperature source Oral, resp. rate 19, height 5\' 1"  (1.549 m), weight 63.5 kg, SpO2 94 %.        Intake/Output Summary (Last 24 hours) at 07/01/2018 1024 Last data filed at 07/01/2018 0900 Gross per 24 hour  Intake 1200 ml  Output 2800 ml  Net -1600 ml   Filed Weights   06/27/18 0620 06/29/18 0444 07/01/18 0530  Weight: 64.2 kg 66.2 kg 63.5 kg    Examination: General: WDWN elderly female, supine in bed, NAD, on RA, speaking in full sentences HENT: NCAT, pink mmm, trachea midline, anicteric sclera,No JVD   Lungs: Bilateral chest excursion, CTA bilaterally (anteriorly, laterally, posteriorly). No accessory muscle use  on room air.Diminished per bases, no cough, no upper airway wheeze Cardiovascular: s1s2, RRR,  no r/g/m. No JVD.  2+ radial pulses , NO LE edema Abdomen: Soft, round, ndnt, BS normoactive x 4 quads Extremities: Symmetrical bulk and tone, no clubbing, no cyanosis, no obvious deformities. Warm and dry Neuro: AAOx4. PERRL. Following commands. No focal deficits, appropriate  Skin: clean, dry, warm, without rash GU: [facility] Problem list     Assessment & Plan:   Stage IV  NH Lymphoma -Favoring nodal marginal zone  lymphoma - B Cell clonality studies: Positive  P -Per Oncology>> following -Given hypercalcemia and interval thoracic lymph node enlargement oncology is considering systemic treatment - She is scheduled for second opinion at [facility] next week   Acute respiratory insufficiency with hypoxia, improved after treatment with Lasix ( Net negative 2.3 L ) -Bilateral pleural effusion on CTA  4/24  -CTA negative for PE 4/24  -COVID-19 not detected (4/24) -?HCAP  - PCT < 0.10 Afebrile off antibiotics P - Patient is stable on room air at this time, without complaint of SOB. No increase in WOB. Slight improvement in CXR. Net negative 2300 cc's after lasix.  Inpatient diagnostic thora vs. outpatient follow up. PCT < 0.10, afebrile . Per oncology note, they  would like evaluation of pleural fluid etiology prior to initiating systemic therapy.   - Currently SpO2 95% on RA, can use supplemental O2 PRN for goal SpO2 >92 - Off Abx x 24 hours  - Continued Diuresis per primary team - Trend CXR prn - Follow up sputum culture>> RVP negative - IS q1hr  - OOB to chair, PT/OT - pulmicort, duoneb BID, PRN albuterol  - Continue Flonase and nasal saline for upper airway cough/ wheeze - Will need outpatient follow up with CXR at discharge  to ensure resolution vs chronic recurrance>> May Need  Thora at later date if re-accumulates  Pt would like porta cath inserted prior to discharge from hospital>> Per primary team and oncology  Rest of care  per primary team and oncology Please consider recollection of Urine culture as initial culture grew multiple species.  Best practice:  Diet: Regular Pain/Anxiety/Delirium protocol (if indicated): APAP DVT prophylaxis: lovenox  GI prophylaxis: pepcid  Mobility: PT/OT  Code Status: Full  Family Communication: Husband, lonnie, updated via phone 06/30/2018 Disposition: Continue current level of care   Labs   CBC: Recent Labs  Lab 06/26/18 1147 06/26/18 2234  06/28/18 0313 06/29/18 0449 06/30/18 0348 07/01/18 0247  WBC 8.1 7.4 5.3 6.3 9.2 7.9  NEUTROABS 5.0  --   --   --   --  4.1  HGB 11.6* 11.0* 10.2* 9.3* 9.3* 9.7*  HCT 36.1 32.7* 30.7* 28.5* 28.9* 28.9*  MCV 80.9 78.4* 78.9* 79.4* 79.4* 78.5*  PLT 247 237 234 256 290 474    Basic Metabolic Panel: Recent Labs  Lab 06/27/18 1856 06/28/18 0313 06/29/18 0449 06/30/18 0348 07/01/18 0247  NA 137 137 140 140 141  K 3.7 3.8 3.5 3.5 3.7  CL 105 105 110 108 107  CO2 20* 20* 21* 23 24  GLUCOSE 110* 148* 180* 112* 109*  BUN 12 12 20  26* 26*  CREATININE 1.12* 1.10* 1.07* 1.04* 1.15*  CALCIUM 10.2 10.0 10.3 11.4* 11.2*   GFR: Estimated Creatinine Clearance: 33.9 mL/min (A) (by C-G formula based on SCr of 1.15 mg/dL (H)). Recent Labs  Lab 06/28/18 0313 06/29/18 0449 06/30/18 0348 07/01/18 0247  PROCALCITON  --   --  <0.10  --   WBC 5.3 6.3 9.2 7.9    Liver Function Tests: Recent Labs  Lab 06/26/18 1147 06/27/18 0450 06/27/18 1856  AST 28 22 28   ALT 15 13 17   ALKPHOS 72 61 65  BILITOT 0.8 0.7 0.7  PROT 6.6 5.0* 5.4*  ALBUMIN 3.6 2.8* 2.9*   No results for input(s): LIPASE, AMYLASE in the last 168 hours. No results for input(s): AMMONIA in the last 168 hours.  ABG    Component Value Date/Time   PHART 7.458 (H) 06/27/2018 2050   PCO2ART 34.5 06/27/2018 2050   PO2ART 77.1 (L) 06/27/2018 2050   HCO3 24.1 06/27/2018 2050   O2SAT 95.9 06/27/2018 2050     Coagulation Profile: No results for input(s): INR, PROTIME in the last 168 hours.  Cardiac Enzymes: Recent Labs  Lab 06/26/18 1534 06/27/18 0803 06/27/18 1027 06/27/18 1344 06/27/18 1856  TROPONINI 0.10* 0.08* 0.07* 0.07* 0.07*    HbA1C: Hgb A1c MFr Bld  Date/Time Value Ref Range Status  11/18/2014 12:57 AM 6.1 (H) 4.8 - 5.6 % Final    Comment:    (NOTE)         Pre-diabetes: 5.7 - 6.4         Diabetes: >6.4         Glycemic control for adults with diabetes: <7.0   11/29/2011 03:51 PM 5.8 4.6 - 6.5  % Final    Comment:    Glycemic Control Guidelines for People with Diabetes:Non Diabetic:  <6%Goal of Therapy: <7%Additional Action Suggested:  >8%     CBG: No results for input(s): GLUCAP in the last 168 hours.   Past Medical History  She,  has a past medical history of Anemia, Arthritis, Cancer ([place]), Constipation, chronic, GERD (gastroesophageal reflux disease), Heart murmur, History of blood transfusion (1959), Hyperlipidemia, Hypothyroidism, Lymphoproliferative disorder ([place]), Macular degeneration (2013), Osteopenia, Pneumonia, PONV (postoperative nausea and vomiting), Shingles, Shortness of breath, Spleen enlarged, SUI (stress urinary incontinence, female), and Wears glasses.   Surgical History    Past Surgical History:  Procedure Laterality Date  . BREAST EXCISIONAL BIOPSY Left 1980  . CARPAL TUNNEL RELEASE  1999  . CATARACT EXTRACTION  2009, 2011   BOTH EYES  . [place]  . COLONOSCOPY      Dr Cristina Gong  . DILATION AND CURETTAGE OF UTERUS     X2  . HYSTEROSCOPY W/D&C  01/07/2012   Procedure: DILATATION AND CURETTAGE /HYSTEROSCOPY;  Surgeon: Terrance Mass, MD;  Location: [facility];  Service: Gynecology;  Laterality: N/A;  intrauterine foley catheter for tamponode   . LYMPH NODE BIOPSY Left 05/26/2018   Procedure: LEFT AXILLARY LYMPH NODE BIOPSY;  Surgeon: Fanny Skates, MD;  Location: [facility];  Service: General;  Laterality: Left;  . TONSILLECTOMY    . TONSILLECTOMY AND ADENOIDECTOMY    . TUBAL LIGATION     BY LAPAROSCOPY  . WISDOM TOOTH EXTRACTION       Social History   reports that she has never smoked. She has never used smokeless tobacco. She reports current alcohol use. She reports that she does not use drugs.   Family History   Her family history includes Breast cancer (age of onset: 66) in her mother; COPD in her paternal grandfather; Diabetes in her father; Hyperlipidemia in her brother; Hypertension in her father; Kidney failure in her father; Ovarian  cancer in her mother; Prostate cancer in her father; Stroke in her maternal grandfather. There is no history of Hypercalcemia.   Allergies Allergies  Allergen Reactions  . Phenergan [Promethazine Hcl] Other (See Comments)    Jerking/agitation  .  Levofloxacin Nausea And Vomiting  . Pravastatin Sodium Nausea And Vomiting     Home Medications  Prior to Admission medications   Medication Sig Start Date End Date Taking? Authorizing Provider  acetaminophen (TYLENOL) 500 MG tablet Take 1,000 mg by mouth every 6 (six) hours as needed for mild pain.   Yes [provider]  albuterol (PROVENTIL) (2.5 MG/3ML) 0.083% nebulizer solution Take 3 mLs (2.5 mg total) by nebulization every 6 (six) hours as needed for wheezing or shortness of breath. 04/24/18  Yes Carollee Herter, Yvonne R, DO  amLODipine (NORVASC) 5 MG tablet Take 1 tablet (5 mg total) by mouth daily with breakfast. 06/21/18  Yes Mariel Aloe, MD  buPROPion (WELLBUTRIN XL) 150 MG 24 hr tablet TAKE 1 TABLET BY MOUTH EVERY DAY 02/10/18  Yes Roma Schanz R, DO  famotidine (PEPCID) 20 MG tablet One at bedtime Patient taking differently: Take 20 mg by mouth at bedtime. One at bedtime  06/05/18  Yes Tanda Rockers, MD  feeding supplement, ENSURE ENLIVE, (ENSURE ENLIVE) LIQD Take 237 mLs by mouth 2 (two) times daily between meals. 06/20/18  Yes Mariel Aloe, MD  levothyroxine (SYNTHROID, LEVOTHROID) 125 MCG tablet TAKE 1 TABLET BY MOUTH DAILY 05/15/18  Yes Roma Schanz R, DO  omeprazole (PRILOSEC) 20 MG capsule Take 2 x 30 min before breakfast Patient taking differently: Take 20 mg by mouth daily.  06/05/18  Yes Tanda Rockers, MD  ondansetron (ZOFRAN) 4 MG tablet Take 1 tablet (4 mg total) by mouth every 8 (eight) hours as needed for nausea or vomiting. 06/08/18  Yes Roma Schanz R, DO  albuterol (PROVENTIL HFA;VENTOLIN HFA) 108 (90 Base) MCG/ACT inhaler Inhale 2 puffs into the lungs every 6 (six) hours as needed for up to 30  days for wheezing or shortness of breath. 05/22/18 06/21/18  Tish Men, MD  potassium chloride 20 MEQ TBCR Take 20 mEq by mouth daily for 3 days. 06/21/18 06/24/18  Mariel Aloe, MD     Magdalen Spatz, MSN, AGACNP-BC [place] [phone] If no answer, [phone] 07/01/2018, 10:24 AM

## 2018-07-01 NOTE — Progress Notes (Signed)
Received a call from the patient's husband because he received a text from the patient stating she didn't want the port right now and wanted to wait. He is concerned that she is confused about something and would like me to follow up.  Called and spoke to patient. She is not confused. She doesn't want the port right now. She states she is tired and worn out. She wants to wait until she feels better and completes her second opinion before having the port placed. She has a very clear thought process. Let patient know that she could delay port placement and we would revisit after her appointment at [facility] and she felt better.  Let husband know that I spoke with patient and she does not appear confused and has made a decision she feels comfortable with.

## 2018-07-01 NOTE — Progress Notes (Signed)
Amanda [name]   DOB:Nov 15, 1940   EQ#:[id number]    HEME/ONC OVERVIEW: 1.Stage IV NHL, favoring nodal marginal zone lymphoma -05/2018: ? CT chest showed pathologic lymphadenopathy involving bilateral axilla, mediastinum as well as multiple small lung nodules and possible splenomegaly ? PET showed FDG-avid lymphadenopathy in neck, chest, abdomen and pelvis, questionable involvement of the lung, and focal involvement of left scapula and acetabulum  ? Left axillary LN excisional bx: NHL, favoring low-grade marginal zone lymphoma; Cyclin-D1 neg ([facility])  TREATMENT REGIMEN:  TBD   ASSESSMENT & PLAN:  Acute hypoxic respiratory failure -Overall improving with diuresis -Clinically, patient reports some improvement in her breathing -Chest x-ray from this morning show slight improvement. -I appreciate assistance from pulmonary medicine -I suspect some of the opacities in the lung may be from volume overload from aggressive hydration for hypercalcemia during the previous hospitalization -I will defer further management to the hospitalist and pulmonary teams   Stage IV NHL, favoring nodal marginal zone lymphoma  -Case has been discussed at length with the patient's spouse, and separately, their daughters, about the diagnosis, treatment options and prognosis -They have been informed them that the treatment decision is evolving due to the change in the patient's clinical condition, including new onset hypercalcemia and worsening dyspnea after the recent lymphoma diagnosis -Given that the patient's spouse has some reservation about her treatment plan, recommend for them to seek a second opinion to ensure that they feel comfortable with the chemotherapy regimen  -Case has been discussed in detail with Dr. Jolayne Haines at [facility], who kindly agreed to see the patient after discharge.  She is scheduled to see them on Monday, 07/06/2018. -I have placed an order for Port-A-Cath placement.   Recurrent  hypercalcemia -Ca 11.2 today, which is stable -We will monitor it for now; avoid calcium supplementation -If Ca continues to rise, we can consider giving one dose of bisphosphonate to prevent recurrent hypercalcemia   Microcytic anemia -Hgb 9.7 today, stable -Patient denies any symptoms of bleeding -We will monitor it for now   Thank you for caring for our mutual patient.  Please do not hesitate contact me if there are any questions.  Mikey Bussing, NP 07/01/2018  1:16 PM   Subjective:  Amanda [name] reports that her breathing continues to improve.  Denies chest discomfort.  Hoping to have a Port-A-Cath placed soon.  She denies any other complaint this morning.  ROS: Constitutional: ( - ) fevers, ( - )  chills , ( - ) night sweats Ears, nose, mouth, throat, and face: ( - ) mucositis, ( - ) sore throat Respiratory: ( - ) cough, ( + ) dyspnea, ( - ) wheezes Cardiovascular: ( - ) palpitation, ( - ) chest discomfort, ( - ) lower extremity swelling Gastrointestinal:  ( - ) nausea, ( - ) heartburn, ( - ) change in bowel habits Skin: ( - ) abnormal skin rashes Behavioral/Psych: ( - ) mood change, ( - ) new changes  All other systems were reviewed with the patient and are negative.  Objective:  Vitals:   07/01/18 0530 07/01/18 0827  BP: (!) 144/76   Pulse: 68   Resp: 19   Temp: 98.5 F (36.9 C)   SpO2: 94% 94%     Intake/Output Summary (Last 24 hours) at 07/01/2018 1316 Last data filed at 07/01/2018 0900 Gross per 24 hour  Intake 840 ml  Output 2000 ml  Net -1160 ml    GENERAL: alert, no distress and comfortable SKIN: skin  color, texture, turgor are normal, no rashes or significant lesions EYES: conjunctiva are pink and non-injected, sclera clear OROPHARYNX: no exudate, no erythema; lips, buccal mucosa, and tongue normal  NECK: supple, non-tender LUNGS: Clear to auscultation bilaterally.  No wheezes, rales, rhonchi. HEART: regular rate & rhythm and no murmurs and no lower  extremity edema ABDOMEN: soft, non-tender, non-distended, normal bowel sounds PSYCH: alert & oriented x 3, fluent speech   Labs:  Lab Results  Component Value Date   WBC 7.9 07/01/2018   HGB 9.7 (L) 07/01/2018   HCT 28.9 (L) 07/01/2018   MCV 78.5 (L) 07/01/2018   PLT 312 07/01/2018   NEUTROABS 4.1 07/01/2018    Lab Results  Component Value Date   NA 141 07/01/2018   K 3.7 07/01/2018   CL 107 07/01/2018   CO2 24 07/01/2018    Studies:  Dg Chest 2 View  Result Date: 07/01/2018 CLINICAL DATA:  Shortness of breath and cough EXAM: CHEST - 2 VIEW COMPARISON:  June 30, 2018 FINDINGS: There are pleural effusions bilaterally, larger on the left than on the right, stable. There is atelectatic change in the bases with patchy airspace disease in the bases as well, stable. No new opacity evident. Heart is upper normal in size with pulmonary vascularity normal. No adenopathy. No bone lesions. IMPRESSION: Bilateral pleural effusions, larger on the left than on the right, with bibasilar atelectasis and patchy airspace opacity, essentially stable. Stable heart upper normal in size. No adenopathy evident. Electronically Signed   By: Lowella Grip III M.D.   On: 07/01/2018 08:03   Dg Chest 2 View  Result Date: 06/30/2018 CLINICAL DATA:  SHORTNESS OF BREATH.  COUGH.  PLEURAL EFFUSIONS. EXAM: CHEST - 2 VIEW COMPARISON:  TWO-VIEW CHEST X-RAY 06/27/2018 FINDINGS: Heart is enlarged. Left greater than right pleural effusions and basilar airspace disease are again seen. Aeration is improving. Mild pulmonary vascular congestion is present as well. IMPRESSION: 1. Improving aeration of both lungs. 2. Persistent pleural effusions and airspace disease, left greater than right. This remains concerning for infection. Electronically Signed   By: San Morelle M.D.   On: 06/30/2018 08:52

## 2018-07-01 NOTE — TOC Transition Note (Signed)
Transition of Care (TOC) - CM/SW Discharge Note Marvetta Gibbons RN, BSN Transitions of Care Unit 4E- RN Case Manager- cross Coverage for 6E [phone]  Patient Details  Name: Amanda [name] MRN: [id number] Date of Birth: June 25, 1940  Transition of Care [facility]) CM/SW Contact:  Dawayne Patricia, RN Phone Number: 07/01/2018, 4:13 PM   Clinical Narrative:    Pt for transition home today- resumption orders placed for HHRN/PT/SLP and addition of aide- notified Dan with [facility] for resumption of [facility] care.    Final next level of care: [place] Barriers to Discharge: No Barriers Identified   Patient Goals and CMS Choice Patient states their goals for this hospitalization and ongoing recovery are:: "to feel better" CMS Medicare.gov Compare Post Acute Care list provided to:: (Patient currently active- no need for new list wants to return with [facility]) Choice offered to / list presented to : Patient  Discharge Placement  Pt to return home with spouse and [facility] services                     Discharge Plan and Services In-house Referral: NA Discharge Planning Services: CM Consult Post Acute Care Choice: Home Health          DME Arranged: N/A DME Agency: NA       HH Arranged: RN, Disease Management, PT, Nurse's Aide, Speech Therapy HH Agency: [place] ([facility]) Date HH Agency Contacted: 07/01/18 Time [place]: 1612 Representative spoke with at Kevin: [place] ([place]) Interventions     Readmission Risk Interventions Readmission Risk Prevention Plan 07/01/2018 06/30/2018  Transportation Screening - Complete  PCP or Specialist Appt within 3-5 Days Complete -  HRI or [place] - Complete  Social Work Consult for [place] Planning/Counseling Complete -  Palliative Care Screening - Not Applicable  Medication Review Press photographer) - Complete  Some recent data might be hidden

## 2018-07-01 NOTE — Progress Notes (Signed)
Physical Therapy Treatment Patient Details Name: Amanda [name] MRN: [id number] DOB: 1940-07-27 Today's Date: 07/01/2018    History of Present Illness Pt is a 78 y.o. F with significant PMH of stage IV non Hodgkins lymphoma who presents with shortness of breath. Working diagnosis of HCAP    PT Comments    Pt initially reluctant to participate due to lack of sleep however once up and ambulating states " I feel much better." Pt is progressing towards her goals however continues to be limited by generalized weakness and associated decreased stability. D/c plans remain appropriate. Pt hopeful for d/c home today.    Follow Up Recommendations  No PT follow up     Equipment Recommendations  None recommended by PT       Precautions / Restrictions Precautions Precautions: Fall Restrictions Weight Bearing Restrictions: No    Mobility  Bed Mobility Overal bed mobility: Modified Independent                Transfers Overall transfer level: Needs assistance Equipment used: None Transfers: Sit to/from Stand Sit to Stand: Modified independent (Device/Increase time)            Ambulation/Gait Ambulation/Gait assistance: Supervision Gait Distance (Feet): 450 Feet Assistive device: None Gait Pattern/deviations: Step-through pattern;Decreased stride length Gait velocity: decreased Gait velocity interpretation: <1.8 ft/sec, indicate of risk for recurrent falls General Gait Details: supervision for safety, good proximity to RW         Balance Overall balance assessment: Mild deficits observed, not formally tested                                          Cognition Arousal/Alertness: Awake/alert Behavior During Therapy: WFL for tasks assessed/performed Overall Cognitive Status: Within Functional Limits for tasks assessed                                           General Comments General comments (skin integrity, edema, etc.): VSS       Pertinent Vitals/Pain Pain Assessment: No/denies pain           PT Goals (current goals can now be found in the care plan section) Acute Rehab PT Goals Patient Stated Goal: regain strength PT Goal Formulation: With patient Time For Goal Achievement: 07/12/18 Potential to Achieve Goals: Good Progress towards PT goals: Progressing toward goals    Frequency    Min 3X/week      PT Plan Current plan remains appropriate       AM-PAC PT "6 Clicks" Mobility   Outcome Measure  Help needed turning from your back to your side while in a flat bed without using bedrails?: None Help needed moving from lying on your back to sitting on the side of a flat bed without using bedrails?: None Help needed moving to and from a bed to a chair (including a wheelchair)?: None Help needed standing up from a chair using your arms (e.g., wheelchair or bedside chair)?: None Help needed to walk in hospital room?: None Help needed climbing 3-5 steps with a railing? : A Little 6 Click Score: 23    End of Session Equipment Utilized During Treatment: Gait belt Activity Tolerance: Patient tolerated treatment well Patient left: in chair;with call bell/phone within reach;with chair alarm set Nurse Communication: Mobility  status PT Visit Diagnosis: Unsteadiness on feet (R26.81)     Time: 0141-5973 PT Time Calculation (min) (ACUTE ONLY): 18 min  Charges:  $Gait Training: 8-22 mins                     Andromeda Poppen B. Migdalia Dk PT, DPT Acute Rehabilitation Services Pager [phone] Office [phone]    [place] 07/01/2018, 5:03 PM

## 2018-07-01 NOTE — Discharge Summary (Signed)
Physician Discharge Summary  Amanda [name] NOM:[id number] DOB: [birth year] DOA: 06/26/2018  PCP: [name], DO  Admit date: 06/26/2018 Discharge date: 07/01/2018  Admitted From: Inpatient Disposition: home  Recommendations for Outpatient Follow-up:  1. Follow up with PCP in 1-2 weeks 2. Please obtain BMP/CBC in one week 3. Please follow up on the following pending results:  Home Health:No Equipment/Devices:none  Discharge Condition:Stable CODE STATUS:Full code Diet recommendation: Regular healthy diet  Brief/Interim Summary: Per admission: 89GEZ w/ Hx stage IV NHLrecent ADMIT for low grade fever- neg covid, aki, hypercalcemia (06/16/2018 to  06/20/2018), p/w to [facility] with shortness of breath and new oxygen requirement., DOE and at rest, cough but no fever/no wheezing, no sick contacts.Seen by oncologist likely not from [place]. Now in  ER w Rf 39 oxygen sat 88%  RA, WBC 8.1 creat 1.21.  Calcium is 11.0.  Troponin is 0.1 and glucose 99.  Urinalysis is negative.  Chest x-ray showed bilateral basal thickening with some pleural effusion.  BNP 171.  CTA NO PE but Extensive thoracic adenopathy mildly progressive from previous  CT last month.Worsening of lymphoma.  Splenomegaly and upper abdomen adenopathy are grossly stable.  Enlarging bilateral pleural effusions with worsening airspace opacities in both lower lobes the right middle lobe and lingula. Patern is  nonspecific but could be seen in the secondary to superimposed atelectasis,infection or edema.COVID-19 testing was negative. Pt was started on broad spectrumfor antibiotics and was admitted.  Hospital Course: Airspace opacity noted on CT scan with bilateral pleural effusions, patient was initially placed on empiric vancomycin and cefepime, Legionella strep negative, COVID-19 negative, blood cultures negative, antibiotics were stopped, patient stable afebrile and weaned off oxygen.  Findings on CT were thought to be secondary to  volume overload rather than acute infection or worsening non-Hodgkin's lymphoma.  Patient shortness of breath with dyspnea on exertion with acute respiratory failure with hypoxia improved following diuresis.  Should be on steroid taper on discharge.  Non-Hodgkin's lymphoma.  Patient is followed by outpatient oncology.  It was discussed with her oncologist outpatient issues were not secondary to worsening lymphoma.  There was discussion about placing a port although patient declined port placement after requesting it this morning.  She will be discharged home with outpatient follow-up for port placement when she feels better.  It was noted that short patient plans on pursuing a second opinion at [facility].  Hypercalcemia in the setting of NHL.  Patient is seen by endocrinology.  Her calcium has been stable will not add any additional treatment this point time will defer to outpatient follow-up with her oncologist and endocrinologist.  Hypothyroidism.  Patient had no obvious hormonal dysregulation will continue Synthroid at current dosing.  Hypertension also stable on current medication regimen should continue that without change.  Mild elevated troponin.  Patient troponin mildly elevated 0.06 and 0.1 then 0.080.07.  She had no chest pain EKG had no obvious signs of ischemia and echocardiogram showed a normal EF with no acute findings.  This was consistent with demand mismatch in the setting of hypoxic respiratory failure.  No further work-up inpatient she will follow-up with her PCP as an outpatient to discuss outpatient coronary risk stratification.  Discharge Diagnoses:  Principal Problem:   HCAP (healthcare-associated pneumonia) Active Problems:   Hypothyroidism   HLD (hyperlipidemia)   GERD (gastroesophageal reflux disease)   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HCC)   Hypercalcemia   Pleural effusion   SOB (shortness of breath)   Hypoxia  Anemia    Discharge Instructions  Discharge  Instructions    Diet general   Complete by:  As directed    Discharge instructions   Complete by:  As directed    Call PCP or return to ER for any acute change in medical condition   Increase activity slowly   Complete by:  As directed      Allergies as of 07/01/2018      Reactions   Phenergan [promethazine Hcl] Other (See Comments)   Jerking/agitation   Levofloxacin Nausea And Vomiting   Pravastatin Sodium Nausea And Vomiting      Medication List    TAKE these medications   acetaminophen 500 MG tablet Commonly known as:  TYLENOL Take 1,000 mg by mouth every 6 (six) hours as needed for mild pain.   albuterol (2.5 MG/3ML) 0.083% nebulizer solution Commonly known as:  PROVENTIL Take 3 mLs (2.5 mg total) by nebulization every 6 (six) hours as needed for wheezing or shortness of breath.   albuterol 108 (90 Base) MCG/ACT inhaler Commonly known as:  VENTOLIN HFA Inhale 2 puffs into the lungs every 6 (six) hours as needed for up to 30 days for wheezing or shortness of breath.   amLODipine 5 MG tablet Commonly known as:  NORVASC Take 1 tablet (5 mg total) by mouth daily with breakfast.   buPROPion 150 MG 24 hr tablet Commonly known as:  WELLBUTRIN XL TAKE 1 TABLET BY MOUTH EVERY DAY   famotidine 20 MG tablet Commonly known as:  Pepcid One at bedtime What changed:    how much to take  how to take this  when to take this   feeding supplement (ENSURE ENLIVE) Liqd Take 237 mLs by mouth 2 (two) times daily between meals.   levothyroxine 125 MCG tablet Commonly known as:  SYNTHROID TAKE 1 TABLET BY MOUTH DAILY   omeprazole 20 MG capsule Commonly known as:  PRILOSEC Take 2 x 30 min before breakfast What changed:    how much to take  how to take this  when to take this  additional instructions   ondansetron 4 MG tablet Commonly known as:  Zofran Take 1 tablet (4 mg total) by mouth every 8 (eight) hours as needed for nausea or vomiting.   Potassium Chloride  ER 20 MEQ Tbcr Take 20 mEq by mouth daily for 3 days.   predniSONE 10 MG (21) Tbpk tablet Commonly known as:  STERAPRED UNI-PAK 21 TAB Per package insert       Allergies  Allergen Reactions  . Phenergan [Promethazine Hcl] Other (See Comments)    Jerking/agitation  . Levofloxacin Nausea And Vomiting  . Pravastatin Sodium Nausea And Vomiting    Consultations:  oncology   Procedures/Studies: Dg Chest 2 View  Result Date: 07/01/2018 CLINICAL DATA:  Shortness of breath and cough EXAM: CHEST - 2 VIEW COMPARISON:  June 30, 2018 FINDINGS: There are pleural effusions bilaterally, larger on the left than on the right, stable. There is atelectatic change in the bases with patchy airspace disease in the bases as well, stable. No new opacity evident. Heart is upper normal in size with pulmonary vascularity normal. No adenopathy. No bone lesions. IMPRESSION: Bilateral pleural effusions, larger on the left than on the right, with bibasilar atelectasis and patchy airspace opacity, essentially stable. Stable heart upper normal in size. No adenopathy evident. Electronically Signed   By: Lowella Grip III M.D.   On: 07/01/2018 08:03   Dg Chest 2 View  Result  Date: 06/30/2018 CLINICAL DATA:  SHORTNESS OF BREATH.  COUGH.  PLEURAL EFFUSIONS. EXAM: CHEST - 2 VIEW COMPARISON:  TWO-VIEW CHEST X-RAY 06/27/2018 FINDINGS: Heart is enlarged. Left greater than right pleural effusions and basilar airspace disease are again seen. Aeration is improving. Mild pulmonary vascular congestion is present as well. IMPRESSION: 1. Improving aeration of both lungs. 2. Persistent pleural effusions and airspace disease, left greater than right. This remains concerning for infection. Electronically Signed   By: San Morelle M.D.   On: 06/30/2018 08:52   Dg Chest 2 View  Result Date: 06/16/2018 CLINICAL DATA:  Three weeks of coughing. Recent diagnosis of lymphoma. EXAM: CHEST - 2 VIEW COMPARISON:  May 18, 2018  FINDINGS: Continued prominence of the mediastinum consistent with known adenopathy/lymphoma. The cardiomediastinal silhouette is stable. No pneumothorax. Interstitial and parenchymal opacity in the bases persists but is mildly improved since May 18, 2018. No other changes. IMPRESSION: 1. Continued adenopathy in the mediastinum. 2. Continued interstitial and parenchymal opacities in the bases, mildly improved since May 18, 2018. These opacities were favored to represent lymphomatous involvement based on a PET-CT from May 29, 2018. Electronically Signed   By: Dorise Bullion III M.D   On: 06/16/2018 13:09   Ct Angio Chest Pe W And/or Wo Contrast  Result Date: 06/26/2018 CLINICAL DATA:  Shortness of breath with cough and elevated D-dimer levels. Symptoms for several weeks. Recent diagnosis of lymphoma. EXAM: CT ANGIOGRAPHY CHEST WITH CONTRAST TECHNIQUE: Multidetector CT imaging of the chest was performed using the standard protocol during bolus administration of intravenous contrast. Multiplanar CT image reconstructions and MIPs were obtained to evaluate the vascular anatomy. CONTRAST:  32mL OMNIPAQUE IOHEXOL 350 MG/ML SOLN COMPARISON:  Radiographs today, PET-CT 05/29/2018 and chest CT 05/19/2018. FINDINGS: Cardiovascular: The pulmonary arteries are well opacified with contrast to the level of the subsegmental branches. There is no evidence of acute pulmonary embolism. Stable mild atherosclerosis of the aorta, great vessels and coronary arteries. The heart is mildly enlarged. No significant pericardial effusion. Mediastinum/Nodes: Again demonstrated is extensive supraclavicular, mediastinal, hilar and axillary lymphadenopathy bilaterally. Some of these nodes have enlarged compared with the previous study, including a 1.7 cm right axillary node on image 27/6 and a 1.9 cm left axillary node on image 40/6. 2.3 cm AP window node on image 30/6 and 2.5 cm subcarinal node on image 50/6 are similar to the previous  study. The thyroid gland, trachea and esophagus demonstrate no significant findings. Lungs/Pleura: There are enlarging, moderate size dependent bilateral pleural effusions. There is worsening aeration of the lungs. Chronic collapse and bronchiectasis in the right middle lobe and lingula have progressed. There is new partial collapse and opacification of both lower lobes. Underlying nodularity within the aerated portions of the lungs is grossly stable. Upper abdomen: There are multiple mildly enlarged lymph nodes within the porta hepatis and upper retroperitoneum. Stable mild splenomegaly without focal abnormality. There is mild reflux of contrast into the hepatic vein and IVC. Musculoskeletal/Chest wall: There is no chest wall mass or suspicious osseous finding. Review of the MIP images confirms the above findings. IMPRESSION: 1. No evidence of acute pulmonary embolism. 2. Extensive thoracic adenopathy, mildly progressive from previous CT of last month, consistent with slight worsening of lymphoma. Splenomegaly and upper abdominal adenopathy are grossly stable. 3. Enlarging bilateral pleural effusions with worsening airspace opacities in both lower lobes, the right middle lobe and lingula. Underlying pulmonary nodularity is grossly stable. Pattern is nonspecific and could be secondary to superimposed atelectasis, infection or  edema. Electronically Signed   By: Richardean Sale M.D.   On: 06/26/2018 17:37   Dg Chest Port 1 View  Result Date: 06/27/2018 CLINICAL DATA:  Current history of lymphoma with shortness of breath and cough. Follow-up effusions and possible pneumonia. EXAM: PORTABLE CHEST 1 VIEW COMPARISON:  CTA chest yesterday. Chest x-rays yesterday and earlier. FINDINGS: Cardiac silhouette mildly enlarged, unchanged. Thoracic aorta ectatic and atherosclerotic, unchanged. Since yesterday, no change in the moderately large BILATERAL pleural effusions, LEFT greater than RIGHT, and the airspace consolidation  in the BILATERAL LOWER LOBES, RIGHT MIDDLE LOBE and lingula. Pulmonary vascularity normal. 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 IMPRESSION: 1. Stable moderately large BILATERAL pleural effusions, LEFT greater than RIGHT, and associated passive atelectasis and/or pneumonia in the BILATERAL LOWER LOBES, RIGHT MIDDLE LOBE and lingula. 2. Interstitial opacities throughout both lungs, unchanged since yesterday's examinations but new since 06/16/2018, query lymphangitic carcinomatosis. Interstitial edema is felt less likely since pulmonary vascularity is normal. Electronically Signed   By: Evangeline Dakin M.D.   On: 06/27/2018 19:11   Dg Chest Port 1 View  Result Date: 06/26/2018 CLINICAL DATA:  Cough, shortness of breath EXAM: PORTABLE CHEST 1 VIEW COMPARISON:  06/16/2018 FINDINGS: Bilateral mild interstitial thickening increased from the prior exam. Bilateral small pleural effusions. No pneumothorax. Stable cardiomediastinal silhouette. No acute osseous abnormality. IMPRESSION: 1. Bilateral interstitial thickening primarily at the lung bases and small pleural effusions. Differential considerations include pulmonary edema versus pneumonia. Electronically Signed   By: Kathreen Devoid   On: 06/26/2018 12:45       Subjective: Patient reports she feels well this morning.  After wanted to pursue port placement this morning she is elected at 345 this afternoon to not to wait for port placement but requested to be discharged home.  She is been weaned off oxygen is stable and feels at her baseline.  Discharge Exam: Vitals:   07/01/18 0827 07/01/18 1500  BP:  130/76  Pulse:  82  Resp:  (!) 22  Temp:  98.5 F (36.9 C)  SpO2: 94% 96%   Vitals:   06/30/18 2128 07/01/18 0530 07/01/18 0827 07/01/18 1500  BP:  (!) 151/95 (!) 144/76  130/76  Pulse: 80 68  82  Resp: 17 19  (!) 22  Temp: (!) 97.3 F (36.3 C) 98.5 F (36.9 C)  98.5 F (36.9 C)  TempSrc: Oral Oral  Oral  SpO2: 94% 94% 94% 96%  Weight:  63.5 kg    Height:        General: Pt is alert, awake, not in acute distress Cardiovascular: RRR, S1/S2 +, no rubs, no gallops Respiratory: CTA bilaterally, no wheezing, no rhonchi Abdominal: Soft, NT, ND, bowel sounds + Extremities: no edema, no cyanosis    The results of significant diagnostics from this hospitalization (including imaging, microbiology, ancillary and laboratory) are listed below for reference.     Microbiology: Recent Results (from the past 240 hour(s))  SARS Coronavirus 2 [facility] order, Performed in [facility] hospital lab)     Status: None   Collection Time: 06/26/18 12:06 PM  Result Value Ref Range Status   SARS Coronavirus 2 NEGATIVE NEGATIVE Final    Comment: (NOTE) If result is NEGATIVE SARS-CoV-2 target nucleic acids are NOT DETECTED. The SARS-CoV-2 RNA is generally detectable in upper and lower  respiratory specimens during the acute  phase of infection. The lowest  concentration of SARS-CoV-2 viral copies this assay can detect is 250  copies / mL. A negative result does not preclude SARS-CoV-2 infection  and should not be used as the sole basis for treatment or other  patient management decisions.  A negative result may occur with  improper specimen collection / handling, submission of specimen other  than nasopharyngeal swab, presence of viral mutation(s) within the  areas targeted by this assay, and inadequate number of viral copies  (<250 copies / mL). A negative result must be combined with clinical  observations, patient history, and epidemiological information. If result is POSITIVE SARS-CoV-2 target nucleic acids are DETECTED. The SARS-CoV-2 RNA is generally detectable in upper and lower  respiratory specimens dur ing the acute phase of  infection.  Positive  results are indicative of active infection with SARS-CoV-2.  Clinical  correlation with patient history and other diagnostic information is  necessary to determine patient infection status.  Positive results do  not rule out bacterial infection or co-infection with other viruses. If result is PRESUMPTIVE POSTIVE SARS-CoV-2 nucleic acids MAY BE PRESENT.   A presumptive positive result was obtained on the submitted specimen  and confirmed on repeat testing.  While 2019 novel coronavirus  (SARS-CoV-2) nucleic acids may be present in the submitted sample  additional confirmatory testing may be necessary for epidemiological  and / or clinical management purposes  to differentiate between  SARS-CoV-2 and other Sarbecovirus currently known to infect humans.  If clinically indicated additional testing with an alternate test  methodology [phone]) is advised. The SARS-CoV-2 RNA is generally  detectable in upper and lower respiratory sp ecimens during the acute  phase of infection. The expected result is Negative. Fact Sheet for Patients:  StrictlyIdeas.no Fact Sheet for Healthcare Providers: BankingDealers.co.za This test is not yet approved or cleared by the [place] FDA and has been authorized for detection and/or diagnosis of SARS-CoV-2 by FDA under an Emergency Use Authorization (EUA).  This EUA will remain in effect (meaning this test can be used) for the duration of the COVID-19 declaration under Section 564(b)(1) of the Act, 21 U.S.C. section 360bbb-3(b)(1), unless the authorization is terminated or revoked sooner. Performed at [facility], [place] [address], [place], [place] [address]   Culture, blood (routine x 2) Call MD if unable to obtain prior to antibiotics being given     Status: None   Collection Time: 06/26/18 10:34 PM  Result Value Ref Range Status   Specimen Description BLOOD LEFT ARM  Final    Special Requests   Final    BOTTLES DRAWN AEROBIC ONLY Blood Culture adequate volume   Culture   Final    NO GROWTH 5 DAYS Performed at [facility], [place] [address] [place], [place] [address]    Report Status 07/01/2018 FINAL  Final  Culture, blood (routine x 2) Call MD if unable to obtain prior to antibiotics being given     Status: None   Collection Time: 06/26/18 10:38 PM  Result Value Ref Range Status   Specimen Description BLOOD LEFT HAND  Final   Special Requests   Final    BOTTLES DRAWN AEROBIC AND ANAEROBIC Blood Culture adequate volume   Culture   Final    NO GROWTH 5 DAYS Performed at [facility], [place] [address] [place], [place] [address]    Report Status 07/01/2018 FINAL  Final  Respiratory Panel by PCR  Status: None   Collection Time: 06/29/18  3:00 PM  Result Value Ref Range Status   Adenovirus NOT DETECTED NOT DETECTED Final   Coronavirus 229E NOT DETECTED NOT DETECTED Final    Comment: (NOTE) The Coronavirus on the Respiratory Panel, DOES NOT test for the novel  Coronavirus (2019 nCoV)    Coronavirus HKU1 NOT DETECTED NOT DETECTED Final   Coronavirus NL63 NOT DETECTED NOT DETECTED Final   Coronavirus OC43 NOT DETECTED NOT DETECTED Final   Metapneumovirus NOT DETECTED NOT DETECTED Final   Rhinovirus / Enterovirus NOT DETECTED NOT DETECTED Final   Influenza A NOT DETECTED NOT DETECTED Final   Influenza B NOT DETECTED NOT DETECTED Final   Parainfluenza Virus 1 NOT DETECTED NOT DETECTED Final   Parainfluenza Virus 2 NOT DETECTED NOT DETECTED Final   Parainfluenza Virus 3 NOT DETECTED NOT DETECTED Final   Parainfluenza Virus 4 NOT DETECTED NOT DETECTED Final   Respiratory Syncytial Virus NOT DETECTED NOT DETECTED Final   Bordetella pertussis NOT DETECTED NOT DETECTED Final   Chlamydophila pneumoniae NOT DETECTED NOT DETECTED Final   Mycoplasma pneumoniae NOT DETECTED NOT DETECTED Final    Comment: Performed at [facility], [place] [address] [place], [place] [address]     Labs: BNP (last 3 results) Recent Labs    06/05/18 1700 06/26/18 1147  BNP 72 366.2*   Basic Metabolic Panel: Recent Labs  Lab 06/27/18 1856 06/28/18 0313 06/29/18 0449 06/30/18 0348 07/01/18 0247  NA 137 137 140 140 141  K 3.7 3.8 3.5 3.5 3.7  CL 105 105 110 108 107  CO2 20* 20* 21* 23 24  GLUCOSE 110* 148* 180* 112* 109*  BUN 12 12 20  26* 26*  CREATININE 1.12* 1.10* 1.07* 1.04* 1.15*  CALCIUM 10.2 10.0 10.3 11.4* 11.2*   Liver Function Tests: Recent Labs  Lab 06/26/18 1147 06/27/18 0450 06/27/18 1856  AST 28 22 28   ALT 15 13 17   ALKPHOS 72 61 65  BILITOT 0.8 0.7 0.7  PROT 6.6 5.0* 5.4*  ALBUMIN 3.6 2.8* 2.9*   No results for input(s): LIPASE, AMYLASE in the last 168 hours. No results for input(s): AMMONIA in the last 168 hours. CBC: Recent Labs  Lab 06/26/18 1147 06/26/18 2234 06/28/18 0313 06/29/18 0449 06/30/18 0348 07/01/18 0247  WBC 8.1 7.4 5.3 6.3 9.2 7.9  NEUTROABS 5.0  --   --   --   --  4.1  HGB 11.6* 11.0* 10.2* 9.3* 9.3* 9.7*  HCT 36.1 32.7* 30.7* 28.5* 28.9* 28.9*  MCV 80.9 78.4* 78.9* 79.4* 79.4* 78.5*  PLT 247 237 234 256 290 312   Cardiac Enzymes: Recent Labs  Lab 06/26/18 1534 06/27/18 0803 06/27/18 1027 06/27/18 1344 06/27/18 1856  TROPONINI 0.10* 0.08* 0.07* 0.07* 0.07*   BNP: Invalid input(s): POCBNP CBG: No results for input(s): GLUCAP in the last 168 hours. D-Dimer No results for input(s): DDIMER in the last 72 hours. Hgb A1c No results for input(s): HGBA1C in the last 72 hours. Lipid Profile No results for input(s): CHOL, HDL, LDLCALC, TRIG, CHOLHDL, LDLDIRECT in the last 72 hours. Thyroid function studies No results for input(s): TSH, T4TOTAL, T3FREE, THYROIDAB in the last 72 hours.  Invalid input(s): FREET3 Anemia work up No results for input(s): VITAMINB12, FOLATE, FERRITIN, TIBC, IRON, RETICCTPCT in the last 72 hours. Urinalysis    Component Value Date/Time   COLORURINE  YELLOW 06/26/2018 1301   APPEARANCEUR CLEAR 06/26/2018 1301   LABSPEC 1.015 06/26/2018 1301   PHURINE  6.0 06/26/2018 1301   GLUCOSEU NEGATIVE 06/26/2018 1301   HGBUR NEGATIVE 06/26/2018 1301   BILIRUBINUR NEGATIVE 06/26/2018 1301   BILIRUBINUR neg 06/05/2015 1654   KETONESUR NEGATIVE 06/26/2018 1301   PROTEINUR NEGATIVE 06/26/2018 1301   UROBILINOGEN 0.2 06/05/2015 1654   UROBILINOGEN 1.0 11/15/2014 1625   NITRITE NEGATIVE 06/26/2018 1301   LEUKOCYTESUR TRACE (A) 06/26/2018 1301   Sepsis Labs Invalid input(s): PROCALCITONIN,  WBC,  LACTICIDVEN Microbiology Recent Results (from the past 240 hour(s))  SARS Coronavirus 2 [facility] order, Performed in [place] hospital lab)     Status: None   Collection Time: 06/26/18 12:06 PM  Result Value Ref Range Status   SARS Coronavirus 2 NEGATIVE NEGATIVE Final    Comment: (NOTE) If result is NEGATIVE SARS-CoV-2 target nucleic acids are NOT DETECTED. The SARS-CoV-2 RNA is generally detectable in upper and lower  respiratory specimens during the acute phase of infection. The lowest  concentration of SARS-CoV-2 viral copies this assay can detect is 250  copies / mL. A negative result does not preclude SARS-CoV-2 infection  and should not be used as the sole basis for treatment or other  patient management decisions.  A negative result may occur with  improper specimen collection / handling, submission of specimen other  than nasopharyngeal swab, presence of viral mutation(s) within the  areas targeted by this assay, and inadequate number of viral copies  (<250 copies / mL). A negative result must be combined with clinical  observations, patient history, and epidemiological information. If result is POSITIVE SARS-CoV-2 target nucleic acids are DETECTED. The SARS-CoV-2 RNA is generally detectable in upper and lower  respiratory specimens dur ing the acute phase of infection.  Positive  results are indicative of active infection with  SARS-CoV-2.  Clinical  correlation with patient history and other diagnostic information is  necessary to determine patient infection status.  Positive results do  not rule out bacterial infection or co-infection with other viruses. If result is PRESUMPTIVE POSTIVE SARS-CoV-2 nucleic acids MAY BE PRESENT.   A presumptive positive result was obtained on the submitted specimen  and confirmed on repeat testing.  While 2019 novel coronavirus  (SARS-CoV-2) nucleic acids may be present in the submitted sample  additional confirmatory testing may be necessary for epidemiological  and / or clinical management purposes  to differentiate between  SARS-CoV-2 and other Sarbecovirus currently known to infect humans.  If clinically indicated additional testing with an alternate test  methodology [phone]) is advised. The SARS-CoV-2 RNA is generally  detectable in upper and lower respiratory sp ecimens during the acute  phase of infection. The expected result is Negative. Fact Sheet for Patients:  StrictlyIdeas.no Fact Sheet for Healthcare Providers: BankingDealers.co.za This test is not yet approved or cleared by the [place] FDA and has been authorized for detection and/or diagnosis of SARS-CoV-2 by FDA under an Emergency Use Authorization (EUA).  This EUA will remain in effect (meaning this test can be used) for the duration of the COVID-19 declaration under Section 564(b)(1) of the Act, 21 U.S.C. section 360bbb-3(b)(1), unless the authorization is terminated or revoked sooner. Performed at [facility], [place] [address] [place], [place] [address]   Culture, blood (routine x 2) Call MD if unable to obtain prior to antibiotics being given     Status: None   Collection Time: 06/26/18 10:34 PM  Result Value Ref Range Status   Specimen Description BLOOD LEFT ARM  Final   Special Requests   Final  BOTTLES DRAWN AEROBIC ONLY Blood Culture  adequate volume   Culture   Final    NO GROWTH 5 DAYS Performed at [facility], [place] [address] [place], [place] [address]    Report Status 07/01/2018 FINAL  Final  Culture, blood (routine x 2) Call MD if unable to obtain prior to antibiotics being given     Status: None   Collection Time: 06/26/18 10:38 PM  Result Value Ref Range Status   Specimen Description BLOOD LEFT HAND  Final   Special Requests   Final    BOTTLES DRAWN AEROBIC AND ANAEROBIC Blood Culture adequate volume   Culture   Final    NO GROWTH 5 DAYS Performed at [facility], [place] [address] [place], [place] [address]    Report Status 07/01/2018 FINAL  Final  Respiratory Panel by PCR     Status: None   Collection Time: 06/29/18  3:00 PM  Result Value Ref Range Status   Adenovirus NOT DETECTED NOT DETECTED Final   Coronavirus 229E NOT DETECTED NOT DETECTED Final    Comment: (NOTE) The Coronavirus on the Respiratory Panel, DOES NOT test for the novel  Coronavirus (2019 nCoV)    Coronavirus HKU1 NOT DETECTED NOT DETECTED Final   Coronavirus NL63 NOT DETECTED NOT DETECTED Final   Coronavirus OC43 NOT DETECTED NOT DETECTED Final   Metapneumovirus NOT DETECTED NOT DETECTED Final   Rhinovirus / Enterovirus NOT DETECTED NOT DETECTED Final   Influenza A NOT DETECTED NOT DETECTED Final   Influenza B NOT DETECTED NOT DETECTED Final   Parainfluenza Virus 1 NOT DETECTED NOT DETECTED Final   Parainfluenza Virus 2 NOT DETECTED NOT DETECTED Final   Parainfluenza Virus 3 NOT DETECTED NOT DETECTED Final   Parainfluenza Virus 4 NOT DETECTED NOT DETECTED Final   Respiratory Syncytial Virus NOT DETECTED NOT DETECTED Final   Bordetella pertussis NOT DETECTED NOT DETECTED Final   Chlamydophila pneumoniae NOT DETECTED NOT DETECTED Final   Mycoplasma pneumoniae NOT DETECTED NOT DETECTED Final    Comment: Performed at [facility], [place] [address], [place], [place] [address]     Time coordinating discharge: 35  minutes  SIGNED:   Nicolette Bang, MD  [facility] 07/01/2018, 3:42 PM Pager   If 7PM-7AM, please contact night-coverage [email or web address] Password [email or web address]

## 2018-07-02 ENCOUNTER — Other Ambulatory Visit: Payer: Self-pay | Admitting: Family Medicine

## 2018-07-02 DIAGNOSIS — C8590 Non-Hodgkin lymphoma, unspecified, unspecified site: Secondary | ICD-10-CM | POA: Diagnosis not present

## 2018-07-02 DIAGNOSIS — N183 Chronic kidney disease, stage 3 (moderate): Secondary | ICD-10-CM | POA: Diagnosis not present

## 2018-07-02 DIAGNOSIS — Z8701 Personal history of pneumonia (recurrent): Secondary | ICD-10-CM | POA: Diagnosis not present

## 2018-07-02 DIAGNOSIS — I129 Hypertensive chronic kidney disease with stage 1 through stage 4 chronic kidney disease, or unspecified chronic kidney disease: Secondary | ICD-10-CM | POA: Diagnosis not present

## 2018-07-02 DIAGNOSIS — Z5181 Encounter for therapeutic drug level monitoring: Secondary | ICD-10-CM | POA: Diagnosis not present

## 2018-07-03 ENCOUNTER — Telehealth: Payer: Self-pay

## 2018-07-03 ENCOUNTER — Telehealth: Payer: Self-pay | Admitting: *Deleted

## 2018-07-03 ENCOUNTER — Telehealth: Payer: Self-pay | Admitting: Family Medicine

## 2018-07-03 ENCOUNTER — Other Ambulatory Visit: Payer: Self-pay | Admitting: Family Medicine

## 2018-07-03 ENCOUNTER — Encounter: Payer: Self-pay | Admitting: *Deleted

## 2018-07-03 DIAGNOSIS — Z8701 Personal history of pneumonia (recurrent): Secondary | ICD-10-CM

## 2018-07-03 DIAGNOSIS — N183 Chronic kidney disease, stage 3 (moderate): Secondary | ICD-10-CM

## 2018-07-03 DIAGNOSIS — I129 Hypertensive chronic kidney disease with stage 1 through stage 4 chronic kidney disease, or unspecified chronic kidney disease: Secondary | ICD-10-CM

## 2018-07-03 DIAGNOSIS — K121 Other forms of stomatitis: Secondary | ICD-10-CM

## 2018-07-03 DIAGNOSIS — C8590 Non-Hodgkin lymphoma, unspecified, unspecified site: Secondary | ICD-10-CM

## 2018-07-03 DIAGNOSIS — Z5181 Encounter for therapeutic drug level monitoring: Secondary | ICD-10-CM

## 2018-07-03 MED ORDER — MAGIC MOUTHWASH W/LIDOCAINE: 5.0000 mL | Freq: Four times a day (QID) | ORAL | 0 refills | Status: DC | PRN

## 2018-07-03 NOTE — Telephone Encounter (Signed)
Copied from [place] [phone]. Topic: Quick Communication - Home Health Verbal Orders >> Jul 03, 2018  4:25 PM Luciana Axe wrote: Caller/Agency: [facility] Callback Number: [phone] ok to leave verbal on voice mail Requesting OT/PT/Skilled Nursing/Social Work/Speech Therapy: Resuming PT  Frequency: 1 time a week 2 weeks, 2 x a week 2 week, 1x a week for 2 weeks.

## 2018-07-03 NOTE — Progress Notes (Signed)
Patient is now at home after recent hospitalization and feels better. She is ready to schedule her port placement. She has emailed me with this request  Spoke with Dr Maylon Peppers. He will place orders for port placement after her appointment on Monday when she has her second opinion at [facility]. With a second opinion, treatment hasn't been definitively decided and Dr Maylon Peppers does not want to get a port if unnecessary.   Let patient know that order will be placed after her appointment on Monday, if the choice is to proceed with treatment.

## 2018-07-03 NOTE — Telephone Encounter (Signed)
Ok to refill--- it will not send printed

## 2018-07-03 NOTE — Telephone Encounter (Signed)
07/03/18  Transition Care Management Follow-up Telephone Call  ADMISSION DATE: 06/26/18  DISCHARGE DATE: 07/01/18  BMP/CBC per hospital discharge. Orders placed.     How have you been since you were released from the hospital? Feeling much better per patient    Do you understand why you were in the hospital? Yes   Do you understand the discharge instrcutions? Yes    Items Reviewed: Medications reviewed: Yes   Allergies reviewed: Yes     Dietary changes reviewed: Regular diet.   Referrals reviewed: Appointment scheduled, [facility] and Pulmonologist scheduled.   Functional Questionnaire:   Activities of Daily Living (ADLs): Patient states she can perform all independently.  Any patient concerns? Treatment schedule  Confirmed importance and date/time of follow-up visits scheduled:Yes   Confirmed with patient if condition begins to worsen call PCP or go to the ER. Yes     Patient was given the office number and encouragred to call back with questions or concerns.Yes

## 2018-07-03 NOTE — Telephone Encounter (Signed)
Pharmacy requests a refill on magic mouthwash.

## 2018-07-05 DIAGNOSIS — Z8701 Personal history of pneumonia (recurrent): Secondary | ICD-10-CM | POA: Diagnosis not present

## 2018-07-05 DIAGNOSIS — Z5181 Encounter for therapeutic drug level monitoring: Secondary | ICD-10-CM | POA: Diagnosis not present

## 2018-07-05 DIAGNOSIS — I129 Hypertensive chronic kidney disease with stage 1 through stage 4 chronic kidney disease, or unspecified chronic kidney disease: Secondary | ICD-10-CM | POA: Diagnosis not present

## 2018-07-05 DIAGNOSIS — N183 Chronic kidney disease, stage 3 (moderate): Secondary | ICD-10-CM | POA: Diagnosis not present

## 2018-07-05 DIAGNOSIS — C8590 Non-Hodgkin lymphoma, unspecified, unspecified site: Secondary | ICD-10-CM | POA: Diagnosis not present

## 2018-07-06 ENCOUNTER — Encounter: Payer: Self-pay | Admitting: *Deleted

## 2018-07-06 DIAGNOSIS — C8580 Other specified types of non-Hodgkin lymphoma, unspecified site: Secondary | ICD-10-CM | POA: Diagnosis not present

## 2018-07-06 DIAGNOSIS — C8590 Non-Hodgkin lymphoma, unspecified, unspecified site: Secondary | ICD-10-CM | POA: Diagnosis not present

## 2018-07-06 MED ORDER — MAGIC MOUTHWASH W/LIDOCAINE: 5.0000 mL | Freq: Four times a day (QID) | ORAL | 0 refills | Status: DC | PRN

## 2018-07-06 NOTE — Telephone Encounter (Signed)
Refill faxed to pharmacy.

## 2018-07-06 NOTE — Telephone Encounter (Signed)
agree

## 2018-07-06 NOTE — Addendum Note (Signed)
Addended by: Kem Boroughs D on: 07/06/2018 09:58 AM   Modules accepted: Orders

## 2018-07-06 NOTE — Telephone Encounter (Signed)
Verbal authorization given to proceed with orders below. Please advise if any different recommendation?

## 2018-07-07 ENCOUNTER — Telehealth: Payer: Self-pay | Admitting: *Deleted

## 2018-07-07 ENCOUNTER — Telehealth: Payer: Self-pay

## 2018-07-07 ENCOUNTER — Other Ambulatory Visit: Payer: Self-pay

## 2018-07-07 ENCOUNTER — Other Ambulatory Visit (INDEPENDENT_AMBULATORY_CARE_PROVIDER_SITE_OTHER): Payer: Medicare Other

## 2018-07-07 ENCOUNTER — Telehealth: Payer: Self-pay | Admitting: Family Medicine

## 2018-07-07 DIAGNOSIS — R790 Abnormal level of blood mineral: Secondary | ICD-10-CM | POA: Diagnosis not present

## 2018-07-07 LAB — CBC WITH DIFFERENTIAL/PLATELET
Basophils Absolute: 0 10*3/uL (ref 0.0–0.1)
Basophils Relative: 0.1 % (ref 0.0–3.0)
Eosinophils Absolute: 0 10*3/uL (ref 0.0–0.7)
Eosinophils Relative: 0.3 % (ref 0.0–5.0)
HCT: 34.5 % — ABNORMAL LOW (ref 36.0–46.0)
Hemoglobin: 11.5 g/dL — ABNORMAL LOW (ref 12.0–15.0)
Lymphocytes Relative: 15.3 % (ref 12.0–46.0)
Lymphs Abs: 1.1 10*3/uL (ref 0.7–4.0)
MCHC: 33.3 g/dL (ref 30.0–36.0)
MCV: 81.1 fl (ref 78.0–100.0)
Monocytes Absolute: 0.7 10*3/uL (ref 0.1–1.0)
Monocytes Relative: 9.4 % (ref 3.0–12.0)
Neutro Abs: 5.4 10*3/uL (ref 1.4–7.7)
Neutrophils Relative %: 74.9 % (ref 43.0–77.0)
Platelets: 394 10*3/uL (ref 150.0–400.0)
RBC: 4.25 Mil/uL (ref 3.87–5.11)
RDW: 16.8 % — ABNORMAL HIGH (ref 11.5–15.5)
WBC: 7.1 10*3/uL (ref 4.0–10.5)

## 2018-07-07 LAB — COMPREHENSIVE METABOLIC PANEL
ALT: 24 U/L (ref 0–35)
AST: 17 U/L (ref 0–37)
Albumin: 3.9 g/dL (ref 3.5–5.2)
Alkaline Phosphatase: 65 U/L (ref 39–117)
BUN: 30 mg/dL — ABNORMAL HIGH (ref 6–23)
CO2: 32 mEq/L (ref 19–32)
Calcium: 10.3 mg/dL (ref 8.4–10.5)
Chloride: 102 mEq/L (ref 96–112)
Creatinine, Ser: 1.15 mg/dL (ref 0.40–1.20)
GFR: 45.46 mL/min — ABNORMAL LOW (ref >60.00)
Glucose, Bld: 110 mg/dL — ABNORMAL HIGH (ref 70–99)
Potassium: 4.4 mEq/L (ref 3.5–5.1)
Sodium: 143 mEq/L (ref 135–145)
Total Bilirubin: 0.5 mg/dL (ref 0.2–1.2)
Total Protein: 5.8 g/dL — ABNORMAL LOW (ref 6.0–8.3)

## 2018-07-07 LAB — PHOSPHORUS: Phosphorus: 3.4 mg/dL (ref 2.3–4.6)

## 2018-07-07 NOTE — Telephone Encounter (Signed)
Amanda [name]- have you seen orders?

## 2018-07-07 NOTE — Telephone Encounter (Signed)
Spoke w/ Jim- verbal orders given.  

## 2018-07-07 NOTE — Telephone Encounter (Signed)
Copied from [place] [phone]. Topic: Quick Communication - Home Health Verbal Orders >> Jul 07, 2018  2:43 PM Alanda Slim E wrote: Caller/Agency: [facility] Number: [phone] vm can be left  Requesting skilled nursing  Frequency: 1x a week for 7 weeks  He will also accept the verbal orders for Speech therapy that was previously placed

## 2018-07-07 NOTE — Telephone Encounter (Signed)
Received Physician Orders from [facility]; forwarded to provider/SLS 05/05

## 2018-07-07 NOTE — Telephone Encounter (Signed)
Copied from [place] [phone]. Topic: General - Inquiry >> Jul 07, 2018  9:05 AM Virl Axe D wrote: Reason for CRM: Sonia Baller with [facility] faxed over a [place] request for pt on 07/02/18. She is faxing it over again this morning. She needs to have it faxed back as soon as possible as this request has to have something in writing. Please advise. [phone]

## 2018-07-07 NOTE — Telephone Encounter (Signed)
Received Home Health Discharge-Transfer Summary for review from [facility]; forwarded to provider/SLS 05/05

## 2018-07-08 ENCOUNTER — Encounter: Payer: Self-pay | Admitting: *Deleted

## 2018-07-08 NOTE — Progress Notes (Signed)
Checked in with patient after her second opinion. She states she received lot of good information and has a follow up phone conference this Friday. She states she is planning on Dr Maylon Peppers caring and treating her here in this office once a final plan is made.  She is also of the impression that Dr Maylon Peppers will be included in her phone conference on Friday. I have sent Dr Maylon Peppers a message to confirm this. At this time, he doesn't have an invite to be part of this conference.

## 2018-07-09 ENCOUNTER — Ambulatory Visit (INDEPENDENT_AMBULATORY_CARE_PROVIDER_SITE_OTHER): Payer: Medicare Other | Admitting: Family Medicine

## 2018-07-09 ENCOUNTER — Encounter: Payer: Self-pay | Admitting: Family Medicine

## 2018-07-09 DIAGNOSIS — C8592 Non-Hodgkin lymphoma, unspecified, intrathoracic lymph nodes: Secondary | ICD-10-CM | POA: Diagnosis not present

## 2018-07-09 DIAGNOSIS — Z5181 Encounter for therapeutic drug level monitoring: Secondary | ICD-10-CM | POA: Diagnosis not present

## 2018-07-09 DIAGNOSIS — C8590 Non-Hodgkin lymphoma, unspecified, unspecified site: Secondary | ICD-10-CM | POA: Diagnosis not present

## 2018-07-09 DIAGNOSIS — K121 Other forms of stomatitis: Secondary | ICD-10-CM | POA: Diagnosis not present

## 2018-07-09 DIAGNOSIS — I129 Hypertensive chronic kidney disease with stage 1 through stage 4 chronic kidney disease, or unspecified chronic kidney disease: Secondary | ICD-10-CM | POA: Diagnosis not present

## 2018-07-09 DIAGNOSIS — R0902 Hypoxemia: Secondary | ICD-10-CM | POA: Diagnosis not present

## 2018-07-09 DIAGNOSIS — Z8701 Personal history of pneumonia (recurrent): Secondary | ICD-10-CM | POA: Diagnosis not present

## 2018-07-09 DIAGNOSIS — N183 Chronic kidney disease, stage 3 (moderate): Secondary | ICD-10-CM | POA: Diagnosis not present

## 2018-07-09 NOTE — Assessment & Plan Note (Signed)
Felt to be from fluid overload

## 2018-07-09 NOTE — Progress Notes (Signed)
Virtual Visit via Video Note  I connected with Amanda [name] on 07/09/18 at 11:30 AM EDT by a video enabled telemedicine application and verified that I am speaking with the correct person using two identifiers.  Location: Patient: home--- her husband lonnie is with her  Provider: home   I discussed the limitations of evaluation and management by telemedicine and the availability of in person appointments. The patient expressed understanding and agreed to proceed.  History of Present Illness: Pt is home with no complaints.  She is feeling much better  She was admitted to the [facility] 4/24-4/29 with hypoxia and weakness.  She  went to [facility] with these symptoms and covid neg, she has low grade fever and needed oxygen.  It was felt her symptoms were not from NHL--oncologist saw her in er.  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  Pt was admitted and started on abx. abx stopped when blood work/ blood cultures neg.  She was weaned off oxygen   She was started on lasix for pulm edema.-- it was felt it was not her NHL or pneumonia.  Sob and dyspnea improved with diuresis.  She was d/c with steroid taper.     Pt had hypercalcemia she is being seen by endocrinology.   Repeat done this week was normal. bp was stable in the hospital  Past Medical History:  Diagnosis Date  . Anemia   . Arthritis   . Cancer ([place])   . Constipation, chronic   . GERD (gastroesophageal reflux disease)    zantac  . Heart murmur   . History of blood transfusion [place]  . Hyperlipidemia   . Hypothyroidism   . Lymphoproliferative disorder ([place])   . Macular degeneration 2013   Both eyes   . Osteopenia   . Pneumonia   . PONV (postoperative nausea and vomiting)    needs little anesthesia  . Shingles   . Shortness of breath    on exertion  . Spleen enlarged   . SUI (stress urinary incontinence, female)   . Wears glasses    Current Outpatient Medications on File Prior to Visit  Medication Sig  Dispense Refill  . acetaminophen (TYLENOL) 500 MG tablet Take 1,000 mg by mouth every 6 (six) hours as needed for mild pain.    [name] albuterol (PROVENTIL HFA;VENTOLIN HFA) 108 (90 Base) MCG/ACT inhaler Inhale 2 puffs into the lungs every 6 (six) hours as needed for up to 30 days for wheezing or shortness of breath. 1 Inhaler 2  . albuterol (PROVENTIL) (2.5 MG/3ML) 0.083% nebulizer solution Take 3 mLs (2.5 mg total) by nebulization every 6 (six) hours as needed for wheezing or shortness of breath. 75 mL 12  . amLODipine (NORVASC) 5 MG tablet Take 1 tablet (5 mg total) by mouth daily with breakfast. 30 tablet 0  . buPROPion (WELLBUTRIN XL) 150 MG 24 hr tablet TAKE 1 TABLET BY MOUTH EVERY DAY 90 tablet 1  . famotidine (PEPCID) 20 MG tablet One at bedtime (Patient taking differently: Take 20 mg by mouth at bedtime. One at bedtime ) 30 tablet 11  . feeding supplement, ENSURE ENLIVE, (ENSURE ENLIVE) LIQD Take 237 mLs by mouth 2 (two) times daily between meals. 60 Bottle 0  . levothyroxine (SYNTHROID, LEVOTHROID) 125 MCG tablet TAKE 1 TABLET BY MOUTH DAILY 90 tablet 0  . magic mouthwash w/lidocaine SOLN Take 5 mLs by mouth 4 (four) times daily as needed for mouth pain. Swish and  Spit 240 mL 0  . omeprazole (PRILOSEC) 20 MG capsule Take 2 x 30 min before breakfast (Patient taking differently: Take 20 mg by mouth daily. )    . ondansetron (ZOFRAN) 4 MG tablet Take 1 tablet (4 mg total) by mouth every 8 (eight) hours as needed for nausea or vomiting. 20 tablet 0  . potassium chloride 20 MEQ TBCR Take 20 mEq by mouth daily for 3 days. 3 tablet 0  . predniSONE (STERAPRED UNI-PAK 21 TAB) 10 MG (21) TBPK tablet Per package insert 21 tablet 0   No current facility-administered medications on file prior to visit.    Recent Results (from the past 2160 hour(s))  CBC with Differential/Platelet     Status: Abnormal   Collection Time: 04/24/18 11:31 AM  Result Value Ref Range   WBC 7.1 4.0 - 10.5 K/uL   RBC 4.75 3.87  - 5.11 Mil/uL   Hemoglobin 12.7 12.0 - 15.0 g/dL   HCT 38.0 36.0 - 46.0 %   MCV 79.9 78.0 - 100.0 fl   MCHC 33.4 30.0 - 36.0 g/dL   RDW 15.0 11.5 - 15.5 %   Platelets 262.0 150.0 - 400.0 K/uL   Neutrophils Relative % 46.8 43.0 - 77.0 %   Lymphocytes Relative 32.1 12.0 - 46.0 %   Monocytes Relative 14.1 (H) 3.0 - 12.0 %   Eosinophils Relative 6.2 (H) 0.0 - 5.0 %   Basophils Relative 0.8 0.0 - 3.0 %   Neutro Abs 3.3 1.4 - 7.7 K/uL   Lymphs Abs 2.3 0.7 - 4.0 K/uL   Monocytes Absolute 1.0 0.1 - 1.0 K/uL   Eosinophils Absolute 0.4 0.0 - 0.7 K/uL   Basophils Absolute 0.1 0.0 - 0.1 K/uL  TSH     Status: None   Collection Time: 04/24/18 11:31 AM  Result Value Ref Range   TSH 0.84 0.35 - 4.50 uIU/mL  Comprehensive metabolic panel     Status: Abnormal   Collection Time: 04/24/18 11:31 AM  Result Value Ref Range   Sodium 141 135 - 145 mEq/L   Potassium 4.9 3.5 - 5.1 mEq/L   Chloride 102 96 - 112 mEq/L   CO2 30 19 - 32 mEq/L   Glucose, Bld 96 70 - 99 mg/dL   BUN 17 6 - 23 mg/dL   Creatinine, Ser 1.14 0.40 - 1.20 mg/dL   Total Bilirubin 0.4 0.2 - 1.2 mg/dL   Alkaline Phosphatase 82 39 - 117 U/L   AST 24 0 - 37 U/L   ALT 17 0 - 35 U/L   Total Protein 6.4 6.0 - 8.3 g/dL   Albumin 4.3 3.5 - 5.2 g/dL   Calcium 10.6 (H) 8.4 - 10.5 mg/dL   GFR 45.94 (L) >60.00 mL/min  IBC panel     Status: Abnormal   Collection Time: 04/24/18 11:31 AM  Result Value Ref Range   Iron 56 42 - 145 ug/dL   Transferrin 240.0 212.0 - 360.0 mg/dL   Saturation Ratios 16.7 (L) 20.0 - 50.0 %  Ferritin     Status: None   Collection Time: 04/24/18 11:31 AM  Result Value Ref Range   Ferritin 137.8 10.0 - 291.0 ng/mL  Uric acid     Status: None   Collection Time: 05/22/18  9:02 AM  Result Value Ref Range   Uric Acid, Serum 7.1 2.5 - 7.1 mg/dL    Comment: Performed at [facility], [address] [place], [place] [address]  Hepatitis B surface  antigen     Status: None   Collection Time:  05/22/18  9:02 AM  Result Value Ref Range   Hepatitis B Surface Ag Negative Negative    Comment: (NOTE) Performed At: [facility] [place], [place] [id number] Rush Farmer MD [id number]   Hepatitis B surface antibody,qualitative     Status: None   Collection Time: 05/22/18  9:02 AM  Result Value Ref Range   Hep B S Ab Non Reactive     Comment: (NOTE)              Non Reactive: Inconsistent with immunity,                            less than 10 mIU/mL              Reactive:     Consistent with immunity,                            greater than 9.9 mIU/mL Performed At: [facility] [place], [place] [id number] Rush Farmer MD PX:[id number]   Hepatitis B core antibody, total     Status: None   Collection Time: 05/22/18  9:02 AM  Result Value Ref Range   Hep B Core Total Ab Negative Negative    Comment: (NOTE) Performed At: [facility] [address] [place], [place] [id number] Rush Farmer MD FG:[id number]   Hepatitis c antibody (reflex)     Status: None   Collection Time: 05/22/18  9:02 AM  Result Value Ref Range   HCV Ab <0.1 0.0 - 0.9 s/co ratio    Comment: (NOTE) Performed At: [facility] [address] [place], [place] [id number] Rush Farmer MD [id number]   HIV Antibody (routine testing w rflx)     Status: None   Collection Time: 05/22/18  9:02 AM  Result Value Ref Range   HIV Screen 4th Generation wRfx Non Reactive Non Reactive    Comment: (NOTE) Performed At: [facility] [place], [place] [id number] Rush Farmer MD ER:[id number]   Lactate dehydrogenase     Status: Abnormal   Collection Time: 05/22/18  9:02 AM  Result Value Ref Range   LDH 237 (H) 98 - 192 U/L    Comment: Performed at [facility], [place] [address] [place], [place] [address]  CMP ([place] only)     Status: Abnormal   Collection Time: 05/22/18  9:02 AM  Result  Value Ref Range   Sodium 140 135 - 145 mmol/L   Potassium 4.1 3.5 - 5.1 mmol/L   Chloride 100 98 - 111 mmol/L   CO2 28 22 - 32 mmol/L   Glucose, Bld 88 70 - 99 mg/dL   BUN 17 8 - 23 mg/dL   Creatinine 1.26 (H) 0.44 - 1.00 mg/dL   Calcium 10.7 (H) 8.9 - 10.3 mg/dL   Total Protein 6.6 6.5 - 8.1 g/dL   Albumin 4.2 3.5 - 5.0 g/dL   AST 19 15 - 41 U/L   ALT 12 0 - 44 U/L   Alkaline Phosphatase 86 38 - 126 U/L   Total Bilirubin 0.4 0.3 - 1.2 mg/dL   GFR, Est Non Af Am 40 (L) >60 mL/min   GFR, Est AFR Am 47 (L) >60 mL/min   Anion gap 12 5 - 15    Comment: Performed at  [facility], [address] [place], [place] [address]  CBC with Differential ([place] Only)     Status: Abnormal   Collection Time: 05/22/18  9:02 AM  Result Value Ref Range   WBC Count 8.2 4.0 - 10.5 K/uL   RBC 4.56 3.87 - 5.11 MIL/uL   Hemoglobin 12.0 12.0 - 15.0 g/dL   HCT 37.3 36.0 - 46.0 %   MCV 81.8 80.0 - 100.0 fL   MCH 26.3 26.0 - 34.0 pg   MCHC 32.2 30.0 - 36.0 g/dL   RDW 14.6 11.5 - 15.5 %   Platelet Count 249 150 - 400 K/uL   nRBC 0.0 0.0 - 0.2 %   Neutrophils Relative % 60 %   Neutro Abs 4.9 1.7 - 7.7 K/uL   Lymphocytes Relative 20 %   Lymphs Abs 1.6 0.7 - 4.0 K/uL   Monocytes Relative 15 %   Monocytes Absolute 1.2 (H) 0.1 - 1.0 K/uL   Eosinophils Relative 3 %   Eosinophils Absolute 0.3 0.0 - 0.5 K/uL   Basophils Relative 1 %   Basophils Absolute 0.1 0.0 - 0.1 K/uL   Immature Granulocytes 1 %   Abs Immature Granulocytes 0.11 (H) 0.00 - 0.07 K/uL    Comment: Performed at [facility], [address] [place], [place] [address]  HCV Comment:     Status: None   Collection Time: 05/22/18  9:02 AM  Result Value Ref Range   Comment: Comment     Comment: (NOTE) Non reactive HCV antibody screen is consistent with no HCV infection, unless recent infection is suspected or other evidence exists to indicate HCV  infection. Performed At: [facility] [place], [place] [id number] Rush Farmer MD [id number]   Glucose, capillary     Status: Abnormal   Collection Time: 05/29/18  7:51 AM  Result Value Ref Range   Glucose-Capillary 114 (H) 70 - 99 mg/dL  CBC with Differential/Platelet     Status: Abnormal   Collection Time: 06/05/18  5:00 PM  Result Value Ref Range   WBC 7.0 3.8 - 10.8 Thousand/uL   RBC 4.52 3.80 - 5.10 Million/uL   Hemoglobin 12.3 11.7 - 15.5 g/dL   HCT 36.4 35.0 - 45.0 %   MCV 80.5 80.0 - 100.0 fL   MCH 27.2 27.0 - 33.0 pg   MCHC 33.8 32.0 - 36.0 g/dL   RDW 14.1 11.0 - 15.0 %   Platelets 312 140 - 400 Thousand/uL   MPV 10.2 7.5 - 12.5 fL   Neutro Abs 4,277 1,500 - 7,800 cells/uL   Lymphs Abs 1,393 850 - 3,900 cells/uL   Absolute Monocytes 1,057 (H) 200 - 950 cells/uL   Eosinophils Absolute 182 15 - 500 cells/uL   Basophils Absolute 91 0 - 200 cells/uL   Neutrophils Relative % 61.1 %   Total Lymphocyte 19.9 %   Monocytes Relative 15.1 %   Eosinophils Relative 2.6 %   Basophils Relative 1.3 %  Brain natriuretic peptide     Status: None   Collection Time: 06/05/18  5:00 PM  Result Value Ref Range   Brain Natriuretic Peptide 72 <100 pg/mL    Comment: . BNP levels increase with age in the general population with the highest values seen in individuals greater than 42 years of age. Reference: J. Am. [place] Ar. Cardiol. 2002; 53:299-242. .   IgE     Status:  None   Collection Time: 06/05/18  5:00 PM  Result Value Ref Range   IgE (Immunoglobulin E), Serum <2 <OR=114 kU/L  IgG, IgA, IgM     Status: Abnormal   Collection Time: 06/05/18  5:00 PM  Result Value Ref Range   Immunoglobulin A 57 (L) 70 - 320 mg/dL   IgG (Immunoglobin G), Serum 521 (L) 600 - 1,540 mg/dL   IgM, Serum 61 50 - 300 mg/dL  CBG monitoring, ED     Status: Abnormal   Collection Time: 06/16/18 12:48 PM  Result Value Ref Range   Glucose-Capillary 107 (H) 70 - 99 mg/dL   Comprehensive metabolic panel     Status: Abnormal   Collection Time: 06/16/18  1:13 PM  Result Value Ref Range   Sodium 137 135 - 145 mmol/L   Potassium 3.4 (L) 3.5 - 5.1 mmol/L   Chloride 100 98 - 111 mmol/L   CO2 27 22 - 32 mmol/L   Glucose, Bld 119 (H) 70 - 99 mg/dL   BUN 27 (H) 8 - 23 mg/dL   Creatinine, Ser 1.85 (H) 0.44 - 1.00 mg/dL   Calcium 13.0 (H) 8.9 - 10.3 mg/dL   Total Protein 6.9 6.5 - 8.1 g/dL   Albumin 4.0 3.5 - 5.0 g/dL   AST 24 15 - 41 U/L   ALT 14 0 - 44 U/L   Alkaline Phosphatase 61 38 - 126 U/L   Total Bilirubin 0.4 0.3 - 1.2 mg/dL   GFR calc non Af Amer 25 (L) >60 mL/min   GFR calc Af Amer 29 (L) >60 mL/min   Anion gap 10 5 - 15    Comment: Performed at [facility], [address] [place], [place] [address]  Troponin I - Once     Status: None   Collection Time: 06/16/18  1:13 PM  Result Value Ref Range   Troponin I <0.03 <0.03 ng/mL    Comment: Performed at [facility], [address] [place], [place] [address]  CBC with Differential     Status: Abnormal   Collection Time: 06/16/18  1:13 PM  Result Value Ref Range   WBC 8.2 4.0 - 10.5 K/uL   RBC 4.85 3.87 - 5.11 MIL/uL   Hemoglobin 12.6 12.0 - 15.0 g/dL   HCT 39.6 36.0 - 46.0 %   MCV 81.6 80.0 - 100.0 fL   MCH 26.0 26.0 - 34.0 pg   MCHC 31.8 30.0 - 36.0 g/dL   RDW 14.5 11.5 - 15.5 %   Platelets 245 150 - 400 K/uL   nRBC 0.0 0.0 - 0.2 %   Neutrophils Relative % 66 %   Neutro Abs 5.4 1.7 - 7.7 K/uL   Lymphocytes Relative 15 %   Lymphs Abs 1.2 0.7 - 4.0 K/uL   Monocytes Relative 11 %   Monocytes Absolute 0.9 0.1 - 1.0 K/uL   Eosinophils Relative 5 %   Eosinophils Absolute 0.4 0.0 - 0.5 K/uL   Basophils Relative 1 %   Basophils Absolute 0.1 0.0 - 0.1 K/uL   Immature Granulocytes 2 %   Abs Immature Granulocytes 0.17 (H) 0.00 - 0.07 K/uL    Comment: Performed at [facility], [address] [place], [place] [address]  Urinalysis, Routine w reflex microscopic      Status: Abnormal   Collection Time: 06/16/18  2:04 PM  Result Value Ref Range   Color, Urine YELLOW YELLOW   APPearance CLEAR  CLEAR   Specific Gravity, Urine 1.010 1.005 - 1.030   pH 7.0 5.0 - 8.0   Glucose, UA NEGATIVE NEGATIVE mg/dL   Hgb urine dipstick NEGATIVE NEGATIVE   Bilirubin Urine NEGATIVE NEGATIVE   Ketones, ur NEGATIVE NEGATIVE mg/dL   Protein, ur NEGATIVE NEGATIVE mg/dL   Nitrite NEGATIVE NEGATIVE   Leukocytes,Ua SMALL (A) NEGATIVE    Comment: Performed at [facility], [address] [place], [place] [address]  Urinalysis, Microscopic (reflex)     Status: Abnormal   Collection Time: 06/16/18  2:04 PM  Result Value Ref Range   RBC / HPF 0-5 0 - 5 RBC/hpf   WBC, UA 6-10 0 - 5 WBC/hpf   Bacteria, UA FEW (A) NONE SEEN   Squamous Epithelial / LPF 0-5 0 - 5   Mucus PRESENT    Granular Casts, UA PRESENT    Ca Oxalate Crys, UA PRESENT     Comment: Performed at [facility], [address] [place], [place] [address]  TSH     Status: None   Collection Time: 06/16/18  4:39 PM  Result Value Ref Range   TSH 0.582 0.350 - 4.500 uIU/mL    Comment: Performed by a 3rd Generation assay with a functional sensitivity of <=0.01 uIU/mL. Performed at [facility], [place] [address], [place], [place] [address]   PTH, intact and calcium     Status: Abnormal   Collection Time: 06/16/18  4:39 PM  Result Value Ref Range   PTH 9 (L) 15 - 65 pg/mL   Calcium, Total (PTH) 12.3 (H) 8.7 - 10.3 mg/dL   PTH Interp Comment     Comment: (NOTE) Interpretation                 Intact PTH    Calcium                                (pg/mL)      (mg/dL) Normal                          15 - 65     8.6 - 10.2 Primary Hyperparathyroidism         >65          >10.2 Secondary Hyperparathyroidism       >65          <10.2 Non-Parathyroid Hypercalcemia       <65          >10.2 Hypoparathyroidism                  <15          < 8.6 Non-Parathyroid Hypocalcemia    15  - 65          < 8.6 Performed At: [facility] [place], [place] [id number] Rush Farmer MD ML:[id number]   ABO/Rh     Status: None   Collection Time: 06/16/18  5:19 PM  Result Value Ref Range   ABO/RH(D)      O POS Performed at [facility], [place] [address], [place], [place] [address]   SARS Coronavirus 2 [facility] order, Performed in [facility] hospital lab)     Status: None   Collection Time: 06/16/18  5:27 PM  Result Value Ref Range   SARS Coronavirus 2 NEGATIVE NEGATIVE  Comment: (NOTE) If result is NEGATIVE SARS-CoV-2 target nucleic acids are NOT DETECTED. The SARS-CoV-2 RNA is generally detectable in upper and lower  respiratory specimens during the acute phase of infection. The lowest  concentration of SARS-CoV-2 viral copies this assay can detect is 250  copies / mL. A negative result does not preclude SARS-CoV-2 infection  and should not be used as the sole basis for treatment or other  patient management decisions.  A negative result may occur with  improper specimen collection / handling, submission of specimen other  than nasopharyngeal swab, presence of viral mutation(s) within the  areas targeted by this assay, and inadequate number of viral copies  (<250 copies / mL). A negative result must be combined with clinical  observations, patient history, and epidemiological information. If result is POSITIVE SARS-CoV-2 target nucleic acids are DETECTED. The SARS-CoV-2 RNA is generally detectable in upper and lower  respiratory specimens dur ing the acute phase of infection.  Positive  results are indicative of active infection with SARS-CoV-2.  Clinical  correlation with patient history and other diagnostic information is  necessary to determine patient infection status.  Positive results do  not rule out bacterial infection or co-infection with other viruses. If result is PRESUMPTIVE POSTIVE SARS-CoV-2 nucleic acids MAY BE  PRESENT.   A presumptive positive result was obtained on the submitted specimen  and confirmed on repeat testing.  While 2019 novel coronavirus  (SARS-CoV-2) nucleic acids may be present in the submitted sample  additional confirmatory testing may be necessary for epidemiological  and / or clinical management purposes  to differentiate between  SARS-CoV-2 and other Sarbecovirus currently known to infect humans.  If clinically indicated additional testing with an alternate test  methodology [phone]) is advised. The SARS-CoV-2 RNA is generally  detectable in upper and lower respiratory sp ecimens during the acute  phase of infection. The expected result is Negative. Fact Sheet for Patients:  StrictlyIdeas.no Fact Sheet for Healthcare Providers: BankingDealers.co.za This test is not yet approved or cleared by the [place] FDA and has been authorized for detection and/or diagnosis of SARS-CoV-2 by FDA under an Emergency Use Authorization (EUA).  This EUA will remain in effect (meaning this test can be used) for the duration of the COVID-19 declaration under Section 564(b)(1) of the Act, 21 U.S.C. section 360bbb-3(b)(1), unless the authorization is terminated or revoked sooner. Performed at [facility], [place] [address] [place], [place] [address]   Culture, Urine     Status: Abnormal   Collection Time: 06/16/18 10:55 PM  Result Value Ref Range   Specimen Description      URINE, CLEAN CATCH Performed at [facility], [place] [address], [place], [place] [address]    Special Requests      NONE Performed at [facility], [place] [address], [place], [place] [address]    Culture MULTIPLE SPECIES PRESENT, SUGGEST RECOLLECTION (A)    Report Status 06/18/2018 FINAL   CBC     Status: Abnormal   Collection Time: 06/17/18  5:25 AM  Result Value Ref Range   WBC 6.4 4.0 - 10.5 K/uL   RBC 3.94 3.87 - 5.11  MIL/uL   Hemoglobin 10.5 (L) 12.0 - 15.0 g/dL   HCT 32.8 (L) 36.0 - 46.0 %   MCV 83.2 80.0 - 100.0 fL   MCH 26.6 26.0 - 34.0 pg   MCHC 32.0 30.0 - 36.0 g/dL   RDW 14.6 11.5 - 15.5 %  Platelets 200 150 - 400 K/uL   nRBC 0.0 0.0 - 0.2 %    Comment: Performed at [facility], [place] [address], [place], [place] [address]  Basic metabolic panel     Status: Abnormal   Collection Time: 06/17/18  5:25 AM  Result Value Ref Range   Sodium 140 135 - 145 mmol/L   Potassium 3.6 3.5 - 5.1 mmol/L   Chloride 109 98 - 111 mmol/L   CO2 24 22 - 32 mmol/L   Glucose, Bld 88 70 - 99 mg/dL   BUN 21 8 - 23 mg/dL   Creatinine, Ser 1.62 (H) 0.44 - 1.00 mg/dL   Calcium 11.0 (H) 8.9 - 10.3 mg/dL   GFR calc non Af Amer 30 (L) >60 mL/min   GFR calc Af Amer 35 (L) >60 mL/min   Anion gap 7 5 - 15    Comment: Performed at [place] [address], [place], [place] [address]  Calcitriol (1,25 di-OH Vit D)     Status: Abnormal   Collection Time: 06/17/18  3:20 PM  Result Value Ref Range   Vit D, 1,25-Dihydroxy 136.0 (H) 19.9 - 79.3 pg/mL    Comment: (NOTE) Performed At: [facility] [place], [place] [id number] Rush Farmer MD [id number]   PTH-related peptide     Status: None   Collection Time: 06/17/18  3:20 PM  Result Value Ref Range   PTH-related peptide <2.0 pmol/L    Comment: (NOTE) Reference Range: All Ages: <2.0 The PTHrP assay should not be used to exclude cancer or screen tumor patients for humoral hypercalcemia of malignancy (HHM). The results should always be assessed in conjunction with the patient's medical history, clinical examination, and other findings. If test results are clinically discordant, please contact the laboratory. Performed At: [facility], [place] [id number] [place] Sheral Apley MD XB:[id number]   Basic metabolic panel     Status: Abnormal   Collection Time: 06/17/18   5:06 PM  Result Value Ref Range   Sodium 140 135 - 145 mmol/L   Potassium 3.4 (L) 3.5 - 5.1 mmol/L   Chloride 108 98 - 111 mmol/L   CO2 24 22 - 32 mmol/L   Glucose, Bld 96 70 - 99 mg/dL   BUN 20 8 - 23 mg/dL   Creatinine, Ser 1.57 (H) 0.44 - 1.00 mg/dL   Calcium 11.3 (H) 8.9 - 10.3 mg/dL   GFR calc non Af Amer 31 (L) >60 mL/min   GFR calc Af Amer 36 (L) >60 mL/min   Anion gap 8 5 - 15    Comment: Performed at [facility], [place] [address] [place], [place] [address]  Magnesium     Status: Abnormal   Collection Time: 06/17/18  5:06 PM  Result Value Ref Range   Magnesium 1.6 (L) 1.7 - 2.4 mg/dL    Comment: Performed at [facility], [place] [address], [place], [place] [address]  Renal function panel     Status: Abnormal   Collection Time: 06/18/18  4:44 AM  Result Value Ref Range   Sodium 140 135 - 145 mmol/L   Potassium 3.5 3.5 - 5.1 mmol/L   Chloride 110 98 - 111 mmol/L   CO2 23 22 - 32 mmol/L   Glucose, Bld 86 70 - 99 mg/dL   BUN 16 8 - 23 mg/dL   Creatinine, Ser 1.51 (H) 0.44 - 1.00 mg/dL  Calcium 10.4 (H) 8.9 - 10.3 mg/dL   Phosphorus 2.0 (L) 2.5 - 4.6 mg/dL   Albumin 3.0 (L) 3.5 - 5.0 g/dL   GFR calc non Af Amer 33 (L) >60 mL/min   GFR calc Af Amer 38 (L) >60 mL/min   Anion gap 7 5 - 15    Comment: Performed at [facility] [address] [facility], [place] [address] [place], [place] [address]  VITAMIN D 25 Hydroxy (Vit-D Deficiency, Fractures)     Status: Abnormal   Collection Time: 06/18/18  4:44 AM  Result Value Ref Range   Vit D, 25-Hydroxy 20.0 (L) 30.0 - 100.0 ng/mL    Comment: (NOTE) Vitamin D deficiency has been defined by the [place] practice guideline as a level of serum 25-OH vitamin D less than 20 ng/mL (1,2). The Endocrine Society went on to further define vitamin D insufficiency as a level between 21 and 29 ng/mL (2). 1. IOM (Institute of Medicine). 2010. Dietary reference   intakes for  calcium and D. [place]: The   [employer]. 2. Holick MF, B[place], Bischoff-Ferrari HA, et al.   Evaluation, treatment, and prevention of vitamin D   deficiency: an Endocrine Society clinical practice   guideline. JCEM. 2011 Jul; 96(7):1911-30. Performed At: [facility] [place], [place] [id number] Rush Farmer MD [id number]   Basic metabolic panel     Status: Abnormal   Collection Time: 06/18/18  2:58 PM  Result Value Ref Range   Sodium 139 135 - 145 mmol/L   Potassium 2.9 (L) 3.5 - 5.1 mmol/L    Comment: DELTA CHECK NOTED REPEATED TO VERIFY    Chloride 108 98 - 111 mmol/L   CO2 22 22 - 32 mmol/L   Glucose, Bld 100 (H) 70 - 99 mg/dL   BUN 14 8 - 23 mg/dL   Creatinine, Ser 1.50 (H) 0.44 - 1.00 mg/dL   Calcium 10.4 (H) 8.9 - 10.3 mg/dL   GFR calc non Af Amer 33 (L) >60 mL/min   GFR calc Af Amer 38 (L) >60 mL/min   Anion gap 9 5 - 15    Comment: Performed at [facility], [place] [address] [place], [place] [address]  CBC     Status: Abnormal   Collection Time: 06/18/18  2:58 PM  Result Value Ref Range   WBC 7.0 4.0 - 10.5 K/uL   RBC 4.06 3.87 - 5.11 MIL/uL   Hemoglobin 10.9 (L) 12.0 - 15.0 g/dL   HCT 33.7 (L) 36.0 - 46.0 %   MCV 83.0 80.0 - 100.0 fL   MCH 26.8 26.0 - 34.0 pg   MCHC 32.3 30.0 - 36.0 g/dL   RDW 14.7 11.5 - 15.5 %   Platelets 191 150 - 400 K/uL   nRBC 0.0 0.0 - 0.2 %    Comment: Performed at [facility], [place] [address], [place], [place] [address]  Magnesium     Status: Abnormal   Collection Time: 06/18/18  2:58 PM  Result Value Ref Range   Magnesium 1.6 (L) 1.7 - 2.4 mg/dL    Comment: Performed at [facility], [place] [address], [place],  [address]  Phosphorus     Status: Abnormal   Collection Time: 06/18/18  2:58 PM  Result Value Ref Range   Phosphorus 1.7 (L) 2.5 - 4.6 mg/dL    Comment: Performed at [facility], [place]  [address] [place],  [place] [address]  Calcium, urine, random     Status: None   Collection Time: 06/18/18 10:09 PM  Result Value Ref Range   Calcium, Ur 10.6 Not Estab. mg/dL    Comment: (NOTE) Performed At: [facility] [place], [place] [id number] Rush Farmer MD DE:[id number]   Renal function panel     Status: Abnormal   Collection Time: 06/19/18  4:50 AM  Result Value Ref Range   Sodium 140 135 - 145 mmol/L   Potassium 2.7 (LL) 3.5 - 5.1 mmol/L    Comment: CRITICAL RESULT CALLED TO, READ BACK BY AND VERIFIED WITH: A FRAZIER,RN 06/19/18 0522 RHOLMES    Chloride 108 98 - 111 mmol/L   CO2 23 22 - 32 mmol/L   Glucose, Bld 94 70 - 99 mg/dL   BUN 12 8 - 23 mg/dL   Creatinine, Ser 1.18 (H) 0.44 - 1.00 mg/dL   Calcium 10.0 8.9 - 10.3 mg/dL   Phosphorus 2.0 (L) 2.5 - 4.6 mg/dL   Albumin 3.1 (L) 3.5 - 5.0 g/dL   GFR calc non Af Amer 44 (L) >60 mL/min   GFR calc Af Amer 51 (L) >60 mL/min   Anion gap 9 5 - 15    Comment: Performed at [facility], [place] [address] [place], [place] [address]  Renal function panel     Status: Abnormal   Collection Time: 06/20/18  3:54 AM  Result Value Ref Range   Sodium 140 135 - 145 mmol/L   Potassium 3.5 3.5 - 5.1 mmol/L    Comment: DELTA CHECK NOTED REPEATED TO VERIFY NO VISIBLE HEMOLYSIS    Chloride 107 98 - 111 mmol/L   CO2 24 22 - 32 mmol/L   Glucose, Bld 88 70 - 99 mg/dL   BUN 10 8 - 23 mg/dL   Creatinine, Ser 1.31 (H) 0.44 - 1.00 mg/dL   Calcium 10.2 8.9 - 10.3 mg/dL   Phosphorus 3.7 2.5 - 4.6 mg/dL   Albumin 3.2 (L) 3.5 - 5.0 g/dL   GFR calc non Af Amer 39 (L) >60 mL/min   GFR calc Af Amer 45 (L) >60 mL/min   Anion gap 9 5 - 15    Comment: Performed at [facility], [place] [address], [place], [place] [address]  Magnesium     Status: Abnormal   Collection Time: 06/20/18  3:54 AM  Result Value Ref Range   Magnesium 1.6 (L) 1.7 - 2.4 mg/dL    Comment: Performed at [facility], [place] [address] [place], [place] [address]  CBC and differential     Status: Abnormal   Collection Time: 06/23/18 12:00 AM  Result Value Ref Range   Hemoglobin 11.4 (A) 12.0 - 16.0   HCT 35 (A) 36 - 46   Neutrophils Absolute 5    Platelets 219 150 - 399   WBC 7.0   Basic metabolic panel     Status: Abnormal   Collection Time: 06/23/18 12:00 AM  Result Value Ref Range   Glucose 91    BUN 14 4 - 21   Creatinine 1.2 (A) 0.5 - 1.1   Potassium 3.7 3.4 - 5.3   Sodium 129 (A) 137 - 147  Comprehensive metabolic panel     Status: Abnormal   Collection Time: 06/26/18 11:47 AM  Result Value Ref Range   Sodium 132 (L) 135 - 145 mmol/L   Potassium 3.4 (L) 3.5 - 5.1 mmol/L   Chloride 96 (  L) 98 - 111 mmol/L   CO2 24 22 - 32 mmol/L   Glucose, Bld 99 70 - 99 mg/dL   BUN 15 8 - 23 mg/dL   Creatinine, Ser 1.21 (H) 0.44 - 1.00 mg/dL   Calcium 11.0 (H) 8.9 - 10.3 mg/dL   Total Protein 6.6 6.5 - 8.1 g/dL   Albumin 3.6 3.5 - 5.0 g/dL   AST 28 15 - 41 U/L   ALT 15 0 - 44 U/L   Alkaline Phosphatase 72 38 - 126 U/L   Total Bilirubin 0.8 0.3 - 1.2 mg/dL   GFR calc non Af Amer 43 (L) >60 mL/min   GFR calc Af Amer 49 (L) >60 mL/min   Anion gap 12 5 - 15    Comment: Performed at [facility], [address] [place], [place] [address]  CBC with Differential     Status: Abnormal   Collection Time: 06/26/18 11:47 AM  Result Value Ref Range   WBC 8.1 4.0 - 10.5 K/uL   RBC 4.46 3.87 - 5.11 MIL/uL   Hemoglobin 11.6 (L) 12.0 - 15.0 g/dL   HCT 36.1 36.0 - 46.0 %   MCV 80.9 80.0 - 100.0 fL   MCH 26.0 26.0 - 34.0 pg   MCHC 32.1 30.0 - 36.0 g/dL   RDW 15.5 11.5 - 15.5 %   Platelets 247 150 - 400 K/uL   nRBC 0.0 0.0 - 0.2 %   Neutrophils Relative % 61 %   Neutro Abs 5.0 1.7 - 7.7 K/uL   Lymphocytes Relative 18 %   Lymphs Abs 1.4 0.7 - 4.0 K/uL   Monocytes Relative 14 %   Monocytes Absolute 1.1 (H) 0.1 - 1.0 K/uL   Eosinophils Relative 4 %   Eosinophils Absolute 0.3  0.0 - 0.5 K/uL   Basophils Relative 1 %   Basophils Absolute 0.1 0.0 - 0.1 K/uL   Immature Granulocytes 2 %   Abs Immature Granulocytes 0.12 (H) 0.00 - 0.07 K/uL    Comment: Performed at [facility], [address] [place], [place] [address]  Brain natriuretic peptide     Status: Abnormal   Collection Time: 06/26/18 11:47 AM  Result Value Ref Range   B Natriuretic Peptide 171.6 (H) 0.0 - 100.0 pg/mL    Comment: Performed at [facility], [place] [place], [place] [address]  D-dimer, quantitative     Status: Abnormal   Collection Time: 06/26/18 11:47 AM  Result Value Ref Range   D-Dimer, Quant 2.29 (H) 0.00 - 0.50 ug/mL-FEU    Comment: (NOTE) At the manufacturer cut-off of 0.50 ug/mL FEU, this assay has been documented to exclude PE with a sensitivity and negative predictive value of 97 to 99%.  At this time, this assay has not been approved by the FDA to exclude DVT/VTE. Results should be correlated with clinical presentation. Performed at [facility], [address] [place], [place] [address]   Troponin I - Once     Status: Abnormal   Collection Time: 06/26/18 11:47 AM  Result Value Ref Range   Troponin I 0.06 (HH) <0.03 ng/mL    Comment: CRITICAL RESULT CALLED TO, READ BACK BY AND VERIFIED WITH: SAM COBLE RN @1246  06/26/2018 OLSONM Performed at [facility], [place] [place], [place] [address]   SARS Coronavirus 2 [facility] order, Performed in [facility] hospital lab)     Status: None  Collection Time: 06/26/18 12:06 PM  Result Value Ref Range   SARS Coronavirus 2 NEGATIVE NEGATIVE    Comment: (NOTE) If result is NEGATIVE SARS-CoV-2 target nucleic acids are NOT DETECTED. The SARS-CoV-2 RNA is generally detectable in upper and lower  respiratory specimens during the acute phase of infection. The lowest  concentration of SARS-CoV-2 viral copies this assay can detect is 250  copies / mL. A negative result does  not preclude SARS-CoV-2 infection  and should not be used as the sole basis for treatment or other  patient management decisions.  A negative result may occur with  improper specimen collection / handling, submission of specimen other  than nasopharyngeal swab, presence of viral mutation(s) within the  areas targeted by this assay, and inadequate number of viral copies  (<250 copies / mL). A negative result must be combined with clinical  observations, patient history, and epidemiological information. If result is POSITIVE SARS-CoV-2 target nucleic acids are DETECTED. The SARS-CoV-2 RNA is generally detectable in upper and lower  respiratory specimens dur ing the acute phase of infection.  Positive  results are indicative of active infection with SARS-CoV-2.  Clinical  correlation with patient history and other diagnostic information is  necessary to determine patient infection status.  Positive results do  not rule out bacterial infection or co-infection with other viruses. If result is PRESUMPTIVE POSTIVE SARS-CoV-2 nucleic acids MAY BE PRESENT.   A presumptive positive result was obtained on the submitted specimen  and confirmed on repeat testing.  While 2019 novel coronavirus  (SARS-CoV-2) nucleic acids may be present in the submitted sample  additional confirmatory testing may be necessary for epidemiological  and / or clinical management purposes  to differentiate between  SARS-CoV-2 and other Sarbecovirus currently known to infect humans.  If clinically indicated additional testing with an alternate test  methodology [phone]) is advised. The SARS-CoV-2 RNA is generally  detectable in upper and lower respiratory sp ecimens during the acute  phase of infection. The expected result is Negative. Fact Sheet for Patients:  StrictlyIdeas.no Fact Sheet for Healthcare Providers: BankingDealers.co.za This test is not yet approved or  cleared by the [place] FDA and has been authorized for detection and/or diagnosis of SARS-CoV-2 by FDA under an Emergency Use Authorization (EUA).  This EUA will remain in effect (meaning this test can be used) for the duration of the COVID-19 declaration under Section 564(b)(1) of the Act, 21 U.S.C. section 360bbb-3(b)(1), unless the authorization is terminated or revoked sooner. Performed at [facility], [place] [address], [place], [place] [address]   Urinalysis, Routine w reflex microscopic     Status: Abnormal   Collection Time: 06/26/18  1:01 PM  Result Value Ref Range   Color, Urine YELLOW YELLOW   APPearance CLEAR CLEAR   Specific Gravity, Urine 1.015 1.005 - 1.030   pH 6.0 5.0 - 8.0   Glucose, UA NEGATIVE NEGATIVE mg/dL   Hgb urine dipstick NEGATIVE NEGATIVE   Bilirubin Urine NEGATIVE NEGATIVE   Ketones, ur NEGATIVE NEGATIVE mg/dL   Protein, ur NEGATIVE NEGATIVE mg/dL   Nitrite NEGATIVE NEGATIVE   Leukocytes,Ua TRACE (A) NEGATIVE    Comment: Performed at [facility], [place] [place], [place] [address]  Urinalysis, Microscopic (reflex)     Status: Abnormal   Collection Time: 06/26/18  1:01 PM  Result Value Ref Range   RBC / HPF NONE SEEN 0 - 5 RBC/hpf   WBC, UA 0-5 0 - 5  WBC/hpf   Bacteria, UA FEW (A) NONE SEEN   Squamous Epithelial / LPF 6-10 0 - 5    Comment: Performed at [facility], [place] [address] [place], [place] [address]  Troponin I - Once     Status: Abnormal   Collection Time: 06/26/18  3:34 PM  Result Value Ref Range   Troponin I 0.10 (HH) <0.03 ng/mL    Comment: CRITICAL RESULT CALLED TO, READ BACK BY AND VERIFIED WITHMacky Lower RN [phone] [name] C Performed at [facility], [place] [place], [place] [address]   Culture, blood (routine x 2) Call MD if unable to obtain prior to antibiotics being given     Status: None   Collection Time: 06/26/18 10:34 PM  Result Value Ref Range   Specimen  Description BLOOD LEFT ARM    Special Requests      BOTTLES DRAWN AEROBIC ONLY Blood Culture adequate volume   Culture      NO GROWTH 5 DAYS Performed at [facility], [place] [address] [place], [place] [address]    Report Status 07/01/2018 FINAL   HIV antibody (Routine Screening)     Status: None   Collection Time: 06/26/18 10:34 PM  Result Value Ref Range   HIV Screen 4th Generation wRfx Non Reactive Non Reactive    Comment: (NOTE) Performed At: [facility] [place], [place] [id number] Rush Farmer MD [id number]   CBC     Status: Abnormal   Collection Time: 06/26/18 10:34 PM  Result Value Ref Range   WBC 7.4 4.0 - 10.5 K/uL   RBC 4.17 3.87 - 5.11 MIL/uL   Hemoglobin 11.0 (L) 12.0 - 15.0 g/dL   HCT 32.7 (L) 36.0 - 46.0 %   MCV 78.4 (L) 80.0 - 100.0 fL   MCH 26.4 26.0 - 34.0 pg   MCHC 33.6 30.0 - 36.0 g/dL   RDW 15.4 11.5 - 15.5 %   Platelets 237 150 - 400 K/uL   nRBC 0.0 0.0 - 0.2 %    Comment: Performed at [facility], [place] [address] [place], [place] [address]  Creatinine, serum     Status: Abnormal   Collection Time: 06/26/18 10:34 PM  Result Value Ref Range   Creatinine, Ser 1.27 (H) 0.44 - 1.00 mg/dL   GFR calc non Af Amer 40 (L) >60 mL/min   GFR calc Af Amer 46 (L) >60 mL/min    Comment: Performed at [place] [address] [place], [place] [address]  Culture, blood (routine x 2) Call MD if unable to obtain prior to antibiotics being given     Status: None   Collection Time: 06/26/18 10:38 PM  Result Value Ref Range   Specimen Description BLOOD LEFT HAND    Special Requests      BOTTLES DRAWN AEROBIC AND ANAEROBIC Blood Culture adequate volume   Culture      NO GROWTH 5 DAYS Performed at [place] [address] [place], [place] [address]    Report Status 07/01/2018 FINAL   Comprehensive metabolic panel     Status: Abnormal   Collection Time: 06/27/18  4:50 AM  Result Value Ref Range   Sodium 136  135 - 145 mmol/L   Potassium 3.9 3.5 - 5.1 mmol/L   Chloride 101 98 - 111 mmol/L   CO2 23 22 - 32 mmol/L   Glucose, Bld 87 70 -  99 mg/dL   BUN 13 8 - 23 mg/dL   Creatinine, Ser 1.32 (H) 0.44 - 1.00 mg/dL   Calcium 10.2 8.9 - 10.3 mg/dL   Total Protein 5.0 (L) 6.5 - 8.1 g/dL   Albumin 2.8 (L) 3.5 - 5.0 g/dL   AST 22 15 - 41 U/L   ALT 13 0 - 44 U/L   Alkaline Phosphatase 61 38 - 126 U/L   Total Bilirubin 0.7 0.3 - 1.2 mg/dL   GFR calc non Af Amer 38 (L) >60 mL/min   GFR calc Af Amer 44 (L) >60 mL/min   Anion gap 12 5 - 15    Comment: Performed at [place] [address], [place], [place] [address]  Troponin I - Now Then Q3H     Status: Abnormal   Collection Time: 06/27/18  8:03 AM  Result Value Ref Range   Troponin I 0.08 (HH) <0.03 ng/mL    Comment: CRITICAL RESULT CALLED TO, READ BACK BY AND VERIFIED WITH: Carlos American RN AT 6222 06/27/2018 BY Karie Chimera Performed at [facility], [place] [address], [place], [place] [address]   Troponin I - Now Then Q3H     Status: Abnormal   Collection Time: 06/27/18 10:27 AM  Result Value Ref Range   Troponin I 0.07 (HH) <0.03 ng/mL    Comment: CRITICAL VALUE NOTED.  VALUE IS CONSISTENT WITH PREVIOUSLY REPORTED AND CALLED VALUE. Performed at [facility], [place] [address] [place], [place] [address]   Troponin I - Now Then Q3H     Status: Abnormal   Collection Time: 06/27/18  1:44 PM  Result Value Ref Range   Troponin I 0.07 (HH) <0.03 ng/mL    Comment: CRITICAL VALUE NOTED.  VALUE IS CONSISTENT WITH PREVIOUSLY REPORTED AND CALLED VALUE. Performed at [facility], [place] [address], [place], [place] [address]   ECHOCARDIOGRAM LIMITED     Status: None   Collection Time: 06/27/18  4:24 PM  Result Value Ref Range   Weight 2,264 oz   Height 61 in   BP 150/64 mmHg  Comprehensive metabolic panel     Status: Abnormal   Collection Time: 06/27/18  6:56 PM  Result Value Ref Range   Sodium 137 135 - 145 mmol/L   Potassium 3.7  3.5 - 5.1 mmol/L   Chloride 105 98 - 111 mmol/L   CO2 20 (L) 22 - 32 mmol/L   Glucose, Bld 110 (H) 70 - 99 mg/dL   BUN 12 8 - 23 mg/dL   Creatinine, Ser 1.12 (H) 0.44 - 1.00 mg/dL   Calcium 10.2 8.9 - 10.3 mg/dL   Total Protein 5.4 (L) 6.5 - 8.1 g/dL   Albumin 2.9 (L) 3.5 - 5.0 g/dL   AST 28 15 - 41 U/L   ALT 17 0 - 44 U/L   Alkaline Phosphatase 65 38 - 126 U/L   Total Bilirubin 0.7 0.3 - 1.2 mg/dL   GFR calc non Af Amer 47 (L) >60 mL/min   GFR calc Af Amer 54 (L) >60 mL/min   Anion gap 12 5 - 15    Comment: Performed at [place] [address] [place], [place] [address]  Troponin I - ONCE - STAT     Status: Abnormal   Collection Time: 06/27/18  6:56 PM  Result Value Ref Range   Troponin I 0.07 (HH) <0.03 ng/mL    Comment: CRITICAL VALUE NOTED.  VALUE IS CONSISTENT  WITH PREVIOUSLY REPORTED AND CALLED VALUE. Performed at [facility], [place] [address] [place], [place] [address]   Blood gas, arterial     Status: Abnormal   Collection Time: 06/27/18  8:50 PM  Result Value Ref Range   O2 Content 3.0 L/min   Delivery systems NASAL CANNULA    pH, Arterial 7.458 (H) 7.350 - 7.450   pCO2 arterial 34.5 32.0 - 48.0 mmHg   pO2, Arterial 77.1 (L) 83.0 - 108.0 mmHg   Bicarbonate 24.1 20.0 - 28.0 mmol/L   Acid-Base Excess 0.6 0.0 - 2.0 mmol/L   O2 Saturation 95.9 %   Patient temperature 98.6    Collection site RIGHT RADIAL    Drawn by [phone]    Sample type ARTERIAL DRAW    Allens test (pass/fail) PASS PASS  Strep pneumoniae urinary antigen     Status: None   Collection Time: 06/27/18  9:33 PM  Result Value Ref Range   Strep Pneumo Urinary Antigen NEGATIVE NEGATIVE    Comment: Performed at [facility], [address] [place], [place] [address]  Legionella Pneumophila Serogp 1 Ur Ag     Status: None   Collection Time: 06/27/18 10:08 PM  Result Value Ref Range   L. pneumophila Serogp 1 Ur Ag Negative Negative    Comment: (NOTE) Presumptive negative for L.  pneumophila serogroup 1 antigen in urine, suggesting no recent or current infection. Legionnaires' disease cannot be ruled out since other serogroups and species may also cause disease. Performed At: [facility] [place], [place] [id number] Rush Farmer MD VO:[id number]    Source of Sample URINE, RANDOM     Comment: Performed at [facility], [place] [address] [place], [place] [address]  Basic metabolic panel     Status: Abnormal   Collection Time: 06/28/18  3:13 AM  Result Value Ref Range   Sodium 137 135 - 145 mmol/L   Potassium 3.8 3.5 - 5.1 mmol/L   Chloride 105 98 - 111 mmol/L   CO2 20 (L) 22 - 32 mmol/L   Glucose, Bld 148 (H) 70 - 99 mg/dL   BUN 12 8 - 23 mg/dL   Creatinine, Ser 1.10 (H) 0.44 - 1.00 mg/dL   Calcium 10.0 8.9 - 10.3 mg/dL   GFR calc non Af Amer 48 (L) >60 mL/min   GFR calc Af Amer 55 (L) >60 mL/min   Anion gap 12 5 - 15    Comment: Performed at [place] [address], [place], [place] [address]  CBC     Status: Abnormal   Collection Time: 06/28/18  3:13 AM  Result Value Ref Range   WBC 5.3 4.0 - 10.5 K/uL   RBC 3.89 3.87 - 5.11 MIL/uL   Hemoglobin 10.2 (L) 12.0 - 15.0 g/dL   HCT 30.7 (L) 36.0 - 46.0 %   MCV 78.9 (L) 80.0 - 100.0 fL   MCH 26.2 26.0 - 34.0 pg   MCHC 33.2 30.0 - 36.0 g/dL   RDW 15.6 (H) 11.5 - 15.5 %   Platelets 234 150 - 400 K/uL   nRBC 0.0 0.0 - 0.2 %    Comment: Performed at [facility], [place] [address] [place], [place] [address]  Basic metabolic panel     Status: Abnormal   Collection Time: 06/29/18  4:49 AM  Result Value Ref Range   Sodium 140 135 - 145 mmol/L   Potassium 3.5 3.5 - 5.1  mmol/L   Chloride 110 98 - 111 mmol/L   CO2 21 (L) 22 - 32 mmol/L   Glucose, Bld 180 (H) 70 - 99 mg/dL   BUN 20 8 - 23 mg/dL   Creatinine, Ser 1.07 (H) 0.44 - 1.00 mg/dL   Calcium 10.3 8.9 - 10.3 mg/dL   GFR calc non Af Amer 49 (L) >60 mL/min   GFR calc Af Amer 57 (L) >60 mL/min   Anion gap 9 5 -  15    Comment: Performed at [place] [address], [place], [place] [address]  CBC     Status: Abnormal   Collection Time: 06/29/18  4:49 AM  Result Value Ref Range   WBC 6.3 4.0 - 10.5 K/uL   RBC 3.59 (L) 3.87 - 5.11 MIL/uL   Hemoglobin 9.3 (L) 12.0 - 15.0 g/dL   HCT 28.5 (L) 36.0 - 46.0 %   MCV 79.4 (L) 80.0 - 100.0 fL   MCH 25.9 (L) 26.0 - 34.0 pg   MCHC 32.6 30.0 - 36.0 g/dL   RDW 15.7 (H) 11.5 - 15.5 %   Platelets 256 150 - 400 K/uL   nRBC 0.0 0.0 - 0.2 %    Comment: Performed at [facility], [place] [address] [place], [place] [address]  Respiratory Panel by PCR     Status: None   Collection Time: 06/29/18  3:00 PM  Result Value Ref Range   Adenovirus NOT DETECTED NOT DETECTED   Coronavirus 229E NOT DETECTED NOT DETECTED    Comment: (NOTE) The Coronavirus on the Respiratory Panel, DOES NOT test for the novel  Coronavirus (2019 nCoV)    Coronavirus HKU1 NOT DETECTED NOT DETECTED   Coronavirus NL63 NOT DETECTED NOT DETECTED   Coronavirus OC43 NOT DETECTED NOT DETECTED   Metapneumovirus NOT DETECTED NOT DETECTED   Rhinovirus / Enterovirus NOT DETECTED NOT DETECTED   Influenza A NOT DETECTED NOT DETECTED   Influenza B NOT DETECTED NOT DETECTED   Parainfluenza Virus 1 NOT DETECTED NOT DETECTED   Parainfluenza Virus 2 NOT DETECTED NOT DETECTED   Parainfluenza Virus 3 NOT DETECTED NOT DETECTED   Parainfluenza Virus 4 NOT DETECTED NOT DETECTED   Respiratory Syncytial Virus NOT DETECTED NOT DETECTED   Bordetella pertussis NOT DETECTED NOT DETECTED   Chlamydophila pneumoniae NOT DETECTED NOT DETECTED   Mycoplasma pneumoniae NOT DETECTED NOT DETECTED    Comment: Performed at [facility], [place] [address], [place], [place] [address]  Basic metabolic panel     Status: Abnormal   Collection Time: 06/30/18  3:48 AM  Result Value Ref Range   Sodium 140 135 - 145 mmol/L   Potassium 3.5 3.5 - 5.1 mmol/L   Chloride 108 98 - 111 mmol/L   CO2 23 22 - 32 mmol/L    Glucose, Bld 112 (H) 70 - 99 mg/dL   BUN 26 (H) 8 - 23 mg/dL   Creatinine, Ser 1.04 (H) 0.44 - 1.00 mg/dL   Calcium 11.4 (H) 8.9 - 10.3 mg/dL   GFR calc non Af Amer 51 (L) >60 mL/min   GFR calc Af Amer 59 (L) >60 mL/min   Anion gap 9 5 - 15    Comment: Performed at [place] [address], [place], [place] [address]  CBC     Status: Abnormal   Collection Time: 06/30/18  3:48 AM  Result Value Ref Range   WBC 9.2 4.0 - 10.5 K/uL   RBC  3.64 (L) 3.87 - 5.11 MIL/uL   Hemoglobin 9.3 (L) 12.0 - 15.0 g/dL   HCT 28.9 (L) 36.0 - 46.0 %   MCV 79.4 (L) 80.0 - 100.0 fL   MCH 25.5 (L) 26.0 - 34.0 pg   MCHC 32.2 30.0 - 36.0 g/dL   RDW 16.0 (H) 11.5 - 15.5 %   Platelets 290 150 - 400 K/uL   nRBC 0.0 0.0 - 0.2 %    Comment: Performed at [place] [address], [place], [place] [address]  Procalcitonin - Baseline     Status: None   Collection Time: 06/30/18  3:48 AM  Result Value Ref Range   Procalcitonin <0.10 ng/mL    Comment:        Interpretation: PCT (Procalcitonin) <= 0.5 ng/mL: Systemic infection (sepsis) is not likely. Local bacterial infection is possible. (NOTE)       Sepsis PCT Algorithm           Lower Respiratory Tract                                      Infection PCT Algorithm    ----------------------------     ----------------------------         PCT < 0.25 ng/mL                PCT < 0.10 ng/mL         Strongly encourage             Strongly discourage   discontinuation of antibiotics    initiation of antibiotics    ----------------------------     -----------------------------       PCT 0.25 - 0.50 ng/mL            PCT 0.10 - 0.25 ng/mL               OR       >80% decrease in PCT            Discourage initiation of                                            antibiotics      Encourage discontinuation           of antibiotics    ----------------------------     -----------------------------         PCT >= 0.50 ng/mL              PCT 0.26 - 0.50  ng/mL               AND        <80% decrease in PCT             Encourage initiation of                                             antibiotics       Encourage continuation           of antibiotics    ----------------------------     -----------------------------        PCT >= 0.50 ng/mL  PCT > 0.50 ng/mL               AND         increase in PCT                  Strongly encourage                                      initiation of antibiotics    Strongly encourage escalation           of antibiotics                                     -----------------------------                                           PCT <= 0.25 ng/mL                                                 OR                                        > 80% decrease in PCT                                     Discontinue / Do not initiate                                             antibiotics Performed at [facility], [address], [place], [place] [address]   Basic metabolic panel     Status: Abnormal   Collection Time: 07/01/18  2:47 AM  Result Value Ref Range   Sodium 141 135 - 145 mmol/L   Potassium 3.7 3.5 - 5.1 mmol/L   Chloride 107 98 - 111 mmol/L   CO2 24 22 - 32 mmol/L   Glucose, Bld 109 (H) 70 - 99 mg/dL   BUN 26 (H) 8 - 23 mg/dL   Creatinine, Ser 1.15 (H) 0.44 - 1.00 mg/dL   Calcium 11.2 (H) 8.9 - 10.3 mg/dL   GFR calc non Af Amer 45 (L) >60 mL/min   GFR calc Af Amer 52 (L) >60 mL/min   Anion gap 10 5 - 15    Comment: Performed at [place] [address], [place] [address]  CBC with Differential/Platelet     Status: Abnormal   Collection Time: 07/01/18  2:47 AM  Result Value Ref Range   WBC 7.9 4.0 - 10.5 K/uL   RBC 3.68 (L) 3.87 - 5.11 MIL/uL   Hemoglobin 9.7 (L) 12.0 - 15.0 g/dL   HCT 28.9 (L) 36.0 - 46.0 %   MCV 78.5 (L) 80.0 - 100.0 fL   MCH 26.4 26.0 - 34.0 pg   MCHC  33.6 30.0 - 36.0 g/dL   RDW 16.0 (H) 11.5 - 15.5 %   Platelets 312 150 - 400 K/uL    nRBC 0.0 0.0 - 0.2 %   Neutrophils Relative % 51 %   Neutro Abs 4.1 1.7 - 7.7 K/uL   Lymphocytes Relative 30 %   Lymphs Abs 2.3 0.7 - 4.0 K/uL   Monocytes Relative 13 %   Monocytes Absolute 1.0 0.1 - 1.0 K/uL   Eosinophils Relative 2 %   Eosinophils Absolute 0.2 0.0 - 0.5 K/uL   Basophils Relative 0 %   Basophils Absolute 0.0 0.0 - 0.1 K/uL   Immature Granulocytes 4 %   Abs Immature Granulocytes 0.29 (H) 0.00 - 0.07 K/uL    Comment: Performed at [facility] [address] [place], [place] [address]  Phosphorus     Status: None   Collection Time: 07/07/18 11:00 AM  Result Value Ref Range   Phosphorus 3.4 2.3 - 4.6 mg/dL  Comprehensive metabolic panel     Status: Abnormal   Collection Time: 07/07/18 11:00 AM  Result Value Ref Range   Sodium 143 135 - 145 mEq/L   Potassium 4.4 3.5 - 5.1 mEq/L   Chloride 102 96 - 112 mEq/L   CO2 32 19 - 32 mEq/L   Glucose, Bld 110 (H) 70 - 99 mg/dL   BUN 30 (H) 6 - 23 mg/dL   Creatinine, Ser 1.15 0.40 - 1.20 mg/dL   Total Bilirubin 0.5 0.2 - 1.2 mg/dL   Alkaline Phosphatase 65 39 - 117 U/L   AST 17 0 - 37 U/L   ALT 24 0 - 35 U/L   Total Protein 5.8 (L) 6.0 - 8.3 g/dL   Albumin 3.9 3.5 - 5.2 g/dL   Calcium 10.3 8.4 - 10.5 mg/dL   GFR 45.46 (L) >60.00 mL/min  CBC with Differential/Platelet     Status: Abnormal   Collection Time: 07/07/18 11:00 AM  Result Value Ref Range   WBC 7.1 4.0 - 10.5 K/uL   RBC 4.25 3.87 - 5.11 Mil/uL   Hemoglobin 11.5 (L) 12.0 - 15.0 g/dL   HCT 34.5 (L) 36.0 - 46.0 %   MCV 81.1 78.0 - 100.0 fl   MCHC 33.3 30.0 - 36.0 g/dL   RDW 16.8 (H) 11.5 - 15.5 %   Platelets 394.0 150.0 - 400.0 K/uL   Neutrophils Relative % 74.9 43.0 - 77.0 %   Lymphocytes Relative 15.3 12.0 - 46.0 %   Monocytes Relative 9.4 3.0 - 12.0 %   Eosinophils Relative 0.3 0.0 - 5.0 %   Basophils Relative 0.1 0.0 - 3.0 %   Neutro Abs 5.4 1.4 - 7.7 K/uL   Lymphs Abs 1.1 0.7 - 4.0 K/uL   Monocytes Absolute 0.7 0.1 - 1.0 K/uL   Eosinophils  Absolute 0.0 0.0 - 0.7 K/uL   Basophils Absolute 0.0 0.0 - 0.1 K/uL   Observations/Objective: No vitals obtained today  Pt is home on the couch with her husband Pt is in NAD  Assessment and Plan: 1. Mouth ulcers [facility] magic mouthwash sent to pharmacy   2. Non-Hodgkin lymphoma of intrathoracic lymph nodes, unspecified non-Hodgkin lymphoma type [facility]) Per oncology   3. Hypoxia Felt to be form fluid overload not pneumonia or from NHL Resolved with diuresis     Follow Up Instructions:    I discussed the assessment and treatment plan with the patient. The patient was provided an opportunity to ask questions and all were answered.  The patient agreed with the plan and demonstrated an understanding of the instructions.   The patient was advised to call back or seek an in-person evaluation if the symptoms worsen or if the condition fails to improve as anticipated.  I provided 25 minutes of non-face-to-face time during this encounter.   [name], DO

## 2018-07-09 NOTE — Assessment & Plan Note (Signed)
Per oncology Pt has phone conference with [facility] and Dr Maylon Peppers Friday

## 2018-07-10 ENCOUNTER — Telehealth: Payer: Self-pay | Admitting: *Deleted

## 2018-07-10 ENCOUNTER — Other Ambulatory Visit: Payer: Self-pay | Admitting: Family Medicine

## 2018-07-10 ENCOUNTER — Other Ambulatory Visit: Payer: Self-pay | Admitting: Hematology

## 2018-07-10 ENCOUNTER — Encounter: Payer: Self-pay | Admitting: Family Medicine

## 2018-07-10 DIAGNOSIS — C8580 Other specified types of non-Hodgkin lymphoma, unspecified site: Secondary | ICD-10-CM | POA: Diagnosis not present

## 2018-07-10 MED ORDER — AMLODIPINE BESYLATE 5 MG PO TABS: 5.0000 mg | ORAL_TABLET | Freq: Every day | ORAL | 1 refills | Status: DC

## 2018-07-10 NOTE — Telephone Encounter (Signed)
Received Physician Orders from [facility]; forwarded to provider/SLS 05/08

## 2018-07-10 NOTE — Telephone Encounter (Signed)
Received Physician Orders from [facility]; forwarded to provider/SLS 05/08  

## 2018-07-10 NOTE — Telephone Encounter (Signed)
Ok to refill for 90 days with 1 refill---- I would have sent but I did not know if she wanted it to go to [place] or not

## 2018-07-13 ENCOUNTER — Encounter: Payer: Self-pay | Admitting: *Deleted

## 2018-07-13 ENCOUNTER — Other Ambulatory Visit: Payer: Self-pay | Admitting: Hematology

## 2018-07-13 NOTE — Progress Notes (Signed)
Patient wants to proceed with chemo treatment.  Appointment scheduled for port placement, chemo education and follow up with Dr Maylon Peppers to discuss treatment specifics.   All appointments scheduled and patient is aware of all dates, times and locations. She will reach out if she has any questions or concerns.

## 2018-07-13 NOTE — Progress Notes (Signed)
START ON PATHWAY REGIMEN - Lymphoma and CLL     A cycle is every 21 days:     Prednisone      Rituximab-xxxx      Cyclophosphamide      Doxorubicin      Vincristine   **Always confirm dose/schedule in your pharmacy ordering system**  Patient Characteristics: Diffuse Large B-Cell Lymphoma or Follicular Lymphoma, Grade 3B, First Line, Stage III and IV Disease Type: Not Applicable Disease Type: Diffuse Large B-Cell Lymphoma Disease Type: Not Applicable Line of therapy: First Line [place] Stage: IV Intent of Therapy: Non-Curative / Palliative Intent, Discussed with Patient

## 2018-07-14 ENCOUNTER — Other Ambulatory Visit: Payer: Self-pay | Admitting: Radiology

## 2018-07-14 DIAGNOSIS — Z8701 Personal history of pneumonia (recurrent): Secondary | ICD-10-CM | POA: Diagnosis not present

## 2018-07-14 DIAGNOSIS — I129 Hypertensive chronic kidney disease with stage 1 through stage 4 chronic kidney disease, or unspecified chronic kidney disease: Secondary | ICD-10-CM | POA: Diagnosis not present

## 2018-07-14 DIAGNOSIS — C8590 Non-Hodgkin lymphoma, unspecified, unspecified site: Secondary | ICD-10-CM | POA: Diagnosis not present

## 2018-07-14 DIAGNOSIS — Z5181 Encounter for therapeutic drug level monitoring: Secondary | ICD-10-CM | POA: Diagnosis not present

## 2018-07-14 DIAGNOSIS — N183 Chronic kidney disease, stage 3 (moderate): Secondary | ICD-10-CM | POA: Diagnosis not present

## 2018-07-15 ENCOUNTER — Encounter (HOSPITAL_COMMUNITY): Payer: Self-pay

## 2018-07-15 ENCOUNTER — Ambulatory Visit (HOSPITAL_COMMUNITY)
Admission: RE | Admit: 2018-07-15 | Discharge: 2018-07-15 | Disposition: A | Discharge status: Home / Self Care | Payer: Medicare Other | Source: Ambulatory Visit | Attending: Hematology | Admitting: Hematology

## 2018-07-15 ENCOUNTER — Other Ambulatory Visit: Payer: Self-pay

## 2018-07-15 DIAGNOSIS — Z888 Allergy status to other drugs, medicaments and biological substances status: Secondary | ICD-10-CM | POA: Diagnosis not present

## 2018-07-15 DIAGNOSIS — E785 Hyperlipidemia, unspecified: Secondary | ICD-10-CM | POA: Diagnosis not present

## 2018-07-15 DIAGNOSIS — Z9851 Tubal ligation status: Secondary | ICD-10-CM | POA: Diagnosis not present

## 2018-07-15 DIAGNOSIS — Z79899 Other long term (current) drug therapy: Secondary | ICD-10-CM | POA: Insufficient documentation

## 2018-07-15 DIAGNOSIS — C8580 Other specified types of non-Hodgkin lymphoma, unspecified site: Secondary | ICD-10-CM | POA: Diagnosis not present

## 2018-07-15 DIAGNOSIS — H3530 Unspecified macular degeneration: Secondary | ICD-10-CM | POA: Diagnosis not present

## 2018-07-15 DIAGNOSIS — K219 Gastro-esophageal reflux disease without esophagitis: Secondary | ICD-10-CM | POA: Diagnosis not present

## 2018-07-15 DIAGNOSIS — Z452 Encounter for adjustment and management of vascular access device: Secondary | ICD-10-CM | POA: Diagnosis not present

## 2018-07-15 DIAGNOSIS — Z881 Allergy status to other antibiotic agents status: Secondary | ICD-10-CM | POA: Insufficient documentation

## 2018-07-15 DIAGNOSIS — Z8041 Family history of malignant neoplasm of ovary: Secondary | ICD-10-CM | POA: Insufficient documentation

## 2018-07-15 DIAGNOSIS — Z8249 Family history of ischemic heart disease and other diseases of the circulatory system: Secondary | ICD-10-CM | POA: Insufficient documentation

## 2018-07-15 DIAGNOSIS — Z8042 Family history of malignant neoplasm of prostate: Secondary | ICD-10-CM | POA: Diagnosis not present

## 2018-07-15 DIAGNOSIS — M8580 Other specified disorders of bone density and structure, unspecified site: Secondary | ICD-10-CM | POA: Diagnosis not present

## 2018-07-15 DIAGNOSIS — M1990 Unspecified osteoarthritis, unspecified site: Secondary | ICD-10-CM | POA: Insufficient documentation

## 2018-07-15 DIAGNOSIS — Z803 Family history of malignant neoplasm of breast: Secondary | ICD-10-CM | POA: Diagnosis not present

## 2018-07-15 DIAGNOSIS — Z79890 Hormone replacement therapy (postmenopausal): Secondary | ICD-10-CM | POA: Diagnosis not present

## 2018-07-15 DIAGNOSIS — E039 Hypothyroidism, unspecified: Secondary | ICD-10-CM | POA: Insufficient documentation

## 2018-07-15 DIAGNOSIS — C8590 Non-Hodgkin lymphoma, unspecified, unspecified site: Secondary | ICD-10-CM | POA: Diagnosis not present

## 2018-07-15 HISTORY — PX: IR IMAGING GUIDED PORT INSERTION: IMG5740

## 2018-07-15 LAB — CBC WITH DIFFERENTIAL/PLATELET
Abs Immature Granulocytes: 0.08 10*3/uL — ABNORMAL HIGH (ref 0.00–0.07)
Basophils Absolute: 0.1 10*3/uL (ref 0.0–0.1)
Basophils Relative: 1 %
Eosinophils Absolute: 0.2 10*3/uL (ref 0.0–0.5)
Eosinophils Relative: 4 %
HCT: 38 % (ref 36.0–46.0)
Hemoglobin: 11.4 g/dL — ABNORMAL LOW (ref 12.0–15.0)
Immature Granulocytes: 1 %
Lymphocytes Relative: 22 %
Lymphs Abs: 1.2 10*3/uL (ref 0.7–4.0)
MCH: 26.3 pg (ref 26.0–34.0)
MCHC: 30 g/dL (ref 30.0–36.0)
MCV: 87.8 fL (ref 80.0–100.0)
Monocytes Absolute: 0.8 10*3/uL (ref 0.1–1.0)
Monocytes Relative: 14 %
Neutro Abs: 3.2 10*3/uL (ref 1.7–7.7)
Neutrophils Relative %: 58 %
Platelets: 181 10*3/uL (ref 150–400)
RBC: 4.33 MIL/uL (ref 3.87–5.11)
RDW: 16.3 % — ABNORMAL HIGH (ref 11.5–15.5)
WBC: 5.5 10*3/uL (ref 4.0–10.5)
nRBC: 0 % (ref 0.0–0.2)

## 2018-07-15 LAB — PROTIME-INR
INR: 1 (ref 0.8–1.2)
Prothrombin Time: 13 seconds (ref 11.4–15.2)

## 2018-07-15 MED ORDER — CEFAZOLIN SODIUM-DEXTROSE 2-4 GM/100ML-% IV SOLN
2.0000 g | INTRAVENOUS | Status: AC
  Administered 2018-07-15: 13:00:00 2 g via INTRAVENOUS

## 2018-07-15 MED ORDER — LIDOCAINE HCL 1 % IJ SOLN
INTRAMUSCULAR | Status: AC
  Filled 2018-07-15: qty 20

## 2018-07-15 MED ORDER — CEFAZOLIN SODIUM-DEXTROSE 2-4 GM/100ML-% IV SOLN
INTRAVENOUS | Status: AC
  Administered 2018-07-15: 13:00:00 2 g via INTRAVENOUS
  Filled 2018-07-15: qty 100

## 2018-07-15 MED ORDER — LIDOCAINE-EPINEPHRINE 2 %-1:100000 IJ SOLN
INTRAMUSCULAR | Status: AC | PRN
  Administered 2018-07-15: 10 mL

## 2018-07-15 MED ORDER — MIDAZOLAM HCL 2 MG/2ML IJ SOLN
INTRAMUSCULAR | Status: AC | PRN
  Administered 2018-07-15: 1 mg via INTRAVENOUS
  Administered 2018-07-15 (×4): 0.5 mg via INTRAVENOUS

## 2018-07-15 MED ORDER — FENTANYL CITRATE (PF) 100 MCG/2ML IJ SOLN
INTRAMUSCULAR | Status: AC
  Filled 2018-07-15: qty 2

## 2018-07-15 MED ORDER — SODIUM CHLORIDE 0.9 % IV SOLN
INTRAVENOUS | Status: DC
  Administered 2018-07-15: 13:00:00 via INTRAVENOUS

## 2018-07-15 MED ORDER — LIDOCAINE-EPINEPHRINE 1 %-1:100000 IJ SOLN
INTRAMUSCULAR | Status: AC
  Filled 2018-07-15: qty 1

## 2018-07-15 MED ORDER — FENTANYL CITRATE (PF) 100 MCG/2ML IJ SOLN
INTRAMUSCULAR | Status: AC | PRN
  Administered 2018-07-15 (×4): 25 ug via INTRAVENOUS

## 2018-07-15 MED ORDER — HEPARIN SOD (PORK) LOCK FLUSH 100 UNIT/ML IV SOLN
INTRAVENOUS | Status: AC
  Filled 2018-07-15: qty 5

## 2018-07-15 MED ORDER — HEPARIN SOD (PORK) LOCK FLUSH 100 UNIT/ML IV SOLN
INTRAVENOUS | Status: AC | PRN
  Administered 2018-07-15: 500 [IU] via INTRAVENOUS

## 2018-07-15 MED ORDER — MIDAZOLAM HCL 2 MG/2ML IJ SOLN
INTRAMUSCULAR | Status: AC
  Filled 2018-07-15: qty 2

## 2018-07-15 MED ORDER — LIDOCAINE HCL (PF) 1 % IJ SOLN
INTRAMUSCULAR | Status: AC | PRN
  Administered 2018-07-15: 10 mL

## 2018-07-15 NOTE — Discharge Instructions (Addendum)
Do not use EMLA cream on skin glue over your new port until the [facility] tell you it is okay to use. The petroleum in the EMLA cream will dissolve the skin glue and the incision will come apart result in an infection. Use ice in a ziplock bag for 3-4 minutes prior to nurses accessing your port.     Implanted [facility] Guide An implanted port is a device that is placed under the skin. It is usually placed in the chest. The device can be used to give IV medicine, to take blood, or for dialysis. You may have an implanted port if:  You need IV medicine that would be irritating to the small veins in your hands or arms.  You need IV medicines, such as antibiotics, for a long period of time.  You need IV nutrition for a long period of time.  You need dialysis. Having a port means that your health care provider will not need to use the veins in your arms for these procedures. You may have fewer limitations when using a port than you would if you used other types of long-term IVs, and you will likely be able to return to normal activities after your incision heals. An implanted port has two main parts:  Reservoir. The reservoir is the part where a needle is inserted to give medicines or draw blood. The reservoir is round. After it is placed, it appears as a small, raised area under your skin.  Catheter. The catheter is a thin, flexible tube that connects the reservoir to a vein. Medicine that is inserted into the reservoir goes into the catheter and then into the vein. How is my port accessed? To access your port:  A numbing cream may be placed on the skin over the port site.  Your health care provider will put on a mask and sterile gloves.  The skin over your port will be cleaned carefully with a germ-killing soap and allowed to dry.  Your health care provider will gently pinch the port and insert a needle into it.  Your health care provider will check for a blood return to make  sure the port is in the vein and is not clogged.  If your port needs to remain accessed to get medicine continuously (constant infusion), your health care provider will place a clear bandage (dressing) over the needle site. The dressing and needle will need to be changed every week, or as told by your health care provider. What is flushing? Flushing helps keep the port from getting clogged. Follow instructions from your health care provider about how and when to flush the port. Ports are usually flushed with saline solution or a medicine called heparin. The need for flushing will depend on how the port is used:  If the port is only used from time to time to give medicines or draw blood, the port may need to be flushed: ? Before and after medicines have been given. ? Before and after blood has been drawn. ? As part of routine maintenance. Flushing may be recommended every 4-6 weeks.  If a constant infusion is running, the port may not need to be flushed.  Throw away any syringes in a disposal container that is meant for sharp items (sharps container). You can buy a sharps container from a pharmacy, or you can make one by using an empty hard plastic bottle with a cover. How long will my port stay implanted? The port can stay  in for as long as your health care provider thinks it is needed. When it is time for the port to come out, a surgery will be done to remove it. The surgery will be similar to the procedure that was done to put the port in. Follow these instructions at home:   Flush your port as told by your health care provider.  If you need an infusion over several days, follow instructions from your health care provider about how to take care of your port site. Make sure you: ? Wash your hands with soap and water before you change your dressing. If soap and water are not available, use alcohol-based hand sanitizer. ? Change your dressing as told by your health care provider. ? Place any  used dressings or infusion bags into a plastic bag. Throw that bag in the trash. ? Keep the dressing that covers the needle clean and dry. Do not get it wet. ? Do not use scissors or sharp objects near the tube. ? Keep the tube clamped, unless it is being used.  Check your port site every day for signs of infection. Check for: ? Redness, swelling, or pain. ? Fluid or blood. ? Pus or a bad smell.  Protect the skin around the port site. ? Avoid wearing bra straps that rub or irritate the site. ? Protect the skin around your port from seat belts. Place a soft pad over your chest if needed.  Bathe or shower as told by your health care provider. The site may get wet as long as you are not actively receiving an infusion.  Return to your normal activities as told by your health care provider. Ask your health care provider what activities are safe for you.  Carry a medical alert card or wear a medical alert bracelet at all times. This will let health care providers know that you have an implanted port in case of an emergency. Get help right away if:  You have redness, swelling, or pain at the port site.  You have fluid or blood coming from your port site.  You have pus or a bad smell coming from the port site.  You have a fever. Summary  Implanted ports are usually placed in the chest for long-term IV access.  Follow instructions from your health care provider about flushing the port and changing bandages (dressings).  Take care of the area around your port by avoiding clothing that puts pressure on the area, and by watching for signs of infection.  Protect the skin around your port from seat belts. Place a soft pad over your chest if needed.  Get help right away if you have a fever or you have redness, swelling, pain, drainage, or a bad smell at the port site. This information is not intended to replace advice given to you by your health care provider. Make sure you discuss any  questions you have with your health care provider. Document Released: 02/18/2005 Document Revised: 03/23/2016 Document Reviewed: 03/23/2016 Elsevier Interactive Patient Education  2019 [place].    Moderate Conscious Sedation, Adult, Care After These instructions provide you with information about caring for yourself after your procedure. Your health care provider may also give you more specific instructions. Your treatment has been planned according to current medical practices, but problems sometimes occur. Call your health care provider if you have any problems or questions after your procedure. What can I expect after the procedure? After your procedure, it is common:  To  feel sleepy for several hours.  To feel clumsy and have poor balance for several hours.  To have poor judgment for several hours.  To vomit if you eat too soon. Follow these instructions at home: For at least 24 hours after the procedure:   Do not: ? Participate in activities where you could fall or become injured. ? Drive. ? Use heavy machinery. ? Drink alcohol. ? Take sleeping pills or medicines that cause drowsiness. ? Make important decisions or sign legal documents. ? Take care of children on your own.  Rest. Eating and drinking  Follow the diet recommended by your health care provider.  If you vomit: ? Drink water, juice, or soup when you can drink without vomiting. ? Make sure you have little or no nausea before eating solid foods. General instructions  Have a responsible adult stay with you until you are awake and alert.  Take over-the-counter and prescription medicines only as told by your health care provider.  If you smoke, do not smoke without supervision.  Keep all follow-up visits as told by your health care provider. This is important. Contact a health care provider if:  You keep feeling nauseous or you keep vomiting.  You feel light-headed.  You develop a rash.  You  have a fever. Get help right away if:  You have trouble breathing. This information is not intended to replace advice given to you by your health care provider. Make sure you discuss any questions you have with your health care provider. Document Released: 12/09/2012 Document Revised: 07/24/2015 Document Reviewed: 06/10/2015 Elsevier Interactive Patient Education  2019 [employer].

## 2018-07-15 NOTE — Consult Note (Signed)
Chief Complaint: Patient was seen in consultation today for port a cath placement  Referring Physician(s): Zhao,Yan  Supervising Physician: Markus Daft  Patient Status: [facility] - Out-pt  History of Present Illness: Amanda [name] is a 78 y.o. 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  Additional medical history as below.  Past Medical History:  Diagnosis Date   Anemia    Arthritis    Cancer ([place])    Constipation, chronic    GERD (gastroesophageal reflux disease)    zantac   Heart murmur    History of blood transfusion 1959   [place]   Hyperlipidemia    Hypothyroidism    Lymphoproliferative disorder ([place])    Macular degeneration 2013   Both eyes    Osteopenia    Pneumonia    PONV (postoperative nausea and vomiting)    needs little anesthesia   Shingles    Shortness of breath    on exertion   Spleen enlarged    SUI (stress urinary incontinence, female)    Wears glasses     Past Surgical History:  Procedure Laterality Date   BREAST EXCISIONAL BIOPSY Left [place]   CATARACT EXTRACTION  2009, 2011   BOTH EYES   CESAREAN SECTION  1959   COLONOSCOPY      Dr [place]     X2   HYSTEROSCOPY W/D&C  01/07/2012   Procedure: Ball /HYSTEROSCOPY;  Surgeon: Terrance Mass, MD;  Location: [facility];  Service: Gynecology;  Laterality: N/A;  intrauterine foley catheter for tamponode    LYMPH NODE BIOPSY Left 05/26/2018   Procedure: LEFT AXILLARY LYMPH NODE BIOPSY;  Surgeon: Fanny Skates, MD;  Location: [place];  Service: General;  Laterality: Left;   TONSILLECTOMY     TONSILLECTOMY AND ADENOIDECTOMY     TUBAL LIGATION     BY LAPAROSCOPY   WISDOM TOOTH EXTRACTION      Allergies: Phenergan [promethazine hcl]; Levofloxacin; and Pravastatin  sodium  Medications: Prior to Admission medications   Medication Sig Start Date End Date Taking? Authorizing Provider  acetaminophen (TYLENOL) 500 MG tablet Take 1,000 mg by mouth every 6 (six) hours as needed for mild pain.   Yes [provider]  albuterol (PROVENTIL) (2.5 MG/3ML) 0.083% nebulizer solution Take 3 mLs (2.5 mg total) by nebulization every 6 (six) hours as needed for wheezing or shortness of breath. 04/24/18  Yes Carollee Herter, Yvonne R, DO  amLODipine (NORVASC) 5 MG tablet Take 1 tablet (5 mg total) by mouth daily with breakfast. 07/10/18  Yes Carollee Herter, Yvonne R, DO  buPROPion (WELLBUTRIN XL) 150 MG 24 hr tablet TAKE 1 TABLET BY MOUTH EVERY DAY 07/03/18  Yes Roma Schanz R, DO  famotidine (PEPCID) 20 MG tablet One at bedtime Patient taking differently: Take 20 mg by mouth at bedtime. One at bedtime  06/05/18  Yes Tanda Rockers, MD  feeding supplement, ENSURE ENLIVE, (ENSURE ENLIVE) LIQD Take 237 mLs by mouth 2 (two) times daily between meals. 06/20/18  Yes Mariel Aloe, MD  levothyroxine (SYNTHROID, LEVOTHROID) 125 MCG tablet TAKE 1 TABLET BY MOUTH DAILY 05/15/18  Yes Roma Schanz R, DO  magic mouthwash w/lidocaine SOLN Take 5 mLs by mouth 4 (four) times daily as needed for mouth pain. Swish and Spit 07/06/18  Yes Roma Schanz R, DO  omeprazole (  PRILOSEC) 20 MG capsule Take 2 x 30 min before breakfast Patient taking differently: Take 20 mg by mouth daily.  06/05/18  Yes Tanda Rockers, MD  ondansetron (ZOFRAN) 4 MG tablet Take 1 tablet (4 mg total) by mouth every 8 (eight) hours as needed for nausea or vomiting. 06/08/18  Yes Roma Schanz R, DO  predniSONE (STERAPRED UNI-PAK 21 TAB) 10 MG (21) TBPK tablet Per package insert 07/01/18  Yes Spongberg, Audie Pinto, MD  albuterol (PROVENTIL HFA;VENTOLIN HFA) 108 (90 Base) MCG/ACT inhaler Inhale 2 puffs into the lungs every 6 (six) hours as needed for up to 30 days for wheezing or shortness of breath.  05/22/18 06/21/18  Tish Men, MD  potassium chloride 20 MEQ TBCR Take 20 mEq by mouth daily for 3 days. 06/21/18 06/24/18  Mariel Aloe, MD     Family History  Problem Relation Age of Onset   Ovarian cancer Mother    Breast cancer Mother 69   Hypertension Father    Prostate cancer Father    Kidney failure Father    Diabetes Father    Hyperlipidemia Brother    COPD Paternal Grandfather    Stroke Maternal Grandfather    Hypercalcemia Neg Hx     Social History   Socioeconomic History   Marital status: Married    Spouse name: Not on file   Number of children: Not on file   Years of education: Not on file   Highest education level: Not on file  Occupational History   Not on file  Social Needs   Financial resource strain: Not on file   Food insecurity:    Worry: Not on file    Inability: Not on file   Transportation needs:    Medical: Not on file    Non-medical: Not on file  Tobacco Use   Smoking status: Never Smoker   Smokeless tobacco: Never Used  Substance and Sexual Activity   Alcohol use: Yes    Alcohol/week: 0.0 standard drinks    Comment: Red wine occasionally   Drug use: No   Sexual activity: Never    Birth control/protection: Post-menopausal  Lifestyle   Physical activity:    Days per week: Not on file    Minutes per session: Not on file   Stress: Not on file  Relationships   Social connections:    Talks on phone: Not on file    Gets together: Not on file    Attends religious service: Not on file    Active member of club or organization: Not on file    Attends meetings of clubs or organizations: Not on file    Relationship status: Not on file  Other Topics Concern   Not on file  Social History Narrative   Not on file      Review of Systems currently denies fever, headache, dyspnea, chest pain, cough, abdominal/back pain, nausea, vomiting or bleeding.  Vital Signs: Pressure 133/66, heart rate 74, temp 98.6, respirations  19, O2 sats 100% room air Ht 5\' 1"  (1.549 m)    Wt 135 lb (61.2 kg)    BMI 25.51 kg/m   Physical Exam awake, alert.  Chest with slightly diminished breath sounds bases.  Heart with regular rate and rhythm, soft murmur.  Abdomen soft, positive bowel sounds, currently nontender.  No significant lower extremity edema.  Imaging: Dg Chest 2 View  Result Date: 07/01/2018 CLINICAL DATA:  Shortness of breath and cough EXAM: CHEST - 2 VIEW COMPARISON:  June 30, 2018 FINDINGS: There are pleural effusions bilaterally, larger on the left than on the right, stable. There is atelectatic change in the bases with patchy airspace disease in the bases as well, stable. No new opacity evident. Heart is upper normal in size with pulmonary vascularity normal. No adenopathy. No bone lesions. IMPRESSION: Bilateral pleural effusions, larger on the left than on the right, with bibasilar atelectasis and patchy airspace opacity, essentially stable. Stable heart upper normal in size. No adenopathy evident. Electronically Signed   By: Lowella Grip III M.D.   On: 07/01/2018 08:03   Dg Chest 2 View  Result Date: 06/30/2018 CLINICAL DATA:  SHORTNESS OF BREATH.  COUGH.  PLEURAL EFFUSIONS. EXAM: CHEST - 2 VIEW COMPARISON:  TWO-VIEW CHEST X-RAY 06/27/2018 FINDINGS: Heart is enlarged. Left greater than right pleural effusions and basilar airspace disease are again seen. Aeration is improving. Mild pulmonary vascular congestion is present as well. IMPRESSION: 1. Improving aeration of both lungs. 2. Persistent pleural effusions and airspace disease, left greater than right. This remains concerning for infection. Electronically Signed   By: San Morelle M.D.   On: 06/30/2018 08:52   Dg Chest 2 View  Result Date: 06/16/2018 CLINICAL DATA:  Three weeks of coughing. Recent diagnosis of lymphoma. EXAM: CHEST - 2 VIEW COMPARISON:  May 18, 2018 FINDINGS: Continued prominence of the mediastinum consistent with known  adenopathy/lymphoma. The cardiomediastinal silhouette is stable. No pneumothorax. Interstitial and parenchymal opacity in the bases persists but is mildly improved since May 18, 2018. No other changes. IMPRESSION: 1. Continued adenopathy in the mediastinum. 2. Continued interstitial and parenchymal opacities in the bases, mildly improved since May 18, 2018. These opacities were favored to represent lymphomatous involvement based on a PET-CT from May 29, 2018. Electronically Signed   By: Dorise Bullion III M.D   On: 06/16/2018 13:09   Ct Angio Chest Pe W And/or Wo Contrast  Result Date: 06/26/2018 CLINICAL DATA:  Shortness of breath with cough and elevated D-dimer levels. Symptoms for several weeks. Recent diagnosis of lymphoma. EXAM: CT ANGIOGRAPHY CHEST WITH CONTRAST TECHNIQUE: Multidetector CT imaging of the chest was performed using the standard protocol during bolus administration of intravenous contrast. Multiplanar CT image reconstructions and MIPs were obtained to evaluate the vascular anatomy. CONTRAST:  50mL OMNIPAQUE IOHEXOL 350 MG/ML SOLN COMPARISON:  Radiographs today, PET-CT 05/29/2018 and chest CT 05/19/2018. FINDINGS: Cardiovascular: The pulmonary arteries are well opacified with contrast to the level of the subsegmental branches. There is no evidence of acute pulmonary embolism. Stable mild atherosclerosis of the aorta, great vessels and coronary arteries. The heart is mildly enlarged. No significant pericardial effusion. Mediastinum/Nodes: Again demonstrated is extensive supraclavicular, mediastinal, hilar and axillary lymphadenopathy bilaterally. Some of these nodes have enlarged compared with the previous study, including a 1.7 cm right axillary node on image 27/6 and a 1.9 cm left axillary node on image 40/6. 2.3 cm AP window node on image 30/6 and 2.5 cm subcarinal node on image 50/6 are similar to the previous study. The thyroid gland, trachea and esophagus demonstrate no significant  findings. Lungs/Pleura: There are enlarging, moderate size dependent bilateral pleural effusions. There is worsening aeration of the lungs. Chronic collapse and bronchiectasis in the right middle lobe and lingula have progressed. There is new partial collapse and opacification of both lower lobes. Underlying nodularity within the aerated portions of the lungs is grossly stable. Upper abdomen: There are multiple mildly enlarged lymph nodes within the porta hepatis and upper retroperitoneum. Stable mild splenomegaly  without focal abnormality. There is mild reflux of contrast into the hepatic vein and IVC. Musculoskeletal/Chest wall: There is no chest wall mass or suspicious osseous finding. Review of the MIP images confirms the above findings. IMPRESSION: 1. No evidence of acute pulmonary embolism. 2. Extensive thoracic adenopathy, mildly progressive from previous CT of last month, consistent with slight worsening of lymphoma. Splenomegaly and upper abdominal adenopathy are grossly stable. 3. Enlarging bilateral pleural effusions with worsening airspace opacities in both lower lobes, the right middle lobe and lingula. Underlying pulmonary nodularity is grossly stable. Pattern is nonspecific and could be secondary to superimposed atelectasis, infection or edema. Electronically Signed   By: Richardean Sale M.D.   On: 06/26/2018 17:37   Dg Chest Port 1 View  Result Date: 06/27/2018 CLINICAL DATA:  Current history of lymphoma with shortness of breath and cough. Follow-up effusions and possible pneumonia. EXAM: PORTABLE CHEST 1 VIEW COMPARISON:  CTA chest yesterday. Chest x-rays yesterday and earlier. FINDINGS: Cardiac silhouette mildly enlarged, unchanged. Thoracic aorta ectatic and atherosclerotic, unchanged. Since yesterday, no change in the moderately large BILATERAL pleural effusions, LEFT greater than RIGHT, and the airspace consolidation in the BILATERAL LOWER LOBES, RIGHT MIDDLE LOBE and lingula. Pulmonary  vascularity normal. 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 IMPRESSION: 1. Stable moderately large BILATERAL pleural effusions, LEFT greater than RIGHT, and associated passive atelectasis and/or pneumonia in the BILATERAL LOWER LOBES, RIGHT MIDDLE LOBE and lingula. 2. Interstitial opacities throughout both lungs, unchanged since yesterday's examinations but new since 06/16/2018, query lymphangitic carcinomatosis. Interstitial edema is felt less likely since pulmonary vascularity is normal. Electronically Signed   By: Evangeline Dakin M.D.   On: 06/27/2018 19:11   Dg Chest Port 1 View  Result Date: 06/26/2018 CLINICAL DATA:  Cough, shortness of breath EXAM: PORTABLE CHEST 1 VIEW COMPARISON:  06/16/2018 FINDINGS: Bilateral mild interstitial thickening increased from the prior exam. Bilateral small pleural effusions. No pneumothorax. Stable cardiomediastinal silhouette. No acute osseous abnormality. IMPRESSION: 1. Bilateral interstitial thickening primarily at the lung bases and small pleural effusions. Differential considerations include pulmonary edema versus pneumonia. Electronically Signed   By: Kathreen Devoid   On: 06/26/2018 12:45    Labs:  CBC: Recent Labs    06/30/18 0348 07/01/18 0247 07/07/18 1100 07/15/18 1226  WBC 9.2 7.9 7.1 5.5  HGB 9.3* 9.7* 11.5* 11.4*  HCT 28.9* 28.9* 34.5* 38.0  PLT 290 312 394.0 181    COAGS: Recent Labs    07/15/18 1226  INR 1.0    BMP: Recent Labs    06/28/18 0313 06/29/18 0449 06/30/18 0348 07/01/18 0247 07/07/18 1100  NA 137 140 140 141 143  K 3.8 3.5 3.5 3.7 4.4  CL 105 110 108 107 102  CO2 20* 21* 23 24 32  GLUCOSE 148* 180* 112* 109* 110*  BUN 12 20 26* 26* 30*  CALCIUM 10.0 10.3 11.4* 11.2* 10.3  CREATININE 1.10* 1.07* 1.04* 1.15*  1.15  GFRNONAA 48* 49* 51* 45*  --   GFRAA 55* 57* 59* 52*  --     LIVER FUNCTION TESTS: Recent Labs    06/26/18 1147 06/27/18 0450 06/27/18 1856 07/07/18 1100  BILITOT 0.8 0.7 0.7 0.5  AST 28 22 28 17   ALT 15 13 17 24   ALKPHOS 72 61 65 65  PROT 6.6 5.0* 5.4* 5.8*  ALBUMIN 3.6 2.8*  2.9* 3.9    TUMOR MARKERS: No results for input(s): AFPTM, CEA, CA199, CHROMGRNA in the last 8760 hours.  Assessment and Plan:  78 y.o. 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 Risks and benefits of image guided port-a-catheter placement was discussed with the patient including, but not limited to bleeding, infection, pneumothorax, or fibrin sheath development and need for additional procedures.  All of the patient's questions were answered, patient is agreeable to proceed. Consent signed and in chart.     Thank you for this interesting consult.  I greatly enjoyed meeting YETZALI WELD and look forward to participating in their care.  A copy of this report was sent to the requesting provider on this date.  Electronically Signed: D. Rowe Robert, PA-C 07/15/2018, 12:50 PM   I spent a total of  20 minutes   in face to face in clinical consultation, greater than 50% of which was counseling/coordinating care for Port-A-Cath placement

## 2018-07-15 NOTE — Procedures (Signed)
Interventional Radiology Procedure: ° ° °Indications: Lymphoma ° °Procedure: Port placement ° °Findings: Right jugular port, tip at SVC/RA junction ° °Complications: None °    °EBL: Minimal, less than 10 ml ° °Plan: Discharge in one hour.  Keep port site and incisions dry for at least 24 hours.   ° ° °Ellianne Gowen R. Rodneshia Greenhouse, MD  °Pager: [phone] ° ° °

## 2018-07-16 ENCOUNTER — Inpatient Hospital Stay (HOSPITAL_BASED_OUTPATIENT_CLINIC_OR_DEPARTMENT_OTHER): Payer: Medicare Other | Admitting: Hematology

## 2018-07-16 ENCOUNTER — Inpatient Hospital Stay: Payer: Medicare Other

## 2018-07-16 ENCOUNTER — Inpatient Hospital Stay: Payer: Medicare Other | Attending: Hematology

## 2018-07-16 ENCOUNTER — Telehealth: Payer: Self-pay | Admitting: *Deleted

## 2018-07-16 ENCOUNTER — Encounter: Payer: Self-pay | Admitting: *Deleted

## 2018-07-16 ENCOUNTER — Other Ambulatory Visit: Payer: Self-pay

## 2018-07-16 ENCOUNTER — Encounter: Payer: Self-pay | Admitting: Hematology

## 2018-07-16 VITALS — BP 127/70 | HR 77 | Resp 19 | Ht 61.0 in | Wt 135.0 lb

## 2018-07-16 DIAGNOSIS — T451X5A Adverse effect of antineoplastic and immunosuppressive drugs, initial encounter: Secondary | ICD-10-CM | POA: Diagnosis not present

## 2018-07-16 DIAGNOSIS — I10 Essential (primary) hypertension: Secondary | ICD-10-CM | POA: Diagnosis not present

## 2018-07-16 DIAGNOSIS — I129 Hypertensive chronic kidney disease with stage 1 through stage 4 chronic kidney disease, or unspecified chronic kidney disease: Secondary | ICD-10-CM | POA: Diagnosis not present

## 2018-07-16 DIAGNOSIS — Z5112 Encounter for antineoplastic immunotherapy: Secondary | ICD-10-CM | POA: Insufficient documentation

## 2018-07-16 DIAGNOSIS — Z79899 Other long term (current) drug therapy: Secondary | ICD-10-CM | POA: Diagnosis not present

## 2018-07-16 DIAGNOSIS — C8580 Other specified types of non-Hodgkin lymphoma, unspecified site: Secondary | ICD-10-CM

## 2018-07-16 DIAGNOSIS — Z5181 Encounter for therapeutic drug level monitoring: Secondary | ICD-10-CM | POA: Diagnosis not present

## 2018-07-16 DIAGNOSIS — Z5111 Encounter for antineoplastic chemotherapy: Secondary | ICD-10-CM | POA: Insufficient documentation

## 2018-07-16 DIAGNOSIS — K219 Gastro-esophageal reflux disease without esophagitis: Secondary | ICD-10-CM | POA: Insufficient documentation

## 2018-07-16 DIAGNOSIS — Z7951 Long term (current) use of inhaled steroids: Secondary | ICD-10-CM | POA: Diagnosis not present

## 2018-07-16 DIAGNOSIS — J90 Pleural effusion, not elsewhere classified: Secondary | ICD-10-CM | POA: Diagnosis not present

## 2018-07-16 DIAGNOSIS — R5383 Other fatigue: Secondary | ICD-10-CM | POA: Insufficient documentation

## 2018-07-16 DIAGNOSIS — D72829 Elevated white blood cell count, unspecified: Secondary | ICD-10-CM | POA: Diagnosis not present

## 2018-07-16 DIAGNOSIS — R110 Nausea: Secondary | ICD-10-CM | POA: Insufficient documentation

## 2018-07-16 DIAGNOSIS — D649 Anemia, unspecified: Secondary | ICD-10-CM | POA: Insufficient documentation

## 2018-07-16 DIAGNOSIS — C8590 Non-Hodgkin lymphoma, unspecified, unspecified site: Secondary | ICD-10-CM | POA: Diagnosis not present

## 2018-07-16 DIAGNOSIS — Z8701 Personal history of pneumonia (recurrent): Secondary | ICD-10-CM | POA: Diagnosis not present

## 2018-07-16 DIAGNOSIS — N183 Chronic kidney disease, stage 3 (moderate): Secondary | ICD-10-CM | POA: Diagnosis not present

## 2018-07-16 DIAGNOSIS — Z5189 Encounter for other specified aftercare: Secondary | ICD-10-CM | POA: Diagnosis not present

## 2018-07-16 DIAGNOSIS — Z7189 Other specified counseling: Secondary | ICD-10-CM

## 2018-07-16 LAB — CBC WITH DIFFERENTIAL (CANCER CENTER ONLY)
Abs Immature Granulocytes: 0.05 10*3/uL (ref 0.00–0.07)
Basophils Absolute: 0.1 10*3/uL (ref 0.0–0.1)
Basophils Relative: 1 %
Eosinophils Absolute: 0.2 10*3/uL (ref 0.0–0.5)
Eosinophils Relative: 4 %
HCT: 33.1 % — ABNORMAL LOW (ref 36.0–46.0)
Hemoglobin: 10.8 g/dL — ABNORMAL LOW (ref 12.0–15.0)
Immature Granulocytes: 1 %
Lymphocytes Relative: 22 %
Lymphs Abs: 1.2 10*3/uL (ref 0.7–4.0)
MCH: 26.8 pg (ref 26.0–34.0)
MCHC: 32.6 g/dL (ref 30.0–36.0)
MCV: 82.1 fL (ref 80.0–100.0)
Monocytes Absolute: 0.8 10*3/uL (ref 0.1–1.0)
Monocytes Relative: 14 %
Neutro Abs: 3.3 10*3/uL (ref 1.7–7.7)
Neutrophils Relative %: 58 %
Platelet Count: 171 10*3/uL (ref 150–400)
RBC: 4.03 MIL/uL (ref 3.87–5.11)
RDW: 15.8 % — ABNORMAL HIGH (ref 11.5–15.5)
WBC Count: 5.6 10*3/uL (ref 4.0–10.5)
nRBC: 0 % (ref 0.0–0.2)

## 2018-07-16 LAB — LACTATE DEHYDROGENASE: LDH: 170 U/L (ref 98–192)

## 2018-07-16 LAB — CMP (CANCER CENTER ONLY)
ALT: 15 U/L (ref 0–44)
AST: 19 U/L (ref 15–41)
Albumin: 4 g/dL (ref 3.5–5.0)
Alkaline Phosphatase: 75 U/L (ref 38–126)
Anion gap: 8 (ref 5–15)
BUN: 23 mg/dL (ref 8–23)
CO2: 28 mmol/L (ref 22–32)
Calcium: 10.4 mg/dL — ABNORMAL HIGH (ref 8.9–10.3)
Chloride: 101 mmol/L (ref 98–111)
Creatinine: 1.09 mg/dL — ABNORMAL HIGH (ref 0.44–1.00)
GFR, Est AFR Am: 56 mL/min — ABNORMAL LOW (ref >60)
GFR, Estimated: 48 mL/min — ABNORMAL LOW (ref >60)
Glucose, Bld: 102 mg/dL — ABNORMAL HIGH (ref 70–99)
Potassium: 4.3 mmol/L (ref 3.5–5.1)
Sodium: 137 mmol/L (ref 135–145)
Total Bilirubin: 0.4 mg/dL (ref 0.3–1.2)
Total Protein: 6 g/dL — ABNORMAL LOW (ref 6.5–8.1)

## 2018-07-16 MED ORDER — ALLOPURINOL 300 MG PO TABS: 300.0000 mg | ORAL_TABLET | Freq: Every day | ORAL | 3 refills | Status: DC

## 2018-07-16 MED ORDER — PREDNISONE 20 MG PO TABS: 40.0000 mg/m2 | ORAL_TABLET | Freq: Every day | ORAL | 5 refills | Status: AC

## 2018-07-16 MED ORDER — ONDANSETRON HCL 8 MG PO TABS: 8.0000 mg | ORAL_TABLET | Freq: Two times a day (BID) | ORAL | 1 refills | Status: DC | PRN

## 2018-07-16 MED ORDER — HEPARIN SOD (PORK) LOCK FLUSH 100 UNIT/ML IV SOLN
500.0000 [IU] | Freq: Once | INTRAVENOUS | Status: AC
  Administered 2018-07-16: 500 [IU] via INTRAVENOUS
  Filled 2018-07-16: qty 5

## 2018-07-16 MED ORDER — ACYCLOVIR 400 MG PO TABS: 400.0000 mg | ORAL_TABLET | Freq: Two times a day (BID) | ORAL | 5 refills | Status: AC

## 2018-07-16 MED ORDER — SODIUM CHLORIDE 0.9% FLUSH
10.0000 mL | Freq: Once | INTRAVENOUS | Status: AC
  Administered 2018-07-16: 10 mL via INTRAVENOUS
  Filled 2018-07-16: qty 10

## 2018-07-16 MED ORDER — LIDOCAINE-PRILOCAINE 2.5-2.5 % EX CREA: TOPICAL_CREAM | CUTANEOUS | 3 refills | Status: DC

## 2018-07-16 MED ORDER — PROCHLORPERAZINE MALEATE 10 MG PO TABS: 10.0000 mg | ORAL_TABLET | Freq: Four times a day (QID) | ORAL | 6 refills | Status: DC | PRN

## 2018-07-16 NOTE — Patient Instructions (Signed)

## 2018-07-16 NOTE — Telephone Encounter (Signed)
PT order and Speech therapy request faxed to [facility] and sent to scan.

## 2018-07-16 NOTE — Progress Notes (Unsigned)
Patient in chemotherapy education class by herself.  Discussed side effects of  Rituxan, Cytoxan, Adriamycin and Vincristine and Prednisone which include but are not limited to myelosuppression, decreased appetite, fatigue, fever, allergic or infusional reaction, mucositis, cardiac toxicity, cough, SOB, altered taste, nausea and vomiting, diarrhea, constipation, elevated LFTs myalgia and arthralgias, hair loss or thinning, rash, skin dryness, nail changes, peripheral neuropathy, discolored urine, delayed wound healing, mental changes (Chemo brain), increased risk of infections, weight loss.  Reviewed infusion room and office policy and procedure and phone numbers 24 hours x 7 days a week.  Reviewed when to call the office with any concerns or problems.  Scientist, clinical (histocompatibility and immunogenetics) given.  Discussed portacath insertion and EMLA cream administration.  Antiemetic protocol and chemotherapy schedule reviewed. Patient verbalized understanding of chemotherapy indications and possible side effects.  Teachback done

## 2018-07-16 NOTE — Progress Notes (Addendum)
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  -05/2018:   CT chest showed pathologic lymphadenopathy involving bilateral axilla, mediastinum as well as multiple small lung nodules and possible splenomegaly  PET showed FDG-avid lymphadenopathy in neck, chest, abdomen and pelvis, questionable involvement of the lung, and focal involvement of left scapula and acetabulum   Left axillary LN excisional bx: NHL, favoring low-grade Amanda zone lymphoma; Cyclin-D1 neg ([facility]) -06/2018: admitted for AKI secondary to hypercalcemia (Ca 13); LVEF normal  -07/2018 - present: R-mini-CHOP with Onpro  TREATMENT REGIMEN:  07/21/2018 - present: R-mini-CHOP with Onpro; plan for 6 cycles   ASSESSMENT & PLAN:   Stage IV nodal Amanda zone lymphoma  -I reviewed the records from [facility] extensively, including hematology clinic notes and lab studies -In addition, I have also had lengthy discussions with Dr. Jolayne Haines of hematology/oncology at [facility] regarding the patient's case -In summary, patient was seen at [facility] for a second opinion, and her lymph node biopsy was reviewed there as well.  Pathology confirmed nodal Amanda zone lymphoma.  Given that the FDG avidity of the LN's in the chest were higher than other areas, there is a concern for possible large cell transformation. However, the location of those LN's would be difficult to access and may require mediastinoscopy, which would be very invasive and potentially associated with significant risk of complications. The  consensus is to proceed with R-mini-CHOP, based on the tumor board evaluation at [facility].  -We discussed the role of chemotherapy. The goal is for palliative intent. -We discussed some of the risks, benefits and side-effects of R-mini-CHOP. Given her age, I have added Onpro to reduce the risk of neutropenic fever.  -Some of the short term side-effects included, though not limited to, risk of fatigue, weight loss, tumor lysis syndrome, risk of allergic reactions, pancytopenia, life-threatening infections, need for transfusions of blood products, nausea, vomiting, change in bowel habits, hair loss, risk of congestive heart failure, admission to hospital for various reasons, and risks of death.  -Long term side-effects are also discussed including permanent damage to nerve function, chronic fatigue, and rare secondary malignancy including bone marrow disorders.  -The patient is aware that the response rates discussed earlier is not guaranteed.   -After a long discussion, patient made an informed decision to proceed with the prescribed plan of care.  -Baseline LVEF normal; Hepatitis B panel negative -We will tentatively proceed with 1st dose of chemotherapy on 07/21/2018 -Prophylaxis: acyclovir, allopurinol -PRN anti-emetics: Zofran, Compazine   Normocytic anemia -Possibly due to bone marrow involvement by lymphoma -Hgb 10.8 today -Patient denies any symptoms of bleeding -We will check iron profile at the next visit -Continue to monitor it for now   Goals of care discussion -Patient understands that the goal of treatment is for palliative intent, given the overall indolent nature of Amanda zone lymphoma  Orders Placed This Encounter  Procedures  . CBC with Differential (Cancer Center Only)    Standing Status:   Standing    Number of Occurrences:   20    Standing Expiration Date:   07/16/2019  . CMP ([place] only)  Standing Status:   Standing    Number of Occurrences:   20     Standing Expiration Date:   07/16/2019  . Magnesium    Standing Status:   Standing    Number of Occurrences:   20    Standing Expiration Date:   07/16/2019  . PHYSICIAN COMMUNICATION ORDER    A baseline Echo/ [place] should be obtained prior to initiation of Anthracycline Chemotherapy  . PHYSICIAN COMMUNICATION ORDER    Hepatitis B Virus screening with HBsAg and anti-HBc recommended prior to treatment with rituximab (Rituxan), ofatumumab (Arzerra) or obinutuzumab Dyann Kief).   All questions were answered. The patient knows to call the clinic with any problems, questions or concerns. No barriers to learning was detected.  A total of more than 40 minutes were spent face-to-face with the patient during this encounter and over half of that time was spent on counseling and coordination of care as outlined above.   Return on 07/30/2018 for toxicity check prior to Cycle 2 of chemotherapy on 08/11/2018.   Tish Men, MD 07/16/2018 1:31 PM  CHIEF COMPLAINT: "I am just a little tired"  INTERVAL HISTORY: Amanda [name] returns to clinic for follow-up of Stage IV Amanda zone lymphoma.  She was recently seen in the [facility] for second opinion, and the consensus recommendation was to proceed with R-mini-CHOP.  Patient reports that she has some stable fatigue, but her appetite is good.  She lost about 10 pounds over the past several month, but she is now able to gain some of the weight back.  She was discharged home with amlodipine 5 mg daily after recent hospitalization and has been checking her blood pressure at home, which has been ranging in the 299'M systolics.  She otherwise denies any other new complaint today.  SUMMARY OF ONCOLOGIC HISTORY:   Amanda zone lymphoma ([place])   05/21/2018 Initial Diagnosis    Amanda zone lymphoma ([place])    07/21/2018 -  Chemotherapy    The patient had DOXOrubicin (ADRIAMYCIN) chemo injection 42 mg, 25 mg/m2 = 42 mg (100 % of original dose 25 mg/m2), Intravenous,  Once, 0 of 6  cycles Dose modification: 25 mg/m2 (original dose 25 mg/m2, Cycle 1, Reason: Patient Age) palonosetron (ALOXI) injection 0.25 mg, 0.25 mg, Intravenous,  Once, 0 of 6 cycles pegfilgrastim (NEULASTA ONPRO KIT) injection 6 mg, 6 mg, Subcutaneous, Once, 0 of 6 cycles vinCRIStine (ONCOVIN) 1 mg in sodium chloride 0.9 % 50 mL chemo infusion, 1 mg (100 % of original dose 1 mg), Intravenous,  Once, 0 of 6 cycles Dose modification: 1 mg (original dose 1 mg, Cycle 1, Reason: Patient Age) riTUXimab (RITUXAN) 600 mg in sodium chloride 0.9 % 250 mL (1.9355 mg/mL) infusion, 375 mg/m2 = 600 mg, Intravenous,  Once, 0 of 6 cycles cyclophosphamide (CYTOXAN) 660 mg in sodium chloride 0.9 % 250 mL chemo infusion, 400 mg/m2 = 660 mg (100 % of original dose 400 mg/m2), Intravenous,  Once, 0 of 6 cycles Dose modification: 400 mg/m2 (original dose 400 mg/m2, Cycle 1, Reason: Patient Age)  for chemotherapy treatment.      REVIEW OF SYSTEMS:   Constitutional: ( - ) fevers, ( - )  chills , ( - ) night sweats Eyes: ( - ) blurriness of vision, ( - ) double vision, ( - ) watery eyes Ears, nose, mouth, throat, and face: ( - ) mucositis, ( - ) sore throat Respiratory: ( - ) cough, ( - ) dyspnea, ( - ) wheezes Cardiovascular: ( - )  palpitation, ( - ) chest discomfort, ( - ) lower extremity swelling Gastrointestinal:  ( - ) nausea, ( - ) heartburn, ( - ) change in bowel habits Skin: ( - ) abnormal skin rashes Lymphatics: ( + ) lymphadenopathy, ( - ) easy bruising Neurological: ( - ) numbness, ( - ) tingling, ( - ) new weaknesses Behavioral/Psych: ( - ) mood change, ( - ) new changes  All other systems were reviewed with the patient and are negative.  I have reviewed the past medical history, past surgical history, social history and family history with the patient and they are unchanged from previous note.  ALLERGIES:  is allergic to phenergan [promethazine hcl]; levofloxacin; and pravastatin sodium.  MEDICATIONS:   Current Outpatient Medications  Medication Sig Dispense Refill  . acetaminophen (TYLENOL) 500 MG tablet Take 1,000 mg by mouth every 6 (six) hours as needed for mild pain.    [name] albuterol (PROVENTIL) (2.5 MG/3ML) 0.083% nebulizer solution Take 3 mLs (2.5 mg total) by nebulization every 6 (six) hours as needed for wheezing or shortness of breath. 75 mL 12  . amLODipine (NORVASC) 5 MG tablet Take 1 tablet (5 mg total) by mouth daily with breakfast. 90 tablet 1  . buPROPion (WELLBUTRIN XL) 150 MG 24 hr tablet TAKE 1 TABLET BY MOUTH EVERY DAY 90 tablet 1  . famotidine (PEPCID) 20 MG tablet One at bedtime (Patient taking differently: Take 20 mg by mouth at bedtime. One at bedtime ) 30 tablet 11  . feeding supplement, ENSURE ENLIVE, (ENSURE ENLIVE) LIQD Take 237 mLs by mouth 2 (two) times daily between meals. 60 Bottle 0  . levothyroxine (SYNTHROID, LEVOTHROID) 125 MCG tablet TAKE 1 TABLET BY MOUTH DAILY 90 tablet 0  . magic mouthwash w/lidocaine SOLN Take 5 mLs by mouth 4 (four) times daily as needed for mouth pain. Swish and Spit 240 mL 0  . Omega-3 Fatty Acids (OMEGA 3 PO) Take by mouth.    [name] omeprazole (PRILOSEC) 20 MG capsule Take 2 x 30 min before breakfast (Patient taking differently: Take 20 mg by mouth daily. )    . acyclovir (ZOVIRAX) 400 MG tablet Take 1 tablet (400 mg total) by mouth 2 (two) times daily for 30 days. 60 tablet 5  . albuterol (PROVENTIL HFA;VENTOLIN HFA) 108 (90 Base) MCG/ACT inhaler Inhale 2 puffs into the lungs every 6 (six) hours as needed for up to 30 days for wheezing or shortness of breath. 1 Inhaler 2  . allopurinol (ZYLOPRIM) 300 MG tablet Take 1 tablet (300 mg total) by mouth daily. 30 tablet 3  . lidocaine-prilocaine (EMLA) cream Apply to affected area once 30 g 3  . ondansetron (ZOFRAN) 8 MG tablet Take 1 tablet (8 mg total) by mouth 2 (two) times daily as needed for refractory nausea / vomiting. Start on day 3 after cyclophosphamide chemotherapy. 30 tablet 1  .  potassium chloride 20 MEQ TBCR Take 20 mEq by mouth daily for 3 days. 3 tablet 0  . predniSONE (DELTASONE) 20 MG tablet Take 3.5 tablets (70 mg total) by mouth daily for 5 days. Take on days 1-5 of chemotherapy. 18 tablet 5  . prochlorperazine (COMPAZINE) 10 MG tablet Take 1 tablet (10 mg total) by mouth every 6 (six) hours as needed (Nausea or vomiting). 30 tablet 6   No current facility-administered medications for this visit.     PHYSICAL EXAMINATION: ECOG PERFORMANCE STATUS: 1 - Symptomatic but completely ambulatory  Today's Vitals   07/16/18 1257  BP:  127/70  Pulse: 77  Resp: 19  SpO2: 100%  Weight: 135 lb (61.2 kg)  Height: 5' 1" (1.549 m)  PainSc: 0-No pain   Body mass index is 25.51 kg/m.  Filed Weights   07/16/18 1257  Weight: 135 lb (61.2 kg)    GENERAL: alert, no distress and comfortable SKIN: skin color, texture, turgor are normal, no rashes or significant lesions EYES: conjunctiva are pink and non-injected, sclera clear OROPHARYNX: no exudate, no erythema; lips, buccal mucosa, and tongue normal  NECK: supple, non-tender LYMPH:  mild cervical lymphadenopathy, L > R  LUNGS: clear to auscultation with normal breathing effort HEART: regular rate & rhythm and no murmurs and no lower extremity edema ABDOMEN: soft, non-tender, non-distended, normal bowel sounds Musculoskeletal: no cyanosis of digits and no clubbing  PSYCH: alert & oriented x 3, fluent speech NEURO: no focal motor/sensory deficits  LABORATORY DATA:  I have reviewed the data as listed    Component Value Date/Time   NA 137 07/16/2018 1210   NA 129 (A) 06/23/2018   K 4.3 07/16/2018 1210   CL 101 07/16/2018 1210   CO2 28 07/16/2018 1210   GLUCOSE 102 (H) 07/16/2018 1210   BUN 23 07/16/2018 1210   BUN 14 06/23/2018   CREATININE 1.09 (H) 07/16/2018 1210   CREATININE 1.36 (H) 10/27/2015 1530   CALCIUM 10.4 (H) 07/16/2018 1210   CALCIUM 12.3 (H) 06/16/2018 1639   PROT 6.0 (L) 07/16/2018 1210    ALBUMIN 4.0 07/16/2018 1210   AST 19 07/16/2018 1210   ALT 15 07/16/2018 1210   ALKPHOS 75 07/16/2018 1210   BILITOT 0.4 07/16/2018 1210   GFRNONAA 48 (L) 07/16/2018 1210   GFRAA 56 (L) 07/16/2018 1210    No results found for: SPEP, UPEP  Lab Results  Component Value Date   WBC 5.6 07/16/2018   NEUTROABS 3.3 07/16/2018   HGB 10.8 (L) 07/16/2018   HCT 33.1 (L) 07/16/2018   MCV 82.1 07/16/2018   PLT 171 07/16/2018      Chemistry      Component Value Date/Time   NA 137 07/16/2018 1210   NA 129 (A) 06/23/2018   K 4.3 07/16/2018 1210   CL 101 07/16/2018 1210   CO2 28 07/16/2018 1210   BUN 23 07/16/2018 1210   BUN 14 06/23/2018   CREATININE 1.09 (H) 07/16/2018 1210   CREATININE 1.36 (H) 10/27/2015 1530   GLU 91 06/23/2018      Component Value Date/Time   CALCIUM 10.4 (H) 07/16/2018 1210   CALCIUM 12.3 (H) 06/16/2018 1639   ALKPHOS 75 07/16/2018 1210   AST 19 07/16/2018 1210   ALT 15 07/16/2018 1210   BILITOT 0.4 07/16/2018 1210       RADIOGRAPHIC STUDIES: I have personally reviewed the radiological images as listed below and agreed with the findings in the report. Dg Chest 2 View  Result Date: 07/01/2018 CLINICAL DATA:  Shortness of breath and cough EXAM: CHEST - 2 VIEW COMPARISON:  June 30, 2018 FINDINGS: There are pleural effusions bilaterally, larger on the left than on the right, stable. There is atelectatic change in the bases with patchy airspace disease in the bases as well, stable. No new opacity evident. Heart is upper normal in size with pulmonary vascularity normal. No adenopathy. No bone lesions. IMPRESSION: Bilateral pleural effusions, larger on the left than on the right, with bibasilar atelectasis and patchy airspace opacity, essentially stable. Stable heart upper normal in size. No adenopathy evident. Electronically Signed  By: Lowella Grip III M.D.   On: 07/01/2018 08:03   Dg Chest 2 View  Result Date: 06/30/2018 CLINICAL DATA:  SHORTNESS OF  BREATH.  COUGH.  PLEURAL EFFUSIONS. EXAM: CHEST - 2 VIEW COMPARISON:  TWO-VIEW CHEST X-RAY 06/27/2018 FINDINGS: Heart is enlarged. Left greater than right pleural effusions and basilar airspace disease are again seen. Aeration is improving. Mild pulmonary vascular congestion is present as well. IMPRESSION: 1. Improving aeration of both lungs. 2. Persistent pleural effusions and airspace disease, left greater than right. This remains concerning for infection. Electronically Signed   By: San Morelle M.D.   On: 06/30/2018 08:52   Ct Angio Chest Pe W And/or Wo Contrast  Result Date: 06/26/2018 CLINICAL DATA:  Shortness of breath with cough and elevated D-dimer levels. Symptoms for several weeks. Recent diagnosis of lymphoma. EXAM: CT ANGIOGRAPHY CHEST WITH CONTRAST TECHNIQUE: Multidetector CT imaging of the chest was performed using the standard protocol during bolus administration of intravenous contrast. Multiplanar CT image reconstructions and MIPs were obtained to evaluate the vascular anatomy. CONTRAST:  3m OMNIPAQUE IOHEXOL 350 MG/ML SOLN COMPARISON:  Radiographs today, PET-CT 05/29/2018 and chest CT 05/19/2018. FINDINGS: Cardiovascular: The pulmonary arteries are well opacified with contrast to the level of the subsegmental branches. There is no evidence of acute pulmonary embolism. Stable mild atherosclerosis of the aorta, great vessels and coronary arteries. The heart is mildly enlarged. No significant pericardial effusion. Mediastinum/Nodes: Again demonstrated is extensive supraclavicular, mediastinal, hilar and axillary lymphadenopathy bilaterally. Some of these nodes have enlarged compared with the previous study, including a 1.7 cm right axillary node on image 27/6 and a 1.9 cm left axillary node on image 40/6. 2.3 cm AP window node on image 30/6 and 2.5 cm subcarinal node on image 50/6 are similar to the previous study. The thyroid gland, trachea and esophagus demonstrate no significant  findings. Lungs/Pleura: There are enlarging, moderate size dependent bilateral pleural effusions. There is worsening aeration of the lungs. Chronic collapse and bronchiectasis in the right middle lobe and lingula have progressed. There is new partial collapse and opacification of both lower lobes. Underlying nodularity within the aerated portions of the lungs is grossly stable. Upper abdomen: There are multiple mildly enlarged lymph nodes within the porta hepatis and upper retroperitoneum. Stable mild splenomegaly without focal abnormality. There is mild reflux of contrast into the hepatic vein and IVC. Musculoskeletal/Chest wall: There is no chest wall mass or suspicious osseous finding. Review of the MIP images confirms the above findings. IMPRESSION: 1. No evidence of acute pulmonary embolism. 2. Extensive thoracic adenopathy, mildly progressive from previous CT of last month, consistent with slight worsening of lymphoma. Splenomegaly and upper abdominal adenopathy are grossly stable. 3. Enlarging bilateral pleural effusions with worsening airspace opacities in both lower lobes, the right middle lobe and lingula. Underlying pulmonary nodularity is grossly stable. Pattern is nonspecific and could be secondary to superimposed atelectasis, infection or edema. Electronically Signed   By: WRichardean SaleM.D.   On: 06/26/2018 17:37   Dg Chest Port 1 View  Result Date: 06/27/2018 CLINICAL DATA:  Current history of lymphoma with shortness of breath and cough. Follow-up effusions and possible pneumonia. EXAM: PORTABLE CHEST 1 VIEW COMPARISON:  CTA chest yesterday. Chest x-rays yesterday and earlier. FINDINGS: Cardiac silhouette mildly enlarged, unchanged. Thoracic aorta ectatic and atherosclerotic, unchanged. Since yesterday, no change in the moderately large BILATERAL pleural effusions, LEFT greater than RIGHT, and the airspace consolidation in the BILATERAL LOWER LOBES, RIGHT MIDDLE LOBE and lingula. Pulmonary  vascularity normal. 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 IMPRESSION: 1. Stable moderately large BILATERAL pleural effusions, LEFT greater than RIGHT, and associated passive atelectasis and/or pneumonia in the BILATERAL LOWER LOBES, RIGHT MIDDLE LOBE and lingula. 2. Interstitial opacities throughout both lungs, unchanged since yesterday's examinations but new since 06/16/2018, query lymphangitic carcinomatosis. Interstitial edema is felt less likely since pulmonary vascularity is normal. Electronically Signed   By: Evangeline Dakin M.D.   On: 06/27/2018 19:11   Dg Chest Port 1 View  Result Date: 06/26/2018 CLINICAL DATA:  Cough, shortness of breath EXAM: PORTABLE CHEST 1 VIEW COMPARISON:  06/16/2018 FINDINGS: Bilateral mild interstitial thickening increased from the prior exam. Bilateral small pleural effusions. No pneumothorax. Stable cardiomediastinal silhouette. No acute osseous abnormality. IMPRESSION: 1. Bilateral interstitial thickening primarily at the lung bases and small pleural effusions. Differential considerations include pulmonary edema versus pneumonia. Electronically Signed   By: Kathreen Devoid   On: 06/26/2018 12:45   Ir Imaging Guided Port Insertion  Result Date: 07/15/2018 INDICATION: 78 year old with Amanda zone lymphoma. Port-A-Cath needed for therapy. EXAM: FLUOROSCOPIC AND ULTRASOUND GUIDED PLACEMENT OF A SUBCUTANEOUS PORT COMPARISON:  None. MEDICATIONS: Ancef 2 g; The antibiotic was administered within an appropriate time interval prior to skin puncture. ANESTHESIA/SEDATION: Versed 4.0 mg IV; Fentanyl 100 mcg IV; Moderate Sedation Time:  39 minutes The patient was continuously monitored during the procedure by the interventional radiology nurse under my direct supervision.  FLUOROSCOPY TIME:  42 seconds, 4 mGy COMPLICATIONS: None immediate. PROCEDURE: The procedure, risks, benefits, and alternatives were explained to the patient. Questions regarding the procedure were encouraged and answered. The patient understands and consents to the procedure. Patient was placed supine on the interventional table. Ultrasound confirmed a patent right internal jugular vein. The right chest and neck were cleaned with a skin antiseptic and a sterile drape was placed. Maximal barrier sterile technique was utilized including caps, mask, sterile gowns, sterile gloves, sterile drape, hand hygiene and skin antiseptic. The right neck was anesthetized with 1% lidocaine. Small incision was made in the right neck with a blade. Micropuncture set was placed in the right internal jugular vein with ultrasound guidance. The micropuncture wire was used for measurement purposes. The right chest was anesthetized with 1% lidocaine with epinephrine. #15 blade was used to make an incision and a subcutaneous port pocket was formed. [place] was assembled. Subcutaneous tunnel was formed with a stiff tunneling device. The port catheter was brought through the subcutaneous tunnel. The port was placed in the subcutaneous pocket and sutured in place. The micropuncture set was exchanged for a peel-away sheath. The catheter was placed through the peel-away sheath and the tip was positioned at the SVC and right atrium junction. Catheter placement was confirmed with fluoroscopy. The port was accessed and flushed with heparinized saline. The port pocket was closed using two layers of absorbable sutures and Dermabond. The vein skin site was closed using a single layer of absorbable suture and Dermabond. Sterile dressings were applied. Patient tolerated the procedure well without an immediate complication. Ultrasound and fluoroscopic images were taken and saved for this procedure. IMPRESSION: Placement of a subcutaneous CT  injectable port device. Catheter tip at the SVC and right atrium junction. Electronically Signed   By: Markus Daft M.D.   On: 07/15/2018 14:31

## 2018-07-17 ENCOUNTER — Telehealth: Payer: Self-pay | Admitting: Hematology

## 2018-07-17 NOTE — Telephone Encounter (Signed)
Appts scheduled letter/calendar mailed

## 2018-07-20 DIAGNOSIS — Z5181 Encounter for therapeutic drug level monitoring: Secondary | ICD-10-CM | POA: Diagnosis not present

## 2018-07-20 DIAGNOSIS — Z8701 Personal history of pneumonia (recurrent): Secondary | ICD-10-CM | POA: Diagnosis not present

## 2018-07-20 DIAGNOSIS — I129 Hypertensive chronic kidney disease with stage 1 through stage 4 chronic kidney disease, or unspecified chronic kidney disease: Secondary | ICD-10-CM | POA: Diagnosis not present

## 2018-07-20 DIAGNOSIS — N183 Chronic kidney disease, stage 3 (moderate): Secondary | ICD-10-CM | POA: Diagnosis not present

## 2018-07-20 DIAGNOSIS — C8590 Non-Hodgkin lymphoma, unspecified, unspecified site: Secondary | ICD-10-CM | POA: Diagnosis not present

## 2018-07-21 ENCOUNTER — Encounter: Payer: Self-pay | Admitting: *Deleted

## 2018-07-21 ENCOUNTER — Other Ambulatory Visit: Payer: Self-pay | Admitting: Hematology

## 2018-07-21 ENCOUNTER — Inpatient Hospital Stay: Payer: Medicare Other

## 2018-07-21 ENCOUNTER — Other Ambulatory Visit: Payer: Self-pay

## 2018-07-21 VITALS — BP 131/55 | HR 90 | Temp 98.3°F | Resp 18

## 2018-07-21 DIAGNOSIS — R5383 Other fatigue: Secondary | ICD-10-CM | POA: Diagnosis not present

## 2018-07-21 DIAGNOSIS — R110 Nausea: Secondary | ICD-10-CM | POA: Diagnosis not present

## 2018-07-21 DIAGNOSIS — D72829 Elevated white blood cell count, unspecified: Secondary | ICD-10-CM | POA: Diagnosis not present

## 2018-07-21 DIAGNOSIS — D649 Anemia, unspecified: Secondary | ICD-10-CM | POA: Diagnosis not present

## 2018-07-21 DIAGNOSIS — C8580 Other specified types of non-Hodgkin lymphoma, unspecified site: Secondary | ICD-10-CM

## 2018-07-21 DIAGNOSIS — T451X5A Adverse effect of antineoplastic and immunosuppressive drugs, initial encounter: Secondary | ICD-10-CM | POA: Diagnosis not present

## 2018-07-21 MED ORDER — DOXORUBICIN HCL CHEMO IV INJECTION 2 MG/ML
25.0000 mg/m2 | Freq: Once | INTRAVENOUS | Status: AC
  Administered 2018-07-21: 09:00:00 42 mg via INTRAVENOUS
  Filled 2018-07-21: qty 21

## 2018-07-21 MED ORDER — ACETAMINOPHEN 325 MG PO TABS
650.0000 mg | ORAL_TABLET | Freq: Once | ORAL | Status: AC
  Administered 2018-07-21: 09:00:00 650 mg via ORAL

## 2018-07-21 MED ORDER — PALONOSETRON HCL INJECTION 0.25 MG/5ML
0.2500 mg | Freq: Once | INTRAVENOUS | Status: AC
  Administered 2018-07-21: 09:00:00 0.25 mg via INTRAVENOUS

## 2018-07-21 MED ORDER — SODIUM CHLORIDE 0.9 % IV SOLN
Freq: Once | INTRAVENOUS | Status: AC
  Administered 2018-07-21: 09:00:00 via INTRAVENOUS
  Filled 2018-07-21: qty 250

## 2018-07-21 MED ORDER — SODIUM CHLORIDE 0.9% FLUSH
10.0000 mL | INTRAVENOUS | Status: DC | PRN
  Administered 2018-07-21: 15:00:00 10 mL
  Filled 2018-07-21: qty 10

## 2018-07-21 MED ORDER — DIPHENHYDRAMINE HCL 25 MG PO CAPS
50.0000 mg | ORAL_CAPSULE | Freq: Once | ORAL | Status: AC
  Administered 2018-07-21: 50 mg via ORAL

## 2018-07-21 MED ORDER — DEXAMETHASONE SODIUM PHOSPHATE 10 MG/ML IJ SOLN
INTRAMUSCULAR | Status: AC
  Filled 2018-07-21: qty 1

## 2018-07-21 MED ORDER — FAMOTIDINE IN NACL 20-0.9 MG/50ML-% IV SOLN
20.0000 mg | Freq: Once | INTRAVENOUS | Status: AC
  Administered 2018-07-21: 11:00:00 20 mg via INTRAVENOUS

## 2018-07-21 MED ORDER — DIPHENHYDRAMINE HCL 25 MG PO CAPS
ORAL_CAPSULE | ORAL | Status: AC
  Filled 2018-07-21: qty 2

## 2018-07-21 MED ORDER — PEGFILGRASTIM 6 MG/0.6ML ~~LOC~~ PSKT
6.0000 mg | PREFILLED_SYRINGE | Freq: Once | SUBCUTANEOUS | Status: AC
  Administered 2018-07-21: 15:00:00 6 mg via SUBCUTANEOUS

## 2018-07-21 MED ORDER — HEPARIN SOD (PORK) LOCK FLUSH 100 UNIT/ML IV SOLN
500.0000 [IU] | Freq: Once | INTRAVENOUS | Status: AC | PRN
  Administered 2018-07-21: 15:00:00 500 [IU]
  Filled 2018-07-21: qty 5

## 2018-07-21 MED ORDER — SODIUM CHLORIDE 0.9 % IV SOLN
400.0000 mg/m2 | Freq: Once | INTRAVENOUS | Status: AC
  Administered 2018-07-21: 10:00:00 660 mg via INTRAVENOUS
  Filled 2018-07-21: qty 33

## 2018-07-21 MED ORDER — PEGFILGRASTIM 6 MG/0.6ML ~~LOC~~ PSKT
PREFILLED_SYRINGE | SUBCUTANEOUS | Status: AC
  Filled 2018-07-21: qty 0.6

## 2018-07-21 MED ORDER — SODIUM CHLORIDE 0.9 % IV SOLN
375.0000 mg/m2 | Freq: Once | INTRAVENOUS | Status: AC
  Administered 2018-07-21: 11:00:00 600 mg via INTRAVENOUS
  Filled 2018-07-21: qty 50

## 2018-07-21 MED ORDER — VINCRISTINE SULFATE CHEMO INJECTION 1 MG/ML
1.0000 mg | Freq: Once | INTRAVENOUS | Status: AC
  Administered 2018-07-21: 10:00:00 1 mg via INTRAVENOUS
  Filled 2018-07-21: qty 1

## 2018-07-21 MED ORDER — FAMOTIDINE IN NACL 20-0.9 MG/50ML-% IV SOLN
INTRAVENOUS | Status: AC
  Filled 2018-07-21: qty 50

## 2018-07-21 MED ORDER — PALONOSETRON HCL INJECTION 0.25 MG/5ML
INTRAVENOUS | Status: AC
  Filled 2018-07-21: qty 5

## 2018-07-21 MED ORDER — ACETAMINOPHEN 325 MG PO TABS
ORAL_TABLET | ORAL | Status: AC
  Filled 2018-07-21: qty 2

## 2018-07-21 MED ORDER — DEXAMETHASONE SODIUM PHOSPHATE 10 MG/ML IJ SOLN
10.0000 mg | Freq: Once | INTRAMUSCULAR | Status: AC
  Administered 2018-07-21: 09:00:00 10 mg via INTRAVENOUS

## 2018-07-21 NOTE — Patient Instructions (Addendum)
[facility] Discharge Instructions for Patients Receiving Chemotherapy  Today you received the following chemotherapy agents Rituxan, Cytoxan, Adriamycin, Vincristine To help prevent nausea and vomiting after your treatment, we encourage you to take your nausea medication  1) Tonight, Wednesday, Thursday, you can take Prochlorperazine (Compazine) every 6 hours as Needed for nausea. 2) Beginning Friday, if you are experiencing nausea you can take Ondansetron (Zofran) twice daily as needed for nausea.   3   If you develop nausea and vomiting that is not controlled by your nausea medication, call the clinic.   BELOW ARE SYMPTOMS THAT SHOULD BE REPORTED IMMEDIATELY:  *FEVER GREATER THAN 100.5 F  *CHILLS WITH OR WITHOUT FEVER  NAUSEA AND VOMITING THAT IS NOT CONTROLLED WITH YOUR NAUSEA MEDICATION  *UNUSUAL SHORTNESS OF BREATH  *UNUSUAL BRUISING OR BLEEDING  TENDERNESS IN MOUTH AND THROAT WITH OR WITHOUT PRESENCE OF ULCERS  *URINARY PROBLEMS  *BOWEL PROBLEMS  UNUSUAL RASH Items with * indicate a potential emergency and should be followed up as soon as possible.  Feel free to call the clinic should you have any questions or concerns. The clinic phone number is [phone].  Please show the [place] at check-in to the Emergency Department and triage nurse.

## 2018-07-23 DIAGNOSIS — C8590 Non-Hodgkin lymphoma, unspecified, unspecified site: Secondary | ICD-10-CM | POA: Diagnosis not present

## 2018-07-23 DIAGNOSIS — Z5181 Encounter for therapeutic drug level monitoring: Secondary | ICD-10-CM | POA: Diagnosis not present

## 2018-07-23 DIAGNOSIS — Z8701 Personal history of pneumonia (recurrent): Secondary | ICD-10-CM | POA: Diagnosis not present

## 2018-07-23 DIAGNOSIS — N183 Chronic kidney disease, stage 3 (moderate): Secondary | ICD-10-CM | POA: Diagnosis not present

## 2018-07-23 DIAGNOSIS — R1310 Dysphagia, unspecified: Secondary | ICD-10-CM | POA: Diagnosis not present

## 2018-07-23 DIAGNOSIS — I129 Hypertensive chronic kidney disease with stage 1 through stage 4 chronic kidney disease, or unspecified chronic kidney disease: Secondary | ICD-10-CM | POA: Diagnosis not present

## 2018-07-28 ENCOUNTER — Other Ambulatory Visit: Payer: Self-pay | Admitting: Hematology

## 2018-07-28 ENCOUNTER — Encounter: Payer: Self-pay | Admitting: *Deleted

## 2018-07-28 ENCOUNTER — Telehealth: Payer: Self-pay | Admitting: Family Medicine

## 2018-07-28 DIAGNOSIS — C8580 Other specified types of non-Hodgkin lymphoma, unspecified site: Secondary | ICD-10-CM

## 2018-07-28 NOTE — Progress Notes (Signed)
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  -05/2018:   CT chest showed pathologic lymphadenopathy involving bilateral axilla, mediastinum as well as multiple small lung nodules and possible splenomegaly  PET showed FDG-avid lymphadenopathy in neck, chest, abdomen and pelvis, questionable involvement of the lung, and focal involvement of left scapula and acetabulum   Left axillary LN excisional bx: NHL, favoring low-grade marginal zone lymphoma; Cyclin-D1 neg ([facility]) -06/2018: admitted for AKI secondary to hypercalcemia (Ca 13); LVEF normal  -07/2018 - present: mini-R-CHOP with Onpro  TREATMENT REGIMEN:  07/21/2018 - present: mini-R-CHOP with Onpro; plan for 6 cycles   ASSESSMENT & PLAN:   Stage IV nodal marginal zone lymphoma  -S/p 1 cycle of mini-R-CHOP  -Patient tolerated the treatment relatively well without significant side effects -Cycle of chemotherapy scheduled on 08/11/2018 -We will plan to repeat PET after 3 cycles of treatment to assess interim response  -Prophylaxis: acyclovir, allopurinol -PRN anti-emetics: Zofran and Compazine   Normocytic anemia -Possibly due to bone marrow involvement by lymphoma and chemothrapy  -Hgb 9.9 today, slightly lower -Patient denies any symptoms of bleeding  -I have ordered iron profile, including soluble transferrin; results pending -We will monitor it for now   Leukocytosis -WBC 13.9 today, new -Likely secondary to G-CSF -Patient denies any symptoms of infection -We will monitor it for now    Chemotherapy-associated nausea  -Secondary to chemotherapy -Symptoms relatively well controlled  -Continue PRN-anti-emetics   No orders of the defined types were placed in this encounter.  All questions were answered. The patient knows to call the clinic with any problems, questions or concerns. No barriers to learning was detected.  Return on 08/11/2018 for labs, port flush, clinic appt and Cycle 2 of mini-R-CHOP.  Tish Men, MD 07/30/2018 3:26 PM  CHIEF COMPLAINT: "I am feeling much better"  INTERVAL HISTORY: Amanda [name] returns to clinic for follow-up of marginal zone lymphoma after cycle 1 of chemotherapy.  Patient reports that she tolerated the treatment well, except mild intermittent nausea, for which she took a few doses of Zofran with resolution of symptoms.  She did not have any vomiting.  She did have very mild generalized bony achiness, likely due to G-CSF, but the discomfort was tolerable and she did not have to take any medication.  She feels that her lymph nodes in her neck has gone down in size since the first treatment.  Her son-in-law passed away over the weekend, as she has been in [place], [place] with her daughter.  She denies any other complaint today.  SUMMARY OF ONCOLOGIC HISTORY:   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[place])   05/21/2018 Initial Diagnosis    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[place])    07/21/2018 -  Chemotherapy    The patient had DOXOrubicin (ADRIAMYCIN) chemo injection 42 mg, 25 mg/m2 = 42 mg (100 % of original dose 25 mg/m2), Intravenous,  Once, 1 of 6 cycles Dose modification: 25 mg/m2 (original dose 25 mg/m2, Cycle 1, Reason: Patient Age) Administration: 42 mg (07/21/2018) palonosetron (ALOXI) injection 0.25 mg, 0.25 mg, Intravenous,  Once, 1 of 6 cycles Administration: 0.25 mg (07/21/2018) pegfilgrastim (NEULASTA ONPRO KIT) injection 6  mg, 6 mg, Subcutaneous, Once, 1 of 6 cycles Administration: 6 mg (07/21/2018) vinCRIStine (ONCOVIN) 1 mg in sodium chloride 0.9 % 50  mL chemo infusion, 1 mg (100 % of original dose 1 mg), Intravenous,  Once, 1 of 6 cycles Dose modification: 1 mg (original dose 1 mg, Cycle 1, Reason: Patient Age) Administration: 1 mg (07/21/2018) riTUXimab (RITUXAN) 600 mg in sodium chloride 0.9 % 250 mL (1.9355 mg/mL) infusion, 375 mg/m2 = 600 mg, Intravenous,  Once, 1 of 6 cycles Administration: 600 mg (07/21/2018) cyclophosphamide (CYTOXAN) 660 mg in sodium chloride 0.9 % 250 mL chemo infusion, 400 mg/m2 = 660 mg (100 % of original dose 400 mg/m2), Intravenous,  Once, 1 of 6 cycles Dose modification: 400 mg/m2 (original dose 400 mg/m2, Cycle 1, Reason: Patient Age) Administration: 660 mg (07/21/2018)  for chemotherapy treatment.      REVIEW OF SYSTEMS:   Constitutional: ( - ) fevers, ( - )  chills , ( - ) night sweats Eyes: ( - ) blurriness of vision, ( - ) double vision, ( - ) watery eyes Ears, nose, mouth, throat, and face: ( - ) mucositis, ( - ) sore throat Respiratory: ( - ) cough, ( - ) dyspnea, ( - ) wheezes Cardiovascular: ( - ) palpitation, ( - ) chest discomfort, ( - ) lower extremity swelling Gastrointestinal:  ( + ) nausea, ( - ) heartburn, ( - ) change in bowel habits Skin: ( - ) abnormal skin rashes Lymphatics: ( - ) new lymphadenopathy, ( - ) easy bruising Neurological: ( - ) numbness, ( - ) tingling, ( - ) new weaknesses Behavioral/Psych: ( - ) mood change, ( - ) new changes  All other systems were reviewed with the patient and are negative.  I have reviewed the past medical history, past surgical history, social history and family history with the patient and they are unchanged from previous note.  ALLERGIES:  is allergic to phenergan [promethazine hcl]; levofloxacin; and pravastatin sodium.  MEDICATIONS:  Current Outpatient Medications  Medication Sig Dispense Refill  . acetaminophen (TYLENOL) 500 MG tablet Take 1,000 mg by mouth every 6 (six) hours as needed for mild pain.    [name] acyclovir (ZOVIRAX) 400 MG tablet  Take 1 tablet (400 mg total) by mouth 2 (two) times daily for 30 days. 60 tablet 5  . albuterol (PROVENTIL) (2.5 MG/3ML) 0.083% nebulizer solution Take 3 mLs (2.5 mg total) by nebulization every 6 (six) hours as needed for wheezing or shortness of breath. 75 mL 12  . allopurinol (ZYLOPRIM) 300 MG tablet Take 1 tablet (300 mg total) by mouth daily. 30 tablet 3  . amLODipine (NORVASC) 5 MG tablet Take 1 tablet (5 mg total) by mouth daily with breakfast. 90 tablet 1  . buPROPion (WELLBUTRIN XL) 150 MG 24 hr tablet TAKE 1 TABLET BY MOUTH EVERY DAY 90 tablet 1  . famotidine (PEPCID) 20 MG tablet One at bedtime (Patient taking differently: Take 20 mg by mouth at bedtime. One at bedtime ) 30 tablet 11  . feeding supplement, ENSURE ENLIVE, (ENSURE ENLIVE) LIQD Take 237 mLs by mouth 2 (two) times daily between meals. 60 Bottle 0  . levothyroxine (SYNTHROID, LEVOTHROID) 125 MCG tablet TAKE 1 TABLET BY MOUTH DAILY 90 tablet 0  . lidocaine-prilocaine (EMLA) cream Apply to affected area once 30 g 3  . magic mouthwash w/lidocaine SOLN Take 5 mLs by mouth 4 (four) times daily as needed for mouth pain. Swish and Spit 240 mL 0  .  Omega-3 Fatty Acids (OMEGA 3 PO) Take by mouth.    [name] omeprazole (PRILOSEC) 20 MG capsule Take 2 x 30 min before breakfast (Patient taking differently: Take 20 mg by mouth daily. )    . ondansetron (ZOFRAN) 8 MG tablet Take 1 tablet (8 mg total) by mouth 2 (two) times daily as needed for refractory nausea / vomiting. Start on day 3 after cyclophosphamide chemotherapy. 30 tablet 1  . prochlorperazine (COMPAZINE) 10 MG tablet Take 1 tablet (10 mg total) by mouth every 6 (six) hours as needed (Nausea or vomiting). 30 tablet 6  . albuterol (PROVENTIL HFA;VENTOLIN HFA) 108 (90 Base) MCG/ACT inhaler Inhale 2 puffs into the lungs every 6 (six) hours as needed for up to 30 days for wheezing or shortness of breath. 1 Inhaler 2  . potassium chloride 20 MEQ TBCR Take 20 mEq by mouth daily for 3 days. 3  tablet 0   No current facility-administered medications for this visit.     PHYSICAL EXAMINATION: ECOG PERFORMANCE STATUS: 1 - Symptomatic but completely ambulatory  There were no vitals filed for this visit. There is no height or weight on file to calculate BMI.  There were no vitals filed for this visit.  GENERAL: alert, no distress and comfortable SKIN: skin color, texture, turgor are normal, no rashes or significant lesions EYES: conjunctiva are pink and non-injected, sclera clear OROPHARYNX: no exudate, no erythema; lips, buccal mucosa, and tongue normal  NECK: supple, non-tender LYMPH:  no palpable lymphadenopathy in the cervical LUNGS: clear to auscultation with normal breathing effort HEART: regular rate & rhythm and no murmurs and no lower extremity edema ABDOMEN: soft, non-tender, non-distended, normal bowel sounds Musculoskeletal: no cyanosis of digits and no clubbing  PSYCH: alert & oriented x 3, fluent speech  LABORATORY DATA:  I have reviewed the data as listed    Component Value Date/Time   NA 142 07/30/2018 1429   NA 129 (A) 06/23/2018   K 3.8 07/30/2018 1429   CL 104 07/30/2018 1429   CO2 30 07/30/2018 1429   GLUCOSE 96 07/30/2018 1429   BUN 16 07/30/2018 1429   BUN 14 06/23/2018   CREATININE 1.23 (H) 07/30/2018 1429   CREATININE 1.36 (H) 10/27/2015 1530   CALCIUM 9.1 07/30/2018 1429   CALCIUM 12.3 (H) 06/16/2018 1639   PROT 6.0 (L) 07/30/2018 1429   ALBUMIN 4.0 07/30/2018 1429   AST 15 07/30/2018 1429   ALT 13 07/30/2018 1429   ALKPHOS 89 07/30/2018 1429   BILITOT 0.3 07/30/2018 1429   GFRNONAA 42 (L) 07/30/2018 1429   GFRAA 48 (L) 07/30/2018 1429    No results found for: SPEP, UPEP  Lab Results  Component Value Date   WBC 13.9 (H) 07/30/2018   NEUTROABS 8.7 (H) 07/30/2018   HGB 9.9 (L) 07/30/2018   HCT 31.3 (L) 07/30/2018   MCV 83.2 07/30/2018   PLT 271 07/30/2018      Chemistry      Component Value Date/Time   NA 142 07/30/2018 1429    NA 129 (A) 06/23/2018   K 3.8 07/30/2018 1429   CL 104 07/30/2018 1429   CO2 30 07/30/2018 1429   BUN 16 07/30/2018 1429   BUN 14 06/23/2018   CREATININE 1.23 (H) 07/30/2018 1429   CREATININE 1.36 (H) 10/27/2015 1530   GLU 91 06/23/2018      Component Value Date/Time   CALCIUM 9.1 07/30/2018 1429   CALCIUM 12.3 (H) 06/16/2018 1639   ALKPHOS 89 07/30/2018 1429  AST 15 07/30/2018 1429   ALT 13 07/30/2018 1429   BILITOT 0.3 07/30/2018 1429       RADIOGRAPHIC STUDIES: I have personally reviewed the radiological images as listed below and agreed with the findings in the report. Dg Chest 2 View  Result Date: 07/01/2018 CLINICAL DATA:  Shortness of breath and cough EXAM: CHEST - 2 VIEW COMPARISON:  June 30, 2018 FINDINGS: There are pleural effusions bilaterally, larger on the left than on the right, stable. There is atelectatic change in the bases with patchy airspace disease in the bases as well, stable. No new opacity evident. Heart is upper normal in size with pulmonary vascularity normal. No adenopathy. No bone lesions. IMPRESSION: Bilateral pleural effusions, larger on the left than on the right, with bibasilar atelectasis and patchy airspace opacity, essentially stable. Stable heart upper normal in size. No adenopathy evident. Electronically Signed   By: Lowella Grip III M.D.   On: 07/01/2018 08:03   [facility] Guided Port Insertion  Result Date: 07/15/2018 INDICATION: 78 year old with marginal zone lymphoma. Port-A-Cath needed for therapy. EXAM: FLUOROSCOPIC AND ULTRASOUND GUIDED PLACEMENT OF A SUBCUTANEOUS PORT COMPARISON:  None. MEDICATIONS: Ancef 2 g; The antibiotic was administered within an appropriate time interval prior to skin puncture. ANESTHESIA/SEDATION: Versed 4.0 mg IV; Fentanyl 100 mcg IV; Moderate Sedation Time:  39 minutes The patient was continuously monitored during the procedure by the interventional radiology nurse under my direct supervision. FLUOROSCOPY  TIME:  42 seconds, 4 mGy COMPLICATIONS: None immediate. PROCEDURE: The procedure, risks, benefits, and alternatives were explained to the patient. Questions regarding the procedure were encouraged and answered. The patient understands and consents to the procedure. Patient was placed supine on the interventional table. Ultrasound confirmed a patent right internal jugular vein. The right chest and neck were cleaned with a skin antiseptic and a sterile drape was placed. Maximal barrier sterile technique was utilized including caps, mask, sterile gowns, sterile gloves, sterile drape, hand hygiene and skin antiseptic. The right neck was anesthetized with 1% lidocaine. Small incision was made in the right neck with a blade. Micropuncture set was placed in the right internal jugular vein with ultrasound guidance. The micropuncture wire was used for measurement purposes. The right chest was anesthetized with 1% lidocaine with epinephrine. #15 blade was used to make an incision and a subcutaneous port pocket was formed. [place] was assembled. Subcutaneous tunnel was formed with a stiff tunneling device. The port catheter was brought through the subcutaneous tunnel. The port was placed in the subcutaneous pocket and sutured in place. The micropuncture set was exchanged for a peel-away sheath. The catheter was placed through the peel-away sheath and the tip was positioned at the SVC and right atrium junction. Catheter placement was confirmed with fluoroscopy. The port was accessed and flushed with heparinized saline. The port pocket was closed using two layers of absorbable sutures and Dermabond. The vein skin site was closed using a single layer of absorbable suture and Dermabond. Sterile dressings were applied. Patient tolerated the procedure well without an immediate complication. Ultrasound and fluoroscopic images were taken and saved for this procedure. IMPRESSION: Placement of a subcutaneous CT injectable  port device. Catheter tip at the SVC and right atrium junction. Electronically Signed   By: Markus Daft M.D.   On: 07/15/2018 14:31

## 2018-07-28 NOTE — Telephone Encounter (Signed)
Copied from [place] [id number]. Topic: Quick Communication - See Telephone Encounter >> Jul 28, 2018  4:45 PM Nils Flack wrote: CRM for notification. See Telephone encounter for: 07/28/18. Pt was unable to schedule visit with [facility] this week.  Pt said she felt fine.   She will have appt next week and discharge  [facility] cb is [phone]

## 2018-07-30 ENCOUNTER — Other Ambulatory Visit: Payer: Medicare Other

## 2018-07-30 ENCOUNTER — Other Ambulatory Visit: Payer: Self-pay

## 2018-07-30 ENCOUNTER — Telehealth: Payer: Self-pay | Admitting: Hematology

## 2018-07-30 ENCOUNTER — Inpatient Hospital Stay (HOSPITAL_BASED_OUTPATIENT_CLINIC_OR_DEPARTMENT_OTHER): Payer: Medicare Other | Admitting: Hematology

## 2018-07-30 ENCOUNTER — Encounter: Payer: Self-pay | Admitting: Hematology

## 2018-07-30 ENCOUNTER — Inpatient Hospital Stay: Payer: Medicare Other

## 2018-07-30 DIAGNOSIS — D72829 Elevated white blood cell count, unspecified: Secondary | ICD-10-CM | POA: Diagnosis not present

## 2018-07-30 DIAGNOSIS — T451X5A Adverse effect of antineoplastic and immunosuppressive drugs, initial encounter: Secondary | ICD-10-CM

## 2018-07-30 DIAGNOSIS — Z7951 Long term (current) use of inhaled steroids: Secondary | ICD-10-CM

## 2018-07-30 DIAGNOSIS — I10 Essential (primary) hypertension: Secondary | ICD-10-CM

## 2018-07-30 DIAGNOSIS — J90 Pleural effusion, not elsewhere classified: Secondary | ICD-10-CM | POA: Diagnosis not present

## 2018-07-30 DIAGNOSIS — Z79899 Other long term (current) drug therapy: Secondary | ICD-10-CM

## 2018-07-30 DIAGNOSIS — R5383 Other fatigue: Secondary | ICD-10-CM | POA: Diagnosis not present

## 2018-07-30 DIAGNOSIS — R110 Nausea: Secondary | ICD-10-CM | POA: Diagnosis not present

## 2018-07-30 DIAGNOSIS — K219 Gastro-esophageal reflux disease without esophagitis: Secondary | ICD-10-CM | POA: Diagnosis not present

## 2018-07-30 DIAGNOSIS — D649 Anemia, unspecified: Secondary | ICD-10-CM | POA: Diagnosis not present

## 2018-07-30 DIAGNOSIS — C8580 Other specified types of non-Hodgkin lymphoma, unspecified site: Secondary | ICD-10-CM | POA: Diagnosis not present

## 2018-07-30 LAB — CBC WITH DIFFERENTIAL (CANCER CENTER ONLY)
Abs Immature Granulocytes: 2.15 10*3/uL — ABNORMAL HIGH (ref 0.00–0.07)
Basophils Absolute: 0 10*3/uL (ref 0.0–0.1)
Basophils Relative: 0 %
Eosinophils Absolute: 0 10*3/uL (ref 0.0–0.5)
Eosinophils Relative: 0 %
HCT: 31.3 % — ABNORMAL LOW (ref 36.0–46.0)
Hemoglobin: 9.9 g/dL — ABNORMAL LOW (ref 12.0–15.0)
Immature Granulocytes: 15 %
Lymphocytes Relative: 11 %
Lymphs Abs: 1.6 10*3/uL (ref 0.7–4.0)
MCH: 26.3 pg (ref 26.0–34.0)
MCHC: 31.6 g/dL (ref 30.0–36.0)
MCV: 83.2 fL (ref 80.0–100.0)
Monocytes Absolute: 1.5 10*3/uL — ABNORMAL HIGH (ref 0.1–1.0)
Monocytes Relative: 11 %
Neutro Abs: 8.7 10*3/uL — ABNORMAL HIGH (ref 1.7–7.7)
Neutrophils Relative %: 63 %
Platelet Count: 271 10*3/uL (ref 150–400)
RBC: 3.76 MIL/uL — ABNORMAL LOW (ref 3.87–5.11)
RDW: 16.3 % — ABNORMAL HIGH (ref 11.5–15.5)
WBC Count: 13.9 10*3/uL — ABNORMAL HIGH (ref 4.0–10.5)
nRBC: 0 % (ref 0.0–0.2)

## 2018-07-30 LAB — CMP (CANCER CENTER ONLY)
ALT: 13 U/L (ref 0–44)
AST: 15 U/L (ref 15–41)
Albumin: 4 g/dL (ref 3.5–5.0)
Alkaline Phosphatase: 89 U/L (ref 38–126)
Anion gap: 8 (ref 5–15)
BUN: 16 mg/dL (ref 8–23)
CO2: 30 mmol/L (ref 22–32)
Calcium: 9.1 mg/dL (ref 8.9–10.3)
Chloride: 104 mmol/L (ref 98–111)
Creatinine: 1.23 mg/dL — ABNORMAL HIGH (ref 0.44–1.00)
GFR, Est AFR Am: 48 mL/min — ABNORMAL LOW (ref >60)
GFR, Estimated: 42 mL/min — ABNORMAL LOW (ref >60)
Glucose, Bld: 96 mg/dL (ref 70–99)
Potassium: 3.8 mmol/L (ref 3.5–5.1)
Sodium: 142 mmol/L (ref 135–145)
Total Bilirubin: 0.3 mg/dL (ref 0.3–1.2)
Total Protein: 6 g/dL — ABNORMAL LOW (ref 6.5–8.1)

## 2018-07-30 LAB — MAGNESIUM: Magnesium: 1.8 mg/dL (ref 1.7–2.4)

## 2018-07-30 NOTE — Telephone Encounter (Signed)
noted 

## 2018-07-30 NOTE — Telephone Encounter (Signed)
No change in appts per 5/28 los

## 2018-07-30 NOTE — Patient Instructions (Signed)

## 2018-07-31 ENCOUNTER — Telehealth: Payer: Self-pay | Admitting: *Deleted

## 2018-07-31 DIAGNOSIS — Z8701 Personal history of pneumonia (recurrent): Secondary | ICD-10-CM | POA: Diagnosis not present

## 2018-07-31 DIAGNOSIS — I129 Hypertensive chronic kidney disease with stage 1 through stage 4 chronic kidney disease, or unspecified chronic kidney disease: Secondary | ICD-10-CM | POA: Diagnosis not present

## 2018-07-31 DIAGNOSIS — R1310 Dysphagia, unspecified: Secondary | ICD-10-CM | POA: Diagnosis not present

## 2018-07-31 DIAGNOSIS — Z5181 Encounter for therapeutic drug level monitoring: Secondary | ICD-10-CM | POA: Diagnosis not present

## 2018-07-31 DIAGNOSIS — N183 Chronic kidney disease, stage 3 (moderate): Secondary | ICD-10-CM | POA: Diagnosis not present

## 2018-07-31 DIAGNOSIS — C8590 Non-Hodgkin lymphoma, unspecified, unspecified site: Secondary | ICD-10-CM | POA: Diagnosis not present

## 2018-07-31 LAB — FERRITIN: Ferritin: 712 ng/mL — ABNORMAL HIGH (ref 11–307)

## 2018-07-31 LAB — IRON AND TIBC
Iron: 68 ug/dL (ref 41–142)
Saturation Ratios: 27 % (ref 21–57)
TIBC: 256 ug/dL (ref 236–444)
UIBC: 188 ug/dL (ref 120–384)

## 2018-07-31 LAB — SOLUBLE TRANSFERRIN RECEPTOR: Transferrin Receptor: 22.4 nmol/L (ref 12.2–27.3)

## 2018-07-31 NOTE — Telephone Encounter (Signed)
Notified pt of results and MD instruction.

## 2018-07-31 NOTE — Telephone Encounter (Signed)
-----   Message from Tish Men, MD sent at 07/30/2018  3:25 PM EDT ----- One more patient,   Ms. Klecka's labs were mostly okay. She needs to stay hydrated, as her Cr is a little higher, and her anemia and leukocytosis are from her chemotherapy.  Thanks. Rhame  ----- Message ----- From: Buel Ream, [facility] [place] Sent: 07/30/2018   2:49 PM EDT To: Tish Men, MD

## 2018-08-04 DIAGNOSIS — Z5181 Encounter for therapeutic drug level monitoring: Secondary | ICD-10-CM | POA: Diagnosis not present

## 2018-08-04 DIAGNOSIS — N183 Chronic kidney disease, stage 3 (moderate): Secondary | ICD-10-CM | POA: Diagnosis not present

## 2018-08-04 DIAGNOSIS — R1310 Dysphagia, unspecified: Secondary | ICD-10-CM | POA: Diagnosis not present

## 2018-08-04 DIAGNOSIS — Z8701 Personal history of pneumonia (recurrent): Secondary | ICD-10-CM | POA: Diagnosis not present

## 2018-08-04 DIAGNOSIS — I129 Hypertensive chronic kidney disease with stage 1 through stage 4 chronic kidney disease, or unspecified chronic kidney disease: Secondary | ICD-10-CM | POA: Diagnosis not present

## 2018-08-04 DIAGNOSIS — C8590 Non-Hodgkin lymphoma, unspecified, unspecified site: Secondary | ICD-10-CM | POA: Diagnosis not present

## 2018-08-08 ENCOUNTER — Other Ambulatory Visit: Payer: Self-pay | Admitting: Family Medicine

## 2018-08-08 DIAGNOSIS — E039 Hypothyroidism, unspecified: Secondary | ICD-10-CM

## 2018-08-10 NOTE — Progress Notes (Signed)
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 CT chest showed pathologic lymphadenopathy involving bilateral axilla, mediastinum as well as multiple small lung nodules and possible splenomegaly  ? PET showed FDG-avid lymphadenopathy in neck, chest, abdomen and pelvis, questionable involvement of the lung, and focal involvement of left scapula and acetabulum  ? Left axillary LN excisional bx: NHL, favoring low-grade marginal zone lymphoma; Cyclin-D1 neg ([facility]) -06/2018: admitted for AKI secondary to hypercalcemia (Ca 13); LVEF normal  -07/2018 - present: mini-R-CHOP with Onpro   TREATMENT REGIMEN:  07/21/2018 - present: mini-R-CHOP with Onpro; plan for 6 cycles    ASSESSMENT & PLAN:    Stage IV nodal marginal zone lymphoma  -S/p 1 cycle of mini-R-CHOP  -Patient tolerated the treatment relatively well without significant side effects -Labs adequate, proceed with Cycle 2 of chemotherapy  -We will plan to repeat PET after 3 cycles of treatment to assess interim response  -Prophylaxis: acyclovir, allopurinol -PRN anti-emetics: Zofran and Compazine    Normocytic anemia -Possibly due to bone marrow involvement by lymphoma and chemothrapy  -Hgb 10.3 today; iron studies consistent with anemia of chronic disease  -Patient denies any symptoms of bleeding  -We will monitor it for now     Chemotherapy-associated nausea  -Secondary to chemotherapy -Symptoms relatively well controlled  -Continue PRN-anti-emetics   Chemotherapy-associated hair loss -Secondary  to chemotherapy -I have ordered hair prosthesis today   Stage III CKD -Baseline Cr ~1.1 -Cr 1.26 today, stable; electrolytes normal -I counseled the patient on the importance of maintaining adequate hydration -We will monitor it for now   No orders of the defined types were placed in this encounter.  All questions were answered. The patient knows to call the clinic with any problems, questions or concerns. No barriers to learning was detected.  Return on 09/01/2018 for labs, port flush, clinic appt and Cycle 3 of chemotherapy.   Tish Men, MD 08/11/2018 9:17 AM  CHIEF COMPLAINT: "I am doing fine "  INTERVAL HISTORY: Ms. Karan returns to clinic for follow-up of nodal marginal zone lymphoma on mini-R-CHOP.  Patient reports that she had occasional nausea, for which she took Zofran with relief of the symptom.  She denied any vomiting.  She otherwise has been doing well since last treatment, and denies any fever, chill, night sweats, weight change, chest pain, dyspnea, abdominal pain, diarrhea, or numbness/tingling in hands and feet.  SUMMARY OF ONCOLOGIC HISTORY:   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[place])   05/21/2018 Initial Diagnosis    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[place])    07/21/2018 -  Chemotherapy    The patient had DOXOrubicin (ADRIAMYCIN) chemo injection 42 mg, 25 mg/m2 = 42 mg (100 % of original dose 25 mg/m2), Intravenous,  Once, 1 of 6 cycles Dose modification: 25 mg/m2 (original dose 25 mg/m2, Cycle 1, Reason: Patient Age) Administration: 42 mg (07/21/2018) palonosetron (ALOXI) injection 0.25 mg, 0.25 mg, Intravenous,  Once, 1 of 6 cycles Administration: 0.25 mg (07/21/2018) pegfilgrastim (NEULASTA ONPRO KIT) injection 6 mg, 6 mg, Subcutaneous, Once, 1 of 6 cycles Administration: 6 mg (07/21/2018) vinCRIStine (ONCOVIN) 1 mg in sodium chloride 0.9 % 50  mL chemo infusion, 1 mg (100 % of original dose 1 mg), Intravenous,  Once, 1 of 6 cycles Dose modification: 1 mg (original dose 1 mg, Cycle 1,  Reason: Patient Age) Administration: 1 mg (07/21/2018) riTUXimab (RITUXAN) 600 mg in sodium chloride 0.9 % 250 mL (1.9355 mg/mL) infusion, 375 mg/m2 = 600 mg, Intravenous,  Once, 1 of 6 cycles Administration: 600 mg (07/21/2018) cyclophosphamide (CYTOXAN) 660 mg in sodium chloride 0.9 % 250 mL chemo infusion, 400 mg/m2 = 660 mg (100 % of original dose 400 mg/m2), Intravenous,  Once, 1 of 6 cycles Dose modification: 400 mg/m2 (original dose 400 mg/m2, Cycle 1, Reason: Patient Age) Administration: 660 mg (07/21/2018)  for chemotherapy treatment.      REVIEW OF SYSTEMS:   Constitutional: ( - ) fevers, ( - )  chills , ( - ) night sweats Eyes: ( - ) blurriness of vision, ( - ) double vision, ( - ) watery eyes Ears, nose, mouth, throat, and face: ( - ) mucositis, ( - ) sore throat Respiratory: ( - ) cough, ( - ) dyspnea, ( - ) wheezes Cardiovascular: ( - ) palpitation, ( - ) chest discomfort, ( - ) lower extremity swelling Gastrointestinal:  ( + ) nausea, ( - ) heartburn, ( - ) change in bowel habits Skin: ( - ) abnormal skin rashes Lymphatics: ( - ) new lymphadenopathy, ( - ) easy bruising Neurological: ( - ) numbness, ( - ) tingling, ( - ) new weaknesses Behavioral/Psych: ( - ) mood change, ( - ) new changes  All other systems were reviewed with the patient and are negative.  I have reviewed the past medical history, past surgical history, social history and family history with the patient and they are unchanged from previous note.  ALLERGIES:  is allergic to phenergan [promethazine hcl]; levofloxacin; and pravastatin sodium.  MEDICATIONS:  Current Outpatient Medications  Medication Sig Dispense Refill  . acetaminophen (TYLENOL) 500 MG tablet Take 1,000 mg by mouth every 6 (six) hours as needed for mild pain.    [name] acyclovir (ZOVIRAX) 400 MG tablet Take 1 tablet (400 mg total) by mouth 2 (two) times daily for 30 days. 60 tablet 5  . albuterol (PROVENTIL) (2.5 MG/3ML) 0.083% nebulizer  solution Take 3 mLs (2.5 mg total) by nebulization every 6 (six) hours as needed for wheezing or shortness of breath. 75 mL 12  . allopurinol (ZYLOPRIM) 300 MG tablet Take 1 tablet (300 mg total) by mouth daily. 30 tablet 3  . amLODipine (NORVASC) 5 MG tablet Take 1 tablet (5 mg total) by mouth daily with breakfast. 90 tablet 1  . buPROPion (WELLBUTRIN XL) 150 MG 24 hr tablet TAKE 1 TABLET BY MOUTH EVERY DAY 90 tablet 1  . famotidine (PEPCID) 20 MG tablet One at bedtime (Patient taking differently: Take 20 mg by mouth at bedtime. One at bedtime ) 30 tablet 11  . feeding supplement, ENSURE ENLIVE, (ENSURE ENLIVE) LIQD Take 237 mLs by mouth 2 (two) times daily between meals. 60 Bottle 0  . levothyroxine (SYNTHROID) 125 MCG tablet TAKE 1 TABLET BY MOUTH DAILY 90 tablet 0  . lidocaine-prilocaine (EMLA) cream Apply to affected area once 30 g 3  . magic mouthwash w/lidocaine SOLN Take 5 mLs by mouth 4 (four) times daily as needed for mouth pain. Swish and Spit 240 mL 0  . Omega-3 Fatty Acids (OMEGA 3 PO) Take by mouth.    [name] omeprazole (PRILOSEC) 20 MG capsule Take 2 x 30 min  before breakfast (Patient taking differently: Take 20 mg by mouth daily. )    . ondansetron (ZOFRAN) 8 MG tablet Take 1 tablet (8 mg total) by mouth 2 (two) times daily as needed for refractory nausea / vomiting. Start on day 3 after cyclophosphamide chemotherapy. 30 tablet 1  . prochlorperazine (COMPAZINE) 10 MG tablet Take 1 tablet (10 mg total) by mouth every 6 (six) hours as needed (Nausea or vomiting). 30 tablet 6   No current facility-administered medications for this visit.     PHYSICAL EXAMINATION: ECOG PERFORMANCE STATUS: 2 - Symptomatic, <50% confined to bed  Today's Vitals   08/11/18 0907  BP: 138/67  Pulse: 78  Resp: 18  Temp: 98 F (36.7 C)  TempSrc: Oral  SpO2: 99%  Weight: 136 lb (61.7 kg)  Height: '5\' 1"'  (1.549 m)  PainSc: 0-No pain   Body mass index is 25.7 kg/m.  Filed Weights   08/11/18 0907   Weight: 136 lb (61.7 kg)    GENERAL: alert, no distress and comfortable SKIN: skin color, texture, turgor are normal, no rashes or significant lesions EYES: conjunctiva are pink and non-injected, sclera clear OROPHARYNX: no exudate, no erythema; lips, buccal mucosa, and tongue normal  NECK: supple, non-tender LUNGS: clear to auscultation with normal breathing effort HEART: regular rate & rhythm and no murmurs and no lower extremity edema ABDOMEN: soft, non-tender, non-distended, normal bowel sounds Musculoskeletal: no cyanosis of digits and no clubbing  PSYCH: alert & oriented x 3, fluent speech NEURO: no focal motor/sensory deficits  LABORATORY DATA:  I have reviewed the data as listed    Component Value Date/Time   NA 139 08/11/2018 0835   NA 129 (A) 06/23/2018   K 4.0 08/11/2018 0835   CL 105 08/11/2018 0835   CO2 25 08/11/2018 0835   GLUCOSE 123 (H) 08/11/2018 0835   BUN 24 (H) 08/11/2018 0835   BUN 14 06/23/2018   CREATININE 1.26 (H) 08/11/2018 0835   CREATININE 1.36 (H) 10/27/2015 1530   CALCIUM 9.6 08/11/2018 0835   CALCIUM 12.3 (H) 06/16/2018 1639   PROT 6.2 (L) 08/11/2018 0835   ALBUMIN 4.2 08/11/2018 0835   AST 16 08/11/2018 0835   ALT 11 08/11/2018 0835   ALKPHOS 63 08/11/2018 0835   BILITOT 0.4 08/11/2018 0835   GFRNONAA 40 (L) 08/11/2018 0835   GFRAA 47 (L) 08/11/2018 0835    No results found for: SPEP, UPEP  Lab Results  Component Value Date   WBC 5.5 08/11/2018   NEUTROABS 3.2 08/11/2018   HGB 10.3 (L) 08/11/2018   HCT 31.5 (L) 08/11/2018   MCV 83.3 08/11/2018   PLT 306 08/11/2018      Chemistry      Component Value Date/Time   NA 139 08/11/2018 0835   NA 129 (A) 06/23/2018   K 4.0 08/11/2018 0835   CL 105 08/11/2018 0835   CO2 25 08/11/2018 0835   BUN 24 (H) 08/11/2018 0835   BUN 14 06/23/2018   CREATININE 1.26 (H) 08/11/2018 0835   CREATININE 1.36 (H) 10/27/2015 1530   GLU 91 06/23/2018      Component Value Date/Time   CALCIUM 9.6  08/11/2018 0835   CALCIUM 12.3 (H) 06/16/2018 1639   ALKPHOS 63 08/11/2018 0835   AST 16 08/11/2018 0835   ALT 11 08/11/2018 0835   BILITOT 0.4 08/11/2018 0835       RADIOGRAPHIC STUDIES: I have personally reviewed the radiological images as listed below and agreed with the findings in the  report. Ir Imaging Guided Port Insertion  Result Date: 07/15/2018 INDICATION: 78 year old with marginal zone lymphoma. Port-A-Cath needed for therapy. EXAM: FLUOROSCOPIC AND ULTRASOUND GUIDED PLACEMENT OF A SUBCUTANEOUS PORT COMPARISON:  None. MEDICATIONS: Ancef 2 g; The antibiotic was administered within an appropriate time interval prior to skin puncture. ANESTHESIA/SEDATION: Versed 4.0 mg IV; Fentanyl 100 mcg IV; Moderate Sedation Time:  39 minutes The patient was continuously monitored during the procedure by the interventional radiology nurse under my direct supervision. FLUOROSCOPY TIME:  42 seconds, 4 mGy COMPLICATIONS: None immediate. PROCEDURE: The procedure, risks, benefits, and alternatives were explained to the patient. Questions regarding the procedure were encouraged and answered. The patient understands and consents to the procedure. Patient was placed supine on the interventional table. Ultrasound confirmed a patent right internal jugular vein. The right chest and neck were cleaned with a skin antiseptic and a sterile drape was placed. Maximal barrier sterile technique was utilized including caps, mask, sterile gowns, sterile gloves, sterile drape, hand hygiene and skin antiseptic. The right neck was anesthetized with 1% lidocaine. Small incision was made in the right neck with a blade. Micropuncture set was placed in the right internal jugular vein with ultrasound guidance. The micropuncture wire was used for measurement purposes. The right chest was anesthetized with 1% lidocaine with epinephrine. #15 blade was used to make an incision and a subcutaneous port pocket was formed. Ava  was assembled. Subcutaneous tunnel was formed with a stiff tunneling device. The port catheter was brought through the subcutaneous tunnel. The port was placed in the subcutaneous pocket and sutured in place. The micropuncture set was exchanged for a peel-away sheath. The catheter was placed through the peel-away sheath and the tip was positioned at the SVC and right atrium junction. Catheter placement was confirmed with fluoroscopy. The port was accessed and flushed with heparinized saline. The port pocket was closed using two layers of absorbable sutures and Dermabond. The vein skin site was closed using a single layer of absorbable suture and Dermabond. Sterile dressings were applied. Patient tolerated the procedure well without an immediate complication. Ultrasound and fluoroscopic images were taken and saved for this procedure. IMPRESSION: Placement of a subcutaneous CT injectable port device. Catheter tip at the SVC and right atrium junction. Electronically Signed   By: Markus Daft M.D.   On: 07/15/2018 14:31

## 2018-08-11 ENCOUNTER — Inpatient Hospital Stay: Payer: Medicare Other | Attending: Hematology

## 2018-08-11 ENCOUNTER — Encounter: Payer: Self-pay | Admitting: Hematology

## 2018-08-11 ENCOUNTER — Telehealth: Payer: Self-pay | Admitting: Hematology

## 2018-08-11 ENCOUNTER — Inpatient Hospital Stay (HOSPITAL_BASED_OUTPATIENT_CLINIC_OR_DEPARTMENT_OTHER): Payer: Medicare Other | Admitting: Hematology

## 2018-08-11 ENCOUNTER — Inpatient Hospital Stay: Payer: Medicare Other

## 2018-08-11 ENCOUNTER — Other Ambulatory Visit: Payer: Self-pay

## 2018-08-11 VITALS — BP 138/67 | HR 78 | Temp 98.0°F | Resp 18 | Ht 61.0 in | Wt 136.0 lb

## 2018-08-11 DIAGNOSIS — D649 Anemia, unspecified: Secondary | ICD-10-CM

## 2018-08-11 DIAGNOSIS — N183 Chronic kidney disease, stage 3 unspecified: Secondary | ICD-10-CM | POA: Insufficient documentation

## 2018-08-11 DIAGNOSIS — L659 Nonscarring hair loss, unspecified: Secondary | ICD-10-CM | POA: Diagnosis not present

## 2018-08-11 DIAGNOSIS — I10 Essential (primary) hypertension: Secondary | ICD-10-CM | POA: Insufficient documentation

## 2018-08-11 DIAGNOSIS — Z5112 Encounter for antineoplastic immunotherapy: Secondary | ICD-10-CM | POA: Diagnosis not present

## 2018-08-11 DIAGNOSIS — Z79899 Other long term (current) drug therapy: Secondary | ICD-10-CM | POA: Diagnosis not present

## 2018-08-11 DIAGNOSIS — T451X5A Adverse effect of antineoplastic and immunosuppressive drugs, initial encounter: Secondary | ICD-10-CM | POA: Diagnosis not present

## 2018-08-11 DIAGNOSIS — D631 Anemia in chronic kidney disease: Secondary | ICD-10-CM | POA: Diagnosis not present

## 2018-08-11 DIAGNOSIS — K219 Gastro-esophageal reflux disease without esophagitis: Secondary | ICD-10-CM

## 2018-08-11 DIAGNOSIS — C8580 Other specified types of non-Hodgkin lymphoma, unspecified site: Secondary | ICD-10-CM | POA: Diagnosis not present

## 2018-08-11 DIAGNOSIS — Z5111 Encounter for antineoplastic chemotherapy: Secondary | ICD-10-CM | POA: Diagnosis not present

## 2018-08-11 DIAGNOSIS — R110 Nausea: Secondary | ICD-10-CM

## 2018-08-11 DIAGNOSIS — G4700 Insomnia, unspecified: Secondary | ICD-10-CM | POA: Insufficient documentation

## 2018-08-11 DIAGNOSIS — Z5189 Encounter for other specified aftercare: Secondary | ICD-10-CM | POA: Diagnosis not present

## 2018-08-11 LAB — CMP (CANCER CENTER ONLY)
ALT: 11 U/L (ref 0–44)
AST: 16 U/L (ref 15–41)
Albumin: 4.2 g/dL (ref 3.5–5.0)
Alkaline Phosphatase: 63 U/L (ref 38–126)
Anion gap: 9 (ref 5–15)
BUN: 24 mg/dL — ABNORMAL HIGH (ref 8–23)
CO2: 25 mmol/L (ref 22–32)
Calcium: 9.6 mg/dL (ref 8.9–10.3)
Chloride: 105 mmol/L (ref 98–111)
Creatinine: 1.26 mg/dL — ABNORMAL HIGH (ref 0.44–1.00)
GFR, Est AFR Am: 47 mL/min — ABNORMAL LOW (ref >60)
GFR, Estimated: 40 mL/min — ABNORMAL LOW (ref >60)
Glucose, Bld: 123 mg/dL — ABNORMAL HIGH (ref 70–99)
Potassium: 4 mmol/L (ref 3.5–5.1)
Sodium: 139 mmol/L (ref 135–145)
Total Bilirubin: 0.4 mg/dL (ref 0.3–1.2)
Total Protein: 6.2 g/dL — ABNORMAL LOW (ref 6.5–8.1)

## 2018-08-11 LAB — CBC WITH DIFFERENTIAL (CANCER CENTER ONLY)
Abs Immature Granulocytes: 0.06 10*3/uL (ref 0.00–0.07)
Basophils Absolute: 0.1 10*3/uL (ref 0.0–0.1)
Basophils Relative: 3 %
Eosinophils Absolute: 0.1 10*3/uL (ref 0.0–0.5)
Eosinophils Relative: 1 %
HCT: 31.5 % — ABNORMAL LOW (ref 36.0–46.0)
Hemoglobin: 10.3 g/dL — ABNORMAL LOW (ref 12.0–15.0)
Immature Granulocytes: 1 %
Lymphocytes Relative: 22 %
Lymphs Abs: 1.2 10*3/uL (ref 0.7–4.0)
MCH: 27.2 pg (ref 26.0–34.0)
MCHC: 32.7 g/dL (ref 30.0–36.0)
MCV: 83.3 fL (ref 80.0–100.0)
Monocytes Absolute: 0.8 10*3/uL (ref 0.1–1.0)
Monocytes Relative: 15 %
Neutro Abs: 3.2 10*3/uL (ref 1.7–7.7)
Neutrophils Relative %: 58 %
Platelet Count: 306 10*3/uL (ref 150–400)
RBC: 3.78 MIL/uL — ABNORMAL LOW (ref 3.87–5.11)
RDW: 18.3 % — ABNORMAL HIGH (ref 11.5–15.5)
WBC Count: 5.5 10*3/uL (ref 4.0–10.5)
nRBC: 0 % (ref 0.0–0.2)

## 2018-08-11 LAB — MAGNESIUM: Magnesium: 1.9 mg/dL (ref 1.7–2.4)

## 2018-08-11 MED ORDER — HEPARIN SOD (PORK) LOCK FLUSH 100 UNIT/ML IV SOLN
500.0000 [IU] | Freq: Once | INTRAVENOUS | Status: AC | PRN
  Administered 2018-08-11: 500 [IU]
  Filled 2018-08-11: qty 5

## 2018-08-11 MED ORDER — ACETAMINOPHEN 325 MG PO TABS
ORAL_TABLET | ORAL | Status: AC
  Filled 2018-08-11: qty 2

## 2018-08-11 MED ORDER — PALONOSETRON HCL INJECTION 0.25 MG/5ML
0.2500 mg | Freq: Once | INTRAVENOUS | Status: AC
  Administered 2018-08-11: 10:00:00 0.25 mg via INTRAVENOUS

## 2018-08-11 MED ORDER — ACETAMINOPHEN 325 MG PO TABS
650.0000 mg | ORAL_TABLET | Freq: Once | ORAL | Status: AC
  Administered 2018-08-11: 650 mg via ORAL

## 2018-08-11 MED ORDER — PEGFILGRASTIM 6 MG/0.6ML ~~LOC~~ PSKT
6.0000 mg | PREFILLED_SYRINGE | Freq: Once | SUBCUTANEOUS | Status: AC
  Administered 2018-08-11: 6 mg via SUBCUTANEOUS

## 2018-08-11 MED ORDER — PALONOSETRON HCL INJECTION 0.25 MG/5ML
INTRAVENOUS | Status: AC
  Filled 2018-08-11: qty 5

## 2018-08-11 MED ORDER — SODIUM CHLORIDE 0.9% FLUSH
10.0000 mL | INTRAVENOUS | Status: DC | PRN
  Administered 2018-08-11: 10 mL
  Filled 2018-08-11: qty 10

## 2018-08-11 MED ORDER — DOXORUBICIN HCL CHEMO IV INJECTION 2 MG/ML
25.0000 mg/m2 | Freq: Once | INTRAVENOUS | Status: AC
  Administered 2018-08-11: 42 mg via INTRAVENOUS
  Filled 2018-08-11: qty 21

## 2018-08-11 MED ORDER — SODIUM CHLORIDE 0.9 % IV SOLN
Freq: Once | INTRAVENOUS | Status: AC
  Administered 2018-08-11: 09:00:00 via INTRAVENOUS
  Filled 2018-08-11: qty 250

## 2018-08-11 MED ORDER — DIPHENHYDRAMINE HCL 25 MG PO CAPS
ORAL_CAPSULE | ORAL | Status: AC
  Filled 2018-08-11: qty 2

## 2018-08-11 MED ORDER — DIPHENHYDRAMINE HCL 25 MG PO CAPS
50.0000 mg | ORAL_CAPSULE | Freq: Once | ORAL | Status: AC
  Administered 2018-08-11: 50 mg via ORAL

## 2018-08-11 MED ORDER — SODIUM CHLORIDE 0.9 % IV SOLN
375.0000 mg/m2 | Freq: Once | INTRAVENOUS | Status: AC
  Administered 2018-08-11: 600 mg via INTRAVENOUS
  Filled 2018-08-11: qty 50

## 2018-08-11 MED ORDER — DEXAMETHASONE SODIUM PHOSPHATE 10 MG/ML IJ SOLN
10.0000 mg | Freq: Once | INTRAMUSCULAR | Status: AC
  Administered 2018-08-11: 10:00:00 10 mg via INTRAVENOUS

## 2018-08-11 MED ORDER — VINCRISTINE SULFATE CHEMO INJECTION 1 MG/ML
1.0000 mg | Freq: Once | INTRAVENOUS | Status: AC
  Administered 2018-08-11: 1 mg via INTRAVENOUS
  Filled 2018-08-11: qty 1

## 2018-08-11 MED ORDER — SODIUM CHLORIDE 0.9 % IV SOLN
400.0000 mg/m2 | Freq: Once | INTRAVENOUS | Status: AC
  Administered 2018-08-11: 660 mg via INTRAVENOUS
  Filled 2018-08-11: qty 33

## 2018-08-11 MED ORDER — PEGFILGRASTIM 6 MG/0.6ML ~~LOC~~ PSKT
PREFILLED_SYRINGE | SUBCUTANEOUS | Status: AC
  Filled 2018-08-11: qty 0.6

## 2018-08-11 MED ORDER — DEXAMETHASONE SODIUM PHOSPHATE 10 MG/ML IJ SOLN
INTRAMUSCULAR | Status: AC
  Filled 2018-08-11: qty 1

## 2018-08-11 NOTE — Patient Instructions (Signed)

## 2018-08-11 NOTE — Patient Instructions (Signed)
[facility] Discharge Instructions for Patients Receiving Chemotherapy  Today you received the following chemotherapy agents Rituxan, Cytoxan, Adriamycin, Vincristine  To help prevent nausea and vomiting after your treatment, we encourage you to take your nausea medication    If you develop nausea and vomiting that is not controlled by your nausea medication, call the clinic.   BELOW ARE SYMPTOMS THAT SHOULD BE REPORTED IMMEDIATELY:  *FEVER GREATER THAN 100.5 F  *CHILLS WITH OR WITHOUT FEVER  NAUSEA AND VOMITING THAT IS NOT CONTROLLED WITH YOUR NAUSEA MEDICATION  *UNUSUAL SHORTNESS OF BREATH  *UNUSUAL BRUISING OR BLEEDING  TENDERNESS IN MOUTH AND THROAT WITH OR WITHOUT PRESENCE OF ULCERS  *URINARY PROBLEMS  *BOWEL PROBLEMS  UNUSUAL RASH Items with * indicate a potential emergency and should be followed up as soon as possible.  Feel free to call the clinic should you have any questions or concerns. The clinic phone number is [phone].  Please show the [facility] at check-in to the Emergency Department and triage nurse.

## 2018-08-11 NOTE — Telephone Encounter (Signed)
No change in appts per 6/9 los

## 2018-08-12 ENCOUNTER — Encounter: Payer: Self-pay | Admitting: Hematology

## 2018-08-13 ENCOUNTER — Other Ambulatory Visit: Payer: Self-pay | Admitting: Hematology

## 2018-08-13 DIAGNOSIS — G4700 Insomnia, unspecified: Secondary | ICD-10-CM

## 2018-08-13 MED ORDER — TRAZODONE HCL 50 MG PO TABS: 50.0000 mg | ORAL_TABLET | Freq: Every evening | ORAL | 1 refills | Status: DC | PRN

## 2018-08-14 DIAGNOSIS — Z7189 Other specified counseling: Secondary | ICD-10-CM | POA: Insufficient documentation

## 2018-08-17 ENCOUNTER — Encounter: Payer: Self-pay | Admitting: Hematology

## 2018-08-19 ENCOUNTER — Other Ambulatory Visit: Payer: Self-pay | Admitting: Hematology

## 2018-08-20 ENCOUNTER — Encounter: Payer: Self-pay | Admitting: Hematology

## 2018-08-20 ENCOUNTER — Other Ambulatory Visit: Payer: Self-pay | Admitting: Hematology

## 2018-08-20 DIAGNOSIS — R05 Cough: Secondary | ICD-10-CM

## 2018-08-20 DIAGNOSIS — R059 Cough, unspecified: Secondary | ICD-10-CM

## 2018-08-20 MED ORDER — AZITHROMYCIN 250 MG PO TABS: ORAL_TABLET | ORAL | 0 refills | Status: DC

## 2018-09-01 ENCOUNTER — Encounter: Payer: Self-pay | Admitting: *Deleted

## 2018-09-01 ENCOUNTER — Inpatient Hospital Stay: Payer: Medicare Other

## 2018-09-01 ENCOUNTER — Inpatient Hospital Stay (HOSPITAL_BASED_OUTPATIENT_CLINIC_OR_DEPARTMENT_OTHER): Payer: Medicare Other | Admitting: Hematology

## 2018-09-01 ENCOUNTER — Other Ambulatory Visit: Payer: Medicare Other

## 2018-09-01 ENCOUNTER — Encounter: Payer: Self-pay | Admitting: Hematology

## 2018-09-01 ENCOUNTER — Other Ambulatory Visit: Payer: Self-pay

## 2018-09-01 ENCOUNTER — Ambulatory Visit: Payer: Medicare Other | Admitting: Hematology

## 2018-09-01 VITALS — BP 132/62 | HR 85 | Resp 17

## 2018-09-01 VITALS — BP 137/72 | HR 67 | Temp 98.7°F | Resp 18 | Ht 61.0 in | Wt 141.0 lb

## 2018-09-01 DIAGNOSIS — F19982 Other psychoactive substance use, unspecified with psychoactive substance-induced sleep disorder: Secondary | ICD-10-CM

## 2018-09-01 DIAGNOSIS — N183 Chronic kidney disease, stage 3 unspecified: Secondary | ICD-10-CM

## 2018-09-01 DIAGNOSIS — D631 Anemia in chronic kidney disease: Secondary | ICD-10-CM | POA: Diagnosis not present

## 2018-09-01 DIAGNOSIS — R110 Nausea: Secondary | ICD-10-CM | POA: Diagnosis not present

## 2018-09-01 DIAGNOSIS — D649 Anemia, unspecified: Secondary | ICD-10-CM | POA: Diagnosis not present

## 2018-09-01 DIAGNOSIS — L659 Nonscarring hair loss, unspecified: Secondary | ICD-10-CM | POA: Diagnosis not present

## 2018-09-01 DIAGNOSIS — Z5112 Encounter for antineoplastic immunotherapy: Secondary | ICD-10-CM | POA: Diagnosis not present

## 2018-09-01 DIAGNOSIS — C8580 Other specified types of non-Hodgkin lymphoma, unspecified site: Secondary | ICD-10-CM

## 2018-09-01 DIAGNOSIS — G4700 Insomnia, unspecified: Secondary | ICD-10-CM | POA: Diagnosis not present

## 2018-09-01 DIAGNOSIS — K219 Gastro-esophageal reflux disease without esophagitis: Secondary | ICD-10-CM | POA: Diagnosis not present

## 2018-09-01 DIAGNOSIS — I10 Essential (primary) hypertension: Secondary | ICD-10-CM

## 2018-09-01 DIAGNOSIS — Z79899 Other long term (current) drug therapy: Secondary | ICD-10-CM

## 2018-09-01 DIAGNOSIS — Z5189 Encounter for other specified aftercare: Secondary | ICD-10-CM | POA: Diagnosis not present

## 2018-09-01 DIAGNOSIS — Z5111 Encounter for antineoplastic chemotherapy: Secondary | ICD-10-CM | POA: Diagnosis not present

## 2018-09-01 LAB — CMP (CANCER CENTER ONLY)
ALT: 10 U/L (ref 0–44)
AST: 14 U/L — ABNORMAL LOW (ref 15–41)
Albumin: 4.1 g/dL (ref 3.5–5.0)
Alkaline Phosphatase: 71 U/L (ref 38–126)
Anion gap: 8 (ref 5–15)
BUN: 24 mg/dL — ABNORMAL HIGH (ref 8–23)
CO2: 27 mmol/L (ref 22–32)
Calcium: 9.5 mg/dL (ref 8.9–10.3)
Chloride: 104 mmol/L (ref 98–111)
Creatinine: 0.94 mg/dL (ref 0.44–1.00)
GFR, Est AFR Am: >60 mL/min (ref >60)
GFR, Estimated: 58 mL/min — ABNORMAL LOW (ref >60)
Glucose, Bld: 96 mg/dL (ref 70–99)
Potassium: 4.4 mmol/L (ref 3.5–5.1)
Sodium: 139 mmol/L (ref 135–145)
Total Bilirubin: 0.3 mg/dL (ref 0.3–1.2)
Total Protein: 6 g/dL — ABNORMAL LOW (ref 6.5–8.1)

## 2018-09-01 LAB — CBC WITH DIFFERENTIAL (CANCER CENTER ONLY)
Abs Immature Granulocytes: 0.04 10*3/uL (ref 0.00–0.07)
Basophils Absolute: 0.1 10*3/uL (ref 0.0–0.1)
Basophils Relative: 2 %
Eosinophils Absolute: 0.1 10*3/uL (ref 0.0–0.5)
Eosinophils Relative: 1 %
HCT: 32.1 % — ABNORMAL LOW (ref 36.0–46.0)
Hemoglobin: 10.3 g/dL — ABNORMAL LOW (ref 12.0–15.0)
Immature Granulocytes: 1 %
Lymphocytes Relative: 13 %
Lymphs Abs: 0.8 10*3/uL (ref 0.7–4.0)
MCH: 27.8 pg (ref 26.0–34.0)
MCHC: 32.1 g/dL (ref 30.0–36.0)
MCV: 86.5 fL (ref 80.0–100.0)
Monocytes Absolute: 0.6 10*3/uL (ref 0.1–1.0)
Monocytes Relative: 10 %
Neutro Abs: 4.3 10*3/uL (ref 1.7–7.7)
Neutrophils Relative %: 73 %
Platelet Count: 284 10*3/uL (ref 150–400)
RBC: 3.71 MIL/uL — ABNORMAL LOW (ref 3.87–5.11)
RDW: 19.3 % — ABNORMAL HIGH (ref 11.5–15.5)
WBC Count: 5.9 10*3/uL (ref 4.0–10.5)
nRBC: 0 % (ref 0.0–0.2)

## 2018-09-01 LAB — MAGNESIUM: Magnesium: 1.9 mg/dL (ref 1.7–2.4)

## 2018-09-01 MED ORDER — HEPARIN SOD (PORK) LOCK FLUSH 100 UNIT/ML IV SOLN
500.0000 [IU] | Freq: Once | INTRAVENOUS | Status: AC | PRN
  Administered 2018-09-01: 500 [IU]
  Filled 2018-09-01: qty 5

## 2018-09-01 MED ORDER — PALONOSETRON HCL INJECTION 0.25 MG/5ML
INTRAVENOUS | Status: AC
  Filled 2018-09-01: qty 5

## 2018-09-01 MED ORDER — DOXORUBICIN HCL CHEMO IV INJECTION 2 MG/ML
25.0000 mg/m2 | Freq: Once | INTRAVENOUS | Status: AC
  Administered 2018-09-01: 42 mg via INTRAVENOUS
  Filled 2018-09-01: qty 21

## 2018-09-01 MED ORDER — SODIUM CHLORIDE 0.9 % IV SOLN
400.0000 mg/m2 | Freq: Once | INTRAVENOUS | Status: AC
  Administered 2018-09-01: 660 mg via INTRAVENOUS
  Filled 2018-09-01: qty 33

## 2018-09-01 MED ORDER — SODIUM CHLORIDE 0.9 % IV SOLN
375.0000 mg/m2 | Freq: Once | INTRAVENOUS | Status: AC
  Administered 2018-09-01: 600 mg via INTRAVENOUS
  Filled 2018-09-01: qty 50

## 2018-09-01 MED ORDER — DEXAMETHASONE SODIUM PHOSPHATE 10 MG/ML IJ SOLN
INTRAMUSCULAR | Status: AC
  Filled 2018-09-01: qty 1

## 2018-09-01 MED ORDER — DIPHENHYDRAMINE HCL 25 MG PO CAPS
ORAL_CAPSULE | ORAL | Status: AC
  Filled 2018-09-01: qty 2

## 2018-09-01 MED ORDER — DEXAMETHASONE SODIUM PHOSPHATE 10 MG/ML IJ SOLN
10.0000 mg | Freq: Once | INTRAMUSCULAR | Status: AC
  Administered 2018-09-01: 10 mg via INTRAVENOUS

## 2018-09-01 MED ORDER — VINCRISTINE SULFATE CHEMO INJECTION 1 MG/ML
1.0000 mg | Freq: Once | INTRAVENOUS | Status: AC
  Administered 2018-09-01: 1 mg via INTRAVENOUS
  Filled 2018-09-01: qty 1

## 2018-09-01 MED ORDER — PALONOSETRON HCL INJECTION 0.25 MG/5ML
0.2500 mg | Freq: Once | INTRAVENOUS | Status: AC
  Administered 2018-09-01: 0.25 mg via INTRAVENOUS

## 2018-09-01 MED ORDER — ACETAMINOPHEN 325 MG PO TABS
ORAL_TABLET | ORAL | Status: AC
  Filled 2018-09-01: qty 2

## 2018-09-01 MED ORDER — PEGFILGRASTIM 6 MG/0.6ML ~~LOC~~ PSKT
6.0000 mg | PREFILLED_SYRINGE | Freq: Once | SUBCUTANEOUS | Status: AC
  Administered 2018-09-01: 6 mg via SUBCUTANEOUS

## 2018-09-01 MED ORDER — ACETAMINOPHEN 325 MG PO TABS
650.0000 mg | ORAL_TABLET | Freq: Once | ORAL | Status: AC
  Administered 2018-09-01: 650 mg via ORAL

## 2018-09-01 MED ORDER — SODIUM CHLORIDE 0.9 % IV SOLN
Freq: Once | INTRAVENOUS | Status: AC
  Administered 2018-09-01: 10:00:00 via INTRAVENOUS
  Filled 2018-09-01: qty 250

## 2018-09-01 MED ORDER — SODIUM CHLORIDE 0.9% FLUSH
10.0000 mL | INTRAVENOUS | Status: DC | PRN
  Administered 2018-09-01: 10 mL
  Filled 2018-09-01: qty 10

## 2018-09-01 MED ORDER — PEGFILGRASTIM 6 MG/0.6ML ~~LOC~~ PSKT
PREFILLED_SYRINGE | SUBCUTANEOUS | Status: AC
  Filled 2018-09-01: qty 0.6

## 2018-09-01 MED ORDER — SODIUM CHLORIDE 0.9% FLUSH
10.0000 mL | Freq: Once | INTRAVENOUS | Status: AC
  Administered 2018-09-01: 09:00:00 10 mL
  Filled 2018-09-01: qty 10

## 2018-09-01 MED ORDER — DIPHENHYDRAMINE HCL 25 MG PO CAPS
50.0000 mg | ORAL_CAPSULE | Freq: Once | ORAL | Status: AC
  Administered 2018-09-01: 50 mg via ORAL

## 2018-09-01 NOTE — Progress Notes (Signed)
[place] OFFICE PROGRESS NOTE  Patient Care Team: Carollee Herter, Alferd Apa, DO as PCP - General (Family Medicine) Jari Pigg, MD as Consulting Physician (Dermatology) Monna Fam, MD as Consulting Physician (Ophthalmology) Terrance Mass, MD (Inactive) as Consulting Physician (Gynecology) Cordelia Poche, RN as Oncology Nurse Navigator Tish Men, MD as Medical Oncologist (Hematology)  HEME/ONC OVERVIEW: 1. Stage IV nodal marginal zone lymphoma  -05/2018:  ? CT chest showed pathologic lymphadenopathy involving bilateral axilla, mediastinum as well as multiple small lung nodules and possible splenomegaly  ? PET showed FDG-avid lymphadenopathy in neck, chest, abdomen and pelvis, questionable involvement of the lung, and focal involvement of left scapula and acetabulum  ? Left axillary LN excisional bx: NHL, favoring low-grade marginal zone lymphoma; Cyclin-D1 neg ([facility]) -06/2018: admitted for AKI secondary to hypercalcemia (Ca 13); LVEF normal  -07/2018 - present: mini-R-CHOP with Onpro   TREATMENT REGIMEN:  07/21/2018 - present: mini-R-CHOP with Onpro; plan for 6 cycles    ASSESSMENT & PLAN:    Stage IV nodal marginal zone lymphoma  -S/p 2 cycle of mini-R-CHOP  -Patient is tolerating treatment relatively well so far  -Labs adequate, proceed with Cycle 3 of chemotherapy  -To assess disease response, I have ordered PET after the 3rd cycle of chemotherapy  -Prophylaxis: acyclovir, allopurinol -PRN anti-emetics: Zofran and Compazine    Normocytic anemia -Multifactorial, including bone marrow involvement by lymphoma and chemotherapy -Hgb 10.3 today, stable -Patient denies any symptoms of bleeding  -We will monitor it for now     Chemotherapy-associated nausea  -Secondary to chemotherapy -Intermittent nausea without vomiting, controlled with PRN Zofran  -Continue PRN-anti-emetics   Insomnia -Secondary to high-dose steroid -Patient was prescribed trazodone  but has not yet needed it -If she continues to have persistent insomnia, we can consider reducing the dose of the steroid to 44m with future cycles   Stage III CKD -Baseline Cr ~1.1 -Cr ___ today, stable; electrolytes normal -I counseled the patient on the importance of maintaining adequate hydration -We will monitor it for now   Orders Placed This Encounter  Procedures  . NM PET Image Restag (PS) Skull Base To Thigh    Standing Status:   Future    Standing Expiration Date:   09/01/2019    Order Specific Question:   ** REASON FOR EXAM (FREE TEXT)    Answer:   Marginal zone lymphoma s/p 3 cycles of chemo, assess interim response    Order Specific Question:   If indicated for the ordered procedure, I authorize the administration of a radiopharmaceutical per Radiology protocol    Answer:   Yes    Order Specific Question:   [facility]    [facility]   Order Specific Question:   Radiology Contrast Protocol - do NOT remove file path    Answer:   [email or web address]    All questions were answered. The patient knows to call the clinic with any problems, questions or concerns. No barriers to learning was detected.  Return in 3 weeks for labs, port flush, clinic appt, PET results and Cycle 4 of chemotherapy.   YTish Men MD 09/01/2018 9:52 AM  CHIEF COMPLAINT: "I am doing okay"  INTERVAL HISTORY: Amanda [name] to clinic for follow-up of Stage IV marginal zone lymphoma on mini-R-CHOP.  Patient reports that he tolerated last cycle of chemotherapy well, except occasional nausea, for which he takes Zofran with relief of the symptoms.  She denies any vomiting.  She has some insomnia with the high-dose steroid after the second cycle of chemotherapy, and was prescribed trazodone, but has not yet had to take it.  Her appetite is good, and she is gaining some weight.  She reports occasional itching in her scalp, in her groin, and under  the breasts, but denies any noticeable skin lesion.  She has not yet had to take any medication for itching.  She denies any other complaint today.  SUMMARY OF ONCOLOGIC HISTORY: Oncology History  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[place])  05/21/2018 Initial Diagnosis   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[place])   07/21/2018 -  Chemotherapy   The patient had DOXOrubicin (ADRIAMYCIN) chemo injection 42 mg, 25 mg/m2 = 42 mg (100 % of original dose 25 mg/m2), Intravenous,  Once, 3 of 6 cycles Dose modification: 25 mg/m2 (original dose 25 mg/m2, Cycle 1, Reason: Patient Age) Administration: 42 mg (07/21/2018), 42 mg (08/11/2018) palonosetron (ALOXI) injection 0.25 mg, 0.25 mg, Intravenous,  Once, 3 of 6 cycles Administration: 0.25 mg (07/21/2018), 0.25 mg (08/11/2018) pegfilgrastim (NEULASTA ONPRO KIT) injection 6 mg, 6 mg, Subcutaneous, Once, 3 of 6 cycles Administration: 6 mg (07/21/2018), 6 mg (08/11/2018) vinCRIStine (ONCOVIN) 1 mg in sodium chloride 0.9 % 50 mL chemo infusion, 1 mg (100 % of original dose 1 mg), Intravenous,  Once, 3 of 6 cycles Dose modification: 1 mg (original dose 1 mg, Cycle 1, Reason: Patient Age) Administration: 1 mg (07/21/2018), 1 mg (08/11/2018) riTUXimab (RITUXAN) 600 mg in sodium chloride 0.9 % 250 mL (1.9355 mg/mL) infusion, 375 mg/m2 = 600 mg, Intravenous,  Once, 1 of 1 cycle Administration: 600 mg (07/21/2018) cyclophosphamide (CYTOXAN) 660 mg in sodium chloride 0.9 % 250 mL chemo infusion, 400 mg/m2 = 660 mg (100 % of original dose 400 mg/m2), Intravenous,  Once, 3 of 6 cycles Dose modification: 400 mg/m2 (original dose 400 mg/m2, Cycle 1, Reason: Patient Age) Administration: 660 mg (07/21/2018), 660 mg (08/11/2018) riTUXimab (RITUXAN) 600 mg in sodium chloride 0.9 % 190 mL infusion, 375 mg/m2 = 600 mg (100 % of original dose 375 mg/m2), Intravenous,  Once, 2 of 5 cycles Dose modification: 375 mg/m2 (original dose 375 mg/m2, Cycle 2) Administration: 600 mg (08/11/2018)  for chemotherapy treatment.       REVIEW OF SYSTEMS:   Constitutional: ( - ) fevers, ( - )  chills , ( - ) night sweats Eyes: ( - ) blurriness of vision, ( - ) double vision, ( - ) watery eyes Ears, nose, mouth, throat, and face: ( - ) mucositis, ( - ) sore throat Respiratory: ( - ) cough, ( - ) dyspnea, ( - ) wheezes Cardiovascular: ( - ) palpitation, ( - ) chest discomfort, ( - ) lower extremity swelling Gastrointestinal:  ( + ) nausea, ( - ) heartburn, ( - ) change in bowel habits Skin: ( - ) abnormal skin rashes Lymphatics: ( - ) new lymphadenopathy, ( - ) easy bruising Neurological: ( - ) numbness, ( - ) tingling, ( - ) new weaknesses Behavioral/Psych: ( - ) mood change, ( - ) new changes  All other systems were reviewed with the patient and are negative.  I have reviewed the past medical history, past surgical history, social history and family history with the patient and they are unchanged from previous note.  ALLERGIES:  is allergic to phenergan [promethazine hcl]; levofloxacin; and pravastatin sodium.  MEDICATIONS:  Current Outpatient Medications  Medication Sig Dispense Refill  . acetaminophen (TYLENOL) 500 MG tablet Take 1,000 mg by  mouth every 6 (six) hours as needed for mild pain.    [name] albuterol (PROVENTIL) (2.5 MG/3ML) 0.083% nebulizer solution Take 3 mLs (2.5 mg total) by nebulization every 6 (six) hours as needed for wheezing or shortness of breath. 75 mL 12  . allopurinol (ZYLOPRIM) 300 MG tablet Take 1 tablet (300 mg total) by mouth daily. 30 tablet 3  . amLODipine (NORVASC) 5 MG tablet Take 1 tablet (5 mg total) by mouth daily with breakfast. 90 tablet 1  . buPROPion (WELLBUTRIN XL) 150 MG 24 hr tablet TAKE 1 TABLET BY MOUTH EVERY DAY 90 tablet 1  . famotidine (PEPCID) 20 MG tablet One at bedtime (Patient taking differently: Take 20 mg by mouth at bedtime. One at bedtime ) 30 tablet 11  . feeding supplement, ENSURE ENLIVE, (ENSURE ENLIVE) LIQD Take 237 mLs by mouth 2 (two) times daily between  meals. 60 Bottle 0  . levothyroxine (SYNTHROID) 125 MCG tablet TAKE 1 TABLET BY MOUTH DAILY 90 tablet 0  . lidocaine-prilocaine (EMLA) cream Apply to affected area once 30 g 3  . Omega-3 Fatty Acids (OMEGA 3 PO) Take by mouth.    [name] omeprazole (PRILOSEC) 20 MG capsule Take 2 x 30 min before breakfast (Patient taking differently: Take 20 mg by mouth daily. )    . ondansetron (ZOFRAN) 8 MG tablet Take 1 tablet (8 mg total) by mouth 2 (two) times daily as needed for refractory nausea / vomiting. Start on day 3 after cyclophosphamide chemotherapy. 30 tablet 1  . predniSONE (DELTASONE) 20 MG tablet TK 3 AND 1/2 TS PO D FOR 5 DAYS. TK ON DAYS 1 THROUGH 5 OF CHEMOTHERAPY    . prochlorperazine (COMPAZINE) 10 MG tablet Take 1 tablet (10 mg total) by mouth every 6 (six) hours as needed (Nausea or vomiting). 30 tablet 6  . traZODone (DESYREL) 50 MG tablet Take 1 tablet (50 mg total) by mouth at bedtime as needed for sleep. 30 tablet 1  . magic mouthwash w/lidocaine SOLN Take 5 mLs by mouth 4 (four) times daily as needed for mouth pain. Swish and Spit (Patient not taking: Reported on 09/01/2018) 240 mL 0   No current facility-administered medications for this visit.    Facility-Administered Medications Ordered in Other Visits  Medication Dose Route Frequency Provider Last Rate Last Dose  . acetaminophen (TYLENOL) tablet 650 mg  650 mg Oral Once Tish Men, MD      . dexamethasone (DECADRON) injection 10 mg  10 mg Intravenous Once Tish Men, MD      . diphenhydrAMINE (BENADRYL) capsule 50 mg  50 mg Oral Once Tish Men, MD      . heparin lock flush 100 unit/mL  500 Units Intracatheter Once PRN Tish Men, MD      . palonosetron (ALOXI) injection 0.25 mg  0.25 mg Intravenous Once Tish Men, MD      . sodium chloride flush (NS) 0.9 % injection 10 mL  10 mL Intracatheter PRN Tish Men, MD        PHYSICAL EXAMINATION: ECOG PERFORMANCE STATUS: 1 - Symptomatic but completely ambulatory  Today's Vitals   09/01/18  0923  BP: 137/72  Pulse: 67  Resp: 18  Temp: 98.7 F (37.1 C)  TempSrc: Oral  SpO2: 99%  Weight: 141 lb (64 kg)  Height: _0  (1.549 m)  PainSc: 0-No pain   Body mass index is 26.64 kg/m.  Filed Weights   09/01/18 0923  Weight: 141 lb (64 kg)    GENERAL:  alert, no distress and comfortable SKIN: skin color, texture, turgor are normal, no rashes or significant lesions EYES: conjunctiva are pink and non-injected, sclera clear OROPHARYNX: no exudate, no erythema; lips, buccal mucosa, and tongue normal  NECK: supple, non-tender LUNGS: clear to auscultation with normal breathing effort HEART: regular rate & rhythm and no murmurs and no lower extremity edema ABDOMEN: soft, non-tender, non-distended, normal bowel sounds Musculoskeletal: no cyanosis of digits and no clubbing  PSYCH: alert & oriented x 3, fluent speech NEURO: no focal motor/sensory deficits  LABORATORY DATA:  I have reviewed the data as listed    Component Value Date/Time   NA 139 09/01/2018 0855   NA 129 (A) 06/23/2018   K 4.4 09/01/2018 0855   CL 104 09/01/2018 0855   CO2 27 09/01/2018 0855   GLUCOSE 96 09/01/2018 0855   BUN 24 (H) 09/01/2018 0855   BUN 14 06/23/2018   CREATININE 0.94 09/01/2018 0855   CREATININE 1.36 (H) 10/27/2015 1530   CALCIUM 9.5 09/01/2018 0855   CALCIUM 12.3 (H) 06/16/2018 1639   PROT 6.0 (L) 09/01/2018 0855   ALBUMIN 4.1 09/01/2018 0855   AST 14 (L) 09/01/2018 0855   ALT 10 09/01/2018 0855   ALKPHOS 71 09/01/2018 0855   BILITOT 0.3 09/01/2018 0855   GFRNONAA 58 (L) 09/01/2018 0855   GFRAA >60 09/01/2018 0855    No results found for: SPEP, UPEP  Lab Results  Component Value Date   WBC 5.9 09/01/2018   NEUTROABS 4.3 09/01/2018   HGB 10.3 (L) 09/01/2018   HCT 32.1 (L) 09/01/2018   MCV 86.5 09/01/2018   PLT 284 09/01/2018      Chemistry      Component Value Date/Time   NA 139 09/01/2018 0855   NA 129 (A) 06/23/2018   K 4.4 09/01/2018 0855   CL 104 09/01/2018  0855   CO2 27 09/01/2018 0855   BUN 24 (H) 09/01/2018 0855   BUN 14 06/23/2018   CREATININE 0.94 09/01/2018 0855   CREATININE 1.36 (H) 10/27/2015 1530   GLU 91 06/23/2018      Component Value Date/Time   CALCIUM 9.5 09/01/2018 0855   CALCIUM 12.3 (H) 06/16/2018 1639   ALKPHOS 71 09/01/2018 0855   AST 14 (L) 09/01/2018 0855   ALT 10 09/01/2018 0855   BILITOT 0.3 09/01/2018 0855

## 2018-09-01 NOTE — Patient Instructions (Signed)

## 2018-09-03 ENCOUNTER — Telehealth: Payer: Self-pay

## 2018-09-03 NOTE — Telephone Encounter (Signed)
Copied from [place] [id number]. Topic: General - Inquiry >> Sep 03, 2018  3:19 PM Percell Belt A wrote: Reason for CRM: pt called in and stated that she just had her last kemo treatment and she said she was told from her report that she has had stage 3 kidney disease.  She stated that was the 1st she heard of that and would like to talk with nurse about that.  Please advise Best number  [phone]

## 2018-09-03 NOTE — Telephone Encounter (Signed)
Please advise 

## 2018-09-04 ENCOUNTER — Other Ambulatory Visit: Payer: Self-pay | Admitting: Family Medicine

## 2018-09-04 DIAGNOSIS — N183 Chronic kidney disease, stage 3 unspecified: Secondary | ICD-10-CM

## 2018-09-04 NOTE — Telephone Encounter (Signed)
Her kidney function has been up and down over the last few months---- labs done 3 days ago it was normal  But prior to that creatinine slightly elevated We can refer her to a nephrologist to have them watch over it

## 2018-09-07 ENCOUNTER — Encounter: Payer: Self-pay | Admitting: Hematology

## 2018-09-08 NOTE — Telephone Encounter (Signed)
Pt notified. Referral placed on 09/04/2018

## 2018-09-09 ENCOUNTER — Other Ambulatory Visit: Payer: Self-pay | Admitting: Hematology

## 2018-09-09 ENCOUNTER — Encounter: Payer: Self-pay | Admitting: Hematology

## 2018-09-09 DIAGNOSIS — R05 Cough: Secondary | ICD-10-CM

## 2018-09-09 DIAGNOSIS — R059 Cough, unspecified: Secondary | ICD-10-CM

## 2018-09-09 MED ORDER — AZITHROMYCIN 250 MG PO TABS: ORAL_TABLET | ORAL | 0 refills | Status: DC

## 2018-09-09 MED ORDER — AZITHROMYCIN 250 MG PO TABS: 250.0000 mg | ORAL_TABLET | Freq: Every day | ORAL | Status: DC

## 2018-09-09 MED ORDER — AZITHROMYCIN 250 MG PO TABS: 500.0000 mg | ORAL_TABLET | Freq: Every day | ORAL | Status: DC

## 2018-09-09 NOTE — Progress Notes (Unsigned)
Pack -p

## 2018-09-14 DIAGNOSIS — Z961 Presence of intraocular lens: Secondary | ICD-10-CM | POA: Diagnosis not present

## 2018-09-14 DIAGNOSIS — H40013 Open angle with borderline findings, low risk, bilateral: Secondary | ICD-10-CM | POA: Diagnosis not present

## 2018-09-14 DIAGNOSIS — H35033 Hypertensive retinopathy, bilateral: Secondary | ICD-10-CM | POA: Diagnosis not present

## 2018-09-14 DIAGNOSIS — H353132 Nonexudative age-related macular degeneration, bilateral, intermediate dry stage: Secondary | ICD-10-CM | POA: Diagnosis not present

## 2018-09-16 ENCOUNTER — Other Ambulatory Visit: Payer: Self-pay

## 2018-09-16 ENCOUNTER — Ambulatory Visit (HOSPITAL_COMMUNITY)
Admission: RE | Admit: 2018-09-16 | Discharge: 2018-09-16 | Disposition: A | Discharge status: Home / Self Care | Payer: Medicare Other | Source: Ambulatory Visit | Attending: Hematology | Admitting: Hematology

## 2018-09-16 DIAGNOSIS — R911 Solitary pulmonary nodule: Secondary | ICD-10-CM | POA: Insufficient documentation

## 2018-09-16 DIAGNOSIS — C8580 Other specified types of non-Hodgkin lymphoma, unspecified site: Secondary | ICD-10-CM | POA: Diagnosis not present

## 2018-09-16 DIAGNOSIS — Z79899 Other long term (current) drug therapy: Secondary | ICD-10-CM | POA: Insufficient documentation

## 2018-09-16 DIAGNOSIS — C8590 Non-Hodgkin lymphoma, unspecified, unspecified site: Secondary | ICD-10-CM | POA: Diagnosis not present

## 2018-09-16 DIAGNOSIS — I700 Atherosclerosis of aorta: Secondary | ICD-10-CM | POA: Diagnosis not present

## 2018-09-16 LAB — GLUCOSE, CAPILLARY: Glucose-Capillary: 95 mg/dL (ref 70–99)

## 2018-09-16 MED ORDER — FLUDEOXYGLUCOSE F - 18 (FDG) INJECTION
6.9000 | Freq: Once | INTRAVENOUS | Status: AC | PRN
  Administered 2018-09-16: 6.9 via INTRAVENOUS

## 2018-09-17 ENCOUNTER — Encounter: Payer: Self-pay | Admitting: Hematology

## 2018-09-22 ENCOUNTER — Inpatient Hospital Stay: Payer: Medicare Other | Attending: Hematology

## 2018-09-22 ENCOUNTER — Inpatient Hospital Stay: Payer: Medicare Other

## 2018-09-22 ENCOUNTER — Telehealth: Payer: Self-pay | Admitting: Hematology

## 2018-09-22 ENCOUNTER — Other Ambulatory Visit: Payer: Self-pay

## 2018-09-22 ENCOUNTER — Encounter: Payer: Self-pay | Admitting: Hematology

## 2018-09-22 ENCOUNTER — Inpatient Hospital Stay (HOSPITAL_BASED_OUTPATIENT_CLINIC_OR_DEPARTMENT_OTHER): Payer: Medicare Other | Admitting: Hematology

## 2018-09-22 VITALS — BP 134/62 | HR 68 | Temp 97.6°F | Resp 19

## 2018-09-22 DIAGNOSIS — D701 Agranulocytosis secondary to cancer chemotherapy: Secondary | ICD-10-CM | POA: Insufficient documentation

## 2018-09-22 DIAGNOSIS — R110 Nausea: Secondary | ICD-10-CM | POA: Diagnosis not present

## 2018-09-22 DIAGNOSIS — D631 Anemia in chronic kidney disease: Secondary | ICD-10-CM | POA: Insufficient documentation

## 2018-09-22 DIAGNOSIS — T451X5A Adverse effect of antineoplastic and immunosuppressive drugs, initial encounter: Secondary | ICD-10-CM

## 2018-09-22 DIAGNOSIS — Z5189 Encounter for other specified aftercare: Secondary | ICD-10-CM | POA: Diagnosis not present

## 2018-09-22 DIAGNOSIS — Z79899 Other long term (current) drug therapy: Secondary | ICD-10-CM | POA: Insufficient documentation

## 2018-09-22 DIAGNOSIS — C8580 Other specified types of non-Hodgkin lymphoma, unspecified site: Secondary | ICD-10-CM

## 2018-09-22 DIAGNOSIS — Z5111 Encounter for antineoplastic chemotherapy: Secondary | ICD-10-CM | POA: Insufficient documentation

## 2018-09-22 DIAGNOSIS — I10 Essential (primary) hypertension: Secondary | ICD-10-CM | POA: Insufficient documentation

## 2018-09-22 DIAGNOSIS — D649 Anemia, unspecified: Secondary | ICD-10-CM

## 2018-09-22 DIAGNOSIS — N183 Chronic kidney disease, stage 3 unspecified: Secondary | ICD-10-CM

## 2018-09-22 DIAGNOSIS — Z5112 Encounter for antineoplastic immunotherapy: Secondary | ICD-10-CM | POA: Diagnosis not present

## 2018-09-22 DIAGNOSIS — G4700 Insomnia, unspecified: Secondary | ICD-10-CM

## 2018-09-22 DIAGNOSIS — F19982 Other psychoactive substance use, unspecified with psychoactive substance-induced sleep disorder: Secondary | ICD-10-CM

## 2018-09-22 LAB — CBC WITH DIFFERENTIAL (CANCER CENTER ONLY)
Abs Immature Granulocytes: 0.01 10*3/uL (ref 0.00–0.07)
Basophils Absolute: 0.1 10*3/uL (ref 0.0–0.1)
Basophils Relative: 3 %
Eosinophils Absolute: 0.2 10*3/uL (ref 0.0–0.5)
Eosinophils Relative: 6 %
HCT: 32.2 % — ABNORMAL LOW (ref 36.0–46.0)
Hemoglobin: 10.4 g/dL — ABNORMAL LOW (ref 12.0–15.0)
Immature Granulocytes: 0 %
Lymphocytes Relative: 21 %
Lymphs Abs: 0.6 10*3/uL — ABNORMAL LOW (ref 0.7–4.0)
MCH: 28.3 pg (ref 26.0–34.0)
MCHC: 32.3 g/dL (ref 30.0–36.0)
MCV: 87.5 fL (ref 80.0–100.0)
Monocytes Absolute: 0.7 10*3/uL (ref 0.1–1.0)
Monocytes Relative: 25 %
Neutro Abs: 1.3 10*3/uL — ABNORMAL LOW (ref 1.7–7.7)
Neutrophils Relative %: 45 %
Platelet Count: 265 10*3/uL (ref 150–400)
RBC: 3.68 MIL/uL — ABNORMAL LOW (ref 3.87–5.11)
RDW: 17.4 % — ABNORMAL HIGH (ref 11.5–15.5)
WBC Count: 2.7 10*3/uL — ABNORMAL LOW (ref 4.0–10.5)
nRBC: 0 % (ref 0.0–0.2)

## 2018-09-22 LAB — CMP (CANCER CENTER ONLY)
ALT: 10 U/L (ref 0–44)
AST: 13 U/L — ABNORMAL LOW (ref 15–41)
Albumin: 3.9 g/dL (ref 3.5–5.0)
Alkaline Phosphatase: 72 U/L (ref 38–126)
Anion gap: 9 (ref 5–15)
BUN: 16 mg/dL (ref 8–23)
CO2: 27 mmol/L (ref 22–32)
Calcium: 8.3 mg/dL — ABNORMAL LOW (ref 8.9–10.3)
Chloride: 107 mmol/L (ref 98–111)
Creatinine: 0.96 mg/dL (ref 0.44–1.00)
GFR, Est AFR Am: >60 mL/min (ref >60)
GFR, Estimated: 56 mL/min — ABNORMAL LOW (ref >60)
Glucose, Bld: 115 mg/dL — ABNORMAL HIGH (ref 70–99)
Potassium: 4 mmol/L (ref 3.5–5.1)
Sodium: 143 mmol/L (ref 135–145)
Total Bilirubin: 0.3 mg/dL (ref 0.3–1.2)
Total Protein: 5.4 g/dL — ABNORMAL LOW (ref 6.5–8.1)

## 2018-09-22 LAB — MAGNESIUM: Magnesium: 1.9 mg/dL (ref 1.7–2.4)

## 2018-09-22 MED ORDER — PALONOSETRON HCL INJECTION 0.25 MG/5ML
INTRAVENOUS | Status: AC
  Filled 2018-09-22: qty 5

## 2018-09-22 MED ORDER — VINCRISTINE SULFATE CHEMO INJECTION 1 MG/ML
1.0000 mg | Freq: Once | INTRAVENOUS | Status: AC
  Administered 2018-09-22: 1 mg via INTRAVENOUS
  Filled 2018-09-22: qty 1

## 2018-09-22 MED ORDER — SODIUM CHLORIDE 0.9 % IV SOLN
Freq: Once | INTRAVENOUS | Status: AC
  Administered 2018-09-22: 10:00:00 via INTRAVENOUS
  Filled 2018-09-22: qty 250

## 2018-09-22 MED ORDER — DIPHENHYDRAMINE HCL 25 MG PO CAPS
ORAL_CAPSULE | ORAL | Status: AC
  Filled 2018-09-22: qty 1

## 2018-09-22 MED ORDER — DOXORUBICIN HCL CHEMO IV INJECTION 2 MG/ML
25.0000 mg/m2 | Freq: Once | INTRAVENOUS | Status: AC
  Administered 2018-09-22: 42 mg via INTRAVENOUS
  Filled 2018-09-22: qty 21

## 2018-09-22 MED ORDER — DEXAMETHASONE SODIUM PHOSPHATE 10 MG/ML IJ SOLN
10.0000 mg | Freq: Once | INTRAMUSCULAR | Status: AC
  Administered 2018-09-22: 10:00:00 10 mg via INTRAVENOUS

## 2018-09-22 MED ORDER — HEPARIN SOD (PORK) LOCK FLUSH 100 UNIT/ML IV SOLN
500.0000 [IU] | Freq: Once | INTRAVENOUS | Status: AC | PRN
  Administered 2018-09-22: 500 [IU]
  Filled 2018-09-22: qty 5

## 2018-09-22 MED ORDER — ACETAMINOPHEN 325 MG PO TABS
650.0000 mg | ORAL_TABLET | Freq: Once | ORAL | Status: AC
  Administered 2018-09-22: 650 mg via ORAL

## 2018-09-22 MED ORDER — PALONOSETRON HCL INJECTION 0.25 MG/5ML
0.2500 mg | Freq: Once | INTRAVENOUS | Status: AC
  Administered 2018-09-22: 0.25 mg via INTRAVENOUS

## 2018-09-22 MED ORDER — SODIUM CHLORIDE 0.9 % IV SOLN
375.0000 mg/m2 | Freq: Once | INTRAVENOUS | Status: AC
  Administered 2018-09-22: 600 mg via INTRAVENOUS
  Filled 2018-09-22: qty 50

## 2018-09-22 MED ORDER — ACETAMINOPHEN 325 MG PO TABS
ORAL_TABLET | ORAL | Status: AC
  Filled 2018-09-22: qty 2

## 2018-09-22 MED ORDER — DEXAMETHASONE SODIUM PHOSPHATE 10 MG/ML IJ SOLN
INTRAMUSCULAR | Status: AC
  Filled 2018-09-22: qty 1

## 2018-09-22 MED ORDER — PEGFILGRASTIM 6 MG/0.6ML ~~LOC~~ PSKT
PREFILLED_SYRINGE | SUBCUTANEOUS | Status: AC
  Filled 2018-09-22: qty 0.6

## 2018-09-22 MED ORDER — SODIUM CHLORIDE 0.9% FLUSH
10.0000 mL | INTRAVENOUS | Status: DC | PRN
  Administered 2018-09-22: 10 mL
  Filled 2018-09-22: qty 10

## 2018-09-22 MED ORDER — PEGFILGRASTIM 6 MG/0.6ML ~~LOC~~ PSKT
6.0000 mg | PREFILLED_SYRINGE | Freq: Once | SUBCUTANEOUS | Status: AC
  Administered 2018-09-22: 6 mg via SUBCUTANEOUS

## 2018-09-22 MED ORDER — SODIUM CHLORIDE 0.9 % IV SOLN
400.0000 mg/m2 | Freq: Once | INTRAVENOUS | Status: AC
  Administered 2018-09-22: 660 mg via INTRAVENOUS
  Filled 2018-09-22: qty 33

## 2018-09-22 MED ORDER — DIPHENHYDRAMINE HCL 25 MG PO CAPS
50.0000 mg | ORAL_CAPSULE | Freq: Once | ORAL | Status: AC
  Administered 2018-09-22: 50 mg via ORAL

## 2018-09-22 MED ORDER — DIPHENHYDRAMINE HCL 25 MG PO CAPS
ORAL_CAPSULE | ORAL | Status: AC
  Filled 2018-09-22: qty 2

## 2018-09-22 NOTE — Progress Notes (Signed)
[facility] OFFICE PROGRESS NOTE  Patient Care Team: Carollee Herter, Alferd Apa, DO as PCP - General (Family Medicine) Jari Pigg, MD as Consulting Physician (Dermatology) Monna Fam, MD as Consulting Physician (Ophthalmology) Terrance Mass, MD (Inactive) as Consulting Physician (Gynecology) Cordelia Poche, RN as Oncology Nurse Navigator Tish Men, MD as Medical Oncologist (Hematology)  HEME/ONC OVERVIEW: 1. Stage IV nodal marginal zone lymphoma  -05/2018:  ? CT chest showed pathologic lymphadenopathy involving bilateral axilla, mediastinum as well as multiple small lung nodules and possible splenomegaly  ? PET showed FDG-avid lymphadenopathy in neck, chest, abdomen and pelvis, questionable involvement of the lung, and focal involvement of left scapula and acetabulum  ? Left axillary LN excisional bx: NHL, favoring low-grade marginal zone lymphoma; Cyclin-D1 neg ([facility]) -06/2018: admitted for AKI secondary to hypercalcemia (Ca 13); LVEF normal  -07/2018 - present: mini-R-CHOP with Onpro  Interim PET (after 3 cycles) showed resolution of nearly all FDG avidity within previously involved LN's (Deauville criteria 2 and 3); no residual or new disease    TREATMENT REGIMEN:  07/21/2018 - present: mini-R-CHOP with Onpro; plan for 6 cycles    ASSESSMENT & PLAN:    Stage IV nodal marginal zone lymphoma  -S/p 3 cycle of mini-R-CHOP  -I independently reviewed the radiologic images of PET, and agree with findings as documented -In summary, PET after 3 cycles of mini-R-CHOP showed resolution of nearly all FDG avidity within previously involved LN's (Deauville criteria 2 and 3). There was no residual or new disease.  -I discussed the findings in detail with the patient -Given the excellent response, I would recommend proceeding with 3 more cycles for a total of 6 treatments -Labs adequate, proceed with Cycle 4 of chemotherapy  -Prophylaxis: acyclovir, allopurinol -PRN  anti-emetics: Zofran and Compazine    Normocytic anemia -Multifactorial, including bone marrow involvement by lymphoma and chemotherapy -Hgb 10.4today, stable -Patient denies any symptoms of bleeding  -We will monitor it for now   Chemotherapy-associated leukopenia -Secondary to chemotherapy -WBC 2.7k with ANC 1400, lower than the last visit  -Patient denies any symptoms of infection -We will monitor for now; no indication for dose adjustment -If leukopenia worsens in the future, we will consider delaying chemotherapy or adjusting chemotherapy dose    Chemotherapy-associated nausea  -Secondary to chemotherapy -Intermittent nausea without vomiting, controlled with PRN Zofran  -Continue PRN-anti-emetics   Insomnia -Secondary to high-dose steroid -Patient was prescribed trazodone but has not yet needed it -Continue PRN trazodone for now   Stage III CKD -Baseline Cr ~1.1 -Cr 0.96 today, improving; electrolytes normal -I counseled the patient on the importance of maintaining adequate hydration and avoid NSAIDs  -We will monitor it for now   No orders of the defined types were placed in this encounter.   All questions were answered. The patient knows to call the clinic with any problems, questions or concerns. No barriers to learning was detected.  Return in 3 weeks for labs, port flush, clinic appt and Cycle 5 of chemotherapy.  Tish Men, MD 09/22/2018 10:21 AM  CHIEF COMPLAINT: "I am doing fine"  INTERVAL HISTORY: Amanda [name] returns to clinic for follow-up of marginal zone lymphoma on mini-R-CHOP.  Patient reports that she is tolerating treatment well so far, except occasional nausea, for which she takes Zofran with the relief of symptom.  She denies any vomiting, abdominal pain, or diarrhea.  She otherwise feels well today, and denies any complaint.  SUMMARY OF ONCOLOGIC HISTORY: Oncology History  Marginal  zone lymphoma ([place])  05/21/2018 Initial Diagnosis   Marginal zone  lymphoma ([place])   07/21/2018 -  Chemotherapy   The patient had DOXOrubicin (ADRIAMYCIN) chemo injection 42 mg, 25 mg/m2 = 42 mg (100 % of original dose 25 mg/m2), Intravenous,  Once, 4 of 6 cycles Dose modification: 25 mg/m2 (original dose 25 mg/m2, Cycle 1, Reason: Patient Age) Administration: 42 mg (07/21/2018), 42 mg (08/11/2018), 42 mg (09/01/2018) palonosetron (ALOXI) injection 0.25 mg, 0.25 mg, Intravenous,  Once, 4 of 6 cycles Administration: 0.25 mg (07/21/2018), 0.25 mg (08/11/2018), 0.25 mg (09/01/2018) pegfilgrastim (NEULASTA ONPRO KIT) injection 6 mg, 6 mg, Subcutaneous, Once, 4 of 6 cycles Administration: 6 mg (07/21/2018), 6 mg (08/11/2018), 6 mg (09/01/2018) vinCRIStine (ONCOVIN) 1 mg in sodium chloride 0.9 % 50 mL chemo infusion, 1 mg (100 % of original dose 1 mg), Intravenous,  Once, 4 of 6 cycles Dose modification: 1 mg (original dose 1 mg, Cycle 1, Reason: Patient Age) Administration: 1 mg (07/21/2018), 1 mg (08/11/2018), 1 mg (09/01/2018) riTUXimab (RITUXAN) 600 mg in sodium chloride 0.9 % 250 mL (1.9355 mg/mL) infusion, 375 mg/m2 = 600 mg, Intravenous,  Once, 1 of 1 cycle Administration: 600 mg (07/21/2018) cyclophosphamide (CYTOXAN) 660 mg in sodium chloride 0.9 % 250 mL chemo infusion, 400 mg/m2 = 660 mg (100 % of original dose 400 mg/m2), Intravenous,  Once, 4 of 6 cycles Dose modification: 400 mg/m2 (original dose 400 mg/m2, Cycle 1, Reason: Patient Age) Administration: 660 mg (07/21/2018), 660 mg (08/11/2018), 660 mg (09/01/2018) riTUXimab (RITUXAN) 600 mg in sodium chloride 0.9 % 190 mL infusion, 375 mg/m2 = 600 mg (100 % of original dose 375 mg/m2), Intravenous,  Once, 3 of 5 cycles Dose modification: 375 mg/m2 (original dose 375 mg/m2, Cycle 2) Administration: 600 mg (08/11/2018), 600 mg (09/01/2018)  for chemotherapy treatment.      REVIEW OF SYSTEMS:   Constitutional: ( - ) fevers, ( - )  chills , ( - ) night sweats Eyes: ( - ) blurriness of vision, ( - ) double vision, ( - ) watery  eyes Ears, nose, mouth, throat, and face: ( - ) mucositis, ( - ) sore throat Respiratory: ( - ) cough, ( - ) dyspnea, ( - ) wheezes Cardiovascular: ( - ) palpitation, ( - ) chest discomfort, ( - ) lower extremity swelling Gastrointestinal:  ( + ) nausea, ( - ) heartburn, ( - ) change in bowel habits Skin: ( - ) abnormal skin rashes Lymphatics: ( - ) new lymphadenopathy, ( - ) easy bruising Neurological: ( - ) numbness, ( - ) tingling, ( - ) new weaknesses Behavioral/Psych: ( - ) mood change, ( - ) new changes  All other systems were reviewed with the patient and are negative.  I have reviewed the past medical history, past surgical history, social history and family history with the patient and they are unchanged from previous note.  ALLERGIES:  is allergic to phenergan [promethazine hcl]; levofloxacin; and pravastatin sodium.  MEDICATIONS:  Current Outpatient Medications  Medication Sig Dispense Refill  . acetaminophen (TYLENOL) 500 MG tablet Take 1,000 mg by mouth every 6 (six) hours as needed for mild pain.    [name] albuterol (PROVENTIL) (2.5 MG/3ML) 0.083% nebulizer solution Take 3 mLs (2.5 mg total) by nebulization every 6 (six) hours as needed for wheezing or shortness of breath. 75 mL 12  . amLODipine (NORVASC) 5 MG tablet Take 1 tablet (5 mg total) by mouth daily with breakfast. 90 tablet 1  . buPROPion (  WELLBUTRIN XL) 150 MG 24 hr tablet TAKE 1 TABLET BY MOUTH EVERY DAY 90 tablet 1  . famotidine (PEPCID) 20 MG tablet One at bedtime (Patient taking differently: Take 20 mg by mouth at bedtime. One at bedtime ) 30 tablet 11  . feeding supplement, ENSURE ENLIVE, (ENSURE ENLIVE) LIQD Take 237 mLs by mouth 2 (two) times daily between meals. 60 Bottle 0  . levothyroxine (SYNTHROID) 125 MCG tablet TAKE 1 TABLET BY MOUTH DAILY 90 tablet 0  . lidocaine (XYLOCAINE) 2 % solution     . lidocaine-prilocaine (EMLA) cream Apply to affected area once 30 g 3  . magic mouthwash w/lidocaine SOLN Take 5  mLs by mouth 4 (four) times daily as needed for mouth pain. Swish and Spit (Patient not taking: Reported on 09/01/2018) 240 mL 0  . omeprazole (PRILOSEC) 20 MG capsule Take 2 x 30 min before breakfast (Patient taking differently: Take 20 mg by mouth daily. )    . ondansetron (ZOFRAN) 8 MG tablet Take 1 tablet (8 mg total) by mouth 2 (two) times daily as needed for refractory nausea / vomiting. Start on day 3 after cyclophosphamide chemotherapy. 30 tablet 1  . predniSONE (DELTASONE) 20 MG tablet TK 3 AND 1/2 TS PO D FOR 5 DAYS. TK ON DAYS 1 THROUGH 5 OF CHEMOTHERAPY    . prochlorperazine (COMPAZINE) 10 MG tablet Take 1 tablet (10 mg total) by mouth every 6 (six) hours as needed (Nausea or vomiting). 30 tablet 6  . traZODone (DESYREL) 50 MG tablet Take 1 tablet (50 mg total) by mouth at bedtime as needed for sleep. 30 tablet 1   No current facility-administered medications for this visit.    Facility-Administered Medications Ordered in Other Visits  Medication Dose Route Frequency Provider Last Rate Last Dose  . cyclophosphamide (CYTOXAN) 660 mg in sodium chloride 0.9 % 250 mL chemo infusion  400 mg/m2 (Treatment Plan Recorded) Intravenous Once Tish Men, MD      . DOXOrubicin (ADRIAMYCIN) chemo injection 42 mg  25 mg/m2 (Treatment Plan Recorded) Intravenous Once Tish Men, MD      . heparin lock flush 100 unit/mL  500 Units Intracatheter Once PRN Tish Men, MD      . pegfilgrastim (NEULASTA ONPRO KIT) injection 6 mg  6 mg Subcutaneous Once Tish Men, MD      . riTUXimab (RITUXAN) 600 mg in sodium chloride 0.9 % 190 mL infusion  375 mg/m2 (Treatment Plan Recorded) Intravenous Once Tish Men, MD      . sodium chloride flush (NS) 0.9 % injection 10 mL  10 mL Intracatheter PRN Tish Men, MD      . vinCRIStine (ONCOVIN) 1 mg in sodium chloride 0.9 % 50 mL chemo infusion  1 mg Intravenous Once Tish Men, MD        PHYSICAL EXAMINATION: ECOG PERFORMANCE STATUS: 2 - Symptomatic, <50% confined to  bed  Today's Vitals   09/22/18 0927  BP: 134/62  Pulse: 68  Resp: 19  Temp: 97.6 F (36.4 C)  TempSrc: Oral  SpO2: 100%  PainSc: 0-No pain   There is no height or weight on file to calculate BMI.  There were no vitals filed for this visit.  GENERAL: alert, no distress and comfortable SKIN: skin color, texture, turgor are normal, no rashes or significant lesions EYES: conjunctiva are pink and non-injected, sclera clear OROPHARYNX: no exudate, no erythema; lips, buccal mucosa, and tongue normal  NECK: supple, non-tender LYMPH:  no palpable lymphadenopathy in the cervical  LUNGS: clear to auscultation with normal breathing effort HEART: regular rate & rhythm and no murmurs and no lower extremity edema ABDOMEN: soft, non-tender, non-distended, normal bowel sounds Musculoskeletal: no cyanosis of digits and no clubbing  PSYCH: alert & oriented x 3, fluent speech NEURO: no focal motor/sensory deficits  LABORATORY DATA:  I have reviewed the data as listed    Component Value Date/Time   NA 143 09/22/2018 0855   NA 129 (A) 06/23/2018   K 4.0 09/22/2018 0855   CL 107 09/22/2018 0855   CO2 27 09/22/2018 0855   GLUCOSE 115 (H) 09/22/2018 0855   BUN 16 09/22/2018 0855   BUN 14 06/23/2018   CREATININE 0.96 09/22/2018 0855   CREATININE 1.36 (H) 10/27/2015 1530   CALCIUM 8.3 (L) 09/22/2018 0855   CALCIUM 12.3 (H) 06/16/2018 1639   PROT 5.4 (L) 09/22/2018 0855   ALBUMIN 3.9 09/22/2018 0855   AST 13 (L) 09/22/2018 0855   ALT 10 09/22/2018 0855   ALKPHOS 72 09/22/2018 0855   BILITOT 0.3 09/22/2018 0855   GFRNONAA 56 (L) 09/22/2018 0855   GFRAA >60 09/22/2018 0855    No results found for: SPEP, UPEP  Lab Results  Component Value Date   WBC 2.7 (L) 09/22/2018   NEUTROABS 1.3 (L) 09/22/2018   HGB 10.4 (L) 09/22/2018   HCT 32.2 (L) 09/22/2018   MCV 87.5 09/22/2018   PLT 265 09/22/2018      Chemistry      Component Value Date/Time   NA 143 09/22/2018 0855   NA 129 (A)  06/23/2018   K 4.0 09/22/2018 0855   CL 107 09/22/2018 0855   CO2 27 09/22/2018 0855   BUN 16 09/22/2018 0855   BUN 14 06/23/2018   CREATININE 0.96 09/22/2018 0855   CREATININE 1.36 (H) 10/27/2015 1530   GLU 91 06/23/2018      Component Value Date/Time   CALCIUM 8.3 (L) 09/22/2018 0855   CALCIUM 12.3 (H) 06/16/2018 1639   ALKPHOS 72 09/22/2018 0855   AST 13 (L) 09/22/2018 0855   ALT 10 09/22/2018 0855   BILITOT 0.3 09/22/2018 0855       RADIOGRAPHIC STUDIES: I have personally reviewed the radiological images as listed below and agreed with the findings in the report. Nm Pet Image Restag (ps) Skull Base To Thigh  Result Date: 09/16/2018 CLINICAL DATA:  Subsequent treatment strategy for lymphoma. EXAM: NUCLEAR MEDICINE PET SKULL BASE TO THIGH TECHNIQUE: 6.9 mCi F-18 FDG was injected intravenously. Full-ring PET imaging was performed from the skull base to thigh after the radiotracer. CT data was obtained and used for attenuation correction and anatomic localization. Fasting blood glucose: 95 mg/dl COMPARISON:  05/29/2018 FINDINGS: Mediastinal blood pool activity: SUV max 2.14 Liver activity: SUV max 3.31 NECK: No hypermetabolic lymph nodes in the neck. Incidental CT findings: none CHEST: Interval resolution of previous bilateral supraclavicular and axillary adenopathy. Index lymph node within the left axilla measures 0.8 cm and has an SUV max of 1.37. Previously 1.4 cm with SUV max of 10.29 Interval resolution of mediastinal and hilar adenopathy. Index left pre-vascular lymph node measures 0.8 cm with SUV max of 2.47. This is compared with 1.6 cm previously with SUV max of 11.53. Incidental CT findings: Right apex lung scarring. Small nodule in the anterolateral left upper lobe measures 4 mm and is too small to characterize. Nonspecific, but new from previous exam. No FDG avid pulmonary nodules identified. Aortic atherosclerosis. ABDOMEN/PELVIS: No abnormal hypermetabolic activity within the  liver,  pancreas, adrenal glands, or spleen. No hypermetabolic lymph nodes in the abdomen or pelvis. Previous index aortocaval node measures 6 mm on today's exam with SUV max of 1.96. Index left retroperitoneal node measures 0.4 cm. With SUV max of 1.31. Previously 0.9 cm with SUV max of 8.49. Index right external iliac node measures 0.5 cm within SUV max of 1.3. Previously 1.5 cm within SUV max of 12.3. Index left inguinal lymph node measures 0.4 cm within SUV max of 0.6. Previously 6 mm with SUV max of 5.12. The spleen measures 10 cm in length and has an SUV max of 2.5. On the previous exam the spleen measured 12.3 cm with SUV max of 6.0. Incidental CT findings: Aortic atherosclerosis noted.  No aneurysm. SKELETON: Previous asymmetric uptake within the left acetabulum has an SUV max of 2.72 on the current exam. Previously 3.95. Previous asymmetric uptake within the tip of the left scapula has an SUV max of 1.5 on today's study. Previously 3.3. Incidental CT findings: none IMPRESSION: 1. Interval resolution of hypermetabolic adenopathy within the neck, chest, abdomen and pelvis. Mild residual areas of increased uptake within subcentimeter lymph nodes are compatible with Deauville criteria 2 and 3 lesions. No findings to suggest residual metabolically active disease at this time. No new sites of disease identified 2. Small nonspecific nodule in the left upper lobe measuring 4 mm. New, nonspecific. Attention on follow-up imaging advised. 3.  Aortic Atherosclerosis (ICD10-I70.0). Electronically Signed   By: Kerby Moors M.D.   On: 09/16/2018 12:23

## 2018-09-22 NOTE — Progress Notes (Signed)
Ok to treat with ANC 1.3 per Dr. Maylon Peppers

## 2018-09-22 NOTE — Patient Instructions (Signed)
Dexamethasone injection What is this medicine? DEXAMETHASONE (dex a METH a sone) is a corticosteroid. It is used to treat inflammation of the skin, joints, lungs, and other organs. Common conditions treated include asthma, allergies, and arthritis. It is also used for other conditions, like blood disorders and diseases of the adrenal glands. This medicine may be used for other purposes; ask your health care provider or pharmacist if you have questions. COMMON BRAND NAME(S): Decadron, DoubleDex, Simplist Dexamethasone, Solurex What should I tell my health care provider before I take this medicine? They need to know if you have any of these conditions:  blood clotting problems  Cushing's syndrome  diabetes  glaucoma  heart problems or disease  high blood pressure  infection like herpes, measles, tuberculosis, or chickenpox  kidney disease  liver disease  mental problems  myasthenia gravis  osteoporosis  previous heart attack  seizures  stomach, ulcer or intestine disease including colitis and diverticulitis  thyroid problem  an unusual or allergic reaction to dexamethasone, corticosteroids, other medicines, lactose, foods, dyes, or preservatives  pregnant or trying to get pregnant  breast-feeding How should I use this medicine? This medicine is for injection into a muscle, joint, lesion, soft tissue, or vein. It is given by a health care professional in a hospital or clinic setting. Talk to your pediatrician regarding the use of this medicine in children. Special care may be needed. Overdosage: If you think you have taken too much of this medicine contact a poison control center or emergency room at once. NOTE: This medicine is only for you. Do not share this medicine with others. What if I miss a dose? This may not apply. If you are having a series of injections over a prolonged period, try not to miss an appointment. Call your doctor or health care professional to  reschedule if you are unable to keep an appointment. What may interact with this medicine? Do not take this medicine with any of the following medications:  mifepristone, RU-486  vaccines This medicine may also interact with the following medications:  amphotericin B  antibiotics like clarithromycin, erythromycin, and troleandomycin  aspirin and aspirin-like drugs  barbiturates like phenobarbital  carbamazepine  cholestyramine  cholinesterase inhibitors like donepezil, galantamine, rivastigmine, and tacrine  cyclosporine  digoxin  diuretics  ephedrine  female hormones, like estrogens or progestins and birth control pills  indinavir  isoniazid  ketoconazole  medicines for diabetes  medicines that improve muscle tone or strength for conditions like myasthenia gravis  NSAIDs, medicines for pain and inflammation, like ibuprofen or naproxen  phenytoin  rifampin  thalidomide  warfarin This list may not describe all possible interactions. Give your health care provider a list of all the medicines, herbs, non-prescription drugs, or dietary supplements you use. Also tell them if you smoke, drink alcohol, or use illegal drugs. Some items may interact with your medicine. What should I watch for while using this medicine? Your condition will be monitored carefully while you are receiving this medicine. If you are taking this medicine for a long time, carry an identification card with your name and address, the type and dose of your medicine, and your doctor's name and address. This medicine may increase your risk of getting an infection. Stay away from people who are sick. Tell your doctor or health care professional if you are around anyone with measles or chickenpox. Talk to your health care provider before you get any vaccines that you take this medicine. If you are going to  have surgery, tell your doctor or health care professional that you have taken this medicine  within the last twelve months. Ask your doctor or health care professional about your diet. You may need to lower the amount of salt you eat. This medicine may increase blood sugar. Ask your healthcare provider if changes in diet or medicines are needed if you have diabetes. What side effects may I notice from receiving this medicine? Side effects that you should report to your doctor or health care professional as soon as possible:  allergic reactions like skin rash, itching or hives, swelling of the face, lips, or tongue  black or tarry stools  change in the amount of urine  confusion, excitement, restlessness, a false sense of well-being  fever, sore throat, sneezing, cough, or other signs of infection, wounds that will not heal  hallucinations  mental depression, mood swings, mistaken feelings of self importance or of being mistreated  pain in hips, back, ribs, arms, shoulders, or legs  pain, redness, or irritation at the injection site  redness, blistering, peeling or loosening of the skin, including inside the mouth  rounding out of face   signs and symptoms of high blood sugar such as being more thirsty or hungry or having to urinate more than normal. You may also feel very tired or have blurry vision.  swelling of feet or lower legs  unusual bleeding or bruising  wounds that do not heal Side effects that usually do not require medical attention (report to your doctor or health care professional if they continue or are bothersome):  diarrhea or constipation  change in taste  headache  nausea, vomiting  skin problems, acne, thin and shiny skin  trouble sleeping  unusual growth of hair on the face or body  weight gain This list may not describe all possible side effects. Call your doctor for medical advice about side effects. You may report side effects to FDA at 1-800-FDA-1088. Where should I keep my medicine? This drug is given in a hospital or clinic and  will not be stored at home. NOTE: This sheet is a summary. It may not cover all possible information. If you have questions about this medicine, talk to your doctor, pharmacist, or health care provider.  2020 Elsevier/Gold Standard (2017-11-19 10:35:53) Palonosetron Injection What is this medicine? PALONOSETRON (pal oh NOE se tron) is used to prevent nausea and vomiting caused by chemotherapy. It also helps prevent delayed nausea and vomiting that may occur a few days after your treatment. This medicine may be used for other purposes; ask your health care provider or pharmacist if you have questions. COMMON BRAND NAME(S): Aloxi What should I tell my health care provider before I take this medicine? They need to know if you have any of these conditions:  an unusual or allergic reaction to palonosetron, dolasetron, granisetron, ondansetron, other medicines, foods, dyes, or preservatives  pregnant or trying to get pregnant  breast-feeding How should I use this medicine? This medicine is for infusion into a vein. It is given by a health care professional in a hospital or clinic setting. Talk to your pediatrician regarding the use of this medicine in children. While this drug may be prescribed for children as young as 1 month for selected conditions, precautions do apply. Overdosage: If you think you have taken too much of this medicine contact a poison control center or emergency room at once. NOTE: This medicine is only for you. Do not share this medicine with others.  What if I miss a dose? This does not apply. What may interact with this medicine?  certain medicines for depression, anxiety, or psychotic disturbances  fentanyl  linezolid  MAOIs like Carbex, Eldepryl, Marplan, Nardil, and Parnate  methylene blue (injected into a vein)  tramadol This list may not describe all possible interactions. Give your health care provider a list of all the medicines, herbs, non-prescription  drugs, or dietary supplements you use. Also tell them if you smoke, drink alcohol, or use illegal drugs. Some items may interact with your medicine. What should I watch for while using this medicine? Your condition will be monitored carefully while you are receiving this medicine. What side effects may I notice from receiving this medicine? Side effects that you should report to your doctor or health care professional as soon as possible:  allergic reactions like skin rash, itching or hives, swelling of the face, lips, or tongue  breathing problems  confusion  dizziness  fast, irregular heartbeat  fever and chills  loss of balance or coordination  seizures  sweating  swelling of the hands and feet  tremors  unusually weak or tired Side effects that usually do not require medical attention (report to your doctor or health care professional if they continue or are bothersome):  constipation or diarrhea  headache This list may not describe all possible side effects. Call your doctor for medical advice about side effects. You may report side effects to FDA at 1-800-FDA-1088. Where should I keep my medicine? This drug is given in a hospital or clinic and will not be stored at home. NOTE: This sheet is a summary. It may not cover all possible information. If you have questions about this medicine, talk to your doctor, pharmacist, or health care provider.  2020 Elsevier/Gold Standard (2012-12-25 10:38:36) Acetaminophen tablets or caplets What is this medicine? ACETAMINOPHEN (a set a MEE noe fen) is a pain reliever. It is used to treat mild pain and fever. This medicine may be used for other purposes; ask your health care provider or pharmacist if you have questions. COMMON BRAND NAME(S): Aceta, Actamin, Anacin Aspirin Free, Genapap, Genebs, Mapap, Pain & Fever, Pain and Fever, PAIN RELIEF, PAIN RELIEF Extra Strength, Pain Reliever, Panadol, PHARBETOL, Q-Pap, Q-Pap Extra Strength,  Tylenol, Tylenol CrushableTablet, Tylenol Extra Strength, XS No Aspirin, XS Pain Reliever What should I tell my health care provider before I take this medicine? They need to know if you have any of these conditions:  if you often drink alcohol  liver disease  an unusual or allergic reaction to acetaminophen, other medicines, foods, dyes, or preservatives  pregnant or trying to get pregnant  breast-feeding How should I use this medicine? Take this medicine by mouth with a glass of water. Follow the directions on the package or prescription label. Take your medicine at regular intervals. Do not take your medicine more often than directed. Talk to your pediatrician regarding the use of this medicine in children. While this drug may be prescribed for children as young as 39 years of age for selected conditions, precautions do apply. Overdosage: If you think you have taken too much of this medicine contact a poison control center or emergency room at once. NOTE: This medicine is only for you. Do not share this medicine with others. What if I miss a dose? If you miss a dose, take it as soon as you can. If it is almost time for your next dose, take only that dose. Do not take  double or extra doses. What may interact with this medicine?  alcohol  imatinib  isoniazid  other medicines with acetaminophen This list may not describe all possible interactions. Give your health care provider a list of all the medicines, herbs, non-prescription drugs, or dietary supplements you use. Also tell them if you smoke, drink alcohol, or use illegal drugs. Some items may interact with your medicine. What should I watch for while using this medicine? Tell your doctor or health care professional if the pain lasts more than 10 days (5 days for children), if it gets worse, or if there is a new or different kind of pain. Also, check with your doctor if a fever lasts for more than 3 days. Do not take other  medicines that contain acetaminophen with this medicine. Always read labels carefully. If you have questions, ask your doctor or pharmacist. If you take too much acetaminophen get medical help right away. Too much acetaminophen can be very dangerous and cause liver damage. Even if you do not have symptoms, it is important to get help right away. What side effects may I notice from receiving this medicine? Side effects that you should report to your doctor or health care professional as soon as possible:  allergic reactions like skin rash, itching or hives, swelling of the face, lips, or tongue  breathing problems  fever or sore throat  redness, blistering, peeling or loosening of the skin, including inside the mouth  trouble passing urine or change in the amount of urine  unusual bleeding or bruising  unusually weak or tired  yellowing of the eyes or skin Side effects that usually do not require medical attention (report to your doctor or health care professional if they continue or are bothersome):  headache  nausea, stomach upset This list may not describe all possible side effects. Call your doctor for medical advice about side effects. You may report side effects to FDA at 1-800-FDA-1088. Where should I keep my medicine? Keep out of reach of children. Store at room temperature between 20 and 25 degrees C (68 and 77 degrees F). Protect from moisture and heat. Throw away any unused medicine after the expiration date. NOTE: This sheet is a summary. It may not cover all possible information. If you have questions about this medicine, talk to your doctor, pharmacist, or health care provider.  2020 Elsevier/Gold Standard (2012-10-12 12:54:16) Diphenhydramine capsules or tablets What is this medicine? DIPHENHYDRAMINE (dye fen HYE dra meen) is an antihistamine. It is used to treat the symptoms of an allergic reaction. It is also used to treat Parkinson's disease. This medicine is also  used to prevent and to treat motion sickness and as a nighttime sleep aid. This medicine may be used for other purposes; ask your health care provider or pharmacist if you have questions. COMMON BRAND NAME(S): Alka-Seltzer Plus Allergy, Aller-G-Time, Banophen, Benadryl Allergy, Benadryl Allergy Dye Free, Benadryl Allergy Kapgel, Benadryl Allergy Ultratab, Diphedryl, Diphenhist, Genahist, Geri-Dryl, PHARBEDRYL, Q-Dryl, Gretta Began, Valu-Dryl, Vicks ZzzQuil Nightime Sleep-Aid What should I tell my health care provider before I take this medicine? They need to know if you have any of these conditions:  asthma or lung disease  glaucoma  high blood pressure or heart disease  liver disease  pain or difficulty passing urine  prostate trouble  ulcers or other stomach problems  an unusual or allergic reaction to diphenhydramine, other medicines foods, dyes, or preservatives such as sulfites  pregnant or trying to get pregnant  breast-feeding How should  I use this medicine? Take this medicine by mouth with a full glass of water. Follow the directions on the prescription label. Take your doses at regular intervals. Do not take your medicine more often than directed. To prevent motion sickness start taking this medicine 30 to 60 minutes before you leave. Talk to your pediatrician regarding the use of this medicine in children. Special care may be needed. Patients over 52 years old may have a stronger reaction and need a smaller dose. Overdosage: If you think you have taken too much of this medicine contact a poison control center or emergency room at once. NOTE: This medicine is only for you. Do not share this medicine with others. What if I miss a dose? If you miss a dose, take it as soon as you can. If it is almost time for your next dose, take only that dose. Do not take double or extra doses. What may interact with this medicine? Do not take this medicine with any of the following  medications:  MAOIs like Carbex, Eldepryl, Marplan, Nardil, and Parnate This medicine may also interact with the following medications:  alcohol  barbiturates, like phenobarbital  medicines for bladder spasm like oxybutynin, tolterodine  medicines for blood pressure  medicines for depression, anxiety, or psychotic disturbances  medicines for movement abnormalities or Parkinson's disease  medicines for sleep  other medicines for cold, cough or allergy  some medicines for the stomach like chlordiazepoxide, dicyclomine This list may not describe all possible interactions. Give your health care provider a list of all the medicines, herbs, non-prescription drugs, or dietary supplements you use. Also tell them if you smoke, drink alcohol, or use illegal drugs. Some items may interact with your medicine. What should I watch for while using this medicine? Visit your doctor or health care professional for regular check ups. Tell your doctor if your symptoms do not improve or if they get worse. Your mouth may get dry. Chewing sugarless gum or sucking hard candy, and drinking plenty of water may help. Contact your doctor if the problem does not go away or is severe. This medicine may cause dry eyes and blurred vision. If you wear contact lenses you may feel some discomfort. Lubricating drops may help. See your eye doctor if the problem does not go away or is severe. You may get drowsy or dizzy. Do not drive, use machinery, or do anything that needs mental alertness until you know how this medicine affects you. Do not stand or sit up quickly, especially if you are an older patient. This reduces the risk of dizzy or fainting spells. Alcohol may interfere with the effect of this medicine. Avoid alcoholic drinks. What side effects may I notice from receiving this medicine? Side effects that you should report to your doctor or health care professional as soon as possible:  allergic reactions like skin  rash, itching or hives, swelling of the face, lips, or tongue  changes in vision  confused, agitated, nervous  irregular or fast heartbeat  tremor  trouble passing urine  unusual bleeding or bruising  unusually weak or tired Side effects that usually do not require medical attention (report to your doctor or health care professional if they continue or are bothersome):  constipation, diarrhea  drowsy  headache  loss of appetite  stomach upset, vomiting  thick mucous This list may not describe all possible side effects. Call your doctor for medical advice about side effects. You may report side effects to FDA at  1-800-FDA-1088. Where should I keep my medicine? Keep out of the reach of children. Store at room temperature between 15 and 30 degrees C (59 and 86 degrees F). Keep container closed tightly. Throw away any unused medicine after the expiration date. NOTE: This sheet is a summary. It may not cover all possible information. If you have questions about this medicine, talk to your doctor, pharmacist, or health care provider.  2020 Elsevier/Gold Standard (2007-06-08 17:06:22) Rituximab; Hyaluronidase injection What is this medicine? RITUXIMAB; HYALURONIDASE (ri TUX i mab / hye al ur ON i dase) is used to treat non-Hodgkin lymphoma and chronic lymphocytic leukemia. Rituximab is a monoclonal antibody. Hyaluronidase is used to improve the effects of rituximab. This medicine may be used for other purposes; ask your health care provider or pharmacist if you have questions. COMMON BRAND NAME(S): Rituxan Hycela What should I tell my health care provider before I take this medicine? They need to know if you have any of these conditions:  heart disease  infection (especially a virus infection such as hepatitis B, chickenpox, cold sores, or herpes)  immune system problems  irregular heartbeat  kidney disease  lung or breathing disease, like asthma  recently received or  scheduled to receive a vaccine  an unusual or allergic reaction to rituximab, rituximab/hyaluronidase, mouse proteins, other medicines, foods, dyes, or preservatives  pregnant or trying to get pregnant  breast-feeding How should I use this medicine? This medicine is for injection under the skin. It is given by a health care professional in a hospital or clinic setting. A special MedGuide will be given to you before each treatment. Be sure to read this information carefully each time. Talk to your pediatrician regarding the use of this medicine in children. Special care may be needed. Overdosage: If you think you have taken too much of this medicine contact a poison control center or emergency room at once. NOTE: This medicine is only for you. Do not share this medicine with others. What if I miss a dose? It is important not to miss your dose. Call your doctor or health care professional if you are unable to keep an appointment. What may interact with this medicine? This medicine may interact with the following medications:  cisplatin  live virus vaccines This list may not describe all possible interactions. Give your health care provider a list of all the medicines, herbs, non-prescription drugs, or dietary supplements you use. Also tell them if you smoke, drink alcohol, or use illegal drugs. Some items may interact with your medicine. What should I watch for while using this medicine? Your condition will be monitored carefully while you are receiving this medicine. You may need blood work done while you are taking this medicine. This medicine can cause serious allergic reactions. To reduce your risk you may need to take medicine before treatment with this medicine. Take your medicine as directed. In some patients, this medicine may cause a serious brain infection that may cause death. If you have any problems seeing, thinking, speaking, walking, or standing, tell your doctor right away. If  you cannot reach your doctor, urgently seek other source of medical care. Call your doctor or health care professional for advice if you get a fever, chills or sore throat, or other symptoms of a cold or flu. Do not treat yourself. This drug decreases your body's ability to fight infections. Try to avoid being around people who are sick. Do not become pregnant while taking this medicine or for 12  months after stopping it. Women should inform their doctor if they wish to become pregnant or think they might be pregnant. There is a potential for serious side effects to an unborn child. Talk to your health care professional or pharmacist for more information. Do not breast-feed an infant while taking this medicine or for at least 6 months after stopping it. What side effects may I notice from receiving this medicine? Side effects that you should report to your doctor or health care professional as soon as possible:  allergic reactions like skin rash, itching or hives; swelling of the face, lips, or tongue  breathing problems  chest pain  changes in vision  diarrhea  dizziness  headache with fever, neck stiffness, sensitivity to light, nausea, or confusion  fast, irregular heartbeat  loss of memory  low blood counts - this medicine may decrease the number of white blood cells, red blood cells and platelets. You may be at increased risk for infections and bleeding.  mouth sores  problems with balance, talking, or walking  redness, blistering, peeling or loosening of the skin, including inside the mouth  signs of infection - fever or chills, cough, sore throat, pain or difficulty passing urine  signs and symptoms of kidney injury like trouble passing urine or change in the amount of urine  signs and symptoms of liver injury like dark yellow or brown urine; general ill feeling or flu-like symptoms; light-colored stools; loss of appetite; nausea; right upper belly pain; unusually weak or  tired; yellowing of the eyes or skin  stomach pain  swelling of the ankles, feet, hands  unusual bleeding or bruising  vomiting Side effects that usually do not require medical attention (report these to your doctor or health care professional if they continue or are bothersome):  headache  joint pain  muscle cramps or muscle pain  nausea  pain, redness, or irritation at site where injected  tiredness This list may not describe all possible side effects. Call your doctor for medical advice about side effects. You may report side effects to FDA at 1-800-FDA-1088. Where should I keep my medicine? This drug is given in a hospital or clinic and will not be stored at home. NOTE: This sheet is a summary. It may not cover all possible information. If you have questions about this medicine, talk to your doctor, pharmacist, or health care provider.  2020 Elsevier/Gold Standard (2017-01-31 13:12:25) Cyclophosphamide injection What is this medicine? CYCLOPHOSPHAMIDE (sye kloe FOSS fa mide) is a chemotherapy drug. It slows the growth of cancer cells. This medicine is used to treat many types of cancer like lymphoma, myeloma, leukemia, breast cancer, and ovarian cancer, to name a few. This medicine may be used for other purposes; ask your health care provider or pharmacist if you have questions. COMMON BRAND NAME(S): Cytoxan, Neosar What should I tell my health care provider before I take this medicine? They need to know if you have any of these conditions:  blood disorders  history of other chemotherapy  infection  kidney disease  liver disease  recent or ongoing radiation therapy  tumors in the bone marrow  an unusual or allergic reaction to cyclophosphamide, other chemotherapy, other medicines, foods, dyes, or preservatives  pregnant or trying to get pregnant  breast-feeding How should I use this medicine? This drug is usually given as an injection into a vein or muscle  or by infusion into a vein. It is administered in a hospital or clinic by a specially trained  health care professional. Talk to your pediatrician regarding the use of this medicine in children. Special care may be needed. Overdosage: If you think you have taken too much of this medicine contact a poison control center or emergency room at once. NOTE: This medicine is only for you. Do not share this medicine with others. What if I miss a dose? It is important not to miss your dose. Call your doctor or health care professional if you are unable to keep an appointment. What may interact with this medicine? This medicine may interact with the following medications:  amiodarone  amphotericin B  azathioprine  certain antiviral medicines for HIV or AIDS such as protease inhibitors (e.g., indinavir, ritonavir) and zidovudine  certain blood pressure medications such as benazepril, captopril, enalapril, fosinopril, lisinopril, moexipril, monopril, perindopril, quinapril, ramipril, trandolapril  certain cancer medications such as anthracyclines (e.g., daunorubicin, doxorubicin), busulfan, cytarabine, paclitaxel, pentostatin, tamoxifen, trastuzumab  certain diuretics such as chlorothiazide, chlorthalidone, hydrochlorothiazide, indapamide, metolazone  certain medicines that treat or prevent blood clots like warfarin  certain muscle relaxants such as succinylcholine  cyclosporine  etanercept  indomethacin  medicines to increase blood counts like filgrastim, pegfilgrastim, sargramostim  medicines used as general anesthesia  metronidazole  natalizumab This list may not describe all possible interactions. Give your health care provider a list of all the medicines, herbs, non-prescription drugs, or dietary supplements you use. Also tell them if you smoke, drink alcohol, or use illegal drugs. Some items may interact with your medicine. What should I watch for while using this medicine? Visit  your doctor for checks on your progress. This drug may make you feel generally unwell. This is not uncommon, as chemotherapy can affect healthy cells as well as cancer cells. Report any side effects. Continue your course of treatment even though you feel ill unless your doctor tells you to stop. Drink water or other fluids as directed. Urinate often, even at night. In some cases, you may be given additional medicines to help with side effects. Follow all directions for their use. Call your doctor or health care professional for advice if you get a fever, chills or sore throat, or other symptoms of a cold or flu. Do not treat yourself. This drug decreases your body's ability to fight infections. Try to avoid being around people who are sick. This medicine may increase your risk to bruise or bleed. Call your doctor or health care professional if you notice any unusual bleeding. Be careful brushing and flossing your teeth or using a toothpick because you may get an infection or bleed more easily. If you have any dental work done, tell your dentist you are receiving this medicine. You may get drowsy or dizzy. Do not drive, use machinery, or do anything that needs mental alertness until you know how this medicine affects you. Do not become pregnant while taking this medicine or for 1 year after stopping it. Women should inform their doctor if they wish to become pregnant or think they might be pregnant. Men should not father a child while taking this medicine and for 4 months after stopping it. There is a potential for serious side effects to an unborn child. Talk to your health care professional or pharmacist for more information. Do not breast-feed an infant while taking this medicine. This medicine may interfere with the ability to have a child. This medicine has caused ovarian failure in some women. This medicine has caused reduced sperm counts in some men. You should talk with your  doctor or health care  professional if you are concerned about your fertility. If you are going to have surgery, tell your doctor or health care professional that you have taken this medicine. What side effects may I notice from receiving this medicine? Side effects that you should report to your doctor or health care professional as soon as possible:  allergic reactions like skin rash, itching or hives, swelling of the face, lips, or tongue  low blood counts - this medicine may decrease the number of white blood cells, red blood cells and platelets. You may be at increased risk for infections and bleeding.  signs of infection - fever or chills, cough, sore throat, pain or difficulty passing urine  signs of decreased platelets or bleeding - bruising, pinpoint red spots on the skin, black, tarry stools, blood in the urine  signs of decreased red blood cells - unusually weak or tired, fainting spells, lightheadedness  breathing problems  dark urine  dizziness  palpitations  swelling of the ankles, feet, hands  trouble passing urine or change in the amount of urine  weight gain  yellowing of the eyes or skin Side effects that usually do not require medical attention (report to your doctor or health care professional if they continue or are bothersome):  changes in nail or skin color  hair loss  missed menstrual periods  mouth sores  nausea, vomiting This list may not describe all possible side effects. Call your doctor for medical advice about side effects. You may report side effects to FDA at 1-800-FDA-1088. Where should I keep my medicine? This drug is given in a hospital or clinic and will not be stored at home. NOTE: This sheet is a summary. It may not cover all possible information. If you have questions about this medicine, talk to your doctor, pharmacist, or health care provider.  2020 [employer]/Gold Standard (2012-01-03 16:22:58) Vincristine injection What is this medicine? VINCRISTINE  (vin KRIS teen) is a chemotherapy drug. It slows the growth of cancer cells. This medicine is used to treat many types of cancer like Hodgkin's disease, leukemia, non-Hodgkin's lymphoma, neuroblastoma (brain cancer), rhabdomyosarcoma, and Wilms' tumor. This medicine may be used for other purposes; ask your health care provider or pharmacist if you have questions. COMMON BRAND NAME(S): Oncovin, Vincasar PFS What should I tell my health care provider before I take this medicine? They need to know if you have any of these conditions:  blood disorders  gout  infection (especially chickenpox, cold sores, or herpes)  kidney disease  liver disease  lung disease  nervous system disease like Charcot-Marie-Tooth (CMT)  recent or ongoing radiation therapy  an unusual or allergic reaction to vincristine, other chemotherapy agents, other medicines, foods, dyes, or preservatives  pregnant or trying to get pregnant  breast-feeding How should I use this medicine? This drug is given as an infusion into a vein. It is administered in a hospital or clinic by a specially trained health care professional. If you have pain, swelling, burning, or any unusual feeling around the site of your injection, tell your health care professional right away. Talk to your pediatrician regarding the use of this medicine in children. While this drug may be prescribed for selected conditions, precautions do apply. Overdosage: If you think you have taken too much of this medicine contact a poison control center or emergency room at once. NOTE: This medicine is only for you. Do not share this medicine with others. What if I miss a dose? It  is important not to miss your dose. Call your doctor or health care professional if you are unable to keep an appointment. What may interact with this medicine? Do not take this medicine with any of the following medications:  itraconazole  mibefradil  voriconazole This medicine  may also interact with the following medications:  cyclosporine  erythromycin  fluconazole  ketoconazole  medicines for HIV like delavirdine, efavirenz, nevirapine  medicines for seizures like ethotoin, fosphenotoin, phenytoin  medicines to increase blood counts like filgrastim, pegfilgrastim, sargramostim  other chemotherapy drugs like cisplatin, L-asparaginase, methotrexate, mitomycin, paclitaxel  pegaspargase  vaccines  zalcitabine, ddC Talk to your doctor or health care professional before taking any of these medicines:  acetaminophen  aspirin  ibuprofen  ketoprofen  naproxen This list may not describe all possible interactions. Give your health care provider a list of all the medicines, herbs, non-prescription drugs, or dietary supplements you use. Also tell them if you smoke, drink alcohol, or use illegal drugs. Some items may interact with your medicine. What should I watch for while using this medicine? Your condition will be monitored carefully while you are receiving this medicine. You will need important blood work done while you are taking this medicine. This drug may make you feel generally unwell. This is not uncommon, as chemotherapy can affect healthy cells as well as cancer cells. Report any side effects. Continue your course of treatment even though you feel ill unless your doctor tells you to stop. In some cases, you may be given additional medicines to help with side effects. Follow all directions for their use. Call your doctor or health care professional for advice if you get a fever, chills or sore throat, or other symptoms of a cold or flu. Do not treat yourself. Avoid taking products that contain aspirin, acetaminophen, ibuprofen, naproxen, or ketoprofen unless instructed by your doctor. These medicines may hide a fever. Do not become pregnant while taking this medicine. Women should inform their doctor if they wish to become pregnant or think they  might be pregnant. There is a potential for serious side effects to an unborn child. Talk to your health care professional or pharmacist for more information. Do not breast-feed an infant while taking this medicine. Men may have a lower sperm count while taking this medicine. Talk to your doctor if you plan to father a child. What side effects may I notice from receiving this medicine? Side effects that you should report to your doctor or health care professional as soon as possible:  allergic reactions like skin rash, itching or hives, swelling of the face, lips, or tongue  breathing problems  confusion or changes in emotions or moods  constipation  cough  mouth sores  muscle weakness  nausea and vomiting  pain, swelling, redness or irritation at the injection site  pain, tingling, numbness in the hands or feet  problems with balance, talking, walking  seizures  stomach pain  trouble passing urine or change in the amount of urine Side effects that usually do not require medical attention (report to your doctor or health care professional if they continue or are bothersome):  diarrhea  hair loss  jaw pain  loss of appetite This list may not describe all possible side effects. Call your doctor for medical advice about side effects. You may report side effects to FDA at 1-800-FDA-1088. Where should I keep my medicine? This drug is given in a hospital or clinic and will not be stored at home. NOTE:  This sheet is a summary. It may not cover all possible information. If you have questions about this medicine, talk to your doctor, pharmacist, or health care provider.  2020 [employer]/Gold Standard (2007-11-16 17:17:13) Doxorubicin injection What is this medicine? DOXORUBICIN (dox oh ROO bi sin) is a chemotherapy drug. It is used to treat many kinds of cancer like leukemia, lymphoma, neuroblastoma, sarcoma, and Wilms' tumor. It is also used to treat bladder cancer, breast  cancer, lung cancer, ovarian cancer, stomach cancer, and thyroid cancer. This medicine may be used for other purposes; ask your health care provider or pharmacist if you have questions. COMMON BRAND NAME(S): Adriamycin, Adriamycin PFS, Adriamycin RDF, Rubex What should I tell my health care provider before I take this medicine? They need to know if you have any of these conditions:  heart disease  history of low blood counts caused by a medicine  liver disease  recent or ongoing radiation therapy  an unusual or allergic reaction to doxorubicin, other chemotherapy agents, other medicines, foods, dyes, or preservatives  pregnant or trying to get pregnant  breast-feeding How should I use this medicine? This drug is given as an infusion into a vein. It is administered in a hospital or clinic by a specially trained health care professional. If you have pain, swelling, burning or any unusual feeling around the site of your injection, tell your health care professional right away. Talk to your pediatrician regarding the use of this medicine in children. Special care may be needed. Overdosage: If you think you have taken too much of this medicine contact a poison control center or emergency room at once. NOTE: This medicine is only for you. Do not share this medicine with others. What if I miss a dose? It is important not to miss your dose. Call your doctor or health care professional if you are unable to keep an appointment. What may interact with this medicine? This medicine may interact with the following medications:  6-mercaptopurine  paclitaxel  phenytoin  St. John's Wort  trastuzumab  verapamil This list may not describe all possible interactions. Give your health care provider a list of all the medicines, herbs, non-prescription drugs, or dietary supplements you use. Also tell them if you smoke, drink alcohol, or use illegal drugs. Some items may interact with your  medicine. What should I watch for while using this medicine? This drug may make you feel generally unwell. This is not uncommon, as chemotherapy can affect healthy cells as well as cancer cells. Report any side effects. Continue your course of treatment even though you feel ill unless your doctor tells you to stop. There is a maximum amount of this medicine you should receive throughout your life. The amount depends on the medical condition being treated and your overall health. Your doctor will watch how much of this medicine you receive in your lifetime. Tell your doctor if you have taken this medicine before. You may need blood work done while you are taking this medicine. Your urine may turn red for a few days after your dose. This is not blood. If your urine is dark or brown, call your doctor. In some cases, you may be given additional medicines to help with side effects. Follow all directions for their use. Call your doctor or health care professional for advice if you get a fever, chills or sore throat, or other symptoms of a cold or flu. Do not treat yourself. This drug decreases your body's ability to fight infections. Try  to avoid being around people who are sick. This medicine may increase your risk to bruise or bleed. Call your doctor or health care professional if you notice any unusual bleeding. Talk to your doctor about your risk of cancer. You may be more at risk for certain types of cancers if you take this medicine. Do not become pregnant while taking this medicine or for 6 months after stopping it. Women should inform their doctor if they wish to become pregnant or think they might be pregnant. Men should not father a child while taking this medicine and for 6 months after stopping it. There is a potential for serious side effects to an unborn child. Talk to your health care professional or pharmacist for more information. Do not breast-feed an infant while taking this medicine. This  medicine has caused ovarian failure in some women and reduced sperm counts in some men This medicine may interfere with the ability to have a child. Talk with your doctor or health care professional if you are concerned about your fertility. This medicine may cause a decrease in Co-Enzyme Q-10. You should make sure that you get enough Co-Enzyme Q-10 while you are taking this medicine. Discuss the foods you eat and the vitamins you take with your health care professional. What side effects may I notice from receiving this medicine? Side effects that you should report to your doctor or health care professional as soon as possible:  allergic reactions like skin rash, itching or hives, swelling of the face, lips, or tongue  breathing problems  chest pain  fast or irregular heartbeat  low blood counts - this medicine may decrease the number of white blood cells, red blood cells and platelets. You may be at increased risk for infections and bleeding.  pain, redness, or irritation at site where injected  signs of infection - fever or chills, cough, sore throat, pain or difficulty passing urine  signs of decreased platelets or bleeding - bruising, pinpoint red spots on the skin, black, tarry stools, blood in the urine  swelling of the ankles, feet, hands  tiredness  weakness Side effects that usually do not require medical attention (report to your doctor or health care professional if they continue or are bothersome):  diarrhea  hair loss  mouth sores  nail discoloration or damage  nausea  red colored urine  vomiting This list may not describe all possible side effects. Call your doctor for medical advice about side effects. You may report side effects to FDA at 1-800-FDA-1088. Where should I keep my medicine? This drug is given in a hospital or clinic and will not be stored at home. NOTE: This sheet is a summary. It may not cover all possible information. If you have questions  about this medicine, talk to your doctor, pharmacist, or health care provider.  2020 [employer]/Gold Standard (2016-10-02 11:01:26)

## 2018-09-22 NOTE — Telephone Encounter (Signed)
No change in appts per 7/21 los

## 2018-09-29 ENCOUNTER — Encounter: Payer: Self-pay | Admitting: Hematology

## 2018-09-30 ENCOUNTER — Encounter: Payer: Self-pay | Admitting: Hematology

## 2018-10-13 ENCOUNTER — Inpatient Hospital Stay (HOSPITAL_BASED_OUTPATIENT_CLINIC_OR_DEPARTMENT_OTHER): Payer: Medicare Other | Admitting: Hematology

## 2018-10-13 ENCOUNTER — Other Ambulatory Visit: Payer: Self-pay

## 2018-10-13 ENCOUNTER — Telehealth: Payer: Self-pay | Admitting: Hematology

## 2018-10-13 ENCOUNTER — Inpatient Hospital Stay: Payer: Medicare Other

## 2018-10-13 ENCOUNTER — Encounter: Payer: Self-pay | Admitting: *Deleted

## 2018-10-13 ENCOUNTER — Encounter: Payer: Self-pay | Admitting: Hematology

## 2018-10-13 ENCOUNTER — Inpatient Hospital Stay: Payer: Medicare Other | Attending: Hematology

## 2018-10-13 VITALS — BP 142/66 | HR 75 | Temp 97.1°F | Resp 18 | Ht 61.0 in | Wt 139.0 lb

## 2018-10-13 VITALS — BP 141/65 | HR 71 | Temp 97.5°F | Resp 18

## 2018-10-13 DIAGNOSIS — C8580 Other specified types of non-Hodgkin lymphoma, unspecified site: Secondary | ICD-10-CM

## 2018-10-13 DIAGNOSIS — Z881 Allergy status to other antibiotic agents status: Secondary | ICD-10-CM | POA: Diagnosis not present

## 2018-10-13 DIAGNOSIS — M545 Low back pain: Secondary | ICD-10-CM | POA: Diagnosis not present

## 2018-10-13 DIAGNOSIS — Z888 Allergy status to other drugs, medicaments and biological substances status: Secondary | ICD-10-CM | POA: Diagnosis not present

## 2018-10-13 DIAGNOSIS — R911 Solitary pulmonary nodule: Secondary | ICD-10-CM | POA: Diagnosis not present

## 2018-10-13 DIAGNOSIS — T451X5A Adverse effect of antineoplastic and immunosuppressive drugs, initial encounter: Secondary | ICD-10-CM

## 2018-10-13 DIAGNOSIS — D649 Anemia, unspecified: Secondary | ICD-10-CM | POA: Diagnosis not present

## 2018-10-13 DIAGNOSIS — R110 Nausea: Secondary | ICD-10-CM | POA: Insufficient documentation

## 2018-10-13 DIAGNOSIS — Z79899 Other long term (current) drug therapy: Secondary | ICD-10-CM | POA: Diagnosis not present

## 2018-10-13 DIAGNOSIS — Z5111 Encounter for antineoplastic chemotherapy: Secondary | ICD-10-CM | POA: Diagnosis not present

## 2018-10-13 DIAGNOSIS — J984 Other disorders of lung: Secondary | ICD-10-CM | POA: Insufficient documentation

## 2018-10-13 DIAGNOSIS — N179 Acute kidney failure, unspecified: Secondary | ICD-10-CM | POA: Diagnosis not present

## 2018-10-13 DIAGNOSIS — R590 Localized enlarged lymph nodes: Secondary | ICD-10-CM | POA: Diagnosis not present

## 2018-10-13 DIAGNOSIS — I700 Atherosclerosis of aorta: Secondary | ICD-10-CM | POA: Diagnosis not present

## 2018-10-13 DIAGNOSIS — N183 Chronic kidney disease, stage 3 unspecified: Secondary | ICD-10-CM

## 2018-10-13 DIAGNOSIS — Z5112 Encounter for antineoplastic immunotherapy: Secondary | ICD-10-CM | POA: Diagnosis not present

## 2018-10-13 DIAGNOSIS — R194 Change in bowel habit: Secondary | ICD-10-CM | POA: Insufficient documentation

## 2018-10-13 DIAGNOSIS — C8590 Non-Hodgkin lymphoma, unspecified, unspecified site: Secondary | ICD-10-CM | POA: Diagnosis not present

## 2018-10-13 DIAGNOSIS — Z5189 Encounter for other specified aftercare: Secondary | ICD-10-CM | POA: Insufficient documentation

## 2018-10-13 LAB — CBC WITH DIFFERENTIAL (CANCER CENTER ONLY)
Abs Immature Granulocytes: 0.08 10*3/uL — ABNORMAL HIGH (ref 0.00–0.07)
Basophils Absolute: 0.1 10*3/uL (ref 0.0–0.1)
Basophils Relative: 2 %
Eosinophils Absolute: 0.1 10*3/uL (ref 0.0–0.5)
Eosinophils Relative: 2 %
HCT: 32.7 % — ABNORMAL LOW (ref 36.0–46.0)
Hemoglobin: 10.6 g/dL — ABNORMAL LOW (ref 12.0–15.0)
Immature Granulocytes: 2 %
Lymphocytes Relative: 12 %
Lymphs Abs: 0.6 10*3/uL — ABNORMAL LOW (ref 0.7–4.0)
MCH: 28.2 pg (ref 26.0–34.0)
MCHC: 32.4 g/dL (ref 30.0–36.0)
MCV: 87 fL (ref 80.0–100.0)
Monocytes Absolute: 0.8 10*3/uL (ref 0.1–1.0)
Monocytes Relative: 16 %
Neutro Abs: 3.2 10*3/uL (ref 1.7–7.7)
Neutrophils Relative %: 66 %
Platelet Count: 266 10*3/uL (ref 150–400)
RBC: 3.76 MIL/uL — ABNORMAL LOW (ref 3.87–5.11)
RDW: 15.7 % — ABNORMAL HIGH (ref 11.5–15.5)
WBC Count: 4.8 10*3/uL (ref 4.0–10.5)
nRBC: 0 % (ref 0.0–0.2)

## 2018-10-13 LAB — CMP (CANCER CENTER ONLY)
ALT: 9 U/L (ref 0–44)
AST: 12 U/L — ABNORMAL LOW (ref 15–41)
Albumin: 3.7 g/dL (ref 3.5–5.0)
Alkaline Phosphatase: 62 U/L (ref 38–126)
Anion gap: 9 (ref 5–15)
BUN: 20 mg/dL (ref 8–23)
CO2: 26 mmol/L (ref 22–32)
Calcium: 8.5 mg/dL — ABNORMAL LOW (ref 8.9–10.3)
Chloride: 107 mmol/L (ref 98–111)
Creatinine: 0.88 mg/dL (ref 0.44–1.00)
GFR, Est AFR Am: >60 mL/min (ref >60)
GFR, Estimated: >60 mL/min (ref >60)
Glucose, Bld: 103 mg/dL — ABNORMAL HIGH (ref 70–99)
Potassium: 3.9 mmol/L (ref 3.5–5.1)
Sodium: 142 mmol/L (ref 135–145)
Total Bilirubin: 0.3 mg/dL (ref 0.3–1.2)
Total Protein: 5.4 g/dL — ABNORMAL LOW (ref 6.5–8.1)

## 2018-10-13 LAB — MAGNESIUM: Magnesium: 1.9 mg/dL (ref 1.7–2.4)

## 2018-10-13 MED ORDER — PALONOSETRON HCL INJECTION 0.25 MG/5ML
INTRAVENOUS | Status: AC
  Filled 2018-10-13: qty 5

## 2018-10-13 MED ORDER — HOT PACK MISC ONCOLOGY
1.0000 | Freq: Once | Status: DC | PRN
  Filled 2018-10-13: qty 1

## 2018-10-13 MED ORDER — PEGFILGRASTIM 6 MG/0.6ML ~~LOC~~ PSKT
PREFILLED_SYRINGE | SUBCUTANEOUS | Status: AC
  Filled 2018-10-13: qty 0.6

## 2018-10-13 MED ORDER — SODIUM CHLORIDE 0.9 % IV SOLN
400.0000 mg/m2 | Freq: Once | INTRAVENOUS | Status: AC
  Administered 2018-10-13: 660 mg via INTRAVENOUS
  Filled 2018-10-13: qty 33

## 2018-10-13 MED ORDER — SODIUM CHLORIDE 0.9 % IV SOLN
Freq: Once | INTRAVENOUS | Status: AC
  Administered 2018-10-13: 10:00:00 via INTRAVENOUS
  Filled 2018-10-13: qty 250

## 2018-10-13 MED ORDER — COLD PACK MISC ONCOLOGY
1.0000 | Freq: Once | Status: DC | PRN
  Filled 2018-10-13: qty 1

## 2018-10-13 MED ORDER — SODIUM CHLORIDE 0.9 % IV SOLN
375.0000 mg/m2 | Freq: Once | INTRAVENOUS | Status: AC
  Administered 2018-10-13: 600 mg via INTRAVENOUS
  Filled 2018-10-13: qty 50

## 2018-10-13 MED ORDER — DOXORUBICIN HCL CHEMO IV INJECTION 2 MG/ML
25.0000 mg/m2 | Freq: Once | INTRAVENOUS | Status: AC
  Administered 2018-10-13: 42 mg via INTRAVENOUS
  Filled 2018-10-13: qty 21

## 2018-10-13 MED ORDER — DEXAMETHASONE SODIUM PHOSPHATE 10 MG/ML IJ SOLN
INTRAMUSCULAR | Status: AC
  Filled 2018-10-13: qty 1

## 2018-10-13 MED ORDER — PALONOSETRON HCL INJECTION 0.25 MG/5ML
0.2500 mg | Freq: Once | INTRAVENOUS | Status: AC
  Administered 2018-10-13: 0.25 mg via INTRAVENOUS

## 2018-10-13 MED ORDER — DIPHENHYDRAMINE HCL 25 MG PO CAPS
50.0000 mg | ORAL_CAPSULE | Freq: Once | ORAL | Status: AC
  Administered 2018-10-13: 10:00:00 50 mg via ORAL

## 2018-10-13 MED ORDER — DIPHENHYDRAMINE HCL 25 MG PO CAPS
ORAL_CAPSULE | ORAL | Status: AC
  Filled 2018-10-13: qty 2

## 2018-10-13 MED ORDER — ACETAMINOPHEN 325 MG PO TABS
650.0000 mg | ORAL_TABLET | Freq: Once | ORAL | Status: AC
  Administered 2018-10-13: 10:00:00 650 mg via ORAL

## 2018-10-13 MED ORDER — HEPARIN SOD (PORK) LOCK FLUSH 100 UNIT/ML IV SOLN
500.0000 [IU] | Freq: Once | INTRAVENOUS | Status: AC | PRN
  Administered 2018-10-13: 500 [IU]
  Filled 2018-10-13: qty 5

## 2018-10-13 MED ORDER — DEXAMETHASONE SODIUM PHOSPHATE 10 MG/ML IJ SOLN
10.0000 mg | Freq: Once | INTRAMUSCULAR | Status: AC
  Administered 2018-10-13: 10:00:00 10 mg via INTRAVENOUS

## 2018-10-13 MED ORDER — VINCRISTINE SULFATE CHEMO INJECTION 1 MG/ML
1.0000 mg | Freq: Once | INTRAVENOUS | Status: AC
  Administered 2018-10-13: 1 mg via INTRAVENOUS
  Filled 2018-10-13: qty 1

## 2018-10-13 MED ORDER — SODIUM CHLORIDE 0.9% FLUSH
10.0000 mL | INTRAVENOUS | Status: DC | PRN
  Administered 2018-10-13: 10 mL
  Filled 2018-10-13: qty 10

## 2018-10-13 MED ORDER — PEGFILGRASTIM 6 MG/0.6ML ~~LOC~~ PSKT
6.0000 mg | PREFILLED_SYRINGE | Freq: Once | SUBCUTANEOUS | Status: AC
  Administered 2018-10-13: 6 mg via SUBCUTANEOUS

## 2018-10-13 MED ORDER — ACETAMINOPHEN 325 MG PO TABS
ORAL_TABLET | ORAL | Status: AC
  Filled 2018-10-13: qty 2

## 2018-10-13 NOTE — Progress Notes (Signed)
[place] OFFICE PROGRESS NOTE  Patient Care Team: Carollee Herter, Alferd Apa, DO as PCP - General (Family Medicine) Jari Pigg, MD as Consulting Physician (Dermatology) Monna Fam, MD as Consulting Physician (Ophthalmology) Terrance Mass, MD (Inactive) as Consulting Physician (Gynecology) Cordelia Poche, RN as Oncology Nurse Navigator Tish Men, MD as Medical Oncologist (Hematology)  HEME/ONC OVERVIEW: 1. 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 CT chest showed pathologic lymphadenopathy involving bilateral axilla, mediastinum as well as multiple small lung nodules and possible splenomegaly  ? PET showed FDG-avid lymphadenopathy in neck, chest, abdomen and pelvis, questionable involvement of the lung, and focal involvement of left scapula and acetabulum  ? Left axillary LN excisional bx: NHL, favoring low-grade marginal zone lymphoma; Cyclin-D1 neg ([facility]) -06/2018: admitted for AKI secondary to hypercalcemia (Ca 13); LVEF normal; second opinion at [facility] recommended mini R-CHOP for suspected DLBCL transformation -07/2018 - present: mini-R-CHOP with Onpro  Interim PET (after 3 cycles) showed resolution of nearly all FDG avidity within previously involved LN's (Deauville criteria 2 and 3); no residual or new disease    TREATMENT REGIMEN:  07/21/2018 - present: mini-R-CHOP with Onpro; plan for 6 cycles    ASSESSMENT & PLAN:    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  -S/p 4 cycle of mini-R-CHOP; very good response after 3 cycles of chemotherapy  -Labs adequate, proceed with Cycle 5 of chemotherapy  -Plan for a total of 6 cycles of treatment -After chemotherapy, we will repeat PET in ~1 month to assess end-of-treatment response  -Prophylaxis: acyclovir, allopurinol -PRN anti-emetics: Zofran and Compazine    Chemotherapy-related bone pain -Secondary to G-CSF -I discussed with  patient about OTC daily Zytec, which can help with G-CSF related bone pain -Okay with PRN Tylenol, but patient understands about avoiding excess use to avoid masking fever   Normocytic anemia -Multifactorial, including bone marrow involvement by lymphoma and chemotherapy -Hgb 10.6 oday, stable -Patient denies any symptoms of bleeding  -We will monitor it for now     Chemotherapy-associated nausea  -Secondary to chemotherapy -Intermittent nausea without vomiting, well controlled  -Continue PRN-anti-emetics   Stage III CKD -Baseline Cr ~1.1 -Cr 0.88 today; electrolytes normal -I counseled the patient on the importance of maintaining adequate hydration and avoid NSAIDs  -We will monitor it for now   No orders of the defined types were placed in this encounter.  All questions were answered. The patient knows to call the clinic with any problems, questions or concerns. No barriers to learning was detected.  Return in 3 weeks for labs, port flush, clinic appt and Cycle 6 of chemotherapy.   Tish Men, MD 10/13/2018 10:26 AM  CHIEF COMPLAINT: "I am just a little tired"  INTERVAL HISTORY: Amanda [name] returns to clinic for follow-up of marginal zone lymphoma w/ suspected transformed DLBCL on mini R-CHOP.  Patient reports that she had some bone pain in her low back and started on after the last cycle of chemotherapy, for which she took 1 dose of Tylenol with resolution of the pain.  She denies any fever, chill, night sweats, weight change, or recurrent lymphadenopathy.  She also has had occasional nausea without any vomiting, for which she takes Zofran with relief of the symptom.  She has had some mild loose stool, but denies any abdominal pain, nausea, vomiting, or diarrhea.  She denies any other complaint today.  SUMMARY OF ONCOLOGIC HISTORY: Oncology History  Marginal  zone lymphoma ([place])  05/21/2018 Initial Diagnosis   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[place])   07/21/2018 -  Chemotherapy   The  patient had DOXOrubicin (ADRIAMYCIN) chemo injection 42 mg, 25 mg/m2 = 42 mg (100 % of original dose 25 mg/m2), Intravenous,  Once, 5 of 6 cycles Dose modification: 25 mg/m2 (original dose 25 mg/m2, Cycle 1, Reason: Patient Age) Administration: 42 mg (07/21/2018), 42 mg (08/11/2018), 42 mg (09/01/2018), 42 mg (09/22/2018) palonosetron (ALOXI) injection 0.25 mg, 0.25 mg, Intravenous,  Once, 5 of 6 cycles Administration: 0.25 mg (07/21/2018), 0.25 mg (08/11/2018), 0.25 mg (09/01/2018), 0.25 mg (09/22/2018) pegfilgrastim (NEULASTA ONPRO KIT) injection 6 mg, 6 mg, Subcutaneous, Once, 5 of 6 cycles Administration: 6 mg (07/21/2018), 6 mg (08/11/2018), 6 mg (09/01/2018), 6 mg (09/22/2018) vinCRIStine (ONCOVIN) 1 mg in sodium chloride 0.9 % 50 mL chemo infusion, 1 mg (100 % of original dose 1 mg), Intravenous,  Once, 5 of 6 cycles Dose modification: 1 mg (original dose 1 mg, Cycle 1, Reason: Patient Age) Administration: 1 mg (07/21/2018), 1 mg (08/11/2018), 1 mg (09/01/2018), 1 mg (09/22/2018) riTUXimab (RITUXAN) 600 mg in sodium chloride 0.9 % 250 mL (1.9355 mg/mL) infusion, 375 mg/m2 = 600 mg, Intravenous,  Once, 1 of 1 cycle Administration: 600 mg (07/21/2018) cyclophosphamide (CYTOXAN) 660 mg in sodium chloride 0.9 % 250 mL chemo infusion, 400 mg/m2 = 660 mg (100 % of original dose 400 mg/m2), Intravenous,  Once, 5 of 6 cycles Dose modification: 400 mg/m2 (original dose 400 mg/m2, Cycle 1, Reason: Patient Age) Administration: 660 mg (07/21/2018), 660 mg (08/11/2018), 660 mg (09/01/2018), 660 mg (09/22/2018) riTUXimab (RITUXAN) 600 mg in sodium chloride 0.9 % 190 mL infusion, 375 mg/m2 = 600 mg (100 % of original dose 375 mg/m2), Intravenous,  Once, 4 of 5 cycles Dose modification: 375 mg/m2 (original dose 375 mg/m2, Cycle 2) Administration: 600 mg (08/11/2018), 600 mg (09/01/2018), 600 mg (09/22/2018)  for chemotherapy treatment.      REVIEW OF SYSTEMS:   Constitutional: ( - ) fevers, ( - )  chills , ( - ) night sweats Eyes:  ( - ) blurriness of vision, ( - ) double vision, ( - ) watery eyes Ears, nose, mouth, throat, and face: ( - ) mucositis, ( - ) sore throat Respiratory: ( - ) cough, ( - ) dyspnea, ( - ) wheezes Cardiovascular: ( - ) palpitation, ( - ) chest discomfort, ( - ) lower extremity swelling Gastrointestinal:  ( + ) nausea, ( - ) heartburn, ( + ) change in bowel habits Skin: ( - ) abnormal skin rashes Lymphatics: ( - ) new lymphadenopathy, ( - ) easy bruising Neurological: ( - ) numbness, ( - ) tingling, ( - ) new weaknesses Behavioral/Psych: ( - ) mood change, ( - ) new changes  All other systems were reviewed with the patient and are negative.  I have reviewed the past medical history, past surgical history, social history and family history with the patient and they are unchanged from previous note.  ALLERGIES:  is allergic to phenergan [promethazine hcl]; levofloxacin; and pravastatin sodium.  MEDICATIONS:  Current Outpatient Medications  Medication Sig Dispense Refill  . acetaminophen (TYLENOL) 500 MG tablet Take 1,000 mg by mouth every 6 (six) hours as needed for mild pain.    [name] albuterol (PROVENTIL) (2.5 MG/3ML) 0.083% nebulizer solution Take 3 mLs (2.5 mg total) by nebulization every 6 (six) hours as needed for wheezing or shortness of breath. 75 mL 12  . amLODipine (NORVASC) 5 MG  tablet Take 1 tablet (5 mg total) by mouth daily with breakfast. 90 tablet 1  . buPROPion (WELLBUTRIN XL) 150 MG 24 hr tablet TAKE 1 TABLET BY MOUTH EVERY DAY 90 tablet 1  . famotidine (PEPCID) 20 MG tablet One at bedtime (Patient taking differently: Take 20 mg by mouth at bedtime. One at bedtime ) 30 tablet 11  . feeding supplement, ENSURE ENLIVE, (ENSURE ENLIVE) LIQD Take 237 mLs by mouth 2 (two) times daily between meals. 60 Bottle 0  . levothyroxine (SYNTHROID) 125 MCG tablet TAKE 1 TABLET BY MOUTH DAILY 90 tablet 0  . lidocaine (XYLOCAINE) 2 % solution     . lidocaine-prilocaine (EMLA) cream Apply to affected  area once 30 g 3  . magic mouthwash w/lidocaine SOLN Take 5 mLs by mouth 4 (four) times daily as needed for mouth pain. Swish and Spit 240 mL 0  . omeprazole (PRILOSEC) 20 MG capsule Take 2 x 30 min before breakfast (Patient taking differently: Take 20 mg by mouth daily. )    . ondansetron (ZOFRAN) 8 MG tablet Take 1 tablet (8 mg total) by mouth 2 (two) times daily as needed for refractory nausea / vomiting. Start on day 3 after cyclophosphamide chemotherapy. 30 tablet 1  . predniSONE (DELTASONE) 20 MG tablet TK 3 AND 1/2 TS PO D FOR 5 DAYS. TK ON DAYS 1 THROUGH 5 OF CHEMOTHERAPY    . prochlorperazine (COMPAZINE) 10 MG tablet Take 1 tablet (10 mg total) by mouth every 6 (six) hours as needed (Nausea or vomiting). 30 tablet 6  . traZODone (DESYREL) 50 MG tablet Take 1 tablet (50 mg total) by mouth at bedtime as needed for sleep. 30 tablet 1   No current facility-administered medications for this visit.    Facility-Administered Medications Ordered in Other Visits  Medication Dose Route Frequency Provider Last Rate Last Dose  . Cold Pack 1 packet  1 packet Topical Once PRN Tish Men, MD      . cyclophosphamide (CYTOXAN) 660 mg in sodium chloride 0.9 % 250 mL chemo infusion  400 mg/m2 (Treatment Plan Recorded) Intravenous Once Tish Men, MD      . dexamethasone (DECADRON) injection 10 mg  10 mg Intravenous Once Tish Men, MD      . DOXOrubicin (ADRIAMYCIN) chemo injection 42 mg  25 mg/m2 (Treatment Plan Recorded) Intravenous Once Tish Men, MD      . heparin lock flush 100 unit/mL  500 Units Intracatheter Once PRN Tish Men, MD      . Hot Pack 1 packet  1 packet Topical Once PRN Tish Men, MD      . palonosetron (ALOXI) injection 0.25 mg  0.25 mg Intravenous Once Tish Men, MD      . pegfilgrastim (NEULASTA ONPRO KIT) injection 6 mg  6 mg Subcutaneous Once Tish Men, MD      . riTUXimab (RITUXAN) 600 mg in sodium chloride 0.9 % 190 mL infusion  375 mg/m2 (Treatment Plan Recorded) Intravenous Once  Tish Men, MD      . sodium chloride flush (NS) 0.9 % injection 10 mL  10 mL Intracatheter PRN Tish Men, MD      . vinCRIStine (ONCOVIN) 1 mg in sodium chloride 0.9 % 50 mL chemo infusion  1 mg Intravenous Once Tish Men, MD        PHYSICAL EXAMINATION: ECOG PERFORMANCE STATUS: 1 - Symptomatic but completely ambulatory  Today's Vitals   10/13/18 0932  BP: (!) 142/66  Pulse: 75  Resp: 18  Temp: (!) 97.1 F (36.2 C)  TempSrc: Temporal  SpO2: 93%  Weight: 139 lb (63 kg)  Height: '5\' 1"'  (1.549 m)  PainSc: 0-No pain   Body mass index is 26.26 kg/m.  Filed Weights   10/13/18 0932  Weight: 139 lb (63 kg)    GENERAL: alert, no distress and comfortable SKIN: skin color, texture, turgor are normal, no rashes or significant lesions EYES: conjunctiva are pink and non-injected, sclera clear OROPHARYNX: no exudate, no erythema; lips, buccal mucosa, and tongue normal  NECK: supple, non-tender LUNGS: clear to auscultation with normal breathing effort HEART: regular rate & rhythm and no murmurs and no lower extremity edema ABDOMEN: soft, non-tender, non-distended, normal bowel sounds Musculoskeletal: no cyanosis of digits and no clubbing  PSYCH: alert & oriented x 3, fluent speech NEURO: no focal motor/sensory deficits  LABORATORY DATA:  I have reviewed the data as listed    Component Value Date/Time   NA 142 10/13/2018 0920   NA 129 (A) 06/23/2018   K 3.9 10/13/2018 0920   CL 107 10/13/2018 0920   CO2 26 10/13/2018 0920   GLUCOSE 103 (H) 10/13/2018 0920   BUN 20 10/13/2018 0920   BUN 14 06/23/2018   CREATININE 0.88 10/13/2018 0920   CREATININE 1.36 (H) 10/27/2015 1530   CALCIUM 8.5 (L) 10/13/2018 0920   CALCIUM 12.3 (H) 06/16/2018 1639   PROT 5.4 (L) 10/13/2018 0920   ALBUMIN 3.7 10/13/2018 0920   AST 12 (L) 10/13/2018 0920   ALT 9 10/13/2018 0920   ALKPHOS 62 10/13/2018 0920   BILITOT 0.3 10/13/2018 0920   GFRNONAA >60 10/13/2018 0920   GFRAA >60 10/13/2018 0920     No results found for: SPEP, UPEP  Lab Results  Component Value Date   WBC 4.8 10/13/2018   NEUTROABS 3.2 10/13/2018   HGB 10.6 (L) 10/13/2018   HCT 32.7 (L) 10/13/2018   MCV 87.0 10/13/2018   PLT 266 10/13/2018      Chemistry      Component Value Date/Time   NA 142 10/13/2018 0920   NA 129 (A) 06/23/2018   K 3.9 10/13/2018 0920   CL 107 10/13/2018 0920   CO2 26 10/13/2018 0920   BUN 20 10/13/2018 0920   BUN 14 06/23/2018   CREATININE 0.88 10/13/2018 0920   CREATININE 1.36 (H) 10/27/2015 1530   GLU 91 06/23/2018      Component Value Date/Time   CALCIUM 8.5 (L) 10/13/2018 0920   CALCIUM 12.3 (H) 06/16/2018 1639   ALKPHOS 62 10/13/2018 0920   AST 12 (L) 10/13/2018 0920   ALT 9 10/13/2018 0920   BILITOT 0.3 10/13/2018 0920       RADIOGRAPHIC STUDIES: I have personally reviewed the radiological images as listed below and agreed with the findings in the report. Nm Pet Image Restag (ps) Skull Base To Thigh  Result Date: 09/16/2018 CLINICAL DATA:  Subsequent treatment strategy for lymphoma. EXAM: NUCLEAR MEDICINE PET SKULL BASE TO THIGH TECHNIQUE: 6.9 mCi F-18 FDG was injected intravenously. Full-ring PET imaging was performed from the skull base to thigh after the radiotracer. CT data was obtained and used for attenuation correction and anatomic localization. Fasting blood glucose: 95 mg/dl COMPARISON:  05/29/2018 FINDINGS: Mediastinal blood pool activity: SUV max 2.14 Liver activity: SUV max 3.31 NECK: No hypermetabolic lymph nodes in the neck. Incidental CT findings: none CHEST: Interval resolution of previous bilateral supraclavicular and axillary adenopathy. Index lymph node within the left axilla measures 0.8 cm and has an SUV  max of 1.37. Previously 1.4 cm with SUV max of 10.29 Interval resolution of mediastinal and hilar adenopathy. Index left pre-vascular lymph node measures 0.8 cm with SUV max of 2.47. This is compared with 1.6 cm previously with SUV max of 11.53.  Incidental CT findings: Right apex lung scarring. Small nodule in the anterolateral left upper lobe measures 4 mm and is too small to characterize. Nonspecific, but new from previous exam. No FDG avid pulmonary nodules identified. Aortic atherosclerosis. ABDOMEN/PELVIS: No abnormal hypermetabolic activity within the liver, pancreas, adrenal glands, or spleen. No hypermetabolic lymph nodes in the abdomen or pelvis. Previous index aortocaval node measures 6 mm on today's exam with SUV max of 1.96. Index left retroperitoneal node measures 0.4 cm. With SUV max of 1.31. Previously 0.9 cm with SUV max of 8.49. Index right external iliac node measures 0.5 cm within SUV max of 1.3. Previously 1.5 cm within SUV max of 12.3. Index left inguinal lymph node measures 0.4 cm within SUV max of 0.6. Previously 6 mm with SUV max of 5.12. The spleen measures 10 cm in length and has an SUV max of 2.5. On the previous exam the spleen measured 12.3 cm with SUV max of 6.0. Incidental CT findings: Aortic atherosclerosis noted.  No aneurysm. SKELETON: Previous asymmetric uptake within the left acetabulum has an SUV max of 2.72 on the current exam. Previously 3.95. Previous asymmetric uptake within the tip of the left scapula has an SUV max of 1.5 on today's study. Previously 3.3. Incidental CT findings: none IMPRESSION: 1. Interval resolution of hypermetabolic adenopathy within the neck, chest, abdomen and pelvis. Mild residual areas of increased uptake within subcentimeter lymph nodes are compatible with Deauville criteria 2 and 3 lesions. No findings to suggest residual metabolically active disease at this time. No new sites of disease identified 2. Small nonspecific nodule in the left upper lobe measuring 4 mm. New, nonspecific. Attention on follow-up imaging advised. 3.  Aortic Atherosclerosis (ICD10-I70.0). Electronically Signed   By: Kerby Moors M.D.   On: 09/16/2018 12:23

## 2018-10-13 NOTE — Telephone Encounter (Signed)
No change in appts  Per 8/11 los

## 2018-10-13 NOTE — Patient Instructions (Signed)
[facility] Discharge Instructions for Patients Receiving Chemotherapy  Today you received the following chemotherapy agents Adriamycin, Vincristine, Cytoxan, Rituxan.  To help prevent nausea and vomiting after your treatment, we encourage you to take your nausea medication as indicated by your MD.   If you develop nausea and vomiting that is not controlled by your nausea medication, call the clinic.   BELOW ARE SYMPTOMS THAT SHOULD BE REPORTED IMMEDIATELY:  *FEVER GREATER THAN 100.5 F  *CHILLS WITH OR WITHOUT FEVER  NAUSEA AND VOMITING THAT IS NOT CONTROLLED WITH YOUR NAUSEA MEDICATION  *UNUSUAL SHORTNESS OF BREATH  *UNUSUAL BRUISING OR BLEEDING  TENDERNESS IN MOUTH AND THROAT WITH OR WITHOUT PRESENCE OF ULCERS  *URINARY PROBLEMS  *BOWEL PROBLEMS  UNUSUAL RASH Items with * indicate a potential emergency and should be followed up as soon as possible.  Feel free to call the clinic should you have any questions or concerns. The clinic phone number is [phone].  Please show the [facility] at check-in to the Emergency Department and triage nurse.  Pegfilgrastim injection What is this medicine? PEGFILGRASTIM (PEG fil gra stim) is a long-acting granulocyte colony-stimulating factor that stimulates the growth of neutrophils, a type of white blood cell important in the body's fight against infection. It is used to reduce the incidence of fever and infection in patients with certain types of cancer who are receiving chemotherapy that affects the bone marrow, and to increase survival after being exposed to high doses of radiation. This medicine may be used for other purposes; ask your health care provider or pharmacist if you have questions. COMMON BRAND NAME(S): Steve Rattler, Ziextenzo What should I tell my health care provider before I take this medicine? They need to know if you have any of these conditions:  kidney disease  latex  allergy  ongoing radiation therapy  sickle cell disease  skin reactions to acrylic adhesives (On-Body Injector only)  an unusual or allergic reaction to pegfilgrastim, filgrastim, other medicines, foods, dyes, or preservatives  pregnant or trying to get pregnant  breast-feeding How should I use this medicine? This medicine is for injection under the skin. If you get this medicine at home, you will be taught how to prepare and give the pre-filled syringe or how to use the On-body Injector. Refer to the patient Instructions for Use for detailed instructions. Use exactly as directed. Tell your healthcare provider immediately if you suspect that the On-body Injector may not have performed as intended or if you suspect the use of the On-body Injector resulted in a missed or partial dose. It is important that you put your used needles and syringes in a special sharps container. Do not put them in a trash can. If you do not have a sharps container, call your pharmacist or healthcare provider to get one. Talk to your pediatrician regarding the use of this medicine in children. While this drug may be prescribed for selected conditions, precautions do apply. Overdosage: If you think you have taken too much of this medicine contact a poison control center or emergency room at once. NOTE: This medicine is only for you. Do not share this medicine with others. What if I miss a dose? It is important not to miss your dose. Call your doctor or health care professional if you miss your dose. If you miss a dose due to an On-body Injector failure or leakage, a new dose should be administered as soon as possible using a single prefilled syringe  for manual use. What may interact with this medicine? Interactions have not been studied. Give your health care provider a list of all the medicines, herbs, non-prescription drugs, or dietary supplements you use. Also tell them if you smoke, drink alcohol, or use illegal  drugs. Some items may interact with your medicine. This list may not describe all possible interactions. Give your health care provider a list of all the medicines, herbs, non-prescription drugs, or dietary supplements you use. Also tell them if you smoke, drink alcohol, or use illegal drugs. Some items may interact with your medicine. What should I watch for while using this medicine? You may need blood work done while you are taking this medicine. If you are going to need a MRI, CT scan, or other procedure, tell your doctor that you are using this medicine (On-Body Injector only). What side effects may I notice from receiving this medicine? Side effects that you should report to your doctor or health care professional as soon as possible:  allergic reactions like skin rash, itching or hives, swelling of the face, lips, or tongue  back pain  dizziness  fever  pain, redness, or irritation at site where injected  pinpoint red spots on the skin  red or dark-brown urine  shortness of breath or breathing problems  stomach or side pain, or pain at the shoulder  swelling  tiredness  trouble passing urine or change in the amount of urine Side effects that usually do not require medical attention (report to your doctor or health care professional if they continue or are bothersome):  bone pain  muscle pain This list may not describe all possible side effects. Call your doctor for medical advice about side effects. You may report side effects to FDA at 1-800-FDA-1088. Where should I keep my medicine? Keep out of the reach of children. If you are using this medicine at home, you will be instructed on how to store it. Throw away any unused medicine after the expiration date on the label. NOTE: This sheet is a summary. It may not cover all possible information. If you have questions about this medicine, talk to your doctor, pharmacist, or health care provider.  2020 [employer]  Standard (2017-05-26 16:57:08)

## 2018-11-03 ENCOUNTER — Inpatient Hospital Stay (HOSPITAL_BASED_OUTPATIENT_CLINIC_OR_DEPARTMENT_OTHER): Payer: Medicare Other | Admitting: Hematology

## 2018-11-03 ENCOUNTER — Encounter: Payer: Self-pay | Admitting: Hematology

## 2018-11-03 ENCOUNTER — Inpatient Hospital Stay: Payer: Medicare Other

## 2018-11-03 ENCOUNTER — Inpatient Hospital Stay: Payer: Medicare Other | Attending: Hematology

## 2018-11-03 ENCOUNTER — Telehealth: Payer: Self-pay | Admitting: Hematology

## 2018-11-03 ENCOUNTER — Other Ambulatory Visit: Payer: Self-pay

## 2018-11-03 ENCOUNTER — Encounter: Payer: Self-pay | Admitting: *Deleted

## 2018-11-03 VITALS — BP 146/70 | HR 60 | Temp 97.6°F | Resp 18 | Ht 61.0 in | Wt 140.0 lb

## 2018-11-03 DIAGNOSIS — D649 Anemia, unspecified: Secondary | ICD-10-CM | POA: Diagnosis not present

## 2018-11-03 DIAGNOSIS — C8304 Small cell B-cell lymphoma, lymph nodes of axilla and upper limb: Secondary | ICD-10-CM | POA: Insufficient documentation

## 2018-11-03 DIAGNOSIS — M898X9 Other specified disorders of bone, unspecified site: Secondary | ICD-10-CM | POA: Diagnosis not present

## 2018-11-03 DIAGNOSIS — Z5112 Encounter for antineoplastic immunotherapy: Secondary | ICD-10-CM | POA: Diagnosis not present

## 2018-11-03 DIAGNOSIS — M545 Low back pain: Secondary | ICD-10-CM | POA: Insufficient documentation

## 2018-11-03 DIAGNOSIS — Z5189 Encounter for other specified aftercare: Secondary | ICD-10-CM | POA: Diagnosis not present

## 2018-11-03 DIAGNOSIS — C8580 Other specified types of non-Hodgkin lymphoma, unspecified site: Secondary | ICD-10-CM

## 2018-11-03 DIAGNOSIS — N179 Acute kidney failure, unspecified: Secondary | ICD-10-CM | POA: Insufficient documentation

## 2018-11-03 DIAGNOSIS — Z5111 Encounter for antineoplastic chemotherapy: Secondary | ICD-10-CM | POA: Insufficient documentation

## 2018-11-03 DIAGNOSIS — N183 Chronic kidney disease, stage 3 unspecified: Secondary | ICD-10-CM

## 2018-11-03 DIAGNOSIS — Z888 Allergy status to other drugs, medicaments and biological substances status: Secondary | ICD-10-CM | POA: Diagnosis not present

## 2018-11-03 DIAGNOSIS — Z79899 Other long term (current) drug therapy: Secondary | ICD-10-CM | POA: Insufficient documentation

## 2018-11-03 DIAGNOSIS — Z881 Allergy status to other antibiotic agents status: Secondary | ICD-10-CM | POA: Insufficient documentation

## 2018-11-03 LAB — CMP (CANCER CENTER ONLY)
ALT: 8 U/L (ref 0–44)
AST: 14 U/L — ABNORMAL LOW (ref 15–41)
Albumin: 4 g/dL (ref 3.5–5.0)
Alkaline Phosphatase: 68 U/L (ref 38–126)
Anion gap: 6 (ref 5–15)
BUN: 26 mg/dL — ABNORMAL HIGH (ref 8–23)
CO2: 29 mmol/L (ref 22–32)
Calcium: 9.3 mg/dL (ref 8.9–10.3)
Chloride: 106 mmol/L (ref 98–111)
Creatinine: 0.92 mg/dL (ref 0.44–1.00)
GFR, Est AFR Am: >60 mL/min (ref >60)
GFR, Estimated: 59 mL/min — ABNORMAL LOW (ref >60)
Glucose, Bld: 85 mg/dL (ref 70–99)
Potassium: 4.2 mmol/L (ref 3.5–5.1)
Sodium: 141 mmol/L (ref 135–145)
Total Bilirubin: 0.3 mg/dL (ref 0.3–1.2)
Total Protein: 5.8 g/dL — ABNORMAL LOW (ref 6.5–8.1)

## 2018-11-03 LAB — CBC WITH DIFFERENTIAL (CANCER CENTER ONLY)
Band Neutrophils: 0 %
Basophils Absolute: 0.1 10*3/uL (ref 0.0–0.1)
Basophils Relative: 2 %
Eosinophils Absolute: 0.1 10*3/uL (ref 0.0–0.5)
Eosinophils Relative: 2 %
HCT: 32.7 % — ABNORMAL LOW (ref 36.0–46.0)
Hemoglobin: 10.7 g/dL — ABNORMAL LOW (ref 12.0–15.0)
Lymphocytes Relative: 8 %
Lymphs Abs: 0.5 10*3/uL — ABNORMAL LOW (ref 0.7–4.0)
MCH: 28.6 pg (ref 26.0–34.0)
MCHC: 32.7 g/dL (ref 30.0–36.0)
MCV: 87.4 fL (ref 80.0–100.0)
Monocytes Absolute: 0.8 10*3/uL (ref 0.1–1.0)
Monocytes Relative: 13 %
Neutro Abs: 4.1 10*3/uL (ref 1.7–7.7)
Neutrophils Relative %: 67 %
Platelet Count: 230 10*3/uL (ref 150–400)
RBC: 3.74 MIL/uL — ABNORMAL LOW (ref 3.87–5.11)
RDW: 14.4 % (ref 11.5–15.5)
WBC Count: 6 10*3/uL (ref 4.0–10.5)
nRBC: 0 % (ref 0.0–0.2)

## 2018-11-03 LAB — MAGNESIUM: Magnesium: 1.8 mg/dL (ref 1.7–2.4)

## 2018-11-03 MED ORDER — SODIUM CHLORIDE 0.9 % IV SOLN
375.0000 mg/m2 | Freq: Once | INTRAVENOUS | Status: AC
  Administered 2018-11-03: 600 mg via INTRAVENOUS
  Filled 2018-11-03: qty 50

## 2018-11-03 MED ORDER — HEPARIN SOD (PORK) LOCK FLUSH 100 UNIT/ML IV SOLN
500.0000 [IU] | Freq: Once | INTRAVENOUS | Status: AC | PRN
  Administered 2018-11-03: 15:00:00 500 [IU]
  Filled 2018-11-03: qty 5

## 2018-11-03 MED ORDER — PEGFILGRASTIM 6 MG/0.6ML ~~LOC~~ PSKT
6.0000 mg | PREFILLED_SYRINGE | Freq: Once | SUBCUTANEOUS | Status: AC
  Administered 2018-11-03: 15:00:00 6 mg via SUBCUTANEOUS

## 2018-11-03 MED ORDER — DOXORUBICIN HCL CHEMO IV INJECTION 2 MG/ML
25.0000 mg/m2 | Freq: Once | INTRAVENOUS | Status: AC
  Administered 2018-11-03: 42 mg via INTRAVENOUS
  Filled 2018-11-03: qty 21

## 2018-11-03 MED ORDER — PALONOSETRON HCL INJECTION 0.25 MG/5ML
INTRAVENOUS | Status: AC
  Filled 2018-11-03: qty 5

## 2018-11-03 MED ORDER — SODIUM CHLORIDE 0.9 % IV SOLN
Freq: Once | INTRAVENOUS | Status: AC
  Administered 2018-11-03: 11:00:00 via INTRAVENOUS
  Filled 2018-11-03: qty 250

## 2018-11-03 MED ORDER — DEXAMETHASONE SODIUM PHOSPHATE 10 MG/ML IJ SOLN
10.0000 mg | Freq: Once | INTRAMUSCULAR | Status: AC
  Administered 2018-11-03: 11:00:00 10 mg via INTRAVENOUS

## 2018-11-03 MED ORDER — ACETAMINOPHEN 325 MG PO TABS
650.0000 mg | ORAL_TABLET | Freq: Once | ORAL | Status: AC
  Administered 2018-11-03: 650 mg via ORAL

## 2018-11-03 MED ORDER — VINCRISTINE SULFATE CHEMO INJECTION 1 MG/ML
1.0000 mg | Freq: Once | INTRAVENOUS | Status: AC
  Administered 2018-11-03: 1 mg via INTRAVENOUS
  Filled 2018-11-03: qty 1

## 2018-11-03 MED ORDER — SODIUM CHLORIDE 0.9 % IV SOLN
400.0000 mg/m2 | Freq: Once | INTRAVENOUS | Status: AC
  Administered 2018-11-03: 660 mg via INTRAVENOUS
  Filled 2018-11-03: qty 33

## 2018-11-03 MED ORDER — PALONOSETRON HCL INJECTION 0.25 MG/5ML
0.2500 mg | Freq: Once | INTRAVENOUS | Status: AC
  Administered 2018-11-03: 11:00:00 0.25 mg via INTRAVENOUS

## 2018-11-03 MED ORDER — ACETAMINOPHEN 325 MG PO TABS
ORAL_TABLET | ORAL | Status: AC
  Filled 2018-11-03: qty 2

## 2018-11-03 MED ORDER — DIPHENHYDRAMINE HCL 25 MG PO CAPS
ORAL_CAPSULE | ORAL | Status: AC
  Filled 2018-11-03: qty 2

## 2018-11-03 MED ORDER — DIPHENHYDRAMINE HCL 25 MG PO CAPS
50.0000 mg | ORAL_CAPSULE | Freq: Once | ORAL | Status: AC
  Administered 2018-11-03: 12:00:00 50 mg via ORAL

## 2018-11-03 MED ORDER — DEXAMETHASONE SODIUM PHOSPHATE 10 MG/ML IJ SOLN
INTRAMUSCULAR | Status: AC
  Filled 2018-11-03: qty 1

## 2018-11-03 MED ORDER — PEGFILGRASTIM 6 MG/0.6ML ~~LOC~~ PSKT
PREFILLED_SYRINGE | SUBCUTANEOUS | Status: AC
  Filled 2018-11-03: qty 0.6

## 2018-11-03 MED ORDER — SODIUM CHLORIDE 0.9% FLUSH
10.0000 mL | INTRAVENOUS | Status: DC | PRN
  Administered 2018-11-03: 10 mL
  Filled 2018-11-03: qty 10

## 2018-11-03 NOTE — Telephone Encounter (Signed)
Spoke wit patient to confirm Oct appts per 9/1 LOS. Pt aware central radiology to contact to sch PET scan

## 2018-11-03 NOTE — Progress Notes (Signed)
[place] OFFICE PROGRESS NOTE  Patient Care Team: Carollee Herter, Alferd Apa, DO as PCP - General (Family Medicine) Jari Pigg, MD as Consulting Physician (Dermatology) Monna Fam, MD as Consulting Physician (Ophthalmology) Terrance Mass, MD (Inactive) as Consulting Physician (Gynecology) Cordelia Poche, RN as Oncology Nurse Navigator Tish Men, MD as Medical Oncologist (Hematology)  HEME/ONC OVERVIEW: 1. 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 CT chest showed pathologic lymphadenopathy involving bilateral axilla, mediastinum as well as multiple small lung nodules and possible splenomegaly  ? PET showed FDG-avid lymphadenopathy in neck, chest, abdomen and pelvis, questionable involvement of the lung, and focal involvement of left scapula and acetabulum  ? Left axillary LN excisional bx: NHL, favoring low-grade marginal zone lymphoma; Cyclin-D1 neg ([facility]) -06/2018: admitted for AKI secondary to hypercalcemia (Ca 13); LVEF normal; second opinion at [facility] recommended mini R-CHOP for suspected DLBCL transformation -07/2018 - present: mini-R-CHOP with Onpro  Interim PET (after 3 cycles) showed resolution of nearly all FDG avidity within previously involved LN's (Deauville criteria 2 and 3); no residual or new disease    TREATMENT REGIMEN:  07/21/2018 - present: mini-R-CHOP with Onpro; plan for 6 cycles    ASSESSMENT & PLAN:    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  -S/p 5 cycle of mini-R-CHOP; very good response after 3 cycles of chemotherapy  -Labs adequate, proceed with Cycle 6 of chemotherapy  -Plan for a total of 6 cycles of treatment -I have ordered PET scan in ~1 month to assess end-of-treatment response (late 11/2018 or early 12/2018) -Prophylaxis: acyclovir, allopurinol -PRN anti-emetics: Zofran and Compazine    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 NM PET Image Restag (PS) Skull Base To Thigh    Standing Status:   Future    Standing Expiration Date:   11/03/2019    Order Specific Question:   ** REASON FOR EXAM (FREE TEXT)    Answer:   Marginal zone lymphoma with suspicious DLBCL transformation, assess treatment response after chemo    Order Specific Question:   If indicated for the ordered procedure, I authorize the administration of a radiopharmaceutical per Radiology protocol    Answer:   Yes    Order Specific Question:   Preferred imaging location?    Answer:   [facility]    Order Specific Question:   Radiology Contrast Protocol - do NOT remove file path    Answer:   _[email or web address]    All questions were answered. The patient knows to call the clinic with any problems, questions or concerns. No barriers to learning was detected.  Return in early 12/2018 for labs, port flush, clinic appt and imaging results.   Tish Men, MD 11/03/2018 10:08 AM  CHIEF COMPLAINT: "I am doing fine"  INTERVAL HISTORY: Amanda [name] returns to clinic for follow-up of his transformed marginal zone lymphoma on mini R-CHOP.  Patient reports that she has intermittent low  back pain, non-radiating, for which she takes Tylenol sparingly with adequate pain control.  She otherwise has been tolerating treatment well, denies any fever, chest pain, dyspnea, abdominal pain, nausea, vomiting, or diarrhea.  She  denies any other complaint today.  SUMMARY OF ONCOLOGIC HISTORY: Oncology History  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[place])  05/21/2018 Initial Diagnosis   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[place])   07/21/2018 -  Chemotherapy   The patient had DOXOrubicin (ADRIAMYCIN) chemo injection 42 mg, 25 mg/m2 = 42 mg (100 % of original dose 25 mg/m2), Intravenous,  Once, 5 of 6 cycles Dose modification: 25 mg/m2 (original dose 25 mg/m2, Cycle 1, Reason: Patient Age) Administration: 42 mg (07/21/2018), 42 mg (08/11/2018), 42 mg (09/01/2018), 42 mg (09/22/2018), 42 mg (10/13/2018) palonosetron (ALOXI) injection 0.25 mg, 0.25 mg, Intravenous,  Once, 5 of 6 cycles Administration: 0.25 mg (07/21/2018), 0.25 mg (08/11/2018), 0.25 mg (09/01/2018), 0.25 mg (09/22/2018), 0.25 mg (10/13/2018) pegfilgrastim (NEULASTA ONPRO KIT) injection 6 mg, 6 mg, Subcutaneous, Once, 5 of 6 cycles Administration: 6 mg (07/21/2018), 6 mg (08/11/2018), 6 mg (09/01/2018), 6 mg (09/22/2018), 6 mg (10/13/2018) vinCRIStine (ONCOVIN) 1 mg in sodium chloride 0.9 % 50 mL chemo infusion, 1 mg (100 % of original dose 1 mg), Intravenous,  Once, 5 of 6 cycles Dose modification: 1 mg (original dose 1 mg, Cycle 1, Reason: Patient Age) Administration: 1 mg (07/21/2018), 1 mg (08/11/2018), 1 mg (09/01/2018), 1 mg (09/22/2018), 1 mg (10/13/2018) riTUXimab (RITUXAN) 600 mg in sodium chloride 0.9 % 250 mL (1.9355 mg/mL) infusion, 375 mg/m2 = 600 mg, Intravenous,  Once, 1 of 1 cycle Administration: 600 mg (07/21/2018) cyclophosphamide (CYTOXAN) 660 mg in sodium chloride 0.9 % 250 mL chemo infusion, 400 mg/m2 = 660 mg (100 % of original dose 400 mg/m2), Intravenous,  Once, 5 of 6 cycles Dose modification: 400 mg/m2 (original dose 400 mg/m2, Cycle 1, Reason: Patient Age) Administration: 660 mg (07/21/2018), 660 mg (08/11/2018), 660 mg (09/01/2018), 660 mg (09/22/2018), 660 mg (10/13/2018) riTUXimab (RITUXAN) 600 mg in sodium chloride 0.9 % 190 mL infusion, 375 mg/m2 = 600 mg (100 % of original dose 375  mg/m2), Intravenous,  Once, 4 of 5 cycles Dose modification: 375 mg/m2 (original dose 375 mg/m2, Cycle 2) Administration: 600 mg (08/11/2018), 600 mg (09/01/2018), 600 mg (09/22/2018), 600 mg (10/13/2018)  for chemotherapy treatment.      REVIEW OF SYSTEMS:   Constitutional: ( - ) fevers, ( - )  chills , ( - ) night sweats Eyes: ( - ) blurriness of vision, ( - ) double vision, ( - ) watery eyes Ears, nose, mouth, throat, and face: ( - ) mucositis, ( - ) sore throat Respiratory: ( - ) cough, ( - ) dyspnea, ( - ) wheezes Cardiovascular: ( - ) palpitation, ( - ) chest discomfort, ( - ) lower extremity swelling Gastrointestinal:  ( - ) nausea, ( - ) heartburn, ( - ) change in bowel habits Skin: ( - ) abnormal skin rashes Lymphatics: ( - ) new lymphadenopathy, ( - ) easy bruising Neurological: ( - ) numbness, ( - ) tingling, ( - ) new weaknesses Behavioral/Psych: ( - ) mood change, ( - ) new changes  All other systems were reviewed with the patient and are negative.  I have reviewed the past medical history, past surgical history, social history and family history with the patient and they are unchanged from previous note.  ALLERGIES:  is allergic to phenergan [promethazine hcl]; levofloxacin; and pravastatin sodium.  MEDICATIONS:  Current Outpatient  Medications  Medication Sig Dispense Refill  . acetaminophen (TYLENOL) 500 MG tablet Take 1,000 mg by mouth every 6 (six) hours as needed for mild pain.    [name] albuterol (PROVENTIL) (2.5 MG/3ML) 0.083% nebulizer solution Take 3 mLs (2.5 mg total) by nebulization every 6 (six) hours as needed for wheezing or shortness of breath. 75 mL 12  . allopurinol (ZYLOPRIM) 300 MG tablet Take 1 tablet by mouth daily.    [name] amLODipine (NORVASC) 5 MG tablet Take 1 tablet (5 mg total) by mouth daily with breakfast. 90 tablet 1  . buPROPion (WELLBUTRIN XL) 150 MG 24 hr tablet TAKE 1 TABLET BY MOUTH EVERY DAY 90 tablet 1  . famotidine (PEPCID) 20 MG tablet One at  bedtime (Patient taking differently: Take 20 mg by mouth at bedtime. One at bedtime ) 30 tablet 11  . feeding supplement, ENSURE ENLIVE, (ENSURE ENLIVE) LIQD Take 237 mLs by mouth 2 (two) times daily between meals. 60 Bottle 0  . levothyroxine (SYNTHROID) 125 MCG tablet TAKE 1 TABLET BY MOUTH DAILY 90 tablet 0  . lidocaine (XYLOCAINE) 2 % solution     . lidocaine-prilocaine (EMLA) cream Apply to affected area once 30 g 3  . magic mouthwash w/lidocaine SOLN Take 5 mLs by mouth 4 (four) times daily as needed for mouth pain. Swish and Spit 240 mL 0  . omeprazole (PRILOSEC) 20 MG capsule Take 2 x 30 min before breakfast (Patient taking differently: Take 20 mg by mouth daily. )    . ondansetron (ZOFRAN) 8 MG tablet Take 1 tablet (8 mg total) by mouth 2 (two) times daily as needed for refractory nausea / vomiting. Start on day 3 after cyclophosphamide chemotherapy. 30 tablet 1  . predniSONE (DELTASONE) 20 MG tablet TK 3 AND 1/2 TS PO D FOR 5 DAYS. TK ON DAYS 1 THROUGH 5 OF CHEMOTHERAPY    . prochlorperazine (COMPAZINE) 10 MG tablet Take 1 tablet (10 mg total) by mouth every 6 (six) hours as needed (Nausea or vomiting). 30 tablet 6  . traZODone (DESYREL) 50 MG tablet Take 1 tablet (50 mg total) by mouth at bedtime as needed for sleep. 30 tablet 1   No current facility-administered medications for this visit.     PHYSICAL EXAMINATION: ECOG PERFORMANCE STATUS: 2 - Symptomatic, <50% confined to bed  Today's Vitals   11/03/18 1006  BP: (!) 146/70  Pulse: 60  Resp: 18  Temp: 97.6 F (36.4 C)  TempSrc: Temporal  SpO2: 100%  Height: 5' 1" (1.549 m)  PainSc: 0-No pain   Body mass index is 26.26 kg/m.  There were no vitals filed for this visit.  GENERAL: alert, no distress and comfortable SKIN: skin color, texture, turgor are normal, no rashes or significant lesions EYES: conjunctiva are pink and non-injected, sclera clear OROPHARYNX: no exudate, no erythema; lips, buccal mucosa, and tongue  normal  NECK: supple, non-tender LYMPH:  no palpable lymphadenopathy in the cervical LUNGS: clear to auscultation with normal breathing effort HEART: regular rate & rhythm and no murmurs and no lower extremity edema ABDOMEN: soft, non-tender, non-distended, normal bowel sounds Musculoskeletal: no cyanosis of digits and no clubbing  PSYCH: alert & oriented x 3, fluent speech NEURO: no focal motor/sensory deficits  LABORATORY DATA:  I have reviewed the data as listed    Component Value Date/Time   NA 142 10/13/2018 0920   NA 129 (A) 06/23/2018   K 3.9 10/13/2018 0920   CL 107 10/13/2018 0920   CO2 26  10/13/2018 0920   GLUCOSE 103 (H) 10/13/2018 0920   BUN 20 10/13/2018 0920   BUN 14 06/23/2018   CREATININE 0.88 10/13/2018 0920   CREATININE 1.36 (H) 10/27/2015 1530   CALCIUM 8.5 (L) 10/13/2018 0920   CALCIUM 12.3 (H) 06/16/2018 1639   PROT 5.4 (L) 10/13/2018 0920   ALBUMIN 3.7 10/13/2018 0920   AST 12 (L) 10/13/2018 0920   ALT 9 10/13/2018 0920   ALKPHOS 62 10/13/2018 0920   BILITOT 0.3 10/13/2018 0920   GFRNONAA >60 10/13/2018 0920   GFRAA >60 10/13/2018 0920    No results found for: SPEP, UPEP  Lab Results  Component Value Date   WBC 6.0 11/03/2018   NEUTROABS PENDING 11/03/2018   HGB 10.7 (L) 11/03/2018   HCT 32.7 (L) 11/03/2018   MCV 87.4 11/03/2018   PLT 230 11/03/2018      Chemistry      Component Value Date/Time   NA 142 10/13/2018 0920   NA 129 (A) 06/23/2018   K 3.9 10/13/2018 0920   CL 107 10/13/2018 0920   CO2 26 10/13/2018 0920   BUN 20 10/13/2018 0920   BUN 14 06/23/2018   CREATININE 0.88 10/13/2018 0920   CREATININE 1.36 (H) 10/27/2015 1530   GLU 91 06/23/2018      Component Value Date/Time   CALCIUM 8.5 (L) 10/13/2018 0920   CALCIUM 12.3 (H) 06/16/2018 1639   ALKPHOS 62 10/13/2018 0920   AST 12 (L) 10/13/2018 0920   ALT 9 10/13/2018 0920   BILITOT 0.3 10/13/2018 0920

## 2018-11-03 NOTE — Patient Instructions (Signed)
Tunneled Central Venous Catheter Flushing Guide  It is important to flush your tunneled central venous catheter each time you use it, both before and after you use it. Flushing your catheter will help prevent it from clogging. What are the risks? Risks may include:  Infection.  Air getting into the catheter and bloodstream. Supplies needed:  A clean pair of gloves.  A disinfecting wipe. Use an alcohol wipe, chlorhexidine wipe, or iodine wipe as told by your health care provider.  A 10 mL syringe that has been prefilled with saline solution.  An empty 10 mL syringe, if a substance called heparin was injected into your catheter. How to flush your catheter When you flush your catheter, make sure you follow any specific instructions from your health care provider or the manufacturer. These are general guidelines. Flushing your catheter before use If there is heparin in your catheter: 1. Wash your hands with soap and water. 2. Put on gloves. 3. Scrub the injection cap for a minimum of 15 seconds with a disinfecting wipe. 4. Unclamp the catheter. 5. Attach the empty syringe to the injection cap. 6. Pull the syringe plunger back and withdraw 10 mL of blood. 7. Place the syringe into an appropriate waste container. 8. Scrub the injection cap for 15 seconds with a disinfecting wipe. 9. Attach the prefilled syringe to the injection cap. 10. Flush the catheter by pushing the plunger forward until all the liquid from the syringe is in the catheter. 11. Remove the syringe from the injection cap. 12. Clamp the catheter. If there is no heparin in your catheter: 1. Wash your hands with soap and water. 2. Put on gloves. 3. Scrub the injection cap for 15 seconds with a disinfecting wipe. 4. Unclamp the catheter. 5. Attach the prefilled syringe to the injection cap. 6. Flush the catheter by pushing the plunger forward until 5 mL of the liquid from the syringe is in the catheter. 7. Pull back on  the syringe until you see blood in the catheter. 8. If you have been asked to collect any blood, follow your health care provider's instructions. Otherwise, flush the catheter with the rest of the solution from the syringe. 9. Remove the syringe from the injection cap. 10. Clamp the catheter.  Flushing your catheter after use 1. Wash your hands with soap and water. 2. Put on gloves. 3. Scrub the injection cap for 15 seconds with a disinfecting wipe. 4. Unclamp the catheter. 5. Attach the prefilled syringe to the injection cap. 6. Flush the catheter by pushing the plunger forward until all of the liquid from the syringe is in the catheter. 7. Remove the syringe from the injection cap. 8. Clamp the catheter. Problems and solutions  If blood cannot be completely cleared from the injection cap, you may need to have the injection cap replaced.  If the catheter is difficult to flush, use the pulsing method. The pulsing method involves pushing only a few milliliters of solution into the catheter at a time and pausing between pushes.  If you do not see blood in the catheter when you pull back on the syringe, change your body position, such as by raising your arms above your head. Take a deep breath and cough. Then, pull back on the syringe. If you still do not see blood, flush the catheter with a small amount of solution. Then, change positions again and take a breath or cough. Pull back on the syringe again. If you still do not see   blood, finish flushing the catheter and contact your health care provider. Do not use your catheter until your health care provider says it is okay. General tips  Have someone help you flush your catheter, if possible.  Do not force fluid through your catheter.  Do not use a syringe that is larger or smaller than 10 mL. Using a smaller syringe can make the catheter burst.  Do not use your catheter without flushing it first if it has heparin in it. Contact a health  care provider if:  You cannot see any blood in the catheter when you flush it before using it.  Your catheter is difficult to flush. Get help right away if:  You cannot flush the catheter.  The catheter leaks when you flush it or when there is fluid in it.  There are cracks or breaks in the catheter. Summary  It is important to flush your tunneled central venous catheter each time you use it, both before and after you use it.  Scrub the injection cap for 15 seconds with a disinfecting wipe before and after you flush it.  When you flush your catheter, make sure you follow any specific instructions from your health care provider or the manufacturer.  Get help right away if you cannot flush the catheter. This information is not intended to replace advice given to you by your health care provider. Make sure you discuss any questions you have with your health care provider. Document Released: 02/07/2011 Document Revised: 05/06/2018 Document Reviewed: 05/06/2018 Elsevier Patient Education  2020 [employer]  

## 2018-11-09 ENCOUNTER — Encounter: Payer: Self-pay | Admitting: Family Medicine

## 2018-11-09 DIAGNOSIS — E039 Hypothyroidism, unspecified: Secondary | ICD-10-CM

## 2018-11-10 ENCOUNTER — Telehealth: Payer: Self-pay | Admitting: Hematology

## 2018-11-10 ENCOUNTER — Encounter: Payer: Self-pay | Admitting: Hematology

## 2018-11-10 MED ORDER — LEVOTHYROXINE SODIUM 125 MCG PO TABS: 125.0000 ug | ORAL_TABLET | Freq: Every day | ORAL | 0 refills | Status: DC

## 2018-11-10 NOTE — Telephone Encounter (Signed)
Called and spoke with patient regarding appointment for PET scan and instructions

## 2018-12-01 ENCOUNTER — Ambulatory Visit (HOSPITAL_COMMUNITY)
Admission: RE | Admit: 2018-12-01 | Discharge: 2018-12-01 | Disposition: A | Discharge status: Home / Self Care | Payer: Medicare Other | Source: Ambulatory Visit | Attending: Hematology | Admitting: Hematology

## 2018-12-01 ENCOUNTER — Other Ambulatory Visit: Payer: Self-pay

## 2018-12-01 ENCOUNTER — Encounter: Payer: Self-pay | Admitting: Hematology

## 2018-12-01 ENCOUNTER — Encounter: Payer: Self-pay | Admitting: *Deleted

## 2018-12-01 DIAGNOSIS — C8580 Other specified types of non-Hodgkin lymphoma, unspecified site: Secondary | ICD-10-CM | POA: Insufficient documentation

## 2018-12-01 DIAGNOSIS — I517 Cardiomegaly: Secondary | ICD-10-CM | POA: Insufficient documentation

## 2018-12-01 DIAGNOSIS — Z79899 Other long term (current) drug therapy: Secondary | ICD-10-CM | POA: Insufficient documentation

## 2018-12-01 DIAGNOSIS — C884 Extranodal marginal zone B-cell lymphoma of mucosa-associated lymphoid tissue [MALT-lymphoma]: Secondary | ICD-10-CM | POA: Diagnosis not present

## 2018-12-01 LAB — GLUCOSE, CAPILLARY: Glucose-Capillary: 98 mg/dL (ref 70–99)

## 2018-12-01 MED ORDER — FLUDEOXYGLUCOSE F - 18 (FDG) INJECTION: 6.9000 | Freq: Once | INTRAVENOUS | Status: DC | PRN

## 2018-12-02 ENCOUNTER — Other Ambulatory Visit: Payer: Self-pay | Admitting: *Deleted

## 2018-12-02 MED ORDER — ALLOPURINOL 300 MG PO TABS: 300.0000 mg | ORAL_TABLET | Freq: Every day | ORAL | 2 refills | Status: DC

## 2018-12-03 HISTORY — PX: ORIF ANKLE FRACTURE: SUR919

## 2018-12-08 ENCOUNTER — Inpatient Hospital Stay: Payer: Medicare Other | Attending: Hematology | Admitting: Hematology

## 2018-12-08 ENCOUNTER — Encounter: Payer: Self-pay | Admitting: *Deleted

## 2018-12-08 ENCOUNTER — Inpatient Hospital Stay: Payer: Medicare Other

## 2018-12-08 ENCOUNTER — Encounter: Payer: Self-pay | Admitting: Hematology

## 2018-12-08 ENCOUNTER — Other Ambulatory Visit: Payer: Self-pay

## 2018-12-08 ENCOUNTER — Telehealth: Payer: Self-pay | Admitting: Hematology

## 2018-12-08 VITALS — BP 191/81 | HR 63 | Temp 97.5°F | Resp 18 | Ht 61.0 in | Wt 141.0 lb

## 2018-12-08 DIAGNOSIS — Z888 Allergy status to other drugs, medicaments and biological substances status: Secondary | ICD-10-CM | POA: Insufficient documentation

## 2018-12-08 DIAGNOSIS — D649 Anemia, unspecified: Secondary | ICD-10-CM

## 2018-12-08 DIAGNOSIS — I700 Atherosclerosis of aorta: Secondary | ICD-10-CM | POA: Diagnosis not present

## 2018-12-08 DIAGNOSIS — R5383 Other fatigue: Secondary | ICD-10-CM | POA: Insufficient documentation

## 2018-12-08 DIAGNOSIS — N179 Acute kidney failure, unspecified: Secondary | ICD-10-CM | POA: Insufficient documentation

## 2018-12-08 DIAGNOSIS — C8590 Non-Hodgkin lymphoma, unspecified, unspecified site: Secondary | ICD-10-CM | POA: Diagnosis not present

## 2018-12-08 DIAGNOSIS — Z79899 Other long term (current) drug therapy: Secondary | ICD-10-CM | POA: Diagnosis not present

## 2018-12-08 DIAGNOSIS — J984 Other disorders of lung: Secondary | ICD-10-CM | POA: Diagnosis not present

## 2018-12-08 DIAGNOSIS — Z881 Allergy status to other antibiotic agents status: Secondary | ICD-10-CM | POA: Diagnosis not present

## 2018-12-08 DIAGNOSIS — C8580 Other specified types of non-Hodgkin lymphoma, unspecified site: Secondary | ICD-10-CM

## 2018-12-08 DIAGNOSIS — I517 Cardiomegaly: Secondary | ICD-10-CM | POA: Insufficient documentation

## 2018-12-08 DIAGNOSIS — Z95828 Presence of other vascular implants and grafts: Secondary | ICD-10-CM

## 2018-12-08 DIAGNOSIS — C8304 Small cell B-cell lymphoma, lymph nodes of axilla and upper limb: Secondary | ICD-10-CM | POA: Diagnosis present

## 2018-12-08 LAB — CMP (CANCER CENTER ONLY)
ALT: 12 U/L (ref 0–44)
AST: 14 U/L — ABNORMAL LOW (ref 15–41)
Albumin: 4.1 g/dL (ref 3.5–5.0)
Alkaline Phosphatase: 68 U/L (ref 38–126)
Anion gap: 8 (ref 5–15)
BUN: 18 mg/dL (ref 8–23)
CO2: 29 mmol/L (ref 22–32)
Calcium: 9.3 mg/dL (ref 8.9–10.3)
Chloride: 103 mmol/L (ref 98–111)
Creatinine: 0.89 mg/dL (ref 0.44–1.00)
GFR, Est AFR Am: >60 mL/min (ref >60)
GFR, Estimated: >60 mL/min (ref >60)
Glucose, Bld: 99 mg/dL (ref 70–99)
Potassium: 4 mmol/L (ref 3.5–5.1)
Sodium: 140 mmol/L (ref 135–145)
Total Bilirubin: 0.3 mg/dL (ref 0.3–1.2)
Total Protein: 6 g/dL — ABNORMAL LOW (ref 6.5–8.1)

## 2018-12-08 LAB — CBC WITH DIFFERENTIAL (CANCER CENTER ONLY)
Abs Immature Granulocytes: 0.01 10*3/uL (ref 0.00–0.07)
Basophils Absolute: 0.1 10*3/uL (ref 0.0–0.1)
Basophils Relative: 2 %
Eosinophils Absolute: 0.2 10*3/uL (ref 0.0–0.5)
Eosinophils Relative: 6 %
HCT: 34.3 % — ABNORMAL LOW (ref 36.0–46.0)
Hemoglobin: 11 g/dL — ABNORMAL LOW (ref 12.0–15.0)
Immature Granulocytes: 0 %
Lymphocytes Relative: 20 %
Lymphs Abs: 0.8 10*3/uL (ref 0.7–4.0)
MCH: 27.4 pg (ref 26.0–34.0)
MCHC: 32.1 g/dL (ref 30.0–36.0)
MCV: 85.3 fL (ref 80.0–100.0)
Monocytes Absolute: 1 10*3/uL (ref 0.1–1.0)
Monocytes Relative: 25 %
Neutro Abs: 1.9 10*3/uL (ref 1.7–7.7)
Neutrophils Relative %: 47 %
Platelet Count: 252 10*3/uL (ref 150–400)
RBC: 4.02 MIL/uL (ref 3.87–5.11)
RDW: 13.1 % (ref 11.5–15.5)
WBC Count: 4 10*3/uL (ref 4.0–10.5)
nRBC: 0 % (ref 0.0–0.2)

## 2018-12-08 LAB — MAGNESIUM: Magnesium: 2.1 mg/dL (ref 1.7–2.4)

## 2018-12-08 MED ORDER — HEPARIN SOD (PORK) LOCK FLUSH 100 UNIT/ML IV SOLN
500.0000 [IU] | Freq: Once | INTRAVENOUS | Status: AC
  Administered 2018-12-08: 500 [IU] via INTRAVENOUS
  Filled 2018-12-08: qty 5

## 2018-12-08 MED ORDER — SODIUM CHLORIDE 0.9% FLUSH
10.0000 mL | INTRAVENOUS | Status: DC | PRN
  Administered 2018-12-08: 10 mL via INTRAVENOUS
  Filled 2018-12-08: qty 10

## 2018-12-08 NOTE — Progress Notes (Signed)
ON PATHWAY REGIMEN - Lymphoma and CLL  No Change  Continue With Treatment as Ordered.     A cycle is every 21 days:     Prednisone      Rituximab-xxxx      Cyclophosphamide      Doxorubicin      Vincristine   **Always confirm dose/schedule in your pharmacy ordering system**  Patient Characteristics: Diffuse Large B-Cell Lymphoma or Follicular Lymphoma, Grade 3B, First Line, Stage III and IV Disease Type: Not Applicable Disease Type: Diffuse Large B-Cell Lymphoma Disease Type: Not Applicable Line of therapy: First Line [place] Stage: IV Intent of Therapy: Non-Curative / Palliative Intent, Discussed with Patient

## 2018-12-08 NOTE — Progress Notes (Signed)
DISCONTINUE ON PATHWAY REGIMEN - Lymphoma and CLL     A cycle is every 21 days:     Prednisone      Rituximab-xxxx      Cyclophosphamide      Doxorubicin      Vincristine   **Always confirm dose/schedule in your pharmacy ordering system**  REASON: Other Reason PRIOR TREATMENT: [id number]: R(IV)-CHOP q21 Days x 6 Cycles TREATMENT RESPONSE: Complete Response (CR)  START OFF PATHWAY REGIMEN - Lymphoma and CLL   [id number]:Rituximab 375 mg/m2 q2 Months (Maintenance):   A cycle is every 2 months:     Rituximab-xxxx   **Always confirm dose/schedule in your pharmacy ordering system**  Patient Characteristics: Disease Type: Not Applicable Disease Type: Not Applicable Disease Type: Not Applicable Intent of Therapy: Non-Curative / Palliative Intent, Discussed with Patient

## 2018-12-08 NOTE — Progress Notes (Signed)
[facility] OFFICE PROGRESS NOTE  Patient Care Team: Carollee Herter, Alferd Apa, DO as PCP - General (Family Medicine) Jari Pigg, MD as Consulting Physician (Dermatology) Monna Fam, MD as Consulting Physician (Ophthalmology) Terrance Mass, MD (Inactive) as Consulting Physician (Gynecology) Cordelia Poche, RN as Oncology Nurse Navigator Tish Men, MD as Medical Oncologist (Hematology)  HEME/ONC OVERVIEW: 1. 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 CT chest showed pathologic lymphadenopathy involving bilateral axilla, mediastinum as well as multiple small lung nodules and possible splenomegaly  ? PET showed FDG-avid lymphadenopathy in neck, chest, abdomen and pelvis, questionable involvement of the lung, and focal involvement of left scapula and acetabulum  ? Left axillary LN excisional bx: NHL, favoring low-grade marginal zone lymphoma; Cyclin-D1 neg ([facility]) -06/2018: admitted for AKI secondary to hypercalcemia (Ca 13); LVEF normal; second opinion at [facility] recommended mini R-CHOP for suspected DLBCL transformation -07/2018 - 11/2018: mini-R-CHOP with Onpro  Interim PET (after 3 cycles) showed resolution of nearly all FDG avidity within previously involved LN's (Deauville criteria 2 and 3); no residual or new disease   EOT PET (after 6 cycles) showed CR (Deauville 2 and 3); no residual disease  -12/2018 - present: q767month maintenance rituximab    TREATMENT REGIMEN:  07/21/2018 - 11/03/2018: mini-R-CHOP with Onpro x 6 cycles, CR  12/22/2018 - present: q379monthmaintenance rituximab, plan for 2 years (ie 8 cycles)   ASSESSMENT & PLAN:    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   Deauville 3 noted for an AP window LN, but still considered CR.  -I discussed the imaging results in detail with the patient -In light of the PET results, this is consistent with CR -I reviewed the NCCN guideline in detail with the patient, and also discussed the case in detail with Dr. [facility] for a second opinion prior to starting treatment -We discussed the role of maintenance rituximab in the setting of suspected high-grade lymphoma transformation with a background of marginal zone lymphoma, including PFS benefit but no OS benefit -After lengthy discussion, patient expressed the desire to move forward with maintenance rituxmab -We will tentatively start the treatment on 12/24/2018.  The plan is for q3m68monthituximab x 2 years  -In the interim, we will monitor her labs q3-67mo28monthth plan for CT CAP no more than q67mon30month years to monitor for any recurrent disease, given the background of marginal zone lymphoma   Normocytic anemia -Multifactorial, including recent chemotherapy -Hgb 11.0 today, improving -Patient denies any symptoms of bleeding  -We will monitor it for now    No orders of the defined types were placed in this encounter.  All questions were answered. The patient knows to call the clinic with any problems, questions or concerns. No barriers to learning was detected.  A total of more than 25 minutes were spent face-to-face with the patient during this encounter and over half of that time was spent on counseling and coordination of care as outlined above.   Return in 2 weeks for 1st dose of maintenance rituximab. Return in 3 months for labs, clinic appt and 2nd dose of maintenance rituximab.   Thamara Leger ZTish Men10/08/2018 12:34 PM  CHIEF COMPLAINT: "  I am fine"  INTERVAL HISTORY: Ms. Mcmullan returns to clinic for follow-up of marginal zone lymphoma 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 transformation s/p 6 cycles of mini-R-CHOP.  Patient reports that she  still has some fatigue after completing chemotherapy, but overall it is improving.  She is otherwise feeling well, and denies any constitutional symptoms, chest pain, dyspnea, abdominal pain, nausea, vomiting, or diarrhea.    REVIEW OF SYSTEMS:   Constitutional: ( - ) fevers, ( - )  chills , ( - ) night sweats Eyes: ( - ) blurriness of vision, ( - ) double vision, ( - ) watery eyes Ears, nose, mouth, throat, and face: ( - ) mucositis, ( - ) sore throat Respiratory: ( - ) cough, ( - ) dyspnea, ( - ) wheezes Cardiovascular: ( - ) palpitation, ( - ) chest discomfort, ( - ) lower extremity swelling Gastrointestinal:  ( - ) nausea, ( - ) heartburn, ( - ) change in bowel habits Skin: ( - ) abnormal skin rashes Lymphatics: ( - ) new lymphadenopathy, ( - ) easy bruising Neurological: ( - ) numbness, ( - ) tingling, ( - ) new weaknesses Behavioral/Psych: ( - ) mood change, ( - ) new changes  All other systems were reviewed with the patient and are negative.  SUMMARY OF ONCOLOGIC HISTORY: Oncology History  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[place])  05/21/2018 Initial Diagnosis   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[place])   07/21/2018 - 11/23/2018 Chemotherapy   The patient had DOXOrubicin (ADRIAMYCIN) chemo injection 42 mg, 25 mg/m2 = 42 mg (100 % of original dose 25 mg/m2), Intravenous,  Once, 6 of 6 cycles Dose modification: 25 mg/m2 (original dose 25 mg/m2, Cycle 1, Reason: Patient Age) Administration: 42 mg (07/21/2018), 42 mg (08/11/2018), 42 mg (09/01/2018), 42 mg (09/22/2018), 42 mg (10/13/2018), 42 mg (11/03/2018) palonosetron (ALOXI) injection 0.25 mg, 0.25 mg, Intravenous,  Once, 6 of 6 cycles Administration: 0.25 mg (07/21/2018), 0.25 mg (08/11/2018), 0.25 mg (09/01/2018), 0.25 mg (09/22/2018), 0.25 mg (10/13/2018), 0.25 mg (11/03/2018) pegfilgrastim (NEULASTA ONPRO KIT) injection 6 mg, 6 mg, Subcutaneous, Once, 6 of 6 cycles Administration: 6 mg (07/21/2018), 6 mg (08/11/2018), 6 mg (09/01/2018), 6 mg (09/22/2018), 6 mg (10/13/2018), 6  mg (11/03/2018) vinCRIStine (ONCOVIN) 1 mg in sodium chloride 0.9 % 50 mL chemo infusion, 1 mg (100 % of original dose 1 mg), Intravenous,  Once, 6 of 6 cycles Dose modification: 1 mg (original dose 1 mg, Cycle 1, Reason: Patient Age) Administration: 1 mg (07/21/2018), 1 mg (08/11/2018), 1 mg (09/01/2018), 1 mg (09/22/2018), 1 mg (10/13/2018), 1 mg (11/03/2018) riTUXimab (RITUXAN) 600 mg in sodium chloride 0.9 % 250 mL (1.9355 mg/mL) infusion, 375 mg/m2 = 600 mg, Intravenous,  Once, 1 of 1 cycle Administration: 600 mg (07/21/2018) cyclophosphamide (CYTOXAN) 660 mg in sodium chloride 0.9 % 250 mL chemo infusion, 400 mg/m2 = 660 mg (100 % of original dose 400 mg/m2), Intravenous,  Once, 6 of 6 cycles Dose modification: 400 mg/m2 (original dose 400 mg/m2, Cycle 1, Reason: Patient Age) Administration: 660 mg (07/21/2018), 660 mg (08/11/2018), 660 mg (09/01/2018), 660 mg (09/22/2018), 660 mg (10/13/2018), 660 mg (11/03/2018) riTUXimab (RITUXAN) 600 mg in sodium chloride 0.9 % 190 mL infusion, 375 mg/m2 = 600 mg (100 % of original dose 375 mg/m2), Intravenous,  Once, 5 of 5 cycles Dose modification: 375 mg/m2 (original dose 375 mg/m2, Cycle 2) Administration: 600 mg (08/11/2018), 600 mg (09/01/2018), 600 mg (09/22/2018), 600 mg (10/13/2018), 600 mg (11/03/2018)  for chemotherapy treatment.    12/22/2018 -  Chemotherapy   The patient had riTUXimab-pvvr (RUXIENCE) 600 mg in sodium chloride 0.9 % 250 mL (1.9355 mg/mL) infusion, 375 mg/m2 = 600 mg (100 % of original dose 375 mg/m2), Intravenous,  Once, 0 of 8 cycles Dose modification: 375 mg/m2 (original dose 375 mg/m2, Cycle 1, Reason: Other (see comments))  for chemotherapy treatment.      I have reviewed the past medical history, past surgical history, social history and family history with the patient and they are unchanged from previous note.  ALLERGIES:  is allergic to phenergan [promethazine hcl]; levofloxacin; and pravastatin sodium.  MEDICATIONS:  Current Outpatient  Medications  Medication Sig Dispense Refill  . acetaminophen (TYLENOL) 500 MG tablet Take 1,000 mg by mouth every 6 (six) hours as needed for mild pain.    [name] allopurinol (ZYLOPRIM) 300 MG tablet Take 1 tablet (300 mg total) by mouth daily. 30 tablet 2  . amLODipine (NORVASC) 5 MG tablet Take 1 tablet (5 mg total) by mouth daily with breakfast. 90 tablet 1  . buPROPion (WELLBUTRIN XL) 150 MG 24 hr tablet TAKE 1 TABLET BY MOUTH EVERY DAY 90 tablet 1  . famotidine (PEPCID) 20 MG tablet One at bedtime (Patient taking differently: Take 20 mg by mouth at bedtime. One at bedtime ) 30 tablet 11  . levothyroxine (SYNTHROID) 125 MCG tablet Take 1 tablet (125 mcg total) by mouth daily. 90 tablet 0  . omeprazole (PRILOSEC) 20 MG capsule Take 2 x 30 min before breakfast (Patient taking differently: Take 20 mg by mouth daily. )    . traZODone (DESYREL) 50 MG tablet Take 1 tablet (50 mg total) by mouth at bedtime as needed for sleep. 30 tablet 1  . albuterol (PROVENTIL) (2.5 MG/3ML) 0.083% nebulizer solution Take 3 mLs (2.5 mg total) by nebulization every 6 (six) hours as needed for wheezing or shortness of breath. (Patient not taking: Reported on 12/08/2018) 75 mL 12  . feeding supplement, ENSURE ENLIVE, (ENSURE ENLIVE) LIQD Take 237 mLs by mouth 2 (two) times daily between meals. (Patient not taking: Reported on 12/08/2018) 60 Bottle 0  . lidocaine (XYLOCAINE) 2 % solution     . lidocaine-prilocaine (EMLA) cream Apply to affected area once (Patient not taking: Reported on 12/08/2018) 30 g 3  . magic mouthwash w/lidocaine SOLN Take 5 mLs by mouth 4 (four) times daily as needed for mouth pain. Swish and Spit (Patient not taking: Reported on 12/08/2018) 240 mL 0  . ondansetron (ZOFRAN) 8 MG tablet Take 1 tablet (8 mg total) by mouth 2 (two) times daily as needed for refractory nausea / vomiting. Start on day 3 after cyclophosphamide chemotherapy. (Patient not taking: Reported on 12/08/2018) 30 tablet 1  . predniSONE  (DELTASONE) 20 MG tablet TK 3 AND 1/2 TS PO D FOR 5 DAYS. TK ON DAYS 1 THROUGH 5 OF CHEMOTHERAPY    . prochlorperazine (COMPAZINE) 10 MG tablet Take 1 tablet (10 mg total) by mouth every 6 (six) hours as needed (Nausea or vomiting). (Patient not taking: Reported on 12/08/2018) 30 tablet 6   No current facility-administered medications for this visit.     PHYSICAL EXAMINATION: ECOG PERFORMANCE STATUS: 1 - Symptomatic but completely ambulatory  Today's Vitals   12/08/18 1228  BP: (!) 191/81  Pulse: 63  Resp: 18  Temp: (!) 97.5 F (36.4 C)  TempSrc: Temporal  SpO2: 100%  Weight: 141 lb (64 kg)  Height: '5\' 1"'  (1.549 m)  PainSc: 0-No pain   Body mass index is 26.64 kg/m.  Filed Weights   12/08/18 1228  Weight: 141 lb (64 kg)    GENERAL: alert, no distress and comfortable SKIN: skin color, texture, turgor are normal, no rashes or significant lesions EYES: conjunctiva are pink and non-injected, sclera clear OROPHARYNX: no exudate, no erythema; lips, buccal mucosa, and tongue normal  NECK: supple, non-tender LYMPH:  no palpable lymphadenopathy in the cervical LUNGS: clear to auscultation with normal breathing effort HEART: regular rate & rhythm and no murmurs and no lower extremity edema ABDOMEN: soft, non-tender, non-distended, normal bowel sounds Musculoskeletal: no cyanosis of digits and no clubbing  PSYCH: alert & oriented x 3, fluent speech NEURO: no focal motor/sensory deficits  LABORATORY DATA:  I have reviewed the data as listed    Component Value Date/Time   NA 140 12/08/2018 1140   NA 129 (A) 06/23/2018   K 4.0 12/08/2018 1140   CL 103 12/08/2018 1140   CO2 29 12/08/2018 1140   GLUCOSE 99 12/08/2018 1140   BUN 18 12/08/2018 1140   BUN 14 06/23/2018   CREATININE 0.89 12/08/2018 1140   CREATININE 1.36 (H) 10/27/2015 1530   CALCIUM 9.3 12/08/2018 1140   CALCIUM 12.3 (H) 06/16/2018 1639   PROT 6.0 (L) 12/08/2018 1140   ALBUMIN 4.1 12/08/2018 1140   AST 14 (L)  12/08/2018 1140   ALT 12 12/08/2018 1140   ALKPHOS 68 12/08/2018 1140   BILITOT 0.3 12/08/2018 1140   GFRNONAA >60 12/08/2018 1140   GFRAA >60 12/08/2018 1140    No results found for: SPEP, UPEP  Lab Results  Component Value Date   WBC 4.0 12/08/2018   NEUTROABS 1.9 12/08/2018   HGB 11.0 (L) 12/08/2018   HCT 34.3 (L) 12/08/2018   MCV 85.3 12/08/2018   PLT 252 12/08/2018      Chemistry      Component Value Date/Time   NA 140 12/08/2018 1140   NA 129 (A) 06/23/2018   K 4.0 12/08/2018 1140   CL 103 12/08/2018 1140   CO2 29 12/08/2018 1140   BUN 18 12/08/2018 1140   BUN 14 06/23/2018   CREATININE 0.89 12/08/2018 1140   CREATININE 1.36 (H) 10/27/2015 1530   GLU 91 06/23/2018      Component Value Date/Time   CALCIUM 9.3 12/08/2018 1140   CALCIUM 12.3 (H) 06/16/2018 1639   ALKPHOS 68 12/08/2018 1140   AST 14 (L) 12/08/2018 1140   ALT 12 12/08/2018 1140   BILITOT 0.3 12/08/2018 1140       RADIOGRAPHIC STUDIES: I have personally reviewed the radiological images as listed below and agreed with the findings in the report. Nm Pet Image Restag (ps) Skull Base To Thigh  Result Date: 12/01/2018 CLINICAL DATA:  Subsequent treatment strategy for marginal zone lymphoma. EXAM: NUCLEAR MEDICINE PET SKULL BASE TO THIGH TECHNIQUE: 6.9 mCi F-18 FDG was injected intravenously. Full-ring PET imaging was performed from the skull base to thigh after the radiotracer. CT data was obtained and used for attenuation correction and anatomic localization. Fasting blood glucose: 98 mg/dl COMPARISON:  Multiple exams, including 09/16/2018 FINDINGS: Mediastinal blood pool activity: SUV max 2.4 Liver activity: SUV max 3.4 NECK: Physiologic muscular activity noted in the neck. No pathologically enlarged or hypermetabolic adenopathy in the neck. Incidental CT findings: Poorly seen thyroid gland, surgically absent or severely atrophic. CHEST: No significant residual pathologically enlarged adenopathy in the  chest. AP window lymph node measuring 0.7 cm in short axis on image 56/4 (previously 0.8 cm), maximum SUV 2.9 which is (previously 2.5),  Deauville 3. Index left axillary node measuring 0.5 cm in short axis on image 61/4 (formerly 0.7 cm), maximum SUV 1.1 (formerly 1.4), Deauville 2. No newly enlarged or worrisome lymph nodes in the chest are identified. Incidental CT findings: Cardiomegaly. Atherosclerotic calcification of the aortic arch and branch vessels. Stable biapical pleuroparenchymal scarring. Stable interstitial accentuation at the lung bases. Stable scarring medially in the right middle lobe. Previously noted indistinct anteromedial left upper lobe lung nodule is no longer present. ABDOMEN/PELVIS: No current enlarged abdominal lymph nodes. An index right external iliac node measuring 0.5 cm in short axis (previously the same) has a maximum SUV of 1.4 (previously 1.4). This is considered Deauville 2. No splenomegaly or abnormal splenic activity. Incidental CT findings: Aortoiliac atherosclerotic vascular disease. Prominent stool throughout the colon favors constipation. SKELETON: No significant abnormal hypermetabolic activity in this region. Incidental CT findings: none IMPRESSION: 1. No current pathologically enlarged adenopathy. There is a Deauville 3 AP window lymph node in the chest. Other small axillary lymph nodes are Deauville 2 in the chest. Stable definite 2 right external iliac lymph node, only 0.5 cm in short axis diameter. No progressive adenopathy. 2. Other imaging findings of potential clinical significance: Cardiomegaly. Aortic Atherosclerosis (ICD10-I70.0). Mild interstitial accentuation at the lung bases, stable. Prominent stool throughout the colon favors constipation. Electronically Signed   By: Van Clines M.D.   On: 12/01/2018 11:30

## 2018-12-08 NOTE — Telephone Encounter (Signed)
Called LMVM w/ updated appointments / letter/calendar mailed per 10/6 los °

## 2018-12-08 NOTE — Patient Instructions (Signed)

## 2018-12-10 ENCOUNTER — Encounter: Payer: Self-pay | Admitting: Family Medicine

## 2018-12-10 ENCOUNTER — Other Ambulatory Visit: Payer: Self-pay | Admitting: Family Medicine

## 2018-12-10 DIAGNOSIS — K219 Gastro-esophageal reflux disease without esophagitis: Secondary | ICD-10-CM

## 2018-12-10 NOTE — Telephone Encounter (Signed)
i'm really not sure how to look at the pet scans--- -  Dr Maylon Peppers is the specialist for that----- it looks like the radiologist said there was nothing concerning-- things were stable but he would really be the best one to explain that to her

## 2018-12-12 ENCOUNTER — Emergency Department (HOSPITAL_COMMUNITY): Payer: Medicare Other

## 2018-12-12 ENCOUNTER — Emergency Department (HOSPITAL_COMMUNITY): Payer: Medicare Other | Admitting: Certified Registered"

## 2018-12-12 ENCOUNTER — Inpatient Hospital Stay (HOSPITAL_COMMUNITY)
Admission: EM | Admit: 2018-12-12 | Discharge: 2018-12-16 | DRG: 464 | Disposition: A | Discharge status: Rehabilitation Facility | Payer: Medicare Other | Attending: Orthopedic Surgery | Admitting: Orthopedic Surgery

## 2018-12-12 ENCOUNTER — Other Ambulatory Visit: Payer: Self-pay

## 2018-12-12 ENCOUNTER — Encounter (HOSPITAL_COMMUNITY): Payer: Self-pay | Admitting: Certified Registered"

## 2018-12-12 ENCOUNTER — Inpatient Hospital Stay (HOSPITAL_COMMUNITY): Payer: Medicare Other

## 2018-12-12 ENCOUNTER — Encounter (HOSPITAL_COMMUNITY): Admission: EM | Disposition: A | Discharge status: Rehabilitation Facility | Payer: Self-pay | Source: Home / Self Care

## 2018-12-12 DIAGNOSIS — S81012A Laceration without foreign body, left knee, initial encounter: Secondary | ICD-10-CM | POA: Diagnosis not present

## 2018-12-12 DIAGNOSIS — S32049A Unspecified fracture of fourth lumbar vertebra, initial encounter for closed fracture: Secondary | ICD-10-CM | POA: Diagnosis present

## 2018-12-12 DIAGNOSIS — Y92410 Unspecified street and highway as the place of occurrence of the external cause: Secondary | ICD-10-CM | POA: Diagnosis not present

## 2018-12-12 DIAGNOSIS — D696 Thrombocytopenia, unspecified: Secondary | ICD-10-CM | POA: Diagnosis present

## 2018-12-12 DIAGNOSIS — Z23 Encounter for immunization: Secondary | ICD-10-CM | POA: Diagnosis not present

## 2018-12-12 DIAGNOSIS — C8590 Non-Hodgkin lymphoma, unspecified, unspecified site: Secondary | ICD-10-CM | POA: Diagnosis not present

## 2018-12-12 DIAGNOSIS — K5903 Drug induced constipation: Secondary | ICD-10-CM | POA: Diagnosis not present

## 2018-12-12 DIAGNOSIS — S2222XA Fracture of body of sternum, initial encounter for closed fracture: Secondary | ICD-10-CM | POA: Diagnosis not present

## 2018-12-12 DIAGNOSIS — Z9221 Personal history of antineoplastic chemotherapy: Secondary | ICD-10-CM

## 2018-12-12 DIAGNOSIS — Z9981 Dependence on supplemental oxygen: Secondary | ICD-10-CM | POA: Diagnosis not present

## 2018-12-12 DIAGNOSIS — S82892B Other fracture of left lower leg, initial encounter for open fracture type I or II: Secondary | ICD-10-CM | POA: Diagnosis present

## 2018-12-12 DIAGNOSIS — G8918 Other acute postprocedural pain: Secondary | ICD-10-CM

## 2018-12-12 DIAGNOSIS — K219 Gastro-esophageal reflux disease without esophagitis: Secondary | ICD-10-CM | POA: Diagnosis present

## 2018-12-12 DIAGNOSIS — R079 Chest pain, unspecified: Secondary | ICD-10-CM | POA: Diagnosis not present

## 2018-12-12 DIAGNOSIS — Z833 Family history of diabetes mellitus: Secondary | ICD-10-CM

## 2018-12-12 DIAGNOSIS — S82852D Displaced trimalleolar fracture of left lower leg, subsequent encounter for closed fracture with routine healing: Secondary | ICD-10-CM | POA: Diagnosis not present

## 2018-12-12 DIAGNOSIS — T07XXXA Unspecified multiple injuries, initial encounter: Secondary | ICD-10-CM | POA: Diagnosis not present

## 2018-12-12 DIAGNOSIS — S199XXA Unspecified injury of neck, initial encounter: Secondary | ICD-10-CM | POA: Diagnosis not present

## 2018-12-12 DIAGNOSIS — Z803 Family history of malignant neoplasm of breast: Secondary | ICD-10-CM | POA: Diagnosis not present

## 2018-12-12 DIAGNOSIS — D62 Acute posthemorrhagic anemia: Secondary | ICD-10-CM

## 2018-12-12 DIAGNOSIS — M7989 Other specified soft tissue disorders: Secondary | ICD-10-CM | POA: Diagnosis not present

## 2018-12-12 DIAGNOSIS — Z825 Family history of asthma and other chronic lower respiratory diseases: Secondary | ICD-10-CM

## 2018-12-12 DIAGNOSIS — Z8042 Family history of malignant neoplasm of prostate: Secondary | ICD-10-CM

## 2018-12-12 DIAGNOSIS — S32049D Unspecified fracture of fourth lumbar vertebra, subsequent encounter for fracture with routine healing: Secondary | ICD-10-CM | POA: Diagnosis not present

## 2018-12-12 DIAGNOSIS — S301XXA Contusion of abdominal wall, initial encounter: Secondary | ICD-10-CM | POA: Diagnosis not present

## 2018-12-12 DIAGNOSIS — S82842B Displaced bimalleolar fracture of left lower leg, initial encounter for open fracture type I or II: Secondary | ICD-10-CM | POA: Diagnosis not present

## 2018-12-12 DIAGNOSIS — S32019A Unspecified fracture of first lumbar vertebra, initial encounter for closed fracture: Secondary | ICD-10-CM | POA: Diagnosis present

## 2018-12-12 DIAGNOSIS — E039 Hypothyroidism, unspecified: Secondary | ICD-10-CM

## 2018-12-12 DIAGNOSIS — S8292XD Unspecified fracture of left lower leg, subsequent encounter for closed fracture with routine healing: Secondary | ICD-10-CM | POA: Diagnosis not present

## 2018-12-12 DIAGNOSIS — S32029A Unspecified fracture of second lumbar vertebra, initial encounter for closed fracture: Secondary | ICD-10-CM | POA: Diagnosis not present

## 2018-12-12 DIAGNOSIS — S299XXA Unspecified injury of thorax, initial encounter: Secondary | ICD-10-CM | POA: Diagnosis not present

## 2018-12-12 DIAGNOSIS — S82832A Other fracture of upper and lower end of left fibula, initial encounter for closed fracture: Secondary | ICD-10-CM | POA: Diagnosis not present

## 2018-12-12 DIAGNOSIS — R000 Tachycardia, unspecified: Secondary | ICD-10-CM | POA: Diagnosis not present

## 2018-12-12 DIAGNOSIS — Z419 Encounter for procedure for purposes other than remedying health state, unspecified: Secondary | ICD-10-CM

## 2018-12-12 DIAGNOSIS — S2241XA Multiple fractures of ribs, right side, initial encounter for closed fracture: Secondary | ICD-10-CM

## 2018-12-12 DIAGNOSIS — S8252XA Displaced fracture of medial malleolus of left tibia, initial encounter for closed fracture: Secondary | ICD-10-CM | POA: Diagnosis not present

## 2018-12-12 DIAGNOSIS — I10 Essential (primary) hypertension: Secondary | ICD-10-CM | POA: Diagnosis present

## 2018-12-12 DIAGNOSIS — S301XXD Contusion of abdominal wall, subsequent encounter: Secondary | ICD-10-CM | POA: Diagnosis not present

## 2018-12-12 DIAGNOSIS — Z79899 Other long term (current) drug therapy: Secondary | ICD-10-CM

## 2018-12-12 DIAGNOSIS — Z8041 Family history of malignant neoplasm of ovary: Secondary | ICD-10-CM

## 2018-12-12 DIAGNOSIS — R519 Headache, unspecified: Secondary | ICD-10-CM | POA: Diagnosis not present

## 2018-12-12 DIAGNOSIS — S2241XD Multiple fractures of ribs, right side, subsequent encounter for fracture with routine healing: Secondary | ICD-10-CM | POA: Diagnosis not present

## 2018-12-12 DIAGNOSIS — S2220XA Unspecified fracture of sternum, initial encounter for closed fracture: Secondary | ICD-10-CM | POA: Diagnosis present

## 2018-12-12 DIAGNOSIS — M542 Cervicalgia: Secondary | ICD-10-CM | POA: Diagnosis not present

## 2018-12-12 DIAGNOSIS — R350 Frequency of micturition: Secondary | ICD-10-CM | POA: Diagnosis not present

## 2018-12-12 DIAGNOSIS — S32010A Wedge compression fracture of first lumbar vertebra, initial encounter for closed fracture: Secondary | ICD-10-CM | POA: Diagnosis not present

## 2018-12-12 DIAGNOSIS — S3993XA Unspecified injury of pelvis, initial encounter: Secondary | ICD-10-CM | POA: Diagnosis not present

## 2018-12-12 DIAGNOSIS — S82852B Displaced trimalleolar fracture of left lower leg, initial encounter for open fracture type I or II: Secondary | ICD-10-CM

## 2018-12-12 DIAGNOSIS — R0989 Other specified symptoms and signs involving the circulatory and respiratory systems: Secondary | ICD-10-CM | POA: Diagnosis not present

## 2018-12-12 DIAGNOSIS — S82852S Displaced trimalleolar fracture of left lower leg, sequela: Secondary | ICD-10-CM | POA: Diagnosis not present

## 2018-12-12 DIAGNOSIS — Z823 Family history of stroke: Secondary | ICD-10-CM | POA: Diagnosis not present

## 2018-12-12 DIAGNOSIS — S0990XA Unspecified injury of head, initial encounter: Secondary | ICD-10-CM | POA: Diagnosis not present

## 2018-12-12 DIAGNOSIS — R42 Dizziness and giddiness: Secondary | ICD-10-CM | POA: Diagnosis not present

## 2018-12-12 DIAGNOSIS — Z841 Family history of disorders of kidney and ureter: Secondary | ICD-10-CM

## 2018-12-12 DIAGNOSIS — Z8349 Family history of other endocrine, nutritional and metabolic diseases: Secondary | ICD-10-CM

## 2018-12-12 DIAGNOSIS — Z8249 Family history of ischemic heart disease and other diseases of the circulatory system: Secondary | ICD-10-CM

## 2018-12-12 DIAGNOSIS — Z20828 Contact with and (suspected) exposure to other viral communicable diseases: Secondary | ICD-10-CM | POA: Diagnosis present

## 2018-12-12 DIAGNOSIS — S82892A Other fracture of left lower leg, initial encounter for closed fracture: Secondary | ICD-10-CM

## 2018-12-12 DIAGNOSIS — S32018D Other fracture of first lumbar vertebra, subsequent encounter for fracture with routine healing: Secondary | ICD-10-CM | POA: Diagnosis not present

## 2018-12-12 DIAGNOSIS — S2220XD Unspecified fracture of sternum, subsequent encounter for fracture with routine healing: Secondary | ICD-10-CM | POA: Diagnosis not present

## 2018-12-12 DIAGNOSIS — Z79890 Hormone replacement therapy (postmenopausal): Secondary | ICD-10-CM

## 2018-12-12 DIAGNOSIS — S81012D Laceration without foreign body, left knee, subsequent encounter: Secondary | ICD-10-CM | POA: Diagnosis not present

## 2018-12-12 DIAGNOSIS — S32029D Unspecified fracture of second lumbar vertebra, subsequent encounter for fracture with routine healing: Secondary | ICD-10-CM | POA: Diagnosis not present

## 2018-12-12 HISTORY — DX: Thyrotoxicosis, unspecified without thyrotoxic crisis or storm: E05.90

## 2018-12-12 HISTORY — PX: ORIF ANKLE FRACTURE: SHX5408

## 2018-12-12 HISTORY — PX: ORIF ANKLE FRACTURE: SUR919

## 2018-12-12 HISTORY — DX: Essential (primary) hypertension: I10

## 2018-12-12 LAB — COMPREHENSIVE METABOLIC PANEL
ALT: 37 U/L (ref 0–44)
AST: 48 U/L — ABNORMAL HIGH (ref 15–41)
Albumin: 3.7 g/dL (ref 3.5–5.0)
Alkaline Phosphatase: 78 U/L (ref 38–126)
Anion gap: 12 (ref 5–15)
BUN: 21 mg/dL (ref 8–23)
CO2: 23 mmol/L (ref 22–32)
Calcium: 9.3 mg/dL (ref 8.9–10.3)
Chloride: 104 mmol/L (ref 98–111)
Creatinine, Ser: 0.99 mg/dL (ref 0.44–1.00)
GFR calc Af Amer: >60 mL/min (ref >60)
GFR calc non Af Amer: 54 mL/min — ABNORMAL LOW (ref >60)
Glucose, Bld: 124 mg/dL — ABNORMAL HIGH (ref 70–99)
Potassium: 4.3 mmol/L (ref 3.5–5.1)
Sodium: 139 mmol/L (ref 135–145)
Total Bilirubin: 0.3 mg/dL (ref 0.3–1.2)
Total Protein: 5.7 g/dL — ABNORMAL LOW (ref 6.5–8.1)

## 2018-12-12 LAB — CBC
HCT: 35.8 % — ABNORMAL LOW (ref 36.0–46.0)
Hemoglobin: 11.8 g/dL — ABNORMAL LOW (ref 12.0–15.0)
MCH: 28.2 pg (ref 26.0–34.0)
MCHC: 33 g/dL (ref 30.0–36.0)
MCV: 85.4 fL (ref 80.0–100.0)
Platelets: 227 10*3/uL (ref 150–400)
RBC: 4.19 MIL/uL (ref 3.87–5.11)
RDW: 12.9 % (ref 11.5–15.5)
WBC: 7.5 10*3/uL (ref 4.0–10.5)
nRBC: 0 % (ref 0.0–0.2)

## 2018-12-12 LAB — I-STAT CHEM 8, ED
BUN: 23 mg/dL (ref 8–23)
Calcium, Ion: 1.11 mmol/L — ABNORMAL LOW (ref 1.15–1.40)
Chloride: 104 mmol/L (ref 98–111)
Creatinine, Ser: 1 mg/dL (ref 0.44–1.00)
Glucose, Bld: 116 mg/dL — ABNORMAL HIGH (ref 70–99)
HCT: 34 % — ABNORMAL LOW (ref 36.0–46.0)
Hemoglobin: 11.6 g/dL — ABNORMAL LOW (ref 12.0–15.0)
Potassium: 4.1 mmol/L (ref 3.5–5.1)
Sodium: 139 mmol/L (ref 135–145)
TCO2: 23 mmol/L (ref 22–32)

## 2018-12-12 LAB — SAMPLE TO BLOOD BANK

## 2018-12-12 LAB — CDS SEROLOGY

## 2018-12-12 LAB — SARS CORONAVIRUS 2 BY RT PCR (HOSPITAL ORDER, PERFORMED IN ~~LOC~~ HOSPITAL LAB): SARS Coronavirus 2: NEGATIVE

## 2018-12-12 LAB — PROTIME-INR
INR: 1 (ref 0.8–1.2)
Prothrombin Time: 13.1 seconds (ref 11.4–15.2)

## 2018-12-12 LAB — LACTIC ACID, PLASMA: Lactic Acid, Venous: 1.8 mmol/L (ref 0.5–1.9)

## 2018-12-12 LAB — ETHANOL: Alcohol, Ethyl (B): <10 mg/dL (ref <10)

## 2018-12-12 SURGERY — OPEN REDUCTION INTERNAL FIXATION (ORIF) ANKLE FRACTURE
Anesthesia: General | Site: Ankle | Laterality: Left

## 2018-12-12 MED ORDER — SUFENTANIL CITRATE 50 MCG/ML IV SOLN
INTRAVENOUS | Status: AC
  Filled 2018-12-12: qty 1

## 2018-12-12 MED ORDER — OXYCODONE HCL 5 MG PO TABS: 10.0000 mg | ORAL_TABLET | ORAL | Status: DC | PRN

## 2018-12-12 MED ORDER — SUFENTANIL CITRATE 50 MCG/ML IV SOLN
INTRAVENOUS | Status: DC | PRN
  Administered 2018-12-12 (×2): 10 ug via INTRAVENOUS

## 2018-12-12 MED ORDER — SUCCINYLCHOLINE CHLORIDE 200 MG/10ML IV SOSY
PREFILLED_SYRINGE | INTRAVENOUS | Status: DC | PRN
  Administered 2018-12-12: 100 mg via INTRAVENOUS

## 2018-12-12 MED ORDER — DOCUSATE SODIUM 100 MG PO CAPS
100.0000 mg | ORAL_CAPSULE | Freq: Two times a day (BID) | ORAL | Status: DC
  Administered 2018-12-13 – 2018-12-16 (×7): 100 mg via ORAL
  Filled 2018-12-12 (×7): qty 1

## 2018-12-12 MED ORDER — ALBUMIN HUMAN 5 % IV SOLN
INTRAVENOUS | Status: DC | PRN
  Administered 2018-12-12: 21:00:00 via INTRAVENOUS

## 2018-12-12 MED ORDER — LIDOCAINE 2% (20 MG/ML) 5 ML SYRINGE
INTRAMUSCULAR | Status: AC
  Filled 2018-12-12: qty 5

## 2018-12-12 MED ORDER — DOCUSATE SODIUM 100 MG PO CAPS: 100.0000 mg | ORAL_CAPSULE | Freq: Two times a day (BID) | ORAL | Status: DC

## 2018-12-12 MED ORDER — EPHEDRINE SULFATE 50 MG/ML IJ SOLN
INTRAMUSCULAR | Status: DC | PRN
  Administered 2018-12-12 (×5): 10 mg via INTRAVENOUS

## 2018-12-12 MED ORDER — BUPIVACAINE HCL (PF) 0.25 % IJ SOLN
INTRAMUSCULAR | Status: AC
  Filled 2018-12-12: qty 30

## 2018-12-12 MED ORDER — TETANUS-DIPHTH-ACELL PERTUSSIS 5-2.5-18.5 LF-MCG/0.5 IM SUSP
0.5000 mL | Freq: Once | INTRAMUSCULAR | Status: AC
  Administered 2018-12-12: 0.5 mL via INTRAMUSCULAR
  Filled 2018-12-12: qty 0.5

## 2018-12-12 MED ORDER — BUPROPION HCL ER (XL) 150 MG PO TB24: 150.0000 mg | ORAL_TABLET | Freq: Every day | ORAL | Status: DC

## 2018-12-12 MED ORDER — STERILE WATER FOR IRRIGATION IR SOLN
Status: DC | PRN
  Administered 2018-12-12: 1000 mL

## 2018-12-12 MED ORDER — OXYCODONE HCL 5 MG PO TABS
5.0000 mg | ORAL_TABLET | ORAL | Status: DC | PRN
  Administered 2018-12-13: 10 mg via ORAL
  Administered 2018-12-13 – 2018-12-14 (×2): 5 mg via ORAL
  Administered 2018-12-14: 10 mg via ORAL
  Filled 2018-12-12 (×4): qty 2

## 2018-12-12 MED ORDER — ONDANSETRON HCL 4 MG/2ML IJ SOLN
INTRAMUSCULAR | Status: AC
  Filled 2018-12-12: qty 2

## 2018-12-12 MED ORDER — ACETAMINOPHEN 10 MG/ML IV SOLN
INTRAVENOUS | Status: DC | PRN
  Administered 2018-12-12: 1000 mg via INTRAVENOUS

## 2018-12-12 MED ORDER — IOHEXOL 300 MG/ML  SOLN
100.0000 mL | Freq: Once | INTRAMUSCULAR | Status: AC | PRN
  Administered 2018-12-12: 100 mL via INTRAVENOUS

## 2018-12-12 MED ORDER — PROPOFOL 10 MG/ML IV BOLUS
INTRAVENOUS | Status: DC | PRN
  Administered 2018-12-12: 130 mg via INTRAVENOUS

## 2018-12-12 MED ORDER — SODIUM CHLORIDE (PF) 0.9 % IJ SOLN
INTRAMUSCULAR | Status: AC
  Filled 2018-12-12: qty 10

## 2018-12-12 MED ORDER — PANTOPRAZOLE SODIUM 40 MG IV SOLR: 40.0000 mg | Freq: Every day | INTRAVENOUS | Status: DC

## 2018-12-12 MED ORDER — CEFAZOLIN SODIUM-DEXTROSE 1-4 GM/50ML-% IV SOLN
1.0000 g | Freq: Three times a day (TID) | INTRAVENOUS | Status: AC
  Administered 2018-12-13 – 2018-12-14 (×6): 1 g via INTRAVENOUS
  Filled 2018-12-12 (×6): qty 50

## 2018-12-12 MED ORDER — OXYCODONE HCL 5 MG PO TABS: 5.0000 mg | ORAL_TABLET | ORAL | Status: DC | PRN

## 2018-12-12 MED ORDER — ONDANSETRON HCL 4 MG/2ML IJ SOLN
4.0000 mg | INTRAMUSCULAR | Status: DC | PRN
  Administered 2018-12-12: 4 mg via INTRAVENOUS

## 2018-12-12 MED ORDER — MORPHINE SULFATE (PF) 4 MG/ML IV SOLN: 4.0000 mg | INTRAVENOUS | Status: DC | PRN

## 2018-12-12 MED ORDER — ONDANSETRON HCL 4 MG/2ML IJ SOLN
4.0000 mg | Freq: Four times a day (QID) | INTRAMUSCULAR | Status: DC | PRN
  Administered 2018-12-14 – 2018-12-15 (×3): 4 mg via INTRAVENOUS
  Filled 2018-12-12 (×3): qty 2

## 2018-12-12 MED ORDER — MORPHINE SULFATE (PF) 4 MG/ML IV SOLN
4.0000 mg | Freq: Once | INTRAVENOUS | Status: AC
  Administered 2018-12-12: 4 mg via INTRAVENOUS
  Filled 2018-12-12: qty 1

## 2018-12-12 MED ORDER — SUCCINYLCHOLINE CHLORIDE 200 MG/10ML IV SOSY
PREFILLED_SYRINGE | INTRAVENOUS | Status: AC
  Filled 2018-12-12: qty 10

## 2018-12-12 MED ORDER — METOCLOPRAMIDE HCL 5 MG PO TABS: 5.0000 mg | ORAL_TABLET | Freq: Three times a day (TID) | ORAL | Status: DC | PRN

## 2018-12-12 MED ORDER — HYDROMORPHONE HCL 1 MG/ML IJ SOLN
0.5000 mg | INTRAMUSCULAR | Status: DC | PRN
  Administered 2018-12-13: 0.5 mg via INTRAVENOUS
  Filled 2018-12-12: qty 1

## 2018-12-12 MED ORDER — CLONIDINE HCL (ANALGESIA) 100 MCG/ML EP SOLN
EPIDURAL | Status: AC
  Filled 2018-12-12: qty 10

## 2018-12-12 MED ORDER — ACETAMINOPHEN 10 MG/ML IV SOLN
INTRAVENOUS | Status: AC
  Filled 2018-12-12: qty 100

## 2018-12-12 MED ORDER — LACTATED RINGERS IV SOLN
INTRAVENOUS | Status: DC | PRN
  Administered 2018-12-12 (×2): via INTRAVENOUS

## 2018-12-12 MED ORDER — MORPHINE SULFATE (PF) 4 MG/ML IV SOLN
INTRAVENOUS | Status: AC
  Filled 2018-12-12: qty 2

## 2018-12-12 MED ORDER — ONDANSETRON HCL 4 MG PO TABS: 4.0000 mg | ORAL_TABLET | Freq: Four times a day (QID) | ORAL | Status: DC | PRN

## 2018-12-12 MED ORDER — METOCLOPRAMIDE HCL 5 MG/ML IJ SOLN
5.0000 mg | Freq: Three times a day (TID) | INTRAMUSCULAR | Status: DC | PRN
  Administered 2018-12-13: 5 mg via INTRAVENOUS
  Administered 2018-12-15: 10 mg via INTRAVENOUS
  Filled 2018-12-12 (×2): qty 2

## 2018-12-12 MED ORDER — PHENYLEPHRINE HCL (PRESSORS) 10 MG/ML IV SOLN
INTRAVENOUS | Status: DC | PRN
  Administered 2018-12-12: 80 ug via INTRAVENOUS
  Administered 2018-12-12: 120 ug via INTRAVENOUS
  Administered 2018-12-12: 80 ug via INTRAVENOUS
  Administered 2018-12-12: 120 ug via INTRAVENOUS

## 2018-12-12 MED ORDER — ACETAMINOPHEN 325 MG PO TABS: 650.0000 mg | ORAL_TABLET | ORAL | Status: DC | PRN

## 2018-12-12 MED ORDER — LACTATED RINGERS IV SOLN
INTRAVENOUS | Status: AC
  Administered 2018-12-13: via INTRAVENOUS

## 2018-12-12 MED ORDER — PHENYLEPHRINE 40 MCG/ML (10ML) SYRINGE FOR IV PUSH (FOR BLOOD PRESSURE SUPPORT)
PREFILLED_SYRINGE | INTRAVENOUS | Status: AC
  Filled 2018-12-12: qty 10

## 2018-12-12 MED ORDER — MORPHINE SULFATE (PF) 4 MG/ML IV SOLN
4.0000 mg | Freq: Once | INTRAVENOUS | Status: DC
  Filled 2018-12-12: qty 1

## 2018-12-12 MED ORDER — BUPIVACAINE HCL (PF) 0.25 % IJ SOLN
INTRAMUSCULAR | Status: DC | PRN
  Administered 2018-12-12: 30 mL

## 2018-12-12 MED ORDER — MORPHINE SULFATE 2 MG/ML IJ SOLN
INTRAMUSCULAR | Status: AC | PRN
  Administered 2018-12-12: 4 mg via INTRAVENOUS

## 2018-12-12 MED ORDER — EPHEDRINE 5 MG/ML INJ
INTRAVENOUS | Status: AC
  Filled 2018-12-12: qty 10

## 2018-12-12 MED ORDER — ACETAMINOPHEN 325 MG PO TABS: 325.0000 mg | ORAL_TABLET | Freq: Four times a day (QID) | ORAL | Status: DC | PRN

## 2018-12-12 MED ORDER — MORPHINE SULFATE (PF) 2 MG/ML IV SOLN: 2.0000 mg | INTRAVENOUS | Status: DC | PRN

## 2018-12-12 MED ORDER — MORPHINE SULFATE (PF) 4 MG/ML IV SOLN
INTRAVENOUS | Status: DC | PRN
  Administered 2018-12-12: 8 mg

## 2018-12-12 MED ORDER — LEVOTHYROXINE SODIUM 125 MCG PO TABS: 125.0000 ug | ORAL_TABLET | Freq: Every day | ORAL | Status: DC

## 2018-12-12 MED ORDER — POTASSIUM CHLORIDE IN NACL 20-0.9 MEQ/L-% IV SOLN
INTRAVENOUS | Status: DC
  Administered 2018-12-13 – 2018-12-14 (×4): via INTRAVENOUS
  Filled 2018-12-12 (×4): qty 1000

## 2018-12-12 MED ORDER — CEFAZOLIN SODIUM-DEXTROSE 1-4 GM/50ML-% IV SOLN
1.0000 g | Freq: Once | INTRAVENOUS | Status: AC
  Administered 2018-12-12: 1 g via INTRAVENOUS
  Filled 2018-12-12: qty 50

## 2018-12-12 MED ORDER — TRAMADOL HCL 50 MG PO TABS
50.0000 mg | ORAL_TABLET | Freq: Four times a day (QID) | ORAL | Status: DC
  Administered 2018-12-13 – 2018-12-16 (×14): 50 mg via ORAL
  Filled 2018-12-12 (×15): qty 1

## 2018-12-12 MED ORDER — DEXAMETHASONE SODIUM PHOSPHATE 10 MG/ML IJ SOLN
INTRAMUSCULAR | Status: DC | PRN
  Administered 2018-12-12: 10 mg via INTRAVENOUS

## 2018-12-12 MED ORDER — PANTOPRAZOLE SODIUM 40 MG PO TBEC
40.0000 mg | DELAYED_RELEASE_TABLET | Freq: Every day | ORAL | Status: DC
  Administered 2018-12-13: 40 mg via ORAL
  Filled 2018-12-12: qty 1

## 2018-12-12 MED ORDER — CLONIDINE HCL (ANALGESIA) 100 MCG/ML EP SOLN
EPIDURAL | Status: DC | PRN
  Administered 2018-12-12: 100 ug

## 2018-12-12 MED ORDER — ONDANSETRON HCL 4 MG/2ML IJ SOLN
INTRAMUSCULAR | Status: DC | PRN
  Administered 2018-12-12: 4 mg via INTRAVENOUS

## 2018-12-12 MED ORDER — ASPIRIN 81 MG PO CHEW
81.0000 mg | CHEWABLE_TABLET | Freq: Every day | ORAL | Status: DC
  Administered 2018-12-13 – 2018-12-16 (×4): 81 mg via ORAL
  Filled 2018-12-12 (×4): qty 1

## 2018-12-12 MED ORDER — LIDOCAINE 2% (20 MG/ML) 5 ML SYRINGE
INTRAMUSCULAR | Status: DC | PRN
  Administered 2018-12-12: 60 mg via INTRAVENOUS

## 2018-12-12 MED ORDER — DEXAMETHASONE SODIUM PHOSPHATE 10 MG/ML IJ SOLN
INTRAMUSCULAR | Status: AC
  Filled 2018-12-12: qty 1

## 2018-12-12 MED ORDER — 0.9 % SODIUM CHLORIDE (POUR BTL) OPTIME
TOPICAL | Status: DC | PRN
  Administered 2018-12-12 (×6): 1000 mL

## 2018-12-12 SURGICAL SUPPLY — 95 items
BIT DRILL 2.7 QC CANN 155 (BIT) ×1 IMPLANT
BIT DRILL 2.7 QC CANN 155MM (BIT) ×1
BIT DRILL QC 2.0 SHORT EVOS SM (DRILL) IMPLANT
BIT DRILL QC 2.5MM SHRT EVO SM (DRILL) IMPLANT
BLADE SURG 10 STRL SS (BLADE) IMPLANT
BLADE SURG 15 STRL LF DISP TIS (BLADE) IMPLANT
BLADE SURG 15 STRL SS (BLADE) ×3
BNDG CMPR 9X4 STRL LF SNTH (GAUZE/BANDAGES/DRESSINGS) ×1
BNDG CMPR MED 10X6 ELC LF (GAUZE/BANDAGES/DRESSINGS) ×1
BNDG COHESIVE 6X5 TAN STRL LF (GAUZE/BANDAGES/DRESSINGS) IMPLANT
BNDG ELASTIC 4X5.8 VLCR STR LF (GAUZE/BANDAGES/DRESSINGS) ×3 IMPLANT
BNDG ELASTIC 6X10 VLCR STRL LF (GAUZE/BANDAGES/DRESSINGS) ×3 IMPLANT
BNDG ELASTIC 6X5.8 VLCR STR LF (GAUZE/BANDAGES/DRESSINGS) ×4 IMPLANT
BNDG ESMARK 4X9 LF (GAUZE/BANDAGES/DRESSINGS) ×3 IMPLANT
BNDG GAUZE ELAST 4 BULKY (GAUZE/BANDAGES/DRESSINGS) ×3 IMPLANT
CANISTER WOUNDNEG PRESSURE 500 (CANNISTER) ×2 IMPLANT
COVER MAYO STAND STRL (DRAPES) IMPLANT
COVER SURGICAL LIGHT HANDLE (MISCELLANEOUS) ×3 IMPLANT
COVER WAND RF STERILE (DRAPES) ×3 IMPLANT
CUFF TOURN SGL QUICK 34 (TOURNIQUET CUFF)
CUFF TOURN SGL QUICK 42 (TOURNIQUET CUFF) IMPLANT
CUFF TRNQT CYL 34X4.125X (TOURNIQUET CUFF) IMPLANT
DRAPE C-ARM 42X72 X-RAY (DRAPES) ×3 IMPLANT
DRAPE INCISE IOBAN 66X45 STRL (DRAPES) IMPLANT
DRAPE SURG 17X23 STRL (DRAPES) ×3 IMPLANT
DRAPE U-SHAPE 47X51 STRL (DRAPES) ×3 IMPLANT
DRILL QC 2.0 SHORT EVOS SM (DRILL) ×3
DRILL QC 2.5MM SHORT EVOS SM (DRILL) ×3
DRSG AQUACEL AG ADV 3.5X10 (GAUZE/BANDAGES/DRESSINGS) ×2 IMPLANT
DRSG PAD ABDOMINAL 8X10 ST (GAUZE/BANDAGES/DRESSINGS) ×11 IMPLANT
DRSG XEROFORM 1X8 (GAUZE/BANDAGES/DRESSINGS) ×2 IMPLANT
DURAPREP 26ML APPLICATOR (WOUND CARE) IMPLANT
ELECT CAUTERY BLADE 6.4 (BLADE) ×3 IMPLANT
ELECT REM PT RETURN 9FT ADLT (ELECTROSURGICAL) ×3
ELECTRODE REM PT RTRN 9FT ADLT (ELECTROSURGICAL) ×1 IMPLANT
GAUZE SPONGE 4X4 12PLY STRL (GAUZE/BANDAGES/DRESSINGS) ×3 IMPLANT
GAUZE SPONGE 4X4 12PLY STRL LF (GAUZE/BANDAGES/DRESSINGS) ×2 IMPLANT
GAUZE XEROFORM 5X9 LF (GAUZE/BANDAGES/DRESSINGS) ×3 IMPLANT
GLOVE BIOGEL PI IND STRL 7.5 (GLOVE) ×1 IMPLANT
GLOVE BIOGEL PI IND STRL 8 (GLOVE) ×1 IMPLANT
GLOVE BIOGEL PI INDICATOR 7.5 (GLOVE) ×2
GLOVE BIOGEL PI INDICATOR 8 (GLOVE) ×2
GLOVE ECLIPSE 7.0 STRL STRAW (GLOVE) ×3 IMPLANT
GLOVE SURG ORTHO 8.0 STRL STRW (GLOVE) ×3 IMPLANT
GOWN STRL REUS W/ TWL LRG LVL3 (GOWN DISPOSABLE) ×3 IMPLANT
GOWN STRL REUS W/TWL LRG LVL3 (GOWN DISPOSABLE) ×9
GUIDE PIN 1.3 (PIN) ×9
HANDPIECE INTERPULSE COAX TIP (DISPOSABLE)
KIT BASIN OR (CUSTOM PROCEDURE TRAY) ×3 IMPLANT
KIT PREVENA INCISION MGT 13 (CANNISTER) ×2 IMPLANT
KIT TURNOVER KIT B (KITS) ×3 IMPLANT
MANIFOLD NEPTUNE II (INSTRUMENTS) ×3 IMPLANT
NDL HYPO 25GX1X1/2 BEV (NEEDLE) ×1 IMPLANT
NEEDLE 22X1 1/2 (OR ONLY) (NEEDLE) ×2 IMPLANT
NEEDLE HYPO 25GX1X1/2 BEV (NEEDLE) ×3 IMPLANT
NS IRRIG 1000ML POUR BTL (IV SOLUTION) ×3 IMPLANT
PACK ORTHO EXTREMITY (CUSTOM PROCEDURE TRAY) ×3 IMPLANT
PAD ARMBOARD 7.5X6 YLW CONV (MISCELLANEOUS) ×6 IMPLANT
PAD CAST 4YDX4 CTTN HI CHSV (CAST SUPPLIES) ×2 IMPLANT
PADDING CAST COTTON 4X4 STRL (CAST SUPPLIES) ×9
PADDING CAST COTTON 6X4 STRL (CAST SUPPLIES) ×2 IMPLANT
PIN GUIDE 1.3 (PIN) ×3 IMPLANT
PLATE 5H L 81MM FIBULA EVOS (Plate) ×3 IMPLANT
SCREW CANNULATED 4.1X40 (Screw) ×4 IMPLANT
SCREW CORT 2.7X14 T8 EVOS (Screw) ×2 IMPLANT
SCREW CORT 2.7X16 STAR T8 EVOS (Screw) ×2 IMPLANT
SCREW CORT 2.7X20 T8 ST EVOS (Screw) ×2 IMPLANT
SCREW CORT 3.5X10MM ST EVOS (Screw) ×3 IMPLANT
SCREW CORT 3.5X11 ST EVOS (Screw) ×8 IMPLANT
SCREW CORT 3.5X13MM (Screw) ×2 IMPLANT
SCREW CORT 3.5X16 ST EVOS (Screw) ×2 IMPLANT
SCREW EVOS 2.7X11 LOCK T8 (Screw) ×4 IMPLANT
SCREW LOCK 2.7X8 (Screw) ×3 IMPLANT
SCREW LOCK 9X2.7 (Screw) ×2 IMPLANT
SCREW LOCK T8 8X2.7XST (Screw) ×1 IMPLANT
SET HNDPC FAN SPRY TIP SCT (DISPOSABLE) IMPLANT
STOCKINETTE IMPERVIOUS 9X36 MD (GAUZE/BANDAGES/DRESSINGS) IMPLANT
SUCTION FRAZIER HANDLE 10FR (MISCELLANEOUS) ×2
SUCTION TUBE FRAZIER 10FR DISP (MISCELLANEOUS) ×1 IMPLANT
SUT ETHILON 3 0 PS 1 (SUTURE) ×10 IMPLANT
SUT MNCRL AB 3-0 PS2 18 (SUTURE) IMPLANT
SUT VIC AB 0 CT1 27 (SUTURE) ×6
SUT VIC AB 0 CT1 27XBRD ANBCTR (SUTURE) IMPLANT
SUT VIC AB 2-0 CT1 27 (SUTURE) ×6
SUT VIC AB 2-0 CT1 TAPERPNT 27 (SUTURE) IMPLANT
SUT VIC AB 2-0 CTB1 (SUTURE) ×3 IMPLANT
SUT VIC AB 3-0 SH 27 (SUTURE) ×6
SUT VIC AB 3-0 SH 27X BRD (SUTURE) ×1 IMPLANT
SYR CONTROL 10ML LL (SYRINGE) ×5 IMPLANT
TOWEL GREEN STERILE (TOWEL DISPOSABLE) ×3 IMPLANT
TOWEL GREEN STERILE FF (TOWEL DISPOSABLE) ×3 IMPLANT
TUBE CONNECTING 12'X1/4 (SUCTIONS) ×1
TUBE CONNECTING 12X1/4 (SUCTIONS) ×2 IMPLANT
WATER STERILE IRR 1000ML POUR (IV SOLUTION) ×3 IMPLANT
YANKAUER SUCT BULB TIP NO VENT (SUCTIONS) IMPLANT
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
## 2018-12-12 NOTE — ED Notes (Signed)
TRN - Monico Hoar, RN at bedside- responded to Level 2 trauma

## 2018-12-12 NOTE — Anesthesia Procedure Notes (Signed)
Procedure Name: Intubation Date/Time: 12/12/2018 8:11 PM Performed by: Claris Che, CRNA Pre-anesthesia Checklist: Patient identified, Emergency Drugs available, Suction available, Patient being monitored and Timeout performed Patient Re-evaluated:Patient Re-evaluated prior to induction Oxygen Delivery Method: Circle system utilized Preoxygenation: Pre-oxygenation with 100% oxygen Induction Type: IV induction, Rapid sequence and Cricoid Pressure applied Ventilation: Mask ventilation without difficulty Laryngoscope Size: Mac and 3 Grade View: Grade III Tube type: Oral Tube size: 7.5 mm Number of attempts: 3 Airway Equipment and Method: Stylet and Video-laryngoscopy Placement Confirmation: ETT inserted through vocal cords under direct vision,  positive ETCO2 and breath sounds checked- equal and bilateral Secured at: 23 cm Tube secured with: Tape Dental Injury: Teeth and Oropharynx as per pre-operative assessment  Difficulty Due To: Difficulty was unanticipated, Difficult Airway- due to anterior larynx and Difficult Airway- due to reduced neck mobility

## 2018-12-12 NOTE — Progress Notes (Signed)
Orthopedic Tech Progress Note Patient Details:  [name] 05-16-1940 [id number] Level 2 trauma. Assisted with getting patient off of backboard   Patient ID: [name], female   DOB: 12-Nov-1940, 78 y.o.   MRN: [id number]   Janit Pagan 12/12/2018, 5:11 PM

## 2018-12-12 NOTE — Transfer of Care (Signed)
Immediate Anesthesia Transfer of Care Note  Patient: Amanda [name]  Procedure(s) Performed: OPEN REDUCTION INTERNAL FIXATION (ORIF) ANKLE FRACTURE (Left Ankle)  Patient Location: PACU  Anesthesia Type:General  Level of Consciousness: sedated, drowsy, patient cooperative and responds to stimulation  Airway & Oxygen Therapy: Patient Spontanous Breathing and Patient connected to face mask oxygen  Post-op Assessment: Report given to RN, Post -op Vital signs reviewed and stable and Patient moving all extremities X 4  Post vital signs: Reviewed and stable  Last Vitals:  Vitals Value Taken Time  BP 132/62 12/12/18 2236  Temp    Pulse 78 12/12/18 2238  Resp 14 12/12/18 2238  SpO2 100 % 12/12/18 2238  Vitals shown include unvalidated device data.  Last Pain:  Vitals:   12/12/18 1851  TempSrc:   PainSc: 8          Complications: No apparent anesthesia complications

## 2018-12-12 NOTE — ED Triage Notes (Signed)
To ED via [facility]
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
## 2018-12-13 LAB — BASIC METABOLIC PANEL
Anion gap: 12 (ref 5–15)
BUN: 20 mg/dL (ref 8–23)
CO2: 22 mmol/L (ref 22–32)
Calcium: 8.7 mg/dL — ABNORMAL LOW (ref 8.9–10.3)
Chloride: 104 mmol/L (ref 98–111)
Creatinine, Ser: 1 mg/dL (ref 0.44–1.00)
GFR calc Af Amer: >60 mL/min (ref >60)
GFR calc non Af Amer: 54 mL/min — ABNORMAL LOW (ref >60)
Glucose, Bld: 204 mg/dL — ABNORMAL HIGH (ref 70–99)
Potassium: 4 mmol/L (ref 3.5–5.1)
Sodium: 138 mmol/L (ref 135–145)

## 2018-12-13 LAB — CBC
HCT: 24.2 % — ABNORMAL LOW (ref 36.0–46.0)
Hemoglobin: 8.3 g/dL — ABNORMAL LOW (ref 12.0–15.0)
MCH: 28.6 pg (ref 26.0–34.0)
MCHC: 34.3 g/dL (ref 30.0–36.0)
MCV: 83.4 fL (ref 80.0–100.0)
Platelets: 125 10*3/uL — ABNORMAL LOW (ref 150–400)
RBC: 2.9 MIL/uL — ABNORMAL LOW (ref 3.87–5.11)
RDW: 12.8 % (ref 11.5–15.5)
WBC: 7.8 10*3/uL (ref 4.0–10.5)
nRBC: 0 % (ref 0.0–0.2)

## 2018-12-13 MED ORDER — PANTOPRAZOLE SODIUM 40 MG PO TBEC
40.0000 mg | DELAYED_RELEASE_TABLET | Freq: Every day | ORAL | Status: DC
  Administered 2018-12-14 – 2018-12-16 (×3): 40 mg via ORAL
  Filled 2018-12-13 (×3): qty 1

## 2018-12-13 MED ORDER — LEVOTHYROXINE SODIUM 100 MCG PO TABS
100.0000 ug | ORAL_TABLET | Freq: Every day | ORAL | Status: DC
  Administered 2018-12-14 – 2018-12-16 (×3): 100 ug via ORAL
  Filled 2018-12-13 (×3): qty 1

## 2018-12-13 MED ORDER — LEVOTHYROXINE SODIUM 25 MCG PO TABS
125.0000 ug | ORAL_TABLET | Freq: Every day | ORAL | Status: DC
  Administered 2018-12-13: 125 ug via ORAL
  Filled 2018-12-13: qty 1

## 2018-12-13 MED ORDER — ENOXAPARIN SODIUM 40 MG/0.4ML ~~LOC~~ SOLN
40.0000 mg | SUBCUTANEOUS | Status: DC
  Administered 2018-12-14 – 2018-12-16 (×3): 40 mg via SUBCUTANEOUS
  Filled 2018-12-13 (×3): qty 0.4

## 2018-12-13 MED ORDER — TRAZODONE HCL 50 MG PO TABS: 50.0000 mg | ORAL_TABLET | Freq: Every evening | ORAL | Status: DC | PRN

## 2018-12-13 MED ORDER — BUPROPION HCL ER (XL) 150 MG PO TB24
150.0000 mg | ORAL_TABLET | Freq: Every day | ORAL | Status: DC
  Administered 2018-12-13 – 2018-12-16 (×4): 150 mg via ORAL
  Filled 2018-12-13 (×4): qty 1

## 2018-12-13 NOTE — Plan of Care (Signed)

## 2018-12-13 NOTE — Op Note (Signed)
NAME: LIZANN, DOOL MEDICAL RECORD [id number] ACCOUNT [id number] DATE OF BIRTH:[birth year] FACILITY: [facility] LOCATION: [facility] PHYSICIAN:Vivan Vanderveer Randel Pigg, MD  OPERATIVE REPORT  DATE OF PROCEDURE:  12/12/2018  PREOPERATIVE DIAGNOSES: 1.  Left ankle open bimalleolar ankle fracture. 2.  Left peripatellar knee laceration, approximately 4 cm.  POSTOPERATIVE DIAGNOSES: 1.  Left ankle open bimalleolar ankle fracture. 2.  Left peripatellar knee laceration, approximately 4 cm.  PROCEDURE: 1.  Open reduction internal fixation of bimalleolar left ankle fracture. 2.  Excisional debridement of skin, subcutaneous tissue, muscle, fascia, and bone associated with open fracture on the medial side. 3.  Excisional debridement and closure of 4 cm laceration over the medial knee, intermediate complexity.  SURGEON:  Meredith Pel, MD  ASSISTANT:  Annie Main, PA-C   INDICATIONS:  The patient is a 78 year old patient who sustained an open left ankle fracture and open laceration around the knee, which did not penetrate the knee joint.  Presents now for operative management after explanation of risks and benefits.  PROCEDURE IN DETAIL:  The patient was brought to the operating room where general anesthetic was induced.  Perioperative IV antibiotics were maintained.  Left leg was prescrubbed with Hibiclens and then prescrubbed with alcohol and Betadine, which was  allowed to air dry.  Prepped with DuraPrep solution and draped in a sterile manner.  Charlie Pitter was used to cover the lateral incision.  It was used to seal the area over the medial incision on the left ankle.  Initially, the patient had about a 2.5 cm  laceration over the open medial malleolar fracture.  This was a transverse laceration.  It was extended anterior, distal, and posterior proximal.  The saphenous vein was protected along with nerve branches when possible.  Excisional debridement was  performed sharply with a knife and a curette of  devitalized-appearing tissue.  Periosteum around the fracture site was also removed, and thorough irrigation of the joint was performed with about 1 L of irrigating solution.  Following excisional  debridement and irrigation, this area was packed.  Attention was then directed towards the lateral side.  Incision was made over the lateral malleolus, extending proximally.  Skin and subcutaneous tissue were sharply divided.  Tourniquet was not  utilized.  Fracture was identified.  Bone quality was poor.  Comminution was present.  Fracture was reduced, but there was a split component of the lateral malleolus, which was reduced as well.  The length of the fibula was restored.  A Smith [employer]hew  5-hole plate was applied.  Under fluoroscopic guidance, 4 screws were placed proximally, and these were nonlocking screws.  Five screws were placed distally, which were locking screws into the relatively poor bone.  All in all good fit was achieved.   Following this, thorough irrigation was performed of that incision.  Attention was then directed towards the medial side.  The medial malleolar fracture was reduced under direct visualization, and two 40 mm partially threaded 4.0 cancellous screws were  placed with good fracture reduction achieved in the AP and lateral planes.  The mortise was stable.  No evidence of syndesmotic injury.  Thorough irrigation again performed on both incisions.  The medial incision was closed loosely using 3-0 Vicryl and  3-0 nylon.  Lateral incision was closed using 2-0 Vicryl and 3-0 nylon.  An Aquacel dressing was placed on the lateral side.  An incisional wound VAC, Prevena type, was placed on the medial incision because this is a high-risk incision.  Attention then  directed towards the knee laceration.  Thorough irrigation with a liter of irrigating solution was performed.  Skin, subcutaneous tissue, and fascia were then debrided.  Devitalized bone was removed.  A 3/4-inch Penrose drain  was placed and pulled out  proximally.  Two nylon sutures were placed then to reapproximate the skin loosely to allow for drainage.  A well-padded posterior splint was then applied.  The patient tolerated the procedure well without immediate complication.  Luke's assistance was  required at all times for retraction, opening and closing, and fracture fixation.  His assistance was a medical necessity.  LN/NUANCE  D:12/12/2018 T:12/13/2018 JOB:[id number]

## 2018-12-13 NOTE — Progress Notes (Signed)
  Subjective: Thomasine Beza is a 78 y.o. female s/p left ankle ORIF and excisional debridement.  They are POD1.  Pt's pain is controlled.    Objective: Vital signs in last 24 hours: Temp:  [97.5 F (36.4 C)-98.6 F (37 C)] 97.7 F (36.5 C) (10/11 0736) Pulse Rate:  [77-100] 85 (10/11 0736) Resp:  [13-27] 17 (10/11 0352) BP: (124-170)/(62-86) 135/62 (10/11 0736) SpO2:  [96 %-100 %] 97 % (10/11 0736) Weight:  [63.5 kg] 63.5 kg (10/10 1711)  Intake/Output from previous day: 10/10 0701 - 10/11 0700 In: 1963.5 [I.V.:1363.5; IV Piggyback:600] Out: 25 [Blood:25] Intake/Output this shift: No intake/output data recorded.  Exam:  No gross blood or drainage overlying the dressing Post-op splint in place Sensation intact distally in the left toes Able to dorsiflex and plantarflex the left toes   Labs: Recent Labs    12/12/18 1655 12/12/18 1656 12/13/18 0247  HGB 11.6* 11.8* 8.3*   Recent Labs    12/12/18 1656 12/13/18 0247  WBC 7.5 7.8  RBC 4.19 2.90*  HCT 35.8* 24.2*  PLT 227 125*   Recent Labs    12/12/18 1656 12/13/18 0247  NA 139 138  K 4.3 4.0  CL 104 104  CO2 23 22  BUN 21 20  CREATININE 0.99 1.00  GLUCOSE 124* 204*  CALCIUM 9.3 8.7*   Recent Labs    12/12/18 1656  INR 1.0    Assessment/Plan: Pt is POD1 s/p left ankle ORIF and excisional debridement    -Plan for discharge tomorrow afternoon after ~48 hours of IV abx  -Continue IV abx  -Follow-up with Dr. Marlou Sa in clinic on 10/15 for incisional vac removal and wound check  -NWB to LLE  -Recommend pain management of 5mg  Oxycodone q6h upon discharge  -DVT Prophylaxis: Aspirin 81mg      Seleste Tallman L Eryn Marandola 12/13/2018, 7:50 AM
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
## 2018-12-13 NOTE — Anesthesia Postprocedure Evaluation (Signed)
Anesthesia Post Note  Patient: Amanda [name]  Procedure(s) Performed: OPEN REDUCTION INTERNAL FIXATION (ORIF) ANKLE FRACTURE (Left Ankle)     Patient location during evaluation: PACU Anesthesia Type: General Level of consciousness: awake and alert Pain management: pain level controlled Vital Signs Assessment: post-procedure vital signs reviewed and stable Respiratory status: spontaneous breathing, nonlabored ventilation, respiratory function stable and patient connected to nasal cannula oxygen Cardiovascular status: stable and blood pressure returned to baseline Postop Assessment: no apparent nausea or vomiting Anesthetic complications: no    Last Vitals:  Vitals:   12/13/18 1611 12/13/18 2001  BP: (!) 145/61 127/66  Pulse: 91 85  Resp:  16  Temp: 36.9 C 36.7 C  SpO2: 95% 93%    Last Pain:  Vitals:   12/13/18 2001  TempSrc: Oral  PainSc:                  [name]

## 2018-12-14 ENCOUNTER — Encounter: Payer: Self-pay | Admitting: *Deleted

## 2018-12-14 MED ORDER — HEPARIN SOD (PORK) LOCK FLUSH 100 UNIT/ML IV SOLN
500.0000 [IU] | INTRAVENOUS | Status: DC
  Filled 2018-12-14: qty 5

## 2018-12-14 MED ORDER — CHLORHEXIDINE GLUCONATE CLOTH 2 % EX PADS
6.0000 | MEDICATED_PAD | Freq: Every day | CUTANEOUS | Status: DC
  Administered 2018-12-14 – 2018-12-16 (×3): 6 via TOPICAL

## 2018-12-14 MED ORDER — HEPARIN SOD (PORK) LOCK FLUSH 100 UNIT/ML IV SOLN
500.0000 [IU] | INTRAVENOUS | Status: DC | PRN
  Administered 2018-12-14: 500 [IU]
  Filled 2018-12-14: qty 5

## 2018-12-14 MED ORDER — INFLUENZA VAC A&B SA ADJ QUAD 0.5 ML IM PRSY
0.5000 mL | PREFILLED_SYRINGE | INTRAMUSCULAR | Status: AC
  Administered 2018-12-16: 0.5 mL via INTRAMUSCULAR
  Filled 2018-12-14: qty 0.5

## 2018-12-14 MED ORDER — ACETAMINOPHEN 325 MG PO TABS
650.0000 mg | ORAL_TABLET | ORAL | Status: DC
  Administered 2018-12-14 – 2018-12-16 (×12): 650 mg via ORAL
  Filled 2018-12-14 (×14): qty 2

## 2018-12-14 NOTE — Progress Notes (Signed)
Rehab Admissions Coordinator Note:  Per OT recommendation, patient was screened by Michel Santee for appropriateness for an Inpatient Acute Rehab Consult.  Note pt requesting to d/c home with home health therapies and husband support. If pt would like to be considered for CIR please place a consult order.   Michel Santee 12/14/2018, 1:07 PM  I can be reached at [phone].
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
## 2018-12-14 NOTE — Progress Notes (Signed)
  Subjective: Amanda [name] is a 78 y.o. female s/p left ankle ORIF and excisional debridement.  They are POD2.  Pt's pain is controlled.  She has been slowly improving with PT.  She requests disposition to inpatient rehabilitation to work on strengthening.    Objective: Vital signs in last 24 hours: Temp:  [98 F (36.7 C)-98.4 F (36.9 C)] 98 F (36.7 C) (10/12 1625) Pulse Rate:  [84-98] 84 (10/12 1625) Resp:  [15-19] 15 (10/12 1625) BP: (127-152)/(57-113) 132/57 (10/12 1625) SpO2:  [90 %-96 %] 96 % (10/12 1625)  Intake/Output from previous day: 10/11 0701 - 10/12 0700 In: 720 [P.O.:720] Out: 2150 [Urine:2100; Drains:50] Intake/Output this shift: Total I/O In: 240 [P.O.:240] Out: 300 [Urine:300]  Exam:  No gross blood or drainage overlying the dressing Post-op splint in place Sensation intact distally in the left toes Able to dorsiflex and plantarflex the left toes No cellulitis surrounding knee lacerations/incisions Knee dressing replaced   Labs: Recent Labs    12/12/18 1655 12/12/18 1656 12/13/18 0247  HGB 11.6* 11.8* 8.3*   Recent Labs    12/12/18 1656 12/13/18 0247  WBC 7.5 7.8  RBC 4.19 2.90*  HCT 35.8* 24.2*  PLT 227 125*   Recent Labs    12/12/18 1656 12/13/18 0247  NA 139 138  K 4.3 4.0  CL 104 104  CO2 23 22  BUN 21 20  CREATININE 0.99 1.00  GLUCOSE 124* 204*  CALCIUM 9.3 8.7*   Recent Labs    12/12/18 1656  INR 1.0    Assessment/Plan: Pt is POD2 s/p left ankle ORIF and excisional debridement    -Disposition per Trauma team.  Clear from Orthopedic standpoint. Patient requests inpatient rehab  -Continue IV abx  -Follow-up with Dr. Marlou Sa in clinic on 10/15 for incisional vac removal and wound check  -NWB to LLE  -Recommend pain management of 5mg  Oxycodone q6h upon discharge  -DVT Prophylaxis: Aspirin 81mg      Gerrianne Scale Domino Holten 12/14/2018, 6:14 PM
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
## 2018-12-14 NOTE — Progress Notes (Signed)
Pt sat in the chair for several hours today. Pt needs mod x2 assist with transfers.  Assisted back to bed by PT. Pt is requesting to have the purwick back on when she is in bed due to difficult transfers in getting out of bed.

## 2018-12-14 NOTE — Progress Notes (Signed)
Patient given incentive spirometry.  RN explained the importance of incentive spirometry use every hour while awake to promote pulmonary health.  Patient states that she is familiar with incentive spirometers as she has one at home.   Patient able to achieve 250 during spirometry inhalation.

## 2018-12-14 NOTE — Plan of Care (Signed)

## 2018-12-15 ENCOUNTER — Inpatient Hospital Stay (HOSPITAL_COMMUNITY): Payer: Medicare Other

## 2018-12-15 ENCOUNTER — Encounter (HOSPITAL_COMMUNITY): Payer: Self-pay | Admitting: General Practice

## 2018-12-15 ENCOUNTER — Other Ambulatory Visit: Payer: Self-pay

## 2018-12-15 LAB — CBC
HCT: 18.5 % — ABNORMAL LOW (ref 36.0–46.0)
Hemoglobin: 6.3 g/dL — CL (ref 12.0–15.0)
MCH: 28.6 pg (ref 26.0–34.0)
MCHC: 34.1 g/dL (ref 30.0–36.0)
MCV: 84.1 fL (ref 80.0–100.0)
Platelets: 96 10*3/uL — ABNORMAL LOW (ref 150–400)
RBC: 2.2 MIL/uL — ABNORMAL LOW (ref 3.87–5.11)
RDW: 13.3 % (ref 11.5–15.5)
WBC: 7 10*3/uL (ref 4.0–10.5)
nRBC: 0 % (ref 0.0–0.2)

## 2018-12-15 LAB — BASIC METABOLIC PANEL
Anion gap: 9 (ref 5–15)
BUN: 14 mg/dL (ref 8–23)
CO2: 24 mmol/L (ref 22–32)
Calcium: 8.3 mg/dL — ABNORMAL LOW (ref 8.9–10.3)
Chloride: 105 mmol/L (ref 98–111)
Creatinine, Ser: 0.71 mg/dL (ref 0.44–1.00)
GFR calc Af Amer: >60 mL/min (ref >60)
GFR calc non Af Amer: >60 mL/min (ref >60)
Glucose, Bld: 105 mg/dL — ABNORMAL HIGH (ref 70–99)
Potassium: 5 mmol/L (ref 3.5–5.1)
Sodium: 138 mmol/L (ref 135–145)

## 2018-12-15 LAB — HEMOGLOBIN AND HEMATOCRIT, BLOOD
HCT: 26 % — ABNORMAL LOW (ref 36.0–46.0)
Hemoglobin: 8.7 g/dL — ABNORMAL LOW (ref 12.0–15.0)

## 2018-12-15 LAB — ABO/RH: ABO/RH(D): O POS

## 2018-12-15 LAB — PREPARE RBC (CROSSMATCH)

## 2018-12-15 MED ORDER — ENSURE ENLIVE PO LIQD
237.0000 mL | Freq: Two times a day (BID) | ORAL | Status: DC
  Administered 2018-12-15 – 2018-12-16 (×3): 237 mL via ORAL

## 2018-12-15 MED ORDER — SODIUM CHLORIDE 0.9 % IV SOLN
INTRAVENOUS | Status: DC
  Administered 2018-12-15: 11:00:00 via INTRAVENOUS

## 2018-12-15 MED ORDER — IPRATROPIUM-ALBUTEROL 0.5-2.5 (3) MG/3ML IN SOLN
3.0000 mL | Freq: Four times a day (QID) | RESPIRATORY_TRACT | Status: DC
  Administered 2018-12-15: 3 mL via RESPIRATORY_TRACT
  Filled 2018-12-15: qty 3

## 2018-12-15 MED ORDER — IPRATROPIUM-ALBUTEROL 0.5-2.5 (3) MG/3ML IN SOLN
3.0000 mL | Freq: Two times a day (BID) | RESPIRATORY_TRACT | Status: DC
  Administered 2018-12-15 – 2018-12-16 (×2): 3 mL via RESPIRATORY_TRACT
  Filled 2018-12-15 (×2): qty 3

## 2018-12-15 MED ORDER — LIDOCAINE 5 % EX PTCH
1.0000 | MEDICATED_PATCH | CUTANEOUS | Status: DC
  Administered 2018-12-15 – 2018-12-16 (×2): 1 via TRANSDERMAL
  Filled 2018-12-15 (×2): qty 1

## 2018-12-15 MED ORDER — SODIUM CHLORIDE 0.9% IV SOLUTION
Freq: Once | INTRAVENOUS | Status: AC
  Administered 2018-12-15: 09:00:00 via INTRAVENOUS

## 2018-12-15 MED ORDER — SODIUM CHLORIDE 0.9% IV SOLUTION
Freq: Once | INTRAVENOUS | Status: AC
  Administered 2018-12-15: 06:00:00 via INTRAVENOUS
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
## 2018-12-15 NOTE — Plan of Care (Signed)
  Problem: Education: Goal: Knowledge of General Education information will improve Description: Including pain rating scale, medication(s)/side effects and non-pharmacologic comfort measures Outcome: Progressing   Problem: Clinical Measurements: Goal: Will remain free from infection Outcome: Progressing Goal: Respiratory complications will improve Outcome: Progressing   Problem: Nutrition: Goal: Adequate nutrition will be maintained Outcome: Progressing   Problem: Pain Managment: Goal: General experience of comfort will improve Outcome: Progressing   Problem: Safety: Goal: Ability to remain free from injury will improve Outcome: Progressing   Problem: Skin Integrity: Goal: Risk for impaired skin integrity will decrease Outcome: [place], [place] 12/15/2018 1720

## 2018-12-15 NOTE — Plan of Care (Signed)

## 2018-12-15 NOTE — Progress Notes (Signed)
Patient hgb 6.3 Dr. Grandville Silos notified. Received a verbal order over the phone to administer 1 unit of blood. Currently waiting for results of type and screen. Blood transfusion consent obtained. Will continue to monitor patient.

## 2018-12-15 NOTE — Progress Notes (Signed)
Patient stable Wound VAC functional Remove incisional VAC on Thursday Change over to fracture boot at that time Okay for touchdown weightbearing for transfers only left leg Discussed that with [facility] who is the physical therapist working with her.

## 2018-12-15 NOTE — Progress Notes (Signed)
Inpatient Rehab Admissions:  Inpatient Rehab Consult received.  I met with patient and her husband at the bedside for rehabilitation assessment and to discuss goals and expectations of an inpatient rehab admission.  Both are hopeful for CIR.  Will plan on possible admission tomorrow pending bed availability and medical readiness.   Signed: Shann Medal, PT, DPT Admissions Coordinator [phone] 12/15/18  4:38 PM
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
## 2018-12-16 ENCOUNTER — Encounter (HOSPITAL_COMMUNITY): Payer: Self-pay | Admitting: Orthopedic Surgery

## 2018-12-16 ENCOUNTER — Encounter (HOSPITAL_COMMUNITY): Payer: Self-pay | Admitting: *Deleted

## 2018-12-16 ENCOUNTER — Encounter: Payer: Self-pay | Admitting: Hematology

## 2018-12-16 ENCOUNTER — Other Ambulatory Visit: Payer: Self-pay

## 2018-12-16 ENCOUNTER — Inpatient Hospital Stay (HOSPITAL_COMMUNITY)
Admission: RE | Admit: 2018-12-16 | Discharge: 2018-12-26 | DRG: 560 | Disposition: A | Discharge status: Home Health | Payer: Medicare Other | Source: Intra-hospital | Attending: Physical Medicine & Rehabilitation | Admitting: Physical Medicine & Rehabilitation

## 2018-12-16 DIAGNOSIS — S32018D Other fracture of first lumbar vertebra, subsequent encounter for fracture with routine healing: Secondary | ICD-10-CM

## 2018-12-16 DIAGNOSIS — D62 Acute posthemorrhagic anemia: Secondary | ICD-10-CM | POA: Diagnosis not present

## 2018-12-16 DIAGNOSIS — I10 Essential (primary) hypertension: Secondary | ICD-10-CM | POA: Diagnosis not present

## 2018-12-16 DIAGNOSIS — Z9981 Dependence on supplemental oxygen: Secondary | ICD-10-CM

## 2018-12-16 DIAGNOSIS — Z79899 Other long term (current) drug therapy: Secondary | ICD-10-CM

## 2018-12-16 DIAGNOSIS — S82852S Displaced trimalleolar fracture of left lower leg, sequela: Secondary | ICD-10-CM | POA: Diagnosis not present

## 2018-12-16 DIAGNOSIS — T07XXXA Unspecified multiple injuries, initial encounter: Secondary | ICD-10-CM | POA: Diagnosis present

## 2018-12-16 DIAGNOSIS — K5903 Drug induced constipation: Secondary | ICD-10-CM

## 2018-12-16 DIAGNOSIS — S81012D Laceration without foreign body, left knee, subsequent encounter: Secondary | ICD-10-CM

## 2018-12-16 DIAGNOSIS — R0989 Other specified symptoms and signs involving the circulatory and respiratory systems: Secondary | ICD-10-CM | POA: Diagnosis not present

## 2018-12-16 DIAGNOSIS — D696 Thrombocytopenia, unspecified: Secondary | ICD-10-CM | POA: Diagnosis not present

## 2018-12-16 DIAGNOSIS — E039 Hypothyroidism, unspecified: Secondary | ICD-10-CM

## 2018-12-16 DIAGNOSIS — Z419 Encounter for procedure for purposes other than remedying health state, unspecified: Secondary | ICD-10-CM

## 2018-12-16 DIAGNOSIS — M7989 Other specified soft tissue disorders: Secondary | ICD-10-CM | POA: Diagnosis not present

## 2018-12-16 DIAGNOSIS — Y92410 Unspecified street and highway as the place of occurrence of the external cause: Secondary | ICD-10-CM

## 2018-12-16 DIAGNOSIS — Z9221 Personal history of antineoplastic chemotherapy: Secondary | ICD-10-CM

## 2018-12-16 DIAGNOSIS — C8590 Non-Hodgkin lymphoma, unspecified, unspecified site: Secondary | ICD-10-CM | POA: Diagnosis present

## 2018-12-16 DIAGNOSIS — R350 Frequency of micturition: Secondary | ICD-10-CM | POA: Diagnosis not present

## 2018-12-16 DIAGNOSIS — S2241XA Multiple fractures of ribs, right side, initial encounter for closed fracture: Secondary | ICD-10-CM

## 2018-12-16 DIAGNOSIS — S82852D Displaced trimalleolar fracture of left lower leg, subsequent encounter for closed fracture with routine healing: Principal | ICD-10-CM

## 2018-12-16 DIAGNOSIS — G4700 Insomnia, unspecified: Secondary | ICD-10-CM

## 2018-12-16 DIAGNOSIS — S82899A Other fracture of unspecified lower leg, initial encounter for closed fracture: Secondary | ICD-10-CM | POA: Diagnosis present

## 2018-12-16 DIAGNOSIS — S2241XD Multiple fractures of ribs, right side, subsequent encounter for fracture with routine healing: Secondary | ICD-10-CM

## 2018-12-16 DIAGNOSIS — G8918 Other acute postprocedural pain: Secondary | ICD-10-CM | POA: Diagnosis not present

## 2018-12-16 DIAGNOSIS — S301XXD Contusion of abdominal wall, subsequent encounter: Secondary | ICD-10-CM | POA: Diagnosis not present

## 2018-12-16 DIAGNOSIS — R42 Dizziness and giddiness: Secondary | ICD-10-CM | POA: Diagnosis not present

## 2018-12-16 DIAGNOSIS — S32049D Unspecified fracture of fourth lumbar vertebra, subsequent encounter for fracture with routine healing: Secondary | ICD-10-CM

## 2018-12-16 DIAGNOSIS — S2220XD Unspecified fracture of sternum, subsequent encounter for fracture with routine healing: Secondary | ICD-10-CM | POA: Diagnosis not present

## 2018-12-16 DIAGNOSIS — S99911A Unspecified injury of right ankle, initial encounter: Secondary | ICD-10-CM | POA: Diagnosis not present

## 2018-12-16 DIAGNOSIS — S32029D Unspecified fracture of second lumbar vertebra, subsequent encounter for fracture with routine healing: Secondary | ICD-10-CM

## 2018-12-16 DIAGNOSIS — R52 Pain, unspecified: Secondary | ICD-10-CM

## 2018-12-16 HISTORY — DX: Essential (primary) hypertension: I10

## 2018-12-16 LAB — BPAM RBC
Blood Product Expiration Date: 202011172359
ISSUE DATE / TIME: 202010130634
Unit Type and Rh: 5100

## 2018-12-16 LAB — BASIC METABOLIC PANEL
Anion gap: 10 (ref 5–15)
BUN: 10 mg/dL (ref 8–23)
CO2: 25 mmol/L (ref 22–32)
Calcium: 8.1 mg/dL — ABNORMAL LOW (ref 8.9–10.3)
Chloride: 104 mmol/L (ref 98–111)
Creatinine, Ser: 0.76 mg/dL (ref 0.44–1.00)
GFR calc Af Amer: >60 mL/min (ref >60)
GFR calc non Af Amer: >60 mL/min (ref >60)
Glucose, Bld: 111 mg/dL — ABNORMAL HIGH (ref 70–99)
Potassium: 3.9 mmol/L (ref 3.5–5.1)
Sodium: 139 mmol/L (ref 135–145)

## 2018-12-16 LAB — CBC
HCT: 24.6 % — ABNORMAL LOW (ref 36.0–46.0)
Hemoglobin: 8.5 g/dL — ABNORMAL LOW (ref 12.0–15.0)
MCH: 29.7 pg (ref 26.0–34.0)
MCHC: 34.6 g/dL (ref 30.0–36.0)
MCV: 86 fL (ref 80.0–100.0)
Platelets: 117 10*3/uL — ABNORMAL LOW (ref 150–400)
RBC: 2.86 MIL/uL — ABNORMAL LOW (ref 3.87–5.11)
RDW: 14.3 % (ref 11.5–15.5)
WBC: 5.9 10*3/uL (ref 4.0–10.5)
nRBC: 0 % (ref 0.0–0.2)

## 2018-12-16 LAB — TYPE AND SCREEN
ABO/RH(D): O POS
Antibody Screen: NEGATIVE
Unit division: 0

## 2018-12-16 MED ORDER — ENOXAPARIN SODIUM 40 MG/0.4ML ~~LOC~~ SOLN
40.0000 mg | SUBCUTANEOUS | Status: DC
  Administered 2018-12-17 – 2018-12-26 (×10): 40 mg via SUBCUTANEOUS
  Filled 2018-12-16 (×10): qty 0.4

## 2018-12-16 MED ORDER — SORBITOL 70 % SOLN
30.0000 mL | Freq: Every day | Status: DC | PRN
  Administered 2018-12-18: 30 mL via ORAL
  Filled 2018-12-16: qty 30

## 2018-12-16 MED ORDER — ONDANSETRON HCL 4 MG PO TABS
4.0000 mg | ORAL_TABLET | Freq: Four times a day (QID) | ORAL | Status: DC | PRN
  Administered 2018-12-17 – 2018-12-25 (×8): 4 mg via ORAL
  Filled 2018-12-16 (×8): qty 1

## 2018-12-16 MED ORDER — ENOXAPARIN SODIUM 40 MG/0.4ML ~~LOC~~ SOLN: 40.0000 mg | SUBCUTANEOUS | Status: DC

## 2018-12-16 MED ORDER — POLYETHYLENE GLYCOL 3350 17 G PO PACK
17.0000 g | PACK | Freq: Every day | ORAL | Status: DC
  Administered 2018-12-16: 17 g via ORAL
  Filled 2018-12-16: qty 1

## 2018-12-16 MED ORDER — TRAZODONE HCL 50 MG PO TABS
50.0000 mg | ORAL_TABLET | Freq: Every evening | ORAL | Status: DC | PRN
  Administered 2018-12-22 – 2018-12-25 (×2): 50 mg via ORAL
  Filled 2018-12-16 (×5): qty 1

## 2018-12-16 MED ORDER — ONDANSETRON HCL 4 MG/2ML IJ SOLN: 4.0000 mg | Freq: Four times a day (QID) | INTRAMUSCULAR | Status: DC | PRN

## 2018-12-16 MED ORDER — ACETAMINOPHEN 325 MG PO TABS
650.0000 mg | ORAL_TABLET | ORAL | Status: DC
  Administered 2018-12-16 – 2018-12-17 (×6): 650 mg via ORAL
  Filled 2018-12-16 (×7): qty 2

## 2018-12-16 MED ORDER — LEVOTHYROXINE SODIUM 100 MCG PO TABS
100.0000 ug | ORAL_TABLET | Freq: Every day | ORAL | Status: DC
  Administered 2018-12-17 – 2018-12-26 (×10): 100 ug via ORAL
  Filled 2018-12-16 (×10): qty 1

## 2018-12-16 MED ORDER — PANTOPRAZOLE SODIUM 40 MG PO TBEC
40.0000 mg | DELAYED_RELEASE_TABLET | Freq: Every day | ORAL | Status: DC
  Administered 2018-12-17 – 2018-12-26 (×10): 40 mg via ORAL
  Filled 2018-12-16 (×10): qty 1

## 2018-12-16 MED ORDER — SENNOSIDES-DOCUSATE SODIUM 8.6-50 MG PO TABS
1.0000 | ORAL_TABLET | Freq: Two times a day (BID) | ORAL | Status: DC
  Administered 2018-12-16 – 2018-12-26 (×19): 1 via ORAL
  Filled 2018-12-16 (×20): qty 1

## 2018-12-16 MED ORDER — ENSURE ENLIVE PO LIQD
237.0000 mL | Freq: Two times a day (BID) | ORAL | Status: DC
  Administered 2018-12-17 – 2018-12-26 (×17): 237 mL via ORAL

## 2018-12-16 MED ORDER — ASPIRIN 81 MG PO CHEW
81.0000 mg | CHEWABLE_TABLET | Freq: Every day | ORAL | Status: DC
  Administered 2018-12-17 – 2018-12-26 (×10): 81 mg via ORAL
  Filled 2018-12-16 (×10): qty 1

## 2018-12-16 MED ORDER — POLYETHYLENE GLYCOL 3350 17 G PO PACK
17.0000 g | PACK | Freq: Every day | ORAL | Status: DC
  Administered 2018-12-17 – 2018-12-26 (×10): 17 g via ORAL
  Filled 2018-12-16 (×10): qty 1

## 2018-12-16 MED ORDER — TRAMADOL HCL 50 MG PO TABS
50.0000 mg | ORAL_TABLET | Freq: Four times a day (QID) | ORAL | Status: DC
  Administered 2018-12-16 – 2018-12-26 (×40): 50 mg via ORAL
  Filled 2018-12-16 (×41): qty 1

## 2018-12-16 MED ORDER — BUPROPION HCL ER (XL) 150 MG PO TB24
150.0000 mg | ORAL_TABLET | Freq: Every day | ORAL | Status: DC
  Administered 2018-12-17 – 2018-12-26 (×10): 150 mg via ORAL
  Filled 2018-12-16 (×10): qty 1

## 2018-12-16 MED ORDER — OXYCODONE HCL 5 MG PO TABS
5.0000 mg | ORAL_TABLET | ORAL | Status: DC | PRN
  Administered 2018-12-23 – 2018-12-26 (×13): 5 mg via ORAL
  Filled 2018-12-16: qty 2
  Filled 2018-12-16 (×4): qty 1
  Filled 2018-12-16 (×2): qty 2
  Filled 2018-12-16 (×5): qty 1
  Filled 2018-12-16: qty 2
  Filled 2018-12-16: qty 1

## 2018-12-16 MED ORDER — LIDOCAINE 5 % EX PTCH
1.0000 | MEDICATED_PATCH | CUTANEOUS | Status: DC
  Administered 2018-12-17 – 2018-12-18 (×2): 1 via TRANSDERMAL
  Filled 2018-12-16 (×2): qty 1

## 2018-12-16 MED ORDER — IPRATROPIUM-ALBUTEROL 0.5-2.5 (3) MG/3ML IN SOLN
3.0000 mL | Freq: Two times a day (BID) | RESPIRATORY_TRACT | Status: DC
  Administered 2018-12-16: 3 mL via RESPIRATORY_TRACT
  Filled 2018-12-16: qty 3

## 2018-12-16 NOTE — Progress Notes (Signed)
  Subjective: Amanda [name] is a 78 y.o. female s/p left ankle ORIF and excisional debridement.  They are POD4.  Pt's Left ankle pain is controlled and improving.  She denies any fevers, chills, drainage.    Objective: Vital signs in last 24 hours: Temp:  [98.4 F (36.9 C)-98.7 F (37.1 C)] 98.4 F (36.9 C) (10/14 0612) Pulse Rate:  [84-92] 87 (10/14 0734) Resp:  [14-16] 16 (10/14 0734) BP: (156-175)/(69-85) 174/83 (10/14 0734) SpO2:  [90 %-99 %] 93 % (10/14 0751)  Intake/Output from previous day: 10/13 0701 - 10/14 0700 In: 1116.4 [P.O.:360; I.V.:756.4] Out: 1100 [Urine:1100] Intake/Output this shift: Total I/O In: 120 [P.O.:120] Out: -   Exam:  No gross blood or drainage overlying the dressing Post-op splint in place Sensation intact distally in the left toes Able to flex/extend left toes No expressible drainage from knee lacerations/incisions 2+ radial pulse on left wrist. Regular rate and rhythm of pulse   Labs: Recent Labs    12/15/18 0231 12/15/18 1218 12/16/18 0449  HGB 6.3* 8.7* 8.5*   Recent Labs    12/15/18 0231 12/15/18 1218 12/16/18 0449  WBC 7.0  --  5.9  RBC 2.20*  --  2.86*  HCT 18.5* 26.0* 24.6*  PLT 96*  --  117*   Recent Labs    12/15/18 0231 12/16/18 0449  NA 138 139  K 5.0 3.9  CL 105 104  CO2 24 25  BUN 14 10  CREATININE 0.71 0.76  GLUCOSE 105* 111*  CALCIUM 8.3* 8.1*   No results for input(s): LABPT, INR in the last 72 hours.  Assessment/Plan: Pt is POD4 s/p left ankle ORIF and excisional debridement    -Wound vac to be removed on 10/15 with placement of aquacel dressing and transition into fracture boot  -Touchdown weightbearing for transfers only on LLE   Belisa Eichholz L Chanoch Mccleery 12/16/2018, 12:03 PM
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
## 2018-12-16 NOTE — Progress Notes (Signed)
Orthopedic Tech Progress Note Patient Details:  Amanda [name] 11/06/1940 [id number]  Patient ID: Amanda [name], female   DOB: [birth year], 78 y.o.   MRN: [id number]   Amanda [name] 12/16/2018, 11:54 AMCalled [employer] for brace.

## 2018-12-16 NOTE — Progress Notes (Signed)
Inpatient Rehab Admissions Coordinator:   I have approval from trauma team to admit pt to CIR today.  Note orders for aspen TLSO, will need to receive today in order to participate in therapies tomorrow.  Will let pt/family, RN, and CM know.   Shann Medal, PT, DPT Admissions Coordinator [phone] 12/16/18  10:59 AM

## 2018-12-16 NOTE — Progress Notes (Signed)
1645 Pt is A&O x4, L ankle with wound vac dressing dry and intact.  Discharged pt to 4W08 via bed. Report was given to [facility]. Pt's husband present.
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
## 2018-12-16 NOTE — Progress Notes (Signed)
Patient doing well Plan removal splint and removal wound VAC tomorrow with change over to Ace wrap and fracture boot Okay to discontinue fracture boot when she is sleeping Bone quality was not great so I do not want her weightbearing on it yet but touchdown weightbearing for transfers should be okay on that left leg Patient to be transferred to rehab today
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
## 2018-12-16 NOTE — Progress Notes (Signed)
Pt admitted to 4W08 with husband at bedside. Pt alert and oriented and bed alarm in place.
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
## 2018-12-16 NOTE — Progress Notes (Signed)
Report was given to [facility] at [facility].
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
## 2018-12-16 NOTE — Care Management Important Message (Signed)
Important Message  Patient Details  Name: Amanda [name] MRN: [id number] Date of Birth: [birth year]   Medicare Important Message Given:  Yes     Memory Argue 12/16/2018, 12:50 PM

## 2018-12-17 ENCOUNTER — Inpatient Hospital Stay (HOSPITAL_COMMUNITY): Payer: Medicare Other | Admitting: Occupational Therapy

## 2018-12-17 ENCOUNTER — Inpatient Hospital Stay (HOSPITAL_COMMUNITY): Payer: Medicare Other

## 2018-12-17 ENCOUNTER — Encounter: Payer: Self-pay | Admitting: *Deleted

## 2018-12-17 ENCOUNTER — Inpatient Hospital Stay (HOSPITAL_COMMUNITY): Payer: Self-pay

## 2018-12-17 ENCOUNTER — Telehealth: Payer: Self-pay | Admitting: Hematology

## 2018-12-17 ENCOUNTER — Other Ambulatory Visit: Payer: Self-pay

## 2018-12-17 DIAGNOSIS — D62 Acute posthemorrhagic anemia: Secondary | ICD-10-CM

## 2018-12-17 DIAGNOSIS — M7989 Other specified soft tissue disorders: Secondary | ICD-10-CM

## 2018-12-17 DIAGNOSIS — R350 Frequency of micturition: Secondary | ICD-10-CM

## 2018-12-17 DIAGNOSIS — T07XXXA Unspecified multiple injuries, initial encounter: Secondary | ICD-10-CM

## 2018-12-17 DIAGNOSIS — S82852S Displaced trimalleolar fracture of left lower leg, sequela: Secondary | ICD-10-CM

## 2018-12-17 LAB — CBC WITH DIFFERENTIAL/PLATELET
Abs Immature Granulocytes: 0.02 10*3/uL (ref 0.00–0.07)
Basophils Absolute: 0 10*3/uL (ref 0.0–0.1)
Basophils Relative: 1 %
Eosinophils Absolute: 0.3 10*3/uL (ref 0.0–0.5)
Eosinophils Relative: 8 %
HCT: 24.9 % — ABNORMAL LOW (ref 36.0–46.0)
Hemoglobin: 8.2 g/dL — ABNORMAL LOW (ref 12.0–15.0)
Immature Granulocytes: 1 %
Lymphocytes Relative: 10 %
Lymphs Abs: 0.4 10*3/uL — ABNORMAL LOW (ref 0.7–4.0)
MCH: 29 pg (ref 26.0–34.0)
MCHC: 32.9 g/dL (ref 30.0–36.0)
MCV: 88 fL (ref 80.0–100.0)
Monocytes Absolute: 0.7 10*3/uL (ref 0.1–1.0)
Monocytes Relative: 18 %
Neutro Abs: 2.5 10*3/uL (ref 1.7–7.7)
Neutrophils Relative %: 62 %
Platelets: 131 10*3/uL — ABNORMAL LOW (ref 150–400)
RBC: 2.83 MIL/uL — ABNORMAL LOW (ref 3.87–5.11)
RDW: 14.6 % (ref 11.5–15.5)
WBC: 4 10*3/uL (ref 4.0–10.5)
nRBC: 0 % (ref 0.0–0.2)

## 2018-12-17 LAB — URINALYSIS, COMPLETE (UACMP) WITH MICROSCOPIC
Bilirubin Urine: NEGATIVE
Glucose, UA: NEGATIVE mg/dL
Hgb urine dipstick: NEGATIVE
Ketones, ur: NEGATIVE mg/dL
Leukocytes,Ua: NEGATIVE
Nitrite: NEGATIVE
Protein, ur: NEGATIVE mg/dL
Specific Gravity, Urine: 1.016 (ref 1.005–1.030)
pH: 6 (ref 5.0–8.0)

## 2018-12-17 LAB — COMPREHENSIVE METABOLIC PANEL
ALT: 11 U/L (ref 0–44)
AST: 15 U/L (ref 15–41)
Albumin: 2.5 g/dL — ABNORMAL LOW (ref 3.5–5.0)
Alkaline Phosphatase: 61 U/L (ref 38–126)
Anion gap: 10 (ref 5–15)
BUN: 13 mg/dL (ref 8–23)
CO2: 25 mmol/L (ref 22–32)
Calcium: 8.2 mg/dL — ABNORMAL LOW (ref 8.9–10.3)
Chloride: 102 mmol/L (ref 98–111)
Creatinine, Ser: 0.7 mg/dL (ref 0.44–1.00)
GFR calc Af Amer: >60 mL/min (ref >60)
GFR calc non Af Amer: >60 mL/min (ref >60)
Glucose, Bld: 107 mg/dL — ABNORMAL HIGH (ref 70–99)
Potassium: 4 mmol/L (ref 3.5–5.1)
Sodium: 137 mmol/L (ref 135–145)
Total Bilirubin: 0.8 mg/dL (ref 0.3–1.2)
Total Protein: 4.7 g/dL — ABNORMAL LOW (ref 6.5–8.1)

## 2018-12-17 MED ORDER — AMLODIPINE BESYLATE 5 MG PO TABS
5.0000 mg | ORAL_TABLET | Freq: Every day | ORAL | Status: DC
  Administered 2018-12-18 – 2018-12-26 (×10): 5 mg via ORAL
  Filled 2018-12-17 (×9): qty 1

## 2018-12-17 MED ORDER — ACETAMINOPHEN 325 MG PO TABS
650.0000 mg | ORAL_TABLET | Freq: Four times a day (QID) | ORAL | Status: DC | PRN
  Administered 2018-12-18 – 2018-12-26 (×22): 650 mg via ORAL
  Filled 2018-12-17 (×25): qty 2

## 2018-12-17 MED ORDER — IPRATROPIUM-ALBUTEROL 0.5-2.5 (3) MG/3ML IN SOLN: 3.0000 mL | Freq: Four times a day (QID) | RESPIRATORY_TRACT | Status: DC | PRN

## 2018-12-17 NOTE — Progress Notes (Signed)
Orthopedic Tech Progress Note Patient Details:  Amanda [name] 1941/03/04 [id number] Was told not to apply CAM WALKER but to leave in room which I did. Ortho Devices Type of Ortho Device: CAM walker Ortho Device/Splint Location: LLE Ortho Device/Splint Interventions: Other (comment)   Post Interventions Patient Tolerated: Other (comment) Instructions Provided: Other (comment)   Amanda [name] 12/17/2018, 10:26 AM

## 2018-12-17 NOTE — Progress Notes (Signed)
During med pass tonight, patient expressed some concerns about her blood pressures & stated that she was on amlodipine prior to admission. She had her husband bring in the bottle to get the name off of it. The medication was listed in her home meds for admission but was not on her current MAR. Blood pressures have been increasing as she stated since admission. When attempting to give her the 2000 dose of tylenol that was scheduled, a warning flag displayed on the screen warning that she has almost reached the threshold for acetaminophen. Medication was held & the on call provider was contacted. Verbal orders were given to change the order for acetaminophen & make it Q6 hrs prn & to order the amlodipine. Orders were transcribed & patient/ husband was informed of the changes. Provider was called back to ask if there was any intervention for the present blood pressure reading. No new orders given. No acute distress noted, will continue to monitor for changes.
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
## 2018-12-17 NOTE — Progress Notes (Signed)
Occupational Therapy Session Note  Patient Details  Name: Amanda [name] MRN: [id number] Date of Birth: 25-May-1940  Today's Date: 12/17/2018 OT Individual Time: 1530-1600 OT Individual Time Calculation (min): 30 min    Short Term Goals: Week 1:  OT Short Term Goal 1 (Week 1): Pt will transfer to/ from BSC/ toilet with supervision OT Short Term Goal 2 (Week 1): pt will perform toileting with contact guard while maintaining weight bearing precautions OT Short Term Goal 3 (Week 1): Pt will bathe in seated position with AE as needed with min A  at shower level OT Short Term Goal 4 (Week 1): Pt will don LB clothing including footware with min A  Skilled Therapeutic Interventions/Progress Updates:  Pt received in recliner, no c/o pain and ready for tx. Pt dons TLSO with max A and completes stand pivot transfer to w/c with mod A. Pt reporting she would like to shower tomorrow and agreeable to practicing shower transfer with OT today. Pt completes w/c to TTB transfer with mod A and min VC to use grab bars and only put 10% weight through L foot. Pt reports she would like to get back to bed, completes stand pivot transfer from w/c to EOB with RW for support with min A. Exited session with pt in bed, RN present in room administering meds and all needs met.   Therapy Documentation Precautions:  Precautions Precautions: Other (comment) Precaution Comments: TLSO when OOB,  TWB on LLE for transfers only Required Braces or Orthoses: Splint/Cast, Other Brace Splint/Cast: CAM boot Other Brace: TLSO when OOB Restrictions Weight Bearing Restrictions: No LLE Weight Bearing: Touchdown weight bearing Other Position/Activity Restrictions: TWB on LLE for transfers only      Therapy/Group: Individual Therapy  Shalie Schremp 12/17/2018, 4:16 PM
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
## 2018-12-17 NOTE — Progress Notes (Signed)
Patient information reviewed and entered into eRehab System by Becky Ardian Haberland, PPS coordinator. Information including medical coding, function ability, and quality indicators will be reviewed and updated through discharge.   

## 2018-12-17 NOTE — Care Management (Signed)
Inpatient [facility] Individual Statement of Services  Patient Name:  Amanda [name]  Date:  12/17/2018  Welcome to the [facility].  Our goal is to provide you with an individualized program based on your diagnosis and situation, designed to meet your specific needs.  With this comprehensive rehabilitation program, you will be expected to participate in at least 3 hours of rehabilitation therapies Monday-Friday, with modified therapy programming on the weekends.  Your rehabilitation program will include the following services:  Physical Therapy (PT), Occupational Therapy (OT), 24 hour per day rehabilitation nursing, Therapeutic Recreaction (TR), Neuropsychology, Case Management (Social Worker), Rehabilitation Medicine, Nutrition Services and Pharmacy Services  Weekly team conferences will be held on Tuesdays to discuss your progress.  Your Social Worker will talk with you frequently to get your input and to update you on team discussions.  Team conferences with you and your family in attendance may also be held.  Expected length of stay: 10-14 days   Overall anticipated outcome: supervision   Depending on your progress and recovery, your program may change. Your Social Worker will coordinate services and will keep you informed of any changes. Your Social Worker's name and contact numbers are listed  below.  The following services may also be recommended but are not provided by the [facility] will be made to provide these services after discharge if needed.  Arrangements include referral to agencies that provide these services.  Your insurance has been verified to be:  Medicare; [place] Your primary doctor is:  Carollee Herter  Pertinent information will be shared with your doctor and your insurance company.  Social Worker:  [place], [place] or [phone]   Information discussed with and copy given to patient by: Lennart Pall, 12/17/2018, 4:25 PM
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
## 2018-12-17 NOTE — Progress Notes (Signed)
Physical Therapy Session Note  Patient Details  Name: [name] MRN: [id number] Date of Birth: 1941-01-20  Today's Date: 12/17/2018 PT Individual Time: 0920-1013 PT Individual Time Calculation (min): 53 min   Short Term Goals: Week 1:    Week 2:    Week 3:     Skilled Therapeutic Interventions/Progress Updates:  Pain 4/10 L knee, treatment to tolerance  Pt initially OOB in wc.  Transported to gym for time efficiency. Pt oriented to rehab unit en route to car.   Pt w/wound vac, 2L02 via [place], LLE softcast.   Pt performed car transfer using RW w/mod assist of 1 w/STS from wc/car, cues for TDWB LLE, and cues for sequencing and safety.  Extra time needed for transfer. Required 60min seated rest break following wc to car and before completing car to wc.  HR 99, 02 sats 90%.  Instructed w/deep breathing and nasal inspiration to maximize 02 benefits.  02 sats recovered 1-2 min. Transported back to room via wc, pt assists w/managing Vac.   WC to bed via SPT w/RW and min to mod assist TDWB L, set up assist.   Sit to supine w/max verbal cues for spinal precautions, sequencing, mod assist for LE mgmt. Pt rolls L and R c cues for NWB LLE and extra time. Nursing in for procedure, pt left supine w/nursing, bed alarm set.  Therapy Documentation Precautions:  Precautions Precautions: Other (comment) Precaution Comments: TLSO when OOB, wound vac, TWB on LLE for transfers only Required Braces or Orthoses: Splint/Cast, Other Brace Splint/Cast: L LE Other Brace: TLSO when OOB Restrictions Weight Bearing Restrictions: Yes LLE Weight Bearing: Touchdown weight bearing Other Position/Activity Restrictions: TWB on LLE for transfers only    Therapy/Group: Individual Therapy  Callie Fielding, [place] 12/17/2018, 10:29 AM

## 2018-12-17 NOTE — Progress Notes (Signed)
Bilateral lower extremity venous duplex has been completed. Preliminary results can be found in CV Proc through chart review.   12/17/18 6:39 PM Amanda [name] RVT

## 2018-12-17 NOTE — Progress Notes (Signed)
  Subjective: Amanda [name] is a 78 y.o. female s/p left ankle ORIF and excisional debridement.  They are POD5.  Pt's Left ankle pain is controlled and improving.  She denies any fevers, chills, drainage.  Postoperative wound vac still in place.    Objective: Vital signs in last 24 hours: Temp:  [97.9 F (36.6 C)-98.5 F (36.9 C)] 98 F (36.7 C) (10/15 1527) Pulse Rate:  [79-87] 79 (10/15 1527) Resp:  [16-19] 19 (10/15 1527) BP: (158-173)/(72-84) 158/73 (10/15 1527) SpO2:  [94 %-100 %] 100 % (10/15 1527) Weight:  [63.8 kg] 63.8 kg (10/14 1741)  Intake/Output from previous day: 10/14 0701 - 10/15 0700 In: 120 [P.O.:120] Out: -  Intake/Output this shift: Total I/O In: -  Out: 200 [Urine:200]  Exam:  No gross blood or drainage overlying the dressing Post-op splint in place.  Removed during the visit.  Wound vac was removed and the incision was examined.  Incision was clean, dry, and healing well.  No expressible drainage from incision.   Incision was covered with Aquacel dressing and patient placed into Cam Walker boot. Sensation intact distally in the left toes Able to flex/extend left toes Left leg compartments are soft and nontender   Labs: Recent Labs    12/15/18 0231 12/15/18 1218 12/16/18 0449 12/17/18 0533  HGB 6.3* 8.7* 8.5* 8.2*   Recent Labs    12/16/18 0449 12/17/18 0533  WBC 5.9 4.0  RBC 2.86* 2.83*  HCT 24.6* 24.9*  PLT 117* 131*   Recent Labs    12/16/18 0449 12/17/18 0533  NA 139 137  K 3.9 4.0  CL 104 102  CO2 25 25  BUN 10 13  CREATININE 0.76 0.70  GLUCOSE 111* 107*  CALCIUM 8.1* 8.2*   No results for input(s): LABPT, INR in the last 72 hours.  Assessment/Plan: Pt is POD5 s/p left ankle ORIF and excisional debridement    -Wound vac removed today.    -Patient placed in Cam walker boot  -Aquacel dressing applied to lateral ankle incision  -Touchdown weightbearing for transfers only on LLE  -F/u with Dr. Marlou Sa in clinic 2 weeks  postoperatively, if discharge from inpatient rehab by then   [place] 12/17/2018, 4:01 PM

## 2018-12-17 NOTE — Progress Notes (Signed)
Patient admitted to [place]. Spoke with Dr Maylon Peppers. We will reschedule her maintenance Rituxan to mid November.   Spoke to patient's husband, [name], and notified him of the change. Spoke to husband at length about patient updates and current condition.  Message sent to scheduler.

## 2018-12-17 NOTE — Telephone Encounter (Signed)
Called and spoke with husband regarding appointment date/time changes per 10/15 sch msg

## 2018-12-18 ENCOUNTER — Inpatient Hospital Stay (HOSPITAL_COMMUNITY): Payer: 59 | Admitting: Occupational Therapy

## 2018-12-18 ENCOUNTER — Inpatient Hospital Stay (HOSPITAL_COMMUNITY): Payer: 59

## 2018-12-18 ENCOUNTER — Inpatient Hospital Stay (HOSPITAL_COMMUNITY): Payer: 59 | Admitting: Physical Therapy

## 2018-12-18 LAB — URINE CULTURE: Culture: <10000 — AB

## 2018-12-18 MED ORDER — LIDOCAINE 5 % EX PTCH
2.0000 | MEDICATED_PATCH | CUTANEOUS | Status: DC
  Administered 2018-12-19 – 2018-12-26 (×8): 2 via TRANSDERMAL
  Filled 2018-12-18 (×8): qty 2

## 2018-12-18 NOTE — Progress Notes (Signed)
Occupational Therapy Session Note  Patient Details  Name: ANALIYA PORCO MRN: [id number] Date of Birth: [birth year]  Today's Date: 12/18/2018 OT Individual Time: 7026-3785 OT Individual Time Calculation (min): 75 min    Short Term Goals: Week 1:  OT Short Term Goal 1 (Week 1): Pt will transfer to/ from BSC/ toilet with supervision OT Short Term Goal 2 (Week 1): pt will perform toileting with contact guard while maintaining weight bearing precautions OT Short Term Goal 3 (Week 1): Pt will bathe in seated position with AE as needed with min A  at shower level OT Short Term Goal 4 (Week 1): Pt will don LB clothing including footware with min A  Skilled Therapeutic Interventions/Progress Updates:  Pt received in bed, c/o of nausea, requests an ensure. Pt agreeable to attempting bathing sitting EOB. Pt reports she feels supine <> sit EOB is hurting ribs and back; OT educated on log rolling for back precautions and safety. Pt educated on log rolling technique and completes log  with mod A.  Pt maintain sitting balance EOB with (S) and completes UB bathing with (S). Pt requesting to get on BSC. Completes EOB <> BSC stand pivot transfer with min A and RW. With transfer; pt demonstrates poor adherance to WB precautions Pt voids urine and completes toileting with min A. Pt transfers back EOB with same A as stated  Completes UB dressing with (S). Pt completes LB bathing sitting EOB with mod A for washing buttocks and peri area. Pt reporting nausea; completes sidelying <> supine with min A. Pt completes LB dressing at bed level with max A and OT educated pt on rolling bilaterally for pulling up pants to maintain back precautions. Pt dons TLSO with mod A for tightening around waist. Pt transfers EOB <> w/c with min A and RW and completes oral care seated at sink level. Washed pt hair for increased mood and self value seated in w/c at sink. Pt reporting nausea end of session and request to get back to bed.  Transfers sitting <>supine with min A for legs. Pt left in bed with bed alarm set and all needs met.      Therapy Documentation Precautions:  Precautions Precautions: Other (comment) Precaution Comments: TLSO when OOB,  TWB on LLE for transfers only Required Braces or Orthoses: Splint/Cast, Other Brace Splint/Cast: CAM boot Other Brace: TLSO when OOB Restrictions Weight Bearing Restrictions: Yes LLE Weight Bearing: Touchdown weight bearing Other Position/Activity Restrictions: TWB on LLE for transfers only   Therapy/Group: Individual Therapy  Sharnette Kitamura 12/18/2018, 8:33 AM

## 2018-12-18 NOTE — Progress Notes (Signed)
Physical Therapy Session Note  Patient Details  Name: Amanda [name] MRN: [id number] Date of Birth: 1941/02/16  Today's Date: 12/18/2018 PT Individual Time: 1445-1550 PT Individual Time Calculation (min): 65 min   Short Term Goals: Week 1:  PT Short Term Goal 1 (Week 1): Pt will perfrom bed mobility min A PT Short Term Goal 2 (Week 1): Pt will transfer with RW min A PT Short Term Goal 3 (Week 1): Pt will ambulate 11ft with RW min A  Skilled Therapeutic Interventions/Progress Updates:    Received pt sitting in recliner, pt agreeable to PT and states pain is 8/10 in back and chest - provided rest breaks and repositioning throughout for pain management. Pt wearing TLSO and L LE CAM boot for entire session until doffed TLSO at end of session prior to lying supine. Pt reports she continues to feel nausea with movement, RN provided anti-nausea medication during session and therapist provided ginger ale to drink with rest breaks throughout as needed. Session focused on functional transfers, improved tolerance to activity, B LE strength, and postural control. Pt performed 4 stand pivot transfers throughout session using RW while maintaining LLE WB precautions min A. Pt performed bed mobility consisting of sit<>supine, supine<>sit, and rolling with min A for LE management. Pt performed 2x8 RLE sitting marching and 2x8 seated knee extensions on RLE with bilateral UE support on mat. Pt attempted marching and knee extension on LLE but could only do 5 reps due to increase in low back pain. In supine with wedge under knees, pt performed 1x25 SAQ on bilateral LE and 2x20 reps bilateral LE's with 3lb ankle weight on RLE. Pt performed 3x12 pelvic tilts with wedge under BL LE's for back pain relief. Pt performed 1x12 and 1x8 SLR on RLE and attempted on LLE but was only able to perform 5reps due to low back pain. Pt performed ankle PF/DF/Inv/Eversion 2x20 on BL LE's in supine. Concluded session with pt supine in bed, all  needs within reach, and bed alarm on.   Therapy Documentation Precautions:  Precautions Precautions: Other (comment) Precaution Comments: TLSO when OOB,  TWB on LLE for transfers only Required Braces or Orthoses: Splint/Cast, Other Brace Splint/Cast: CAM boot Other Brace: TLSO when OOB Restrictions Weight Bearing Restrictions: Yes LLE Weight Bearing: Touchdown weight bearing Other Position/Activity Restrictions: TWB on LLE for transfers only  Therapy/Group: Individual Therapy  [place] PT, DPT   Page Spiro, PT, DPT 12/18/2018, 2:41 PM

## 2018-12-18 NOTE — Progress Notes (Signed)
Patient complained of nausea. Zofran given as ordered. Amanda [name] Amanda [name]

## 2018-12-18 NOTE — Progress Notes (Signed)
Occupational Therapy Session Note  Patient Details  Name: Amanda [name] MRN: [id number] Date of Birth: 15-Sep-1940  Today's Date: 12/18/2018 OT Individual Time: 5300-5110 OT Individual Time Calculation (min): 42 min    Short Term Goals: Week 1:  OT Short Term Goal 1 (Week 1): Pt will transfer to/ from BSC/ toilet with supervision OT Short Term Goal 2 (Week 1): pt will perform toileting with contact guard while maintaining weight bearing precautions OT Short Term Goal 3 (Week 1): Pt will bathe in seated position with AE as needed with min A  at shower level OT Short Term Goal 4 (Week 1): Pt will don LB clothing including footware with min A  Skilled Therapeutic Interventions/Progress Updates:    Pt received in bed with RN present in room, no initial c/o pain. Pt completes supine <> EOB with min A and don TLSO with min A for tightening around waist. OT educated pt on maintaining WB precautions on L food; encouraged pt to use arm strength and RW for stability. Pt completed EOB <> w/c transfer with min A and transported to therapy gym. Pt completes Dynavision in standing to address endurance and functional use of B UE. Pt able to complete two, one minute rounds of Dynavision then reports nausea d/t movement and fatigue. Vitals WNL, on 2L of O2. OT educated pt on vestibular systems that could be assessed. End of sessin pt left in recliner, safety belt on and all needs met.   Therapy Documentation Precautions:  Precautions Precautions: Other (comment) Precaution Comments: TLSO when OOB,  TWB on LLE for transfers only Required Braces or Orthoses: Splint/Cast, Other Brace Splint/Cast: CAM boot Other Brace: TLSO when OOB Restrictions Weight Bearing Restrictions: Yes LLE Weight Bearing: Touchdown weight bearing Other Position/Activity Restrictions: TWB on LLE for transfers only        Therapy/Group: Individual Therapy  Jericka Kadar 12/18/2018, 4:15 PM

## 2018-12-18 NOTE — Progress Notes (Addendum)
[place] PHYSICAL MEDICINE & REHABILITATION PROGRESS NOTE   Subjective/Complaints: Had a much better. Night. Didn't have as much urgency to void. Concerned about a "lump" in left pelvic/hypogastric area. Chest sore  ROS: Patient denies fever, rash, sore throat, blurred vision, nausea, vomiting, diarrhea, cough, shortness of breath or chest pain,   headache, or mood change.   Objective:   Vas [place] Lower Extremity Venous (dvt)  Result Date: 12/17/2018  Lower Venous Study Indications: Swelling.  Risk Factors: None identified. Comparison Study: No prior studies. Performing Technologist: Oliver Hum RVT  Examination Guidelines: A complete evaluation includes B-mode imaging, spectral Doppler, color Doppler, and power Doppler as needed of all accessible portions of each vessel. Bilateral testing is considered an integral part of a complete examination. Limited examinations for reoccurring indications may be performed as noted.  +---------+---------------+---------+-----------+----------+--------------+ RIGHT    CompressibilityPhasicitySpontaneityPropertiesThrombus Aging +---------+---------------+---------+-----------+----------+--------------+ CFV      Full           Yes      Yes                                 +---------+---------------+---------+-----------+----------+--------------+ SFJ      Full                                                        +---------+---------------+---------+-----------+----------+--------------+ FV Prox  Full                                                        +---------+---------------+---------+-----------+----------+--------------+ FV Mid   Full                                                        +---------+---------------+---------+-----------+----------+--------------+ FV DistalFull                                                        +---------+---------------+---------+-----------+----------+--------------+ PFV       Full                                                        +---------+---------------+---------+-----------+----------+--------------+ POP      Full           Yes      Yes                                 +---------+---------------+---------+-----------+----------+--------------+ PTV      Full                                                        +---------+---------------+---------+-----------+----------+--------------+  PERO     Full                                                        +---------+---------------+---------+-----------+----------+--------------+   +---------+---------------+---------+-----------+----------+--------------+ LEFT     CompressibilityPhasicitySpontaneityPropertiesThrombus Aging +---------+---------------+---------+-----------+----------+--------------+ CFV      Full           Yes      Yes                                 +---------+---------------+---------+-----------+----------+--------------+ SFJ      Full                                                        +---------+---------------+---------+-----------+----------+--------------+ FV Prox  Full                                                        +---------+---------------+---------+-----------+----------+--------------+ FV Mid   Full                                                        +---------+---------------+---------+-----------+----------+--------------+ FV DistalFull                                                        +---------+---------------+---------+-----------+----------+--------------+ PFV      Full                                                        +---------+---------------+---------+-----------+----------+--------------+ POP      Full           Yes      Yes                                 +---------+---------------+---------+-----------+----------+--------------+ PTV      Full                                                         +---------+---------------+---------+-----------+----------+--------------+ PERO     Full                                                        +---------+---------------+---------+-----------+----------+--------------+  Summary: Right: There is no evidence of deep vein thrombosis in the lower extremity. No cystic structure found in the popliteal fossa. Left: There is no evidence of deep vein thrombosis in the lower extremity. No cystic structure found in the popliteal fossa.  *See table(s) above for measurements and observations.    Preliminary    Recent Labs    12/16/18 0449 12/17/18 0533  WBC 5.9 4.0  HGB 8.5* 8.2*  HCT 24.6* 24.9*  PLT 117* 131*   Recent Labs    12/16/18 0449 12/17/18 0533  NA 139 137  K 3.9 4.0  CL 104 102  CO2 25 25  GLUCOSE 111* 107*  BUN 10 13  CREATININE 0.76 0.70  CALCIUM 8.1* 8.2*    Intake/Output Summary (Last 24 hours) at 12/18/2018 0929 Last data filed at 12/18/2018 0829 Gross per 24 hour  Intake 200 ml  Output 500 ml  Net -300 ml     Physical Exam: Vital Signs Blood pressure (!) 179/81, pulse 83, temperature 97.9 F (36.6 C), resp. rate 19, height 5\' 1"  (1.549 m), weight 63.8 kg, SpO2 96 %. Constitutional: No distress . Vital signs reviewed. HEENT: EOMI, oral membranes moist Neck: supple Cardiovascular: RRR without murmur. No JVD    Respiratory: CTA Bilaterally without wheezes or rales. Normal effort    GI: BS +, non-tender, non-distended  Musculoskeletal:     Comments: LLE edema and tenderness Right shoulder and peri-sternal tenderness.  Neurological:axo x 3. Motor: Right upper extremity: 4/5 proximal distal  With some pain inhibition. Left upper extremity: 5/5 proximal distal Right lower extremity: 4-4+/5 hip flexion, knee extension, 5/5 ankle dorsiflexion Left lower extremity: Hip flexion 4 -/5, distally limited by dressing, can wiggles toes Sensation intact to light touch  Skin:  Left lower extremity  wounds with foam dressing. Lateral incision with substantial dry blood. Both incisions intact with staples. Scattered abrasions in UE's multiple bruises and hematomas in pelvic area.  Psychiatric: pleasant and cooperative    Assessment/Plan: 1. Functional deficits secondary to polytrauma which require 3+ hours per day of interdisciplinary therapy in a comprehensive inpatient rehab setting.  Physiatrist is providing close team supervision and 24 hour management of active medical problems listed below.  Physiatrist and rehab team continue to assess barriers to discharge/monitor patient progress toward functional and medical goals  Care Tool:  Bathing    Body parts bathed by patient: Right arm, Left arm, Chest, Abdomen, Front perineal area, Right upper leg, Left upper leg, Face   Body parts bathed by helper: Buttocks, Right lower leg, Left lower leg     Bathing assist Assist Level: Moderate Assistance - Patient 50 - 74%     Upper Body Dressing/Undressing Upper body dressing   What is the patient wearing?: Pull over shirt    Upper body assist Assist Level: Minimal Assistance - Patient > 75%    Lower Body Dressing/Undressing Lower body dressing      What is the patient wearing?: Pants, Incontinence brief     Lower body assist Assist for lower body dressing: Maximal Assistance - Patient 25 - 49%     Toileting Toileting    Toileting assist Assist for toileting: Minimal Assistance - Patient > 75%     Transfers Chair/bed transfer  Transfers assist  Chair/bed transfer activity did not occur: Safety/medical concerns  Chair/bed transfer assist level: Minimal Assistance - Patient > 75%     Locomotion Ambulation   Ambulation assist      Assist level: Moderate Assistance -  Patient 50 - 74% Assistive device: Walker-rolling Max distance: 31ft   Walk 10 feet activity   Assist  Walk 10 feet activity did not occur: Safety/medical concerns(Fatigue, weakness,  nausea)        Walk 50 feet activity   Assist Walk 50 feet with 2 turns activity did not occur: Safety/medical concerns(fatigue, weakness, nausea)         Walk 150 feet activity   Assist Walk 150 feet activity did not occur: Safety/medical concerns(fatigue, weakness, nausea)         Walk 10 feet on uneven surface  activity   Assist Walk 10 feet on uneven surfaces activity did not occur: Safety/medical concerns(fatigue, weakness, nausea)         Wheelchair     Assist Will patient use wheelchair at discharge?: Yes Type of Wheelchair: Manual    Wheelchair assist level: Supervision/Verbal cueing Max wheelchair distance: 25ft    Wheelchair 50 feet with 2 turns activity    Assist    Wheelchair 50 feet with 2 turns activity did not occur: Safety/medical concerns(fatigue, nausea, SOB)       Wheelchair 150 feet activity     Assist  Wheelchair 150 feet activity did not occur: Safety/medical concerns(fatigue, nausea, SOB)       Blood pressure (!) 179/81, pulse 83, temperature 97.9 F (36.6 C), resp. rate 19, height 5\' 1"  (1.549 m), weight 63.8 kg, SpO2 96 %.  Medical Problem List and Plan: 1.  Decreased functional mobility secondary to left trimalleolar ankle fracture status post ORIF and closure of left knee laceration 12/12/2018     Sternal fracture, multiple rib fractures, right breast hematoma, abdominal wall hematoma L1, L2 and L4 vertebral body fractures with L1 superior endplate compression fracture-soft backpack TLSO brace   -TDWB LLE  -continue CIR therapies including PT and OT              2.  Antithrombotics: -DVT/anticoagulation: Lovenox.  vascular study negative             -antiplatelet therapy: Aspirin 81 mg daily 3. Pain Management: Lidoderm patch, Ultram 50 mg every 6 hours, oxycodone as needed             Monitor with increased activity.  -may use lidoderm patch for chest wall pain 4. Mood: Wellbutrin 150 mg daily              -antipsychotic agents: N/A 5. Neuropsych: This patient is capable of making decisions on her own behalf. 6. Skin/Wound Care: foam dressings to ankle wounds. 7. Fluids/Electrolytes/Nutrition: encourage PO, appetite not great  -continue protein supps for low albumin 8.  Acute blood loss anemia. hgb 8.2 -essentially stable  -follow up monday  9.  Hypothyroidism.  Cont Synthroid.  10.  Lymphoma.  Chemotherapy recently completed. 11.  Drug induced constipation.  Laxative assistance, continue senokot-s bid  -had large bm 10/14 12. 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 13. Urinary frequency:    -denies baseline issues  -some improvement  -ua negative, ucx pending 14. HTN:  -on norvasc at home  -resume today 5mg  daily LOS: 2 days A FACE TO FACE EVALUATION WAS PERFORMED  Meredith Staggers 12/18/2018, 9:29 AM

## 2018-12-18 NOTE — Progress Notes (Signed)
Occupational Therapy Session Note  Patient Details  Name: Amanda [name] MRN: [id number] Date of Birth: 1941/02/22  Today's Date: 12/18/2018 OT Individual Time: 1000-1030 OT Individual Time Calculation (min): 30 min    Short Term Goals: Week 1:  OT Short Term Goal 1 (Week 1): Pt will transfer to/ from BSC/ toilet with supervision OT Short Term Goal 2 (Week 1): pt will perform toileting with contact guard while maintaining weight bearing precautions OT Short Term Goal 3 (Week 1): Pt will bathe in seated position with AE as needed with min A  at shower level OT Short Term Goal 4 (Week 1): Pt will don LB clothing including footware with min A  Skilled Therapeutic Interventions/Progress Updates:     Treatment session with focus on functional transfers while maintaining TDWB through LLE.  Pt received supine in bed agreeable to therapy session.  Therapist assisted with donning CAM boot in supine and TLSO while seated EOB.  Pt completed sit > stand with CGA with RW and transferred to recliner with RW with CGA.  As pt attempting to get comfortable, she reports need to toilet.  Completed stand pivot with RW with CGA to [facility].  Pt able to doff pants prior to toileting and completed hygiene with setup.  Pt did require assistance when pulling pants back over hips.  Returned to recliner with CGA with RW and left semi-reclined with all needs in reach.  Cues provided during all mobility for TDWB only.  Therapy Documentation Precautions:  Precautions Precautions: Other (comment) Precaution Comments: TLSO when OOB,  TWB on LLE for transfers only Required Braces or Orthoses: Splint/Cast, Other Brace Splint/Cast: CAM boot Other Brace: TLSO when OOB Restrictions Weight Bearing Restrictions: Yes LLE Weight Bearing: Touchdown weight bearing Other Position/Activity Restrictions: TWB on LLE for transfers only Pain: Pain Assessment Pain Scale: 0-10 Pain Score: 5  Pain Location: Back Pain Descriptors /  Indicators: Aching Patients Stated Pain Goal: 3 Pain Intervention(s): Medication (See eMAR)   Therapy/Group: Individual Therapy  Simonne Come 12/18/2018, 12:26 PM

## 2018-12-18 NOTE — IPOC Note (Signed)
Overall Plan of Care [facility]) Patient Details Name: Amanda [name] MRN: [id number] DOB: 03-28-1940  Admitting Diagnosis: Multiple trauma  Hospital Problems: Principal Problem:   Multiple trauma Active Problems:   Ankle fracture     Functional Problem List: Nursing Motor, Skin Integrity, Endurance, Pain  PT Balance, Endurance, Motor, Pain  OT Balance, Pain, Cognition, Edema, Endurance, Motor  SLP    TR         Basic ADL's: OT Bathing, Grooming, Dressing, Toileting     Advanced  ADL's: OT       Transfers: PT Bed Mobility, Bed to Chair, Teacher, early years/pre, Tub/Shower     Locomotion: PT Ambulation, Emergency planning/management officer, Stairs     Additional Impairments: OT Fuctional Use of Upper Extremity  SLP        TR      Anticipated Outcomes Item Anticipated Outcome  Self Feeding n/a  Swallowing      Basic self-care  supervision to mod I  Toileting  mod I   Bathroom Transfers mod I  Bowel/Bladder  Pt will manage bowel and bladder with min assist  Transfers  Supervision  Locomotion  Supervision  Communication     Cognition     Pain  Pt will manage pain at 3 or less on a scale of 0-10.  Safety/Judgment  Pt will remain free of falls with injury while in rehab with min assist   Therapy Plan: PT Intensity: Minimum of 1-2 x/day ,45 to 90 minutes PT Frequency: 5 out of 7 days PT Duration Estimated Length of Stay: 12-14 days OT Intensity: Minimum of 1-2 x/day, 45 to 90 minutes OT Frequency: 5 out of 7 days OT Duration/Estimated Length of Stay: 10-12 days     Due to the current state of emergency, patients may not be receiving their 3-hours of Medicare-mandated therapy.   Team Interventions: Nursing Interventions Patient/Family Education, Pain Management, Medication Management, Skin Care/Wound Management, Discharge Planning  PT interventions Ambulation/gait training, Discharge planning, Functional mobility training, Therapeutic Activities, Balance/vestibular training,  Disease management/prevention, Neuromuscular re-education, Skin care/wound management, Therapeutic Exercise, Wheelchair propulsion/positioning, DME/adaptive equipment instruction, Pain management, UE/LE Strength taining/ROM, Community reintegration, Barrister's clerk education, IT trainer, UE/LE Coordination activities  OT Interventions Training and development officer, Discharge planning, Pain management, Self Care/advanced ADL retraining, Therapeutic Activities, UE/LE Coordination activities, Cognitive remediation/compensation, Disease mangement/prevention, Functional mobility training, Patient/family education, Skin care/wound managment, Therapeutic Exercise, Community reintegration, Engineer, drilling, Neuromuscular re-education, Psychosocial support, Splinting/orthotics, UE/LE Strength taining/ROM, Wheelchair propulsion/positioning  SLP Interventions    TR Interventions    SW/CM Interventions Discharge Planning, Psychosocial Support, Patient/Family Education   Barriers to Discharge MD  Medical stability  Nursing      PT      OT      SLP      SW       Team Discharge Planning: Destination: PT-Home ,OT- Home , SLP-  Projected Follow-up: PT-Home health PT, OT-  Home health OT, SLP-  Projected Equipment Needs: PT-To be determined, OT- To be determined, SLP-  Equipment Details: PT- , OT-  Patient/family involved in discharge planning: PT- Patient,  OT-Patient, SLP-   MD ELOS: 12-13 days Medical Rehab Prognosis:  Excellent Assessment: The patient has been admitted for CIR therapies with the diagnosis of polytrauma including left ankle fx. The team will be addressing functional mobility, strength, stamina, balance, safety, adaptive techniques and equipment, self-care, bowel and bladder mgt, patient and caregiver education, ortho precautions, pain mgt, community reentry. Goals have been set at supervision to  min assist with self-care, supervision for transfers and locomotion.    Due to the current state of emergency, patients may not be receiving their 3 hours per day of Medicare-mandated therapy.    Meredith Staggers, MD, FAAPMR      See Team Conference Notes for weekly updates to the plan of care

## 2018-12-19 ENCOUNTER — Inpatient Hospital Stay (HOSPITAL_COMMUNITY): Payer: 59 | Admitting: Occupational Therapy

## 2018-12-19 ENCOUNTER — Inpatient Hospital Stay (HOSPITAL_COMMUNITY): Payer: 59

## 2018-12-19 ENCOUNTER — Encounter (HOSPITAL_COMMUNITY): Payer: Self-pay | Admitting: Physical Medicine & Rehabilitation

## 2018-12-19 ENCOUNTER — Inpatient Hospital Stay (HOSPITAL_COMMUNITY): Payer: 59 | Admitting: Speech Pathology

## 2018-12-19 DIAGNOSIS — R42 Dizziness and giddiness: Secondary | ICD-10-CM

## 2018-12-19 DIAGNOSIS — G8918 Other acute postprocedural pain: Secondary | ICD-10-CM

## 2018-12-19 DIAGNOSIS — K5903 Drug induced constipation: Secondary | ICD-10-CM

## 2018-12-19 DIAGNOSIS — Z9981 Dependence on supplemental oxygen: Secondary | ICD-10-CM

## 2018-12-19 DIAGNOSIS — D696 Thrombocytopenia, unspecified: Secondary | ICD-10-CM

## 2018-12-19 DIAGNOSIS — R0989 Other specified symptoms and signs involving the circulatory and respiratory systems: Secondary | ICD-10-CM

## 2018-12-19 DIAGNOSIS — I10 Essential (primary) hypertension: Secondary | ICD-10-CM

## 2018-12-19 NOTE — Progress Notes (Addendum)
[place] PHYSICAL MEDICINE & REHABILITATION PROGRESS NOTE   Subjective/Complaints: Patient seen sitting up at the edge of her bed working with therapy this morning.  She states she slept well overnight.  She is concerned about areas of bruising.  Discussed weaning supplemental oxygen with therapies.  Discussed what appears to be vertigo with therapies.  ROS: + Nausea. Denies CP, SOB, N/V/D  Objective:   Vas [place] Lower Extremity Venous (dvt)  Result Date: 12/19/2018  Lower Venous Study Indications: Swelling.  Risk Factors: None identified. Comparison Study: No prior studies. Performing Technologist: Oliver Hum RVT  Examination Guidelines: A complete evaluation includes B-mode imaging, spectral Doppler, color Doppler, and power Doppler as needed of all accessible portions of each vessel. Bilateral testing is considered an integral part of a complete examination. Limited examinations for reoccurring indications may be performed as noted.  +---------+---------------+---------+-----------+----------+--------------+ RIGHT    CompressibilityPhasicitySpontaneityPropertiesThrombus Aging +---------+---------------+---------+-----------+----------+--------------+ CFV      Full           Yes      Yes                                 +---------+---------------+---------+-----------+----------+--------------+ SFJ      Full                                                        +---------+---------------+---------+-----------+----------+--------------+ FV Prox  Full                                                        +---------+---------------+---------+-----------+----------+--------------+ FV Mid   Full                                                        +---------+---------------+---------+-----------+----------+--------------+ FV DistalFull                                                         +---------+---------------+---------+-----------+----------+--------------+ PFV      Full                                                        +---------+---------------+---------+-----------+----------+--------------+ POP      Full           Yes      Yes                                 +---------+---------------+---------+-----------+----------+--------------+ PTV      Full                                                        +---------+---------------+---------+-----------+----------+--------------+  PERO     Full                                                        +---------+---------------+---------+-----------+----------+--------------+   +---------+---------------+---------+-----------+----------+--------------+ LEFT     CompressibilityPhasicitySpontaneityPropertiesThrombus Aging +---------+---------------+---------+-----------+----------+--------------+ CFV      Full           Yes      Yes                                 +---------+---------------+---------+-----------+----------+--------------+ SFJ      Full                                                        +---------+---------------+---------+-----------+----------+--------------+ FV Prox  Full                                                        +---------+---------------+---------+-----------+----------+--------------+ FV Mid   Full                                                        +---------+---------------+---------+-----------+----------+--------------+ FV DistalFull                                                        +---------+---------------+---------+-----------+----------+--------------+ PFV      Full                                                        +---------+---------------+---------+-----------+----------+--------------+ POP      Full           Yes      Yes                                  +---------+---------------+---------+-----------+----------+--------------+ PTV      Full                                                        +---------+---------------+---------+-----------+----------+--------------+ PERO     Full                                                        +---------+---------------+---------+-----------+----------+--------------+  Summary: Right: There is no evidence of deep vein thrombosis in the lower extremity. No cystic structure found in the popliteal fossa. Left: There is no evidence of deep vein thrombosis in the lower extremity. No cystic structure found in the popliteal fossa.  *See table(s) above for measurements and observations. Electronically signed by Harold Barban MD on 12/19/2018 at 12:44:53 AM.    Final    Recent Labs    12/17/18 0533  WBC 4.0  HGB 8.2*  HCT 24.9*  PLT 131*   Recent Labs    12/17/18 0533  NA 137  K 4.0  CL 102  CO2 25  GLUCOSE 107*  BUN 13  CREATININE 0.70  CALCIUM 8.2*    Intake/Output Summary (Last 24 hours) at 12/19/2018 1225 Last data filed at 12/19/2018 0955 Gross per 24 hour  Intake 1147 ml  Output -  Net 1147 ml     Physical Exam: Vital Signs Blood pressure (!) 170/74, pulse 69, temperature 97.9 F (36.6 C), resp. rate 19, height 5\' 1"  (1.549 m), weight 63.8 kg, SpO2 92 %. Constitutional: No distress . Vital signs reviewed. HENT: Normocephalic.  Atraumatic. Eyes: EOMI. No discharge. Cardiovascular: No JVD. Respiratory: Normal effort.  No stridor.  + [place]. GI: Non-distended. Skin: Scattered abrasions. Scattered areas of bruising Psych: Normal mood.  Normal behavior. Musc: Lower extremity edema.  Bilateral lower extremity tenderness Right shoulder tenderness Neurological: Alert Motor: RUE: 4/5 proximal distal with some pain inhibition Left upper extremity: 5/5 proximal distal Right lower extremity: 4/5 proximal distal (some pain inhibition) Left lower extremity: 4 -/5 hip flexion,  distally limited due to boot, wiggles toes Sensation intact light touch   Assessment/Plan: 1. Functional deficits secondary to polytrauma which require 3+ hours per day of interdisciplinary therapy in a comprehensive inpatient rehab setting.  Physiatrist is providing close team supervision and 24 hour management of active medical problems listed below.  Physiatrist and rehab team continue to assess barriers to discharge/monitor patient progress toward functional and medical goals  Care Tool:  Bathing    Body parts bathed by patient: Right arm, Left arm, Chest, Abdomen, Right upper leg, Left upper leg, Face   Body parts bathed by helper: Buttocks, Front perineal area     Bathing assist Assist Level: Moderate Assistance - Patient 50 - 74%     Upper Body Dressing/Undressing Upper body dressing   What is the patient wearing?: Pull over shirt    Upper body assist Assist Level: Supervision/Verbal cueing    Lower Body Dressing/Undressing Lower body dressing      What is the patient wearing?: Pants     Lower body assist Assist for lower body dressing: Maximal Assistance - Patient 25 - 49%     Toileting Toileting    Toileting assist Assist for toileting: Minimal Assistance - Patient > 75%     Transfers Chair/bed transfer  Transfers assist  Chair/bed transfer activity did not occur: Safety/medical concerns  Chair/bed transfer assist level: Minimal Assistance - Patient > 75%     Locomotion Ambulation   Ambulation assist      Assist level: Moderate Assistance - Patient 50 - 74% Assistive device: Walker-rolling Max distance: 29ft   Walk 10 feet activity   Assist  Walk 10 feet activity did not occur: Safety/medical concerns(Fatigue, weakness, nausea)        Walk 50 feet activity   Assist Walk 50 feet with 2 turns activity did not occur: Safety/medical concerns(fatigue, weakness, nausea)  Walk 150 feet activity   Assist Walk 150 feet  activity did not occur: Safety/medical concerns(fatigue, weakness, nausea)         Walk 10 feet on uneven surface  activity   Assist Walk 10 feet on uneven surfaces activity did not occur: Safety/medical concerns(fatigue, weakness, nausea)         Wheelchair     Assist Will patient use wheelchair at discharge?: Yes Type of Wheelchair: Manual    Wheelchair assist level: Supervision/Verbal cueing Max wheelchair distance: 55ft    Wheelchair 50 feet with 2 turns activity    Assist    Wheelchair 50 feet with 2 turns activity did not occur: Safety/medical concerns(fatigue, nausea, SOB)       Wheelchair 150 feet activity     Assist  Wheelchair 150 feet activity did not occur: Safety/medical concerns(fatigue, nausea, SOB)       Blood pressure (!) 170/74, pulse 69, temperature 97.9 F (36.6 C), resp. rate 19, height 5\' 1"  (1.549 m), weight 63.8 kg, SpO2 92 %.  Medical Problem List and Plan: 1.  Decreased functional mobility secondary to left trimalleolar ankle fracture status post ORIF and closure of left knee laceration 12/12/2018     Sternal fracture, multiple rib fractures, right breast hematoma, abdominal wall hematoma L1, L2 and L4 vertebral body fractures with L1 superior endplate compression fracture-soft backpack TLSO brace   -TDWB LLE  Continue CIR              Vestibular evaluation on Monday 2.  Antithrombotics: -DVT/anticoagulation: Lovenox.  vascular study negative             -antiplatelet therapy: Aspirin 81 mg daily 3. Pain Management: Lidoderm patch, Ultram 50 mg every 6 hours, oxycodone as needed             Monitor with increased activity.  -may use lidoderm patch for chest wall pain  Tolerable on 10/17 4. Mood: Wellbutrin 150 mg daily             -antipsychotic agents: N/A 5. Neuropsych: This patient is capable of making decisions on her own behalf. 6. Skin/Wound Care: foam dressings to ankle wounds. 7. Fluids/Electrolytes/Nutrition:  encourage PO, appetite not great  -continue protein supps for low albumin 8.  Acute blood loss anemia.   Hemoglobin 8.2 on 10/15  Labs ordered for Monday 9.  Hypothyroidism.  Cont Synthroid.  10.  Lymphoma.  Chemotherapy recently completed. 11.  Drug induced constipation.  Laxative assistance, continue senokot-s bid  Improving 12. 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 13. Urinary frequency:    -denies baseline issues  -some improvement  -ua negative, ucx relatively unremarkable 14. HTN:  Norvasc today 5mg  daily  Labile on 10/17  15.  Thrombocytopenia  Platelets 131 on 10/15  Labs ordered for Monday  LOS: 3 days A FACE TO FACE EVALUATION WAS PERFORMED  Ankit Lorie Phenix 12/19/2018, 12:25 PM

## 2018-12-19 NOTE — Progress Notes (Signed)
Physical Therapy Session Note  Patient Details  Name: Amanda [name] MRN: [id number] Date of Birth: Dec 04, 1940  Today's Date: 12/19/2018 PT Individual Time: 1045-1200 PT Individual Time Calculation (min): 75 min   Short Term Goals: Week 1:  PT Short Term Goal 1 (Week 1): Pt will perfrom bed mobility min A PT Short Term Goal 2 (Week 1): Pt will transfer with RW min A PT Short Term Goal 3 (Week 1): Pt will ambulate 46ft with RW min A  Skilled Therapeutic Interventions/Progress Updates:   Pt resting in bed.  She stated that she was exhausted from her shower this AM.  She rated pain 7/10 sternum, premedicated.   In supine with HOB at 15 degrees, Therapeutic exercise performed with LEs and UEs to increase strength for functional mobility: 10 x 1 R/L short arc quad knee extension, R/L straight leg raises, R/L hip abduction- small excursions, R/L scapular protraction with extended elbows,  bil elbow extension,  10 x 2 bil ankle pumps, R/L hip/knee flexion- small excursion L..  Using 2# weighted bar and beach ball, with HOB raised, volleying ball back and forth x 10, x 10, x 20.  Pt declined getting OOB or attempting side lying for exs.  PT educated her about the risks of bedrest, etc.  Pt used incentive inspirometer x 10 and expiratory flutter valve x 10.  PT reviewed recommendations for use Q hour, and urged pt to use both several times this afternoon.  Pt voiced understanding.  At end of session, pt resting in bed with needs at hand and alarm set.      Therapy Documentation Precautions:  Precautions Precautions: Other (comment) Precaution Comments: TLSO when OOB,  TWB on LLE for transfers only Required Braces or Orthoses: Splint/Cast, Other Brace Splint/Cast: CAM boot Other Brace: TLSO when OOB Restrictions Weight Bearing Restrictions: Yes LLE Weight Bearing: Touchdown weight bearing Other Position/Activity Restrictions: TWB on LLE for transfers only          Therapy/Group:  Individual Therapy  Timira Bieda 12/19/2018, 12:09 PM

## 2018-12-19 NOTE — Progress Notes (Signed)
Occupational Therapy Session Note  Patient Details  Name: Amanda [name] MRN: [id number] Date of Birth: 1940-10-30  Today's Date: 12/19/2018 OT Individual Time: 0932-6712 OT Individual Time Calculation (min): 26 min   Short Term Goals: Week 1:  OT Short Term Goal 1 (Week 1): Pt will transfer to/ from BSC/ toilet with supervision OT Short Term Goal 2 (Week 1): pt will perform toileting with contact guard while maintaining weight bearing precautions OT Short Term Goal 3 (Week 1): Pt will bathe in seated position with AE as needed with min A  at shower level OT Short Term Goal 4 (Week 1): Pt will don LB clothing including footware with min A  Skilled Therapeutic Interventions/Progress Updates:    Pt greeted in bed, premedicated for pain. ADL needs were met so our plan was to work on increasing her independence with donning TLSO and CAM boot. Supine<sit completed with supervision assist via logroll technique. Once EOB, pt required vcs for applying underarm straps. Then she stated that she needed to lie back down due to back pain and fatigue. Supervision for sit<supine. OT provided visual demonstration while explaining how to don the TLSO without breaking her back precautions. Also provided pt with an aromatherapy anti-nausea blend to use during spells of nausea. She was left in bed with all needs within reach and bed alarm set.   02 sats on 1L at start of session 99%. Pt removed [place] during session for 02 weaning. After EOB activity 02 sats were 92%, increasing to 93% while she rested for a bit in bed. She wanted to keep the  off to rest. Notified NT and RN.   Therapy Documentation Precautions:  Precautions Precautions: Other (comment) Precaution Comments: TLSO when OOB,  TWB on LLE for transfers only Required Braces or Orthoses: Splint/Cast, Other Brace Splint/Cast: CAM boot Other Brace: TLSO when OOB Restrictions Weight Bearing Restrictions: Yes LLE Weight Bearing: Touchdown weight  bearing Other Position/Activity Restrictions: TWB on LLE for transfers only Vital Signs:  Oxygen Therapy SpO2: 93 % O2 Device: Room Air ADL: ADL Grooming: Supervision/safety Upper Body Bathing: Contact guard Where Assessed-Upper Body Bathing: Edge of bed Lower Body Bathing: Maximal assistance Upper Body Dressing: Supervision/safety Where Assessed-Upper Body Dressing: Edge of bed Lower Body Dressing: Maximal assistance Where Assessed-Lower Body Dressing: Edge of bed Toileting: Moderate assistance Where Assessed-Toileting: Bedside Commode Toilet Transfer: Moderate assistance Toilet Transfer Method: Stand pivot Social research officer, government: Not assessed      Therapy/Group: Individual Therapy  Meeka Cartelli A Tabytha Gradillas 12/19/2018, 12:59 PM

## 2018-12-19 NOTE — Progress Notes (Signed)
Occupational Therapy Session Note  Patient Details  Name: Amanda [name] MRN: [id number] Date of Birth: 1940-10-12  Today's Date: 12/19/2018 OT Individual Time: [id number] Individual Time Calculation (min): 74 min    Short Term Goals: Week 1:  OT Short Term Goal 1 (Week 1): Pt will transfer to/ from BSC/ toilet with supervision OT Short Term Goal 2 (Week 1): pt will perform toileting with contact guard while maintaining weight bearing precautions OT Short Term Goal 3 (Week 1): Pt will bathe in seated position with AE as needed with min A  at shower level OT Short Term Goal 4 (Week 1): Pt will don LB clothing including footware with min A  Skilled Therapeutic Interventions/Progress Updates:    1:1. Pt received in bed with breakfast and 8/10 pain after shower. RN delivers pain medication and zofran. Discussed with MD about suspected vestibular issues. Edu re gaze stabilization during transfers with reported improvement. Pt completes bathing after OT applies occlusives to surgical sites, ports and IV/dressing on hands. Pt bathes seated with A to wash buttocks and VC for LHSS to wash feet.Pt dons shirt with set up and MAX A for orthosis. Pt dons pants in seated figure 4 to thread BLE and sit to stand  With MIN A at sink and OT advances pants past hipsand CAM boot donned after total A for donning. CAM boot and TLSO on for all transfers using RW and MIN A- CGA with VC for WB precautions. Pt grooms at sink with set up. Exited session with pt setaed in bed, exit alamr on and call light in reach  Therapy Documentation Precautions:  Precautions Precautions: Other (comment) Precaution Comments: TLSO when OOB,  TWB on LLE for transfers only Required Braces or Orthoses: Splint/Cast, Other Brace Splint/Cast: CAM boot Other Brace: TLSO when OOB Restrictions Weight Bearing Restrictions: Yes LLE Weight Bearing: Touchdown weight bearing Other Position/Activity Restrictions: TWB on LLE for transfers  only General:   Vital Signs:   Pain:   ADL: ADL Grooming: Supervision/safety Upper Body Bathing: Contact guard Where Assessed-Upper Body Bathing: Edge of bed Lower Body Bathing: Maximal assistance Upper Body Dressing: Supervision/safety Where Assessed-Upper Body Dressing: Edge of bed Lower Body Dressing: Maximal assistance Where Assessed-Lower Body Dressing: Edge of bed Toileting: Moderate assistance Where Assessed-Toileting: Bedside Commode Toilet Transfer: Moderate assistance Toilet Transfer Method: Stand pivot Social research officer, government: Not assessed Vision   Perception    Praxis   Exercises:   Other Treatments:     Therapy/Group: Individual Therapy  Tonny Branch 12/19/2018, 8:00 AM

## 2018-12-20 ENCOUNTER — Inpatient Hospital Stay (HOSPITAL_COMMUNITY): Payer: 59

## 2018-12-20 NOTE — Progress Notes (Signed)
Occupational Therapy Session Note  Patient Details  Name: Amanda [name] MRN: [id number] Date of Birth: Jul 20, 1940  Today's Date: 12/20/2018 OT Individual Time: [id number] OT Individual Time Calculation (min): 57 min    Short Term Goals: Week 1:  OT Short Term Goal 1 (Week 1): Pt will transfer to/ from BSC/ toilet with supervision OT Short Term Goal 2 (Week 1): pt will perform toileting with contact guard while maintaining weight bearing precautions OT Short Term Goal 3 (Week 1): Pt will bathe in seated position with AE as needed with min A  at shower level OT Short Term Goal 4 (Week 1): Pt will don LB clothing including footware with min A  Skilled Therapeutic Interventions/Progress Updates:    Pt received supine in bed with no c/o pain. Pt's husband entered room shortly after therapist and was present for session to discuss home transfers and potential equipment. Pt completed bed mobility to EOB with (S). Instruction provided to pt's husband re donning cam boot and TLSO, which pt was able to do with min A. Pt used RW to complete stand pivot transfer to w/c with CGA. Pt was taken down to the ADL apt in her w/c. Rest of session focused on bed and tub transfer. Pt has a high bed which she usually accesses via a step stool. Discussed safety concerns with this method and presented alternative options with demo re use. Pt returned demo of using a ~4 in platform step. Pt returned demo with mod A and had very poor adherence to TDWB LLE precautions. Discussed other alternatives including lowering bed if pt is unable to maintain precautions. Pt then given demo re TTB use and pt able to return demo safely with CGA. Both pt and her husband felt safe with the TTB option and we discussed use of a BSC as well. Pt was returned to her room and she returned to bed. Bed alarm set, all needs within reach.   Therapy Documentation Precautions:  Precautions Precautions: Other (comment) Precaution Comments: TLSO when  OOB,  TWB on LLE for transfers only Required Braces or Orthoses: Splint/Cast, Other Brace Splint/Cast: CAM boot Other Brace: TLSO when OOB Restrictions Weight Bearing Restrictions: Yes LLE Weight Bearing: Touchdown weight bearing Other Position/Activity Restrictions: TWB on LLE for transfers only   Therapy/Group: Individual Therapy  Curtis Sites 12/20/2018, 7:27 AM

## 2018-12-20 NOTE — Progress Notes (Signed)
[facility] PHYSICAL MEDICINE & REHABILITATION PROGRESS NOTE   Subjective/Complaints: Patient seen laying in bed this morning.  She states that she slept well overnight.  She notes that she did not require supplemental oxygen yesterday.  Nursing notes reviewed noting desaturations, patient asymptomatic and not wanting supplemental oxygen.  We will need to continue to monitor.  She also notes that she gets nauseous when she takes medications on an empty stomach and after excessive exertion.  She notes she had a tiring day of therapies yesterday.  ROS: Denies CP, SOB, N/V/D  Objective:   No results found. No results for input(s): WBC, HGB, HCT, PLT in the last 72 hours. No results for input(s): NA, K, CL, CO2, GLUCOSE, BUN, CREATININE, CALCIUM in the last 72 hours.  Intake/Output Summary (Last 24 hours) at 12/20/2018 0935 Last data filed at 12/20/2018 0645 Gross per 24 hour  Intake 1311 ml  Output -  Net 1311 ml     Physical Exam: Vital Signs Blood pressure (!) 150/76, pulse 73, temperature 97.8 F (36.6 C), resp. rate 18, height 5\' 1"  (1.549 m), weight 63.8 kg, SpO2 94 %. Constitutional: No distress . Vital signs reviewed. HENT: Normocephalic.  Atraumatic. Eyes: EOMI. No discharge. Cardiovascular: No JVD. Respiratory: Normal effort.  No stridor. GI: Non-distended. Skin: Scattered abrasions Scattered bruising Psych: Normal mood.  Normal behavior. Musc: Bilateral lower extremity edema and tenderness Right shoulder tenderness Neurological: Alert Motor: RUE: 4/5 proximal distal with some pain inhibition, unchanged Right lower extremity: 4/5 proximal distal (some pain inhibition) Left lower extremity: 4/5 hip flexion, 4+/5 knee extension, wiggles toes  Assessment/Plan: 1. Functional deficits secondary to polytrauma which require 3+ hours per day of interdisciplinary therapy in a comprehensive inpatient rehab setting.  Physiatrist is providing close team supervision and 24 hour  management of active medical problems listed below.  Physiatrist and rehab team continue to assess barriers to discharge/monitor patient progress toward functional and medical goals  Care Tool:  Bathing    Body parts bathed by patient: Right arm, Left arm, Chest, Abdomen, Right upper leg, Left upper leg, Face   Body parts bathed by helper: Buttocks, Front perineal area     Bathing assist Assist Level: Moderate Assistance - Patient 50 - 74%     Upper Body Dressing/Undressing Upper body dressing   What is the patient wearing?: Pull over shirt    Upper body assist Assist Level: Supervision/Verbal cueing    Lower Body Dressing/Undressing Lower body dressing      What is the patient wearing?: Pants     Lower body assist Assist for lower body dressing: Maximal Assistance - Patient 25 - 49%     Toileting Toileting    Toileting assist Assist for toileting: Minimal Assistance - Patient > 75%     Transfers Chair/bed transfer  Transfers assist  Chair/bed transfer activity did not occur: Safety/medical concerns  Chair/bed transfer assist level: Minimal Assistance - Patient > 75%     Locomotion Ambulation   Ambulation assist      Assist level: Moderate Assistance - Patient 50 - 74% Assistive device: Walker-rolling Max distance: 52ft   Walk 10 feet activity   Assist  Walk 10 feet activity did not occur: Safety/medical concerns(Fatigue, weakness, nausea)        Walk 50 feet activity   Assist Walk 50 feet with 2 turns activity did not occur: Safety/medical concerns(fatigue, weakness, nausea)         Walk 150 feet activity   Assist Walk 150  feet activity did not occur: Safety/medical concerns(fatigue, weakness, nausea)         Walk 10 feet on uneven surface  activity   Assist Walk 10 feet on uneven surfaces activity did not occur: Safety/medical concerns(fatigue, weakness, nausea)         Wheelchair     Assist Will patient use  wheelchair at discharge?: Yes Type of Wheelchair: Manual    Wheelchair assist level: Supervision/Verbal cueing Max wheelchair distance: 73ft    Wheelchair 50 feet with 2 turns activity    Assist    Wheelchair 50 feet with 2 turns activity did not occur: Safety/medical concerns(fatigue, nausea, SOB)       Wheelchair 150 feet activity     Assist  Wheelchair 150 feet activity did not occur: Safety/medical concerns(fatigue, nausea, SOB)       Blood pressure (!) 150/76, pulse 73, temperature 97.8 F (36.6 C), resp. rate 18, height 5\' 1"  (1.549 m), weight 63.8 kg, SpO2 94 %.  Medical Problem List and Plan: 1.  Decreased functional mobility secondary to left trimalleolar ankle fracture status post ORIF and closure of left knee laceration 12/12/2018     Sternal fracture, multiple rib fractures, right breast hematoma, abdominal wall hematoma L1, L2 and L4 vertebral body fractures with L1 superior endplate compression fracture-soft backpack TLSO brace   -TDWB LLE  Continue CIR              Plan for vestibular evaluation tomorrow, however may no longer be necessary 2.  Antithrombotics: -DVT/anticoagulation: Lovenox.  vascular study negative             -antiplatelet therapy: Aspirin 81 mg daily 3. Pain Management: Lidoderm patch, Ultram 50 mg every 6 hours, oxycodone as needed             Monitor with increased activity.  -may use lidoderm patch for chest wall pain  Relatively controlled on 10/18 4. Mood: Wellbutrin 150 mg daily             -antipsychotic agents: N/A 5. Neuropsych: This patient is capable of making decisions on her own behalf. 6. Skin/Wound Care: foam dressings to ankle wounds. 7. Fluids/Electrolytes/Nutrition: encourage PO, appetite not great  -continue protein supps for low albumin 8.  Acute blood loss anemia.   Hemoglobin 8.2 on 10/15  Labs ordered for tomorrow 9.  Hypothyroidism.  Cont Synthroid.  10.  Lymphoma.  Chemotherapy recently completed. 11.   Drug induced constipation.  Laxative assistance, continue senokot-s bid  Improving 12. 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 13. Urinary frequency:    -denies baseline issues  -some improvement  -ua negative, ucx relatively unremarkable 14. HTN:  Norvasc 5mg  daily  ?  Received 2 doses on 10/16  Remains elevated, will consider further increase if persistent 15.  Thrombocytopenia  Platelets 131 on 10/15  Labs ordered for tomorrow  LOS: 4 days A FACE TO FACE EVALUATION WAS PERFORMED  Ankit Lorie Phenix 12/20/2018, 9:35 AM

## 2018-12-20 NOTE — Progress Notes (Signed)
O2 sat 89% RA at 2011, rechecked at 2045 rechecked at 2045=91% RA. At 0327, O2 sat 91% RA. Patient encouraged about being off O2. Getting up to [facility] with min.to steadying assistance. Needs assistance with manipulating clothes. Seems to get nauseated with movements. Porta cath not accessed. LLE edema. Left foot/ankle with surgical dressings, C D & I. Right hand dressing C D & I. Pain managed with PRN tylenol and  scheduled ultram. Not taking Oxy IR.  Requesting therapies be spread out during day. Patrici Ranks A

## 2018-12-21 ENCOUNTER — Inpatient Hospital Stay (HOSPITAL_COMMUNITY): Payer: 59 | Admitting: Occupational Therapy

## 2018-12-21 ENCOUNTER — Inpatient Hospital Stay (HOSPITAL_COMMUNITY): Payer: 59

## 2018-12-21 ENCOUNTER — Inpatient Hospital Stay (HOSPITAL_COMMUNITY): Payer: 59 | Admitting: Physical Therapy

## 2018-12-21 LAB — CBC
HCT: 29 % — ABNORMAL LOW (ref 36.0–46.0)
Hemoglobin: 9.5 g/dL — ABNORMAL LOW (ref 12.0–15.0)
MCH: 29.3 pg (ref 26.0–34.0)
MCHC: 32.8 g/dL (ref 30.0–36.0)
MCV: 89.5 fL (ref 80.0–100.0)
Platelets: 293 10*3/uL (ref 150–400)
RBC: 3.24 MIL/uL — ABNORMAL LOW (ref 3.87–5.11)
RDW: 15.9 % — ABNORMAL HIGH (ref 11.5–15.5)
WBC: 2.6 10*3/uL — ABNORMAL LOW (ref 4.0–10.5)
nRBC: 0 % (ref 0.0–0.2)

## 2018-12-21 NOTE — Progress Notes (Signed)
Physical Therapy Session Note  Patient Details  Name: Amanda [name] MRN: [id number] Date of Birth: 1940-06-04  Today's Date: 12/21/2018 PT Individual Time: [id number] PT Individual Time Calculation (min): 44 min   Short Term Goals: Week 1:  PT Short Term Goal 1 (Week 1): Pt will perfrom bed mobility min A PT Short Term Goal 2 (Week 1): Pt will transfer with RW min A PT Short Term Goal 3 (Week 1): Pt will ambulate 96ft with RW min A  Skilled Therapeutic Interventions/Progress Updates:  Pt received in bed & agreeable to tx. Pt reports her & her husband plan to stay with her daughter upon d/c, where she has a level entry at the back door or elevator when entering the garage. Reviewed back precautions with pt & reviewed log rolling for bed mobility with pt performing with supervision. Therapist donned L CAM boot & TLSO for time management. Pt reports need to use restroom and pt transfers bed<>w/c with RW & min assist, managed clothing with min assist for standing balance, and cuing for hand placement for sit<>stand transfers. Pt with continent void on BSC and with 1 LOB after transferring to standing but able to correct with min assist. Pt then transfers bed>w/c with RW & min assist & propels w/c with BUE & multiple bouts, with greatest distance of ~50 ft with supervision. Therapist provides pt with w/c gloves to prevent skin breakdown and educates her on w/c propulsion technique for energy efficiency. Pt completes car transfer at [place] 4 simulated height with RW & min assist with extra time to scoot buttocks back on seat & cuing for overall technique. Returned pt to room & pt transferred w/c>recliner with RW & min assist via stand pivot. Pt left in recliner with BLE elevated, chair alarm donned, & all needs in reach; educated pt to leave TLSO donned even while sitting in recliner.  Therapy Documentation Precautions:  Precautions Precautions: Fall, Back Precaution Comments: TLSO when OOB,  TWB on LLE  for transfers only Required Braces or Orthoses: Other Brace Splint/Cast: CAM boot Other Brace: TLSO when OOB Restrictions Weight Bearing Restrictions: Yes LLE Weight Bearing: Touchdown weight bearing Other Position/Activity Restrictions: TWB on LLE for transfers only  Pain: 7/10 pain in back & R Ribs - pt denies asking for pain medication, rest breaks provided PRN during session.    Therapy/Group: Individual Therapy  Waunita Schooner 12/21/2018, 3:02 PM

## 2018-12-21 NOTE — Progress Notes (Signed)
Physical Therapy Session Note  Patient Details  Name: PALIN KLAMM MRN: [id number] Date of Birth: 1940-04-18  Today's Date: 12/21/2018 PT Individual Time: 0800-0900 PT Individual Time Calculation (min): 60 min   Short Term Goals: Week 1:  PT Short Term Goal 1 (Week 1): Pt will perfrom bed mobility min A PT Short Term Goal 2 (Week 1): Pt will transfer with RW min A PT Short Term Goal 3 (Week 1): Pt will ambulate 45ft with RW min A  Skilled Therapeutic Interventions/Progress Updates:    Received pt supine in bed, pt agreeable to PT and stated pain 7/10 in shoulders and low back; received pain meds prior to session. Pt reports after last therapy session an increase in R ankle pain and minor bruising, RN and MD aware. Pt reports she continues to feel neaseous with activity and PT provided rest breaks and ginger ale throughout session. Session focused on functional mobility/transfers, UE/LE strength, balance, postural alignment, and increased tolerance to activity. Pt performed bed mobility consisting of rolling to R with supervision and R sidelying<>sitting EOB with min A for LE management and HOB slightly elevated. PT donned TLSO and L CAM boot while sitting EOB max A. Pt transferred stand pivot x 3 throughout session with RW min A while maintaining WB precautions. O2 sat 95% and HR 69bpm prior to exercises. Pt performed 1x12 seated scapular retractions without resistance and 2x12 scapular retractions with blue TB. Pt performed 2x12 seated bicep curls with 3lb weight. Pt performed bilateral ER seated with blue TB 2x12 with verbal cues to keep elbows tucked at side and to bring shoulders down and back. Pt performed 2x12 seated ball squeezes with elbows flexed to 90 degreees facilitating pec strengthening. Pt performed 2x12 seated hammer curls with 3lb weights with emphasis on eccentric control. Pt transferred stand pivot with RW to mat min A while maintaining WB precautions. In supine with wedge under  knees for back pain relief pt performed 2x12 bridges with cues for slow and controlled movement in pain free range. Pt performed 2x12 SLR on RLE and 2x6 on LLE with verbal cues for knee extension and ankle DF to target quads. Concluded session with pt sitting in [facility], needs within reach and OT Jeneen Rinks present for session.   Therapy Documentation Precautions:  Precautions Precautions: Other (comment) Precaution Comments: TLSO when OOB,  TWB on LLE for transfers only Required Braces or Orthoses: Splint/Cast, Other Brace Splint/Cast: CAM boot Other Brace: TLSO when OOB Restrictions Weight Bearing Restrictions: Yes LLE Weight Bearing: Touchdown weight bearing Other Position/Activity Restrictions: TWB on LLE for transfers only   Therapy/Group: Individual Therapy  Alfonse Alpers PT, DPT   12/21/2018, 7:38 AM

## 2018-12-21 NOTE — Progress Notes (Signed)
PRN zofran given at 2042. Pain managed with scheduled ultram and PRN tylenol. Surgical dressings in place to right foot/ankle. LLE edema. Small scab noted to right lateral ankle with bruising. Husband concerned that it's new. Amanda [name] A

## 2018-12-21 NOTE — Plan of Care (Signed)
Goals set to supervision to reflect pt goals and request for husband supervision for safety.    Problem: Sit to Stand Goal: LTG:  Patient will perform sit to stand in prep for activites of daily living with assistance level (OT) Description: LTG:  Patient will perform sit to stand in prep for activites of daily living with assistance level (OT) Flowsheets (Taken 12/21/2018 2010) LTG: PT will perform sit to stand in prep for activites of daily living with assistance level: (downgraded 10/19) Supervision/Verbal cueing   Problem: RH Toileting Goal: LTG Patient will perform toileting task (3/3 steps) with assistance level (OT) Description: LTG: Patient will perform toileting task (3/3 steps) with assistance level (OT)  Flowsheets (Taken 12/21/2018 2010) LTG: Pt will perform toileting task (3/3 steps) with assistance level: (downgraded 10/19) Supervision/Verbal cueing   Problem: RH Toilet Transfers Goal: LTG Patient will perform toilet transfers w/assist (OT) Description: LTG: Patient will perform toilet transfers with assist, with/without cues using equipment (OT) Flowsheets (Taken 12/21/2018 2010) LTG: Pt will perform toilet transfers with assistance level of: (downgraded 10/19) Supervision/Verbal cueing

## 2018-12-21 NOTE — Progress Notes (Signed)
[facility] PHYSICAL MEDICINE & REHABILITATION PROGRESS NOTE   Subjective/Complaints: Had a reasonable night. Had pain in right ankle when using RLE to hop on step with therapy.  No desats noted  ROS: Patient denies fever, rash, sore throat, blurred vision, nausea, vomiting, diarrhea, cough, shortness of breath or chest pain, joint or back pain, headache, or mood change.    Objective:   No results found. Recent Labs    12/21/18 0730  WBC 2.6*  HGB 9.5*  HCT 29.0*  PLT 293   No results for input(s): NA, K, CL, CO2, GLUCOSE, BUN, CREATININE, CALCIUM in the last 72 hours.  Intake/Output Summary (Last 24 hours) at 12/21/2018 1011 Last data filed at 12/21/2018 0836 Gross per 24 hour  Intake 1002 ml  Output -  Net 1002 ml     Physical Exam: Vital Signs Blood pressure (!) 170/77, pulse 72, temperature 98.2 F (36.8 C), resp. rate 17, height 5\' 1"  (1.549 m), weight 63.8 kg, SpO2 92 %. Constitutional: No distress . Vital signs reviewed. HEENT: EOMI, oral membranes moist Neck: supple Cardiovascular: RRR without murmur. No JVD    Respiratory: CTA Bilaterally without wheezes or rales. Normal effort    GI: BS +, non-tender, non-distended  Skin: Scattered abrasions Scattered bruising improved Psych: Normal mood.  Normal behavior. Musc: Bilateral lower extremity edema and tenderness, right ankle with inversion. No swelling or warmth, +bruising Right shoulder tenderness Neurological: Alert Motor: RUE: 4/5 proximal distal with some pain inhibition, unchanged Right lower extremity: 4/5 proximal distal (some pain inhibition) Left lower extremity: 4/5 hip flexion, 4+/5 knee extension, ADF/PF limited by ortho.  Assessment/Plan: 1. Functional deficits secondary to polytrauma which require 3+ hours per day of interdisciplinary therapy in a comprehensive inpatient rehab setting.  Physiatrist is providing close team supervision and 24 hour management of active medical problems listed  below.  Physiatrist and rehab team continue to assess barriers to discharge/monitor patient progress toward functional and medical goals  Care Tool:  Bathing    Body parts bathed by patient: Right arm, Left arm, Chest, Abdomen, Right upper leg, Left upper leg, Face   Body parts bathed by helper: Buttocks, Front perineal area     Bathing assist Assist Level: Moderate Assistance - Patient 50 - 74%     Upper Body Dressing/Undressing Upper body dressing   What is the patient wearing?: Pull over shirt    Upper body assist Assist Level: Supervision/Verbal cueing    Lower Body Dressing/Undressing Lower body dressing      What is the patient wearing?: Pants     Lower body assist Assist for lower body dressing: Maximal Assistance - Patient 25 - 49%     Toileting Toileting    Toileting assist Assist for toileting: Minimal Assistance - Patient > 75%     Transfers Chair/bed transfer  Transfers assist  Chair/bed transfer activity did not occur: Safety/medical concerns  Chair/bed transfer assist level: Minimal Assistance - Patient > 75%     Locomotion Ambulation   Ambulation assist      Assist level: Moderate Assistance - Patient 50 - 74% Assistive device: Walker-rolling Max distance: 56ft   Walk 10 feet activity   Assist  Walk 10 feet activity did not occur: Safety/medical concerns(Fatigue, weakness, nausea)        Walk 50 feet activity   Assist Walk 50 feet with 2 turns activity did not occur: Safety/medical concerns(fatigue, weakness, nausea)         Walk 150 feet activity  Assist Walk 150 feet activity did not occur: Safety/medical concerns(fatigue, weakness, nausea)         Walk 10 feet on uneven surface  activity   Assist Walk 10 feet on uneven surfaces activity did not occur: Safety/medical concerns(fatigue, weakness, nausea)         Wheelchair     Assist Will patient use wheelchair at discharge?: Yes Type of Wheelchair:  Manual    Wheelchair assist level: Supervision/Verbal cueing Max wheelchair distance: 3ft    Wheelchair 50 feet with 2 turns activity    Assist    Wheelchair 50 feet with 2 turns activity did not occur: Safety/medical concerns(fatigue, nausea, SOB)       Wheelchair 150 feet activity     Assist  Wheelchair 150 feet activity did not occur: Safety/medical concerns(fatigue, nausea, SOB)       Blood pressure (!) 170/77, pulse 72, temperature 98.2 F (36.8 C), resp. rate 17, height 5\' 1"  (1.549 m), weight 63.8 kg, SpO2 92 %.  Medical Problem List and Plan: 1.  Decreased functional mobility secondary to left trimalleolar ankle fracture status post ORIF and closure of left knee laceration 12/12/2018     Sternal fracture, multiple rib fractures, right breast hematoma, abdominal wall hematoma L1, L2 and L4 vertebral body fractures with L1 superior endplate compression fracture-soft backpack TLSO brace   -TDWB LLE  Continue CIR              Vestibular eval pending? 2.  Antithrombotics: -DVT/anticoagulation: Lovenox.  vascular study negative             -antiplatelet therapy: Aspirin 81 mg daily 3. Pain Management: Lidoderm patch, Ultram 50 mg every 6 hours, oxycodone as needed             Monitor with increased activity.  -may use lidoderm patch for chest wall pain  Relatively controlled on 10/19  -may have suffered a right ankle sprain with accident. Doesn't need to hop up into home bed yet as she will be staying with daughter on lower bed   -can splint right ankle if needed 4. Mood: Wellbutrin 150 mg daily             -antipsychotic agents: N/A 5. Neuropsych: This patient is capable of making decisions on her own behalf. 6. Skin/Wound Care: foam dressings to ankle wounds. 7. Fluids/Electrolytes/Nutrition: encourage PO, appetite not great  -continue protein supps for low albumin 8.  Acute blood loss anemia.   Hemoglobin 8.2 on 10/15--->9.5 10/19 9.  Hypothyroidism.  Cont  Synthroid.  10.  Lymphoma.  Chemotherapy recently completed. 11.  Drug induced constipation.  Laxative assistance, continue senokot-s bid  Improving 12. 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 13. Urinary frequency:    -denies baseline issues  -some improvement  -ua negative, ucx relatively unremarkable 14. HTN:  Norvasc 5mg  daily  Borderline, no changes at present 15.  Thrombocytopenia/pancytopenia  Platelets 131 on 10/15---293 today  -wbcs 2.6     LOS: 5 days A FACE TO FACE EVALUATION WAS PERFORMED  Meredith Staggers 12/21/2018, 10:11 AM

## 2018-12-21 NOTE — Progress Notes (Signed)
Occupational Therapy Session Note  Patient Details  Name: Amanda [name] MRN: [id number] Date of Birth: November 04, 1940  Today's Date: 12/21/2018 OT Individual Time: [id number] OT Individual Time Calculation (min): 43 min    Short Term Goals: Week 1:  OT Short Term Goal 1 (Week 1): Pt will transfer to/ from BSC/ toilet with supervision OT Short Term Goal 2 (Week 1): pt will perform toileting with contact guard while maintaining weight bearing precautions OT Short Term Goal 3 (Week 1): Pt will bathe in seated position with AE as needed with min A  at shower level OT Short Term Goal 4 (Week 1): Pt will don LB clothing including footware with min A  Skilled Therapeutic Interventions/Progress Updates:    Pt completed grooming tasks from the wheelchair per request with supervision to start the session.  She then completed functional transfers stand pivot to the [facility] with min assist using the RW for support.  She needed mod instructional cueing to place the LLE out in front of her and to push up from the arms of the chair and the toilet in order to maintain TDWBing.  She then completed wheelchair mobility out in the hallway with supervision and increased time.  Some reported back pain from pushing the wheelchair but able to relieve with rest break of a few minutes.  Finished session with transfer back to the bed with min assist.  She was able to remove the TLSO with supervision and min instructional cueing.  Max assist for removal of the cam boot from the left foot.  She was able to transfer to supine with min assist.  Call button and phone in reach with safety alarm in place.    Therapy Documentation Precautions:  Precautions Precautions: Fall, Back Precaution Comments: TLSO when OOB,  TWB on LLE for transfers only Required Braces or Orthoses: Other Brace Splint/Cast: CAM boot Other Brace: TLSO when OOB Restrictions Weight Bearing Restrictions: Yes LLE Weight Bearing: Touchdown weight bearing Other  Position/Activity Restrictions: TWB on LLE for transfers only  Pain: Pain Assessment Pain Scale: 0-10 Pain Score: 6  Faces Pain Scale: Hurts a little bit Pain Type: Acute pain Pain Location: Chest Pain Orientation: Mid Pain Descriptors / Indicators: Aching Pain Onset: With Activity Patients Stated Pain Goal: 3 Pain Intervention(s): Medication (See eMAR) ADL: See Care Tool for some details of mobility and selfcare  Therapy/Group: Individual Therapy  Francesa Eugenio OTR/L 12/21/2018, 12:16 PM

## 2018-12-21 NOTE — Progress Notes (Signed)
Physical Therapy Session Note  Patient Details  Name: Amanda [name] MRN: [id number] Date of Birth: November 22, 1940  Today's Date: 12/21/2018 PT Individual Time: [id number] PT Individual Time Calculation (min): 39 min   Short Term Goals: Week 1:  PT Short Term Goal 1 (Week 1): Pt will perfrom bed mobility min A PT Short Term Goal 2 (Week 1): Pt will transfer with RW min A PT Short Term Goal 3 (Week 1): Pt will ambulate 49ft with RW min A  Skilled Therapeutic Interventions/Progress Updates:    Pt received sitting in recliner with her husband present and pt agreeable to therapy session. Pt wearing TLSO and L CAM boot for entire session until pt donned TLSO sitting EOB at end of session prior to returning to supine. Sit<>stands using RW with CGA/min assist for balance and cuing to maintain L LE WBing precautions. Stand pivot recliner>w/c using RW with CGA/min assist for balance and continued cuing for [facility] - therapist educated pt on proper use of RW to maintain L LE TDWBing. Therapist educated pt/husband regarding donning w/c leg rests. Pt performed B UE w/c propulsion ~128ft with 1 rest break and supervision - hand-over-hand and max cuing for proper propulsion technique for turns. Pt reports that the TLSO arm braces are rubbing under her armpit causes a sore spot - RN notified and therapist requested speaking with PA/MD regarding strapping them over pt's shoulder instead. Stand pivot w/c>BSC using RW with CGA/min assist and continued cuing to maintain WBing. Standing LB clothing management with min assist for balance. Sit<>stand RW<>BSC with CGA for steadying. Pt continent of bladder and performed seated peri-care with set-up assist. Stand pivot BSC>w/c>EOB using RW with CGA/min assist for balance. Seated EOB performed L LE long arc quads with pt education on importance of maintaining quad strength despite inability bear weight on that LE at this time - pt repots "deep sunburn" type pain in posterior L  hip/gluteal region when doing this exercise - limited repetitions for pain management. Sit>supine with HOB slightly elevated and using bedrails with supervision and min cuing for reverse logroll technique. Supine L LE hip abduction/adduction x6 reps before pt reports again onset of the L hip/gluteal pain. Pt left supine in bed with needs in reach, her husband present, and RN present to assume care of patient.   Therapy Documentation Precautions:  Precautions Precautions: Fall, Back Precaution Comments: TLSO when OOB,  TWB on LLE for transfers only Required Braces or Orthoses: Other Brace Splint/Cast: CAM boot Other Brace: TLSO when OOB Restrictions Weight Bearing Restrictions: Yes LLE Weight Bearing: Touchdown weight bearing Other Position/Activity Restrictions: TWB on LLE for transfers only General:   Vital Signs:  Pain:  Reports L posterior hip/gluteal region pain described as a "deep sunburn" when performing LE exercises - provided rest breaks and modified exercises for pain management - RN present and aware at end of session.   Therapy/Group: Individual Therapy  Tawana Scale, PT, DPT 12/21/2018, 5:19 PM

## 2018-12-22 ENCOUNTER — Ambulatory Visit: Payer: Medicare Other | Admitting: Hematology

## 2018-12-22 ENCOUNTER — Inpatient Hospital Stay (HOSPITAL_COMMUNITY): Payer: 59 | Admitting: Occupational Therapy

## 2018-12-22 ENCOUNTER — Inpatient Hospital Stay (HOSPITAL_COMMUNITY): Payer: 59 | Admitting: Physical Therapy

## 2018-12-22 ENCOUNTER — Inpatient Hospital Stay (HOSPITAL_COMMUNITY): Payer: 59

## 2018-12-22 ENCOUNTER — Other Ambulatory Visit: Payer: Medicare Other

## 2018-12-22 ENCOUNTER — Ambulatory Visit: Payer: Medicare Other

## 2018-12-22 ENCOUNTER — Inpatient Hospital Stay (HOSPITAL_COMMUNITY): Payer: Medicare Other

## 2018-12-22 MED ORDER — BISACODYL 5 MG PO TBEC: 5.0000 mg | DELAYED_RELEASE_TABLET | Freq: Every day | ORAL | Status: DC | PRN

## 2018-12-22 MED ORDER — SORBITOL 70 % SOLN
60.0000 mL | Status: AC
  Administered 2018-12-22: 60 mL via ORAL
  Filled 2018-12-22: qty 60

## 2018-12-22 NOTE — Progress Notes (Signed)
Physical Therapy Weekly Progress Note  Patient Details  Name: Amanda [name] MRN: [id number] Date of Birth: Oct 13, 1940  Beginning of progress report period: December 16, 2018 End of progress report period: December 22, 2018  Today's Date: 12/22/2018  Patient has met 2 of 2 short term goals.  Pt is making good progress towards all LTG's. Pt currently requires CGA/min assist overall for stand pivot and squat pivot transfers and supervision for w/c mobility. Pt would benefit from continued skilled PT treatment to focus on bed mobility, transfers, w/c mobility, and caregiver training prior to pt's d/c.  Patient continues to demonstrate the following deficits muscle weakness, decreased cardiorespiratoy endurance, and decreased standing balance, decreased postural control, decreased balance strategies and difficulty maintaining precautions and therefore will continue to benefit from skilled PT intervention to increase functional independence with mobility.  Patient progressing toward long term goals..  Continue plan of care.  PT Short Term Goals Week 1:  PT Short Term Goal 1 (Week 1): Pt will perfrom bed mobility min A PT Short Term Goal 1 - Progress (Week 1): Met PT Short Term Goal 2 (Week 1): Pt will transfer with RW min A PT Short Term Goal 2 - Progress (Week 1): Met PT Short Term Goal 3 (Week 1): Pt will ambulate 48f with RW min A PT Short Term Goal 3 - Progress (Week 1): Discontinued (comment)(Pt TDWB LLE for transfers only.) Week 2:  PT Short Term Goal 1 (Week 2): STG = LTG due to estimated d/c date.    Therapy Documentation Precautions:  Precautions Precautions: Fall, Back Precaution Comments: TLSO when OOB,  TWB on LLE for transfers only Required Braces or Orthoses: Other Brace Splint/Cast: CAM boot Other Brace: TLSO when OOB Restrictions Weight Bearing Restrictions: Yes LLE Weight Bearing: Touchdown weight bearing Other Position/Activity Restrictions: TWB on LLE for transfers  only   Therapy/Group: Individual Therapy  VWaunita Schooner10/20/2020, 10:21 AM

## 2018-12-22 NOTE — Progress Notes (Signed)
Orthopedic Tech Progress Note Patient Details:  Amanda [name] 1940-05-18 [id number] RN called requesting to see if [employer] could come out and adjust the TLSO for patient. Patient ID: Amanda [name], female   DOB: [birth year], 78 y.o.   MRN: [id number]   Amanda [name] 12/22/2018, 8:35 AM

## 2018-12-22 NOTE — Progress Notes (Signed)
Physical Therapy Session Note  Patient Details  Name: Amanda [name] MRN: [id number] Date of Birth: 03/16/1940   Pt missed 30 min of skilled PT due to declining session due to fatigue. Pt reports being too exhausted from morning therapies and really needed to rest. All needs put in reach. Will follow up as able.   Therapy Documentation Precautions:  Precautions Precautions: Fall, Back Precaution Comments: TLSO when OOB,  TWB on LLE for transfers only Required Braces or Orthoses: Other Brace Splint/Cast: CAM boot Other Brace: TLSO when OOB Restrictions Weight Bearing Restrictions: Yes LLE Weight Bearing: Touchdown weight bearing Other Position/Activity Restrictions: TWB on LLE for transfers only General: PT Amount of Missed Time (min): 30 Minutes PT Missed Treatment Reason: Patient fatigue;Patient unwilling to participate   Canary Brim Ivory Broad, PT, DPT, CBIS  12/22/2018, 12:08 PM

## 2018-12-22 NOTE — Progress Notes (Addendum)
Physical Therapy Session Note  Patient Details  Name: Amanda [name] MRN: [id number] Date of Birth: 06-05-1940  Today's Date: 12/22/2018 PT Individual Time: 0800-0858 PT Individual Time Calculation (min): 58 min   Short Term Goals: Week 1:  PT Short Term Goal 1 (Week 1): Pt will perfrom bed mobility min A PT Short Term Goal 2 (Week 1): Pt will transfer with RW min A PT Short Term Goal 3 (Week 1): Pt will ambulate 59ft with RW min A  Skilled Therapeutic Interventions/Progress Updates:  Pt received in bed & agreeable to tx. Therapist dons cam boot & TLSO total assist for time management. Pt completes bed mobility with supervision, hospital bed features & cuing for log rolling. Pt transfers sit<>stand with CGA and stand pivot bed>w/c with RW & CGA with cuing for overall technique for hand placement & increased safety with pt still demonstrating impaired ability to reach back for seat. Pt propels w/c around unit for max of ~50 ft with BUE & supervision with rest breaks 2/2 trunk pain. In apartment, pt completes w/c<>couch transfer with squat pivot (per pt request) with CGA and cuing for w/c parts management. Pt propels w/c up/down north tower ramp with supervision and propels w/c between cones to simulate small spaces in home with cuing for technique and sharp turns. At end of session returned to room & transferred w/c>recliner with RW & stand pivot in same manner noted above. Pt left in recliner with chair alarm donned & all needs in reach.  Discussed vertigo symptoms with pt reporting she experiences nausea more than dizziness with exertion during therapies.  Addendum: Pt reports she has a step she uses to get in/out of bed at home. Therapist educated pt to have family remove box spring to lower bed height as pt is not safe to attempt hopping up step with RW 2/2 impaired balance & weight bearing precautions.   Therapy Documentation Precautions:  Precautions Precautions: Fall, Back Precaution  Comments: TLSO when OOB,  TWB on LLE for transfers only Required Braces or Orthoses: Other Brace Splint/Cast: CAM boot Other Brace: TLSO when OOB Restrictions Weight Bearing Restrictions: Yes LLE Weight Bearing: Touchdown weight bearing Other Position/Activity Restrictions: TWB on LLE for transfers only  Pain: Pt reports 6/10 back pain - pt reports she's premedicated & rest breaks provided PRN.    Therapy/Group: Individual Therapy  Waunita Schooner 12/22/2018, 10:10 AM

## 2018-12-22 NOTE — Progress Notes (Signed)
Physical Therapy Session Note  Patient Details  Name: Amanda [name] MRN: [id number] Date of Birth: [birth year]  Today's Date: 12/22/2018 PT Individual Time: 1001-1100 PT Individual Time Calculation (min): 59 min   Short Term Goals: Week 1:  PT Short Term Goal 1 (Week 1): Pt will perfrom bed mobility min A PT Short Term Goal 2 (Week 1): Pt will transfer with RW min A PT Short Term Goal 3 (Week 1): Pt will ambulate 68ft with RW min A  Skilled Therapeutic Interventions/Progress Updates:   Received pt sitting upright in recliner. Pt agreeable to PT and states pain is a 5-6/10 in chest and low back. Pt reports she was fatigued from her PT session this morning. Today's session focused on functional mobility/transfers, WC propulsion, LE strength, and improved tolerance to activity. L CAM boot and TLSO donned throughout session. Pt transferred stand pivot x4 trials throughout session with RW CGA while maintaining WB precautions. Pt performed WC mobility with supervision using UE's but requested to stop after 46ft due to fatigue and reported "my back feels like it's about to break". In supine, pt performed 2x20 bilateral SAQ with 3lb weight on RLE with wedge under knees for back pain relief. Pt performed 3x12 ball squeezes with 3 second hold. Pt performed bilateral chest press with 3lb weight in supine 1x12 but reported pain in R ribs stating it "feels like a sunburn". Pt performed 2x12 chest press with 1lb dowel and reported decrease in pain. Pt performed 3x12 SLR on RLE and 3x8 on LLE with verbal and tactile cues for knee extension to facilitate quad activation. Pt performed supine bridges 3x12 with verbal cues to squeeze glutes and for increased hip extension. Attempted bicep curls seated in WC with 3lb weight but pt stated "I just can't do anymore, I have to get back to bed". Pt encouraged to fully participate in therapy and educated pt on benefits of mobility and being OOB. Concluded session with pt supine  in bed, TLSO and L CAM boot doffed, needs within reach, and bed alarm on. RN present putting pain patch on back.   Therapy Documentation Precautions:  Precautions Precautions: Fall, Back Precaution Comments: TLSO when OOB,  TWB on LLE for transfers only Required Braces or Orthoses: Other Brace Splint/Cast: CAM boot Other Brace: TLSO when OOB Restrictions Weight Bearing Restrictions: Yes LLE Weight Bearing: Touchdown weight bearing Other Position/Activity Restrictions: TWB on LLE for transfers only  Therapy/Group: Individual Therapy  Alfonse Alpers PT, DPT   12/22/2018, 7:44 AM
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
## 2018-12-22 NOTE — Progress Notes (Signed)
Occupational Therapy Session Note  Patient Details  Name: Amanda [name] MRN: [id number] Date of Birth: 1940/11/15  Today's Date: 12/22/2018 OT Individual Time: 1345-1415 OT Individual Time Calculation (min): 30 min    Skilled Therapeutic Interventions/Progress Updates:    1:1 Pt resting in the bed when arrived. Pt reports feeling very tired/ fatigue and in pain today after the early therapy sessions. Pt reports needing to void after suppositories . Pt transition from supine to sitting with min A with extra time. Total A to don CAM boot. Pt perform contact guard transfer into w/c with RW and again to toilet. Pt with difficulty with maintaining weight bearing precautions with transfers. Pt able to void- but required total A for toileting. Pt contact guard with RW back into w/c. Pt transferred into be squat pivot into bed with close supervision with contact guard to return to supine.   Left pt resting in the bed.   Therapy Documentation Precautions: Precautions Precautions: Fall, Back Precaution Comments: TLSO when OOB,  TWB on LLE for transfers only Required Braces or Orthoses: Other Brace Splint/Cast: CAM boot Other Brace: TLSO when OOB Restrictions Weight Bearing Restrictions: Yes LLE Weight Bearing: Touchdown weight bearing Other Position/Activity Restrictions: TWB on LLE for transfers only General: General OT Amount of Missed Time: 15 Minutes PT Missed Treatment Reason: Patient fatigue;Patient unwilling to participate Vital Signs: Therapy Vitals Temp: 97.8 F (36.6 C) Temp Source: Oral Pulse Rate: 69 Resp: 14 BP: (!) 151/74 Patient Position (if appropriate): Lying Oxygen Therapy SpO2: 93 % O2 Device: Room Air Pain: Pain Assessment Pain Scale: 0-10 Pain Score: 8  all over   Therapy/Group: Individual Therapy  Willeen Cass [facility] 12/22/2018, 2:50 PM

## 2018-12-23 ENCOUNTER — Inpatient Hospital Stay (HOSPITAL_COMMUNITY): Payer: 59 | Admitting: Physical Therapy

## 2018-12-23 ENCOUNTER — Inpatient Hospital Stay (HOSPITAL_COMMUNITY): Payer: 59

## 2018-12-23 NOTE — Progress Notes (Signed)
Occupational Therapy Session Note  Patient Details  Name: Amanda [name] MRN: [id number] Date of Birth: 1940/12/09  Today's Date: 12/23/2018 OT Individual Time: 1400-1455 OT Individual Time Calculation (min): 55 min    Short Term Goals: Week 1:  OT Short Term Goal 1 (Week 1): Pt will transfer to/ from BSC/ toilet with supervision OT Short Term Goal 2 (Week 1): pt will perform toileting with contact guard while maintaining weight bearing precautions OT Short Term Goal 3 (Week 1): Pt will bathe in seated position with AE as needed with min A  at shower level OT Short Term Goal 4 (Week 1): Pt will don LB clothing including footware with min A      Skilled Therapeutic Interventions/Progress Updates:  Pt received in recliner, no c/o pain. Pt completed chair <> w/c transfer with CGA. Transfers w/c <> BSC in shower with min A for balance and min VC to use grab bars. Pt completes UB bathing with (S) and LB bathing with min A to wash buttocks. Pt educated on lateral leans to wash buttocks d/t inability to standing w/o boot. Pt is mod A for donning socks, requiring help to thread tows in B feet. Pt is (S) for UB dressing and max A for LB dressing to thread legs into pants and pull up over waist. Pt requires min A for donning TLSO. Pt reporting she does not want a BSC for home; reports stand pivot to regular toilet is easier. OT educated on benefits of raised toilet seat of BSC and ability to have commode at bedside. Pt declined and performed squat pivot transfer w/c <> toilet with CGA. Pt completes oral care seated in w/c at sink. End of session returned to bed, bed alarm set and all needs met.       Therapy Documentation Precautions:  Precautions Precautions: Fall, Back Precaution Comments: TLSO when OOB,  TWB on LLE for transfers only Required Braces or Orthoses: Other Brace Splint/Cast: CAM boot Other Brace: TLSO when OOB Restrictions Weight Bearing Restrictions: Yes LLE Weight Bearing:  Touchdown weight bearing Other Position/Activity Restrictions: TWB on LLE for transfers only      Therapy/Group: Individual Therapy  Mattisen Pohlmann 12/23/2018, 2:57 PM
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
## 2018-12-23 NOTE — Progress Notes (Signed)
Physical Therapy Session Note  Patient Details  Name: Amanda [name] MRN: [id number] Date of Birth: [birth year]  Today's Date: 12/23/2018 PT Individual Time: [id number] PT Individual Time Calculation (min): 38 min   Short Term Goals: Week 2:  PT Short Term Goal 1 (Week 2): STG = LTG due to estimated d/c date.  Skilled Therapeutic Interventions/Progress Updates:    Pt seen this session for make-up time, agreeable to therapy tx. Pt declines OOB activity bc of fatigue and pain, rates pain 6/10. Pt performed the following exercises while supine in bed for LE strengthening, cues for techniques, 2 x 10 of each: heel slides, SLR, hip abduction, ankle pumps and SAQ. Pt reports having to use the bathroom. Therapist assisted to don L CAM boot. Pt transferred to sitting EOB with min assist and performed stand pivot to w/c with RW and CGA. Pt transported into bathroom and performed stand pivot w/c<>toilet with grab bar and min assist, therapist assisted with clothing management. Pt continent of bladder and performed pericare without assist. Sat in w/c at sink to wash hands. Transferred back to bed squat pivot CGA. Pt transferred to supine with min assist and left with needs in reach and bed alarm set.   Therapy Documentation Precautions:  Precautions Precautions: Fall, Back Precaution Comments: TLSO when OOB,  TWB on LLE for transfers only Required Braces or Orthoses: Other Brace Splint/Cast: CAM boot Other Brace: TLSO when OOB Restrictions Weight Bearing Restrictions: Yes LLE Weight Bearing: Touchdown weight bearing Other Position/Activity Restrictions: TWB on LLE for transfers only    Therapy/Group: Individual Therapy  Netta Corrigan, PT, DPT, CSRS 12/23/2018, 4:23 PM

## 2018-12-23 NOTE — Progress Notes (Signed)
Physical Therapy Session Note  Patient Details  Name: Amanda [name] MRN: [id number] Date of Birth: [birth year]  Today's Date: 12/23/2018 PT Individual Time: 0800-0859 PT Individual Time Calculation (min): 59 min   Short Term Goals: Week 1:  PT Short Term Goal 1 (Week 1): Pt will perfrom bed mobility min A PT Short Term Goal 1 - Progress (Week 1): Met PT Short Term Goal 2 (Week 1): Pt will transfer with RW min A PT Short Term Goal 2 - Progress (Week 1): Met PT Short Term Goal 3 (Week 1): Pt will ambulate 46f with RW min A PT Short Term Goal 3 - Progress (Week 1): Discontinued (comment)(Pt TDWB LLE for transfers only.)  Skilled Therapeutic Interventions/Progress Updates:   Received pt supine in bed, pt agreeable to PT and states pain 6/10 with movement in back and chest. Session focused on functional mobility/transfers, UE/LE strength, balance, and improved tolerance to activity. Pt performed bed mobility with supervision with HOB elevated. While sitting EOB PT donned TLSO and L CAM boot max A. Pt transferred stand pivot without RW x 1 min A for balance and stand pivot with RW CGA x 3 trials throughout session while maintaining WB precautions. Pt performed car transfer with RW CGA with verbal cues for turning, foot placement, and WB precautions. Pt performed bed mobility on flat mat with supervision but stated that the pain in her back increased. Sitting on mat without back support pt performed 3x12 bilateral isometric IR against ball with verbal cues to keep elbows at 90 degrees. Pt performed bilateral seated bicep curls with 3lb weight 2x12, seated rows with purple resistance 2x12 with verbal cues to bring shoulders down and back and squeeze shoulder blades. Pt performed 2x12 LAQ RLE seated on mat with decreased ROM to remain in pain free range, unable to complete on LLE due to increased pain. Pt performed WC mobility 345fwith bilateral UE and supervision. Pt transferred stand pivot with RW back  to bed CGA. Concluded session with pt supine in bed, needs within reach, and bed alarm on.  Therapy Documentation Precautions:  Precautions Precautions: Fall, Back Precaution Comments: TLSO when OOB,  TWB on LLE for transfers only Required Braces or Orthoses: Other Brace Splint/Cast: CAM boot Other Brace: TLSO when OOB Restrictions Weight Bearing Restrictions: Yes LLE Weight Bearing: Touchdown weight bearing Other Position/Activity Restrictions: TWB on LLE for transfers only   Therapy/Group: Individual Therapy  AnAlfonse AlpersT, DPT   12/23/2018, 7:57 AM

## 2018-12-23 NOTE — Progress Notes (Signed)
Social Work Patient ID: Amanda [name], female   DOB: [birth year], 78 y.o.   MRN: [id number]  Have reviewed team conference with pt who is aware and agreeable with targeted d/c date of 10/24 and supervision goals overall.  Discussed [place] agency preference and DME recommendations.  Pt working with therapies to set up family ed with spouse on Friday.  Continue to follow.  Lillard Bailon, LCSW

## 2018-12-23 NOTE — Progress Notes (Signed)
Physical Therapy Session Note  Patient Details  Name: Amanda [name] MRN: [id number] Date of Birth: December 01, 1940  Today's Date: 12/23/2018 PT Individual Time: 1052-1200 PT Individual Time Calculation (min): 68 min   Short Term Goals: Week 2:  PT Short Term Goal 1 (Week 2): STG = LTG due to estimated d/c date.  Skilled Therapeutic Interventions/Progress Updates:  Pt received in bed & agreeable to tx. L cam boot already donned. Pt completes supine>sidelying R>sitting with bed flat, no rails & cuing for log rolling. Therapist assisted pt with donning TLSO once sitting EOB. Pt transferred bed>BSC with RW & supervision with ongoing cuing for overall technique. Pt managed clothing without assistance & had continent void on toilet. Pt returned to bed then transferred to w/c in same manner. In apartment, focused on pt setting up w/c & managing w/c parts with cuing from therapist. pt completes squat pivot w/c<>couch with supervision overall & was able to instruct therapist on how to set up w/c in preparation for instructing her family members. Pt propels w/c apartment>dayroom with BUE & supervision with rest breaks PRN 2/2 fatigue. Pt completes squat pivot w/c<>nu-step with supervision & pt utilized nu-step on level 1 x 10 minutes with BUE & RLE with task focusing on global strengthening & endurance training.  Back in room, pt completes stand pivot w/c>recliner with RW & min assist. Once in w/c pt performs BLE long arc quads for strengthening. Pt left in recliner with chair alarm donned, BLE elevated, & all needs in reach.  Therapy Documentation Precautions:  Precautions Precautions: Fall, Back Precaution Comments: TLSO when OOB,  TWB on LLE for transfers only Required Braces or Orthoses: Other Brace Splint/Cast: CAM boot Other Brace: TLSO when OOB Restrictions Weight Bearing Restrictions: Yes LLE Weight Bearing: Touchdown weight bearing Other Position/Activity Restrictions: TWB on LLE for transfers  only   Pain: At end of session pt reports 7/10 pain in L lower back - pain meds requested.    Therapy/Group: Individual Therapy  Waunita Schooner 12/23/2018, 12:29 PM

## 2018-12-24 ENCOUNTER — Inpatient Hospital Stay (HOSPITAL_COMMUNITY): Payer: Medicare Other

## 2018-12-24 ENCOUNTER — Inpatient Hospital Stay (HOSPITAL_COMMUNITY): Payer: Medicare Other | Admitting: Physical Therapy

## 2018-12-24 MED ORDER — ACETAMINOPHEN 325 MG PO TABS: 650.0000 mg | ORAL_TABLET | Freq: Four times a day (QID) | ORAL | Status: DC | PRN

## 2018-12-24 MED ORDER — TRAMADOL HCL 50 MG PO TABS: 50.0000 mg | ORAL_TABLET | Freq: Four times a day (QID) | ORAL | 0 refills | Status: DC

## 2018-12-24 MED ORDER — LIDOCAINE 5 % EX PTCH: 2.0000 | MEDICATED_PATCH | CUTANEOUS | 0 refills | Status: DC

## 2018-12-24 MED ORDER — ASPIRIN 81 MG PO CHEW: 81.0000 mg | CHEWABLE_TABLET | Freq: Every day | ORAL | Status: DC

## 2018-12-24 MED ORDER — AMLODIPINE BESYLATE 5 MG PO TABS: 5.0000 mg | ORAL_TABLET | Freq: Every day | ORAL | 1 refills | Status: DC

## 2018-12-24 MED ORDER — BUPROPION HCL ER (XL) 150 MG PO TB24: 150.0000 mg | ORAL_TABLET | Freq: Every day | ORAL | 0 refills | Status: DC

## 2018-12-24 MED ORDER — FAMOTIDINE 20 MG PO TABS: ORAL_TABLET | ORAL | 11 refills | Status: DC

## 2018-12-24 MED ORDER — TRAZODONE HCL 50 MG PO TABS: 50.0000 mg | ORAL_TABLET | Freq: Every evening | ORAL | 0 refills | Status: DC | PRN

## 2018-12-24 MED ORDER — LEVOTHYROXINE SODIUM 100 MCG PO TABS: 100.0000 ug | ORAL_TABLET | Freq: Every day | ORAL | 0 refills | Status: DC

## 2018-12-24 MED ORDER — TRAZODONE HCL 50 MG PO TABS: 50.0000 mg | ORAL_TABLET | Freq: Every evening | ORAL | 1 refills | Status: DC | PRN

## 2018-12-24 MED ORDER — OXYCODONE HCL 5 MG PO TABS: 5.0000 mg | ORAL_TABLET | ORAL | 0 refills | Status: DC | PRN

## 2018-12-24 NOTE — Progress Notes (Signed)
[facility] PHYSICAL MEDICINE & REHABILITATION PROGRESS NOTE   Subjective/Complaints: No new issues this morning. Has chest wall and back pain but it no worse.   ROS: Patient denies fever, rash, sore throat, blurred vision, nausea, vomiting, diarrhea, cough, shortness of breath or chest pain, joint or back pain, headache, or mood change.    Objective:   Dg Ankle 2 Views Right  Result Date: 12/22/2018 CLINICAL DATA:  Right ankle swelling after injury last week. EXAM: RIGHT ANKLE - 2 VIEW COMPARISON:  None. FINDINGS: There is no evidence of fracture, dislocation, or joint effusion. There is no evidence of arthropathy or other focal bone abnormality. Soft tissues are unremarkable. IMPRESSION: Negative. Electronically Signed   By: Marijo Conception M.D.   On: 12/22/2018 12:58   No results for input(s): WBC, HGB, HCT, PLT in the last 72 hours. No results for input(s): NA, K, CL, CO2, GLUCOSE, BUN, CREATININE, CALCIUM in the last 72 hours.  Intake/Output Summary (Last 24 hours) at 12/24/2018 1026 Last data filed at 12/23/2018 2000 Gross per 24 hour  Intake 240 ml  Output -  Net 240 ml     Physical Exam: Vital Signs Blood pressure (!) 166/75, pulse 70, temperature 98 F (36.7 C), resp. rate 17, height 5\' 1"  (1.549 m), weight 63.8 kg, SpO2 95 %. Constitutional: No distress . Vital signs reviewed. HEENT: EOMI, oral membranes moist Neck: supple Cardiovascular: RRR without murmur. No JVD    Respiratory: CTA Bilaterally without wheezes or rales. Normal effort    GI: BS +, non-tender, non-distended   Skin: Scattered abrasions improving Scattered bruising improved, left ankle wounds CDI Psych: Normal mood.  Normal behavior. Musc: bruising right ankle. Tenderness along right chest wall with palpt Neurological: Alert Motor: RUE: 4/5 proximal distal with some pain inhibition, unchanged Right lower extremity: 4/5 proximal distal (some pain inhibition) Left lower extremity: 4/5 hip flexion,  4+/5 knee extension, ADF/PF -    Assessment/Plan: 1. Functional deficits secondary to polytrauma which require 3+ hours per day of interdisciplinary therapy in a comprehensive inpatient rehab setting.  Physiatrist is providing close team supervision and 24 hour management of active medical problems listed below.  Physiatrist and rehab team continue to assess barriers to discharge/monitor patient progress toward functional and medical goals  Care Tool:  Bathing    Body parts bathed by patient: Right arm, Left arm, Chest, Abdomen, Front perineal area, Right upper leg, Left upper leg, Right lower leg, Left lower leg, Face   Body parts bathed by helper: Buttocks     Bathing assist Assist Level: Minimal Assistance - Patient > 75%     Upper Body Dressing/Undressing Upper body dressing   What is the patient wearing?: Pull over shirt    Upper body assist Assist Level: Supervision/Verbal cueing    Lower Body Dressing/Undressing Lower body dressing      What is the patient wearing?: Pants     Lower body assist Assist for lower body dressing: Maximal Assistance - Patient 25 - 49%     Toileting Toileting    Toileting assist Assist for toileting: Minimal Assistance - Patient > 75%     Transfers Chair/bed transfer  Transfers assist  Chair/bed transfer activity did not occur: Safety/medical concerns  Chair/bed transfer assist level: Contact Guard/Touching assist     Locomotion Ambulation   Ambulation assist      Assist level: Moderate Assistance - Patient 50 - 74% Assistive device: Walker-rolling Max distance: 10ft   Walk 10 feet activity  Assist  Walk 10 feet activity did not occur: Safety/medical concerns(Fatigue, weakness, nausea)        Walk 50 feet activity   Assist Walk 50 feet with 2 turns activity did not occur: Safety/medical concerns(fatigue, weakness, nausea)         Walk 150 feet activity   Assist Walk 150 feet activity did not occur:  Safety/medical concerns(fatigue, weakness, nausea)         Walk 10 feet on uneven surface  activity   Assist Walk 10 feet on uneven surfaces activity did not occur: Safety/medical concerns(fatigue, weakness, nausea)         Wheelchair     Assist Will patient use wheelchair at discharge?: Yes Type of Wheelchair: Manual    Wheelchair assist level: Supervision/Verbal cueing Max wheelchair distance: 150 ft    Wheelchair 50 feet with 2 turns activity    Assist    Wheelchair 50 feet with 2 turns activity did not occur: Safety/medical concerns(fatigue, nausea, SOB)   Assist Level: Supervision/Verbal cueing   Wheelchair 150 feet activity     Assist  Wheelchair 150 feet activity did not occur: Safety/medical concerns(fatigue, nausea, SOB)   Assist Level: Supervision/Verbal cueing   Blood pressure (!) 166/75, pulse 70, temperature 98 F (36.7 C), resp. rate 17, height 5\' 1"  (1.549 m), weight 63.8 kg, SpO2 95 %.  Medical Problem List and Plan: 1.  Decreased functional mobility secondary to left trimalleolar ankle fracture status post ORIF and closure of left knee laceration 12/12/2018     Sternal fracture, multiple rib fractures, right breast hematoma, abdominal wall hematoma L1, L2 and L4 vertebral body fractures with L1 superior endplate compression fracture-soft backpack TLSO brace   -TDWB LLE  Continue CIR              2.  Antithrombotics: -DVT/anticoagulation: Lovenox.  vascular study negative             -antiplatelet therapy: Aspirin 81 mg daily 3. Pain Management: Lidoderm patch, Ultram 50 mg every 6 hours, oxycodone as needed             Monitor with increased activity.  -may use lidoderm patch for chest wall pain  Relatively controlled on 10/21  -likely suffered a right ankle sprain with accident. Doesn't need to hop up into home bed yet as she will be staying with daughter on lower bed   -can splint right ankle if needed   -xrays negative 4. Mood:  Wellbutrin 150 mg daily             -antipsychotic agents: N/A 5. Neuropsych: This patient is capable of making decisions on her own behalf. 6. Skin/Wound Care: foam dressings to ankle wounds.  -POD#12 today from ankle surgery--consider removing staples before discharge.  7. Fluids/Electrolytes/Nutrition: encourage PO, appetite not great  -continue protein supps for low albumin 8.  Acute blood loss anemia.   Hemoglobin 8.2 on 10/15--->9.5 10/19 9.  Hypothyroidism.  Cont Synthroid.  10.  Lymphoma.  Chemotherapy recently completed. 11.  Drug induced constipation.  Laxative assistance, continue senokot-s bid  +bm with sorbitol  -nausea improved 12. 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 13. Urinary frequency:    -denies baseline issues  -sl improvement  -ua negative, ucx  unremarkable 14. HTN:  Norvasc 5mg  daily  bp borderline-normal---will not change prior to discharge, titrate further if needed as outpt once pain is less. 15.  Thrombocytopenia/pancytopenia  Platelets 131 on 10/15---293    -  wbcs 2.6     LOS: 8 days A FACE TO FACE EVALUATION WAS PERFORMED  Meredith Staggers 12/24/2018, 10:26 AM
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
## 2018-12-24 NOTE — Progress Notes (Signed)
Occupational Therapy Weekly Progress Note  Patient Details  Name: Amanda [name] MRN: [id number] Date of Birth: Nov 08, 1940  Beginning of progress report period: December 24, 2018 End of progress report period: December 24, 2018  Today's Date: 12/24/2018 OT Individual Time: 0900-1015 OT Individual Time Calculation (min): 75 min    Patient has met 4 of 4 short term goals.  Pt has made good progress towards supervision goals. Goals were downgraded to supervision d/t pt wanting 24/7 supervision and husband will be present at all times. Pt has demoed improved balance and adherence to precautions throughout functional mobility. Pt continues to require A for CAM boot donning and doffing. Pt husband will be present for family training prior to d/c  Patient continues to demonstrate the following deficits: muscle weakness, decreased cardiorespiratoy endurance, decreased safety awareness and decreased sitting balance, decreased standing balance, decreased balance strategies and difficulty maintaining precautions and therefore will continue to benefit from skilled OT intervention to enhance overall performance with BADL.  Patient goals downgraded to reflexct 24/7 supervision per pt request.  Continue plan of care.  OT Short Term Goals Week 1:  OT Short Term Goal 1 (Week 1): Pt will transfer to/ from BSC/ toilet with supervision OT Short Term Goal 1 - Progress (Week 1): Met OT Short Term Goal 2 (Week 1): pt will perform toileting with contact guard while maintaining weight bearing precautions OT Short Term Goal 2 - Progress (Week 1): Met OT Short Term Goal 3 (Week 1): Pt will bathe in seated position with AE as needed with min A  at shower level OT Short Term Goal 3 - Progress (Week 1): Met OT Short Term Goal 4 (Week 1): Pt will don LB clothing including footware with min A OT Short Term Goal 4 - Progress (Week 1): Met Week 2:  OT Short Term Goal 1 (Week 2): STG=LTG d/t ELOS  Skilled Therapeutic  Interventions/Progress Updates:    1;1. Pt received in bed with minimal pain in R shoulder. Pt reeporting need to toilet but declines bathing this date. Pt completes donning TLSO with MIN A and max VC and OT dons CAM with total A. Pt completes squat pivot transfer EOB<>w/c<>toilet with S. Pt manages clothing with S in standing and hygiene with S seated. Pt completes grooming at sink with MOD I. Pt able to doff brace and shirt to change with S. Pt able to doff/don pants and socks with S. OT dons cam boot and pt stands with S for CM. Pt agreeable to transfers in ADL room with RW to recliner and bed with S and VC for reach back for safety. Pt completes standing cornhole toss game with S overall reaching mod ranges outside BOS. Exited esssion with pt seated in recliner, call light in reach and all nee smet   Therapy Documentation Precautions:  Precautions Precautions: Fall, Back Precaution Comments: TLSO when OOB,  TWB on LLE for transfers only Required Braces or Orthoses: Other Brace Splint/Cast: CAM boot Other Brace: TLSO when OOB Restrictions Weight Bearing Restrictions: Yes LLE Weight Bearing: Touchdown weight bearing Other Position/Activity Restrictions: TWB on LLE for transfers only General:   Vital Signs:  Pain: Pain Assessment Pain Scale: 0-10 Pain Score: 3  Pain Location: Rib cage Pain Orientation: Right Pain Descriptors / Indicators: Aching;Discomfort Pain Frequency: Intermittent Pain Onset: On-going Patients Stated Pain Goal: 2 ADL: ADL Grooming: Supervision/safety Upper Body Bathing: Contact guard Where Assessed-Upper Body Bathing: Edge of bed Lower Body Bathing: Maximal assistance Upper Body Dressing: Supervision/safety  Where Assessed-Upper Body Dressing: Edge of bed Lower Body Dressing: Maximal assistance Where Assessed-Lower Body Dressing: Edge of bed Toileting: Moderate assistance Where Assessed-Toileting: Bedside Commode Toilet Transfer: Moderate  assistance Toilet Transfer Method: Stand pivot Social research officer, government: Not assessed Vision   Perception    Praxis   Exercises:   Other Treatments:     Therapy/Group: Individual Therapy  Tonny Branch 12/24/2018, 9:19 AM

## 2018-12-24 NOTE — Progress Notes (Signed)
Physical Therapy Session Note  Patient Details  Name: Amanda [name] MRN: [id number] Date of Birth: 1940/07/01  Today's Date: 12/24/2018 PT Individual Time: 1100-1201 PT Individual Time Calculation (min): 61 min   Short Term Goals: Week 1:  PT Short Term Goal 1 (Week 1): Pt will perfrom bed mobility min A PT Short Term Goal 1 - Progress (Week 1): Met PT Short Term Goal 2 (Week 1): Pt will transfer with RW min A PT Short Term Goal 2 - Progress (Week 1): Met PT Short Term Goal 3 (Week 1): Pt will ambulate 21f with RW min A PT Short Term Goal 3 - Progress (Week 1): Discontinued (comment)(Pt TDWB LLE for transfers only.)  Skilled Therapeutic Interventions/Progress Updates:   Received pt sitting in recliner, pt agreeable to PT and states pain 6/10 in back and chest with movement. TLSO and L CAM boot donned for session and doffed at end of session. Session focused on functional mobility/transfers, WC propulsion, WC adjustments, balance, LE strength, and improved tolerance to activity. Pt performed stand pivot transfers without AD with supervision throughout session x10 while maintaining TWB precautions. Pt performed WC mobility 1525fwith supervision and 1 rest break using bilateral UE's. Seated with back supported pt performed bilateral 1x15 LAQ with 3lb weight on RLE. While seated pt also performed bilateral 1x15 marches with 3lb on RLE. While standing with 1 UE support on RW pt threw horseshoes x 4 trials CGA focusing on standing dynamic balance. While standing pt matched cards to Velcro board x2 trials without UE support, focusing on single leg balance on RLE CGA. In standing pt performed 1x10 standing marches with bilateral UE support on RW CGA, while standing on RLE. Pt performed toilet transfer CGA with use of grab bars and PT provided min A for clothing management for time management. Pt performed bed mobility with supervision. Concluded session with pt supine in bed, needs within reach, and bed  alarm on.   Therapy Documentation Precautions:  Precautions Precautions: Fall, Back Precaution Comments: TLSO when OOB,  TWB on LLE for transfers only Required Braces or Orthoses: Other Brace Splint/Cast: CAM boot Other Brace: TLSO when OOB Restrictions Weight Bearing Restrictions: Yes LLE Weight Bearing: Touchdown weight bearing Other Position/Activity Restrictions: TWB on LLE for transfers only   Therapy/Group: Individual Therapy  AnAlfonse AlpersT, DPT   12/24/2018, 7:47 AM

## 2018-12-24 NOTE — Progress Notes (Signed)
Physical Therapy Session Note  Patient Details  Name: Amanda [name] MRN: [id number] Date of Birth: 1940/04/23  Today's Date: 12/24/2018 PT Individual Time: 1415-1515 PT Individual Time Calculation (min): 60 min   Short Term Goals: Week 1:  PT Short Term Goal 1 (Week 1): Pt will perfrom bed mobility min A PT Short Term Goal 1 - Progress (Week 1): Met PT Short Term Goal 2 (Week 1): Pt will transfer with RW min A PT Short Term Goal 2 - Progress (Week 1): Met PT Short Term Goal 3 (Week 1): Pt will ambulate 18f with RW min A PT Short Term Goal 3 - Progress (Week 1): Discontinued (comment)(Pt TDWB LLE for transfers only.)  Skilled Therapeutic Interventions/Progress Updates:  Pt received upright in wheelchair with TLSO brace on. Pt agreeable to treatment. SPT donned pt's L CAM walking boot. Pt transported in wheelchair to 1st floor, outside of hospital. Pt self propelled wheelchair over 350 feet total with minA including level surfaces, slight uphill surfaces and slight downgrade surfaces. Pt able to self propel wheelchair in 75 feet increments with minA. Pt required rest breaks with wheelchair breaks on d/t fatigue and "pain in sternum". In addition, pt required verbal and tactile cueing on unlevel surfaces for safety. Pt transported back to her room on rehab unit requesting to get back to bed. Pt transferred via stand pivot transfer with minA. Pt required verbal instruction for TTWB with poor carryover during transfer. Pt left positioned in bed with bed exit alarm on and call bell within reach. All needs met at this time.      Therapy Documentation Precautions:  Precautions Precautions: Fall, Back Precaution Comments: TLSO when OOB,  TWB on LLE for transfers only Required Braces or Orthoses: Other Brace Splint/Cast: CAM boot Other Brace: TLSO when OOB Restrictions Weight Bearing Restrictions: Yes LLE Weight Bearing: Touchdown weight bearing Other Position/Activity Restrictions: TWB on LLE  for transfers only  Pain: reports chest wall pain, but RN provided her with pain meds   Therapy/Group: Individual Therapy  KOlena Leatherwood SPT  12/24/2018, 5:38 PM

## 2018-12-25 ENCOUNTER — Inpatient Hospital Stay (HOSPITAL_COMMUNITY): Payer: Medicare Other

## 2018-12-25 ENCOUNTER — Inpatient Hospital Stay (HOSPITAL_COMMUNITY): Payer: Medicare Other | Admitting: Physical Therapy

## 2018-12-25 NOTE — Progress Notes (Signed)
Physical Therapy Discharge Summary  Patient Details  Name: Amanda [name] MRN: [id number] Date of Birth: 06/01/1940  Today's Date: 12/25/2018 PT Individual Time: 0812-0922 and 1020-1109 PT Individual Time Calculation (min): 70 min and 49 mim   Patient has met 8 of 8 long term goals due to improved activity tolerance, improved balance, improved postural control, increased strength, decreased pain, ability to compensate for deficits, improved attention, improved awareness and improved coordination.  Patient to discharge at a wheelchair level supervision for w/c mobility & transfers only.    No family present during PT sessions for caregiver education, but plans to come for OT session later today.  Reasons goals not met: n/a  Recommendation:  Patient will benefit from ongoing skilled PT services in home health setting to continue to advance safe functional mobility, address ongoing impairments in standing balance, transfers, gait when able, w/c mobility, endurance, strengthening, and minimize fall risk.  Equipment: RW, w/c with ELR  Reasons for discharge: treatment goals met and discharge from hospital  Patient/family agrees with progress made and goals achieved: Yes  Skilled PT Treatment: Treatment 1: Pt received in bed & agreeable to tx. Pt reports unrated back pain during session but states she's premedicated & rest breaks provided PRN. Pt completes bed mobility with bed flat, no rails & supervision with instructional cuing for log rolling. Therapist assist pt with donning TLSO & L CAM boot sitting EOB. Pt transfers bed>w/c via squat pivot with supervision. Pt propels w/c room>ortho gym & around unit with BUE & supervision & completes car transfer at SUV simulated height via stand pivot with RW & supervision. Pt completes w/c<>couch via squat pivot with supervision and w/c<>standard chair without armrests with RW & CGA after therapist provides instructional cuing & demo. At end of session  pt assisted to recliner via stand pivot with RW & supervision. Pt left in recliner with chair alarm donned, all needs in reach.  Pt requires frequent rest breaks throughout session 2/2 SOB with therapist educating pt on pursed lip breathing.  Reviewed f/u HHPT & back precautions & need to wear braces with pt. Manual testing completed, as noted below.   Treatment 2: Pt received in recliner & agreeable to tx. Pt reports unrated L lower back pain & rest breaks taken PRN. Pt transfers recliner>bed with RW & supervision via stand pivot with cuing for technique & to reach back for stable surface. Assisted pt with doffing/donning TLSO & L cam boot during session. Pt completes sit<>supine with supervision & elects to use hospital bed features. Provided pt with supine LE HEP: heel slides, short arc quads, hip abduction & straight leg raises with therapist providing instructional cuing for technique & exercises focusing on LLE strengthening. Pt reports increased L low back pain during hip abduction slides so therapist recommends pt not do those & pain patch requested. Pt also required AAROM for LLE straight leg raises & only able to perform 5 repetitions before increased back pain. Pt reports need to use restroom and transfers bed>w/c via squat pivot with supervision, w/c<>toilet with stand pivot, CGA & grab bar. Pt manages clothing without assistance & has continent void on toilet. Pt performs hand hygiene at sink from w/c level with set up assist. Pt requests to return to bed 2/2 pain & fatigue. Assisted pt back to bed & pt left with alarm set & all needs in reach.    PT Discharge Precautions/Restrictions Precautions Precautions: Fall;Back Precaution Comments: TLSO when OOB,  TDWB on LLE for transfers  only Required Braces or Orthoses: Other Brace Splint/Cast: CAM boot Other Brace: TLSO when OOB Restrictions Weight Bearing Restrictions: Yes LLE Weight Bearing: Touchdown weight bearing(for transfers  only) Other Position/Activity Restrictions: TDWB LLE for transfers only  Vision/Perception  Pt wears glasses at all times at baseline.  No changes in baseline vision. Perception WFL. Praxis Intact.  Cognition Overall Cognitive Status: Within Functional Limits for tasks assessed Arousal/Alertness: Awake/alert Orientation Level: Oriented X4 Memory: Appears intact Awareness: Appears intact Safety/Judgment: Appears intact  Sensation Sensation Light Touch: Appears Intact(BLE) Proprioception: Appears Intact(BLE) Coordination Gross Motor Movements are Fluid and Coordinated: No Fine Motor Movements are Fluid and Coordinated: Yes Heel Shin Test: speed decreased on LLE but able to complete  Motor  Motor Motor: Abnormal postural alignment and control Motor - Discharge Observations: generalized deconditioning   Mobility Bed Mobility Bed Mobility: Rolling Right;Rolling Left;Right Sidelying to Sit;Sit to Sidelying Right(bed flat, no rails) Rolling Right: Supervision/verbal cueing Rolling Left: Supervision/Verbal cueing Right Sidelying to Sit: Supervision/Verbal cueing Sit to Sidelying Right: Supervision/Verbal cueing Transfers Transfers: Sit to Stand;Stand to Sit;Stand Pivot Transfers;Squat Pivot Transfers Sit to Stand: Supervision/Verbal cueing Stand to Sit: Supervision/Verbal cueing Stand Pivot Transfers: Supervision/Verbal cueing Stand Pivot Transfer Details: Verbal cues for gait pattern;Verbal cues for sequencing;Verbal cues for technique;Verbal cues for precautions/safety Stand Pivot Transfer Details (indicate cue type and reason): with use of RW Squat Pivot Transfers: Supervision/Verbal cueing   Locomotion  Gait Ambulation: No Gait Gait: No Stairs / Additional Locomotion Stairs: No Wheelchair Mobility Wheelchair Mobility: Yes Wheelchair Propulsion: Both upper extremities Wheelchair Parts Management: Needs assistance Distance: 150 ft   Trunk/Postural Assessment   Cervical Assessment Cervical Assessment: Within Functional Limits Thoracic Assessment Thoracic Assessment: Exceptions to WFL(TLSO) Lumbar Assessment Lumbar Assessment: Exceptions to WFL(TLSO, posterior pelvic tilt) Postural Control Postural Control: Deficits on evaluation Righting Reactions: delayed & limited 2/2 precautions Protective Responses: delayed & limited 2/2 precautions   Balance Balance Balance Assessed: Yes Dynamic Sitting Balance Dynamic Sitting - Balance Support: Feet supported Dynamic Sitting - Level of Assistance: 5: Stand by assistance Static & Dynamic standing balance: Support: During functional activity;Bilateral upper extremity supported - Level of Assistance: 5: Stand by assistance   Extremity Assessment  BUE WFL  LLE AROM WFL (knee & hip joint, ankle not assessed 2/2 cam boot & precautions) RLE AROM WFL, grossly 3+/5 as pt able to weight bear without buckling noted   Waunita Schooner 12/25/2018, 11:15 AM

## 2018-12-25 NOTE — Progress Notes (Signed)
Occupational Therapy Discharge Summary  Patient Details  Name: Amanda [name] MRN: [id number] Date of Birth: 08-Mar-1940  Today's Date: 12/25/2018 OT Individual Time: 1300-1410 OT Individual Time Calculation (min): 70 min    Patient has met 10 of 10 long term goals due to improved activity tolerance, improved balance, postural control, ability to compensate for deficits, functional use of  RIGHT upper, RIGHT lower, LEFT upper and LEFT lower extremity, improved attention, improved awareness and improved coordination.  Patient to discharge at overall Supervision level.  Patient's care partner is independent to provide the necessary physical and cognitive assistance at discharge.    Reasons goals not met: N/A  Recommendation:  Patient will benefit from ongoing skilled OT services in home health setting to continue to advance functional skills in the area of BADL and iADL.  Equipment: No equipment provided  Reasons for discharge: treatment goals met  Patient/family agrees with progress made and goals achieved: Yes   Skilled OT Treatment:  Pt received in bed supine with no c/o pain but reporting nausea; able to eat ice cream from lunch tray and OT offered drink. Pt reports after <5 mins nausea has decreased and is ready for tx. Pt completes supine <> EOB transfer with (S) to don TLSO with (S). Pt complete squat pivot transfer from EOB <> w/c with (S) and self propels to sink to complete oral care with (S). Pt husband arrives to complete FAMEDU. Pt completed w/c <> toilet squat pivot transfer with (S), husband and pt educated on w/c management and TDWB for L foot during transfer to toilet. Pt and husband verbalized understanding. Husband educated on back precautions and assisted pt to don TLSO from w/c. Husband able to verbalize and demonstrate understanding. Pt transported to therapy car to practice simulated car transfer; OT educated on w/c placement, safe RW management, additional AE (ie: handy  bar) and leg management into car. Pt completes with (S) and husband practiced supervising car transfer. In therapy apartment, husband practices w/c set up near bed and supervises pt completing squat pivot transfer to bed. Husband educated on log rolling technique and proper pt body mechanics to maintain back precautions when transferring supine <> EOB. Pt and husband educated on energy conservation strategies for home and OT provided handout. Exited session with pt in bed, bed alarm set and all needs met.   OT Discharge Precautions/Restrictions  Precautions Precautions: Fall;Back Precaution Comments: TLSO when OOB,  TDWB on LLE for transfers only Required Braces or Orthoses: Other Brace Splint/Cast: CAM boot Other Brace: TLSO when OOB Restrictions Weight Bearing Restrictions: Yes LLE Weight Bearing: Touchdown weight bearing Other Position/Activity Restrictions: TDWB LLE for transfers only General PT Missed Treatment Reason: Pain;Patient fatigue  Pain Pain Assessment Pain Scale: 0-10 Pain Score: 2  ADL ADL Eating: Modified independent Where Assessed-Eating: Bed level Grooming: Supervision/safety Where Assessed-Grooming: Sitting at sink, Wheelchair Upper Body Bathing: Supervision/safety Where Assessed-Upper Body Bathing: Shower Lower Body Bathing: Supervision/safety Where Assessed-Lower Body Bathing: Shower Upper Body Dressing: Supervision/safety Where Assessed-Upper Body Dressing: Edge of bed Lower Body Dressing: Supervision/safety Where Assessed-Lower Body Dressing: Edge of bed Toileting: Supervision/safety Where Assessed-Toileting: Glass blower/designer: Close supervision Toilet Transfer Method: Stand pivot Tub/Shower Transfer: Close supervison Clinical cytogeneticist Method: Psychologist, educational: Close supervision Social research officer, government Method: Stand pivot Vision Baseline Vision/History: Wears glasses Wears Glasses: At all times Patient Visual Report: No  change from baseline Perception  Perception: Within Functional Limits Praxis Praxis: Intact Cognition Overall Cognitive Status: Within Functional Limits for  tasks assessed Arousal/Alertness: Awake/alert Orientation Level: Oriented X4 Attention: Focused;Sustained;Selective Focused Attention: Appears intact Sustained Attention: Appears intact Selective Attention: Appears intact Memory: Appears intact Safety/Judgment: Appears intact Sensation Sensation Light Touch: Appears Intact Proprioception: Appears Intact Coordination Gross Motor Movements are Fluid and Coordinated: No Fine Motor Movements are Fluid and Coordinated: Yes Motor  Motor Motor: Abnormal postural alignment and control Motor - Discharge Observations: generalized deconditioning Mobility  Bed Mobility Bed Mobility: Rolling Right;Rolling Left;Right Sidelying to Sit;Sit to Sidelying Right Rolling Right: Supervision/verbal cueing Rolling Left: Supervision/Verbal cueing Right Sidelying to Sit: Supervision/Verbal cueing Supine to Sit: Supervision/Verbal cueing Sit to Sidelying Right: Supervision/Verbal cueing Transfers Sit to Stand: Supervision/Verbal cueing Stand to Sit: Supervision/Verbal cueing  Trunk/Postural Assessment  Cervical Assessment Cervical Assessment: Within Functional Limits Thoracic Assessment Thoracic Assessment: Exceptions to WFL(TLSO) Lumbar Assessment Lumbar Assessment: Exceptions to WFL(Posterior pelvid tilt) Postural Control Postural Control: Deficits on evaluation  Balance Balance Balance Assessed: Yes Dynamic Sitting Balance Dynamic Sitting - Balance Support: Feet supported Dynamic Sitting - Level of Assistance: 6: Modified independent (Device/Increase time) Extremity/Trunk Assessment RUE Assessment RUE Assessment: Within Functional Limits LUE Assessment LUE Assessment: Within Functional Limits   Reika Callanan 12/25/2018, 2:45 PM

## 2018-12-25 NOTE — Discharge Instructions (Signed)
Inpatient Rehab Discharge Instructions  Amanda [name] Discharge date and time: No discharge date for patient encounter.   Activities/Precautions/ Functional Status: Activity: Touchdown weightbearing left lower extremity.  Back brace when out of bed Diet: regular diet Wound Care: keep wound clean and dry Functional status:  ___ No restrictions     ___ Walk up steps independently ___ 24/7 supervision/assistance   ___ Walk up steps with assistance ___ Intermittent supervision/assistance  ___ Bathe/dress independently ___ Walk with walker     _x__ Bathe/dress with assistance ___ Walk Independently    ___ Shower independently ___ Walk with assistance    ___ Shower with assistance ___ No alcohol     ___ Return to work/school ________    COMMUNITY REFERRALS UPON DISCHARGE:    Home Health:   PT     OT                         Agency:  [facility]       Phone:  [phone]   Medical Equipment/Items Ordered:  Wheelchair, cushion, walker, tub bench                                                      Agency/Supplier:  [facility] @ [phone]   Special Instructions: No driving smoking or alcohol   My questions have been answered and I understand these instructions. I will adhere to these goals and the provided educational materials after my discharge from the hospital.  Patient/Caregiver Signature _______________________________ Date __________  Clinician Signature _______________________________________ Date __________  Please bring this form and your medication list with you to all your follow-up doctor's appointments.

## 2018-12-25 NOTE — Progress Notes (Signed)
[place] PHYSICAL MEDICINE & REHABILITATION PROGRESS NOTE   Subjective/Complaints: No new complaints.   ROS: Patient denies fever, rash, sore throat, blurred vision, nausea, vomiting, diarrhea, cough, shortness of breath or chest pain,  headache, or mood change.    Objective:   No results found. No results for input(s): WBC, HGB, HCT, PLT in the last 72 hours. No results for input(s): NA, K, CL, CO2, GLUCOSE, BUN, CREATININE, CALCIUM in the last 72 hours.  Intake/Output Summary (Last 24 hours) at 12/25/2018 1102 Last data filed at 12/25/2018 0745 Gross per 24 hour  Intake 342 ml  Output -  Net 342 ml     Physical Exam: Vital Signs Blood pressure (!) 171/79, pulse 72, temperature 98.2 F (36.8 C), resp. rate 18, height 5\' 1"  (1.549 m), weight 63.8 kg, SpO2 96 %. Constitutional: No distress . Vital signs reviewed. HEENT: EOMI, oral membranes moist Neck: supple Cardiovascular: RRR without murmur. No JVD    Respiratory: CTA Bilaterally without wheezes or rales. Normal effort    GI: BS +, non-tender, non-distended  Skin: abrasions improving Scattered bruising improved, left ankle wounds CDI Psych: Normal mood.  Normal behavior. Musc: bruising right ankle. Tenderness along right chest wall   Neurological: Alert Motor: RUE: 4/5 proximal distal with some pain inhibition, unchanged Right lower extremity: 4/5 proximal distal   Left lower extremity: 4/5 hip flexion, 4+/5 knee extension, ADF/PF -    Assessment/Plan: 1. Functional deficits secondary to polytrauma which require 3+ hours per day of interdisciplinary therapy in a comprehensive inpatient rehab setting.  Physiatrist is providing close team supervision and 24 hour management of active medical problems listed below.  Physiatrist and rehab team continue to assess barriers to discharge/monitor patient progress toward functional and medical goals  Care Tool:  Bathing    Body parts bathed by patient: Right arm, Left  arm, Chest, Abdomen, Front perineal area, Right upper leg, Left upper leg, Right lower leg, Left lower leg, Face   Body parts bathed by helper: Buttocks     Bathing assist Assist Level: Minimal Assistance - Patient > 75%     Upper Body Dressing/Undressing Upper body dressing   What is the patient wearing?: Pull over shirt, Orthosis    Upper body assist Assist Level: Supervision/Verbal cueing    Lower Body Dressing/Undressing Lower body dressing      What is the patient wearing?: Pants, Underwear/pull up     Lower body assist Assist for lower body dressing: Supervision/Verbal cueing     Toileting Toileting    Toileting assist Assist for toileting: Supervision/Verbal cueing     Transfers Chair/bed transfer  Transfers assist  Chair/bed transfer activity did not occur: Safety/medical concerns  Chair/bed transfer assist level: Supervision/Verbal cueing     Locomotion Ambulation   Ambulation assist   Ambulation activity did not occur: Safety/medical concerns  Assist level: Moderate Assistance - Patient 50 - 74% Assistive device: Walker-rolling Max distance: 51ft   Walk 10 feet activity   Assist  Walk 10 feet activity did not occur: Safety/medical concerns        Walk 50 feet activity   Assist Walk 50 feet with 2 turns activity did not occur: Safety/medical concerns         Walk 150 feet activity   Assist Walk 150 feet activity did not occur: Safety/medical concerns         Walk 10 feet on uneven surface  activity   Assist Walk 10 feet on uneven surfaces activity did not  occur: Safety/medical concerns         Wheelchair     Assist Will patient use wheelchair at discharge?: Yes Type of Wheelchair: Manual    Wheelchair assist level: Supervision/Verbal cueing Max wheelchair distance: >150 ft    Wheelchair 50 feet with 2 turns activity    Assist    Wheelchair 50 feet with 2 turns activity did not occur: Safety/medical  concerns(fatigue, nausea, SOB)   Assist Level: Supervision/Verbal cueing   Wheelchair 150 feet activity     Assist  Wheelchair 150 feet activity did not occur: Safety/medical concerns(fatigue, nausea, SOB)   Assist Level: Supervision/Verbal cueing   Blood pressure (!) 171/79, pulse 72, temperature 98.2 F (36.8 C), resp. rate 18, height 5\' 1"  (1.549 m), weight 63.8 kg, SpO2 96 %.  Medical Problem List and Plan: 1.  Decreased functional mobility secondary to left trimalleolar ankle fracture status post ORIF and closure of left knee laceration 12/12/2018     Sternal fracture, multiple rib fractures, right breast hematoma, abdominal wall hematoma L1, L2 and L4 vertebral body fractures with L1 superior endplate compression fracture-soft backpack TLSO brace   -TDWB LLE  Continue CIR              -ELOS 10/24  -Patient to see Rehab MD/provider in the office for transitional care encounter in 1-2 weeks.  2.  Antithrombotics: -DVT/anticoagulation: Lovenox.  vascular study negative             -antiplatelet therapy: Aspirin 81 mg daily 3. Pain Management: Lidoderm patch, Ultram 50 mg every 6 hours, oxycodone as needed             Monitor with increased activity.  -may use lidoderm patch for chest wall pain  Relatively controlled on 10/23  -likely suffered a right ankle sprain with accident.     -splint if needed   -xrays negative 4. Mood: Wellbutrin 150 mg daily             -antipsychotic agents: N/A 5. Neuropsych: This patient is capable of making decisions on her own behalf. 6. Skin/Wound Care: foam dressings to ankle wounds.  -POD#12 today from ankle surgery--consider removing staples before discharge.  7. Fluids/Electrolytes/Nutrition: encourage PO, appetite not great  -continue protein supps for low albumin 8.  Acute blood loss anemia.   Hemoglobin 8.2 on 10/15--->9.5 10/19 9.  Hypothyroidism.  Cont Synthroid.  10.  Lymphoma.  Chemotherapy recently completed. 11.  Drug induced  constipation.  Laxative assistance, continue senokot-s bid  +bm with sorbitol  -nausea improved 12. 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 13. Urinary frequency:    -denies baseline issues  -sl improvement  -ua negative, ucx  unremarkable 14. HTN:  Norvasc 5mg  daily  bp borderline---will not change prior to discharge, titrate further if needed as outpt once pain is less. 15.  Thrombocytopenia/pancytopenia  Platelets 131 on 10/15---293    -wbcs 2.6     LOS: 9 days A FACE TO FACE EVALUATION WAS PERFORMED  Amanda [name] 12/25/2018, 11:02 AM

## 2018-12-25 NOTE — Progress Notes (Signed)
Social Work Discharge Note   The overall goal for the admission was met for:   Discharge location: Yes - pt and spouse to stay with daughter in [place] for a few weeks initially  Length of Stay: Yes - 10 days (with discharge on 10/24)  Discharge activity level: Yes- supervision  Home/community participation: Yes  Services provided included: MD, RD, PT, OT, RN, Pharmacy and [place]: Medicare and Private Insurance: [employer]  Follow-up services arranged: Home Health: PT, OT via [facility] office), DME: 18x18 lightweight w/c with ELRs, cushion, rolling walker and tub bench via [employer] and Patient/Family has no preference for HH/DME agencies  Comments (or additional information):    Best contact, spouse, Aveah Castell @ [phone]  Patient/Family verbalized understanding of follow-up arrangements: Yes  Individual responsible for coordination of the follow-up plan: pt  Confirmed correct DME delivered: Tim Wilhide 12/25/2018    Kamille Toomey

## 2018-12-26 NOTE — Progress Notes (Signed)
[place] PHYSICAL MEDICINE & REHABILITATION PROGRESS NOTE   Subjective/Complaints: In good spirits. Excited about discharge. Ride not here until 3pm  ROS: Patient denies fever, rash, sore throat, blurred vision, nausea, vomiting, diarrhea, cough, shortness of breath or chest pain,  headache, or mood change.   Objective:   No results found. No results for input(s): WBC, HGB, HCT, PLT in the last 72 hours. No results for input(s): NA, K, CL, CO2, GLUCOSE, BUN, CREATININE, CALCIUM in the last 72 hours.  Intake/Output Summary (Last 24 hours) at 12/26/2018 0928 Last data filed at 12/25/2018 1750 Gross per 24 hour  Intake 240 ml  Output -  Net 240 ml     Physical Exam: Vital Signs Blood pressure (!) 164/78, pulse 69, temperature (!) 97.5 F (36.4 C), temperature source Oral, resp. rate 18, height 5\' 1"  (1.549 m), weight 63.8 kg, SpO2 97 %. Constitutional: No distress . Vital signs reviewed. HEENT: EOMI, oral membranes moist Neck: supple Cardiovascular: RRR without murmur. No JVD    Respiratory: CTA Bilaterally without wheezes or rales. Normal effort    GI: BS +, non-tender, non-distended  Skin: abrasions improving Scattered bruising improved, left ankle wounds CDI with sutures Psych: Normal mood.  Normal behavior. Musc: bruising right ankle. Tenderness along right chest wall   Neurological: Alert Motor: RUE: 4/5 proximal distal with some pain inhibition, unchanged Right lower extremity: 4/5 proximal distal   Left lower extremity: 4/5 hip flexion, 4+/5 knee extension, ADF/PF - stable   Assessment/Plan: 1. Functional deficits secondary to polytrauma which require 3+ hours per day of interdisciplinary therapy in a comprehensive inpatient rehab setting.  Physiatrist is providing close team supervision and 24 hour management of active medical problems listed below.  Physiatrist and rehab team continue to assess barriers to discharge/monitor patient progress toward functional  and medical goals  Care Tool:  Bathing    Body parts bathed by patient: Right arm, Left arm, Chest, Abdomen, Front perineal area, Right upper leg, Left upper leg, Right lower leg, Left lower leg, Face   Body parts bathed by helper: Buttocks     Bathing assist Assist Level: Minimal Assistance - Patient > 75%     Upper Body Dressing/Undressing Upper body dressing   What is the patient wearing?: Pull over shirt, Orthosis    Upper body assist Assist Level: Supervision/Verbal cueing    Lower Body Dressing/Undressing Lower body dressing      What is the patient wearing?: Pants, Underwear/pull up     Lower body assist Assist for lower body dressing: Supervision/Verbal cueing     Toileting Toileting    Toileting assist Assist for toileting: Supervision/Verbal cueing     Transfers Chair/bed transfer  Transfers assist  Chair/bed transfer activity did not occur: Safety/medical concerns  Chair/bed transfer assist level: Supervision/Verbal cueing     Locomotion Ambulation   Ambulation assist   Ambulation activity did not occur: Safety/medical concerns  Assist level: Moderate Assistance - Patient 50 - 74% Assistive device: Walker-rolling Max distance: 30ft   Walk 10 feet activity   Assist  Walk 10 feet activity did not occur: Safety/medical concerns        Walk 50 feet activity   Assist Walk 50 feet with 2 turns activity did not occur: Safety/medical concerns         Walk 150 feet activity   Assist Walk 150 feet activity did not occur: Safety/medical concerns         Walk 10 feet on uneven surface  activity  Assist Walk 10 feet on uneven surfaces activity did not occur: Safety/medical concerns         Wheelchair     Assist Will patient use wheelchair at discharge?: Yes Type of Wheelchair: Manual    Wheelchair assist level: Supervision/Verbal cueing Max wheelchair distance: >150 ft    Wheelchair 50 feet with 2 turns  activity    Assist    Wheelchair 50 feet with 2 turns activity did not occur: Safety/medical concerns(fatigue, nausea, SOB)   Assist Level: Supervision/Verbal cueing   Wheelchair 150 feet activity     Assist  Wheelchair 150 feet activity did not occur: Safety/medical concerns(fatigue, nausea, SOB)   Assist Level: Supervision/Verbal cueing   Blood pressure (!) 164/78, pulse 69, temperature (!) 97.5 F (36.4 C), temperature source Oral, resp. rate 18, height 5\' 1"  (1.549 m), weight 63.8 kg, SpO2 97 %.  Medical Problem List and Plan: 1.  Decreased functional mobility secondary to left trimalleolar ankle fracture status post ORIF and closure of left knee laceration 12/12/2018     Sternal fracture, multiple rib fractures, right breast hematoma, abdominal wall hematoma L1, L2 and L4 vertebral body fractures with L1 superior endplate compression fracture-soft backpack TLSO brace   -TDWB LLE  -dc home today  -Patient to see Rehab MD/provider in the office for transitional care encounter in 1-2 weeks.  2.  Antithrombotics: -DVT/anticoagulation: Lovenox.  vascular study negative             -antiplatelet therapy: Aspirin 81 mg daily 3. Pain Management: Lidoderm patch, Ultram 50 mg every 6 hours, oxycodone as needed             Monitor with increased activity.  -may use lidoderm patch for chest wall pain  Relatively controlled on 10/23  -likely suffered a right ankle sprain with accident.     -splint if needed   -xrays negative 4. Mood: Wellbutrin 150 mg daily             -antipsychotic agents: N/A 5. Neuropsych: This patient is capable of making decisions on her own behalf. 6. Skin/Wound Care: foam dressings to ankle wounds.  -POD#13 today from ankle surgery--remove sutures 7. Fluids/Electrolytes/Nutrition: encourage PO, appetite not great  -continue protein supps for low albumin 8.  Acute blood loss anemia.   Hemoglobin 8.2 on 10/15--->9.5 10/19 9.  Hypothyroidism.  Cont  Synthroid.  10.  Lymphoma.  Chemotherapy recently completed. 11.  Drug induced constipation.  Laxative assistance, continue senokot-s bid  Moved bowels 10/22  -nausea improved 12. 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 13. Urinary frequency:    -denies baseline issues  -sl improvement  -ua negative, ucx  unremarkable 14. HTN:  Norvasc 5mg  daily  bp borderline--may need titration of norvasc as outpt 15.  Thrombocytopenia/pancytopenia  Platelets 131 on 10/15---293    -wbcs 2.6     LOS: 10 days A FACE TO FACE EVALUATION WAS PERFORMED  Amanda [name] 12/26/2018, 9:28 AM

## 2018-12-27 ENCOUNTER — Encounter: Payer: Self-pay | Admitting: Family Medicine

## 2018-12-27 DIAGNOSIS — Z8572 Personal history of non-Hodgkin lymphomas: Secondary | ICD-10-CM | POA: Diagnosis not present

## 2018-12-27 DIAGNOSIS — S32028D Other fracture of second lumbar vertebra, subsequent encounter for fracture with routine healing: Secondary | ICD-10-CM | POA: Diagnosis not present

## 2018-12-27 DIAGNOSIS — S32048D Other fracture of fourth lumbar vertebra, subsequent encounter for fracture with routine healing: Secondary | ICD-10-CM | POA: Diagnosis not present

## 2018-12-27 DIAGNOSIS — S81012D Laceration without foreign body, left knee, subsequent encounter: Secondary | ICD-10-CM | POA: Diagnosis not present

## 2018-12-27 DIAGNOSIS — S2241XD Multiple fractures of ribs, right side, subsequent encounter for fracture with routine healing: Secondary | ICD-10-CM | POA: Diagnosis not present

## 2018-12-27 DIAGNOSIS — E039 Hypothyroidism, unspecified: Secondary | ICD-10-CM | POA: Diagnosis not present

## 2018-12-27 DIAGNOSIS — S82852D Displaced trimalleolar fracture of left lower leg, subsequent encounter for closed fracture with routine healing: Secondary | ICD-10-CM | POA: Diagnosis not present

## 2018-12-27 DIAGNOSIS — E0590 Thyrotoxicosis, unspecified without thyrotoxic crisis or storm: Secondary | ICD-10-CM | POA: Diagnosis not present

## 2018-12-27 DIAGNOSIS — I10 Essential (primary) hypertension: Secondary | ICD-10-CM | POA: Diagnosis not present

## 2018-12-27 DIAGNOSIS — S2220XD Unspecified fracture of sternum, subsequent encounter for fracture with routine healing: Secondary | ICD-10-CM | POA: Diagnosis not present

## 2018-12-27 DIAGNOSIS — S32018D Other fracture of first lumbar vertebra, subsequent encounter for fracture with routine healing: Secondary | ICD-10-CM | POA: Diagnosis not present

## 2018-12-28 ENCOUNTER — Telehealth: Payer: Self-pay

## 2018-12-28 ENCOUNTER — Encounter: Payer: Self-pay | Admitting: Family Medicine

## 2018-12-28 ENCOUNTER — Other Ambulatory Visit: Payer: Self-pay | Admitting: Family Medicine

## 2018-12-28 DIAGNOSIS — S32028D Other fracture of second lumbar vertebra, subsequent encounter for fracture with routine healing: Secondary | ICD-10-CM | POA: Diagnosis not present

## 2018-12-28 DIAGNOSIS — S82852D Displaced trimalleolar fracture of left lower leg, subsequent encounter for closed fracture with routine healing: Secondary | ICD-10-CM | POA: Diagnosis not present

## 2018-12-28 DIAGNOSIS — S2220XD Unspecified fracture of sternum, subsequent encounter for fracture with routine healing: Secondary | ICD-10-CM | POA: Diagnosis not present

## 2018-12-28 DIAGNOSIS — S2241XD Multiple fractures of ribs, right side, subsequent encounter for fracture with routine healing: Secondary | ICD-10-CM | POA: Diagnosis not present

## 2018-12-28 DIAGNOSIS — K121 Other forms of stomatitis: Secondary | ICD-10-CM

## 2018-12-28 DIAGNOSIS — S32048D Other fracture of fourth lumbar vertebra, subsequent encounter for fracture with routine healing: Secondary | ICD-10-CM | POA: Diagnosis not present

## 2018-12-28 DIAGNOSIS — S32018D Other fracture of first lumbar vertebra, subsequent encounter for fracture with routine healing: Secondary | ICD-10-CM | POA: Diagnosis not present

## 2018-12-28 MED ORDER — MAGIC MOUTHWASH W/LIDOCAINE: 5.0000 mL | Freq: Four times a day (QID) | ORAL | 0 refills | Status: DC | PRN

## 2018-12-28 NOTE — Telephone Encounter (Signed)
The magic mouthwash printed in my office ---- it will need to be faxed to pharmacy-----  Is she going to want it faxed to a pharmacy in [place]?

## 2018-12-28 NOTE — Telephone Encounter (Signed)
Beth PT [facility] called requesting verbal orders for HHPT 3wk1 followed by 2wk1.  Called her back and approved verbal orders.  In addition, patient left hospital with dressings over several wounds.  Home Health has asked for How long does the dressings need to stay in place or are they supposed to be changed as they were not mentioned in the discharge paperwork.

## 2018-12-29 ENCOUNTER — Telehealth: Payer: Self-pay

## 2018-12-29 DIAGNOSIS — S2241XD Multiple fractures of ribs, right side, subsequent encounter for fracture with routine healing: Secondary | ICD-10-CM | POA: Diagnosis not present

## 2018-12-29 DIAGNOSIS — S32048D Other fracture of fourth lumbar vertebra, subsequent encounter for fracture with routine healing: Secondary | ICD-10-CM | POA: Diagnosis not present

## 2018-12-29 DIAGNOSIS — S32018D Other fracture of first lumbar vertebra, subsequent encounter for fracture with routine healing: Secondary | ICD-10-CM | POA: Diagnosis not present

## 2018-12-29 DIAGNOSIS — S2220XD Unspecified fracture of sternum, subsequent encounter for fracture with routine healing: Secondary | ICD-10-CM | POA: Diagnosis not present

## 2018-12-29 DIAGNOSIS — S82852D Displaced trimalleolar fracture of left lower leg, subsequent encounter for closed fracture with routine healing: Secondary | ICD-10-CM | POA: Diagnosis not present

## 2018-12-29 DIAGNOSIS — S32028D Other fracture of second lumbar vertebra, subsequent encounter for fracture with routine healing: Secondary | ICD-10-CM | POA: Diagnosis not present

## 2018-12-29 NOTE — Telephone Encounter (Signed)
Contacted HHPT, passed on Dr. Charm Barges intructions. HHPT verbalized understanding

## 2018-12-29 NOTE — Telephone Encounter (Signed)
Transitional Care call-[name]-spouse    1. Are you/is patient experiencing any problems since coming home? No Are there any questions regarding any aspect of care? No 2. Are there any questions regarding medications administration/dosing? Insurance will not pay for Lidocaine patches, told spouse to have pharmacy send a PA and in the meantime can use [employer]on[facility]  Are meds being taken as prescribed? Yes Patient should review meds with caller to confirm 3. Have there been any falls? No 4. Has Home Health been to the house and/or have they contacted you? Yes If not, have you tried to contact them? Can we help you contact them? 5. Are bowels and bladder emptying properly? Yes Are there any unexpected incontinence issues? No If applicable, is patient following bowel/bladder programs? 6. Any fevers, problems with breathing, unexpected pain? No 7. Are there any skin problems or new areas of breakdown? No 8. Has the patient/family member arranged specialty MD follow up (ie cardiology/neurology/renal/surgical/etc)? Yes Can we help arrange? 9. Does the patient need any other services or support that we can help arrange? No 10. Are caregivers following through as expected in assisting the patient? Yes 11. Has the patient quit smoking, drinking alcohol, or using drugs as recommended? Yes  Appointment time 2:40 pm arrive time 2:20 pm with Danella Sensing, NP [place]

## 2018-12-29 NOTE — Telephone Encounter (Signed)
Most of wounds had foam dressings. These were all healing nicely. The dressings can be removed and left open to air. If there is any drainage from a wound, it can be covered with a dry dressing

## 2018-12-31 ENCOUNTER — Encounter: Payer: Self-pay | Admitting: Orthopedic Surgery

## 2018-12-31 DIAGNOSIS — S2241XD Multiple fractures of ribs, right side, subsequent encounter for fracture with routine healing: Secondary | ICD-10-CM | POA: Diagnosis not present

## 2018-12-31 DIAGNOSIS — S32018D Other fracture of first lumbar vertebra, subsequent encounter for fracture with routine healing: Secondary | ICD-10-CM | POA: Diagnosis not present

## 2018-12-31 DIAGNOSIS — S82852D Displaced trimalleolar fracture of left lower leg, subsequent encounter for closed fracture with routine healing: Secondary | ICD-10-CM | POA: Diagnosis not present

## 2018-12-31 DIAGNOSIS — S2220XD Unspecified fracture of sternum, subsequent encounter for fracture with routine healing: Secondary | ICD-10-CM | POA: Diagnosis not present

## 2018-12-31 DIAGNOSIS — S32048D Other fracture of fourth lumbar vertebra, subsequent encounter for fracture with routine healing: Secondary | ICD-10-CM | POA: Diagnosis not present

## 2018-12-31 DIAGNOSIS — S32028D Other fracture of second lumbar vertebra, subsequent encounter for fracture with routine healing: Secondary | ICD-10-CM | POA: Diagnosis not present

## 2018-12-31 NOTE — Telephone Encounter (Signed)
At this point okay to use walker please call thanks she needs to come back in the near future so we can look at her ankle x-rays again in the incision.

## 2019-01-01 ENCOUNTER — Encounter: Payer: Self-pay | Admitting: Orthopedic Surgery

## 2019-01-01 NOTE — Telephone Encounter (Signed)
Touchdown weightbearing for transfers in the boot.  Otherwise okay for ankle range of motion out of the boot.  When laying in bed.  I would not walk on the fracture until we see her back for her first postop visit.

## 2019-01-04 ENCOUNTER — Telehealth: Payer: Self-pay | Admitting: *Deleted

## 2019-01-04 NOTE — Telephone Encounter (Signed)
Patient's husband left a message asking if Dr. Naaman Plummer would refill patients oxycodone. She is down to her last 3 tablets  Patient is doing outpatient rehab in [place], [place] and they would like the script sent to ([place] [place] [facility] [place]). If at all possible.  She has an appt with Dr. Naaman Plummer on Wednesday the 4th of November. I have added the pharmacy to the patients list.  It will need to be selected to ensure that the script goes to the right pharmacy.

## 2019-01-04 NOTE — Telephone Encounter (Signed)
Refill sent.

## 2019-01-05 DIAGNOSIS — S32028D Other fracture of second lumbar vertebra, subsequent encounter for fracture with routine healing: Secondary | ICD-10-CM | POA: Diagnosis not present

## 2019-01-05 DIAGNOSIS — S2220XD Unspecified fracture of sternum, subsequent encounter for fracture with routine healing: Secondary | ICD-10-CM | POA: Diagnosis not present

## 2019-01-05 DIAGNOSIS — S82852D Displaced trimalleolar fracture of left lower leg, subsequent encounter for closed fracture with routine healing: Secondary | ICD-10-CM | POA: Diagnosis not present

## 2019-01-05 DIAGNOSIS — S2241XD Multiple fractures of ribs, right side, subsequent encounter for fracture with routine healing: Secondary | ICD-10-CM | POA: Diagnosis not present

## 2019-01-05 DIAGNOSIS — S32048D Other fracture of fourth lumbar vertebra, subsequent encounter for fracture with routine healing: Secondary | ICD-10-CM | POA: Diagnosis not present

## 2019-01-05 DIAGNOSIS — S32018D Other fracture of first lumbar vertebra, subsequent encounter for fracture with routine healing: Secondary | ICD-10-CM | POA: Diagnosis not present

## 2019-01-05 MED ORDER — OXYCODONE HCL 5 MG PO TABS: 5.0000 mg | ORAL_TABLET | ORAL | 0 refills | Status: DC | PRN

## 2019-01-05 NOTE — Telephone Encounter (Signed)
RF actually sent this morning. (E-rx finally worked)

## 2019-01-06 ENCOUNTER — Encounter: Payer: Medicare Other | Attending: Registered Nurse | Admitting: Registered Nurse

## 2019-01-06 ENCOUNTER — Encounter: Payer: Self-pay | Admitting: Registered Nurse

## 2019-01-06 ENCOUNTER — Encounter: Payer: Self-pay | Admitting: Orthopedic Surgery

## 2019-01-06 ENCOUNTER — Ambulatory Visit (INDEPENDENT_AMBULATORY_CARE_PROVIDER_SITE_OTHER): Payer: Medicare Other | Admitting: Orthopedic Surgery

## 2019-01-06 ENCOUNTER — Ambulatory Visit (INDEPENDENT_AMBULATORY_CARE_PROVIDER_SITE_OTHER): Payer: Medicare Other

## 2019-01-06 ENCOUNTER — Other Ambulatory Visit: Payer: Self-pay

## 2019-01-06 VITALS — BP 151/85 | HR 72

## 2019-01-06 DIAGNOSIS — Z803 Family history of malignant neoplasm of breast: Secondary | ICD-10-CM | POA: Diagnosis not present

## 2019-01-06 DIAGNOSIS — Z833 Family history of diabetes mellitus: Secondary | ICD-10-CM | POA: Insufficient documentation

## 2019-01-06 DIAGNOSIS — Z8041 Family history of malignant neoplasm of ovary: Secondary | ICD-10-CM | POA: Diagnosis not present

## 2019-01-06 DIAGNOSIS — Z8042 Family history of malignant neoplasm of prostate: Secondary | ICD-10-CM | POA: Insufficient documentation

## 2019-01-06 DIAGNOSIS — Z8249 Family history of ischemic heart disease and other diseases of the circulatory system: Secondary | ICD-10-CM | POA: Diagnosis not present

## 2019-01-06 DIAGNOSIS — I10 Essential (primary) hypertension: Secondary | ICD-10-CM | POA: Diagnosis not present

## 2019-01-06 DIAGNOSIS — Z823 Family history of stroke: Secondary | ICD-10-CM | POA: Diagnosis not present

## 2019-01-06 DIAGNOSIS — T07XXXA Unspecified multiple injuries, initial encounter: Secondary | ICD-10-CM | POA: Insufficient documentation

## 2019-01-06 DIAGNOSIS — M47812 Spondylosis without myelopathy or radiculopathy, cervical region: Secondary | ICD-10-CM | POA: Diagnosis not present

## 2019-01-06 DIAGNOSIS — S82892S Other fracture of left lower leg, sequela: Secondary | ICD-10-CM

## 2019-01-06 DIAGNOSIS — R110 Nausea: Secondary | ICD-10-CM

## 2019-01-06 MED ORDER — ONDANSETRON HCL 4 MG PO TABS: 4.0000 mg | ORAL_TABLET | Freq: Three times a day (TID) | ORAL | 0 refills | Status: DC | PRN
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
## 2019-01-08 ENCOUNTER — Encounter: Payer: Self-pay | Admitting: Orthopedic Surgery

## 2019-01-08 DIAGNOSIS — S32028D Other fracture of second lumbar vertebra, subsequent encounter for fracture with routine healing: Secondary | ICD-10-CM | POA: Diagnosis not present

## 2019-01-08 DIAGNOSIS — S2220XD Unspecified fracture of sternum, subsequent encounter for fracture with routine healing: Secondary | ICD-10-CM | POA: Diagnosis not present

## 2019-01-08 DIAGNOSIS — S2241XD Multiple fractures of ribs, right side, subsequent encounter for fracture with routine healing: Secondary | ICD-10-CM | POA: Diagnosis not present

## 2019-01-08 DIAGNOSIS — S82852D Displaced trimalleolar fracture of left lower leg, subsequent encounter for closed fracture with routine healing: Secondary | ICD-10-CM | POA: Diagnosis not present

## 2019-01-08 DIAGNOSIS — S32018D Other fracture of first lumbar vertebra, subsequent encounter for fracture with routine healing: Secondary | ICD-10-CM | POA: Diagnosis not present

## 2019-01-08 DIAGNOSIS — S32048D Other fracture of fourth lumbar vertebra, subsequent encounter for fracture with routine healing: Secondary | ICD-10-CM | POA: Diagnosis not present
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
## 2019-01-12 ENCOUNTER — Telehealth: Payer: Self-pay | Admitting: *Deleted

## 2019-01-12 DIAGNOSIS — T07XXXA Unspecified multiple injuries, initial encounter: Secondary | ICD-10-CM

## 2019-01-12 NOTE — Telephone Encounter (Signed)
Patient would like to transfer her HHPT and OT back to [place].  She had been living with her daughter in [place], [place] and doing theray over there.  [facility] have told the patient that a new referral order is needed to transfer care to [place] with [facility], OT and PT

## 2019-01-13 NOTE — Telephone Encounter (Signed)
Ordered referral to [facility]

## 2019-01-13 NOTE — Telephone Encounter (Signed)
Patient notified

## 2019-01-14 ENCOUNTER — Encounter: Payer: Self-pay | Admitting: Family Medicine

## 2019-01-14 ENCOUNTER — Telehealth: Payer: Self-pay

## 2019-01-14 NOTE — Telephone Encounter (Signed)
Patient called stating that she is in need of a new referral to be [facility] to [facility] that she is no longer living in [place] but needs to pick theripies back up in [place].

## 2019-01-14 NOTE — Telephone Encounter (Signed)
See previous mychart message from earlier today.

## 2019-01-14 NOTE — Telephone Encounter (Signed)
Spoke with pt and scheduled visit for 01/18/19 at 1:20pm.  COVID screening complete and pt advised to call prior to appt if she develops any illness symptoms and pt voices understanding.

## 2019-01-15 NOTE — Telephone Encounter (Signed)
See below:   Ordered referral to [facility]        Electronically signed by Meredith Staggers, MD at 01/13/2019 10:01 AM   Telephone on 01/12/2019     Detailed Report

## 2019-01-16 DIAGNOSIS — S2220XD Unspecified fracture of sternum, subsequent encounter for fracture with routine healing: Secondary | ICD-10-CM | POA: Diagnosis not present

## 2019-01-16 DIAGNOSIS — E785 Hyperlipidemia, unspecified: Secondary | ICD-10-CM | POA: Diagnosis not present

## 2019-01-16 DIAGNOSIS — K5909 Other constipation: Secondary | ICD-10-CM | POA: Diagnosis not present

## 2019-01-16 DIAGNOSIS — B029 Zoster without complications: Secondary | ICD-10-CM | POA: Diagnosis not present

## 2019-01-16 DIAGNOSIS — E039 Hypothyroidism, unspecified: Secondary | ICD-10-CM | POA: Diagnosis not present

## 2019-01-16 DIAGNOSIS — J479 Bronchiectasis, uncomplicated: Secondary | ICD-10-CM | POA: Diagnosis not present

## 2019-01-16 DIAGNOSIS — S2241XD Multiple fractures of ribs, right side, subsequent encounter for fracture with routine healing: Secondary | ICD-10-CM | POA: Diagnosis not present

## 2019-01-16 DIAGNOSIS — N3592 Unspecified urethral stricture, female: Secondary | ICD-10-CM | POA: Diagnosis not present

## 2019-01-16 DIAGNOSIS — S32029D Unspecified fracture of second lumbar vertebra, subsequent encounter for fracture with routine healing: Secondary | ICD-10-CM | POA: Diagnosis not present

## 2019-01-16 DIAGNOSIS — M8580 Other specified disorders of bone density and structure, unspecified site: Secondary | ICD-10-CM | POA: Diagnosis not present

## 2019-01-16 DIAGNOSIS — D630 Anemia in neoplastic disease: Secondary | ICD-10-CM | POA: Diagnosis not present

## 2019-01-16 DIAGNOSIS — K589 Irritable bowel syndrome without diarrhea: Secondary | ICD-10-CM | POA: Diagnosis not present

## 2019-01-16 DIAGNOSIS — K219 Gastro-esophageal reflux disease without esophagitis: Secondary | ICD-10-CM | POA: Diagnosis not present

## 2019-01-16 DIAGNOSIS — S82852D Displaced trimalleolar fracture of left lower leg, subsequent encounter for closed fracture with routine healing: Secondary | ICD-10-CM | POA: Diagnosis not present

## 2019-01-16 DIAGNOSIS — S32018D Other fracture of first lumbar vertebra, subsequent encounter for fracture with routine healing: Secondary | ICD-10-CM | POA: Diagnosis not present

## 2019-01-16 DIAGNOSIS — E0590 Thyrotoxicosis, unspecified without thyrotoxic crisis or storm: Secondary | ICD-10-CM | POA: Diagnosis not present

## 2019-01-16 DIAGNOSIS — C8590 Non-Hodgkin lymphoma, unspecified, unspecified site: Secondary | ICD-10-CM | POA: Diagnosis not present

## 2019-01-16 DIAGNOSIS — I129 Hypertensive chronic kidney disease with stage 1 through stage 4 chronic kidney disease, or unspecified chronic kidney disease: Secondary | ICD-10-CM | POA: Diagnosis not present

## 2019-01-16 DIAGNOSIS — S32049D Unspecified fracture of fourth lumbar vertebra, subsequent encounter for fracture with routine healing: Secondary | ICD-10-CM | POA: Diagnosis not present

## 2019-01-16 DIAGNOSIS — D631 Anemia in chronic kidney disease: Secondary | ICD-10-CM | POA: Diagnosis not present

## 2019-01-16 DIAGNOSIS — R32 Unspecified urinary incontinence: Secondary | ICD-10-CM | POA: Diagnosis not present

## 2019-01-16 DIAGNOSIS — M47816 Spondylosis without myelopathy or radiculopathy, lumbar region: Secondary | ICD-10-CM | POA: Diagnosis not present

## 2019-01-16 DIAGNOSIS — M47812 Spondylosis without myelopathy or radiculopathy, cervical region: Secondary | ICD-10-CM | POA: Diagnosis not present

## 2019-01-16 DIAGNOSIS — N183 Chronic kidney disease, stage 3 unspecified: Secondary | ICD-10-CM | POA: Diagnosis not present

## 2019-01-16 DIAGNOSIS — N393 Stress incontinence (female) (male): Secondary | ICD-10-CM | POA: Diagnosis not present

## 2019-01-18 ENCOUNTER — Ambulatory Visit (INDEPENDENT_AMBULATORY_CARE_PROVIDER_SITE_OTHER): Payer: Medicare Other | Admitting: Family Medicine

## 2019-01-18 ENCOUNTER — Telehealth: Payer: Self-pay

## 2019-01-18 ENCOUNTER — Encounter: Payer: Self-pay | Admitting: Family Medicine

## 2019-01-18 ENCOUNTER — Other Ambulatory Visit: Payer: Self-pay

## 2019-01-18 VITALS — BP 142/73 | HR 72 | Temp 96.0°F | Resp 12

## 2019-01-18 DIAGNOSIS — E785 Hyperlipidemia, unspecified: Secondary | ICD-10-CM | POA: Diagnosis not present

## 2019-01-18 DIAGNOSIS — C8592 Non-Hodgkin lymphoma, unspecified, intrathoracic lymph nodes: Secondary | ICD-10-CM | POA: Diagnosis not present

## 2019-01-18 DIAGNOSIS — S82892A Other fracture of left lower leg, initial encounter for closed fracture: Secondary | ICD-10-CM

## 2019-01-18 DIAGNOSIS — S2222XA Fracture of body of sternum, initial encounter for closed fracture: Secondary | ICD-10-CM

## 2019-01-18 MED ORDER — TRAMADOL HCL 50 MG PO TABS: 50.0000 mg | ORAL_TABLET | Freq: Four times a day (QID) | ORAL | 0 refills | Status: DC

## 2019-01-18 NOTE — Assessment & Plan Note (Signed)
Tolerating statin, encouraged heart healthy diet, avoid trans fats, minimize simple carbs and saturated fats. Increase exercise as tolerated 

## 2019-01-18 NOTE — Patient Instructions (Signed)
Sternal Fracture  A sternal fracture is a break in the bone in the center of the chest (sternum or breastbone). This type of fracture often causes pain that can get worse when you breathe deeply or cough. A sternal fracture is not dangerous unless there is also an injury to your heart or lungs, which are protected by the sternum and ribs. What are the causes? This condition is usually caused by a forceful injury from:  Motor vehicle accidents. This is the most common cause.  Contact sports.  Physical assaults.  Falls. You can also develop a sternal fracture without having a forceful injury if the bone becomes weakened over time (stress fracture or insufficiency fracture). What increases the risk? You are more likely to have a sternal fracture if you:  Participate in contact sports, such as football, lacrosse, wrestling, or martial arts.  Work at elevated heights, such as in construction. This increases your risk of a fall. The following factors may make you more likely to develop a stress fracture or insufficiency fracture:  Being female.  Being a postmenopausal woman.  Being 50 years of age or older.  Having weak bones (osteoporosis).  Having severe curvature of the spine.  Being on long-term steroid treatment. What are the signs or symptoms? Symptoms of this condition include:  Pain over the sternum or chest wall.  Tenderness of the sternum or chest wall.  Pain that gets worse when you breathe deeply or cough.  Shortness of breath.  A bruise (contusion) over the chest.  Swelling.  A crackling sound when taking a deep breath or pressing on the sternum. How is this diagnosed? This condition is diagnosed based on:  A physical exam.  Your medical history.  Tests, such as: ? Blood oxygen level. This is measured with a pulse oximetry test. ? Repeated electrocardiograms (ECGs). This is to make sure that your heart is not injured. ? A blood test. This is to check  for damage to your heart muscle. ? Imaging tests, such as:  A CT scan.  An ultrasound.  Chest X-rays. How is this treated? Treatment depends on the severity of your injury.  A sternal fracture without any other injury (isolated sternal fracture) usually heals without treatment. You may need to: ? Limit some activities at home. ? Take medicines for pain relief. ? Do deep breathing exercises to prevent injury and infection to your lungs.  In rare cases, surgery may be needed if a sternal fracture: ? Continues to cause severe pain. ? Causes shortness of breath or respiratory problems. ? Involves bones that have been moved too far out of position (displaced fracture). Follow these instructions at home: Managing pain, stiffness, and swelling   If directed, put ice on the injured area. ? Put ice in a plastic bag. ? Place a towel between your skin and the bag. ? Leave the ice on for 20 minutes, 2-3 times a day. Medicines  Take over-the-counter and prescription medicines only as told by your health care provider.  Ask your health care provider if the medicine prescribed to you: ? Requires you to avoid driving or using heavy machinery. ? Can cause constipation. You may need to take actions to prevent or treat constipation, such as:  Drink enough fluid to keep your urine pale yellow.  Take over-the-counter or prescription medicines.  Eat foods that are high in fiber, such as beans, whole grains, and fresh fruits and vegetables.  Limit foods that are high in fat and processed   sugars, such as fried or sweet foods. Activity  Rest at home.  Return to your normal activities as told by your health care provider. Ask your health care provider what activities are safe for you.  Do breathing exercises as told by your health care provider.  Do not push or pull with your arms when getting in and out of bed.  Do not lift anything that is heavier than 10 lb (4.5 kg), or the limit that  you are told, until your health care provider says that it is safe. General instructions  Hug a pillow when you sneeze, cough, or twist or bend at the waist. Doing this helps support your chest.  Do not use any products that contain nicotine or tobacco, such as cigarettes, e-cigarettes, and chewing tobacco. These can delay bone healing. If you need help quitting, ask your health care provider.  Keep all follow-up visits as told by your health care provider. This is important. Contact a health care provider if:  Your pain medicine is not helping.  You continue to have pain after several weeks.  You have swelling or bruising that gets worse.  You develop a fever or chills.  You develop a cough and you cough up thick or bloody mucus from your lungs (sputum). Get help right away if you:  Have difficulty breathing.  Have chest pain.  Feel light-headed.  Have fast or irregular heartbeats (palpitations).  Feel nauseous or have pain in your abdomen. Summary  A sternal fracture is a break in the bone in the center of the chest (sternum or breastbone).  This condition is usually caused by a forceful injury. The most common cause is motor vehicle accidents.  If directed, put ice on the injured area.  Return to your normal activities as told by your health care provider. Ask your health care provider what activities are safe for you. This information is not intended to replace advice given to you by your health care provider. Make sure you discuss any questions you have with your health care provider. Document Released: 10/03/2003 Document Revised: 02/19/2018 Document Reviewed: 02/19/2018 Elsevier Patient Education  2020 [employer]  

## 2019-01-18 NOTE — Assessment & Plan Note (Signed)
Healing well F/u with ortho

## 2019-01-18 NOTE — Assessment & Plan Note (Signed)
Per oncology °

## 2019-01-18 NOTE — Telephone Encounter (Signed)
Gracie, PT form [facility] called requesting verbal orders for HHPT 1wk2, 2wk3. Orders approved and given.

## 2019-01-18 NOTE — Progress Notes (Signed)
New Patient Office Visit  Subjective:  Patient ID: Amanda [name], female    DOB: Jul 03, 1940  Age: 78 y.o. MRN: [id number]  CC:  Chief Complaint  Patient presents with  . follow up accident  . Medication Refill    tramadol  . want to discuss chemo infusion tomorrow  . would like to look at ankle    left  . GERD meds    wants to know if need to take both pepcid and prilosec    HPI Amanda [name] presents for f/u accident and med refills  She is supposed to have chemo tomorrow.  She is concerned about that -- she does feel like she is up to it She also would like to come off the pantoprazole and just take the pepcid due to cost  Past Medical History:  Diagnosis Date  . Anemia   . Arthritis   . Cancer ([place])   . Constipation, chronic   . Essential hypertension   . GERD (gastroesophageal reflux disease)    zantac  . Heart murmur   . History of blood transfusion [place]  . Hyperlipidemia   . Hypertension   . Hyperthyroidism   . Hypothyroidism   . Lymphoproliferative disorder ([place])   . Macular degeneration 2013   Both eyes   . Osteopenia   . Pneumonia   . PONV (postoperative nausea and vomiting)    needs little anesthesia  . Shingles   . Shortness of breath    on exertion  . Spleen enlarged   . SUI (stress urinary incontinence, female)   . Wears glasses     Past Surgical History:  Procedure Laterality Date  . BREAST EXCISIONAL BIOPSY Left 1980  . CARPAL TUNNEL RELEASE  1999  . CATARACT EXTRACTION  2009, 2011   BOTH EYES  . CATARACT EXTRACTION, BILATERAL    . [place]  . CESAREAN SECTION    . COLONOSCOPY      Dr Cristina Gong  . DILATION AND CURETTAGE OF UTERUS     X2  . HYSTEROSCOPY W/D&C  01/07/2012   Procedure: DILATATION AND CURETTAGE /HYSTEROSCOPY;  Surgeon: Terrance Mass, MD;  Location: [facility];  Service: Gynecology;  Laterality: N/A;  intrauterine foley catheter for tamponode   . IR IMAGING GUIDED PORT INSERTION  07/15/2018  .  LYMPH NODE BIOPSY Left 05/26/2018   Procedure: LEFT AXILLARY LYMPH NODE BIOPSY;  Surgeon: Fanny Skates, MD;  Location: [facility];  Service: General;  Laterality: Left;  . ORIF ANKLE FRACTURE Left 12/12/2018  . ORIF ANKLE FRACTURE Left 12/12/2018   Procedure: OPEN REDUCTION INTERNAL FIXATION (ORIF) ANKLE FRACTURE;  Surgeon: Meredith Pel, MD;  Location: [facility];  Service: Orthopedics;  Laterality: Left;  . TONSILLECTOMY    . TONSILLECTOMY AND ADENOIDECTOMY    . TUBAL LIGATION     BY LAPAROSCOPY  . WISDOM TOOTH EXTRACTION      Family History  Problem Relation Age of Onset  . Ovarian cancer Mother   . Breast cancer Mother 63  . Hypertension Father   . Prostate cancer Father   . Kidney failure Father   . Diabetes Father   . Hyperlipidemia Brother   . COPD Paternal Grandfather   . Stroke Maternal Grandfather   . Hypercalcemia Neg Hx     Social History   Socioeconomic History  . Marital status: Married    Spouse name: Not on file  . Number of children: Not on file  .  Years of education: Not on file  . Highest education level: Not on file  Occupational History  . Not on file  Social Needs  . Financial resource strain: Not on file  . Food insecurity    Worry: Not on file    Inability: Not on file  . Transportation needs    Medical: Not on file    Non-medical: Not on file  Tobacco Use  . Smoking status: Never Smoker  . Smokeless tobacco: Never Used  Substance and Sexual Activity  . Alcohol use: Yes    Comment: RARE  . Drug use: Never  . Sexual activity: Never    Birth control/protection: Post-menopausal  Lifestyle  . Physical activity    Days per week: Not on file    Minutes per session: Not on file  . Stress: Not on file  Relationships  . Social Herbalist on phone: Not on file    Gets together: Not on file    Attends religious service: Not on file    Active member of club or organization: Not on file    Attends meetings of clubs or organizations:  Not on file    Relationship status: Not on file  . Intimate partner violence    Fear of current or ex partner: Not on file    Emotionally abused: Not on file    Physically abused: Not on file    Forced sexual activity: Not on file  Other Topics Concern  . Not on file  Social History Narrative   ** Merged History Encounter **        ROS Review of Systems  Constitutional: Negative for appetite change, diaphoresis, fatigue and unexpected weight change.  Eyes: Negative for pain, redness and visual disturbance.  Respiratory: Negative for cough, chest tightness, shortness of breath and wheezing.   Cardiovascular: Negative for chest pain, palpitations and leg swelling.  Endocrine: Negative for cold intolerance, heat intolerance, polydipsia, polyphagia and polyuria.  Genitourinary: Negative for difficulty urinating, dysuria and frequency.  Musculoskeletal: Positive for arthralgias and back pain.  Neurological: Negative for dizziness, light-headedness, numbness and headaches.    Objective:   Today's Vitals: BP (!) 142/73 (BP Location: Left Arm, Cuff Size: Normal)   Pulse 72   Temp (!) 96 F (35.6 C) (Temporal)   Resp 12   SpO2 98%   Physical Exam Vitals signs and nursing note reviewed.  Constitutional:      Appearance: She is well-developed.  HENT:     Head: Normocephalic and atraumatic.  Eyes:     Conjunctiva/sclera: Conjunctivae normal.  Neck:     Musculoskeletal: Normal range of motion and neck supple.     Thyroid: No thyromegaly.     Vascular: No carotid bruit or JVD.  Cardiovascular:     Rate and Rhythm: Normal rate and regular rhythm.     Heart sounds: Normal heart sounds. No murmur.  Pulmonary:     Effort: Pulmonary effort is normal. No respiratory distress.     Breath sounds: Normal breath sounds. No wheezing or rales.  Chest:     Chest wall: Tenderness present.    Musculoskeletal:        General: Swelling present.     Left ankle: Tenderness.       Feet:   Neurological:     Mental Status: She is alert and oriented to person, place, and time.     Assessment & Plan:   Problem List Items Addressed This Visit  Unprioritized   Ankle fracture    Healing well F/u with ortho      Hyperlipidemia    Tolerating statin, encouraged heart healthy diet, avoid trans fats, minimize simple carbs and saturated fats. Increase exercise as tolerated      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[place])    Per oncology      Relevant Medications   traMADol (ULTRAM) 50 MG tablet   Sternal fracture - Primary   Relevant Medications   traMADol (ULTRAM) 50 MG tablet      Outpatient Encounter Medications as of 01/18/2019  Medication Sig  . acetaminophen (TYLENOL) 325 MG tablet Take 2 tablets (650 mg total) by mouth every 6 (six) hours as needed (for mild - moderate).  [name] amLODipine (NORVASC) 5 MG tablet Take 1 tablet (5 mg total) by mouth daily with breakfast.  . aspirin 81 MG chewable tablet Chew 1 tablet (81 mg total) by mouth daily.  [name] buPROPion (WELLBUTRIN XL) 150 MG 24 hr tablet Take 1 tablet (150 mg total) by mouth daily.  . famotidine (PEPCID) 20 MG tablet One at bedtime  . levothyroxine (SYNTHROID) 125 MCG tablet Take 125 mcg by mouth daily before breakfast.  . magic mouthwash w/lidocaine SOLN Take 5 mLs by mouth 4 (four) times daily as needed for mouth pain. Swish and Spit  . omeprazole (PRILOSEC) 20 MG capsule Take 20 mg by mouth daily.  . ondansetron (ZOFRAN) 4 MG tablet Take 1 tablet (4 mg total) by mouth every 8 (eight) hours as needed for nausea or vomiting.  [name] oxyCODONE (OXY IR/ROXICODONE) 5 MG immediate release tablet Take 1-2 tablets (5-10 mg total) by mouth every 4 (four) hours as needed for moderate pain (pain score 4-6).  [name] traMADol (ULTRAM) 50 MG tablet Take 1 tablet (50 mg total) by mouth every 6 (six) hours.  . traZODone (DESYREL) 50 MG tablet Take 1 tablet (50 mg total) by mouth at bedtime as needed for sleep.  . [DISCONTINUED] traMADol (ULTRAM)  50 MG tablet Take 1 tablet (50 mg total) by mouth every 6 (six) hours.  . [DISCONTINUED] lidocaine (LIDODERM) 5 % Place 2 patches onto the skin daily. Remove & Discard patch within 12 hours or as directed by MD  . [DISCONTINUED] traZODone (DESYREL) 50 MG tablet Take 1 tablet (50 mg total) by mouth at bedtime as needed for sleep.   No facility-administered encounter medications on file as of 01/18/2019.     Follow-up: Return in about 6 months (around 07/18/2019), or if symptoms worsen or fail to improve, for hypertension   , thyroid.   [name], DO

## 2019-01-19 ENCOUNTER — Inpatient Hospital Stay (HOSPITAL_BASED_OUTPATIENT_CLINIC_OR_DEPARTMENT_OTHER): Payer: Medicare Other | Admitting: Hematology

## 2019-01-19 ENCOUNTER — Inpatient Hospital Stay: Payer: Medicare Other

## 2019-01-19 ENCOUNTER — Encounter: Payer: Self-pay | Admitting: *Deleted

## 2019-01-19 ENCOUNTER — Other Ambulatory Visit: Payer: Self-pay

## 2019-01-19 ENCOUNTER — Inpatient Hospital Stay: Payer: Medicare Other | Attending: Hematology

## 2019-01-19 ENCOUNTER — Telehealth: Payer: Self-pay | Admitting: Hematology

## 2019-01-19 ENCOUNTER — Encounter: Payer: Self-pay | Admitting: Hematology

## 2019-01-19 VITALS — BP 147/74 | HR 75 | Temp 97.1°F | Resp 20

## 2019-01-19 VITALS — BP 139/75 | HR 74 | Temp 97.1°F | Resp 18

## 2019-01-19 DIAGNOSIS — C8580 Other specified types of non-Hodgkin lymphoma, unspecified site: Secondary | ICD-10-CM

## 2019-01-19 DIAGNOSIS — C8304 Small cell B-cell lymphoma, lymph nodes of axilla and upper limb: Secondary | ICD-10-CM | POA: Insufficient documentation

## 2019-01-19 DIAGNOSIS — I700 Atherosclerosis of aorta: Secondary | ICD-10-CM | POA: Diagnosis not present

## 2019-01-19 DIAGNOSIS — Z888 Allergy status to other drugs, medicaments and biological substances status: Secondary | ICD-10-CM | POA: Diagnosis not present

## 2019-01-19 DIAGNOSIS — T451X5A Adverse effect of antineoplastic and immunosuppressive drugs, initial encounter: Secondary | ICD-10-CM | POA: Diagnosis not present

## 2019-01-19 DIAGNOSIS — Z881 Allergy status to other antibiotic agents status: Secondary | ICD-10-CM | POA: Insufficient documentation

## 2019-01-19 DIAGNOSIS — R748 Abnormal levels of other serum enzymes: Secondary | ICD-10-CM | POA: Insufficient documentation

## 2019-01-19 DIAGNOSIS — D649 Anemia, unspecified: Secondary | ICD-10-CM

## 2019-01-19 DIAGNOSIS — Z5112 Encounter for antineoplastic immunotherapy: Secondary | ICD-10-CM | POA: Insufficient documentation

## 2019-01-19 DIAGNOSIS — Z79899 Other long term (current) drug therapy: Secondary | ICD-10-CM | POA: Insufficient documentation

## 2019-01-19 DIAGNOSIS — D6481 Anemia due to antineoplastic chemotherapy: Secondary | ICD-10-CM | POA: Diagnosis not present

## 2019-01-19 DIAGNOSIS — S82892D Other fracture of left lower leg, subsequent encounter for closed fracture with routine healing: Secondary | ICD-10-CM | POA: Diagnosis not present

## 2019-01-19 DIAGNOSIS — S82891D Other fracture of right lower leg, subsequent encounter for closed fracture with routine healing: Secondary | ICD-10-CM | POA: Insufficient documentation

## 2019-01-19 LAB — CMP (CANCER CENTER ONLY)
ALT: 9 U/L (ref 0–44)
AST: 12 U/L — ABNORMAL LOW (ref 15–41)
Albumin: 4.3 g/dL (ref 3.5–5.0)
Alkaline Phosphatase: 140 U/L — ABNORMAL HIGH (ref 38–126)
Anion gap: 7 (ref 5–15)
BUN: 24 mg/dL — ABNORMAL HIGH (ref 8–23)
CO2: 29 mmol/L (ref 22–32)
Calcium: 9.2 mg/dL (ref 8.9–10.3)
Chloride: 103 mmol/L (ref 98–111)
Creatinine: 0.83 mg/dL (ref 0.44–1.00)
GFR, Est AFR Am: >60 mL/min (ref >60)
GFR, Estimated: >60 mL/min (ref >60)
Glucose, Bld: 96 mg/dL (ref 70–99)
Potassium: 4.3 mmol/L (ref 3.5–5.1)
Sodium: 139 mmol/L (ref 135–145)
Total Bilirubin: 0.3 mg/dL (ref 0.3–1.2)
Total Protein: 5.7 g/dL — ABNORMAL LOW (ref 6.5–8.1)

## 2019-01-19 LAB — MAGNESIUM: Magnesium: 1.9 mg/dL (ref 1.7–2.4)

## 2019-01-19 LAB — CBC WITH DIFFERENTIAL (CANCER CENTER ONLY)
Abs Immature Granulocytes: 0.04 10*3/uL (ref 0.00–0.07)
Basophils Absolute: 0.1 10*3/uL (ref 0.0–0.1)
Basophils Relative: 1 %
Eosinophils Absolute: 0.3 10*3/uL (ref 0.0–0.5)
Eosinophils Relative: 6 %
HCT: 35.4 % — ABNORMAL LOW (ref 36.0–46.0)
Hemoglobin: 11.4 g/dL — ABNORMAL LOW (ref 12.0–15.0)
Immature Granulocytes: 1 %
Lymphocytes Relative: 19 %
Lymphs Abs: 1 10*3/uL (ref 0.7–4.0)
MCH: 28 pg (ref 26.0–34.0)
MCHC: 32.2 g/dL (ref 30.0–36.0)
MCV: 87 fL (ref 80.0–100.0)
Monocytes Absolute: 0.8 10*3/uL (ref 0.1–1.0)
Monocytes Relative: 15 %
Neutro Abs: 3.2 10*3/uL (ref 1.7–7.7)
Neutrophils Relative %: 58 %
Platelet Count: 262 10*3/uL (ref 150–400)
RBC: 4.07 MIL/uL (ref 3.87–5.11)
RDW: 14.5 % (ref 11.5–15.5)
WBC Count: 5.5 10*3/uL (ref 4.0–10.5)
nRBC: 0 % (ref 0.0–0.2)

## 2019-01-19 MED ORDER — ACETAMINOPHEN 325 MG PO TABS
ORAL_TABLET | ORAL | Status: AC
  Filled 2019-01-19: qty 2

## 2019-01-19 MED ORDER — DIPHENHYDRAMINE HCL 25 MG PO CAPS
ORAL_CAPSULE | ORAL | Status: AC
  Filled 2019-01-19: qty 2

## 2019-01-19 MED ORDER — DIPHENHYDRAMINE HCL 25 MG PO CAPS
50.0000 mg | ORAL_CAPSULE | Freq: Once | ORAL | Status: AC
  Administered 2019-01-19: 11:00:00 50 mg via ORAL

## 2019-01-19 MED ORDER — SODIUM CHLORIDE 0.9 % IV SOLN
Freq: Once | INTRAVENOUS | Status: AC
  Administered 2019-01-19: 12:00:00 via INTRAVENOUS
  Filled 2019-01-19: qty 250

## 2019-01-19 MED ORDER — SODIUM CHLORIDE 0.9% FLUSH
10.0000 mL | INTRAVENOUS | Status: DC | PRN
  Administered 2019-01-19: 10 mL
  Filled 2019-01-19: qty 10

## 2019-01-19 MED ORDER — HEPARIN SOD (PORK) LOCK FLUSH 100 UNIT/ML IV SOLN
500.0000 [IU] | Freq: Once | INTRAVENOUS | Status: AC | PRN
  Administered 2019-01-19: 15:00:00 500 [IU]
  Filled 2019-01-19: qty 5

## 2019-01-19 MED ORDER — ACETAMINOPHEN 325 MG PO TABS
650.0000 mg | ORAL_TABLET | Freq: Once | ORAL | Status: AC
  Administered 2019-01-19: 11:00:00 650 mg via ORAL

## 2019-01-19 MED ORDER — SODIUM CHLORIDE 0.9 % IV SOLN
375.0000 mg/m2 | Freq: Once | INTRAVENOUS | Status: AC
  Administered 2019-01-19: 12:00:00 600 mg via INTRAVENOUS
  Filled 2019-01-19: qty 50

## 2019-01-19 NOTE — Patient Instructions (Signed)
Implanted Port Insertion, Care After This sheet gives you information about how to care for yourself after your procedure. Your health care provider may also give you more specific instructions. If you have problems or questions, contact your health care provider. What can I expect after the procedure? After the procedure, it is common to have:  Discomfort at the port insertion site.  Bruising on the skin over the port. This should improve over 3-4 days. Follow these instructions at home: Port care  After your port is placed, you will get a manufacturer's information card. The card has information about your port. Keep this card with you at all times.  Take care of the port as told by your health care provider. Ask your health care provider if you or a family member can get training for taking care of the port at home. A home health care nurse may also take care of the port.  Make sure to remember what type of port you have. Incision care      Follow instructions from your health care provider about how to take care of your port insertion site. Make sure you: ? Wash your hands with soap and water before and after you change your bandage (dressing). If soap and water are not available, use hand sanitizer. ? Change your dressing as told by your health care provider. ? Leave stitches (sutures), skin glue, or adhesive strips in place. These skin closures may need to stay in place for 2 weeks or longer. If adhesive strip edges start to loosen and curl up, you may trim the loose edges. Do not remove adhesive strips completely unless your health care provider tells you to do that.  Check your port insertion site every day for signs of infection. Check for: ? Redness, swelling, or pain. ? Fluid or blood. ? Warmth. ? Pus or a bad smell. Activity  Return to your normal activities as told by your health care provider. Ask your health care provider what activities are safe for you.  Do not  lift anything that is heavier than 10 lb (4.5 kg), or the limit that you are told, until your health care provider says that it is safe. General instructions  Take over-the-counter and prescription medicines only as told by your health care provider.  Do not take baths, swim, or use a hot tub until your health care provider approves. Ask your health care provider if you may take showers. You may only be allowed to take sponge baths.  Do not drive for 24 hours if you were given a sedative during your procedure.  Wear a medical alert bracelet in case of an emergency. This will tell any health care providers that you have a port.  Keep all follow-up visits as told by your health care provider. This is important. Contact a health care provider if:  You cannot flush your port with saline as directed, or you cannot draw blood from the port.  You have a fever or chills.  You have redness, swelling, or pain around your port insertion site.  You have fluid or blood coming from your port insertion site.  Your port insertion site feels warm to the touch.  You have pus or a bad smell coming from the port insertion site. Get help right away if:  You have chest pain or shortness of breath.  You have bleeding from your port that you cannot control. Summary  Take care of the port as told by your health   care provider. Keep the manufacturer's information card with you at all times.  Change your dressing as told by your health care provider.  Contact a health care provider if you have a fever or chills or if you have redness, swelling, or pain around your port insertion site.  Keep all follow-up visits as told by your health care provider. This information is not intended to replace advice given to you by your health care provider. Make sure you discuss any questions you have with your health care provider. Document Released: 12/09/2012 Document Revised: 09/16/2017 Document Reviewed: 09/16/2017  [employer]vier Patient Education  2020 [employer]  
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
## 2019-01-19 NOTE — Telephone Encounter (Signed)
Per 11/17 los No change in appts

## 2019-01-19 NOTE — Patient Instructions (Signed)
[facility] Discharge Instructions for Patients Receiving Chemotherapy  Today you received the following chemotherapy agents: Rituxan   To help prevent nausea and vomiting after your treatment, we encourage you to take your nausea medication as directed.    If you develop nausea and vomiting that is not controlled by your nausea medication, call the clinic.   BELOW ARE SYMPTOMS THAT SHOULD BE REPORTED IMMEDIATELY:  *FEVER GREATER THAN 100.5 F  *CHILLS WITH OR WITHOUT FEVER  NAUSEA AND VOMITING THAT IS NOT CONTROLLED WITH YOUR NAUSEA MEDICATION  *UNUSUAL SHORTNESS OF BREATH  *UNUSUAL BRUISING OR BLEEDING  TENDERNESS IN MOUTH AND THROAT WITH OR WITHOUT PRESENCE OF ULCERS  *URINARY PROBLEMS  *BOWEL PROBLEMS  UNUSUAL RASH Items with * indicate a potential emergency and should be followed up as soon as possible.  Feel free to call the clinic you have any questions or concerns. The clinic phone number is [phone].  Please show the CHEMO ALERT CARD at check-in to the Emergency Department and triage nurse.   

## 2019-01-20 DIAGNOSIS — S2241XD Multiple fractures of ribs, right side, subsequent encounter for fracture with routine healing: Secondary | ICD-10-CM | POA: Diagnosis not present

## 2019-01-20 DIAGNOSIS — S82852D Displaced trimalleolar fracture of left lower leg, subsequent encounter for closed fracture with routine healing: Secondary | ICD-10-CM | POA: Diagnosis not present

## 2019-01-20 DIAGNOSIS — S32018D Other fracture of first lumbar vertebra, subsequent encounter for fracture with routine healing: Secondary | ICD-10-CM | POA: Diagnosis not present

## 2019-01-20 DIAGNOSIS — E039 Hypothyroidism, unspecified: Secondary | ICD-10-CM | POA: Diagnosis not present

## 2019-01-20 DIAGNOSIS — S81012D Laceration without foreign body, left knee, subsequent encounter: Secondary | ICD-10-CM | POA: Diagnosis not present

## 2019-01-20 DIAGNOSIS — S32048D Other fracture of fourth lumbar vertebra, subsequent encounter for fracture with routine healing: Secondary | ICD-10-CM | POA: Diagnosis not present

## 2019-01-20 DIAGNOSIS — S2220XD Unspecified fracture of sternum, subsequent encounter for fracture with routine healing: Secondary | ICD-10-CM | POA: Diagnosis not present

## 2019-01-20 DIAGNOSIS — S32028D Other fracture of second lumbar vertebra, subsequent encounter for fracture with routine healing: Secondary | ICD-10-CM | POA: Diagnosis not present

## 2019-01-20 DIAGNOSIS — Z8572 Personal history of non-Hodgkin lymphomas: Secondary | ICD-10-CM | POA: Diagnosis not present

## 2019-01-20 DIAGNOSIS — I10 Essential (primary) hypertension: Secondary | ICD-10-CM | POA: Diagnosis not present

## 2019-01-20 DIAGNOSIS — E0590 Thyrotoxicosis, unspecified without thyrotoxic crisis or storm: Secondary | ICD-10-CM | POA: Diagnosis not present

## 2019-01-21 DIAGNOSIS — S32049D Unspecified fracture of fourth lumbar vertebra, subsequent encounter for fracture with routine healing: Secondary | ICD-10-CM | POA: Diagnosis not present

## 2019-01-21 DIAGNOSIS — S32029D Unspecified fracture of second lumbar vertebra, subsequent encounter for fracture with routine healing: Secondary | ICD-10-CM | POA: Diagnosis not present

## 2019-01-21 DIAGNOSIS — S2241XD Multiple fractures of ribs, right side, subsequent encounter for fracture with routine healing: Secondary | ICD-10-CM | POA: Diagnosis not present

## 2019-01-21 DIAGNOSIS — S32011A Stable burst fracture of first lumbar vertebra, initial encounter for closed fracture: Secondary | ICD-10-CM | POA: Diagnosis not present

## 2019-01-21 DIAGNOSIS — S32018D Other fracture of first lumbar vertebra, subsequent encounter for fracture with routine healing: Secondary | ICD-10-CM | POA: Diagnosis not present

## 2019-01-21 DIAGNOSIS — S2220XD Unspecified fracture of sternum, subsequent encounter for fracture with routine healing: Secondary | ICD-10-CM | POA: Diagnosis not present

## 2019-01-21 DIAGNOSIS — S82852D Displaced trimalleolar fracture of left lower leg, subsequent encounter for closed fracture with routine healing: Secondary | ICD-10-CM | POA: Diagnosis not present

## 2019-01-21 DIAGNOSIS — I10 Essential (primary) hypertension: Secondary | ICD-10-CM | POA: Diagnosis not present

## 2019-01-22 DIAGNOSIS — S2220XD Unspecified fracture of sternum, subsequent encounter for fracture with routine healing: Secondary | ICD-10-CM | POA: Diagnosis not present

## 2019-01-22 DIAGNOSIS — S32049D Unspecified fracture of fourth lumbar vertebra, subsequent encounter for fracture with routine healing: Secondary | ICD-10-CM | POA: Diagnosis not present

## 2019-01-22 DIAGNOSIS — S32029D Unspecified fracture of second lumbar vertebra, subsequent encounter for fracture with routine healing: Secondary | ICD-10-CM | POA: Diagnosis not present

## 2019-01-22 DIAGNOSIS — S2241XD Multiple fractures of ribs, right side, subsequent encounter for fracture with routine healing: Secondary | ICD-10-CM | POA: Diagnosis not present

## 2019-01-22 DIAGNOSIS — S82852D Displaced trimalleolar fracture of left lower leg, subsequent encounter for closed fracture with routine healing: Secondary | ICD-10-CM | POA: Diagnosis not present

## 2019-01-22 DIAGNOSIS — S32018D Other fracture of first lumbar vertebra, subsequent encounter for fracture with routine healing: Secondary | ICD-10-CM | POA: Diagnosis not present

## 2019-01-25 DIAGNOSIS — S32029D Unspecified fracture of second lumbar vertebra, subsequent encounter for fracture with routine healing: Secondary | ICD-10-CM | POA: Diagnosis not present

## 2019-01-25 DIAGNOSIS — S32049D Unspecified fracture of fourth lumbar vertebra, subsequent encounter for fracture with routine healing: Secondary | ICD-10-CM | POA: Diagnosis not present

## 2019-01-25 DIAGNOSIS — S2241XD Multiple fractures of ribs, right side, subsequent encounter for fracture with routine healing: Secondary | ICD-10-CM | POA: Diagnosis not present

## 2019-01-25 DIAGNOSIS — S32018D Other fracture of first lumbar vertebra, subsequent encounter for fracture with routine healing: Secondary | ICD-10-CM | POA: Diagnosis not present

## 2019-01-25 DIAGNOSIS — S82852D Displaced trimalleolar fracture of left lower leg, subsequent encounter for closed fracture with routine healing: Secondary | ICD-10-CM | POA: Diagnosis not present

## 2019-01-25 DIAGNOSIS — S2220XD Unspecified fracture of sternum, subsequent encounter for fracture with routine healing: Secondary | ICD-10-CM | POA: Diagnosis not present

## 2019-01-27 DIAGNOSIS — S32018D Other fracture of first lumbar vertebra, subsequent encounter for fracture with routine healing: Secondary | ICD-10-CM | POA: Diagnosis not present

## 2019-01-27 DIAGNOSIS — S82852D Displaced trimalleolar fracture of left lower leg, subsequent encounter for closed fracture with routine healing: Secondary | ICD-10-CM | POA: Diagnosis not present

## 2019-01-27 DIAGNOSIS — S2220XD Unspecified fracture of sternum, subsequent encounter for fracture with routine healing: Secondary | ICD-10-CM | POA: Diagnosis not present

## 2019-01-27 DIAGNOSIS — S32029D Unspecified fracture of second lumbar vertebra, subsequent encounter for fracture with routine healing: Secondary | ICD-10-CM | POA: Diagnosis not present

## 2019-01-27 DIAGNOSIS — S2241XD Multiple fractures of ribs, right side, subsequent encounter for fracture with routine healing: Secondary | ICD-10-CM | POA: Diagnosis not present

## 2019-01-27 DIAGNOSIS — S32049D Unspecified fracture of fourth lumbar vertebra, subsequent encounter for fracture with routine healing: Secondary | ICD-10-CM | POA: Diagnosis not present

## 2019-01-29 ENCOUNTER — Encounter: Payer: Self-pay | Admitting: Family Medicine

## 2019-02-01 DIAGNOSIS — S2241XD Multiple fractures of ribs, right side, subsequent encounter for fracture with routine healing: Secondary | ICD-10-CM | POA: Diagnosis not present

## 2019-02-01 DIAGNOSIS — S32018D Other fracture of first lumbar vertebra, subsequent encounter for fracture with routine healing: Secondary | ICD-10-CM | POA: Diagnosis not present

## 2019-02-01 DIAGNOSIS — S32049D Unspecified fracture of fourth lumbar vertebra, subsequent encounter for fracture with routine healing: Secondary | ICD-10-CM | POA: Diagnosis not present

## 2019-02-01 DIAGNOSIS — S2220XD Unspecified fracture of sternum, subsequent encounter for fracture with routine healing: Secondary | ICD-10-CM | POA: Diagnosis not present

## 2019-02-01 DIAGNOSIS — S82852D Displaced trimalleolar fracture of left lower leg, subsequent encounter for closed fracture with routine healing: Secondary | ICD-10-CM | POA: Diagnosis not present

## 2019-02-01 DIAGNOSIS — S32029D Unspecified fracture of second lumbar vertebra, subsequent encounter for fracture with routine healing: Secondary | ICD-10-CM | POA: Diagnosis not present

## 2019-02-02 MED ORDER — LEVOTHYROXINE SODIUM 125 MCG PO TABS: 125.0000 ug | ORAL_TABLET | Freq: Every day | ORAL | 2 refills | Status: DC

## 2019-02-02 MED ORDER — BUPROPION HCL ER (XL) 150 MG PO TB24: 150.0000 mg | ORAL_TABLET | Freq: Every day | ORAL | 2 refills | Status: DC

## 2019-02-03 ENCOUNTER — Encounter: Payer: Self-pay | Admitting: Orthopedic Surgery

## 2019-02-03 ENCOUNTER — Encounter: Payer: Medicare Other | Attending: Physical Medicine & Rehabilitation | Admitting: Physical Medicine & Rehabilitation

## 2019-02-03 ENCOUNTER — Ambulatory Visit: Payer: Self-pay

## 2019-02-03 ENCOUNTER — Ambulatory Visit (INDEPENDENT_AMBULATORY_CARE_PROVIDER_SITE_OTHER): Payer: Medicare Other | Admitting: Orthopedic Surgery

## 2019-02-03 ENCOUNTER — Encounter: Payer: Self-pay | Admitting: Physical Medicine & Rehabilitation

## 2019-02-03 ENCOUNTER — Other Ambulatory Visit: Payer: Self-pay

## 2019-02-03 DIAGNOSIS — I10 Essential (primary) hypertension: Secondary | ICD-10-CM | POA: Insufficient documentation

## 2019-02-03 DIAGNOSIS — Z8249 Family history of ischemic heart disease and other diseases of the circulatory system: Secondary | ICD-10-CM | POA: Diagnosis not present

## 2019-02-03 DIAGNOSIS — S82892S Other fracture of left lower leg, sequela: Secondary | ICD-10-CM

## 2019-02-03 DIAGNOSIS — Z833 Family history of diabetes mellitus: Secondary | ICD-10-CM | POA: Diagnosis not present

## 2019-02-03 DIAGNOSIS — S2222XA Fracture of body of sternum, initial encounter for closed fracture: Secondary | ICD-10-CM | POA: Insufficient documentation

## 2019-02-03 DIAGNOSIS — R110 Nausea: Secondary | ICD-10-CM | POA: Diagnosis not present

## 2019-02-03 DIAGNOSIS — M47812 Spondylosis without myelopathy or radiculopathy, cervical region: Secondary | ICD-10-CM | POA: Insufficient documentation

## 2019-02-03 DIAGNOSIS — M810 Age-related osteoporosis without current pathological fracture: Secondary | ICD-10-CM | POA: Diagnosis not present

## 2019-02-03 DIAGNOSIS — Z823 Family history of stroke: Secondary | ICD-10-CM | POA: Insufficient documentation

## 2019-02-03 DIAGNOSIS — Z803 Family history of malignant neoplasm of breast: Secondary | ICD-10-CM | POA: Insufficient documentation

## 2019-02-03 DIAGNOSIS — T07XXXA Unspecified multiple injuries, initial encounter: Secondary | ICD-10-CM | POA: Diagnosis not present

## 2019-02-03 DIAGNOSIS — Z8041 Family history of malignant neoplasm of ovary: Secondary | ICD-10-CM | POA: Insufficient documentation

## 2019-02-03 DIAGNOSIS — Z8042 Family history of malignant neoplasm of prostate: Secondary | ICD-10-CM | POA: Insufficient documentation

## 2019-02-03 MED ORDER — TRAMADOL HCL 50 MG PO TABS: 50.0000 mg | ORAL_TABLET | Freq: Four times a day (QID) | ORAL | 0 refills | Status: DC
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
## 2019-02-03 NOTE — Patient Instructions (Signed)
STOP OXYCODONE  YOU MAY USE TRAMADOL FOR MORE SEVERE PAIN  YOU MAY USE UP TO 2500MG  TYLENOL DAILY    BOWELS SENOKOT-S -----TAKE 2 AT BEDTIME MAKE YOU'RE DRINKING PLENTY OF FLUIDS, EATING FRUITS AND VEGGIES  EVERY 2 DAYS SHOULD BE MAX TIME BETWEEN BM'S

## 2019-02-05 ENCOUNTER — Encounter: Payer: Self-pay | Admitting: Orthopedic Surgery

## 2019-02-05 DIAGNOSIS — S32029D Unspecified fracture of second lumbar vertebra, subsequent encounter for fracture with routine healing: Secondary | ICD-10-CM | POA: Diagnosis not present

## 2019-02-05 DIAGNOSIS — S82852D Displaced trimalleolar fracture of left lower leg, subsequent encounter for closed fracture with routine healing: Secondary | ICD-10-CM | POA: Diagnosis not present

## 2019-02-05 DIAGNOSIS — S2220XD Unspecified fracture of sternum, subsequent encounter for fracture with routine healing: Secondary | ICD-10-CM | POA: Diagnosis not present

## 2019-02-05 DIAGNOSIS — S2241XD Multiple fractures of ribs, right side, subsequent encounter for fracture with routine healing: Secondary | ICD-10-CM | POA: Diagnosis not present

## 2019-02-05 DIAGNOSIS — S32018D Other fracture of first lumbar vertebra, subsequent encounter for fracture with routine healing: Secondary | ICD-10-CM | POA: Diagnosis not present

## 2019-02-05 DIAGNOSIS — S32049D Unspecified fracture of fourth lumbar vertebra, subsequent encounter for fracture with routine healing: Secondary | ICD-10-CM | POA: Diagnosis not present
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
## 2019-02-08 DIAGNOSIS — S82852D Displaced trimalleolar fracture of left lower leg, subsequent encounter for closed fracture with routine healing: Secondary | ICD-10-CM | POA: Diagnosis not present

## 2019-02-08 DIAGNOSIS — S32049D Unspecified fracture of fourth lumbar vertebra, subsequent encounter for fracture with routine healing: Secondary | ICD-10-CM | POA: Diagnosis not present

## 2019-02-08 DIAGNOSIS — S2220XD Unspecified fracture of sternum, subsequent encounter for fracture with routine healing: Secondary | ICD-10-CM | POA: Diagnosis not present

## 2019-02-08 DIAGNOSIS — S32018D Other fracture of first lumbar vertebra, subsequent encounter for fracture with routine healing: Secondary | ICD-10-CM | POA: Diagnosis not present

## 2019-02-08 DIAGNOSIS — S2241XD Multiple fractures of ribs, right side, subsequent encounter for fracture with routine healing: Secondary | ICD-10-CM | POA: Diagnosis not present

## 2019-02-08 DIAGNOSIS — S32029D Unspecified fracture of second lumbar vertebra, subsequent encounter for fracture with routine healing: Secondary | ICD-10-CM | POA: Diagnosis not present

## 2019-02-11 ENCOUNTER — Telehealth: Payer: Self-pay | Admitting: Orthopedic Surgery

## 2019-02-11 DIAGNOSIS — S32049D Unspecified fracture of fourth lumbar vertebra, subsequent encounter for fracture with routine healing: Secondary | ICD-10-CM | POA: Diagnosis not present

## 2019-02-11 DIAGNOSIS — S2241XD Multiple fractures of ribs, right side, subsequent encounter for fracture with routine healing: Secondary | ICD-10-CM | POA: Diagnosis not present

## 2019-02-11 DIAGNOSIS — S82852D Displaced trimalleolar fracture of left lower leg, subsequent encounter for closed fracture with routine healing: Secondary | ICD-10-CM | POA: Diagnosis not present

## 2019-02-11 DIAGNOSIS — S2220XD Unspecified fracture of sternum, subsequent encounter for fracture with routine healing: Secondary | ICD-10-CM | POA: Diagnosis not present

## 2019-02-11 DIAGNOSIS — S32029D Unspecified fracture of second lumbar vertebra, subsequent encounter for fracture with routine healing: Secondary | ICD-10-CM | POA: Diagnosis not present

## 2019-02-11 DIAGNOSIS — S32018D Other fracture of first lumbar vertebra, subsequent encounter for fracture with routine healing: Secondary | ICD-10-CM | POA: Diagnosis not present

## 2019-02-11 NOTE — Telephone Encounter (Signed)
Amanda [name] called on behalf of patient asking for advise on range in motion for ankles. Patient asking for call back.Patient phone number is  [phone]

## 2019-02-12 NOTE — Telephone Encounter (Signed)
Patient's ankle ROM was excellent at last OV and she should be able to discontinue ankle ROM therapy at this point.

## 2019-02-12 NOTE — Telephone Encounter (Signed)
Please advise. Thanks.  
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
## 2019-02-14 ENCOUNTER — Encounter: Payer: Self-pay | Admitting: Orthopedic Surgery

## 2019-02-15 DIAGNOSIS — K589 Irritable bowel syndrome without diarrhea: Secondary | ICD-10-CM | POA: Diagnosis not present

## 2019-02-15 DIAGNOSIS — K5909 Other constipation: Secondary | ICD-10-CM | POA: Diagnosis not present

## 2019-02-15 DIAGNOSIS — S2241XD Multiple fractures of ribs, right side, subsequent encounter for fracture with routine healing: Secondary | ICD-10-CM | POA: Diagnosis not present

## 2019-02-15 DIAGNOSIS — N393 Stress incontinence (female) (male): Secondary | ICD-10-CM | POA: Diagnosis not present

## 2019-02-15 DIAGNOSIS — S32049D Unspecified fracture of fourth lumbar vertebra, subsequent encounter for fracture with routine healing: Secondary | ICD-10-CM | POA: Diagnosis not present

## 2019-02-15 DIAGNOSIS — N3592 Unspecified urethral stricture, female: Secondary | ICD-10-CM | POA: Diagnosis not present

## 2019-02-15 DIAGNOSIS — I129 Hypertensive chronic kidney disease with stage 1 through stage 4 chronic kidney disease, or unspecified chronic kidney disease: Secondary | ICD-10-CM | POA: Diagnosis not present

## 2019-02-15 DIAGNOSIS — S82852D Displaced trimalleolar fracture of left lower leg, subsequent encounter for closed fracture with routine healing: Secondary | ICD-10-CM | POA: Diagnosis not present

## 2019-02-15 DIAGNOSIS — D630 Anemia in neoplastic disease: Secondary | ICD-10-CM | POA: Diagnosis not present

## 2019-02-15 DIAGNOSIS — S32018D Other fracture of first lumbar vertebra, subsequent encounter for fracture with routine healing: Secondary | ICD-10-CM | POA: Diagnosis not present

## 2019-02-15 DIAGNOSIS — E785 Hyperlipidemia, unspecified: Secondary | ICD-10-CM | POA: Diagnosis not present

## 2019-02-15 DIAGNOSIS — M8580 Other specified disorders of bone density and structure, unspecified site: Secondary | ICD-10-CM | POA: Diagnosis not present

## 2019-02-15 DIAGNOSIS — D631 Anemia in chronic kidney disease: Secondary | ICD-10-CM | POA: Diagnosis not present

## 2019-02-15 DIAGNOSIS — C8590 Non-Hodgkin lymphoma, unspecified, unspecified site: Secondary | ICD-10-CM | POA: Diagnosis not present

## 2019-02-15 DIAGNOSIS — J479 Bronchiectasis, uncomplicated: Secondary | ICD-10-CM | POA: Diagnosis not present

## 2019-02-15 DIAGNOSIS — R32 Unspecified urinary incontinence: Secondary | ICD-10-CM | POA: Diagnosis not present

## 2019-02-15 DIAGNOSIS — B029 Zoster without complications: Secondary | ICD-10-CM | POA: Diagnosis not present

## 2019-02-15 DIAGNOSIS — N183 Chronic kidney disease, stage 3 unspecified: Secondary | ICD-10-CM | POA: Diagnosis not present

## 2019-02-15 DIAGNOSIS — M47812 Spondylosis without myelopathy or radiculopathy, cervical region: Secondary | ICD-10-CM | POA: Diagnosis not present

## 2019-02-15 DIAGNOSIS — E0590 Thyrotoxicosis, unspecified without thyrotoxic crisis or storm: Secondary | ICD-10-CM | POA: Diagnosis not present

## 2019-02-15 DIAGNOSIS — S32029D Unspecified fracture of second lumbar vertebra, subsequent encounter for fracture with routine healing: Secondary | ICD-10-CM | POA: Diagnosis not present

## 2019-02-15 DIAGNOSIS — K219 Gastro-esophageal reflux disease without esophagitis: Secondary | ICD-10-CM | POA: Diagnosis not present

## 2019-02-15 DIAGNOSIS — S2220XD Unspecified fracture of sternum, subsequent encounter for fracture with routine healing: Secondary | ICD-10-CM | POA: Diagnosis not present

## 2019-02-15 DIAGNOSIS — E039 Hypothyroidism, unspecified: Secondary | ICD-10-CM | POA: Diagnosis not present

## 2019-02-15 DIAGNOSIS — M47816 Spondylosis without myelopathy or radiculopathy, lumbar region: Secondary | ICD-10-CM | POA: Diagnosis not present

## 2019-02-17 DIAGNOSIS — N393 Stress incontinence (female) (male): Secondary | ICD-10-CM

## 2019-02-17 DIAGNOSIS — E039 Hypothyroidism, unspecified: Secondary | ICD-10-CM | POA: Diagnosis not present

## 2019-02-17 DIAGNOSIS — E785 Hyperlipidemia, unspecified: Secondary | ICD-10-CM

## 2019-02-17 DIAGNOSIS — S32049D Unspecified fracture of fourth lumbar vertebra, subsequent encounter for fracture with routine healing: Secondary | ICD-10-CM | POA: Diagnosis not present

## 2019-02-17 DIAGNOSIS — B029 Zoster without complications: Secondary | ICD-10-CM

## 2019-02-17 DIAGNOSIS — D630 Anemia in neoplastic disease: Secondary | ICD-10-CM

## 2019-02-17 DIAGNOSIS — J479 Bronchiectasis, uncomplicated: Secondary | ICD-10-CM

## 2019-02-17 DIAGNOSIS — Z7982 Long term (current) use of aspirin: Secondary | ICD-10-CM

## 2019-02-17 DIAGNOSIS — S82852D Displaced trimalleolar fracture of left lower leg, subsequent encounter for closed fracture with routine healing: Secondary | ICD-10-CM | POA: Diagnosis not present

## 2019-02-17 DIAGNOSIS — N183 Chronic kidney disease, stage 3 unspecified: Secondary | ICD-10-CM | POA: Diagnosis not present

## 2019-02-17 DIAGNOSIS — C8590 Non-Hodgkin lymphoma, unspecified, unspecified site: Secondary | ICD-10-CM | POA: Diagnosis not present

## 2019-02-17 DIAGNOSIS — S2241XD Multiple fractures of ribs, right side, subsequent encounter for fracture with routine healing: Secondary | ICD-10-CM | POA: Diagnosis not present

## 2019-02-17 DIAGNOSIS — M8580 Other specified disorders of bone density and structure, unspecified site: Secondary | ICD-10-CM

## 2019-02-17 DIAGNOSIS — S2220XD Unspecified fracture of sternum, subsequent encounter for fracture with routine healing: Secondary | ICD-10-CM | POA: Diagnosis not present

## 2019-02-17 DIAGNOSIS — R32 Unspecified urinary incontinence: Secondary | ICD-10-CM

## 2019-02-17 DIAGNOSIS — K5909 Other constipation: Secondary | ICD-10-CM | POA: Diagnosis not present

## 2019-02-17 DIAGNOSIS — Z8701 Personal history of pneumonia (recurrent): Secondary | ICD-10-CM

## 2019-02-17 DIAGNOSIS — S32018D Other fracture of first lumbar vertebra, subsequent encounter for fracture with routine healing: Secondary | ICD-10-CM | POA: Diagnosis not present

## 2019-02-17 DIAGNOSIS — K589 Irritable bowel syndrome without diarrhea: Secondary | ICD-10-CM

## 2019-02-17 DIAGNOSIS — I700 Atherosclerosis of aorta: Secondary | ICD-10-CM

## 2019-02-17 DIAGNOSIS — N3592 Unspecified urethral stricture, female: Secondary | ICD-10-CM

## 2019-02-17 DIAGNOSIS — Z85828 Personal history of other malignant neoplasm of skin: Secondary | ICD-10-CM

## 2019-02-17 DIAGNOSIS — D631 Anemia in chronic kidney disease: Secondary | ICD-10-CM | POA: Diagnosis not present

## 2019-02-17 DIAGNOSIS — E0590 Thyrotoxicosis, unspecified without thyrotoxic crisis or storm: Secondary | ICD-10-CM

## 2019-02-17 DIAGNOSIS — K5903 Drug induced constipation: Secondary | ICD-10-CM

## 2019-02-17 DIAGNOSIS — I129 Hypertensive chronic kidney disease with stage 1 through stage 4 chronic kidney disease, or unspecified chronic kidney disease: Secondary | ICD-10-CM | POA: Diagnosis not present

## 2019-02-17 DIAGNOSIS — S32029D Unspecified fracture of second lumbar vertebra, subsequent encounter for fracture with routine healing: Secondary | ICD-10-CM | POA: Diagnosis not present

## 2019-02-17 DIAGNOSIS — M47812 Spondylosis without myelopathy or radiculopathy, cervical region: Secondary | ICD-10-CM

## 2019-02-17 DIAGNOSIS — M47816 Spondylosis without myelopathy or radiculopathy, lumbar region: Secondary | ICD-10-CM

## 2019-02-17 DIAGNOSIS — K219 Gastro-esophageal reflux disease without esophagitis: Secondary | ICD-10-CM

## 2019-02-18 DIAGNOSIS — S32011A Stable burst fracture of first lumbar vertebra, initial encounter for closed fracture: Secondary | ICD-10-CM | POA: Diagnosis not present

## 2019-02-18 DIAGNOSIS — M4722 Other spondylosis with radiculopathy, cervical region: Secondary | ICD-10-CM | POA: Diagnosis not present

## 2019-02-22 DIAGNOSIS — S32029D Unspecified fracture of second lumbar vertebra, subsequent encounter for fracture with routine healing: Secondary | ICD-10-CM | POA: Diagnosis not present

## 2019-02-22 DIAGNOSIS — S2220XD Unspecified fracture of sternum, subsequent encounter for fracture with routine healing: Secondary | ICD-10-CM | POA: Diagnosis not present

## 2019-02-22 DIAGNOSIS — S2241XD Multiple fractures of ribs, right side, subsequent encounter for fracture with routine healing: Secondary | ICD-10-CM | POA: Diagnosis not present

## 2019-02-22 DIAGNOSIS — S32049D Unspecified fracture of fourth lumbar vertebra, subsequent encounter for fracture with routine healing: Secondary | ICD-10-CM | POA: Diagnosis not present

## 2019-02-22 DIAGNOSIS — S82852D Displaced trimalleolar fracture of left lower leg, subsequent encounter for closed fracture with routine healing: Secondary | ICD-10-CM | POA: Diagnosis not present

## 2019-02-22 DIAGNOSIS — S32018D Other fracture of first lumbar vertebra, subsequent encounter for fracture with routine healing: Secondary | ICD-10-CM | POA: Diagnosis not present

## 2019-02-24 ENCOUNTER — Ambulatory Visit (INDEPENDENT_AMBULATORY_CARE_PROVIDER_SITE_OTHER): Payer: Medicare Other | Admitting: Orthopedic Surgery

## 2019-02-24 ENCOUNTER — Other Ambulatory Visit: Payer: Self-pay

## 2019-02-24 ENCOUNTER — Encounter: Payer: Self-pay | Admitting: Orthopedic Surgery

## 2019-02-24 ENCOUNTER — Ambulatory Visit: Payer: Self-pay

## 2019-02-24 DIAGNOSIS — S82892S Other fracture of left lower leg, sequela: Secondary | ICD-10-CM

## 2019-02-25 ENCOUNTER — Encounter: Payer: Self-pay | Admitting: Orthopedic Surgery
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
## 2019-03-01 DIAGNOSIS — S32029D Unspecified fracture of second lumbar vertebra, subsequent encounter for fracture with routine healing: Secondary | ICD-10-CM | POA: Diagnosis not present

## 2019-03-01 DIAGNOSIS — S2220XD Unspecified fracture of sternum, subsequent encounter for fracture with routine healing: Secondary | ICD-10-CM | POA: Diagnosis not present

## 2019-03-01 DIAGNOSIS — S82852D Displaced trimalleolar fracture of left lower leg, subsequent encounter for closed fracture with routine healing: Secondary | ICD-10-CM | POA: Diagnosis not present

## 2019-03-01 DIAGNOSIS — S32049D Unspecified fracture of fourth lumbar vertebra, subsequent encounter for fracture with routine healing: Secondary | ICD-10-CM | POA: Diagnosis not present

## 2019-03-01 DIAGNOSIS — S2241XD Multiple fractures of ribs, right side, subsequent encounter for fracture with routine healing: Secondary | ICD-10-CM | POA: Diagnosis not present

## 2019-03-01 DIAGNOSIS — S32018D Other fracture of first lumbar vertebra, subsequent encounter for fracture with routine healing: Secondary | ICD-10-CM | POA: Diagnosis not present

## 2019-03-02 DIAGNOSIS — S2241XD Multiple fractures of ribs, right side, subsequent encounter for fracture with routine healing: Secondary | ICD-10-CM | POA: Diagnosis not present

## 2019-03-02 DIAGNOSIS — S2220XD Unspecified fracture of sternum, subsequent encounter for fracture with routine healing: Secondary | ICD-10-CM | POA: Diagnosis not present

## 2019-03-02 DIAGNOSIS — S32029D Unspecified fracture of second lumbar vertebra, subsequent encounter for fracture with routine healing: Secondary | ICD-10-CM | POA: Diagnosis not present

## 2019-03-02 DIAGNOSIS — S82852D Displaced trimalleolar fracture of left lower leg, subsequent encounter for closed fracture with routine healing: Secondary | ICD-10-CM | POA: Diagnosis not present

## 2019-03-02 DIAGNOSIS — S32049D Unspecified fracture of fourth lumbar vertebra, subsequent encounter for fracture with routine healing: Secondary | ICD-10-CM | POA: Diagnosis not present

## 2019-03-02 DIAGNOSIS — S32018D Other fracture of first lumbar vertebra, subsequent encounter for fracture with routine healing: Secondary | ICD-10-CM | POA: Diagnosis not present

## 2019-03-04 ENCOUNTER — Other Ambulatory Visit: Payer: Self-pay

## 2019-03-05 DIAGNOSIS — J1282 Pneumonia due to coronavirus disease 2019: Secondary | ICD-10-CM

## 2019-03-05 DIAGNOSIS — U071 COVID-19: Secondary | ICD-10-CM

## 2019-03-05 HISTORY — DX: COVID-19: U07.1

## 2019-03-05 HISTORY — DX: Pneumonia due to coronavirus disease 2019: J12.82

## 2019-03-08 ENCOUNTER — Encounter: Payer: Self-pay | Admitting: Obstetrics & Gynecology

## 2019-03-08 ENCOUNTER — Encounter: Payer: Self-pay | Admitting: *Deleted

## 2019-03-08 ENCOUNTER — Other Ambulatory Visit: Payer: Self-pay

## 2019-03-08 ENCOUNTER — Ambulatory Visit (INDEPENDENT_AMBULATORY_CARE_PROVIDER_SITE_OTHER): Payer: Medicare Other | Admitting: Obstetrics & Gynecology

## 2019-03-08 VITALS — BP 124/80

## 2019-03-08 DIAGNOSIS — N6310 Unspecified lump in the right breast, unspecified quadrant: Secondary | ICD-10-CM | POA: Diagnosis not present

## 2019-03-08 DIAGNOSIS — C8580 Other specified types of non-Hodgkin lymphoma, unspecified site: Secondary | ICD-10-CM | POA: Diagnosis not present

## 2019-03-08 DIAGNOSIS — N644 Mastodynia: Secondary | ICD-10-CM

## 2019-03-08 DIAGNOSIS — N6320 Unspecified lump in the left breast, unspecified quadrant: Secondary | ICD-10-CM
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
## 2019-03-09 ENCOUNTER — Encounter: Payer: Self-pay | Admitting: Pharmacist
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
## 2019-03-10 ENCOUNTER — Encounter: Payer: Self-pay | Admitting: Pharmacist

## 2019-03-10 NOTE — Progress Notes (Unsigned)
Rapid Infusion Rituximab Pharmacist Evaluation  Patients may be eligible for Rapid Infusion Rituximab (RIR) if they have no significant cardiac disease, no risk for Tumor Lysis Syndrome (TLS), received rituximab within the last 6 months, and tolerated those infusions per standard protocol without grade 3-4 infusion reactions. A pharmacist has verified the patient tolerated rituximab infusions per the [facility] standard infusion protocol without grade 3-4 infusion reactions. The treatment plan will be updated to reflect RIR if the patient qualifies per the checklist below.   Amanda [name] is a 79 y.o. female being treated with rituximab for NHL. This patient may be considered for RIR.    Age > 24 years old {YES/NO:21197}  Stable renal, hepatic, and hematologic function Yes   Recent Pertinent Lab Values  Lab Results  Component Value Date   CREATININE 0.83 01/19/2019   BILITOT 0.3 01/19/2019   Lab Results  Component Value Date   WBC 5.5 01/19/2019   LYMPHSABS 1.0 01/19/2019   PLT 262 01/19/2019     No   Previous rituximab infusion within 6 months {YES/NO:21197}  Physician approval of RIR {YES/NO:21197}  Treatment Plan updated orders to reflect RIR {YES/NO:21197}    Amanda [name], [name] 03/10/19 3:12 PM

## 2019-03-11 ENCOUNTER — Telehealth: Payer: Self-pay | Admitting: *Deleted

## 2019-03-11 DIAGNOSIS — N6320 Unspecified lump in the left breast, unspecified quadrant: Secondary | ICD-10-CM

## 2019-03-11 DIAGNOSIS — N6310 Unspecified lump in the right breast, unspecified quadrant: Secondary | ICD-10-CM

## 2019-03-11 NOTE — Telephone Encounter (Signed)
Patient called because she did not hear back from our office regarding the [place]. Mammogram imaging noted on 03/08/19 office visit.   Per note " Bilateral small breast nodule/cyst.  Right breast 1 x 1 cm at 7:00 close to the nipple.  Left breast 1 x 1.5 cm at 7:00 close to the nipple.  Both are tender to palpation.  No skin changes.  Nipples are normal.  No axillary lymph node felt.  Will schedule a bilateral diagnostic mammogram with bilateral breast ultrasound."  Orders placed at breast center of [facility], scheduled on 03/18/19 @ 10:15am. apologies given to patient as I was not sent the message to schedule this for her. Patient informed with appointment time and date.

## 2019-03-15 ENCOUNTER — Encounter: Payer: Self-pay | Admitting: Hematology

## 2019-03-16 ENCOUNTER — Inpatient Hospital Stay: Payer: Medicare Other

## 2019-03-16 ENCOUNTER — Telehealth: Payer: Self-pay | Admitting: Hematology & Oncology

## 2019-03-16 ENCOUNTER — Encounter: Payer: Self-pay | Admitting: *Deleted

## 2019-03-16 ENCOUNTER — Telehealth: Payer: Self-pay | Admitting: Hematology

## 2019-03-16 ENCOUNTER — Other Ambulatory Visit: Payer: Self-pay

## 2019-03-16 ENCOUNTER — Inpatient Hospital Stay (HOSPITAL_BASED_OUTPATIENT_CLINIC_OR_DEPARTMENT_OTHER): Payer: Medicare Other | Admitting: Hematology

## 2019-03-16 ENCOUNTER — Inpatient Hospital Stay: Payer: Medicare Other | Attending: Hematology

## 2019-03-16 ENCOUNTER — Encounter: Payer: Self-pay | Admitting: Hematology

## 2019-03-16 VITALS — BP 145/66 | HR 58 | Temp 97.1°F | Resp 16 | Ht 61.0 in | Wt 138.4 lb

## 2019-03-16 DIAGNOSIS — C8304 Small cell B-cell lymphoma, lymph nodes of axilla and upper limb: Secondary | ICD-10-CM | POA: Diagnosis not present

## 2019-03-16 DIAGNOSIS — Z881 Allergy status to other antibiotic agents status: Secondary | ICD-10-CM | POA: Diagnosis not present

## 2019-03-16 DIAGNOSIS — S82891D Other fracture of right lower leg, subsequent encounter for closed fracture with routine healing: Secondary | ICD-10-CM | POA: Insufficient documentation

## 2019-03-16 DIAGNOSIS — I700 Atherosclerosis of aorta: Secondary | ICD-10-CM | POA: Diagnosis not present

## 2019-03-16 DIAGNOSIS — Z5112 Encounter for antineoplastic immunotherapy: Secondary | ICD-10-CM | POA: Diagnosis not present

## 2019-03-16 DIAGNOSIS — C8580 Other specified types of non-Hodgkin lymphoma, unspecified site: Secondary | ICD-10-CM

## 2019-03-16 DIAGNOSIS — N179 Acute kidney failure, unspecified: Secondary | ICD-10-CM | POA: Diagnosis not present

## 2019-03-16 DIAGNOSIS — R599 Enlarged lymph nodes, unspecified: Secondary | ICD-10-CM | POA: Diagnosis not present

## 2019-03-16 DIAGNOSIS — Z79899 Other long term (current) drug therapy: Secondary | ICD-10-CM | POA: Insufficient documentation

## 2019-03-16 DIAGNOSIS — Z95828 Presence of other vascular implants and grafts: Secondary | ICD-10-CM

## 2019-03-16 DIAGNOSIS — Z888 Allergy status to other drugs, medicaments and biological substances status: Secondary | ICD-10-CM | POA: Insufficient documentation

## 2019-03-16 LAB — CBC WITH DIFFERENTIAL (CANCER CENTER ONLY)
Abs Immature Granulocytes: 0.04 10*3/uL (ref 0.00–0.07)
Basophils Absolute: 0.1 10*3/uL (ref 0.0–0.1)
Basophils Relative: 1 %
Eosinophils Absolute: 0.3 10*3/uL (ref 0.0–0.5)
Eosinophils Relative: 5 %
HCT: 36.4 % (ref 36.0–46.0)
Hemoglobin: 12 g/dL (ref 12.0–15.0)
Immature Granulocytes: 1 %
Lymphocytes Relative: 22 %
Lymphs Abs: 1.2 10*3/uL (ref 0.7–4.0)
MCH: 27 pg (ref 26.0–34.0)
MCHC: 33 g/dL (ref 30.0–36.0)
MCV: 81.8 fL (ref 80.0–100.0)
Monocytes Absolute: 0.9 10*3/uL (ref 0.1–1.0)
Monocytes Relative: 16 %
Neutro Abs: 3 10*3/uL (ref 1.7–7.7)
Neutrophils Relative %: 55 %
Platelet Count: 259 10*3/uL (ref 150–400)
RBC: 4.45 MIL/uL (ref 3.87–5.11)
RDW: 14.1 % (ref 11.5–15.5)
WBC Count: 5.4 10*3/uL (ref 4.0–10.5)
nRBC: 0 % (ref 0.0–0.2)

## 2019-03-16 LAB — CMP (CANCER CENTER ONLY)
ALT: 18 U/L (ref 0–44)
AST: 17 U/L (ref 15–41)
Albumin: 4 g/dL (ref 3.5–5.0)
Alkaline Phosphatase: 90 U/L (ref 38–126)
Anion gap: 6 (ref 5–15)
BUN: 22 mg/dL (ref 8–23)
CO2: 28 mmol/L (ref 22–32)
Calcium: 9.3 mg/dL (ref 8.9–10.3)
Chloride: 104 mmol/L (ref 98–111)
Creatinine: 0.88 mg/dL (ref 0.44–1.00)
GFR, Est AFR Am: >60 mL/min (ref >60)
GFR, Estimated: >60 mL/min (ref >60)
Glucose, Bld: 86 mg/dL (ref 70–99)
Potassium: 4.1 mmol/L (ref 3.5–5.1)
Sodium: 138 mmol/L (ref 135–145)
Total Bilirubin: 0.2 mg/dL — ABNORMAL LOW (ref 0.3–1.2)
Total Protein: 5.7 g/dL — ABNORMAL LOW (ref 6.5–8.1)

## 2019-03-16 LAB — LACTATE DEHYDROGENASE: LDH: 92 U/L — ABNORMAL LOW (ref 98–192)

## 2019-03-16 MED ORDER — SODIUM CHLORIDE 0.9 % IV SOLN
Freq: Once | INTRAVENOUS | Status: AC
  Filled 2019-03-16: qty 250

## 2019-03-16 MED ORDER — ACETAMINOPHEN 325 MG PO TABS
650.0000 mg | ORAL_TABLET | Freq: Once | ORAL | Status: AC
  Administered 2019-03-16: 11:00:00 650 mg via ORAL

## 2019-03-16 MED ORDER — HEPARIN SOD (PORK) LOCK FLUSH 100 UNIT/ML IV SOLN
500.0000 [IU] | Freq: Once | INTRAVENOUS | Status: DC | PRN
  Filled 2019-03-16: qty 5

## 2019-03-16 MED ORDER — SODIUM CHLORIDE 0.9% FLUSH
10.0000 mL | INTRAVENOUS | Status: DC | PRN
  Filled 2019-03-16: qty 10

## 2019-03-16 MED ORDER — ACETAMINOPHEN 325 MG PO TABS
ORAL_TABLET | ORAL | Status: AC
  Filled 2019-03-16: qty 2

## 2019-03-16 MED ORDER — HEPARIN SOD (PORK) LOCK FLUSH 100 UNIT/ML IV SOLN
500.0000 [IU] | Freq: Once | INTRAVENOUS | Status: DC
  Filled 2019-03-16: qty 5

## 2019-03-16 MED ORDER — DIPHENHYDRAMINE HCL 25 MG PO CAPS: 50.0000 mg | ORAL_CAPSULE | Freq: Once | ORAL | Status: DC

## 2019-03-16 MED ORDER — SODIUM CHLORIDE 0.9 % IV SOLN
375.0000 mg/m2 | Freq: Once | INTRAVENOUS | Status: DC
  Filled 2019-03-16: qty 60

## 2019-03-16 NOTE — Telephone Encounter (Signed)
No other changes in appt per  1/12 los

## 2019-03-16 NOTE — Patient Instructions (Signed)
Rituximab injection What is this medicine? RITUXIMAB (ri TUX i mab) is a monoclonal antibody. It is used to treat certain types of cancer like non-Hodgkin lymphoma and chronic lymphocytic leukemia. It is also used to treat rheumatoid arthritis, granulomatosis with polyangiitis (or Wegener's granulomatosis), microscopic polyangiitis, and pemphigus vulgaris. This medicine may be used for other purposes; ask your health care provider or pharmacist if you have questions. COMMON BRAND NAME(S): Rituxan, RUXIENCE What should I tell my health care provider before I take this medicine? They need to know if you have any of these conditions:  heart disease  infection (especially a virus infection such as hepatitis B, chickenpox, cold sores, or herpes)  immune system problems  irregular heartbeat  kidney disease  low blood counts, like low white cell, platelet, or red cell counts  lung or breathing disease, like asthma  recently received or scheduled to receive a vaccine  an unusual or allergic reaction to rituximab, other medicines, foods, dyes, or preservatives  pregnant or trying to get pregnant  breast-feeding How should I use this medicine? This medicine is for infusion into a vein. It is administered in a hospital or clinic by a specially trained health care professional. A special MedGuide will be given to you by the pharmacist with each prescription and refill. Be sure to read this information carefully each time. Talk to your pediatrician regarding the use of this medicine in children. This medicine is not approved for use in children. Overdosage: If you think you have taken too much of this medicine contact a poison control center or emergency room at once. NOTE: This medicine is only for you. Do not share this medicine with others. What if I miss a dose? It is important not to miss a dose. Call your doctor or health care professional if you are unable to keep an appointment. What  may interact with this medicine?  cisplatin  live virus vaccines This list may not describe all possible interactions. Give your health care provider a list of all the medicines, herbs, non-prescription drugs, or dietary supplements you use. Also tell them if you smoke, drink alcohol, or use illegal drugs. Some items may interact with your medicine. What should I watch for while using this medicine? Your condition will be monitored carefully while you are receiving this medicine. You may need blood work done while you are taking this medicine. This medicine can cause serious allergic reactions. To reduce your risk you may need to take medicine before treatment with this medicine. Take your medicine as directed. In some patients, this medicine may cause a serious brain infection that may cause death. If you have any problems seeing, thinking, speaking, walking, or standing, tell your healthcare professional right away. If you cannot reach your healthcare professional, urgently seek other source of medical care. Call your doctor or health care professional for advice if you get a fever, chills or sore throat, or other symptoms of a cold or flu. Do not treat yourself. This drug decreases your body's ability to fight infections. Try to avoid being around people who are sick. Do not become pregnant while taking this medicine or for at least 12 months after stopping it. Women should inform their doctor if they wish to become pregnant or think they might be pregnant. There is a potential for serious side effects to an unborn child. Talk to your health care professional or pharmacist for more information. Do not breast-feed an infant while taking this medicine or for at   least 6 months after stopping it. What side effects may I notice from receiving this medicine? Side effects that you should report to your doctor or health care professional as soon as possible:  allergic reactions like skin rash, itching or  hives; swelling of the face, lips, or tongue  breathing problems  chest pain  changes in vision  diarrhea  headache with fever, neck stiffness, sensitivity to light, nausea, or confusion  fast, irregular heartbeat  loss of memory  low blood counts - this medicine may decrease the number of white blood cells, red blood cells and platelets. You may be at increased risk for infections and bleeding.  mouth sores  problems with balance, talking, or walking  redness, blistering, peeling or loosening of the skin, including inside the mouth  signs of infection - fever or chills, cough, sore throat, pain or difficulty passing urine  signs and symptoms of kidney injury like trouble passing urine or change in the amount of urine  signs and symptoms of liver injury like dark yellow or brown urine; general ill feeling or flu-like symptoms; light-colored stools; loss of appetite; nausea; right upper belly pain; unusually weak or tired; yellowing of the eyes or skin  signs and symptoms of low blood pressure like dizziness; feeling faint or lightheaded, falls; unusually weak or tired  stomach pain  swelling of the ankles, feet, hands  unusual bleeding or bruising  vomiting Side effects that usually do not require medical attention (report to your doctor or health care professional if they continue or are bothersome):  headache  joint pain  muscle cramps or muscle pain  nausea  tiredness This list may not describe all possible side effects. Call your doctor for medical advice about side effects. You may report side effects to FDA at 1-800-FDA-1088. Where should I keep my medicine? This drug is given in a hospital or clinic and will not be stored at home. NOTE: This sheet is a summary. It may not cover all possible information. If you have questions about this medicine, talk to your doctor, pharmacist, or health care provider.  2020 Elsevier/Gold Standard (2018-04-01  22:01:36)  
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
## 2019-03-16 NOTE — Progress Notes (Signed)
Patient returning 03/17/19 for Ruxience infusion due to higher patient volumes and longer wait times today. She has a funeral to attend early this afternoon.  Hardie Pulley, PharmD, BCPS, BCOP

## 2019-03-16 NOTE — Progress Notes (Unsigned)
Do not have to wait for C-Met results to give Rituxin today per Dr. Maylon Peppers.

## 2019-03-16 NOTE — Telephone Encounter (Signed)
Infusion appt for 1/12 had to be moved to 1/13 due to funeral.  This was OK per Hilario Quarry to add on for 1/13. Patient is aware of date/time.

## 2019-03-17 ENCOUNTER — Inpatient Hospital Stay: Payer: Medicare Other

## 2019-03-17 VITALS — BP 154/61 | HR 61 | Temp 97.3°F

## 2019-03-17 DIAGNOSIS — N179 Acute kidney failure, unspecified: Secondary | ICD-10-CM | POA: Diagnosis not present

## 2019-03-17 DIAGNOSIS — C8580 Other specified types of non-Hodgkin lymphoma, unspecified site: Secondary | ICD-10-CM

## 2019-03-17 DIAGNOSIS — C8304 Small cell B-cell lymphoma, lymph nodes of axilla and upper limb: Secondary | ICD-10-CM | POA: Diagnosis not present

## 2019-03-17 DIAGNOSIS — I700 Atherosclerosis of aorta: Secondary | ICD-10-CM | POA: Diagnosis not present

## 2019-03-17 DIAGNOSIS — Z5112 Encounter for antineoplastic immunotherapy: Secondary | ICD-10-CM | POA: Diagnosis not present

## 2019-03-17 DIAGNOSIS — S82891D Other fracture of right lower leg, subsequent encounter for closed fracture with routine healing: Secondary | ICD-10-CM | POA: Diagnosis not present

## 2019-03-17 MED ORDER — HEPARIN SOD (PORK) LOCK FLUSH 100 UNIT/ML IV SOLN
500.0000 [IU] | Freq: Once | INTRAVENOUS | Status: AC | PRN
  Administered 2019-03-17: 12:00:00 500 [IU]
  Filled 2019-03-17: qty 5

## 2019-03-17 MED ORDER — SODIUM CHLORIDE 0.9 % IV SOLN
Freq: Once | INTRAVENOUS | Status: AC
  Filled 2019-03-17: qty 250

## 2019-03-17 MED ORDER — SODIUM CHLORIDE 0.9% FLUSH
10.0000 mL | INTRAVENOUS | Status: DC | PRN
  Administered 2019-03-17: 10 mL
  Filled 2019-03-17: qty 10

## 2019-03-17 MED ORDER — ACETAMINOPHEN 325 MG PO TABS: 650.0000 mg | ORAL_TABLET | Freq: Once | ORAL | Status: DC

## 2019-03-17 MED ORDER — DIPHENHYDRAMINE HCL 25 MG PO CAPS: 50.0000 mg | ORAL_CAPSULE | Freq: Once | ORAL | Status: DC

## 2019-03-17 MED ORDER — SODIUM CHLORIDE 0.9 % IV SOLN
375.0000 mg/m2 | Freq: Once | INTRAVENOUS | Status: AC
  Administered 2019-03-17: 600 mg via INTRAVENOUS
  Filled 2019-03-17: qty 50

## 2019-03-17 NOTE — Patient Instructions (Signed)
Rituximab injection What is this medicine? RITUXIMAB (ri TUX i mab) is a monoclonal antibody. It is used to treat certain types of cancer like non-Hodgkin lymphoma and chronic lymphocytic leukemia. It is also used to treat rheumatoid arthritis, granulomatosis with polyangiitis (or Wegener's granulomatosis), microscopic polyangiitis, and pemphigus vulgaris. This medicine may be used for other purposes; ask your health care provider or pharmacist if you have questions. COMMON BRAND NAME(S): Rituxan, RUXIENCE What should I tell my health care provider before I take this medicine? They need to know if you have any of these conditions:  heart disease  infection (especially a virus infection such as hepatitis B, chickenpox, cold sores, or herpes)  immune system problems  irregular heartbeat  kidney disease  low blood counts, like low white cell, platelet, or red cell counts  lung or breathing disease, like asthma  recently received or scheduled to receive a vaccine  an unusual or allergic reaction to rituximab, other medicines, foods, dyes, or preservatives  pregnant or trying to get pregnant  breast-feeding How should I use this medicine? This medicine is for infusion into a vein. It is administered in a hospital or clinic by a specially trained health care professional. A special MedGuide will be given to you by the pharmacist with each prescription and refill. Be sure to read this information carefully each time. Talk to your pediatrician regarding the use of this medicine in children. This medicine is not approved for use in children. Overdosage: If you think you have taken too much of this medicine contact a poison control center or emergency room at once. NOTE: This medicine is only for you. Do not share this medicine with others. What if I miss a dose? It is important not to miss a dose. Call your doctor or health care professional if you are unable to keep an appointment. What  may interact with this medicine?  cisplatin  live virus vaccines This list may not describe all possible interactions. Give your health care provider a list of all the medicines, herbs, non-prescription drugs, or dietary supplements you use. Also tell them if you smoke, drink alcohol, or use illegal drugs. Some items may interact with your medicine. What should I watch for while using this medicine? Your condition will be monitored carefully while you are receiving this medicine. You may need blood work done while you are taking this medicine. This medicine can cause serious allergic reactions. To reduce your risk you may need to take medicine before treatment with this medicine. Take your medicine as directed. In some patients, this medicine may cause a serious brain infection that may cause death. If you have any problems seeing, thinking, speaking, walking, or standing, tell your healthcare professional right away. If you cannot reach your healthcare professional, urgently seek other source of medical care. Call your doctor or health care professional for advice if you get a fever, chills or sore throat, or other symptoms of a cold or flu. Do not treat yourself. This drug decreases your body's ability to fight infections. Try to avoid being around people who are sick. Do not become pregnant while taking this medicine or for at least 12 months after stopping it. Women should inform their doctor if they wish to become pregnant or think they might be pregnant. There is a potential for serious side effects to an unborn child. Talk to your health care professional or pharmacist for more information. Do not breast-feed an infant while taking this medicine or for at   least 6 months after stopping it. What side effects may I notice from receiving this medicine? Side effects that you should report to your doctor or health care professional as soon as possible:  allergic reactions like skin rash, itching or  hives; swelling of the face, lips, or tongue  breathing problems  chest pain  changes in vision  diarrhea  headache with fever, neck stiffness, sensitivity to light, nausea, or confusion  fast, irregular heartbeat  loss of memory  low blood counts - this medicine may decrease the number of white blood cells, red blood cells and platelets. You may be at increased risk for infections and bleeding.  mouth sores  problems with balance, talking, or walking  redness, blistering, peeling or loosening of the skin, including inside the mouth  signs of infection - fever or chills, cough, sore throat, pain or difficulty passing urine  signs and symptoms of kidney injury like trouble passing urine or change in the amount of urine  signs and symptoms of liver injury like dark yellow or brown urine; general ill feeling or flu-like symptoms; light-colored stools; loss of appetite; nausea; right upper belly pain; unusually weak or tired; yellowing of the eyes or skin  signs and symptoms of low blood pressure like dizziness; feeling faint or lightheaded, falls; unusually weak or tired  stomach pain  swelling of the ankles, feet, hands  unusual bleeding or bruising  vomiting Side effects that usually do not require medical attention (report to your doctor or health care professional if they continue or are bothersome):  headache  joint pain  muscle cramps or muscle pain  nausea  tiredness This list may not describe all possible side effects. Call your doctor for medical advice about side effects. You may report side effects to FDA at 1-800-FDA-1088. Where should I keep my medicine? This drug is given in a hospital or clinic and will not be stored at home. NOTE: This sheet is a summary. It may not cover all possible information. If you have questions about this medicine, talk to your doctor, pharmacist, or health care provider.  2020 Elsevier/Gold Standard (2018-04-01  22:01:36)  

## 2019-03-18 ENCOUNTER — Ambulatory Visit: Payer: Medicare Other

## 2019-03-18 ENCOUNTER — Ambulatory Visit
Admission: RE | Admit: 2019-03-18 | Discharge: 2019-03-18 | Disposition: A | Discharge status: Home / Self Care | Payer: Medicare Other | Source: Ambulatory Visit | Attending: Obstetrics & Gynecology | Admitting: Obstetrics & Gynecology

## 2019-03-18 ENCOUNTER — Other Ambulatory Visit: Payer: Self-pay

## 2019-03-18 ENCOUNTER — Other Ambulatory Visit: Payer: Self-pay | Admitting: Obstetrics & Gynecology

## 2019-03-18 DIAGNOSIS — N6310 Unspecified lump in the right breast, unspecified quadrant: Secondary | ICD-10-CM

## 2019-03-18 DIAGNOSIS — N644 Mastodynia: Secondary | ICD-10-CM | POA: Diagnosis not present

## 2019-03-18 DIAGNOSIS — N6312 Unspecified lump in the right breast, upper inner quadrant: Secondary | ICD-10-CM | POA: Diagnosis not present

## 2019-03-18 DIAGNOSIS — N6320 Unspecified lump in the left breast, unspecified quadrant: Secondary | ICD-10-CM

## 2019-03-18 DIAGNOSIS — R928 Other abnormal and inconclusive findings on diagnostic imaging of breast: Secondary | ICD-10-CM | POA: Diagnosis not present

## 2019-03-23 DIAGNOSIS — S32011A Stable burst fracture of first lumbar vertebra, initial encounter for closed fracture: Secondary | ICD-10-CM | POA: Diagnosis not present

## 2019-03-30 DIAGNOSIS — M545 Low back pain: Secondary | ICD-10-CM | POA: Diagnosis not present

## 2019-03-30 DIAGNOSIS — S32011A Stable burst fracture of first lumbar vertebra, initial encounter for closed fracture: Secondary | ICD-10-CM | POA: Diagnosis not present

## 2019-04-05 DIAGNOSIS — R030 Elevated blood-pressure reading, without diagnosis of hypertension: Secondary | ICD-10-CM | POA: Diagnosis not present

## 2019-04-05 DIAGNOSIS — Z6825 Body mass index (BMI) 25.0-25.9, adult: Secondary | ICD-10-CM | POA: Diagnosis not present

## 2019-04-05 DIAGNOSIS — S32011A Stable burst fracture of first lumbar vertebra, initial encounter for closed fracture: Secondary | ICD-10-CM | POA: Diagnosis not present

## 2019-04-14 DIAGNOSIS — D2371 Other benign neoplasm of skin of right lower limb, including hip: Secondary | ICD-10-CM | POA: Diagnosis not present

## 2019-04-14 DIAGNOSIS — D485 Neoplasm of uncertain behavior of skin: Secondary | ICD-10-CM | POA: Diagnosis not present

## 2019-04-14 DIAGNOSIS — B353 Tinea pedis: Secondary | ICD-10-CM | POA: Diagnosis not present

## 2019-04-14 DIAGNOSIS — L578 Other skin changes due to chronic exposure to nonionizing radiation: Secondary | ICD-10-CM | POA: Diagnosis not present

## 2019-04-14 DIAGNOSIS — Z23 Encounter for immunization: Secondary | ICD-10-CM | POA: Diagnosis not present

## 2019-04-14 DIAGNOSIS — L570 Actinic keratosis: Secondary | ICD-10-CM | POA: Diagnosis not present

## 2019-04-14 DIAGNOSIS — C44519 Basal cell carcinoma of skin of other part of trunk: Secondary | ICD-10-CM | POA: Diagnosis not present

## 2019-04-14 DIAGNOSIS — L821 Other seborrheic keratosis: Secondary | ICD-10-CM | POA: Diagnosis not present

## 2019-04-14 DIAGNOSIS — Z85828 Personal history of other malignant neoplasm of skin: Secondary | ICD-10-CM | POA: Diagnosis not present

## 2019-04-14 DIAGNOSIS — Z86018 Personal history of other benign neoplasm: Secondary | ICD-10-CM | POA: Diagnosis not present

## 2019-04-20 ENCOUNTER — Encounter: Payer: Self-pay | Admitting: *Deleted

## 2019-05-11 ENCOUNTER — Encounter: Payer: Self-pay | Admitting: Family Medicine
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
## 2019-05-13 NOTE — Telephone Encounter (Signed)
Dr. Etter Sjogren Can you confirm  If he should go to discharge clinic or Respiratory Clinic. For [name]? Please thanks

## 2019-05-13 NOTE — Telephone Encounter (Signed)
Can one of you get her on the schedule for the [place] [facility]

## 2019-05-13 NOTE — Telephone Encounter (Signed)
resp clinic  i'm not aware of a d/c clinic

## 2019-05-17 ENCOUNTER — Other Ambulatory Visit: Payer: Self-pay

## 2019-05-17 ENCOUNTER — Emergency Department (HOSPITAL_COMMUNITY): Payer: Medicare Other

## 2019-05-17 ENCOUNTER — Encounter (HOSPITAL_COMMUNITY): Payer: Self-pay | Admitting: Emergency Medicine

## 2019-05-17 ENCOUNTER — Encounter (HOSPITAL_BASED_OUTPATIENT_CLINIC_OR_DEPARTMENT_OTHER): Payer: Self-pay | Admitting: *Deleted

## 2019-05-17 ENCOUNTER — Emergency Department (HOSPITAL_BASED_OUTPATIENT_CLINIC_OR_DEPARTMENT_OTHER)
Admission: EM | Admit: 2019-05-17 | Discharge: 2019-05-17 | Disposition: A | Discharge status: Home / Self Care | Payer: Medicare Other | Source: Home / Self Care | Attending: Emergency Medicine | Admitting: Emergency Medicine

## 2019-05-17 ENCOUNTER — Emergency Department (HOSPITAL_BASED_OUTPATIENT_CLINIC_OR_DEPARTMENT_OTHER): Payer: Medicare Other

## 2019-05-17 ENCOUNTER — Inpatient Hospital Stay (HOSPITAL_COMMUNITY)
Admission: EM | Admit: 2019-05-17 | Discharge: 2019-05-21 | DRG: 177 | Disposition: A | Discharge status: Home Health | Payer: Medicare Other | Attending: Internal Medicine | Admitting: Internal Medicine

## 2019-05-17 DIAGNOSIS — I129 Hypertensive chronic kidney disease with stage 1 through stage 4 chronic kidney disease, or unspecified chronic kidney disease: Secondary | ICD-10-CM | POA: Diagnosis present

## 2019-05-17 DIAGNOSIS — H3530 Unspecified macular degeneration: Secondary | ICD-10-CM | POA: Diagnosis present

## 2019-05-17 DIAGNOSIS — M542 Cervicalgia: Secondary | ICD-10-CM | POA: Diagnosis present

## 2019-05-17 DIAGNOSIS — D849 Immunodeficiency, unspecified: Secondary | ICD-10-CM | POA: Diagnosis present

## 2019-05-17 DIAGNOSIS — Z8572 Personal history of non-Hodgkin lymphomas: Secondary | ICD-10-CM

## 2019-05-17 DIAGNOSIS — U071 COVID-19: Secondary | ICD-10-CM

## 2019-05-17 DIAGNOSIS — K219 Gastro-esophageal reflux disease without esophagitis: Secondary | ICD-10-CM | POA: Diagnosis present

## 2019-05-17 DIAGNOSIS — M1990 Unspecified osteoarthritis, unspecified site: Secondary | ICD-10-CM | POA: Diagnosis present

## 2019-05-17 DIAGNOSIS — Z7982 Long term (current) use of aspirin: Secondary | ICD-10-CM | POA: Diagnosis not present

## 2019-05-17 DIAGNOSIS — Z888 Allergy status to other drugs, medicaments and biological substances status: Secondary | ICD-10-CM | POA: Diagnosis not present

## 2019-05-17 DIAGNOSIS — Z209 Contact with and (suspected) exposure to unspecified communicable disease: Secondary | ICD-10-CM | POA: Diagnosis not present

## 2019-05-17 DIAGNOSIS — Z85828 Personal history of other malignant neoplasm of skin: Secondary | ICD-10-CM | POA: Diagnosis not present

## 2019-05-17 DIAGNOSIS — I10 Essential (primary) hypertension: Secondary | ICD-10-CM | POA: Diagnosis present

## 2019-05-17 DIAGNOSIS — M8580 Other specified disorders of bone density and structure, unspecified site: Secondary | ICD-10-CM | POA: Diagnosis present

## 2019-05-17 DIAGNOSIS — J1282 Pneumonia due to coronavirus disease 2019: Secondary | ICD-10-CM | POA: Diagnosis present

## 2019-05-17 DIAGNOSIS — N183 Chronic kidney disease, stage 3 unspecified: Secondary | ICD-10-CM | POA: Diagnosis present

## 2019-05-17 DIAGNOSIS — Z79891 Long term (current) use of opiate analgesic: Secondary | ICD-10-CM | POA: Diagnosis not present

## 2019-05-17 DIAGNOSIS — K5909 Other constipation: Secondary | ICD-10-CM | POA: Diagnosis present

## 2019-05-17 DIAGNOSIS — R627 Adult failure to thrive: Secondary | ICD-10-CM | POA: Diagnosis present

## 2019-05-17 DIAGNOSIS — R531 Weakness: Secondary | ICD-10-CM | POA: Diagnosis not present

## 2019-05-17 DIAGNOSIS — E039 Hypothyroidism, unspecified: Secondary | ICD-10-CM | POA: Diagnosis present

## 2019-05-17 DIAGNOSIS — S199XXA Unspecified injury of neck, initial encounter: Secondary | ICD-10-CM | POA: Diagnosis not present

## 2019-05-17 DIAGNOSIS — Z79899 Other long term (current) drug therapy: Secondary | ICD-10-CM

## 2019-05-17 DIAGNOSIS — M545 Low back pain: Secondary | ICD-10-CM | POA: Diagnosis not present

## 2019-05-17 DIAGNOSIS — M549 Dorsalgia, unspecified: Secondary | ICD-10-CM | POA: Diagnosis present

## 2019-05-17 DIAGNOSIS — E876 Hypokalemia: Secondary | ICD-10-CM | POA: Diagnosis not present

## 2019-05-17 DIAGNOSIS — Y92410 Unspecified street and highway as the place of occurrence of the external cause: Secondary | ICD-10-CM | POA: Diagnosis not present

## 2019-05-17 DIAGNOSIS — R0602 Shortness of breath: Secondary | ICD-10-CM | POA: Insufficient documentation

## 2019-05-17 DIAGNOSIS — R05 Cough: Secondary | ICD-10-CM | POA: Diagnosis not present

## 2019-05-17 DIAGNOSIS — I959 Hypotension, unspecified: Secondary | ICD-10-CM | POA: Diagnosis not present

## 2019-05-17 DIAGNOSIS — S060X1A Concussion with loss of consciousness of 30 minutes or less, initial encounter: Secondary | ICD-10-CM | POA: Diagnosis not present

## 2019-05-17 DIAGNOSIS — E785 Hyperlipidemia, unspecified: Secondary | ICD-10-CM | POA: Diagnosis present

## 2019-05-17 DIAGNOSIS — Z79890 Hormone replacement therapy (postmenopausal): Secondary | ICD-10-CM | POA: Diagnosis not present

## 2019-05-17 DIAGNOSIS — N393 Stress incontinence (female) (male): Secondary | ICD-10-CM | POA: Diagnosis present

## 2019-05-17 DIAGNOSIS — R112 Nausea with vomiting, unspecified: Secondary | ICD-10-CM | POA: Diagnosis not present

## 2019-05-17 DIAGNOSIS — Z20822 Contact with and (suspected) exposure to covid-19: Secondary | ICD-10-CM

## 2019-05-17 DIAGNOSIS — Z8249 Family history of ischemic heart disease and other diseases of the circulatory system: Secondary | ICD-10-CM

## 2019-05-17 DIAGNOSIS — Z6823 Body mass index (BMI) 23.0-23.9, adult: Secondary | ICD-10-CM

## 2019-05-17 DIAGNOSIS — R55 Syncope and collapse: Secondary | ICD-10-CM | POA: Diagnosis not present

## 2019-05-17 LAB — D-DIMER, QUANTITATIVE: D-Dimer, Quant: 1.99 ug/mL-FEU — ABNORMAL HIGH (ref 0.00–0.50)

## 2019-05-17 LAB — COMPREHENSIVE METABOLIC PANEL
ALT: 15 U/L (ref 0–44)
AST: 25 U/L (ref 15–41)
Albumin: 3.7 g/dL (ref 3.5–5.0)
Alkaline Phosphatase: 64 U/L (ref 38–126)
Anion gap: 13 (ref 5–15)
BUN: 19 mg/dL (ref 8–23)
CO2: 23 mmol/L (ref 22–32)
Calcium: 8.5 mg/dL — ABNORMAL LOW (ref 8.9–10.3)
Chloride: 101 mmol/L (ref 98–111)
Creatinine, Ser: 0.97 mg/dL (ref 0.44–1.00)
GFR calc Af Amer: >60 mL/min (ref >60)
GFR calc non Af Amer: 55 mL/min — ABNORMAL LOW (ref >60)
Glucose, Bld: 109 mg/dL — ABNORMAL HIGH (ref 70–99)
Potassium: 3.3 mmol/L — ABNORMAL LOW (ref 3.5–5.1)
Sodium: 137 mmol/L (ref 135–145)
Total Bilirubin: 0.3 mg/dL (ref 0.3–1.2)
Total Protein: 6.5 g/dL (ref 6.5–8.1)

## 2019-05-17 LAB — CBC WITH DIFFERENTIAL/PLATELET
Abs Immature Granulocytes: 0.05 10*3/uL (ref 0.00–0.07)
Abs Immature Granulocytes: 0.02 10*3/uL (ref 0.00–0.07)
Basophils Absolute: 0 10*3/uL (ref 0.0–0.1)
Basophils Absolute: 0 10*3/uL (ref 0.0–0.1)
Basophils Relative: 0 %
Basophils Relative: 0 %
Eosinophils Absolute: 0 10*3/uL (ref 0.0–0.5)
Eosinophils Absolute: 0 10*3/uL (ref 0.0–0.5)
Eosinophils Relative: 0 %
Eosinophils Relative: 0 %
HCT: 38.8 % (ref 36.0–46.0)
HCT: 35 % — ABNORMAL LOW (ref 36.0–46.0)
Hemoglobin: 12.9 g/dL (ref 12.0–15.0)
Hemoglobin: 11.3 g/dL — ABNORMAL LOW (ref 12.0–15.0)
Immature Granulocytes: 1 %
Immature Granulocytes: 0 %
Lymphocytes Relative: 8 %
Lymphocytes Relative: 9 %
Lymphs Abs: 0.5 10*3/uL — ABNORMAL LOW (ref 0.7–4.0)
Lymphs Abs: 0.5 10*3/uL — ABNORMAL LOW (ref 0.7–4.0)
MCH: 26.7 pg (ref 26.0–34.0)
MCH: 26.2 pg (ref 26.0–34.0)
MCHC: 33.2 g/dL (ref 30.0–36.0)
MCHC: 32.3 g/dL (ref 30.0–36.0)
MCV: 80.2 fL (ref 80.0–100.0)
MCV: 81.2 fL (ref 80.0–100.0)
Monocytes Absolute: 0.9 10*3/uL (ref 0.1–1.0)
Monocytes Absolute: 0.8 10*3/uL (ref 0.1–1.0)
Monocytes Relative: 15 %
Monocytes Relative: 16 %
Neutro Abs: 4.6 10*3/uL (ref 1.7–7.7)
Neutro Abs: 4 10*3/uL (ref 1.7–7.7)
Neutrophils Relative %: 76 %
Neutrophils Relative %: 75 %
Platelets: 237 10*3/uL (ref 150–400)
Platelets: 210 10*3/uL (ref 150–400)
RBC: 4.84 MIL/uL (ref 3.87–5.11)
RBC: 4.31 MIL/uL (ref 3.87–5.11)
RDW: 14.3 % (ref 11.5–15.5)
RDW: 14.2 % (ref 11.5–15.5)
WBC: 6 10*3/uL (ref 4.0–10.5)
WBC: 5.3 10*3/uL (ref 4.0–10.5)
nRBC: 0 % (ref 0.0–0.2)
nRBC: 0 % (ref 0.0–0.2)

## 2019-05-17 LAB — SARS CORONAVIRUS 2 (TAT 6-24 HRS): SARS Coronavirus 2: POSITIVE — AB

## 2019-05-17 LAB — BASIC METABOLIC PANEL
Anion gap: 15 (ref 5–15)
BUN: 19 mg/dL (ref 8–23)
CO2: 19 mmol/L — ABNORMAL LOW (ref 22–32)
Calcium: 7.8 mg/dL — ABNORMAL LOW (ref 8.9–10.3)
Chloride: 104 mmol/L (ref 98–111)
Creatinine, Ser: 1.04 mg/dL — ABNORMAL HIGH (ref 0.44–1.00)
GFR calc Af Amer: 59 mL/min — ABNORMAL LOW (ref >60)
GFR calc non Af Amer: 51 mL/min — ABNORMAL LOW (ref >60)
Glucose, Bld: 107 mg/dL — ABNORMAL HIGH (ref 70–99)
Potassium: 3.3 mmol/L — ABNORMAL LOW (ref 3.5–5.1)
Sodium: 138 mmol/L (ref 135–145)

## 2019-05-17 LAB — FERRITIN: Ferritin: 777 ng/mL — ABNORMAL HIGH (ref 11–307)

## 2019-05-17 LAB — TROPONIN I (HIGH SENSITIVITY)
Troponin I (High Sensitivity): 26 ng/L — ABNORMAL HIGH (ref <18)
Troponin I (High Sensitivity): 36 ng/L — ABNORMAL HIGH (ref <18)

## 2019-05-17 LAB — POC SARS CORONAVIRUS 2 AG -  ED: SARS Coronavirus 2 Ag: POSITIVE — AB

## 2019-05-17 LAB — PROCALCITONIN: Procalcitonin: <0.1 ng/mL

## 2019-05-17 LAB — LACTATE DEHYDROGENASE: LDH: 217 U/L — ABNORMAL HIGH (ref 98–192)

## 2019-05-17 LAB — TRIGLYCERIDES: Triglycerides: 115 mg/dL (ref <150)

## 2019-05-17 LAB — FIBRINOGEN: Fibrinogen: 659 mg/dL — ABNORMAL HIGH (ref 210–475)

## 2019-05-17 LAB — C-REACTIVE PROTEIN: CRP: 7.5 mg/dL — ABNORMAL HIGH (ref <1.0)

## 2019-05-17 LAB — LACTIC ACID, PLASMA: Lactic Acid, Venous: 1.5 mmol/L (ref 0.5–1.9)

## 2019-05-17 MED ORDER — SODIUM CHLORIDE 0.9 % IV SOLN
100.0000 mg | Freq: Every day | INTRAVENOUS | Status: AC
  Administered 2019-05-18 – 2019-05-21 (×4): 100 mg via INTRAVENOUS
  Filled 2019-05-17 (×5): qty 20

## 2019-05-17 MED ORDER — ONDANSETRON HCL 4 MG PO TABS
4.0000 mg | ORAL_TABLET | Freq: Four times a day (QID) | ORAL | Status: DC | PRN
  Administered 2019-05-21: 4 mg via ORAL
  Filled 2019-05-17: qty 1

## 2019-05-17 MED ORDER — DEXAMETHASONE SODIUM PHOSPHATE 10 MG/ML IJ SOLN
10.0000 mg | Freq: Once | INTRAMUSCULAR | Status: AC
  Administered 2019-05-17: 10 mg via INTRAVENOUS
  Filled 2019-05-17: qty 1

## 2019-05-17 MED ORDER — ACETAMINOPHEN-CODEINE #3 300-30 MG PO TABS: 1.0000 | ORAL_TABLET | Freq: Four times a day (QID) | ORAL | 0 refills | Status: DC | PRN

## 2019-05-17 MED ORDER — GUAIFENESIN-DM 100-10 MG/5ML PO SYRP
10.0000 mL | ORAL_SOLUTION | ORAL | Status: DC | PRN
  Administered 2019-05-19 – 2019-05-21 (×2): 10 mL via ORAL
  Filled 2019-05-17 (×2): qty 10

## 2019-05-17 MED ORDER — ACETAMINOPHEN-CODEINE #3 300-30 MG PO TABS
1.0000 | ORAL_TABLET | Freq: Once | ORAL | Status: AC
  Administered 2019-05-17: 1 via ORAL
  Filled 2019-05-17: qty 1

## 2019-05-17 MED ORDER — ACETAMINOPHEN 325 MG PO TABS
650.0000 mg | ORAL_TABLET | Freq: Four times a day (QID) | ORAL | Status: DC | PRN
  Administered 2019-05-17 – 2019-05-19 (×2): 650 mg via ORAL
  Filled 2019-05-17 (×2): qty 2

## 2019-05-17 MED ORDER — ONDANSETRON 8 MG PO TBDP: 8.0000 mg | ORAL_TABLET | Freq: Three times a day (TID) | ORAL | 0 refills | Status: DC | PRN

## 2019-05-17 MED ORDER — ONDANSETRON 4 MG PO TBDP: 4.0000 mg | ORAL_TABLET | Freq: Once | ORAL | Status: DC

## 2019-05-17 MED ORDER — ENOXAPARIN SODIUM 40 MG/0.4ML ~~LOC~~ SOLN
40.0000 mg | SUBCUTANEOUS | Status: DC
  Administered 2019-05-17 – 2019-05-20 (×4): 40 mg via SUBCUTANEOUS
  Filled 2019-05-17 (×4): qty 0.4

## 2019-05-17 MED ORDER — SODIUM CHLORIDE 0.9 % IV BOLUS
500.0000 mL | Freq: Once | INTRAVENOUS | Status: AC
  Administered 2019-05-17: 500 mL via INTRAVENOUS

## 2019-05-17 MED ORDER — HYDROCOD POLST-CPM POLST ER 10-8 MG/5ML PO SUER: 5.0000 mL | Freq: Two times a day (BID) | ORAL | Status: DC | PRN

## 2019-05-17 MED ORDER — ONDANSETRON HCL 4 MG/2ML IJ SOLN
4.0000 mg | Freq: Four times a day (QID) | INTRAMUSCULAR | Status: DC | PRN
  Administered 2019-05-18 – 2019-05-20 (×5): 4 mg via INTRAVENOUS
  Filled 2019-05-17 (×5): qty 2

## 2019-05-17 MED ORDER — ONDANSETRON 4 MG PO TBDP
ORAL_TABLET | ORAL | Status: AC
  Administered 2019-05-17: 4 mg
  Filled 2019-05-17: qty 1

## 2019-05-17 MED ORDER — GUAIFENESIN 100 MG/5ML PO LIQD: 100.0000 mg | Freq: Three times a day (TID) | ORAL | 0 refills | Status: DC | PRN

## 2019-05-17 MED ORDER — ALBUTEROL SULFATE HFA 108 (90 BASE) MCG/ACT IN AERS
6.0000 | INHALATION_SPRAY | Freq: Once | RESPIRATORY_TRACT | Status: AC
  Administered 2019-05-17: 6 via RESPIRATORY_TRACT
  Filled 2019-05-17: qty 6.7

## 2019-05-17 MED ORDER — DOXYCYCLINE HYCLATE 100 MG PO CAPS: 100.0000 mg | ORAL_CAPSULE | Freq: Two times a day (BID) | ORAL | 0 refills | Status: DC

## 2019-05-17 MED ORDER — ONDANSETRON HCL 8 MG PO TABS
8.0000 mg | ORAL_TABLET | Freq: Once | ORAL | Status: DC
  Filled 2019-05-17: qty 1

## 2019-05-17 MED ORDER — LEVOTHYROXINE SODIUM 25 MCG PO TABS
125.0000 ug | ORAL_TABLET | Freq: Every day | ORAL | Status: DC
  Administered 2019-05-18 – 2019-05-21 (×4): 125 ug via ORAL
  Filled 2019-05-17 (×4): qty 1

## 2019-05-17 MED ORDER — ONDANSETRON HCL 4 MG/2ML IJ SOLN
4.0000 mg | Freq: Once | INTRAMUSCULAR | Status: AC
  Administered 2019-05-17: 4 mg via INTRAVENOUS
  Filled 2019-05-17: qty 2

## 2019-05-17 MED ORDER — SODIUM CHLORIDE 0.9 % IV SOLN
100.0000 mg | INTRAVENOUS | Status: AC
  Administered 2019-05-17 (×2): 100 mg via INTRAVENOUS
  Filled 2019-05-17 (×2): qty 20
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
## 2019-05-17 NOTE — ED Notes (Signed)
RN completed orthostatic vitals, pt endorsed feeling dizzy, lightheaded, and nauseated, RN ambulated pt w/ pulse ox, O2 sat declined to 91% on RA, pt felt like she couldn't stand anymore and RN assisted pt back into the bed.
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
## 2019-05-17 NOTE — ED Notes (Signed)
Pt given water for fluid challenge 
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
## 2019-05-18 DIAGNOSIS — Y92410 Unspecified street and highway as the place of occurrence of the external cause: Secondary | ICD-10-CM | POA: Diagnosis not present

## 2019-05-18 DIAGNOSIS — J1282 Pneumonia due to coronavirus disease 2019: Secondary | ICD-10-CM | POA: Diagnosis present

## 2019-05-18 DIAGNOSIS — U071 COVID-19: Secondary | ICD-10-CM | POA: Diagnosis present

## 2019-05-18 DIAGNOSIS — R627 Adult failure to thrive: Secondary | ICD-10-CM | POA: Diagnosis present

## 2019-05-18 DIAGNOSIS — Z85828 Personal history of other malignant neoplasm of skin: Secondary | ICD-10-CM | POA: Diagnosis not present

## 2019-05-18 DIAGNOSIS — M8580 Other specified disorders of bone density and structure, unspecified site: Secondary | ICD-10-CM | POA: Diagnosis present

## 2019-05-18 DIAGNOSIS — Z8572 Personal history of non-Hodgkin lymphomas: Secondary | ICD-10-CM | POA: Diagnosis not present

## 2019-05-18 DIAGNOSIS — Z79891 Long term (current) use of opiate analgesic: Secondary | ICD-10-CM | POA: Diagnosis not present

## 2019-05-18 DIAGNOSIS — N183 Chronic kidney disease, stage 3 unspecified: Secondary | ICD-10-CM | POA: Diagnosis present

## 2019-05-18 DIAGNOSIS — D849 Immunodeficiency, unspecified: Secondary | ICD-10-CM | POA: Diagnosis present

## 2019-05-18 DIAGNOSIS — N393 Stress incontinence (female) (male): Secondary | ICD-10-CM | POA: Diagnosis present

## 2019-05-18 DIAGNOSIS — Z8249 Family history of ischemic heart disease and other diseases of the circulatory system: Secondary | ICD-10-CM | POA: Diagnosis not present

## 2019-05-18 DIAGNOSIS — M549 Dorsalgia, unspecified: Secondary | ICD-10-CM | POA: Diagnosis present

## 2019-05-18 DIAGNOSIS — K5909 Other constipation: Secondary | ICD-10-CM | POA: Diagnosis present

## 2019-05-18 DIAGNOSIS — H3530 Unspecified macular degeneration: Secondary | ICD-10-CM | POA: Diagnosis present

## 2019-05-18 DIAGNOSIS — Z79890 Hormone replacement therapy (postmenopausal): Secondary | ICD-10-CM | POA: Diagnosis not present

## 2019-05-18 DIAGNOSIS — E785 Hyperlipidemia, unspecified: Secondary | ICD-10-CM | POA: Diagnosis present

## 2019-05-18 DIAGNOSIS — M542 Cervicalgia: Secondary | ICD-10-CM | POA: Diagnosis present

## 2019-05-18 DIAGNOSIS — Z79899 Other long term (current) drug therapy: Secondary | ICD-10-CM | POA: Diagnosis not present

## 2019-05-18 DIAGNOSIS — Z888 Allergy status to other drugs, medicaments and biological substances status: Secondary | ICD-10-CM | POA: Diagnosis not present

## 2019-05-18 DIAGNOSIS — Z7982 Long term (current) use of aspirin: Secondary | ICD-10-CM | POA: Diagnosis not present

## 2019-05-18 DIAGNOSIS — I129 Hypertensive chronic kidney disease with stage 1 through stage 4 chronic kidney disease, or unspecified chronic kidney disease: Secondary | ICD-10-CM | POA: Diagnosis present

## 2019-05-18 DIAGNOSIS — I10 Essential (primary) hypertension: Secondary | ICD-10-CM | POA: Diagnosis not present

## 2019-05-18 DIAGNOSIS — E039 Hypothyroidism, unspecified: Secondary | ICD-10-CM | POA: Diagnosis present

## 2019-05-18 DIAGNOSIS — K219 Gastro-esophageal reflux disease without esophagitis: Secondary | ICD-10-CM | POA: Diagnosis present

## 2019-05-18 DIAGNOSIS — M1990 Unspecified osteoarthritis, unspecified site: Secondary | ICD-10-CM | POA: Diagnosis present

## 2019-05-18 LAB — COMPREHENSIVE METABOLIC PANEL
ALT: 14 U/L (ref 0–44)
AST: 21 U/L (ref 15–41)
Albumin: 3.1 g/dL — ABNORMAL LOW (ref 3.5–5.0)
Alkaline Phosphatase: 64 U/L (ref 38–126)
Anion gap: 13 (ref 5–15)
BUN: 14 mg/dL (ref 8–23)
CO2: 22 mmol/L (ref 22–32)
Calcium: 8.1 mg/dL — ABNORMAL LOW (ref 8.9–10.3)
Chloride: 106 mmol/L (ref 98–111)
Creatinine, Ser: 0.91 mg/dL (ref 0.44–1.00)
GFR calc Af Amer: >60 mL/min (ref >60)
GFR calc non Af Amer: 60 mL/min — ABNORMAL LOW (ref >60)
Glucose, Bld: 148 mg/dL — ABNORMAL HIGH (ref 70–99)
Potassium: 3.2 mmol/L — ABNORMAL LOW (ref 3.5–5.1)
Sodium: 141 mmol/L (ref 135–145)
Total Bilirubin: 0.5 mg/dL (ref 0.3–1.2)
Total Protein: 5.6 g/dL — ABNORMAL LOW (ref 6.5–8.1)

## 2019-05-18 LAB — CBC WITH DIFFERENTIAL/PLATELET
Abs Immature Granulocytes: 0.02 10*3/uL (ref 0.00–0.07)
Basophils Absolute: 0 10*3/uL (ref 0.0–0.1)
Basophils Relative: 0 %
Eosinophils Absolute: 0 10*3/uL (ref 0.0–0.5)
Eosinophils Relative: 0 %
HCT: 35.8 % — ABNORMAL LOW (ref 36.0–46.0)
Hemoglobin: 12 g/dL (ref 12.0–15.0)
Immature Granulocytes: 1 %
Lymphocytes Relative: 8 %
Lymphs Abs: 0.3 10*3/uL — ABNORMAL LOW (ref 0.7–4.0)
MCH: 27 pg (ref 26.0–34.0)
MCHC: 33.5 g/dL (ref 30.0–36.0)
MCV: 80.4 fL (ref 80.0–100.0)
Monocytes Absolute: 0.2 10*3/uL (ref 0.1–1.0)
Monocytes Relative: 5 %
Neutro Abs: 3 10*3/uL (ref 1.7–7.7)
Neutrophils Relative %: 86 %
Platelets: 222 10*3/uL (ref 150–400)
RBC: 4.45 MIL/uL (ref 3.87–5.11)
RDW: 14.2 % (ref 11.5–15.5)
WBC: 3.4 10*3/uL — ABNORMAL LOW (ref 4.0–10.5)
nRBC: 0 % (ref 0.0–0.2)

## 2019-05-18 LAB — D-DIMER, QUANTITATIVE: D-Dimer, Quant: 1.59 ug/mL-FEU — ABNORMAL HIGH (ref 0.00–0.50)

## 2019-05-18 LAB — C-REACTIVE PROTEIN: CRP: 6.8 mg/dL — ABNORMAL HIGH (ref <1.0)

## 2019-05-18 MED ORDER — BUPROPION HCL ER (XL) 150 MG PO TB24
150.0000 mg | ORAL_TABLET | Freq: Every day | ORAL | Status: DC
  Administered 2019-05-18 – 2019-05-21 (×4): 150 mg via ORAL
  Filled 2019-05-18 (×4): qty 1

## 2019-05-18 MED ORDER — CHLORHEXIDINE GLUCONATE CLOTH 2 % EX PADS
6.0000 | MEDICATED_PAD | Freq: Every day | CUTANEOUS | Status: DC
  Administered 2019-05-18 – 2019-05-21 (×3): 6 via TOPICAL

## 2019-05-18 MED ORDER — TRAZODONE HCL 50 MG PO TABS: 50.0000 mg | ORAL_TABLET | Freq: Every evening | ORAL | Status: DC | PRN

## 2019-05-18 MED ORDER — SODIUM CHLORIDE 0.9 % IV SOLN: INTRAVENOUS | Status: AC

## 2019-05-18 MED ORDER — AMLODIPINE BESYLATE 5 MG PO TABS
5.0000 mg | ORAL_TABLET | Freq: Every day | ORAL | Status: DC
  Administered 2019-05-18 – 2019-05-21 (×4): 5 mg via ORAL
  Filled 2019-05-18 (×5): qty 1

## 2019-05-18 MED ORDER — POTASSIUM CHLORIDE CRYS ER 20 MEQ PO TBCR
40.0000 meq | EXTENDED_RELEASE_TABLET | ORAL | Status: AC
  Administered 2019-05-18 (×2): 40 meq via ORAL
  Filled 2019-05-18 (×2): qty 2

## 2019-05-18 MED ORDER — PANTOPRAZOLE SODIUM 40 MG PO TBEC
40.0000 mg | DELAYED_RELEASE_TABLET | Freq: Every day | ORAL | Status: DC
  Administered 2019-05-18 – 2019-05-21 (×4): 40 mg via ORAL
  Filled 2019-05-18 (×4): qty 1

## 2019-05-18 MED ORDER — HYDRALAZINE HCL 20 MG/ML IJ SOLN: 5.0000 mg | Freq: Four times a day (QID) | INTRAMUSCULAR | Status: DC | PRN

## 2019-05-18 MED ORDER — ENSURE ENLIVE PO LIQD
237.0000 mL | Freq: Two times a day (BID) | ORAL | Status: DC
  Administered 2019-05-18 – 2019-05-21 (×3): 237 mL via ORAL

## 2019-05-18 MED ORDER — ASPIRIN 81 MG PO CHEW
81.0000 mg | CHEWABLE_TABLET | Freq: Every day | ORAL | Status: DC
  Administered 2019-05-18 – 2019-05-21 (×4): 81 mg via ORAL
  Filled 2019-05-18 (×4): qty 1

## 2019-05-18 NOTE — Progress Notes (Signed)
Patient arrived to 5W from the ED ~0100.  Pivot out of wheelchair to bed with 1 assist.  She is alert and oriented x4.  Very pleasant and conversational.  Currently on room air.  BP elevated 182/73, all other VSS.  On call, Baltazar Najjar, paged about BP. On assessment she denies pain and SOB.  She said that she "already feels so much better."  Patient was washed and skin found intact.  She has a port for regular treatments right side chest.  PIV access left AC, flushed and saline locked.  On continuous pulse-ox.  Patient preferred to use the [place] because she felt too weak to utilize the [facility].  Educated the patient on when/how to call for assistance.  Bed in lowest position.
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
## 2019-05-19 DIAGNOSIS — U071 COVID-19: Principal | ICD-10-CM

## 2019-05-19 DIAGNOSIS — E039 Hypothyroidism, unspecified: Secondary | ICD-10-CM

## 2019-05-19 DIAGNOSIS — I10 Essential (primary) hypertension: Secondary | ICD-10-CM

## 2019-05-19 LAB — CBC WITH DIFFERENTIAL/PLATELET
Abs Immature Granulocytes: 0.02 10*3/uL (ref 0.00–0.07)
Basophils Absolute: 0 10*3/uL (ref 0.0–0.1)
Basophils Relative: 0 %
Eosinophils Absolute: 0 10*3/uL (ref 0.0–0.5)
Eosinophils Relative: 0 %
HCT: 32.7 % — ABNORMAL LOW (ref 36.0–46.0)
Hemoglobin: 11 g/dL — ABNORMAL LOW (ref 12.0–15.0)
Immature Granulocytes: 0 %
Lymphocytes Relative: 10 %
Lymphs Abs: 0.5 10*3/uL — ABNORMAL LOW (ref 0.7–4.0)
MCH: 26.3 pg (ref 26.0–34.0)
MCHC: 33.6 g/dL (ref 30.0–36.0)
MCV: 78.2 fL — ABNORMAL LOW (ref 80.0–100.0)
Monocytes Absolute: 0.7 10*3/uL (ref 0.1–1.0)
Monocytes Relative: 13 %
Neutro Abs: 4 10*3/uL (ref 1.7–7.7)
Neutrophils Relative %: 77 %
Platelets: 210 10*3/uL (ref 150–400)
RBC: 4.18 MIL/uL (ref 3.87–5.11)
RDW: 14.4 % (ref 11.5–15.5)
WBC: 5.2 10*3/uL (ref 4.0–10.5)
nRBC: 0 % (ref 0.0–0.2)

## 2019-05-19 LAB — COMPREHENSIVE METABOLIC PANEL
ALT: 14 U/L (ref 0–44)
AST: 23 U/L (ref 15–41)
Albumin: 2.9 g/dL — ABNORMAL LOW (ref 3.5–5.0)
Alkaline Phosphatase: 58 U/L (ref 38–126)
Anion gap: 13 (ref 5–15)
BUN: 27 mg/dL — ABNORMAL HIGH (ref 8–23)
CO2: 19 mmol/L — ABNORMAL LOW (ref 22–32)
Calcium: 8.3 mg/dL — ABNORMAL LOW (ref 8.9–10.3)
Chloride: 108 mmol/L (ref 98–111)
Creatinine, Ser: 0.88 mg/dL (ref 0.44–1.00)
GFR calc Af Amer: >60 mL/min (ref >60)
GFR calc non Af Amer: >60 mL/min (ref >60)
Glucose, Bld: 106 mg/dL — ABNORMAL HIGH (ref 70–99)
Potassium: 4.4 mmol/L (ref 3.5–5.1)
Sodium: 140 mmol/L (ref 135–145)
Total Bilirubin: 0.3 mg/dL (ref 0.3–1.2)
Total Protein: 4.9 g/dL — ABNORMAL LOW (ref 6.5–8.1)

## 2019-05-19 LAB — MAGNESIUM: Magnesium: 1.5 mg/dL — ABNORMAL LOW (ref 1.7–2.4)

## 2019-05-19 LAB — D-DIMER, QUANTITATIVE: D-Dimer, Quant: 1.12 ug/mL-FEU — ABNORMAL HIGH (ref 0.00–0.50)

## 2019-05-19 LAB — C-REACTIVE PROTEIN: CRP: 4.1 mg/dL — ABNORMAL HIGH (ref <1.0)

## 2019-05-19 LAB — PHOSPHORUS: Phosphorus: 2.3 mg/dL — ABNORMAL LOW (ref 2.5–4.6)

## 2019-05-19 MED ORDER — MAGNESIUM SULFATE 2 GM/50ML IV SOLN
2.0000 g | Freq: Once | INTRAVENOUS | Status: AC
  Administered 2019-05-19: 2 g via INTRAVENOUS
  Filled 2019-05-19: qty 50

## 2019-05-19 MED ORDER — METOCLOPRAMIDE HCL 5 MG/ML IJ SOLN
5.0000 mg | Freq: Once | INTRAMUSCULAR | Status: AC
  Administered 2019-05-19: 5 mg via INTRAVENOUS
  Filled 2019-05-19: qty 2

## 2019-05-19 MED ORDER — POLYETHYLENE GLYCOL 3350 17 G PO PACK
17.0000 g | PACK | Freq: Two times a day (BID) | ORAL | Status: AC
  Administered 2019-05-19 (×2): 17 g via ORAL
  Filled 2019-05-19 (×2): qty 1
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
## 2019-05-20 ENCOUNTER — Encounter: Payer: Self-pay | Admitting: *Deleted

## 2019-05-20 LAB — COMPREHENSIVE METABOLIC PANEL
ALT: 14 U/L (ref 0–44)
AST: 22 U/L (ref 15–41)
Albumin: 2.7 g/dL — ABNORMAL LOW (ref 3.5–5.0)
Alkaline Phosphatase: 57 U/L (ref 38–126)
Anion gap: 10 (ref 5–15)
BUN: 21 mg/dL (ref 8–23)
CO2: 24 mmol/L (ref 22–32)
Calcium: 8.2 mg/dL — ABNORMAL LOW (ref 8.9–10.3)
Chloride: 105 mmol/L (ref 98–111)
Creatinine, Ser: 0.92 mg/dL (ref 0.44–1.00)
GFR calc Af Amer: >60 mL/min (ref >60)
GFR calc non Af Amer: 59 mL/min — ABNORMAL LOW (ref >60)
Glucose, Bld: 121 mg/dL — ABNORMAL HIGH (ref 70–99)
Potassium: 3.9 mmol/L (ref 3.5–5.1)
Sodium: 139 mmol/L (ref 135–145)
Total Bilirubin: 0.4 mg/dL (ref 0.3–1.2)
Total Protein: 5 g/dL — ABNORMAL LOW (ref 6.5–8.1)

## 2019-05-20 LAB — CBC WITH DIFFERENTIAL/PLATELET
Abs Immature Granulocytes: 0.03 10*3/uL (ref 0.00–0.07)
Basophils Absolute: 0 10*3/uL (ref 0.0–0.1)
Basophils Relative: 0 %
Eosinophils Absolute: 0 10*3/uL (ref 0.0–0.5)
Eosinophils Relative: 1 %
HCT: 35.9 % — ABNORMAL LOW (ref 36.0–46.0)
Hemoglobin: 11.8 g/dL — ABNORMAL LOW (ref 12.0–15.0)
Immature Granulocytes: 1 %
Lymphocytes Relative: 15 %
Lymphs Abs: 0.7 10*3/uL (ref 0.7–4.0)
MCH: 26.1 pg (ref 26.0–34.0)
MCHC: 32.9 g/dL (ref 30.0–36.0)
MCV: 79.4 fL — ABNORMAL LOW (ref 80.0–100.0)
Monocytes Absolute: 0.9 10*3/uL (ref 0.1–1.0)
Monocytes Relative: 18 %
Neutro Abs: 3.2 10*3/uL (ref 1.7–7.7)
Neutrophils Relative %: 65 %
Platelets: 235 10*3/uL (ref 150–400)
RBC: 4.52 MIL/uL (ref 3.87–5.11)
RDW: 14.4 % (ref 11.5–15.5)
WBC: 4.9 10*3/uL (ref 4.0–10.5)
nRBC: 0 % (ref 0.0–0.2)

## 2019-05-20 LAB — D-DIMER, QUANTITATIVE: D-Dimer, Quant: 1.01 ug/mL-FEU — ABNORMAL HIGH (ref 0.00–0.50)

## 2019-05-20 LAB — C-REACTIVE PROTEIN: CRP: 5.3 mg/dL — ABNORMAL HIGH (ref <1.0)

## 2019-05-20 LAB — MAGNESIUM: Magnesium: 1.9 mg/dL (ref 1.7–2.4)

## 2019-05-20 MED ORDER — BENZONATATE 100 MG PO CAPS
200.0000 mg | ORAL_CAPSULE | Freq: Three times a day (TID) | ORAL | Status: DC
  Administered 2019-05-20 (×3): 200 mg via ORAL
  Filled 2019-05-20 (×3): qty 2

## 2019-05-20 MED ORDER — MAGNESIUM SULFATE 2 GM/50ML IV SOLN
2.0000 g | Freq: Once | INTRAVENOUS | Status: AC
  Administered 2019-05-20: 2 g via INTRAVENOUS
  Filled 2019-05-20: qty 50
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
## 2019-05-21 LAB — COMPREHENSIVE METABOLIC PANEL
ALT: 14 U/L (ref 0–44)
AST: 18 U/L (ref 15–41)
Albumin: 2.7 g/dL — ABNORMAL LOW (ref 3.5–5.0)
Alkaline Phosphatase: 57 U/L (ref 38–126)
Anion gap: 12 (ref 5–15)
BUN: 16 mg/dL (ref 8–23)
CO2: 22 mmol/L (ref 22–32)
Calcium: 8.1 mg/dL — ABNORMAL LOW (ref 8.9–10.3)
Chloride: 105 mmol/L (ref 98–111)
Creatinine, Ser: 0.83 mg/dL (ref 0.44–1.00)
GFR calc Af Amer: >60 mL/min (ref >60)
GFR calc non Af Amer: >60 mL/min (ref >60)
Glucose, Bld: 105 mg/dL — ABNORMAL HIGH (ref 70–99)
Potassium: 3.9 mmol/L (ref 3.5–5.1)
Sodium: 139 mmol/L (ref 135–145)
Total Bilirubin: 0.5 mg/dL (ref 0.3–1.2)
Total Protein: 4.7 g/dL — ABNORMAL LOW (ref 6.5–8.1)

## 2019-05-21 LAB — CBC WITH DIFFERENTIAL/PLATELET
Abs Immature Granulocytes: 0 10*3/uL (ref 0.00–0.07)
Basophils Absolute: 0 10*3/uL (ref 0.0–0.1)
Basophils Relative: 0 %
Eosinophils Absolute: 0.1 10*3/uL (ref 0.0–0.5)
Eosinophils Relative: 1 %
HCT: 34 % — ABNORMAL LOW (ref 36.0–46.0)
Hemoglobin: 11.3 g/dL — ABNORMAL LOW (ref 12.0–15.0)
Lymphocytes Relative: 5 %
Lymphs Abs: 0.3 10*3/uL — ABNORMAL LOW (ref 0.7–4.0)
MCH: 26.3 pg (ref 26.0–34.0)
MCHC: 33.2 g/dL (ref 30.0–36.0)
MCV: 79.3 fL — ABNORMAL LOW (ref 80.0–100.0)
Monocytes Absolute: 0.6 10*3/uL (ref 0.1–1.0)
Monocytes Relative: 11 %
Neutro Abs: 4.7 10*3/uL (ref 1.7–7.7)
Neutrophils Relative %: 83 %
Platelets: 231 10*3/uL (ref 150–400)
RBC: 4.29 MIL/uL (ref 3.87–5.11)
RDW: 14.3 % (ref 11.5–15.5)
WBC: 5.7 10*3/uL (ref 4.0–10.5)
nRBC: 0 % (ref 0.0–0.2)
nRBC: 0 /100 WBC

## 2019-05-21 LAB — D-DIMER, QUANTITATIVE: D-Dimer, Quant: 0.7 ug/mL-FEU — ABNORMAL HIGH (ref 0.00–0.50)

## 2019-05-21 LAB — C-REACTIVE PROTEIN: CRP: 6 mg/dL — ABNORMAL HIGH (ref <1.0)

## 2019-05-21 MED ORDER — BENZONATATE 200 MG PO CAPS: 200.0000 mg | ORAL_CAPSULE | Freq: Three times a day (TID) | ORAL | 0 refills | Status: DC | PRN

## 2019-05-21 MED ORDER — ENSURE ENLIVE PO LIQD: 237.0000 mL | Freq: Two times a day (BID) | ORAL | 0 refills | Status: DC

## 2019-05-21 MED ORDER — HYDROCOD POLST-CPM POLST ER 10-8 MG/5ML PO SUER: 5.0000 mL | Freq: Two times a day (BID) | ORAL | 0 refills | Status: DC | PRN

## 2019-05-21 MED FILL — HYDROCODONE-CHLORPHEN ER SU: 10-8 | 10 days supply | Qty: 70 | Fill #0

## 2019-05-21 MED FILL — BENZONATATE 200 MG CAPS: 200 | 6 days supply | Qty: 20 | Fill #0
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
## 2019-05-21 NOTE — Discharge Instructions (Signed)
Person Under Monitoring Name: Amanda [name]  Location: [place] [address]   Infection Prevention Recommendations for Individuals Confirmed to have, or Being Evaluated for, 2019 Novel Coronavirus (COVID-19) Infection Who Receive Care at Home  Individuals who are confirmed to have, or are being evaluated for, COVID-19 should follow the prevention steps below until a healthcare provider or local or state health department says they can return to normal activities.  Stay home except to get medical care You should restrict activities outside your home, except for getting medical care. Do not go to work, school, or public areas, and do not use public transportation or taxis.  Call ahead before visiting your doctor Before your medical appointment, call the healthcare provider and tell them that you have, or are being evaluated for, COVID-19 infection. This will help the healthcare provider's office take steps to keep other people from getting infected. Ask your healthcare provider to call the local or state health department.  Monitor your symptoms Seek prompt medical attention if your illness is worsening (e.g., difficulty breathing). Before going to your medical appointment, call the healthcare provider and tell them that you have, or are being evaluated for, COVID-19 infection. Ask your healthcare provider to call the local or state health department.  Wear a facemask You should wear a facemask that covers your nose and mouth when you are in the same room with other people and when you visit a healthcare provider. People who live with or visit you should also wear a facemask while they are in the same room with you.  Separate yourself from other people in your home As much as possible, you should stay in a different room from other people in your home. Also, you should use a separate bathroom, if available.  Avoid sharing household items You should not  share dishes, drinking glasses, cups, eating utensils, towels, bedding, or other items with other people in your home. After using these items, you should wash them thoroughly with soap and water.  Cover your coughs and sneezes Cover your mouth and nose with a tissue when you cough or sneeze, or you can cough or sneeze into your sleeve. Throw used tissues in a lined trash can, and immediately wash your hands with soap and water for at least 20 seconds or use an alcohol-based hand rub.  Wash your [facility] your hands often and thoroughly with soap and water for at least 20 seconds. You can use an alcohol-based hand sanitizer if soap and water are not available and if your hands are not visibly dirty. Avoid touching your eyes, nose, and mouth with unwashed hands.   Prevention Steps for Caregivers and Household Members of Individuals Confirmed to have, or Being Evaluated for, COVID-19 Infection Being Cared for in the Home  If you live with, or provide care at home for, a person confirmed to have, or being evaluated for, COVID-19 infection please follow these guidelines to prevent infection:  Follow healthcare provider's instructions Make sure that you understand and can help the patient follow any healthcare provider instructions for all care.  Provide for the patient's basic needs You should help the patient with basic needs in the home and provide support for getting groceries, prescriptions, and other personal needs.  Monitor the patient's symptoms If they are getting sicker, call his or her medical provider and tell them that the patient has, or is being evaluated for, COVID-19 infection. This will help the healthcare provider's  office take steps to keep other people from getting infected. Ask the healthcare provider to call the local or state health department.  Limit the number of people who have contact with the patient  If possible, have only one caregiver for the  patient.  Other household members should stay in another home or place of residence. If this is not possible, they should stay  in another room, or be separated from the patient as much as possible. Use a separate bathroom, if available.  Restrict visitors who do not have an essential need to be in the home.  Keep older adults, very young children, and other sick people away from the patient Keep older adults, very young children, and those who have compromised immune systems or chronic health conditions away from the patient. This includes people with chronic heart, lung, or kidney conditions, diabetes, and cancer.  Ensure good ventilation Make sure that shared spaces in the home have good air flow, such as from an air conditioner or an opened window, weather permitting.  Wash your hands often  Wash your hands often and thoroughly with soap and water for at least 20 seconds. You can use an alcohol based hand sanitizer if soap and water are not available and if your hands are not visibly dirty.  Avoid touching your eyes, nose, and mouth with unwashed hands.  Use disposable paper towels to dry your hands. If not available, use dedicated cloth towels and replace them when they become wet.  Wear a facemask and gloves  Wear a disposable facemask at all times in the room and gloves when you touch or have contact with the patient's blood, body fluids, and/or secretions or excretions, such as sweat, saliva, sputum, nasal mucus, vomit, urine, or feces.  Ensure the mask fits over your nose and mouth tightly, and do not touch it during use.  Throw out disposable facemasks and gloves after using them. Do not reuse.  Wash your hands immediately after removing your facemask and gloves.  If your personal clothing becomes contaminated, carefully remove clothing and launder. Wash your hands after handling contaminated clothing.  Place all used disposable facemasks, gloves, and other waste in a lined  container before disposing them with other household waste.  Remove gloves and wash your hands immediately after handling these items.  Do not share dishes, glasses, or other household items with the patient  Avoid sharing household items. You should not share dishes, drinking glasses, cups, eating utensils, towels, bedding, or other items with a patient who is confirmed to have, or being evaluated for, COVID-19 infection.  After the person uses these items, you should wash them thoroughly with soap and water.  Wash laundry thoroughly  Immediately remove and wash clothes or bedding that have blood, body fluids, and/or secretions or excretions, such as sweat, saliva, sputum, nasal mucus, vomit, urine, or feces, on them.  Wear gloves when handling laundry from the patient.  Read and follow directions on labels of laundry or clothing items and detergent. In general, wash and dry with the warmest temperatures recommended on the label.  Clean all areas the individual has used often  Clean all touchable surfaces, such as counters, tabletops, doorknobs, bathroom fixtures, toilets, phones, keyboards, tablets, and bedside tables, every day. Also, clean any surfaces that may have blood, body fluids, and/or secretions or excretions on them.  Wear gloves when cleaning surfaces the patient has come in contact with.  Use a diluted bleach solution (e.g., dilute bleach with 1  part bleach and 10 parts water) or a household disinfectant with a label that says EPA-registered for coronaviruses. To make a bleach solution at home, add 1 tablespoon of bleach to 1 quart (4 cups) of water. For a larger supply, add  cup of bleach to 1 gallon (16 cups) of water.  Read labels of cleaning products and follow recommendations provided on product labels. Labels contain instructions for safe and effective use of the cleaning product including precautions you should take when applying the product, such as wearing gloves or  eye protection and making sure you have good ventilation during use of the product.  Remove gloves and wash hands immediately after cleaning.  Monitor yourself for signs and symptoms of illness Caregivers and household members are considered close contacts, should monitor their health, and will be asked to limit movement outside of the home to the extent possible. Follow the monitoring steps for close contacts listed on the symptom monitoring form.   ? If you have additional questions, contact your local health department or call the epidemiologist on call at [phone] (available 24/7). ? This guidance is subject to change. For the most up-to-date guidance from [facility], please refer to their website: [email or web address]

## 2019-05-21 NOTE — Plan of Care (Signed)
  Problem: Education: Goal: Knowledge of General Education information will improve Description: Including pain rating scale, medication(s)/side effects and non-pharmacologic comfort measures Outcome: Adequate for Discharge    Problem: Health Behavior/Discharge Planning: Goal: Ability to manage health-related needs will improve Outcome: Adequate for Discharge   Problem: Clinical Measurements: Goal: Ability to maintain clinical measurements within normal limits will improve Outcome: Adequate for Discharge   Problem: Clinical Measurements: Goal: Will remain free from infection Outcome: Adequate for Discharge   Problem: Clinical Measurements: Goal: Diagnostic test results will improve Outcome: Adequate for Discharge   Problem: Clinical Measurements: Goal: Respiratory complications will improve Outcome: Adequate for Discharge   Problem: Clinical Measurements: Goal: Cardiovascular complication will be avoided Outcome: Adequate for Discharge   Problem: Activity: Goal: Risk for activity intolerance will decrease Outcome: Adequate for Discharge   Problem: Nutrition: Goal: Adequate nutrition will be maintained Outcome: Adequate for Discharge   Problem: Coping: Goal: Level of anxiety will decrease Outcome: Adequate for Discharge   Problem: Elimination: Goal: Will not experience complications related to bowel motility Outcome: Adequate for Discharge   Problem: Elimination: Goal: Will not experience complications related to urinary retention Outcome: Adequate for Discharge   Problem: Safety: Goal: Ability to remain free from injury will improve Outcome: Adequate for Discharge   Problem: Skin Integrity: Goal: Risk for impaired skin integrity will decrease Outcome: Adequate for Discharge   Problem: Education: Goal: Knowledge of risk factors and measures for prevention of condition will improve Outcome: Adequate for Discharge   Problem: Respiratory: Goal: Will maintain  a patent airway Outcome: Adequate for Discharge   Problem: Respiratory: Goal: Complications related to the disease process, condition or treatment will be avoided or minimized Outcome: Adequate for Discharge

## 2019-05-21 NOTE — Progress Notes (Signed)
Feels a lot better this morning-she actually got up ambulated around the room and took a shower.  Still cough but better.  No longer with nausea vomiting.  Suspect stable to be discharged home later today.  See discharge summary for details.
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
## 2019-05-21 NOTE — TOC Transition Note (Addendum)
Transition of Care [facility]) - CM/SW Discharge Note   Patient Details  Name: Amanda [name] MRN: [id number] Date of Birth: [birth year]  [facility] CM/SW Contact:  Maryclare Labrador, RN Phone Number: 05/21/2019, 12:24 PM   Clinical Narrative:   Pt deemed stable for discharge home today.  Pts husband will also discharge home from hospital today.  Per bedside nurse [name] confirms that family will provide 24 hour supervision at discharge - daughter and pt aware that [facility] will only provide sessions once or twice a week for approximately 45 mins each.  CM informed [facility] that pt will discharge home today.  CM arranged post discharge televist appt for pt - see AVS - CM also informed pt of televist appt.  Daughter Lanelle Bal will provide transportation home at discharge.   NO other CM needs identified  - CM signing off    Final next level of care: [place] Barriers to Discharge: Barriers Resolved   Patient Goals and CMS Choice Patient states their goals for this hospitalization and ongoing recovery are:: Pt states she is ready to get back home with husband - both are currently hospitalized CMS Medicare.gov Compare Post Acute Care list provided to:: Patient Choice offered to / list presented to : Patient  Discharge Placement                       Discharge Plan and Services     Post Acute Care Choice: Home Health                    HH Arranged: PT, OT [facility] Agency: [facility] (now [facility]) Date [place]: 05/20/19 Time [place]: [place] Representative spoke with at [place]: [place] ([place]) Interventions     Readmission Risk Interventions Readmission Risk Prevention Plan 12/16/2018 07/01/2018 06/30/2018  Post Dischage Appt Not Complete - -  Appt Comments Pt discharging to CIR - -  Medication Screening Complete - -  Transportation Screening Complete - Complete   PCP or Specialist Appt within 3-5 Days - Complete -  HRI or [place] - - Complete  Social Work Consult for [place] Planning/Counseling - Complete -  Palliative Care Screening - - Not Applicable  Medication Review Press photographer) - - Complete  Some recent data might be hidden
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
## 2019-05-21 NOTE — Progress Notes (Signed)
NURSING PROGRESS NOTE  TERY HORIGAN [id number] Discharge Data: 05/21/2019 2:05 PM Attending Provider: No att. providers found [id number] Koren Shiver, DO     [name] to be D/C'd Home per MD order.  Discussed with the patient the After Visit Summary and all questions fully answered. All IV's discontinued with no bleeding noted. All belongings returned to patient for patient to take home. Pt taken downstairs via wheelchair accompanied by a staff member.  Last Vital Signs:  Blood pressure (!) 156/60, pulse 75, temperature 97.9 F (36.6 C), temperature source Oral, resp. rate 19, height 5\' 1"  (1.549 m), weight 62.6 kg, SpO2 91 %.  Discharge Medication List Allergies as of 05/21/2019      Reactions   Phenergan [promethazine Hcl] Other (See Comments)   Jerking/agitation   Preservision Areds 2 [multiple Vitamins-minerals] Nausea Only   Levofloxacin Nausea And Vomiting   Pravastatin Nausea And Vomiting      Medication List    STOP taking these medications   acetaminophen-codeine 300-30 MG tablet Commonly known as: TYLENOL #3   guaiFENesin 100 MG/5ML liquid Commonly known as: ROBITUSSIN   magic mouthwash w/lidocaine Soln   ondansetron 8 MG disintegrating tablet Commonly known as: Zofran ODT   traMADol 50 MG tablet Commonly known as: ULTRAM     TAKE these medications   acetaminophen 650 MG CR tablet Commonly known as: TYLENOL Take 650-1,300 mg by mouth every 6 (six) hours as needed for pain. What changed: Another medication with the same name was removed. Continue taking this medication, and follow the directions you see here.   amLODipine 5 MG tablet Commonly known as: NORVASC Take 1 tablet (5 mg total) by mouth daily with breakfast.   aspirin 81 MG chewable tablet Chew 1 tablet (81 mg total) by mouth daily.   benzonatate 200 MG capsule Commonly known as: TESSALON Take 1 capsule (200 mg total) by mouth 3 (three) times daily as needed for cough.   buPROPion 150 MG  24 hr tablet Commonly known as: WELLBUTRIN XL Take 1 tablet (150 mg total) by mouth daily.   chlorpheniramine-HYDROcodone 10-8 MG/5ML Suer Commonly known as: TUSSIONEX Take 5 mLs by mouth every 12 (twelve) hours as needed for cough.   EYE VITAMINS PO Take 1 tablet by mouth daily.   famotidine 20 MG tablet Commonly known as: Pepcid One at bedtime What changed:   how much to take  how to take this  when to take this  reasons to take this  additional instructions   feeding supplement (ENSURE ENLIVE) Liqd Take 237 mLs by mouth 2 (two) times daily between meals.   levothyroxine 125 MCG tablet Commonly known as: SYNTHROID Take 1 tablet (125 mcg total) by mouth daily before breakfast.   omeprazole 20 MG capsule Commonly known as: PRILOSEC Take 20 mg by mouth daily.   ondansetron 4 MG tablet Commonly known as: Zofran Take 1 tablet (4 mg total) by mouth every 8 (eight) hours as needed for nausea or vomiting.   traZODone 50 MG tablet Commonly known as: DESYREL Take 1 tablet (50 mg total) by mouth at bedtime as needed for sleep.

## 2019-05-24 ENCOUNTER — Ambulatory Visit (INDEPENDENT_AMBULATORY_CARE_PROVIDER_SITE_OTHER): Payer: Medicare Other | Admitting: Family Medicine

## 2019-05-24 ENCOUNTER — Telehealth: Payer: Self-pay | Admitting: *Deleted

## 2019-05-24 ENCOUNTER — Encounter: Payer: Self-pay | Admitting: Family Medicine

## 2019-05-24 ENCOUNTER — Other Ambulatory Visit: Payer: Self-pay

## 2019-05-24 VITALS — Ht 61.0 in

## 2019-05-24 DIAGNOSIS — U071 COVID-19: Secondary | ICD-10-CM | POA: Diagnosis not present

## 2019-05-24 DIAGNOSIS — J1282 Pneumonia due to coronavirus disease 2019: Secondary | ICD-10-CM | POA: Diagnosis not present

## 2019-05-24 NOTE — Assessment & Plan Note (Signed)
Pt d/c with doxy and zofran App with d/c clinic set up for 3/24 Call with any problems concerns Fu here after 21 days

## 2019-05-24 NOTE — Telephone Encounter (Signed)
unble to reach pt. 1st attempt. Pt has hospital follow up w/ PCP later this afternoon.
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
## 2019-05-25 DIAGNOSIS — U071 COVID-19: Secondary | ICD-10-CM | POA: Diagnosis not present

## 2019-05-25 DIAGNOSIS — J1282 Pneumonia due to coronavirus disease 2019: Secondary | ICD-10-CM | POA: Diagnosis not present

## 2019-05-25 DIAGNOSIS — I129 Hypertensive chronic kidney disease with stage 1 through stage 4 chronic kidney disease, or unspecified chronic kidney disease: Secondary | ICD-10-CM | POA: Diagnosis not present

## 2019-05-25 DIAGNOSIS — E785 Hyperlipidemia, unspecified: Secondary | ICD-10-CM | POA: Diagnosis not present

## 2019-05-25 DIAGNOSIS — M8580 Other specified disorders of bone density and structure, unspecified site: Secondary | ICD-10-CM | POA: Diagnosis not present

## 2019-05-25 DIAGNOSIS — R32 Unspecified urinary incontinence: Secondary | ICD-10-CM | POA: Diagnosis not present

## 2019-05-25 DIAGNOSIS — D479 Neoplasm of uncertain behavior of lymphoid, hematopoietic and related tissue, unspecified: Secondary | ICD-10-CM | POA: Diagnosis not present

## 2019-05-25 DIAGNOSIS — K5909 Other constipation: Secondary | ICD-10-CM | POA: Diagnosis not present

## 2019-05-25 DIAGNOSIS — M542 Cervicalgia: Secondary | ICD-10-CM | POA: Diagnosis not present

## 2019-05-25 DIAGNOSIS — C8590 Non-Hodgkin lymphoma, unspecified, unspecified site: Secondary | ICD-10-CM | POA: Diagnosis not present

## 2019-05-25 DIAGNOSIS — M5440 Lumbago with sciatica, unspecified side: Secondary | ICD-10-CM | POA: Diagnosis not present

## 2019-05-25 DIAGNOSIS — K219 Gastro-esophageal reflux disease without esophagitis: Secondary | ICD-10-CM | POA: Diagnosis not present

## 2019-05-25 DIAGNOSIS — M1990 Unspecified osteoarthritis, unspecified site: Secondary | ICD-10-CM | POA: Diagnosis not present

## 2019-05-25 DIAGNOSIS — D696 Thrombocytopenia, unspecified: Secondary | ICD-10-CM | POA: Diagnosis not present

## 2019-05-25 DIAGNOSIS — K589 Irritable bowel syndrome without diarrhea: Secondary | ICD-10-CM | POA: Diagnosis not present

## 2019-05-25 DIAGNOSIS — H3530 Unspecified macular degeneration: Secondary | ICD-10-CM | POA: Diagnosis not present

## 2019-05-25 DIAGNOSIS — D631 Anemia in chronic kidney disease: Secondary | ICD-10-CM | POA: Diagnosis not present

## 2019-05-25 DIAGNOSIS — R350 Frequency of micturition: Secondary | ICD-10-CM | POA: Diagnosis not present

## 2019-05-25 DIAGNOSIS — Z85828 Personal history of other malignant neoplasm of skin: Secondary | ICD-10-CM | POA: Diagnosis not present

## 2019-05-25 DIAGNOSIS — E039 Hypothyroidism, unspecified: Secondary | ICD-10-CM | POA: Diagnosis not present

## 2019-05-25 DIAGNOSIS — N183 Chronic kidney disease, stage 3 unspecified: Secondary | ICD-10-CM | POA: Diagnosis not present

## 2019-05-26 ENCOUNTER — Other Ambulatory Visit: Payer: Self-pay

## 2019-05-26 ENCOUNTER — Ambulatory Visit (INDEPENDENT_AMBULATORY_CARE_PROVIDER_SITE_OTHER): Payer: Medicare Other | Admitting: Nurse Practitioner

## 2019-05-26 ENCOUNTER — Telehealth: Payer: Self-pay | Admitting: Family Medicine

## 2019-05-26 VITALS — BP 130/72 | HR 70 | Temp 96.2°F | Ht 61.0 in | Wt 133.0 lb

## 2019-05-26 DIAGNOSIS — J1282 Pneumonia due to coronavirus disease 2019: Secondary | ICD-10-CM

## 2019-05-26 DIAGNOSIS — U071 COVID-19: Secondary | ICD-10-CM

## 2019-05-26 NOTE — Telephone Encounter (Signed)
Verbal given 

## 2019-05-26 NOTE — Telephone Encounter (Signed)
Requesting a Verbal Order   Caller/AgencyKennedy [name] Number: [phone]   Requesting PT (phyiscal therapy)   Frequency:  1 time  FOR 2 weeks  2 times FOR 4 weeks  1 times  FOR 3 weeks

## 2019-05-26 NOTE — Patient Instructions (Signed)
Will order follow up labs today and call with results  Will need follow up chest xray in 3 weeks  Stay active - PT will be working with patient  Nutritional rehabilitation hand out given  Continue current medications  Follow up: Follow up in 3 weeks for repeat chest x ray

## 2019-05-26 NOTE — Assessment & Plan Note (Signed)
Assessment: Patient is much improved since hospital discharge. Lungs sounds clear on exam today.   Plan: Will order follow up labs today and call with results  Will need follow up chest xray in 3 weeks  Stay active - PT will be working with patient  Nutritional rehabilitation hand out given  Continue current medications  Follow up: Follow up in 3 weeks for repeat chest x ray
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
## 2019-05-27 ENCOUNTER — Encounter: Payer: Self-pay | Admitting: *Deleted

## 2019-05-27 DIAGNOSIS — D631 Anemia in chronic kidney disease: Secondary | ICD-10-CM | POA: Diagnosis not present

## 2019-05-27 DIAGNOSIS — J1282 Pneumonia due to coronavirus disease 2019: Secondary | ICD-10-CM | POA: Diagnosis not present

## 2019-05-27 DIAGNOSIS — N183 Chronic kidney disease, stage 3 unspecified: Secondary | ICD-10-CM | POA: Diagnosis not present

## 2019-05-27 DIAGNOSIS — E039 Hypothyroidism, unspecified: Secondary | ICD-10-CM | POA: Diagnosis not present

## 2019-05-27 DIAGNOSIS — U071 COVID-19: Secondary | ICD-10-CM | POA: Diagnosis not present

## 2019-05-27 DIAGNOSIS — I129 Hypertensive chronic kidney disease with stage 1 through stage 4 chronic kidney disease, or unspecified chronic kidney disease: Secondary | ICD-10-CM | POA: Diagnosis not present

## 2019-05-27 LAB — CBC
Hematocrit: 38 % (ref 34.0–46.6)
Hemoglobin: 12.2 g/dL (ref 11.1–15.9)
MCH: 26.9 pg (ref 26.6–33.0)
MCHC: 32.1 g/dL (ref 31.5–35.7)
MCV: 84 fL (ref 79–97)
Platelets: 369 10*3/uL (ref 150–450)
RBC: 4.54 x10E6/uL (ref 3.77–5.28)
RDW: 14.8 % (ref 11.7–15.4)
WBC: 7.8 10*3/uL (ref 3.4–10.8)

## 2019-05-27 LAB — COMPREHENSIVE METABOLIC PANEL
ALT: 10 IU/L (ref 0–32)
AST: 19 IU/L (ref 0–40)
Albumin/Globulin Ratio: 1.9 (ref 1.2–2.2)
Albumin: 3.7 g/dL (ref 3.7–4.7)
Alkaline Phosphatase: 94 IU/L (ref 39–117)
BUN/Creatinine Ratio: 18 (ref 12–28)
BUN: 17 mg/dL (ref 8–27)
Bilirubin Total: 0.3 mg/dL (ref 0.0–1.2)
CO2: 24 mmol/L (ref 20–29)
Calcium: 9.3 mg/dL (ref 8.7–10.3)
Chloride: 106 mmol/L (ref 96–106)
Creatinine, Ser: 0.97 mg/dL (ref 0.57–1.00)
GFR calc Af Amer: 64 mL/min/{1.73_m2} (ref >59)
GFR calc non Af Amer: 55 mL/min/{1.73_m2} — ABNORMAL LOW (ref >59)
Globulin, Total: 1.9 g/dL (ref 1.5–4.5)
Glucose: 98 mg/dL (ref 65–99)
Potassium: 5.3 mmol/L — ABNORMAL HIGH (ref 3.5–5.2)
Sodium: 146 mmol/L — ABNORMAL HIGH (ref 134–144)
Total Protein: 5.6 g/dL — ABNORMAL LOW (ref 6.0–8.5)

## 2019-05-28 NOTE — Progress Notes (Signed)
Patient notified of results, patient stated that while in the hospital her potassium medication was actually increased.  Verbally understood. No additional questions.

## 2019-06-01 ENCOUNTER — Other Ambulatory Visit: Payer: Self-pay | Admitting: *Deleted

## 2019-06-01 ENCOUNTER — Encounter: Payer: Self-pay | Admitting: *Deleted

## 2019-06-01 ENCOUNTER — Encounter: Payer: Self-pay | Admitting: Family Medicine

## 2019-06-01 NOTE — Progress Notes (Signed)
Pharmacist Chemotherapy Monitoring - Follow Up Assessment    I verify that I have reviewed each item in the below checklist:  . Regimen for the patient is scheduled for the appropriate day and plan matches scheduled date. [name] Appropriate non-routine labs are ordered dependent on drug ordered. . If applicable, additional medications reviewed and ordered per protocol based on lifetime cumulative doses and/or treatment regimen.   Plan for follow-up and/or issues identified: No . I-vent associated with next due treatment: No . MD and/or nursing notified: No  Dagon Budai, [name] 06/01/2019 1:39 PM

## 2019-06-01 NOTE — Patient Outreach (Signed)
Telephone outreach for [facility] on [facility] discharge call: Have questions.  Spoke with Mrs. Amanda [name] this morning and she reports her oncologist answered her question which was, "How long should I be in quarantine before I may restart my maintenance chemotherapy infusion?"  She was recently hospitalized for pneumonia and COVID 19 (3/15-3/19/21). She is recovering well and is receiving PT at home. She will see her oncologist this week.  She denies any care management needs at this time.  I will send her a letter with our information for her future reference.  Amanda [name]. Amanda Neither, MSN, [facility] Gerontological Nurse Practitioner [facility] [phone]

## 2019-06-02 DIAGNOSIS — I129 Hypertensive chronic kidney disease with stage 1 through stage 4 chronic kidney disease, or unspecified chronic kidney disease: Secondary | ICD-10-CM | POA: Diagnosis not present

## 2019-06-02 DIAGNOSIS — E039 Hypothyroidism, unspecified: Secondary | ICD-10-CM | POA: Diagnosis not present

## 2019-06-02 DIAGNOSIS — U071 COVID-19: Secondary | ICD-10-CM | POA: Diagnosis not present

## 2019-06-02 DIAGNOSIS — N183 Chronic kidney disease, stage 3 unspecified: Secondary | ICD-10-CM | POA: Diagnosis not present

## 2019-06-02 DIAGNOSIS — J1282 Pneumonia due to coronavirus disease 2019: Secondary | ICD-10-CM | POA: Diagnosis not present

## 2019-06-02 DIAGNOSIS — D631 Anemia in chronic kidney disease: Secondary | ICD-10-CM | POA: Diagnosis not present

## 2019-06-03 DIAGNOSIS — M545 Low back pain: Secondary | ICD-10-CM | POA: Diagnosis not present

## 2019-06-03 DIAGNOSIS — I10 Essential (primary) hypertension: Secondary | ICD-10-CM | POA: Diagnosis not present

## 2019-06-07 DIAGNOSIS — I129 Hypertensive chronic kidney disease with stage 1 through stage 4 chronic kidney disease, or unspecified chronic kidney disease: Secondary | ICD-10-CM | POA: Diagnosis not present

## 2019-06-07 DIAGNOSIS — E039 Hypothyroidism, unspecified: Secondary | ICD-10-CM | POA: Diagnosis not present

## 2019-06-07 DIAGNOSIS — D631 Anemia in chronic kidney disease: Secondary | ICD-10-CM | POA: Diagnosis not present

## 2019-06-07 DIAGNOSIS — N183 Chronic kidney disease, stage 3 unspecified: Secondary | ICD-10-CM | POA: Diagnosis not present

## 2019-06-07 DIAGNOSIS — U071 COVID-19: Secondary | ICD-10-CM | POA: Diagnosis not present

## 2019-06-07 DIAGNOSIS — J1282 Pneumonia due to coronavirus disease 2019: Secondary | ICD-10-CM | POA: Diagnosis not present

## 2019-06-08 ENCOUNTER — Inpatient Hospital Stay: Payer: Medicare Other | Attending: Hematology

## 2019-06-08 ENCOUNTER — Other Ambulatory Visit: Payer: Self-pay

## 2019-06-08 ENCOUNTER — Inpatient Hospital Stay: Payer: Medicare Other

## 2019-06-08 ENCOUNTER — Encounter: Payer: Self-pay | Admitting: *Deleted

## 2019-06-08 ENCOUNTER — Telehealth: Payer: Self-pay | Admitting: Hematology

## 2019-06-08 ENCOUNTER — Encounter: Payer: Self-pay | Admitting: Hematology

## 2019-06-08 ENCOUNTER — Inpatient Hospital Stay (HOSPITAL_BASED_OUTPATIENT_CLINIC_OR_DEPARTMENT_OTHER): Payer: Medicare Other | Admitting: Hematology

## 2019-06-08 ENCOUNTER — Ambulatory Visit (HOSPITAL_BASED_OUTPATIENT_CLINIC_OR_DEPARTMENT_OTHER)
Admission: RE | Admit: 2019-06-08 | Discharge: 2019-06-08 | Disposition: A | Discharge status: Home / Self Care | Payer: Medicare Other | Source: Ambulatory Visit | Attending: Hematology | Admitting: Hematology

## 2019-06-08 VITALS — BP 178/70 | HR 61 | Temp 97.1°F | Resp 18 | Ht 61.0 in | Wt 134.0 lb

## 2019-06-08 VITALS — BP 138/66 | HR 70 | Temp 96.9°F | Resp 17

## 2019-06-08 DIAGNOSIS — Z888 Allergy status to other drugs, medicaments and biological substances status: Secondary | ICD-10-CM | POA: Diagnosis not present

## 2019-06-08 DIAGNOSIS — Z5112 Encounter for antineoplastic immunotherapy: Secondary | ICD-10-CM | POA: Diagnosis not present

## 2019-06-08 DIAGNOSIS — R918 Other nonspecific abnormal finding of lung field: Secondary | ICD-10-CM | POA: Insufficient documentation

## 2019-06-08 DIAGNOSIS — C8304 Small cell B-cell lymphoma, lymph nodes of axilla and upper limb: Secondary | ICD-10-CM | POA: Insufficient documentation

## 2019-06-08 DIAGNOSIS — R519 Headache, unspecified: Secondary | ICD-10-CM | POA: Insufficient documentation

## 2019-06-08 DIAGNOSIS — Z79899 Other long term (current) drug therapy: Secondary | ICD-10-CM | POA: Diagnosis not present

## 2019-06-08 DIAGNOSIS — Z20822 Contact with and (suspected) exposure to covid-19: Secondary | ICD-10-CM | POA: Insufficient documentation

## 2019-06-08 DIAGNOSIS — M545 Low back pain: Secondary | ICD-10-CM | POA: Insufficient documentation

## 2019-06-08 DIAGNOSIS — J984 Other disorders of lung: Secondary | ICD-10-CM | POA: Insufficient documentation

## 2019-06-08 DIAGNOSIS — R05 Cough: Secondary | ICD-10-CM | POA: Insufficient documentation

## 2019-06-08 DIAGNOSIS — C8580 Other specified types of non-Hodgkin lymphoma, unspecified site: Secondary | ICD-10-CM | POA: Diagnosis not present

## 2019-06-08 DIAGNOSIS — R55 Syncope and collapse: Secondary | ICD-10-CM | POA: Diagnosis not present

## 2019-06-08 DIAGNOSIS — I700 Atherosclerosis of aorta: Secondary | ICD-10-CM | POA: Diagnosis not present

## 2019-06-08 DIAGNOSIS — Z881 Allergy status to other antibiotic agents status: Secondary | ICD-10-CM | POA: Diagnosis not present

## 2019-06-08 DIAGNOSIS — D649 Anemia, unspecified: Secondary | ICD-10-CM | POA: Insufficient documentation

## 2019-06-08 DIAGNOSIS — R9082 White matter disease, unspecified: Secondary | ICD-10-CM | POA: Insufficient documentation

## 2019-06-08 DIAGNOSIS — C884 Extranodal marginal zone B-cell lymphoma of mucosa-associated lymphoid tissue [MALT-lymphoma]: Secondary | ICD-10-CM | POA: Diagnosis not present

## 2019-06-08 DIAGNOSIS — R0989 Other specified symptoms and signs involving the circulatory and respiratory systems: Secondary | ICD-10-CM | POA: Diagnosis not present

## 2019-06-08 LAB — CBC WITH DIFFERENTIAL (CANCER CENTER ONLY)
Abs Immature Granulocytes: 0.01 10*3/uL (ref 0.00–0.07)
Basophils Absolute: 0 10*3/uL (ref 0.0–0.1)
Basophils Relative: 1 %
Eosinophils Absolute: 0.3 10*3/uL (ref 0.0–0.5)
Eosinophils Relative: 5 %
HCT: 33.7 % — ABNORMAL LOW (ref 36.0–46.0)
Hemoglobin: 11 g/dL — ABNORMAL LOW (ref 12.0–15.0)
Immature Granulocytes: 0 %
Lymphocytes Relative: 19 %
Lymphs Abs: 1 10*3/uL (ref 0.7–4.0)
MCH: 26.9 pg (ref 26.0–34.0)
MCHC: 32.6 g/dL (ref 30.0–36.0)
MCV: 82.4 fL (ref 80.0–100.0)
Monocytes Absolute: 0.8 10*3/uL (ref 0.1–1.0)
Monocytes Relative: 15 %
Neutro Abs: 3.1 10*3/uL (ref 1.7–7.7)
Neutrophils Relative %: 60 %
Platelet Count: 275 10*3/uL (ref 150–400)
RBC: 4.09 MIL/uL (ref 3.87–5.11)
RDW: 15.9 % — ABNORMAL HIGH (ref 11.5–15.5)
WBC Count: 5.2 10*3/uL (ref 4.0–10.5)
nRBC: 0 % (ref 0.0–0.2)

## 2019-06-08 LAB — CMP (CANCER CENTER ONLY)
ALT: 11 U/L (ref 0–44)
AST: 16 U/L (ref 15–41)
Albumin: 4.1 g/dL (ref 3.5–5.0)
Alkaline Phosphatase: 67 U/L (ref 38–126)
Anion gap: 8 (ref 5–15)
BUN: 17 mg/dL (ref 8–23)
CO2: 28 mmol/L (ref 22–32)
Calcium: 9.3 mg/dL (ref 8.9–10.3)
Chloride: 103 mmol/L (ref 98–111)
Creatinine: 0.86 mg/dL (ref 0.44–1.00)
GFR, Est AFR Am: >60 mL/min (ref >60)
GFR, Estimated: >60 mL/min (ref >60)
Glucose, Bld: 99 mg/dL (ref 70–99)
Potassium: 3.9 mmol/L (ref 3.5–5.1)
Sodium: 139 mmol/L (ref 135–145)
Total Bilirubin: 0.3 mg/dL (ref 0.3–1.2)
Total Protein: 5.9 g/dL — ABNORMAL LOW (ref 6.5–8.1)

## 2019-06-08 LAB — LACTATE DEHYDROGENASE: LDH: 101 U/L (ref 98–192)

## 2019-06-08 MED ORDER — SODIUM CHLORIDE 0.9 % IV SOLN
Freq: Once | INTRAVENOUS | Status: AC
  Filled 2019-06-08: qty 250

## 2019-06-08 MED ORDER — ACETAMINOPHEN 325 MG PO TABS
ORAL_TABLET | ORAL | Status: AC
  Filled 2019-06-08: qty 2

## 2019-06-08 MED ORDER — IOHEXOL 300 MG/ML  SOLN
100.0000 mL | Freq: Once | INTRAMUSCULAR | Status: AC | PRN
  Administered 2019-06-08: 09:00:00 100 mL via INTRAVENOUS

## 2019-06-08 MED ORDER — ACETAMINOPHEN 325 MG PO TABS
650.0000 mg | ORAL_TABLET | Freq: Once | ORAL | Status: AC
  Administered 2019-06-08: 650 mg via ORAL

## 2019-06-08 MED ORDER — DIPHENHYDRAMINE HCL 25 MG PO CAPS
ORAL_CAPSULE | ORAL | Status: AC
  Filled 2019-06-08: qty 2

## 2019-06-08 MED ORDER — SODIUM CHLORIDE 0.9 % IV SOLN
375.0000 mg/m2 | Freq: Once | INTRAVENOUS | Status: AC
  Administered 2019-06-08: 600 mg via INTRAVENOUS
  Filled 2019-06-08: qty 10

## 2019-06-08 MED ORDER — DIPHENHYDRAMINE HCL 25 MG PO CAPS
50.0000 mg | ORAL_CAPSULE | Freq: Once | ORAL | Status: AC
  Administered 2019-06-08: 50 mg via ORAL

## 2019-06-08 MED ORDER — HEPARIN SOD (PORK) LOCK FLUSH 100 UNIT/ML IV SOLN
500.0000 [IU] | Freq: Once | INTRAVENOUS | Status: AC | PRN
  Administered 2019-06-08: 500 [IU]
  Filled 2019-06-08: qty 5

## 2019-06-08 MED ORDER — SODIUM CHLORIDE 0.9% FLUSH
10.0000 mL | INTRAVENOUS | Status: DC | PRN
  Administered 2019-06-08: 10 mL
  Filled 2019-06-08: qty 10
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
## 2019-06-08 NOTE — Patient Instructions (Signed)
Rituximab injection What is this medicine? RITUXIMAB (ri TUX i mab) is a monoclonal antibody. It is used to treat certain types of cancer like non-Hodgkin lymphoma and chronic lymphocytic leukemia. It is also used to treat rheumatoid arthritis, granulomatosis with polyangiitis (or Wegener's granulomatosis), microscopic polyangiitis, and pemphigus vulgaris. This medicine may be used for other purposes; ask your health care provider or pharmacist if you have questions. COMMON BRAND NAME(S): Rituxan, RUXIENCE What should I tell my health care provider before I take this medicine? They need to know if you have any of these conditions:  heart disease  infection (especially a virus infection such as hepatitis B, chickenpox, cold sores, or herpes)  immune system problems  irregular heartbeat  kidney disease  low blood counts, like low white cell, platelet, or red cell counts  lung or breathing disease, like asthma  recently received or scheduled to receive a vaccine  an unusual or allergic reaction to rituximab, other medicines, foods, dyes, or preservatives  pregnant or trying to get pregnant  breast-feeding How should I use this medicine? This medicine is for infusion into a vein. It is administered in a hospital or clinic by a specially trained health care professional. A special MedGuide will be given to you by the pharmacist with each prescription and refill. Be sure to read this information carefully each time. Talk to your pediatrician regarding the use of this medicine in children. This medicine is not approved for use in children. Overdosage: If you think you have taken too much of this medicine contact a poison control center or emergency room at once. NOTE: This medicine is only for you. Do not share this medicine with others. What if I miss a dose? It is important not to miss a dose. Call your doctor or health care professional if you are unable to keep an appointment. What  may interact with this medicine?  cisplatin  live virus vaccines This list may not describe all possible interactions. Give your health care provider a list of all the medicines, herbs, non-prescription drugs, or dietary supplements you use. Also tell them if you smoke, drink alcohol, or use illegal drugs. Some items may interact with your medicine. What should I watch for while using this medicine? Your condition will be monitored carefully while you are receiving this medicine. You may need blood work done while you are taking this medicine. This medicine can cause serious allergic reactions. To reduce your risk you may need to take medicine before treatment with this medicine. Take your medicine as directed. In some patients, this medicine may cause a serious brain infection that may cause death. If you have any problems seeing, thinking, speaking, walking, or standing, tell your healthcare professional right away. If you cannot reach your healthcare professional, urgently seek other source of medical care. Call your doctor or health care professional for advice if you get a fever, chills or sore throat, or other symptoms of a cold or flu. Do not treat yourself. This drug decreases your body's ability to fight infections. Try to avoid being around people who are sick. Do not become pregnant while taking this medicine or for at least 12 months after stopping it. Women should inform their doctor if they wish to become pregnant or think they might be pregnant. There is a potential for serious side effects to an unborn child. Talk to your health care professional or pharmacist for more information. Do not breast-feed an infant while taking this medicine or for at   least 6 months after stopping it. What side effects may I notice from receiving this medicine? Side effects that you should report to your doctor or health care professional as soon as possible:  allergic reactions like skin rash, itching or  hives; swelling of the face, lips, or tongue  breathing problems  chest pain  changes in vision  diarrhea  headache with fever, neck stiffness, sensitivity to light, nausea, or confusion  fast, irregular heartbeat  loss of memory  low blood counts - this medicine may decrease the number of white blood cells, red blood cells and platelets. You may be at increased risk for infections and bleeding.  mouth sores  problems with balance, talking, or walking  redness, blistering, peeling or loosening of the skin, including inside the mouth  signs of infection - fever or chills, cough, sore throat, pain or difficulty passing urine  signs and symptoms of kidney injury like trouble passing urine or change in the amount of urine  signs and symptoms of liver injury like dark yellow or brown urine; general ill feeling or flu-like symptoms; light-colored stools; loss of appetite; nausea; right upper belly pain; unusually weak or tired; yellowing of the eyes or skin  signs and symptoms of low blood pressure like dizziness; feeling faint or lightheaded, falls; unusually weak or tired  stomach pain  swelling of the ankles, feet, hands  unusual bleeding or bruising  vomiting Side effects that usually do not require medical attention (report to your doctor or health care professional if they continue or are bothersome):  headache  joint pain  muscle cramps or muscle pain  nausea  tiredness This list may not describe all possible side effects. Call your doctor for medical advice about side effects. You may report side effects to FDA at 1-800-FDA-1088. Where should I keep my medicine? This drug is given in a hospital or clinic and will not be stored at home. NOTE: This sheet is a summary. It may not cover all possible information. If you have questions about this medicine, talk to your doctor, pharmacist, or health care provider.  2020 Elsevier/Gold Standard (2018-04-01  22:01:36)  

## 2019-06-08 NOTE — Telephone Encounter (Signed)
Per 4/6 No change

## 2019-06-09 DIAGNOSIS — J1282 Pneumonia due to coronavirus disease 2019: Secondary | ICD-10-CM | POA: Diagnosis not present

## 2019-06-09 DIAGNOSIS — E039 Hypothyroidism, unspecified: Secondary | ICD-10-CM | POA: Diagnosis not present

## 2019-06-09 DIAGNOSIS — I129 Hypertensive chronic kidney disease with stage 1 through stage 4 chronic kidney disease, or unspecified chronic kidney disease: Secondary | ICD-10-CM | POA: Diagnosis not present

## 2019-06-09 DIAGNOSIS — U071 COVID-19: Secondary | ICD-10-CM | POA: Diagnosis not present

## 2019-06-09 DIAGNOSIS — D631 Anemia in chronic kidney disease: Secondary | ICD-10-CM | POA: Diagnosis not present

## 2019-06-09 DIAGNOSIS — N183 Chronic kidney disease, stage 3 unspecified: Secondary | ICD-10-CM | POA: Diagnosis not present

## 2019-06-14 DIAGNOSIS — I129 Hypertensive chronic kidney disease with stage 1 through stage 4 chronic kidney disease, or unspecified chronic kidney disease: Secondary | ICD-10-CM | POA: Diagnosis not present

## 2019-06-14 DIAGNOSIS — U071 COVID-19: Secondary | ICD-10-CM | POA: Diagnosis not present

## 2019-06-14 DIAGNOSIS — D631 Anemia in chronic kidney disease: Secondary | ICD-10-CM | POA: Diagnosis not present

## 2019-06-14 DIAGNOSIS — J1282 Pneumonia due to coronavirus disease 2019: Secondary | ICD-10-CM | POA: Diagnosis not present

## 2019-06-14 DIAGNOSIS — N183 Chronic kidney disease, stage 3 unspecified: Secondary | ICD-10-CM | POA: Diagnosis not present

## 2019-06-14 DIAGNOSIS — E039 Hypothyroidism, unspecified: Secondary | ICD-10-CM | POA: Diagnosis not present

## 2019-06-16 ENCOUNTER — Ambulatory Visit (INDEPENDENT_AMBULATORY_CARE_PROVIDER_SITE_OTHER): Payer: Medicare Other | Admitting: Nurse Practitioner

## 2019-06-16 ENCOUNTER — Other Ambulatory Visit: Payer: Self-pay

## 2019-06-16 VITALS — BP 124/72 | HR 72 | Temp 97.7°F | Ht 61.0 in | Wt 134.0 lb

## 2019-06-16 DIAGNOSIS — J1282 Pneumonia due to coronavirus disease 2019: Secondary | ICD-10-CM | POA: Diagnosis not present

## 2019-06-16 DIAGNOSIS — I129 Hypertensive chronic kidney disease with stage 1 through stage 4 chronic kidney disease, or unspecified chronic kidney disease: Secondary | ICD-10-CM | POA: Diagnosis not present

## 2019-06-16 DIAGNOSIS — D631 Anemia in chronic kidney disease: Secondary | ICD-10-CM | POA: Diagnosis not present

## 2019-06-16 DIAGNOSIS — Z8616 Personal history of COVID-19: Secondary | ICD-10-CM

## 2019-06-16 DIAGNOSIS — R05 Cough: Secondary | ICD-10-CM

## 2019-06-16 DIAGNOSIS — U071 COVID-19: Secondary | ICD-10-CM | POA: Diagnosis not present

## 2019-06-16 DIAGNOSIS — N183 Chronic kidney disease, stage 3 unspecified: Secondary | ICD-10-CM | POA: Diagnosis not present

## 2019-06-16 DIAGNOSIS — R5383 Other fatigue: Secondary | ICD-10-CM

## 2019-06-16 DIAGNOSIS — R059 Cough, unspecified: Secondary | ICD-10-CM | POA: Insufficient documentation

## 2019-06-16 DIAGNOSIS — E039 Hypothyroidism, unspecified: Secondary | ICD-10-CM | POA: Diagnosis not present

## 2019-06-16 NOTE — Patient Instructions (Addendum)
Glad to see that you are improving!   Recent CT scan still showed subtle ground glass appearance - clinically improved -  may follow up with chest x ray in 3 months with PCP  Stay active - Continue PT   Nutritional rehabilitation hand out given  Continue current medications  Follow up as needed

## 2019-06-16 NOTE — Assessment & Plan Note (Signed)
Cough Fatigue:  Assessment: patient is much improved. States that cough is almost cleared. Lung sounds clear on exam today.   Plan: Recent CT scan still showed subtle ground glass appearance - clinically improved -  may follow up with chest x ray in 3 months with PCP  Stay active - Continue PT   Nutritional rehabilitation hand out given  Continue current medications  Follow up as needed
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
## 2019-06-19 ENCOUNTER — Encounter: Payer: Self-pay | Admitting: Family Medicine

## 2019-06-21 DIAGNOSIS — E039 Hypothyroidism, unspecified: Secondary | ICD-10-CM | POA: Diagnosis not present

## 2019-06-21 DIAGNOSIS — N183 Chronic kidney disease, stage 3 unspecified: Secondary | ICD-10-CM | POA: Diagnosis not present

## 2019-06-21 DIAGNOSIS — J1282 Pneumonia due to coronavirus disease 2019: Secondary | ICD-10-CM | POA: Diagnosis not present

## 2019-06-21 DIAGNOSIS — I129 Hypertensive chronic kidney disease with stage 1 through stage 4 chronic kidney disease, or unspecified chronic kidney disease: Secondary | ICD-10-CM | POA: Diagnosis not present

## 2019-06-21 DIAGNOSIS — U071 COVID-19: Secondary | ICD-10-CM | POA: Diagnosis not present

## 2019-06-21 DIAGNOSIS — D631 Anemia in chronic kidney disease: Secondary | ICD-10-CM | POA: Diagnosis not present

## 2019-06-21 MED ORDER — OMEPRAZOLE 20 MG PO CPDR: 20.0000 mg | DELAYED_RELEASE_CAPSULE | Freq: Every day | ORAL | 1 refills | Status: DC

## 2019-06-22 DIAGNOSIS — H40013 Open angle with borderline findings, low risk, bilateral: Secondary | ICD-10-CM | POA: Diagnosis not present

## 2019-06-23 DIAGNOSIS — E039 Hypothyroidism, unspecified: Secondary | ICD-10-CM | POA: Diagnosis not present

## 2019-06-23 DIAGNOSIS — N183 Chronic kidney disease, stage 3 unspecified: Secondary | ICD-10-CM | POA: Diagnosis not present

## 2019-06-23 DIAGNOSIS — I129 Hypertensive chronic kidney disease with stage 1 through stage 4 chronic kidney disease, or unspecified chronic kidney disease: Secondary | ICD-10-CM | POA: Diagnosis not present

## 2019-06-23 DIAGNOSIS — J1282 Pneumonia due to coronavirus disease 2019: Secondary | ICD-10-CM | POA: Diagnosis not present

## 2019-06-23 DIAGNOSIS — D631 Anemia in chronic kidney disease: Secondary | ICD-10-CM | POA: Diagnosis not present

## 2019-06-23 DIAGNOSIS — U071 COVID-19: Secondary | ICD-10-CM | POA: Diagnosis not present

## 2019-06-24 DIAGNOSIS — U071 COVID-19: Secondary | ICD-10-CM | POA: Diagnosis not present

## 2019-07-04 IMAGING — CR CHEST - 2 VIEW
2 series · 2 of 2 positions shown · non-contrast
Comparison: June 30, 2018

CLINICAL DATA: Shortness of breath and cough

EXAM:
CHEST - 2 VIEW

[chest pa]
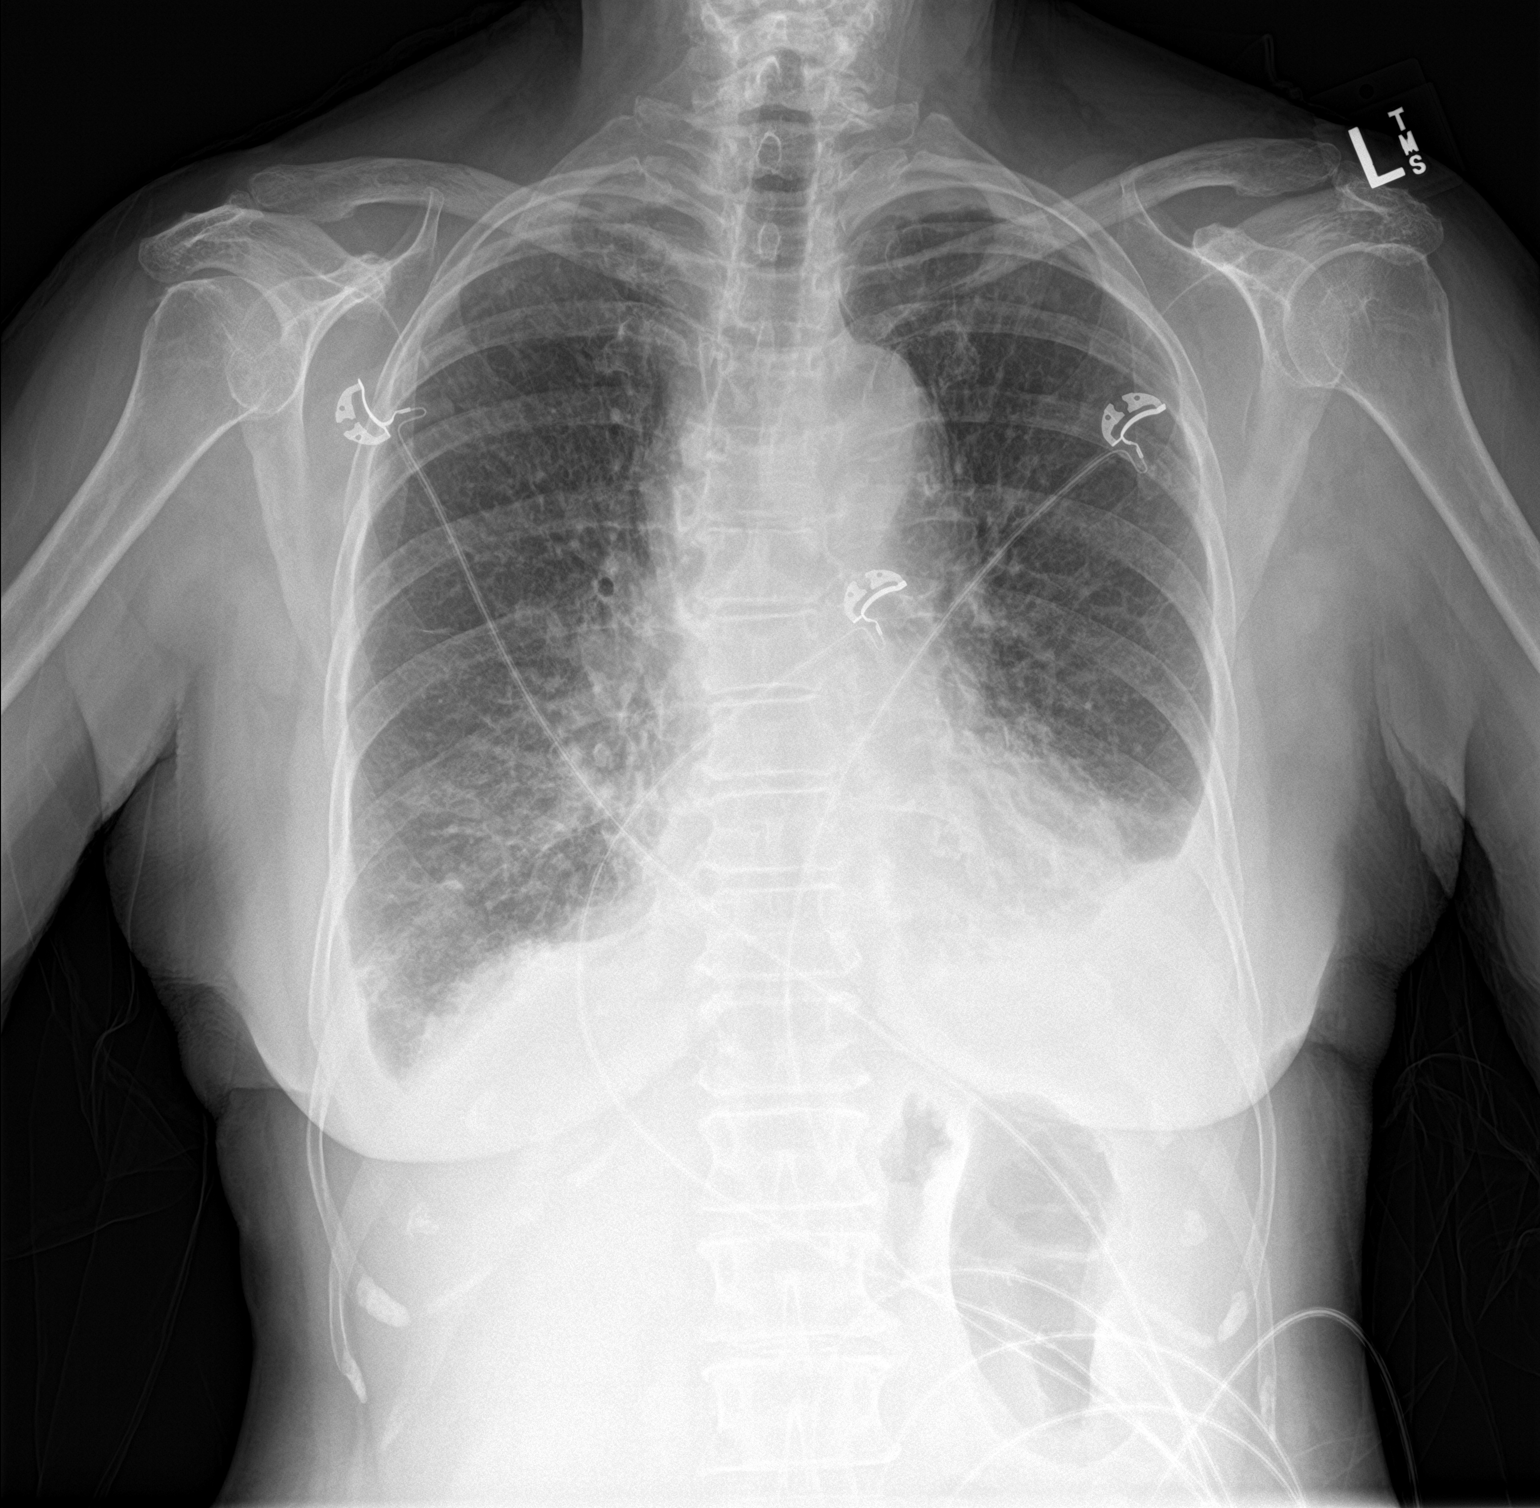

[chest lat]
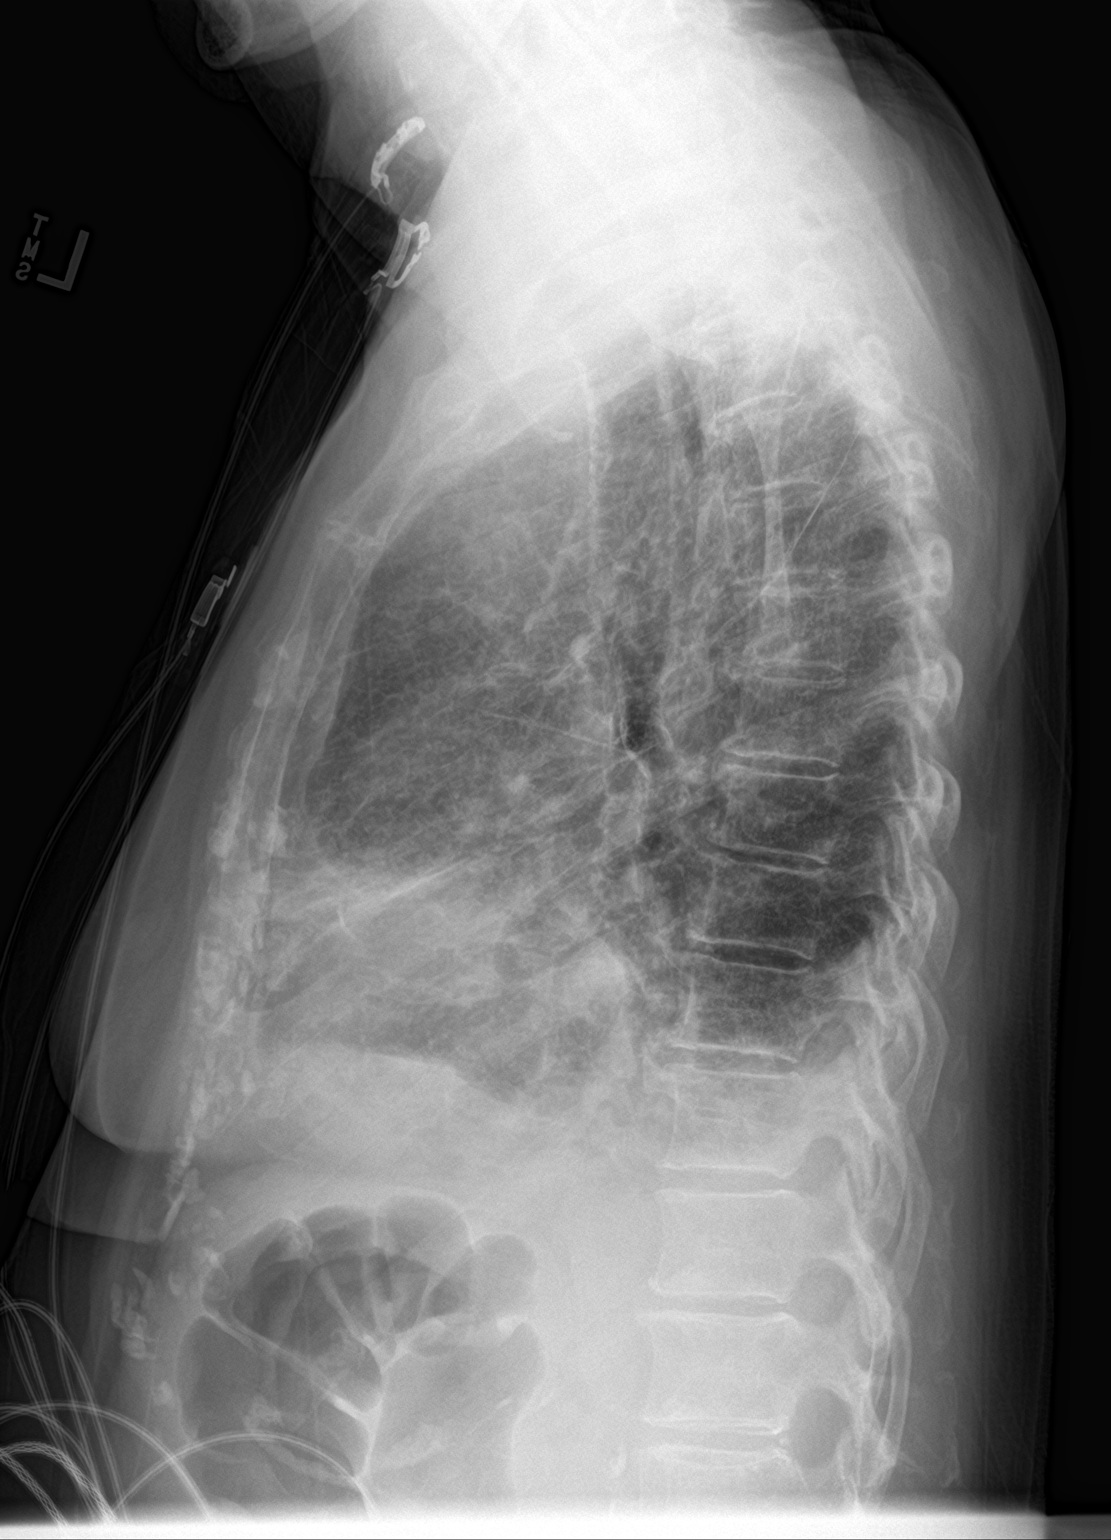

[2 of 2 positions shown; findings below may reference images not displayed]

FINDINGS: There are pleural effusions bilaterally, larger on the left than on
the right, stable. There is atelectatic change in the bases with
patchy airspace disease in the bases as well, stable. No new opacity
evident. Heart is upper normal in size with pulmonary vascularity
normal. No adenopathy. No bone lesions.
IMPRESSION: Bilateral pleural effusions, larger on the left than on the right,
with bibasilar atelectasis and patchy airspace opacity, essentially
stable. Stable heart upper normal in size. No adenopathy evident.

## 2019-07-06 DIAGNOSIS — L820 Inflamed seborrheic keratosis: Secondary | ICD-10-CM | POA: Diagnosis not present

## 2019-07-06 DIAGNOSIS — C44519 Basal cell carcinoma of skin of other part of trunk: Secondary | ICD-10-CM | POA: Diagnosis not present

## 2019-07-10 ENCOUNTER — Encounter: Payer: Self-pay | Admitting: Family Medicine

## 2019-07-12 DIAGNOSIS — M545 Low back pain: Secondary | ICD-10-CM | POA: Diagnosis not present

## 2019-07-12 DIAGNOSIS — S32011A Stable burst fracture of first lumbar vertebra, initial encounter for closed fracture: Secondary | ICD-10-CM | POA: Diagnosis not present

## 2019-07-12 DIAGNOSIS — M4722 Other spondylosis with radiculopathy, cervical region: Secondary | ICD-10-CM | POA: Diagnosis not present

## 2019-07-12 MED ORDER — LEVOTHYROXINE SODIUM 125 MCG PO TABS: 125.0000 ug | ORAL_TABLET | Freq: Every day | ORAL | 0 refills | Status: DC

## 2019-08-09 ENCOUNTER — Other Ambulatory Visit: Payer: Self-pay

## 2019-08-09 ENCOUNTER — Ambulatory Visit: Payer: Medicare Other | Attending: Neurosurgery | Admitting: Physical Therapy

## 2019-08-09 ENCOUNTER — Encounter: Payer: Self-pay | Admitting: Physical Therapy

## 2019-08-09 DIAGNOSIS — R52 Pain, unspecified: Secondary | ICD-10-CM | POA: Insufficient documentation

## 2019-08-09 DIAGNOSIS — M545 Low back pain, unspecified: Secondary | ICD-10-CM

## 2019-08-09 DIAGNOSIS — M6281 Muscle weakness (generalized): Secondary | ICD-10-CM | POA: Insufficient documentation

## 2019-08-09 DIAGNOSIS — R252 Cramp and spasm: Secondary | ICD-10-CM | POA: Insufficient documentation

## 2019-08-09 DIAGNOSIS — M542 Cervicalgia: Secondary | ICD-10-CM | POA: Diagnosis not present
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
## 2019-08-09 NOTE — Patient Instructions (Signed)
Access Code: [email or web address] URL: [email or web address] Date: 08/09/2019 Prepared by: Amador Cunas  Exercises Supine Lower Trunk Rotation - 1 x daily - 7 x weekly - 10 reps - 3 sets Supine Bridge - 1 x daily - 7 x weekly - 10 reps - 3 sets Supine Double Knee to Chest - 1 x daily - 7 x weekly - 10 reps - 3 sets Gentle Levator Scapulae Stretch - 1 x daily - 7 x weekly - 10 reps - 3 sets Seated Upper Trapezius Stretch - 1 x daily - 7 x weekly - 10 reps - 3 sets Seated Cervical Rotation AROM - 1 x daily - 7 x weekly - 10 reps - 3 sets Seated Cervical Extension AROM - 1 x daily - 7 x weekly - 10 reps - 3 sets

## 2019-08-18 ENCOUNTER — Ambulatory Visit: Payer: Medicare Other | Admitting: Physical Therapy

## 2019-08-18 ENCOUNTER — Encounter: Payer: Self-pay | Admitting: Physical Therapy

## 2019-08-18 ENCOUNTER — Other Ambulatory Visit: Payer: Self-pay

## 2019-08-18 DIAGNOSIS — M542 Cervicalgia: Secondary | ICD-10-CM | POA: Diagnosis not present

## 2019-08-18 DIAGNOSIS — M6281 Muscle weakness (generalized): Secondary | ICD-10-CM

## 2019-08-18 DIAGNOSIS — R52 Pain, unspecified: Secondary | ICD-10-CM

## 2019-08-18 DIAGNOSIS — R252 Cramp and spasm: Secondary | ICD-10-CM
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
## 2019-08-20 ENCOUNTER — Ambulatory Visit: Payer: Medicare Other | Admitting: Physical Therapy

## 2019-08-20 ENCOUNTER — Encounter: Payer: Self-pay | Admitting: Physical Therapy

## 2019-08-20 ENCOUNTER — Other Ambulatory Visit: Payer: Self-pay

## 2019-08-20 DIAGNOSIS — R52 Pain, unspecified: Secondary | ICD-10-CM

## 2019-08-20 DIAGNOSIS — M542 Cervicalgia: Secondary | ICD-10-CM | POA: Diagnosis not present

## 2019-08-20 DIAGNOSIS — R252 Cramp and spasm: Secondary | ICD-10-CM

## 2019-08-20 DIAGNOSIS — M545 Low back pain, unspecified: Secondary | ICD-10-CM

## 2019-08-20 DIAGNOSIS — M6281 Muscle weakness (generalized): Secondary | ICD-10-CM
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
## 2019-08-25 ENCOUNTER — Other Ambulatory Visit: Payer: Self-pay

## 2019-08-25 ENCOUNTER — Ambulatory Visit: Payer: Medicare Other | Admitting: Physical Therapy

## 2019-08-25 ENCOUNTER — Encounter: Payer: Self-pay | Admitting: Physical Therapy

## 2019-08-25 DIAGNOSIS — M6281 Muscle weakness (generalized): Secondary | ICD-10-CM

## 2019-08-25 DIAGNOSIS — M542 Cervicalgia: Secondary | ICD-10-CM | POA: Diagnosis not present

## 2019-08-25 DIAGNOSIS — R252 Cramp and spasm: Secondary | ICD-10-CM

## 2019-08-25 DIAGNOSIS — R52 Pain, unspecified: Secondary | ICD-10-CM

## 2019-08-25 DIAGNOSIS — M545 Low back pain, unspecified: Secondary | ICD-10-CM
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
## 2019-08-27 ENCOUNTER — Ambulatory Visit: Payer: Medicare Other | Admitting: Physical Therapy

## 2019-08-27 ENCOUNTER — Encounter: Payer: Self-pay | Admitting: Physical Therapy

## 2019-08-27 ENCOUNTER — Other Ambulatory Visit: Payer: Self-pay

## 2019-08-27 DIAGNOSIS — R252 Cramp and spasm: Secondary | ICD-10-CM

## 2019-08-27 DIAGNOSIS — R52 Pain, unspecified: Secondary | ICD-10-CM

## 2019-08-27 DIAGNOSIS — M542 Cervicalgia: Secondary | ICD-10-CM | POA: Diagnosis not present

## 2019-08-27 DIAGNOSIS — M6281 Muscle weakness (generalized): Secondary | ICD-10-CM

## 2019-08-27 DIAGNOSIS — M545 Low back pain, unspecified: Secondary | ICD-10-CM
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
## 2019-08-30 ENCOUNTER — Encounter: Payer: Medicare Other | Admitting: Physical Therapy

## 2019-08-30 ENCOUNTER — Other Ambulatory Visit: Payer: Self-pay

## 2019-08-30 DIAGNOSIS — C8580 Other specified types of non-Hodgkin lymphoma, unspecified site: Secondary | ICD-10-CM

## 2019-08-31 ENCOUNTER — Telehealth: Payer: Self-pay | Admitting: Hematology & Oncology

## 2019-08-31 ENCOUNTER — Encounter: Payer: Self-pay | Admitting: *Deleted

## 2019-08-31 ENCOUNTER — Inpatient Hospital Stay: Payer: Medicare Other

## 2019-08-31 ENCOUNTER — Other Ambulatory Visit: Payer: Self-pay

## 2019-08-31 ENCOUNTER — Inpatient Hospital Stay (HOSPITAL_BASED_OUTPATIENT_CLINIC_OR_DEPARTMENT_OTHER): Payer: Medicare Other | Admitting: Hematology & Oncology

## 2019-08-31 ENCOUNTER — Inpatient Hospital Stay: Payer: Medicare Other | Attending: Hematology

## 2019-08-31 ENCOUNTER — Encounter: Payer: Self-pay | Admitting: Hematology & Oncology

## 2019-08-31 VITALS — BP 145/68 | HR 62 | Temp 97.1°F | Resp 18 | Wt 140.0 lb

## 2019-08-31 VITALS — BP 166/70 | HR 66

## 2019-08-31 DIAGNOSIS — Z881 Allergy status to other antibiotic agents status: Secondary | ICD-10-CM | POA: Insufficient documentation

## 2019-08-31 DIAGNOSIS — C8580 Other specified types of non-Hodgkin lymphoma, unspecified site: Secondary | ICD-10-CM

## 2019-08-31 DIAGNOSIS — Z5112 Encounter for antineoplastic immunotherapy: Secondary | ICD-10-CM | POA: Insufficient documentation

## 2019-08-31 DIAGNOSIS — Z888 Allergy status to other drugs, medicaments and biological substances status: Secondary | ICD-10-CM | POA: Insufficient documentation

## 2019-08-31 DIAGNOSIS — Z79899 Other long term (current) drug therapy: Secondary | ICD-10-CM | POA: Diagnosis not present

## 2019-08-31 DIAGNOSIS — C8304 Small cell B-cell lymphoma, lymph nodes of axilla and upper limb: Secondary | ICD-10-CM | POA: Diagnosis not present

## 2019-08-31 DIAGNOSIS — Z95828 Presence of other vascular implants and grafts: Secondary | ICD-10-CM

## 2019-08-31 LAB — CMP (CANCER CENTER ONLY)
ALT: 9 U/L (ref 0–44)
AST: 13 U/L — ABNORMAL LOW (ref 15–41)
Albumin: 4.2 g/dL (ref 3.5–5.0)
Alkaline Phosphatase: 81 U/L (ref 38–126)
Anion gap: 6 (ref 5–15)
BUN: 24 mg/dL — ABNORMAL HIGH (ref 8–23)
CO2: 30 mmol/L (ref 22–32)
Calcium: 9.6 mg/dL (ref 8.9–10.3)
Chloride: 103 mmol/L (ref 98–111)
Creatinine: 0.97 mg/dL (ref 0.44–1.00)
GFR, Est AFR Am: >60 mL/min (ref >60)
GFR, Estimated: 55 mL/min — ABNORMAL LOW (ref >60)
Glucose, Bld: 82 mg/dL (ref 70–99)
Potassium: 4.5 mmol/L (ref 3.5–5.1)
Sodium: 139 mmol/L (ref 135–145)
Total Bilirubin: 0.3 mg/dL (ref 0.3–1.2)
Total Protein: 6.1 g/dL — ABNORMAL LOW (ref 6.5–8.1)

## 2019-08-31 LAB — CBC WITH DIFFERENTIAL (CANCER CENTER ONLY)
Abs Immature Granulocytes: 0 10*3/uL (ref 0.00–0.07)
Basophils Absolute: 0.1 10*3/uL (ref 0.0–0.1)
Basophils Relative: 1 %
Eosinophils Absolute: 0.3 10*3/uL (ref 0.0–0.5)
Eosinophils Relative: 5 %
HCT: 37.3 % (ref 36.0–46.0)
Hemoglobin: 12.3 g/dL (ref 12.0–15.0)
Immature Granulocytes: 0 %
Lymphocytes Relative: 26 %
Lymphs Abs: 1.4 10*3/uL (ref 0.7–4.0)
MCH: 28 pg (ref 26.0–34.0)
MCHC: 33 g/dL (ref 30.0–36.0)
MCV: 85 fL (ref 80.0–100.0)
Monocytes Absolute: 0.8 10*3/uL (ref 0.1–1.0)
Monocytes Relative: 15 %
Neutro Abs: 2.8 10*3/uL (ref 1.7–7.7)
Neutrophils Relative %: 53 %
Platelet Count: 254 10*3/uL (ref 150–400)
RBC: 4.39 MIL/uL (ref 3.87–5.11)
RDW: 13.3 % (ref 11.5–15.5)
WBC Count: 5.3 10*3/uL (ref 4.0–10.5)
nRBC: 0 % (ref 0.0–0.2)

## 2019-08-31 LAB — LACTATE DEHYDROGENASE: LDH: 107 U/L (ref 98–192)

## 2019-08-31 MED ORDER — DIPHENHYDRAMINE HCL 25 MG PO CAPS
50.0000 mg | ORAL_CAPSULE | Freq: Once | ORAL | Status: AC
  Administered 2019-08-31: 50 mg via ORAL

## 2019-08-31 MED ORDER — SODIUM CHLORIDE 0.9% FLUSH
10.0000 mL | INTRAVENOUS | Status: DC | PRN
  Administered 2019-08-31: 10 mL
  Filled 2019-08-31: qty 10

## 2019-08-31 MED ORDER — DIPHENHYDRAMINE HCL 25 MG PO CAPS
ORAL_CAPSULE | ORAL | Status: AC
  Filled 2019-08-31: qty 2

## 2019-08-31 MED ORDER — HEPARIN SOD (PORK) LOCK FLUSH 100 UNIT/ML IV SOLN
500.0000 [IU] | Freq: Once | INTRAVENOUS | Status: AC | PRN
  Administered 2019-08-31: 500 [IU]
  Filled 2019-08-31: qty 5

## 2019-08-31 MED ORDER — SODIUM CHLORIDE 0.9 % IV SOLN
375.0000 mg/m2 | Freq: Once | INTRAVENOUS | Status: AC
  Administered 2019-08-31: 600 mg via INTRAVENOUS
  Filled 2019-08-31: qty 50

## 2019-08-31 MED ORDER — ACETAMINOPHEN 325 MG PO TABS: 650.0000 mg | ORAL_TABLET | Freq: Once | ORAL | Status: DC

## 2019-08-31 MED ORDER — SODIUM CHLORIDE 0.9 % IV SOLN
Freq: Once | INTRAVENOUS | Status: AC
  Filled 2019-08-31: qty 250

## 2019-08-31 MED ORDER — ACETAMINOPHEN 325 MG PO TABS
ORAL_TABLET | ORAL | Status: AC
  Filled 2019-08-31: qty 2

## 2019-08-31 MED ORDER — SODIUM CHLORIDE 0.9% FLUSH
10.0000 mL | Freq: Once | INTRAVENOUS | Status: AC
  Administered 2019-08-31: 10 mL via INTRAVENOUS
  Filled 2019-08-31: qty 10

## 2019-08-31 NOTE — Progress Notes (Signed)
Hematology and Oncology Follow Up Visit  Amanda [name] [id number] Feb 18, 1941 79 y.o. 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  Amanda [name] is a very charming 79 year old white female.  Amanda [name] was followed by Dr. Maylon Peppers.  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  Amanda [name] was treated aggressively with chemotherapy with R-CHOP.  Amanda [name] has 6 cycles of treatment.  Amanda [name] entered a complete response with this.  Amanda [name] then was placed on maintenance Rituxan.  Amanda [name] is doing okay.  Amanda [name] really has had no specific complaints.  Amanda [name] has had no problems with the coronavirus.  There is been no problems with fever.  Amanda [name] has had no nausea or vomiting.  There is been no change in bowel or bladder habits.  Amanda [name] has had no rashes.  There is been no leg swelling.  Amanda [name] has had no issues with headache.  There is been a good appetite.  Overall, Amanda [name] performance status is ECOG 1.  Medications:  Current Outpatient Medications:  .  acetaminophen (TYLENOL) 650 MG CR tablet, Take 650-1,300 mg by mouth every 6 (six) hours as needed for pain., Disp: , Rfl:  .  amLODipine (NORVASC) 5 MG tablet, Take 1 tablet (5 mg total) by mouth daily with breakfast., Disp: 90 tablet, Rfl: 1 .  aspirin 81 MG chewable tablet, Chew 1 tablet (81 mg total) by mouth daily., Disp:  , Rfl:  .  benzonatate (TESSALON) 200 MG capsule, Take 1 capsule (200 mg total) by mouth 3 (three) times daily as needed for cough. (Patient not taking: Reported on 06/16/2019), Disp: 20 capsule, Rfl: 0 .  buPROPion (WELLBUTRIN XL) 150 MG 24 hr tablet, Take 1 tablet (150 mg total) by mouth daily., Disp: 30 tablet, Rfl: 2 .  chlorpheniramine-HYDROcodone (TUSSIONEX) 10-8 MG/5ML SUER, Take 5 mLs by mouth  every 12 (twelve) hours as needed for cough., Disp: 70 mL, Rfl: 0 .  famotidine (PEPCID) 20 MG tablet, One at bedtime (Patient taking differently: Take 20 mg by mouth daily as needed for heartburn or indigestion. ), Disp: 30 tablet, Rfl: 11 .  gabapentin (NEURONTIN) 300 MG capsule, Take 300 mg by mouth., Disp: , Rfl:  .  levothyroxine (SYNTHROID) 125 MCG tablet, Take 1 tablet (125 mcg total) by mouth daily before breakfast., Disp: 90 tablet, Rfl: 0 .  Multiple Vitamins-Minerals (EYE VITAMINS PO), Take 1 tablet by mouth daily., Disp: , Rfl:  .  omeprazole (PRILOSEC) 20 MG capsule, Take 1 capsule (20 mg total) by mouth daily., Disp: 90 capsule, Rfl: 1 .  ondansetron (ZOFRAN) 4 MG tablet, Take 1 tablet (4 mg total) by mouth every 8 (eight) hours as needed for nausea or vomiting., Disp: 20 tablet, Rfl: 0 .  traZODone (DESYREL) 50 MG tablet, Take 1 tablet (50 mg total) by mouth at bedtime as needed for sleep., Disp: 30 tablet, Rfl: 1 No current facility-administered medications for this visit.  Facility-Administered Medications Ordered in Other Visits:  .  acetaminophen (TYLENOL) tablet 650 mg, 650 mg, Oral, Once, Tish Men, MD .  diphenhydrAMINE (BENADRYL) capsule 50 mg, 50 mg, Oral, Once, Tish Men, MD .  heparin lock flush 100 unit/mL, 500 Units, Intracatheter, Once PRN,  Tish Men, MD .  sodium chloride flush (NS) 0.9 % injection 10 mL, 10 mL, Intracatheter, PRN, Tish Men, MD .  sodium chloride flush (NS) 0.9 % injection 10 mL, 10 mL, Intracatheter, PRN, Tish Men, MD, 10 mL at 08/31/19 1313  Allergies:  Allergies  Allergen Reactions  . Phenergan [Promethazine Hcl] Other (See Comments)    Jerking/agitation  . Preservision Areds 2 [Multiple Vitamins-Minerals] Nausea Only  . Levofloxacin Nausea And Vomiting  . Pravastatin Nausea And Vomiting    Past Medical History, Surgical history, Social history, and Family History were reviewed and updated.  Review of Systems: Review of Systems   Constitutional: Negative.   HENT:  Negative.   Eyes: Negative.   Respiratory: Negative.   Cardiovascular: Negative.   Gastrointestinal: Negative.   Endocrine: Negative.   Genitourinary: Negative.    Musculoskeletal: Negative.   Skin: Negative.   Neurological: Negative.   Hematological: Negative.   Psychiatric/Behavioral: Negative.     Physical Exam:  weight is 140 lb (63.5 kg). Amanda [name] temporal temperature is 97.1 F (36.2 C) (abnormal). Amanda [name] blood pressure is 145/68 (abnormal) and Amanda [name] pulse is 62. Amanda [name] respiration is 18.   Wt Readings from Last 3 Encounters:  08/31/19 140 lb (63.5 kg)  06/16/19 134 lb (60.8 kg)  06/08/19 134 lb (60.8 kg)    Physical Exam Vitals reviewed.  HENT:     Head: Normocephalic and atraumatic.  Eyes:     Pupils: Pupils are equal, round, and reactive to light.  Cardiovascular:     Rate and Rhythm: Normal rate and regular rhythm.     Heart sounds: Normal heart sounds.  Pulmonary:     Effort: Pulmonary effort is normal.     Breath sounds: Normal breath sounds.  Abdominal:     General: Bowel sounds are normal.     Palpations: Abdomen is soft.  Musculoskeletal:        General: No tenderness or deformity. Normal range of motion.     Cervical back: Normal range of motion.  Lymphadenopathy:     Cervical: No cervical adenopathy.  Skin:    General: Skin is warm and dry.     Findings: No erythema or rash.  Neurological:     Mental Status: Amanda [name] is alert and oriented to person, place, and time.  Psychiatric:        Behavior: Behavior normal.        Thought Content: Thought content normal.        Judgment: Judgment normal.      Lab Results  Component Value Date   WBC 5.3 08/31/2019   HGB 12.3 08/31/2019   HCT 37.3 08/31/2019   MCV 85.0 08/31/2019   PLT 254 08/31/2019     Chemistry      Component Value Date/Time   NA 139 08/31/2019 0958   NA 146 (H) 05/26/2019 1108   K 4.5 08/31/2019 0958   CL 103 08/31/2019 0958   CO2 30 08/31/2019 0958    BUN 24 (H) 08/31/2019 0958   BUN 17 05/26/2019 1108   CREATININE 0.97 08/31/2019 0958   CREATININE 1.36 (H) 10/27/2015 1530   GLU 91 06/23/2018 0000      Component Value Date/Time   CALCIUM 9.6 08/31/2019 0958   CALCIUM 12.3 (H) 06/16/2018 1639   ALKPHOS 81 08/31/2019 0958   AST 13 (L) 08/31/2019 0958   ALT 9 08/31/2019 0958   BILITOT 0.3 08/31/2019 0958      Impression and Plan: Amanda [name] is a  very charming 79 year old white female.  Amanda [name] has a transformed marginal zone lymphoma.  I think Amanda [name] is doing fantastic.  I cannot find anything on exam that would suggest recurrence of Amanda [name] disease.  Amanda [name] will get Amanda [name] Rituxan today.  Amanda [name] is doing well with the Rituxan every 3 months.  We will have Amanda [name] come back after Labor Day.  We will make Amanda [name] get through the summer.  I know Amanda [name] will have a very nice summer.  It sounds like Amanda [name] will  be going up to [place] to be with family over the July 4 weekend.   Volanda Napoleon, MD 6/29/20211:54 PM

## 2019-08-31 NOTE — Telephone Encounter (Signed)
Called and advised patient of appt date/time changes that have been made.  She will get new appt info from My Chart per her/ 6/29 los

## 2019-08-31 NOTE — Progress Notes (Signed)
Oncology Nurse Navigator Documentation  Oncology Nurse Navigator Flowsheets 08/31/2019  Abnormal Finding Date -  Confirmed Diagnosis Date -  Diagnosis Status -  Planned Course of Treatment Chemotherapy  Phase of Treatment Chemo  Chemotherapy Actual Start Date: 07/21/2018  Chemotherapy Actual End Date: 11/03/2018  Navigator Follow Up Date: 11/23/2019  Navigator Follow Up Reason: Follow-up Appointment;Chemotherapy  Navigator Location [facility]  Referral Date to RadOnc/MedOnc -  Navigator Encounter Type Treatment  Telephone -  Treatment Initiated Date -  Patient Visit Type MedOnc  Treatment Phase Active Tx  Barriers/Navigation Needs No Barriers At This Time  Education -  Interventions Psycho-Social Support  Acuity Level 1-No Barriers  Referrals -  Coordination of Care -  Education Method -  Time Spent with Patient 30

## 2019-08-31 NOTE — Patient Instructions (Signed)
Rituximab injection What is this medicine? RITUXIMAB (ri TUX i mab) is a monoclonal antibody. It is used to treat certain types of cancer like non-Hodgkin lymphoma and chronic lymphocytic leukemia. It is also used to treat rheumatoid arthritis, granulomatosis with polyangiitis (or Wegener's granulomatosis), microscopic polyangiitis, and pemphigus vulgaris. This medicine may be used for other purposes; ask your health care provider or pharmacist if you have questions. COMMON BRAND NAME(S): Rituxan, RUXIENCE What should I tell my health care provider before I take this medicine? They need to know if you have any of these conditions:  heart disease  infection (especially a virus infection such as hepatitis B, chickenpox, cold sores, or herpes)  immune system problems  irregular heartbeat  kidney disease  low blood counts, like low white cell, platelet, or red cell counts  lung or breathing disease, like asthma  recently received or scheduled to receive a vaccine  an unusual or allergic reaction to rituximab, other medicines, foods, dyes, or preservatives  pregnant or trying to get pregnant  breast-feeding How should I use this medicine? This medicine is for infusion into a vein. It is administered in a hospital or clinic by a specially trained health care professional. A special MedGuide will be given to you by the pharmacist with each prescription and refill. Be sure to read this information carefully each time. Talk to your pediatrician regarding the use of this medicine in children. This medicine is not approved for use in children. Overdosage: If you think you have taken too much of this medicine contact a poison control center or emergency room at once. NOTE: This medicine is only for you. Do not share this medicine with others. What if I miss a dose? It is important not to miss a dose. Call your doctor or health care professional if you are unable to keep an appointment. What  may interact with this medicine?  cisplatin  live virus vaccines This list may not describe all possible interactions. Give your health care provider a list of all the medicines, herbs, non-prescription drugs, or dietary supplements you use. Also tell them if you smoke, drink alcohol, or use illegal drugs. Some items may interact with your medicine. What should I watch for while using this medicine? Your condition will be monitored carefully while you are receiving this medicine. You may need blood work done while you are taking this medicine. This medicine can cause serious allergic reactions. To reduce your risk you may need to take medicine before treatment with this medicine. Take your medicine as directed. In some patients, this medicine may cause a serious brain infection that may cause death. If you have any problems seeing, thinking, speaking, walking, or standing, tell your healthcare professional right away. If you cannot reach your healthcare professional, urgently seek other source of medical care. Call your doctor or health care professional for advice if you get a fever, chills or sore throat, or other symptoms of a cold or flu. Do not treat yourself. This drug decreases your body's ability to fight infections. Try to avoid being around people who are sick. Do not become pregnant while taking this medicine or for at least 12 months after stopping it. Women should inform their doctor if they wish to become pregnant or think they might be pregnant. There is a potential for serious side effects to an unborn child. Talk to your health care professional or pharmacist for more information. Do not breast-feed an infant while taking this medicine or for at   least 6 months after stopping it. What side effects may I notice from receiving this medicine? Side effects that you should report to your doctor or health care professional as soon as possible:  allergic reactions like skin rash, itching or  hives; swelling of the face, lips, or tongue  breathing problems  chest pain  changes in vision  diarrhea  headache with fever, neck stiffness, sensitivity to light, nausea, or confusion  fast, irregular heartbeat  loss of memory  low blood counts - this medicine may decrease the number of white blood cells, red blood cells and platelets. You may be at increased risk for infections and bleeding.  mouth sores  problems with balance, talking, or walking  redness, blistering, peeling or loosening of the skin, including inside the mouth  signs of infection - fever or chills, cough, sore throat, pain or difficulty passing urine  signs and symptoms of kidney injury like trouble passing urine or change in the amount of urine  signs and symptoms of liver injury like dark yellow or brown urine; general ill feeling or flu-like symptoms; light-colored stools; loss of appetite; nausea; right upper belly pain; unusually weak or tired; yellowing of the eyes or skin  signs and symptoms of low blood pressure like dizziness; feeling faint or lightheaded, falls; unusually weak or tired  stomach pain  swelling of the ankles, feet, hands  unusual bleeding or bruising  vomiting Side effects that usually do not require medical attention (report to your doctor or health care professional if they continue or are bothersome):  headache  joint pain  muscle cramps or muscle pain  nausea  tiredness This list may not describe all possible side effects. Call your doctor for medical advice about side effects. You may report side effects to FDA at 1-800-FDA-1088. Where should I keep my medicine? This drug is given in a hospital or clinic and will not be stored at home. NOTE: This sheet is a summary. It may not cover all possible information. If you have questions about this medicine, talk to your doctor, pharmacist, or health care provider.  2020 Elsevier/Gold Standard (2018-04-01  22:01:36)  

## 2019-09-01 ENCOUNTER — Encounter: Payer: Self-pay | Admitting: Family Medicine

## 2019-09-01 MED ORDER — AMLODIPINE BESYLATE 5 MG PO TABS: 5.0000 mg | ORAL_TABLET | Freq: Every day | ORAL | 0 refills | Status: DC

## 2019-09-08 ENCOUNTER — Other Ambulatory Visit: Payer: Self-pay | Admitting: *Deleted

## 2019-09-08 MED ORDER — BUPROPION HCL ER (XL) 150 MG PO TB24: 150.0000 mg | ORAL_TABLET | Freq: Every day | ORAL | 5 refills | Status: DC

## 2019-09-15 ENCOUNTER — Other Ambulatory Visit: Payer: Self-pay

## 2019-09-15 ENCOUNTER — Ambulatory Visit: Payer: Medicare Other | Attending: Neurosurgery | Admitting: Physical Therapy

## 2019-09-15 ENCOUNTER — Encounter: Payer: Self-pay | Admitting: Physical Therapy

## 2019-09-15 DIAGNOSIS — R252 Cramp and spasm: Secondary | ICD-10-CM | POA: Insufficient documentation

## 2019-09-15 DIAGNOSIS — M542 Cervicalgia: Secondary | ICD-10-CM | POA: Insufficient documentation

## 2019-09-15 DIAGNOSIS — M6281 Muscle weakness (generalized): Secondary | ICD-10-CM | POA: Insufficient documentation

## 2019-09-15 DIAGNOSIS — R52 Pain, unspecified: Secondary | ICD-10-CM | POA: Diagnosis not present

## 2019-09-15 DIAGNOSIS — M545 Low back pain, unspecified: Secondary | ICD-10-CM
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
## 2019-09-16 ENCOUNTER — Ambulatory Visit
Admission: RE | Admit: 2019-09-16 | Discharge: 2019-09-16 | Disposition: A | Discharge status: Home / Self Care | Payer: Medicare Other | Source: Ambulatory Visit | Attending: Obstetrics & Gynecology | Admitting: Obstetrics & Gynecology

## 2019-09-16 DIAGNOSIS — R928 Other abnormal and inconclusive findings on diagnostic imaging of breast: Secondary | ICD-10-CM | POA: Diagnosis not present

## 2019-09-16 DIAGNOSIS — N6310 Unspecified lump in the right breast, unspecified quadrant: Secondary | ICD-10-CM

## 2019-09-16 DIAGNOSIS — N641 Fat necrosis of breast: Secondary | ICD-10-CM | POA: Diagnosis not present

## 2019-09-17 ENCOUNTER — Encounter: Payer: Self-pay | Admitting: Physical Therapy

## 2019-09-17 ENCOUNTER — Ambulatory Visit: Payer: Medicare Other | Admitting: Physical Therapy

## 2019-09-17 ENCOUNTER — Other Ambulatory Visit: Payer: Self-pay

## 2019-09-17 DIAGNOSIS — M545 Low back pain, unspecified: Secondary | ICD-10-CM

## 2019-09-17 DIAGNOSIS — R52 Pain, unspecified: Secondary | ICD-10-CM | POA: Diagnosis not present

## 2019-09-17 DIAGNOSIS — M542 Cervicalgia: Secondary | ICD-10-CM | POA: Diagnosis not present

## 2019-09-17 DIAGNOSIS — M6281 Muscle weakness (generalized): Secondary | ICD-10-CM | POA: Diagnosis not present

## 2019-09-17 DIAGNOSIS — R252 Cramp and spasm: Secondary | ICD-10-CM | POA: Diagnosis not present
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
## 2019-09-17 NOTE — Patient Instructions (Signed)
Access Code: [email or web address] URL: [email or web address] Date: 09/17/2019 Prepared by: Amador Cunas  Exercises Seated Assisted Cervical Rotation with Towel - 1 x daily - 7 x weekly - 3 sets - 10 reps - 5 sec hold Cervical Extension AROM with Strap - 1 x daily - 7 x weekly - 3 sets - 10 reps - 5 sec hold Cervical Extension Stretch - 1 x daily - 7 x weekly - 3 sets - 10 reps - 5 sec hold

## 2019-09-21 ENCOUNTER — Other Ambulatory Visit: Payer: Self-pay

## 2019-09-21 ENCOUNTER — Ambulatory Visit: Payer: Medicare Other | Admitting: Physical Therapy

## 2019-09-21 DIAGNOSIS — M6281 Muscle weakness (generalized): Secondary | ICD-10-CM

## 2019-09-21 DIAGNOSIS — R52 Pain, unspecified: Secondary | ICD-10-CM

## 2019-09-21 DIAGNOSIS — M542 Cervicalgia: Secondary | ICD-10-CM | POA: Diagnosis not present

## 2019-09-21 DIAGNOSIS — R252 Cramp and spasm: Secondary | ICD-10-CM

## 2019-09-21 DIAGNOSIS — M545 Low back pain: Secondary | ICD-10-CM | POA: Diagnosis not present
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
## 2019-09-23 ENCOUNTER — Other Ambulatory Visit: Payer: Self-pay

## 2019-09-23 ENCOUNTER — Encounter: Payer: Self-pay | Admitting: Physical Therapy

## 2019-09-23 ENCOUNTER — Ambulatory Visit: Payer: Medicare Other | Admitting: Physical Therapy

## 2019-09-23 DIAGNOSIS — M542 Cervicalgia: Secondary | ICD-10-CM

## 2019-09-23 DIAGNOSIS — M545 Low back pain, unspecified: Secondary | ICD-10-CM

## 2019-09-23 DIAGNOSIS — R52 Pain, unspecified: Secondary | ICD-10-CM | POA: Diagnosis not present

## 2019-09-23 DIAGNOSIS — M6281 Muscle weakness (generalized): Secondary | ICD-10-CM | POA: Diagnosis not present

## 2019-09-23 DIAGNOSIS — R252 Cramp and spasm: Secondary | ICD-10-CM | POA: Diagnosis not present
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
## 2019-09-27 ENCOUNTER — Other Ambulatory Visit: Payer: Self-pay | Admitting: Family Medicine

## 2019-09-29 ENCOUNTER — Encounter: Payer: Self-pay | Admitting: Physical Therapy

## 2019-09-29 ENCOUNTER — Other Ambulatory Visit: Payer: Self-pay

## 2019-09-29 ENCOUNTER — Ambulatory Visit: Payer: Medicare Other | Admitting: Physical Therapy

## 2019-09-29 DIAGNOSIS — R52 Pain, unspecified: Secondary | ICD-10-CM

## 2019-09-29 DIAGNOSIS — M542 Cervicalgia: Secondary | ICD-10-CM | POA: Diagnosis not present

## 2019-09-29 DIAGNOSIS — M545 Low back pain, unspecified: Secondary | ICD-10-CM

## 2019-09-29 DIAGNOSIS — R252 Cramp and spasm: Secondary | ICD-10-CM | POA: Diagnosis not present

## 2019-09-29 DIAGNOSIS — M6281 Muscle weakness (generalized): Secondary | ICD-10-CM | POA: Diagnosis not present
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
## 2019-10-01 ENCOUNTER — Other Ambulatory Visit: Payer: Self-pay

## 2019-10-01 ENCOUNTER — Ambulatory Visit: Payer: Medicare Other | Admitting: Physical Therapy

## 2019-10-01 ENCOUNTER — Encounter: Payer: Self-pay | Admitting: Physical Therapy

## 2019-10-01 DIAGNOSIS — R52 Pain, unspecified: Secondary | ICD-10-CM | POA: Diagnosis not present

## 2019-10-01 DIAGNOSIS — M6281 Muscle weakness (generalized): Secondary | ICD-10-CM

## 2019-10-01 DIAGNOSIS — R252 Cramp and spasm: Secondary | ICD-10-CM | POA: Diagnosis not present

## 2019-10-01 DIAGNOSIS — M545 Low back pain, unspecified: Secondary | ICD-10-CM

## 2019-10-01 DIAGNOSIS — M542 Cervicalgia: Secondary | ICD-10-CM | POA: Diagnosis not present
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
## 2019-10-08 ENCOUNTER — Other Ambulatory Visit: Payer: Self-pay

## 2019-10-08 ENCOUNTER — Ambulatory Visit: Payer: Medicare Other | Attending: Neurosurgery | Admitting: Physical Therapy

## 2019-10-08 ENCOUNTER — Encounter: Payer: Self-pay | Admitting: Physical Therapy

## 2019-10-08 DIAGNOSIS — R52 Pain, unspecified: Secondary | ICD-10-CM

## 2019-10-08 DIAGNOSIS — R252 Cramp and spasm: Secondary | ICD-10-CM | POA: Diagnosis not present

## 2019-10-08 DIAGNOSIS — M6281 Muscle weakness (generalized): Secondary | ICD-10-CM | POA: Insufficient documentation

## 2019-10-08 DIAGNOSIS — M542 Cervicalgia: Secondary | ICD-10-CM | POA: Insufficient documentation

## 2019-10-08 DIAGNOSIS — M545 Low back pain, unspecified: Secondary | ICD-10-CM
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
## 2019-10-12 ENCOUNTER — Encounter: Payer: Medicare Other | Admitting: Physical Therapy

## 2019-10-13 ENCOUNTER — Encounter: Payer: Self-pay | Admitting: Physical Therapy

## 2019-10-13 ENCOUNTER — Other Ambulatory Visit: Payer: Self-pay

## 2019-10-13 ENCOUNTER — Ambulatory Visit: Payer: Medicare Other | Admitting: Physical Therapy

## 2019-10-13 DIAGNOSIS — R252 Cramp and spasm: Secondary | ICD-10-CM

## 2019-10-13 DIAGNOSIS — R52 Pain, unspecified: Secondary | ICD-10-CM

## 2019-10-13 DIAGNOSIS — M542 Cervicalgia: Secondary | ICD-10-CM | POA: Diagnosis not present

## 2019-10-13 DIAGNOSIS — M6281 Muscle weakness (generalized): Secondary | ICD-10-CM | POA: Diagnosis not present

## 2019-10-13 DIAGNOSIS — M545 Low back pain: Secondary | ICD-10-CM | POA: Diagnosis not present
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
## 2019-10-14 ENCOUNTER — Telehealth: Payer: Self-pay | Admitting: Family Medicine

## 2019-10-14 ENCOUNTER — Encounter: Payer: Medicare Other | Admitting: Physical Therapy

## 2019-10-14 NOTE — Progress Notes (Signed)
  Chronic Care Management   Note  10/14/2019 Name: Amanda [name] MRN: [id number] DOB: 12/29/1940  Amanda [name] is a 79 y.o. year old female who is a primary care patient of [name], DO. I reached out to [name] by phone today in response to a referral sent by Ms. Farrell Ours [facility]'s PCP, [name], DO.   [name] was given information about Chronic Care Management services today including:  1. CCM service includes personalized support from designated clinical staff supervised by her physician, including individualized plan of care and coordination with other care providers 2. 24/7 contact phone numbers for assistance for urgent and routine care needs. 3. Service will only be billed when office clinical staff spend 20 minutes or more in a month to coordinate care. 4. Only one practitioner may furnish and bill the service in a calendar month. 5. The patient may stop CCM services at any time (effective at the end of the month) by phone call to the office staff.   Patient agreed to services and verbal consent obtained.   Follow up plan:   Carley Perdue [employer] Scheduler

## 2019-10-15 ENCOUNTER — Other Ambulatory Visit: Payer: Self-pay

## 2019-10-15 ENCOUNTER — Ambulatory Visit: Payer: Medicare Other | Admitting: Physical Therapy

## 2019-10-15 ENCOUNTER — Encounter: Payer: Self-pay | Admitting: Physical Therapy

## 2019-10-15 DIAGNOSIS — R52 Pain, unspecified: Secondary | ICD-10-CM

## 2019-10-15 DIAGNOSIS — M542 Cervicalgia: Secondary | ICD-10-CM | POA: Diagnosis not present

## 2019-10-15 DIAGNOSIS — M6281 Muscle weakness (generalized): Secondary | ICD-10-CM

## 2019-10-15 DIAGNOSIS — R252 Cramp and spasm: Secondary | ICD-10-CM

## 2019-10-15 DIAGNOSIS — M545 Low back pain, unspecified: Secondary | ICD-10-CM
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
## 2019-10-18 ENCOUNTER — Other Ambulatory Visit: Payer: Self-pay

## 2019-10-18 ENCOUNTER — Ambulatory Visit: Payer: Medicare Other | Admitting: Physical Therapy

## 2019-10-18 ENCOUNTER — Encounter: Payer: Self-pay | Admitting: Physical Therapy

## 2019-10-18 DIAGNOSIS — M545 Low back pain, unspecified: Secondary | ICD-10-CM

## 2019-10-18 DIAGNOSIS — M6281 Muscle weakness (generalized): Secondary | ICD-10-CM | POA: Diagnosis not present

## 2019-10-18 DIAGNOSIS — M542 Cervicalgia: Secondary | ICD-10-CM | POA: Diagnosis not present

## 2019-10-18 DIAGNOSIS — R52 Pain, unspecified: Secondary | ICD-10-CM

## 2019-10-18 DIAGNOSIS — R252 Cramp and spasm: Secondary | ICD-10-CM | POA: Diagnosis not present
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
## 2019-10-19 ENCOUNTER — Encounter: Payer: Medicare Other | Admitting: Physical Therapy

## 2019-10-20 ENCOUNTER — Other Ambulatory Visit: Payer: Self-pay

## 2019-10-20 ENCOUNTER — Ambulatory Visit: Payer: Medicare Other | Admitting: Physical Therapy

## 2019-10-20 ENCOUNTER — Encounter: Payer: Self-pay | Admitting: Physical Therapy

## 2019-10-20 DIAGNOSIS — R52 Pain, unspecified: Secondary | ICD-10-CM

## 2019-10-20 DIAGNOSIS — M545 Low back pain, unspecified: Secondary | ICD-10-CM

## 2019-10-20 DIAGNOSIS — R252 Cramp and spasm: Secondary | ICD-10-CM

## 2019-10-20 DIAGNOSIS — M542 Cervicalgia: Secondary | ICD-10-CM

## 2019-10-20 DIAGNOSIS — M6281 Muscle weakness (generalized): Secondary | ICD-10-CM
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
## 2019-10-21 ENCOUNTER — Encounter: Payer: Medicare Other | Admitting: Physical Therapy

## 2019-11-01 DIAGNOSIS — M4722 Other spondylosis with radiculopathy, cervical region: Secondary | ICD-10-CM | POA: Diagnosis not present

## 2019-11-17 DIAGNOSIS — Z961 Presence of intraocular lens: Secondary | ICD-10-CM | POA: Diagnosis not present

## 2019-11-17 DIAGNOSIS — H35033 Hypertensive retinopathy, bilateral: Secondary | ICD-10-CM | POA: Diagnosis not present

## 2019-11-17 DIAGNOSIS — H353132 Nonexudative age-related macular degeneration, bilateral, intermediate dry stage: Secondary | ICD-10-CM | POA: Diagnosis not present

## 2019-11-17 DIAGNOSIS — H40013 Open angle with borderline findings, low risk, bilateral: Secondary | ICD-10-CM | POA: Diagnosis not present

## 2019-11-23 ENCOUNTER — Other Ambulatory Visit: Payer: Medicare Other

## 2019-11-23 ENCOUNTER — Ambulatory Visit: Payer: Medicare Other | Admitting: Hematology

## 2019-11-23 ENCOUNTER — Ambulatory Visit: Payer: Medicare Other | Admitting: Hematology & Oncology

## 2019-11-23 ENCOUNTER — Ambulatory Visit: Payer: Medicare Other

## 2019-11-30 ENCOUNTER — Inpatient Hospital Stay: Payer: Medicare Other | Attending: Hematology

## 2019-11-30 ENCOUNTER — Inpatient Hospital Stay: Payer: Medicare Other

## 2019-11-30 ENCOUNTER — Telehealth: Payer: Self-pay | Admitting: Family

## 2019-11-30 ENCOUNTER — Other Ambulatory Visit: Payer: Self-pay

## 2019-11-30 ENCOUNTER — Encounter: Payer: Self-pay | Admitting: Family

## 2019-11-30 ENCOUNTER — Inpatient Hospital Stay (HOSPITAL_BASED_OUTPATIENT_CLINIC_OR_DEPARTMENT_OTHER): Payer: Medicare Other | Admitting: Family

## 2019-11-30 ENCOUNTER — Encounter: Payer: Self-pay | Admitting: *Deleted

## 2019-11-30 VITALS — BP 163/69 | HR 69 | Temp 97.8°F | Resp 17 | Ht 61.0 in | Wt 147.0 lb

## 2019-11-30 DIAGNOSIS — C8304 Small cell B-cell lymphoma, lymph nodes of axilla and upper limb: Secondary | ICD-10-CM | POA: Insufficient documentation

## 2019-11-30 DIAGNOSIS — E785 Hyperlipidemia, unspecified: Secondary | ICD-10-CM

## 2019-11-30 DIAGNOSIS — E039 Hypothyroidism, unspecified: Secondary | ICD-10-CM

## 2019-11-30 DIAGNOSIS — C8580 Other specified types of non-Hodgkin lymphoma, unspecified site: Secondary | ICD-10-CM

## 2019-11-30 DIAGNOSIS — Z79899 Other long term (current) drug therapy: Secondary | ICD-10-CM | POA: Insufficient documentation

## 2019-11-30 DIAGNOSIS — Z881 Allergy status to other antibiotic agents status: Secondary | ICD-10-CM | POA: Insufficient documentation

## 2019-11-30 DIAGNOSIS — Z5112 Encounter for antineoplastic immunotherapy: Secondary | ICD-10-CM | POA: Insufficient documentation

## 2019-11-30 DIAGNOSIS — R200 Anesthesia of skin: Secondary | ICD-10-CM | POA: Diagnosis not present

## 2019-11-30 DIAGNOSIS — Z888 Allergy status to other drugs, medicaments and biological substances status: Secondary | ICD-10-CM | POA: Diagnosis not present

## 2019-11-30 DIAGNOSIS — M545 Low back pain: Secondary | ICD-10-CM | POA: Diagnosis not present

## 2019-11-30 DIAGNOSIS — G8929 Other chronic pain: Secondary | ICD-10-CM | POA: Insufficient documentation

## 2019-11-30 DIAGNOSIS — R202 Paresthesia of skin: Secondary | ICD-10-CM | POA: Insufficient documentation

## 2019-11-30 LAB — CMP (CANCER CENTER ONLY)
ALT: 10 U/L (ref 0–44)
AST: 13 U/L — ABNORMAL LOW (ref 15–41)
Albumin: 4.2 g/dL (ref 3.5–5.0)
Alkaline Phosphatase: 64 U/L (ref 38–126)
Anion gap: 7 (ref 5–15)
BUN: 19 mg/dL (ref 8–23)
CO2: 31 mmol/L (ref 22–32)
Calcium: 10.2 mg/dL (ref 8.9–10.3)
Chloride: 104 mmol/L (ref 98–111)
Creatinine: 0.86 mg/dL (ref 0.44–1.00)
GFR, Est AFR Am: >60 mL/min (ref >60)
GFR, Estimated: >60 mL/min (ref >60)
Glucose, Bld: 97 mg/dL (ref 70–99)
Potassium: 4.6 mmol/L (ref 3.5–5.1)
Sodium: 142 mmol/L (ref 135–145)
Total Bilirubin: 0.3 mg/dL (ref 0.3–1.2)
Total Protein: 6.1 g/dL — ABNORMAL LOW (ref 6.5–8.1)

## 2019-11-30 LAB — CBC WITH DIFFERENTIAL (CANCER CENTER ONLY)
Abs Immature Granulocytes: 0.02 10*3/uL (ref 0.00–0.07)
Basophils Absolute: 0.1 10*3/uL (ref 0.0–0.1)
Basophils Relative: 1 %
Eosinophils Absolute: 0.3 10*3/uL (ref 0.0–0.5)
Eosinophils Relative: 5 %
HCT: 37.5 % (ref 36.0–46.0)
Hemoglobin: 12.4 g/dL (ref 12.0–15.0)
Immature Granulocytes: 0 %
Lymphocytes Relative: 18 %
Lymphs Abs: 1 10*3/uL (ref 0.7–4.0)
MCH: 27.9 pg (ref 26.0–34.0)
MCHC: 33.1 g/dL (ref 30.0–36.0)
MCV: 84.3 fL (ref 80.0–100.0)
Monocytes Absolute: 0.8 10*3/uL (ref 0.1–1.0)
Monocytes Relative: 15 %
Neutro Abs: 3.1 10*3/uL (ref 1.7–7.7)
Neutrophils Relative %: 61 %
Platelet Count: 239 10*3/uL (ref 150–400)
RBC: 4.45 MIL/uL (ref 3.87–5.11)
RDW: 13.6 % (ref 11.5–15.5)
WBC Count: 5.2 10*3/uL (ref 4.0–10.5)
nRBC: 0 % (ref 0.0–0.2)

## 2019-11-30 LAB — LACTATE DEHYDROGENASE: LDH: 96 U/L — ABNORMAL LOW (ref 98–192)

## 2019-11-30 MED ORDER — HEPARIN SOD (PORK) LOCK FLUSH 100 UNIT/ML IV SOLN
500.0000 [IU] | Freq: Once | INTRAVENOUS | Status: AC
  Administered 2019-11-30: 500 [IU] via INTRAVENOUS
  Filled 2019-11-30: qty 5

## 2019-11-30 MED ORDER — SODIUM CHLORIDE 0.9 % IV SOLN
375.0000 mg/m2 | Freq: Once | INTRAVENOUS | Status: AC
  Administered 2019-11-30: 600 mg via INTRAVENOUS
  Filled 2019-11-30: qty 50

## 2019-11-30 MED ORDER — SODIUM CHLORIDE 0.9% FLUSH
10.0000 mL | INTRAVENOUS | Status: DC | PRN
  Administered 2019-11-30: 10 mL via INTRAVENOUS
  Filled 2019-11-30: qty 10

## 2019-11-30 MED ORDER — ALTEPLASE 2 MG IJ SOLR
2.0000 mg | Freq: Once | INTRAMUSCULAR | Status: AC
  Administered 2019-11-30: 2 mg
  Filled 2019-11-30: qty 2

## 2019-11-30 MED ORDER — SODIUM CHLORIDE 0.9 % IV SOLN
Freq: Once | INTRAVENOUS | Status: DC
  Filled 2019-11-30: qty 250

## 2019-11-30 MED ORDER — DIPHENHYDRAMINE HCL 25 MG PO CAPS
ORAL_CAPSULE | ORAL | Status: AC
  Filled 2019-11-30: qty 2

## 2019-11-30 MED ORDER — ACETAMINOPHEN 325 MG PO TABS
650.0000 mg | ORAL_TABLET | Freq: Once | ORAL | Status: AC
  Administered 2019-11-30: 650 mg via ORAL

## 2019-11-30 MED ORDER — DIPHENHYDRAMINE HCL 25 MG PO CAPS
50.0000 mg | ORAL_CAPSULE | Freq: Once | ORAL | Status: AC
  Administered 2019-11-30: 50 mg via ORAL

## 2019-11-30 MED ORDER — STERILE WATER FOR INJECTION IJ SOLN
INTRAMUSCULAR | Status: AC
  Filled 2019-11-30: qty 10

## 2019-11-30 MED ORDER — ALTEPLASE 2 MG IJ SOLR
INTRAMUSCULAR | Status: AC
  Filled 2019-11-30: qty 2

## 2019-11-30 MED ORDER — ACETAMINOPHEN 325 MG PO TABS
ORAL_TABLET | ORAL | Status: AC
  Filled 2019-11-30: qty 2

## 2019-11-30 NOTE — Patient Instructions (Signed)
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
## 2019-11-30 NOTE — Patient Instructions (Signed)
Rituximab injection What is this medicine? RITUXIMAB (ri TUX i mab) is a monoclonal antibody. It is used to treat certain types of cancer like non-Hodgkin lymphoma and chronic lymphocytic leukemia. It is also used to treat rheumatoid arthritis, granulomatosis with polyangiitis (or Wegener's granulomatosis), microscopic polyangiitis, and pemphigus vulgaris. This medicine may be used for other purposes; ask your health care provider or pharmacist if you have questions. COMMON BRAND NAME(S): Rituxan, RUXIENCE What should I tell my health care provider before I take this medicine? They need to know if you have any of these conditions:  heart disease  infection (especially a virus infection such as hepatitis B, chickenpox, cold sores, or herpes)  immune system problems  irregular heartbeat  kidney disease  low blood counts, like low white cell, platelet, or red cell counts  lung or breathing disease, like asthma  recently received or scheduled to receive a vaccine  an unusual or allergic reaction to rituximab, other medicines, foods, dyes, or preservatives  pregnant or trying to get pregnant  breast-feeding How should I use this medicine? This medicine is for infusion into a vein. It is administered in a hospital or clinic by a specially trained health care professional. A special MedGuide will be given to you by the pharmacist with each prescription and refill. Be sure to read this information carefully each time. Talk to your pediatrician regarding the use of this medicine in children. This medicine is not approved for use in children. Overdosage: If you think you have taken too much of this medicine contact a poison control center or emergency room at once. NOTE: This medicine is only for you. Do not share this medicine with others. What if I miss a dose? It is important not to miss a dose. Call your doctor or health care professional if you are unable to keep an appointment. What  may interact with this medicine?  cisplatin  live virus vaccines This list may not describe all possible interactions. Give your health care provider a list of all the medicines, herbs, non-prescription drugs, or dietary supplements you use. Also tell them if you smoke, drink alcohol, or use illegal drugs. Some items may interact with your medicine. What should I watch for while using this medicine? Your condition will be monitored carefully while you are receiving this medicine. You may need blood work done while you are taking this medicine. This medicine can cause serious allergic reactions. To reduce your risk you may need to take medicine before treatment with this medicine. Take your medicine as directed. In some patients, this medicine may cause a serious brain infection that may cause death. If you have any problems seeing, thinking, speaking, walking, or standing, tell your healthcare professional right away. If you cannot reach your healthcare professional, urgently seek other source of medical care. Call your doctor or health care professional for advice if you get a fever, chills or sore throat, or other symptoms of a cold or flu. Do not treat yourself. This drug decreases your body's ability to fight infections. Try to avoid being around people who are sick. Do not become pregnant while taking this medicine or for at least 12 months after stopping it. Women should inform their doctor if they wish to become pregnant or think they might be pregnant. There is a potential for serious side effects to an unborn child. Talk to your health care professional or pharmacist for more information. Do not breast-feed an infant while taking this medicine or for at   least 6 months after stopping it. What side effects may I notice from receiving this medicine? Side effects that you should report to your doctor or health care professional as soon as possible:  allergic reactions like skin rash, itching or  hives; swelling of the face, lips, or tongue  breathing problems  chest pain  changes in vision  diarrhea  headache with fever, neck stiffness, sensitivity to light, nausea, or confusion  fast, irregular heartbeat  loss of memory  low blood counts - this medicine may decrease the number of white blood cells, red blood cells and platelets. You may be at increased risk for infections and bleeding.  mouth sores  problems with balance, talking, or walking  redness, blistering, peeling or loosening of the skin, including inside the mouth  signs of infection - fever or chills, cough, sore throat, pain or difficulty passing urine  signs and symptoms of kidney injury like trouble passing urine or change in the amount of urine  signs and symptoms of liver injury like dark yellow or brown urine; general ill feeling or flu-like symptoms; light-colored stools; loss of appetite; nausea; right upper belly pain; unusually weak or tired; yellowing of the eyes or skin  signs and symptoms of low blood pressure like dizziness; feeling faint or lightheaded, falls; unusually weak or tired  stomach pain  swelling of the ankles, feet, hands  unusual bleeding or bruising  vomiting Side effects that usually do not require medical attention (report to your doctor or health care professional if they continue or are bothersome):  headache  joint pain  muscle cramps or muscle pain  nausea  tiredness This list may not describe all possible side effects. Call your doctor for medical advice about side effects. You may report side effects to FDA at 1-800-FDA-1088. Where should I keep my medicine? This drug is given in a hospital or clinic and will not be stored at home. NOTE: This sheet is a summary. It may not cover all possible information. If you have questions about this medicine, talk to your doctor, pharmacist, or health care provider.  2020 Elsevier/Gold Standard (2018-04-01  22:01:36)  

## 2019-11-30 NOTE — Telephone Encounter (Signed)
Appointments scheduled calendar printed per 9/28 los

## 2019-11-30 NOTE — Progress Notes (Signed)
Oncology Nurse Navigator Documentation  Oncology Nurse Navigator Flowsheets 11/30/2019  Abnormal Finding Date -  Confirmed Diagnosis Date -  Diagnosis Status -  Planned Course of Treatment -  Phase of Treatment -  Chemotherapy Actual Start Date: -  Chemotherapy Actual End Date: -  Navigator Follow Up Date: 02/29/2020  Navigator Follow Up Reason: Follow-up Appointment;Chemotherapy  Navigator Location [facility]  Referral Date to RadOnc/MedOnc -  Navigator Encounter Type Appt/Treatment Plan Review;Treatment  Telephone -  Treatment Initiated Date -  Patient Visit Type MedOnc  Treatment Phase Active Tx  Barriers/Navigation Needs No Barriers At This Time  Education -  Interventions Psycho-Social Support  Acuity Level 1-No Barriers  Referrals -  Coordination of Care -  Education Method -  Time Spent with Patient 15

## 2019-12-03 ENCOUNTER — Other Ambulatory Visit: Payer: Self-pay | Admitting: Family Medicine

## 2019-12-03 ENCOUNTER — Encounter: Payer: Self-pay | Admitting: Family Medicine

## 2019-12-07 ENCOUNTER — Encounter: Payer: Self-pay | Admitting: Family Medicine

## 2019-12-07 ENCOUNTER — Ambulatory Visit (INDEPENDENT_AMBULATORY_CARE_PROVIDER_SITE_OTHER): Payer: Medicare Other | Admitting: Family Medicine

## 2019-12-07 ENCOUNTER — Other Ambulatory Visit: Payer: Self-pay

## 2019-12-07 VITALS — BP 120/80 | HR 75 | Temp 97.9°F | Resp 18 | Ht 61.0 in | Wt 144.8 lb

## 2019-12-07 DIAGNOSIS — G8929 Other chronic pain: Secondary | ICD-10-CM

## 2019-12-07 DIAGNOSIS — M542 Cervicalgia: Secondary | ICD-10-CM

## 2019-12-07 DIAGNOSIS — E785 Hyperlipidemia, unspecified: Secondary | ICD-10-CM

## 2019-12-07 DIAGNOSIS — E039 Hypothyroidism, unspecified: Secondary | ICD-10-CM | POA: Diagnosis not present

## 2019-12-07 DIAGNOSIS — M549 Dorsalgia, unspecified: Secondary | ICD-10-CM

## 2019-12-07 DIAGNOSIS — Z23 Encounter for immunization: Secondary | ICD-10-CM

## 2019-12-07 DIAGNOSIS — C8592 Non-Hodgkin lymphoma, unspecified, intrathoracic lymph nodes: Secondary | ICD-10-CM

## 2019-12-07 DIAGNOSIS — I10 Essential (primary) hypertension: Secondary | ICD-10-CM

## 2019-12-07 DIAGNOSIS — J479 Bronchiectasis, uncomplicated: Secondary | ICD-10-CM | POA: Diagnosis not present

## 2019-12-07 DIAGNOSIS — D696 Thrombocytopenia, unspecified: Secondary | ICD-10-CM | POA: Diagnosis not present

## 2019-12-07 NOTE — Progress Notes (Signed)
Patient ID: Amanda [name], female    DOB: 1941-01-26  Age: 79 y.o. MRN: [id number]    Subjective:  Subjective  HPI Amanda [name] presents for neck and back pain.  She is under the care of neurosurgery and takes tylenol  She also needs refills and labs  No other complaints No new injury   Review of Systems  Constitutional: Negative for appetite change, diaphoresis, fatigue and unexpected weight change.  Eyes: Negative for pain, redness and visual disturbance.  Respiratory: Negative for cough, chest tightness, shortness of breath and wheezing.   Cardiovascular: Negative for chest pain, palpitations and leg swelling.  Endocrine: Negative for cold intolerance, heat intolerance, polydipsia, polyphagia and polyuria.  Genitourinary: Negative for difficulty urinating, dysuria and frequency.  Musculoskeletal: Positive for back pain and neck pain.  Neurological: Negative for dizziness, light-headedness, numbness and headaches.    History Past Medical History:  Diagnosis Date  . Anemia   . Arthritis   . Cancer ([place])   . Constipation, chronic   . Essential hypertension   . GERD (gastroesophageal reflux disease)    zantac  . Heart murmur   . History of blood transfusion [place]  . Hyperlipidemia   . Hypertension   . Hyperthyroidism   . Hypothyroidism   . Lymphoproliferative disorder ([place])   . Macular degeneration 2013   Both eyes   . Osteopenia   . Pneumonia   . PONV (postoperative nausea and vomiting)    needs little anesthesia  . Shingles   . Shortness of breath    on exertion  . Spleen enlarged   . SUI (stress urinary incontinence, female)   . Wears glasses     She has a past surgical history that includes Tonsillectomy and adenoidectomy; Cesarean section (2876); Wisdom tooth extraction; Cataract extraction (2009, 2011); Tubal ligation; Dilation and curettage of uterus; Carpal tunnel release (1999); Colonoscopy; Hysteroscopy with D & C (01/07/2012); Lymph node  biopsy (Left, 05/26/2018); IR IMAGING GUIDED PORT INSERTION (07/15/2018); ORIF ankle fracture (Left, 12/12/2018); Cesarean section; Tonsillectomy; Cataract extraction, bilateral; ORIF ankle fracture (Left, 12/12/2018); and Breast excisional biopsy (Left, 1980).   Her family history includes Breast cancer (age of onset: 70) in her mother; COPD in her paternal grandfather; Diabetes in her father; Hyperlipidemia in her brother; Hypertension in her father; Kidney failure in her father; Ovarian cancer in her mother; Prostate cancer in her father; Stroke in her maternal grandfather.She reports that she has never smoked. She has never used smokeless tobacco. She reports current alcohol use. She reports that she does not use drugs.  Current Outpatient Medications on File Prior to Visit  Medication Sig Dispense Refill  . acetaminophen (TYLENOL) 650 MG CR tablet Take 650-1,300 mg by mouth every 6 (six) hours as needed for pain.    [name] amLODipine (NORVASC) 5 MG tablet Take 1 tablet (5 mg total) by mouth daily. 90 tablet 0  . aspirin 81 MG chewable tablet Chew 1 tablet (81 mg total) by mouth daily.    [name] buPROPion (WELLBUTRIN XL) 150 MG 24 hr tablet Take 1 tablet (150 mg total) by mouth daily. 30 tablet 5  . famotidine (PEPCID) 20 MG tablet One at bedtime (Patient taking differently: Take 20 mg by mouth daily as needed for heartburn or indigestion. ) 30 tablet 11  . levothyroxine (SYNTHROID) 125 MCG tablet TAKE 1 TABLET(125 MCG) BY MOUTH DAILY BEFORE BREAKFAST 90 tablet 0  . Multiple Vitamins-Minerals (EYE VITAMINS PO) Take 1 tablet by mouth  daily.    . omeprazole (PRILOSEC) 20 MG capsule Take 1 capsule (20 mg total) by mouth daily. 90 capsule 1  . ondansetron (ZOFRAN) 4 MG tablet Take 1 tablet (4 mg total) by mouth every 8 (eight) hours as needed for nausea or vomiting. 20 tablet 0  . traZODone (DESYREL) 50 MG tablet Take 1 tablet (50 mg total) by mouth at bedtime as needed for sleep. 30 tablet 1   Current  Facility-Administered Medications on File Prior to Visit  Medication Dose Route Frequency Provider Last Rate Last Admin  . diphenhydrAMINE (BENADRYL) capsule 50 mg  50 mg Oral Once Tish Men, MD      . heparin lock flush 100 unit/mL  500 Units Intracatheter Once PRN Tish Men, MD      . sodium chloride flush (NS) 0.9 % injection 10 mL  10 mL Intracatheter PRN Tish Men, MD         Objective:  Objective  Physical Exam Vitals and nursing note reviewed.  Constitutional:      Appearance: She is well-developed.  HENT:     Head: Normocephalic and atraumatic.  Eyes:     Conjunctiva/sclera: Conjunctivae normal.  Neck:     Thyroid: No thyromegaly.     Vascular: No carotid bruit or JVD.  Cardiovascular:     Rate and Rhythm: Normal rate and regular rhythm.     Heart sounds: Normal heart sounds. No murmur heard.   Pulmonary:     Effort: Pulmonary effort is normal. No respiratory distress.     Breath sounds: Normal breath sounds. No wheezing or rales.  Chest:     Chest wall: No tenderness.  Musculoskeletal:        General: Tenderness present.     Cervical back: Normal range of motion and neck supple. Tenderness and bony tenderness present. Pain with movement present.     Thoracic back: Bony tenderness present. Decreased range of motion.     Lumbar back: Tenderness present. Negative left straight leg raise test.  Neurological:     Mental Status: She is alert and oriented to person, place, and time.    BP 120/80 (BP Location: Left Arm, Patient Position: Sitting, Cuff Size: Normal)   Pulse 75   Temp 97.9 F (36.6 C) (Oral)   Resp 18   Ht 5\' 1"  (1.549 m)   Wt 144 lb 12.8 oz (65.7 kg)   SpO2 96%   BMI 27.36 kg/m  Wt Readings from Last 3 Encounters:  12/07/19 144 lb 12.8 oz (65.7 kg)  11/30/19 147 lb (66.7 kg)  08/31/19 140 lb (63.5 kg)     Lab Results  Component Value Date   WBC 5.2 11/30/2019   HGB 12.4 11/30/2019   HCT 37.5 11/30/2019   PLT 239 11/30/2019   GLUCOSE 97  11/30/2019   CHOL 186 02/17/2017   TRIG 115 05/17/2019   HDL 60.40 02/17/2017   LDLDIRECT 142.8 11/22/2010   LDLCALC 101 (H) 02/17/2017   ALT 10 11/30/2019   AST 13 (L) 11/30/2019   NA 142 11/30/2019   K 4.6 11/30/2019   CL 104 11/30/2019   CREATININE 0.86 11/30/2019   BUN 19 11/30/2019   CO2 31 11/30/2019   TSH 0.582 06/16/2018   INR 1.0 12/12/2018   HGBA1C 6.1 (H) 11/18/2014    US BREAST LTD UNI RIGHT INC AXILLA  Result Date: 09/16/2019 CLINICAL DATA:  79 year old female for six-month follow-up of probable RIGHT breast fat necrosis. EXAM: DIGITAL DIAGNOSTIC RIGHT MAMMOGRAM WITH  CAD AND TOMO ULTRASOUND RIGHT BREAST COMPARISON:  Previous exam(s). ACR Breast Density Category b: There are scattered areas of fibroglandular density. FINDINGS: 2D/3D full field views of the RIGHT breast demonstrate decreased asymmetry within the [facility] RIGHT breast. No new or suspicious mammographic findings are noted within the RIGHT breast. Mammographic images were processed with CAD. Targeted ultrasound is performed, showing decreased size of hyperechoic area/cystic changes at the 2 o'clock position of the RIGHT breast, compatible with posttraumatic/fat necrosis changes. IMPRESSION: 1. Decreased posttraumatic/fat necrosis changes within the INNER RIGHT breast. 2. No mammographic evidence of RIGHT breast malignancy. RECOMMENDATION: Bilateral screening mammogram in January 2022 to resume annual mammogram schedule. I have discussed the findings and recommendations with the patient. If applicable, a reminder letter will be sent to the patient regarding the next appointment. BI-RADS CATEGORY  2: Benign. Electronically Signed   By: Margarette Canada M.D.   On: 09/16/2019 14:17   MM DIAG BREAST TOMO UNI RIGHT  Result Date: 09/16/2019 CLINICAL DATA:  79 year old female for six-month follow-up of probable RIGHT breast fat necrosis. EXAM: DIGITAL DIAGNOSTIC RIGHT MAMMOGRAM WITH CAD AND TOMO ULTRASOUND RIGHT BREAST COMPARISON:   Previous exam(s). ACR Breast Density Category b: There are scattered areas of fibroglandular density. FINDINGS: 2D/3D full field views of the RIGHT breast demonstrate decreased asymmetry within the [facility] RIGHT breast. No new or suspicious mammographic findings are noted within the RIGHT breast. Mammographic images were processed with CAD. Targeted ultrasound is performed, showing decreased size of hyperechoic area/cystic changes at the 2 o'clock position of the RIGHT breast, compatible with posttraumatic/fat necrosis changes. IMPRESSION: 1. Decreased posttraumatic/fat necrosis changes within the INNER RIGHT breast. 2. No mammographic evidence of RIGHT breast malignancy. RECOMMENDATION: Bilateral screening mammogram in January 2022 to resume annual mammogram schedule. I have discussed the findings and recommendations with the patient. If applicable, a reminder letter will be sent to the patient regarding the next appointment. BI-RADS CATEGORY  2: Benign. Electronically Signed   By: Margarette Canada M.D.   On: 09/16/2019 14:17     Assessment & Plan:  Plan  I have discontinued Danikah C. Ames's chlorpheniramine-HYDROcodone and benzonatate. I am also having her maintain her aspirin, famotidine, traZODone, ondansetron, Multiple Vitamins-Minerals (EYE VITAMINS PO), acetaminophen, omeprazole, buPROPion, levothyroxine, and amLODipine.  No orders of the defined types were placed in this encounter.   Problem List Items Addressed This Visit      Unprioritized   Chronic neck and back pain   Relevant Orders   Ambulatory referral to Chiropractic   Hyperlipidemia    Encouraged heart healthy diet, increase exercise, avoid trans fats, consider a krill oil cap daily      Relevant Orders   Lipid panel   TSH   Comprehensive metabolic panel   Hypothyroidism (Chronic)    Check labs  con't synthroid       Non-Hodgkin's lymphoma [facility])    Per hematology      Primary hypertension - Primary    Well controlled, no  changes to meds. Encouraged heart healthy diet such as the DASH diet and exercise as tolerated.     con't norvasc       Relevant Orders   Lipid panel   TSH   Comprehensive metabolic panel    Other Visit Diagnoses    Need for influenza vaccination       Relevant Orders   Flu Vaccine QUAD High Dose(Fluad) (Completed)   Bronchiectasis, uncomplicated ([facility])   (Chronic)     Thrombocytopenia, unspecified ([place])   (  Chronic)        Follow-up: Return in about 6 months (around 06/06/2020), or if symptoms worsen or fail to improve, for hypertension, hyperlipidemia.  [name], DO

## 2019-12-07 NOTE — Assessment & Plan Note (Signed)
Check labs con't synthroid 

## 2019-12-07 NOTE — Assessment & Plan Note (Signed)
Well controlled, no changes to meds. Encouraged heart healthy diet such as the DASH diet and exercise as tolerated.     con't norvasc

## 2019-12-07 NOTE — Patient Instructions (Signed)
DASH Eating Plan DASH stands for "Dietary Approaches to Stop Hypertension." The DASH eating plan is a healthy eating plan that has been shown to reduce high blood pressure (hypertension). It may also reduce your risk for type 2 diabetes, heart disease, and stroke. The DASH eating plan may also help with weight loss. What are tips for following this plan?  General guidelines  Avoid eating more than 2,300 mg (milligrams) of salt (sodium) a day. If you have hypertension, you may need to reduce your sodium intake to 1,500 mg a day.  Limit alcohol intake to no more than 1 drink a day for nonpregnant women and 2 drinks a day for men. One drink equals 12 oz of beer, 5 oz of wine, or 1 oz of hard liquor.  Work with your health care provider to maintain a healthy body weight or to lose weight. Ask what an ideal weight is for you.  Get at least 30 minutes of exercise that causes your heart to beat faster (aerobic exercise) most days of the week. Activities may include walking, swimming, or biking.  Work with your health care provider or diet and nutrition specialist (dietitian) to adjust your eating plan to your individual calorie needs. Reading food labels   Check food labels for the amount of sodium per serving. Choose foods with less than 5 percent of the Daily Value of sodium. Generally, foods with less than 300 mg of sodium per serving fit into this eating plan.  To find whole grains, look for the word "whole" as the first word in the ingredient list. Shopping  Buy products labeled as "low-sodium" or "no salt added."  Buy fresh foods. Avoid canned foods and premade or frozen meals. Cooking  Avoid adding salt when cooking. Use salt-free seasonings or herbs instead of table salt or sea salt. Check with your health care provider or pharmacist before using salt substitutes.  Do not fry foods. Cook foods using healthy methods such as baking, boiling, grilling, and broiling instead.  Cook with  heart-healthy oils, such as olive, canola, soybean, or sunflower oil. Meal planning  Eat a balanced diet that includes: ? 5 or more servings of fruits and vegetables each day. At each meal, try to fill half of your plate with fruits and vegetables. ? Up to 6-8 servings of whole grains each day. ? Less than 6 oz of lean meat, poultry, or fish each day. A 3-oz serving of meat is about the same size as a deck of cards. One egg equals 1 oz. ? 2 servings of low-fat dairy each day. ? A serving of nuts, seeds, or beans 5 times each week. ? Heart-healthy fats. Healthy fats called Omega-3 fatty acids are found in foods such as flaxseeds and coldwater fish, like sardines, salmon, and mackerel.  Limit how much you eat of the following: ? Canned or prepackaged foods. ? Food that is high in trans fat, such as fried foods. ? Food that is high in saturated fat, such as fatty meat. ? Sweets, desserts, sugary drinks, and other foods with added sugar. ? Full-fat dairy products.  Do not salt foods before eating.  Try to eat at least 2 vegetarian meals each week.  Eat more home-cooked food and less restaurant, buffet, and fast food.  When eating at a restaurant, ask that your food be prepared with less salt or no salt, if possible. What foods are recommended? The items listed may not be a complete list. Talk with your dietitian about   what dietary choices are best for you. Grains Whole-grain or whole-wheat bread. Whole-grain or whole-wheat pasta. Brown rice. Oatmeal. Quinoa. Bulgur. Whole-grain and low-sodium cereals. Pita bread. Low-fat, low-sodium crackers. Whole-wheat flour tortillas. Vegetables Fresh or frozen vegetables (raw, steamed, roasted, or grilled). Low-sodium or reduced-sodium tomato and vegetable juice. Low-sodium or reduced-sodium tomato sauce and tomato paste. Low-sodium or reduced-sodium canned vegetables. Fruits All fresh, dried, or frozen fruit. Canned fruit in natural juice (without  added sugar). Meat and other protein foods Skinless chicken or turkey. Ground chicken or turkey. Pork with fat trimmed off. Fish and seafood. Egg whites. Dried beans, peas, or lentils. Unsalted nuts, nut butters, and seeds. Unsalted canned beans. Lean cuts of beef with fat trimmed off. Low-sodium, lean deli meat. Dairy Low-fat (1%) or fat-free (skim) milk. Fat-free, low-fat, or reduced-fat cheeses. Nonfat, low-sodium ricotta or cottage cheese. Low-fat or nonfat yogurt. Low-fat, low-sodium cheese. Fats and oils Soft margarine without trans fats. Vegetable oil. Low-fat, reduced-fat, or light mayonnaise and salad dressings (reduced-sodium). Canola, safflower, olive, soybean, and sunflower oils. Avocado. Seasoning and other foods Herbs. Spices. Seasoning mixes without salt. Unsalted popcorn and pretzels. Fat-free sweets. What foods are not recommended? The items listed may not be a complete list. Talk with your dietitian about what dietary choices are best for you. Grains Baked goods made with fat, such as croissants, muffins, or some breads. Dry pasta or rice meal packs. Vegetables Creamed or fried vegetables. Vegetables in a cheese sauce. Regular canned vegetables (not low-sodium or reduced-sodium). Regular canned tomato sauce and paste (not low-sodium or reduced-sodium). Regular tomato and vegetable juice (not low-sodium or reduced-sodium). Pickles. Olives. Fruits Canned fruit in a light or heavy syrup. Fried fruit. Fruit in cream or butter sauce. Meat and other protein foods Fatty cuts of meat. Ribs. Fried meat. Bacon. Sausage. Bologna and other processed lunch meats. Salami. Fatback. Hotdogs. Bratwurst. Salted nuts and seeds. Canned beans with added salt. Canned or smoked fish. Whole eggs or egg yolks. Chicken or turkey with skin. Dairy Whole or 2% milk, cream, and half-and-half. Whole or full-fat cream cheese. Whole-fat or sweetened yogurt. Full-fat cheese. Nondairy creamers. Whipped toppings.  Processed cheese and cheese spreads. Fats and oils Butter. Stick margarine. Lard. Shortening. Ghee. Bacon fat. Tropical oils, such as coconut, palm kernel, or palm oil. Seasoning and other foods Salted popcorn and pretzels. Onion salt, garlic salt, seasoned salt, table salt, and sea salt. Worcestershire sauce. Tartar sauce. Barbecue sauce. Teriyaki sauce. Soy sauce, including reduced-sodium. Steak sauce. Canned and packaged gravies. Fish sauce. Oyster sauce. Cocktail sauce. Horseradish that you find on the shelf. Ketchup. Mustard. Meat flavorings and tenderizers. Bouillon cubes. Hot sauce and Tabasco sauce. Premade or packaged marinades. Premade or packaged taco seasonings. Relishes. Regular salad dressings. Where to find more information:  National Heart, Lung, and Blood Institute: www.nhlbi.nih.gov  American Heart Association: www.heart.org Summary  The DASH eating plan is a healthy eating plan that has been shown to reduce high blood pressure (hypertension). It may also reduce your risk for type 2 diabetes, heart disease, and stroke.  With the DASH eating plan, you should limit salt (sodium) intake to 2,300 mg a day. If you have hypertension, you may need to reduce your sodium intake to 1,500 mg a day.  When on the DASH eating plan, aim to eat more fresh fruits and vegetables, whole grains, lean proteins, low-fat dairy, and heart-healthy fats.  Work with your health care provider or diet and nutrition specialist (dietitian) to adjust your eating plan to your   individual calorie needs. This information is not intended to replace advice given to you by your health care provider. Make sure you discuss any questions you have with your health care provider. Document Revised: 01/31/2017 Document Reviewed: 02/12/2016 Elsevier Patient Education  2020 Elsevier Inc.  

## 2019-12-07 NOTE — Assessment & Plan Note (Signed)
Per hematology 

## 2019-12-07 NOTE — Assessment & Plan Note (Signed)
Encouraged heart healthy diet, increase exercise, avoid trans fats, consider a krill oil cap daily 

## 2019-12-08 ENCOUNTER — Ambulatory Visit: Payer: Medicare Other | Admitting: Pharmacist

## 2019-12-08 ENCOUNTER — Encounter: Payer: Self-pay | Admitting: Family Medicine

## 2019-12-08 ENCOUNTER — Telehealth: Payer: Self-pay | Admitting: Pharmacist

## 2019-12-08 DIAGNOSIS — I10 Essential (primary) hypertension: Secondary | ICD-10-CM

## 2019-12-08 DIAGNOSIS — E785 Hyperlipidemia, unspecified: Secondary | ICD-10-CM

## 2019-12-08 LAB — COMPREHENSIVE METABOLIC PANEL
AG Ratio: 2.6 (calc) — ABNORMAL HIGH (ref 1.0–2.5)
ALT: 13 U/L (ref 6–29)
AST: 14 U/L (ref 10–35)
Albumin: 4.2 g/dL (ref 3.6–5.1)
Alkaline phosphatase (APISO): 62 U/L (ref 37–153)
BUN: 15 mg/dL (ref 7–25)
CO2: 28 mmol/L (ref 20–32)
Calcium: 8.8 mg/dL (ref 8.6–10.4)
Chloride: 104 mmol/L (ref 98–110)
Creat: 0.82 mg/dL (ref 0.60–0.88)
Globulin: 1.6 g/dL (calc) — ABNORMAL LOW (ref 1.9–3.7)
Glucose, Bld: 93 mg/dL (ref 65–99)
Potassium: 4.1 mmol/L (ref 3.5–5.3)
Sodium: 141 mmol/L (ref 135–146)
Total Bilirubin: 0.3 mg/dL (ref 0.2–1.2)
Total Protein: 5.8 g/dL — ABNORMAL LOW (ref 6.1–8.1)

## 2019-12-08 LAB — LIPID PANEL
Cholesterol: 149 mg/dL (ref <200)
HDL: 45 mg/dL — ABNORMAL LOW (ref >=50)
LDL Cholesterol (Calc): 84 mg/dL (calc)
Non-HDL Cholesterol (Calc): 104 mg/dL (calc) (ref <130)
Total CHOL/HDL Ratio: 3.3 (calc) (ref <5.0)
Triglycerides: 102 mg/dL (ref <150)

## 2019-12-08 LAB — TSH: TSH: 0.91 mIU/L (ref 0.40–4.50)

## 2019-12-08 MED ORDER — ONDANSETRON HCL 4 MG PO TABS: 4.0000 mg | ORAL_TABLET | Freq: Three times a day (TID) | ORAL | 0 refills | Status: DC | PRN
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
## 2019-12-08 NOTE — Patient Instructions (Signed)
Visit Information  Goals Addressed            This Visit's Progress   . Chronic Care Management Pharmacy Care Plan       CARE PLAN ENTRY (see longitudinal plan of care for additional care plan information)  Current Barriers:  . Chronic Disease Management support, education, and care coordination needs related to Hypertension, Hyperlipidemia, Pre-Diabetes, Hypothyroidism, Depression, GERD, Osteopenia   Hypertension BP Readings from Last 3 Encounters:  12/07/19 120/80  11/30/19 (!) 156/61  11/30/19 (!) 163/69   . Pharmacist Clinical Goal(s): o Over the next 90 days, patient will work with PharmD and providers to maintain BP goal <140/90 . Current regimen:  o Amlodipine 5mg  daily at breakfast . Interventions: o Requested patient to check her BP 1-2 times per week and record . Patient self care activities - Over the next 90 days, patient will: o Check BP 1-2 times per week, document, and provide at future appointments o Ensure daily salt intake < 2300 mg/Timber Marshman  Hyperlipidemia Lab Results  Component Value Date/Time   LDLCALC 84 12/07/2019 02:29 PM   LDLDIRECT 142.8 11/22/2010 09:04 AM   . Pharmacist Clinical Goal(s): o Over the next 90 days, patient will work with PharmD and providers to maintain LDL goal < 100 . Current regimen:  o Diet and exercise management   . Interventions: o Discussed diet and exercise . Patient self care activities - Over the next 90 days, patient will: o Maintain LDL less than 100  Pre-Diabetes Lab Results  Component Value Date/Time   HGBA1C 6.1 (H) 11/18/2014 12:57 AM   HGBA1C 5.8 11/29/2011 03:51 PM   . Pharmacist Clinical Goal(s): o Over the next 90 days, patient will work with PharmD and providers to maintain A1c goal <6.5% . Current regimen:  o Diet and exercise management   . Interventions: o Discussed diet and exercise o Consider repeat a1c at next office visit . Patient self care activities - Over the next 90 days, patient  will: o Maintain a1c <6.5%  GERD . Pharmacist Clinical Goal(s) o Over the next 90 days, patient will work with PharmD and providers to reduce symptoms associated with GERD and decrease polypharmacy . Current regimen:  . Famotidine 20mg  as needed  . Omeprazole 20mg  daily . Interventions: o Discussed the risk/benefit of continuation vs discontinuation of long term PPI use  o Discussed omeprazole tapering plan - Week 1: Take every other Allyne Hebert (can do this for 1-2 weeks noting you get symptoms next Fraidy Mccarrick w/o omeperazole) - Week 2: Take every 3rd Shatia Sindoni (2 days between doses) - Week 3: Take every 4th Keylah Darwish/Twice a week (3 days between doses) - Week: 4: Take once a week - Week 5: Discontinue . Patient self care activities - Over the next 90 days, patient will: o Consider tapering omeprazole  Osteopenia . Pharmacist Clinical Goal(s) o Over the next 180 days, patient will work with PharmD and providers to reduce risk of fracture associated with osteopenia . Current regimen:  o Undetermined . Interventions: o Discussed repeat bone density scan . Patient self care activities - Over the next 180 days, patient will: o Consider repeat DEXA Scan  Health Maintenance  . Pharmacist Clinical Goal(s) o Over the next 90 days, patient will work with PharmD and providers to complete health maintenance screenings/vaccinations . Interventions: o Discussed immunization record o Confirmed [facility] DOES NOT have the date listed for the Shingrix vaccine . Patient self care activities - Over the next 90 days, patient  will: o Confirm dates of Shingrix vaccine or complete Shingrix vaccine series    Medication management . Pharmacist Clinical Goal(s): o Over the next 90 days, patient will work with PharmD and providers to maintain optimal medication adherence . Current pharmacy: [facility] . Interventions o Comprehensive medication review performed. o Continue current medication management strategy . Patient  self care activities - Over the next 90 days, patient will: o Focus on medication adherence by filling and taking medications appropriately  o Take medications as prescribed o Report any questions or concerns to PharmD and/or provider(s)  Initial goal documentation        Ms. Mittal was given information about Chronic Care Management services today including:  1. CCM service includes personalized support from designated clinical staff supervised by her physician, including individualized plan of care and coordination with other care providers 2. 24/7 contact phone numbers for assistance for urgent and routine care needs. 3. Standard insurance, coinsurance, copays and deductibles apply for chronic care management only during months in which we provide at least 20 minutes of these services. Most insurances cover these services at 100%, however patients may be responsible for any copay, coinsurance and/or deductible if applicable. This service may help you avoid the need for more expensive face-to-face services. 4. Only one practitioner may furnish and bill the service in a calendar month. 5. The patient may stop CCM services at any time (effective at the end of the month) by phone call to the office staff.  Patient agreed to services and verbal consent obtained.   The patient verbalized understanding of instructions provided today and agreed to receive a mailed copy of patient instruction and/or educational materials. Telephone follow up appointment with pharmacy team member scheduled for: 03/09/2020  Melvenia Beam Miral Hoopes, PharmD Clinical Pharmacist [facility] [phone]   How to Take Your Blood Pressure You can take your blood pressure at home with a machine. You may need to check your blood pressure at home: To check if you have high blood pressure (hypertension). To check your blood pressure over time. To make sure your blood pressure medicine is  working. Supplies needed: You will need a blood pressure machine, or monitor. You can buy one at a drugstore or online. When choosing one: Choose one with an arm cuff. Choose one that wraps around your upper arm. Only one finger should fit between your arm and the cuff. Do not choose one that measures your blood pressure from your wrist or finger. Your doctor can suggest a monitor. How to prepare Avoid these things for 30 minutes before checking your blood pressure: Drinking caffeine. Drinking alcohol. Eating. Smoking. Exercising. Five minutes before checking your blood pressure: Pee. Sit in a dining chair. Avoid sitting in a soft couch or armchair. Be quiet. Do not talk. How to take your blood pressure Follow the instructions that came with your machine. If you have a digital blood pressure monitor, these may be the instructions: Sit up straight. Place your feet on the floor. Do not cross your ankles or legs. Rest your left arm at the level of your heart. You may rest it on a table, desk, or chair. Pull up your shirt sleeve. Wrap the blood pressure cuff around the upper part of your left arm. The cuff should be 1 inch (2.5 cm) above your elbow. It is best to wrap the cuff around bare skin. Fit the cuff snugly around your arm. You should be able to place only one  finger between the cuff and your arm. Put the cord inside the groove of your elbow. Press the power button. Sit quietly while the cuff fills with air and loses air. Write down the numbers on the screen. Wait 2-3 minutes and then repeat steps 1-10. What do the numbers mean? Two numbers make up your blood pressure. The first number is called systolic pressure. The second is called diastolic pressure. An example of a blood pressure reading is "120 over 80" (or 120/80). If you are an adult and do not have a medical condition, use this guide to find out if your blood pressure is normal: Normal First number: below 120. Second  number: below 80. Elevated First number: 120-129. Second number: below 80. Hypertension stage 1 First number: 130-139. Second number: 80-89. Hypertension stage 2 First number: 140 or above. Second number: 76 or above. Your blood pressure is above normal even if only the top or bottom number is above normal. Follow these instructions at home: Check your blood pressure as often as your doctor tells you to. Take your monitor to your next doctor's appointment. Your doctor will: Make sure you are using it correctly. Make sure it is working right. Make sure you understand what your blood pressure numbers should be. Tell your doctor if your medicines are causing side effects. Contact a doctor if: Your blood pressure keeps being high. Get help right away if: Your first blood pressure number is higher than 180. Your second blood pressure number is higher than 120. This information is not intended to replace advice given to you by your health care provider. Make sure you discuss any questions you have with your health care provider. Document Revised: 01/31/2017 Document Reviewed: 07/28/2015 Elsevier Patient Education  2020 [employer].
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
## 2019-12-13 ENCOUNTER — Other Ambulatory Visit: Payer: Self-pay | Admitting: Family Medicine

## 2019-12-13 DIAGNOSIS — E2839 Other primary ovarian failure: Secondary | ICD-10-CM

## 2019-12-13 DIAGNOSIS — E039 Hypothyroidism, unspecified: Secondary | ICD-10-CM

## 2019-12-13 DIAGNOSIS — I10 Essential (primary) hypertension: Secondary | ICD-10-CM

## 2019-12-13 DIAGNOSIS — R739 Hyperglycemia, unspecified: Secondary | ICD-10-CM

## 2019-12-16 DIAGNOSIS — Z20822 Contact with and (suspected) exposure to covid-19: Secondary | ICD-10-CM | POA: Diagnosis not present

## 2019-12-30 ENCOUNTER — Other Ambulatory Visit: Payer: Self-pay | Admitting: Internal Medicine

## 2020-01-01 ENCOUNTER — Other Ambulatory Visit: Payer: Self-pay | Admitting: Family Medicine

## 2020-01-03 ENCOUNTER — Encounter: Payer: Self-pay | Admitting: Family Medicine

## 2020-01-06 ENCOUNTER — Other Ambulatory Visit: Payer: Self-pay | Admitting: Family Medicine

## 2020-01-12 ENCOUNTER — Telehealth: Payer: Self-pay | Admitting: Pharmacist
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
## 2020-02-07 ENCOUNTER — Encounter: Payer: Self-pay | Admitting: Family Medicine

## 2020-02-09 ENCOUNTER — Other Ambulatory Visit: Payer: Self-pay | Admitting: Family Medicine

## 2020-02-11 ENCOUNTER — Encounter: Payer: Self-pay | Admitting: Family Medicine

## 2020-02-11 ENCOUNTER — Ambulatory Visit (INDEPENDENT_AMBULATORY_CARE_PROVIDER_SITE_OTHER): Payer: Medicare Other | Admitting: Family Medicine

## 2020-02-11 ENCOUNTER — Other Ambulatory Visit: Payer: Self-pay

## 2020-02-11 VITALS — BP 130/70 | HR 71 | Temp 98.4°F | Resp 18 | Ht 61.0 in | Wt 145.0 lb

## 2020-02-11 DIAGNOSIS — M542 Cervicalgia: Secondary | ICD-10-CM

## 2020-02-11 DIAGNOSIS — R202 Paresthesia of skin: Secondary | ICD-10-CM

## 2020-02-11 DIAGNOSIS — M549 Dorsalgia, unspecified: Secondary | ICD-10-CM | POA: Diagnosis not present

## 2020-02-11 DIAGNOSIS — R200 Anesthesia of skin: Secondary | ICD-10-CM

## 2020-02-11 DIAGNOSIS — J40 Bronchitis, not specified as acute or chronic: Secondary | ICD-10-CM | POA: Diagnosis not present

## 2020-02-11 DIAGNOSIS — M545 Low back pain, unspecified: Secondary | ICD-10-CM | POA: Diagnosis not present

## 2020-02-11 DIAGNOSIS — G8929 Other chronic pain: Secondary | ICD-10-CM | POA: Diagnosis not present

## 2020-02-11 MED ORDER — HYDROCOD POLST-CPM POLST ER 10-8 MG/5ML PO SUER: 5.0000 mL | Freq: Two times a day (BID) | ORAL | 0 refills | Status: DC | PRN

## 2020-02-11 MED ORDER — PROMETHAZINE-DM 6.25-15 MG/5ML PO SYRP: 5.0000 mL | ORAL_SOLUTION | Freq: Four times a day (QID) | ORAL | 0 refills | Status: DC | PRN

## 2020-02-11 MED ORDER — PREDNISONE 10 MG PO TABS: ORAL_TABLET | ORAL | 0 refills | Status: DC

## 2020-02-11 MED ORDER — METHYLPREDNISOLONE ACETATE 80 MG/ML IJ SUSP
80.0000 mg | Freq: Once | INTRAMUSCULAR | Status: AC
  Administered 2020-02-11: 80 mg via INTRAMUSCULAR

## 2020-02-11 MED ORDER — AZITHROMYCIN 250 MG PO TABS: ORAL_TABLET | ORAL | 0 refills | Status: DC

## 2020-02-11 NOTE — Assessment & Plan Note (Signed)
z pak pred taper  Depo medrol IM Cough med per orders If symptoms worsen call office Pt will get covid test

## 2020-02-11 NOTE — Progress Notes (Signed)
Patient ID: Amanda [name], female    DOB: 06/11/1940  Age: 79 y.o. MRN: [id number]    Subjective:  Subjective  HPI Amanda [name] presents for numbness L hand that is constant --- x  1 month.   She also c/o neck and back pain.  Dr Cyndy Freeze said there is nothing more he can do and she is still having pain  She is also c/o cough, congestion and wheezing   Review of Systems  Constitutional: Negative for chills and fever.  HENT: Positive for congestion. Negative for ear pain, postnasal drip, sinus pressure, sinus pain and sore throat.   Respiratory: Positive for cough and wheezing. Negative for shortness of breath.   Musculoskeletal: Positive for back pain and neck pain.  Neurological: Positive for numbness.    History Past Medical History:  Diagnosis Date  . Anemia   . Arthritis   . Cancer ([place])   . Constipation, chronic   . Essential hypertension   . GERD (gastroesophageal reflux disease)    zantac  . Heart murmur   . History of blood transfusion [place]  . Hyperlipidemia   . Hypertension   . Hyperthyroidism   . Hypothyroidism   . Lymphoproliferative disorder ([place])   . Macular degeneration 2013   Both eyes   . Osteopenia   . Pneumonia   . PONV (postoperative nausea and vomiting)    needs little anesthesia  . Shingles   . Shortness of breath    on exertion  . Spleen enlarged   . SUI (stress urinary incontinence, female)   . Wears glasses     She has a past surgical history that includes Tonsillectomy and adenoidectomy; Cesarean section (8115); Wisdom tooth extraction; Cataract extraction (2009, 2011); Tubal ligation; Dilation and curettage of uterus; Carpal tunnel release (1999); Colonoscopy; Hysteroscopy with D & C (01/07/2012); Lymph node biopsy (Left, 05/26/2018); IR IMAGING GUIDED PORT INSERTION (07/15/2018); ORIF ankle fracture (Left, 12/12/2018); Cesarean section; Tonsillectomy; Cataract extraction, bilateral; ORIF ankle fracture (Left, 12/12/2018); and Breast  excisional biopsy (Left, 1980).   Her family history includes Breast cancer (age of onset: 56) in her mother; COPD in her paternal grandfather; Diabetes in her father; Hyperlipidemia in her brother; Hypertension in her father; Kidney failure in her father; Ovarian cancer in her mother; Prostate cancer in her father; Stroke in her maternal grandfather.She reports that she has never smoked. She has never used smokeless tobacco. She reports current alcohol use. She reports that she does not use drugs.  Current Outpatient Medications on File Prior to Visit  Medication Sig Dispense Refill  . acetaminophen (TYLENOL) 650 MG CR tablet Take 650-1,300 mg by mouth every 6 (six) hours as needed for pain.    [name] amLODipine (NORVASC) 5 MG tablet Take 1 tablet (5 mg total) by mouth daily. 90 tablet 0  . aspirin 81 MG chewable tablet Chew 1 tablet (81 mg total) by mouth daily.    [name] buPROPion (WELLBUTRIN XL) 150 MG 24 hr tablet Take 1 tablet (150 mg total) by mouth daily. 90 tablet 0  . famotidine (PEPCID) 20 MG tablet One at bedtime (Patient taking differently: Take 20 mg by mouth daily as needed for heartburn or indigestion.) 30 tablet 11  . levothyroxine (SYNTHROID) 125 MCG tablet Take 1 tablet (125 mcg total) by mouth daily before breakfast. 90 tablet 1  . Multiple Vitamins-Minerals (EYE VITAMINS PO) Take 1 tablet by mouth daily.    [name] omeprazole (PRILOSEC) 20 MG capsule TAKE 1  CAPSULE(20 MG) BY MOUTH DAILY 90 capsule 1  . ondansetron (ZOFRAN) 4 MG tablet Take 1 tablet (4 mg total) by mouth every 8 (eight) hours as needed for nausea or vomiting. 20 tablet 0  . traZODone (DESYREL) 50 MG tablet Take 1 tablet (50 mg total) by mouth at bedtime as needed for sleep. 30 tablet 1   Current Facility-Administered Medications on File Prior to Visit  Medication Dose Route Frequency Provider Last Rate Last Admin  . diphenhydrAMINE (BENADRYL) capsule 50 mg  50 mg Oral Once Tish Men, MD      . heparin lock flush 100 unit/mL   500 Units Intracatheter Once PRN Tish Men, MD      . sodium chloride flush (NS) 0.9 % injection 10 mL  10 mL Intracatheter PRN Tish Men, MD         Objective:  Objective  Physical Exam Vitals and nursing note reviewed.  Constitutional:      Appearance: She is well-developed and well-nourished.  HENT:     Head: Normocephalic and atraumatic.     Right Ear: Tympanic membrane normal.     Left Ear: Tympanic membrane normal.     Nose: Congestion present.  Eyes:     Extraocular Movements: EOM normal.     Conjunctiva/sclera: Conjunctivae normal.  Neck:     Thyroid: No thyromegaly.     Vascular: No carotid bruit or JVD.  Cardiovascular:     Rate and Rhythm: Normal rate and regular rhythm.     Heart sounds: Normal heart sounds. No murmur heard.   Pulmonary:     Effort: Pulmonary effort is normal. No respiratory distress.     Breath sounds: Wheezing and rhonchi present. No rales.  Chest:     Chest wall: No tenderness.  Musculoskeletal:        General: No edema.     Cervical back: Normal range of motion and neck supple.  Neurological:     Mental Status: She is alert and oriented to person, place, and time.     Comments: Numbness in L hand  No weakness   Psychiatric:        Mood and Affect: Mood and affect normal.    BP 130/70 (BP Location: Left Arm, Patient Position: Sitting, Cuff Size: Normal)   Pulse 71   Temp 98.4 F (36.9 C) (Oral)   Resp 18   Ht 5\' 1"  (1.549 m)   Wt 145 lb (65.8 kg)   SpO2 95%   BMI 27.40 kg/m  Wt Readings from Last 3 Encounters:  02/11/20 145 lb (65.8 kg)  12/07/19 144 lb 12.8 oz (65.7 kg)  11/30/19 147 lb (66.7 kg)     Lab Results  Component Value Date   WBC 5.2 11/30/2019   HGB 12.4 11/30/2019   HCT 37.5 11/30/2019   PLT 239 11/30/2019   GLUCOSE 93 12/07/2019   CHOL 149 12/07/2019   TRIG 102 12/07/2019   HDL 45 (L) 12/07/2019   LDLDIRECT 142.8 11/22/2010   LDLCALC 84 12/07/2019   ALT 13 12/07/2019   AST 14 12/07/2019   NA 141  12/07/2019   K 4.1 12/07/2019   CL 104 12/07/2019   CREATININE 0.82 12/07/2019   BUN 15 12/07/2019   CO2 28 12/07/2019   TSH 0.91 12/07/2019   INR 1.0 12/12/2018   HGBA1C 6.1 (H) 11/18/2014    US BREAST LTD UNI RIGHT INC AXILLA  Result Date: 09/16/2019 CLINICAL DATA:  79 year old female for six-month follow-up of probable  RIGHT breast fat necrosis. EXAM: DIGITAL DIAGNOSTIC RIGHT MAMMOGRAM WITH CAD AND TOMO ULTRASOUND RIGHT BREAST COMPARISON:  Previous exam(s). ACR Breast Density Category b: There are scattered areas of fibroglandular density. FINDINGS: 2D/3D full field views of the RIGHT breast demonstrate decreased asymmetry within the [facility] RIGHT breast. No new or suspicious mammographic findings are noted within the RIGHT breast. Mammographic images were processed with CAD. Targeted ultrasound is performed, showing decreased size of hyperechoic area/cystic changes at the 2 o'clock position of the RIGHT breast, compatible with posttraumatic/fat necrosis changes. IMPRESSION: 1. Decreased posttraumatic/fat necrosis changes within the INNER RIGHT breast. 2. No mammographic evidence of RIGHT breast malignancy. RECOMMENDATION: Bilateral screening mammogram in January 2022 to resume annual mammogram schedule. I have discussed the findings and recommendations with the patient. If applicable, a reminder letter will be sent to the patient regarding the next appointment. BI-RADS CATEGORY  2: Benign. Electronically Signed   By: Margarette Canada M.D.   On: 09/16/2019 14:17   MM DIAG BREAST TOMO UNI RIGHT  Result Date: 09/16/2019 CLINICAL DATA:  79 year old female for six-month follow-up of probable RIGHT breast fat necrosis. EXAM: DIGITAL DIAGNOSTIC RIGHT MAMMOGRAM WITH CAD AND TOMO ULTRASOUND RIGHT BREAST COMPARISON:  Previous exam(s). ACR Breast Density Category b: There are scattered areas of fibroglandular density. FINDINGS: 2D/3D full field views of the RIGHT breast demonstrate decreased asymmetry within the  [facility] RIGHT breast. No new or suspicious mammographic findings are noted within the RIGHT breast. Mammographic images were processed with CAD. Targeted ultrasound is performed, showing decreased size of hyperechoic area/cystic changes at the 2 o'clock position of the RIGHT breast, compatible with posttraumatic/fat necrosis changes. IMPRESSION: 1. Decreased posttraumatic/fat necrosis changes within the INNER RIGHT breast. 2. No mammographic evidence of RIGHT breast malignancy. RECOMMENDATION: Bilateral screening mammogram in January 2022 to resume annual mammogram schedule. I have discussed the findings and recommendations with the patient. If applicable, a reminder letter will be sent to the patient regarding the next appointment. BI-RADS CATEGORY  2: Benign. Electronically Signed   By: Margarette Canada M.D.   On: 09/16/2019 14:17     Assessment & Plan:  Plan  I am having Amanda [name] maintain her aspirin, famotidine, traZODone, Multiple Vitamins-Minerals (EYE VITAMINS PO), acetaminophen, amLODipine, ondansetron, omeprazole, levothyroxine, and buPROPion.  No orders of the defined types were placed in this encounter.   Problem List Items Addressed This Visit   None     Follow-up: No follow-ups on file.  [name], DO

## 2020-02-11 NOTE — Assessment & Plan Note (Signed)
Refer to Dr Nelva Bush L hand numbness-- ? If from neck vs cts?

## 2020-02-11 NOTE — Patient Instructions (Signed)
Acute Bronchitis, Adult  Acute bronchitis is sudden or acute swelling of the air tubes (bronchi) in the lungs. Acute bronchitis causes these tubes to fill with mucus, which can make it hard to breathe. It can also cause coughing or wheezing. In adults, acute bronchitis usually goes away within 2 weeks. A cough caused by bronchitis may last up to 3 weeks. Smoking, allergies, and asthma can make the condition worse. What are the causes? This condition can be caused by germs and by substances that irritate the lungs, including:  Cold and flu viruses. The most common cause of this condition is the virus that causes the common cold.  Bacteria.  Substances that irritate the lungs, including: ? Smoke from cigarettes and other forms of tobacco. ? Dust and pollen. ? Fumes from chemical products, gases, or burned fuel. ? Other materials that pollute indoor or outdoor air.  Close contact with someone who has acute bronchitis. What increases the risk? The following factors may make you more likely to develop this condition:  A weak body's defense system, also called the immune system.  A condition that affects your lungs and breathing, such as asthma. What are the signs or symptoms? Common symptoms of this condition include:  Lung and breathing problems, such as: ? Coughing. This may bring up clear, yellow, or green mucus from your lungs (sputum). ? Wheezing. ? Having too much mucus in your lungs (chest congestion). ? Having shortness of breath.  A fever.  Chills.  Aches and pains, including: ? Tightness in your chest and other body aches. ? A sore throat. How is this diagnosed? This condition is usually diagnosed based on:  Your symptoms and medical history.  A physical exam. You may also have other tests, including tests to rule out other conditions, such pneumonia. These tests include:  A test of lung function.  Test of a mucus sample to look for the presence of  bacteria.  Tests to check the oxygen level in your blood.  Blood tests.  Chest X-ray. How is this treated? Most cases of acute bronchitis clear up over time without treatment. Your health care provider may recommend:  Drinking more fluids. This can thin your mucus, which may improve your breathing.  Taking a medicine for a fever or cough.  Using a device that gets medicine into your lungs (inhaler) to help improve breathing and control coughing.  Using a vaporizer or a humidifier. These are machines that add water to the air to help you breathe better. Follow these instructions at home: Activity  Get plenty of rest.  Return to your normal activities as told by your health care provider. Ask your health care provider what activities are safe for you. Lifestyle  Drink enough fluid to keep your urine pale yellow.  Do not drink alcohol.  Do not use any products that contain nicotine or tobacco, such as cigarettes, e-cigarettes, and chewing tobacco. If you need help quitting, ask your health care provider. Be aware that: ? Your bronchitis will get worse if you smoke or breathe in other people's smoke (secondhand smoke). ? Your lungs will heal faster if you quit smoking. General instructions   Take over-the-counter and prescription medicines only as told by your health care provider.  Use an inhaler, vaporizer, or humidifier as told by your health care provider.  If you have a sore throat, gargle with a salt-water mixture 3-4 times a day or as needed. To make a salt-water mixture, completely dissolve -1 tsp (3-6   g) of salt in 1 cup (237 mL) of warm water.  Keep all follow-up visits as told by your health care provider. This is important. How is this prevented? To lower your risk of getting this condition again:  Wash your hands often with soap and water. If soap and water are not available, use hand sanitizer.  Avoid contact with people who have cold symptoms.  Try not to  touch your mouth, nose, or eyes with your hands.  Avoid places where there are fumes from chemicals. Breathing these fumes will make your condition worse.  Get the flu shot every year. Contact a health care provider if:  Your symptoms do not improve after 2 weeks of treatment.  You vomit more than once or twice.  You have symptoms of dehydration such as: ? Dark urine. ? Dry skin or eyes. ? Increased thirst. ? Headaches. ? Confusion. ? Muscle cramps. Get help right away if you:  Cough up blood.  Feel pain in your chest.  Have severe shortness of breath.  Faint or keep feeling like you are going to faint.  Have a severe headache.  Have fever or chills that get worse. These symptoms may represent a serious problem that is an emergency. Do not wait to see if the symptoms will go away. Get medical help right away. Call your local emergency services (911 in the [place] Do not drive yourself to the hospital. Summary  Acute bronchitis is sudden (acute) inflammation of the air tubes (bronchi) between the windpipe and the lungs. In adults, acute bronchitis usually goes away within 2 weeks, although coughing may last 3 weeks or longer  Take over-the-counter and prescription medicines only as told by your health care provider.  Drink enough fluid to keep your urine pale yellow.  Contact a health care provider if your symptoms do not improve after 2 weeks of treatment.  Get help right away if you cough up blood, faint, or have chest pain or shortness of breath. This information is not intended to replace advice given to you by your health care provider. Make sure you discuss any questions you have with your health care provider. Document Revised: 11/02/2018 Document Reviewed: 09/11/2018 Elsevier Patient Education  2020 [employer]  

## 2020-02-11 NOTE — Assessment & Plan Note (Addendum)
?   From neck vs cts Refer to ortho  Pt did not want wrist splint

## 2020-02-14 NOTE — Telephone Encounter (Signed)
Please advisee

## 2020-02-14 NOTE — Telephone Encounter (Signed)
Amanda [name] is in [place]?  Did someone take his place in [place]?   He must have just moved to [place]----we will have look into it

## 2020-02-15 ENCOUNTER — Telehealth: Payer: Self-pay | Admitting: *Deleted

## 2020-02-15 NOTE — Telephone Encounter (Signed)
Patient was called about her appt with ortho and it was for in [place].  Can we get her somewhere in [place]?

## 2020-02-15 NOTE — Telephone Encounter (Signed)
Phenergan dm 1 tsp po qid prn

## 2020-02-15 NOTE — Telephone Encounter (Signed)
Plan does not cover Tussionex.    Called to see if patient went ahead to pickup but had to leave a message.    If she did not get is there anything you can change it to or recommend?  If she has Medicare Part D, for most cough syrups they do not cover.

## 2020-02-16 NOTE — Telephone Encounter (Signed)
Patient called back and she already picked up the Tussionex.  Rx not needed for the phenergan.

## 2020-02-16 NOTE — Telephone Encounter (Signed)
Noted  

## 2020-02-17 ENCOUNTER — Encounter: Payer: Self-pay | Admitting: Family Medicine

## 2020-02-17 ENCOUNTER — Other Ambulatory Visit: Payer: Self-pay | Admitting: Family Medicine

## 2020-02-17 ENCOUNTER — Telehealth: Payer: Self-pay | Admitting: Family Medicine

## 2020-02-17 DIAGNOSIS — R059 Cough, unspecified: Secondary | ICD-10-CM

## 2020-02-17 NOTE — Telephone Encounter (Signed)
Cxr ordered entered

## 2020-02-17 NOTE — Telephone Encounter (Signed)
Please advise 

## 2020-02-17 NOTE — Telephone Encounter (Signed)
Patient states she is not better. Patient still coughing a lot and would like an xray. Please advise

## 2020-02-18 ENCOUNTER — Ambulatory Visit (HOSPITAL_BASED_OUTPATIENT_CLINIC_OR_DEPARTMENT_OTHER)
Admission: RE | Admit: 2020-02-18 | Discharge: 2020-02-18 | Disposition: A | Discharge status: Home / Self Care | Payer: Medicare Other | Source: Ambulatory Visit | Attending: Family Medicine | Admitting: Family Medicine

## 2020-02-18 ENCOUNTER — Encounter: Payer: Self-pay | Admitting: Family Medicine

## 2020-02-18 ENCOUNTER — Other Ambulatory Visit: Payer: Self-pay

## 2020-02-18 ENCOUNTER — Other Ambulatory Visit: Payer: Self-pay | Admitting: Family Medicine

## 2020-02-18 DIAGNOSIS — R059 Cough, unspecified: Secondary | ICD-10-CM | POA: Diagnosis not present

## 2020-02-18 DIAGNOSIS — J189 Pneumonia, unspecified organism: Secondary | ICD-10-CM

## 2020-02-18 MED ORDER — MOXIFLOXACIN HCL 400 MG PO TABS: 400.0000 mg | ORAL_TABLET | Freq: Every day | ORAL | 0 refills | Status: DC

## 2020-02-18 NOTE — Telephone Encounter (Signed)
Spoke w/ Pt- informed x-ray order in. [facility]. Pt verbalized understanding.

## 2020-02-18 NOTE — Telephone Encounter (Signed)
Patient called to go over lab results & meds sent in.

## 2020-02-24 ENCOUNTER — Other Ambulatory Visit: Payer: Self-pay

## 2020-02-24 ENCOUNTER — Ambulatory Visit (INDEPENDENT_AMBULATORY_CARE_PROVIDER_SITE_OTHER): Payer: Medicare Other | Admitting: Family Medicine

## 2020-02-24 ENCOUNTER — Encounter: Payer: Self-pay | Admitting: Family Medicine

## 2020-02-24 VITALS — BP 120/84 | HR 76 | Temp 97.8°F | Resp 18 | Ht 61.0 in | Wt 143.2 lb

## 2020-02-24 DIAGNOSIS — J189 Pneumonia, unspecified organism: Secondary | ICD-10-CM

## 2020-02-24 NOTE — Progress Notes (Signed)
Patient ID: Amanda [name], female    DOB: June 13, 1940  Age: 79 y.o. MRN: [id number]    Subjective:  Subjective  HPI Amanda [name] presents for f/u pneumonia -- she is feeling much better but is tired   Review of Systems  Constitutional: Negative for activity change, appetite change, diaphoresis, fatigue and unexpected weight change.  Eyes: Negative for pain, redness and visual disturbance.  Respiratory: Negative for cough, chest tightness, shortness of breath and wheezing.   Cardiovascular: Negative for chest pain, palpitations and leg swelling.  Endocrine: Negative for cold intolerance, heat intolerance, polydipsia, polyphagia and polyuria.  Genitourinary: Negative for difficulty urinating, dysuria and frequency.  Neurological: Negative for dizziness, light-headedness, numbness and headaches.  Psychiatric/Behavioral: Negative for behavioral problems and dysphoric mood. The patient is not nervous/anxious.     History Past Medical History:  Diagnosis Date  . Anemia   . Arthritis   . Cancer ([place])   . Constipation, chronic   . Essential hypertension   . GERD (gastroesophageal reflux disease)    zantac  . Heart murmur   . History of blood transfusion [place]  . Hyperlipidemia   . Hypertension   . Hyperthyroidism   . Hypothyroidism   . Lymphoproliferative disorder ([place])   . Macular degeneration 2013   Both eyes   . Osteopenia   . Pneumonia   . PONV (postoperative nausea and vomiting)    needs little anesthesia  . Shingles   . Shortness of breath    on exertion  . Spleen enlarged   . SUI (stress urinary incontinence, female)   . Wears glasses     She has a past surgical history that includes Tonsillectomy and adenoidectomy; Cesarean section (5643); Wisdom tooth extraction; Cataract extraction (2009, 2011); Tubal ligation; Dilation and curettage of uterus; Carpal tunnel release (1999); Colonoscopy; Hysteroscopy with D & C (01/07/2012); Lymph node biopsy (Left,  05/26/2018); IR IMAGING GUIDED PORT INSERTION (07/15/2018); ORIF ankle fracture (Left, 12/12/2018); Cesarean section; Tonsillectomy; Cataract extraction, bilateral; ORIF ankle fracture (Left, 12/12/2018); and Breast excisional biopsy (Left, 1980).   Her family history includes Breast cancer (age of onset: 72) in her mother; COPD in her paternal grandfather; Diabetes in her father; Hyperlipidemia in her brother; Hypertension in her father; Kidney failure in her father; Ovarian cancer in her mother; Prostate cancer in her father; Stroke in her maternal grandfather.She reports that she has never smoked. She has never used smokeless tobacco. She reports current alcohol use. She reports that she does not use drugs.  Current Outpatient Medications on File Prior to Visit  Medication Sig Dispense Refill  . acetaminophen (TYLENOL) 650 MG CR tablet Take 650-1,300 mg by mouth every 6 (six) hours as needed for pain.    [name] amLODipine (NORVASC) 5 MG tablet Take 1 tablet (5 mg total) by mouth daily. 90 tablet 0  . aspirin 81 MG chewable tablet Chew 1 tablet (81 mg total) by mouth daily.    [name] buPROPion (WELLBUTRIN XL) 150 MG 24 hr tablet Take 1 tablet (150 mg total) by mouth daily. 90 tablet 0  . chlorpheniramine-HYDROcodone (TUSSIONEX) 10-8 MG/5ML SUER Take 5 mLs by mouth every 12 (twelve) hours as needed for cough. 70 mL 0  . famotidine (PEPCID) 20 MG tablet One at bedtime (Patient taking differently: Take 20 mg by mouth daily as needed for heartburn or indigestion.) 30 tablet 11  . levothyroxine (SYNTHROID) 125 MCG tablet Take 1 tablet (125 mcg total) by mouth daily before breakfast. 90  tablet 1  . moxifloxacin (AVELOX) 400 MG tablet Take 1 tablet (400 mg total) by mouth daily. 7 tablet 0  . Multiple Vitamins-Minerals (EYE VITAMINS PO) Take 1 tablet by mouth daily.    [name]. omeprazole (PRILOSEC) 20 MG capsule TAKE 1 CAPSULE(20 MG) BY MOUTH DAILY 90 capsule 1  . ondansetron (ZOFRAN) 4 MG tablet Take 1 tablet (4 mg total)  by mouth every 8 (eight) hours as needed for nausea or vomiting. 20 tablet 0  . promethazine-dextromethorphan (PROMETHAZINE-DM) 6.25-15 MG/5ML syrup Take 5 mLs by mouth 4 (four) times daily as needed. 118 mL 0  . traZODone (DESYREL) 50 MG tablet Take 1 tablet (50 mg total) by mouth at bedtime as needed for sleep. 30 tablet 1  . azithromycin (ZITHROMAX Z-PAK) 250 MG tablet As directed (Patient not taking: Reported on 02/24/2020) 6 each 0  . predniSONE (DELTASONE) 10 MG tablet TAKE 3 TABLETS PO QD FOR 3 DAYS THEN TAKE 2 TABLETS PO QD FOR 3 DAYS THEN TAKE 1 TABLET PO QD FOR 3 DAYS THEN TAKE 1/2 TAB PO QD FOR 3 DAYS (Patient not taking: Reported on 02/24/2020) 20 tablet 0   Current Facility-Administered Medications on File Prior to Visit  Medication Dose Route Frequency Provider Last Rate Last Admin  . diphenhydrAMINE (BENADRYL) capsule 50 mg  50 mg Oral Once Arthur HolmsZhao, Yan, MD      . heparin lock flush 100 unit/mL  500 Units Intracatheter Once PRN Arthur HolmsZhao, Yan, MD      . sodium chloride flush (NS) 0.9 % injection 10 mL  10 mL Intracatheter PRN Arthur HolmsZhao, Yan, MD         Objective:  Objective  Physical Exam Vitals and nursing note reviewed.  Constitutional:      Appearance: She is well-developed and well-nourished.  HENT:     Head: Normocephalic and atraumatic.  Eyes:     Extraocular Movements: EOM normal.     Conjunctiva/sclera: Conjunctivae normal.  Neck:     Thyroid: No thyromegaly.     Vascular: No carotid bruit or JVD.  Cardiovascular:     Rate and Rhythm: Normal rate and regular rhythm.     Heart sounds: Normal heart sounds. No murmur heard.   Pulmonary:     Effort: Pulmonary effort is normal. No respiratory distress.     Breath sounds: Normal breath sounds. No wheezing or rales.  Chest:     Chest wall: No tenderness.  Musculoskeletal:        General: No edema.     Cervical back: Normal range of motion and neck supple.  Neurological:     Mental Status: She is alert and oriented to  person, place, and time.  Psychiatric:        Mood and Affect: Mood and affect normal.    BP 120/84 (BP Location: Left Arm, Patient Position: Sitting, Cuff Size: Normal)   Pulse 76   Temp 97.8 F (36.6 C) (Oral)   Resp 18   Ht 5\' 1"  (1.549 m)   Wt 143 lb 3.2 oz (65 kg)   SpO2 97%   BMI 27.06 kg/m  Wt Readings from Last 3 Encounters:  02/24/20 143 lb 3.2 oz (65 kg)  02/11/20 145 lb (65.8 kg)  12/07/19 144 lb 12.8 oz (65.7 kg)     Lab Results  Component Value Date   WBC 5.2 11/30/2019   HGB 12.4 11/30/2019   HCT 37.5 11/30/2019   PLT 239 11/30/2019   GLUCOSE 93 12/07/2019   CHOL  149 12/07/2019   TRIG 102 12/07/2019   HDL 45 (L) 12/07/2019   LDLDIRECT 142.8 11/22/2010   LDLCALC 84 12/07/2019   ALT 13 12/07/2019   AST 14 12/07/2019   NA 141 12/07/2019   K 4.1 12/07/2019   CL 104 12/07/2019   CREATININE 0.82 12/07/2019   BUN 15 12/07/2019   CO2 28 12/07/2019   TSH 0.91 12/07/2019   INR 1.0 12/12/2018   HGBA1C 6.1 (H) 11/18/2014    DG Chest 2 View  Result Date: 02/18/2020 CLINICAL DATA:  Cough EXAM: CHEST - 2 VIEW COMPARISON:  05/17/2019 FINDINGS: RIGHT jugular Port-A-Cath with tip projecting over SVC. Normal heart size, mediastinal contours, and pulmonary vascularity. Atherosclerotic calcification aorta. Bronchitic changes with progressive infiltrate at RIGHT lung base consistent with pneumonia superimposed upon a background of bibasilar chronic interstitial disease. Upper lungs clear. No pleural effusion or pneumothorax. Bones demineralized with chronic compression deformity of a vertebra at the thoracolumbar junction. IMPRESSION: Chronic bronchitic and interstitial lung disease changes with superimposed acute infiltrate at RIGHT lung base. Electronically Signed   By: Lavonia Dana M.D.   On: 02/18/2020 11:21     Assessment & Plan:  Plan  I am having Patsye C. Mcpeters maintain her aspirin, famotidine, traZODone, Multiple Vitamins-Minerals (EYE VITAMINS PO), acetaminophen,  amLODipine, ondansetron, omeprazole, levothyroxine, buPROPion, azithromycin, predniSONE, promethazine-dextromethorphan, chlorpheniramine-HYDROcodone, and moxifloxacin.  No orders of the defined types were placed in this encounter.   Problem List Items Addressed This Visit      Unprioritized   Pneumonia of right lower lobe due to infectious organism - Primary    Feeling better  Finished all meds F/u prn          Follow-up: Return in about 6 months (around 08/24/2020), or if symptoms worsen or fail to improve, for hypertension, hyperlipidemia.  [name], DO

## 2020-02-24 NOTE — Assessment & Plan Note (Signed)
Feeling better  Finished all meds F/u prn

## 2020-02-24 NOTE — Patient Instructions (Signed)
Community-Acquired Pneumonia, Adult Pneumonia is an infection of the lungs. It causes swelling in the airways of the lungs. Mucus and fluid may also build up inside the airways. One type of pneumonia can happen while a person is in a hospital. A different type can happen when a person is not in a hospital (community-acquired pneumonia).  What are the causes?  This condition is caused by germs (viruses, bacteria, or fungi). Some types of germs can be passed from one person to another. This can happen when you breathe in droplets from the cough or sneeze of an infected person. What increases the risk? You are more likely to develop this condition if you:  Have a long-term (chronic) disease, such as: ? Chronic obstructive pulmonary disease (COPD). ? Asthma. ? Cystic fibrosis. ? Congestive heart failure. ? Diabetes. ? Kidney disease.  Have HIV.  Have sickle cell disease.  Have had your spleen removed.  Do not take good care of your teeth and mouth (poor dental hygiene).  Have a medical condition that increases the risk of breathing in droplets from your own mouth and nose.  Have a weakened body defense system (immune system).  Are a smoker.  Travel to areas where the germs that cause this illness are common.  Are around certain animals or the places they live. What are the signs or symptoms?  A dry cough.  A wet (productive) cough.  Fever.  Sweating.  Chest pain. This often happens when breathing deeply or coughing.  Fast breathing or trouble breathing.  Shortness of breath.  Shaking chills.  Feeling tired (fatigue).  Muscle aches. How is this treated? Treatment for this condition depends on many things. Most adults can be treated at home. In some cases, treatment must happen in a hospital. Treatment may include:  Medicines given by mouth or through an IV tube.  Being given extra oxygen.  Respiratory therapy. In rare cases, treatment for very bad pneumonia  may include:  Using a machine to help you breathe.  Having a procedure to remove fluid from around your lungs. Follow these instructions at home: Medicines  Take over-the-counter and prescription medicines only as told by your doctor. ? Only take cough medicine if you are losing sleep.  If you were prescribed an antibiotic medicine, take it as told by your doctor. Do not stop taking the antibiotic even if you start to feel better. General instructions   Sleep with your head and neck raised (elevated). You can do this by sleeping in a recliner or by putting a few pillows under your head.  Rest as needed. Get at least 8 hours of sleep each night.  Drink enough water to keep your pee (urine) pale yellow.  Eat a healthy diet that includes plenty of vegetables, fruits, whole grains, low-fat dairy products, and lean protein.  Do not use any products that contain nicotine or tobacco. These include cigarettes, e-cigarettes, and chewing tobacco. If you need help quitting, ask your doctor.  Keep all follow-up visits as told by your doctor. This is important. How is this prevented? A shot (vaccine) can help prevent pneumonia. Shots are often suggested for:  People older than 81 years of age.  People older than 81 years of age who: ? Are having cancer treatment. ? Have long-term (chronic) lung disease. ? Have problems with their body's defense system. You may also prevent pneumonia if you take these actions:  Get the flu (influenza) shot every year.  Go to the dentist as   often as told.  Wash your hands often. If you cannot use soap and water, use hand sanitizer. Contact a doctor if:  You have a fever.  You lose sleep because your cough medicine does not help. Get help right away if:  You are short of breath and it gets worse.  You have more chest pain.  Your sickness gets worse. This is very serious if: ? You are an older adult. ? Your body's defense system is weak.  You  cough up blood. Summary  Pneumonia is an infection of the lungs.  Most adults can be treated at home. Some will need treatment in a hospital.  Drink enough water to keep your pee pale yellow.  Get at least 8 hours of sleep each night. This information is not intended to replace advice given to you by your health care provider. Make sure you discuss any questions you have with your health care provider. Document Revised: 06/10/2018 Document Reviewed: 10/16/2017 Elsevier Patient Education  2020 [employer]  

## 2020-02-28 ENCOUNTER — Other Ambulatory Visit: Payer: Self-pay

## 2020-02-28 ENCOUNTER — Ambulatory Visit
Admission: RE | Admit: 2020-02-28 | Discharge: 2020-02-28 | Disposition: A | Discharge status: Home / Self Care | Payer: Medicare Other | Source: Ambulatory Visit | Attending: Family Medicine | Admitting: Family Medicine

## 2020-02-28 ENCOUNTER — Telehealth: Payer: Self-pay | Admitting: Family Medicine

## 2020-02-28 DIAGNOSIS — E2839 Other primary ovarian failure: Secondary | ICD-10-CM

## 2020-02-28 NOTE — Telephone Encounter (Signed)
[facility] calling in regards to Amanda [name] MRN [id number]. They state patient [facility] at the [facility], if they do it at their office the doctor will  not be able to see if patient osteopenia  is getting better or worse.

## 2020-02-28 NOTE — Telephone Encounter (Signed)
Called pt - she has decided to do at the [facility] for best accuracy- I ordered dexa for her

## 2020-02-29 ENCOUNTER — Inpatient Hospital Stay: Payer: Medicare Other

## 2020-02-29 ENCOUNTER — Telehealth: Payer: Self-pay | Admitting: Hematology & Oncology

## 2020-02-29 ENCOUNTER — Encounter: Payer: Self-pay | Admitting: Hematology & Oncology

## 2020-02-29 ENCOUNTER — Encounter: Payer: Self-pay | Admitting: *Deleted

## 2020-02-29 ENCOUNTER — Inpatient Hospital Stay (HOSPITAL_BASED_OUTPATIENT_CLINIC_OR_DEPARTMENT_OTHER): Payer: Medicare Other | Admitting: Hematology & Oncology

## 2020-02-29 ENCOUNTER — Inpatient Hospital Stay: Payer: Medicare Other | Attending: Hematology & Oncology

## 2020-02-29 ENCOUNTER — Other Ambulatory Visit: Payer: Self-pay

## 2020-02-29 VITALS — BP 108/74 | HR 80 | Temp 97.9°F | Resp 18 | Wt 140.5 lb

## 2020-02-29 DIAGNOSIS — C8330 Diffuse large B-cell lymphoma, unspecified site: Secondary | ICD-10-CM | POA: Insufficient documentation

## 2020-02-29 DIAGNOSIS — C8580 Other specified types of non-Hodgkin lymphoma, unspecified site: Secondary | ICD-10-CM

## 2020-02-29 DIAGNOSIS — Z79899 Other long term (current) drug therapy: Secondary | ICD-10-CM | POA: Insufficient documentation

## 2020-02-29 DIAGNOSIS — Z881 Allergy status to other antibiotic agents status: Secondary | ICD-10-CM | POA: Insufficient documentation

## 2020-02-29 DIAGNOSIS — Z5112 Encounter for antineoplastic immunotherapy: Secondary | ICD-10-CM | POA: Insufficient documentation

## 2020-02-29 DIAGNOSIS — Z888 Allergy status to other drugs, medicaments and biological substances status: Secondary | ICD-10-CM | POA: Diagnosis not present

## 2020-02-29 LAB — CBC WITH DIFFERENTIAL (CANCER CENTER ONLY)
Abs Immature Granulocytes: 0.03 10*3/uL (ref 0.00–0.07)
Basophils Absolute: 0.1 10*3/uL (ref 0.0–0.1)
Basophils Relative: 1 %
Eosinophils Absolute: 0.3 10*3/uL (ref 0.0–0.5)
Eosinophils Relative: 4 %
HCT: 38.7 % (ref 36.0–46.0)
Hemoglobin: 12.4 g/dL (ref 12.0–15.0)
Immature Granulocytes: 0 %
Lymphocytes Relative: 18 %
Lymphs Abs: 1.4 10*3/uL (ref 0.7–4.0)
MCH: 26.9 pg (ref 26.0–34.0)
MCHC: 32 g/dL (ref 30.0–36.0)
MCV: 83.9 fL (ref 80.0–100.0)
Monocytes Absolute: 0.9 10*3/uL (ref 0.1–1.0)
Monocytes Relative: 12 %
Neutro Abs: 5.1 10*3/uL (ref 1.7–7.7)
Neutrophils Relative %: 65 %
Platelet Count: 306 10*3/uL (ref 150–400)
RBC: 4.61 MIL/uL (ref 3.87–5.11)
RDW: 14.3 % (ref 11.5–15.5)
WBC Count: 7.8 10*3/uL (ref 4.0–10.5)
nRBC: 0 % (ref 0.0–0.2)

## 2020-02-29 LAB — CMP (CANCER CENTER ONLY)
ALT: 10 U/L (ref 0–44)
AST: 11 U/L — ABNORMAL LOW (ref 15–41)
Albumin: 4 g/dL (ref 3.5–5.0)
Alkaline Phosphatase: 68 U/L (ref 38–126)
Anion gap: 8 (ref 5–15)
BUN: 20 mg/dL (ref 8–23)
CO2: 29 mmol/L (ref 22–32)
Calcium: 9.4 mg/dL (ref 8.9–10.3)
Chloride: 104 mmol/L (ref 98–111)
Creatinine: 0.96 mg/dL (ref 0.44–1.00)
GFR, Estimated: 59 mL/min — ABNORMAL LOW (ref >60)
Glucose, Bld: 94 mg/dL (ref 70–99)
Potassium: 4 mmol/L (ref 3.5–5.1)
Sodium: 141 mmol/L (ref 135–145)
Total Bilirubin: 0.4 mg/dL (ref 0.3–1.2)
Total Protein: 6.1 g/dL — ABNORMAL LOW (ref 6.5–8.1)

## 2020-02-29 LAB — LACTATE DEHYDROGENASE: LDH: 75 U/L — ABNORMAL LOW (ref 98–192)

## 2020-02-29 MED ORDER — DIPHENHYDRAMINE HCL 25 MG PO CAPS
50.0000 mg | ORAL_CAPSULE | Freq: Once | ORAL | Status: AC
  Administered 2020-02-29: 12:00:00 50 mg via ORAL

## 2020-02-29 MED ORDER — ACETAMINOPHEN 325 MG PO TABS
ORAL_TABLET | ORAL | Status: AC
  Filled 2020-02-29: qty 2

## 2020-02-29 MED ORDER — SODIUM CHLORIDE 0.9 % IV SOLN
375.0000 mg/m2 | Freq: Once | INTRAVENOUS | Status: AC
  Administered 2020-02-29: 13:00:00 600 mg via INTRAVENOUS
  Filled 2020-02-29: qty 10

## 2020-02-29 MED ORDER — ACETAMINOPHEN 325 MG PO TABS
650.0000 mg | ORAL_TABLET | Freq: Once | ORAL | Status: AC
  Administered 2020-02-29: 12:00:00 650 mg via ORAL

## 2020-02-29 MED ORDER — DIPHENHYDRAMINE HCL 25 MG PO CAPS
ORAL_CAPSULE | ORAL | Status: AC
  Filled 2020-02-29: qty 2

## 2020-02-29 NOTE — Patient Instructions (Signed)

## 2020-02-29 NOTE — Telephone Encounter (Signed)
Appointments scheduled updated appointments to be printed by infusion nurses per 12/28 los

## 2020-02-29 NOTE — Progress Notes (Signed)
Hematology and Oncology Follow Up Visit  Amanda [name] NO:[id number] 1940-10-17 79 y.o. 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  Overall, she is doing quite nicely.  She had a nice holiday with Thanksgiving and Christmas.  She was not able to visit family because of Covid.  She has had no problems with fever.  She had no cough or shortness of breath.  She has had no change in bowel or bladder habits.  There has been no problems with headache.  She has had no rashes.  There has been no leg swelling.  Her appetite has been quite good.  She has had no issues with mouth sores.  Overall, her performance status is ECOG 1.    Medications:  Current Outpatient Medications:  .  amLODipine (NORVASC) 5 MG tablet, Take 1 tablet (5 mg total) by mouth daily., Disp: 90 tablet, Rfl: 0 .  aspirin 81 MG chewable tablet, Chew 1 tablet (81 mg total) by mouth daily., Disp:  , Rfl:  .  buPROPion (WELLBUTRIN XL) 150 MG 24 hr tablet, Take 1 tablet (150 mg total) by mouth daily., Disp: 90 tablet, Rfl: 0 .  famotidine (PEPCID) 20 MG tablet, One at bedtime (Patient taking differently: Take 20 mg by mouth daily as needed for heartburn or indigestion.), Disp: 30 tablet, Rfl: 11 .  levothyroxine (SYNTHROID) 125 MCG tablet, Take 1 tablet (125 mcg total) by mouth daily before breakfast., Disp: 90 tablet, Rfl: 1 .  Multiple Vitamins-Minerals (EYE VITAMINS PO), Take 1 tablet by mouth daily., Disp: , Rfl:  .  omeprazole (PRILOSEC) 20 MG capsule, TAKE 1 CAPSULE(20 MG) BY MOUTH DAILY, Disp: 90 capsule, Rfl: 1 .  acetaminophen (TYLENOL) 650 MG CR tablet, Take 650-1,300 mg by mouth every 6 (six) hours as needed for pain. (Patient not taking: Reported on 02/29/2020), Disp: , Rfl:  .  azithromycin  (ZITHROMAX Z-PAK) 250 MG tablet, As directed, Disp: 6 each, Rfl: 0 .  chlorpheniramine-HYDROcodone (TUSSIONEX) 10-8 MG/5ML SUER, Take 5 mLs by mouth every 12 (twelve) hours as needed for cough., Disp: 70 mL, Rfl: 0 .  moxifloxacin (AVELOX) 400 MG tablet, Take 1 tablet (400 mg total) by mouth daily., Disp: 7 tablet, Rfl: 0 .  ondansetron (ZOFRAN) 4 MG tablet, Take 1 tablet (4 mg total) by mouth every 8 (eight) hours as needed for nausea or vomiting. (Patient not taking: Reported on 02/29/2020), Disp: 20 tablet, Rfl: 0 .  predniSONE (DELTASONE) 10 MG tablet, TAKE 3 TABLETS PO QD FOR 3 DAYS THEN TAKE 2 TABLETS PO QD FOR 3 DAYS THEN TAKE 1 TABLET PO QD FOR 3 DAYS THEN TAKE 1/2 TAB PO QD FOR 3 DAYS, Disp: 20 tablet, Rfl: 0 .  promethazine-dextromethorphan (PROMETHAZINE-DM) 6.25-15 MG/5ML syrup, Take 5 mLs by mouth 4 (four) times daily as needed. (Patient not taking: Reported on 02/29/2020), Disp: 118 mL, Rfl: 0 .  traZODone (DESYREL) 50 MG tablet, Take 1 tablet (50 mg total) by mouth at bedtime as needed for sleep., Disp: 30 tablet, Rfl: 1 No current facility-administered medications for this visit.  Facility-Administered Medications Ordered in Other Visits:  .  diphenhydrAMINE (BENADRYL) capsule 50 mg, 50 mg, Oral, Once, Tish Men, MD .  heparin lock flush 100  unit/mL, 500 Units, Intracatheter, Once PRN, Tish Men, MD .  sodium chloride flush (NS) 0.9 % injection 10 mL, 10 mL, Intracatheter, PRN, Tish Men, MD  Allergies:  Allergies  Allergen Reactions  . Phenergan [Promethazine Hcl] Other (See Comments)    Jerking/agitation  . Preservision Areds 2 [Multiple Vitamins-Minerals] Nausea Only  . Levofloxacin Nausea And Vomiting  . Pravastatin Nausea And Vomiting    Past Medical History, Surgical history, Social history, and Family History were reviewed and updated.  Review of Systems: Review of Systems  Constitutional: Negative.   HENT:  Negative.   Eyes: Negative.   Respiratory: Negative.    Cardiovascular: Negative.   Gastrointestinal: Negative.   Endocrine: Negative.   Genitourinary: Negative.    Musculoskeletal: Negative.   Skin: Negative.   Neurological: Negative.   Hematological: Negative.   Psychiatric/Behavioral: Negative.     Physical Exam:  weight is 140 lb 8 oz (63.7 kg). Her oral temperature is 97.9 F (36.6 C). Her blood pressure is 108/74 and her pulse is 80. Her respiration is 18 and oxygen saturation is 98%.   Wt Readings from Last 3 Encounters:  02/29/20 140 lb 8 oz (63.7 kg)  02/29/20 140 lb 1.3 oz (63.5 kg)  02/24/20 143 lb 3.2 oz (65 kg)    Physical Exam Vitals reviewed.  HENT:     Head: Normocephalic and atraumatic.  Eyes:     Pupils: Pupils are equal, round, and reactive to light.  Cardiovascular:     Rate and Rhythm: Normal rate and regular rhythm.     Heart sounds: Normal heart sounds.  Pulmonary:     Effort: Pulmonary effort is normal.     Breath sounds: Normal breath sounds.  Abdominal:     General: Bowel sounds are normal.     Palpations: Abdomen is soft.  Musculoskeletal:        General: No tenderness or deformity. Normal range of motion.     Cervical back: Normal range of motion.  Lymphadenopathy:     Cervical: No cervical adenopathy.  Skin:    General: Skin is warm and dry.     Findings: No erythema or rash.  Neurological:     Mental Status: She is alert and oriented to person, place, and time.  Psychiatric:        Behavior: Behavior normal.        Thought Content: Thought content normal.        Judgment: Judgment normal.      Lab Results  Component Value Date   WBC 7.8 02/29/2020   HGB 12.4 02/29/2020   HCT 38.7 02/29/2020   MCV 83.9 02/29/2020   PLT 306 02/29/2020     Chemistry      Component Value Date/Time   NA 141 02/29/2020 1030   NA 146 (H) 05/26/2019 1108   K 4.0 02/29/2020 1030   CL 104 02/29/2020 1030   CO2 29 02/29/2020 1030   BUN 20 02/29/2020 1030   BUN 17 05/26/2019 1108   CREATININE 0.96  02/29/2020 1030   CREATININE 0.82 12/07/2019 1429   GLU 91 06/23/2018 0000      Component Value Date/Time   CALCIUM 9.4 02/29/2020 1030   CALCIUM 12.3 (H) 06/16/2018 1639   ALKPHOS 68 02/29/2020 1030   AST 11 (L) 02/29/2020 1030   ALT 10 02/29/2020 1030   BILITOT 0.4 02/29/2020 1030      Impression and Plan: Ms. Rostron is a very charming 79 year old white female.  She has  a transformed marginal zone lymphoma.  I think she is doing fantastic.  I cannot find anything on exam that would suggest recurrence of her disease.  She will get her Rituxan today.  She is doing well with the Rituxan every 3 months.  We will have her come back in 3 months.  This way we will get her through the winter.  I am just happy that she is doing so well.  Hopefully she will be able to see her family next year.    Josph Macho, MD 12/28/202111:02 AM

## 2020-02-29 NOTE — Patient Instructions (Signed)
Rituximab; Hyaluronidase injection What is this medicine? RITUXIMAB; HYALURONIDASE (ri TUX i mab / hye al ur ON i dase) is used to treat non-Hodgkin lymphoma and chronic lymphocytic leukemia. Rituximab is a monoclonal antibody. Hyaluronidase is used to improve the effects of rituximab. This medicine may be used for other purposes; ask your health care provider or pharmacist if you have questions. COMMON BRAND NAME(S): Rituxan Hycela What should I tell my health care provider before I take this medicine? They need to know if you have any of these conditions:  heart disease  infection (especially a virus infection such as hepatitis B, chickenpox, cold sores, or herpes)  immune system problems  irregular heartbeat  kidney disease  lung or breathing disease, like asthma  recently received or scheduled to receive a vaccine  an unusual or allergic reaction to rituximab, rituximab/hyaluronidase, mouse proteins, other medicines, foods, dyes, or preservatives  pregnant or trying to get pregnant  breast-feeding How should I use this medicine? This medicine is for injection under the skin. It is given by a health care professional in a hospital or clinic setting. A special MedGuide will be given to you before each treatment. Be sure to read this information carefully each time. Talk to your pediatrician regarding the use of this medicine in children. Special care may be needed. Overdosage: If you think you have taken too much of this medicine contact a poison control center or emergency room at once. NOTE: This medicine is only for you. Do not share this medicine with others. What if I miss a dose? It is important not to miss your dose. Call your doctor or health care professional if you are unable to keep an appointment. What may interact with this medicine? This medicine may interact with the following medications:  cisplatin  live virus vaccines This list may not describe all  possible interactions. Give your health care provider a list of all the medicines, herbs, non-prescription drugs, or dietary supplements you use. Also tell them if you smoke, drink alcohol, or use illegal drugs. Some items may interact with your medicine. What should I watch for while using this medicine? Your condition will be monitored carefully while you are receiving this medicine. You may need blood work done while you are taking this medicine. This medicine can cause serious allergic reactions. To reduce your risk you may need to take medicine before treatment with this medicine. Take your medicine as directed. In some patients, this medicine may cause a serious brain infection that may cause death. If you have any problems seeing, thinking, speaking, walking, or standing, tell your doctor right away. If you cannot reach your doctor, urgently seek other source of medical care. Call your doctor or health care professional for advice if you get a fever, chills or sore throat, or other symptoms of a cold or flu. Do not treat yourself. This drug decreases your body's ability to fight infections. Try to avoid being around people who are sick. Do not become pregnant while taking this medicine or for 12 months after stopping it. Women should inform their doctor if they wish to become pregnant or think they might be pregnant. There is a potential for serious side effects to an unborn child. Talk to your health care professional or pharmacist for more information. Do not breast-feed an infant while taking this medicine or for at least 6 months after stopping it. What side effects may I notice from receiving this medicine? Side effects that you should report   to your doctor or health care professional as soon as possible:  allergic reactions like skin rash, itching or hives; swelling of the face, lips, or tongue  breathing problems  chest pain  changes in vision  diarrhea  dizziness  headache with  fever, neck stiffness, sensitivity to light, nausea, or confusion  fast, irregular heartbeat  loss of memory  low blood counts - this medicine may decrease the number of white blood cells, red blood cells and platelets. You may be at increased risk for infections and bleeding.  mouth sores  problems with balance, talking, or walking  redness, blistering, peeling or loosening of the skin, including inside the mouth  signs of infection - fever or chills, cough, sore throat, pain or difficulty passing urine  signs and symptoms of kidney injury like trouble passing urine or change in the amount of urine  signs and symptoms of liver injury like dark yellow or brown urine; general ill feeling or flu-like symptoms; light-colored stools; loss of appetite; nausea; right upper belly pain; unusually weak or tired; yellowing of the eyes or skin  stomach pain  swelling of the ankles, feet, hands  unusual bleeding or bruising  vomiting Side effects that usually do not require medical attention (report these to your doctor or health care professional if they continue or are bothersome):  headache  joint pain  muscle cramps or muscle pain  nausea  pain, redness, or irritation at site where injected  tiredness This list may not describe all possible side effects. Call your doctor for medical advice about side effects. You may report side effects to FDA at 1-800-FDA-1088. Where should I keep my medicine? This drug is given in a hospital or clinic and will not be stored at home. NOTE: This sheet is a summary. It may not cover all possible information. If you have questions about this medicine, talk to your doctor, pharmacist, or health care provider.  2020 Elsevier/Gold Standard (2017-01-31 13:12:25)  

## 2020-02-29 NOTE — Progress Notes (Signed)
Oncology Nurse Navigator Documentation  Oncology Nurse Navigator Flowsheets 02/29/2020  Abnormal Finding Date -  Confirmed Diagnosis Date -  Diagnosis Status -  Planned Course of Treatment -  Phase of Treatment -  Chemotherapy Actual Start Date: -  Chemotherapy Actual End Date: -  Navigator Follow Up Date: -  Navigator Follow Up Reason: -  Navigation Complete Date: 02/29/2020  Post Navigation: Continue to Follow Patient? No  Reason Not Navigating Patient: Patient On Maintenance Chemotherapy  Navigator Location [facility]  Referral Date to RadOnc/MedOnc -  Navigator Encounter Type Appt/Treatment Plan Review;Treatment  Telephone -  Treatment Initiated Date -  Patient Visit Type MedOnc  Treatment Phase Active Tx  Barriers/Navigation Needs No Barriers At This Time  Education -  Interventions Psycho-Social Support  Acuity Level 1-No Barriers  Referrals -  Coordination of Care -  Education Method -  Time Spent with Patient 15

## 2020-03-01 ENCOUNTER — Other Ambulatory Visit: Payer: Self-pay | Admitting: Internal Medicine

## 2020-03-01 MED ORDER — FAMOTIDINE 20 MG PO TABS: 20.0000 mg | ORAL_TABLET | Freq: Every day | ORAL | 3 refills | Status: DC | PRN

## 2020-03-06 ENCOUNTER — Other Ambulatory Visit (HOSPITAL_BASED_OUTPATIENT_CLINIC_OR_DEPARTMENT_OTHER): Payer: Medicare Other

## 2020-03-09 ENCOUNTER — Ambulatory Visit: Payer: Medicare Other | Admitting: Pharmacist

## 2020-03-09 DIAGNOSIS — E2839 Other primary ovarian failure: Secondary | ICD-10-CM

## 2020-03-09 DIAGNOSIS — I10 Essential (primary) hypertension: Secondary | ICD-10-CM
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
## 2020-03-09 NOTE — Patient Instructions (Addendum)
Visit Information  Goals Addressed            This Visit's Progress   . Chronic Care Management Pharmacy Care Plan       CARE PLAN ENTRY (see longitudinal plan of care for additional care plan information)  Current Barriers:  . Chronic Disease Management support, education, and care coordination needs related to Hypertension, Hyperlipidemia, Pre-Diabetes, Hypothyroidism, Depression, GERD, Osteopenia   Hypertension BP Readings from Last 3 Encounters:  02/29/20 108/74  02/29/20 108/74  02/24/20 120/84   . Pharmacist Clinical Goal(s): o Over the next 90 days, patient will work with PharmD and providers to maintain BP goal <140/90 . Current regimen:  o Amlodipine 5mg  daily at breakfast . Interventions: o Requested patient to check her BP 1-2 times per week and record . Patient self care activities - Over the next 90 days, patient will: o Check BP 1-2 times per week, document, and provide at future appointments o Ensure daily salt intake < 2300 mg/Arnetta Odeh  Hyperlipidemia Lab Results  Component Value Date/Time   LDLCALC 84 12/07/2019 02:29 PM   LDLDIRECT 142.8 11/22/2010 09:04 AM   . Pharmacist Clinical Goal(s): o Over the next 90 days, patient will work with PharmD and providers to maintain LDL goal < 100 . Current regimen:  o Diet and exercise management   . Interventions: o Discussed diet and exercise . Patient self care activities - Over the next 90 days, patient will: o Maintain LDL less than 100  Pre-Diabetes Lab Results  Component Value Date/Time   HGBA1C 6.1 (H) 11/18/2014 12:57 AM   HGBA1C 5.8 11/29/2011 03:51 PM   . Pharmacist Clinical Goal(s): o Over the next 90 days, patient will work with PharmD and providers to maintain A1c goal <6.5% . Current regimen:  o Diet and exercise management   . Interventions: o Discussed diet and exercise o Consider repeat a1c at next office visit . Patient self care activities - Over the next 90 days, patient  will: o Maintain a1c <6.5%  GERD . Pharmacist Clinical Goal(s) o Over the next 90 days, patient will work with PharmD and providers to reduce symptoms associated with GERD and decrease polypharmacy . Current regimen:  . Famotidine 20mg  as needed  . Omeprazole 20mg  daily . Interventions: o Discussed the risk/benefit of continuation vs discontinuation of long term PPI use  o Discussed omeprazole tapering plan - Week 1: Take every other Diondre Pulis (can do this for 1-2 weeks noting you get symptoms next Kindel Rochefort w/o omeperazole) - Week 2: Take every 3rd Ellyssa Zagal (2 days between doses) - Week 3: Take every 4th Banessa Mao/Twice a week (3 days between doses) - Week: 4: Take once a week - Week 5: Discontinue . Patient self care activities - Over the next 90 days, patient will: o Consider tapering omeprazole  Osteopenia . Pharmacist Clinical Goal(s) o Over the next 180 days, patient will work with PharmD and providers to reduce risk of fracture associated with osteopenia . Current regimen:  o None . Interventions: o Discussed repeat bone density scan o Discussed intake of 1200mg  of calcium daily through diet and/or supplementation o Discussed intake of [phone] units of vitamin D through supplementation  . Patient self care activities - Over the next 180 days, patient will: o Consider repeat DEXA Scan (scheduled for 03/10/20) o Consider intake of 1200mg  of calcium daily through diet and/or supplementation o Consider intake of [phone] units of vitamin D through supplementation  Health Maintenance  . Pharmacist Clinical Goal(s) o  Over the next 90 days, patient will work with PharmD and providers to complete health maintenance screenings/vaccinations . Interventions: o Discussed immunization record o Confirmed [facility] DOES NOT have the date listed for the Shingrix vaccine o Discussed Shingrix vaccine . Patient self care activities - Over the next 90 days, patient will: o Consider completing Shingrix vaccine  series   Medication management . Pharmacist Clinical Goal(s): o Over the next 90 days, patient will work with PharmD and providers to maintain optimal medication adherence . Current pharmacy: [facility] . Interventions o Comprehensive medication review performed. o Continue current medication management strategy . Patient self care activities - Over the next 90 days, patient will: o Focus on medication adherence by filling and taking medications appropriately  o Take medications as prescribed o Report any questions or concerns to PharmD and/or provider(s)  Please see past updates related to this goal by clicking on the "Past Updates" button in the selected goal         The patient verbalized understanding of instructions, educational materials, and care plan provided today and agreed to receive a mailed copy of patient instructions, educational materials, and care plan.   Telephone follow up appointment with pharmacy team member scheduled for: 06/07/2020  Dannielle Karvonen Song Myre, [facility]

## 2020-03-10 ENCOUNTER — Other Ambulatory Visit: Payer: Self-pay

## 2020-03-10 ENCOUNTER — Other Ambulatory Visit (HOSPITAL_BASED_OUTPATIENT_CLINIC_OR_DEPARTMENT_OTHER): Payer: Self-pay | Admitting: Internal Medicine

## 2020-03-10 ENCOUNTER — Ambulatory Visit: Payer: Medicare Other

## 2020-03-10 ENCOUNTER — Ambulatory Visit
Admission: RE | Admit: 2020-03-10 | Discharge: 2020-03-10 | Disposition: A | Discharge status: Home / Self Care | Payer: Medicare Other | Source: Ambulatory Visit | Attending: Family Medicine | Admitting: Family Medicine

## 2020-03-10 DIAGNOSIS — Z8739 Personal history of other diseases of the musculoskeletal system and connective tissue: Secondary | ICD-10-CM | POA: Diagnosis not present

## 2020-03-10 DIAGNOSIS — Z23 Encounter for immunization: Secondary | ICD-10-CM | POA: Diagnosis not present

## 2020-03-10 DIAGNOSIS — Z87828 Personal history of other (healed) physical injury and trauma: Secondary | ICD-10-CM | POA: Diagnosis not present

## 2020-03-10 DIAGNOSIS — Z780 Asymptomatic menopausal state: Secondary | ICD-10-CM | POA: Diagnosis not present

## 2020-03-10 DIAGNOSIS — E2839 Other primary ovarian failure: Secondary | ICD-10-CM | POA: Insufficient documentation

## 2020-03-10 DIAGNOSIS — E039 Hypothyroidism, unspecified: Secondary | ICD-10-CM | POA: Diagnosis not present

## 2020-03-10 NOTE — Progress Notes (Signed)
   Covid-19 Vaccination Clinic  Name:  Amanda [name]    MRN: [id number] DOB: 02-23-1941  03/10/2020  [name] was observed post Covid-19 immunization for 15 minutes without incident. She was provided with Vaccine Information Sheet and instruction to access the V-Safe system.   [name] was instructed to call 911 with any severe reactions post vaccine: [name] Difficulty breathing  . Swelling of face and throat  . A fast heartbeat  . A bad rash all over body  . Dizziness and weakness   Immunizations Administered    Name Date Dose VIS Date Route   Pfizer COVID-19 Vaccine 03/10/2020  1:06 PM 0.3 mL 12/22/2019 Intramuscular   Manufacturer: [place]   Lot: [id number]   Helen: [id number]

## 2020-03-13 MED FILL — PFIZER-BIONTECH COVID-19 VA: 30 | 21 days supply | Qty: 0 | Fill #0

## 2020-03-15 ENCOUNTER — Encounter: Payer: Self-pay | Admitting: Family Medicine

## 2020-03-15 NOTE — Telephone Encounter (Signed)
Old fractures cause degenerative changes ----arthritis

## 2020-03-15 NOTE — Telephone Encounter (Signed)
She has severe osteoporosis--- but I have sent a message to dr Marin Olp to look at it  Most tx contraindicated because or her lymphoma tx

## 2020-03-21 ENCOUNTER — Encounter: Payer: Self-pay | Admitting: *Deleted

## 2020-03-27 DIAGNOSIS — S134XXA Sprain of ligaments of cervical spine, initial encounter: Secondary | ICD-10-CM | POA: Diagnosis not present

## 2020-03-27 DIAGNOSIS — M5459 Other low back pain: Secondary | ICD-10-CM | POA: Diagnosis not present

## 2020-03-28 ENCOUNTER — Other Ambulatory Visit: Payer: Self-pay | Admitting: Family Medicine

## 2020-03-31 ENCOUNTER — Inpatient Hospital Stay (HOSPITAL_COMMUNITY)
Admission: EM | Admit: 2020-03-31 | Discharge: 2020-04-08 | DRG: 551 | Disposition: A | Discharge status: Rehabilitation Facility | Payer: Medicare Other | Attending: General Surgery | Admitting: General Surgery

## 2020-03-31 ENCOUNTER — Inpatient Hospital Stay (HOSPITAL_COMMUNITY): Payer: Medicare Other

## 2020-03-31 ENCOUNTER — Emergency Department (HOSPITAL_COMMUNITY): Payer: Medicare Other

## 2020-03-31 DIAGNOSIS — Z0189 Encounter for other specified special examinations: Secondary | ICD-10-CM

## 2020-03-31 DIAGNOSIS — S42309A Unspecified fracture of shaft of humerus, unspecified arm, initial encounter for closed fracture: Secondary | ICD-10-CM

## 2020-03-31 DIAGNOSIS — J189 Pneumonia, unspecified organism: Secondary | ICD-10-CM

## 2020-03-31 DIAGNOSIS — W19XXXA Unspecified fall, initial encounter: Secondary | ICD-10-CM

## 2020-03-31 DIAGNOSIS — S42291A Other displaced fracture of upper end of right humerus, initial encounter for closed fracture: Secondary | ICD-10-CM

## 2020-03-31 DIAGNOSIS — S2249XA Multiple fractures of ribs, unspecified side, initial encounter for closed fracture: Secondary | ICD-10-CM

## 2020-03-31 DIAGNOSIS — W19XXXD Unspecified fall, subsequent encounter: Secondary | ICD-10-CM

## 2020-03-31 DIAGNOSIS — S129XXA Fracture of neck, unspecified, initial encounter: Secondary | ICD-10-CM | POA: Diagnosis present

## 2020-03-31 DIAGNOSIS — J9690 Respiratory failure, unspecified, unspecified whether with hypoxia or hypercapnia: Secondary | ICD-10-CM

## 2020-03-31 DIAGNOSIS — S0993XA Unspecified injury of face, initial encounter: Secondary | ICD-10-CM | POA: Diagnosis not present

## 2020-03-31 DIAGNOSIS — R0603 Acute respiratory distress: Secondary | ICD-10-CM | POA: Diagnosis not present

## 2020-03-31 DIAGNOSIS — J9600 Acute respiratory failure, unspecified whether with hypoxia or hypercapnia: Secondary | ICD-10-CM | POA: Diagnosis not present

## 2020-03-31 DIAGNOSIS — S069X9A Unspecified intracranial injury with loss of consciousness of unspecified duration, initial encounter: Secondary | ICD-10-CM | POA: Diagnosis present

## 2020-03-31 DIAGNOSIS — H748X3 Other specified disorders of middle ear and mastoid, bilateral: Secondary | ICD-10-CM | POA: Diagnosis not present

## 2020-03-31 DIAGNOSIS — S069X2S Unspecified intracranial injury with loss of consciousness of 31 minutes to 59 minutes, sequela: Secondary | ICD-10-CM | POA: Diagnosis not present

## 2020-03-31 DIAGNOSIS — R609 Edema, unspecified: Secondary | ICD-10-CM | POA: Diagnosis present

## 2020-03-31 DIAGNOSIS — S12101A Unspecified nondisplaced fracture of second cervical vertebra, initial encounter for closed fracture: Secondary | ICD-10-CM | POA: Diagnosis not present

## 2020-03-31 DIAGNOSIS — I959 Hypotension, unspecified: Secondary | ICD-10-CM | POA: Diagnosis not present

## 2020-03-31 DIAGNOSIS — S12400A Unspecified displaced fracture of fifth cervical vertebra, initial encounter for closed fracture: Secondary | ICD-10-CM | POA: Diagnosis not present

## 2020-03-31 DIAGNOSIS — I6623 Occlusion and stenosis of bilateral posterior cerebral arteries: Secondary | ICD-10-CM | POA: Diagnosis not present

## 2020-03-31 DIAGNOSIS — H3530 Unspecified macular degeneration: Secondary | ICD-10-CM | POA: Diagnosis present

## 2020-03-31 DIAGNOSIS — S069X1S Unspecified intracranial injury with loss of consciousness of 30 minutes or less, sequela: Secondary | ICD-10-CM | POA: Diagnosis not present

## 2020-03-31 DIAGNOSIS — Z20822 Contact with and (suspected) exposure to covid-19: Secondary | ICD-10-CM | POA: Diagnosis present

## 2020-03-31 DIAGNOSIS — Z79899 Other long term (current) drug therapy: Secondary | ICD-10-CM | POA: Diagnosis not present

## 2020-03-31 DIAGNOSIS — S06360A Traumatic hemorrhage of cerebrum, unspecified, without loss of consciousness, initial encounter: Secondary | ICD-10-CM | POA: Diagnosis not present

## 2020-03-31 DIAGNOSIS — E785 Hyperlipidemia, unspecified: Secondary | ICD-10-CM | POA: Diagnosis present

## 2020-03-31 DIAGNOSIS — Z8701 Personal history of pneumonia (recurrent): Secondary | ICD-10-CM

## 2020-03-31 DIAGNOSIS — B965 Pseudomonas (aeruginosa) (mallei) (pseudomallei) as the cause of diseases classified elsewhere: Secondary | ICD-10-CM | POA: Diagnosis not present

## 2020-03-31 DIAGNOSIS — N393 Stress incontinence (female) (male): Secondary | ICD-10-CM | POA: Diagnosis not present

## 2020-03-31 DIAGNOSIS — I10 Essential (primary) hypertension: Secondary | ICD-10-CM | POA: Diagnosis not present

## 2020-03-31 DIAGNOSIS — E039 Hypothyroidism, unspecified: Secondary | ICD-10-CM | POA: Diagnosis present

## 2020-03-31 DIAGNOSIS — G8191 Hemiplegia, unspecified affecting right dominant side: Secondary | ICD-10-CM | POA: Diagnosis not present

## 2020-03-31 DIAGNOSIS — J9811 Atelectasis: Secondary | ICD-10-CM | POA: Diagnosis not present

## 2020-03-31 DIAGNOSIS — R000 Tachycardia, unspecified: Secondary | ICD-10-CM | POA: Diagnosis not present

## 2020-03-31 DIAGNOSIS — Z043 Encounter for examination and observation following other accident: Secondary | ICD-10-CM | POA: Diagnosis not present

## 2020-03-31 DIAGNOSIS — S0081XA Abrasion of other part of head, initial encounter: Secondary | ICD-10-CM | POA: Diagnosis present

## 2020-03-31 DIAGNOSIS — G8929 Other chronic pain: Secondary | ICD-10-CM | POA: Diagnosis not present

## 2020-03-31 DIAGNOSIS — S2242XA Multiple fractures of ribs, left side, initial encounter for closed fracture: Secondary | ICD-10-CM | POA: Diagnosis not present

## 2020-03-31 DIAGNOSIS — W1789XA Other fall from one level to another, initial encounter: Secondary | ICD-10-CM | POA: Diagnosis present

## 2020-03-31 DIAGNOSIS — M549 Dorsalgia, unspecified: Secondary | ICD-10-CM | POA: Diagnosis not present

## 2020-03-31 DIAGNOSIS — E876 Hypokalemia: Secondary | ICD-10-CM | POA: Diagnosis not present

## 2020-03-31 DIAGNOSIS — N179 Acute kidney failure, unspecified: Secondary | ICD-10-CM | POA: Diagnosis present

## 2020-03-31 DIAGNOSIS — Z4682 Encounter for fitting and adjustment of non-vascular catheter: Secondary | ICD-10-CM | POA: Diagnosis not present

## 2020-03-31 DIAGNOSIS — S12300A Unspecified displaced fracture of fourth cervical vertebra, initial encounter for closed fracture: Secondary | ICD-10-CM | POA: Diagnosis not present

## 2020-03-31 DIAGNOSIS — J9601 Acute respiratory failure with hypoxia: Secondary | ICD-10-CM | POA: Diagnosis present

## 2020-03-31 DIAGNOSIS — G894 Chronic pain syndrome: Secondary | ICD-10-CM | POA: Diagnosis not present

## 2020-03-31 DIAGNOSIS — I672 Cerebral atherosclerosis: Secondary | ICD-10-CM | POA: Diagnosis not present

## 2020-03-31 DIAGNOSIS — S069X0D Unspecified intracranial injury without loss of consciousness, subsequent encounter: Secondary | ICD-10-CM | POA: Diagnosis not present

## 2020-03-31 DIAGNOSIS — S2242XD Multiple fractures of ribs, left side, subsequent encounter for fracture with routine healing: Secondary | ICD-10-CM | POA: Diagnosis not present

## 2020-03-31 DIAGNOSIS — S12200D Unspecified displaced fracture of third cervical vertebra, subsequent encounter for fracture with routine healing: Secondary | ICD-10-CM | POA: Diagnosis not present

## 2020-03-31 DIAGNOSIS — S7010XA Contusion of unspecified thigh, initial encounter: Secondary | ICD-10-CM | POA: Diagnosis present

## 2020-03-31 DIAGNOSIS — J40 Bronchitis, not specified as acute or chronic: Secondary | ICD-10-CM | POA: Diagnosis not present

## 2020-03-31 DIAGNOSIS — C8580 Other specified types of non-Hodgkin lymphoma, unspecified site: Secondary | ICD-10-CM | POA: Diagnosis not present

## 2020-03-31 DIAGNOSIS — S42201A Unspecified fracture of upper end of right humerus, initial encounter for closed fracture: Secondary | ICD-10-CM | POA: Diagnosis not present

## 2020-03-31 DIAGNOSIS — R600 Localized edema: Secondary | ICD-10-CM | POA: Diagnosis not present

## 2020-03-31 DIAGNOSIS — C8590 Non-Hodgkin lymphoma, unspecified, unspecified site: Secondary | ICD-10-CM | POA: Diagnosis not present

## 2020-03-31 DIAGNOSIS — Z881 Allergy status to other antibiotic agents status: Secondary | ICD-10-CM | POA: Diagnosis not present

## 2020-03-31 DIAGNOSIS — S12300D Unspecified displaced fracture of fourth cervical vertebra, subsequent encounter for fracture with routine healing: Secondary | ICD-10-CM | POA: Diagnosis not present

## 2020-03-31 DIAGNOSIS — R4020 Unspecified coma: Secondary | ICD-10-CM | POA: Diagnosis not present

## 2020-03-31 DIAGNOSIS — D62 Acute posthemorrhagic anemia: Secondary | ICD-10-CM | POA: Diagnosis not present

## 2020-03-31 DIAGNOSIS — Z79890 Hormone replacement therapy (postmenopausal): Secondary | ICD-10-CM | POA: Diagnosis not present

## 2020-03-31 DIAGNOSIS — K219 Gastro-esophageal reflux disease without esophagitis: Secondary | ICD-10-CM | POA: Diagnosis not present

## 2020-03-31 DIAGNOSIS — K5900 Constipation, unspecified: Secondary | ICD-10-CM | POA: Diagnosis not present

## 2020-03-31 DIAGNOSIS — T07XXXA Unspecified multiple injuries, initial encounter: Secondary | ICD-10-CM | POA: Diagnosis not present

## 2020-03-31 DIAGNOSIS — S2241XA Multiple fractures of ribs, right side, initial encounter for closed fracture: Secondary | ICD-10-CM | POA: Diagnosis not present

## 2020-03-31 DIAGNOSIS — R0689 Other abnormalities of breathing: Secondary | ICD-10-CM | POA: Diagnosis not present

## 2020-03-31 DIAGNOSIS — Z9851 Tubal ligation status: Secondary | ICD-10-CM

## 2020-03-31 DIAGNOSIS — S299XXA Unspecified injury of thorax, initial encounter: Secondary | ICD-10-CM | POA: Diagnosis not present

## 2020-03-31 DIAGNOSIS — I6523 Occlusion and stenosis of bilateral carotid arteries: Secondary | ICD-10-CM | POA: Diagnosis not present

## 2020-03-31 DIAGNOSIS — S12301A Unspecified nondisplaced fracture of fourth cervical vertebra, initial encounter for closed fracture: Secondary | ICD-10-CM | POA: Diagnosis not present

## 2020-03-31 DIAGNOSIS — J90 Pleural effusion, not elsewhere classified: Secondary | ICD-10-CM | POA: Diagnosis not present

## 2020-03-31 DIAGNOSIS — S12390A Other displaced fracture of fourth cervical vertebra, initial encounter for closed fracture: Secondary | ICD-10-CM | POA: Diagnosis not present

## 2020-03-31 DIAGNOSIS — Z7982 Long term (current) use of aspirin: Secondary | ICD-10-CM

## 2020-03-31 DIAGNOSIS — S42301A Unspecified fracture of shaft of humerus, right arm, initial encounter for closed fracture: Secondary | ICD-10-CM | POA: Diagnosis not present

## 2020-03-31 DIAGNOSIS — S42201D Unspecified fracture of upper end of right humerus, subsequent encounter for fracture with routine healing: Secondary | ICD-10-CM | POA: Diagnosis not present

## 2020-03-31 DIAGNOSIS — S301XXA Contusion of abdominal wall, initial encounter: Secondary | ICD-10-CM | POA: Diagnosis not present

## 2020-03-31 DIAGNOSIS — S069X0A Unspecified intracranial injury without loss of consciousness, initial encounter: Secondary | ICD-10-CM | POA: Diagnosis not present

## 2020-03-31 DIAGNOSIS — W1789XD Other fall from one level to another, subsequent encounter: Secondary | ICD-10-CM | POA: Diagnosis not present

## 2020-03-31 DIAGNOSIS — Z8616 Personal history of COVID-19: Secondary | ICD-10-CM | POA: Diagnosis not present

## 2020-03-31 DIAGNOSIS — M47812 Spondylosis without myelopathy or radiculopathy, cervical region: Secondary | ICD-10-CM | POA: Diagnosis not present

## 2020-03-31 DIAGNOSIS — S7001XD Contusion of right hip, subsequent encounter: Secondary | ICD-10-CM | POA: Diagnosis not present

## 2020-03-31 DIAGNOSIS — Z888 Allergy status to other drugs, medicaments and biological substances status: Secondary | ICD-10-CM

## 2020-03-31 DIAGNOSIS — R4189 Other symptoms and signs involving cognitive functions and awareness: Secondary | ICD-10-CM | POA: Diagnosis not present

## 2020-03-31 DIAGNOSIS — R404 Transient alteration of awareness: Secondary | ICD-10-CM | POA: Diagnosis not present

## 2020-03-31 DIAGNOSIS — S12200A Unspecified displaced fracture of third cervical vertebra, initial encounter for closed fracture: Secondary | ICD-10-CM | POA: Diagnosis not present

## 2020-03-31 DIAGNOSIS — S12000A Unspecified displaced fracture of first cervical vertebra, initial encounter for closed fracture: Secondary | ICD-10-CM | POA: Diagnosis not present

## 2020-03-31 DIAGNOSIS — S12290A Other displaced fracture of third cervical vertebra, initial encounter for closed fracture: Secondary | ICD-10-CM | POA: Diagnosis not present

## 2020-03-31 DIAGNOSIS — J209 Acute bronchitis, unspecified: Secondary | ICD-10-CM | POA: Diagnosis not present

## 2020-03-31 DIAGNOSIS — S069X9D Unspecified intracranial injury with loss of consciousness of unspecified duration, subsequent encounter: Secondary | ICD-10-CM | POA: Diagnosis not present

## 2020-03-31 HISTORY — DX: Cervicalgia: M54.2

## 2020-03-31 HISTORY — DX: Bronchiectasis, uncomplicated: J47.9

## 2020-03-31 HISTORY — DX: Other specified types of non-hodgkin lymphoma, unspecified site: C85.80

## 2020-03-31 HISTORY — DX: Stress incontinence (female) (male): N39.3

## 2020-03-31 HISTORY — DX: Stable burst fracture of unspecified lumbar vertebra, initial encounter for closed fracture: S32.001A

## 2020-03-31 HISTORY — DX: Hypothyroidism, unspecified: E03.9

## 2020-03-31 HISTORY — DX: Unspecified macular degeneration: H35.30

## 2020-03-31 LAB — I-STAT ARTERIAL BLOOD GAS, ED
Acid-Base Excess: 2 mmol/L (ref 0.0–2.0)
Bicarbonate: 27.6 mmol/L (ref 20.0–28.0)
Calcium, Ion: 1.2 mmol/L (ref 1.15–1.40)
HCT: 38 % (ref 36.0–46.0)
Hemoglobin: 12.9 g/dL (ref 12.0–15.0)
O2 Saturation: 99 %
Potassium: 3.1 mmol/L — ABNORMAL LOW (ref 3.5–5.1)
Sodium: 139 mmol/L (ref 135–145)
TCO2: 29 mmol/L (ref 22–32)
pCO2 arterial: 48.5 mmHg — ABNORMAL HIGH (ref 32.0–48.0)
pH, Arterial: 7.363 (ref 7.350–7.450)
pO2, Arterial: 172 mmHg — ABNORMAL HIGH (ref 83.0–108.0)

## 2020-03-31 LAB — COMPREHENSIVE METABOLIC PANEL
ALT: 25 U/L (ref 0–44)
AST: 32 U/L (ref 15–41)
Albumin: 3.9 g/dL (ref 3.5–5.0)
Alkaline Phosphatase: 81 U/L (ref 38–126)
Anion gap: 13 (ref 5–15)
BUN: 19 mg/dL (ref 8–23)
CO2: 23 mmol/L (ref 22–32)
Calcium: 9.5 mg/dL (ref 8.9–10.3)
Chloride: 106 mmol/L (ref 98–111)
Creatinine, Ser: 1.19 mg/dL — ABNORMAL HIGH (ref 0.44–1.00)
GFR, Estimated: 46 mL/min — ABNORMAL LOW (ref >60)
Glucose, Bld: 172 mg/dL — ABNORMAL HIGH (ref 70–99)
Potassium: 3.8 mmol/L (ref 3.5–5.1)
Sodium: 142 mmol/L (ref 135–145)
Total Bilirubin: 0.6 mg/dL (ref 0.3–1.2)
Total Protein: 6.2 g/dL — ABNORMAL LOW (ref 6.5–8.1)

## 2020-03-31 LAB — URINALYSIS, ROUTINE W REFLEX MICROSCOPIC
Bacteria, UA: NONE SEEN
Bilirubin Urine: NEGATIVE
Glucose, UA: 50 mg/dL — AB
Hgb urine dipstick: NEGATIVE
Ketones, ur: NEGATIVE mg/dL
Leukocytes,Ua: NEGATIVE
Nitrite: NEGATIVE
Protein, ur: NEGATIVE mg/dL
Specific Gravity, Urine: 1.038 — ABNORMAL HIGH (ref 1.005–1.030)
pH: 7 (ref 5.0–8.0)

## 2020-03-31 LAB — PROTIME-INR
INR: 1 (ref 0.8–1.2)
Prothrombin Time: 12.6 seconds (ref 11.4–15.2)

## 2020-03-31 LAB — MRSA PCR SCREENING: MRSA by PCR: NEGATIVE

## 2020-03-31 LAB — CBC
HCT: 42.2 % (ref 36.0–46.0)
Hemoglobin: 13.3 g/dL (ref 12.0–15.0)
MCH: 26.7 pg (ref 26.0–34.0)
MCHC: 31.5 g/dL (ref 30.0–36.0)
MCV: 84.7 fL (ref 80.0–100.0)
Platelets: 274 10*3/uL (ref 150–400)
RBC: 4.98 MIL/uL (ref 3.87–5.11)
RDW: 14.8 % (ref 11.5–15.5)
WBC: 12 10*3/uL — ABNORMAL HIGH (ref 4.0–10.5)
nRBC: 0 % (ref 0.0–0.2)

## 2020-03-31 LAB — ABO/RH: ABO/RH(D): O POS

## 2020-03-31 LAB — I-STAT CHEM 8, ED
BUN: 23 mg/dL (ref 8–23)
Calcium, Ion: 1.11 mmol/L — ABNORMAL LOW (ref 1.15–1.40)
Chloride: 105 mmol/L (ref 98–111)
Creatinine, Ser: 1 mg/dL (ref 0.44–1.00)
Glucose, Bld: 165 mg/dL — ABNORMAL HIGH (ref 70–99)
HCT: 41 % (ref 36.0–46.0)
Hemoglobin: 13.9 g/dL (ref 12.0–15.0)
Potassium: 3.6 mmol/L (ref 3.5–5.1)
Sodium: 140 mmol/L (ref 135–145)
TCO2: 23 mmol/L (ref 22–32)

## 2020-03-31 LAB — TYPE AND SCREEN
ABO/RH(D): O POS
Antibody Screen: NEGATIVE

## 2020-03-31 LAB — SARS CORONAVIRUS 2 BY RT PCR (HOSPITAL ORDER, PERFORMED IN ~~LOC~~ HOSPITAL LAB): SARS Coronavirus 2: NEGATIVE

## 2020-03-31 LAB — LACTIC ACID, PLASMA: Lactic Acid, Venous: 3.3 mmol/L (ref 0.5–1.9)

## 2020-03-31 LAB — ETHANOL: Alcohol, Ethyl (B): <10 mg/dL (ref <10)

## 2020-03-31 MED ORDER — POLYETHYLENE GLYCOL 3350 17 G PO PACK
17.0000 g | PACK | Freq: Every day | ORAL | Status: DC
  Administered 2020-03-31 – 2020-04-04 (×4): 17 g
  Filled 2020-03-31 (×4): qty 1

## 2020-03-31 MED ORDER — FENTANYL 2500MCG IN NS 250ML (10MCG/ML) PREMIX INFUSION
25.0000 ug/h | INTRAVENOUS | Status: DC
  Administered 2020-03-31: 25 ug/h via INTRAVENOUS

## 2020-03-31 MED ORDER — NOREPINEPHRINE 4 MG/250ML-% IV SOLN: 0.0000 ug/min | INTRAVENOUS | Status: DC

## 2020-03-31 MED ORDER — METHOCARBAMOL 1000 MG/10ML IJ SOLN
1000.0000 mg | Freq: Three times a day (TID) | INTRAVENOUS | Status: DC
  Administered 2020-03-31 – 2020-04-04 (×12): 1000 mg via INTRAVENOUS
  Filled 2020-03-31 (×15): qty 10

## 2020-03-31 MED ORDER — ONDANSETRON 4 MG PO TBDP
4.0000 mg | ORAL_TABLET | Freq: Four times a day (QID) | ORAL | Status: DC | PRN
  Administered 2020-04-07 – 2020-04-08 (×2): 4 mg via ORAL
  Filled 2020-03-31 (×2): qty 1

## 2020-03-31 MED ORDER — LEVETIRACETAM IN NACL 1000 MG/100ML IV SOLN
1000.0000 mg | Freq: Once | INTRAVENOUS | Status: DC
  Filled 2020-03-31: qty 100

## 2020-03-31 MED ORDER — CHLORHEXIDINE GLUCONATE CLOTH 2 % EX PADS
6.0000 | MEDICATED_PAD | Freq: Every day | CUTANEOUS | Status: DC
  Administered 2020-03-31 – 2020-04-08 (×8): 6 via TOPICAL

## 2020-03-31 MED ORDER — GADOBUTROL 1 MMOL/ML IV SOLN
7.0000 mL | Freq: Once | INTRAVENOUS | Status: AC | PRN
  Administered 2020-03-31: 7 mL via INTRAVENOUS

## 2020-03-31 MED ORDER — IOHEXOL 300 MG/ML  SOLN
80.0000 mL | Freq: Once | INTRAMUSCULAR | Status: AC | PRN
  Administered 2020-03-31: 80 mL via INTRAVENOUS

## 2020-03-31 MED ORDER — HYDRALAZINE HCL 20 MG/ML IJ SOLN
10.0000 mg | INTRAMUSCULAR | Status: DC | PRN
  Administered 2020-04-08 (×2): 10 mg via INTRAVENOUS
  Filled 2020-03-31 (×2): qty 1

## 2020-03-31 MED ORDER — FENTANYL BOLUS VIA INFUSION
25.0000 ug | INTRAVENOUS | Status: DC | PRN
  Filled 2020-03-31: qty 25

## 2020-03-31 MED ORDER — DOCUSATE SODIUM 50 MG/5ML PO LIQD
100.0000 mg | Freq: Two times a day (BID) | ORAL | Status: DC
  Administered 2020-04-01 – 2020-04-02 (×3): 100 mg
  Filled 2020-03-31 (×6): qty 10

## 2020-03-31 MED ORDER — SODIUM CHLORIDE 0.9 % IV BOLUS
1000.0000 mL | Freq: Once | INTRAVENOUS | Status: AC
  Administered 2020-03-31: 1000 mL via INTRAVENOUS

## 2020-03-31 MED ORDER — ACETAMINOPHEN 160 MG/5ML PO SOLN
325.0000 mg | Freq: Four times a day (QID) | ORAL | Status: DC
  Administered 2020-03-31 – 2020-04-02 (×7): 325 mg
  Filled 2020-03-31 (×9): qty 20.3

## 2020-03-31 MED ORDER — SODIUM CHLORIDE 0.9 % IV SOLN
INTRAVENOUS | Status: DC | PRN
  Administered 2020-03-31: 500 mL via INTRAVENOUS
  Administered 2020-04-04: 1000 mL via INTRAVENOUS

## 2020-03-31 MED ORDER — OXYCODONE HCL 5 MG PO TABS: 5.0000 mg | ORAL_TABLET | Freq: Four times a day (QID) | ORAL | Status: DC | PRN

## 2020-03-31 MED ORDER — FENTANYL CITRATE (PF) 100 MCG/2ML IJ SOLN: 25.0000 ug | Freq: Once | INTRAMUSCULAR | Status: DC

## 2020-03-31 MED ORDER — OXYCODONE HCL 5 MG PO TABS: 2.5000 mg | ORAL_TABLET | Freq: Four times a day (QID) | ORAL | Status: DC | PRN

## 2020-03-31 MED ORDER — PROPOFOL 1000 MG/100ML IV EMUL
INTRAVENOUS | Status: AC
  Administered 2020-03-31: 10 ug/kg/min via INTRAVENOUS
  Filled 2020-03-31: qty 100

## 2020-03-31 MED ORDER — IOHEXOL 350 MG/ML SOLN
75.0000 mL | Freq: Once | INTRAVENOUS | Status: AC | PRN
  Administered 2020-03-31: 75 mL via INTRAVENOUS

## 2020-03-31 MED ORDER — PROPOFOL 1000 MG/100ML IV EMUL
5.0000 ug/kg/min | INTRAVENOUS | Status: DC
  Administered 2020-04-01 – 2020-04-02 (×2): 10 ug/kg/min via INTRAVENOUS
  Filled 2020-03-31 (×2): qty 100

## 2020-03-31 MED ORDER — ORAL CARE MOUTH RINSE
15.0000 mL | OROMUCOSAL | Status: DC
  Administered 2020-03-31 – 2020-04-02 (×21): 15 mL via OROMUCOSAL

## 2020-03-31 MED ORDER — NOREPINEPHRINE 4 MG/250ML-% IV SOLN
INTRAVENOUS | Status: AC
  Filled 2020-03-31: qty 250

## 2020-03-31 MED ORDER — SODIUM CHLORIDE 0.9 % IV SOLN: INTRAVENOUS | Status: DC

## 2020-03-31 MED ORDER — OXYCODONE HCL 5 MG/5ML PO SOLN: 5.0000 mg | Freq: Four times a day (QID) | ORAL | Status: DC | PRN

## 2020-03-31 MED ORDER — CHLORHEXIDINE GLUCONATE 0.12% ORAL RINSE (MEDLINE KIT)
15.0000 mL | Freq: Two times a day (BID) | OROMUCOSAL | Status: DC
  Administered 2020-03-31 – 2020-04-02 (×5): 15 mL via OROMUCOSAL

## 2020-03-31 MED ORDER — METOPROLOL TARTRATE 5 MG/5ML IV SOLN
5.0000 mg | Freq: Four times a day (QID) | INTRAVENOUS | Status: DC | PRN
  Administered 2020-04-04: 5 mg via INTRAVENOUS
  Filled 2020-03-31: qty 5

## 2020-03-31 MED ORDER — ONDANSETRON HCL 4 MG/2ML IJ SOLN
4.0000 mg | Freq: Four times a day (QID) | INTRAMUSCULAR | Status: DC | PRN
  Administered 2020-03-31 – 2020-04-05 (×4): 4 mg via INTRAVENOUS
  Filled 2020-03-31 (×4): qty 2

## 2020-03-31 NOTE — Progress Notes (Incomplete)
Responded to level 1 to support patient and staff.  Patient fail from attic floor and is un conscience.  Patient going to CT scan.  No family presence but per nurse is aware of patient accident.  Will follow as needed.  Jaclynn Major, [place], [facility], Pager [phone]

## 2020-03-31 NOTE — ED Notes (Signed)
To CT

## 2020-03-31 NOTE — ED Notes (Signed)
Dr Zada Finders with neurosurgery at bedside.

## 2020-03-31 NOTE — Progress Notes (Signed)
Patient transported to MRI and back without complications. RN at bedside. 

## 2020-03-31 NOTE — ED Notes (Signed)
[place] J collar placed by PA Elenore Rota and Dr Bobbye Morton.

## 2020-03-31 NOTE — Progress Notes (Signed)
Called Dr. Bobbye Morton b/c BP 51/44. Verbal order for 1L bolus and start levophed.

## 2020-03-31 NOTE — H&P (Addendum)
Amanda [name] 30-Dec-1940  [id number].    Chief Complaint/Reason for Consult: Level 1 trauma, fall 51ft, GCS < 9  HPI: Amanda [name] is a 80 y.o. female who presented via EMS as a level 1 trauma after patient reportedly fell ~10 ft from her attic. This was heard by her husband. On arrival patients GCS 7 and she was not moving her right side. She was noted to have a deformity of the right side of her chest wall at her shoulder with a large bruise. Patient was intubated in the trauma bay due to depressed GCS. Pelvic and CXR unremarkable besides proximal right humerus fx. Patient was taken to the CT scanner.   Unclear of patients past medical hx as there is none listed in her chart and no merged chart to review.   She has had Pfizer vaccine and booster.  She was admitted for COVID PNA in April of 2021.  ROS: Review of Systems  Unable to perform ROS: Acuity of condition    No family history on file.  PMH: HTN Lymphoma HLD Chronic neck/back pain - followed by Dr. Christella Noa  PSH: c-section x2, tubal ligation, ORIF of right ankle, see merged chart  Social History:  has no history on file for tobacco use, alcohol use, and drug use.  Allergies: Not on File  (Not in a hospital admission)   Physical Exam: Blood pressure (!) 170/102, temperature (!) 96 F (35.6 C), temperature source Temporal, resp. rate 12, height 5' (1.524 m), weight 70 kg, SpO2 96 %. General: elderly WD, WN female who is lying on trauma stretcher acutely ill. HEENT: head is normocephalic, but with small pon on the left occiput.  Sclera are noninjected.  Pupils are equal and reactive b/l.  Ears and nose without any masses or lesions.  Mouth is pink and moist. Dentition fair Neck: C-collar in place. Trachea is midline. Heart: regular but tachycardic  Normal s1,s2. No obvious murmurs, gallops, or rubs noted.  Palpable radial and pedal pulses bilaterally  Lungs: CTAB, no wheezes, rhonchi, or rales noted.  Respiratory  effort nonlabored.  She was intubated upon arrival.  ETT in place. Abd: soft, ND, unable to tell tenderness due to AMS, +BS, no masses, hernias, or organomegaly, lower midline scar present. MS: all 4 extremities are symmetrical with no cyanosis, clubbing, or edema except her RUE with obvious deformity at the proximal arm/shoulder which has a large defect and ecchymosis.  She also has a contusion on her right posterior thigh. Skin: warm and dry with no masses, lesions, or rashes Psych: unable due to acuity Neuro: GCS7, intermittently yells out with no purpose, flexes and withdrawals her LLE.  Does not move her RUE or RLE at all.  She does not withdrawal to noxious stimuli on right side either.  Most of what is on left side does not seem purposeful either.     Results for orders placed or performed during the hospital encounter of 03/31/20 (from the past 48 hour(s))  Type and screen [place]     Status: None (Preliminary result)   Collection Time: 03/31/20 11:20 AM  Result Value Ref Range   ABO/RH(D) PENDING    Antibody Screen PENDING    Sample Expiration      04/03/2020,2359 Performed at [facility], [place] [address], [place], [place] [address]   CBC     Status: Abnormal   Collection Time: 03/31/20 11:24 AM  Result Value Ref Range  WBC 12.0 (H) 4.0 - 10.5 K/uL   RBC 4.98 3.87 - 5.11 MIL/uL   Hemoglobin 13.3 12.0 - 15.0 g/dL   HCT 42.2 36.0 - 46.0 %   MCV 84.7 80.0 - 100.0 fL   MCH 26.7 26.0 - 34.0 pg   MCHC 31.5 30.0 - 36.0 g/dL   RDW 14.8 11.5 - 15.5 %   Platelets 274 150 - 400 K/uL   nRBC 0.0 0.0 - 0.2 %    Comment: Performed at [facility], [place] [address] [place], [place] [address]  Protime-INR     Status: None   Collection Time: 03/31/20 11:24 AM  Result Value Ref Range   Prothrombin Time 12.6 11.4 - 15.2 seconds   INR 1.0 0.8 - 1.2    Comment: (NOTE) INR goal varies based on device and disease states. Performed at [facility],  [place] [address] [place], [place] [address]   I-Stat Chem 8, ED     Status: Abnormal   Collection Time: 03/31/20 11:25 AM  Result Value Ref Range   Sodium 140 135 - 145 mmol/L   Potassium 3.6 3.5 - 5.1 mmol/L   Chloride 105 98 - 111 mmol/L   BUN 23 8 - 23 mg/dL   Creatinine, Ser 1.00 0.44 - 1.00 mg/dL   Glucose, Bld 165 (H) 70 - 99 mg/dL    Comment: Glucose reference range applies only to samples taken after fasting for at least 8 hours.   Calcium, Ion 1.11 (L) 1.15 - 1.40 mmol/L   TCO2 23 22 - 32 mmol/L   Hemoglobin 13.9 12.0 - 15.0 g/dL   HCT 41.0 36.0 - 46.0 %   DG Pelvis Portable  Result Date: 03/31/2020 CLINICAL DATA:  Fall from attic. EXAM: PORTABLE PELVIS 1-2 VIEWS COMPARISON:  None. FINDINGS: Portable AP view at 1125 hours. The mineralization and alignment are normal. There is no evidence of acute fracture or dislocation. The sacroiliac joints and symphysis pubis appear intact. Minimal hip degenerative changes bilaterally. There is lower lumbar spondylosis. Scattered vascular calcifications are noted. IMPRESSION: No acute osseous findings. Minimal hip degenerative changes bilaterally. Electronically Signed   By: Richardean Sale M.D.   On: 03/31/2020 11:35   DG Chest Port 1 View  Result Date: 03/31/2020 CLINICAL DATA:  Trauma, fall EXAM: PORTABLE CHEST 1 VIEW COMPARISON:  None. FINDINGS: Endotracheal intubation, tube tip projecting over the lower trachea, approximately 3 cm above the carina. Minimal retrocardiac atelectasis or scarring. No pneumothorax. The heart and mediastinum are unremarkable. Right chest port catheter. No displaced rib fracture. Fractures of the proximal right humerus, partially imaged. IMPRESSION: 1. Endotracheal intubation, tube tip projecting over the lower trachea, approximately 3 cm above the carina. 2. Minimal retrocardiac atelectasis or scarring. No pneumothorax or other acute abnormality of the lungs. 3. No displaced rib fracture. 4. Fractures of the proximal  right humerus, partially imaged. Electronically Signed   By: Eddie Candle M.D.   On: 03/31/2020 11:35   Anti-infectives (From admission, onward)   None      Assessment/Plan Fall from attic, ~53ft Right proximal humerus fx - Ortho has evaluated. Non-Op. Per Dr. Doreatha Martin. NWB. Sling  Right posterior thigh contusion - Trend hgb. Ice  VDRF  R hemiparesis - Given there is no radiographic explanation for patients right hemiparesis, will obtain CTA of the head and neck. Unable to re-examine patients neuro status as patient received Rocuronium for intubation.  C2-4 TVP Fx - remain in c-collar, Dr. Zada Finders consulted and  will see.  CTA neck/head pending to rule out arterial injury and to further evaluate given neurologic exam on arrival.  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  Received Rituxan every 3 months Chronic neck/back pain - followed by Dr. Christella Noa Mild AKI - cr 1.19.  Recheck in am, IVFs FEN - NPO, IVFs VTE - on hold pending further work up ID - none currently warranted  Critical care time: 110 minutes*  Henreitta Cea, [facility] Surgery 03/31/2020, 12:59 PM  Please see Amion for pager number during day hours 7:00am-4:30pm

## 2020-03-31 NOTE — TOC CAGE-AID Note (Signed)
Transition of Care [facility]) - CAGE-AID Screening   Patient Details  Name: Amanda [name] MRN: [id number] Date of Birth: [birth year]  [facility] CM/SW Contact:    Dia Crawford, RN Phone Number: 03/31/2020, 3:25 PM   Clinical Narrative:  Patient intubated at this time, unable to participate   CAGE-AID Screening: Substance Abuse Screening unable to be completed due to: : Patient unable to participate

## 2020-03-31 NOTE — Plan of Care (Signed)
CPOT 0, RASS -1 at this time.

## 2020-03-31 NOTE — ED Provider Notes (Signed)
[facility] EMERGENCY DEPARTMENT Provider Note   CSN: [id number] Arrival date & time: 03/31/20  1111     History No chief complaint on file.   Amanda [name] is a 80 y.o. female.  HPI Level 5 caveat due to altered mental status. Patient brought in as a level 1 trauma.  Met upon arrival by myself and the trauma team.  Reportedly fell out of her attic and found on the ground.  Decreased mental status and respirations being supported by EMS.  Reportedly hypotensive.  EMS states that she will moan but not doing much else.    No past medical history on file.  Patient Active Problem List   Diagnosis Date Noted  . Cervical spine fracture ([place]) 03/31/2020       OB History   No obstetric history on file.     No family history on file.     Home Medications Prior to Admission medications   Not on File    Allergies    Promethazine  Review of Systems   Review of Systems  Unable to perform ROS: Mental status change    Physical Exam Updated Vital Signs BP (!) 143/97   Pulse (!) 117   Temp (!) 96 F (35.6 C) (Temporal)   Resp 20   Ht 5' (1.524 m)   Wt 70 kg   SpO2 100%   BMI 30.14 kg/m   Physical Exam Vitals and nursing note reviewed.  Constitutional:      Comments: Mild abrasion to left forehead.  Hematoma to left parietal area.  HENT:     Nose: Nose normal.     Mouth/Throat:     Mouth: Mucous membranes are moist.  Eyes:     Comments: Pupils are round 3 mm and reactive.  Neck:     Comments: Cervical collar in place.  No midline step-off or deformity. Cardiovascular:     Rate and Rhythm: Tachycardia present.  Pulmonary:     Breath sounds: No wheezing or rhonchi.  Chest:     Chest wall: No tenderness.  Abdominal:     General: There is no distension.  Musculoskeletal:     Comments: Hematoma to right anterior shoulder.  Moans with movement at the right arm.  Skin:    General: Skin is warm.  Neurological:     Comments: Will moan/yell  with pain.  Has been moving left side to pain but has not moved right side.  Face appears symmetric.  Breathing spontaneously.  Nonverbal.     ED Results / Procedures / Treatments   Labs (all labs ordered are listed, but only abnormal results are displayed) Labs Reviewed  COMPREHENSIVE METABOLIC PANEL - Abnormal; Notable for the following components:      Result Value   Glucose, Bld 172 (*)    Creatinine, Ser 1.19 (*)    Total Protein 6.2 (*)    GFR, Estimated 46 (*)    All other components within normal limits  CBC - Abnormal; Notable for the following components:   WBC 12.0 (*)    All other components within normal limits  LACTIC ACID, PLASMA - Abnormal; Notable for the following components:   Lactic Acid, Venous 3.3 (*)    All other components within normal limits  I-STAT CHEM 8, ED - Abnormal; Notable for the following components:   Glucose, Bld 165 (*)    Calcium, Ion 1.11 (*)    All other components within normal limits  I-STAT ARTERIAL BLOOD  GAS, ED - Abnormal; Notable for the following components:   pCO2 arterial 48.5 (*)    pO2, Arterial 172 (*)    Potassium 3.1 (*)    All other components within normal limits  SARS CORONAVIRUS 2 BY RT PCR (HOSPITAL ORDER, [place] [facility])  ETHANOL  PROTIME-INR  URINALYSIS, ROUTINE W REFLEX MICROSCOPIC  BLOOD GAS, ARTERIAL  TYPE AND SCREEN  SAMPLE TO BLOOD BANK  ABO/RH    EKG None  Radiology CT HEAD WO CONTRAST  Result Date: 03/31/2020 CLINICAL DATA:  Fall, facial trauma EXAM: CT HEAD WITHOUT CONTRAST CT CERVICAL SPINE WITHOUT CONTRAST TECHNIQUE: Multidetector CT imaging of the head and cervical spine was performed following the standard protocol without intravenous contrast. Multiplanar CT image reconstructions of the cervical spine were also generated. COMPARISON:  None. FINDINGS: CT HEAD FINDINGS Brain: No acute intracranial hemorrhage. No focal mass lesion. No CT evidence of acute infarction. No midline  shift or mass effect. No hydrocephalus. Basilar cisterns are patent. There are periventricular and subcortical white matter hypodensities. Generalized cortical atrophy. Vascular: No hyperdense vessel or unexpected calcification. Skull: Normal. Negative for fracture or focal lesion. Sinuses/Orbits: Paranasal sinuses and mastoid air cells are clear. Orbits are clear. Other: A shallow subcutaneous hematoma over the LEFT parietal bone pain measures 5 mm in depth (image 24/3) no associated skull fracture. CT CERVICAL SPINE FINDINGS Alignment: Straightening normal cervical lordosis. Skull base and vertebrae: There fractures of the transverse process on the RIGHT at C2, C3 and C4. These are well seen coronal image series 7. The cannot exclude involvement of the tuber artery. No vertebral body fracture identified.  No subluxation. Soft tissues and spinal canal: Intubated patient. Small amount hematoma in the soft tissue adjacent to the RIGHT cervical transverse process fractures (image 39-55 of series 5) Disc levels:  Endplate osteophytosis C4 to C7. Upper chest: Clear Other: Nondisplaced fracture of the LEFT first rib and second riba IMPRESSION: 1. No intracranial trauma.  No CT evidence of acute infarction. 2. Small scalp hematoma over the LEFT parietal bone. 3. Transverse process fractures on the RIGHT from C2 through C4. Cannot exclude of RIGHT vertebral artery involvement. Consider CTA neck 4. No evidence of vertebral body fracture otherwise. No subluxation. 5. Nondisplaced fractures posteriorly of the LEFT first and second ribs. 6. Intubated patient. Findings conveyed toAYESHA LOVICK on 03/31/2020  at12:15. Electronically Signed   By: Suzy Bouchard M.D.   On: 03/31/2020 12:17   CT CERVICAL SPINE WO CONTRAST  Result Date: 03/31/2020 CLINICAL DATA:  Fall, facial trauma EXAM: CT HEAD WITHOUT CONTRAST CT CERVICAL SPINE WITHOUT CONTRAST TECHNIQUE: Multidetector CT imaging of the head and cervical spine was performed  following the standard protocol without intravenous contrast. Multiplanar CT image reconstructions of the cervical spine were also generated. COMPARISON:  None. FINDINGS: CT HEAD FINDINGS Brain: No acute intracranial hemorrhage. No focal mass lesion. No CT evidence of acute infarction. No midline shift or mass effect. No hydrocephalus. Basilar cisterns are patent. There are periventricular and subcortical white matter hypodensities. Generalized cortical atrophy. Vascular: No hyperdense vessel or unexpected calcification. Skull: Normal. Negative for fracture or focal lesion. Sinuses/Orbits: Paranasal sinuses and mastoid air cells are clear. Orbits are clear. Other: A shallow subcutaneous hematoma over the LEFT parietal bone pain measures 5 mm in depth (image 24/3) no associated skull fracture. CT CERVICAL SPINE FINDINGS Alignment: Straightening normal cervical lordosis. Skull base and vertebrae: There fractures of the transverse process on the RIGHT at C2, C3 and C4.  These are well seen coronal image series 7. The cannot exclude involvement of the tuber artery. No vertebral body fracture identified.  No subluxation. Soft tissues and spinal canal: Intubated patient. Small amount hematoma in the soft tissue adjacent to the RIGHT cervical transverse process fractures (image 39-55 of series 5) Disc levels:  Endplate osteophytosis C4 to C7. Upper chest: Clear Other: Nondisplaced fracture of the LEFT first rib and second riba IMPRESSION: 1. No intracranial trauma.  No CT evidence of acute infarction. 2. Small scalp hematoma over the LEFT parietal bone. 3. Transverse process fractures on the RIGHT from C2 through C4. Cannot exclude of RIGHT vertebral artery involvement. Consider CTA neck 4. No evidence of vertebral body fracture otherwise. No subluxation. 5. Nondisplaced fractures posteriorly of the LEFT first and second ribs. 6. Intubated patient. Findings conveyed toAYESHA LOVICK on 03/31/2020  at12:15. Electronically  Signed   By: Suzy Bouchard M.D.   On: 03/31/2020 12:17   DG Pelvis Portable  Result Date: 03/31/2020 CLINICAL DATA:  Fall from attic. EXAM: PORTABLE PELVIS 1-2 VIEWS COMPARISON:  None. FINDINGS: Portable AP view at 1125 hours. The mineralization and alignment are normal. There is no evidence of acute fracture or dislocation. The sacroiliac joints and symphysis pubis appear intact. Minimal hip degenerative changes bilaterally. There is lower lumbar spondylosis. Scattered vascular calcifications are noted. IMPRESSION: No acute osseous findings. Minimal hip degenerative changes bilaterally. Electronically Signed   By: Richardean Sale M.D.   On: 03/31/2020 11:35   CT CHEST ABDOMEN PELVIS W CONTRAST  Result Date: 03/31/2020 CLINICAL DATA:  Fall.  Fall from 10 feet EXAM: CT CHEST, ABDOMEN, AND PELVIS WITH CONTRAST TECHNIQUE: Multidetector CT imaging of the chest, abdomen and pelvis was performed following the standard protocol during bolus administration of intravenous contrast. CONTRAST:  65m OMNIPAQUE IOHEXOL 300 MG/ML  SOLN COMPARISON:  None. FINDINGS: CT CHEST FINDINGS Cardiovascular: no contour abnormality of the aorta. No pericardial fluid. No mediastinal hematoma. Mediastinum/Nodes: Endotracheal tube in good position. Port in the anterior chest wall with tip in distal SVC. Esophagus normal. Lungs/Pleura: Mild pleural thickening a posteriorly at the lung apices. No pneumothorax. Atelectasis in the RIGHT middle lobe with air bronchograms/bronchiectasis Musculoskeletal: Nondisplaced fractures of the posterior LEFT first and second ribs (image 24 and 28 of series 4). Additional fracture of the lateral LEFT third rib on image 43. Impaction fracture of the RIGHT humerus. No scapular fracture. No thoracic spine fracture CT ABDOMEN AND PELVIS FINDINGS Hepatobiliary: No hepatic laceration. Pancreas: Pancreas is normal. No ductal dilatation. No pancreatic inflammation. Spleen: No splenic laceration  Adrenals/urinary tract: Adrenal glands normal. Kidneys enhance symmetrically. Bladder intact Stomach/Bowel: Dependent high-density material in the gastric body is favored ingested material. Small-bowel, appendix, and cecum normal. No evidence of bowel injury. No fluid in the mesentery. Vascular/Lymphatic: No abdominal aortic acute findings. Intimal calcification. Iliac arteries are normal. Veins grossly normal. Reproductive: Uterus and adnexa unremarkable. Other: No free fluid. Musculoskeletal: No pelvic fracture. Anterior wedge deformity of the L1 vertebral body is favored chronic. Subcutaneous densities in the LEFT flank over the gluteus maximus muscle could represent hematoma (image 111/3. IMPRESSION: Chest Impression: 1. No evidence of thoracic aortic injury. 2. Nondisplaced fractures of the posterior LEFT first, second, and third ribs. 3. No pneumothorax. 4. Mild pleural thickening in the RIGHT lung apex related to the fractures. 5. Acute proximal RIGHT humerus fracture with overlying hematoma. Abdomen / Pelvis Impression: *No solid organ injury in the abdomen pelvis. *No pelvic fracture spine fracture. *Potential LEFT flank hematoma  posteriorly Findings conveyed toLovick MD on 03/31/2020  at12:31. Electronically Signed   By: Suzy Bouchard M.D.   On: 03/31/2020 12:31   DG Chest Port 1 View  Result Date: 03/31/2020 CLINICAL DATA:  Trauma, fall EXAM: PORTABLE CHEST 1 VIEW COMPARISON:  None. FINDINGS: Endotracheal intubation, tube tip projecting over the lower trachea, approximately 3 cm above the carina. Minimal retrocardiac atelectasis or scarring. No pneumothorax. The heart and mediastinum are unremarkable. Right chest port catheter. No displaced rib fracture. Fractures of the proximal right humerus, partially imaged. IMPRESSION: 1. Endotracheal intubation, tube tip projecting over the lower trachea, approximately 3 cm above the carina. 2. Minimal retrocardiac atelectasis or scarring. No pneumothorax or  other acute abnormality of the lungs. 3. No displaced rib fracture. 4. Fractures of the proximal right humerus, partially imaged. Electronically Signed   By: Eddie Candle M.D.   On: 03/31/2020 11:35   DG Shoulder Right Port  Result Date: 03/31/2020 CLINICAL DATA:  Bruising to proximal right humerus EXAM: PORTABLE RIGHT SHOULDER COMPARISON:  None. FINDINGS: There is no dislocation. Fracture of the proximal right humerus. Fracture involves the greater tuberosity. There is likely anterior displacement. IMPRESSION: Acute fracture of the proximal right humerus with likely anterior displacement. Electronically Signed   By: Macy Mis M.D.   On: 03/31/2020 12:27    Procedures Procedure Name: Intubation Date/Time: 03/31/2020 11:10 AM Performed by: Davonna Belling, MD Pre-anesthesia Checklist: Patient identified Oxygen Delivery Method: Ambu bag Preoxygenation: Pre-oxygenation with 100% oxygen Induction Type: Rapid sequence Ventilation: Mask ventilation without difficulty Laryngoscope Size: Glidescope and 3 Grade View: Grade III Tube type: Subglottic suction tube Tube size: 7.5 mm Number of attempts: 1 Tube secured with: ETT holder Dental Injury: Teeth and Oropharynx as per pre-operative assessment         Medications Ordered in ED Medications  propofol (DIPRIVAN) 1000 MG/100ML infusion (has no administration in time range)  fentaNYL 2563mg in NS 2553m(1065mml) infusion-PREMIX (25 mcg/hr Intravenous New Bag/Given 03/31/20 1138)  levETIRAcetam (KEPPRA) IVPB 1000 mg/100 mL premix ( Intravenous Canceled Entry 03/31/20 1215)  docusate (COLACE) 50 MG/5ML liquid 100 mg (has no administration in time range)  polyethylene glycol (MIRALAX / GLYCOLAX) packet 17 g (has no administration in time range)  0.9 %  sodium chloride infusion (has no administration in time range)  fentaNYL (SUBLIMAZE) bolus via infusion 25 mcg (has no administration in time range)  acetaminophen (TYLENOL) 160 MG/5ML  solution 325 mg (has no administration in time range)  ondansetron (ZOFRAN-ODT) disintegrating tablet 4 mg (has no administration in time range)    Or  ondansetron (ZOFRAN) injection 4 mg (has no administration in time range)  metoprolol tartrate (LOPRESSOR) injection 5 mg (has no administration in time range)  hydrALAZINE (APRESOLINE) injection 10 mg (has no administration in time range)  oxyCODONE (Oxy IR/ROXICODONE) immediate release tablet 5 mg (has no administration in time range)  propofol (DIPRIVAN) 1000 MG/100ML infusion (has no administration in time range)  iohexol (OMNIPAQUE) 300 MG/ML solution 80 mL (80 mLs Intravenous Contrast Given 03/31/20 1141)  iohexol (OMNIPAQUE) 350 MG/ML injection 75 mL (75 mLs Intravenous Contrast Given 03/31/20 1256)    ED Course  I have reviewed the triage vital signs and the nursing notes.  Pertinent labs & imaging results that were available during my care of the patient were reviewed by me and considered in my medical decision making (see chart for details).    MDM Rules/Calculators/A&P  Patient presents as a level 1 trauma.  Reportedly fell out of her attic.  Decreased responsiveness.  Yells to pain but no commands and nonverbal otherwise.  Will move somewhat on the left side had not been seen moving the right side.  Has obvious proximal humerus fracture on right.  Intubated for airway protection. Taken to CT scan.  Has some cervical spine fractures of the transverse process.  Also 3 rib fractures.  CTA of head and neck ordered.  Will admit to trauma ICU.  CRITICAL CARE Performed by: Davonna Belling Total critical care time: 30 minutes Critical care time was exclusive of separately billable procedures and treating other patients. Critical care was necessary to treat or prevent imminent or life-threatening deterioration. Critical care was time spent personally by me on the following activities: development of treatment  plan with patient and/or surrogate as well as nursing, discussions with consultants, evaluation of patient's response to treatment, examination of patient, obtaining history from patient or surrogate, ordering and performing treatments and interventions, ordering and review of laboratory studies, ordering and review of radiographic studies, pulse oximetry and re-evaluation of patient's condition.  Final Clinical Impression(s) / ED Diagnoses Final diagnoses:  Fall  Other closed displaced fracture of proximal end of right humerus, initial encounter  Closed fracture of multiple ribs, unspecified laterality, initial encounter  Cervical transverse process fracture, initial encounter [facility])    Rx / DC Orders ED Discharge Orders    None       Davonna Belling, MD 03/31/20 1319

## 2020-03-31 NOTE — Progress Notes (Signed)
Orthopedic Tech Progress Note Patient Details:  Amanda [name] 1940-07-13 [id number]  Ortho Devices Type of Ortho Device: Arm sling Ortho Device/Splint Location: rue Ortho Device/Splint Interventions: Ordered,Application,Adjustment   Post Interventions Patient Tolerated: Other (comment) Instructions Provided: Other (comment)   [name] 03/31/2020, 9:18 PM

## 2020-03-31 NOTE — Progress Notes (Signed)
Orthopedic Tech Progress Note Patient Details:  Amanda [name] 08-22-1940 [id number] Level 1 trauma. Not needed at this moment  Patient ID: Amanda [name], female   DOB: 06-17-1940, 80 y.o.   MRN: [id number]   Amanda [name] 03/31/2020, 12:03 PM

## 2020-03-31 NOTE — Consult Note (Signed)
Neurosurgery Consultation  Reason for Consult: Cervical spine fracture, weakness Referring Physician: Bobbye Morton  CC: Fall  HPI: This is a 80 y.o. woman that presents after a fall of 10 feet with right sided weakness. Other known injuries at this time include R humerus frx. They are currently in the emergency department still undergoing further evaluation. The patient was intubated in the trauma bay for low GCS, further history therefore unfortunately limited. Per EMS she was hemiplegic on the right since their arrival and on arrival to the ED.  ROS: A 14 point ROS was performed and is negative except as noted in the HPI, but limited due to the patient's mental status.   PMHx: No past medical history on file. FamHx: No family history on file. SocHx:  has no history on file for tobacco use, alcohol use, and drug use.  Exam: Vital signs in last 24 hours: Temp:  [96 F (35.6 C)] 96 F (35.6 C) (01/28 1113) Pulse Rate:  [114-121] 117 (01/28 1215) Resp:  [7-22] 20 (01/28 1215) BP: (139-170)/(97-132) 143/97 (01/28 1215) SpO2:  [96 %-100 %] 100 % (01/28 1215) FiO2 (%):  [40 %-100 %] 40 % (01/28 1140) Weight:  [70 kg] 70 kg (01/28 1141) General: Intubated, laying in hospital bed, appears acutely ill  Head: Normocephalic and atruamatic HEENT: Wearing well fitted rigid cervical collar Pulmonary: intubated, good chest rise bilaterally Cardiac: RRR Abdomen: S NT ND Extremities: Warm and well perfused x4, +trigger finger 3rd digit on R Neuro: Intubated, FCx4 and answers questions with squeezing for yes/no, full strength on the left with decreased sensation and minimal movement in RUE/RLE, unable to assess facial symmetry accurately due to ETT / holder   Assessment and Plan: 80 y.o. woman s/p fall w/ new R hemiplegia. 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   -MRI C-spine and brain -please call with any concerns or questions  [name], MD 03/31/20 1:07 PM [facility]

## 2020-03-31 NOTE — ED Notes (Signed)
PT to MRI with SWAT RN

## 2020-03-31 NOTE — Progress Notes (Signed)
MRI C-sp reviewed. C-spine shows known fractures, C1-2 posterior osteophyte with stenosis but patent canal, C5-6 interspinous STIR changes, known degenerative disease.  MRI brain shows L diffusion changes in the motor strip and superior frontal gyrus on the left with a small amount of adjacent hemorrhage, close to her scalp hematoma.   With right sided weakness and some diffusion in the L motor cortex and no cervical cord signal change, this is the most likely explanation for her exam, including the fairly rapid improvement that I already started seeing in the trauma bay.   -C-spine fracture pattern / interspinous ligament injury is a stable fracture pattern, can d/c cervical collar, no indication for surgical stabilization  -Cortical diffusion changes are right underneath her point of impact, too much of a coincidence to be unrelated or embolic, no treatment indicated, will follow exam - given how small they are I would expect her function to continue to recover fairly significantly in the next day or two.

## 2020-03-31 NOTE — Consult Note (Addendum)
Reason for Consult:Right humerus fx Referring Physician: A Lovick Time called: [id number] Time at bedside: Amanda [name] is an 80 y.o. female.  HPI: Amanda [name] fell out of an attic earlier today, a distance of about 10 feet. Husband heard the fall. She was obtunded and not moving her right side. She was brought in as a level 1 trauma activation and intubated. A right humerus fx was noted on her right side and orthopedic surgery was consulted.  No past medical history on file.  No family history on file.  Social History:  has no history on file for tobacco use, alcohol use, and drug use.  Allergies: Not on File  Medications: I have reviewed the patient's current medications.  Results for orders placed or performed during the hospital encounter of 03/31/20 (from the past 48 hour(s))  Type and screen Mobile     Status: None (Preliminary result)   Collection Time: 03/31/20 11:20 AM  Result Value Ref Range   ABO/RH(D) O POS    Antibody Screen PENDING    Sample Expiration      04/03/2020,2359 Performed at [facility], [place] [address], [place], [place] [address]   CBC     Status: Abnormal   Collection Time: 03/31/20 11:24 AM  Result Value Ref Range   WBC 12.0 (H) 4.0 - 10.5 K/uL   RBC 4.98 3.87 - 5.11 MIL/uL   Hemoglobin 13.3 12.0 - 15.0 g/dL   HCT 42.2 36.0 - 46.0 %   MCV 84.7 80.0 - 100.0 fL   MCH 26.7 26.0 - 34.0 pg   MCHC 31.5 30.0 - 36.0 g/dL   RDW 14.8 11.5 - 15.5 %   Platelets 274 150 - 400 K/uL   nRBC 0.0 0.0 - 0.2 %    Comment: Performed at [facility], [place] [address], [place], [place] [address]  Lactic acid, plasma     Status: Abnormal   Collection Time: 03/31/20 11:24 AM  Result Value Ref Range   Lactic Acid, Venous 3.3 (HH) 0.5 - 1.9 mmol/L    Comment: CRITICAL RESULT CALLED TO, READ BACK BY AND VERIFIED WITH: MICHAELSON,B RN @1213  ON [id number] BY FLEMINGS Performed at [facility], [place] [address], [place], [place] [address]    Protime-INR     Status: None   Collection Time: 03/31/20 11:24 AM  Result Value Ref Range   Prothrombin Time 12.6 11.4 - 15.2 seconds   INR 1.0 0.8 - 1.2    Comment: (NOTE) INR goal varies based on device and disease states. Performed at [facility], [place] [address], [place], [place] [address]   I-Stat Chem 8, ED     Status: Abnormal   Collection Time: 03/31/20 11:25 AM  Result Value Ref Range   Sodium 140 135 - 145 mmol/L   Potassium 3.6 3.5 - 5.1 mmol/L   Chloride 105 98 - 111 mmol/L   BUN 23 8 - 23 mg/dL   Creatinine, Ser 1.00 0.44 - 1.00 mg/dL   Glucose, Bld 165 (H) 70 - 99 mg/dL    Comment: Glucose reference range applies only to samples taken after fasting for at least 8 hours.   Calcium, Ion 1.11 (L) 1.15 - 1.40 mmol/L   TCO2 23 22 - 32 mmol/L   Hemoglobin 13.9 12.0 - 15.0 g/dL   HCT 41.0 36.0 - 46.0 %    DG Pelvis Portable  Result Date: 03/31/2020 CLINICAL DATA:  Fall from  attic. EXAM: PORTABLE PELVIS 1-2 VIEWS COMPARISON:  None. FINDINGS: Portable AP view at 1125 hours. The mineralization and alignment are normal. There is no evidence of acute fracture or dislocation. The sacroiliac joints and symphysis pubis appear intact. Minimal hip degenerative changes bilaterally. There is lower lumbar spondylosis. Scattered vascular calcifications are noted. IMPRESSION: No acute osseous findings. Minimal hip degenerative changes bilaterally. Electronically Signed   By: Richardean Sale M.D.   On: 03/31/2020 11:35   DG Chest Port 1 View  Result Date: 03/31/2020 CLINICAL DATA:  Trauma, fall EXAM: PORTABLE CHEST 1 VIEW COMPARISON:  None. FINDINGS: Endotracheal intubation, tube tip projecting over the lower trachea, approximately 3 cm above the carina. Minimal retrocardiac atelectasis or scarring. No pneumothorax. The heart and mediastinum are unremarkable. Right chest port catheter. No displaced rib fracture. Fractures of the proximal right humerus, partially imaged. IMPRESSION: 1.  Endotracheal intubation, tube tip projecting over the lower trachea, approximately 3 cm above the carina. 2. Minimal retrocardiac atelectasis or scarring. No pneumothorax or other acute abnormality of the lungs. 3. No displaced rib fracture. 4. Fractures of the proximal right humerus, partially imaged. Electronically Signed   By: Eddie Candle M.D.   On: 03/31/2020 11:35    Review of Systems  Unable to perform ROS: Intubated   Blood pressure (!) 170/102, temperature (!) 96 F (35.6 C), temperature source Temporal, resp. rate 20, height 5' (1.524 m), weight 70 kg, SpO2 98 %. Physical Exam Constitutional:      General: She is not in acute distress.    Appearance: She is well-developed and well-nourished. She is not diaphoretic.  HENT:     Head: Normocephalic and atraumatic.  Eyes:     General: No scleral icterus.       Right eye: No discharge.        Left eye: No discharge.  Neck:     Comments: C-collar Cardiovascular:     Rate and Rhythm: Normal rate and regular rhythm.  Pulmonary:     Effort: Pulmonary effort is normal. No respiratory distress.  Musculoskeletal:     Comments: Right shoulder, elbow, wrist, digits- no skin wounds, ecchymotic anterior shoulder with edema, no instability, no blocks to motion  Sens  Ax/R/M/U could not assess  Mot   Ax/ R/ PIN/ M/ AIN/ U could not assess  Rad 2+  Left shoulder, elbow, wrist, digits- no skin wounds, no instability, no blocks to motion  Sens  Ax/R/M/U could not assess  Mot   Ax/ R/ PIN/ M/ AIN/ U could not assess  Rad 2+  Pelvis--no traumatic wounds or rash, no ecchymosis, stable to manual stress, nontender  BLE No traumatic wounds or rash, ecchymosis posterior right thigh  No knee or ankle effusion  Knee stable to varus/ valgus and anterior/posterior stress  Sens DPN, SPN, TN could not assess  Motor EHL, ext, flex, evers could not assess  DP 2+, PT 2+, No significant edema  Skin:    General: Skin is warm and dry.  Psychiatric:         Mood and Affect: Mood and affect normal.     Comments: Intubated     Assessment/Plan: Right humerus  fx -- Plan non-operative management with NWB and sling. F/u with Dr. Doreatha Martin at discharge.    Lisette Abu, PA-C Orthopedic Surgery [phone] 03/31/2020, 12:14 PM

## 2020-04-01 LAB — PROTIME-INR
INR: 1 (ref 0.8–1.2)
Prothrombin Time: 13.1 seconds (ref 11.4–15.2)

## 2020-04-01 LAB — COMPREHENSIVE METABOLIC PANEL
ALT: 23 U/L (ref 0–44)
AST: 33 U/L (ref 15–41)
Albumin: 3.2 g/dL — ABNORMAL LOW (ref 3.5–5.0)
Alkaline Phosphatase: 55 U/L (ref 38–126)
Anion gap: 10 (ref 5–15)
BUN: 20 mg/dL (ref 8–23)
CO2: 21 mmol/L — ABNORMAL LOW (ref 22–32)
Calcium: 8 mg/dL — ABNORMAL LOW (ref 8.9–10.3)
Chloride: 109 mmol/L (ref 98–111)
Creatinine, Ser: 0.98 mg/dL (ref 0.44–1.00)
GFR, Estimated: 58 mL/min — ABNORMAL LOW (ref >60)
Glucose, Bld: 126 mg/dL — ABNORMAL HIGH (ref 70–99)
Potassium: 4 mmol/L (ref 3.5–5.1)
Sodium: 140 mmol/L (ref 135–145)
Total Bilirubin: 0.6 mg/dL (ref 0.3–1.2)
Total Protein: 5.1 g/dL — ABNORMAL LOW (ref 6.5–8.1)

## 2020-04-01 LAB — CBC
HCT: 32.5 % — ABNORMAL LOW (ref 36.0–46.0)
Hemoglobin: 10.3 g/dL — ABNORMAL LOW (ref 12.0–15.0)
MCH: 26.9 pg (ref 26.0–34.0)
MCHC: 31.7 g/dL (ref 30.0–36.0)
MCV: 84.9 fL (ref 80.0–100.0)
Platelets: 175 10*3/uL (ref 150–400)
RBC: 3.83 MIL/uL — ABNORMAL LOW (ref 3.87–5.11)
RDW: 15.3 % (ref 11.5–15.5)
WBC: 11.4 10*3/uL — ABNORMAL HIGH (ref 4.0–10.5)
nRBC: 0 % (ref 0.0–0.2)

## 2020-04-01 LAB — TRIGLYCERIDES: Triglycerides: 107 mg/dL (ref <150)

## 2020-04-01 NOTE — Progress Notes (Signed)
Follow up - Trauma Critical Care  Patient Details:    Amanda [name] is an 80 y.o. female.  Lines/tubes : Airway 7.5 mm (Active)  Secured at (cm) 23 cm 04/01/20 0744  Measured From Lips 04/01/20 0744  Secured Location Left 04/01/20 0744  Secured By [employer] 04/01/20 0744  Tube Holder Repositioned Yes 04/01/20 0744  Prone position No 03/31/20 2352  Cuff Pressure (cm H2O) 30 cm H2O 03/31/20 1115  Site Condition Cool;Dry 04/01/20 0744     NG/OG Tube [facility] mouth Xray Measured external length of tube 46 cm (Active)  Cm Marking at Nare/Corner of Mouth (if applicable) 46 cm 16/10/96 0800  Site Assessment Clean;Dry;Intact 04/01/20 0800  Ongoing Placement Verification No change in cm markings or external length of tube from initial placement;No acute changes, not attributed to clinical condition;No change in respiratory status 04/01/20 0800  Status Suction-low intermittent 04/01/20 0800     External Urinary Catheter (Active)  Collection Container Dedicated Suction Canister 04/01/20 0800  Securement Method Securing device (Describe) 04/01/20 0800  Site Assessment Clean;Intact 04/01/20 0800  Intervention Equipment Changed 03/31/20 1628  Output (mL) 600 mL 03/31/20 1628    Microbiology/Sepsis markers: Results for orders placed or performed during the hospital encounter of 03/31/20  SARS Coronavirus 2 by RT PCR (hospital order, performed in [facility] hospital lab) Nasopharyngeal Nasopharyngeal Swab     Status: None   Collection Time: 03/31/20 11:24 AM   Specimen: Nasopharyngeal Swab  Result Value Ref Range Status   SARS Coronavirus 2 NEGATIVE NEGATIVE Final    Comment: (NOTE) SARS-CoV-2 target nucleic acids are NOT DETECTED.  The SARS-CoV-2 RNA is generally detectable in upper and lower respiratory specimens during the acute phase of infection. The lowest concentration of SARS-CoV-2 viral copies this assay can detect is 250 copies / mL. A negative result does  not preclude SARS-CoV-2 infection and should not be used as the sole basis for treatment or other patient management decisions.  A negative result may occur with improper specimen collection / handling, submission of specimen other than nasopharyngeal swab, presence of viral mutation(s) within the areas targeted by this assay, and inadequate number of viral copies (<250 copies / mL). A negative result must be combined with clinical observations, patient history, and epidemiological information.  Fact Sheet for Patients:   StrictlyIdeas.no  Fact Sheet for Healthcare Providers: BankingDealers.co.za  This test is not yet approved or  cleared by the [place] FDA and has been authorized for detection and/or diagnosis of SARS-CoV-2 by FDA under an Emergency Use Authorization (EUA).  This EUA will remain in effect (meaning this test can be used) for the duration of the COVID-19 declaration under Section 564(b)(1) of the Act, 21 U.S.C. section 360bbb-3(b)(1), unless the authorization is terminated or revoked sooner.  Performed at [facility], [place] [address] [place], [place] [address]   MRSA PCR Screening     Status: None   Collection Time: 03/31/20  4:33 PM   Specimen: Urine, Random; Nasopharyngeal  Result Value Ref Range Status   MRSA by PCR NEGATIVE NEGATIVE Final    Comment:        The GeneXpert MRSA Assay (FDA approved for NASAL specimens only), is one component of a comprehensive MRSA colonization surveillance program. It is not intended to diagnose MRSA infection nor to guide or monitor treatment for MRSA infections. Performed at [facility], [place] [address], [place], [place] [address]     Anti-infectives:  Anti-infectives (  From admission, onward)   None      Best Practice/Protocols:  VTE Prophylaxis: Mechanical  Consults: Treatment Team:  Haddix, Thomasene Lot, MD Md, Trauma, MD [name], MD     Studies:    Events:  Subjective:    Overnight Issues: No acute events reported by nursing  Objective:  Vital signs for last 24 hours: Temp:  [96 F (35.6 C)-98.7 F (37.1 C)] 98.4 F (36.9 C) (01/29 0800) Pulse Rate:  [65-121] 66 (01/29 0800) Resp:  [7-27] 20 (01/29 0800) BP: (51-170)/(30-132) 107/63 (01/29 0800) SpO2:  [96 %-100 %] 100 % (01/29 0800) FiO2 (%):  [40 %-100 %] 40 % (01/29 0800) Weight:  [70 kg] 70 kg (01/28 1141)  Hemodynamic parameters for last 24 hours:    Intake/Output from previous day: 01/28 0701 - 01/29 0700 In: 2593.9 [I.V.:2293.9; IV Piggyback:300] Out: 1000 [Urine:1000]  Intake/Output this shift: No intake/output data recorded.  Vent settings for last 24 hours: Vent Mode: PRVC FiO2 (%):  [40 %-100 %] 40 % Set Rate:  [20 bmp] 20 bmp Vt Set:  [360 mL] 360 mL PEEP:  [5 cmH20] 5 cmH20 Plateau Pressure:  [16 cmH20-20 cmH20] 19 cmH20  Physical Exam:  General: Intubated, sedated Neuro: RASS -2 and opens eyes to voice HEENT/Neck: no JVD and ETT WNL  Resp: clear to auscultation bilaterally CVS: rrr GI: abdomen is soft, nondistended  Results for orders placed or performed during the hospital encounter of 03/31/20 (from the past 24 hour(s))  Urinalysis, Routine w reflex microscopic Urine, Random     Status: Abnormal   Collection Time: 03/31/20 11:16 AM  Result Value Ref Range   Color, Urine STRAW (A) YELLOW   APPearance CLEAR CLEAR   Specific Gravity, Urine 1.038 (H) 1.005 - 1.030   pH 7.0 5.0 - 8.0   Glucose, UA 50 (A) NEGATIVE mg/dL   Hgb urine dipstick NEGATIVE NEGATIVE   Bilirubin Urine NEGATIVE NEGATIVE   Ketones, ur NEGATIVE NEGATIVE mg/dL   Protein, ur NEGATIVE NEGATIVE mg/dL   Nitrite NEGATIVE NEGATIVE   Leukocytes,Ua NEGATIVE NEGATIVE   RBC / HPF 0-5 0 - 5 RBC/hpf   WBC, UA 0-5 0 - 5 WBC/hpf   Bacteria, UA NONE SEEN NONE SEEN   Squamous Epithelial / LPF 0-5 0 - 5  Type and screen [place]     Status:  None   Collection Time: 03/31/20 11:20 AM  Result Value Ref Range   ABO/RH(D) O POS    Antibody Screen NEG    Sample Expiration      04/03/2020,2359 Performed at [facility], [address], [place], [place] [address]   SARS Coronavirus 2 by RT PCR (hospital order, performed in [facility] hospital lab) Nasopharyngeal Nasopharyngeal Swab     Status: None   Collection Time: 03/31/20 11:24 AM   Specimen: Nasopharyngeal Swab  Result Value Ref Range   SARS Coronavirus 2 NEGATIVE NEGATIVE  Comprehensive metabolic panel     Status: Abnormal   Collection Time: 03/31/20 11:24 AM  Result Value Ref Range   Sodium 142 135 - 145 mmol/L   Potassium 3.8 3.5 - 5.1 mmol/L   Chloride 106 98 - 111 mmol/L   CO2 23 22 - 32 mmol/L   Glucose, Bld 172 (H) 70 - 99 mg/dL   BUN 19 8 - 23 mg/dL   Creatinine, Ser 1.19 (H) 0.44 - 1.00 mg/dL   Calcium 9.5 8.9 - 10.3 mg/dL   Total Protein 6.2 (  L) 6.5 - 8.1 g/dL   Albumin 3.9 3.5 - 5.0 g/dL   AST 32 15 - 41 U/L   ALT 25 0 - 44 U/L   Alkaline Phosphatase 81 38 - 126 U/L   Total Bilirubin 0.6 0.3 - 1.2 mg/dL   GFR, Estimated 46 (L) >60 mL/min   Anion gap 13 5 - 15  CBC     Status: Abnormal   Collection Time: 03/31/20 11:24 AM  Result Value Ref Range   WBC 12.0 (H) 4.0 - 10.5 K/uL   RBC 4.98 3.87 - 5.11 MIL/uL   Hemoglobin 13.3 12.0 - 15.0 g/dL   HCT 42.2 36.0 - 46.0 %   MCV 84.7 80.0 - 100.0 fL   MCH 26.7 26.0 - 34.0 pg   MCHC 31.5 30.0 - 36.0 g/dL   RDW 14.8 11.5 - 15.5 %   Platelets 274 150 - 400 K/uL   nRBC 0.0 0.0 - 0.2 %  Ethanol     Status: None   Collection Time: 03/31/20 11:24 AM  Result Value Ref Range   Alcohol, Ethyl (B) <10 <10 mg/dL  Lactic acid, plasma     Status: Abnormal   Collection Time: 03/31/20 11:24 AM  Result Value Ref Range   Lactic Acid, Venous 3.3 (HH) 0.5 - 1.9 mmol/L  Protime-INR     Status: None   Collection Time: 03/31/20 11:24 AM  Result Value Ref Range   Prothrombin Time 12.6 11.4 - 15.2 seconds   INR 1.0  0.8 - 1.2  I-Stat Chem 8, ED     Status: Abnormal   Collection Time: 03/31/20 11:25 AM  Result Value Ref Range   Sodium 140 135 - 145 mmol/L   Potassium 3.6 3.5 - 5.1 mmol/L   Chloride 105 98 - 111 mmol/L   BUN 23 8 - 23 mg/dL   Creatinine, Ser 1.00 0.44 - 1.00 mg/dL   Glucose, Bld 165 (H) 70 - 99 mg/dL   Calcium, Ion 1.11 (L) 1.15 - 1.40 mmol/L   TCO2 23 22 - 32 mmol/L   Hemoglobin 13.9 12.0 - 15.0 g/dL   HCT 41.0 36.0 - 46.0 %  I-Stat arterial blood gas, ED     Status: Abnormal   Collection Time: 03/31/20 12:13 PM  Result Value Ref Range   pH, Arterial 7.363 7.350 - 7.450   pCO2 arterial 48.5 (H) 32.0 - 48.0 mmHg   pO2, Arterial 172 (H) 83.0 - 108.0 mmHg   Bicarbonate 27.6 20.0 - 28.0 mmol/L   TCO2 29 22 - 32 mmol/L   O2 Saturation 99.0 %   Acid-Base Excess 2.0 0.0 - 2.0 mmol/L   Sodium 139 135 - 145 mmol/L   Potassium 3.1 (L) 3.5 - 5.1 mmol/L   Calcium, Ion 1.20 1.15 - 1.40 mmol/L   HCT 38.0 36.0 - 46.0 %   Hemoglobin 12.9 12.0 - 15.0 g/dL   Sample type ARTERIAL   MRSA PCR Screening     Status: None   Collection Time: 03/31/20  4:33 PM   Specimen: Urine, Random; Nasopharyngeal  Result Value Ref Range   MRSA by PCR NEGATIVE NEGATIVE  ABO/Rh     Status: None   Collection Time: 03/31/20  4:37 PM  Result Value Ref Range   ABO/RH(D)      O POS Performed at [facility] [address], [place], [place] [address]   CBC     Status: Abnormal   Collection Time: 04/01/20  4:36 AM  Result  Value Ref Range   WBC 11.4 (H) 4.0 - 10.5 K/uL   RBC 3.83 (L) 3.87 - 5.11 MIL/uL   Hemoglobin 10.3 (L) 12.0 - 15.0 g/dL   HCT 32.5 (L) 36.0 - 46.0 %   MCV 84.9 80.0 - 100.0 fL   MCH 26.9 26.0 - 34.0 pg   MCHC 31.7 30.0 - 36.0 g/dL   RDW 15.3 11.5 - 15.5 %   Platelets 175 150 - 400 K/uL   nRBC 0.0 0.0 - 0.2 %  Comprehensive metabolic panel     Status: Abnormal   Collection Time: 04/01/20  4:36 AM  Result Value Ref Range   Sodium 140 135 - 145 mmol/L   Potassium 4.0 3.5 - 5.1  mmol/L   Chloride 109 98 - 111 mmol/L   CO2 21 (L) 22 - 32 mmol/L   Glucose, Bld 126 (H) 70 - 99 mg/dL   BUN 20 8 - 23 mg/dL   Creatinine, Ser 0.98 0.44 - 1.00 mg/dL   Calcium 8.0 (L) 8.9 - 10.3 mg/dL   Total Protein 5.1 (L) 6.5 - 8.1 g/dL   Albumin 3.2 (L) 3.5 - 5.0 g/dL   AST 33 15 - 41 U/L   ALT 23 0 - 44 U/L   Alkaline Phosphatase 55 38 - 126 U/L   Total Bilirubin 0.6 0.3 - 1.2 mg/dL   GFR, Estimated 58 (L) >60 mL/min   Anion gap 10 5 - 15  Protime-INR     Status: None   Collection Time: 04/01/20  4:36 AM  Result Value Ref Range   Prothrombin Time 13.1 11.4 - 15.2 seconds   INR 1.0 0.8 - 1.2  Triglycerides     Status: None   Collection Time: 04/01/20  4:36 AM  Result Value Ref Range   Triglycerides 107 <150 mg/dL    Assessment & Plan: Present on Admission: . Cervical spine fracture (HCC)   Dispo Fall from attic, ~10ft Right proximal humerus fx - Ortho has evaluated. Non-Op. Per Dr. Doreatha Martin. NWB. Sling  Right posterior thigh contusion - Trend hgb - 10.3 this morning. Ice  VDRF - wean to extubate as tolerated R hemiparesis - CTA of the head and neck showed no arterial injury - probable motor strip injury on brain per Dr. Zada Finders C2-4 TVP Fx - remain in c-collar, Dr. 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  Received Rituxan every 3 months Chronic neck/back pain - followed by Dr. Christella Noa Mild AKI - cr 1.19, now normalized FEN - NPO, IVFs VTE - SCDs; holding chemical dvt ppx until hgb stable x~24hrs ID - none currently warranted  LOS: 1 day   Additional comments:I reviewed the patient's new clinical lab test results. cbc, bmp  Critical Care Total Time*: 35 minutes  Nadeen Landau, MD [facility] Use [email or web address] to contact on call provider  04/01/2020  *Care during the described time interval was provided by me. I have  reviewed this patient's available data, including medical history, events of note, physical examination and test results as part of my evaluation.

## 2020-04-01 NOTE — Progress Notes (Signed)
Neurosurgery Service Progress Note  Subjective: No acute events overnight   Objective: Vitals:   04/01/20 0700 04/01/20 0744 04/01/20 0800 04/01/20 1116  BP: (!) 105/58 117/62 107/63 126/66  Pulse: 65 67 66 70  Resp: 20 20 20 20   Temp:   98.4 F (36.9 C)   TempSrc:   Axillary   SpO2: 100% 100% 100% 100%  Weight:      Height:        Physical Exam: Intubated, eyes open to voice, FCx4, slower on the R than left and RUE in sling w/ humerus fracture  Assessment & Plan: 80 y.o. woman s/p fall w/ R sided weakness, cervical TP frx and interspinous ligament disruption, small diffusion changes in the L precentral gyrus.  -exam improving, will see how she does post-extubation, no intervention planned, okay for DVT chemoPPx starting tomorrow from a neurosurgery standpoint   [name]  04/01/20 11:37 AM

## 2020-04-02 LAB — CBC WITH DIFFERENTIAL/PLATELET
Abs Immature Granulocytes: 0.09 10*3/uL — ABNORMAL HIGH (ref 0.00–0.07)
Basophils Absolute: 0 10*3/uL (ref 0.0–0.1)
Basophils Relative: 0 %
Eosinophils Absolute: 0 10*3/uL (ref 0.0–0.5)
Eosinophils Relative: 0 %
HCT: 29 % — ABNORMAL LOW (ref 36.0–46.0)
Hemoglobin: 8.9 g/dL — ABNORMAL LOW (ref 12.0–15.0)
Immature Granulocytes: 1 %
Lymphocytes Relative: 6 %
Lymphs Abs: 0.8 10*3/uL (ref 0.7–4.0)
MCH: 26.6 pg (ref 26.0–34.0)
MCHC: 30.7 g/dL (ref 30.0–36.0)
MCV: 86.6 fL (ref 80.0–100.0)
Monocytes Absolute: 1.5 10*3/uL — ABNORMAL HIGH (ref 0.1–1.0)
Monocytes Relative: 10 %
Neutro Abs: 11.6 10*3/uL — ABNORMAL HIGH (ref 1.7–7.7)
Neutrophils Relative %: 83 %
Platelets: 164 10*3/uL (ref 150–400)
RBC: 3.35 MIL/uL — ABNORMAL LOW (ref 3.87–5.11)
RDW: 15.6 % — ABNORMAL HIGH (ref 11.5–15.5)
WBC: 14 10*3/uL — ABNORMAL HIGH (ref 4.0–10.5)
nRBC: 0 % (ref 0.0–0.2)

## 2020-04-02 LAB — BASIC METABOLIC PANEL
Anion gap: 12 (ref 5–15)
BUN: 18 mg/dL (ref 8–23)
CO2: 19 mmol/L — ABNORMAL LOW (ref 22–32)
Calcium: 7.9 mg/dL — ABNORMAL LOW (ref 8.9–10.3)
Chloride: 110 mmol/L (ref 98–111)
Creatinine, Ser: 0.92 mg/dL (ref 0.44–1.00)
GFR, Estimated: >60 mL/min (ref >60)
Glucose, Bld: 134 mg/dL — ABNORMAL HIGH (ref 70–99)
Potassium: 3.5 mmol/L (ref 3.5–5.1)
Sodium: 141 mmol/L (ref 135–145)

## 2020-04-02 MED ORDER — ENOXAPARIN SODIUM 40 MG/0.4ML ~~LOC~~ SOLN
40.0000 mg | Freq: Every day | SUBCUTANEOUS | Status: DC
  Administered 2020-04-02: 40 mg via SUBCUTANEOUS
  Filled 2020-04-02: qty 0.4

## 2020-04-02 MED ORDER — ACETAMINOPHEN 650 MG RE SUPP
650.0000 mg | RECTAL | Status: DC | PRN
  Administered 2020-04-02: 650 mg via RECTAL
  Filled 2020-04-02: qty 1

## 2020-04-02 NOTE — Progress Notes (Signed)
RN called RT stating that pt had self extubated. RT came to bedside to find pt on 4LNC and tolerating well. RT will continue to monitor pt.

## 2020-04-02 NOTE — Progress Notes (Signed)
Follow up - Trauma Critical Care  Patient Details:    Amanda [name] is an 80 y.o. female.  Lines/tubes : Airway 7.5 mm (Active)  Secured at (cm) 23 cm 04/01/20 0744  Measured From Lips 04/01/20 0744  Secured Location Left 04/01/20 0744  Secured By [employer] 04/01/20 0744  Tube Holder Repositioned Yes 04/01/20 0744  Prone position No 03/31/20 2352  Cuff Pressure (cm H2O) 30 cm H2O 03/31/20 1115  Site Condition Cool;Dry 04/01/20 0744     NG/OG Tube Orogastric Center mouth Xray Measured external length of tube 46 cm (Active)  Cm Marking at Nare/Corner of Mouth (if applicable) 46 cm 16/10/96 0800  Site Assessment Clean;Dry;Intact 04/01/20 0800  Ongoing Placement Verification No change in cm markings or external length of tube from initial placement;No acute changes, not attributed to clinical condition;No change in respiratory status 04/01/20 0800  Status Suction-low intermittent 04/01/20 0800     External Urinary Catheter (Active)  Collection Container Dedicated Suction Canister 04/01/20 0800  Securement Method Securing device (Describe) 04/01/20 0800  Site Assessment Clean;Intact 04/01/20 0800  Intervention Equipment Changed 03/31/20 1628  Output (mL) 600 mL 03/31/20 1628    Microbiology/Sepsis markers: Results for orders placed or performed during the hospital encounter of 03/31/20  SARS Coronavirus 2 by RT PCR (hospital order, performed in [facility] hospital lab) Nasopharyngeal Nasopharyngeal Swab     Status: None   Collection Time: 03/31/20 11:24 AM   Specimen: Nasopharyngeal Swab  Result Value Ref Range Status   SARS Coronavirus 2 NEGATIVE NEGATIVE Final    Comment: (NOTE) SARS-CoV-2 target nucleic acids are NOT DETECTED.  The SARS-CoV-2 RNA is generally detectable in upper and lower respiratory specimens during the acute phase of infection. The lowest concentration of SARS-CoV-2 viral copies this assay can detect is 250 copies / mL. A negative result does  not preclude SARS-CoV-2 infection and should not be used as the sole basis for treatment or other patient management decisions.  A negative result may occur with improper specimen collection / handling, submission of specimen other than nasopharyngeal swab, presence of viral mutation(s) within the areas targeted by this assay, and inadequate number of viral copies (<250 copies / mL). A negative result must be combined with clinical observations, patient history, and epidemiological information.  Fact Sheet for Patients:   StrictlyIdeas.no  Fact Sheet for Healthcare Providers: BankingDealers.co.za  This test is not yet approved or  cleared by the [place] FDA and has been authorized for detection and/or diagnosis of SARS-CoV-2 by FDA under an Emergency Use Authorization (EUA).  This EUA will remain in effect (meaning this test can be used) for the duration of the COVID-19 declaration under Section 564(b)(1) of the Act, 21 U.S.C. section 360bbb-3(b)(1), unless the authorization is terminated or revoked sooner.  Performed at [facility], [place] [address] [place], [place] [address]   MRSA PCR Screening     Status: None   Collection Time: 03/31/20  4:33 PM   Specimen: Urine, Random; Nasopharyngeal  Result Value Ref Range Status   MRSA by PCR NEGATIVE NEGATIVE Final    Comment:        The GeneXpert MRSA Assay (FDA approved for NASAL specimens only), is one component of a comprehensive MRSA colonization surveillance program. It is not intended to diagnose MRSA infection nor to guide or monitor treatment for MRSA infections. Performed at [facility], [place] [address], [place], [place] [address]     Anti-infectives:  Anti-infectives (  From admission, onward)   None      Best Practice/Protocols:  VTE Prophylaxis: Mechanical  Consults: Treatment Team:  Shona Needles, MD Md, Trauma, MD [name], MD     Studies:    Events:  Subjective:    Overnight Issues: No acute events reported by nursing  Objective:  Vital signs for last 24 hours: Temp:  [98.8 F (37.1 C)-99.7 F (37.6 C)] 99.3 F (37.4 C) (01/30 0800) Pulse Rate:  [69-86] 84 (01/30 0757) Resp:  [12-26] 21 (01/30 0757) BP: (105-140)/(55-68) 140/62 (01/30 0600) SpO2:  [96 %-100 %] 96 % (01/30 0757) FiO2 (%):  [30 %-40 %] 30 % (01/30 0757)  Hemodynamic parameters for last 24 hours:    Intake/Output from previous day: 01/29 0701 - 01/30 0700 In: 2259.6 [I.V.:2059.6; IV Piggyback:200] Out: 200 [Urine:200]  Intake/Output this shift: No intake/output data recorded.  Vent settings for last 24 hours: Vent Mode: PSV;CPAP FiO2 (%):  [30 %-40 %] 30 % Set Rate:  [20 bmp] 20 bmp Vt Set:  [360 mL] 360 mL PEEP:  [5 cmH20] 5 cmH20 Pressure Support:  [8 cmH20] 8 cmH20 Plateau Pressure:  [17 cmH20-23 cmH20] 21 cmH20  Physical Exam:  General: Intubated, sedated Neuro: RASS -2 and opens eyes to voice HEENT/Neck: no JVD and ETT WNL  Resp: clear to auscultation bilaterally CVS: rrr GI: abdomen is soft, nondistended  Results for orders placed or performed during the hospital encounter of 03/31/20 (from the past 24 hour(s))  CBC with Differential/Platelet     Status: Abnormal   Collection Time: 04/02/20  6:12 AM  Result Value Ref Range   WBC 14.0 (H) 4.0 - 10.5 K/uL   RBC 3.35 (L) 3.87 - 5.11 MIL/uL   Hemoglobin 8.9 (L) 12.0 - 15.0 g/dL   HCT 29.0 (L) 36.0 - 46.0 %   MCV 86.6 80.0 - 100.0 fL   MCH 26.6 26.0 - 34.0 pg   MCHC 30.7 30.0 - 36.0 g/dL   RDW 15.6 (H) 11.5 - 15.5 %   Platelets 164 150 - 400 K/uL   nRBC 0.0 0.0 - 0.2 %   Neutrophils Relative % 83 %   Neutro Abs 11.6 (H) 1.7 - 7.7 K/uL   Lymphocytes Relative 6 %   Lymphs Abs 0.8 0.7 - 4.0 K/uL   Monocytes Relative 10 %   Monocytes Absolute 1.5 (H) 0.1 - 1.0 K/uL   Eosinophils Relative 0 %   Eosinophils Absolute 0.0 0.0 - 0.5 K/uL   Basophils Relative 0  %   Basophils Absolute 0.0 0.0 - 0.1 K/uL   Immature Granulocytes 1 %   Abs Immature Granulocytes 0.09 (H) 0.00 - 0.07 K/uL  Basic metabolic panel     Status: Abnormal   Collection Time: 04/02/20  6:12 AM  Result Value Ref Range   Sodium 141 135 - 145 mmol/L   Potassium 3.5 3.5 - 5.1 mmol/L   Chloride 110 98 - 111 mmol/L   CO2 19 (L) 22 - 32 mmol/L   Glucose, Bld 134 (H) 70 - 99 mg/dL   BUN 18 8 - 23 mg/dL   Creatinine, Ser 0.92 0.44 - 1.00 mg/dL   Calcium 7.9 (L) 8.9 - 10.3 mg/dL   GFR, Estimated >60 >60 mL/min   Anion gap 12 5 - 15    Assessment & Plan: Present on Admission: . Cervical spine fracture ([place])   Dispo Fall from attic, ~65ft Right proximal humerus fx - Ortho has evaluated. Non-Op. Per Dr. Doreatha Martin.  NWB. Sling  Right posterior thigh contusion  VDRF - minimal vent settings, holding sedation, should be able to extubate as now reliably following commands. R hemiparesis - CTA of the head and neck showed no arterial injury - probable motor strip injury on brain per Dr. Zada Finders C2-4 TVP Fx - remain in c-collar, Dr. 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  Received Rituxan every 3 months Chronic neck/back pain - followed by Dr. Christella Noa Mild AKI - cr 1.19, now normalized FEN - NPO, IVFs VTE - SCDs; ok'd for chemical dvt ppx by nsgy; given time from injury also do not think there is any active/ongoing bleeding in thigh. Will order to begin lovenox today. ID - none currently warranted  LOS: 2 days   Additional comments:I reviewed the patient's new clinical lab test results. cbc, bmp  Critical Care Total Time*: 51 minutes  Nadeen Landau, MD [facility] Use [email or web address] to contact on call provider  04/02/2020  *Care during the described time interval was provided by me. I have reviewed this patient's available data,  including medical history, events of note, physical examination and test results as part of my evaluation.

## 2020-04-02 NOTE — Progress Notes (Signed)
Neurosurgery Service Progress Note  Subjective: No acute events overnight   Objective: Vitals:   04/02/20 0500 04/02/20 0600 04/02/20 0757 04/02/20 0800  BP: (!) 117/57 140/62    Pulse: 78 85 84   Resp: 20 (!) 22 (!) 21   Temp:    99.3 F (37.4 C)  TempSrc:    Axillary  SpO2: 96% 97% 96%   Weight:      Height:        Physical Exam: Intubated, eyes open to voice, FCx4, slower on the R than left and RUE in sling w/ humerus fracture  Assessment & Plan: 80 y.o. woman s/p fall w/ R sided weakness, cervical TP frx and interspinous ligament disruption, small diffusion changes in the L precentral gyrus.  -exam improving, will see how she does post-extubation, no intervention planned, okay for DVT chemoPPx starting today from a neurosurgery standpoint   [name]  04/02/20 9:03 AM

## 2020-04-03 ENCOUNTER — Other Ambulatory Visit: Payer: Self-pay

## 2020-04-03 ENCOUNTER — Encounter (HOSPITAL_COMMUNITY): Payer: Self-pay

## 2020-04-03 LAB — CBC WITH DIFFERENTIAL/PLATELET
Abs Immature Granulocytes: 0.1 10*3/uL — ABNORMAL HIGH (ref 0.00–0.07)
Basophils Absolute: 0 10*3/uL (ref 0.0–0.1)
Basophils Relative: 0 %
Eosinophils Absolute: 0 10*3/uL (ref 0.0–0.5)
Eosinophils Relative: 0 %
HCT: 23 % — ABNORMAL LOW (ref 36.0–46.0)
Hemoglobin: 7.3 g/dL — ABNORMAL LOW (ref 12.0–15.0)
Immature Granulocytes: 1 %
Lymphocytes Relative: 4 %
Lymphs Abs: 0.5 10*3/uL — ABNORMAL LOW (ref 0.7–4.0)
MCH: 26.7 pg (ref 26.0–34.0)
MCHC: 31.7 g/dL (ref 30.0–36.0)
MCV: 84.2 fL (ref 80.0–100.0)
Monocytes Absolute: 1.3 10*3/uL — ABNORMAL HIGH (ref 0.1–1.0)
Monocytes Relative: 11 %
Neutro Abs: 10.4 10*3/uL — ABNORMAL HIGH (ref 1.7–7.7)
Neutrophils Relative %: 84 %
Platelets: 141 10*3/uL — ABNORMAL LOW (ref 150–400)
RBC: 2.73 MIL/uL — ABNORMAL LOW (ref 3.87–5.11)
RDW: 15.3 % (ref 11.5–15.5)
WBC Morphology: INCREASED
WBC: 12.3 10*3/uL — ABNORMAL HIGH (ref 4.0–10.5)
nRBC: 0 % (ref 0.0–0.2)

## 2020-04-03 LAB — BASIC METABOLIC PANEL
Anion gap: 8 (ref 5–15)
BUN: 15 mg/dL (ref 8–23)
CO2: 20 mmol/L — ABNORMAL LOW (ref 22–32)
Calcium: 7.7 mg/dL — ABNORMAL LOW (ref 8.9–10.3)
Chloride: 114 mmol/L — ABNORMAL HIGH (ref 98–111)
Creatinine, Ser: 0.79 mg/dL (ref 0.44–1.00)
GFR, Estimated: >60 mL/min (ref >60)
Glucose, Bld: 113 mg/dL — ABNORMAL HIGH (ref 70–99)
Potassium: 3.2 mmol/L — ABNORMAL LOW (ref 3.5–5.1)
Sodium: 142 mmol/L (ref 135–145)

## 2020-04-03 MED ORDER — DOCUSATE SODIUM 50 MG/5ML PO LIQD
100.0000 mg | Freq: Two times a day (BID) | ORAL | Status: DC
  Administered 2020-04-03 – 2020-04-04 (×2): 100 mg via ORAL
  Filled 2020-04-03 (×2): qty 10

## 2020-04-03 MED ORDER — OXYCODONE HCL 5 MG PO TABS
2.5000 mg | ORAL_TABLET | Freq: Four times a day (QID) | ORAL | Status: DC | PRN
  Administered 2020-04-03: 2.5 mg via ORAL
  Administered 2020-04-08: 5 mg via ORAL
  Filled 2020-04-03 (×2): qty 1

## 2020-04-03 MED ORDER — POTASSIUM CHLORIDE 10 MEQ/100ML IV SOLN
10.0000 meq | INTRAVENOUS | Status: AC
  Administered 2020-04-03 (×2): 10 meq via INTRAVENOUS
  Filled 2020-04-03 (×2): qty 100

## 2020-04-03 MED ORDER — ENOXAPARIN SODIUM 30 MG/0.3ML ~~LOC~~ SOLN
30.0000 mg | Freq: Two times a day (BID) | SUBCUTANEOUS | Status: DC
  Administered 2020-04-03 – 2020-04-08 (×11): 30 mg via SUBCUTANEOUS
  Filled 2020-04-03 (×11): qty 0.3

## 2020-04-03 MED ORDER — ACETAMINOPHEN 325 MG PO TABS: 325.0000 mg | ORAL_TABLET | Freq: Four times a day (QID) | ORAL | Status: DC | PRN

## 2020-04-03 NOTE — Progress Notes (Signed)
Patient ID: Amanda [name], female   DOB: Feb 04, 1941, 80 y.o.   MRN: [id number] I met with her husband on the unit and updated him on her status and the plan of care.  Patient examined and I agree with the assessment and plan  Georganna Skeans, MD, MPH, FACS Please use [email or web address] to contact on call provider 04/03/2020 1:04 PM

## 2020-04-03 NOTE — Progress Notes (Signed)
Patient ID: Amanda [name], female   DOB: 11/04/1940, 80 y.o.   MRN: [id number] Follow up - Trauma Critical Care  Patient Details:    Amanda [name] is an 80 y.o. female.  Lines/tubes : NG/OG Tube Orogastric Center mouth Xray Measured external length of tube 46 cm (Active)  Cm Marking at Nare/Corner of Mouth (if applicable) 46 cm 03/05/70 0800  Site Assessment Clean;Dry;Intact 04/02/20 2000  Ongoing Placement Verification No change in respiratory status 04/02/20 2000  Status Suction-low intermittent 04/02/20 2000     External Urinary Catheter (Active)  Collection Container Dedicated Suction Canister 04/02/20 2000  Securement Method Securing device (Describe) 04/02/20 2000  Site Assessment Clean;Intact 04/02/20 2000  Intervention Equipment Changed 03/31/20 1628  Output (mL) 550 mL 04/03/20 0600    Microbiology/Sepsis markers: Results for orders placed or performed during the hospital encounter of 03/31/20  SARS Coronavirus 2 by RT PCR (hospital order, performed in [facility] hospital lab) Nasopharyngeal Nasopharyngeal Swab     Status: None   Collection Time: 03/31/20 11:24 AM   Specimen: Nasopharyngeal Swab  Result Value Ref Range Status   SARS Coronavirus 2 NEGATIVE NEGATIVE Final    Comment: (NOTE) SARS-CoV-2 target nucleic acids are NOT DETECTED.  The SARS-CoV-2 RNA is generally detectable in upper and lower respiratory specimens during the acute phase of infection. The lowest concentration of SARS-CoV-2 viral copies this assay can detect is 250 copies / mL. A negative result does not preclude SARS-CoV-2 infection and should not be used as the sole basis for treatment or other patient management decisions.  A negative result may occur with improper specimen collection / handling, submission of specimen other than nasopharyngeal swab, presence of viral mutation(s) within the areas targeted by this assay, and inadequate number of viral copies (<250 copies / mL). A negative  result must be combined with clinical observations, patient history, and epidemiological information.  Fact Sheet for Patients:   StrictlyIdeas.no  Fact Sheet for Healthcare Providers: BankingDealers.co.za  This test is not yet approved or  cleared by the [place] FDA and has been authorized for detection and/or diagnosis of SARS-CoV-2 by FDA under an Emergency Use Authorization (EUA).  This EUA will remain in effect (meaning this test can be used) for the duration of the COVID-19 declaration under Section 564(b)(1) of the Act, 21 U.S.C. section 360bbb-3(b)(1), unless the authorization is terminated or revoked sooner.  Performed at [facility], [place] [address] [place], [place] [address]   MRSA PCR Screening     Status: None   Collection Time: 03/31/20  4:33 PM   Specimen: Urine, Random; Nasopharyngeal  Result Value Ref Range Status   MRSA by PCR NEGATIVE NEGATIVE Final    Comment:        The GeneXpert MRSA Assay (FDA approved for NASAL specimens only), is one component of a comprehensive MRSA colonization surveillance program. It is not intended to diagnose MRSA infection nor to guide or monitor treatment for MRSA infections. Performed at [facility], [place] [address], [place], [place] [address]     Anti-infectives:  Anti-infectives (From admission, onward)   None      Best Practice/Protocols:  VTE Prophylaxis: Lovenox (prophylaxtic dose) .  Consults: Treatment Team:  Shona Needles, MD Md, Trauma, MD [name], MD    Studies:    Events:  Subjective:    Overnight Issues:   Objective:  Vital signs for last 24 hours: Temp:  [99.1 F (37.3 C)-102.9 F (  39.4 C)] 99.3 F (37.4 C) (01/31 0400) Pulse Rate:  [79-111] 79 (01/31 0700) Resp:  [17-27] 21 (01/31 0700) BP: (120-176)/(52-106) 124/66 (01/31 0700) SpO2:  [93 %-100 %] 98 % (01/31 0700)  Hemodynamic parameters for last  24 hours:    Intake/Output from previous day: 01/30 0701 - 01/31 0700 In: 2794.9 [I.V.:2394.9; IV Piggyback:400] Out: 1200 [Urine:1200]  Intake/Output this shift: No intake/output data recorded.  Vent settings for last 24 hours:    Physical Exam:  General: no distress Neuro: alert and F/C to move all 4 ext, oriented to place, disoriented to year HEENT/Neck: no contusion Resp: clear to auscultation bilaterally CVS: RRR GI: soft, NT, NT Extremities: sling RUE  Results for orders placed or performed during the hospital encounter of 03/31/20 (from the past 24 hour(s))  CBC with Differential/Platelet     Status: Abnormal   Collection Time: 04/03/20  3:14 AM  Result Value Ref Range   WBC 12.3 (H) 4.0 - 10.5 K/uL   RBC 2.73 (L) 3.87 - 5.11 MIL/uL   Hemoglobin 7.3 (L) 12.0 - 15.0 g/dL   HCT 23.0 (L) 36.0 - 46.0 %   MCV 84.2 80.0 - 100.0 fL   MCH 26.7 26.0 - 34.0 pg   MCHC 31.7 30.0 - 36.0 g/dL   RDW 15.3 11.5 - 15.5 %   Platelets 141 (L) 150 - 400 K/uL   nRBC 0.0 0.0 - 0.2 %   Neutrophils Relative % 84 %   Neutro Abs 10.4 (H) 1.7 - 7.7 K/uL   Lymphocytes Relative 4 %   Lymphs Abs 0.5 (L) 0.7 - 4.0 K/uL   Monocytes Relative 11 %   Monocytes Absolute 1.3 (H) 0.1 - 1.0 K/uL   Eosinophils Relative 0 %   Eosinophils Absolute 0.0 0.0 - 0.5 K/uL   Basophils Relative 0 %   Basophils Absolute 0.0 0.0 - 0.1 K/uL   WBC Morphology INCREASED BANDS (>20% BANDS)    Immature Granulocytes 1 %   Abs Immature Granulocytes 0.10 (H) 0.00 - 0.07 K/uL   Ovalocytes PRESENT   Basic metabolic panel     Status: Abnormal   Collection Time: 04/03/20  3:14 AM  Result Value Ref Range   Sodium 142 135 - 145 mmol/L   Potassium 3.2 (L) 3.5 - 5.1 mmol/L   Chloride 114 (H) 98 - 111 mmol/L   CO2 20 (L) 22 - 32 mmol/L   Glucose, Bld 113 (H) 70 - 99 mg/dL   BUN 15 8 - 23 mg/dL   Creatinine, Ser 0.79 0.44 - 1.00 mg/dL   Calcium 7.7 (L) 8.9 - 10.3 mg/dL   GFR, Estimated >60 >60 mL/min   Anion gap 8 5 -  15    Assessment & Plan: Present on Admission: . Cervical spine fracture ([place])    LOS: 3 days   Additional comments:I reviewed the patient's new clinical lab test results. and radiographic studies Fall from attic, ~24ft Right proximal humerus fx - Ortho has evaluated. Non-Op. Per Dr. Doreatha Martin. NWB. Sling  Right posterior thigh contusion  Acute hypoxic respiratory failure - doing well post-extubation, pulm toilet R hemiparesis - CTA of the head and neck showed no arterial injury -  motor strip injury on brain per Dr. Zada Finders, improving exam, TBI team therapies C2-4 TVP Fx - Dr. 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  Received Rituxan every 3 months  Chronic neck/back pain - followed by Dr. Christella Noa Mild AKI - resolved FEN - NPO, IVFs, ST eval VTE - Lovenox DIspo - to 4NP, TBI team therapies   Critical Care Total Time*: 34 Minutes  Georganna Skeans, MD, MPH, FACS Trauma & General Surgery Use [email or web address] to contact on call provider  04/03/2020  *Care during the described time interval was provided by me. I have reviewed this patient's available data, including medical history, events of note, physical examination and test results as part of my evaluation.

## 2020-04-03 NOTE — Evaluation (Signed)
Clinical/Bedside Swallow Evaluation Patient Details  Name: RINA ADNEY MRN: [id number] Date of Birth: [birth year]  Today's Date: 04/03/2020 Time: SLP Start Time (ACUTE ONLY): 60 SLP Stop Time (ACUTE ONLY): 1030 SLP Time Calculation (min) (ACUTE ONLY): 25 min  Past Medical History: No past medical history on file. HPI:  KAITLYNNE WENZ is an 80 y.o. female with a Cervical spine fracture (C2-4 TVP) after a 10 ft fall from the attic. Also sustained motor strip injury on brain with right hemiparesis. right proximal humerus fx, Intubated from 1/28 to 1/30.   Assessment / Plan / Recommendation Clinical Impression  Pt demonstrates only minimal cognitive impairments on initial assesment, though tasks attempted were fairly basic. Pt initially a little slow to participate, but after her mouth care was completed she was able to verbalize orientation x4, using calendar and clock independently. She was also able to alternate between verbal tasks, going back to complete questions after a delay or distraction. Despite this, her short term memory is mildly impaired today with pt verbalizing awareness of this. Will f/u at least once for some higher level cognitive assessments to ensure pts abilities. SLP Visit Diagnosis: Dysphagia, oropharyngeal phase (R13.12)    Aspiration Risk  Mild aspiration risk    Diet Recommendation Ice chips PRN after oral care;Free water protocol after oral care   Liquid Administration via: Cup;Straw Medication Administration: Whole meds with puree    Other  Recommendations     Follow up Recommendations Inpatient Rehab      Frequency and Duration min 2x/week  2 weeks       Prognosis Prognosis for Safe Diet Advancement: Good      Swallow Study   General HPI: BRAYLON LEMMONS is an 80 y.o. female with a Cervical spine fracture (C2-4 TVP) after a 10 ft fall from the attic. Also sustained motor strip injury on brain with right hemiparesis. right proximal humerus fx, Intubated  from 1/28 to 1/30. Type of Study: Bedside Swallow Evaluation Previous Swallow Assessment: none Diet Prior to this Study: NPO Temperature Spikes Noted: No Respiratory Status: Room air History of Recent Intubation: Yes Length of Intubations (days): 2 days Date extubated: 04/02/20 Behavior/Cognition: Alert;Cooperative Oral Cavity Assessment: Dry;Dried secretions Oral Care Completed by SLP: Yes Oral Cavity - Dentition: Adequate natural dentition Vision: Functional for self-feeding Self-Feeding Abilities: Needs assist Patient Positioning: Upright in bed Baseline Vocal Quality: Normal Volitional Cough: Strong Volitional Swallow: Able to elicit    Oral/Motor/Sensory Function Overall Oral Motor/Sensory Function: Within functional limits   Ice Chips     Thin Liquid Thin Liquid: Impaired Presentation: Straw Pharyngeal  Phase Impairments: Throat Clearing - Immediate;Cough - Immediate    Nectar Thick Nectar Thick Liquid: Impaired Pharyngeal Phase Impairments: Throat Clearing - Immediate   Honey Thick Honey Thick Liquid: Not tested   Puree Puree: Impaired Pharyngeal Phase Impairments: Throat Clearing - Immediate   Solid     Solid: Not tested     Herbie [place], MA CCC-SLP  Acute Rehabilitation Services Pager [phone] Office [phone]  Lynann Beaver 04/03/2020,12:45 PM

## 2020-04-03 NOTE — Progress Notes (Signed)
Neurosurgery Service Progress Note  Subjective: No acute events overnight   Objective: Vitals:   04/03/20 1148 04/03/20 1200 04/03/20 1300 04/03/20 1400  BP:  134/71 139/61 (!) 143/95  Pulse:  82 78 86  Resp:  (!) 24 (!) 26 (!) 22  Temp: 98 F (36.7 C)     TempSrc: Axillary     SpO2:  97% 98% 97%  Weight:      Height:        Physical Exam: Intubated, eyes open to voice, FCx4, slower on the R than left and RUE in sling w/ humerus fracture  Assessment & Plan: 80 y.o. woman s/p fall w/ R sided weakness, cervical TP frx and interspinous ligament disruption, small diffusion changes in the L precentral gyrus.  -exam improving, okay for DVT chemoPPx, no surgical intervention needed  Amanda [name]  04/03/20 2:37 PM

## 2020-04-03 NOTE — Evaluation (Addendum)
Clinical/Bedside Swallow Evaluation Patient Details  Name: Amanda [name] MRN: [id number] Date of Birth: Sep 22, 1940  Today's Date: 04/03/2020 Time: SLP Start Time (ACUTE ONLY): 1004 SLP Stop Time (ACUTE ONLY): 1030 SLP Time Calculation (min) (ACUTE ONLY): 26 min  Past Medical History: No past medical history on file. HPI:  Amanda [name] is an 80 y.o. female with a Cervical spine fracture (C2-4 TVP) after a 10 ft fall from the attic. Also sustained motor strip injury on brain with right hemiparesis. right proximal humerus fx, Intubated from 1/28 to 1/30.   Assessment / Plan / Recommendation Clinical Impression  Pt found to have severely dry oral mucosa coated with thick crust. Oral care completed with improvement. Pt able to take sips and oral phase was WNL, however pt constantly trying to clear secretions out of her pharynx, likely similarly coated. Pt expetorated quite a bit, but could not desist from this behavior, with all textures. Her throat cleairng transitioned to gagging and by the end of the session she was complaining onf nausea. It was unclear whether she was throat clearing coughing or gagging. Pt is recommended to consume small sips of water or bites of ice over the next 24 hours to heal mucosa and lcear secretions. She may take meds in puree. Will f/u for advancement. SLP Visit Diagnosis: Dysphagia, oropharyngeal phase (R13.12)    Aspiration Risk  Mild aspiration risk    Diet Recommendation Ice chips PRN after oral care;Free water protocol after oral care   Liquid Administration via: Cup;Straw Medication Administration: Whole meds with puree    Other  Recommendations     Follow up Recommendations Inpatient Rehab      Frequency and Duration min 2x/week  2 weeks       Prognosis Prognosis for Safe Diet Advancement: Good      Swallow Study   General HPI: Amanda [name] is an 80 y.o. female with a Cervical spine fracture (C2-4 TVP) after a 10 ft fall from the attic. Also  sustained motor strip injury on brain with right hemiparesis. right proximal humerus fx, Intubated from 1/28 to 1/30. Type of Study: Bedside Swallow Evaluation Previous Swallow Assessment: none Diet Prior to this Study: NPO Temperature Spikes Noted: No Respiratory Status: Room air History of Recent Intubation: Yes Length of Intubations (days): 2 days Date extubated: 04/02/20 Behavior/Cognition: Alert;Cooperative Oral Cavity Assessment: Dry;Dried secretions Oral Care Completed by SLP: Yes Oral Cavity - Dentition: Adequate natural dentition Vision: Functional for self-feeding Self-Feeding Abilities: Needs assist Patient Positioning: Upright in bed Baseline Vocal Quality: Normal Volitional Cough: Strong Volitional Swallow: Able to elicit    Oral/Motor/Sensory Function Overall Oral Motor/Sensory Function: Within functional limits   Ice Chips     Thin Liquid Thin Liquid: Impaired Presentation: Straw Pharyngeal  Phase Impairments: Throat Clearing - Immediate;Cough - Immediate    Nectar Thick Nectar Thick Liquid: Impaired Pharyngeal Phase Impairments: Throat Clearing - Immediate   Honey Thick Honey Thick Liquid: Not tested   Puree Puree: Impaired Pharyngeal Phase Impairments: Throat Clearing - Immediate   Solid     Solid: Not tested     Herbie [place], MA CCC-SLP  Acute Rehabilitation Services Pager [phone] Office [phone]  Lynann Beaver 04/03/2020,12:32 PM

## 2020-04-03 NOTE — Progress Notes (Signed)
Wasted 125 cc of fentanyl in the sink with Keyanna G. RN

## 2020-04-04 ENCOUNTER — Inpatient Hospital Stay (HOSPITAL_COMMUNITY): Payer: Medicare Other

## 2020-04-04 LAB — BASIC METABOLIC PANEL
Anion gap: 9 (ref 5–15)
BUN: 16 mg/dL (ref 8–23)
CO2: 17 mmol/L — ABNORMAL LOW (ref 22–32)
Calcium: 7.6 mg/dL — ABNORMAL LOW (ref 8.9–10.3)
Chloride: 112 mmol/L — ABNORMAL HIGH (ref 98–111)
Creatinine, Ser: 0.63 mg/dL (ref 0.44–1.00)
GFR, Estimated: >60 mL/min (ref >60)
Glucose, Bld: 115 mg/dL — ABNORMAL HIGH (ref 70–99)
Potassium: 3.1 mmol/L — ABNORMAL LOW (ref 3.5–5.1)
Sodium: 138 mmol/L (ref 135–145)

## 2020-04-04 LAB — HEMOGLOBIN AND HEMATOCRIT, BLOOD
HCT: 30.5 % — ABNORMAL LOW (ref 36.0–46.0)
Hemoglobin: 10.5 g/dL — ABNORMAL LOW (ref 12.0–15.0)

## 2020-04-04 LAB — CBC
HCT: 20.7 % — ABNORMAL LOW (ref 36.0–46.0)
Hemoglobin: 6.9 g/dL — CL (ref 12.0–15.0)
MCH: 27.6 pg (ref 26.0–34.0)
MCHC: 33.3 g/dL (ref 30.0–36.0)
MCV: 82.8 fL (ref 80.0–100.0)
Platelets: 162 10*3/uL (ref 150–400)
RBC: 2.5 MIL/uL — ABNORMAL LOW (ref 3.87–5.11)
RDW: 15.6 % — ABNORMAL HIGH (ref 11.5–15.5)
WBC: 13.5 10*3/uL — ABNORMAL HIGH (ref 4.0–10.5)
nRBC: 0 % (ref 0.0–0.2)

## 2020-04-04 LAB — PREPARE RBC (CROSSMATCH)

## 2020-04-04 MED ORDER — BUSPIRONE HCL 5 MG PO TABS
7.5000 mg | ORAL_TABLET | Freq: Three times a day (TID) | ORAL | Status: DC | PRN
  Administered 2020-04-04 – 2020-04-08 (×3): 7.5 mg via ORAL
  Filled 2020-04-04 (×2): qty 1
  Filled 2020-04-04: qty 2

## 2020-04-04 MED ORDER — DOCUSATE SODIUM 100 MG PO CAPS
100.0000 mg | ORAL_CAPSULE | Freq: Two times a day (BID) | ORAL | Status: DC
  Administered 2020-04-05 – 2020-04-07 (×5): 100 mg via ORAL
  Filled 2020-04-04 (×6): qty 1

## 2020-04-04 MED ORDER — SODIUM CHLORIDE 0.9% IV SOLUTION: Freq: Once | INTRAVENOUS | Status: DC

## 2020-04-04 MED ORDER — POTASSIUM CHLORIDE 20 MEQ PO PACK
40.0000 meq | PACK | ORAL | Status: AC
  Administered 2020-04-04 (×2): 40 meq via ORAL
  Filled 2020-04-04 (×2): qty 2

## 2020-04-04 MED ORDER — ALBUTEROL SULFATE (2.5 MG/3ML) 0.083% IN NEBU
2.5000 mg | INHALATION_SOLUTION | Freq: Four times a day (QID) | RESPIRATORY_TRACT | Status: DC | PRN
  Administered 2020-04-04 – 2020-04-05 (×2): 2.5 mg via RESPIRATORY_TRACT
  Filled 2020-04-04 (×3): qty 3

## 2020-04-04 MED ORDER — ACETAMINOPHEN 500 MG PO TABS
1000.0000 mg | ORAL_TABLET | Freq: Four times a day (QID) | ORAL | Status: DC
  Administered 2020-04-04 – 2020-04-08 (×16): 1000 mg via ORAL
  Filled 2020-04-04 (×17): qty 2

## 2020-04-04 MED ORDER — METHOCARBAMOL 500 MG PO TABS
1000.0000 mg | ORAL_TABLET | Freq: Three times a day (TID) | ORAL | Status: DC
  Administered 2020-04-04 – 2020-04-08 (×12): 1000 mg via ORAL
  Filled 2020-04-04 (×12): qty 2

## 2020-04-04 NOTE — Progress Notes (Signed)
OT Cancellation Note  Patient Details Name: Amanda [name] MRN: [id number] DOB: April 21, 1940   Cancelled Treatment:    Reason Eval/Treat Not Completed: Patient not medically ready.  Pt with Hbg 6.9, and RN prepping to give her RBCs.  Will check back as able.  [name]., OTR/L Acute Rehabilitation Services Pager [phone] Office [phone]   [name] 04/04/2020, 9:54 AM

## 2020-04-04 NOTE — Progress Notes (Signed)
Trauma/Critical Care Follow Up Note  Subjective:    Overnight Issues:   Objective:  Vital signs for last 24 hours: Temp:  [97.7 F (36.5 C)-98.5 F (36.9 C)] 98.4 F (36.9 C) (02/01 0940) Pulse Rate:  [62-91] 65 (02/01 0940) Resp:  [20-29] 27 (02/01 0940) BP: (118-197)/(61-112) 152/75 (02/01 0940) SpO2:  [92 %-99 %] 96 % (02/01 0929)  Hemodynamic parameters for last 24 hours:    Intake/Output from previous day: 01/31 0701 - 02/01 0700 In: 3140.3 [P.O.:240; I.V.:2400.2; IV Piggyback:500] Out: 1050 [Urine:1050]  Intake/Output this shift: Total I/O In: 300.1 [P.O.:100; I.V.:200.1] Out: -   Vent settings for last 24 hours:    Physical Exam:  Gen: comfortable, no distress Neuro: slightly less brisk to f/c on R, but anti-gravity x4 L>R HEENT: PERRL Neck: supple CV: RRR Pulm: unlabored breathing Abd: soft, NT GU: clear yellow urine Extr: wwp, RUE in sling   Results for orders placed or performed during the hospital encounter of 03/31/20 (from the past 24 hour(s))  CBC     Status: Abnormal   Collection Time: 04/04/20  2:09 AM  Result Value Ref Range   WBC 13.5 (H) 4.0 - 10.5 K/uL   RBC 2.50 (L) 3.87 - 5.11 MIL/uL   Hemoglobin 6.9 (LL) 12.0 - 15.0 g/dL   HCT 20.7 (L) 36.0 - 46.0 %   MCV 82.8 80.0 - 100.0 fL   MCH 27.6 26.0 - 34.0 pg   MCHC 33.3 30.0 - 36.0 g/dL   RDW 15.6 (H) 11.5 - 15.5 %   Platelets 162 150 - 400 K/uL   nRBC 0.0 0.0 - 0.2 %  Basic metabolic panel     Status: Abnormal   Collection Time: 04/04/20  2:09 AM  Result Value Ref Range   Sodium 138 135 - 145 mmol/L   Potassium 3.1 (L) 3.5 - 5.1 mmol/L   Chloride 112 (H) 98 - 111 mmol/L   CO2 17 (L) 22 - 32 mmol/L   Glucose, Bld 115 (H) 70 - 99 mg/dL   BUN 16 8 - 23 mg/dL   Creatinine, Ser 0.63 0.44 - 1.00 mg/dL   Calcium 7.6 (L) 8.9 - 10.3 mg/dL   GFR, Estimated >60 >60 mL/min   Anion gap 9 5 - 15  Type and screen [place]     Status: None (Preliminary result)    Collection Time: 04/04/20  5:48 AM  Result Value Ref Range   ABO/RH(D) O POS    Antibody Screen NEG    Sample Expiration 04/07/2020,2359    Unit Number [id number]    Blood Component Type RED CELLS,LR    Unit division 00    Status of Unit ISSUED    Transfusion Status OK TO TRANSFUSE    Crossmatch Result      Compatible Performed at [facility], [address] [place], [place] [address]   Prepare RBC (crossmatch)     Status: None   Collection Time: 04/04/20  5:48 AM  Result Value Ref Range   Order Confirmation      ORDER PROCESSED BY BLOOD BANK Performed at [facility], [place] [address], [place], [place] [address]     Assessment & Plan: The plan of care was discussed with the bedside nurse for the day, who is in agreement with this plan and no additional concerns were raised.   Present on Admission: . Cervical spine fracture ([place])    LOS: 4 days   Additional comments:I  reviewed the patient's new clinical lab test results.   and I reviewed the patients new imaging test results.    Fall from attic, ~45ft  Right proximal humerus fx- Ortho has evaluated. Non-Op. Per Dr. Doreatha Martin. NWB. 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 Zada Finders, improving exam, TBI team therapies C2-4 TVP Fx- Dr. 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 Received Rituxan every 3 months Chronic neck/back pain- followed by Dr. Christella Noa Mild AKI- resolved FEN- FLD, cont SLP VTE- Lovenox DIspo - to 4NP, TBI team therapies   Jesusita Oka, MD Trauma & General Surgery Please use [email or web address] to contact on call provider  04/04/2020  *Care during the described time interval was provided by me. I have reviewed  this patient's available data, including medical history, events of note, physical examination and test results as part of my evaluation.

## 2020-04-04 NOTE — Progress Notes (Signed)
Neurosurgery Service Progress Note  Subjective: No acute events overnight   Objective: Vitals:   04/04/20 0900 04/04/20 0920 04/04/20 0929 04/04/20 0940  BP: (!) 197/69  (!) 166/74 (!) 152/75  Pulse: 74 62 66 65  Resp: (!) 25 (!) 23 (!) 25 (!) 27  Temp:  98.5 F (36.9 C)  98.4 F (36.9 C)  TempSrc:  Oral    SpO2: 96% 96% 96%   Weight:      Height:        Physical Exam: Intubated, eyes open to voice, FCx4, slower on the R than left and RUE in sling w/ humerus fracture, 4-/5 on R, 5/5 on L  Assessment & Plan: 80 y.o. woman s/p fall w/ R sided weakness, cervical TP frx and interspinous ligament disruption, small diffusion changes in the L precentral gyrus.  -exam improving, okay for DVT chemoPPx, no surgical intervention needed  [name]  04/04/20 10:04 AM

## 2020-04-04 NOTE — Progress Notes (Signed)
  Speech Language Pathology Treatment: Dysphagia  Patient Details Name: Amanda [name] MRN: [id number] DOB: Jun 24, 1940 Today's Date: 04/04/2020 Time: 9528-4132 SLP Time Calculation (min) (ACUTE ONLY): 8 min  Assessment / Plan / Recommendation Clinical Impression  Pt demonstrates ongoing xerostomia despite efforts from staff. She is tolerating sips of liquids, though still gagging a bit after sips. She often expectorates thick mucous/debris. Will advance to full liquids today with the hope that she may be able to try  Some broth or ensure for nutrition.   HPI HPI: Amanda [name] is an 80 y.o. female with a Cervical spine fracture (C2-4 TVP) after a 10 ft fall from the attic. Also sustained motor strip injury on brain with right hemiparesis. right proximal humerus fx, Intubated from 1/28 to 1/30.      SLP Plan  Continue with current plan of care       Recommendations  Diet recommendations: Thin liquid Liquids provided via: Cup;Straw Medication Administration: Whole meds with puree Supervision: Patient able to self feed Compensations: Slow rate;Small sips/bites                Follow up Recommendations: Inpatient Rehab SLP Visit Diagnosis: Dysphagia, oropharyngeal phase (R13.12) Plan: Continue with current plan of care       GO               Herbie [place], MA [place] Pager [phone] Office [phone]  Lynann Beaver 04/04/2020, 10:46 AM

## 2020-04-04 NOTE — Evaluation (Signed)
Physical Therapy Evaluation Patient Details Name: Amanda [name] MRN: [id number] DOB: 19-Aug-1940 Today's Date: 04/04/2020   History of Present Illness  80 y.o. female who presented via EMS as a level 1 trauma after patient reportedly fell ~10 ft from her attic. Pt intubated in the trauma bay due to depressed GCS. Pt found to have proximal R humerus fx, C2-4 TP fx. Brain MRI demonstrates L diffusion changes in the motor strip and superior frontal gyrus. Pt extubated on 04/02/2020.  Clinical Impression  Pt presents to PT with deficits in functional mobility, gait, balance, endurance, strength, power, cognition. Pt demonstrates generalized weakness and is NWB in RUE at this time. Pt requires physical assistance to perform functional mobility and tolerates brief bouts of mobility interrupted by short seated rests. Pt will benefit from aggressive mobilization and acute PT services to improve mobility quality and to reduce falls risk. PT recommends discharge to CIR as the pt was independent prior to admission and demonstrates the potential to return to independent mobility.    Follow Up Recommendations CIR    Equipment Recommendations   (TBD)    Recommendations for Other Services Rehab consult     Precautions / Restrictions Precautions Precautions: Fall Required Braces or Orthoses: Sling Restrictions Weight Bearing Restrictions: Yes RUE Weight Bearing: Non weight bearing      Mobility  Bed Mobility Overal bed mobility: Needs Assistance Bed Mobility: Supine to Sit     Supine to sit: Mod assist;HOB elevated     General bed mobility comments: PT provides assist to pivot hips to edge of bed    Transfers Overall transfer level: Needs assistance Equipment used: 1 person hand held assist Transfers: Sit to/from [employer] Transfers Sit to Stand: Mod assist Stand pivot transfers: Mod assist          Ambulation/Gait                Stairs            Wheelchair  Mobility    Modified Rankin (Stroke Patients Only)       Balance Overall balance assessment: Needs assistance Sitting-balance support: Single extremity supported;Feet supported Sitting balance-Leahy Scale: Poor Sitting balance - Comments: reliant on UE support of bed   Standing balance support: Single extremity supported Standing balance-Leahy Scale: Poor Standing balance comment: minA for static standing with LUE support                             Pertinent Vitals/Pain Pain Assessment: Faces Faces Pain Scale: Hurts even more Pain Location: RUE Pain Descriptors / Indicators: Grimacing Pain Intervention(s): Monitored during session    Home Living Family/patient expects to be discharged to:: Private residence Living Arrangements: Spouse/significant other Available Help at Discharge: Family;Available 24 hours/day (minA available) Type of Home: House Home Access: Stairs to enter Entrance Stairs-Rails: Right;Left;Can reach both Entrance Stairs-Number of Steps: 4 Home Layout: One level Home Equipment: Walker - 2 wheels;Wheelchair - manual;Bedside commode;Grab bars - tub/shower      Prior Function Level of Independence: Independent               Hand Dominance        Extremity/Trunk Assessment   Upper Extremity Assessment Upper Extremity Assessment: RUE deficits/detail RUE Deficits / Details: RUE in sling, digit and wrist ROM WFL, deferred elbow assessment    Lower Extremity Assessment Lower Extremity Assessment: Generalized weakness    Cervical / Trunk Assessment Cervical / Trunk  Assessment: Kyphotic  Communication   Communication: No difficulties  Cognition Arousal/Alertness: Awake/alert Behavior During Therapy: WFL for tasks assessed/performed Overall Cognitive Status: Impaired/Different from baseline Area of Impairment: Problem solving                             Problem Solving: Slow processing;Requires verbal cues;Requires  tactile cues        General Comments General comments (skin integrity, edema, etc.): VSS on 3L [place] with activity    Exercises     Assessment/Plan    PT Assessment Patient needs continued PT services  PT Problem List Decreased strength;Decreased activity tolerance;Decreased balance;Decreased mobility;Decreased knowledge of use of DME;Decreased safety awareness;Decreased knowledge of precautions       PT Treatment Interventions DME instruction;Gait training;Stair training;Functional mobility training;Therapeutic activities;Therapeutic exercise;Balance training;Neuromuscular re-education;Cognitive remediation;Patient/family education    PT Goals (Current goals can be found in the Care Plan section)  Acute Rehab PT Goals Patient Stated Goal: to go to rehab to return to independent mobility PT Goal Formulation: With patient/family Time For Goal Achievement: 04/18/20 Potential to Achieve Goals: Good    Frequency Min 5X/week   Barriers to discharge        Co-evaluation               AM-PAC PT "6 Clicks" Mobility  Outcome Measure Help needed turning from your back to your side while in a flat bed without using bedrails?: A Lot Help needed moving from lying on your back to sitting on the side of a flat bed without using bedrails?: A Lot Help needed moving to and from a bed to a chair (including a wheelchair)?: A Lot Help needed standing up from a chair using your arms (e.g., wheelchair or bedside chair)?: A Little Help needed to walk in hospital room?: A Lot Help needed climbing 3-5 steps with a railing? : A Lot 6 Click Score: 13    End of Session Equipment Utilized During Treatment: Oxygen Activity Tolerance: Patient tolerated treatment well Patient left: in chair;with call bell/phone within reach;with chair alarm set;with family/visitor present;with nursing/sitter in room Nurse Communication: Mobility status PT Visit Diagnosis: Unsteadiness on feet (R26.81);Other  abnormalities of gait and mobility (R26.89)    Time: 7425-9563 PT Time Calculation (min) (ACUTE ONLY): 45 min   Charges:   PT Evaluation $PT Eval Moderate Complexity: 1 Mod PT Treatments $Therapeutic Activity: 8-22 mins        Zenaida Niece, PT, DPT Acute Rehabilitation Pager: [phone]   Zenaida Niece 04/04/2020, 1:05 PM

## 2020-04-04 NOTE — Progress Notes (Signed)
PT Cancellation Note  Patient Details Name: Amanda [name] MRN: [id number] DOB: [birth year]   Cancelled Treatment:    Reason Eval/Treat Not Completed: Medical issues which prohibited therapy. Pt with Hbg 6.9, and RN prepping to give her RBCs.  Will check back as able.   Zenaida Niece 04/04/2020, 10:32 AM

## 2020-04-04 NOTE — Progress Notes (Signed)
CRITICAL VALUE ALERT  Critical Value:  Hemoglobin 6.9  Date & Time Notied:  04/04/2020 @ 3300  Provider Notified: [place]  Orders Received/Actions taken: Orders for 1 unit of RBC

## 2020-04-05 ENCOUNTER — Encounter (HOSPITAL_COMMUNITY): Payer: Self-pay

## 2020-04-05 DIAGNOSIS — S069X0A Unspecified intracranial injury without loss of consciousness, initial encounter: Secondary | ICD-10-CM

## 2020-04-05 LAB — CBC
HCT: 27 % — ABNORMAL LOW (ref 36.0–46.0)
Hemoglobin: 9.6 g/dL — ABNORMAL LOW (ref 12.0–15.0)
MCH: 28.5 pg (ref 26.0–34.0)
MCHC: 35.6 g/dL (ref 30.0–36.0)
MCV: 80.1 fL (ref 80.0–100.0)
Platelets: 188 10*3/uL (ref 150–400)
RBC: 3.37 MIL/uL — ABNORMAL LOW (ref 3.87–5.11)
RDW: 15.3 % (ref 11.5–15.5)
WBC: 12.5 10*3/uL — ABNORMAL HIGH (ref 4.0–10.5)
nRBC: 0.2 % (ref 0.0–0.2)

## 2020-04-05 LAB — EXPECTORATED SPUTUM ASSESSMENT W GRAM STAIN, RFLX TO RESP C

## 2020-04-05 LAB — BASIC METABOLIC PANEL
Anion gap: 12 (ref 5–15)
BUN: 13 mg/dL (ref 8–23)
CO2: 19 mmol/L — ABNORMAL LOW (ref 22–32)
Calcium: 7.9 mg/dL — ABNORMAL LOW (ref 8.9–10.3)
Chloride: 111 mmol/L (ref 98–111)
Creatinine, Ser: 0.67 mg/dL (ref 0.44–1.00)
GFR, Estimated: >60 mL/min (ref >60)
Glucose, Bld: 120 mg/dL — ABNORMAL HIGH (ref 70–99)
Potassium: 3.4 mmol/L — ABNORMAL LOW (ref 3.5–5.1)
Sodium: 142 mmol/L (ref 135–145)

## 2020-04-05 LAB — BPAM RBC
Blood Product Expiration Date: 202203032359
ISSUE DATE / TIME: 202202010900
Unit Type and Rh: 5100

## 2020-04-05 LAB — TYPE AND SCREEN
ABO/RH(D): O POS
Antibody Screen: NEGATIVE
Unit division: 0

## 2020-04-05 MED ORDER — POTASSIUM CHLORIDE 20 MEQ PO PACK
40.0000 meq | PACK | ORAL | Status: AC
  Administered 2020-04-05 (×2): 40 meq via ORAL
  Filled 2020-04-05 (×2): qty 2

## 2020-04-05 NOTE — Progress Notes (Signed)
Patent attorney, Agricultural consultant, noticed that pt's heart rate was bradycardic for a short period of time between 30's to 40's. The pt's heart rate then normalized to her usual 70's. Central tele was later called by this RN and the bradycardic strips were artifact.   Justice Rocher, RN

## 2020-04-05 NOTE — Progress Notes (Signed)
Inpatient Rehab Admissions:  Inpatient Rehab Consult received.  I met with patient at the bedside for rehabilitation assessment and to discuss goals and expectations of an inpatient rehab admission.  She appears oriented, though anxious/fidgeting, while I'm present.  We discussed goals of supervision level and estimated length of stay to be 8-13 days.  She states her husband is home with her and can provide 24/7 assist.  Will need to confirm support at discharge and I will f/u with the husband today.    Signed: Shann Medal, PT, DPT Admissions Coordinator [phone] 04/05/20  1:17 PM

## 2020-04-05 NOTE — Progress Notes (Signed)
Pt is having hallucinations. Pt introduced this RN to her husband, who is not in the room. Pt also pulled off her telemetry leads thinking that she was going somewhere. This RN explained to the pt that she is in her new room and is not going anywhere else. At this time, pt was asked orientation questions and still was able to answer them correctly. During transfer report, previous RN said that pt has been having confusion and hallucinating, despite answering orientation questions appropriately.    Justice Rocher, RN

## 2020-04-05 NOTE — Progress Notes (Signed)
Patient moved to 4NP01 with phone and phone charger. Her husband has been updated of patient's transfer and how her night went. They were very appreciative of our care. Alida Greiner, Rande Brunt, RN

## 2020-04-05 NOTE — Progress Notes (Signed)
Neurosurgery Service Progress Note  Subjective: No acute events overnight   Objective: Vitals:   04/05/20 0700 04/05/20 0800 04/05/20 0900 04/05/20 1000  BP:  (!) 178/86  (!) 153/74  Pulse: 79 73 72 73  Resp: (!) 24 (!) 24 (!) 27 (!) 29  Temp:  97.7 F (36.5 C)  98 F (36.7 C)  TempSrc:  Oral  Oral  SpO2: 95% 97% 96% 97%  Weight:      Height:        Physical Exam: Up and working with therapy  Assessment & Plan: 80 y.o. woman s/p fall w/ R sided weakness, cervical TP frx and interspinous ligament disruption, small diffusion changes in the L precentral gyrus.  -no change in neurosurgical plan   [name]  04/05/20 3:07 PM

## 2020-04-05 NOTE — Progress Notes (Addendum)
Trauma/Critical Care Follow Up Note  Subjective:    Overnight Issues:   Objective:  Vital signs for last 24 hours: Temp:  [97.6 F (36.4 C)-99.4 F (37.4 C)] 98 F (36.7 C) (02/02 0400) Pulse Rate:  [62-91] 79 (02/02 0700) Resp:  [19-29] 24 (02/02 0700) BP: (145-197)/(64-102) 173/81 (02/02 0400) SpO2:  [92 %-100 %] 95 % (02/02 0700)  Hemodynamic parameters for last 24 hours:    Intake/Output from previous day: 02/01 0701 - 02/02 0700 In: 1040.4 [P.O.:580; I.V.:460.4] Out: 900 [Urine:900]  Intake/Output this shift: No intake/output data recorded.  Vent settings for last 24 hours:    Physical Exam:  Gen: comfortable, no distress Neuro: delirious HEENT: PERRL Neck: supple CV: RRR Pulm: unlabored breathing Abd: soft, NT GU: clear yellow urine Extr: wwp, no edema, RUE in sling   Results for orders placed or performed during the hospital encounter of 03/31/20 (from the past 24 hour(s))  Expectorated sputum assessment w rflx to resp cult     Status: None   Collection Time: 04/04/20  6:41 PM   Specimen: Expectorated Sputum  Result Value Ref Range   Specimen Description EXPECTORATED SPUTUM    Special Requests NONE    Sputum evaluation      THIS SPECIMEN IS ACCEPTABLE FOR SPUTUM CULTURE Performed at [facility], [address] [place], [place] [address]    Report Status 04/05/2020 FINAL   Culture, respiratory     Status: None (Preliminary result)   Collection Time: 04/04/20  6:41 PM  Result Value Ref Range   Specimen Description EXPECTORATED SPUTUM    Special Requests NONE Reflexed from [id number]    Gram Stain      ABUNDANT WBC PRESENT, PREDOMINANTLY PMN FEW GRAM POSITIVE RODS FEW YEAST RARE GRAM POSITIVE COCCI Performed at [facility], [place] [address] [place], [place] [address]    Culture PENDING    Report Status PENDING   Hemoglobin and hematocrit, blood     Status: Abnormal   Collection Time: 04/04/20  7:13 PM  Result Value Ref Range    Hemoglobin 10.5 (L) 12.0 - 15.0 g/dL   HCT 30.5 (L) 36.0 - 46.0 %  CBC     Status: Abnormal   Collection Time: 04/05/20  6:19 AM  Result Value Ref Range   WBC 12.5 (H) 4.0 - 10.5 K/uL   RBC 3.37 (L) 3.87 - 5.11 MIL/uL   Hemoglobin 9.6 (L) 12.0 - 15.0 g/dL   HCT 27.0 (L) 36.0 - 46.0 %   MCV 80.1 80.0 - 100.0 fL   MCH 28.5 26.0 - 34.0 pg   MCHC 35.6 30.0 - 36.0 g/dL   RDW 15.3 11.5 - 15.5 %   Platelets 188 150 - 400 K/uL   nRBC 0.2 0.0 - 0.2 %    Assessment & Plan: The plan of care was discussed with the bedside nurse for the day, who is in agreement with this plan and no additional concerns were raised.   Present on Admission: . Cervical spine fracture ([place])    LOS: 5 days   Additional comments:I reviewed the patient's new clinical lab test results.   and I reviewed the patients new imaging test results.    Fall from attic, ~47ft  Right proximal humerus fx- Ortho has evaluated. Non-Op. Per Dr. Doreatha Martin. NWB. Sling  Right posterior thigh contusion Acute hypoxic respiratory failure-extubated 1/30. Having pulm symptoms subjectively yest. CXR with infiltrate, resp cx pending. Await S&S and start a short course  of abx based on culture data. R hemiparesis- CTA of the head and neck showed no arterial injury - motor strip injury on brain per Dr. Zada Finders, improving exam, TBI team therapies C2-4 TVP Fx- Dr. 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 Received Rituxan every 3 months Chronic neck/back pain- followed by Dr. Christella Noa Mild AKI-resolved FEN- FLD, cont SLP, replete hypokalemia VTE-Lovenox Dispo- to 4NP, TBI team therapies, recs for CIR, stable for discharge today from medical standpoint  Jesusita Oka, MD Trauma & General Surgery Please use [email or web address] to contact on call provider  04/05/2020  *Care during the described time interval was  provided by me. I have reviewed this patient's available data, including medical history, events of note, physical examination and test results as part of my evaluation.

## 2020-04-05 NOTE — Progress Notes (Signed)
Physical Therapy Treatment Patient Details Name: Amanda [name] MRN: [id number] DOB: 08/05/1940 Today's Date: 04/05/2020    History of Present Illness 80 y.o. female who presented via EMS as a level 1 trauma after patient reportedly fell ~10 ft from her attic. Pt intubated in the trauma bay due to depressed GCS. Pt found to have proximal R humerus fx, C2-4 TP fx. Brain MRI demonstrates L diffusion changes in the motor strip and superior frontal gyrus. Pt extubated on 04/02/2020.    PT Comments    Pt confused and hallucinating during session, reporting seeing "creepy crawlies" on her bed and talking about goats/goat urination that she saw earlier today. Pt also with very poor safety awareness, attempting to sit on a short trashcan to have a BM while up and walking and requires max cues to not sit on trashcan. Pt overall requiring mod assist for mobility tasks, ambulating x2 room distances. PT to continue to follow acutely.    Follow Up Recommendations  CIR     Equipment Recommendations   (TBD)    Recommendations for Other Services Rehab consult     Precautions / Restrictions Precautions Precautions: Fall Precaution Comments: collar d/c per chart 2/2 [place]/ lovick notes Required Braces or Orthoses: Sling Restrictions Weight Bearing Restrictions: Yes RUE Weight Bearing: Non weight bearing    Mobility  Bed Mobility Overal bed mobility: Needs Assistance Bed Mobility: Supine to Sit     Supine to sit: Mod assist;HOB elevated     General bed mobility comments: up in chair upon PT arrival to room  Transfers Overall transfer level: Needs assistance Equipment used: 1 person hand held assist Transfers: Sit to/from Stand Sit to Stand: Mod assist Stand pivot transfers: Mod assist       General transfer comment: MOd assist for initial power up, rise, and steady. STS x3, from recliner, BSC, and room chair.  Ambulation/Gait Ambulation/Gait assistance: Mod assist Gait Distance  (Feet): 10 Feet (x2) Assistive device: 1 person hand held assist Gait Pattern/deviations: Step-through pattern;Decreased stride length;Trunk flexed Gait velocity: dcr   General Gait Details: mod assist to steady, correct lateral LOB, guide pt to destination surface. Pt favoring RLE but no overt buckling noted   Stairs             Wheelchair Mobility    Modified Rankin (Stroke Patients Only)       Balance Overall balance assessment: Needs assistance Sitting-balance support: Single extremity supported;Feet supported Sitting balance-Leahy Scale: Fair     Standing balance support: Single extremity supported;During functional activity Standing balance-Leahy Scale: Poor Standing balance comment: reliant on external assist                            Cognition Arousal/Alertness: Awake/alert Behavior During Therapy: WFL for tasks assessed/performed Overall Cognitive Status: Impaired/Different from baseline Area of Impairment: Memory;Orientation;Safety/judgement;Awareness                 Orientation Level: Disoriented to;Place;Time (reports that she doesnt have clothing due to coming to hospital for therapy)   Memory: Decreased recall of precautions;Decreased short-term memory   Safety/Judgement: Decreased awareness of safety;Decreased awareness of deficits Awareness: Intellectual Problem Solving: Slow processing;Difficulty sequencing;Requires verbal cues;Requires tactile cues General Comments: pt reporting "where are we on [address]? That is where I saw the goat pee, there is a Doctor, general practice on Tuesdays". Pt also hallucinating, stating she sees "creepy crawlies" on her bed. Pt incontinent of stool during gait,  attempts to sit down on her room trash can to deficate requiring max cues to stop      Exercises Shoulder Exercises Elbow Flexion: AAROM;Right;5 reps;Seated Wrist Flexion: AAROM;5 reps;Right;Seated Digit Composite Flexion: AAROM;Right;5  reps;Seated Donning/doffing shirt without moving shoulder:  (total) Method for sponge bathing under operated UE:  (total) Donning/doffing sling/immobilizer:  (total) Correct positioning of sling/immobilizer:  (total) ROM for elbow, wrist and digits of operated UE: Maximal assistance Positioning of UE while sleeping:  (total (A))    General Comments General comments (skin integrity, edema, etc.): 2LO2 removed prior to mobility, VSS, reapplied at end of session      Pertinent Vitals/Pain Pain Assessment: Faces Faces Pain Scale: Hurts a little bit Pain Location: RUE Pain Descriptors / Indicators: Grimacing Pain Intervention(s): Limited activity within patient's tolerance;Monitored during session;Repositioned    Home Living Family/patient expects to be discharged to:: Private residence Living Arrangements: Spouse/significant other Available Help at Discharge: Family;Available 24 hours/day Type of Home: House Home Access: Stairs to enter Entrance Stairs-Rails: Right;Left;Can reach both Home Layout: One level Home Equipment: Environmental consultant - 2 wheels;Wheelchair - manual;Bedside commode;Grab bars - tub/shower Additional Comments: information obtain from PT eval    Prior Function Level of Independence: Independent          PT Goals (current goals can now be found in the care plan section) Acute Rehab PT Goals Patient Stated Goal: to go to rehab to return to independent mobility PT Goal Formulation: With patient/family Time For Goal Achievement: 04/18/20 Potential to Achieve Goals: Good Progress towards PT goals: Progressing toward goals    Frequency    Min 4X/week      PT Plan Current plan remains appropriate    Co-evaluation              AM-PAC PT "6 Clicks" Mobility   Outcome Measure  Help needed turning from your back to your side while in a flat bed without using bedrails?: A Lot Help needed moving from lying on your back to sitting on the side of a flat bed  without using bedrails?: A Lot Help needed moving to and from a bed to a chair (including a wheelchair)?: A Lot Help needed standing up from a chair using your arms (e.g., wheelchair or bedside chair)?: A Lot Help needed to walk in hospital room?: A Lot Help needed climbing 3-5 steps with a railing? : A Lot 6 Click Score: 12    End of Session   Activity Tolerance: Patient tolerated treatment well;Patient limited by fatigue Patient left: in chair;with call bell/phone within reach;with chair alarm set;with nursing/sitter in room Nurse Communication: Mobility status PT Visit Diagnosis: Unsteadiness on feet (R26.81);Other abnormalities of gait and mobility (R26.89)     Time: 1425-1500 PT Time Calculation (min) (ACUTE ONLY): 35 min  Charges:  $Gait Training: 8-22 mins $Therapeutic Activity: 8-22 mins                    Stacie Glaze, PT Acute Rehabilitation Services Pager [phone]  Office [phone]   Louis Matte 04/05/2020, 5:46 PM

## 2020-04-05 NOTE — Evaluation (Signed)
Occupational Therapy Evaluation Patient Details Name: Amanda [name] MRN: [id number] DOB: 07-05-1940 Today's Date: 04/05/2020    History of Present Illness 80 y.o. female who presented via EMS as a level 1 trauma after patient reportedly fell ~10 ft from her attic. Pt intubated in the trauma bay due to depressed GCS. Pt found to have proximal R humerus fx, C2-4 TP fx. Brain MRI demonstrates L diffusion changes in the motor strip and superior frontal gyrus. Pt extubated on 04/02/2020.   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 Pt currently with functional limitiations due to the deficits listed below (see OT problem list). Pt currently with cognitive and balance deficits. Pt will have (A) of spouse at d/c per patient. Pt with poor recall of R UE restrictions and need for sling positioning. OT to see for R UE positioning, edema management and for adls hand wrist elbow.  Pt will benefit from skilled OT to increase their independence and safety with adls and balance to allow discharge CIR.     Follow Up Recommendations  CIR    Equipment Recommendations  3 in 1 bedside commode    Recommendations for Other Services Rehab consult     Precautions / Restrictions Precautions Precautions: Fall Precaution Comments: collar d/c per chart 2/2 [place]/ lovick notes Required Braces or Orthoses: Sling Restrictions RUE Weight Bearing: Non weight bearing      Mobility Bed Mobility Overal bed mobility: Needs Assistance Bed Mobility: Supine to Sit     Supine to sit: Mod assist;HOB elevated     General bed mobility comments: bil LE off EOB mod (A) and mod (A) to elevate trunk off bed surface    Transfers Overall transfer level: Needs assistance   Transfers: Sit to/from [employer] Transfers Sit to Stand: Mod assist Stand pivot transfers: Mod assist       General transfer comment: R side guarded and cues for safety . lines management required as  pt lacks awareness to lines    Balance Overall balance assessment: Needs assistance Sitting-balance support: Single extremity supported;Feet supported Sitting balance-Leahy Scale: Fair     Standing balance support: Single extremity supported;During functional activity Standing balance-Leahy Scale: Poor Standing balance comment: posterior bias                           ADL either performed or assessed with clinical judgement   ADL Overall ADL's : Needs assistance/impaired Eating/Feeding: Maximal assistance;Sitting Eating/Feeding Details (indicate cue type and reason): decrease grasp on spoon Grooming: Maximal assistance;Sitting   Upper Body Bathing: Total assistance   Lower Body Bathing: Total assistance   Upper Body Dressing : Total assistance   Lower Body Dressing: Total assistance   Toilet Transfer: Moderate assistance;BSC Toilet Transfer Details (indicate cue type and reason): simulated bed to chair         Functional mobility during ADLs: Moderate assistance       Vision Baseline Vision/History: Wears glasses Wears Glasses: Reading only Additional Comments: reports seeing goats     Perception     Praxis      Pertinent Vitals/Pain Pain Assessment: Faces Faces Pain Scale: Hurts a little bit Pain Location: RUE Pain Descriptors / Indicators: Grimacing Pain Intervention(s): Monitored during session;Premedicated before session;Repositioned     Hand Dominance Right   Extremity/Trunk Assessment Upper Extremity Assessment Upper Extremity Assessment: RUE deficits/detail RUE Deficits / Details: RUE sling arom digits elbow and wrist.  pt does not activate without cues RUE Coordination: decreased fine motor;decreased gross motor   Lower Extremity Assessment Lower Extremity Assessment: Defer to PT evaluation   Cervical / Trunk Assessment Cervical / Trunk Assessment: Other exceptions (C2-4 fx with d/c Ccollar)   Communication  Communication Communication: No difficulties   Cognition Arousal/Alertness: Awake/alert Behavior During Therapy: WFL for tasks assessed/performed Overall Cognitive Status: Impaired/Different from baseline Area of Impairment: Memory;Orientation;Safety/judgement;Awareness                 Orientation Level: Disoriented to;Place;Time (reports that she doesnt have clothing due to coming to hospital for therapy)   Memory: Decreased recall of precautions;Decreased short-term memory   Safety/Judgement: Decreased awareness of safety;Decreased awareness of deficits Awareness: Intellectual Problem Solving: Slow processing General Comments: able to report that she feel putting away [employer]. pt reports there are goats in the room.   General Comments  3L [place] R sling edema noted throughout extremity    Exercises Exercises: Shoulder Shoulder Exercises Elbow Flexion: AAROM;Right;5 reps;Seated Wrist Flexion: AAROM;5 reps;Right;Seated Digit Composite Flexion: AAROM;Right;5 reps;Seated   Shoulder Instructions Shoulder Instructions Donning/doffing shirt without moving shoulder:  (total) Method for sponge bathing under operated UE:  (total) Donning/doffing sling/immobilizer:  (total) Correct positioning of sling/immobilizer:  (total) ROM for elbow, wrist and digits of operated UE: Maximal assistance Positioning of UE while sleeping:  (total (A))    Home Living Family/patient expects to be discharged to:: Private residence Living Arrangements: Spouse/significant other Available Help at Discharge: Family;Available 24 hours/day Type of Home: House Home Access: Stairs to enter [employer] of Steps: 4 Entrance Stairs-Rails: Right;Left;Can reach both Home Layout: One level     Bathroom Shower/Tub: Teacher, early years/pre: Standard Bathroom Accessibility: Yes   Home Equipment: Environmental consultant - 2 wheels;Wheelchair - manual;Bedside commode;Grab bars - tub/shower    Additional Comments: information obtain from PT eval  Lives With: Spouse    Prior Functioning/Environment Level of Independence: Independent                 OT Problem List: Decreased strength;Decreased range of motion;Decreased activity tolerance;Impaired balance (sitting and/or standing);Decreased cognition;Decreased safety awareness;Decreased knowledge of precautions;Decreased knowledge of use of DME or AE;Cardiopulmonary status limiting activity;Impaired sensation;Impaired UE functional use;Pain;Increased edema      OT Treatment/Interventions: Self-care/ADL training;Therapeutic exercise;Neuromuscular education;Energy conservation;DME and/or AE instruction;Manual therapy;Modalities;Splinting;Therapeutic activities;Cognitive remediation/compensation;Visual/perceptual remediation/compensation;Patient/family education;Balance training    OT Goals(Current goals can be found in the care plan section) Acute Rehab OT Goals Patient Stated Goal: i am here from home to [facility]. I have 3 goats in here OT Goal Formulation: Patient unable to participate in goal setting Time For Goal Achievement: 04/19/20 Potential to Achieve Goals: Fair  OT Frequency: Min 2X/week   Barriers to D/C:            Co-evaluation              AM-PAC OT "6 Clicks" Daily Activity     Outcome Measure Help from another person eating meals?: A Lot Help from another person taking care of personal grooming?: A Lot Help from another person toileting, which includes using toliet, bedpan, or urinal?: A Lot Help from another person bathing (including washing, rinsing, drying)?: A Lot Help from another person to put on and taking off regular upper body clothing?: A Lot Help from another person to put on and taking off regular lower body clothing?: Total 6 Click Score: 11   End of Session Equipment Utilized During Treatment:  Oxygen Nurse Communication: Mobility status;Precautions;Weight bearing  status  Activity Tolerance: Patient tolerated treatment well Patient left: in chair;with call bell/phone within reach;Other (comment) (PT Alexa arriving to start PT session)  OT Visit Diagnosis: Unsteadiness on feet (R26.81);Muscle weakness (generalized) (M62.81);Pain Pain - Right/Left: Right Pain - part of body: Shoulder                Time: [id number] OT Time Calculation (min): 18 min Charges:  OT General Charges $OT Visit: 1 Visit OT Evaluation $OT Eval Moderate Complexity: 1 Mod   Brynn, OTR/L  Acute Rehabilitation Services Pager: [phone] Office: [phone] .   Jeri Modena 04/05/2020, 3:10 PM

## 2020-04-05 NOTE — Consult Note (Signed)
Physical Medicine and Rehabilitation Consult  Reason for Consult: Functional deficits due to fall with TBI/polytrauma Referring Physician:  Dr.    Harrison Mons: Amanda [name] is a 80 y.o. female with history of lymphoma, macular degeneration, bronchiectasis who fell 10' from her attic on 03/31/20 and had obtundation with right sided weakness.  She was found to have left frontal parietal lobe diffusion abnormality, right C2-C4 transverse process Fx, left 1st/2nd rib fx,  right humerus fracture,  The patient had a drop in hemoglobin to 6.9 and required transfusion of PRBCs, posttransfusion hemoglobin up to 10.5 and down to 9.6 this morning The patient has nausea which is most associated with movement.  No abdominal pain Also has right hip pain Patient lives with her husband who walks with a cane outside the home but without a cane inside the home  Review of Systems  Constitutional: Positive for malaise/fatigue.  HENT: Negative for nosebleeds.   Eyes: Negative for double vision, pain, discharge and redness.  Respiratory: Negative for cough, shortness of breath, wheezing and stridor.   Cardiovascular: Negative for chest pain and leg swelling.  Gastrointestinal: Positive for nausea. Negative for vomiting.  Genitourinary: Negative for flank pain.  Musculoskeletal: Positive for falls and neck pain.  Skin: Negative.   Neurological: Positive for focal weakness. Negative for speech change and headaches.  Endo/Heme/Allergies: Does not bruise/bleed easily.  Psychiatric/Behavioral: Positive for memory loss. The patient is not nervous/anxious.       Past Medical History:  Diagnosis Date  . Bronchiectasis ([place])   . Cervicalgia   . Hypothyroid   . Macular degeneration, bilateral   . 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 Pneumonia due to COVID-19 virus 2021   Required hospitalization  . Urinary, incontinence, stress female    Past Surgical History:  Procedure Laterality Date  . ORIF ANKLE FRACTURE  Left 12/2018   No family history on file.   Social History:  reports that she has never smoked. She has never used smokeless tobacco. No history on file for alcohol use and drug use.   Allergies  Allergen Reactions  . Levaquin [Levofloxacin] Nausea And Vomiting  . Pravastatin Nausea And Vomiting  . Preservision Areds 2 [Multiple Vitamins-Minerals] Nausea Only  . Promethazine Other (See Comments)    "Agitation and jerking"    Medications Prior to Admission  Medication Sig Dispense Refill  . acetaminophen (TYLENOL) 650 MG CR tablet Take 650 mg by mouth 2 (two) times daily as needed for pain.    [name] albuterol (PROVENTIL) (2.5 MG/3ML) 0.083% nebulizer solution Take 2.5 mg by nebulization every 6 (six) hours as needed for wheezing or shortness of breath.    [name] amLODipine (NORVASC) 10 MG tablet Take 10 mg by mouth daily.    [name] aspirin EC 81 MG tablet Take 81 mg by mouth daily. Swallow whole.    [name] buPROPion (WELLBUTRIN XL) 150 MG 24 hr tablet Take 150 mg by mouth daily.    . famotidine (PEPCID) 20 MG tablet Take 20 mg by mouth daily as needed for heartburn or indigestion.    [name] levothyroxine (SYNTHROID) 125 MCG tablet Take 125 mcg by mouth daily before breakfast.    . Multiple Vitamins-Minerals (MACULAR HEALTH FORMULA) CAPS Take 1 capsule by mouth daily.    . Multiple Vitamins-Minerals (MACULAR VITAMIN BENEFIT) TABS Take 1 tablet by mouth daily with breakfast.    . NON FORMULARY Take 1 tablet by mouth See admin instructions. Calcium/D-3/Magnesium- Take 0.5 tablet by mouth  two times a day    . ondansetron (ZOFRAN) 4 MG tablet Take 4 mg by mouth every 8 (eight) hours as needed for nausea or vomiting.    [name] PROVENTIL HFA 108 (90 Base) MCG/ACT inhaler Inhale 1-2 puffs into the lungs every 6 (six) hours as needed for wheezing or shortness of breath.      Home: Home Living Family/patient expects to be discharged to:: Private residence Living Arrangements: Spouse/significant other Available Help at  Discharge: Family,Available 24 hours/day (minA available) Type of Home: House Home Access: Stairs to enter [employer] of Steps: 4 Entrance Stairs-Rails: Right,Left,Can reach both [place]: One level Bathroom Shower/Tub: Chiropodist: [place]: Environmental consultant - 2 wheels,Wheelchair - manual,Bedside commode,Grab bars - tub/shower  Lives With: Spouse  Functional History: Prior Function Level of Independence: Independent Functional Status:  Mobility: Bed Mobility Overal bed mobility: Needs Assistance Bed Mobility: Supine to Sit Supine to sit: Mod assist,HOB elevated General bed mobility comments: PT provides assist to pivot hips to edge of bed Transfers Overall transfer level: Needs assistance Equipment used: 1 person hand held assist Transfers: Sit to/from [place] to Stand: Mod assist Stand pivot transfers: Mod assist      ADL:    Cognition: Cognition Overall Cognitive Status: Impaired/Different from baseline Arousal/Alertness: Awake/alert Orientation Level: Oriented to person,Oriented to place,Oriented to time,Oriented to situation Attention: Focused,Sustained,Selective Focused Attention: Appears intact Sustained Attention: Appears intact Selective Attention: Appears intact Memory: Impaired Memory Impairment: Retrieval deficit Awareness: Appears intact Awareness Impairment: Intellectual impairment,Emergent impairment Problem Solving: Appears intact Cognition Arousal/Alertness: Awake/alert Behavior During Therapy: WFL for tasks assessed/performed Overall Cognitive Status: Impaired/Different from baseline Area of Impairment: Problem solving Problem Solving: Slow processing,Requires verbal cues,Requires tactile cues   Blood pressure (!) 153/74, pulse 73, temperature 98 F (36.7 C), temperature source Oral, resp. rate (!) 29, height 5' (1.524 m), weight 70 kg, SpO2 97 %. Physical Exam Vitals and nursing  note reviewed.  Constitutional:      Appearance: Normal appearance.  HENT:     Head: Normocephalic.  Eyes:     Extraocular Movements: Extraocular movements intact.     Pupils: Pupils are equal, round, and reactive to light.  Cardiovascular:     Rate and Rhythm: Normal rate and regular rhythm.     Heart sounds: Normal heart sounds.  Pulmonary:     Effort: No respiratory distress.     Breath sounds: No stridor. No wheezing or rales.  Abdominal:     General: Abdomen is flat. Bowel sounds are normal. There is no distension.     Palpations: Abdomen is soft.     Tenderness: There is no abdominal tenderness.  Musculoskeletal:     Cervical back: No rigidity.     Right lower leg: No edema.     Left lower leg: No edema.     Comments: Edema in the right hand and forearm 1+ to 2+ pitting Right upper extremity not ranged at the shoulder or elbow due to humeral fracture.  The patient has no pain with right hand range of motion No pain with right lower extremity range of motion with hip internal extra rotation as well as flexion extension.  No pain with knee or ankle range of motion. No pain with range of motion in the left upper or left lower limb  Skin:    General: Skin is warm and dry.  Neurological:     Mental Status: She is alert. She is disoriented.  Comments: Motor strength could not be tested fully in the right upper extremity due to right humeral fracture she has 4 -/5 in the finger flexors and extensors as well as wrist flexors and extensors Right lower extremity trace hip flexion 3 - knee extension to minus ankle dorsiflexion plantarflexion Patient indicates that light touch is equal in bilateral upper and lower limbs  Psychiatric:        Mood and Affect: Mood normal.        Behavior: Behavior normal.   The patient is oriented to person and place, month but not day or date  Results for orders placed or performed during the hospital encounter of 03/31/20 (from the past 24  hour(s))  Expectorated sputum assessment w rflx to resp cult     Status: None   Collection Time: 04/04/20  6:41 PM   Specimen: Expectorated Sputum  Result Value Ref Range   Specimen Description EXPECTORATED SPUTUM    Special Requests NONE    Sputum evaluation      THIS SPECIMEN IS ACCEPTABLE FOR SPUTUM CULTURE Performed at [facility], [address] [place], [place] [address]    Report Status 04/05/2020 FINAL   Culture, respiratory     Status: None (Preliminary result)   Collection Time: 04/04/20  6:41 PM  Result Value Ref Range   Specimen Description EXPECTORATED SPUTUM    Special Requests NONE Reflexed from [id number]    Gram Stain      ABUNDANT WBC PRESENT, PREDOMINANTLY PMN FEW GRAM POSITIVE RODS FEW YEAST RARE GRAM POSITIVE COCCI Performed at [facility], [place] [address] [place], [place] [address]    Culture PENDING    Report Status PENDING   Hemoglobin and hematocrit, blood     Status: Abnormal   Collection Time: 04/04/20  7:13 PM  Result Value Ref Range   Hemoglobin 10.5 (L) 12.0 - 15.0 g/dL   HCT 30.5 (L) 36.0 - [id number] %  Basic metabolic panel     Status: Abnormal   Collection Time: 04/05/20  6:19 AM  Result Value Ref Range   Sodium 142 135 - 145 mmol/L   Potassium 3.4 (L) 3.5 - 5.1 mmol/L   Chloride 111 98 - 111 mmol/L   CO2 19 (L) 22 - 32 mmol/L   Glucose, Bld 120 (H) 70 - 99 mg/dL   BUN 13 8 - 23 mg/dL   Creatinine, Ser 0.67 0.44 - 1.00 mg/dL   Calcium 7.9 (L) 8.9 - 10.3 mg/dL   GFR, Estimated >60 >60 mL/min   Anion gap 12 5 - 15  CBC     Status: Abnormal   Collection Time: 04/05/20  6:19 AM  Result Value Ref Range   WBC 12.5 (H) 4.0 - 10.5 K/uL   RBC 3.37 (L) 3.87 - 5.11 MIL/uL   Hemoglobin 9.6 (L) 12.0 - 15.0 g/dL   HCT 27.0 (L) 36.0 - 46.0 %   MCV 80.1 80.0 - 100.0 fL   MCH 28.5 26.0 - 34.0 pg   MCHC 35.6 30.0 - 36.0 g/dL   RDW 15.3 11.5 - 15.5 %   Platelets 188 150 - 400 K/uL   nRBC 0.2 0.0 - 0.2 %   DG Chest Port 1 View  Result  Date: 04/04/2020 CLINICAL DATA:  Respiratory failure.  Recent fall. EXAM: PORTABLE CHEST 1 VIEW COMPARISON:  Chest radiograph 03/31/2020 and CT 03/31/2020 FINDINGS: A right jugular Port-A-Cath terminates over the mid SVC. Endotracheal tube has been removed. The cardiomediastinal silhouette  is unchanged. There are greatly increased airspace opacities in the left mid and lower lung including dense retrocardiac consolidation, and there is also mild right basilar airspace opacity. There is a small left pleural effusion. No pneumothorax is identified. Left rib and right humerus fractures are again noted. IMPRESSION: 1. Interval extubation. 2. Worsening airspace opacities in the left mid lung and left greater than right lung bases which could reflect a combination of contusion, pneumonia, and atelectasis with a small left pleural effusion. Electronically Signed   By: Logan Bores M.D.   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 Does the need for close, 24 hr/day medical supervision in concert with the patient's rehab needs make it unreasonable for this patient to be served in a less intensive setting? Yes 2. Co-Morbidities requiring supervision/potential complications: Right humeral fracture, left rib fractures #1 and 2, C2-C3-C4 transverse process fracture, acute blood loss anemia 3. Due to bladder management, bowel management, safety, skin/wound care, disease management, medication administration, pain management and patient education, does the patient require 24 hr/day rehab nursing? Yes 4. Does the patient require coordinated care of a physician, rehab nurse, therapy disciplines of PT, OT, speech to address physical and functional deficits in the context of the above medical diagnosis(es)? Yes Addressing deficits in the following areas: balance, endurance, locomotion, strength, transferring, bowel/bladder control, bathing,  dressing, feeding, grooming, toileting, cognition and psychosocial support 5. Can the patient actively participate in an intensive therapy program of at least 3 hrs of therapy per day at least 5 days per week? Should be able to tolerate this in the next day or 2 6. The potential for patient to make measurable gains while on inpatient rehab is good 7. Anticipated functional outcomes upon discharge from inpatient rehab are supervision  with PT, supervision with OT, supervision with SLP. 8. Estimated rehab length of stay to reach the above functional goals is: 14 to 16 days 9. Anticipated discharge destination: Home 10. Overall Rehab/Functional Prognosis: good  RECOMMENDATIONS: This patient's condition is appropriate for continued rehabilitative care in the following setting: CIR Patient has agreed to participate in recommended program. Yes Note that insurance prior authorization may be required for reimbursement for recommended care.  Comment: We will need to make sure that hemoglobin is stable prior to rehab.  Also will need to control nausea which has impeded movement.   Bary Leriche, PA-C 04/05/2020

## 2020-04-05 NOTE — Progress Notes (Signed)
Pt transferred to 4NP01 from 4NICU27. Report given to this RN from Eber Hong, RN. Pt's husband has been updated on the transfer.  Justice Rocher, RN

## 2020-04-06 LAB — CBC
HCT: 25.1 % — ABNORMAL LOW (ref 36.0–46.0)
Hemoglobin: 8.8 g/dL — ABNORMAL LOW (ref 12.0–15.0)
MCH: 28.2 pg (ref 26.0–34.0)
MCHC: 35.1 g/dL (ref 30.0–36.0)
MCV: 80.4 fL (ref 80.0–100.0)
Platelets: 227 10*3/uL (ref 150–400)
RBC: 3.12 MIL/uL — ABNORMAL LOW (ref 3.87–5.11)
RDW: 15.6 % — ABNORMAL HIGH (ref 11.5–15.5)
WBC: 9.7 10*3/uL (ref 4.0–10.5)
nRBC: 0.5 % — ABNORMAL HIGH (ref 0.0–0.2)

## 2020-04-06 LAB — BASIC METABOLIC PANEL
Anion gap: 10 (ref 5–15)
BUN: 17 mg/dL (ref 8–23)
CO2: 21 mmol/L — ABNORMAL LOW (ref 22–32)
Calcium: 7.8 mg/dL — ABNORMAL LOW (ref 8.9–10.3)
Chloride: 111 mmol/L (ref 98–111)
Creatinine, Ser: 0.74 mg/dL (ref 0.44–1.00)
GFR, Estimated: >60 mL/min (ref >60)
Glucose, Bld: 89 mg/dL (ref 70–99)
Potassium: 3.6 mmol/L (ref 3.5–5.1)
Sodium: 142 mmol/L (ref 135–145)

## 2020-04-06 MED ORDER — POLYETHYLENE GLYCOL 3350 17 G PO PACK
17.0000 g | PACK | Freq: Every day | ORAL | Status: DC
  Administered 2020-04-06 – 2020-04-07 (×2): 17 g via ORAL
  Filled 2020-04-06 (×2): qty 1

## 2020-04-06 NOTE — Progress Notes (Signed)
Physical Therapy Treatment Patient Details Name: Amanda [name] MRN: [id number] DOB: 09/11/1940 Today's Date: 04/06/2020    History of Present Illness 80 y.o. female who presented via EMS as a level 1 trauma after patient reportedly fell ~10 ft from her attic. Pt intubated in the trauma bay due to depressed GCS. Pt found to have proximal R humerus fx, C2-4 TP fx. Brain MRI demonstrates L diffusion changes in the motor strip and superior frontal gyrus. Pt extubated on 04/02/2020.    PT Comments    Pt able to make good progress with PT session focused on progression of ambulation and standing stability. The pt was able to progress to walking longer distances in her room, but continues to require modA of 1 with HHA to maintain upright due to multiple minor LOB laterally. The pt was also able to practice progressive bouts of static standing at the bathroom sink with focus on improved tolerance as well as decreased lateral sway. The pt recovers well with seated rest, and was then able to complete multiple standing bouts as well as progressive bouts of ambulation in the room. Continue to recommend CIR level therapies to facilitate safe return to prior level of mobility and independence.     Follow Up Recommendations  CIR     Equipment Recommendations   (defer to post acute)    Recommendations for Other Services       Precautions / Restrictions Precautions Precautions: Fall Precaution Comments: collar d/c per chart 2/2 [place]/ lovick notes Required Braces or Orthoses: Sling Restrictions Weight Bearing Restrictions: Yes RUE Weight Bearing: Non weight bearing    Mobility  Bed Mobility Overal bed mobility: Needs Assistance Bed Mobility: Supine to Sit     Supine to sit: Min assist;HOB elevated     General bed mobility comments: minA to raise trunk from HPB, pulling with LUE. able to move BLE to EOB without assist, cues to not use RUE  Transfers Overall transfer level: Needs  assistance Equipment used: 1 person hand held assist Transfers: Sit to/from Stand Sit to Stand: Mod assist         General transfer comment: modA to power up with support on R hip to steady. completed x5 through session from various surfaces  Ambulation/Gait Ambulation/Gait assistance: Mod assist Gait Distance (Feet): 15 Feet (+ 32ft) Assistive device: 1 person hand held assist Gait Pattern/deviations: Step-through pattern;Decreased stride length;Trunk flexed Gait velocity: dcr   General Gait Details:  (modA through LUE HHA to steady, slow and guarded gait with multiple lateral LOB.)       Balance Overall balance assessment: Needs assistance Sitting-balance support: Single extremity supported;Feet supported Sitting balance-Leahy Scale: Fair Sitting balance - Comments: reliant on UE support of bed   Standing balance support: Single extremity supported;During functional activity Standing balance-Leahy Scale: Poor Standing balance comment: reliant on external assist                            Cognition Arousal/Alertness: Awake/alert Behavior During Therapy: WFL for tasks assessed/performed Overall Cognitive Status: Impaired/Different from baseline                                        Exercises      General Comments General comments (skin integrity, edema, etc.): 1L O2 removed for mobility in room, VSS      Pertinent Vitals/Pain Pain  Assessment: Faces Pain Score: 7  Faces Pain Scale: Hurts a little bit Pain Location: RUE Pain Descriptors / Indicators: Grimacing Pain Intervention(s): Limited activity within patient's tolerance;Monitored during session;Repositioned           PT Goals (current goals can now be found in the care plan section) Acute Rehab PT Goals Patient Stated Goal: to go to rehab to return to independent mobility PT Goal Formulation: With patient/family Time For Goal Achievement: 04/18/20 Potential to Achieve  Goals: Good Progress towards PT goals: Progressing toward goals    Frequency    Min 4X/week      PT Plan Current plan remains appropriate       AM-PAC PT "6 Clicks" Mobility   Outcome Measure  Help needed turning from your back to your side while in a flat bed without using bedrails?: A Lot Help needed moving from lying on your back to sitting on the side of a flat bed without using bedrails?: A Lot Help needed moving to and from a bed to a chair (including a wheelchair)?: A Lot Help needed standing up from a chair using your arms (e.g., wheelchair or bedside chair)?: A Lot Help needed to walk in hospital room?: A Lot Help needed climbing 3-5 steps with a railing? : A Lot 6 Click Score: 12    End of Session Equipment Utilized During Treatment: Oxygen Activity Tolerance: Patient tolerated treatment well;Patient limited by fatigue Patient left: in chair;with call bell/phone within reach;with chair alarm set;with nursing/sitter in room Nurse Communication: Mobility status PT Visit Diagnosis: Unsteadiness on feet (R26.81);Other abnormalities of gait and mobility (R26.89)     Time: 1638-4665 PT Time Calculation (min) (ACUTE ONLY): 45 min  Charges:  $Gait Training: 8-22 mins $Therapeutic Activity: 23-37 mins                     Amanda [name], PT, DPT   Acute Rehabilitation Department Pager #: [phone]   Otho Bellows 04/06/2020, 2:48 PM

## 2020-04-06 NOTE — Progress Notes (Signed)
  Speech Language Pathology Treatment: Cognitive-Linquistic;Dysphagia  Patient Details Name: Amanda [name] MRN: [id number] DOB: [birth year] Today's Date: 04/06/2020 Time: 2563-8937 SLP Time Calculation (min) (ACUTE ONLY): 26 min  Assessment / Plan / Recommendation Clinical Impression  Pt seen for skilled observation with differential dx and progression of intake with regular consistency with adequate mastication noted with solids, brisk swallow and no s/s of aspiration noted during short trial.  Pt consumed thin via straw with small sips given min verbal cues from SLP without s/s of aspiration.  Pt observed with medication administration when SLP was in room with noted difficulty "since she was 9" with pill intake.  She takes them 1 at a time with liquids at home and is able to consume safely.  Delayed cough noted after intake of pills/liquids with modifications suggested for puree/whole pills to increase safety prn.  Recommend progression to regular/thin liquids with ST f/u for diet tolerance/safety during acute stay.   Pt administered SLUMS ([place] Mental Status Examination) during session with a score obtained of 21/24 with graphic expression portion eliminated d/t injury with right arm/dominant hand with swelling and limited movement for completion of this subtest.  Her obtained score was 21/24 with graphic expression portion removed.  Normal score on this assessment is 27/30 with all sub-tests given.  She exhibited min difficulty with simple calculation task and organizational task with perseveration noted.  Nursing also informed SLP she "asks the same questions over and over."  She recalled objects after a time delay and was able to recall SLP's name at end of session.  She could not recall specifics of situation except "I fell and broke my arm and injured my hip."  When leading questions asked, she did recall more information, but vague information given.  Pt oriented to self, place and  month/year.  ST will continue to f/u for cognitive deficits and on-going assessment.   HPI HPI: Amanda [name] is an 80 y.o. female with a Cervical spine fracture (C2-4 TVP) after a 10 ft fall from the attic. Also sustained motor strip injury on brain with right hemiparesis. right proximal humerus fx, Intubated from 1/28 to 1/30.      SLP Plan  Goals updated       Recommendations  Diet recommendations: Regular;Thin liquid Liquids provided via: Cup;Straw Medication Administration: Whole meds with liquid Supervision: Patient able to self feed;Intermittent supervision to cue for compensatory strategies Compensations: Slow rate;Small sips/bites                General recommendations: Other(comment) (TBD) Oral Care Recommendations: Oral care BID Follow up Recommendations: Other (comment) (TBD) SLP Visit Diagnosis: Dysphagia, unspecified (R13.10);Cognitive communication deficit [phone]) Plan: Goals updated                      Elvina Sidle, M.S., [place] 04/06/2020, 2:19 PM

## 2020-04-06 NOTE — Discharge Summary (Addendum)
[facility] Discharge Summary   Patient ID: Amanda [name] MRN: [id number] DOB/AGE: [birth year] 80 y.o.  Admit date: 03/31/2020 Discharge date: 04/08/2020  Admitting Diagnosis: Fall from attic, ~84ft Right proximal humerus fracture Right posterior thigh contusion VDRF  Right hemiparesis C2-4 TVP Fracture HTN HLD Marginal zone Lymphoma Chronic neck/back pain Mild AKI   Discharge Diagnosis Fall from attic, ~68ft Right proximal humerus fracture Right posterior thigh contusion Acute hypoxic respiratory failure Right hemiparesis C2-4 TVP Fracture HTN HLD Marginal zone Lymphoma Chronic neck/back pain Mild AKI  Consultants Orthopedics Neurosurgery  Imaging: DG CHEST PORT 1 VIEW  Result Date: 04/07/2020 CLINICAL DATA:  Healthcare associated pneumonia. EXAM: PORTABLE CHEST 1 VIEW COMPARISON:  04/04/2020 FINDINGS: 0922 hours. Retrocardiac left base collapse/consolidative opacity is similar to prior with small left pleural effusion. Similar but more modest airspace disease noted right base. Interstitial markings are diffusely coarsened with chronic features. Cardiopericardial silhouette is at upper limits of normal for size. Right Port-A-Cath again noted. Telemetry leads overlie the chest. IMPRESSION: No significant change in bibasilar airspace disease, left greater than right, with small left pleural effusion. Electronically Signed   By: Misty Stanley M.D.   On: 04/07/2020 09:37    Procedures None  Hospital Course:  Amanda [name] is an 80yo female who presented to [facility] 1/28 as a level 1 trauma after falling about 10 feet from her attic. This was heard by her husband. On arrival patients GCS 7 and she was not moving her right side. She was noted to have a deformity of the right side of her chest wall at her shoulder with a large bruise. Patient was intubated in the trauma bay due to depressed GCS. Pelvic and CXR unremarkable besides proximal right humerus fx. Patient  was taken to the CT scanner. Work up showed Right proximal humerus fracture, Right posterior thigh contusion, Acute hypoxic respiratory failure, Right hemiparesis, and C2-4 TVP Fracture. Patient was admitted to the trauma service. Injuries managed as below:  Right proximal humerus fracture Orthopedics was consulted and recommended nonoperative management. Nonweightbearing RUE in sling.  Right posterior thigh contusion Hemoglobin monitored and remained stable without the need for blood transfusion. Main and edema managed with ice.  Acute hypoxic respiratory failure Patient was intubated in the trauma bay on 1/28 and successfully extubated 1/30. She developed pulmonary symptoms 2/1 and chest xray revealed possible infiltrate. Respiratory culture obtained and grew pseudomonas, she was treated with 5 days cefepime.   Right hemiparesis Neurosurgery was consulted for management. CTA of the head and neck showed no arterial injury. 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 Patient worked with TBI team therapies and patient made slow progress from neurological standpoint.  C2-4 transverse process fracture Neurosurgery was consulted and recommended nonoperative management. Cleared for c-collar removal.   Patient worked with therapies during this admission who recommended inpatient rehab when medically stable for discharge. On 2/5, the patient was tolerating diet, working well with therapies, pain well controlled, vital signs stable and felt stable for discharge to rehab.  Patient will follow up as below and knows to call with questions or concerns.    I have personally reviewed the patients medication history on the [place] controlled substance database.   I was not directly involved in this patient's care therefore the information in this discharge summary was taken from the chart.  Physical Exam:    Allergies as of 04/08/2020      Reactions   Levaquin [levofloxacin] Nausea And Vomiting  Pravastatin Nausea And Vomiting   Preservision Areds 2 [multiple Vitamins-minerals] Nausea Only   Promethazine Other (See Comments)   "Agitation and jerking"      Medication List    TAKE these medications   acetaminophen 650 MG CR tablet Commonly known as: TYLENOL Take 650 mg by mouth 2 (two) times daily as needed for pain.   amLODipine 10 MG tablet Commonly known as: NORVASC Take 10 mg by mouth daily.   aspirin EC 81 MG tablet Take 81 mg by mouth daily. Swallow whole.   buPROPion 150 MG 24 hr tablet Commonly known as: WELLBUTRIN XL Take 150 mg by mouth daily.   famotidine 20 MG tablet Commonly known as: PEPCID Take 20 mg by mouth daily as needed for heartburn or indigestion.   levofloxacin 500 MG tablet Commonly known as: Levaquin Take 1 tablet (500 mg total) by mouth daily for 4 days.   levothyroxine 125 MCG tablet Commonly known as: SYNTHROID Take 125 mcg by mouth daily before breakfast.   Macular Health Formula Caps Take 1 capsule by mouth daily.   Macular Vitamin Benefit Tabs Take 1 tablet by mouth daily with breakfast.   NON FORMULARY Take 1 tablet by mouth See admin instructions. Calcium/D-3/Magnesium- Take 0.5 tablet by mouth two times a day   ondansetron 4 MG tablet Commonly known as: ZOFRAN Take 4 mg by mouth every 8 (eight) hours as needed for nausea or vomiting.   Proventil HFA 108 (90 Base) MCG/ACT inhaler Generic drug: albuterol Inhale 1-2 puffs into the lungs every 6 (six) hours as needed for wheezing or shortness of breath.   albuterol (2.5 MG/3ML) 0.083% nebulizer solution Commonly known as: PROVENTIL Take 2.5 mg by nebulization every 6 (six) hours as needed for wheezing or shortness of breath.     levaquin 500mg  po qday x 4 for pseudomonas PNA    Follow-up Information    Haddix, Thomasene Lot, MD. Call.   Specialty: Orthopedic Surgery Why: call to arrange follow up regarding Right humerus fracture Contact information: [place] [address] [phone]        [name], MD. Call.   Specialty: Neurosurgery Why: Call to arrange follow up regarding head and neck injuries Contact information: [place] [address] [phone]        [place]. Call.   Why: as needed, you do not have to schedule an appointment Contact information: [address] [address] [place], [place], [place]. Call.   Specialty: Family Medicine Why: Call to arrange post-hospitalization follow up with your primary care physician Contact information: [address] [place] [address] [phone]              Discharge summary prepared by PA  I simply took it over since I dc pt today  Signed: Leighton Ruff. Redmond Pulling, MD, FACS General, Bariatric, & Minimally Invasive Surgery [facility]  Please see Amion for pager number during day hours 7:00am-4:30pm

## 2020-04-06 NOTE — Progress Notes (Signed)
Inpatient Rehab Admissions Coordinator:   I spoke to pt's husband over this phone this morning and he confirms they are hopeful for CIR and he can provide adequate supervision at discharge.  I do not have a bed available for pt to admit today but will follow for admission once bed becomes available.  Shann Medal, PT, DPT Admissions Coordinator [phone] 04/06/20  9:49 AM

## 2020-04-06 NOTE — Progress Notes (Signed)
Neurosurgery Service Progress Note  Subjective: No acute events overnight   Objective: Vitals:   04/06/20 0311 04/06/20 0807 04/06/20 0809 04/06/20 1118  BP: (!) 156/67 (!) 177/82 (!) 177/82 (!) 169/84  Pulse: 65 66 70 66  Resp: 20 20 (!) 21 (!) 24  Temp: 98.1 F (36.7 C)  98.2 F (36.8 C) 98.4 F (36.9 C)  TempSrc: Axillary  Oral Oral  SpO2: 96% 95% 95% 96%  Weight:      Height:        Physical Exam: Awake/alert, FCx4, 4-/5 on R but limited by shoulder sling and humerus frx, 5/5 on R  Assessment & Plan: 80 y.o. woman s/p fall w/ R sided weakness, cervical TP frx and interspinous ligament disruption, small diffusion changes in the L precentral gyrus.  -no change in neurosurgical plan   [name]  04/06/20 11:45 AM

## 2020-04-06 NOTE — Care Management Important Message (Signed)
Important Message  Patient Details  Name: Amanda [name] MRN: [id number] Date of Birth: April 18, 1940   Medicare Important Message Given:  Yes     Orbie Pyo 04/06/2020, 2:33 PM

## 2020-04-06 NOTE — Progress Notes (Signed)
Progress Note     Subjective: Patient resting this AM. When I asked how she was feeling, she reported "We'll see". Reports pain with mobilization in RUE and chest. Denies abdominal pain and nausea. Alert and oriented x4.    Objective: Vital signs in last 24 hours: Temp:  [97.7 F (36.5 C)-98.3 F (36.8 C)] 98.1 F (36.7 C) (02/03 0311) Pulse Rate:  [65-74] 65 (02/03 0311) Resp:  [18-29] 20 (02/03 0311) BP: (149-158)/(67-81) 156/67 (02/03 0311) SpO2:  [94 %-97 %] 96 % (02/03 0311) Last BM Date: 04/05/20  Intake/Output from previous day: 02/02 0701 - 02/03 0700 In: 200 [P.O.:200] Out: -  Intake/Output this shift: No intake/output data recorded.  PE: General: pleasant, WD, WN female who is laying in bed in NAD HEENT: head is normocephalic, atraumatic.  Sclera are noninjected.  PERRL.  Ears and nose without any masses or lesions.  Mouth is pink and moist Heart: regular, rate, and rhythm. Palpable radial and pedal pulses bilaterally Lungs: expiratory wheezing on the left anterior fields.  Respiratory effort nonlabored Abd: soft, NT, ND, +BS, no masses, hernias, or organomegaly MS: RUE with ecchymosis and mild edema, sling not present in bed Skin: warm and dry with no masses, lesions, or rashes Neuro: Cranial nerves 2-12 grossly intact, sensation is normal throughout Psych: A&Ox3 with an appropriate affect.    Lab Results:  Recent Labs    04/05/20 0619 04/06/20 0319  WBC 12.5* 9.7  HGB 9.6* 8.8*  HCT 27.0* 25.1*  PLT 188 227   BMET Recent Labs    04/05/20 0619 04/06/20 0319  NA 142 142  K 3.4* 3.6  CL 111 111  CO2 19* 21*  GLUCOSE 120* 89  BUN 13 17  CREATININE 0.67 0.74  CALCIUM 7.9* 7.8*   PT/INR No results for input(s): LABPROT, INR in the last 72 hours. CMP     Component Value Date/Time   NA 142 04/06/2020 0319   K 3.6 04/06/2020 0319   CL 111 04/06/2020 0319   CO2 21 (L) 04/06/2020 0319   GLUCOSE 89 04/06/2020 0319   BUN 17 04/06/2020 0319    CREATININE 0.74 04/06/2020 0319   CALCIUM 7.8 (L) 04/06/2020 0319   PROT 5.1 (L) 04/01/2020 0436   ALBUMIN 3.2 (L) 04/01/2020 0436   AST 33 04/01/2020 0436   ALT 23 04/01/2020 0436   ALKPHOS 55 04/01/2020 0436   BILITOT 0.6 04/01/2020 0436   GFRNONAA >60 04/06/2020 0319   Lipase  No results found for: LIPASE     Studies/Results: DG Chest Port 1 View  Result Date: 04/04/2020 CLINICAL DATA:  Respiratory failure.  Recent fall. EXAM: PORTABLE CHEST 1 VIEW COMPARISON:  Chest radiograph 03/31/2020 and CT 03/31/2020 FINDINGS: A right jugular Port-A-Cath terminates over the mid SVC. Endotracheal tube has been removed. The cardiomediastinal silhouette is unchanged. There are greatly increased airspace opacities in the left mid and lower lung including dense retrocardiac consolidation, and there is also mild right basilar airspace opacity. There is a small left pleural effusion. No pneumothorax is identified. Left rib and right humerus fractures are again noted. IMPRESSION: 1. Interval extubation. 2. Worsening airspace opacities in the left mid lung and left greater than right lung bases which could reflect a combination of contusion, pneumonia, and atelectasis with a small left pleural effusion. Electronically Signed   By: Logan Bores M.D.   On: 04/04/2020 11:39    Anti-infectives: Anti-infectives (From admission, onward)   None       Assessment/Plan  Fall from attic, ~81ft  Right proximal humerus fx- Ortho has evaluated. Non-Op. Per Dr. Doreatha Martin. NWB. Sling  Right posterior thigh contusion Acute hypoxic respiratory failure-extubated 1/30. Having pulm symptoms subjectively 2/1, CXR with infiltrate, resp cx pending. Await S&S and start a short course of abx based on culture data. R hemiparesis- CTA of the head and neck showed no arterial injury - motor strip injury on brain per Dr. Zada Finders, improving exam, TBI team therapies C2-4 TVP Fx- Dr. Zada Finders consulted and noted stable  fracture pattern, cleared for c collar removal HTN- prn meds for now HLD Marginal zone Lymphoma- followed by Dr. Marin Olp. Received Rituxan every 3 months Chronic neck/back pain- followed by Dr. Christella Noa Mild AKI-resolved  FEN-FLD, cont SLP - ok to advance when cleared by SLP VTE-Lovenox ID - no leukocytosis or fever, resp cx pending   Dispo- CIR following, medically stable for discharge  LOS: 6 days    Norm Parcel , [facility] 04/06/2020, 8:02 AM Please see Amion for pager number during day hours 7:00am-4:30pm

## 2020-04-07 ENCOUNTER — Inpatient Hospital Stay (HOSPITAL_COMMUNITY): Payer: Medicare Other

## 2020-04-07 LAB — CULTURE, RESPIRATORY W GRAM STAIN

## 2020-04-07 LAB — CBC
HCT: 27.6 % — ABNORMAL LOW (ref 36.0–46.0)
Hemoglobin: 9.1 g/dL — ABNORMAL LOW (ref 12.0–15.0)
MCH: 27.1 pg (ref 26.0–34.0)
MCHC: 33 g/dL (ref 30.0–36.0)
MCV: 82.1 fL (ref 80.0–100.0)
Platelets: 267 10*3/uL (ref 150–400)
RBC: 3.36 MIL/uL — ABNORMAL LOW (ref 3.87–5.11)
RDW: 15.9 % — ABNORMAL HIGH (ref 11.5–15.5)
WBC: 9.7 10*3/uL (ref 4.0–10.5)
nRBC: 0.7 % — ABNORMAL HIGH (ref 0.0–0.2)

## 2020-04-07 MED ORDER — CEFEPIME HCL 2 G IJ SOLR
2.0000 g | Freq: Two times a day (BID) | INTRAMUSCULAR | Status: DC
  Administered 2020-04-07 – 2020-04-08 (×3): 2 g via INTRAVENOUS
  Filled 2020-04-07 (×3): qty 2

## 2020-04-07 MED ORDER — FUROSEMIDE 10 MG/ML IJ SOLN
40.0000 mg | Freq: Once | INTRAMUSCULAR | Status: AC
  Administered 2020-04-07: 40 mg via INTRAVENOUS
  Filled 2020-04-07: qty 4

## 2020-04-07 NOTE — Progress Notes (Signed)
Inpatient Rehab Admissions Coordinator:   I have no beds available for this patient to admit to CIR today.  Will continue to follow for timing of potential admission pending bed availability.   Shann Medal, PT, DPT Admissions Coordinator [phone] 04/07/20  11:18 AM

## 2020-04-07 NOTE — Progress Notes (Signed)
Occupational Therapy Treatment Patient Details Name: Amanda [name] MRN: [id number] DOB: March 25, 1940 Today's Date: 04/07/2020    History of present illness 80 y.o. female who presented via EMS as a level 1 trauma after patient reportedly fell ~10 ft from her attic. Pt intubated in the trauma bay due to depressed GCS. Pt found to have proximal R humerus fx, C2-4 TP fx. Brain MRI demonstrates L diffusion changes in the motor strip and superior frontal gyrus. Pt extubated on 04/02/2020.   OT comments  Pt making steady progress towards OT goals this session. Session focus on functional mobility and BADL reeducation. Pt able to complete functional mobility with MIN A +1 into BR to complete standing oral care with min guard for safety. Pt requires MOD A for LB ADLS from EOB. Pt continues to present with increased pain, balance impairments, WB restrictions and decreased activity tolerance. Pt would continue to benefit from skilled occupational therapy while admitted and after d/c to address the below listed limitations in order to improve overall functional mobility and facilitate independence with BADL participation. DC plan remains appropriate, will follow acutely per POC.     Follow Up Recommendations  CIR    Equipment Recommendations  3 in 1 bedside commode    Recommendations for Other Services      Precautions / Restrictions Precautions Precautions: Fall Precaution Comments: collar d/c per chart 2/2 Ostergard/ lovick notes, okay for wrist and finger movement in relation to ADLs as long shoulder is immobilized and remains in sling ( per Dr. Doreatha Martin 2/4) Required Braces or Orthoses: Sling Restrictions Weight Bearing Restrictions: Yes RUE Weight Bearing: Non weight bearing       Mobility Bed Mobility Overal bed mobility: Needs Assistance Bed Mobility: Supine to Sit;Sit to Supine     Supine to sit: Min assist;HOB elevated Sit to supine: Min guard;HOB elevated   General bed mobility  comments: light MIN A for supine>sitting as pt reaching out for therapist to elevate trunk into sitting, minguard to return to supine for safety  Transfers Overall transfer level: Needs assistance Equipment used: 1 person hand held assist Transfers: Sit to/from Stand Sit to Stand: Min assist         General transfer comment: light MIN A to rise into standing for initial steadying assist, x2 sit<>stands from EOB and BSC    Balance Overall balance assessment: Needs assistance Sitting-balance support: Single extremity supported;Feet supported Sitting balance-Leahy Scale: Fair Sitting balance - Comments: reliant on UE support of bed   Standing balance support: During functional activity;No upper extremity supported Standing balance-Leahy Scale: Fair Standing balance comment: pt able to complete ADL tasks at sink with no UE support with no LOB                           ADL either performed or assessed with clinical judgement   ADL Overall ADL's : Needs assistance/impaired     Grooming: Wash/dry face;Oral care;Standing;Min guard;Cueing for compensatory techniques Grooming Details (indicate cue type and reason): cues for compensatory methods in relation to using RUE as stabilizer. min guard for balance during dynamic ADL tasks             Lower Body Dressing: Moderate assistance;Sitting/lateral leans Lower Body Dressing Details (indicate cue type and reason): MOD A to don socks from EOB, education on LB AE would be benefical for next session Toilet Transfer: Minimal assistance;Ambulation Toilet Transfer Details (indicate cue type and reason): simulated via functional  mobility in room, MIN A +1 for hand held assist on LUE         Functional mobility during ADLs: Minimal assistance General ADL Comments: pt continues to present with decreased activity tolerance, pain, and RUE WB restrictions     Vision       Perception     Praxis      Cognition  Arousal/Alertness: Awake/alert Behavior During Therapy: WFL for tasks assessed/performed Overall Cognitive Status: Within Functional Limits for tasks assessed                                 General Comments: very pleasant, slightly slow to reposnd but feel pt internally distracted by pain        Exercises Exercises: Other exercises Other Exercises Other Exercises: x5 sit-stand from chair. minA to minG with reps. increased time to power up   Shoulder Instructions       General Comments 1L O2 removed for activity, able to maintain >90% with activity    Pertinent Vitals/ Pain       Pain Assessment: Faces Faces Pain Scale: Hurts little more Pain Location: "tailbone" when sitting in chair Pain Descriptors / Indicators: Grimacing Pain Intervention(s): Limited activity within patient's tolerance;Monitored during session;Repositioned;Other (comment) (assisted pt back to bed, per pt MD to order doughnut for sitting in chair)  Home Living                                          Prior Functioning/Environment              Frequency  Min 2X/week        Progress Toward Goals  OT Goals(current goals can now be found in the care plan section)  Progress towards OT goals: Progressing toward goals  Acute Rehab OT Goals Patient Stated Goal: to go to rehab to return to independent mobility OT Goal Formulation: Patient unable to participate in goal setting Time For Goal Achievement: 04/19/20 Potential to Achieve Goals: Fair  Plan Discharge plan remains appropriate;Frequency remains appropriate    Co-evaluation                 AM-PAC OT "6 Clicks" Daily Activity     Outcome Measure   Help from another person eating meals?: A Little Help from another person taking care of personal grooming?: A Little Help from another person toileting, which includes using toliet, bedpan, or urinal?: A Lot Help from another person bathing (including  washing, rinsing, drying)?: A Lot Help from another person to put on and taking off regular upper body clothing?: A Lot Help from another person to put on and taking off regular lower body clothing?: A Lot 6 Click Score: 14    End of Session    OT Visit Diagnosis: Unsteadiness on feet (R26.81);Muscle weakness (generalized) (M62.81);Pain Pain - Right/Left: Right Pain - part of body: Shoulder   Activity Tolerance Patient tolerated treatment well   Patient Left in bed;with call bell/phone within reach;with nursing/sitter in room   Nurse Communication Mobility status;Other (comment) (can get up to BR for toileting)        Time: [id number] OT Time Calculation (min): 38 min  Charges: OT General Charges $OT Visit: 1 Visit OT Treatments $Self Care/Home Management : 38-52 mins  Lenor DerrickMary Kate K., COTA/L Acute Rehabilitation Services [phone]  [phone]    Precious Haws 04/07/2020, 11:36 AM

## 2020-04-07 NOTE — Progress Notes (Addendum)
  Speech Language Pathology Treatment: Dysphagia  Patient Details Name: Amanda [name] MRN: [id number] DOB: [birth year] Today's Date: 04/07/2020 Time: 1497-0263 SLP Time Calculation (min) (ACUTE ONLY): 10 min  Assessment / Plan / Recommendation Clinical Impression  Pt seen for ongoing dysphagia management.  Pt tolerated regular texture solids and thin liquids without any clinical s/s of aspiration.  Pt noted to take small bites with deliberate mastication.  Pt exhibited good oral clearance given additional time and independently used liquid wash.  Pt denied any difficulty with meals. Recommend continuing regular texture diet and thin liquids.  SLP will sign off for swallowing at this time.    Cognition was not directly addressed this session 2/2 pt needing care from RN and assistance with toileting.  Pt may benefit from continued ST intervention in CIR setting.    HPI HPI: Amanda [name] is an 80 y.o. female with a Cervical spine fracture (C2-4 TVP) after a 10 ft fall from the attic. Also sustained motor strip injury on brain with right hemiparesis. right proximal humerus fx, Intubated from 1/28 to 1/30.      SLP Plan  Goals updated       Recommendations  Diet recommendations: Regular;Thin liquid Liquids provided via: Cup;Straw Medication Administration: Whole meds with liquid Supervision: Patient able to self feed;Intermittent supervision to cue for compensatory strategies Compensations: Slow rate;Small sips/bites Postural Changes and/or Swallow Maneuvers: Seated upright 90 degrees                Oral Care Recommendations: Oral care BID Follow up Recommendations: Inpatient Rehab SLP Visit Diagnosis: Dysphagia, unspecified (R13.10) Plan: Goals updated       [place], [place], [place] Office: [phone] 04/07/2020, 11:50 AM

## 2020-04-07 NOTE — Progress Notes (Signed)
Orthopedic Tech Progress Note Patient Details:  Amanda [name] [birth year] [id number] RN called requesting a NEW ARM SLING. Patient other sling was soiled. While in room I changed out patient canister for her urine. It was 1054ml. Told RN Ortho Devices Type of Ortho Device: Arm sling Ortho Device/Splint Location: RUE Ortho Device/Splint Interventions: Application,Adjustment   Post Interventions Patient Tolerated: Well Instructions Provided: Care of [place] 04/07/2020, 1:44 PM

## 2020-04-07 NOTE — PMR Pre-admission (Addendum)
PMR Admission Coordinator Pre-Admission Assessment  Patient: Amanda [name] is an 80 y.o., female MRN: [id number] DOB: February 25, 1941 Height: 5' (152.4 cm) Weight: 70 kg              Insurance Information HMO:     PPO:      PCP:      IPA:      80/20:      OTHER:  PRIMARY: Medicare A and B      Policy#: [id number]      Subscriber: pt CM Name:       Phone#:      Fax#:  Pre-Cert#: verified online      Employer:  Benefits:  Phone #:      Name:  Eff. Date: A and B 12/03/2003     Deduct: $1556      Out of Pocket Max: n/a      Life Max: n/a  CIR: 100%      SNF: 20 full days  Outpatient: 80%     Co-Ins: 20% Home Health: 100%      Co-Pay:  DME: 80%     Co-Ins: 20% Providers: pt choice SECONDARY: [employer]      Policy#: [id number]      Phone#: [phone]  Financial Counselor:       Phone#:   The Data Collection Information Summary for patients in Inpatient Rehabilitation Facilities with attached Privacy Act [place] Records was provided and verbally reviewed with: Patient and Family  Emergency Contact Information Contact Information     Name Relation Home Work Mobile   Hovanec,[name] Spouse   [phone]   [name],[name] Daughter   [phone]   [name],[name] Daughter   [phone]      Current Medical History  Patient Admitting Diagnosis: TBI with polytrauma  History of Present Illness: Amanda [name] is an 80yo female who presented to [facility] 1/28 as a level 1 trauma after falling about 10 feet from her attic. This was heard by her husband. On arrival patients GCS 7 and she was not moving her right side, intubated in the ED for airway protection. She was noted to have a deformity of the right side of her chest wall at her shoulder with a large bruise. Patient was intubated in the trauma bay due to depressed GCS. Pelvic and CXR unremarkable besides proximal right humerus fx. Patient was taken to the CT scanner. Work up showed Right proximal humerus fracture, Right posterior thigh  contusion, Acute hypoxic respiratory failure, Right hemiparesis, and C2-4 TVP Fracture. Orthopedics was consulted for humeral fracture and recommended conservative management, NWB in sling.  Neurosurgery consulted for hemiparesis and Cspine fractures and recommended non operative management without bracing.  Pt was successfully extubated on 1/30 and is currently on room air.  Possible aspiration on 2/1 and pt started on cefepime.  Therapy evaluations were completed and patient was recommended for CIR.     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  Prior Rehab/Hospitalizations:  Has the patient had prior rehab or hospitalizations prior to admission? Yes  Has the patient had  major surgery during 100 days prior to admission? No  Current Medications   Current Facility-Administered Medications:    0.9 %  sodium chloride infusion (Manually program via Guardrails IV Fluids), , Intravenous, Once, Georganna Skeans, MD   0.9 %  sodium chloride infusion, , Intravenous, PRN, Jesusita Oka, MD, Stopped at 04/04/20 1445   acetaminophen (TYLENOL) tablet 1,000 mg, 1,000 mg, Oral, Q6H, Lovick, Montel Culver, MD, 1,000 mg at 04/08/20 0546   albuterol (PROVENTIL) (2.5 MG/3ML) 0.083% nebulizer solution 2.5 mg, 2.5 mg, Nebulization, Q6H PRN, Romana Juniper A, MD, 2.5 mg at 04/05/20 0400   busPIRone (BUSPAR) tablet 7.5 mg, 7.5 mg, Oral, TID PRN, Jesusita Oka, MD, 7.5 mg at 04/08/20 0434   ceFEPIme (MAXIPIME) 2 g in sodium chloride 0.9 % 100 mL IVPB, 2 g, Intravenous, Q12H, Norm Parcel, PA-C, Last Rate: 200 mL/hr at 04/08/20 1034, 2 g at 04/08/20  1034   Chlorhexidine Gluconate Cloth 2 % PADS 6 each, 6 each, Topical, Daily, Jesusita Oka, MD, 6 each at 04/08/20 1034   docusate sodium (COLACE) capsule 100 mg, 100 mg, Oral, BID, Jesusita Oka, MD, 100 mg at 04/07/20 1110   enoxaparin (LOVENOX) injection 30 mg, 30 mg, Subcutaneous, Q12H, Georganna Skeans, MD, 30 mg at 04/08/20 1030   hydrALAZINE (APRESOLINE) injection 10 mg, 10 mg, Intravenous, Q2H PRN, Jillyn Ledger, PA-C, 10 mg at 04/08/20 0345   methocarbamol (ROBAXIN) tablet 1,000 mg, 1,000 mg, Oral, Q8H, Lovick, Montel Culver, MD, 1,000 mg at 04/08/20 0546   metoprolol tartrate (LOPRESSOR) injection 5 mg, 5 mg, Intravenous, Q6H PRN, Jillyn Ledger, PA-C, 5 mg at 04/04/20 0914   ondansetron (ZOFRAN-ODT) disintegrating tablet 4 mg, 4 mg, Oral, Q6H PRN, 4 mg at 04/08/20 0734 **OR** ondansetron (ZOFRAN) injection 4 mg, 4 mg, Intravenous, Q6H PRN, Jillyn Ledger, PA-C, 4 mg at 04/05/20 1805   oxyCODONE (Oxy IR/ROXICODONE) immediate release tablet 2.5-5 mg, 2.5-5 mg, Oral, Q6H PRN, Georganna Skeans, MD, 5 mg at 04/08/20 1028   polyethylene glycol (MIRALAX / GLYCOLAX) packet 17 g, 17 g, Oral, Daily, Lovick, Montel Culver, MD, 17 g at 04/07/20 1113   prochlorperazine (COMPAZINE) injection 5 mg, 5 mg, Intravenous, Q6H PRN, Stechschulte, Nickola Major, MD  Patients Current Diet:  Diet Order             Diet - low sodium heart healthy           Diet regular Room service appropriate? Yes; Fluid consistency: Thin  Diet effective now                   Precautions / Restrictions Precautions Precautions: Fall Precaution Comments: collar d/c per chart 2/2 Ostergard/ lovick notes, okay for wrist and finger movement in relation to ADLs as long shoulder is immobilized and remains in sling ( per Dr. Doreatha Martin 2/4) Restrictions Weight Bearing Restrictions: Yes RUE Weight Bearing: Non weight bearing   Has the patient had 2 or more falls or a fall with injury in the past year?Yes  Prior Activity  Level Community (5-7x/wk): Fully independent following CIR stay in 2020.  No DME used.  Prior Functional Level Prior Function Level of Independence: Independent  Self Care: Did the patient need help bathing, dressing, using the toilet or eating?  Independent  Indoor Mobility: Did the patient need assistance with walking from room to room (with or without device)? Independent  Stairs: Did the patient need assistance with internal or external stairs (with or without device)?  Independent  Functional Cognition: Did the patient need help planning regular tasks such as shopping or remembering to take medications? [place] / Equipment Home Assistive Devices/Equipment: Environmental consultant (specify type),Wheelchair (intermittent use) Home Equipment: Walker - 2 wheels,Wheelchair - manual,Bedside commode,Grab bars - tub/shower  Prior Device Use: Indicate devices/aids used by the patient prior to current illness, exacerbation or injury? None of the above  Current Functional Level Cognition  Arousal/Alertness: Awake/alert Overall Cognitive Status: Within Functional Limits for tasks assessed Orientation Level: Oriented X4 Safety/Judgement: Decreased awareness of safety,Decreased awareness of deficits General Comments: very pleasant, slightly slow to reposnd but feel pt internally distracted by pain Attention: Focused,Sustained,Selective Focused Attention: Appears intact Sustained Attention: Appears intact Selective Attention: Appears intact Memory: Impaired Memory Impairment: Retrieval deficit Awareness: Appears intact Awareness Impairment: Intellectual impairment,Emergent impairment Problem Solving: Appears intact    Extremity Assessment (includes Sensation/Coordination)  Upper Extremity Assessment: RUE deficits/detail RUE Deficits / Details: RUE sling arom digits elbow and wrist. pt does not activate without cues RUE Coordination: decreased fine motor,decreased gross  motor  Lower Extremity Assessment: Defer to PT evaluation    ADLs  Overall ADL's : Needs assistance/impaired Eating/Feeding: Maximal assistance,Sitting Eating/Feeding Details (indicate cue type and reason): decrease grasp on spoon Grooming: Wash/dry face,Oral care,Standing,Min guard,Cueing for compensatory techniques Grooming Details (indicate cue type and reason): cues for compensatory methods in relation to using RUE as stabilizer. min guard for balance during dynamic ADL tasks Upper Body Bathing: Total assistance Lower Body Bathing: Total assistance Upper Body Dressing : Total assistance Lower Body Dressing: Moderate assistance,Sitting/lateral leans Lower Body Dressing Details (indicate cue type and reason): MOD A to don socks from EOB, education on LB AE would be benefical for next session Toilet Transfer: Minimal Patent attorney Details (indicate cue type and reason): simulated via functional mobility in room, MIN A +1 for hand held assist on LUE Functional mobility during ADLs: Minimal assistance General ADL Comments: pt continues to present with decreased activity tolerance, pain, and RUE WB restrictions    Mobility  Overal bed mobility: Needs Assistance Bed Mobility: Supine to Sit,Sit to Supine Supine to sit: Min assist,HOB elevated Sit to supine: Min guard,HOB elevated General bed mobility comments: light MIN A for supine>sitting as pt reaching out for therapist to elevate trunk into sitting, minguard to return to supine for safety    Transfers  Overall transfer level: Needs assistance Equipment used: 1 person hand held assist Transfers: Sit to/from Stand Sit to Stand: Min assist Stand pivot transfers: Mod assist General transfer comment: light MIN A to rise into standing for initial steadying assist, x2 sit<>stands from EOB and BSC    Ambulation / Gait / Stairs / Wheelchair Mobility  Ambulation/Gait Ambulation/Gait assistance: Mod assist,Min  assist Gait Distance (Feet): 25 Feet (x3) Assistive device: 1 person hand held assist Gait Pattern/deviations: Step-to pattern,Decreased stride length,Wide base of support General Gait Details: modA at times due to increased lateral movement. small lateral steps with increased lateral sway requiring min-modA throughout Gait velocity: dcr    Posture / Balance Dynamic Sitting Balance Sitting balance - Comments: reliant on UE support of bed Balance Overall balance assessment: Needs assistance Sitting-balance support: Single extremity supported,Feet supported Sitting balance-Leahy Scale: Fair Sitting balance - Comments: reliant on UE support of bed Standing balance support: During functional activity,No upper extremity supported Standing balance-Leahy Scale: Fair Standing balance comment: pt able to complete ADL tasks at sink with no UE support with no LOB    Special needs/care consideration Behavioral  consideration TBI and Designated visitor [place] (from acute therapy documentation) Living Arrangements: Spouse/significant other  Lives With: Spouse Available Help at Discharge: Family,Available 24 hours/day Type of Home: House Home Layout: One level Home Access: Stairs to enter Entrance Stairs-Rails: [place] reach both Entrance Stairs-Number of Steps: 4 Bathroom Shower/Tub: Optometrist: Yes [place]: No Additional Comments: information obtain from PT eval  Discharge Living Setting Plans for Discharge Living Setting: Patient's home (can go to daughter's in [place]) Type of Home at Discharge: House Discharge Home Layout: One level Discharge Home Access: Stairs to enter Entrance Stairs-Rails: Can reach both Entrance Stairs-Number of Steps: 4 Discharge Bathroom Shower/Tub: Tub/shower unit Discharge Bathroom Toilet: Standard Discharge Bathroom Accessibility: Yes How Accessible:  Accessible via walker Does the patient have any problems obtaining your medications?: No  Social/Family/Support Systems Patient Roles: Spouse Anticipated Caregiver: [name] (spouse) Anticipated Ambulance person Information: [phone] (stayed with daughter, [name], after last admit [phone]) Ability/Limitations of Caregiver: [name] can provide up to min guard, uses no DME in the home but [facility] outside of the home. Caregiver Availability: 24/7 Discharge Plan Discussed with Primary Caregiver: Yes Is Caregiver In Agreement with Plan?: Yes Does Caregiver/Family have Issues with Lodging/Transportation while Pt is in Rehab?: No   Goals Patient/Family Goal for Rehab: PT/OT/SLP supervision/Min A Expected length of stay: 14-17 days Additional Information: previous CIR admit, Dr. Naaman Plummer team, 12/2018 Pt/Family Agrees to Admission and willing to participate: Yes Program Orientation Provided & Reviewed with Pt/Caregiver Including Roles  & Responsibilities: Yes   Decrease burden of Care through IP rehab admission: n/a  Possible need for SNF placement upon discharge:Not anticipated   Patient Condition: This patient's medical and functional status has changed since the consult dated: 04/05/20 in which the Rehabilitation Physician determined and documented that the patient's condition is appropriate for intensive rehabilitative care in an inpatient rehabilitation facility. See "History of Present Illness" (above) for medical update. Functional changes are: Pt. Min-mod A with transfers, ADLs and ambulation. Patient's medical and functional status update has been discussed with the Rehabilitation physician and patient remains appropriate for inpatient rehabilitation. Will admit to inpatient rehab on 04/08/2020.  Preadmission Screen Completed By: Shann Medal, PT, DPT with day of admit updates by Genella Mech, CCC-SLP, DPT 04/08/2020 11:42 AM with updates by Clemens Catholic, MS  CCC-SLP ______________________________________________________________________   Discussed status with Dr. Posey Pronto on 04/08/20 at 40 and received approval for admission 04/08/20.  Admission Coordinator: Shann Medal, PT, DPT &  Genella Mech, time [phone]  Sudie Grumbling 04/08/20

## 2020-04-07 NOTE — Progress Notes (Signed)
Progress Note     Subjective: Patient subjectively reports increased work of breathing this AM although appears nonlabored. She reports abdomen feels a little bloated but she is passing flatus and having bowel function. She denies nausea or abdominal pain. She is hopeful to get to CIR soon.   Objective: Vital signs in last 24 hours: Temp:  [97.7 F (36.5 C)-99.2 F (37.3 C)] 97.7 F (36.5 C) (02/04 0811) Pulse Rate:  [63-72] 63 (02/04 0811) Resp:  [14-24] 20 (02/04 0811) BP: (154-177)/(72-84) 177/79 (02/04 0811) SpO2:  [94 %-97 %] 94 % (02/04 0811) Last BM Date: 04/06/20  Intake/Output from previous day: 02/03 0701 - 02/04 0700 In: 550 [P.O.:550] Out: 200 [Urine:200] Intake/Output this shift: Total I/O In: 300 [P.O.:300] Out: 400 [Urine:400]  PE: General: pleasant, WD, WN female who is laying in bed in NAD HEENT: head is normocephalic, atraumatic.  Sclera are noninjected.  PERRL.  Ears and nose without any masses or lesions.  Mouth is pink and moist Heart: regular, rate, and rhythm. Palpable radial and pedal pulses bilaterally Lungs: lungs CTAB.  Respiratory effort nonlabored. On 2L via [place] Abd: soft, NT, ND, +BS, no masses, hernias, or organomegaly MS: RUE with ecchymosis and mild edema, sling not present in bed Skin: warm and dry with no masses, lesions, or rashes Neuro: Cranial nerves 2-12 grossly intact, sensation is normal throughout Psych: A&Ox3 with an appropriate affect.   Lab Results:  Recent Labs    04/06/20 0319 04/07/20 0201  WBC 9.7 9.7  HGB 8.8* 9.1*  HCT 25.1* 27.6*  PLT 227 267   BMET Recent Labs    04/05/20 0619 04/06/20 0319  NA 142 142  K 3.4* 3.6  CL 111 111  CO2 19* 21*  GLUCOSE 120* 89  BUN 13 17  CREATININE 0.67 0.74  CALCIUM 7.9* 7.8*   PT/INR No results for input(s): LABPROT, INR in the last 72 hours. CMP     Component Value Date/Time   NA 142 04/06/2020 0319   K 3.6 04/06/2020 0319   CL 111 04/06/2020 0319   CO2 21  (L) 04/06/2020 0319   GLUCOSE 89 04/06/2020 0319   BUN 17 04/06/2020 0319   CREATININE 0.74 04/06/2020 0319   CALCIUM 7.8 (L) 04/06/2020 0319   PROT 5.1 (L) 04/01/2020 0436   ALBUMIN 3.2 (L) 04/01/2020 0436   AST 33 04/01/2020 0436   ALT 23 04/01/2020 0436   ALKPHOS 55 04/01/2020 0436   BILITOT 0.6 04/01/2020 0436   GFRNONAA >60 04/06/2020 0319   Lipase  No results found for: LIPASE     Studies/Results: No results found.  Anti-infectives: Anti-infectives (From admission, onward)   Start     Dose/Rate Route Frequency Ordered Stop   04/07/20 1400  ceFEPIme (MAXIPIME) 2 g in sodium chloride 0.9 % 100 mL IVPB        2 g 200 mL/hr over 30 Minutes Intravenous Every 8 hours 04/07/20 0909 04/12/20 1359       Assessment/Plan Fall from attic, ~77ft  Right proximal humerus fx- Ortho has evaluated. Non-Op. Per Dr. Doreatha Martin. NWB. Sling  Right posterior thigh contusion Acute hypoxic respiratory failure-extubated 1/30. Having pulm symptoms subjectively 2/1, CXR with infiltrate, resp cx with pseudomonas. Starting 5d cefepime. R hemiparesis- CTA of the head and neck showed no arterial injury - motor strip injury on brain per Dr. Zada Finders, improving exam, TBI team therapies C2-4 TVP Fx- Dr. Zada Finders consulted and noted stable fracture pattern, cleared for c collar removal HTN-  prn meds for now HLD Marginal zone Lymphoma- followed by Dr. Marin Olp. Received Rituxan every 3 months Chronic neck/back pain- followed by Dr. Christella Noa Mild AKI-resolved  FEN-reg diet, SLIV VTE-Lovenox ID - cefepime x5 days for pseudomonas on resp cx  Dispo- CIR following, medically stable for discharge  LOS: 7 days    Norm Parcel , [facility] Surgery 04/07/2020, 9:10 AM Please see Amion for pager number during day hours 7:00am-4:30pm

## 2020-04-07 NOTE — Progress Notes (Signed)
Physical Therapy Treatment Patient Details Name: Amanda [name] MRN: [id number] DOB: [birth year] Today's Date: 04/07/2020    History of Present Illness 80 y.o. female who presented via EMS as a level 1 trauma after patient reportedly fell ~10 ft from her attic. Pt intubated in the trauma bay due to depressed GCS. Pt found to have proximal R humerus fx, C2-4 TP fx. Brain MRI demonstrates L diffusion changes in the motor strip and superior frontal gyrus. Pt extubated on 04/02/2020.    PT Comments    The pt continues to make good progress with mobility and functional transfers at this time. She was able to complete x8 sit-stand transfers through the session with progression from modA to minG at times. The pt continues to require increased time to power up as well as cues for hand placement and use of legs. The pt also was able to progress walking distance in the room, but continues to require multiple seated rest breaks to recover after ~52ft ambulation with min-modA of 1. The pt continues to be a good candidate for CIR level therapies, and will benefit from skilled PT acutely to continue to progress functional endurance, power, strength, and stability to facilitate safe return to independence.     Follow Up Recommendations  CIR     Equipment Recommendations  Other (comment) (defer to post acute)    Recommendations for Other Services       Precautions / Restrictions Precautions Precautions: Fall Precaution Comments: collar d/c per chart 2/2 Ostergard/ lovick notes Required Braces or Orthoses: Sling Restrictions Weight Bearing Restrictions: Yes RUE Weight Bearing: Non weight bearing    Mobility  Bed Mobility Overal bed mobility: Needs Assistance Bed Mobility: Supine to Sit     Supine to sit: Min assist;HOB elevated     General bed mobility comments: minA to pull to elevate trunk from elevated HOB. able to move BLE without assist, but continued cues to not use  RUE  Transfers Overall transfer level: Needs assistance Equipment used: 1 person hand held assist Transfers: Sit to/from Stand Sit to Stand: Min assist         General transfer comment: modA to power up initially, but able to progress to minA with reps through session. x7 through session with x5 in a row from chair.  Ambulation/Gait Ambulation/Gait assistance: Mod assist;Min assist Gait Distance (Feet): 25 Feet (x3) Assistive device: 1 person hand held assist Gait Pattern/deviations: Step-to pattern;Decreased stride length;Wide base of support Gait velocity: dcr   General Gait Details: modA at times due to increased lateral movement. small lateral steps with increased lateral sway requiring min-modA throughout       Balance Overall balance assessment: Needs assistance Sitting-balance support: Single extremity supported;Feet supported Sitting balance-Leahy Scale: Fair Sitting balance - Comments: reliant on UE support of bed   Standing balance support: Single extremity supported;During functional activity Standing balance-Leahy Scale: Poor Standing balance comment: reliant on external assist                            Cognition Arousal/Alertness: Awake/alert Behavior During Therapy: WFL for tasks assessed/performed Overall Cognitive Status: Within Functional Limits for tasks assessed                                 General Comments: slightly slowed processing and benefits from increased time, but able to maintain functional conversation about many topics, able  to voice needs      Exercises Other Exercises Other Exercises: x5 sit-stand from chair. minA to minG with reps. increased time to power up    General Comments General comments (skin integrity, edema, etc.): 1L O2 removed for activity, able to maintain >92% with activity      Pertinent Vitals/Pain Pain Assessment: Faces Faces Pain Scale: Hurts a little bit Pain Location: RUE Pain  Descriptors / Indicators: Grimacing Pain Intervention(s): Limited activity within patient's tolerance;Monitored during session;Repositioned           PT Goals (current goals can now be found in the care plan section) Acute Rehab PT Goals Patient Stated Goal: to go to rehab to return to independent mobility PT Goal Formulation: With patient/family Time For Goal Achievement: 04/18/20 Potential to Achieve Goals: Good Progress towards PT goals: Progressing toward goals    Frequency    Min 4X/week      PT Plan Current plan remains appropriate       AM-PAC PT "6 Clicks" Mobility   Outcome Measure  Help needed turning from your back to your side while in a flat bed without using bedrails?: A Lot Help needed moving from lying on your back to sitting on the side of a flat bed without using bedrails?: A Lot Help needed moving to and from a bed to a chair (including a wheelchair)?: A Lot Help needed standing up from a chair using your arms (e.g., wheelchair or bedside chair)?: A Lot Help needed to walk in hospital room?: A Lot Help needed climbing 3-5 steps with a railing? : A Lot 6 Click Score: 12    End of Session Equipment Utilized During Treatment: Oxygen Activity Tolerance: Patient tolerated treatment well;Patient limited by fatigue Patient left: in chair;with call bell/phone within reach;with chair alarm set;with nursing/sitter in room Nurse Communication: Mobility status PT Visit Diagnosis: Unsteadiness on feet (R26.81);Other abnormalities of gait and mobility (R26.89)     Time: 8937-3428 PT Time Calculation (min) (ACUTE ONLY): 36 min  Charges:  $Gait Training: 8-22 mins $Therapeutic Activity: 8-22 mins                     Karma Ganja, PT, DPT   Acute Rehabilitation Department Pager #: [phone]   Otho Bellows 04/07/2020, 9:44 AM

## 2020-04-08 ENCOUNTER — Inpatient Hospital Stay (HOSPITAL_COMMUNITY)
Admission: RE | Admit: 2020-04-08 | Discharge: 2020-04-20 | DRG: 945 | Disposition: A | Discharge status: Home / Self Care | Payer: Medicare Other | Source: Intra-hospital | Attending: Physical Medicine & Rehabilitation | Admitting: Physical Medicine & Rehabilitation

## 2020-04-08 ENCOUNTER — Encounter: Payer: Self-pay | Admitting: Hematology & Oncology

## 2020-04-08 ENCOUNTER — Other Ambulatory Visit: Payer: Self-pay

## 2020-04-08 ENCOUNTER — Encounter (HOSPITAL_COMMUNITY): Payer: Self-pay | Admitting: Physical Medicine & Rehabilitation

## 2020-04-08 DIAGNOSIS — S42209A Unspecified fracture of upper end of unspecified humerus, initial encounter for closed fracture: Secondary | ICD-10-CM

## 2020-04-08 DIAGNOSIS — K219 Gastro-esophageal reflux disease without esophagitis: Secondary | ICD-10-CM | POA: Diagnosis not present

## 2020-04-08 DIAGNOSIS — Z888 Allergy status to other drugs, medicaments and biological substances status: Secondary | ICD-10-CM

## 2020-04-08 DIAGNOSIS — G8191 Hemiplegia, unspecified affecting right dominant side: Secondary | ICD-10-CM | POA: Diagnosis not present

## 2020-04-08 DIAGNOSIS — E876 Hypokalemia: Secondary | ICD-10-CM | POA: Diagnosis not present

## 2020-04-08 DIAGNOSIS — J40 Bronchitis, not specified as acute or chronic: Secondary | ICD-10-CM

## 2020-04-08 DIAGNOSIS — K5900 Constipation, unspecified: Secondary | ICD-10-CM | POA: Diagnosis present

## 2020-04-08 DIAGNOSIS — T07XXXA Unspecified multiple injuries, initial encounter: Secondary | ICD-10-CM | POA: Diagnosis present

## 2020-04-08 DIAGNOSIS — J209 Acute bronchitis, unspecified: Secondary | ICD-10-CM | POA: Diagnosis not present

## 2020-04-08 DIAGNOSIS — Z7982 Long term (current) use of aspirin: Secondary | ICD-10-CM

## 2020-04-08 DIAGNOSIS — S069X9D Unspecified intracranial injury with loss of consciousness of unspecified duration, subsequent encounter: Principal | ICD-10-CM

## 2020-04-08 DIAGNOSIS — B965 Pseudomonas (aeruginosa) (mallei) (pseudomallei) as the cause of diseases classified elsewhere: Secondary | ICD-10-CM | POA: Diagnosis not present

## 2020-04-08 DIAGNOSIS — W1789XD Other fall from one level to another, subsequent encounter: Secondary | ICD-10-CM | POA: Diagnosis not present

## 2020-04-08 DIAGNOSIS — S7001XD Contusion of right hip, subsequent encounter: Secondary | ICD-10-CM | POA: Diagnosis not present

## 2020-04-08 DIAGNOSIS — J9601 Acute respiratory failure with hypoxia: Secondary | ICD-10-CM | POA: Diagnosis present

## 2020-04-08 DIAGNOSIS — S069X9A Unspecified intracranial injury with loss of consciousness of unspecified duration, initial encounter: Secondary | ICD-10-CM

## 2020-04-08 DIAGNOSIS — S069XAA Unspecified intracranial injury with loss of consciousness status unknown, initial encounter: Secondary | ICD-10-CM

## 2020-04-08 DIAGNOSIS — R4189 Other symptoms and signs involving cognitive functions and awareness: Secondary | ICD-10-CM | POA: Diagnosis not present

## 2020-04-08 DIAGNOSIS — S12300D Unspecified displaced fracture of fourth cervical vertebra, subsequent encounter for fracture with routine healing: Secondary | ICD-10-CM

## 2020-04-08 DIAGNOSIS — S42211A Unspecified displaced fracture of surgical neck of right humerus, initial encounter for closed fracture: Secondary | ICD-10-CM | POA: Diagnosis not present

## 2020-04-08 DIAGNOSIS — S069X2S Unspecified intracranial injury with loss of consciousness of 31 minutes to 59 minutes, sequela: Secondary | ICD-10-CM | POA: Diagnosis not present

## 2020-04-08 DIAGNOSIS — Z881 Allergy status to other antibiotic agents status: Secondary | ICD-10-CM

## 2020-04-08 DIAGNOSIS — S12200D Unspecified displaced fracture of third cervical vertebra, subsequent encounter for fracture with routine healing: Secondary | ICD-10-CM

## 2020-04-08 DIAGNOSIS — Z79890 Hormone replacement therapy (postmenopausal): Secondary | ICD-10-CM

## 2020-04-08 DIAGNOSIS — Z79899 Other long term (current) drug therapy: Secondary | ICD-10-CM | POA: Diagnosis not present

## 2020-04-08 DIAGNOSIS — S2242XD Multiple fractures of ribs, left side, subsequent encounter for fracture with routine healing: Secondary | ICD-10-CM

## 2020-04-08 DIAGNOSIS — D62 Acute posthemorrhagic anemia: Secondary | ICD-10-CM | POA: Diagnosis present

## 2020-04-08 DIAGNOSIS — Z7952 Long term (current) use of systemic steroids: Secondary | ICD-10-CM

## 2020-04-08 DIAGNOSIS — S069X0D Unspecified intracranial injury without loss of consciousness, subsequent encounter: Secondary | ICD-10-CM

## 2020-04-08 DIAGNOSIS — E785 Hyperlipidemia, unspecified: Secondary | ICD-10-CM | POA: Diagnosis present

## 2020-04-08 DIAGNOSIS — I10 Essential (primary) hypertension: Secondary | ICD-10-CM

## 2020-04-08 DIAGNOSIS — S069X1S Unspecified intracranial injury with loss of consciousness of 30 minutes or less, sequela: Secondary | ICD-10-CM | POA: Diagnosis not present

## 2020-04-08 DIAGNOSIS — Z09 Encounter for follow-up examination after completed treatment for conditions other than malignant neoplasm: Secondary | ICD-10-CM

## 2020-04-08 DIAGNOSIS — Z79891 Long term (current) use of opiate analgesic: Secondary | ICD-10-CM

## 2020-04-08 DIAGNOSIS — E039 Hypothyroidism, unspecified: Secondary | ICD-10-CM | POA: Diagnosis present

## 2020-04-08 DIAGNOSIS — S42201D Unspecified fracture of upper end of right humerus, subsequent encounter for fracture with routine healing: Secondary | ICD-10-CM

## 2020-04-08 DIAGNOSIS — M7989 Other specified soft tissue disorders: Secondary | ICD-10-CM | POA: Diagnosis not present

## 2020-04-08 DIAGNOSIS — G894 Chronic pain syndrome: Secondary | ICD-10-CM

## 2020-04-08 DIAGNOSIS — S42291A Other displaced fracture of upper end of right humerus, initial encounter for closed fracture: Secondary | ICD-10-CM | POA: Diagnosis not present

## 2020-04-08 DIAGNOSIS — C8580 Other specified types of non-Hodgkin lymphoma, unspecified site: Secondary | ICD-10-CM

## 2020-04-08 DIAGNOSIS — C8590 Non-Hodgkin lymphoma, unspecified, unspecified site: Secondary | ICD-10-CM | POA: Diagnosis not present

## 2020-04-08 DIAGNOSIS — Z8616 Personal history of COVID-19: Secondary | ICD-10-CM | POA: Diagnosis not present

## 2020-04-08 MED ORDER — LEVOFLOXACIN 500 MG PO TABS: 500.0000 mg | ORAL_TABLET | Freq: Every day | ORAL | 0 refills | Status: DC

## 2020-04-08 MED ORDER — METHOCARBAMOL 500 MG PO TABS
1000.0000 mg | ORAL_TABLET | Freq: Three times a day (TID) | ORAL | Status: DC
  Administered 2020-04-08 – 2020-04-20 (×35): 1000 mg via ORAL
  Filled 2020-04-08 (×35): qty 2

## 2020-04-08 MED ORDER — BUPROPION HCL ER (XL) 150 MG PO TB24
150.0000 mg | ORAL_TABLET | Freq: Every day | ORAL | Status: DC
  Administered 2020-04-09 – 2020-04-20 (×12): 150 mg via ORAL
  Filled 2020-04-08 (×12): qty 1

## 2020-04-08 MED ORDER — ACETAMINOPHEN 325 MG PO TABS
650.0000 mg | ORAL_TABLET | Freq: Four times a day (QID) | ORAL | Status: DC | PRN
  Administered 2020-04-09 – 2020-04-20 (×13): 650 mg via ORAL
  Filled 2020-04-08 (×15): qty 2

## 2020-04-08 MED ORDER — PROCHLORPERAZINE EDISYLATE 10 MG/2ML IJ SOLN: 5.0000 mg | Freq: Four times a day (QID) | INTRAMUSCULAR | Status: DC | PRN

## 2020-04-08 MED ORDER — AMLODIPINE BESYLATE 5 MG PO TABS
5.0000 mg | ORAL_TABLET | Freq: Every day | ORAL | Status: DC
  Administered 2020-04-08 – 2020-04-11 (×4): 5 mg via ORAL
  Filled 2020-04-08 (×4): qty 1

## 2020-04-08 MED ORDER — PANTOPRAZOLE SODIUM 20 MG PO TBEC
20.0000 mg | DELAYED_RELEASE_TABLET | Freq: Every day | ORAL | Status: DC
  Administered 2020-04-08 – 2020-04-20 (×13): 20 mg via ORAL
  Filled 2020-04-08 (×13): qty 1

## 2020-04-08 MED ORDER — ASPIRIN EC 81 MG PO TBEC
81.0000 mg | DELAYED_RELEASE_TABLET | Freq: Every day | ORAL | Status: DC
  Administered 2020-04-09 – 2020-04-20 (×12): 81 mg via ORAL
  Filled 2020-04-08 (×12): qty 1

## 2020-04-08 MED ORDER — ENOXAPARIN SODIUM 30 MG/0.3ML ~~LOC~~ SOLN
30.0000 mg | Freq: Two times a day (BID) | SUBCUTANEOUS | Status: DC
  Administered 2020-04-08 – 2020-04-20 (×24): 30 mg via SUBCUTANEOUS
  Filled 2020-04-08 (×24): qty 0.3

## 2020-04-08 MED ORDER — LEVOTHYROXINE SODIUM 25 MCG PO TABS
125.0000 ug | ORAL_TABLET | Freq: Every day | ORAL | Status: DC
  Administered 2020-04-09 – 2020-04-20 (×12): 125 ug via ORAL
  Filled 2020-04-08 (×12): qty 1

## 2020-04-08 NOTE — Progress Notes (Signed)
Progress Note     Subjective: Complains of some nausea this morning otherwise doing well with motivation to participate with therapy.  Objective: Vital signs in last 24 hours: Temp:  [98.1 F (36.7 C)-98.3 F (36.8 C)] 98.1 F (36.7 C) (02/05 0724) Pulse Rate:  [60-77] 71 (02/05 0724) Resp:  [20-22] 20 (02/05 0724) BP: (147-180)/(69-77) 156/75 (02/05 0724) SpO2:  [93 %-97 %] 97 % (02/05 0724) Last BM Date: 04/07/20  Intake/Output from previous day: 02/04 0701 - 02/05 0700 In: 680 [P.O.:580; IV Piggyback:100] Out: 1200 [Urine:1200] Intake/Output this shift: Total I/O In: -  Out: 1600 [Urine:1600]  PE: General: pleasant, WD, WN female who is laying in bed in NAD HEENT: head is normocephalic, atraumatic.  Sclera are noninjected.  PERRL.  Ears and nose without any masses or lesions.  Mouth is pink and moist Heart: regular, rate, and rhythm. Palpable radial and pedal pulses bilaterally Lungs: lungs CTAB.  Respiratory effort nonlabored. On 2L via [place] Abd: soft, NT, ND, +BS, no masses, hernias, or organomegaly MS: RUE with ecchymosis and mild edema, sling not present in bed Skin: warm and dry with no masses, lesions, or rashes Neuro: Cranial nerves 2-12 grossly intact, sensation is normal throughout Psych: A&Ox3 with an appropriate affect.   Lab Results:  Recent Labs    04/06/20 0319 04/07/20 0201  WBC 9.7 9.7  HGB 8.8* 9.1*  HCT 25.1* 27.6*  PLT 227 267   BMET Recent Labs    04/06/20 0319  NA 142  K 3.6  CL 111  CO2 21*  GLUCOSE 89  BUN 17  CREATININE 0.74  CALCIUM 7.8*   PT/INR No results for input(s): LABPROT, INR in the last 72 hours. CMP     Component Value Date/Time   NA 142 04/06/2020 0319   K 3.6 04/06/2020 0319   CL 111 04/06/2020 0319   CO2 21 (L) 04/06/2020 0319   GLUCOSE 89 04/06/2020 0319   BUN 17 04/06/2020 0319   CREATININE 0.74 04/06/2020 0319   CALCIUM 7.8 (L) 04/06/2020 0319   PROT 5.1 (L) 04/01/2020 0436   ALBUMIN 3.2 (L)  04/01/2020 0436   AST 33 04/01/2020 0436   ALT 23 04/01/2020 0436   ALKPHOS 55 04/01/2020 0436   BILITOT 0.6 04/01/2020 0436   GFRNONAA >60 04/06/2020 0319   Lipase  No results found for: LIPASE     Studies/Results: DG CHEST PORT 1 VIEW  Result Date: 04/07/2020 CLINICAL DATA:  Healthcare associated pneumonia. EXAM: PORTABLE CHEST 1 VIEW COMPARISON:  04/04/2020 FINDINGS: 0922 hours. Retrocardiac left base collapse/consolidative opacity is similar to prior with small left pleural effusion. Similar but more modest airspace disease noted right base. Interstitial markings are diffusely coarsened with chronic features. Cardiopericardial silhouette is at upper limits of normal for size. Right Port-A-Cath again noted. Telemetry leads overlie the chest. IMPRESSION: No significant change in bibasilar airspace disease, left greater than right, with small left pleural effusion. Electronically Signed   By: Misty Stanley M.D.   On: 04/07/2020 09:37    Anti-infectives: Anti-infectives (From admission, onward)   Start     Dose/Rate Route Frequency Ordered Stop   04/07/20 1000  ceFEPIme (MAXIPIME) 2 g in sodium chloride 0.9 % 100 mL IVPB        2 g 200 mL/hr over 30 Minutes Intravenous Every 12 hours 04/07/20 0909 04/12/20 0959       Assessment/Plan Fall from attic, ~10ft  Right proximal humerus fx- Ortho has evaluated. Non-Op. Per Dr. Doreatha Martin.  NWB. Sling  Right posterior thigh contusion Acute hypoxic respiratory failure-extubated 1/30. Having pulm symptoms subjectively 2/1, CXR with infiltrate, resp cx with pseudomonas. Starting 5d cefepime. R hemiparesis- CTA of the head and neck showed no arterial injury - motor strip injury on brain per Dr. Zada Finders, improving exam, TBI team therapies C2-4 TVP Fx- Dr. 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 Received Rituxan every  3 months Chronic neck/back pain- followed by Dr. Christella Noa Mild AKI-resolved  FEN-reg diet, SLIV - adjusted nausea medicaitons VTE-Lovenox ID - cefepime x5 days for pseudomonas on resp cx  Dispo- CIR following, medically stable for discharge  LOS: 8 days    Felicie Morn , [facility] 04/08/2020, 9:58 AM Please see Amion for pager number during day hours 7:00am-4:30pm

## 2020-04-08 NOTE — H&P (Signed)
Physical Medicine and Rehabilitation Admission H&P    Chief complaint: Weakness  HPI: 80 year old female with past medical history of lymphoma, macular degeneration, bronchiectasis, hypertension, hypothyroidism, GERD presented on 03/31/2020 after a fall from her attic.  History taken from chart review and patient.  She fell approximately 10 feet and was noted to have right-sided weakness with AMS.  Her GCS was 7.  Work-up revealed left frontoparietal lobe diffusion abnormality, right C2-C4 transverse process fracture, left first and second rib fractures, right humerus fracture.  Hospital course further complicated by drop in hemoglobin to 6.9, which required transfusion of PRBCs.  Orthopedic surgery was consulted and recommended nonoperative management for right proximal humeral fracture with patient nonweightbearing and sling.  Right hemiparesis was thought to be secondary to motor strip injury per neurosurgery.  No need for cervical collar for cervical fracture per neurosurgery.  She did require to be seen, but was later extubated.  She was started on cefepime for Pseudomonas tracheobronchitis.  Hospital course further complicated by acute blood loss anemia with hemoglobin 9.1 on 04/07/2020.  Patient with associated pain and supplemental oxygen requirement.  She was admitted to inpatient rehab due to weakness, weightbearing precautions, cognitive deficits.  Please see preadmission assessment as well.  Review of Systems  Constitutional: Positive for malaise/fatigue.  Respiratory: Positive for shortness of breath.   Gastrointestinal: Positive for constipation and nausea. Negative for vomiting.  Musculoskeletal: Positive for falls, joint pain, myalgias and neck pain.  Neurological: Positive for focal weakness and weakness. Negative for sensory change and speech change.  All other systems reviewed and are negative.  Past Medical History:  Diagnosis Date  . Anemia   . Arthritis   . Bronchiectasis  ([place])   . Cancer ([place])   . Cervicalgia   . Constipation, chronic   . Essential hypertension   . GERD (gastroesophageal reflux disease)    zantac  . Heart murmur   . History of blood transfusion [place]  . Hyperlipidemia   . Hypertension   . Hyperthyroidism   . Hypothyroid   . Hypothyroidism   . Lumbar burst fracture ([place])   . Lymphoproliferative disorder ([place])   . Macular degeneration 2013   Both eyes   . Macular degeneration, bilateral   . 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[place])   . Osteopenia   . Pneumonia   . Pneumonia due to COVID-19 virus 2021   Required hospitalization  . PONV (postoperative nausea and vomiting)    needs little anesthesia  . Shingles   . Shortness of breath    on exertion  . Spleen enlarged   . SUI (stress urinary incontinence, female)   . Urinary, incontinence, stress female   . Wears glasses    Past Surgical History:  Procedure Laterality Date  . BREAST EXCISIONAL BIOPSY Left 1980  . CARPAL TUNNEL RELEASE  1999  . CATARACT EXTRACTION  2009, 2011   BOTH EYES  . CATARACT EXTRACTION, BILATERAL    . [place]  . CESAREAN SECTION    . COLONOSCOPY      Dr Cristina Gong  . DILATION AND CURETTAGE OF UTERUS     X2  . HYSTEROSCOPY WITH D & C  01/07/2012   Procedure: DILATATION AND CURETTAGE /HYSTEROSCOPY;  Surgeon: Terrance Mass, MD;  Location: [facility];  Service: Gynecology;  Laterality: N/A;  intrauterine foley catheter for tamponode   . IR IMAGING GUIDED PORT INSERTION  07/15/2018  . LYMPH NODE BIOPSY Left 05/26/2018  Procedure: LEFT AXILLARY LYMPH NODE BIOPSY;  Surgeon: Fanny Skates, MD;  Location: [facility];  Service: General;  Laterality: Left;  . ORIF ANKLE FRACTURE Left 12/12/2018  . ORIF ANKLE FRACTURE Left 12/12/2018   Procedure: OPEN REDUCTION INTERNAL FIXATION (ORIF) ANKLE FRACTURE;  Surgeon: Meredith Pel, MD;  Location: [facility];  Service: Orthopedics;  Laterality: Left;  . ORIF ANKLE FRACTURE Left 12/2018  . TONSILLECTOMY     . TONSILLECTOMY AND ADENOIDECTOMY    . TUBAL LIGATION     BY LAPAROSCOPY  . WISDOM TOOTH EXTRACTION     Family History  Problem Relation Age of Onset  . Ovarian cancer Mother   . Breast cancer Mother 33  . Hypertension Father   . Prostate cancer Father   . Kidney failure Father   . Diabetes Father   . Hyperlipidemia Brother   . COPD Paternal Grandfather   . Stroke Maternal Grandfather   . Hypercalcemia Neg Hx    Social History:  reports that she has never smoked. She has never used smokeless tobacco. She reports current alcohol use. She reports that she does not use drugs. Allergies:  Allergies  Allergen Reactions  . Phenergan [Promethazine Hcl] Other (See Comments)    Jerking/agitation  . Preservision Areds 2 [Multiple Vitamins-Minerals] Nausea Only  . Levaquin [Levofloxacin] Nausea And Vomiting  . Pravastatin Nausea And Vomiting  . Preservision Areds 2 [Multiple Vitamins-Minerals] Nausea Only  . Promethazine Other (See Comments)    "Agitation and jerking"  . Levofloxacin Nausea And Vomiting  . Pravastatin Nausea And Vomiting   Medications Prior to Admission  Medication Sig Dispense Refill  . acetaminophen (TYLENOL) 650 MG CR tablet Take 650-1,300 mg by mouth every 6 (six) hours as needed for pain. (Patient not taking: Reported on 02/29/2020)    . acetaminophen (TYLENOL) 650 MG CR tablet Take 650 mg by mouth 2 (two) times daily as needed for pain.    [name] albuterol (PROVENTIL) (2.5 MG/3ML) 0.083% nebulizer solution Take 2.5 mg by nebulization every 6 (six) hours as needed for wheezing or shortness of breath.    [name] amLODipine (NORVASC) 10 MG tablet Take 10 mg by mouth daily.    [name] amLODipine (NORVASC) 5 MG tablet Take 1 tablet (5 mg total) by mouth daily. 90 tablet 1  . aspirin 81 MG chewable tablet Chew 1 tablet (81 mg total) by mouth daily.    [name] aspirin EC 81 MG tablet Take 81 mg by mouth daily. Swallow whole.    [name] azithromycin (ZITHROMAX Z-PAK) 250 MG tablet As directed 6  each 0  . buPROPion (WELLBUTRIN XL) 150 MG 24 hr tablet Take 1 tablet (150 mg total) by mouth daily. 90 tablet 0  . buPROPion (WELLBUTRIN XL) 150 MG 24 hr tablet Take 150 mg by mouth daily.    . chlorpheniramine-HYDROcodone (TUSSIONEX) 10-8 MG/5ML SUER Take 5 mLs by mouth every 12 (twelve) hours as needed for cough. 70 mL 0  . famotidine (PEPCID) 20 MG tablet Take 1 tablet (20 mg total) by mouth daily as needed for heartburn or indigestion. 30 tablet 3  . famotidine (PEPCID) 20 MG tablet Take 20 mg by mouth daily as needed for heartburn or indigestion.    [name] levofloxacin (LEVAQUIN) 500 MG tablet Take 1 tablet (500 mg total) by mouth daily for 4 days. 4 tablet 0  . levothyroxine (SYNTHROID) 125 MCG tablet Take 1 tablet (125 mcg total) by mouth daily before breakfast. 90 tablet 1  . levothyroxine (SYNTHROID) 125 MCG  tablet Take 125 mcg by mouth daily before breakfast.    . moxifloxacin (AVELOX) 400 MG tablet Take 1 tablet (400 mg total) by mouth daily. 7 tablet 0  . Multiple Vitamins-Minerals (EYE VITAMINS PO) Take 1 tablet by mouth daily.    . Multiple Vitamins-Minerals (MACULAR HEALTH FORMULA) CAPS Take 1 capsule by mouth daily.    . Multiple Vitamins-Minerals (MACULAR VITAMIN BENEFIT) TABS Take 1 tablet by mouth daily with breakfast.    . NON FORMULARY Take 1 tablet by mouth See admin instructions. Calcium/D-3/Magnesium- Take 0.5 tablet by mouth two times a day    . omeprazole (PRILOSEC) 20 MG capsule TAKE 1 CAPSULE(20 MG) BY MOUTH DAILY 90 capsule 1  . ondansetron (ZOFRAN) 4 MG tablet Take 1 tablet (4 mg total) by mouth every 8 (eight) hours as needed for nausea or vomiting. (Patient not taking: Reported on 02/29/2020) 20 tablet 0  . ondansetron (ZOFRAN) 4 MG tablet Take 4 mg by mouth every 8 (eight) hours as needed for nausea or vomiting.    . predniSONE (DELTASONE) 10 MG tablet TAKE 3 TABLETS PO QD FOR 3 DAYS THEN TAKE 2 TABLETS PO QD FOR 3 DAYS THEN TAKE 1 TABLET PO QD FOR 3 DAYS THEN TAKE 1/2  TAB PO QD FOR 3 DAYS 20 tablet 0  . promethazine-dextromethorphan (PROMETHAZINE-DM) 6.25-15 MG/5ML syrup Take 5 mLs by mouth 4 (four) times daily as needed. (Patient not taking: Reported on 02/29/2020) 118 mL 0  . PROVENTIL HFA 108 (90 Base) MCG/ACT inhaler Inhale 1-2 puffs into the lungs every 6 (six) hours as needed for wheezing or shortness of breath.    . traZODone (DESYREL) 50 MG tablet Take 1 tablet (50 mg total) by mouth at bedtime as needed for sleep. 30 tablet 1    Home: Home Living Family/patient expects to be discharged to:: Private residence Living Arrangements: Spouse/significant other   Functional History:    Functional Status:  Mobility:          ADL:    Cognition:      Physical Exam: Blood pressure (!) 148/75, pulse 76, temperature 97.8 F (36.6 C), resp. rate 18, SpO2 94 %. Physical Exam Vitals reviewed.  Constitutional:      General: She is not in acute distress.    Appearance: She is normal weight.  HENT:     Head: Normocephalic and atraumatic.     Right Ear: External ear normal.     Left Ear: External ear normal.     Nose: Nose normal.  Eyes:     General:        Right eye: No discharge.        Left eye: No discharge.     Extraocular Movements: Extraocular movements intact.  Cardiovascular:     Rate and Rhythm: Normal rate and regular rhythm.  Pulmonary:     Effort: Pulmonary effort is normal. No respiratory distress.     Breath sounds: No stridor.     Comments: + [place] Abdominal:     General: Abdomen is flat. Bowel sounds are normal. There is no distension.  Musculoskeletal:     Cervical back: Tenderness present. No rigidity.     Comments: Right upper extremity with edema and tenderness  Skin:    General: Skin is warm and dry.  Neurological:     Mental Status: She is alert.     Comments: Alert Motor: Right upper extremity proximally limited by sling, handgrip 3/5 Left upper extremity: 5/5 proximal distal Right lower  extremity: 4 -/5  proximal to distal Left lower extremity: 4+/5 proximal distal  Psychiatric:        Mood and Affect: Mood normal.        Speech: Speech normal.     Results for orders placed or performed during the hospital encounter of 03/31/20 (from the past 48 hour(s))  CBC     Status: Abnormal   Collection Time: 04/07/20  2:01 AM  Result Value Ref Range   WBC 9.7 4.0 - 10.5 K/uL   RBC 3.36 (L) 3.87 - 5.11 MIL/uL   Hemoglobin 9.1 (L) 12.0 - 15.0 g/dL   HCT 27.6 (L) 36.0 - 46.0 %   MCV 82.1 80.0 - 100.0 fL   MCH 27.1 26.0 - 34.0 pg   MCHC 33.0 30.0 - 36.0 g/dL   RDW 15.9 (H) 11.5 - 15.5 %   Platelets 267 150 - 400 K/uL   nRBC 0.7 (H) 0.0 - 0.2 %    Comment: Performed at [facility], [address] [place], [place] [address]   DG CHEST PORT 1 VIEW  Result Date: 04/07/2020 CLINICAL DATA:  Healthcare associated pneumonia. EXAM: PORTABLE CHEST 1 VIEW COMPARISON:  04/04/2020 FINDINGS: 0922 hours. Retrocardiac left base collapse/consolidative opacity is similar to prior with small left pleural effusion. Similar but more modest airspace disease noted right base. Interstitial markings are diffusely coarsened with chronic features. Cardiopericardial silhouette is at upper limits of normal for size. Right Port-A-Cath again noted. Telemetry leads overlie the chest. IMPRESSION: No significant change in bibasilar airspace disease, left greater than right, with small left pleural effusion. Electronically Signed   By: Misty Stanley M.D.   On: 04/07/2020 09:37    Medical Problem List and Plan: 1.  Deficits with mobility, transfers, ADLs secondary to polytrauma with TBI.  Patient may shower  Assitance level/ELOS: Supervision/min a/14-17 days  Admit to CIR 2.  DVT Prophylaxis/Anticoagulation: Mechanical: Sequential compression devices, below knee Bilateral lower extremities Pharmaceutical: Lovenox.   Antiplatelets: Aspirin 81 3.  Acute on chronic pain Management:   Tylenol as needed  Robaxin 1000 mg  every 8 scheduled 4. Mood: Team support  Antipsychotic: Wellbutrin 5. Neuropsych: This patient is?  Fully capable of making decisions on her own behalf. 6. Skin/Wound Care: Routine skin care 7. Fluids/Electrolytes/Nutrition: Monitor I/Os.  CMP ordered for Monday 8.  Right proximal humeral fracture: Nonweightbearing per Ortho and sling. 9.  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  CTA of head/neck no arterial injury. 10.  C2-4 transverse process fracture: C-collar not required per neurosurgery 11.  Essential hypertension  Norvasc 5mg  daily  Monitor increase mobility 12.  Marginal zone lymphoma  Follow-up as outpatient with Dr. Jonette Eva 13.  Acute hypoxic respiratory failure: Tracheobronchitis respiratory cultures with Pseudomonas.    IV cefepime 2/4-2/8 14.  Constipation  Colace 100 mg twice daily  Increase bowel meds as necessary 15.  Hypothyroidism: Synthroid 125 daily 16.  GERD: Protonix 20 mg daily  04/08/2020  The patient's status has not changed. Any changes from the pre-admission screening are noted above.   Delice Lesch, MD, ABPMR

## 2020-04-08 NOTE — Progress Notes (Addendum)
Inpatient Rehab Admissions Coordinator:    I have a CIR bed for this Pt. Today. ,Dr. Lanny Hurst to enter  for d/c orders.  RN may call [phone] for report.  Clemens Catholic, [place], [place] Admissions Coordinator  [phone] ([place]) [phone] (office)

## 2020-04-09 DIAGNOSIS — I10 Essential (primary) hypertension: Secondary | ICD-10-CM | POA: Diagnosis not present

## 2020-04-09 DIAGNOSIS — G894 Chronic pain syndrome: Secondary | ICD-10-CM | POA: Diagnosis not present

## 2020-04-09 DIAGNOSIS — S069X0D Unspecified intracranial injury without loss of consciousness, subsequent encounter: Secondary | ICD-10-CM | POA: Diagnosis not present

## 2020-04-09 DIAGNOSIS — T07XXXA Unspecified multiple injuries, initial encounter: Secondary | ICD-10-CM | POA: Diagnosis not present

## 2020-04-09 MED ORDER — SODIUM CHLORIDE 0.9 % IV SOLN
2.0000 g | Freq: Two times a day (BID) | INTRAVENOUS | Status: AC
  Administered 2020-04-09 – 2020-04-11 (×6): 2 g via INTRAVENOUS
  Filled 2020-04-09 (×6): qty 2

## 2020-04-09 NOTE — Progress Notes (Signed)
Expand All Collapse All     Show:Clear all [x] Manual[x] Template[] Copied  Added by: [x] Kirsteins, Luanna Salk, MD[x] Love, Ivan Anchors, PA-C   [] Hover for details       Physical Medicine and Rehabilitation Consult  Reason for Consult: Functional deficits due to fall with TBI/polytrauma Referring Physician:  Dr.    Harrison Mons: Amanda [name] is a 80 y.o. female with history of lymphoma, macular degeneration, bronchiectasis who fell 10' from her attic on 03/31/20 and had obtundation with right sided weakness.  She was found to have left frontal parietal lobe diffusion abnormality, right C2-C4 transverse process Fx, left 1st/2nd rib fx,  right humerus fracture,  The patient had a drop in hemoglobin to 6.9 and required transfusion of PRBCs, posttransfusion hemoglobin up to 10.5 and down to 9.6 this morning The patient has nausea which is most associated with movement.  No abdominal pain Also has right hip pain Patient lives with her husband who walks with a cane outside the home but without a cane inside the home  Review of Systems  Constitutional: Positive for malaise/fatigue.  HENT: Negative for nosebleeds.   Eyes: Negative for double vision, pain, discharge and redness.  Respiratory: Negative for cough, shortness of breath, wheezing and stridor.   Cardiovascular: Negative for chest pain and leg swelling.  Gastrointestinal: Positive for nausea. Negative for vomiting.  Genitourinary: Negative for flank pain.  Musculoskeletal: Positive for falls and neck pain.  Skin: Negative.   Neurological: Positive for focal weakness. Negative for speech change and headaches.  Endo/Heme/Allergies: Does not bruise/bleed easily.  Psychiatric/Behavioral: Positive for memory loss. The patient is not nervous/anxious.           Past Medical History:  Diagnosis Date  . Bronchiectasis ([place])   . Cervicalgia   . Hypothyroid   . Macular degeneration, bilateral   . 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 Pneumonia due to COVID-19 virus 2021   Required hospitalization  . Urinary, incontinence, stress female         Past Surgical History:  Procedure Laterality Date  . ORIF ANKLE FRACTURE Left 12/2018   No family history on file.   Social History:  reports that she has never smoked. She has never used smokeless tobacco. No history on file for alcohol use and drug use.        Allergies  Allergen Reactions  . Levaquin [Levofloxacin] Nausea And Vomiting  . Pravastatin Nausea And Vomiting  . Preservision Areds 2 [Multiple Vitamins-Minerals] Nausea Only  . Promethazine Other (See Comments)    "Agitation and jerking"          Medications Prior to Admission  Medication Sig Dispense Refill  . acetaminophen (TYLENOL) 650 MG CR tablet Take 650 mg by mouth 2 (two) times daily as needed for pain.    [name] albuterol (PROVENTIL) (2.5 MG/3ML) 0.083% nebulizer solution Take 2.5 mg by nebulization every 6 (six) hours as needed for wheezing or shortness of breath.    [name] amLODipine (NORVASC) 10 MG tablet Take 10 mg by mouth daily.    [name] aspirin EC 81 MG tablet Take 81 mg by mouth daily. Swallow whole.    [name] buPROPion (WELLBUTRIN XL) 150 MG 24 hr tablet Take 150 mg by mouth daily.    . famotidine (PEPCID) 20 MG tablet Take 20 mg by mouth daily as needed for heartburn or indigestion.    [name] levothyroxine (SYNTHROID) 125 MCG tablet Take 125 mcg by mouth daily before breakfast.    . Multiple Vitamins-Minerals (  MACULAR HEALTH FORMULA) CAPS Take 1 capsule by mouth daily.    . Multiple Vitamins-Minerals (MACULAR VITAMIN BENEFIT) TABS Take 1 tablet by mouth daily with breakfast.    . NON FORMULARY Take 1 tablet by mouth See admin instructions. Calcium/D-3/Magnesium- Take 0.5 tablet by mouth two times a day    . ondansetron (ZOFRAN) 4 MG tablet Take 4 mg by mouth every 8 (eight) hours as needed for nausea or vomiting.    [name] PROVENTIL HFA 108 (90 Base) MCG/ACT inhaler Inhale 1-2  puffs into the lungs every 6 (six) hours as needed for wheezing or shortness of breath.      Home: Home Living Family/patient expects to be discharged to:: Private residence Living Arrangements: Spouse/significant other Available Help at Discharge: Family,Available 24 hours/day (minA available) Type of Home: House Home Access: Stairs to enter [employer] of Steps: 4 Entrance Stairs-Rails: Right,Left,Can reach both [place]: One level Bathroom Shower/Tub: Chiropodist: [place]: Environmental consultant - 2 wheels,Wheelchair - manual,Bedside commode,Grab bars - tub/shower  Lives With: Spouse  Functional History: Prior Function Level of Independence: Independent Functional Status:  Mobility: Bed Mobility Overal bed mobility: Needs Assistance Bed Mobility: Supine to Sit Supine to sit: Mod assist,HOB elevated General bed mobility comments: PT provides assist to pivot hips to edge of bed Transfers Overall transfer level: Needs assistance Equipment used: 1 person hand held assist Transfers: Sit to/from [place] to Stand: Mod assist Stand pivot transfers: Mod assist  ADL:  Cognition: Cognition Overall Cognitive Status: Impaired/Different from baseline Arousal/Alertness: Awake/alert Orientation Level: Oriented to person,Oriented to place,Oriented to time,Oriented to situation Attention: Focused,Sustained,Selective Focused Attention: Appears intact Sustained Attention: Appears intact Selective Attention: Appears intact Memory: Impaired Memory Impairment: Retrieval deficit Awareness: Appears intact Awareness Impairment: Intellectual impairment,Emergent impairment Problem Solving: Appears intact Cognition Arousal/Alertness: Awake/alert Behavior During Therapy: WFL for tasks assessed/performed Overall Cognitive Status: Impaired/Different from baseline Area of Impairment: Problem solving Problem Solving: Slow  processing,Requires verbal cues,Requires tactile cues   Blood pressure (!) 153/74, pulse 73, temperature 98 F (36.7 C), temperature source Oral, resp. rate (!) 29, height 5' (1.524 m), weight 70 kg, SpO2 97 %. Physical Exam Vitals and nursing note reviewed.  Constitutional:      Appearance: Normal appearance.  HENT:     Head: Normocephalic.  Eyes:     Extraocular Movements: Extraocular movements intact.     Pupils: Pupils are equal, round, and reactive to light.  Cardiovascular:     Rate and Rhythm: Normal rate and regular rhythm.     Heart sounds: Normal heart sounds.  Pulmonary:     Effort: No respiratory distress.     Breath sounds: No stridor. No wheezing or rales.  Abdominal:     General: Abdomen is flat. Bowel sounds are normal. There is no distension.     Palpations: Abdomen is soft.     Tenderness: There is no abdominal tenderness.  Musculoskeletal:     Cervical back: No rigidity.     Right lower leg: No edema.     Left lower leg: No edema.     Comments: Edema in the right hand and forearm 1+ to 2+ pitting Right upper extremity not ranged at the shoulder or elbow due to humeral fracture.  The patient has no pain with right hand range of motion No pain with right lower extremity range of motion with hip internal extra rotation as well as flexion extension.  No pain with knee or ankle range of motion.  No pain with range of motion in the left upper or left lower limb  Skin:    General: Skin is warm and dry.  Neurological:     Mental Status: She is alert. She is disoriented.     Comments: Motor strength could not be tested fully in the right upper extremity due to right humeral fracture she has 4 -/5 in the finger flexors and extensors as well as wrist flexors and extensors Right lower extremity trace hip flexion 3 - knee extension to minus ankle dorsiflexion plantarflexion Patient indicates that light touch is equal in bilateral upper and lower limbs  Psychiatric:         Mood and Affect: Mood normal.        Behavior: Behavior normal.   The patient is oriented to person and place, month but not day or date  Lab Results Last 24 Hours  Results for orders placed or performed during the hospital encounter of 03/31/20 (from the past 24 hour(s))  Expectorated sputum assessment w rflx to resp cult     Status: None   Collection Time: 04/04/20  6:41 PM   Specimen: Expectorated Sputum  Result Value Ref Range   Specimen Description EXPECTORATED SPUTUM    Special Requests NONE    Sputum evaluation      THIS SPECIMEN IS ACCEPTABLE FOR SPUTUM CULTURE Performed at [facility], [address] [place], [place] [address]    Report Status 04/05/2020 FINAL   Culture, respiratory     Status: None (Preliminary result)   Collection Time: 04/04/20  6:41 PM  Result Value Ref Range   Specimen Description EXPECTORATED SPUTUM    Special Requests NONE Reflexed from [id number]    Gram Stain      ABUNDANT WBC PRESENT, PREDOMINANTLY PMN FEW GRAM POSITIVE RODS FEW YEAST RARE GRAM POSITIVE COCCI Performed at [facility], [place] [address], [place],  [address]    Culture PENDING    Report Status PENDING   Hemoglobin and hematocrit, blood     Status: Abnormal   Collection Time: 04/04/20  7:13 PM  Result Value Ref Range   Hemoglobin 10.5 (L) 12.0 - 15.0 g/dL   HCT 30.5 (L) 36.0 - [id number] %  Basic metabolic panel     Status: Abnormal   Collection Time: 04/05/20  6:19 AM  Result Value Ref Range   Sodium 142 135 - 145 mmol/L   Potassium 3.4 (L) 3.5 - 5.1 mmol/L   Chloride 111 98 - 111 mmol/L   CO2 19 (L) 22 - 32 mmol/L   Glucose, Bld 120 (H) 70 - 99 mg/dL   BUN 13 8 - 23 mg/dL   Creatinine, Ser 0.67 0.44 - 1.00 mg/dL   Calcium 7.9 (L) 8.9 - 10.3 mg/dL   GFR, Estimated >60 >60 mL/min   Anion gap 12 5 - 15  CBC     Status: Abnormal   Collection Time: 04/05/20  6:19 AM  Result Value Ref Range   WBC 12.5 (H) 4.0 -  10.5 K/uL   RBC 3.37 (L) 3.87 - 5.11 MIL/uL   Hemoglobin 9.6 (L) 12.0 - 15.0 g/dL   HCT 27.0 (L) 36.0 - 46.0 %   MCV 80.1 80.0 - 100.0 fL   MCH 28.5 26.0 - 34.0 pg   MCHC 35.6 30.0 - 36.0 g/dL   RDW 15.3 11.5 - 15.5 %   Platelets 188 150 - 400 K/uL   nRBC 0.2 0.0 - 0.2 %  Imaging Results (Last 48 hours)  DG Chest Port 1 View  Result Date: 04/04/2020 CLINICAL DATA:  Respiratory failure.  Recent fall. EXAM: PORTABLE CHEST 1 VIEW COMPARISON:  Chest radiograph 03/31/2020 and CT 03/31/2020 FINDINGS: A right jugular Port-A-Cath terminates over the mid SVC. Endotracheal tube has been removed. The cardiomediastinal silhouette is unchanged. There are greatly increased airspace opacities in the left mid and lower lung including dense retrocardiac consolidation, and there is also mild right basilar airspace opacity. There is a small left pleural effusion. No pneumothorax is identified. Left rib and right humerus fractures are again noted. IMPRESSION: 1. Interval extubation. 2. Worsening airspace opacities in the left mid lung and left greater than right lung bases which could reflect a combination of contusion, pneumonia, and atelectasis with a small left pleural effusion. Electronically Signed   By: Logan Bores M.D.   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 Does the need for close, 24 hr/day medical supervision in concert with the patient's rehab needs make it unreasonable for this patient to be served in a less intensive setting? Yes 2. Co-Morbidities requiring supervision/potential complications: Right humeral fracture, left rib fractures #1 and 2, C2-C3-C4 transverse process fracture, acute blood loss anemia 3. Due to bladder management, bowel management, safety, skin/wound care, disease management, medication administration, pain management and patient education, does the patient require 24  hr/day rehab nursing? Yes 4. Does the patient require coordinated care of a physician, rehab nurse, therapy disciplines of PT, OT, speech to address physical and functional deficits in the context of the above medical diagnosis(es)? Yes Addressing deficits in the following areas: balance, endurance, locomotion, strength, transferring, bowel/bladder control, bathing, dressing, feeding, grooming, toileting, cognition and psychosocial support 5. Can the patient actively participate in an intensive therapy program of at least 3 hrs of therapy per day at least 5 days per week? Should be able to tolerate this in the next day or 2 6. The potential for patient to make measurable gains while on inpatient rehab is good 7. Anticipated functional outcomes upon discharge from inpatient rehab are supervision  with PT, supervision with OT, supervision with SLP. 8. Estimated rehab length of stay to reach the above functional goals is: 14 to 16 days 9. Anticipated discharge destination: Home 10. Overall Rehab/Functional Prognosis: good  RECOMMENDATIONS: This patient's condition is appropriate for continued rehabilitative care in the following setting: CIR Patient has agreed to participate in recommended program. Yes Note that insurance prior authorization may be required for reimbursement for recommended care.  Comment: We will need to make sure that hemoglobin is stable prior to rehab.  Also will need to control nausea which has impeded movement.   Bary Leriche, PA-C

## 2020-04-09 NOTE — Plan of Care (Signed)
  Problem: Consults Goal: RH BRAIN INJURY PATIENT EDUCATION Description: Description: See Patient Education module for eduction specifics Outcome: Progressing   Problem: RH SKIN INTEGRITY Goal: RH STG SKIN FREE OF INFECTION/BREAKDOWN Description: Mod I  Outcome: Progressing Goal: RH STG MAINTAIN SKIN INTEGRITY WITH ASSISTANCE Description: STG Maintain Skin Integrity With Mod I Assistance. Outcome: Progressing   Problem: RH SAFETY Goal: RH STG ADHERE TO SAFETY PRECAUTIONS W/ASSISTANCE/DEVICE Description: STG Adhere to Safety Precautions With Mod I Assistance/Device. Outcome: Progressing Goal: RH STG DECREASED RISK OF FALL WITH ASSISTANCE Description: STG Decreased Risk of Fall With Mod I Assistance. Outcome: Progressing   Problem: RH COGNITION-NURSING Goal: RH STG USES MEMORY AIDS/STRATEGIES W/ASSIST TO PROBLEM SOLVE Description: STG Uses Memory Aids/Strategies With Mod I Assistance to Problem Solve. Outcome: Progressing   Problem: RH PAIN MANAGEMENT Goal: RH STG PAIN MANAGED AT OR BELOW PT'S PAIN GOAL Description: No pain Outcome: Progressing   Problem: RH KNOWLEDGE DEFICIT BRAIN INJURY Goal: RH STG INCREASE KNOWLEDGE OF SELF CARE AFTER BRAIN INJURY Description: Mod I  Outcome: Progressing   

## 2020-04-09 NOTE — Progress Notes (Signed)
[facility] PHYSICAL MEDICINE & REHABILITATION PROGRESS NOTE  Subjective/Complaints: Patient seen sitting up in bed this morning. She states slept well overnight. She is ready to begin therapies.  ROS: Denies CP, SOB, N/V/D  Objective: Vital Signs: Blood pressure (!) 161/81, pulse 81, temperature 97.9 F (36.6 C), resp. rate 16, height 5' (1.524 m), weight 69.5 kg, SpO2 93 %. No results found. Recent Labs    04/07/20 0201  WBC 9.7  HGB 9.1*  HCT 27.6*  PLT 267   No results for input(s): NA, K, CL, CO2, GLUCOSE, BUN, CREATININE, CALCIUM in the last 72 hours. No intake or output data in the 24 hours ending 04/09/20 0936      Physical Exam: BP (!) 161/81 (BP Location: Left Arm)   Pulse 81   Temp 97.9 F (36.6 C)   Resp 16   Ht 5' (1.524 m)   Wt 69.5 kg   SpO2 93%   BMI 29.92 kg/m  Constitutional: No distress . Vital signs reviewed. HENT: Normocephalic.  Atraumatic. Eyes: EOMI. No discharge. Cardiovascular: No JVD.  RRR. Respiratory: Normal effort.  No stridor.  Bilateral clear to auscultation. + [place]. GI: Non-distended.  BS +. Skin: Warm and dry. Right hip hematoma Psych: Normal mood.  Normal behavior. Musc: Right shoulder and hip with edema and tenderness. Neuro: Alert Motor: Right upper extremity proximally limited by sling, handgrip 3/5, unchanged Left upper extremity: 5/5 proximal distal Right lower extremity: 4 -/5 proximal to distal, unchanged Left lower extremity: 4+/5 proximal distal   Assessment/Plan: 1. Functional deficits which require 3+ hours per day of interdisciplinary therapy in a comprehensive inpatient rehab setting.  Physiatrist is providing close team supervision and 24 hour management of active medical problems listed below.  Physiatrist and rehab team continue to assess barriers to discharge/monitor patient progress toward functional and medical goals   Care Tool:  Bathing              Bathing assist       Upper Body  Dressing/Undressing Upper body dressing        Upper body assist      Lower Body Dressing/Undressing Lower body dressing            Lower body assist       Toileting Toileting    Toileting assist       Transfers Chair/bed transfer  Transfers assist           Locomotion Ambulation   Ambulation assist              Walk 10 feet activity   Assist           Walk 50 feet activity   Assist           Walk 150 feet activity   Assist           Walk 10 feet on uneven surface  activity   Assist           Wheelchair     Assist               Wheelchair 50 feet with 2 turns activity    Assist            Wheelchair 150 feet activity     Assist           Medical Problem List and Plan: 1.  Deficits with mobility, transfers, ADLs secondary to polytrauma with TBI.  Begin CIR evaluations 2.  DVT Prophylaxis/Anticoagulation: Mechanical: Sequential compression  devices, below knee Bilateral lower extremities Pharmaceutical: Lovenox.              Antiplatelets: Aspirin 81 3.  Acute on chronic pain Management:              Tylenol as needed             Robaxin 1000 mg every 8 scheduled  Monitor with increased exertion 4. Mood: Team support             Antipsychotic: Wellbutrin 5. Neuropsych: This patient is?  Fully capable of making decisions on her own behalf. 6. Skin/Wound Care: Routine skin care 7. Fluids/Electrolytes/Nutrition: Monitor I/Os.             CMP ordered for tomorrow 8.  Right proximal humeral fracture: Nonweightbearing per Ortho and sling. 9.  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  CTA of head/neck no arterial injury. 10.  C2-4 transverse process fracture: C-collar not required per neurosurgery 11.  Essential hypertension             Norvasc 5mg  daily  Monitor with increased mobility 12.  Marginal zone lymphoma             Follow-up as outpatient with  Dr. Jonette Eva 13.  Acute hypoxic respiratory failure: Tracheobronchitis respiratory cultures with Pseudomonas.               IV cefepime 2/4-2/8, discussed antibiotics with pharmacy 14.  Constipation             Colace 100 mg twice daily             Increase bowel meds as necessary 15.  Hypothyroidism: Synthroid 125 daily 16.  GERD: Protonix 20 mg daily  LOS: 1 days A FACE TO FACE EVALUATION WAS PERFORMED  Amanda [name] 04/09/2020, 9:36 AM

## 2020-04-09 NOTE — Evaluation (Signed)
Physical Therapy Assessment and Plan  Patient Details  Name: Amanda [name] MRN: [id number] Date of Birth: 05/26/1940  PT Diagnosis: Abnormal posture, Abnormality of gait, Difficulty walking, Dizziness and giddiness, Edema, Muscle weakness, Pain in ribs, R arm, R hip and Vertigo Rehab Potential: Excellent ELOS: 10-14 days   Today's Date: 04/09/2020 PT Individual Time: 0805-0855 PT Individual Time Calculation (min): 50 min    Hospital Problem: Principal Problem:   Multiple trauma Active Problems:   TBI (traumatic brain injury) ([place])   Chronic pain syndrome   Essential hypertension   Tracheobronchitis   Past Medical History:  Past Medical History:  Diagnosis Date  . Anemia   . Arthritis   . Bronchiectasis ([place])   . Cancer ([place])   . Cervicalgia   . Constipation, chronic   . Essential hypertension   . GERD (gastroesophageal reflux disease)    zantac  . Heart murmur   . History of blood transfusion [place]  . Hyperlipidemia   . Hypertension   . Hyperthyroidism   . Hypothyroid   . Hypothyroidism   . Lumbar burst fracture ([place])   . Lymphoproliferative disorder ([place])   . Macular degeneration 2013   Both eyes   . Macular degeneration, bilateral   . 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 Osteopenia   . Pneumonia   . Pneumonia due to COVID-19 virus 2021   Required hospitalization  . PONV (postoperative nausea and vomiting)    needs little anesthesia  . Shingles   . Shortness of breath    on exertion  . Spleen enlarged   . SUI (stress urinary incontinence, female)   . Urinary, incontinence, stress female   . Wears glasses    Past Surgical History:  Past Surgical History:  Procedure Laterality Date  . BREAST EXCISIONAL BIOPSY Left 1980  . CARPAL TUNNEL RELEASE  1999  . CATARACT EXTRACTION  2009, 2011   BOTH EYES  . CATARACT EXTRACTION, BILATERAL    . [place]  . CESAREAN SECTION    . COLONOSCOPY      Dr Cristina Gong  . DILATION AND CURETTAGE OF  UTERUS     X2  . HYSTEROSCOPY WITH D & C  01/07/2012   Procedure: DILATATION AND CURETTAGE /HYSTEROSCOPY;  Surgeon: Terrance Mass, MD;  Location: [facility];  Service: Gynecology;  Laterality: N/A;  intrauterine foley catheter for tamponode   . IR IMAGING GUIDED PORT INSERTION  07/15/2018  . LYMPH NODE BIOPSY Left 05/26/2018   Procedure: LEFT AXILLARY LYMPH NODE BIOPSY;  Surgeon: Fanny Skates, MD;  Location: [facility];  Service: General;  Laterality: Left;  . ORIF ANKLE FRACTURE Left 12/12/2018  . ORIF ANKLE FRACTURE Left 12/12/2018   Procedure: OPEN REDUCTION INTERNAL FIXATION (ORIF) ANKLE FRACTURE;  Surgeon: Meredith Pel, MD;  Location: [facility];  Service: Orthopedics;  Laterality: Left;  . ORIF ANKLE FRACTURE Left 12/2018  . TONSILLECTOMY    . TONSILLECTOMY AND ADENOIDECTOMY    . TUBAL LIGATION     BY LAPAROSCOPY  . WISDOM TOOTH EXTRACTION      Assessment & Plan Clinical Impression: Patient is a 80 y.o. year old female with past medical history of lymphoma, macular degeneration, bronchiectasis, hypertension, hypothyroidism, GERD presented on 03/31/2020 after a fall from her attic.  History taken from chart review and patient.  She fell approximately 10 feet and was noted to have right-sided weakness with AMS.  Her GCS was 7.  Work-up revealed left frontoparietal  lobe diffusion abnormality, right C2-C4 transverse process fracture, left first and second rib fractures, right humerus fracture.  Hospital course further complicated by drop in hemoglobin to 6.9, which required transfusion of PRBCs.  Orthopedic surgery was consulted and recommended nonoperative management for right proximal humeral fracture with patient nonweightbearing and sling.  Right hemiparesis was thought to be secondary to motor strip injury per neurosurgery.  No need for cervical collar for cervical fracture per neurosurgery.  She did require to be seen, but was later extubated.  She was started on cefepime for Pseudomonas  tracheobronchitis.  Hospital course further complicated by acute blood loss anemia with hemoglobin 9.1 on 04/07/2020.  Patient with associated pain and supplemental oxygen requirement.  She was admitted to inpatient rehab due to weakness, weightbearing precautions, cognitive deficits.  Please see preadmission assessment as well. Patient transferred to CIR on 04/08/2020 .   Patient currently requires min with mobility secondary to muscle weakness and muscle joint tightness, decreased cardiorespiratoy endurance, decreased coordination, general dizziness (will continue to assess) and decreased sitting balance, decreased standing balance, decreased postural control, decreased balance strategies and difficulty maintaining precautions.  Prior to hospitalization, patient was independent  with mobility and lived with Spouse in a House home.  Home access is 4Stairs to enter.  Patient will benefit from skilled PT intervention to maximize safe functional mobility, minimize fall risk and decrease caregiver burden for planned discharge home with 24 hour supervision.  Anticipate patient will benefit from follow up [place] at discharge.  PT - End of Session Activity Tolerance: Tolerates 10 - 20 min activity with multiple rests Endurance Deficit: Yes PT Assessment PT Barriers to Discharge: Inaccessible home environment;Lack of/limited family support;Decreased caregiver support PT Patient demonstrates impairments in the following area(s): Balance;Behavior;Edema;Endurance;Motor;Safety;Perception;Pain;Nutrition;Skin Integrity PT Transfers Functional Problem(s): Bed Mobility;Bed to Chair;Car;Furniture PT Locomotion Functional Problem(s): Ambulation;Wheelchair Mobility;Stairs PT Plan PT Intensity: Minimum of 1-2 x/day ,45 to 90 minutes PT Frequency: 5 out of 7 days PT Duration Estimated Length of Stay: 10-14 days PT Treatment/Interventions: Ambulation/gait training;Cognitive remediation/compensation;Discharge  planning;DME/adaptive equipment instruction;Functional mobility training;Pain management;Psychosocial support;Splinting/orthotics;Therapeutic Activities;UE/LE Strength taining/ROM;Wheelchair propulsion/positioning;UE/LE Coordination activities;Therapeutic Exercise;Stair training;Skin care/wound management;Patient/family education;Neuromuscular re-education;Functional electrical stimulation;Disease management/prevention;Community reintegration;Balance/vestibular training PT Transfers Anticipated Outcome(s): supervision using LRAD PT Locomotion Anticipated Outcome(s): CGA using LRAD 100 ft PT Recommendation Recommendations for Other Services: Neuropsych consult;Therapeutic Recreation consult Therapeutic Recreation Interventions: Stress management Follow Up Recommendations: Home health PT Patient destination: Home Equipment Recommended: To be determined Equipment Details: patient has RW, w/c , and TTB, will determine further needs based on patient progress   PT Evaluation Precautions/Restrictions Precautions Precautions: Fall Precaution Comments: NWB RUE- no shoulder ROM, ok wrist and hand. Sling OOB Required Braces or Orthoses: Sling Restrictions Weight Bearing Restrictions: Yes RUE Weight Bearing: Non weight bearing Home Living/Prior Functioning Home Living Available Help at Discharge: Family;Available 24 hours/day Type of Home: House Home Access: Stairs to enter [employer] of Steps: 4 Entrance Stairs-Rails: Right;Left;Can reach both Home Layout: One level Bathroom Shower/Tub: Chiropodist: Standard Bathroom Accessibility: Yes  Lives With: Spouse Prior Function Level of Independence: Independent with basic ADLs;Independent with gait;Independent with transfers  Able to Take Stairs?: Yes Driving: Yes Vision/Perception  Vision - Assessment Eye Alignment: Within Functional Limits Ocular Range of Motion: Within Functional Limits Alignment/Gaze  Preference: Within Defined Limits Tracking/Visual Pursuits: Able to track stimulus in all quads without difficulty Saccades: Within functional limits Perception Perception: Within Functional Limits Praxis Praxis:  (unable to fully assess due to dizziness and pain on evaluation)  Cognition Overall Cognitive Status: Within Functional Limits for tasks assessed Arousal/Alertness: Awake/alert Orientation Level: Oriented X4 Attention: Selective Focused Attention: Appears intact Sustained Attention: Appears intact Selective Attention: Appears intact Memory: Appears intact Immediate Memory Recall: Sock;Blue;Bed Memory Recall Sock: Without Cue Memory Recall Blue: Without Cue Memory Recall Bed: Without Cue Awareness: Appears intact Problem Solving: Impaired Problem Solving Impairment: Functional complex Safety/Judgment: Appears intact (does need cues for R UE NWB, but adheres immediately following cues) Comments: Able to correctly identify deficits and need to ask for help, no concerns for safety awareness at this time [employer] Scales of Cognitive Functioning: Purposeful/appropriate Sensation Sensation Light Touch: Appears Intact Coordination Gross Motor Movements are Fluid and Coordinated: No Fine Motor Movements are Fluid and Coordinated: No Coordination and Movement Description: R humerus fx and polytrauma with pain limitng mobility, generalized deconditiong Motor  Motor Motor: Abnormal postural alignment and control;Other (comment) Motor - Skilled Clinical Observations: Polytrauma with generalized deconditioning   Trunk/Postural Assessment  Cervical Assessment Cervical Assessment: Exceptions to [facility] (forward head) Thoracic Assessment Thoracic Assessment: Exceptions to [facility] (mild kyphosis) Lumbar Assessment Lumbar Assessment: Exceptions to [facility] (posterior pelvic tilt) Postural Control Postural Control: Deficits on evaluation (decreased/delayed)  Balance Balance Balance  Assessed: Yes Static Sitting Balance Static Sitting - Balance Support: No upper extremity supported;Feet supported Static Sitting - Level of Assistance: 5: Stand by assistance Dynamic Sitting Balance Dynamic Sitting - Balance Support: No upper extremity supported;Feet unsupported;During functional activity Dynamic Sitting - Level of Assistance: 5: Stand by assistance Static Standing Balance Static Standing - Balance Support: No upper extremity supported Static Standing - Level of Assistance: 4: Min assist (CGA) Dynamic Standing Balance Dynamic Standing - Balance Support: No upper extremity supported Dynamic Standing - Level of Assistance: 4: Min assist Extremity Assessment  RLE Assessment RLE Assessment: Exceptions to [facility] Active Range of Motion (AROM) Comments: limited hip flexion in sitting due to pain General Strength Comments: grossly at least 3+/5 limited by R hip/thigh pain LLE Assessment LLE Assessment: Exceptions to [facility] Active Range of Motion (AROM) Comments: WFL for functional mobility General Strength Comments: grossly at least 4+/5  Care Tool Care Tool Bed Mobility Roll left and right activity   Roll left and right assist level: Minimal Assistance - Patient > 75%    Sit to lying activity   Sit to lying assist level: Minimal Assistance - Patient > 75%    Lying to sitting edge of bed activity   Lying to sitting edge of bed assist level: Minimal Assistance - Patient > 75%     Care Tool Transfers Sit to stand transfer   Sit to stand assist level: Minimal Assistance - Patient > 75%    Chair/bed transfer Chair/bed transfer activity did not occur: Safety/medical concerns (dizziness in standing limiting safety with mobility)       Psychologist, counselling transfer activity did not occur: Safety/medical concerns        Care Tool Locomotion Ambulation Ambulation activity did not occur: Safety/medical concerns        Walk 10 feet activity Walk 10  feet activity did not occur: Safety/medical concerns       Walk 50 feet with 2 turns activity Walk 50 feet with 2 turns activity did not occur: Safety/medical concerns      Walk 150 feet activity Walk 150 feet activity did not occur: Safety/medical concerns      Walk 10 feet on uneven surfaces activity Walk 10 feet  on uneven surfaces activity did not occur: Safety/medical concerns      Stairs Stair activity did not occur: Safety/medical concerns        Walk up/down 1 step activity Walk up/down 1 step or curb (drop down) activity did not occur: Safety/medical concerns     Walk up/down 4 steps activity did not occuR: Safety/medical concerns  Walk up/down 4 steps activity      Walk up/down 12 steps activity Walk up/down 12 steps activity did not occur: Safety/medical concerns      Pick up small objects from floor Pick up small object from the floor (from standing position) activity did not occur: Safety/medical concerns      Wheelchair     Wheelchair activity did not occur: Safety/medical concerns (unable to transfer to w/c due to dizziness)      Wheel 50 feet with 2 turns activity Wheelchair 50 feet with 2 turns activity did not occur: Safety/medical concerns    Wheel 150 feet activity Wheelchair 150 feet activity did not occur: Safety/medical concerns      Refer to Care Plan for Long Term Goals  SHORT TERM GOAL WEEK 1 PT Short Term Goal 1 (Week 1): Patient will performe bed mobility with CGA while maintain R UE NWB without cues. PT Short Term Goal 2 (Week 1): Patient will perform basic transfers with CGA for safety/balance using LRAD. PT Short Term Goal 3 (Week 1): Patient will ambulate >50 ft with CGA for safety/balance. PT Short Term Goal 4 (Week 1): Patient will initiate stair training.  Recommendations for other services: Neuropsych and Therapeutic Recreation  Stress management  Skilled Therapeutic Intervention In addition to the PT evaluation above, the patient  performed the following skilled PT interventions: Patient in bed upon PT arrival. Patient alert and agreeable to PT session. Patient reported 3-6/10 rib, R arm, and R hip pain during session, RN made aware. PT provided repositioning, rest breaks, and distraction as pain interventions throughout session.   Vitals: Supine: BP: 175/81, HR 83, SPO2 93% on RA, RN made aware of BP and cleared patient for mobility monitoring for symptoms Sitting: BP 160/79, HR 88, SPO2 95% (mild dizziness that resolved) Standing: BP 138/81, HR 90, SPO2 96% (significant dizziness) RN made aware of vitals and symptomatic orthostasis at end of session.  Therapeutic Activity: Bed Mobility: Patient performed rolling R/L with min A in a flat bed without use of bed rails. She performed supine to/from sit with min A for trunk management rolling to the L and using her L arm to push up. Provided verbal cues for maintaining R upper extremity NWB x2. All bed mobility required increased time due to pain. Patient reported mild dizziness in sitting that resolved <1 min.  Transfers: Patient performed sit to/from stand x2 with min A for balance. Patient reported significant dizziness in standing that would not resolve requiring her to sit back down both trials. Maintained standing ~1 min each trial. Donned panties on second trial with max A, assisted with pulling them over her hips on the L side in standing.   Educated patient on orthostatic hypotension causes and symptoms. Patient discouraged by her lack of progress with mobility during evaluation. Encouraged her to attempt mobility again with OT following administration of medication in hopes for improved performance. Provided education on TBI symptoms and management as well as PT goals for improving patient's activity tolerance.   Instructed pt in results of PT evaluation as detailed above, PT POC, rehab potential, rehab goals, and  discharge recommendations. Additionally discussed CIR's  policies regarding fall safety and use of chair alarm and/or quick release belt. Pt verbalized understanding and in agreement. Will update pt's family members as they become available.   Patient in bed with RN in the room at end of session with breaks locked, bed alarm set, and all needs within reach. Patient missed 10 min of skilled PT due to fatigue, RN made aware. Will attempt to make-up missed time as able.    Discharge Criteria: Patient will be discharged from PT if patient refuses treatment 3 consecutive times without medical reason, if treatment goals not met, if there is a change in medical status, if patient makes no progress towards goals or if patient is discharged from hospital.  The above assessment, treatment plan, treatment alternatives and goals were discussed and mutually agreed upon: by patient  Doreene Burke PT, DPT  04/09/2020, 12:34 PM
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
## 2020-04-09 NOTE — Progress Notes (Signed)
PMR Admission Coordinator Pre-Admission Assessment  Patient: Amanda [name] is an 80 y.o., female MRN: [id number] DOB: 1940-10-16 Height: 5' (152.4 cm) Weight: 70 kg                                                                                                                                                  Insurance Information HMO:     PPO:      PCP:      IPA:      80/20:      OTHER:  PRIMARY: Medicare A and B      Policy#: [id number]      Subscriber: pt CM Name:       Phone#:      Fax#:  Pre-Cert#: verified online      Employer:  Benefits:  Phone #:      Name:  Eff. Date: A and B 12/03/2003     Deduct: $1556      Out of Pocket Max: n/a      Life Max: n/a  CIR: 100%      SNF: 20 full days  Outpatient: 80%     Co-Ins: 20% Home Health: 100%      Co-Pay:  DME: 80%     Co-Ins: 20% Providers: pt choice SECONDARY: [employer]      Policy#: [id number]      Phone#: [phone]  Financial Counselor:       Phone#:   The "Data Collection Information Summary" for patients in Inpatient Rehabilitation Facilities with attached "Privacy Act [place] Records" was provided and verbally reviewed with: Patient and Family  Emergency Contact Information Contact Information     Name Relation Home Work Mobile   [name],[name] Spouse   [phone]   [name],[name] Daughter   [phone]   [name],[name] Daughter   [phone]      Current Medical History  Patient Admitting Diagnosis: TBI with polytrauma  History of Present Illness: Amanda Eden Wymeris an 79yofemale who [facility] as a level 1 trauma after falling about 10 feet from her attic.This was heard by her husband. On arrival patients GCS 7 and she was not moving her right side, intubated in the ED for airway protection. She was noted to have a deformity of the right sideof her chest wall at her shoulder with a large bruise. Patient was intubated in the trauma baydue to depressed GCS.Pelvic and CXR  unremarkable besides proximal right humerus fx. Patient was taken to the CT scanner. Work up showedRight proximal humerus fracture,Right posterior thigh contusion,Acute hypoxic respiratory failure,Righthemiparesis, andC2-4 TVP Fracture. Orthopedics was consulted for humeral fracture and recommended conservative management, NWB in sling.  Neurosurgery consulted for hemiparesis and Cspine fractures and recommended non operative management without bracing.  Pt was successfully extubated on 1/30 and is currently on room air.  Possible aspiration on 2/1 and pt  started on cefepime.  Therapy evaluations were completed and patient was recommended for CIR.  Glasgow Coma Scale Score: 15  Past Medical History      Past Medical History:  Diagnosis Date  . Bronchiectasis ([place])   . Cervicalgia   . Hypothyroid   . Lumbar burst fracture ([place])   . Macular degeneration, bilateral   . 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 Pneumonia due to COVID-19 virus 2021   Required hospitalization  . 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  Prior Rehab/Hospitalizations:  Has the patient had prior rehab or hospitalizations prior to admission? Yes  Has the patient had major surgery during 100 days prior to admission? No  Current Medications   Current Facility-Administered Medications:  .  0.9 %  sodium chloride infusion (Manually program via Guardrails IV Fluids), , Intravenous, Once, Georganna Skeans, MD .  0.9 %  sodium chloride infusion, , Intravenous, PRN, Jesusita Oka, MD, Stopped at 04/04/20 1445 .  acetaminophen (TYLENOL) tablet 1,000 mg, 1,000 mg, Oral, Q6H, Jesusita Oka, MD, 1,000 mg at 04/08/20 0546 .  albuterol (PROVENTIL) (2.5 MG/3ML) 0.083% nebulizer solution 2.5 mg, 2.5 mg, Nebulization, Q6H PRN, Romana Juniper A, MD, 2.5 mg at 04/05/20 0400 .  busPIRone (BUSPAR) tablet 7.5 mg, 7.5 mg, Oral, TID PRN, Jesusita Oka, MD, 7.5 mg at  04/08/20 0434 .  ceFEPIme (MAXIPIME) 2 g in sodium chloride 0.9 % 100 mL IVPB, 2 g, Intravenous, Q12H, Norm Parcel, PA-C, Last Rate: 200 mL/hr at 04/08/20 1034, 2 g at 04/08/20 1034 .  Chlorhexidine Gluconate Cloth 2 % PADS 6 each, 6 each, Topical, Daily, Jesusita Oka, MD, 6 each at 04/08/20 1034 .  docusate sodium (COLACE) capsule 100 mg, 100 mg, Oral, BID, Jesusita Oka, MD, 100 mg at 04/07/20 1110 .  enoxaparin (LOVENOX) injection 30 mg, 30 mg, Subcutaneous, Q12H, Georganna Skeans, MD, 30 mg at 04/08/20 1030 .  hydrALAZINE (APRESOLINE) injection 10 mg, 10 mg, Intravenous, Q2H PRN, Jillyn Ledger, PA-C, 10 mg at 04/08/20 0345 .  methocarbamol (ROBAXIN) tablet 1,000 mg, 1,000 mg, Oral, Q8H, Lovick, Montel Culver, MD, 1,000 mg at 04/08/20 0546 .  metoprolol tartrate (LOPRESSOR) injection 5 mg, 5 mg, Intravenous, Q6H PRN, Jillyn Ledger, PA-C, 5 mg at 04/04/20 [id number] .  ondansetron (ZOFRAN-ODT) disintegrating tablet 4 mg, 4 mg, Oral, Q6H PRN, 4 mg at 04/08/20 0734 **OR** ondansetron (ZOFRAN) injection 4 mg, 4 mg, Intravenous, Q6H PRN, Jillyn Ledger, PA-C, 4 mg at 04/05/20 1805 .  oxyCODONE (Oxy IR/ROXICODONE) immediate release tablet 2.5-5 mg, 2.5-5 mg, Oral, Q6H PRN, Georganna Skeans, MD, 5 mg at 04/08/20 1028 .  polyethylene glycol (MIRALAX / GLYCOLAX) packet 17 g, 17 g, Oral, Daily, Jesusita Oka, MD, 17 g at 04/07/20 1113 .  prochlorperazine (COMPAZINE) injection 5 mg, 5 mg, Intravenous, Q6H PRN, Stechschulte, Nickola Major, MD  Patients Current Diet:  Diet Order             Diet - low sodium heart healthy           Diet regular Room service appropriate? Yes; Fluid consistency: Thin  Diet effective now                   Precautions / Restrictions Precautions Precautions: Fall Precaution Comments: collar d/c per chart 2/2 [place]/ lovick notes, okay for wrist and finger movement in relation to ADLs as long shoulder is  immobilized and remains in sling ( per  Dr. Doreatha Martin 2/4) Restrictions Weight Bearing Restrictions: Yes RUE Weight Bearing: Non weight bearing   Has the patient had 2 or more falls or a fall with injury in the past year?Yes  Prior Activity Level Community (5-7x/wk): Fully independent following CIR stay in 2020.  No DME used.  Prior Functional Level Prior Function Level of Independence: Independent  Self Care: Did the patient need help bathing, dressing, using the toilet or eating?  Independent  Indoor Mobility: Did the patient need assistance with walking from room to room (with or without device)? Independent  Stairs: Did the patient need assistance with internal or external stairs (with or without device)? Independent  Functional Cognition: Did the patient need help planning regular tasks such as shopping or remembering to take medications? [place] / Equipment Home Assistive Devices/Equipment: Environmental consultant (specify type),Wheelchair (intermittent use) Home Equipment: Walker - 2 wheels,Wheelchair - manual,Bedside commode,Grab bars - tub/shower  Prior Device Use: Indicate devices/aids used by the patient prior to current illness, exacerbation or injury? None of the above  Current Functional Level Cognition  Arousal/Alertness: Awake/alert Overall Cognitive Status: Within Functional Limits for tasks assessed Orientation Level: Oriented X4 Safety/Judgement: Decreased awareness of safety,Decreased awareness of deficits General Comments: very pleasant, slightly slow to reposnd but feel pt internally distracted by pain Attention: Focused,Sustained,Selective Focused Attention: Appears intact Sustained Attention: Appears intact Selective Attention: Appears intact Memory: Impaired Memory Impairment: Retrieval deficit Awareness: Appears intact Awareness Impairment: Intellectual impairment,Emergent impairment Problem Solving: Appears intact    Extremity Assessment (includes  Sensation/Coordination)  Upper Extremity Assessment: RUE deficits/detail RUE Deficits / Details: RUE sling arom digits elbow and wrist. pt does not activate without cues RUE Coordination: decreased fine motor,decreased gross motor  Lower Extremity Assessment: Defer to PT evaluation    ADLs  Overall ADL's : Needs assistance/impaired Eating/Feeding: Maximal assistance,Sitting Eating/Feeding Details (indicate cue type and reason): decrease grasp on spoon Grooming: Wash/dry face,Oral care,Standing,Min guard,Cueing for compensatory techniques Grooming Details (indicate cue type and reason): cues for compensatory methods in relation to using RUE as stabilizer. min guard for balance during dynamic ADL tasks Upper Body Bathing: Total assistance Lower Body Bathing: Total assistance Upper Body Dressing : Total assistance Lower Body Dressing: Moderate assistance,Sitting/lateral leans Lower Body Dressing Details (indicate cue type and reason): MOD A to don socks from EOB, education on LB AE would be benefical for next session Toilet Transfer: Minimal Patent attorney Details (indicate cue type and reason): simulated via functional mobility in room, MIN A +1 for hand held assist on LUE Functional mobility during ADLs: Minimal assistance General ADL Comments: pt continues to present with decreased activity tolerance, pain, and RUE WB restrictions    Mobility  Overal bed mobility: Needs Assistance Bed Mobility: Supine to Sit,Sit to Supine Supine to sit: Min assist,HOB elevated Sit to supine: Min guard,HOB elevated General bed mobility comments: light MIN A for supine>sitting as pt reaching out for therapist to elevate trunk into sitting, minguard to return to supine for safety    Transfers  Overall transfer level: Needs assistance Equipment used: 1 person hand held assist Transfers: Sit to/from Stand Sit to Stand: Min assist Stand pivot transfers: Mod assist General  transfer comment: light MIN A to rise into standing for initial steadying assist, x2 sit<>stands from EOB and BSC    Ambulation / Gait / Stairs / Wheelchair Mobility  Ambulation/Gait Ambulation/Gait assistance: Mod assist,Min assist Gait Distance (Feet): 25 Feet (x3) Assistive device: 1  person hand held assist Gait Pattern/deviations: Step-to pattern,Decreased stride length,Wide base of support General Gait Details: modA at times due to increased lateral movement. small lateral steps with increased lateral sway requiring min-modA throughout Gait velocity: dcr    Posture / Balance Dynamic Sitting Balance Sitting balance - Comments: reliant on UE support of bed Balance Overall balance assessment: Needs assistance Sitting-balance support: Single extremity supported,Feet supported Sitting balance-Leahy Scale: Fair Sitting balance - Comments: reliant on UE support of bed Standing balance support: During functional activity,No upper extremity supported Standing balance-Leahy Scale: Fair Standing balance comment: pt able to complete ADL tasks at sink with no UE support with no LOB    Special needs/care consideration Behavioral consideration TBI and Designated visitor [place] (from acute therapy documentation) Living Arrangements: Spouse/significant other  Lives With: Spouse Available Help at Discharge: Family,Available 24 hours/day Type of Home: House Home Layout: One level Home Access: Stairs to enter Entrance Stairs-Rails: Right,Left,Can reach both Entrance Stairs-Number of Steps: 4 Bathroom Shower/Tub: Optometrist: Yes Home Care Services: No Additional Comments: information obtain from PT eval  Discharge Living Setting Plans for Discharge Living Setting: Patient's home (can go to daughter's in [place]) Type of Home at Discharge: House Discharge Home Layout: One level Discharge Home  Access: Stairs to enter Entrance Stairs-Rails: Can reach both Entrance Stairs-Number of Steps: 4 Discharge Bathroom Shower/Tub: Tub/shower unit Discharge Bathroom Toilet: Standard Discharge Bathroom Accessibility: Yes How Accessible: Accessible via walker Does the patient have any problems obtaining your medications?: No  Social/Family/Support Systems Patient Roles: Spouse Anticipated Caregiver: [name] (spouse) Anticipated Ambulance person Information: [phone] (stayed with daughter, [name], after last admit [phone]) Ability/Limitations of Caregiver: [name] can provide up to min guard, uses no DME in the home but [facility] outside of the home. Caregiver Availability: 24/7 Discharge Plan Discussed with Primary Caregiver: Yes Is Caregiver In Agreement with Plan?: Yes Does Caregiver/Family have Issues with Lodging/Transportation while Pt is in Rehab?: No   Goals Patient/Family Goal for Rehab: PT/OT/SLP supervision/Min A Expected length of stay: 14-17 days Additional Information: previous CIR admit, Dr. Naaman Plummer team, 12/2018 Pt/Family Agrees to Admission and willing to participate: Yes Program Orientation Provided & Reviewed with Pt/Caregiver Including Roles  & Responsibilities: Yes   Decrease burden of Care through IP rehab admission: n/a  Possible need for SNF placement upon discharge:Not anticipated   Patient Condition: This patient's medical and functional status has changed since the consult dated: 04/05/20 in which the Rehabilitation Physician determined and documented that the patient's condition is appropriate for intensive rehabilitative care in an inpatient rehabilitation facility. See "History of Present Illness" (above) for medical update. Functional changes are: Pt. Min-mod A with transfers, ADLs and ambulation. Patient's medical and functional status update has been discussed with the Rehabilitation physician and patient remains appropriate for inpatient  rehabilitation. Will admit to inpatient rehab on 04/08/2020.  Preadmission Screen Completed By: Shann Medal, PT, DPT with day of admit updates by Genella Mech, CCC-SLP, DPT 04/08/2020 11:42 AM with updates by Clemens Catholic, MS CCC-SLP ______________________________________________________________________   Discussed status with Dr. Posey Pronto on 04/08/20 at 79 and received approval for admission 04/08/20.

## 2020-04-09 NOTE — Progress Notes (Addendum)
Inpatient Rehabilitation Medication Review by a Pharmacist  A complete drug regimen review was completed for this patient to identify any potential clinically significant medication issues.  Clinically significant medication issues were identified:  yes   Type of Medication Issue Identified Description of Issue Urgent (address now) Non-Urgent (address on AM team rounds) Plan Plan Accepted by Provider? (Yes / No / Pending AM Rounds)  Drug Interaction(s) (clinically significant)       Duplicate Therapy       Allergy       No Medication Administration End Date       Incorrect Dose       Additional Drug Therapy Needed  Patient was discharged on Levaquin to complete 5 days of antibiotic therapy for pseudomonas pneumonia. She received two days of cefepime while inpatient, Levaquin should continue through 2/8.   Amlodipine 10 mg was ordered on discharge summary. Receiving amlodipine 5 mg in CIR. BP is elevated.   Zofran 4 mg q8h prn N/V continued on discharge summary, not ordered in CIR.  Urgent Message MD to restart antibiotic therapy (end date 2/8)  Message MD to increase amlodipine to 10 mg as appropriate and resume Zofran if patient has N/V.  Per MD, continue cefepime for remainder of 5 day course. Now ordered.   Other         Name of provider notified for urgent issues identified: Pat[place]  Provider Method of Notification: Secure chat  Pharmacist comments: N/A  Time spent performing this drug regimen review (minutes):  [place], PharmD PGY1 Pharmacy Resident 04/09/2020 7:23 AM  Please check [email or web address] for unit-specific pharmacy phone numbers.

## 2020-04-10 DIAGNOSIS — T07XXXA Unspecified multiple injuries, initial encounter: Secondary | ICD-10-CM | POA: Diagnosis not present

## 2020-04-10 DIAGNOSIS — I10 Essential (primary) hypertension: Secondary | ICD-10-CM | POA: Diagnosis not present

## 2020-04-10 DIAGNOSIS — J209 Acute bronchitis, unspecified: Secondary | ICD-10-CM | POA: Diagnosis not present

## 2020-04-10 DIAGNOSIS — E876 Hypokalemia: Secondary | ICD-10-CM

## 2020-04-10 DIAGNOSIS — S069X1S Unspecified intracranial injury with loss of consciousness of 30 minutes or less, sequela: Secondary | ICD-10-CM | POA: Diagnosis not present

## 2020-04-10 LAB — COMPREHENSIVE METABOLIC PANEL
ALT: 18 U/L (ref 0–44)
AST: 20 U/L (ref 15–41)
Albumin: 2.4 g/dL — ABNORMAL LOW (ref 3.5–5.0)
Alkaline Phosphatase: 105 U/L (ref 38–126)
Anion gap: 12 (ref 5–15)
BUN: 8 mg/dL (ref 8–23)
CO2: 27 mmol/L (ref 22–32)
Calcium: 8.2 mg/dL — ABNORMAL LOW (ref 8.9–10.3)
Chloride: 101 mmol/L (ref 98–111)
Creatinine, Ser: 0.59 mg/dL (ref 0.44–1.00)
GFR, Estimated: >60 mL/min (ref >60)
Glucose, Bld: 93 mg/dL (ref 70–99)
Potassium: 3.1 mmol/L — ABNORMAL LOW (ref 3.5–5.1)
Sodium: 140 mmol/L (ref 135–145)
Total Bilirubin: 1 mg/dL (ref 0.3–1.2)
Total Protein: 4.9 g/dL — ABNORMAL LOW (ref 6.5–8.1)

## 2020-04-10 LAB — CBC WITH DIFFERENTIAL/PLATELET
Abs Immature Granulocytes: 0.41 10*3/uL — ABNORMAL HIGH (ref 0.00–0.07)
Basophils Absolute: 0.1 10*3/uL (ref 0.0–0.1)
Basophils Relative: 1 %
Eosinophils Absolute: 0.2 10*3/uL (ref 0.0–0.5)
Eosinophils Relative: 3 %
HCT: 31.4 % — ABNORMAL LOW (ref 36.0–46.0)
Hemoglobin: 10.7 g/dL — ABNORMAL LOW (ref 12.0–15.0)
Immature Granulocytes: 5 %
Lymphocytes Relative: 11 %
Lymphs Abs: 0.8 10*3/uL (ref 0.7–4.0)
MCH: 28.1 pg (ref 26.0–34.0)
MCHC: 34.1 g/dL (ref 30.0–36.0)
MCV: 82.4 fL (ref 80.0–100.0)
Monocytes Absolute: 0.7 10*3/uL (ref 0.1–1.0)
Monocytes Relative: 9 %
Neutro Abs: 5.5 10*3/uL (ref 1.7–7.7)
Neutrophils Relative %: 71 %
Platelets: 417 10*3/uL — ABNORMAL HIGH (ref 150–400)
RBC: 3.81 MIL/uL — ABNORMAL LOW (ref 3.87–5.11)
RDW: 17 % — ABNORMAL HIGH (ref 11.5–15.5)
WBC: 7.7 10*3/uL (ref 4.0–10.5)
nRBC: 0.3 % — ABNORMAL HIGH (ref 0.0–0.2)

## 2020-04-10 MED ORDER — POTASSIUM CHLORIDE CRYS ER 20 MEQ PO TBCR
20.0000 meq | EXTENDED_RELEASE_TABLET | Freq: Every day | ORAL | Status: DC
  Administered 2020-04-10 – 2020-04-20 (×11): 20 meq via ORAL
  Filled 2020-04-10 (×11): qty 1

## 2020-04-10 MED ORDER — ENSURE MAX PROTEIN PO LIQD
11.0000 [oz_av] | Freq: Every day | ORAL | Status: DC
  Administered 2020-04-10 – 2020-04-17 (×8): 11 [oz_av] via ORAL

## 2020-04-10 NOTE — Progress Notes (Signed)
Occupational Therapy Session Note  Patient Details  Name: Amanda [name] MRN: [id number] Date of Birth: May 17, 1940  Today's Date: 04/10/2020 OT Individual Time: 1000-1100 OT Individual Time Calculation (min): 60 min   Short Term Goals: Week 1:  OT Short Term Goal 1 (Week 1): Pt will don UB clothing with min A maintaining shoulder precautions OT Short Term Goal 2 (Week 1): Pt will don pants with min A with LRAD OT Short Term Goal 3 (Week 1): Pt will complete BSC transfer with CGA  Skilled Therapeutic Interventions/Progress Updates:    Pt greeted semi-reclined in bed and agreeable to OT treatment session. Pt wanted to shower today. Nursing enetred to take pt off of IV for shower. Pt aesymptomatic of orthostasis throughout session. Pt completed bed mobility with mod A. She then stood with min A and ambulated with min/mod HHA to bathroom to transition onto tub bench in shower. Pt unable to reach very far with R UE 2/2 humerus fx. OT assist to was L side of body. Min A for balance while standing to wash peri-area and OT assist to wash buttocks. Stand-pivot out of shower with min A. Dressing completed sit<>stand from wc with mod A for UB 2/2 painful R UE and max A for LB dressing. Pt ambulated 5 feet back to bed at end of session with min HHA. Pt left semi-reclined in bed with bed alarm on, call bell in reach, and needs met.   Therapy Documentation Precautions:  Precautions Precautions: Fall Precaution Comments: NWB RUE- no shoulder ROM, ok wrist and hand. Sling OOB Required Braces or Orthoses: Sling Restrictions Weight Bearing Restrictions: Yes RUE Weight Bearing: Non weight bearing Pain: Pain Assessment Pain Scale: 0-10 Pain Score: 2  Pain Type: Acute pain Pain Location: Shoulder Pain Orientation: Right Pain Descriptors / Indicators: Aching Pain Onset: On-going Patients Stated Pain Goal: 1 Pain Intervention(s): Repositioned Multiple Pain Sites: No  Therapy/Group: Individual  Therapy  Valma Cava 04/10/2020, 11:05 AM

## 2020-04-10 NOTE — Progress Notes (Signed)
[facility] PHYSICAL MEDICINE & REHABILITATION PROGRESS NOTE  Subjective/Complaints: A little restless last night. Couldn't find a comfortable spot in bed. Otherwise feels that she's doing well. Pain generally controlled  ROS: Patient denies fever, rash, sore throat, blurred vision, nausea, vomiting, diarrhea, cough, shortness of breath or chest pain,   headache, or mood change.    Objective: Vital Signs: Blood pressure (!) 175/79, pulse 78, temperature 98.2 F (36.8 C), temperature source Oral, resp. rate 14, height 5' (1.524 m), weight 69.5 kg, SpO2 92 %. No results found. Recent Labs    04/10/20 0538  WBC 7.7  HGB 10.7*  HCT 31.4*  PLT 417*   Recent Labs    04/10/20 0538  NA 140  K 3.1*  CL 101  CO2 27  GLUCOSE 93  BUN 8  CREATININE 0.59  CALCIUM 8.2*    Intake/Output Summary (Last 24 hours) at 04/10/2020 1103 Last data filed at 04/10/2020 0831 Gross per 24 hour  Intake 600 ml  Output --  Net 600 ml        Physical Exam: BP (!) 175/79 (BP Location: Left Arm)   Pulse 78   Temp 98.2 F (36.8 C) (Oral)   Resp 14   Ht 5' (1.524 m)   Wt 69.5 kg   SpO2 92%   BMI 29.92 kg/m  Constitutional: No distress . Vital signs reviewed. HEENT: EOMI, oral membranes moist Neck: supple Cardiovascular: RRR without murmur. No JVD    Respiratory/Chest: CTA Bilaterally without wheezes or rales. Normal effort, [place]  GI/Abdomen: BS +, non-tender, non-distended Ext: no clubbing, cyanosis, bruising right hip, especially posteriorly Psych: pleasant and cooperative Skin: scattered bruising Musc: Right shoulder and right hip with edema and tenderness. Neuro: Alert Motor: Right upper extremity proximally limited by sling and pain, handgrip 3/5, unchanged Left upper extremity: 5/5 proximal distal Right lower extremity: 3-/5 proximal to 4/5distal  Left lower extremity: 4+/5 proximal distal   Assessment/Plan: 1. Functional deficits which require 3+ hours per day of interdisciplinary  therapy in a comprehensive inpatient rehab setting.  Physiatrist is providing close team supervision and 24 hour management of active medical problems listed below.  Physiatrist and rehab team continue to assess barriers to discharge/monitor patient progress toward functional and medical goals   Care Tool:  Bathing  Bathing activity did not occur: Safety/medical concerns           Bathing assist       Upper Body Dressing/Undressing Upper body dressing Upper body dressing/undressing activity did not occur (including orthotics): Safety/medical concerns      Upper body assist      Lower Body Dressing/Undressing Lower body dressing    Lower body dressing activity did not occur: Safety/medical concerns       Lower body assist       Toileting Toileting    Toileting assist Assist for toileting: Moderate Assistance - Patient 50 - 74%     Transfers Chair/bed transfer  Transfers assist  Chair/bed transfer activity did not occur: Safety/medical concerns (dizziness in standing limiting safety with mobility)  Chair/bed transfer assist level: Minimal Assistance - Patient > 75%     Locomotion Ambulation   Ambulation assist   Ambulation activity did not occur: Safety/medical concerns          Walk 10 feet activity   Assist  Walk 10 feet activity did not occur: Safety/medical concerns        Walk 50 feet activity   Assist Walk 50 feet with 2  turns activity did not occur: Safety/medical concerns         Walk 150 feet activity   Assist Walk 150 feet activity did not occur: Safety/medical concerns         Walk 10 feet on uneven surface  activity   Assist Walk 10 feet on uneven surfaces activity did not occur: Safety/medical concerns         Wheelchair     Assist     Wheelchair activity did not occur: Safety/medical concerns (unable to transfer to w/c due to dizziness)         Wheelchair 50 feet with 2 turns  activity    Assist    Wheelchair 50 feet with 2 turns activity did not occur: Safety/medical concerns       Wheelchair 150 feet activity     Assist  Wheelchair 150 feet activity did not occur: Safety/medical concerns        Medical Problem List and Plan: 1.  Deficits with mobility, transfers, ADLs secondary to polytrauma with TBI.  -Continue CIR therapies including PT, OT SLP 2.  DVT Prophylaxis/Anticoagulation: Mechanical: Sequential compression devices, below knee Bilateral lower extremities Pharmaceutical: Lovenox.              Antiplatelets: Aspirin 81 3.  Acute on chronic pain Management:              Tylenol as needed             Robaxin 1000 mg every 8 scheduled  Monitor with increased exertion 4. Mood: Team support             Antipsychotic: Wellbutrin 5. Neuropsych: This patient is?  Fully capable of making decisions on her own behalf. 6. Skin/Wound Care: Routine skin care 7. Fluids/Electrolytes/Nutrition: Monitor I/Os.             -I personally reviewed the patient's labs today.     -replace potassium 3.1   -albumin low---protein supp  8.  Right proximal humeral fracture: Nonweightbearing per Ortho and sling. 9.  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  CTA of head/neck no arterial injury. 10.  C2-4 transverse process fracture: C-collar not required per neurosurgery 11.  Essential hypertension             Norvasc 5mg  daily  Monitor with increased mobility 12.  Marginal zone lymphoma             Follow-up as outpatient with Dr. Jonette Eva 13.  Acute hypoxic respiratory failure: Tracheobronchitis respiratory cultures with Pseudomonas.               IV cefepime 2/4-2/8. Afebrile,   -encouarge OOB, IS 14.  Constipation             Colace 100 mg twice daily             Increase bowel meds as necessary 15.  Hypothyroidism: Synthroid 125 daily 16.  GERD: Protonix 20 mg daily  LOS: 2 days A FACE TO FACE EVALUATION WAS  PERFORMED  Meredith Staggers 04/10/2020, 11:03 AM

## 2020-04-10 NOTE — Plan of Care (Signed)
  Problem: Consults Goal: RH BRAIN INJURY PATIENT EDUCATION Description: Description: See Patient Education module for eduction specifics Outcome: Progressing   Problem: RH SKIN INTEGRITY Goal: RH STG SKIN FREE OF INFECTION/BREAKDOWN Description: Mod I  Outcome: Progressing Goal: RH STG MAINTAIN SKIN INTEGRITY WITH ASSISTANCE Description: STG Maintain Skin Integrity With Mod I Assistance. Outcome: Progressing   Problem: RH SAFETY Goal: RH STG ADHERE TO SAFETY PRECAUTIONS W/ASSISTANCE/DEVICE Description: STG Adhere to Safety Precautions With Mod I Assistance/Device. Outcome: Progressing Goal: RH STG DECREASED RISK OF FALL WITH ASSISTANCE Description: STG Decreased Risk of Fall With Mod I Assistance. Outcome: Progressing   Problem: RH COGNITION-NURSING Goal: RH STG USES MEMORY AIDS/STRATEGIES W/ASSIST TO PROBLEM SOLVE Description: STG Uses Memory Aids/Strategies With Mod I Assistance to Problem Solve. Outcome: Progressing   Problem: RH PAIN MANAGEMENT Goal: RH STG PAIN MANAGED AT OR BELOW PT'S PAIN GOAL Description: No pain Outcome: Progressing   Problem: RH KNOWLEDGE DEFICIT BRAIN INJURY Goal: RH STG INCREASE KNOWLEDGE OF SELF CARE AFTER BRAIN INJURY Description: Mod I  Outcome: Progressing

## 2020-04-10 NOTE — Progress Notes (Signed)
Speech Language Pathology Daily Session Note  Patient Details  Name: Amanda [name] MRN: [id number] Date of Birth: 1940-09-03  Today's Date: 04/10/2020 SLP Individual Time: 0730-0825 SLP Individual Time Calculation (min): 55 min  Short Term Goals: Week 1: SLP Short Term Goal 1 (Week 1): Pt will complete mildly complex tasks with supervision verbal cues.  Skilled Therapeutic Interventions: Skilled treatment session focused on cognitive goals. SLP facilitated session by providing extra time and overall supervision level verbal cues for complex problem solving and organization during a complex scheduling task. Patient demonstrated selective attention to task for ~30 minutes in a moderately distracting environment with Mod I. She was also overall Mod I for recall of events that led to hospitalization, current injuries and current weightbearing precautions. Patient left upright in bed with alarm on and all needs within reach. Continue with current plan of care.      Pain No reports of pain   Therapy/Group: Individual Therapy  Sharmayne Jablon 04/10/2020, 12:52 PM

## 2020-04-10 NOTE — Progress Notes (Signed)
Physical Therapy Session Note  Patient Details  Name: Amanda [name] MRN: [id number] Date of Birth: 13-Feb-1941  Today's Date: 04/10/2020 PT Individual Time: 1430-1528 PT Individual Time Calculation (min): 58 min   Short Term Goals: Week 1:  PT Short Term Goal 1 (Week 1): Patient will performe bed mobility with CGA while maintain R UE NWB without cues. PT Short Term Goal 2 (Week 1): Patient will perform basic transfers with CGA for safety/balance using LRAD. PT Short Term Goal 3 (Week 1): Patient will ambulate >50 ft with CGA for safety/balance. PT Short Term Goal 4 (Week 1): Patient will initiate stair training.  Skilled Therapeutic Interventions/Progress Updates: Pt presents supine in bed and agreeable to participate in therapy.  Pt required total A to don R sling although able to roll to left.  Pt BP supine @ 180/80, then transferred sup to sit w/ min A log roll technique.  BP seated at 164/84, then transfers sit to stand w/ min A.  BP measured at 134/84 w/o any symptoms.  Pt performed SPT w/ min A and HHA to BSC.  Pt continent of urine, but requires min A for clothing management on right, independent for pericare.  Pt amb 5' w/ HHA to w/c.  Pt negotiated w/c through room and hallways to gym x 150'.  Pt negotiated 4 steps w/ 1 rail and mod A, step to gait pattern.  Pt began c/o nausea during performance but once sitting in w/c, nausea dissipates, BP at 159/86 and O2 sats .  Pt wheeled to room for energy conservation.  Pt amb w/ HHA x 20' into room to bed and performs sit to supine w/ CGA.  Bed alarm on and all needs in reach.     Therapy Documentation Precautions:  Precautions Precautions: Fall Precaution Comments: NWB RUE- no shoulder ROM, ok wrist and hand. Sling OOB Required Braces or Orthoses: Sling Restrictions Weight Bearing Restrictions: Yes RUE Weight Bearing: Non weight bearing General:   Vital Signs: Therapy Vitals Temp: 97.9 F (36.6 C) Pulse Rate: 80 Resp: 19 BP: (!)  161/68 Patient Position (if appropriate): Lying Oxygen Therapy SpO2: 94 % O2 Device: Room Air Pain: 0/10 RUE, 3/10 c/spine        Therapy/Group: Individual Therapy  [name] 04/10/2020, 3:39 PM

## 2020-04-10 NOTE — Care Management (Signed)
[facility] Individual Statement of Services  Patient Name:  Amanda [name]  Date:  04/10/2020  Welcome to [facility].  Our goal is to provide you with an individualized program based on your diagnosis and situation, designed to meet your specific needs.  With this comprehensive rehabilitation program, you will be expected to participate in at least 3 hours of rehabilitation therapies Monday-Friday, with modified therapy programming on the weekends.  Your rehabilitation program will include the following services:  Physical Therapy (PT), Occupational Therapy (OT), Speech Therapy (ST), 24 hour per day rehabilitation nursing, Therapeutic Recreaction (TR), Psychology, Neuropsychology, Care Coordinator, Rehabilitation Medicine, Nutrition Services, Pharmacy Services and Other  Weekly team conferences will be held on Tuesdays to discuss your progress.  Your Inpatient Rehabilitation Care Coordinator will talk with you frequently to get your input and to update you on team discussions.  Team conferences with you and your family in attendance may also be held.  Expected length of stay: 10-14 days    Overall anticipated outcome: Supervision  Depending on your progress and recovery, your program may change. Your Inpatient Rehabilitation Care Coordinator will coordinate services and will keep you informed of any changes. Your Inpatient Rehabilitation Care Coordinator's name and contact numbers are listed  below.  The following services may also be recommended but are not provided by the [facility] will be made to provide these services after discharge if needed.  Arrangements include referral to agencies that provide these services.  Your insurance has been verified to be:  Medicare A/B  Your primary  doctor is:  Roma Schanz  Pertinent information will be shared with your doctor and your insurance company.  Inpatient Rehabilitation Care Coordinator:  Cathleen Corti [phone] or ([phone]  Information discussed with and copy given to patient by: Rana Snare, 04/10/2020, 9:54 AM

## 2020-04-10 NOTE — Progress Notes (Addendum)
Inpatient Rehabilitation Care Coordinator Assessment and Plan Patient Details  Name: Amanda [name] MRN: [id number] Date of Birth: March 24, 1940  Today's Date: 04/10/2020  Hospital Problems: Principal Problem:   Multiple trauma Active Problems:   TBI (traumatic brain injury) ([place])   Chronic pain syndrome   Essential hypertension   Tracheobronchitis  Past Medical History:  Past Medical History:  Diagnosis Date  . Anemia   . Arthritis   . Bronchiectasis ([place])   . Cancer ([place])   . Cervicalgia   . Constipation, chronic   . Essential hypertension   . GERD (gastroesophageal reflux disease)    zantac  . Heart murmur   . History of blood transfusion [place]  . Hyperlipidemia   . Hypertension   . Hyperthyroidism   . Hypothyroid   . Hypothyroidism   . Lumbar burst fracture ([place])   . Lymphoproliferative disorder ([place])   . Macular degeneration 2013   Both eyes   . Macular degeneration, bilateral   . 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 Osteopenia   . Pneumonia   . Pneumonia due to COVID-19 virus 2021   Required hospitalization  . PONV (postoperative nausea and vomiting)    needs little anesthesia  . Shingles   . Shortness of breath    on exertion  . Spleen enlarged   . SUI (stress urinary incontinence, female)   . Urinary, incontinence, stress female   . Wears glasses    Past Surgical History:  Past Surgical History:  Procedure Laterality Date  . BREAST EXCISIONAL BIOPSY Left 1980  . CARPAL TUNNEL RELEASE  1999  . CATARACT EXTRACTION  2009, 2011   BOTH EYES  . CATARACT EXTRACTION, BILATERAL    . [place]  . CESAREAN SECTION    . COLONOSCOPY      Dr Cristina Gong  . DILATION AND CURETTAGE OF UTERUS     X2  . HYSTEROSCOPY WITH D & C  01/07/2012   Procedure: DILATATION AND CURETTAGE /HYSTEROSCOPY;  Surgeon: Terrance Mass, MD;  Location: [facility];  Service: Gynecology;  Laterality: N/A;  intrauterine foley catheter for tamponode   . IR IMAGING GUIDED  PORT INSERTION  07/15/2018  . LYMPH NODE BIOPSY Left 05/26/2018   Procedure: LEFT AXILLARY LYMPH NODE BIOPSY;  Surgeon: Fanny Skates, MD;  Location: [facility];  Service: General;  Laterality: Left;  . ORIF ANKLE FRACTURE Left 12/12/2018  . ORIF ANKLE FRACTURE Left 12/12/2018   Procedure: OPEN REDUCTION INTERNAL FIXATION (ORIF) ANKLE FRACTURE;  Surgeon: Meredith Pel, MD;  Location: [facility];  Service: Orthopedics;  Laterality: Left;  . ORIF ANKLE FRACTURE Left 12/2018  . TONSILLECTOMY    . TONSILLECTOMY AND ADENOIDECTOMY    . TUBAL LIGATION     BY LAPAROSCOPY  . WISDOM TOOTH EXTRACTION     Social History:  reports that she has never smoked. She has never used smokeless tobacco. She reports current alcohol use. She reports that she does not use drugs.  Family / Support Systems Marital Status: Married How Long?: 13 years Patient Roles: Spouse,Parent Spouse/Significant Other: [name] (husband): [phone] Children: 2 adult children- [name] (lives in [place] [phone]) and [name] (lives in [place], [place]). Other Supports: Pt reports husband's children live locally, and will be able to assist as needed. They are a blended family. he has 3 adult daughters and 1 son/18 grandchildren. Anticipated Caregiver: husband Ability/Limitations of Caregiver: none reported Caregiver Availability: 24/7 Family Dynamics: Pt is retired and lives with husband.  Social History Preferred language: English Religion: Baptist Cultural Background: Pt worked as a Forensic psychologist until 1998. Reports that normal activies include going to [facility] for deep water exercise MWF; active in church; and enjoys gardening. Education: college grad Read: Yes Write: Yes Employment Status: Retired Date Retired/Disabled/Unemployed: Museum/gallery curator Issues: Denies Guardian/Conservator: N/A   Abuse/Neglect Abuse/Neglect Assessment Can Be Completed: Yes Physical Abuse: Denies Verbal Abuse: Denies Sexual  Abuse: Denies Exploitation of patient/patient's resources: Denies Self-Neglect: Denies  Emotional Status Pt's affect, behavior and adjustment status: pt in good spirits at time of visit Recent Psychosocial Issues: Denies Psychiatric History: Denies Substance Abuse History: Denies; rare occassional EtoH use.  Patient / Family Perceptions, Expectations & Goals Pt/Family understanding of illness & functional limitations: Pt has general  understanding of care needs Premorbid pt/family roles/activities: Independent Anticipated changes in roles/activities/participation: Assistance with ADLs/IADLs Pt/family expectations/goals: Pt goal is to "be as mobile as I was previously; totally dependent."  [employer]: None Premorbid Home Care/DME Agencies: None Transportation available at discharge: husband Resource referrals recommended: Neuropsychology  Discharge Planning Living Arrangements: Spouse/significant other Support Systems: Spouse/significant other,Children,Other relatives Type of Residence: Private residence Insurance Resources: [employer] (specify) (Cigna-supp; Cigna-Rx) Museum/gallery curator Resources: Therapist, art Screen Referred: No Living Expenses: Own Money Management: Spouse,Patient Does the patient have any problems obtaining your medications?: No Home Management: both manage home care needs Patient/Family Preliminary Plans: TBD Care Coordinator Anticipated Follow Up Needs: HH/OP Expected length of stay: 10-14 days  Clinical Impression SW met with pt in room to introduce self, explain role, and discuss discahrge process. Pt is not a English as a second language teacher. [place] (husband). DME: RW, w/c, 3in1 BSC, TTB. Pt aware Sw to follow-up with her husband.  SW spoke with pt husband [name] [phone]) to introduce self, explain role, and discuss discharge process. He is aware SW to follow-up tomorow after team conference with  updates.   [name] 04/10/2020, 1:47 PM

## 2020-04-10 NOTE — Progress Notes (Signed)
Inpatient Rehabilitation  Patient information reviewed and entered into eRehab system by Braileigh Landenberger M. Johnpatrick Jenny, M.A., CCC/SLP, PPS Coordinator.  Information including medical coding, functional ability and quality indicators will be reviewed and updated through discharge.    

## 2020-04-11 DIAGNOSIS — S069X1S Unspecified intracranial injury with loss of consciousness of 30 minutes or less, sequela: Secondary | ICD-10-CM | POA: Diagnosis not present

## 2020-04-11 DIAGNOSIS — T07XXXA Unspecified multiple injuries, initial encounter: Secondary | ICD-10-CM | POA: Diagnosis not present

## 2020-04-11 DIAGNOSIS — J209 Acute bronchitis, unspecified: Secondary | ICD-10-CM | POA: Diagnosis not present

## 2020-04-11 DIAGNOSIS — I10 Essential (primary) hypertension: Secondary | ICD-10-CM | POA: Diagnosis not present

## 2020-04-11 MED ORDER — SORBITOL 70 % SOLN
30.0000 mL | Status: AC
  Administered 2020-04-11: 30 mL via ORAL
  Filled 2020-04-11: qty 30

## 2020-04-11 MED ORDER — SENNOSIDES-DOCUSATE SODIUM 8.6-50 MG PO TABS
2.0000 | ORAL_TABLET | Freq: Every day | ORAL | Status: DC
  Administered 2020-04-11 – 2020-04-19 (×9): 2 via ORAL
  Filled 2020-04-11 (×9): qty 2

## 2020-04-11 NOTE — Progress Notes (Signed)
Occupational Therapy Session Note  Patient Details  Name: Amanda [name] MRN: [id number] Date of Birth: 05/04/1940  Today's Date: 04/11/2020 OT Individual Time: 1430-1530 OT Individual Time Calculation (min): 60 min   Short Term Goals: Week 1:  OT Short Term Goal 1 (Week 1): Pt will don UB clothing with min A maintaining shoulder precautions OT Short Term Goal 2 (Week 1): Pt will don pants with min A with LRAD OT Short Term Goal 3 (Week 1): Pt will complete BSC transfer with CGA  Skilled Therapeutic Interventions/Progress Updates:    Pt greeted semi-reclined in bed and agreeable to OT treatment session. Pt declined bathing/dressing today but needed to use the bathroom. Min A to elevate trunk to come to sitting EOB. OT assist to don R bunny arm sling, then min HHA to ambulate to the bathroom. OT assist to manage clothing, with pt able to have continent void of bladder. Pt completed peri-care then needed OT assist to pull up pants. Pt ambulated back out of bathroom in similar fashion and stood at the sink to wash hands with CGA. Pt donned lip stick and was able to use R hand to hold tube while R hand unscrewed lid. Pt brought down to therapy gym and worked on dynamic standing balance on foam block while completing card matching task, min A for balance. Pt tolerated 3 bouts of standing activity but needed extended rest breaks in between. Pt then ambulated 2x around the gym with min HHA for endurance. Pt returned to room and left seated in wc with alarm belt on, call bell in reach, and needs met.   Therapy Documentation Precautions:  Precautions Precautions: Fall Precaution Comments: NWB RUE- no shoulder ROM, ok wrist and hand. Sling OOB Required Braces or Orthoses: Sling Restrictions Weight Bearing Restrictions: Yes RUE Weight Bearing: Non weight bearing Pain: Pain Assessment Pain Scale: 0-10 Pain Score:3 Pain Type: Acute pain Pain Location: Shoulder Pain Orientation: Right Pain  Descriptors / Indicators: Aching Pain Onset: On-going Pain Intervention(s): RN made aware   Therapy/Group: Individual Therapy  Valma Cava 04/11/2020, 2:53 PM

## 2020-04-11 NOTE — Patient Care Conference (Signed)
Inpatient RehabilitationTeam Conference and Plan of Care Update Date: 04/11/2020   Time: 10:41 AM    Patient Name: Amanda [name]      Medical Record Number: [id number]  Date of Birth: 1940/05/23 Sex: Female         Room/Bed: 4W24C/4W24C-01 Payor Info: Payor: MEDICARE / Plan: MEDICARE PART A AND B / Product Type: *No Product type* /    Admit Date/Time:  04/08/2020  2:54 PM  Primary Diagnosis:  Multiple trauma  Hospital Problems: Principal Problem:   Multiple trauma Active Problems:   TBI (traumatic brain injury) ([place])   Chronic pain syndrome   Essential hypertension   Tracheobronchitis    Expected Discharge Date: Expected Discharge Date: 04/22/20  Team Members Present: Physician leading conference: Dr. Alger Simons Care Coodinator Present: Loralee Pacas, LCSWA;Destinee Taber Creig Hines, RN, BSN, [place] Nurse Present: Barnabas Lister, RN PT Present: Magda Kiel, PT OT Present: Cherylynn Ridges, OT SLP Present: Weston Anna, SLP PPS Coordinator present : Gunnar Fusi, SLP     Current Status/Progress Goal Weekly Team Focus  Bowel/Bladder   Continent of B/B, LBM 04/07/2020  Maintain continence  Assess Qshift and PRN   Swallow/Nutrition/ Hydration             ADL's   Min A transfers, Mod A BADLs  Supervision  self-care retraining, activity tolerance, pain management, transfers   Mobility   min A transfers, gait HHA 20', mod A stairs  S transfers, CGA for ambulation & stairs  gait, transfers, balance, safety, testing vestibular   Communication             Safety/Cognition/ Behavioral Observations  Supervision  Mod I  complex problem solving   Pain   No complaints of pain  Pain <3/10  Assess Qshift and PRN   Skin   Skin intact (bruising)  Maintain skin integrity  Assess Qshift and PRN     Discharge Planning:  D/c to home with 24/7 care from husband.   Team Discussion: [place] from attic, has numerous fractures. Continent B/B. PRN Tylenol with pain 5-6/10. Last dose of  antibiotic at 2000 this evening. Patient and husband are only two at the home.  Patient on target to meet rehab goals: Min assist for transfers, mod assist on the right side. [place] assist with gait, mod assist with stairs. Gets nauseated. Going to test vestibular. Speech is working on complex problem solving. Will not need speech for very long.   *See Care Plan and progress notes for long and short-term goals.   Revisions to Treatment Plan:  MD adding bowel medications and working on pain control.  Teaching Needs: Family education, medication management, skin/wound care, transfer training, gait training, pain management.  Current Barriers to Discharge: Inaccessible home environment, Decreased caregiver support, Home enviroment access/layout, Lack of/limited family support, Weight bearing restrictions and Behavior  Possible Resolutions to Barriers: Continue current medications, provide emotional support to patient and family.     Medical Summary Current Status: TBI and polytrauma after fall. pseudomonas tracheobronchitis---cefipime. pain issues RUE/RLE. respiratory status improving  Barriers to Discharge: Medical stability   Possible Resolutions to [employer] Focus: daily mgt of ID considerations, assessment of labs/pt data, pain control   Continued Need for Acute Rehabilitation Level of Care: The patient requires daily medical management by a physician with specialized training in physical medicine and rehabilitation for the following reasons: Direction of a multidisciplinary physical rehabilitation program to maximize functional independence : Yes Medical management of patient stability for increased activity during participation in  an intensive rehabilitation regime.: Yes Analysis of laboratory values and/or radiology reports with any subsequent need for medication adjustment and/or medical intervention. : Yes   I attest that I was present, lead the team conference, and concur with  the assessment and plan of the team.   Cristi Loron 04/11/2020, 3:30 PM

## 2020-04-11 NOTE — Progress Notes (Signed)
Occupational Therapy Session Note  Patient Details  Name: Amanda [name] MRN: [id number] Date of Birth: [birth year]  Today's Date: 04/11/2020 OT Individual Time: 1015-1100 OT Individual Time Calculation (min): 45 min    Short Term Goals: Week 1:  OT Short Term Goal 1 (Week 1): Pt will don UB clothing with min A maintaining shoulder precautions OT Short Term Goal 2 (Week 1): Pt will don pants with min A with LRAD OT Short Term Goal 3 (Week 1): Pt will complete BSC transfer with CGA  Skilled Therapeutic Interventions/Progress Updates:    Patient in bed, alert and ready for therapy session.  she denies pain when at rest.  She is pleasant and cooperative t/o session.  Supine to sitting edge of bed with min A, socks on with max A.  Sit to stand and ambulation with NBQC CGA to/from bed, toilet and w/c - she completed toileting with min A for pants down/up.  Oral care and hand hygiene in seated position at sink with set up.  Provided foam sponge for right hand activity and edema management.  Completed left shoulder exercises seated and right shoulder pendulums in stance with CGA - no pain.  She ambulated back to bed at close of session - able to get both legs into bed.  Bed alarm set and call bell in reach.    Therapy Documentation Precautions:  Precautions Precautions: Fall Precaution Comments: NWB RUE- no shoulder ROM, ok wrist and hand. Sling OOB Required Braces or Orthoses: Sling Restrictions Weight Bearing Restrictions: Yes RUE Weight Bearing: Non weight bearing Other Treatments:     Therapy/Group: Individual Therapy  Carlos Levering 04/11/2020, 7:40 AM

## 2020-04-11 NOTE — Progress Notes (Signed)
Speech Language Pathology Daily Session Note  Patient Details  Name: Amanda [name] MRN: [id number] Date of Birth: 03-31-1940  Today's Date: 04/11/2020 SLP Individual Time: 1345-1411 SLP Individual Time Calculation (min): 26 min  Short Term Goals: Week 1: SLP Short Term Goal 1 (Week 1): Pt will complete mildly complex tasks with supervision verbal cues.  Skilled Therapeutic Interventions: Pt was seen for skilled ST targeting cognitive goals. Upon arrival, pt was reporting level 6 shoulder pain, and Min A verbal cues were provided for problem solving and recall of last time she had medications (pt was within 30 mins accuracy of actual last time of administration, per records in chart). Pt also completed a complex scheduling based deductive reasoning task with overall Supervision A verbal and visual cues for organization and problem solving within task. Pt reported feeling dissatisfied with legibility of her handwriting, so dicussed other ways to problem solve/compensate such as using notes function in cell phone. Pt reported she feels her handwriting should be "back to normal" by d/c date anticipated next week. Pt left laying in bed with alarm set and needs within reach, RN present. Continue per current plan of care.       Pain Pain Assessment Pain Scale: 0-10 Pain Score: 6  Pain Type: Acute pain Pain Location: Shoulder Pain Orientation: Right Pain Descriptors / Indicators: Aching Pain Onset: On-going Pain Intervention(s): RN made aware  Therapy/Group: Individual Therapy  Arbutus Leas 04/11/2020, 7:26 AM

## 2020-04-11 NOTE — IPOC Note (Signed)
Overall Plan of Care [facility]) Patient Details Name: Amanda [name] MRN: [id number] DOB: Jun 13, 1940  Admitting Diagnosis: Multiple trauma with TBI  Hospital Problems: Principal Problem:   Multiple trauma Active Problems:   TBI (traumatic brain injury) ([place])   Chronic pain syndrome   Essential hypertension   Tracheobronchitis     Functional Problem List: Nursing Safety,Pain,Endurance,Medication Management,Motor,Skin Integrity,Perception  PT Balance,Behavior,Edema,Endurance,Motor,Safety,Perception,Pain,Nutrition,Skin Integrity  OT Balance,Endurance,Edema,Motor,Pain  SLP Cognition  TR         Basic ADL's: OT Bathing,Dressing,Toileting     Advanced  ADL's: OT       Transfers: PT Bed Mobility,Bed to Chair,Car,Furniture  OT Toilet,Tub/Shower     Locomotion: PT Ambulation,Wheelchair Mobility,Stairs     Additional Impairments: OT None  SLP Social Cognition   Problem Solving  TR      Anticipated Outcomes Item Anticipated Outcome  Self Feeding no goal set  Swallowing      Basic self-care  supervision  Toileting  supervision   Bathroom Transfers supervision  Bowel/Bladder  Remain continent of bowel/bladder while in rehab  Transfers  supervision using LRAD  Locomotion  CGA using LRAD 100 ft  Communication     Cognition  mod I  Pain  <2  Safety/Judgment  Able to call for help and follow safety plan   Therapy Plan: PT Intensity: Minimum of 1-2 x/day ,45 to 90 minutes PT Frequency: 5 out of 7 days PT Duration Estimated Length of Stay: 10-14 days OT Intensity: Minimum of 1-2 x/day, 45 to 90 minutes OT Frequency: 5 out of 7 days OT Duration/Estimated Length of Stay: 2 weeks SLP Intensity: Minumum of 1-2 x/day, 30 to 90 minutes SLP Frequency: 3 to 5 out of 7 days SLP Duration/Estimated Length of Stay: 10-14 days   Due to the current state of emergency, patients may not be receiving their 3-hours of Medicare-mandated therapy.   Team Interventions: Nursing  Interventions Patient/Family Education,Pain Management,Disease Management/Prevention,Skin Care/Wound Management,Discharge Planning,Medication Management  PT interventions Ambulation/gait training,Cognitive remediation/compensation,Discharge planning,DME/adaptive equipment instruction,Functional mobility training,Pain management,Psychosocial support,Splinting/orthotics,Therapeutic Activities,UE/LE Strength taining/ROM,Wheelchair propulsion/positioning,UE/LE Coordination activities,Therapeutic Exercise,Stair training,Skin care/wound management,Patient/family education,Neuromuscular re-education,Functional Insurance risk surveyor  OT Interventions Balance/vestibular training,Discharge planning,Pain management,Self Care/advanced ADL retraining,Therapeutic Activities,UE/LE Coordination activities,Functional mobility training,Patient/family education,Therapeutic Exercise,DME/adaptive equipment instruction,Psychosocial support,UE/LE Strength taining/ROM  SLP Interventions Cognitive remediation/compensation,Cueing hierarchy,Functional tasks,Environmental controls,Patient/family education,Internal/external aids  TR Interventions    SW/CM Interventions Discharge Planning,Psychosocial Support,Patient/Family Education   Barriers to Discharge MD  Medical stability  Nursing      PT Inaccessible home environment,Lack of/limited family support,Decreased caregiver support    OT      SLP      SW       Team Discharge Planning: Destination: PT-Home ,OT- Home , SLP-Home Projected Follow-up: PT-Home health PT, OT-  None, SLP-Outpatient SLP Projected Equipment Needs: PT-To be determined, OT- To be determined, SLP-None recommended by SLP Equipment Details: PT-patient has RW, w/c , and TTB, will determine further needs based on patient progress, OT-  Patient/family involved in discharge planning: PT- Patient,  OT-Patient,Family  member/caregiver, SLP-Patient,Family member/caregiver  MD ELOS: 2/19  Medical Rehab Prognosis:  Excellent Assessment: The patient has been admitted for CIR therapies with the diagnosis of TBI with polytrauma. The team will be addressing functional mobility, strength, stamina, balance, safety, adaptive techniques and equipment, self-care, bowel and bladder mgt, patient and caregiver education, cognition, language, pain mgt, ortho precautions. Goals have been set at mod I with cognition and supervision with self-care and mobility.   Due to the current state of  emergency, patients may not be receiving their 3 hours per day of Medicare-mandated therapy.    Meredith Staggers, MD, FAAPMR      See Team Conference Notes for weekly updates to the plan of care

## 2020-04-11 NOTE — Progress Notes (Signed)
Physical Therapy Session Note  Patient Details  Name: Amanda [name] MRN: [id number] Date of Birth: 1940-12-27  Today's Date: 04/11/2020 PT Individual Time: 0835-0930 PT Individual Time Calculation (min): 55 min   Short Term Goals: Week 1:  PT Short Term Goal 1 (Week 1): Patient will performe bed mobility with CGA while maintain R UE NWB without cues. PT Short Term Goal 2 (Week 1): Patient will perform basic transfers with CGA for safety/balance using LRAD. PT Short Term Goal 3 (Week 1): Patient will ambulate >50 ft with CGA for safety/balance. PT Short Term Goal 4 (Week 1): Patient will initiate stair training.  Skilled Therapeutic Interventions/Progress Updates:    Patient in supine with RN in the room to provide medication with which pt had difficulty and needed extra time.  THT donned in supine with total A.  MD in to see pt.  Patient supine to sit with HOB elevated and use of rail with CGA.  Had donned sling in supine, but needed adjustments when sitting (total A).  Patient sit to stand with L HHA and min A to pivot to w/c.  Assisted in w/c to therapy gym.  Checked BP seated and standing noted SBP 156 seated and 129 standing, but pt denied symptoms.  Patient sit to stand to QC with CGA.  Ambulated with QC and min to CGA x 40'.  Needed rest on mat due to fatigue and nausea.  Performed step ups to 4" step with QC and min to mod A with one episode of posterior LOB x 3-4 reps prior to needing another rest break.  Patient ambulated back to w/c as previous.  Assisted to room in w/c  Stand step to bed with min A and QC.  Modified hall pike to L only due to R UE pain when attempting to R.  Positive for symptoms of dizziness and possible nystagmus (but unable to see direction) lasting about 15 seconds.  Patient educated on possible contributor to nausea and may benefit to attempt treatment at another session.  Patient left in supine with sling removed per her request and needs in reach, bed alarm active.    Therapy Documentation Precautions:  Precautions Precautions: Fall Precaution Comments: NWB RUE- no shoulder ROM, ok wrist and hand. Sling OOB Required Braces or Orthoses: Sling Restrictions Weight Bearing Restrictions: Yes RUE Weight Bearing: Non weight bearing Pain: Pain Assessment Pain Scale: 0-10 Pain Score: 6  Pain Type: Acute pain Pain Location: Shoulder Pain Orientation: Right;Left Pain Descriptors / Indicators: Aching Pain Onset: On-going Pain Intervention(s): Ambulation/increased activity;Repositioned    Therapy/Group: Individual Therapy  Reginia Naas  Magda Kiel, PT 04/11/2020, 8:49 AM

## 2020-04-11 NOTE — Progress Notes (Signed)
Patient ID: Amanda [name], female   DOB: 02/06/1941, 79 y.o.   MRN: [id number]  SW met with pt in room to provide updates from team conference, and d/c date 2/19. PT called pt husband while SW in room. Family education scheduled for Wednesday 2/16 1pm-3pm. SW will continue to follow pt for d/c needs.   Auria Chamberlain, MSW, LCSWA Office: [phone] Cell: [phone] Fax: [phone] 

## 2020-04-11 NOTE — Progress Notes (Signed)
[place] PHYSICAL MEDICINE & REHABILITATION PROGRESS NOTE  Subjective/Complaints: Still a little restless last night. Pain with movement.   ROS: Patient denies fever, rash, sore throat, blurred vision, nausea, vomiting, diarrhea, cough, shortness of breath or chest pain, headache, or mood change.     Objective: Vital Signs: Blood pressure (!) 160/74, pulse 66, temperature 97.8 F (36.6 C), resp. rate 18, height 5' (1.524 m), weight 69.5 kg, SpO2 92 %. No results found. Recent Labs    04/10/20 0538  WBC 7.7  HGB 10.7*  HCT 31.4*  PLT 417*   Recent Labs    04/10/20 0538  NA 140  K 3.1*  CL 101  CO2 27  GLUCOSE 93  BUN 8  CREATININE 0.59  CALCIUM 8.2*    Intake/Output Summary (Last 24 hours) at 04/11/2020 1204 Last data filed at 04/11/2020 0745 Gross per 24 hour  Intake 360 ml  Output --  Net 360 ml        Physical Exam: BP (!) 160/74 (BP Location: Left Arm)   Pulse 66   Temp 97.8 F (36.6 C)   Resp 18   Ht 5' (1.524 m)   Wt 69.5 kg   SpO2 92%   BMI 29.92 kg/m  Constitutional: No distress . Vital signs reviewed. HEENT: EOMI, oral membranes moist Neck: supple Cardiovascular: RRR without murmur. No JVD    Respiratory/Chest: CTA Bilaterally without wheezes or rales. Normal effort    GI/Abdomen: BS +, non-tender, non-distended Ext: no clubbing, cyanosis, or edema Psych: pleasant and cooperative Skin: scattered bruising right upper ext, RLE Musc: Right shoulder and right hip with edema and tenderness. Neuro: Alert Motor: Right upper extremity proximally limited by sling and pain, handgrip 3/5--stable Left upper extremity: 5/5 proximal distal Right lower extremity: 3-/5 proximal to 4/5distal  Left lower extremity: 4+/5 proximal distal   Assessment/Plan: 1. Functional deficits which require 3+ hours per day of interdisciplinary therapy in a comprehensive inpatient rehab setting.  Physiatrist is providing close team supervision and 24 hour management of  active medical problems listed below.  Physiatrist and rehab team continue to assess barriers to discharge/monitor patient progress toward functional and medical goals   Care Tool:  Bathing  Bathing activity did not occur: Safety/medical concerns Body parts bathed by patient: Right arm,Chest,Abdomen,Front perineal area,Right upper leg,Left upper leg,Face   Body parts bathed by helper: Left arm,Buttocks,Right lower leg,Left lower leg     Bathing assist Assist Level: Moderate Assistance - Patient 50 - 74%     Upper Body Dressing/Undressing Upper body dressing Upper body dressing/undressing activity did not occur (including orthotics): Safety/medical concerns What is the patient wearing?: Pull over shirt    Upper body assist Assist Level: Maximal Assistance - Patient 25 - 49%    Lower Body Dressing/Undressing Lower body dressing    Lower body dressing activity did not occur: Safety/medical concerns What is the patient wearing?: Pants     Lower body assist Assist for lower body dressing: Maximal Assistance - Patient 25 - 49%     Toileting Toileting    Toileting assist Assist for toileting: Moderate Assistance - Patient 50 - 74%     Transfers Chair/bed transfer  Transfers assist  Chair/bed transfer activity did not occur: Safety/medical concerns (dizziness in standing limiting safety with mobility)  Chair/bed transfer assist level: Moderate Assistance - Patient 50 - 74%     Locomotion Ambulation   Ambulation assist   Ambulation activity did not occur: Safety/medical concerns  Assist level: Minimal Assistance -  Patient > 75% Assistive device: Hand held assist Max distance: 20   Walk 10 feet activity   Assist  Walk 10 feet activity did not occur: Safety/medical concerns  Assist level: Minimal Assistance - Patient > 75% Assistive device: Hand held assist   Walk 50 feet activity   Assist Walk 50 feet with 2 turns activity did not occur: Safety/medical  concerns         Walk 150 feet activity   Assist Walk 150 feet activity did not occur: Safety/medical concerns         Walk 10 feet on uneven surface  activity   Assist Walk 10 feet on uneven surfaces activity did not occur: Safety/medical concerns         Wheelchair     Assist Will patient use wheelchair at discharge?: No Type of Wheelchair: Manual Wheelchair activity did not occur: Safety/medical concerns (unable to transfer to w/c due to dizziness)  Wheelchair assist level: Supervision/Verbal cueing Max wheelchair distance: 150    Wheelchair 50 feet with 2 turns activity    Assist    Wheelchair 50 feet with 2 turns activity did not occur: Safety/medical concerns   Assist Level: Supervision/Verbal cueing   Wheelchair 150 feet activity     Assist  Wheelchair 150 feet activity did not occur: Safety/medical concerns   Assist Level: Supervision/Verbal cueing    Medical Problem List and Plan: 1.  Deficits with mobility, transfers, ADLs secondary to polytrauma with TBI.  -Continue CIR therapies including PT, OT SLP  -ELOS 2/19 2.  DVT Prophylaxis/Anticoagulation: Mechanical: Sequential compression devices, below knee Bilateral lower extremities Pharmaceutical: Lovenox.              Antiplatelets: Aspirin 81 3.  Acute on chronic pain Management:              Tylenol as needed             Robaxin 1000 mg every 8 scheduled  Monitor with increased exertion 4. Mood: Team support             Antipsychotic: Wellbutrin 5. Neuropsych: This patient is?  Fully capable of making decisions on her own behalf. 6. Skin/Wound Care: Routine skin care 7. Fluids/Electrolytes/Nutrition: Monitor I/Os.             -I personally reviewed the patient's labs today.     -replaced potassium 3.1   -albumin low---protein supp  8.  Right proximal humeral fracture: Nonweightbearing per Ortho and sling. 9.  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  CTA of head/neck no arterial injury. 10.  C2-4 transverse process fracture: C-collar not required per neurosurgery 11.  Essential hypertension             Norvasc 5mg  daily  Monitor with increased mobility 12.  Marginal zone lymphoma             Follow-up as outpatient with Dr. Jonette Eva 13.  Acute hypoxic respiratory failure: Tracheobronchitis respiratory cultures with Pseudomonas.               IV cefepime 2/4-2/8.   -remains afebrile 2/8, activity tolerance better   -encouarge OOB, IS 14.  Constipation             Colace 100 mg twice daily             Iast bm 2/16   -sorbitol today   -change colace to senna-s 15.  Hypothyroidism: Synthroid 125 daily 16.  GERD:  Protonix 20 mg daily  LOS: 3 days A FACE TO FACE EVALUATION WAS PERFORMED  Meredith Staggers 04/11/2020, 12:04 PM

## 2020-04-12 MED ORDER — AMLODIPINE BESYLATE 10 MG PO TABS
10.0000 mg | ORAL_TABLET | Freq: Every day | ORAL | Status: DC
  Administered 2020-04-12 – 2020-04-20 (×9): 10 mg via ORAL
  Filled 2020-04-12 (×9): qty 1

## 2020-04-12 NOTE — Progress Notes (Signed)
PHYSICAL MEDICINE & REHABILITATION PROGRESS NOTE  Subjective/Complaints: Pt reports that she's doing well. Mentioned her dc date and is hoping that she might be able to leave sooner. Moved bowels yesterday  ROS: Patient denies fever, rash, sore throat, blurred vision, nausea, vomiting, diarrhea, cough, shortness of breath or chest pain,   headache, or mood change.    Objective: Vital Signs: Blood pressure (!) 163/79, pulse 67, temperature 97.8 F (36.6 C), temperature source Oral, resp. rate 16, height 5' (1.524 m), weight 69.5 kg, SpO2 95 %. No results found. Recent Labs    04/10/20 0538  WBC 7.7  HGB 10.7*  HCT 31.4*  PLT 417*   Recent Labs    04/10/20 0538  NA 140  K 3.1*  CL 101  CO2 27  GLUCOSE 93  BUN 8  CREATININE 0.59  CALCIUM 8.2*    Intake/Output Summary (Last 24 hours) at 04/12/2020 0831 Last data filed at 04/11/2020 2000 Gross per 24 hour  Intake 660 ml  Output --  Net 660 ml        Physical Exam: BP (!) 163/79   Pulse 67   Temp 97.8 F (36.6 C) (Oral)   Resp 16   Ht 5' (1.524 m)   Wt 69.5 kg   SpO2 95%   BMI 29.92 kg/m  Constitutional: No distress . Vital signs reviewed. HEENT: EOMI, oral membranes moist Neck: supple Cardiovascular: RRR without murmur. No JVD    Respiratory/Chest: CTA Bilaterally without wheezes or rales. Normal effort    GI/Abdomen: BS +, non-tender, non-distended Ext: no clubbing, cyanosis, or edema Psych: pleasant and cooperative Skin: scattered bruising right upper ext, RLE Musc: Right shoulder and right hip with edema and tenderness. Neuro: Alert Motor: Right upper extremity proximally limited by sling and pain, handgrip 3/5--stable Left upper extremity: 5/5 proximal distal Right lower extremity: 3-/5 proximal to 4/5distal  Left lower extremity: 4+/5 proximal distal   Assessment/Plan: 1. Functional deficits which require 3+ hours per day of interdisciplinary therapy in a comprehensive inpatient rehab  setting.  Physiatrist is providing close team supervision and 24 hour management of active medical problems listed below.  Physiatrist and rehab team continue to assess barriers to discharge/monitor patient progress toward functional and medical goals   Care Tool:  Bathing  Bathing activity did not occur: Safety/medical concerns Body parts bathed by patient: Right arm,Chest,Abdomen,Front perineal area,Right upper leg,Left upper leg,Face   Body parts bathed by helper: Left arm,Buttocks,Right lower leg,Left lower leg     Bathing assist Assist Level: Moderate Assistance - Patient 50 - 74%     Upper Body Dressing/Undressing Upper body dressing Upper body dressing/undressing activity did not occur (including orthotics): Safety/medical concerns What is the patient wearing?: Pull over shirt    Upper body assist Assist Level: Maximal Assistance - Patient 25 - 49%    Lower Body Dressing/Undressing Lower body dressing    Lower body dressing activity did not occur: Safety/medical concerns What is the patient wearing?: Pants,Underwear/pull up     Lower body assist Assist for lower body dressing: Moderate Assistance - Patient 50 - 74%     Toileting Toileting    Toileting assist Assist for toileting: Minimal Assistance - Patient > 75%     Transfers Chair/bed transfer  Transfers assist  Chair/bed transfer activity did not occur: Safety/medical concerns (dizziness in standing limiting safety with mobility)  Chair/bed transfer assist level: Minimal Assistance - Patient > 75%     Locomotion Ambulation   Ambulation assist  Ambulation activity did not occur: Safety/medical concerns  Assist level: Minimal Assistance - Patient > 75% Assistive device: Cane-quad Max distance: 40   Walk 10 feet activity   Assist  Walk 10 feet activity did not occur: Safety/medical concerns  Assist level: Minimal Assistance - Patient > 75% Assistive device: Hand held assist,Cane-quad    Walk 50 feet activity   Assist Walk 50 feet with 2 turns activity did not occur: Safety/medical concerns         Walk 150 feet activity   Assist Walk 150 feet activity did not occur: Safety/medical concerns         Walk 10 feet on uneven surface  activity   Assist Walk 10 feet on uneven surfaces activity did not occur: Safety/medical concerns         Wheelchair     Assist Will patient use wheelchair at discharge?: No Type of Wheelchair: Manual Wheelchair activity did not occur: Safety/medical concerns (unable to transfer to w/c due to dizziness)  Wheelchair assist level: Supervision/Verbal cueing Max wheelchair distance: 150    Wheelchair 50 feet with 2 turns activity    Assist    Wheelchair 50 feet with 2 turns activity did not occur: Safety/medical concerns   Assist Level: Supervision/Verbal cueing   Wheelchair 150 feet activity     Assist  Wheelchair 150 feet activity did not occur: Safety/medical concerns   Assist Level: Supervision/Verbal cueing    Medical Problem List and Plan: 1.  Deficits with mobility, transfers, ADLs secondary to polytrauma with TBI.  -Continue CIR therapies including PT, OT SLP  -ELOS 2/19 2.  DVT Prophylaxis/Anticoagulation: Mechanical: Sequential compression devices, below knee Bilateral lower extremities Pharmaceutical: Lovenox.              Antiplatelets: Aspirin 81 3.  Acute on chronic pain Management:              Tylenol as needed             Robaxin 1000 mg every 8 scheduled  Pain appears fairly well controlled 4. Mood: Team support             Antipsychotic: Wellbutrin 5. Neuropsych: This patient is?  Fully capable of making decisions on her own behalf. 6. Skin/Wound Care: Routine skin care 7. Fluids/Electrolytes/Nutrition: Monitor I/Os.             -hypokalemia;     -replaced potassium 3.1  -albumin low---protein supp  8.  Right proximal humeral fracture: Nonweightbearing per Ortho and  sling. 9.  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  CTA of head/neck no arterial injury. 10.  C2-4 transverse process fracture: C-collar not required per neurosurgery 11.  Essential hypertension             Norvasc 5mg  daily  2/9 bp with ongoing elevation--increase norvasc to 10mg  12.  Marginal zone lymphoma             Follow-up as outpatient with Dr. Jonette Eva 13.  Acute hypoxic respiratory failure: Tracheobronchitis respiratory cultures with Pseudomonas.               IV cefepime 2/4-2/8 completed.   -remains afebrile 2/8, activity tolerance better   -encouarge OOB, IS 14.  Constipation             large bm 2/8 after sorbitol  -continue senna-s at bedtime 15.  Hypothyroidism: Synthroid 125 daily 16.  GERD: Protonix 20 mg daily  LOS: 4 days A FACE  TO FACE EVALUATION WAS PERFORMED  Meredith Staggers 04/12/2020, 8:31 AM

## 2020-04-12 NOTE — Progress Notes (Signed)
Occupational Therapy Session Note  Patient Details  Name: Amanda [name] MRN: [id number] Date of Birth: 24-Sep-1940  Today's Date: 04/12/2020 OT Individual Time: 1430-1500 OT Individual Time Calculation (min): 30 min    Short Term Goals: Week 1:  OT Short Term Goal 1 (Week 1): Pt will don UB clothing with min A maintaining shoulder precautions OT Short Term Goal 2 (Week 1): Pt will don pants with min A with LRAD OT Short Term Goal 3 (Week 1): Pt will complete BSC transfer with CGA  Skilled Therapeutic Interventions/Progress Updates:    Pt received supine resting comfortably on her side, agreeable to OT session but preferring to stay in the room. She transferred to EOB with min cueing for positioning of her hips/LE with CGA. She held a 3 lb dumbbell in her L hand and completed UE circuit focused on endurance and strengthening for carryover to ADL/IADL tasks. With her RUE (shoulder immobilized) she completed very gentle elbow flex/ext with no weight. She requested assist with painting her nails and required max A d/t shoulder precautions. Pt was transferred back to supine with min A. She was assisted in placing pillows to comfortably lay on her side. Bed alarm set.   Therapy Documentation Precautions:  Precautions Precautions: Fall Precaution Comments: NWB RUE- no shoulder ROM, ok wrist and hand. Sling OOB Required Braces or Orthoses: Sling Restrictions Weight Bearing Restrictions: Yes RUE Weight Bearing: Non weight bearing  Therapy/Group: Individual Therapy  Curtis Sites 04/12/2020, 6:27 AM

## 2020-04-12 NOTE — Progress Notes (Signed)
Occupational Therapy Session Note  Patient Details  Name: Amanda [name] MRN: [id number] Date of Birth: 11-28-1940  Today's Date: 04/12/2020 OT Individual Time: 1027-2536 OT Individual Time Calculation (min): 58 min    Short Term Goals: Week 1:  OT Short Term Goal 1 (Week 1): Pt will don UB clothing with min A maintaining shoulder precautions OT Short Term Goal 2 (Week 1): Pt will don pants with min A with LRAD OT Short Term Goal 3 (Week 1): Pt will complete BSC transfer with CGA  Skilled Therapeutic Interventions/Progress Updates:    1:1. Pt received in w.c with pain in tailbone and RUE. RN alerted and delivered meds during session. Pt recalls this clinician from previous admission and asks appropriate questions and recounts incident that led to this admission. OT applies occlusive to RUE to prevent IV from getting wet. Pt completes HHA MIN A transfer into shower with sling donned. Pt doffs sling with MAX A and shirt with MOD A. Pants doffed total A with MIN A for standing balance. Pt completes bathing with A to wash back, buttocks, B feet and LUE. Pt requires rest breaks d/t fatigue. Pt dons shirt with MOD A, pants/underwear with MOD A and socks total A. PT returned to bed at end of session d/t fatigue. Exited session with pt seated in bed, exit alarm on and call light in reach   Therapy Documentation Precautions:  Precautions Precautions: Fall Precaution Comments: NWB RUE- no shoulder ROM, ok wrist and hand. Sling OOB Required Braces or Orthoses: Sling Restrictions Weight Bearing Restrictions: Yes RUE Weight Bearing: Non weight bearing General:   Vital Signs: Therapy Vitals Temp: 97.8 F (36.6 C) Temp Source: Oral Pulse Rate: 67 Resp: 16 BP: (!) 163/79 Oxygen Therapy SpO2: 95 % Pain:   ADL: ADL Eating: Modified independent Where Assessed-Eating: Edge of bed Grooming: Supervision/safety Where Assessed-Grooming: Edge of bed Upper Body Bathing: Unable to assess Lower  Body Bathing: Unable to assess Upper Body Dressing: Unable to assess Lower Body Dressing: Unable to assess Toileting: Moderate assistance Where Assessed-Toileting: Bedside Commode Toilet Transfer: Minimal assistance Toilet Transfer Method: Stand pivot Toilet Transfer Equipment: Bedside commode Tub/Shower Transfer Method: Unable to assess Social research officer, government: Unable to assess [employer] Transfer Method: Unable to assess Vision   Perception    Praxis   Exercises:   Other Treatments:     Therapy/Group: Individual Therapy  Tonny Branch 04/12/2020, 6:52 AM

## 2020-04-12 NOTE — Progress Notes (Signed)
Physical Therapy Session Note  Patient Details  Name: Amanda [name] MRN: [id number] Date of Birth: 05-07-1940  Today's Date: 04/12/2020 PT Individual Time: 1046-1200 PT Individual Time Calculation (min): 74 min   Short Term Goals: Week 1:  PT Short Term Goal 1 (Week 1): Patient will performe bed mobility with CGA while maintain R UE NWB without cues. PT Short Term Goal 2 (Week 1): Patient will perform basic transfers with CGA for safety/balance using LRAD. PT Short Term Goal 3 (Week 1): Patient will ambulate >50 ft with CGA for safety/balance. PT Short Term Goal 4 (Week 1): Patient will initiate stair training.  Skilled Therapeutic Interventions/Progress Updates: Pt presents semi-reclined in bed and agreeable to therapy.  Sling applied to R UE w/ max A, but pt able to rise arm off bed and roll to left.  Pt transfers sup to sit w/ CGA and verbal cues for use of siderail.  Pt able to bridge to EOB before transitioning to sitting.  Pt required Total A to don socks and shoes.  Pt amb w/ SBQC and CGA out of room into hallway.  Pt amb multiple trials up to 40' w/ c/o fatigue at conclusion.  Pt required seated rest breaks 2/2 fatigue and occasional c/o nausea, improved w/ sitting/rest and sips of water.  BP checked in sitting at 144/75 and then after performance of standing balance trials at 132/72, w/o c/o symptoms.  Pt amb in small gym to car.  Pt required min A for car transfer and requires seated rest breaks 2/2 nausea.  Discussed car transfers at SUV height.  Pt performed multiple sit to stand transfers w/ CGA and education on forward lean as well as hand placement especially w/ stand to sit.  Pt performed standing toe taps to 4" step x 20 w/ occasional LOB.  Pt returned to room and amb to bed , w/c brought to room and then performed SPT to w/c w/o AD.  Chair alarm placed and on and all needs in reach.     Therapy Documentation Precautions:  Precautions Precautions: Fall Precaution Comments: NWB  RUE- no shoulder ROM, ok wrist and hand. Sling OOB Required Braces or Orthoses: Sling Restrictions Weight Bearing Restrictions: Yes RUE Weight Bearing: Non weight bearing General:   Vital Signs:  Pain: 5/10 right shoulder, w/ pain meds.      Therapy/Group: Individual Therapy  [name] 04/12/2020, 12:31 PM

## 2020-04-12 NOTE — Progress Notes (Signed)
Physical Therapy Session Note  Patient Details  Name: Amanda [name] MRN: [id number] Date of Birth: [birth year]  Today's Date: 04/12/2020 PT Individual Time: 1345-1410 PT Individual Time Calculation (min): 25 min   Short Term Goals: Week 1:  PT Short Term Goal 1 (Week 1): Patient will performe bed mobility with CGA while maintain R UE NWB without cues. PT Short Term Goal 2 (Week 1): Patient will perform basic transfers with CGA for safety/balance using LRAD. PT Short Term Goal 3 (Week 1): Patient will ambulate >50 ft with CGA for safety/balance. PT Short Term Goal 4 (Week 1): Patient will initiate stair training.  Skilled Therapeutic Interventions/Progress Updates:  Pt received supine in bed resting but agreeable to therapy session. Pt not wearing TED hose and no reports of lightheadedness during session. Already wearing R UE sling and pt able to recall precautions with increased time and only question cuing. Supine>sitting L EOB, HOB partially elevated and using bedrail, min assist trunk upright. Sit<>stands EOB<>QC CGA/min assist for lifting/steadying. Gait training ~62ft, ~17ft using L QC with CGA for steadying - demos slow gait speed with decreased B LE step lengths and guarded trunk posturing - limited distance due to impaired endurance. Pt received and maintained on RA - demos SOB after each ambulation with SpO2 >91% and recovering to ~96% after rest. Repeated sit<>stands to/from EOB, L UE support, CGA for steadying targeting B LE strengthening. Sit>supine min assist for B LE management. Upon returning to supine pt reports feeling "like the room is spinning" - unable to visualize a nystagmus at this time - reports symptoms dissipate in <60minute. Per notes, primary PT planning to follow-up on vestibular treatment. Pt had reported sacral pain from bed therefore educated and encouraged her to lie on L side - pt agreeable and with pillows and towels able to support and position pt comfortably. Left in  L sidelying with needs in reach, bed alarm on, and R UE sling still donned in preparation for next session.   Therapy Documentation Precautions:  Precautions Precautions: Fall Precaution Comments: NWB RUE- no shoulder ROM, ok wrist and hand. Sling OOB Required Braces or Orthoses: Sling Restrictions Weight Bearing Restrictions: Yes RUE Weight Bearing: Non weight bearing  Pain:   Reports sacral pain - educated on L sidelying for pressure relief - positioned on side at end of session.   Therapy/Group: Individual Therapy  Tawana Scale , PT, DPT, CSRS  04/12/2020, 12:16 PM

## 2020-04-13 NOTE — Progress Notes (Signed)
Speech Language Pathology Discharge Summary  Patient Details  Name: Amanda [name] MRN: [id number] Date of Birth: 1940/04/28  Today's Date: 04/13/2020 SLP Individual Time: 1345-1425 SLP Individual Time Calculation (min): 40 min   Skilled Therapeutic Interventions:  Skilled treatment session focused on cognitive goals. Patient was overall Mod I to recall of her current medications and their functions and verbalized appropriate medication management strategies for the home setting. SLP provided education regarding the importance of staying both cognitively and physically active at discharge to maintain and maximize overall functional independence. She verbalized understanding and verbally generated a list of activities she can currently perform that focuses on independence. Patient left upright in bed with alarm on and all needs within reach.    Patient has met 1 of 1 long term goals.  Patient to discharge at overall Modified Independent level.   Reasons goals not met: N/A   Clinical Impression/Discharge Summary: Patient has made excellent gains and has met 1 of 1 LTGs this admission. Currently, patient demonstrates behaviors consistent with a Rancho Level VIII and requires overall Mod I to complete functional and mildly complex tasks safely in regards to problem solving. Patient education is complete and patient is at her baseline level of cognitive functioning. Therefore, patient will be discharged from skilled SLP intervention with f/u not warranted at this time. Patient verbalized understanding and agreement.   Care Partner:  Caregiver Able to Provide Assistance: Yes     Recommendation:  None      Equipment: N/A   Reasons for discharge: Treatment goals met   Patient/Family Agrees with Progress Made and Goals Achieved: Yes    Gregorey Nabor, [place] 04/13/2020, 2:54 PM

## 2020-04-13 NOTE — Evaluation (Signed)
Recreational Therapy Assessment and Plan  Patient Details  Name: Amanda [name] MRN: [id number] Date of Birth: 1940-04-14 Today's Date: 04/13/2020  Rehab Potential:  Good ELOS:   d/c set for 2/19, however may be moved up due to progress  Assessment   Hospital Problem: Principal Problem:   Multiple trauma Active Problems:   TBI (traumatic brain injury) ([place])   Chronic pain syndrome   Essential hypertension   Tracheobronchitis   Past Medical History:      Past Medical History:  Diagnosis Date  . Anemia   . Arthritis   . Bronchiectasis ([place])   . Cancer ([place])   . Cervicalgia   . Constipation, chronic   . Essential hypertension   . GERD (gastroesophageal reflux disease)    zantac  . Heart murmur   . History of blood transfusion [place]  . Hyperlipidemia   . Hypertension   . Hyperthyroidism   . Hypothyroid   . Hypothyroidism   . Lumbar burst fracture ([place])   . Lymphoproliferative disorder ([place])   . Macular degeneration 2013   Both eyes   . Macular degeneration, bilateral   . 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 Osteopenia   . Pneumonia   . Pneumonia due to COVID-19 virus 2021   Required hospitalization  . PONV (postoperative nausea and vomiting)    needs little anesthesia  . Shingles   . Shortness of breath    on exertion  . Spleen enlarged   . SUI (stress urinary incontinence, female)   . Urinary, incontinence, stress female   . Wears glasses    Past Surgical History:       Past Surgical History:  Procedure Laterality Date  . BREAST EXCISIONAL BIOPSY Left 1980  . CARPAL TUNNEL RELEASE  1999  . CATARACT EXTRACTION  2009, 2011   BOTH EYES  . CATARACT EXTRACTION, BILATERAL    . [place]  . CESAREAN SECTION    . COLONOSCOPY      Dr Cristina Gong  . DILATION AND CURETTAGE OF UTERUS     X2  . HYSTEROSCOPY WITH D & C  01/07/2012   Procedure: DILATATION AND CURETTAGE /HYSTEROSCOPY;  Surgeon:  Terrance Mass, MD;  Location: [facility];  Service: Gynecology;  Laterality: N/A;  intrauterine foley catheter for tamponode   . IR IMAGING GUIDED PORT INSERTION  07/15/2018  . LYMPH NODE BIOPSY Left 05/26/2018   Procedure: LEFT AXILLARY LYMPH NODE BIOPSY;  Surgeon: Fanny Skates, MD;  Location: [facility];  Service: General;  Laterality: Left;  . ORIF ANKLE FRACTURE Left 12/12/2018  . ORIF ANKLE FRACTURE Left 12/12/2018   Procedure: OPEN REDUCTION INTERNAL FIXATION (ORIF) ANKLE FRACTURE;  Surgeon: Meredith Pel, MD;  Location: [facility];  Service: Orthopedics;  Laterality: Left;  . ORIF ANKLE FRACTURE Left 12/2018  . TONSILLECTOMY    . TONSILLECTOMY AND ADENOIDECTOMY    . TUBAL LIGATION     BY LAPAROSCOPY  . WISDOM TOOTH EXTRACTION      Assessment & Plan Clinical Impression: Patient is a 80 y.o. year old female with past medical history of lymphoma, macular degeneration, bronchiectasis, hypertension, hypothyroidism, GERD presented on 03/31/2020 after a fall from her attic. History taken from chart review and patient. She fell approximately 10 feet and was noted to have right-sided weakness with AMS. Her GCS was 7. Work-up revealed left frontoparietal lobe diffusion abnormality, right C2-C4 transverse process fracture, left first and second rib fractures, right humerus fracture. Hospital  course further complicated by drop in hemoglobin to 6.9, which required transfusion of PRBCs. Orthopedic surgery was consulted and recommended nonoperative management for right proximal humeral fracture with patient nonweightbearing and sling. Right hemiparesis was thought to be secondary to motor strip injury per neurosurgery. No need for cervical collar for cervical fracture per neurosurgery. She did require to be seen, but was later extubated. She was started on cefepime for Pseudomonas tracheobronchitis. Hospital course further complicated by acute blood loss anemia with hemoglobin 9.1 on  04/07/2020. Patient with associated pain and supplemental oxygen requirement. She was admitted to inpatient rehab due to weakness, weightbearing precautions, cognitive deficits. Please see preadmission assessment as well. Patient transferred to CIR on 04/08/2020 .   Pt presents with decreased activity tolerance, decreased functional mobility, decreased balance, decreased coordination, and difficulty maintaining precautions Limiting pt's independence with leisure/community pursuits.  Met with pt today to discuss leisure interests, coping strategies and activity analysis/potential modifications.  Pt is extremely motivated to regain further independence and return to previously enjoyed activities.     Plan  No further TR due to d/c date  Recommendations for other services: None   Discharge Criteria: Patient will be discharged from TR if patient refuses treatment 3 consecutive times without medical reason.  If treatment goals not met, if there is a change in medical status, if patient makes no progress towards goals or if patient is discharged from hospital.  The above assessment, treatment plan, treatment alternatives and goals were discussed and mutually agreed upon: by patient  [place] 04/13/2020, 12:29 PM

## 2020-04-13 NOTE — Progress Notes (Signed)
Physical Therapy Session Note  Patient Details  Name: Amanda [name] MRN: [id number] Date of Birth: 09-21-1940  Today's Date: 04/13/2020 PT Individual Time: 1003-1100 and 1302-1330 PT Individual Time Calculation (min): 57 min and 28 min  Short Term Goals: Week 1:  PT Short Term Goal 1 (Week 1): Patient will performe bed mobility with CGA while maintain R UE NWB without cues. PT Short Term Goal 2 (Week 1): Patient will perform basic transfers with CGA for safety/balance using LRAD. PT Short Term Goal 3 (Week 1): Patient will ambulate >50 ft with CGA for safety/balance. PT Short Term Goal 4 (Week 1): Patient will initiate stair training.  Skilled Therapeutic Interventions/Progress Updates:     1st Session: Pt received seated in [facility] and agrees to therapy. Reports some pain in "tailbone". Number not provided. PT provides rest breaks as needed to manage pain and also switches pt's WC cushion to Numa cushion for offloading. WC transport to gym for time management. Pt performs sit to stand transfer with quad cane and CGA. Pt ambulates x150' with quad cane and CGA, with PT providing cues for upright gaze to improve posture and balance. Pt ambulates with very short stride lengths and reports fatigue and some shortness of breath toward end of ambulation. Pt completes Nustep activity with bilateral lower extremities and L upper extremity, for strength and endurance training as well as reciprocal coordination. Pt performs 2x7:30 with seated rest break, on workload of 4 with steps per minute >50. Pt ambulates x100' with quad cane and cue for upright gaze to improve posture and balance and increasing gait speed to decrease risk for falls. Toward 2nd half of ambulation, pt begins using quad cane every two stride lengths. PT discusses potential to gait train without AD and pt is agreeable to try next session. Pt left seated in WC with alarm intact and all needs within reach.  2nd Session: Pt received supine in bed  and agrees to therapy. Supine to sit with supervision and use of bed features. Sit to stand and stand pivot transfer to [facility] without AD and with cues on positioning and sequencing. WC transport to gym for time management. Pt ambulates x100' without AD, with PT providing CGA and cues for upright gaze to improve posture and balance, increasing stride length and gait speed to decrease risk for falls. Pt then performs forward and backward ambulation x10' each to challenge dynamic balance, with CGA and cues to increase step length while ambulating backward. Pt then performs "push and release" test, for NMR for balance and righting reactions. PT initially counts to 3 out loud and releases pt, progressing to releasing pt spontaneously. Pt is able to step backward each trial to prevent fall. PT cues pt for larger step to prevent shuffling and pt able to improve mechanics. WC transport back to room. Stand pivot and sit to supine with cues on positioning. Pt left in bed with alarm intact and all needs within reach.    Therapy Documentation Precautions:  Precautions Precautions: Fall Precaution Comments: NWB RUE- no shoulder ROM, ok wrist and hand. Sling OOB Required Braces or Orthoses: Sling Restrictions Weight Bearing Restrictions: Yes RUE Weight Bearing: Non weight bearing    Therapy/Group: Individual Therapy  Breck Coons, PT, DPT 04/13/2020, 10:39 AM

## 2020-04-13 NOTE — Plan of Care (Signed)
Behavioral Plan   Rancho Level: VIII  Behavior to decrease/ eliminate:   None-no adverse behaviors noted at this time  Changes to environment:   Call bell and needs within reach Sling on right arm when OOB due to NWB  Interventions:  Bed alarm  Chair alarm Use single quad cane when ambulating   Recommendations for interactions with patient:  Clear and concise directions   Attendees:   Weston Anna, SLP Tereasa Coop, PT Cherylynn Ridges, OT

## 2020-04-13 NOTE — Progress Notes (Signed)
PHYSICAL MEDICINE & REHABILITATION PROGRESS NOTE  Subjective/Complaints: Had a pretty good night. Therapy helped show her how to move in bed a little easier so that she could adjust herself when uncomfortable  ROS: Patient denies fever, rash, sore throat, blurred vision, nausea, vomiting, diarrhea, cough, shortness of breath or chest pain,  headache, or mood change.   Objective: Vital Signs: Blood pressure (!) 142/72, pulse 69, temperature 97.6 F (36.4 C), resp. rate 16, height 5' (1.524 m), weight 69.5 kg, SpO2 94 %. No results found. No results for input(s): WBC, HGB, HCT, PLT in the last 72 hours. No results for input(s): NA, K, CL, CO2, GLUCOSE, BUN, CREATININE, CALCIUM in the last 72 hours.  Intake/Output Summary (Last 24 hours) at 04/13/2020 1033 Last data filed at 04/12/2020 1858 Gross per 24 hour  Intake 220 ml  Output --  Net 220 ml        Physical Exam: BP (!) 142/72 (BP Location: Left Arm)   Pulse 69   Temp 97.6 F (36.4 C)   Resp 16   Ht 5' (1.524 m)   Wt 69.5 kg   SpO2 94%   BMI 29.92 kg/m  Constitutional: No distress . Vital signs reviewed. HEENT: EOMI, oral membranes moist Neck: supple Cardiovascular: RRR without murmur. No JVD    Respiratory/Chest: CTA Bilaterally without wheezes or rales. Normal effort    GI/Abdomen: BS +, non-tender, non-distended Ext: no clubbing, cyanosis,sl swelling right hand Psych: pleasant and cooperative Skin: scattered bruising right upper ext, RLE Musc: Right shoulder and right hip with edema and tenderness. Neuro: Alert Motor: Right upper extremity proximally limited by sling and pain, handgrip 3/5--stable Left upper extremity: 5/5 proximal distal Right lower extremity: 3-/5 proximal to 4/5distal  Left lower extremity: 4+/5 proximal distal   Assessment/Plan: 1. Functional deficits which require 3+ hours per day of interdisciplinary therapy in a comprehensive inpatient rehab setting.  Physiatrist is providing  close team supervision and 24 hour management of active medical problems listed below.  Physiatrist and rehab team continue to assess barriers to discharge/monitor patient progress toward functional and medical goals   Care Tool:  Bathing  Bathing activity did not occur: Safety/medical concerns Body parts bathed by patient: Right arm,Chest,Abdomen,Front perineal area,Right upper leg,Left upper leg,Face   Body parts bathed by helper: Left arm,Buttocks,Right lower leg,Left lower leg     Bathing assist Assist Level: Moderate Assistance - Patient 50 - 74%     Upper Body Dressing/Undressing Upper body dressing Upper body dressing/undressing activity did not occur (including orthotics): Safety/medical concerns What is the patient wearing?: Pull over shirt    Upper body assist Assist Level: Maximal Assistance - Patient 25 - 49%    Lower Body Dressing/Undressing Lower body dressing    Lower body dressing activity did not occur: Safety/medical concerns What is the patient wearing?: Pants,Underwear/pull up     Lower body assist Assist for lower body dressing: Moderate Assistance - Patient 50 - 74%     Toileting Toileting    Toileting assist Assist for toileting: Minimal Assistance - Patient > 75%     Transfers Chair/bed transfer  Transfers assist  Chair/bed transfer activity did not occur: Safety/medical concerns (dizziness in standing limiting safety with mobility)  Chair/bed transfer assist level: Contact Guard/Touching assist     Locomotion Ambulation   Ambulation assist   Ambulation activity did not occur: Safety/medical concerns  Assist level: Contact Guard/Touching assist Assistive device: Cane-quad Max distance: 67ft   Walk 10 feet activity  Assist  Walk 10 feet activity did not occur: Safety/medical concerns  Assist level: Contact Guard/Touching assist Assistive device: Cane-quad   Walk 50 feet activity   Assist Walk 50 feet with 2 turns activity  did not occur: Safety/medical concerns  Assist level: Contact Guard/Touching assist Assistive device: Cane-quad    Walk 150 feet activity   Assist Walk 150 feet activity did not occur: Safety/medical concerns         Walk 10 feet on uneven surface  activity   Assist Walk 10 feet on uneven surfaces activity did not occur: Safety/medical concerns         Wheelchair     Assist Will patient use wheelchair at discharge?: No Type of Wheelchair: Manual Wheelchair activity did not occur: Safety/medical concerns (unable to transfer to w/c due to dizziness)  Wheelchair assist level: Supervision/Verbal cueing Max wheelchair distance: 150    Wheelchair 50 feet with 2 turns activity    Assist    Wheelchair 50 feet with 2 turns activity did not occur: Safety/medical concerns   Assist Level: Supervision/Verbal cueing   Wheelchair 150 feet activity     Assist  Wheelchair 150 feet activity did not occur: Safety/medical concerns   Assist Level: Supervision/Verbal cueing  BP (!) 142/72 (BP Location: Left Arm)   Pulse 69   Temp 97.6 F (36.4 C)   Resp 16   Ht 5' (1.524 m)   Wt 69.5 kg   SpO2 94%   BMI 29.92 kg/m    Medical Problem List and Plan: 1.  Deficits with mobility, transfers, ADLs secondary to polytrauma with TBI.  -Continue CIR therapies including PT, OT SLP  -ELOS 2/19---pt targeting earlier date! 2.  DVT Prophylaxis/Anticoagulation: Mechanical: Sequential compression devices, below knee Bilateral lower extremities Pharmaceutical: Lovenox.              Antiplatelets: Aspirin 81 3.  Acute on chronic pain Management:              Tylenol as needed             Robaxin 1000 mg every 8 scheduled  Pain appears fairly well controlled 4. Mood: Team support             Antipsychotic: Wellbutrin 5. Neuropsych: This patient is?  Fully capable of making decisions on her own behalf. 6. Skin/Wound Care: Routine skin care 7. Fluids/Electrolytes/Nutrition:  Monitor I/Os.             -hypokalemia;     -replaced potassium 3.1  -albumin low---protein supp  8.  Right proximal humeral fracture: Nonweightbearing per Ortho and sling. 9.  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  CTA of head/neck no arterial injury. 10.  C2-4 transverse process fracture: C-collar not required per neurosurgery 11.  Essential hypertension             Norvasc 5mg  daily  2/9 bp with ongoing elevation--increased norvasc to 10mg   2/10 bp improved today 12.  Marginal zone lymphoma             Follow-up as outpatient with Dr. Jonette Eva 13.  Acute hypoxic respiratory failure: Tracheobronchitis respiratory cultures with Pseudomonas.               IV cefepime 2/4-2/8 completed.   -remains afebrile 2/8, activity tolerance better   -encouarge OOB, IS 14.  Constipation             large bm 2/8 after sorbitol  -continue senna-s at  bedtime 15.  Hypothyroidism: Synthroid 125 daily 16.  GERD: Protonix 20 mg daily  LOS: 5 days A FACE TO FACE EVALUATION WAS PERFORMED  Amanda [name] 04/13/2020, 10:33 AM

## 2020-04-13 NOTE — Progress Notes (Signed)
Occupational Therapy Session Note  Patient Details  Name: Amanda [name] MRN: [id number] Date of Birth: 02-11-1941  Today's Date: 04/13/2020 OT Individual Time: 0737-1062 OT Individual Time Calculation (min): 57 min   Short Term Goals: Week 1:  OT Short Term Goal 1 (Week 1): Pt will don UB clothing with min A maintaining shoulder precautions OT Short Term Goal 2 (Week 1): Pt will don pants with min A with LRAD OT Short Term Goal 3 (Week 1): Pt will complete BSC transfer with CGA  Skilled Therapeutic Interventions/Progress Updates:    Pt greeted semi-reclined in bed and agreeable to OT treatment session. Pt completed bed mobility with min A to elevate trunk. Pt declined bathing/dressing but wanted to complete grooming tasks. Pt donned shoes at EOB with min A, then OT assist to don bunny arm sling for R UE. She then ambulated to the sink with SPC and CGA. Pt stood to brush hair and teeth with supervision for balance and no overt LOB. Pt able to use R hand to grasp toothbrush while applying toothpaste. Pt brought down to therapy gym and worked on dynamic balance with reaching activity while standing on foam block. OT then had pt complete pendulums in clockwise and counterclockwise  Motion while supporting self of high-low table. Pt tolerated pendulums well. Pt returned to room to go to the bathroom. Pt ambulated into bathroom with SPC and CGA. Pt needed min A to manage clothing, and voided bowel and bladder. Pt returned to wc and left seated in wc with alarm belt on and call bell in reach.   Therapy Documentation Precautions:  Precautions Precautions: Fall Precaution Comments: NWB RUE- no shoulder ROM, ok wrist and hand. Sling OOB Required Braces or Orthoses: Sling Restrictions Weight Bearing Restrictions: Yes RUE Weight Bearing: Non weight bearing Pain:  Pt reports pain in R shoulder, but no number given. Rest and repositioned for comfort.  Therapy/Group: Individual Therapy  Valma Cava 04/13/2020, 7:54 AM

## 2020-04-14 MED ORDER — CHLORHEXIDINE GLUCONATE CLOTH 2 % EX PADS
6.0000 | MEDICATED_PAD | Freq: Every day | CUTANEOUS | Status: DC
  Administered 2020-04-14: 6 via TOPICAL

## 2020-04-14 NOTE — Progress Notes (Signed)
[address] PHYSICAL MEDICINE & REHABILITATION PROGRESS NOTE  Subjective/Complaints: No new complaints. Had another good night. Right shoulder still sore.  ROS: Patient denies fever, rash, sore throat, blurred vision, nausea, vomiting, diarrhea, cough, shortness of breath or chest pain,  headache, or mood change.     Objective: Vital Signs: Blood pressure (!) 152/59, pulse 63, temperature 98.3 F (36.8 C), temperature source Oral, resp. rate 16, height 5' (1.524 m), weight 69.5 kg, SpO2 94 %. No results found. No results for input(s): WBC, HGB, HCT, PLT in the last 72 hours. No results for input(s): NA, K, CL, CO2, GLUCOSE, BUN, CREATININE, CALCIUM in the last 72 hours.  Intake/Output Summary (Last 24 hours) at 04/14/2020 1229 Last data filed at 04/14/2020 0800 Gross per 24 hour  Intake 407 ml  Output --  Net 407 ml        Physical Exam: BP (!) 152/59 (BP Location: Left Arm)   Pulse 63   Temp 98.3 F (36.8 C) (Oral)   Resp 16   Ht 5' (1.524 m)   Wt 69.5 kg   SpO2 94%   BMI 29.92 kg/m  Constitutional: No distress . Vital signs reviewed. HEENT: EOMI, oral membranes moist Neck: supple Cardiovascular: RRR without murmur. No JVD    Respiratory/Chest: CTA Bilaterally without wheezes or rales. Normal effort    GI/Abdomen: BS +, non-tender, non-distended Ext: no clubbing, cyanosis, tr RUE edema Psych: pleasant and cooperative Skin: scattered bruising right upper ext, RLE, improving Musc: Right shoulder and right hip with edema and tenderness. Neuro: Alert Motor: Right upper extremity proximally limited by sling and pain, handgrip 3/5--stable Left upper extremity: 5/5 proximal distal Right lower extremity: 3-/5 proximal to 4/5distal  Left lower extremity: 4+/5 proximal distal   Assessment/Plan: 1. Functional deficits which require 3+ hours per day of interdisciplinary therapy in a comprehensive inpatient rehab setting.  Physiatrist is providing close team supervision and  24 hour management of active medical problems listed below.  Physiatrist and rehab team continue to assess barriers to discharge/monitor patient progress toward functional and medical goals   Care Tool:  Bathing  Bathing activity did not occur: Safety/medical concerns Body parts bathed by patient: Right arm,Chest,Abdomen,Front perineal area,Right upper leg,Left upper leg,Face   Body parts bathed by helper: Left arm,Buttocks,Right lower leg,Left lower leg     Bathing assist Assist Level: Moderate Assistance - Patient 50 - 74%     Upper Body Dressing/Undressing Upper body dressing Upper body dressing/undressing activity did not occur (including orthotics): Safety/medical concerns What is the patient wearing?: Pull over shirt    Upper body assist Assist Level: Maximal Assistance - Patient 25 - 49%    Lower Body Dressing/Undressing Lower body dressing    Lower body dressing activity did not occur: Safety/medical concerns What is the patient wearing?: Pants,Underwear/pull up     Lower body assist Assist for lower body dressing: Moderate Assistance - Patient 50 - 74%     Toileting Toileting    Toileting assist Assist for toileting: Minimal Assistance - Patient > 75%     Transfers Chair/bed transfer  Transfers assist  Chair/bed transfer activity did not occur: Safety/medical concerns (dizziness in standing limiting safety with mobility)  Chair/bed transfer assist level: Supervision/Verbal cueing     Locomotion Ambulation   Ambulation assist   Ambulation activity did not occur: Safety/medical concerns  Assist level: Contact Guard/Touching assist Assistive device: Cane-quad Max distance: 150'   Walk 10 feet activity   Assist  Walk 10 feet activity  did not occur: Safety/medical concerns  Assist level: Contact Guard/Touching assist Assistive device: Cane-quad   Walk 50 feet activity   Assist Walk 50 feet with 2 turns activity did not occur: Safety/medical  concerns  Assist level: Contact Guard/Touching assist Assistive device: Cane-quad    Walk 150 feet activity   Assist Walk 150 feet activity did not occur: Safety/medical concerns    Assistive device: Cane-quad    Walk 10 feet on uneven surface  activity   Assist Walk 10 feet on uneven surfaces activity did not occur: Safety/medical concerns         Wheelchair     Assist Will patient use wheelchair at discharge?: No Type of Wheelchair: Manual Wheelchair activity did not occur: Safety/medical concerns (unable to transfer to w/c due to dizziness)  Wheelchair assist level: Supervision/Verbal cueing Max wheelchair distance: 150    Wheelchair 50 feet with 2 turns activity    Assist    Wheelchair 50 feet with 2 turns activity did not occur: Safety/medical concerns   Assist Level: Supervision/Verbal cueing   Wheelchair 150 feet activity     Assist  Wheelchair 150 feet activity did not occur: Safety/medical concerns   Assist Level: Supervision/Verbal cueing  BP (!) 152/59 (BP Location: Left Arm)   Pulse 63   Temp 98.3 F (36.8 C) (Oral)   Resp 16   Ht 5' (1.524 m)   Wt 69.5 kg   SpO2 94%   BMI 29.92 kg/m    Medical Problem List and Plan: 1.  Deficits with mobility, transfers, ADLs secondary to polytrauma with TBI.  -Continue CIR therapies including PT, OT SLP  -ELOS 2/19 2.  DVT Prophylaxis/Anticoagulation: Mechanical: Sequential compression devices, below knee Bilateral lower extremities Pharmaceutical: Lovenox.              Antiplatelets: Aspirin 81 3.  Acute on chronic pain Management:              Tylenol as needed             Robaxin 1000 mg every 8 scheduled  Pain appears fairly well controlled 2/11 4. Mood: Team support             Antipsychotic: Wellbutrin 5. Neuropsych: This patient is?  Fully capable of making decisions on her own behalf. 6. Skin/Wound Care: Routine skin care 7. Fluids/Electrolytes/Nutrition: Monitor I/Os.              -hypokalemia;     -replaced potassium 3.1  -albumin low---protein supp   -recheck labs Monday  8.  Right proximal humeral fracture: Nonweightbearing per Ortho and sling. 9.  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  CTA of head/neck no arterial injury. 10.  C2-4 transverse process fracture: C-collar not required per neurosurgery 11.  Essential hypertension             Norvasc 5mg  daily  2/9 bp with ongoing elevation--increased norvasc to 10mg   2/10-11 bp improved   12.  Marginal zone lymphoma             Follow-up as outpatient with Dr. Jonette Eva 13.  Acute hypoxic respiratory failure: Tracheobronchitis respiratory cultures with Pseudomonas.               IV cefepime 2/4-2/8 completed.   -remains afebrile 2/8, activity tolerance better   -encouarge OOB, IS 14.  Constipation             large bm 2/8 after sorbitol  -continue senna-s at  bedtime 15.  Hypothyroidism: Synthroid 125 daily 16.  GERD: Protonix 20 mg daily  LOS: 6 days A FACE TO FACE EVALUATION WAS PERFORMED  Meredith Staggers 04/14/2020, 12:29 PM

## 2020-04-14 NOTE — Progress Notes (Signed)
Occupational Therapy Session Note  Patient Details  Name: Amanda [name] MRN: [id number] Date of Birth: Sep 17, 1940  Today's Date: 04/14/2020 OT Individual Time: 1530-1555 OT Individual Time Calculation (min): 25 min    Short Term Goals: Week 1:  OT Short Term Goal 1 (Week 1): Pt will don UB clothing with min A maintaining shoulder precautions OT Short Term Goal 2 (Week 1): Pt will don pants with min A with LRAD OT Short Term Goal 3 (Week 1): Pt will complete BSC transfer with CGA  Skilled Therapeutic Interventions/Progress Updates:    pt received in w/c agreeable to OT with tailbone pain that is "bearable." decliens medication. Pt completes functional transfers in ADL apartment into bathroom and in living room to couch with CGA. Edu to pt and husband who arrives midway through session to have pt sit on L side of couch to have armrest to push off of. Verbalized understanidng. Pt ambulates part way to room before reporting dizziness/vestibular symptoms. Pt declines zofran. Exited session with pt seated in bed, exit alarm on and call light in reach   Therapy Documentation Precautions:  Precautions Precautions: Fall Precaution Comments: NWB RUE- no shoulder ROM, ok wrist and hand. Sling OOB Required Braces or Orthoses: Sling Restrictions Weight Bearing Restrictions: Yes RUE Weight Bearing: Non weight bearing General:   Vital Signs: Therapy Vitals Temp: 98.3 F (36.8 C) Temp Source: Oral Pulse Rate: 63 Resp: 16 BP: (!) 152/59 Patient Position (if appropriate): Lying Oxygen Therapy SpO2: 94 % O2 Device: Room Air Pain:   ADL: ADL Eating: Modified independent Where Assessed-Eating: Edge of bed Grooming: Supervision/safety Where Assessed-Grooming: Edge of bed Upper Body Bathing: Unable to assess Lower Body Bathing: Unable to assess Upper Body Dressing: Unable to assess Lower Body Dressing: Unable to assess Toileting: Moderate assistance Where Assessed-Toileting: Bedside  Commode Toilet Transfer: Minimal assistance Toilet Transfer Method: Stand pivot Toilet Transfer Equipment: Bedside commode Tub/Shower Transfer Method: Unable to assess Social research officer, government: Unable to assess [employer] Transfer Method: Unable to assess Vision   Perception    Praxis   Exercises:   Other Treatments:     Therapy/Group: Individual Therapy  Tonny Branch 04/14/2020, 6:52 AM

## 2020-04-14 NOTE — Progress Notes (Signed)
Occupational Therapy Session Note  Patient Details  Name: Amanda [name] MRN: [id number] Date of Birth: [birth year]  Today's Date: 04/14/2020 OT Individual Time: 8301-4159 OT Individual Time Calculation (min): 57 min   Short Term Goals: Week 1:  OT Short Term Goal 1 (Week 1): Pt will don UB clothing with min A maintaining shoulder precautions OT Short Term Goal 2 (Week 1): Pt will don pants with min A with LRAD OT Short Term Goal 3 (Week 1): Pt will complete BSC transfer with CGA  Skilled Therapeutic Interventions/Progress Updates:    Pt greeted semi-reclined in bed and agreeable to OT treatment session. Pt wanted to shower today, but stated she had just returned from the bathroom. Pt completed bed mobility from flat bed with min A to elevate trunk. Sit<>stand from EOB with quad cane and min A. Pt ambulated into bathroom with CGA. She sat on shower seat to doff clothing with min cues for safety and min A to get pants off. Min A to doff shirt 2/2 painful R UE. Bathing completed with min A to get LEs and CGA for balance when standing to wash buttocks. Educated on dressing strategies with pt able to thread pants today, but needed OT assist to pull them up with one hand. Educated on donning button up shirt with pt needing min A to pull shirt around back. She was then able to button shirt using one hand. Pt completed grooming tasks at the sink and was able to use R hand to grasp items and stabilize them. Worked on standing balance/endurance while OT assisted with drying hair. Pt tolerated standing for 2 minutes, then reported feeling nauseous, but no emesis. Pt ambulated back to bed at end of session with CGA and left semi-reclined in bed with bed alarm on, call bell in reach, and needs met.   Therapy Documentation Precautions:  Precautions Precautions: Fall Precaution Comments: NWB RUE- no shoulder ROM, ok wrist and hand. Sling OOB Required Braces or Orthoses: Sling Restrictions Weight Bearing  Restrictions: Yes RUE Weight Bearing: Non weight bearing Pain: Pain Assessment Pain Scale: 0-10 Pain Score: 0-No pain   Therapy/Group: Individual Therapy  Valma Cava 04/14/2020, 1:57 PM

## 2020-04-14 NOTE — Progress Notes (Signed)
Physical Therapy Session Note  Patient Details  Name: Amanda [name] MRN: [id number] Date of Birth: 23-Apr-1940  Today's Date: 04/14/2020 PT Individual Time: [phone] and 1434-1530 PT Individual Time Calculation (min): 43 min and 56 min  Short Term Goals: Week 1:  PT Short Term Goal 1 (Week 1): Patient will performe bed mobility with CGA while maintain R UE NWB without cues. PT Short Term Goal 2 (Week 1): Patient will perform basic transfers with CGA for safety/balance using LRAD. PT Short Term Goal 3 (Week 1): Patient will ambulate >50 ft with CGA for safety/balance. PT Short Term Goal 4 (Week 1): Patient will initiate stair training.  Skilled Therapeutic Interventions/Progress Updates:      1st Session: Pt received supine in bed and agrees to therapy. No complaint of pain. Supine to sit with verbal cues on body mechanics. Stand pivot transfer to [facility] with CGA. WC transport to gym for time management. Pt ambulates x150' with CGA and cues for upright gaze to improve posture and balance, and increasing gait speed and stride length to decrease risk for falls. Following extended seated rest break, pt performs TUG x3 reps with following times: 24.0", 20.2", and 19.6". Average score =21.3". PT educates pt on rationale for TUG and indicators for risk of falls. Pt then performs repeated sit to stand transfers for functional strengthening and body mechanics training. PT cues for anterior weight shift and increased eccentric control while transitioning form stand to sit. Pt performs x10 total with supervision. Stand pivot transfer from mat>WC>bed with verbal cues. Left supine in bed with alarm intact and all needs within reach.  2nd Session: Pt received supine in bed and agrees to therapy. No complaint of pain. Supine to sit with verbal cues and increased time. Sit to stand and stand pivot to [facility] with CGA. WC transport outside for gait training over varying surfaces and unlevel ground. Pt ambulates bouts of  110' x2 and 150' with extended seated rest breaks in between. PT cues pt for pursed lip breathing to optimize oxygen sats. During bouts of ambulation, pt requires CGA and PT cues for increased stride length. Pt takes very tentative, short strides, when not cued, but is able to increase length with cueing. Pt states that she feels "unsteady" when taking longer strides. WC transport back to room. Pt left seated in WC with all needs within reach.  Therapy Documentation Precautions:  Precautions Precautions: Fall Precaution Comments: NWB RUE- no shoulder ROM, ok wrist and hand. Sling OOB Required Braces or Orthoses: Sling Restrictions Weight Bearing Restrictions: Yes RUE Weight Bearing: Non weight bearing   Therapy/Group: Individual Therapy  Breck Coons, PT, DPT 04/14/2020, 3:51 PM

## 2020-04-15 NOTE — Progress Notes (Signed)
Occupational Therapy Session Note  Patient Details  Name: BONNITA NEWBY MRN: [id number] Date of Birth: [birth year]  Today's Date: 04/15/2020 OT Group Time: 1100-1200 OT Group Time Calculation (min): 60 min  Skilled Therapeutic Interventions/Progress Updates:    Pt engaged in therapeutic w/c level dance group focusing on patient choice, UE/LE strengthening, salience, activity tolerance, and social participation. Pt was guided through various dance-based exercises involving UEs/LEs and trunk. All music was selected by group members. Emphasis placed on standing balance and strengthening of Lt UE and B LEs. Pt was mindful of her Rt UE NWB precautions as well as her inability to mobilize the Rt shoulder. She was wearing the sling throughout group, did incorporate movements of wrist and hand for clapping but overall just used Lt UE during UB exercises. Pt stood dynamically with RT given CGA while listening to [place]. She was returned to room by OT at end of tx.    Therapy Documentation Precautions:  Precautions Precautions: Fall Precaution Comments: NWB RUE- no shoulder ROM, ok wrist and hand. Sling OOB Required Braces or Orthoses: Sling Restrictions Weight Bearing Restrictions: Yes RUE Weight Bearing: Non weight bearing Pain: no s/s pain during session Pain Assessment Pain Scale: 0-10 Pain Score: 0-No pain ADL: ADL Eating: Modified independent Where Assessed-Eating: Edge of bed Grooming: Supervision/safety Where Assessed-Grooming: Edge of bed Upper Body Bathing: Unable to assess Lower Body Bathing: Unable to assess Upper Body Dressing: Unable to assess Lower Body Dressing: Unable to assess Toileting: Moderate assistance Where Assessed-Toileting: Bedside Commode Toilet Transfer: Minimal assistance Toilet Transfer Method: Stand pivot Toilet Transfer Equipment: Bedside commode Tub/Shower Transfer Method: Unable to assess Gaffer Transfer: Unable to assess [employer] Method: Unable to assess      Therapy/Group: Group Therapy  Gemini Bunte A Lakindra Wible 04/15/2020, 12:42 PM

## 2020-04-15 NOTE — Progress Notes (Signed)
Physical Therapy Session Note  Patient Details  Name: Amanda [name] MRN: [id number] Date of Birth: 01-21-1941  Today's Date: 04/15/2020 PT Individual Time: 1702-1730 PT Individual Time Calculation (min): 28 min   Short Term Goals: Week 1:  PT Short Term Goal 1 (Week 1): Patient will performe bed mobility with CGA while maintain R UE NWB without cues. PT Short Term Goal 2 (Week 1): Patient will perform basic transfers with CGA for safety/balance using LRAD. PT Short Term Goal 3 (Week 1): Patient will ambulate >50 ft with CGA for safety/balance. PT Short Term Goal 4 (Week 1): Patient will initiate stair training.  Skilled Therapeutic Interventions/Progress Updates:   Pt received sitting in WC and agreeable to PT. Pt transported to rehab gym in [facility]. Gait training with no AD and supervision assist x 190f and 334f2. Cues for decreased speed in turns as well as improved pursed lip breathing.   Nustep reciprocal cardiovascular endurance training with BLE only x 5 min +2 min with prolonged rest break between bouts. Pt rates BORG RPE 17/20.   [facility] and all needs met.         Therapy Documentation Precautions:  Precautions Precautions: Fall Precaution Comments: NWB RUE- no shoulder ROM, ok wrist and hand. Sling OOB Required Braces or Orthoses: Sling Restrictions Weight Bearing Restrictions: Yes RUE Weight Bearing: Non weight bearing    Vital Signs: Therapy Vitals Temp: 97.7 F (36.5 C) Pulse Rate: 80 Resp: 15 BP: 121/60 Patient Position (if appropriate): Lying Oxygen Therapy SpO2: 97 % O2 Device: Room Air Pain: denies   Therapy/Group: Individual Therapy  AuLorie Phenix/02/2021, 5:30 PM

## 2020-04-15 NOTE — Progress Notes (Signed)
[place] PHYSICAL MEDICINE & REHABILITATION PROGRESS NOTE  Subjective/Complaints: Pt doing well. Had another good night of sleep.   ROS: Patient denies fever, rash, sore throat, blurred vision, nausea, vomiting, diarrhea, cough, shortness of breath or chest pain,   headache, or mood change.    Objective: Vital Signs: Blood pressure 135/71, pulse 62, temperature 98.7 F (37.1 C), resp. rate 18, height 5' (1.524 m), weight 69.5 kg, SpO2 93 %. No results found. No results for input(s): WBC, HGB, HCT, PLT in the last 72 hours. No results for input(s): NA, K, CL, CO2, GLUCOSE, BUN, CREATININE, CALCIUM in the last 72 hours.  Intake/Output Summary (Last 24 hours) at 04/15/2020 1319 Last data filed at 04/15/2020 0900 Gross per 24 hour  Intake 504 ml  Output --  Net 504 ml        Physical Exam: BP 135/71 (BP Location: Left Arm)   Pulse 62   Temp 98.7 F (37.1 C)   Resp 18   Ht 5' (1.524 m)   Wt 69.5 kg   SpO2 93%   BMI 29.92 kg/m  Constitutional: No distress . Vital signs reviewed. HEENT: EOMI, oral membranes moist Neck: supple Cardiovascular: RRR without murmur. No JVD    Respiratory/Chest: CTA Bilaterally without wheezes or rales. Normal effort    GI/Abdomen: BS +, non-tender, non-distended Ext: no clubbing, cyanosis, or edema Psych: pleasant and cooperative Skin: scattered bruising right upper ext, RLE, improving Musc: Right shoulder and right hip with edema and tenderness. Neuro: Alert Motor: Right upper extremity proximally limited by sling and pain, handgrip 3/5--stable Left upper extremity: 5/5 proximal distal Right lower extremity: 3-/5 proximal to 4/5distal  Left lower extremity: 4+/5 proximal distal   Assessment/Plan: 1. Functional deficits which require 3+ hours per day of interdisciplinary therapy in a comprehensive inpatient rehab setting.  Physiatrist is providing close team supervision and 24 hour management of active medical problems listed  below.  Physiatrist and rehab team continue to assess barriers to discharge/monitor patient progress toward functional and medical goals   Care Tool:  Bathing  Bathing activity did not occur: Safety/medical concerns Body parts bathed by patient: Right arm,Chest,Abdomen,Front perineal area,Right upper leg,Left upper leg,Face   Body parts bathed by helper: Left arm,Buttocks,Right lower leg,Left lower leg     Bathing assist Assist Level: Moderate Assistance - Patient 50 - 74%     Upper Body Dressing/Undressing Upper body dressing Upper body dressing/undressing activity did not occur (including orthotics): Safety/medical concerns What is the patient wearing?: Pull over shirt    Upper body assist Assist Level: Maximal Assistance - Patient 25 - 49%    Lower Body Dressing/Undressing Lower body dressing    Lower body dressing activity did not occur: Safety/medical concerns What is the patient wearing?: Pants,Underwear/pull up     Lower body assist Assist for lower body dressing: Moderate Assistance - Patient 50 - 74%     Toileting Toileting    Toileting assist Assist for toileting: Minimal Assistance - Patient > 75%     Transfers Chair/bed transfer  Transfers assist  Chair/bed transfer activity did not occur: Safety/medical concerns (dizziness in standing limiting safety with mobility)  Chair/bed transfer assist level: Supervision/Verbal cueing     Locomotion Ambulation   Ambulation assist   Ambulation activity did not occur: Safety/medical concerns  Assist level: Contact Guard/Touching assist Assistive device: No Device Max distance: 150'   Walk 10 feet activity   Assist  Walk 10 feet activity did not occur: Safety/medical concerns  Assist  level: Contact Guard/Touching assist Assistive device: No Device   Walk 50 feet activity   Assist Walk 50 feet with 2 turns activity did not occur: Safety/medical concerns  Assist level: Contact Guard/Touching  assist Assistive device: No Device    Walk 150 feet activity   Assist Walk 150 feet activity did not occur: Safety/medical concerns  Assist level: Contact Guard/Touching assist Assistive device: No Device    Walk 10 feet on uneven surface  activity   Assist Walk 10 feet on uneven surfaces activity did not occur: Safety/medical concerns         Wheelchair     Assist Will patient use wheelchair at discharge?: No Type of Wheelchair: Manual Wheelchair activity did not occur: Safety/medical concerns (unable to transfer to w/c due to dizziness)  Wheelchair assist level: Supervision/Verbal cueing Max wheelchair distance: 150    Wheelchair 50 feet with 2 turns activity    Assist    Wheelchair 50 feet with 2 turns activity did not occur: Safety/medical concerns   Assist Level: Supervision/Verbal cueing   Wheelchair 150 feet activity     Assist  Wheelchair 150 feet activity did not occur: Safety/medical concerns   Assist Level: Supervision/Verbal cueing  BP 135/71 (BP Location: Left Arm)   Pulse 62   Temp 98.7 F (37.1 C)   Resp 18   Ht 5' (1.524 m)   Wt 69.5 kg   SpO2 93%   BMI 29.92 kg/m    Medical Problem List and Plan: 1.  Deficits with mobility, transfers, ADLs secondary to polytrauma with TBI. RLAS VII  -Continue CIR therapies including PT, OT SLP  -ELOS 2/19 2.  DVT Prophylaxis/Anticoagulation: Mechanical: Sequential compression devices, below knee Bilateral lower extremities Pharmaceutical: Lovenox.              Antiplatelets: Aspirin 81 3.  Acute on chronic pain Management:              Tylenol as needed             Robaxin 1000 mg every 8 scheduled  Pain appears fairly well controlled 2/11 4. Mood: Team support             Antipsychotic: Wellbutrin 5. Neuropsych: This patient is?  Fully capable of making decisions on her own behalf. 6. Skin/Wound Care: Routine skin care 7. Fluids/Electrolytes/Nutrition: Monitor I/Os.              -hypokalemia;     -replaced potassium 3.1  -albumin low---protein supp   -recheck labs Monday  8.  Right proximal humeral fracture: Nonweightbearing per Ortho and sling.  -Beginning Codman's exercises this past week. Will check with ortho prior to discharge about advancing her any further 9.  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  CTA of head/neck no arterial injury. 10.  C2-4 transverse process fracture: C-collar not required per neurosurgery 11.  Essential hypertension             Norvasc 5mg  daily  2/9 bp with ongoing elevation--increased norvasc to 10mg   2/10-12 bp improved   12.  Marginal zone lymphoma             Follow-up as outpatient with Dr. Jonette Eva 13.  Acute hypoxic respiratory failure: Tracheobronchitis respiratory cultures with Pseudomonas.               IV cefepime 2/4-2/8 completed.   -remains afebrile 2/8, activity tolerance better   -encouarge OOB, IS 14.  Constipation  large bm 2/8 after sorbitol  -continue senna-s at bedtime 15.  Hypothyroidism: Synthroid 125 daily 16.  GERD: Protonix 20 mg daily  LOS: 7 days A FACE TO FACE EVALUATION WAS PERFORMED  Meredith Staggers 04/15/2020, 1:19 PM

## 2020-04-15 NOTE — Progress Notes (Signed)
Physical Therapy Session Note  Patient Details  Name: Amanda [name] MRN: [id number] Date of Birth: Mar 18, 1940  Today's Date: 04/15/2020 PT Individual Time: 9977-4142 PT Individual Time Calculation (min): 45 min   Short Term Goals: Week 1:  PT Short Term Goal 1 (Week 1): Patient will performe bed mobility with CGA while maintain R UE NWB without cues. PT Short Term Goal 2 (Week 1): Patient will perform basic transfers with CGA for safety/balance using LRAD. PT Short Term Goal 3 (Week 1): Patient will ambulate >50 ft with CGA for safety/balance. PT Short Term Goal 4 (Week 1): Patient will initiate stair training.  Skilled Therapeutic Interventions/Progress Updates:   Pt received supine in bed and agreeable to PT. Supine>sit transfer with supervision assist and heavy use of arm rail on the L. PT assisted pt to to don RUE sling Stand pivot transfer to [facility] with supervision assist.  Oral hygiene and hair care at the sink with set up assist while sitting in WC.   Pt transported to day room. Gait training without AD and supervision assist from PT for safety x 170f. Cues for increased step length intermittently.   Dynamic standing balance while engaged in wii bowling x 143fms on airex pad. Supervision assist-min assist intermittently due to posterior LOB, able to correct 75% of time with cues for use of ankle strategy.   Pt returned to room and performed stand pivot transfer to bed with supervision assist and no AD. Sit>supine completed with supervision assist and cues for positioning and safety of the RUE. Pt left supine in bed with call bell in reach and all needs met.         Therapy Documentation Precautions:  Precautions Precautions: Fall Precaution Comments: NWB RUE- no shoulder ROM, ok wrist and hand. Sling OOB Required Braces or Orthoses: Sling Restrictions Weight Bearing Restrictions: Yes RUE Weight Bearing: Non weight bearing Vital Signs:   SPO2> 96% throughout session   Pain: Denies at rest. 3/10 mid back with movement, pt repositioned.    Therapy/Group: Individual Therapy  AuLorie Phenix/02/2021, 9:50 AM

## 2020-04-17 LAB — BASIC METABOLIC PANEL
Anion gap: 10 (ref 5–15)
BUN: 12 mg/dL (ref 8–23)
CO2: 24 mmol/L (ref 22–32)
Calcium: 8.6 mg/dL — ABNORMAL LOW (ref 8.9–10.3)
Chloride: 105 mmol/L (ref 98–111)
Creatinine, Ser: 0.72 mg/dL (ref 0.44–1.00)
GFR, Estimated: >60 mL/min (ref >60)
Glucose, Bld: 106 mg/dL — ABNORMAL HIGH (ref 70–99)
Potassium: 3.8 mmol/L (ref 3.5–5.1)
Sodium: 139 mmol/L (ref 135–145)

## 2020-04-17 LAB — CBC
HCT: 33.9 % — ABNORMAL LOW (ref 36.0–46.0)
Hemoglobin: 10.7 g/dL — ABNORMAL LOW (ref 12.0–15.0)
MCH: 27.9 pg (ref 26.0–34.0)
MCHC: 31.6 g/dL (ref 30.0–36.0)
MCV: 88.5 fL (ref 80.0–100.0)
Platelets: 508 10*3/uL — ABNORMAL HIGH (ref 150–400)
RBC: 3.83 MIL/uL — ABNORMAL LOW (ref 3.87–5.11)
RDW: 18.9 % — ABNORMAL HIGH (ref 11.5–15.5)
WBC: 5.3 10*3/uL (ref 4.0–10.5)
nRBC: 0 % (ref 0.0–0.2)

## 2020-04-17 NOTE — Progress Notes (Signed)
Slept well throughout the night. No prns given. Pt reports no issues throughout the night. Toileted by staff.

## 2020-04-17 NOTE — Progress Notes (Addendum)
Occupational Therapy Session Note  Patient Details  Name: Amanda [name] MRN: [id number] Date of Birth: 1941/01/25  Today's Date: 04/17/2020 OT Individual Time: 1132-1200 OT Individual Time Calculation (min): 28 min    Short Term Goals: Week 1:  OT Short Term Goal 1 (Week 1): Pt will don UB clothing with min A maintaining shoulder precautions OT Short Term Goal 1 - Progress (Week 1): Met OT Short Term Goal 2 (Week 1): Pt will don pants with min A with LRAD OT Short Term Goal 2 - Progress (Week 1): Met OT Short Term Goal 3 (Week 1): Pt will complete BSC transfer with CGA OT Short Term Goal 3 - Progress (Week 1): Met Week 2:  OT Short Term Goal 1 (Week 2): LTG-STG 2.2 ELOS   Skilled Therapeutic Interventions/Progress Updates:    Pt greeted at time of session sitting up in wheelchair, pleasant and agreeable to OT session. Pt had already performed ADL this am. Pt expressed she wanted to work on mobility and walking. Transported via wheelchair to hall and walked from gym entrance [place] hall approx 150 feet, note that safety with cane decreased with fatigue. Pt stated she had been doing pendulum swings but had not performed in a while and wanting to complete, performed briefly in standing with no pain reported and cues for technique, very small ROM. Transported back to room dependent for time, agreeable to sit up for lunch and encouraged to call nursing when ready to go back to bed. Alarm on call bell in reach.   Therapy Documentation Precautions:  Precautions Precautions: Fall Precaution Comments: NWB RUE- no shoulder ROM, ok wrist and hand. Sling OOB Required Braces or Orthoses: Sling Restrictions Weight Bearing Restrictions: Yes RUE Weight Bearing: Non weight bearing     Therapy/Group: Individual Therapy  Viona Gilmore 04/17/2020, 12:24 PM

## 2020-04-17 NOTE — Progress Notes (Signed)
Occupational Therapy Weekly Progress Note  Patient Details  Name: Amanda [name] MRN: [id number] Date of Birth: 05/25/1940  Beginning of progress report period: April 09, 2020 End of progress report period: April 17, 2020  Today's Date: 04/17/2020 OT Individual Time: 8347-5830 OT Individual Time Calculation (min): 60 min  Patient has met 3 of 3 short term goals.  Pt is making great progress towards OT goals. Pt is at an overall CGA level for BADL tasks and functional ambulation. She continues to be limited by pain and ROM restrictions to R UE, but that has greatly improved. Continue current POC.  Patient continues to demonstrate the following deficits: muscle weakness, impaired timing and sequencing, abnormal tone, unbalanced muscle activation, motor apraxia, ataxia, decreased coordination and decreased motor planning, decreased initiation, decreased attention, decreased awareness, decreased problem solving, decreased safety awareness, decreased memory and delayed processing and decreased sitting balance, decreased standing balance, decreased postural control, hemiplegia and decreased balance strategies and therefore will continue to benefit from skilled OT intervention to enhance overall performance with BADL and Reduce care partner burden.  Patient progressing toward long term goals..  Continue plan of care.  OT Short Term Goals Week 2:  OT Short Term Goal 1 (Week 2): LTG-STG 2.2 ELOS  Skilled Therapeutic Interventions/Progress Updates:    Pt greeted seated in wc after finishing breakfast and agreeable to OT treatment session focused on self-care retraining. Pt with questions regarding ROM restrictions of R UE. OT reviewed no ranging of elbow or shoulder. Educated on ways to access underarms to wash and don deodorant by leaning. Pt then ambulated to the shower with quad cane and CGA. Bathing completed with overall CGA and set-up. Educated further on dressing strategies to help pull pants  up with one hand, but pt still needed min A. May trial AE next session. Pt also needed min A to get shirt overhead but was able to thread R UE today. OT educated on one handed technique for donning socks with pt demonstrating understanding in figure 4 position. Pt returned to bed at end of session with CGA and left semi-reclined with bed alarm on, call bell in reach, and needs met.   Therapy Documentation Precautions:  Precautions Precautions: Fall Precaution Comments: NWB RUE- no shoulder ROM, ok wrist and hand. Sling OOB Required Braces or Orthoses: Sling Restrictions Weight Bearing Restrictions: Yes RUE Weight Bearing: Non weight bearing Pain: Pain Assessment Pain Scale: 0-10 Pain Score: 6  Pain Type: Acute pain Pain Location: Back   Therapy/Group: Individual Therapy  Valma Cava 04/17/2020, 8:31 AM

## 2020-04-17 NOTE — Progress Notes (Signed)
Physical Therapy Weekly Progress Note  Patient Details  Name: Amanda [name] MRN: [id number] Date of Birth: 11/03/1940  Beginning of progress report period: April 09, 2020 End of progress report period: April 17, 2020  Today's Date: 04/17/2020 PT Individual Time: 1348-1430 PT Individual Time Calculation (min): 42 min   Patient has met 4 of 4 short term goals.  Pt is progressing very well toward all mobility goals, improving independence with bed mobility, transfers, ambulation, balance, and stair training. Pt performing all mobility as supervision to CGA level, with PT providing cues primarily to promote improved posture and gait pattern to decrease risk for falls. Pt continues to ambulates very slowly, with short bilateral stride lengths, and with forward flexed posture and decreased trunk rotation, all indicating pt is at risk for falls. Focus of coming week to be strengthening, balance training, and postural re-ed, as well as prep for DC.   Patient continues to demonstrate the following deficits muscle weakness, decreased cardiorespiratoy endurance and decreased standing balance, decreased postural control and decreased balance strategies and therefore will continue to benefit from skilled PT intervention to increase functional independence with mobility.  Patient progressing toward long term goals..  Continue plan of care.  PT Short Term Goals Week 1:  PT Short Term Goal 1 (Week 1): Patient will performe bed mobility with CGA while maintain R UE NWB without cues. PT Short Term Goal 1 - Progress (Week 1): Met PT Short Term Goal 2 (Week 1): Patient will perform basic transfers with CGA for safety/balance using LRAD. PT Short Term Goal 2 - Progress (Week 1): Met PT Short Term Goal 3 (Week 1): Patient will ambulate >50 ft with CGA for safety/balance. PT Short Term Goal 3 - Progress (Week 1): Met PT Short Term Goal 4 (Week 1): Patient will initiate stair training. PT Short Term Goal 4 -  Progress (Week 1): Met Week 2:  PT Short Term Goal 1 (Week 2): STGs=LTGs  Skilled Therapeutic Interventions/Progress Updates:  Ambulation/gait training;Cognitive remediation/compensation;Discharge planning;DME/adaptive equipment instruction;Functional mobility training;Pain management;Psychosocial support;Splinting/orthotics;Therapeutic Activities;UE/LE Strength taining/ROM;Wheelchair propulsion/positioning;UE/LE Coordination activities;Therapeutic Exercise;Stair training;Skin care/wound management;Patient/family education;Neuromuscular re-education;Functional electrical stimulation;Disease management/prevention;Community reintegration;Balance/vestibular training   Pt received supine in bed and agrees to therapy. Supine to sit with supervision and cues on positioning at EOB. Pt performs stand pivot transfer to WC with CGA. WC transport to gym for time management. Pt performs multiple gait trials during session. Initially pt ambulates x100' with quad cane, progressing to 2x100' with single point cane. PT cues pt for upright gaze to improve posture and balance and increasing bilateral stride length and speed to decrease risk for fall. Pt uses floor tiles as external markers to gauge stride lengths. Pt ambulates up/down 4 6" steps with L hand rail and CGA. Following, pt performs 3x10 bilateral high knee marches in parallel bars, facing mirror and with cues to tap thigh to bar on each rep. Performs for strengthening and balance training. Pt verbalizes SOB between bouts. O2 sats measures at 98% and HR 88 BPM.  Stand step transfer and sit to supine with cues on positioning. Left supine in bed with alarm intact and all needs within reach.  Therapy Documentation Precautions:  Precautions Precautions: Fall Precaution Comments: NWB RUE- no shoulder ROM, ok wrist and hand. Sling OOB Required Braces or Orthoses: Sling Restrictions Weight Bearing Restrictions: Yes RUE Weight Bearing: Non weight  bearing   Therapy/Group: Individual Therapy  Breck Coons, PT, DPT 04/17/2020, 3:36 PM

## 2020-04-17 NOTE — Progress Notes (Signed)
Physical Therapy Session Note  Patient Details  Name: Amanda [name] MRN: [id number] Date of Birth: Dec 06, 1940  Today's Date: 04/17/2020 PT Individual Time: 1300-1330 PT Individual Time Calculation (min): 30 min   Short Term Goals: Week 1:  PT Short Term Goal 1 (Week 1): Patient will performe bed mobility with CGA while maintain R UE NWB without cues. PT Short Term Goal 1 - Progress (Week 1): Met PT Short Term Goal 2 (Week 1): Patient will perform basic transfers with CGA for safety/balance using LRAD. PT Short Term Goal 2 - Progress (Week 1): Met PT Short Term Goal 3 (Week 1): Patient will ambulate >50 ft with CGA for safety/balance. PT Short Term Goal 3 - Progress (Week 1): Met PT Short Term Goal 4 (Week 1): Patient will initiate stair training. PT Short Term Goal 4 - Progress (Week 1): Met Week 2:  PT Short Term Goal 1 (Week 2): STGs=LTGs  Skilled Therapeutic Interventions/Progress Updates:  Patient supine in bed upon PT arrival. Patient alert and agreeable to PT session. Patient denied pain during session.  Therapeutic Activity: Bed Mobility: Patient performed supine --> sit with light Min A for UB after extra time and effort. Provided verbal cues for technique and push with LUE. On return to supine, pt is able to perform with supervision and no vc required.  Transfers: Patient performed STS and SPVT transfers throughout session to various surfaces and heights with supervision/ CGA.  Provided verbal cues for midline orienting/ equal weight bearing over BLE despite NWB to RUE.  Gait Training:  At start of session, pt states that she most needs to work on her endurance as she still feels weak. Pt taken to furthest therapy gym from room and informed that therapy session will be to work way back to room while focusing on energy conservation in order to ambulate furthest distance and complete most tasks. Pt in agreement. Pt ambulated total of 403 feet using small base quad cane with  supervision overall and intermittent CGA for balance. Pt is able to ambulate up/ down ramp, around obstacles, up/ down 4 steps, and step over 2 inch obstacles requiring no seated or standing rest breaks until the end of the bout. Following this, she is able to ambulate 130' back to room with supervision. Provided verbal cues for cane mgmt and sequencing, step height/ length. Pt very pleased with performance at end of session and informs husband who is in room.   Patient supine in bed at end of session with brakes locked, bed alarm set, and all needs within reach.  Therapy Documentation Precautions:  Precautions Precautions: Fall Precaution Comments: NWB RUE- no shoulder ROM, ok wrist and hand. Sling OOB Required Braces or Orthoses: Sling Restrictions Weight Bearing Restrictions: Yes RUE Weight Bearing: Non weight bearing    Therapy/Group: Individual Therapy  Amanda [name] 04/17/2020, 5:26 PM

## 2020-04-18 ENCOUNTER — Inpatient Hospital Stay (HOSPITAL_COMMUNITY): Payer: Medicare Other

## 2020-04-18 DIAGNOSIS — E876 Hypokalemia: Secondary | ICD-10-CM

## 2020-04-18 DIAGNOSIS — S069X2S Unspecified intracranial injury with loss of consciousness of 31 minutes to 59 minutes, sequela: Secondary | ICD-10-CM

## 2020-04-18 NOTE — Plan of Care (Signed)
  Problem: Consults Goal: RH BRAIN INJURY PATIENT EDUCATION Description: Description: See Patient Education module for eduction specifics Outcome: Progressing   Problem: RH SKIN INTEGRITY Goal: RH STG SKIN FREE OF INFECTION/BREAKDOWN Description: Skin to remain free from infection and breakdown with Mod I assistance.  Outcome: Progressing Goal: RH STG MAINTAIN SKIN INTEGRITY WITH ASSISTANCE Description: STG Maintain Skin Integrity With Mod I Assistance. Outcome: Progressing   Problem: RH SAFETY Goal: RH STG ADHERE TO SAFETY PRECAUTIONS W/ASSISTANCE/DEVICE Description: STG Adhere to Safety Precautions With Mod I Assistance/Device. Outcome: Progressing Goal: RH STG DECREASED RISK OF FALL WITH ASSISTANCE Description: STG Decreased Risk of Fall With Mod I Assistance. Outcome: Progressing   Problem: RH COGNITION-NURSING Goal: RH STG USES MEMORY AIDS/STRATEGIES W/ASSIST TO PROBLEM SOLVE Description: STG Uses Memory Aids/Strategies With Mod I Assistance to Problem Solve. Outcome: Progressing   Problem: RH PAIN MANAGEMENT Goal: RH STG PAIN MANAGED AT OR BELOW PT'S PAIN GOAL Description: Pain scale <4/10 Outcome: Progressing   Problem: RH KNOWLEDGE DEFICIT BRAIN INJURY Goal: RH STG INCREASE KNOWLEDGE OF SELF CARE AFTER BRAIN INJURY Description: Patient will demonstrate knowledge of medication management, weight bearing precautions, and pain management strategies with educational materials and handouts provided by staff. Outcome: Progressing

## 2020-04-18 NOTE — Progress Notes (Signed)
Occupational Therapy Session Note  Patient Details  Name: Amanda [name] MRN: [id number] Date of Birth: 1940/08/07  Today's Date: 04/18/2020 OT Individual Time: 0750-0830 OT Individual Time Calculation (min): 40 min    Short Term Goals: Week 2:  OT Short Term Goal 1 (Week 2): LTG-STG 2.2 ELOS  Skilled Therapeutic Interventions/Progress Updates:    Pt greeted seated in wc having just received breakfast tray. Pt requests OT to return in 20 mins. OT returned and pt ready for therapy session. Pt ambulated to bathroom with [facility] and close supervision. Pt able to manage clothing and void with set-up for peri-care. Pt donned clean pants on commode with CGA to pull pants up over hips. Pt ambulated back out of bathroom to wc and completed UB bathing tasks. Educated on modifed strategy to pull shirt overhead after threading R UE, with this technique being more successful and pulling less on her painful R shoulder. Pt finished grooming tasks at the sink with supervision. Pt returned to bed at end of session with supervision and left semi-reclined with bed alarm on, call bell in reach, and needs met.    Therapy Documentation Precautions:  Precautions Precautions: Fall Precaution Comments: NWB RUE- no shoulder ROM, ok wrist and hand. Sling OOB Required Braces or Orthoses: Sling Restrictions Weight Bearing Restrictions: Yes RUE Weight Bearing: Non weight bearing General: General OT Amount of Missed Time: 20 Minutes Pain:   denies pain  Therapy/Group: Individual Therapy  Valma Cava 04/18/2020, 3:51 PM

## 2020-04-18 NOTE — Progress Notes (Signed)
Physical Therapy Session Note  Patient Details  Name: Amanda [name] MRN: [id number] Date of Birth: 1940-10-26  Today's Date: 04/18/2020 PT Individual Time: 0932-3557 PT Individual Time Calculation (min): 42 min   Short Term Goals: Week 2:  PT Short Term Goal 1 (Week 2): STGs=LTGs  Skilled Therapeutic Interventions/Progress Updates:     Pt received supine in bed and agrees to therapy. Supine to sit with supervision and cues on positioning. Pt verbalizes soreness in bilateral ribs. PT provides rest breaks to manage pain. WC transport to gym for time management. Pt performs standing balance activities in parallel bars to work on posture and hip strength. Pt initially performs side stepping x50' total, facing mirror for visual feedback. Pt uses L hand on parallel bar for support initially but performs final x20' without upper extremity support. PT provides verbal and tactile cues for posture, trunk extension, and step pattern to optimize strengthening in hips. Pt requires extended seated rest breaks between all bouts of mobility. Pt then performs sidesteps with level 3 theraband just distal to knees, 2x30'. Pt performs bilateral hip abduction 2x10 and minisquats with theraband for glute med engagement 1x20. Pt performs stand step transfer back to bed with supervision for balance and positioning. Left supine in bed with alarm intact and all needs within reach.  Therapy Documentation Precautions:  Precautions Precautions: Fall Precaution Comments: NWB RUE- no shoulder ROM, ok wrist and hand. Sling OOB Required Braces or Orthoses: Sling Restrictions Weight Bearing Restrictions: Yes RUE Weight Bearing: Non weight bearing   Therapy/Group: Individual Therapy  Breck Coons, PT, DPT 04/18/2020, 3:55 PM

## 2020-04-18 NOTE — Patient Care Conference (Signed)
Inpatient RehabilitationTeam Conference and Plan of Care Update Date: 04/18/2020   Time: 10:48 AM    Patient Name: Amanda [name]      Medical Record Number: [id number]  Date of Birth: [birth year] Sex: Female         Room/Bed: 4W24C/4W24C-01 Payor Info: Payor: MEDICARE / Plan: MEDICARE PART A AND B / Product Type: *No Product type* /    Admit Date/Time:  04/08/2020  2:54 PM  Primary Diagnosis:  Multiple trauma  Hospital Problems: Principal Problem:   Multiple trauma Active Problems:   TBI (traumatic brain injury) ([place])   Chronic pain syndrome   Essential hypertension   Tracheobronchitis   Hypokalemia    Expected Discharge Date: Expected Discharge Date: 04/20/20  Team Members Present: Physician leading conference: Dr. Alger Simons Care Coodinator Present: Loralee Pacas, LCSWA;Laura-Lee Villegas Creig Hines, RN, BSN, [place] Nurse Present: Dwaine Gale, RN PT Present: Tereasa Coop, PT OT Present: Cherylynn Ridges, OT PPS Coordinator present : Ileana Ladd, Burna Mortimer, SLP     Current Status/Progress Goal Weekly Team Focus  Bowel/Bladder   Continent of B/B, LBM 04/16/2020  Maintain continence  Assess Qshift and PRN   Swallow/Nutrition/ Hydration             ADL's   Min A/Supervision  Supervision  Self-care retraining, activity tolerance, balance, dc planning   Mobility   supervision bed mobility, transfers, ambulation with SPC x150', x4 steps with LHR  S transfers, CGA for ambulation & stairs  DC prep, gait, balance, steps   Communication             Safety/Cognition/ Behavioral Observations            Pain   No complaints of pain  Pain <3/10  Assess Qshift and PRN   Skin   Skin intact (bruising)  Maintain skin integrity  Assess Qshift and PRN     Discharge Planning:  D/c to home with 24/7 care from husband. fam edu on 2/16 1pm-3pm with husband.   Team Discussion: Will do follow x-ray on shoulder. Continent B/B, eating well, Tylenol for pain. Family education tomorrow with  husband. Patient on target to meet rehab goals: Mostly supervision with ambulation and ADL's. Still needs some assist on right side. Supervision, takes short steps, small steps. On track for discharge.  *See Care Plan and progress notes for long and short-term goals.   Revisions to Treatment Plan:  Follow up x-ray for shoulder.  Teaching Needs: Family education, medication management, skin/wound care, transfer training, gait training, stair training, balance training, endurance training.  Current Barriers to Discharge: Inaccessible home environment, Decreased caregiver support, Home enviroment access/layout, Wound care, Lack of/limited family support, Weight bearing restrictions, Medication compliance and Behavior  Possible Resolutions to Barriers: Continue current medications, provide emotional support to patient and family.     Medical Summary Current Status: improved cognition, pain an issue at times but meds are covering. started pendulum exercises last week  Barriers to Discharge: Medical stability   Possible Resolutions to [employer] Focus: daily lab and pt data assessment, pain control, consulting with ortho about advancing RUE/f/u   Continued Need for Acute Rehabilitation Level of Care: The patient requires daily medical management by a physician with specialized training in physical medicine and rehabilitation for the following reasons: Direction of a multidisciplinary physical rehabilitation program to maximize functional independence : Yes Medical management of patient stability for increased activity during participation in an intensive rehabilitation regime.: Yes Analysis of laboratory values and/or radiology reports with any  subsequent need for medication adjustment and/or medical intervention. : Yes   I attest that I was present, lead the team conference, and concur with the assessment and plan of the team.   Cristi Loron 04/18/2020, 3:56 PM

## 2020-04-18 NOTE — Progress Notes (Addendum)
Occupational Therapy Discharge Summary  Patient Details  Name: Amanda [name] MRN: [id number] Date of Birth: 18-Jul-1940   Patient has met 6 of 6 long term goals due to improved activity tolerance, improved balance, postural control, ability to compensate for deficits, functional use of  RIGHT upper extremity, improved attention, improved awareness and improved coordination.  Patient to discharge at overall Supervision  / CGA level.  Patient's care partner is independent to provide the necessary physical assistance at discharge for higher level iADL tasks.    Reasons goals not met: n/a  Recommendation:  Patient will benefit from ongoing skilled OT services in outpatient setting to continue to advance functional skills in the area of BADL, functional use of R UE  Equipment: Single Point Cane  Reasons for discharge: treatment goals met and discharge from hospital  Patient/family agrees with progress made and goals achieved: Yes  OT Discharge Precautions/Restrictions  Precautions Precautions: Fall Precaution Comments: NWB RUE- no shoulder ROM, ok wrist and hand. Sling OOB Required Braces or Orthoses: Sling Restrictions Weight Bearing Restrictions: Yes RUE Weight Bearing: Non weight bearing Pain Pain Assessment Pain Score: Asleep ADL ADL Eating: Modified independent Where Assessed-Eating: Edge of bed Grooming: Modified independent Where Assessed-Grooming: Edge of bed Upper Body Bathing: Supervision/safety Lower Body Bathing: Supervision/safety Upper Body Dressing: Contact guard Lower Body Dressing: Supervision/safety Toileting: Supervision/safety Where Assessed-Toileting: Bedside Commode Toilet Transfer: Close supervision Toilet Transfer Method: Stand pivot Science writer: Bedside commode Tub/Shower Transfer: Close supervison Tub/Shower Transfer Method: Unable to assess Social research officer, government: Unable to assess Social research officer, government Method: Unable to  assess Perception  Perception: Within Functional Limits Praxis Praxis: Intact Cognition Overall Cognitive Status: Within Functional Limits for tasks assessed Arousal/Alertness: Awake/alert Orientation Level: Oriented X4 Focused Attention: Appears intact Sustained Attention: Appears intact Selective Attention: Appears intact Memory: Appears intact Awareness: Appears intact Problem Solving: Appears intact Safety/Judgment: Appears intact [place] [employer] Scales of Cognitive Functioning: Purposeful/appropriate Sensation Sensation Light Touch: Appears Intact Coordination Gross Motor Movements are Fluid and Coordinated: No Fine Motor Movements are Fluid and Coordinated: No Coordination and Movement Description: R humerus fx and polytrauma with pain limitng mobility, generalized deconditiong Motor  Motor Motor - Discharge Observations: Polytrauma with generalized deconditioning  Balance Static Sitting Balance Static Sitting - Balance Support: No upper extremity supported;Feet supported Static Sitting - Level of Assistance: 6: Modified independent (Device/Increase time) Dynamic Sitting Balance Dynamic Sitting - Balance Support: No upper extremity supported;During functional activity Dynamic Sitting - Level of Assistance: 5: Stand by assistance Static Standing Balance Static Standing - Balance Support: No upper extremity supported Static Standing - Level of Assistance: 5: Stand by assistance Dynamic Standing Balance Dynamic Standing - Balance Support: No upper extremity supported Dynamic Standing - Level of Assistance: 5: Stand by assistance Extremity/Trunk Assessment RUE Assessment RUE Assessment: Exceptions to [facility] General Strength Comments: Proximal humeral fx- no MMT, no AROM encouraged at the shoulder. R hand with edema LUE Assessment LUE Assessment: Within Functional Limits   Daneen Schick Meklit Cotta 04/18/2020, 1:30 PM

## 2020-04-18 NOTE — Progress Notes (Signed)
Occupational Therapy Session Note  Patient Details  Name: Amanda [name] MRN: [id number] Date of Birth: 02/23/1941  Today's Date: 04/18/2020 OT Individual Time: 0930-1015 OT Individual Time Calculation (min): 45 min    Short Term Goals: Week 2:  OT Short Term Goal 1 (Week 2): LTG-STG 2.2 ELOS  Skilled Therapeutic Interventions/Progress Updates:    Patient in bed, alert and ready for therapy session.  She notes pain under control at this time.  Supine to sitting edge of bed with CS.  Mod A to donn sling.  Ambulation with SPC to/from therapy gym with CS/CGA.  Completed trunk mobility and light stretching.  Reviewed deep breathing techniques, practiced incentive spirometer.  Upon return to room ambulated with [facility] to toilet, min A for clothing management, she is able to complete hygiene with CS.  Returned to bed with CS.  Bed alarm set and call bell in reach.    Therapy Documentation Precautions:  Precautions Precautions: Fall Precaution Comments: NWB RUE- no shoulder ROM, ok wrist and hand. Sling OOB Required Braces or Orthoses: Sling Restrictions Weight Bearing Restrictions: Yes RUE Weight Bearing: Non weight bearing   Therapy/Group: Individual Therapy  Carlos Levering 04/18/2020, 7:38 AM

## 2020-04-18 NOTE — Progress Notes (Incomplete)
Patient ID: Amanda [name], female   DOB: Oct 11, 1940, 80 y.o.   MRN: [id number]   Loralee Pacas, MSW, [place] Office: [phone] Cell: [phone] Fax: [phone]

## 2020-04-18 NOTE — Progress Notes (Signed)
Physical Therapy Session Note  Patient Details  Name: Amanda [name] MRN: [id number] Date of Birth: Feb 01, 1941  Today's Date: 04/18/2020 PT Individual Time: 1120-1200 PT Individual Time Calculation (min): 40 min   Short Term Goals: Week 2:  PT Short Term Goal 1 (Week 2): STGs=LTGs  Skilled Therapeutic Interventions/Progress Updates:     Patient in bed asleep upon PT arrival. Patient easily aroused and agreeable to PT session. Patient denied pain during session, however, stated that she felt "groggy" this morning due to taking pain medication and a muscle relaxer.   Patient had several questions about TBI provided education on symptoms, etiology, post traumatic amnesia, [place] levels of recovery, symptom fluctuation, triggers, and symptom management. Also educated patient on other injuries and patient asked about CPR being performed by EMS or at admission. Reviewed patient's chart and educated on hospital course of stay, injuries, and interventions performed.   Patient performed supine to/form sit with supervision and sat EOB independently during education. Limited mobility due to patient feeling fatigued and "groggy" from medications.    Patient in bed at end of session with breaks locked, bed alarm set, and all needs within reach.    Therapy Documentation Precautions:  Precautions Precautions: Fall Precaution Comments: NWB RUE- no shoulder ROM, ok wrist and hand. Sling OOB Required Braces or Orthoses: Sling Restrictions Weight Bearing Restrictions: Yes RUE Weight Bearing: Non weight bearing   Therapy/Group: Individual Therapy  Sahasra Belue L Jovanne Riggenbach PT, DPT  04/18/2020, 4:52 PM

## 2020-04-18 NOTE — Progress Notes (Signed)
[place] PHYSICAL MEDICINE & REHABILITATION PROGRESS NOTE  Subjective/Complaints: Did well again last night. Hopeful that her dc date will be moved up! Still some back pain with movement  ROS: Patient denies fever, rash, sore throat, blurred vision, nausea, vomiting, diarrhea, cough, shortness of breath or chest pain,   headache, or mood change.     Objective: Vital Signs: Blood pressure 139/76, pulse 71, temperature 98 F (36.7 C), temperature source Oral, resp. rate 16, height 5' (1.524 m), weight 69.5 kg, SpO2 94 %. No results found. Recent Labs    04/17/20 0509  WBC 5.3  HGB 10.7*  HCT 33.9*  PLT 508*   Recent Labs    04/17/20 0509  NA 139  K 3.8  CL 105  CO2 24  GLUCOSE 106*  BUN 12  CREATININE 0.72  CALCIUM 8.6*    Intake/Output Summary (Last 24 hours) at 04/18/2020 0951 Last data filed at 04/18/2020 0700 Gross per 24 hour  Intake 360 ml  Output --  Net 360 ml        Physical Exam: BP 139/76 (BP Location: Left Arm)   Pulse 71   Temp 98 F (36.7 C) (Oral)   Resp 16   Ht 5' (1.524 m)   Wt 69.5 kg   SpO2 94%   BMI 29.92 kg/m  Constitutional: No distress . Vital signs reviewed. HEENT: EOMI, oral membranes moist Neck: supple Cardiovascular: RRR without murmur. No JVD    Respiratory/Chest: CTA Bilaterally without wheezes or rales. Normal effort    GI/Abdomen: BS +, non-tender, non-distended Ext: no clubbing, cyanosis, or edema Psych: pleasant and cooperative Skin: scattered bruising right upper ext, RLE, resolving Musc: Right shoulder and right hip with edema and tenderness. Neuro: Alert Motor: Right upper extremity proximally limited by pain, handgrip 3+/5  Left upper extremity: 5/5 proximal distal Right lower extremity: 3/5 proximal to 4+/5distal  Left lower extremity: 4+/5 proximal distal   Assessment/Plan: 1. Functional deficits which require 3+ hours per day of interdisciplinary therapy in a comprehensive inpatient rehab  setting.  Physiatrist is providing close team supervision and 24 hour management of active medical problems listed below.  Physiatrist and rehab team continue to assess barriers to discharge/monitor patient progress toward functional and medical goals   Care Tool:  Bathing  Bathing activity did not occur: Safety/medical concerns Body parts bathed by patient: Right arm,Chest,Abdomen,Front perineal area,Right upper leg,Left upper leg,Face   Body parts bathed by helper: Left arm,Buttocks,Right lower leg,Left lower leg     Bathing assist Assist Level: Moderate Assistance - Patient 50 - 74%     Upper Body Dressing/Undressing Upper body dressing Upper body dressing/undressing activity did not occur (including orthotics): Safety/medical concerns What is the patient wearing?: Pull over shirt    Upper body assist Assist Level: Maximal Assistance - Patient 25 - 49%    Lower Body Dressing/Undressing Lower body dressing    Lower body dressing activity did not occur: Safety/medical concerns What is the patient wearing?: Pants,Underwear/pull up     Lower body assist Assist for lower body dressing: Moderate Assistance - Patient 50 - 74%     Toileting Toileting    Toileting assist Assist for toileting: Minimal Assistance - Patient > 75%     Transfers Chair/bed transfer  Transfers assist  Chair/bed transfer activity did not occur: Safety/medical concerns (dizziness in standing limiting safety with mobility)  Chair/bed transfer assist level: Supervision/Verbal cueing     Locomotion Ambulation   Ambulation assist   Ambulation activity did  not occur: Safety/medical concerns  Assist level: Supervision/Verbal cueing Assistive device: Cane-quad Max distance: 403'   Walk 10 feet activity   Assist  Walk 10 feet activity did not occur: Safety/medical concerns  Assist level: Supervision/Verbal cueing Assistive device: Cane-quad   Walk 50 feet activity   Assist Walk 50  feet with 2 turns activity did not occur: Safety/medical concerns  Assist level: Supervision/Verbal cueing Assistive device: Cane-quad    Walk 150 feet activity   Assist Walk 150 feet activity did not occur: Safety/medical concerns  Assist level: Supervision/Verbal cueing Assistive device: Cane-quad    Walk 10 feet on uneven surface  activity   Assist Walk 10 feet on uneven surfaces activity did not occur: Safety/medical concerns         Wheelchair     Assist Will patient use wheelchair at discharge?: No Type of Wheelchair: Manual Wheelchair activity did not occur: Safety/medical concerns (unable to transfer to w/c due to dizziness)  Wheelchair assist level: Supervision/Verbal cueing Max wheelchair distance: 150    Wheelchair 50 feet with 2 turns activity    Assist    Wheelchair 50 feet with 2 turns activity did not occur: Safety/medical concerns   Assist Level: Supervision/Verbal cueing   Wheelchair 150 feet activity     Assist  Wheelchair 150 feet activity did not occur: Safety/medical concerns   Assist Level: Supervision/Verbal cueing  BP 139/76 (BP Location: Left Arm)   Pulse 71   Temp 98 F (36.7 C) (Oral)   Resp 16   Ht 5' (1.524 m)   Wt 69.5 kg   SpO2 94%   BMI 29.92 kg/m    Medical Problem List and Plan: 1.  Deficits with mobility, transfers, ADLs secondary to polytrauma with TBI. RLAS VII  -Continue CIR therapies including PT, OT SLP  -ELOS 2/19---team conference today 2.  DVT Prophylaxis/Anticoagulation: Mechanical: Sequential compression devices, below knee Bilateral lower extremities Pharmaceutical: Lovenox.              Antiplatelets: Aspirin 81 3.  Acute on chronic pain Management:              Tylenol as needed             Robaxin 1000 mg every 8 scheduled  Pain appears fairly well controlled 2/11 4. Mood: Team support             Antipsychotic: Wellbutrin 5. Neuropsych: This patient is?  Fully capable of making  decisions on her own behalf. 6. Skin/Wound Care: Routine skin care 7. Fluids/Electrolytes/Nutrition: Monitor I/Os.             -hypokalemia;     -3.8 on 2/14---continue supp  -albumin low---protein supp      8.  Right proximal humeral fracture: Nonweightbearing per Ortho and sling.  -Continue Codman's exercises .   -check with Dr. Doreatha Martin prior to this dc about any need for f/u imaging, etc 9.  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  CTA of head/neck no arterial injury. 10.  C2-4 transverse process fracture: C-collar not required per neurosurgery 11.  Essential hypertension             Norvasc increased to 10mg  daily  2/15 bp controlled 12.  Marginal zone lymphoma             Follow-up as outpatient with Dr. Jonette Eva 13.  Acute hypoxic respiratory failure: Tracheobronchitis respiratory cultures with Pseudomonas.  IV cefepime 2/4-2/8 completed.   -remains afebrile 2/8, activity tolerance better   -encouarge OOB, IS 14.  Constipation             large bm 2/8 after sorbitol  -continue senna-s at bedtime 15.  Hypothyroidism: Synthroid 125 daily 16.  GERD: Protonix 20 mg daily  LOS: 10 days A FACE TO FACE EVALUATION WAS PERFORMED  Meredith Staggers 04/18/2020, 9:51 AM

## 2020-04-19 MED ORDER — ACETAMINOPHEN 325 MG PO TABS: 325.0000 mg | ORAL_TABLET | Freq: Three times a day (TID) | ORAL | Status: AC | PRN

## 2020-04-19 MED ORDER — SENNOSIDES-DOCUSATE SODIUM 8.6-50 MG PO TABS: 2.0000 | ORAL_TABLET | Freq: Every day | ORAL | 0 refills | Status: AC

## 2020-04-19 NOTE — Progress Notes (Signed)
Patient ID: AIBHLINN KALMAR, female   DOB: February 12, 1941, 80 y.o.   MRN: [id number]  SW met with pt in room to provide updates from team conference, and discuss discharge recommendations: Outpatient PT/OT and SPC. Pt preferred location is [facility] (;:[phone]/[phone]). Pt also stated PT discuss hurrycane vs SPC. SW waiting on updates from PT to confirm. Pt husband will be here this afternoon for family edu.   SW faxed outpatient PT/OT referral to [facility]   SW updated pt and pt husband that vendor does not have anymore hurrycanes' in retail store, and informed on where she can purchase. Pt has a SPC at home already. SW updated on referral sent.   Loralee Pacas, MSW, [place] Office: [phone] Cell: [phone] Fax: [phone]

## 2020-04-19 NOTE — Progress Notes (Signed)
Occupational Therapy Session Note  Patient Details  Name: Amanda [name] MRN: [id number] Date of Birth: [birth year]  Today's Date: 04/19/2020 OT Individual Time: 0700-0757 OT Individual Time Calculation (min): 57 min   Session 2 45 min 1300-1345   Short Term Goals: Week 1:  OT Short Term Goal 1 (Week 1): Pt will don UB clothing with min A maintaining shoulder precautions OT Short Term Goal 1 - Progress (Week 1): Met OT Short Term Goal 2 (Week 1): Pt will don pants with min A with LRAD OT Short Term Goal 2 - Progress (Week 1): Met OT Short Term Goal 3 (Week 1): Pt will complete BSC transfer with CGA OT Short Term Goal 3 - Progress (Week 1): Met  Skilled Therapeutic Interventions/Progress Updates:    1:1. Pt received in bed agreeable to OT after increased time to arouse. Pt completes all mobility with SPC and supervision. Pt completes bathing, dressing, toileting with S overall. Pt requires min VC for hemi strategies during bathing and dressing tasks. Pt continues to demo decreased endruance as evidenced by fatigue/out of breath after standing to pull both pants and underwear up. Pt requries increased time to manage packages and containers for breakfast. Exited session with pt seated in bed, exit alarm on and call light in reach  Session 2: Pt and husband present for family educations. Pt, OT and husband discuss energy conservation, hemi dressing, PROM (cleared by PA who came during session- FF to tolerance, Abduct <30*) full elbow/wrist AROM, and supervision-CGA techniques. Husband feels comfortable with TTB transfers as pt has used this since last admission in 2020. Pt and husband with question on using step stool to get into bed. Pt and OT demo stepping back and up onto step with stronger RLE and then sitting back onto bed. Pt and husband return demo. Exited session with pt seated in w/c, exit alarm on and call light in reach    Therapy Documentation Precautions:  Precautions Precautions:  Fall Precaution Comments: NWB RUE- no shoulder ROM, ok wrist and hand. Sling OOB Required Braces or Orthoses: Sling Restrictions Weight Bearing Restrictions: Yes RUE Weight Bearing: Non weight bearing General:   Vital Signs: Therapy Vitals Temp: 97.9 F (36.6 C) Temp Source: Oral Pulse Rate: 69 Resp: 16 BP: 138/77 Patient Position (if appropriate): Lying Oxygen Therapy SpO2: 95 % O2 Device: Room Air Pain: Pain Assessment Pain Scale: 0-10 Pain Score: 2  Pain Type: Acute pain Pain Location: Back Pain Descriptors / Indicators: Aching Pain Intervention(s): Repositioned ADL: ADL Eating: Modified independent Where Assessed-Eating: Edge of bed Grooming: Modified independent Where Assessed-Grooming: Edge of bed Upper Body Bathing: Supervision/safety Lower Body Bathing: Supervision/safety Upper Body Dressing: Contact guard Lower Body Dressing: Supervision/safety Toileting: Supervision/safety Where Assessed-Toileting: Bedside Commode Toilet Transfer: Close supervision Toilet Transfer Method: Stand pivot Science writer: Bedside commode Tub/Shower Transfer: Close supervison Clinical cytogeneticist Method: Unable to assess Social research officer, government: Unable to assess Social research officer, government Method: Unable to assess Vision   Perception    Praxis   Exercises:   Other Treatments:     Therapy/Group: Individual Therapy  Tonny Branch 04/19/2020, 6:43 AM

## 2020-04-19 NOTE — Progress Notes (Signed)
Inpatient Rehabilitation Care Coordinator Discharge Note  The overall goal for the admission was met for:   Discharge location: Yes. D/c to home with 24/7 care.  Length of Stay: Yes. 12 days.   Discharge activity level: Yes. Supervision.   Home/community participation: Yes. Limited.   Services provided included: MD, RD, PT, OT, SLP, RN, CM, TR, Pharmacy, Neuropsych and SW  Financial Services: Medicare and Private Insurance: [place] offered to/list presented to:Yes  Follow-up services arranged: Outpatient: [facility] for outpatient PT/OT and DME: pt to private purchase a hurrycane  Comments (or additional information):  Patient/Family verbalized understanding of follow-up arrangements: Yes  Individual responsible for coordination of the follow-up plan: Contact pt [phone] or pt husband [name] [phone]  Confirmed correct DME delivered: Rana Snare 04/19/2020    Rana Snare

## 2020-04-19 NOTE — Progress Notes (Signed)
Physical Therapy Discharge Summary  Patient Details  Name: Amanda [name] MRN: [id number] Date of Birth: 11-20-1940  Today's Date: 04/19/2020 PT Individual Time: 1004-1059 and 1062-6948 PT Individual Time Calculation (min): 55 min and 54 min   Patient has met 9 of 9 long term goals due to improved activity tolerance, improved balance, improved postural control and increased strength.  Patient to discharge at an ambulatory level Supervision.   Patient's care partner is independent to provide the necessary physical assistance at discharge.  Reasons goals not met: NA  Recommendation:  Patient will benefit from ongoing skilled PT services in outpatient setting to continue to advance safe functional mobility, address ongoing impairments in strength, balance, ambulation, and minimize fall risk.  Equipment: No equipment provided  Reasons for discharge: treatment goals met and discharge from hospital  Patient/family agrees with progress made and goals achieved: Yes   Skilled therapeutic Interventions: 1st Session: Pt received supine in bed and agrees to therapy. No report of pain. Pt performs supine to sit with increased time. Sit to stand and stand pivot transfer to [name] with cues on body mechanics and positioning. WC transport to gym for time management and energy conservation. Pt performs Nustep activity for strength and endurance training as well as reciprocal coordination. Pt completes x5:00 at workload of 4 with steps per minute >50 and x7:00 at workload of 5 with steps per minutes > 45. Pt then performs sit to stand activity with level 3 theraband around distal thighs. Pt performs 2x5 and 1x10 reps of sit to stand without use of bilateral upper extremities. PT cues for body mechanics, anterior weight shift, and maintaining externally rotated and abducted hips for strengthening. Pt educated on importance of increasing time out of bed during day to prevent deconditioning. Pt verbalizes  understanding and remains in [facility] with all needs within reach following session.  2nd Session: Pt received seated in [facility] and agrees to therapy. Husband present for family education. PT answers questions regarding mobility and educates husband on importance of encouraging pt to increase time out of bed during day. Also educated on correct sizing for SPC. WC transport to gym for time management. Pt performs car transfer and ramp navigation with verbal cues on sequencing, and multimodal cues for posture to improve balance and gait mechanics. Pt completes x12 6" steps with LHR and cues for step sequencing. Pt then ambulates x183' with [facility] and cues for upright gaze to improve posture and balance, and increasing bilateral stride length to decrease risk for falls. Pt able to pick up cup from ground with close supervision and cues on body mechanics. PT provides pt with HEP and answers questions regarding performance. Pt left seated in WC with all needs within reach.  PT Discharge Precautions/Restrictions Precautions Precautions: Fall Precaution Comments: NWB RUE- no shoulder ROM, ok wrist and hand. Sling OOB Restrictions Weight Bearing Restrictions: Yes RUE Weight Bearing: Non weight bearing Vision/Perception  Perception Perception: Within Functional Limits Praxis Praxis: Intact  Cognition Overall Cognitive Status: Within Functional Limits for tasks assessed Arousal/Alertness: Awake/alert Orientation Level: Oriented X4 Safety/Judgment: Appears intact Sensation Sensation Light Touch: Appears Intact Coordination Gross Motor Movements are Fluid and Coordinated: No Fine Motor Movements are Fluid and Coordinated: No Coordination and Movement Description: R humerus fx and polytrauma with pain limitng mobility, generalized deconditiong Motor  Motor Motor: Abnormal postural alignment and control;Other (comment) Motor - Discharge Observations: Polytrauma with generalized deconditioning  Mobility Bed  Mobility Bed Mobility: Sit to Supine;Supine to Sit Supine to Sit:  Supervision/Verbal cueing Sit to Supine: Supervision/Verbal cueing Transfers Sit to Stand: Supervision/Verbal cueing Stand to Sit: Supervision/Verbal cueing Stand Pivot Transfers: Supervision/Verbal cueing Stand Pivot Transfer Details: Verbal cues for technique;Verbal cues for safe use of DME/AE Transfer (Assistive device): Straight cane Locomotion  Gait Ambulation: Yes Gait Assistance: Supervision/Verbal cueing Gait Distance (Feet): 183 Feet Assistive device: Straight cane Gait Assistance Details: Verbal cues for technique;Verbal cues for gait pattern Gait Gait: Yes Gait Pattern: Impaired Gait Pattern: Decreased stride length Gait velocity: dcr Stairs / Additional Locomotion Stairs: Yes Stairs Assistance: Supervision/Verbal cueing Stair Management Technique: One rail Left Number of Stairs: 12 Height of Stairs: 6 Ramp: Supervision/Verbal cueing Curb: Supervision/Verbal cueing Wheelchair Mobility Wheelchair Mobility: No  Trunk/Postural Assessment  Cervical Assessment Cervical Assessment: Exceptions to [facility] (forward head) Thoracic Assessment Thoracic Assessment: Exceptions to [facility] (rounded shoulders) Lumbar Assessment Lumbar Assessment: Exceptions to [facility] (posterior pelvic tilt) Postural Control Postural Control: Deficits on evaluation  Balance Balance Balance Assessed: Yes Static Sitting Balance Static Sitting - Balance Support: No upper extremity supported;Feet supported Static Sitting - Level of Assistance: 6: Modified independent (Device/Increase time) Dynamic Sitting Balance Dynamic Sitting - Balance Support: No upper extremity supported;During functional activity Dynamic Sitting - Level of Assistance: 5: Stand by assistance Static Standing Balance Static Standing - Balance Support: No upper extremity supported Static Standing - Level of Assistance: 5: Stand by assistance Dynamic Standing  Balance Dynamic Standing - Balance Support: No upper extremity supported Dynamic Standing - Level of Assistance: 5: Stand by assistance Extremity Assessment  RLE Assessment RLE Assessment: Exceptions to [facility] General Strength Comments: grossly 4/5 LLE Assessment LLE Assessment: Exceptions to [facility] General Strength Comments: grossly at least 4+/5    Breck Coons, PT, DPT 04/19/2020, 4:24 PM

## 2020-04-19 NOTE — Progress Notes (Signed)
Orthopaedic Trauma Progress Note  SUBJECTIVE: Reports mild pain about operative site. Has come out of the sling some for some gentle elbow motion. Is eager to go home tomorrow. Will be going to [facility] after discharge. Husband at bedside  OBJECTIVE:  Vitals:   04/19/20 0352 04/19/20 1301  BP: 138/77 128/76  Pulse: 69 72  Resp: 16 16  Temp: 97.9 F (36.6 C) 98 F (36.7 C)  SpO2: 95% 97%    General: Sitting up in bed, NAd Respiratory: No increased work of breathing.  RUE: Sling currently in place. No significant swelling noted throughout extremity. Tender with palpation about shoulder, non-tender in elbow or forearm. Tolerated passive forward elevation of the shoulder to about 60 degrees, notes some discomfort with returning arm to neutral. Tolerate elbow motion without pain. Motor and sensory function intact distally. Neurovascularly intact  IMAGING: Repeat imaging done 04/18/20 appears stable.   LABS: No results found for this or any previous visit (from the past 24 hour(s)).  ASSESSMENT: Amanda [name] is a 80 y.o. female with right proximal humerus fracture treated non-operatively  PLAN: Weightbearing: NWB RLE Orthopedic device(s): Sling for comfort  Pain management: Continue current regimen VTE prophylaxis: per primary team, SCDs Dispo: Ok to begin some gentle passive forward elevation of the shoulder as tolerated. Would avoid any abduction greater than 15-30 degrees at this point.  Advance to active assisted ROM as tolerated.   Follow - up plan: 2-3 weeks after discharge for repeat x-rays  Contact information:  Katha Hamming MD, Patrecia Pace PA-C. After hours and holidays please check [email or web address] for group call information for [facility]   Johnny Gorter A. Ricci Barker, PA-C [phone] (office) [email or web address]

## 2020-04-19 NOTE — Discharge Instructions (Addendum)
Inpatient Rehab Discharge Instructions  Amanda [name] Discharge date and time:    Activities/Precautions/ Functional Status: Activity: no lifting, driving, or strenuous exercise till cleared by MD. No weight on right shoulder. Wear sling for support. .  Diet: regular diet Wound Care: Keep area clean and dry. Contact Dr. Doreatha Martin  if you develop any problems with your incision/wound--redness, swelling, increase in pain, drainage or if you develop fever or chills.    Functional status:  ___ No restrictions     ___ Walk up steps independently _X__ 24/7 supervision/assistance   ___ Walk up steps with assistance ___ Intermittent supervision/assistance  ___ Bathe/dress independently ___ Walk with walker     _X__ Bathe/dress with assistance ___ Walk Independently    ___ Shower independently _X__ Walk with supervision.     ___ Shower with assistance _X__ No alcohol     ___ Return to work/school ________   COMMUNITY REFERRALS UPON DISCHARGE:      Outpatient: PT     OT             Agency: [facility] Phone: [phone]             Appointment Date/Time:*Please expect follow-up within 7-10 business days to discuss scheduling your appointment. If you have not received follow-up, be sure to contact the site directly.*  Medical Equipment/Items Ordered: hurrycane- will private purchase.                                                  Agency/Supplier: N/A    Special Instructions:    My questions have been answered and I understand these instructions. I will adhere to these goals and the provided educational materials after my discharge from the hospital.  Patient/Caregiver Signature _______________________________ Date __________  Clinician Signature _______________________________________ Date __________  Please bring this form and your medication list with you to all your follow-up doctor's appointments.       Orthopaedic Trauma Service Discharge Instructions   General  Discharge Instructions  WEIGHT BEARING STATUS: Non-weightbearing right upper extremity  RANGE OF MOTION/ACTIVITY: Ok to begin gentle passive forward elevation of shoulder as tolerated. Would avoid abduction greater than 15-30 degrees. May advance to active assisted range of motion as tolerated.   Diet: as you were eating previously.  Can use over the counter stool softeners and bowel preparations, such as Miralax, to help with bowel movements.  Narcotics can be constipating.  Be sure to drink plenty of fluids  PAIN MEDICATION USE AND EXPECTATIONS  You have likely been given narcotic medications to help control your pain.  After a traumatic event that results in an fracture (broken bone) with or without surgery, it is ok to use narcotic pain medications to help control one's pain.  We understand that everyone responds to pain differently and each individual patient will be evaluated on a regular basis for the continued need for narcotic medications. Ideally, narcotic medication use should last no more than 6-8 weeks (coinciding with fracture healing).   As a patient it is your responsibility as well to monitor narcotic medication use and report the amount and frequency you use these medications when you come to your office visit.   We would also advise that if you are using narcotic medications, you should take a dose prior to therapy to maximize you participation.  IF  YOU ARE ON NARCOTIC MEDICATIONS IT IS NOT PERMISSIBLE TO OPERATE A MOTOR VEHICLE (MOTORCYCLE/CAR/TRUCK/MOPED) OR HEAVY MACHINERY DO NOT MIX NARCOTICS WITH OTHER CNS (CENTRAL NERVOUS SYSTEM) DEPRESSANTS SUCH AS ALCOHOL   STOP SMOKING OR USING NICOTINE PRODUCTS!!!!  As discussed nicotine severely impairs your body's ability to heal surgical and traumatic wounds but also impairs bone healing.  Wounds and bone heal by forming microscopic blood vessels (angiogenesis) and nicotine is a vasoconstrictor (essentially, shrinks blood vessels).   Therefore, if vasoconstriction occurs to these microscopic blood vessels they essentially disappear and are unable to deliver necessary nutrients to the healing tissue.  This is one modifiable factor that you can do to dramatically increase your chances of healing your injury.    (This means no smoking, no nicotine gum, patches, etc)     ICE AND ELEVATE INJURED/OPERATIVE EXTREMITY  Using ice and elevating the injured extremity above your heart can help with swelling and pain control.  Icing in a pulsatile fashion, such as 20 minutes on and 20 minutes off, can be followed.    Do not place ice directly on skin. Make sure there is a barrier between to skin and the ice pack.    Using frozen items such as frozen peas works well as the conform nicely to the are that needs to be iced.    CALL THE OFFICE WITH ANY QUESTIONS OR CONCERNS: [phone]   VISIT OUR WEBSITE FOR ADDITIONAL INFORMATION: [email or web address]

## 2020-04-20 DIAGNOSIS — S42209A Unspecified fracture of upper end of unspecified humerus, initial encounter for closed fracture: Secondary | ICD-10-CM

## 2020-04-20 MED ORDER — POTASSIUM CHLORIDE CRYS ER 20 MEQ PO TBCR: 20.0000 meq | EXTENDED_RELEASE_TABLET | Freq: Every day | ORAL | 0 refills | Status: DC

## 2020-04-20 MED ORDER — METHOCARBAMOL 500 MG PO TABS: 500.0000 mg | ORAL_TABLET | Freq: Three times a day (TID) | ORAL | 0 refills | Status: DC

## 2020-04-20 NOTE — Progress Notes (Signed)
[facility] PHYSICAL MEDICINE & REHABILITATION PROGRESS NOTE  Subjective/Complaints: Pt in good spirits. Excited about getting home!  ROS: Patient denies fever, rash, sore throat, blurred vision, nausea, vomiting, diarrhea, cough, shortness of breath or chest pain,   headache, or mood change. .     Objective: Vital Signs: Blood pressure (!) 150/70, pulse 67, temperature 98.2 F (36.8 C), temperature source Oral, resp. rate 17, height 5' (1.524 m), weight 68.6 kg, SpO2 94 %. DG Humerus Right  Result Date: 04/18/2020 CLINICAL DATA:  [place], history of right humeral fracture EXAM: RIGHT HUMERUS - 2+ VIEW COMPARISON:  03/31/2020 FINDINGS: Frontal and lateral views of the right humerus demonstrate the comminuted right humeral neck fracture seen previously in stable position, with significant impaction and slight ventral displacement of the distal fracture fragment persistent soft tissue swelling. No dislocation. IMPRESSION: 1. Stable impaction and ventral displacement of the comminuted humeral neck fracture. No dislocation. Electronically Signed   By: Randa Ngo M.D.   On: 04/18/2020 17:56   No results for input(s): WBC, HGB, HCT, PLT in the last 72 hours. No results for input(s): NA, K, CL, CO2, GLUCOSE, BUN, CREATININE, CALCIUM in the last 72 hours.  Intake/Output Summary (Last 24 hours) at 04/20/2020 0846 Last data filed at 04/20/2020 0810 Gross per 24 hour  Intake 840 ml  Output -  Net 840 ml        Physical Exam: BP (!) 150/70   Pulse 67   Temp 98.2 F (36.8 C) (Oral)   Resp 17   Ht 5' (1.524 m)   Wt 68.6 kg   SpO2 94%   BMI 29.54 kg/m  Constitutional: No distress . Vital signs reviewed. HEENT: EOMI, oral membranes moist Neck: supple Cardiovascular: RRR without murmur. No JVD    Respiratory/Chest: CTA Bilaterally without wheezes or rales. Normal effort    GI/Abdomen: BS +, non-tender, non-distended Ext: no clubbing, cyanosis, or edema Psych: pleasant and  cooperative Skin: scattered bruising right upper ext, RLE, resolving Musc: Right shoulder tender with PROM. Neuro: Alert and oriented x 3. Normal insight and awareness. Intact Memory. Normal language and speech. Cranial nerve exam unremarkable  Motor: Right upper extremity proximally limited by pain, handgrip 3+/5  Left upper extremity: 5/5 proximal distal Right lower extremity: 3/5 proximal to 4+/5distal  Left lower extremity: 4+/5 proximal distal   Assessment/Plan: 1. Functional deficits which require 3+ hours per day of interdisciplinary therapy in a comprehensive inpatient rehab setting.  Physiatrist is providing close team supervision and 24 hour management of active medical problems listed below.  Physiatrist and rehab team continue to assess barriers to discharge/monitor patient progress toward functional and medical goals   Care Tool:  Bathing  Bathing activity did not occur: Safety/medical concerns Body parts bathed by patient: Right arm,Chest,Abdomen,Front perineal area,Right upper leg,Left upper leg,Face,Right lower leg,Left lower leg,Buttocks,Left arm   Body parts bathed by helper: Left arm,Buttocks,Right lower leg,Left lower leg     Bathing assist Assist Level: Supervision/Verbal cueing     Upper Body Dressing/Undressing Upper body dressing Upper body dressing/undressing activity did not occur (including orthotics): Safety/medical concerns What is the patient wearing?: Pull over shirt    Upper body assist Assist Level: Supervision/Verbal cueing    Lower Body Dressing/Undressing Lower body dressing    Lower body dressing activity did not occur: Safety/medical concerns What is the patient wearing?: Underwear/pull up,Pants     Lower body assist Assist for lower body dressing: Supervision/Verbal cueing     Toileting Toileting  Toileting assist Assist for toileting: Supervision/Verbal cueing     Transfers Chair/bed transfer  Transfers assist  Chair/bed  transfer activity did not occur: Safety/medical concerns (dizziness in standing limiting safety with mobility)  Chair/bed transfer assist level: Supervision/Verbal cueing     Locomotion Ambulation   Ambulation assist   Ambulation activity did not occur: Safety/medical concerns  Assist level: Supervision/Verbal cueing Assistive device: Cane-straight Max distance: 183'   Walk 10 feet activity   Assist  Walk 10 feet activity did not occur: Safety/medical concerns  Assist level: Supervision/Verbal cueing Assistive device: Cane-straight   Walk 50 feet activity   Assist Walk 50 feet with 2 turns activity did not occur: Safety/medical concerns  Assist level: Supervision/Verbal cueing Assistive device: Cane-straight    Walk 150 feet activity   Assist Walk 150 feet activity did not occur: Safety/medical concerns  Assist level: Supervision/Verbal cueing Assistive device: Cane-straight    Walk 10 feet on uneven surface  activity   Assist Walk 10 feet on uneven surfaces activity did not occur: Safety/medical concerns   Assist level: Supervision/Verbal cueing Assistive device: [employer] social research officer Will patient use wheelchair at discharge?: No Type of Wheelchair: Manual Wheelchair activity did not occur: Safety/medical concerns (unable to transfer to w/c due to dizziness)  Wheelchair assist level: Supervision/Verbal cueing Max wheelchair distance: 150    Wheelchair 50 feet with 2 turns activity    Assist    Wheelchair 50 feet with 2 turns activity did not occur: Safety/medical concerns   Assist Level: Supervision/Verbal cueing   Wheelchair 150 feet activity     Assist  Wheelchair 150 feet activity did not occur: Safety/medical concerns   Assist Level: Supervision/Verbal cueing  BP (!) 150/70   Pulse 67   Temp 98.2 F (36.8 C) (Oral)   Resp 17   Ht 5' (1.524 m)   Wt 68.6 kg   SpO2 94%   BMI 29.54 kg/m    Medical  Problem List and Plan: 1.  Deficits with mobility, transfers, ADLs secondary to polytrauma with TBI. RLAS VII  -DC home today  -Patient to see me in the office for transitional care encounter in 1-2 weeks.  2.  DVT Prophylaxis/Anticoagulation: Mechanical: Sequential compression devices, below knee Bilateral lower extremities Pharmaceutical: Lovenox.              Antiplatelets: Aspirin 81 3.  Acute on chronic pain Management:              Tylenol as needed             Robaxin 1000 mg every 8 scheduled  Pain appears fairly well controlled 2/11 4. Mood: Team support             Antipsychotic: Wellbutrin 5. Neuropsych: This patient is?  Fully capable of making decisions on her own behalf. 6. Skin/Wound Care: Routine skin care 7. Fluids/Electrolytes/Nutrition: Monitor I/Os.             -hypokalemia;     -3.8 on 2/14---continue supp  -albumin low---protein supp      8.  Right proximal humeral fracture: Nonweightbearing per Ortho and sling.  -appreciate ortho f/u  -can work on gentle forward flexion of shoulder, avoid abduction >15-30 degre   -advance to AAROM as tolerated   -f/u with [facility] in 2-3 weeks 9.  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  CTA of head/neck no arterial injury. 10.  C2-4 transverse process fracture: C-collar not  required per neurosurgery 11.  Essential hypertension             Norvasc increased to 10mg  daily  2/17 bp controlled 12.  Marginal zone lymphoma             Follow-up as outpatient with Dr. Jonette Eva 13.  Acute hypoxic respiratory failure: Tracheobronchitis respiratory cultures with Pseudomonas.               IV cefepime 2/4-2/8 completed.   -remains afebrile 2/8, activity tolerance better   -encouarge OOB, IS 14.  Constipation             lmoving bowels  -continue senna-s at bedtime 15.  Hypothyroidism: Synthroid 125 daily 16.  GERD: Protonix 20 mg daily  LOS: 12 days A FACE TO FACE EVALUATION WAS  PERFORMED  Meredith Staggers 04/20/2020, 8:46 AM

## 2020-04-20 NOTE — Progress Notes (Signed)
Pt being discharged off unit with belongings and  husband, discharge instructions and education given by PA. No complications noted at this time.   Dayna Ramus

## 2020-04-20 NOTE — Discharge Summary (Signed)
Physician Discharge Summary  Patient ID: Amanda [name] MRN: [id number] DOB/AGE: 1940-12-25 80 y.o.  Admit date: 04/08/2020 Discharge date: 04/20/2020  Discharge Diagnoses:  Principal Problem:   Multiple trauma Active Problems:   TBI (traumatic brain injury) ([place])   Essential hypertension   Tracheobronchitis   Hypokalemia   Closed fracture of part of upper end of humerus   Discharged Condition: stable   Significant Diagnostic Studies: DG Humerus Right  Result Date: 04/18/2020 CLINICAL DATA:  [place] [address], history of right humeral fracture EXAM: RIGHT HUMERUS - 2+ VIEW COMPARISON:  03/31/2020 FINDINGS: Frontal and lateral views of the right humerus demonstrate the comminuted right humeral neck fracture seen previously in stable position, with significant impaction and slight ventral displacement of the distal fracture fragment persistent soft tissue swelling. No dislocation. IMPRESSION: 1. Stable impaction and ventral displacement of the comminuted humeral neck fracture. No dislocation. Electronically Signed   By: Randa Ngo M.D.   On: 04/18/2020 17:56    Labs:  Basic Metabolic Panel: BMP Latest Ref Rng & Units 04/17/2020 04/10/2020 04/06/2020  Glucose 70 - 99 mg/dL 106(H) 93 89  BUN 8 - 23 mg/dL 12 8 17   Creatinine 0.44 - 1.00 mg/dL 0.72 0.59 0.74  BUN/Creat Ratio 6 - 22 (calc) - - -  Sodium 135 - 145 mmol/L 139 140 142  Potassium 3.5 - 5.1 mmol/L 3.8 3.1(L) 3.6  Chloride 98 - 111 mmol/L 105 101 111  CO2 22 - 32 mmol/L 24 27 21(L)  Calcium 8.9 - 10.3 mg/dL 8.6(L) 8.2(L) 7.8(L)    CBC: CBC Latest Ref Rng & Units 04/17/2020 04/10/2020 04/07/2020  WBC 4.0 - 10.5 K/uL 5.3 7.7 9.7  Hemoglobin 12.0 - 15.0 g/dL 10.7(L) 10.7(L) 9.1(L)  Hematocrit 36.0 - 46.0 % 33.9(L) 31.4(L) 27.6(L)  Platelets 150 - 400 K/uL 508(H) 417(H) 267    CBG: No results for input(s): GLUCAP in the last 168 hours.  Brief HPI:   Amanda [name] is a 80 y.o. female with history of lymphoma, macular degeneration,  bronchiectasis, hypertension who was admitted to [facility] on 03/31/2020 after a fall from her attic.  She fell 10 feet and was noted to have right-sided weakness with AMS-GCS 7.  Work-up revealed left frontal parietal lobe diffusion abnormality right C2/C3 transverse process fracture, left first and second rib fractures and right humerus fractures.  Ortho trauma was consulted for input and recommended nonweightbearing with sling and nonoperative management of humerus fracture.  Neurosurgery consulted for input and thought right hemiparesis was due to motor strip injury and that subacute fractures did not require cervical collar.  Hospital course was significant for acute hypoxic respiratory failure requiring intubation 1/28  to 01/30, Pseudomonas tracheobronchitis treated with cefepime as well as acute blood loss anemia requiring transfusion.  She continued to have issues with weakness, cognitive deficits as well as limitations due to weightbearing precautions.  CIR was recommended due to functional decline.   Hospital Course: Amanda [name] was admitted to rehab 04/08/2020 for inpatient therapies to consist of PT and OT at least three hours five days a week. Past admission physiatrist, therapy team and rehab RN have worked together to provide customized collaborative inpatient rehab.  She was maintained on IV cefepime through 2/08 and respiratory status has been stable.  She has been afebrile and follow-up CBC shows acute blood loss anemia to be slowly resolving and that leukocytosis has resolved.  Her blood pressures were monitored on TID basis and Norvasc was increased to 10 mg daily with  good control.  Her p.o. intake has been good.  Check of electrolytes showed hypokalemia therefore K-Dur added and continue during his stay.  Recommend repeat BMET on outpatient basis to decide on continuation of supplementation.   Protein supplements also added to promote healing due to low albumin stores. Ortho was consulted to  follow-up on right shoulder.  Follow-up x-rays are stable and patient was cleared to begin some gentle passive forward elevation of shoulder and to avoid any abduction greater than 50 to 30 degrees. To advance to active assisted ROM as tolerated.  She was maintained on Robaxin 1000 mg 3 times daily and was educated on tapering this past discharge.  Lovenox was used for DVT prophylaxis during his stay.  Pain is currently controlled with use of Tylenol alone and mood is stable.  She has made good gains during her rehab stay and supervision is recommended at discharge.  She will continue to receive outpatient PT at [facility] outpatient therapy at [place] after discharge   Rehab course: During patient's stay in rehab weekly team conferences were held to monitor patient's progress, set goals and discuss barriers to discharge. At admission, patient required mod assist with basic ADL task and min assist with mobility.  Cognitive evaluation showed mild cognitive impairments requiring increased time for processing.  She  has had improvement in activity tolerance, balance, postural control as well as ability to compensate for deficits.  She requires contact-guard assist to supervision to complete ADL tasks.  She requires supervision for transfers and to ambulate 183 feet with straight cane.  She requires verbal cues for technique and gait.  Patient made excellent gains in cognitive abilities and was able to complete functional and mildly complex tasks at modified independent level and speech therapy signed off by 02/10.  Family education was completed with husband.  Disposition: home   Diet: Regular  Special Instructions: 1. Recommend repeat BMET in 1-2 week to check pottasium level and determine need to continue [place].  2. NO weight on RUE. Sling for support.     Discharge Instructions    Ambulatory referral to Occupational Therapy   Complete by: As directed    Eval and treat   Ambulatory referral to Physical  Medicine Rehab   Complete by: As directed    1-2 weeks [facility] app[facility] with [facility]   Ambulatory referral to Physical Therapy   Complete by: As directed    Eval and treat     Allergies as of 04/20/2020      Reactions   Phenergan [promethazine Hcl] Other (See Comments)   Jerking/agitation   Preservision Areds 2 [multiple Vitamins-minerals] Nausea Only   Levaquin [levofloxacin] Nausea And Vomiting   Pravastatin Nausea And Vomiting   Preservision Areds 2 [multiple Vitamins-minerals] Nausea Only   Promethazine Other (See Comments)   "Agitation and jerking"   Levofloxacin Nausea And Vomiting   Pravastatin Nausea And Vomiting      Medication List    STOP taking these medications   acetaminophen 650 MG CR tablet Commonly known as: TYLENOL Replaced by: acetaminophen 325 MG tablet   azithromycin 250 MG tablet Commonly known as: Zithromax Z-Pak   chlorpheniramine-HYDROcodone 10-8 MG/5ML Suer Commonly known as: TUSSIONEX   levofloxacin 500 MG tablet Commonly known as: Levaquin   moxifloxacin 400 MG tablet Commonly known as: Avelox   ondansetron 4 MG tablet Commonly known as: ZOFRAN   predniSONE 10 MG tablet Commonly known as: DELTASONE   promethazine-dextromethorphan 6.25-15 MG/5ML syrup Commonly known as: PROMETHAZINE-DM  traZODone 50 MG tablet Commonly known as: DESYREL     TAKE these medications   acetaminophen 325 MG tablet Commonly known as: TYLENOL Take 1-2 tablets (325-650 mg total) by mouth every 8 (eight) hours as needed for mild pain. Replaces: acetaminophen 650 MG CR tablet   albuterol (2.5 MG/3ML) 0.083% nebulizer solution Commonly known as: PROVENTIL Take 2.5 mg by nebulization every 6 (six) hours as needed for wheezing or shortness of breath. What changed: Another medication with the same name was removed. Continue taking this medication, and follow the directions you see here.   amLODipine 10 MG tablet Commonly known as: NORVASC Take 10 mg by mouth  daily. What changed: Another medication with the same name was removed. Continue taking this medication, and follow the directions you see here.   aspirin 81 MG chewable tablet Chew 1 tablet (81 mg total) by mouth daily. What changed: Another medication with the same name was removed. Continue taking this medication, and follow the directions you see here.   buPROPion 150 MG 24 hr tablet Commonly known as: WELLBUTRIN XL Take 1 tablet (150 mg total) by mouth daily. What changed: Another medication with the same name was removed. Continue taking this medication, and follow the directions you see here.   famotidine 20 MG tablet Commonly known as: Pepcid Take 1 tablet (20 mg total) by mouth daily as needed for heartburn or indigestion. What changed: Another medication with the same name was removed. Continue taking this medication, and follow the directions you see here.   levothyroxine 125 MCG tablet Commonly known as: SYNTHROID Take 1 tablet (125 mcg total) by mouth daily before breakfast. What changed: Another medication with the same name was removed. Continue taking this medication, and follow the directions you see here.   Macular Health Formula Caps Take 1 capsule by mouth daily. What changed: Another medication with the same name was removed. Continue taking this medication, and follow the directions you see here.   methocarbamol 500 MG tablet Commonly known as: ROBAXIN Take 1-2 tablets (500-1,000 mg total) by mouth 3 (three) times daily. In a few days you can wean down to twice a day for a few days then once a day as needed   NON FORMULARY Take 1 tablet by mouth See admin instructions. Calcium/D-3/Magnesium- Take 0.5 tablet by mouth two times a day   omeprazole 20 MG capsule Commonly known as: PRILOSEC TAKE 1 CAPSULE(20 MG) BY MOUTH DAILY   potassium chloride SA 20 MEQ tablet Commonly known as: KLOR-CON Take 1 tablet (20 mEq total) by mouth daily. Notes to patient:  Continue due to low potassium levels.    senna-docusate 8.6-50 MG tablet Commonly known as: Senokot-S Take 2 tablets by mouth at bedtime.       Follow-up Information    Meredith Staggers, MD Follow up.   Specialty: Physical Medicine and Rehabilitation Why: Office will call you with follow up appointment Contact information: [address] [place] [address] [phone]        [name], MD. Call.   Specialty: Neurosurgery Why: for follow up on neck Contact information: [place] [address] [phone]        Shona Needles, MD. Schedule an appointment as soon as possible for a visit in 2 week(s).   Specialty: Orthopedic Surgery Why: Make an appointment for 2-3 weeks from date of discharge for follow up on shoulder Contact information: [place] [address]  Algood, [place], DO. Call.   Specialty: Family Medicine Why: for post hospital follow up/labs to check potassium level. Contact information: [address] [place] [address] [phone]               Signed: Bary Leriche 04/21/2020, 4:34 PM

## 2020-04-24 ENCOUNTER — Telehealth: Payer: Self-pay

## 2020-04-24 ENCOUNTER — Other Ambulatory Visit: Payer: Self-pay | Admitting: Family Medicine

## 2020-04-24 DIAGNOSIS — K219 Gastro-esophageal reflux disease without esophagitis: Secondary | ICD-10-CM

## 2020-04-24 MED ORDER — PANTOPRAZOLE SODIUM 40 MG PO TBEC: 40.0000 mg | DELAYED_RELEASE_TABLET | Freq: Every day | ORAL | 3 refills | Status: DC

## 2020-04-24 NOTE — Telephone Encounter (Signed)
.  Transition Care Management Follow-up Telephone Call  Date of discharge and from where: 04/20/20-[facility]  How have you been since you were released from the hospital? Doing very well.Ambulating with a cane. She has appt's scheduled for OT & PT for this week.  Any questions or concerns? No  Items Reviewed:  Did the pt receive and understand the discharge instructions provided? Yes   Medications obtained and verified? Yes but states she was told to stop Omeprazole & start Protonix-No script sent to pharmacy. This is not noted in the discharge med list.  Other? Yes   Any new allergies since your discharge? No   Dietary orders reviewed? Yes  Do you have support at home? Yes   Home Care and Equipment/Supplies: Were home health services ordered? no If so, what is the name of the agency? n/a  Has the agency set up a time to come to the patient's home? not applicable Were any new equipment or medical supplies ordered?  No What is the name of the medical supply agency? n/a Were you able to get the supplies/equipment? not applicable Do you have any questions related to the use of the equipment or supplies? n/a  Functional Questionnaire: (I = Independent and D = Dependent) ADLs: I with assistance  Bathing/Dressing- I  Meal Prep- I with assistance  Eating- I  Maintaining continence- I  Transferring/Ambulation- I with a cane  Managing Meds- I  Follow up appointments reviewed:   PCP Hospital f/u appt confirmed? Yes  Scheduled to see Dr. Etter Sjogren on 04/27/20 @ 1:20.  [facility] f/u appt confirmed? Yes  Scheduled to see Dr. Naaman Plummer on 04/28/20 @ 11:20.  Are transportation arrangements needed? No   If their condition worsens, is the pt aware to call PCP or go to the Emergency Dept.? Yes  Was the patient provided with contact information for the PCP's office or ED? Yes  Was to pt encouraged to call back with questions or concerns? Yes

## 2020-04-24 NOTE — Telephone Encounter (Signed)
Transition Care Management Unsuccessful Follow-up Telephone Call  Date of discharge and from where:  04/20/2020-[facility]  Attempts:  1st Attempt  Reason for unsuccessful TCM follow-up call:  Left voice message

## 2020-04-24 NOTE — Telephone Encounter (Signed)
Protonix sent into pharmacy

## 2020-04-24 NOTE — Telephone Encounter (Signed)
Transitional Care call-Patient    1. Are you/is patient experiencing any problems since coming home? No Are there any questions regarding any aspect of care? No 2. Are there any questions regarding medications administration/dosing? No Are meds being taken as prescribed? Yes Patient should review meds with caller to confirm 3. Have there been any falls? No 4. Has Home Health been to the house and/or have they contacted you? Yes-Outpatient If not, have you tried to contact them? Can we help you contact them? 5. Are bowels and bladder emptying properly? Yes Are there any unexpected incontinence issues? No If applicable, is patient following bowel/bladder programs? 6. Any fevers, problems with breathing, unexpected pain? No 7. Are there any skin problems or new areas of breakdown? No 8. Has the patient/family member arranged specialty MD follow up (ie cardiology/neurology/renal/surgical/etc)? Yes Can we help arrange? 9. Does the patient need any other services or support that we can help arrange? No 10. Are caregivers following through as expected in assisting the patient? Yes 11. Has the patient quit smoking, drinking alcohol, or using drugs as recommended? Yes  Appointment time 11:20 am, arrive time 11:00 am with [place]

## 2020-04-25 ENCOUNTER — Ambulatory Visit: Payer: Medicare Other | Attending: Physical Medicine and Rehabilitation | Admitting: Occupational Therapy

## 2020-04-25 ENCOUNTER — Encounter: Payer: Self-pay | Admitting: Occupational Therapy

## 2020-04-25 ENCOUNTER — Other Ambulatory Visit: Payer: Self-pay

## 2020-04-25 DIAGNOSIS — M542 Cervicalgia: Secondary | ICD-10-CM | POA: Diagnosis not present

## 2020-04-25 DIAGNOSIS — M79601 Pain in right arm: Secondary | ICD-10-CM | POA: Insufficient documentation

## 2020-04-25 DIAGNOSIS — Z9181 History of falling: Secondary | ICD-10-CM | POA: Insufficient documentation

## 2020-04-25 DIAGNOSIS — M545 Low back pain, unspecified: Secondary | ICD-10-CM | POA: Insufficient documentation

## 2020-04-25 DIAGNOSIS — R262 Difficulty in walking, not elsewhere classified: Secondary | ICD-10-CM | POA: Insufficient documentation

## 2020-04-25 DIAGNOSIS — M6281 Muscle weakness (generalized): Secondary | ICD-10-CM | POA: Insufficient documentation

## 2020-04-25 DIAGNOSIS — G8929 Other chronic pain: Secondary | ICD-10-CM | POA: Insufficient documentation

## 2020-04-25 DIAGNOSIS — R29898 Other symptoms and signs involving the musculoskeletal system: Secondary | ICD-10-CM | POA: Diagnosis not present
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
## 2020-04-27 ENCOUNTER — Ambulatory Visit (INDEPENDENT_AMBULATORY_CARE_PROVIDER_SITE_OTHER): Payer: Medicare Other | Admitting: Family Medicine

## 2020-04-27 ENCOUNTER — Ambulatory Visit: Payer: Medicare Other | Admitting: Occupational Therapy

## 2020-04-27 ENCOUNTER — Other Ambulatory Visit: Payer: Self-pay

## 2020-04-27 VITALS — BP 136/78 | HR 66 | Temp 97.7°F | Resp 18 | Wt 141.4 lb

## 2020-04-27 DIAGNOSIS — R29898 Other symptoms and signs involving the musculoskeletal system: Secondary | ICD-10-CM

## 2020-04-27 DIAGNOSIS — S42201A Unspecified fracture of upper end of right humerus, initial encounter for closed fracture: Secondary | ICD-10-CM | POA: Diagnosis not present

## 2020-04-27 DIAGNOSIS — M79601 Pain in right arm: Secondary | ICD-10-CM

## 2020-04-27 DIAGNOSIS — D649 Anemia, unspecified: Secondary | ICD-10-CM | POA: Diagnosis not present

## 2020-04-27 DIAGNOSIS — E876 Hypokalemia: Secondary | ICD-10-CM | POA: Diagnosis not present

## 2020-04-27 DIAGNOSIS — D62 Acute posthemorrhagic anemia: Secondary | ICD-10-CM

## 2020-04-27 DIAGNOSIS — M6281 Muscle weakness (generalized): Secondary | ICD-10-CM

## 2020-04-27 DIAGNOSIS — E039 Hypothyroidism, unspecified: Secondary | ICD-10-CM

## 2020-04-27 DIAGNOSIS — S069X4A Unspecified intracranial injury with loss of consciousness of 6 hours to 24 hours, initial encounter: Secondary | ICD-10-CM

## 2020-04-27 DIAGNOSIS — I10 Essential (primary) hypertension: Secondary | ICD-10-CM

## 2020-04-27 DIAGNOSIS — Z9181 History of falling: Secondary | ICD-10-CM
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
## 2020-04-27 NOTE — Patient Instructions (Signed)
Traumatic Brain Injury Traumatic brain injury (TBI) is an injury to the brain that results from:  A hard, direct blow to the head (closed injury).  An object penetrating the skull and entering the brain (open injury). Traumatic brain injury is also called a head injury or a concussion. TBI can be mild, moderate, or severe. What are the causes? Common causes of this condition include:  Falls.  Motor vehicle accidents.  Sports injuries.  Assaults. What increases the risk? You are more likely to develop this condition if you:  Are 75 years old or older.  Are a man.  Play contact sports, especially football, hockey, or soccer.  Are in the military.  Are a victim of violence.  Abuse drugs or alcohol.  Have had a previous TBI. What are the signs or symptoms? Symptoms may vary from person to person, and may include:  Loss of consciousness.  Headache.  Confusion.  Fatigue.  Changes in sleep.  Dizziness.  Mood or personality changes.  Memory problems.  Nausea or vomiting or both.  Seizures.  Clumsiness.  Slurred speech.  Depression and anxiety.  Anger.  Trouble concentrating, organizing, or making decisions.  Inability to control emotions or actions (impulse control).  Loss of or dulling of the senses, such as hearing, vision, and touch. This can include: ? Blurred vision. ? Ringing in your ears. How is this diagnosed? This condition may be diagnosed based on:  Medical history and physical exam.  Neurologic exam. This checks for brain and nervous system function, including your reflexes, memory, and coordination.  CT scan. Your TBI may be described as mild, moderate, or severe. How is this treated? Treatment depends on the severity of your brain injury and may include:  Breathing support (mechanical ventilation).  Blood pressure medicines.  Pain medicines.  Treatments to decrease the swelling in your brain.  Brain surgery. This may be  needed to: ? Remove a blood clot. ? Repair bleeding. ? Remove an object that has penetrated the brain, such as a skull fragment or a bullet. Treatment of TBI also includes:  Physical and mental rest.  Careful observation.  Medicine. You may be prescribed medicines to help with symptoms such as headaches, nausea, or difficulty sleeping.  Physical, occupational, and speech therapy.  Referral to a concussion clinic or rehabilitation center. Follow these instructions at home: Medicines  Take over-the-counter and prescription medicines only as told by your health care provider.  Do not take blood thinners (anticoagulants), aspirin, or other anti-inflammatory medicines such as ibuprofen or naproxen unless approved by your health care provider. Activity  Rest. Rest helps the brain to heal. Make sure you: ? Get plenty of sleep. Most adults should get 7-9 hours of sleep each night. ? Rest during the day. Take daytime naps or rest breaks when you feel tired.  Do not do high-risk activities that could cause a second concussion, such as riding a bike or playing sports. Having another concussion before the first one has healed can be dangerous.  Avoid a lot of visual stimulation. This includes work on the computer or phone, watching TV, and reading.  Ask your health care provider what kind of activities are safe for you. Your ability to react may be slower after a brain injury. Never do these activities if you are dizzy. Your health care provider will likely give you a plan for gradually returning to activities. General instructions  Do not drink alcohol.  Watch your symptoms and tell others to do the   same. Complications sometimes occur after a brain injury. Older adults with a brain injury may have a higher risk of serious complications.  Seek support from friends and family.  Keep all follow-up visits as directed by your health care provider. This is important. Contact a health care  provider if:  Your symptoms get worse or they do not improve.  You have new symptoms.  You have another injury. Get help right away if:  You have: ? Severe, persistent headaches that are not relieved by medicine. ? Weakness or numbness in any part of your body. ? Confusion. ? Slurred speech. ? Difficulty waking up. ? Nausea or persistent vomiting. ? A feeling like you are moving when you are not (vertigo). ? Seizures or you faint. ? Changes in your vision. ? Clear or bloody discharge from your nose or ears.  You cannot use your arms or legs normally. Summary  Traumatic brain injury happens when there is a hard, direct blow to the head or when an object penetrates the skull and enters the brain.  Traumatic brain injuries may be mild, moderate, or severe. Treatment depends on the severity of your injury.  Get help right away if you have a head injury and you develop seizures, confusion, vomiting, weakness in the arms or legs, slurred speech, and other symptoms.  Rest is one of the best treatments. Do not return to activity until your health care provider approves. This information is not intended to replace advice given to you by your health care provider. Make sure you discuss any questions you have with your health care provider. Document Revised: 06/25/2017 Document Reviewed: 04/07/2017 [employer]vier Patient Education  2021 [employer]  

## 2020-04-27 NOTE — Progress Notes (Signed)
Patient ID: Amanda [name], female    DOB: 11/28/1940  Age: 80 y.o. MRN: [id number]    Subjective:  Subjective  HPI Amanda [name] presents for f/u from hosp for fall through attic ceiling 26ft--- pt was unresponsive at the time and was intubated 2 days  She was d/c to rehab with abx for pneumonia --- she got out a week ago today and is now in outpt pt / ot at [place] [facility] F/u neuro surgeon and rehab dr coming up    Review of Systems  Constitutional: Negative for appetite change, diaphoresis, fatigue and unexpected weight change.  Eyes: Negative for pain, redness and visual disturbance.  Respiratory: Negative for cough, chest tightness, shortness of breath and wheezing.   Cardiovascular: Negative for chest pain, palpitations and leg swelling.  Endocrine: Negative for cold intolerance, heat intolerance, polydipsia, polyphagia and polyuria.  Genitourinary: Negative for difficulty urinating, dysuria and frequency.  Neurological: Negative for dizziness, light-headedness, numbness and headaches.    History Past Medical History:  Diagnosis Date  . Anemia   . Arthritis   . Bronchiectasis ([place])   . Cancer ([place])   . Cervicalgia   . Constipation, chronic   . Essential hypertension   . GERD (gastroesophageal reflux disease)    zantac  . Heart murmur   . History of blood transfusion [place]  . Hyperlipidemia   . Hypertension   . Hyperthyroidism   . Hypothyroid   . Hypothyroidism   . Lumbar burst fracture ([place])   . Lymphoproliferative disorder ([place])   . Macular degeneration 2013   Both eyes   . Macular degeneration, bilateral   . 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 Osteopenia   . Pneumonia   . Pneumonia due to COVID-19 virus 2021   Required hospitalization  . PONV (postoperative nausea and vomiting)    needs little anesthesia  . Shingles   . Shortness of breath    on exertion  . Spleen enlarged   . SUI (stress urinary incontinence, female)   . Urinary, incontinence,  stress female   . Wears glasses     She has a past surgical history that includes Tonsillectomy and adenoidectomy; Cesarean section (5170); Wisdom tooth extraction; Cataract extraction (2009, 2011); Tubal ligation; Dilation and curettage of uterus; Carpal tunnel release (1999); Colonoscopy; Hysteroscopy with D & C (01/07/2012); Lymph node biopsy (Left, 05/26/2018); IR IMAGING GUIDED PORT INSERTION (07/15/2018); ORIF ankle fracture (Left, 12/12/2018); Cesarean section; Tonsillectomy; Cataract extraction, bilateral; ORIF ankle fracture (Left, 12/12/2018); Breast excisional biopsy (Left, 1980); and ORIF ankle fracture (Left, 12/2018).   Her family history includes Breast cancer (age of onset: 67) in her mother; COPD in her paternal grandfather; Diabetes in her father; Hyperlipidemia in her brother; Hypertension in her father; Kidney failure in her father; Ovarian cancer in her mother; Prostate cancer in her father; Stroke in her maternal grandfather.She reports that she has never smoked. She has never used smokeless tobacco. She reports current alcohol use. She reports that she does not use drugs.  Current Outpatient Medications on File Prior to Visit  Medication Sig Dispense Refill  . acetaminophen (TYLENOL) 325 MG tablet Take 1-2 tablets (325-650 mg total) by mouth every 8 (eight) hours as needed for mild pain.    [name] albuterol (PROVENTIL) (2.5 MG/3ML) 0.083% nebulizer solution Take 2.5 mg by nebulization every 6 (six) hours as needed for wheezing or shortness of breath.    [name] amLODipine (NORVASC) 10 MG tablet Take 10 mg by  mouth daily.    [name] aspirin 81 MG chewable tablet Chew 1 tablet (81 mg total) by mouth daily.    [name] buPROPion (WELLBUTRIN XL) 150 MG 24 hr tablet Take 1 tablet (150 mg total) by mouth daily. 90 tablet 0  . levothyroxine (SYNTHROID) 125 MCG tablet Take 1 tablet (125 mcg total) by mouth daily before breakfast. 90 tablet 1  . methocarbamol (ROBAXIN) 500 MG tablet Take 1-2 tablets (500-1,000 mg  total) by mouth 3 (three) times daily. In a few days you can wean down to twice a day for a few days then once a day as needed 100 tablet 0  . Multiple Vitamins-Minerals (MACULAR HEALTH FORMULA) CAPS Take 1 capsule by mouth daily.    . pantoprazole (PROTONIX) 40 MG tablet Take 1 tablet (40 mg total) by mouth daily. 30 tablet 3  . potassium chloride SA (KLOR-CON) 20 MEQ tablet Take 1 tablet (20 mEq total) by mouth daily. 30 tablet 0  . senna-docusate (SENOKOT-S) 8.6-50 MG tablet Take 2 tablets by mouth at bedtime. 60 tablet 0   Current Facility-Administered Medications on File Prior to Visit  Medication Dose Route Frequency Provider Last Rate Last Admin  . diphenhydrAMINE (BENADRYL) capsule 50 mg  50 mg Oral Once Tish Men, MD      . heparin lock flush 100 unit/mL  500 Units Intracatheter Once PRN Tish Men, MD      . sodium chloride flush (NS) 0.9 % injection 10 mL  10 mL Intracatheter PRN Tish Men, MD         Objective:  Objective  Physical Exam Vitals and nursing note reviewed.  Constitutional:      Appearance: She is well-developed and well-nourished.  HENT:     Head: Normocephalic and atraumatic.  Eyes:     Extraocular Movements: EOM normal.     Conjunctiva/sclera: Conjunctivae normal.  Neck:     Thyroid: No thyromegaly.     Vascular: No carotid bruit or JVD.  Cardiovascular:     Rate and Rhythm: Normal rate and regular rhythm.     Heart sounds: Normal heart sounds. No murmur heard.   Pulmonary:     Effort: Pulmonary effort is normal. No respiratory distress.     Breath sounds: Normal breath sounds. No wheezing or rales.  Chest:     Chest wall: No tenderness.  Musculoskeletal:        General: No edema.     Cervical back: Normal range of motion and neck supple.  Neurological:     Mental Status: She is alert and oriented to person, place, and time.  Psychiatric:        Mood and Affect: Mood and affect normal.    BP 136/78 (BP Location: Left Arm, Patient Position:  Sitting, Cuff Size: Normal)   Pulse 66   Temp 97.7 F (36.5 C) (Oral)   Resp 18   Wt 141 lb 6.4 oz (64.1 kg)   SpO2 98%   BMI 27.62 kg/m  Wt Readings from Last 3 Encounters:  04/28/20 139 lb 9.6 oz (63.3 kg)  04/27/20 141 lb 6.4 oz (64.1 kg)  04/19/20 151 lb 3.8 oz (68.6 kg)     Lab Results  Component Value Date   WBC 5.7 04/27/2020   HGB 12.9 04/27/2020   HCT 39.6 04/27/2020   PLT 297.0 04/27/2020   GLUCOSE 86 04/27/2020   CHOL 225 (H) 04/27/2020   TRIG 218.0 (H) 04/27/2020   HDL 63.00 04/27/2020   LDLDIRECT 123.0 04/27/2020  LDLCALC 84 12/07/2019   ALT 11 04/27/2020   AST 12 04/27/2020   NA 140 04/27/2020   K 4.7 04/27/2020   CL 102 04/27/2020   CREATININE 0.90 04/27/2020   BUN 20 04/27/2020   CO2 29 04/27/2020   TSH 12.79 (H) 04/27/2020   INR 1.0 04/01/2020   HGBA1C 6.1 (H) 11/18/2014    No results found.   Assessment & Plan:  Plan  I have discontinued Ayse C. Kuklinski's famotidine. I am also having her maintain her aspirin, levothyroxine, buPROPion, Macular Health Formula, amLODipine, albuterol, senna-docusate, acetaminophen, methocarbamol, potassium chloride SA, pantoprazole, and ZTlido.  No orders of the defined types were placed in this encounter.   Problem List Items Addressed This Visit      Unprioritized   Acute blood loss anemia    Check labs today      Closed fracture of part of upper end of humerus    Healing F/u ortho      Essential hypertension    Well controlled, no changes to meds. Encouraged heart healthy diet such as the DASH diet and exercise as tolerated.       Hypokalemia - Primary    Recheck labs today      Relevant Orders   Comprehensive metabolic panel (Completed)   Comprehensive metabolic panel   Hypothyroidism   Relevant Orders   TSH (Completed)   Primary hypertension   Relevant Orders   TSH (Completed)   Lipid panel (Completed)   TBI (traumatic brain injury) ([place])    Pt has f/u with neuro        Other Visit  Diagnoses    Anemia, unspecified type       Relevant Orders   CBC with Differential/Platelet (Completed)   IBC + Ferritin (Completed)      Follow-up: Return if symptoms worsen or fail to improve.  [name], DO

## 2020-04-28 ENCOUNTER — Encounter: Payer: Self-pay | Admitting: Registered Nurse

## 2020-04-28 ENCOUNTER — Encounter: Payer: Medicare Other | Attending: Registered Nurse | Admitting: Registered Nurse

## 2020-04-28 ENCOUNTER — Other Ambulatory Visit: Payer: Self-pay

## 2020-04-28 VITALS — BP 143/83 | HR 83 | Temp 97.6°F | Ht 60.0 in | Wt 139.6 lb

## 2020-04-28 DIAGNOSIS — I10 Essential (primary) hypertension: Secondary | ICD-10-CM | POA: Insufficient documentation

## 2020-04-28 DIAGNOSIS — T07XXXA Unspecified multiple injuries, initial encounter: Secondary | ICD-10-CM | POA: Diagnosis not present

## 2020-04-28 LAB — CBC WITH DIFFERENTIAL/PLATELET
Basophils Absolute: 0 10*3/uL (ref 0.0–0.1)
Basophils Relative: 0.6 % (ref 0.0–3.0)
Eosinophils Absolute: 0.3 10*3/uL (ref 0.0–0.7)
Eosinophils Relative: 4.6 % (ref 0.0–5.0)
HCT: 39.6 % (ref 36.0–46.0)
Hemoglobin: 12.9 g/dL (ref 12.0–15.0)
Lymphocytes Relative: 30 % (ref 12.0–46.0)
Lymphs Abs: 1.7 10*3/uL (ref 0.7–4.0)
MCHC: 32.5 g/dL (ref 30.0–36.0)
MCV: 87.5 fl (ref 78.0–100.0)
Monocytes Absolute: 1 10*3/uL (ref 0.1–1.0)
Monocytes Relative: 17.9 % — ABNORMAL HIGH (ref 3.0–12.0)
Neutro Abs: 2.7 10*3/uL (ref 1.4–7.7)
Neutrophils Relative %: 46.9 % (ref 43.0–77.0)
Platelets: 297 10*3/uL (ref 150.0–400.0)
RBC: 4.53 Mil/uL (ref 3.87–5.11)
RDW: 18.9 % — ABNORMAL HIGH (ref 11.5–15.5)
WBC: 5.7 10*3/uL (ref 4.0–10.5)

## 2020-04-28 LAB — COMPREHENSIVE METABOLIC PANEL
ALT: 11 U/L (ref 0–35)
AST: 12 U/L (ref 0–37)
Albumin: 4.4 g/dL (ref 3.5–5.2)
Alkaline Phosphatase: 195 U/L — ABNORMAL HIGH (ref 39–117)
BUN: 20 mg/dL (ref 6–23)
CO2: 29 mEq/L (ref 19–32)
Calcium: 9.6 mg/dL (ref 8.4–10.5)
Chloride: 102 mEq/L (ref 96–112)
Creatinine, Ser: 0.9 mg/dL (ref 0.40–1.20)
GFR: 60.03 mL/min (ref >60.00)
Glucose, Bld: 86 mg/dL (ref 70–99)
Potassium: 4.7 mEq/L (ref 3.5–5.1)
Sodium: 140 mEq/L (ref 135–145)
Total Bilirubin: 0.4 mg/dL (ref 0.2–1.2)
Total Protein: 6.5 g/dL (ref 6.0–8.3)

## 2020-04-28 LAB — LIPID PANEL
Cholesterol: 225 mg/dL — ABNORMAL HIGH (ref 0–200)
HDL: 63 mg/dL (ref >39.00)
NonHDL: 161.92
Total CHOL/HDL Ratio: 4
Triglycerides: 218 mg/dL — ABNORMAL HIGH (ref 0.0–149.0)
VLDL: 43.6 mg/dL — ABNORMAL HIGH (ref 0.0–40.0)

## 2020-04-28 LAB — IBC + FERRITIN
Ferritin: 297.6 ng/mL — ABNORMAL HIGH (ref 10.0–291.0)
Iron: 61 ug/dL (ref 42–145)
Saturation Ratios: 15.1 % — ABNORMAL LOW (ref 20.0–50.0)
Transferrin: 289 mg/dL (ref 212.0–360.0)

## 2020-04-28 LAB — TSH: TSH: 12.79 u[IU]/mL — ABNORMAL HIGH (ref 0.35–4.50)

## 2020-04-28 LAB — LDL CHOLESTEROL, DIRECT: Direct LDL: 123 mg/dL

## 2020-04-28 NOTE — Progress Notes (Signed)
Subjective:    Patient ID: Amanda [name], female    DOB: [birth year], 80 y.o.   MRN: [id number]  HPI: Amanda [name] is a 80 y.o. female who is here for Transitional Care Visit for follow up of her TBI, Multiple Trauma, Closed Fracture of part of upper end of humerus and Essential Hypertension.  She presented to [facility] on [facility], after patient reported she fell 10 feet from her attic. Orthopedic and Neurosurgery was consulted.  DG: Chest:  IMPRESSION: 1. Endotracheal intubation, tube tip projecting over the lower trachea, approximately 3 cm above the carina. 2. Minimal retrocardiac atelectasis or scarring. No pneumothorax or other acute abnormality of the lungs. 3. No displaced rib fracture. 4. Fractures of the proximal right humerus, partially imaged.  DG Pelvis:  IMPRESSION: No acute osseous findings. Minimal hip degenerative changes bilaterally  CT Head WO Contrast: CT Cervical Spine IMPRESSION: 1. No intracranial trauma.  No CT evidence of acute infarction. 2. Small scalp hematoma over the LEFT parietal bone. 3. Transverse process fractures on the RIGHT from C2 through C4. Cannot exclude of RIGHT vertebral artery involvement. Consider CTA neck 4. No evidence of vertebral body fracture otherwise. No subluxation. 5. Nondisplaced fractures posteriorly of the LEFT first and second ribs. 6. Intubated patient.  CT Chest: CT Abdomen IMPRESSION: Chest Impression:  1. No evidence of thoracic aortic injury. 2. Nondisplaced fractures of the posterior LEFT first, second, and third ribs. 3. No pneumothorax. 4. Mild pleural thickening in the RIGHT lung apex related to the fractures. 5. Acute proximal RIGHT humerus fracture with overlying hematoma.  Abdomen / Pelvis Impression:  *No solid organ injury in the abdomen pelvis. *No pelvic fracture spine fracture. *Potential LEFT flank hematoma posteriorly  DG Right Shoulder:  IMPRESSION: Acute fracture of the proximal  right humerus with likely anterior Displacement CT Angio Head:  IMPRESSION: No evidence of traumatic arterial injury.  No hemodynamically significant stenosis in the neck.  Intracranial atherosclerosis involving anterior and posterior Circulations.  MR Brain:  IMPRESSION: Two small areas of diffusion abnormality in the left frontal parietal lobe in the precentral gyrus and in the motor cortex. Possible small areas of acute infarction or hemorrhage related to trauma. Possible shear injury.  Moderate white matter changes consistent with chronic ischemia.  Amanda [name] was admitted to inpatient Rehabilitation on 04/08/2020 and discharged home on 04/20/2020. She is receiving outpatient therapy at [facility]. She reports she has a good appetite.  Amanda [name] states she has pain in her neck, right shoulder and lower back. She rates her pain 7.   Husband in room.   Pain Inventory Average Pain 6 Pain Right Now 7 My pain is constant, dull, aching and throbs, spasms  LOCATION OF PAIN  Shoulders, back, ar,  BOWEL Number of stools per week: -5-6 Oral laxative use Yes  Type of laxative Senokot  Enema or suppository use No  History of colostomy No  Incontinent No   BLADDER Pads In and out cath, frequency  Able to self cath No  Bladder incontinence Yes  Frequent urination Yes  Leakage with coughing Yes  Difficulty starting stream No  Incomplete bladder emptying No    Mobility walk with assistance use a cane how many minutes can you walk? 5 7 mins ability to climb steps?  yes do you drive?  no Do you have any goals in this area?  yes  Function retired I need assistance with the following:  dressing, bathing, meal  prep, household duties and shopping Do you have any goals in this area?  yes  Neuro/Psych bladder control problems weakness trouble walking spasms confusion loss of taste or smell  Prior Studies Any changes since last visit?   no New patient  Physicians involved in your care Any changes since last visit?  no New Patient   Family History  Problem Relation Age of Onset  . Ovarian cancer Mother   . Breast cancer Mother 42  . Hypertension Father   . Prostate cancer Father   . Kidney failure Father   . Diabetes Father   . Hyperlipidemia Brother   . COPD Paternal Grandfather   . Stroke Maternal Grandfather   . Hypercalcemia Neg Hx    Social History   Socioeconomic History  . Marital status: Married    Spouse name: Not on file  . Number of children: Not on file  . Years of education: Not on file  . Highest education level: Not on file  Occupational History  . Not on file  Tobacco Use  . Smoking status: Never Smoker  . Smokeless tobacco: Never Used  Vaping Use  . Vaping Use: Never used  Substance and Sexual Activity  . Alcohol use: Yes    Comment: RARE  . Drug use: Never  . Sexual activity: Never    Birth control/protection: Post-menopausal  Other Topics Concern  . Not on file  Social History Narrative   ** Merged History Encounter **       ** Merged History Encounter **       Social Determinants of Health   Financial Resource Strain: Low Risk   . Difficulty of Paying Living Expenses: Not very hard  Food Insecurity: Not on file  Transportation Needs: Not on file  Physical Activity: Not on file  Stress: Not on file  Social Connections: Not on file   Past Surgical History:  Procedure Laterality Date  . BREAST EXCISIONAL BIOPSY Left 1980  . CARPAL TUNNEL RELEASE  1999  . CATARACT EXTRACTION  2009, 2011   BOTH EYES  . CATARACT EXTRACTION, BILATERAL    . [place]  . CESAREAN SECTION    . COLONOSCOPY      Dr Cristina Gong  . DILATION AND CURETTAGE OF UTERUS     X2  . HYSTEROSCOPY WITH D & C  01/07/2012   Procedure: DILATATION AND CURETTAGE /HYSTEROSCOPY;  Surgeon: Terrance Mass, MD;  Location: [facility];  Service: Gynecology;  Laterality: N/A;  intrauterine foley catheter  for tamponode   . IR IMAGING GUIDED PORT INSERTION  07/15/2018  . LYMPH NODE BIOPSY Left 05/26/2018   Procedure: LEFT AXILLARY LYMPH NODE BIOPSY;  Surgeon: Fanny Skates, MD;  Location: [facility];  Service: General;  Laterality: Left;  . ORIF ANKLE FRACTURE Left 12/12/2018  . ORIF ANKLE FRACTURE Left 12/12/2018   Procedure: OPEN REDUCTION INTERNAL FIXATION (ORIF) ANKLE FRACTURE;  Surgeon: Meredith Pel, MD;  Location: [facility];  Service: Orthopedics;  Laterality: Left;  . ORIF ANKLE FRACTURE Left 12/2018  . TONSILLECTOMY    . TONSILLECTOMY AND ADENOIDECTOMY    . TUBAL LIGATION     BY LAPAROSCOPY  . WISDOM TOOTH EXTRACTION     Past Medical History:  Diagnosis Date  . Anemia   . Arthritis   . Bronchiectasis ([place])   . Cancer ([place])   . Cervicalgia   . Constipation, chronic   . Essential hypertension   . GERD (gastroesophageal reflux disease)  zantac  . Heart murmur   . History of blood transfusion [place]  . Hyperlipidemia   . Hypertension   . Hyperthyroidism   . Hypothyroid   . Hypothyroidism   . Lumbar burst fracture ([place])   . Lymphoproliferative disorder ([place])   . Macular degeneration 2013   Both eyes   . Macular degeneration, bilateral   . 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 Osteopenia   . Pneumonia   . Pneumonia due to COVID-19 virus 2021   Required hospitalization  . PONV (postoperative nausea and vomiting)    needs little anesthesia  . Shingles   . Shortness of breath    on exertion  . Spleen enlarged   . SUI (stress urinary incontinence, female)   . Urinary, incontinence, stress female   . Wears glasses    BP (!) 143/83   Pulse 83   Temp 97.6 F (36.4 C)   Ht 5' (1.524 m)   Wt 139 lb 9.6 oz (63.3 kg)   SpO2 98%   BMI 27.26 kg/m   Opioid Risk Score:   Fall Risk Score:  `1  Depression screen PHQ 2/9  Depression screen [facility] 2/9 12/08/2019 06/16/2019 05/26/2019 01/06/2019  Decreased Interest 0 0 0 0  Down, Depressed, Hopeless 0 0 0 0  PHQ - 2  Score 0 0 0 0  Altered sleeping 0 - - -  Tired, decreased energy 3 - - -  Change in appetite 0 - - -  Feeling bad or failure about yourself  0 - - -  Trouble concentrating 0 - - -  Moving slowly or fidgety/restless 1 - - -  Suicidal thoughts 0 - - -  PHQ-9 Score 4 - - -  Some recent data might be hidden   Review of Systems  Genitourinary:       Incontinence   Musculoskeletal: Positive for back pain, gait problem and neck pain.       Pain in shoulders,   All other systems reviewed and are negative.      Objective:   Physical Exam Vitals and nursing note reviewed.  Constitutional:      Appearance: Normal appearance.  Cardiovascular:     Rate and Rhythm: Normal rate and regular rhythm.     Pulses: Normal pulses.     Heart sounds: Normal heart sounds.  Pulmonary:     Effort: Pulmonary effort is normal.     Breath sounds: Normal breath sounds.  Musculoskeletal:     Cervical back: Normal range of motion and neck supple.     Comments: Normal Muscle Bulk and Muscle Testing Reveals:  Upper Extremities: Right : Decreased ROM and Muscle Strength 3/5 Wearing Right Shoulder Sling Left Upper Extremity: Full ROM and Muscle Strength 5/5 Lower Extremities: Full ROM and Muscle Strength 5/5 Arises from Table with ease Narrow Based  Gait   Skin:    General: Skin is warm and dry.  Neurological:     Mental Status: She is alert and oriented to person, place, and time.  Psychiatric:        Mood and Affect: Mood normal.        Behavior: Behavior normal.           Assessment & Plan:  1. TBI: She was instructed to schedule HFU with Neurosurgery, she verbalizes understanding.  2. Multiple Trauma: Continue Outpatient Therapy. Continue to Monitor.  3. Closed Fracture of part of upper end of humerus: She was instructed  to call Dr Doreatha Martin office to schedule HFU appointment, she verbalizes understanding.  4. Essential Hypertension.: Continue current medication regimen. PCP Following.  F/U  with Dr Naaman Plummer in 4- 6 weeks

## 2020-04-28 NOTE — Patient Instructions (Signed)
Call and Schedule the following appointments  Call Dr. Katha Hamming : For Follow Up Shoulder [place], [place] [address] [phone]   Call Dr. Zada Finders : Follow up on Neck [address]  [place], [place] [address] [phone]

## 2020-04-30 ENCOUNTER — Encounter: Payer: Self-pay | Admitting: Family Medicine

## 2020-04-30 NOTE — Assessment & Plan Note (Signed)
Healing F/u ortho

## 2020-04-30 NOTE — Assessment & Plan Note (Signed)
Check labs today.

## 2020-04-30 NOTE — Assessment & Plan Note (Signed)
Recheck labs today. 

## 2020-04-30 NOTE — Assessment & Plan Note (Signed)
Pt has f/u with neuro

## 2020-04-30 NOTE — Assessment & Plan Note (Signed)
Well controlled, no changes to meds. Encouraged heart healthy diet such as the DASH diet and exercise as tolerated.  °

## 2020-05-01 ENCOUNTER — Other Ambulatory Visit: Payer: Self-pay

## 2020-05-01 ENCOUNTER — Ambulatory Visit: Payer: Medicare Other | Admitting: Physical Therapy

## 2020-05-01 ENCOUNTER — Ambulatory Visit: Payer: Medicare Other | Admitting: Occupational Therapy

## 2020-05-01 ENCOUNTER — Encounter: Payer: Self-pay | Admitting: Physical Therapy

## 2020-05-01 ENCOUNTER — Encounter: Payer: Self-pay | Admitting: Occupational Therapy

## 2020-05-01 DIAGNOSIS — M6281 Muscle weakness (generalized): Secondary | ICD-10-CM

## 2020-05-01 DIAGNOSIS — M79601 Pain in right arm: Secondary | ICD-10-CM

## 2020-05-01 DIAGNOSIS — Z9181 History of falling: Secondary | ICD-10-CM

## 2020-05-01 DIAGNOSIS — G8929 Other chronic pain: Secondary | ICD-10-CM

## 2020-05-01 DIAGNOSIS — M545 Low back pain, unspecified: Secondary | ICD-10-CM

## 2020-05-01 DIAGNOSIS — R29898 Other symptoms and signs involving the musculoskeletal system: Secondary | ICD-10-CM

## 2020-05-01 DIAGNOSIS — M542 Cervicalgia: Secondary | ICD-10-CM

## 2020-05-01 DIAGNOSIS — R262 Difficulty in walking, not elsewhere classified: Secondary | ICD-10-CM
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
## 2020-05-01 NOTE — Patient Instructions (Signed)
Access Code: [email or web address] URL: [email or web address] Date: 05/01/2020 Prepared by: Amador Cunas  Exercises Supine Bridge - 1 x daily - 7 x weekly - 3 sets - 10 reps Supine Lower Trunk Rotation - 1 x daily - 7 x weekly - 3 sets - 10 reps - 5 sec hold Sit to Stand - 1 x daily - 7 x weekly - 3 sets - 10 reps Seated Hip Abduction with Resistance - 1 x daily - 7 x weekly - 3 sets - 10 reps Seated March - 1 x daily - 7 x weekly - 3 sets - 10 reps Seated Long Arc Quad - 1 x daily - 7 x weekly - 3 sets - 10 reps

## 2020-05-02 DIAGNOSIS — S42201D Unspecified fracture of upper end of right humerus, subsequent encounter for fracture with routine healing: Secondary | ICD-10-CM | POA: Diagnosis not present

## 2020-05-04 ENCOUNTER — Ambulatory Visit: Payer: Medicare Other | Attending: Physical Medicine and Rehabilitation | Admitting: Occupational Therapy

## 2020-05-04 ENCOUNTER — Ambulatory Visit: Payer: Medicare Other | Admitting: Physical Therapy

## 2020-05-04 ENCOUNTER — Encounter: Payer: Self-pay | Admitting: Occupational Therapy

## 2020-05-04 ENCOUNTER — Other Ambulatory Visit: Payer: Self-pay

## 2020-05-04 ENCOUNTER — Encounter: Payer: Self-pay | Admitting: Physical Therapy

## 2020-05-04 DIAGNOSIS — M6281 Muscle weakness (generalized): Secondary | ICD-10-CM | POA: Diagnosis not present

## 2020-05-04 DIAGNOSIS — M79601 Pain in right arm: Secondary | ICD-10-CM | POA: Insufficient documentation

## 2020-05-04 DIAGNOSIS — M542 Cervicalgia: Secondary | ICD-10-CM | POA: Diagnosis not present

## 2020-05-04 DIAGNOSIS — R252 Cramp and spasm: Secondary | ICD-10-CM | POA: Insufficient documentation

## 2020-05-04 DIAGNOSIS — Z9181 History of falling: Secondary | ICD-10-CM | POA: Diagnosis not present

## 2020-05-04 DIAGNOSIS — M545 Low back pain, unspecified: Secondary | ICD-10-CM | POA: Diagnosis not present

## 2020-05-04 DIAGNOSIS — G8929 Other chronic pain: Secondary | ICD-10-CM | POA: Diagnosis not present

## 2020-05-04 DIAGNOSIS — T07XXXA Unspecified multiple injuries, initial encounter: Secondary | ICD-10-CM | POA: Insufficient documentation

## 2020-05-04 DIAGNOSIS — R262 Difficulty in walking, not elsewhere classified: Secondary | ICD-10-CM | POA: Diagnosis not present

## 2020-05-04 DIAGNOSIS — R29898 Other symptoms and signs involving the musculoskeletal system: Secondary | ICD-10-CM | POA: Diagnosis not present
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
## 2020-05-08 DIAGNOSIS — M5030 Other cervical disc degeneration, unspecified cervical region: Secondary | ICD-10-CM | POA: Diagnosis not present

## 2020-05-08 DIAGNOSIS — R030 Elevated blood-pressure reading, without diagnosis of hypertension: Secondary | ICD-10-CM | POA: Diagnosis not present

## 2020-05-08 DIAGNOSIS — Z6825 Body mass index (BMI) 25.0-25.9, adult: Secondary | ICD-10-CM | POA: Diagnosis not present

## 2020-05-09 ENCOUNTER — Ambulatory Visit: Payer: Medicare Other | Admitting: Occupational Therapy

## 2020-05-09 ENCOUNTER — Other Ambulatory Visit: Payer: Self-pay

## 2020-05-09 DIAGNOSIS — M79601 Pain in right arm: Secondary | ICD-10-CM

## 2020-05-09 DIAGNOSIS — R29898 Other symptoms and signs involving the musculoskeletal system: Secondary | ICD-10-CM

## 2020-05-09 DIAGNOSIS — Z9181 History of falling: Secondary | ICD-10-CM

## 2020-05-09 DIAGNOSIS — M6281 Muscle weakness (generalized): Secondary | ICD-10-CM
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
## 2020-05-11 ENCOUNTER — Ambulatory Visit: Payer: Medicare Other

## 2020-05-11 ENCOUNTER — Other Ambulatory Visit: Payer: Self-pay

## 2020-05-11 ENCOUNTER — Ambulatory Visit: Payer: Medicare Other | Admitting: Occupational Therapy

## 2020-05-11 ENCOUNTER — Encounter: Payer: Self-pay | Admitting: Occupational Therapy

## 2020-05-11 DIAGNOSIS — T07XXXA Unspecified multiple injuries, initial encounter: Secondary | ICD-10-CM

## 2020-05-11 DIAGNOSIS — Z9181 History of falling: Secondary | ICD-10-CM

## 2020-05-11 DIAGNOSIS — G8929 Other chronic pain: Secondary | ICD-10-CM

## 2020-05-11 DIAGNOSIS — M79601 Pain in right arm: Secondary | ICD-10-CM | POA: Diagnosis not present

## 2020-05-11 DIAGNOSIS — M545 Low back pain, unspecified: Secondary | ICD-10-CM

## 2020-05-11 DIAGNOSIS — R29898 Other symptoms and signs involving the musculoskeletal system: Secondary | ICD-10-CM

## 2020-05-11 DIAGNOSIS — M6281 Muscle weakness (generalized): Secondary | ICD-10-CM

## 2020-05-11 DIAGNOSIS — R262 Difficulty in walking, not elsewhere classified: Secondary | ICD-10-CM
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
## 2020-05-22 ENCOUNTER — Other Ambulatory Visit: Payer: Self-pay

## 2020-05-22 ENCOUNTER — Encounter: Payer: Self-pay | Admitting: Occupational Therapy

## 2020-05-22 ENCOUNTER — Ambulatory Visit: Payer: Medicare Other | Admitting: Occupational Therapy

## 2020-05-22 ENCOUNTER — Ambulatory Visit: Payer: Medicare Other | Admitting: Physical Therapy

## 2020-05-22 ENCOUNTER — Encounter: Payer: Self-pay | Admitting: Physical Therapy

## 2020-05-22 DIAGNOSIS — M6281 Muscle weakness (generalized): Secondary | ICD-10-CM

## 2020-05-22 DIAGNOSIS — M79601 Pain in right arm: Secondary | ICD-10-CM

## 2020-05-22 DIAGNOSIS — Z9181 History of falling: Secondary | ICD-10-CM

## 2020-05-22 DIAGNOSIS — R262 Difficulty in walking, not elsewhere classified: Secondary | ICD-10-CM

## 2020-05-22 DIAGNOSIS — R29898 Other symptoms and signs involving the musculoskeletal system: Secondary | ICD-10-CM

## 2020-05-22 DIAGNOSIS — G8929 Other chronic pain: Secondary | ICD-10-CM

## 2020-05-22 DIAGNOSIS — M542 Cervicalgia: Secondary | ICD-10-CM

## 2020-05-22 DIAGNOSIS — M545 Low back pain, unspecified: Secondary | ICD-10-CM
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
## 2020-05-22 NOTE — Patient Instructions (Signed)
Access Code: [email or web address] URL: [email or web address] Date: 05/22/2020 Prepared by: Merleen Nicely, OTR/L  Exercises: - Seated Shoulder Flexion Towel Slide at Table Top - 2-3 x daily - 7 x weekly - 1 sets - 15 reps - Seated Chest Press with Bar - 2-3 x daily - 7 x weekly - 2 sets - 10 reps - Seated Shoulder External Rotation AAROM with Dowel - 2-3 x daily - 7 x weekly - 2 sets - 10 reps - Seated Shoulder Abduction AAROM with Dowel - 2-3 x daily - 7 x weekly - 2 sets - 10 reps

## 2020-05-24 ENCOUNTER — Ambulatory Visit: Payer: Medicare Other | Admitting: Physical Therapy

## 2020-05-24 ENCOUNTER — Encounter: Payer: Self-pay | Admitting: Physical Therapy

## 2020-05-24 ENCOUNTER — Other Ambulatory Visit: Payer: Self-pay

## 2020-05-24 ENCOUNTER — Ambulatory Visit: Payer: Medicare Other | Admitting: Occupational Therapy

## 2020-05-24 ENCOUNTER — Encounter: Payer: Self-pay | Admitting: Occupational Therapy

## 2020-05-24 DIAGNOSIS — M79601 Pain in right arm: Secondary | ICD-10-CM | POA: Diagnosis not present

## 2020-05-24 DIAGNOSIS — M6281 Muscle weakness (generalized): Secondary | ICD-10-CM

## 2020-05-24 DIAGNOSIS — M542 Cervicalgia: Secondary | ICD-10-CM

## 2020-05-24 DIAGNOSIS — R252 Cramp and spasm: Secondary | ICD-10-CM

## 2020-05-24 DIAGNOSIS — Z9181 History of falling: Secondary | ICD-10-CM

## 2020-05-24 DIAGNOSIS — R29898 Other symptoms and signs involving the musculoskeletal system: Secondary | ICD-10-CM

## 2020-05-24 DIAGNOSIS — G8929 Other chronic pain: Secondary | ICD-10-CM

## 2020-05-24 DIAGNOSIS — R262 Difficulty in walking, not elsewhere classified: Secondary | ICD-10-CM
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
## 2020-05-29 ENCOUNTER — Ambulatory Visit: Payer: Medicare Other | Admitting: Occupational Therapy

## 2020-05-29 ENCOUNTER — Other Ambulatory Visit: Payer: Self-pay

## 2020-05-29 ENCOUNTER — Encounter: Payer: Self-pay | Admitting: Occupational Therapy

## 2020-05-29 DIAGNOSIS — M79601 Pain in right arm: Secondary | ICD-10-CM

## 2020-05-29 DIAGNOSIS — R29898 Other symptoms and signs involving the musculoskeletal system: Secondary | ICD-10-CM

## 2020-05-29 DIAGNOSIS — M6281 Muscle weakness (generalized): Secondary | ICD-10-CM

## 2020-05-29 DIAGNOSIS — S42201D Unspecified fracture of upper end of right humerus, subsequent encounter for fracture with routine healing: Secondary | ICD-10-CM | POA: Diagnosis not present

## 2020-05-29 DIAGNOSIS — Z9181 History of falling: Secondary | ICD-10-CM

## 2020-05-30 ENCOUNTER — Inpatient Hospital Stay: Payer: Medicare Other | Attending: Hematology & Oncology

## 2020-05-30 ENCOUNTER — Inpatient Hospital Stay (HOSPITAL_BASED_OUTPATIENT_CLINIC_OR_DEPARTMENT_OTHER): Payer: Medicare Other | Admitting: Hematology & Oncology

## 2020-05-30 ENCOUNTER — Telehealth: Payer: Self-pay

## 2020-05-30 ENCOUNTER — Inpatient Hospital Stay: Payer: Medicare Other

## 2020-05-30 ENCOUNTER — Encounter: Payer: Self-pay | Admitting: Hematology & Oncology

## 2020-05-30 VITALS — BP 162/75 | HR 65 | Temp 97.8°F | Resp 17 | Wt 142.4 lb

## 2020-05-30 DIAGNOSIS — C8330 Diffuse large B-cell lymphoma, unspecified site: Secondary | ICD-10-CM | POA: Insufficient documentation

## 2020-05-30 DIAGNOSIS — Z79899 Other long term (current) drug therapy: Secondary | ICD-10-CM | POA: Diagnosis not present

## 2020-05-30 DIAGNOSIS — C8580 Other specified types of non-Hodgkin lymphoma, unspecified site: Secondary | ICD-10-CM

## 2020-05-30 DIAGNOSIS — Z5112 Encounter for antineoplastic immunotherapy: Secondary | ICD-10-CM | POA: Diagnosis not present

## 2020-05-30 DIAGNOSIS — Z881 Allergy status to other antibiotic agents status: Secondary | ICD-10-CM | POA: Diagnosis not present

## 2020-05-30 DIAGNOSIS — Z888 Allergy status to other drugs, medicaments and biological substances status: Secondary | ICD-10-CM | POA: Insufficient documentation

## 2020-05-30 LAB — CMP (CANCER CENTER ONLY)
ALT: 16 U/L (ref 0–44)
AST: 18 U/L (ref 15–41)
Albumin: 4.2 g/dL (ref 3.5–5.0)
Alkaline Phosphatase: 101 U/L (ref 38–126)
Anion gap: 7 (ref 5–15)
BUN: 14 mg/dL (ref 8–23)
CO2: 28 mmol/L (ref 22–32)
Calcium: 9.5 mg/dL (ref 8.9–10.3)
Chloride: 102 mmol/L (ref 98–111)
Creatinine: 0.87 mg/dL (ref 0.44–1.00)
GFR, Estimated: >60 mL/min (ref >60)
Glucose, Bld: 93 mg/dL (ref 70–99)
Potassium: 4.4 mmol/L (ref 3.5–5.1)
Sodium: 137 mmol/L (ref 135–145)
Total Bilirubin: 0.3 mg/dL (ref 0.3–1.2)
Total Protein: 6.2 g/dL — ABNORMAL LOW (ref 6.5–8.1)

## 2020-05-30 LAB — CBC WITH DIFFERENTIAL (CANCER CENTER ONLY)
Abs Immature Granulocytes: 0.02 10*3/uL (ref 0.00–0.07)
Basophils Absolute: 0.1 10*3/uL (ref 0.0–0.1)
Basophils Relative: 1 %
Eosinophils Absolute: 0.4 10*3/uL (ref 0.0–0.5)
Eosinophils Relative: 5 %
HCT: 38.6 % (ref 36.0–46.0)
Hemoglobin: 12.7 g/dL (ref 12.0–15.0)
Immature Granulocytes: 0 %
Lymphocytes Relative: 26 %
Lymphs Abs: 2 10*3/uL (ref 0.7–4.0)
MCH: 28.3 pg (ref 26.0–34.0)
MCHC: 32.9 g/dL (ref 30.0–36.0)
MCV: 86 fL (ref 80.0–100.0)
Monocytes Absolute: 1 10*3/uL (ref 0.1–1.0)
Monocytes Relative: 13 %
Neutro Abs: 4 10*3/uL (ref 1.7–7.7)
Neutrophils Relative %: 55 %
Platelet Count: 272 10*3/uL (ref 150–400)
RBC: 4.49 MIL/uL (ref 3.87–5.11)
RDW: 14.6 % (ref 11.5–15.5)
WBC Count: 7.4 10*3/uL (ref 4.0–10.5)
nRBC: 0 % (ref 0.0–0.2)

## 2020-05-30 LAB — LACTATE DEHYDROGENASE: LDH: 92 U/L — ABNORMAL LOW (ref 98–192)

## 2020-05-30 MED ORDER — SODIUM CHLORIDE 0.9% FLUSH
10.0000 mL | INTRAVENOUS | Status: DC | PRN
  Administered 2020-05-30: 10 mL
  Filled 2020-05-30: qty 10

## 2020-05-30 MED ORDER — DIPHENHYDRAMINE HCL 25 MG PO CAPS
50.0000 mg | ORAL_CAPSULE | Freq: Once | ORAL | Status: AC
  Administered 2020-05-30: 50 mg via ORAL

## 2020-05-30 MED ORDER — SODIUM CHLORIDE 0.9 % IV SOLN
INTRAVENOUS | Status: DC
  Filled 2020-05-30: qty 250

## 2020-05-30 MED ORDER — HEPARIN SOD (PORK) LOCK FLUSH 100 UNIT/ML IV SOLN
500.0000 [IU] | Freq: Once | INTRAVENOUS | Status: AC
  Administered 2020-05-30: 500 [IU] via INTRAVENOUS
  Filled 2020-05-30: qty 5

## 2020-05-30 MED ORDER — SODIUM CHLORIDE 0.9 % IV SOLN
Freq: Once | INTRAVENOUS | Status: DC
  Filled 2020-05-30: qty 250

## 2020-05-30 MED ORDER — SODIUM CHLORIDE 0.9 % IV SOLN
375.0000 mg/m2 | Freq: Once | INTRAVENOUS | Status: AC
  Administered 2020-05-30: 600 mg via INTRAVENOUS
  Filled 2020-05-30: qty 50

## 2020-05-30 MED ORDER — ACETAMINOPHEN 325 MG PO TABS: 650.0000 mg | ORAL_TABLET | Freq: Once | ORAL | Status: DC

## 2020-05-30 MED ORDER — DIPHENHYDRAMINE HCL 25 MG PO CAPS
ORAL_CAPSULE | ORAL | Status: AC
  Filled 2020-05-30: qty 2

## 2020-05-30 MED ORDER — ACETAMINOPHEN 325 MG PO TABS
ORAL_TABLET | ORAL | Status: AC
  Filled 2020-05-30: qty 2

## 2020-05-30 NOTE — Progress Notes (Signed)
Hematology and Oncology Follow Up Visit  Amanda [name] [id number] 04/12/1940 80 y.o. 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  She has a remarkable story to tell.  She fell from her attic back in early February.  She broke her right humerus.  She has some fractures on her cervical spine.  She had to be intubated because of respiratory depression.  She was intubated for several days.  She eventually recovered and went to inpatient rehab at [facility].  I must say that she looks incredibly well.  She seems to be recovering quite nicely.  I am just absolutely amazed that she did not die from this fall or break her neck to become paralyzed.  She seems to be managing.  Obviously, she has great family support.  She is incredibly tough lady.  She is trying to stay active right now.  She has had no problems with appetite.  She has had no nausea or vomiting.  There is been no change in bowel or bladder habits.  She is taking some physical therapy for her right shoulder.  Overall, I would say performance status is ECOG 1.     Medications:  Current Outpatient Medications:  .  acetaminophen (TYLENOL) 325 MG tablet, Take 1-2 tablets (325-650 mg total) by mouth every 8 (eight) hours as needed for mild pain., Disp: , Rfl:  .  amLODipine (NORVASC) 10 MG tablet, Take 10 mg by mouth daily., Disp: , Rfl:  .  aspirin 81 MG chewable tablet, Chew 1 tablet (81 mg total) by mouth daily., Disp:  , Rfl:  .  buPROPion (WELLBUTRIN XL) 150 MG 24 hr tablet, Take 1 tablet (150 mg total) by mouth daily., Disp: 90 tablet, Rfl: 0 .  levothyroxine (SYNTHROID) 125 MCG tablet, Take 1 tablet (125 mcg total) by mouth daily before breakfast., Disp: 90 tablet, Rfl: 1 .  lidocaine-prilocaine  (EMLA) cream, Apply 1 application topically as needed., Disp: , Rfl:  .  Multiple Vitamins-Minerals (MACULAR HEALTH FORMULA) CAPS, Take 1 capsule by mouth daily., Disp: , Rfl:  .  pantoprazole (PROTONIX) 40 MG tablet, Take 1 tablet (40 mg total) by mouth daily., Disp: 30 tablet, Rfl: 3 .  senna-docusate (SENOKOT-S) 8.6-50 MG tablet, Take 2 tablets by mouth at bedtime., Disp: 60 tablet, Rfl: 0 .  albuterol (PROVENTIL) (2.5 MG/3ML) 0.083% nebulizer solution, Take 2.5 mg by nebulization every 6 (six) hours as needed for wheezing or shortness of breath., Disp: , Rfl:  .  Lidocaine (ZTLIDO) 1.8 % PTCH, Apply topically. (Patient not taking: Reported on 05/30/2020), Disp: , Rfl:  .  methocarbamol (ROBAXIN) 500 MG tablet, Take 1-2 tablets (500-1,000 mg total) by mouth 3 (three) times daily. In a few days you can wean down to twice a day for a few days then once a day as needed, Disp: 100 tablet, Rfl: 0 .  potassium chloride SA (KLOR-CON) 20 MEQ tablet, Take 1 tablet (20 mEq total) by mouth daily., Disp: 30 tablet, Rfl: 0 No current facility-administered medications for this visit.  Facility-Administered Medications Ordered in Other Visits:  .  diphenhydrAMINE (BENADRYL) capsule 50 mg, 50 mg, Oral, Once, Tish Men, MD .  heparin lock flush 100 unit/mL, 500 Units, Intracatheter, Once  PRN, Tish Men, MD .  sodium chloride flush (NS) 0.9 % injection 10 mL, 10 mL, Intracatheter, PRN, Tish Men, MD  Allergies:  Allergies  Allergen Reactions  . Phenergan [Promethazine Hcl] Other (See Comments)    Jerking/agitation  . Preservision Areds 2 [Multiple Vitamins-Minerals] Nausea Only  . Levaquin [Levofloxacin] Nausea And Vomiting  . Pravastatin Nausea And Vomiting  . Preservision Areds 2 [Multiple Vitamins-Minerals] Nausea Only  . Promethazine Other (See Comments)    "Agitation and jerking"  . Levofloxacin Nausea And Vomiting  . Pravastatin Nausea And Vomiting    Past Medical History, Surgical history,  Social history, and Family History were reviewed and updated.  Review of Systems: Review of Systems  Constitutional: Negative.   HENT:  Negative.   Eyes: Negative.   Respiratory: Negative.   Cardiovascular: Negative.   Gastrointestinal: Negative.   Endocrine: Negative.   Genitourinary: Negative.    Musculoskeletal: Negative.   Skin: Negative.   Neurological: Negative.   Hematological: Negative.   Psychiatric/Behavioral: Negative.     Physical Exam:  weight is 142 lb 6.4 oz (64.6 kg). Her oral temperature is 97.8 F (36.6 C). Her blood pressure is 162/75 (abnormal) and her pulse is 65. Her respiration is 17 and oxygen saturation is 98%.   Wt Readings from Last 3 Encounters:  05/30/20 142 lb 6.4 oz (64.6 kg)  04/28/20 139 lb 9.6 oz (63.3 kg)  04/27/20 141 lb 6.4 oz (64.1 kg)    Physical Exam Vitals reviewed.  HENT:     Head: Normocephalic and atraumatic.  Eyes:     Pupils: Pupils are equal, round, and reactive to light.  Cardiovascular:     Rate and Rhythm: Normal rate and regular rhythm.     Heart sounds: Normal heart sounds.  Pulmonary:     Effort: Pulmonary effort is normal.     Breath sounds: Normal breath sounds.  Abdominal:     General: Bowel sounds are normal.     Palpations: Abdomen is soft.  Musculoskeletal:        General: No tenderness or deformity. Normal range of motion.     Cervical back: Normal range of motion.  Lymphadenopathy:     Cervical: No cervical adenopathy.  Skin:    General: Skin is warm and dry.     Findings: No erythema or rash.  Neurological:     Mental Status: She is alert and oriented to person, place, and time.  Psychiatric:        Behavior: Behavior normal.        Thought Content: Thought content normal.        Judgment: Judgment normal.      Lab Results  Component Value Date   WBC 7.4 05/30/2020   HGB 12.7 05/30/2020   HCT 38.6 05/30/2020   MCV 86.0 05/30/2020   PLT 272 05/30/2020     Chemistry      Component Value  Date/Time   NA 140 04/27/2020 1401   NA 146 (H) 05/26/2019 1108   K 4.7 04/27/2020 1401   CL 102 04/27/2020 1401   CO2 29 04/27/2020 1401   BUN 20 04/27/2020 1401   BUN 17 05/26/2019 1108   CREATININE 0.90 04/27/2020 1401   CREATININE 0.96 02/29/2020 1030   CREATININE 0.82 12/07/2019 1429   GLU 91 06/23/2018 0000      Component Value Date/Time   CALCIUM 9.6 04/27/2020 1401   CALCIUM 12.3 (H) 06/16/2018 1639   ALKPHOS 195 (H) 04/27/2020 1401  AST 12 04/27/2020 1401   AST 11 (L) 02/29/2020 1030   ALT 11 04/27/2020 1401   ALT 10 02/29/2020 1030   BILITOT 0.4 04/27/2020 1401   BILITOT 0.4 02/29/2020 1030      Impression and Plan: Ms. Holshouser is a very charming 80 year old white female.  She has a transformed marginal zone lymphoma.  Again, I am absolutely shocked as to what is happened to her.  Again she is recovered incredibly well concerning what she went through.  Hopefully, she will have some more function of her right arm.  We will continue her on treatment.  She will get her Rituxan today.  I still want to make sure we go every 3 months with Rituxan.  Again, I do not see anything that looks like recurrence of her lymphoma.  I realize that stress can certainly cause an issue with respect to relapse.  Thankfully, I do not see anything that suggest this.  We will plan to get her back in 3 more months.   Volanda Napoleon, MD 3/29/202210:59 AM

## 2020-05-30 NOTE — Patient Instructions (Signed)
Rituximab Injection What is this medicine? RITUXIMAB (ri TUX i mab) is a monoclonal antibody. It is used to treat certain types of cancer like non-Hodgkin lymphoma and chronic lymphocytic leukemia. It is also used to treat rheumatoid arthritis, granulomatosis with polyangiitis, microscopic polyangiitis, and pemphigus vulgaris. This medicine may be used for other purposes; ask your health care provider or pharmacist if you have questions. COMMON BRAND NAME(S): RIABNI, Rituxan, RUXIENCE What should I tell my health care provider before I take this medicine? They need to know if you have any of these conditions:  chest pain  heart disease  infection especially a viral infection such as chickenpox, cold sores, hepatitis B, or herpes  immune system problems  irregular heartbeat or rhythm  kidney disease  low blood counts (white cells, platelets, or red cells)  lung disease  recent or upcoming vaccine  an unusual or allergic reaction to rituximab, other medicines, foods, dyes, or preservatives  pregnant or trying to get pregnant  breast-feeding How should I use this medicine? This medicine is injected into a vein. It is given by a health care provider in a hospital or clinic setting. A special MedGuide will be given to you before each treatment. Be sure to read this information carefully each time. Talk to your health care provider about the use of this medicine in children. While this drug may be prescribed for children as young as 2 years for selected conditions, precautions do apply. Overdosage: If you think you have taken too much of this medicine contact a poison control center or emergency room at once. NOTE: This medicine is only for you. Do not share this medicine with others. What if I miss a dose? Keep appointments for follow-up doses. It is important not to miss your dose. Call your health care provider if you are unable to keep an appointment. What may interact with this  medicine? Do not take this medicine with any of the following medicines:  live vaccines This medicine may also interact with the following medicines:  cisplatin This list may not describe all possible interactions. Give your health care provider a list of all the medicines, herbs, non-prescription drugs, or dietary supplements you use. Also tell them if you smoke, drink alcohol, or use illegal drugs. Some items may interact with your medicine. What should I watch for while using this medicine? Your condition will be monitored carefully while you are receiving this medicine. You may need blood work done while you are taking this medicine. This medicine can cause serious infusion reactions. To reduce the risk your health care provider may give you other medicines to take before receiving this one. Be sure to follow the directions from your health care provider. This medicine may increase your risk of getting an infection. Call your health care provider for advice if you get a fever, chills, sore throat, or other symptoms of a cold or flu. Do not treat yourself. Try to avoid being around people who are sick. Call your health care provider if you are around anyone with measles, chickenpox, or if you develop sores or blisters that do not heal properly. Avoid taking medicines that contain aspirin, acetaminophen, ibuprofen, naproxen, or ketoprofen unless instructed by your health care provider. These medicines may hide a fever. This medicine may cause serious skin reactions. They can happen weeks to months after starting the medicine. Contact your health care provider right away if you notice fevers or flu-like symptoms with a rash. The rash may be red   or purple and then turn into blisters or peeling of the skin. Or, you might notice a red rash with swelling of the face, lips or lymph nodes in your neck or under your arms. In some patients, this medicine may cause a serious brain infection that may cause  death. If you have any problems seeing, thinking, speaking, walking, or standing, tell your healthcare professional right away. If you cannot reach your healthcare professional, urgently seek other source of medical care. Do not become pregnant while taking this medicine or for at least 12 months after stopping it. Women should inform their health care provider if they wish to become pregnant or think they might be pregnant. There is potential for serious harm to an unborn child. Talk to your health care provider for more information. Women should use a reliable form of birth control while taking this medicine and for 12 months after stopping it. Do not breast-feed while taking this medicine or for at least 6 months after stopping it. What side effects may I notice from receiving this medicine? Side effects that you should report to your health care provider as soon as possible:  allergic reactions (skin rash, itching or hives; swelling of the face, lips, or tongue)  diarrhea  edema (sudden weight gain; swelling of the ankles, feet, hands or other unusual swelling; trouble breathing)  fast, irregular heartbeat  heart attack (trouble breathing; pain or tightness in the chest, neck, back or arms; unusually weak or tired)  infection (fever, chills, cough, sore throat, pain or trouble passing urine)  kidney injury (trouble passing urine or change in the amount of urine)  liver injury (dark yellow or brown urine; general ill feeling or flu-like symptoms; loss of appetite, right upper belly pain; unusually weak or tired, yellowing of the eyes or skin)  low blood pressure (dizziness; feeling faint or lightheaded, falls; unusually weak or tired)  low red blood cell counts (trouble breathing; feeling faint; lightheaded, falls; unusually weak or tired)  mouth sores  redness, blistering, peeling, or loosening of the skin, including inside the mouth  stomach pain  unusual bruising or  bleeding  wheezing (trouble breathing with loud or whistling sounds)  vomiting Side effects that usually do not require medical attention (report to your health care provider if they continue or are bothersome):  headache  joint pain  muscle cramps, pain  nausea This list may not describe all possible side effects. Call your doctor for medical advice about side effects. You may report side effects to FDA at 1-800-FDA-1088. Where should I keep my medicine? This medicine is given in a hospital or clinic. It will not be stored at home. NOTE: This sheet is a summary. It may not cover all possible information. If you have questions about this medicine, talk to your doctor, pharmacist, or health care provider.  2021 [employer]/Gold Standard (2019-12-02 21:35:50)

## 2020-05-30 NOTE — Telephone Encounter (Signed)
Pt called and she is aware of her appts in June per 05/30/20 los   Amanda [name]
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
## 2020-05-30 NOTE — Patient Instructions (Signed)

## 2020-06-01 ENCOUNTER — Encounter: Payer: Self-pay | Admitting: Occupational Therapy

## 2020-06-01 ENCOUNTER — Ambulatory Visit: Payer: Medicare Other | Admitting: Physical Therapy

## 2020-06-01 ENCOUNTER — Ambulatory Visit: Payer: Medicare Other | Admitting: Occupational Therapy

## 2020-06-01 ENCOUNTER — Other Ambulatory Visit: Payer: Self-pay

## 2020-06-01 ENCOUNTER — Encounter: Payer: Self-pay | Admitting: Physical Therapy

## 2020-06-01 DIAGNOSIS — M5459 Other low back pain: Secondary | ICD-10-CM | POA: Diagnosis not present

## 2020-06-01 DIAGNOSIS — R262 Difficulty in walking, not elsewhere classified: Secondary | ICD-10-CM

## 2020-06-01 DIAGNOSIS — M79601 Pain in right arm: Secondary | ICD-10-CM

## 2020-06-01 DIAGNOSIS — G8929 Other chronic pain: Secondary | ICD-10-CM

## 2020-06-01 DIAGNOSIS — R29898 Other symptoms and signs involving the musculoskeletal system: Secondary | ICD-10-CM

## 2020-06-01 DIAGNOSIS — M542 Cervicalgia: Secondary | ICD-10-CM

## 2020-06-01 DIAGNOSIS — M6281 Muscle weakness (generalized): Secondary | ICD-10-CM

## 2020-06-01 DIAGNOSIS — R42 Dizziness and giddiness: Secondary | ICD-10-CM | POA: Diagnosis not present

## 2020-06-01 DIAGNOSIS — Z9181 History of falling: Secondary | ICD-10-CM

## 2020-06-01 NOTE — Patient Instructions (Signed)
Access Code: [email or web address] URL: [email or web address] Date: 06/01/2020 Prepared by: Merleen Nicely, OTR/L  Exercises Seated Shoulder Flexion Towel Slide at Table Top - 2 x daily - 7 x weekly - 1 sets - 25 reps Seated Chest Press with Bar - 2 x daily - 7 x weekly - 1 sets - 25 reps Seated Shoulder External Rotation AAROM with Dowel - 2 x daily - 7 x weekly - 1 sets - 25 reps Seated Shoulder Abduction AAROM with Dowel - 2 x daily - 7 x weekly - 1 sets - 25 reps Standing Shoulder Extension with Dowel - 2 x daily - 7 x weekly - 1 sets - 25 reps
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
## 2020-06-05 ENCOUNTER — Ambulatory Visit: Payer: Medicare Other | Admitting: Occupational Therapy

## 2020-06-05 ENCOUNTER — Ambulatory Visit: Payer: Medicare Other | Admitting: Physical Therapy

## 2020-06-07 ENCOUNTER — Encounter: Payer: Self-pay | Admitting: Occupational Therapy

## 2020-06-07 ENCOUNTER — Encounter: Payer: Self-pay | Admitting: Physical Therapy

## 2020-06-07 ENCOUNTER — Ambulatory Visit: Payer: Medicare Other | Admitting: Physical Therapy

## 2020-06-07 ENCOUNTER — Ambulatory Visit: Payer: Medicare Other | Attending: Physical Medicine and Rehabilitation | Admitting: Occupational Therapy

## 2020-06-07 ENCOUNTER — Ambulatory Visit (INDEPENDENT_AMBULATORY_CARE_PROVIDER_SITE_OTHER): Payer: Medicare Other | Admitting: Pharmacist

## 2020-06-07 ENCOUNTER — Other Ambulatory Visit: Payer: Self-pay

## 2020-06-07 VITALS — BP 129/81 | HR 79

## 2020-06-07 DIAGNOSIS — M542 Cervicalgia: Secondary | ICD-10-CM

## 2020-06-07 DIAGNOSIS — G8929 Other chronic pain: Secondary | ICD-10-CM

## 2020-06-07 DIAGNOSIS — M545 Low back pain, unspecified: Secondary | ICD-10-CM

## 2020-06-07 DIAGNOSIS — R262 Difficulty in walking, not elsewhere classified: Secondary | ICD-10-CM | POA: Insufficient documentation

## 2020-06-07 DIAGNOSIS — M6281 Muscle weakness (generalized): Secondary | ICD-10-CM

## 2020-06-07 DIAGNOSIS — E039 Hypothyroidism, unspecified: Secondary | ICD-10-CM

## 2020-06-07 DIAGNOSIS — I10 Essential (primary) hypertension: Secondary | ICD-10-CM | POA: Diagnosis not present

## 2020-06-07 DIAGNOSIS — Z9181 History of falling: Secondary | ICD-10-CM | POA: Insufficient documentation

## 2020-06-07 DIAGNOSIS — R252 Cramp and spasm: Secondary | ICD-10-CM | POA: Insufficient documentation

## 2020-06-07 DIAGNOSIS — M79601 Pain in right arm: Secondary | ICD-10-CM

## 2020-06-07 DIAGNOSIS — R29898 Other symptoms and signs involving the musculoskeletal system: Secondary | ICD-10-CM | POA: Diagnosis not present

## 2020-06-07 DIAGNOSIS — M810 Age-related osteoporosis without current pathological fracture: Secondary | ICD-10-CM | POA: Diagnosis not present

## 2020-06-07 DIAGNOSIS — K219 Gastro-esophageal reflux disease without esophagitis: Secondary | ICD-10-CM

## 2020-06-07 DIAGNOSIS — E785 Hyperlipidemia, unspecified: Secondary | ICD-10-CM | POA: Diagnosis not present

## 2020-06-07 DIAGNOSIS — R739 Hyperglycemia, unspecified: Secondary | ICD-10-CM
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
## 2020-06-07 NOTE — Patient Instructions (Signed)
Visit Information  PATIENT GOALS: Goals Addressed            This Visit's Progress   . Chronic Care Management Pharmacy Care Plan   On track    CARE PLAN ENTRY (see longitudinal plan of care for additional care plan information)  Current Barriers:  . Chronic Disease Management support, education, and care coordination needs related to Hypertension, Hyperlipidemia, Pre-Diabetes, Hypothyroidism, Depression, GERD, Osteopenia   Hypertension BP Readings from Last 3 Encounters:  05/30/20 (!) 162/75  04/28/20 (!) 143/83  04/27/20 136/78   . Pharmacist Clinical Goal(s): o Over the next 90 days, patient will work with PharmD and providers to maintain BP goal <140/90 . Current regimen:  o Amlodipine 5mg  daily at breakfast . Interventions: o Requested patient to check her BP 1-2 times per week and record o Discussed effects of uncontrolled pain on blood pressure . Patient self care activities - Over the next 90 days, patient will: o Check BP 1-2 times per week, document, and provide at future appointments o Ensure daily salt intake < 2300 mg/day  Hyperlipidemia Lab Results  Component Value Date/Time   LDLCALC 84 12/07/2019 02:29 PM   LDLDIRECT 123.0 04/27/2020 02:01 PM   . Pharmacist Clinical Goal(s): o Over the next 90 days, patient will work with PharmD and providers to achieve LDL goal < 100 . Current regimen:  o Diet and exercise management   . Interventions: o Discussed diet and exercise o Also discussed benefits of statin therapy - patient declines . Patient self care activities - Over the next 90 days, patient will: o Achieve LDL less than 100 o Work on lowering intake of foods high in saturate or trans fat.   Pre-Diabetes Lab Results  Component Value Date/Time   HGBA1C 6.1 (H) 11/18/2014 12:57 AM   HGBA1C 5.8 11/29/2011 03:51 PM   . Pharmacist Clinical Goal(s): o Over the next 90 days, patient will work with PharmD and providers to achieve A1c goal  <6.5% . Current regimen:  o Diet and exercise management   . Interventions: o Discussed diet and exercise o Consider repeat a1c at next office visit . Patient self care activities - Over the next 90 days, patient will: o Maintain a1c <6.5% o Consider rechecking A1c with next labs  GERD . Pharmacist Clinical Goal(s) o Over the next 90 days, patient will work with PharmD and providers to reduce symptoms associated with GERD and decrease polypharmacy . Current regimen:  . Pantoprazole 40mg  daily  . Interventions:  none . Patient self care activities - Over the next 90 days, patient will: o Continue to take pantoprazole 40mg  daily  Osteoporosis . Pharmacist Clinical Goal(s) o Over the next 180 days, patient will work with PharmD and providers to reduce risk of fracture associated with osteoporosis . Current regimen:  o Vitamin D gummies - 2000 IU daily . Interventions: o Discussed results of bone density from January 2022.  o Discussed benefits of Prolia - patient willing to reconsider if Dr Marin Olp if onboard.   . Patient self care activities - Over the next 180 days, patient will: o Consider Prolia 60mg  every 6 months - I will check with Dr Etter Sjogren and Dr Martha Clan about starting this medication and then will also have office staff verify insurance coverage o Continue 2000 units of vitamin D through supplementation  Pain . Pharmacist Clinical Goal(s) o Over the next 90 days, patient will work with PharmD and providers to complete improve back and neck pain .  Interventions: o Currently taking acetaminophen only as needed about 1 or 2 days per week. Recommended while waiting to get MRI results to take acetaminophen 325mg  - 2 tablets twice a day.  . Patient self care activities - Over the next 90 days, patient will: o Take acetaminophen twice daily for pain o MRI planned for next 1 to 2 weeks o Continue to follow up with Dr Nelva Bush  Medication management . Pharmacist Clinical  Goal(s): o Over the next 90 days, patient will work with PharmD and providers to maintain optimal medication adherence . Current pharmacy: [facility] . Interventions o Comprehensive medication review performed. o Continue current medication management strategy . Patient self care activities - Over the next 90 days, patient will: o Focus on medication adherence by filling and taking medications appropriately  o Take medications as prescribed o Report any questions or concerns to PharmD and/or provider(s)  Please see past updates related to this goal by clicking on the "Past Updates" button in the selected goal         Patient verbalizes understanding of instructions provided today and agrees to view in [place].   Telephone follow up appointment with care management team member scheduled for: 3 months  Cherre Robins, PharmD Clinical Pharmacist [place] [phone]
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
## 2020-06-07 NOTE — Patient Instructions (Signed)
Access Code: [email or web address] URL: [email or web address] Date: 06/07/2020  Exercises Seated Shoulder Flexion Towel Slide at Table Top - 2 x daily - 1-2 sets - 10 reps Seated Shoulder Flexion AAROM with Dowel - 2 x daily - 1-2 sets - 10 reps Seated Shoulder External Rotation AAROM with Dowel - 2 x daily - 1-2 sets - 10 reps Seated Shoulder Abduction AAROM with Dowel - 2 x daily - 1-2 sets - 10 reps Standing Shoulder Extension with Dowel - 2 x daily - 1-2 sets - 10 reps
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
## 2020-06-12 ENCOUNTER — Other Ambulatory Visit: Payer: Self-pay

## 2020-06-12 ENCOUNTER — Encounter: Payer: Self-pay | Admitting: Occupational Therapy

## 2020-06-12 ENCOUNTER — Ambulatory Visit: Payer: Medicare Other | Admitting: Occupational Therapy

## 2020-06-12 DIAGNOSIS — M6281 Muscle weakness (generalized): Secondary | ICD-10-CM

## 2020-06-12 DIAGNOSIS — M79601 Pain in right arm: Secondary | ICD-10-CM

## 2020-06-12 DIAGNOSIS — R29898 Other symptoms and signs involving the musculoskeletal system: Secondary | ICD-10-CM

## 2020-06-12 DIAGNOSIS — Z9181 History of falling: Secondary | ICD-10-CM
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
## 2020-06-14 ENCOUNTER — Other Ambulatory Visit: Payer: Self-pay

## 2020-06-14 ENCOUNTER — Encounter: Payer: Self-pay | Admitting: Occupational Therapy

## 2020-06-14 ENCOUNTER — Ambulatory Visit: Payer: Medicare Other | Admitting: Occupational Therapy

## 2020-06-14 DIAGNOSIS — M79601 Pain in right arm: Secondary | ICD-10-CM

## 2020-06-14 DIAGNOSIS — R29898 Other symptoms and signs involving the musculoskeletal system: Secondary | ICD-10-CM

## 2020-06-14 DIAGNOSIS — Z9181 History of falling: Secondary | ICD-10-CM

## 2020-06-14 DIAGNOSIS — M6281 Muscle weakness (generalized): Secondary | ICD-10-CM
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
## 2020-06-19 ENCOUNTER — Telehealth: Payer: Self-pay | Admitting: Pharmacist

## 2020-06-19 ENCOUNTER — Ambulatory Visit: Payer: Medicare Other | Admitting: Occupational Therapy

## 2020-06-19 ENCOUNTER — Ambulatory Visit: Payer: Medicare Other | Admitting: Physical Therapy

## 2020-06-19 ENCOUNTER — Other Ambulatory Visit: Payer: Self-pay

## 2020-06-19 ENCOUNTER — Encounter: Payer: Self-pay | Admitting: Physical Therapy

## 2020-06-19 DIAGNOSIS — M6281 Muscle weakness (generalized): Secondary | ICD-10-CM

## 2020-06-19 DIAGNOSIS — M79601 Pain in right arm: Secondary | ICD-10-CM

## 2020-06-19 DIAGNOSIS — R252 Cramp and spasm: Secondary | ICD-10-CM

## 2020-06-19 DIAGNOSIS — R29898 Other symptoms and signs involving the musculoskeletal system: Secondary | ICD-10-CM

## 2020-06-19 DIAGNOSIS — Z9181 History of falling: Secondary | ICD-10-CM

## 2020-06-19 DIAGNOSIS — M545 Low back pain, unspecified: Secondary | ICD-10-CM

## 2020-06-19 DIAGNOSIS — R262 Difficulty in walking, not elsewhere classified: Secondary | ICD-10-CM

## 2020-06-19 DIAGNOSIS — M542 Cervicalgia: Secondary | ICD-10-CM
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
## 2020-06-19 NOTE — Telephone Encounter (Signed)
-----   Message from [name], [place] sent at 06/12/2020  4:49 PM EDT ----- Regarding: RE: Prolia Yes-- he did say it was ok  ----- Message ----- From: Cherre Robins, PharmD Sent: 06/07/2020   2:46 PM EDT To: [name], DO Subject: Prolia                                         I was hoping you would remember what Dr Marin Olp had recommended about this patient starting Prolia for osteoporosis.  It looks like he may have OK'd but I could not see his responses to your messages to him.  I think the patient is now ready to consider Prolia if you think is still appropriate.   Cherre Robins, PharmD Clinical Pharmacist [facility]

## 2020-06-19 NOTE — Telephone Encounter (Signed)
Tried to call patient to discuss process for insurance verification for Prolia. Unable to reach patient today

## 2020-06-21 ENCOUNTER — Encounter: Payer: Self-pay | Admitting: Physical Medicine & Rehabilitation

## 2020-06-21 ENCOUNTER — Other Ambulatory Visit: Payer: Self-pay

## 2020-06-21 ENCOUNTER — Encounter: Payer: Medicare Other | Attending: Registered Nurse | Admitting: Physical Medicine & Rehabilitation

## 2020-06-21 VITALS — BP 153/78 | HR 73 | Temp 98.2°F | Ht 60.0 in | Wt 143.2 lb

## 2020-06-21 DIAGNOSIS — S069X2S Unspecified intracranial injury with loss of consciousness of 31 minutes to 59 minutes, sequela: Secondary | ICD-10-CM | POA: Insufficient documentation

## 2020-06-21 NOTE — Patient Instructions (Addendum)
PLEASE FEEL FREE TO CALL OUR OFFICE WITH ANY PROBLEMS OR QUESTIONS ([phone])   GAZE EXERCISES FOR VERTIGO?  BENDING EXERCISES AS I SHOWED YOU TO STRETCH YOUR HAMSTRINGS AND LOW BACK
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
## 2020-06-22 ENCOUNTER — Ambulatory Visit: Payer: Medicare Other | Admitting: Physical Therapy

## 2020-06-22 ENCOUNTER — Ambulatory Visit: Payer: Medicare Other | Admitting: Occupational Therapy

## 2020-06-22 ENCOUNTER — Encounter: Payer: Self-pay | Admitting: Occupational Therapy

## 2020-06-22 DIAGNOSIS — M79601 Pain in right arm: Secondary | ICD-10-CM

## 2020-06-22 DIAGNOSIS — Z9181 History of falling: Secondary | ICD-10-CM

## 2020-06-22 DIAGNOSIS — M6281 Muscle weakness (generalized): Secondary | ICD-10-CM | POA: Diagnosis not present

## 2020-06-22 DIAGNOSIS — R29898 Other symptoms and signs involving the musculoskeletal system: Secondary | ICD-10-CM

## 2020-06-23 DIAGNOSIS — H40013 Open angle with borderline findings, low risk, bilateral: Secondary | ICD-10-CM | POA: Diagnosis not present

## 2020-06-23 LAB — HM DIABETES EYE EXAM
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
## 2020-06-24 DIAGNOSIS — M5416 Radiculopathy, lumbar region: Secondary | ICD-10-CM | POA: Diagnosis not present

## 2020-06-27 DIAGNOSIS — S42201D Unspecified fracture of upper end of right humerus, subsequent encounter for fracture with routine healing: Secondary | ICD-10-CM | POA: Diagnosis not present

## 2020-06-28 ENCOUNTER — Encounter: Payer: Self-pay | Admitting: Physical Therapy

## 2020-06-28 ENCOUNTER — Ambulatory Visit: Payer: Medicare Other | Admitting: Physical Therapy

## 2020-06-28 ENCOUNTER — Encounter: Payer: Self-pay | Admitting: Occupational Therapy

## 2020-06-28 ENCOUNTER — Other Ambulatory Visit: Payer: Self-pay

## 2020-06-28 ENCOUNTER — Ambulatory Visit: Payer: Medicare Other | Admitting: Occupational Therapy

## 2020-06-28 DIAGNOSIS — G8929 Other chronic pain: Secondary | ICD-10-CM

## 2020-06-28 DIAGNOSIS — Z9181 History of falling: Secondary | ICD-10-CM

## 2020-06-28 DIAGNOSIS — R29898 Other symptoms and signs involving the musculoskeletal system: Secondary | ICD-10-CM

## 2020-06-28 DIAGNOSIS — R262 Difficulty in walking, not elsewhere classified: Secondary | ICD-10-CM

## 2020-06-28 DIAGNOSIS — M6281 Muscle weakness (generalized): Secondary | ICD-10-CM

## 2020-06-28 DIAGNOSIS — M79601 Pain in right arm: Secondary | ICD-10-CM

## 2020-06-28 DIAGNOSIS — M545 Low back pain, unspecified: Secondary | ICD-10-CM

## 2020-06-28 DIAGNOSIS — R252 Cramp and spasm: Secondary | ICD-10-CM

## 2020-06-28 NOTE — Patient Instructions (Signed)
Access Code: [email or web address] URL: [email or web address] Date: 06/28/2020 Prepared by: Merleen Nicely, OTR/L  Exercises Seated Shoulder Flexion Towel Slide at Table Top - 2-3 x daily - 2 sets - 10 reps Seated Shoulder Abduction Towel Slide at Table Top - 2-3 x daily - 2 sets - 10 reps Supine Shoulder Flexion Extension AAROM with Dowel - 2-3 x daily - 2 sets - 15 reps Supine Shoulder Abduction AAROM with Dowel - 2-3 x daily - 2 sets - 15 reps Supine Shoulder External Rotation in 45 Degrees Abduction AAROM with Dowel - 2-3 x daily - 2 sets - 15 reps
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
## 2020-06-30 ENCOUNTER — Ambulatory Visit: Payer: Medicare Other | Admitting: Physical Therapy

## 2020-06-30 ENCOUNTER — Encounter: Payer: Self-pay | Admitting: Occupational Therapy

## 2020-06-30 ENCOUNTER — Other Ambulatory Visit: Payer: Self-pay

## 2020-06-30 ENCOUNTER — Ambulatory Visit: Payer: Medicare Other | Admitting: Occupational Therapy

## 2020-06-30 ENCOUNTER — Encounter: Payer: Self-pay | Admitting: Physical Therapy

## 2020-06-30 DIAGNOSIS — Z9181 History of falling: Secondary | ICD-10-CM

## 2020-06-30 DIAGNOSIS — R29898 Other symptoms and signs involving the musculoskeletal system: Secondary | ICD-10-CM

## 2020-06-30 DIAGNOSIS — R252 Cramp and spasm: Secondary | ICD-10-CM

## 2020-06-30 DIAGNOSIS — G8929 Other chronic pain: Secondary | ICD-10-CM

## 2020-06-30 DIAGNOSIS — R262 Difficulty in walking, not elsewhere classified: Secondary | ICD-10-CM

## 2020-06-30 DIAGNOSIS — M542 Cervicalgia: Secondary | ICD-10-CM

## 2020-06-30 DIAGNOSIS — M6281 Muscle weakness (generalized): Secondary | ICD-10-CM

## 2020-06-30 DIAGNOSIS — M545 Low back pain, unspecified: Secondary | ICD-10-CM

## 2020-06-30 DIAGNOSIS — M79601 Pain in right arm: Secondary | ICD-10-CM
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
## 2020-07-03 ENCOUNTER — Other Ambulatory Visit: Payer: Self-pay

## 2020-07-03 ENCOUNTER — Encounter: Payer: Self-pay | Admitting: Occupational Therapy

## 2020-07-03 ENCOUNTER — Ambulatory Visit: Payer: Medicare Other | Attending: Physical Medicine and Rehabilitation | Admitting: Occupational Therapy

## 2020-07-03 ENCOUNTER — Encounter: Payer: 59 | Admitting: Physical Therapy

## 2020-07-03 DIAGNOSIS — M545 Low back pain, unspecified: Secondary | ICD-10-CM | POA: Insufficient documentation

## 2020-07-03 DIAGNOSIS — R29898 Other symptoms and signs involving the musculoskeletal system: Secondary | ICD-10-CM | POA: Diagnosis not present

## 2020-07-03 DIAGNOSIS — M6281 Muscle weakness (generalized): Secondary | ICD-10-CM | POA: Insufficient documentation

## 2020-07-03 DIAGNOSIS — R262 Difficulty in walking, not elsewhere classified: Secondary | ICD-10-CM | POA: Diagnosis not present

## 2020-07-03 DIAGNOSIS — Z9181 History of falling: Secondary | ICD-10-CM | POA: Insufficient documentation

## 2020-07-03 DIAGNOSIS — M79601 Pain in right arm: Secondary | ICD-10-CM | POA: Insufficient documentation

## 2020-07-03 DIAGNOSIS — G8929 Other chronic pain: Secondary | ICD-10-CM | POA: Diagnosis not present

## 2020-07-03 DIAGNOSIS — M542 Cervicalgia: Secondary | ICD-10-CM | POA: Insufficient documentation

## 2020-07-03 DIAGNOSIS — R252 Cramp and spasm: Secondary | ICD-10-CM | POA: Diagnosis not present
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
## 2020-07-05 ENCOUNTER — Ambulatory Visit: Payer: Medicare Other | Admitting: Occupational Therapy

## 2020-07-05 ENCOUNTER — Other Ambulatory Visit: Payer: Self-pay

## 2020-07-05 ENCOUNTER — Ambulatory Visit: Payer: Medicare Other | Admitting: Physical Therapy

## 2020-07-05 ENCOUNTER — Encounter: Payer: Self-pay | Admitting: Occupational Therapy

## 2020-07-05 ENCOUNTER — Encounter: Payer: Self-pay | Admitting: Physical Therapy

## 2020-07-05 DIAGNOSIS — M545 Low back pain, unspecified: Secondary | ICD-10-CM

## 2020-07-05 DIAGNOSIS — M79601 Pain in right arm: Secondary | ICD-10-CM

## 2020-07-05 DIAGNOSIS — R29898 Other symptoms and signs involving the musculoskeletal system: Secondary | ICD-10-CM

## 2020-07-05 DIAGNOSIS — Z9181 History of falling: Secondary | ICD-10-CM

## 2020-07-05 DIAGNOSIS — R262 Difficulty in walking, not elsewhere classified: Secondary | ICD-10-CM

## 2020-07-05 DIAGNOSIS — R252 Cramp and spasm: Secondary | ICD-10-CM

## 2020-07-05 DIAGNOSIS — M6281 Muscle weakness (generalized): Secondary | ICD-10-CM

## 2020-07-05 DIAGNOSIS — G8929 Other chronic pain: Secondary | ICD-10-CM

## 2020-07-05 DIAGNOSIS — M542 Cervicalgia: Secondary | ICD-10-CM
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
## 2020-07-05 NOTE — Patient Instructions (Signed)
Access Code: [email or web address] URL: [email or web address] Date: 07/05/2020 Prepared by: Merleen Nicely, OTR/L  Exercises Circular Shoulder Pendulum with Table Support - 2 x daily  1 set of 15 reps  *Pendulum exercise can also be completed while seated w/ LUE resting on LLE Seated Shoulder Flexion Towel Slide at Table Top - 2-3 x daily  2 sets of 10 reps Seated Shoulder Abduction Towel Slide at Table Top - 2-3 x daily  2 sets of 10 reps Supine Shoulder Flexion Extension AAROM with Dowel - 2-3 x daily  2 sets of 15 reps Supine Shoulder Abduction AAROM with Dowel - 2-3 x daily  2 sets of 15 reps Standing Shoulder External Rotation Stretch in Doorway - 2 x daily  2 sets of 5 reps, holding each rep 3-5 seconds  Or... Standing Shoulder External Rotation Stretch at Wall - 2 x daily  2 sets of 5 reps, holding each rep 3-5 seconds Seated Posterior Shoulder Stretch - 2 x daily  1 set of 5 reps, holding each rep ~10 seconds
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
## 2020-07-07 DIAGNOSIS — M47816 Spondylosis without myelopathy or radiculopathy, lumbar region: Secondary | ICD-10-CM | POA: Diagnosis not present

## 2020-07-11 ENCOUNTER — Other Ambulatory Visit: Payer: Self-pay

## 2020-07-11 ENCOUNTER — Ambulatory Visit: Payer: Medicare Other | Admitting: Occupational Therapy

## 2020-07-11 ENCOUNTER — Encounter: Payer: Self-pay | Admitting: Occupational Therapy

## 2020-07-11 DIAGNOSIS — R29898 Other symptoms and signs involving the musculoskeletal system: Secondary | ICD-10-CM

## 2020-07-11 DIAGNOSIS — Z9181 History of falling: Secondary | ICD-10-CM

## 2020-07-11 DIAGNOSIS — M79601 Pain in right arm: Secondary | ICD-10-CM | POA: Diagnosis not present

## 2020-07-11 DIAGNOSIS — M6281 Muscle weakness (generalized): Secondary | ICD-10-CM
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
## 2020-07-13 ENCOUNTER — Ambulatory Visit: Payer: Medicare Other | Admitting: Physical Therapy

## 2020-07-13 ENCOUNTER — Ambulatory Visit: Payer: Medicare Other | Admitting: Occupational Therapy

## 2020-07-17 ENCOUNTER — Other Ambulatory Visit: Payer: Self-pay

## 2020-07-17 ENCOUNTER — Ambulatory Visit: Payer: Medicare Other | Admitting: Physical Therapy

## 2020-07-17 DIAGNOSIS — M79601 Pain in right arm: Secondary | ICD-10-CM | POA: Diagnosis not present

## 2020-07-17 DIAGNOSIS — R262 Difficulty in walking, not elsewhere classified: Secondary | ICD-10-CM

## 2020-07-17 DIAGNOSIS — M6281 Muscle weakness (generalized): Secondary | ICD-10-CM

## 2020-07-17 DIAGNOSIS — R29898 Other symptoms and signs involving the musculoskeletal system: Secondary | ICD-10-CM
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
## 2020-07-17 NOTE — Telephone Encounter (Signed)
I believe Amanda [name] is still doing prior auth for prolia?

## 2020-07-18 ENCOUNTER — Ambulatory Visit: Payer: Medicare Other | Admitting: Occupational Therapy

## 2020-07-18 ENCOUNTER — Encounter: Payer: Self-pay | Admitting: Occupational Therapy

## 2020-07-18 DIAGNOSIS — M79601 Pain in right arm: Secondary | ICD-10-CM | POA: Diagnosis not present

## 2020-07-18 DIAGNOSIS — R29898 Other symptoms and signs involving the musculoskeletal system: Secondary | ICD-10-CM

## 2020-07-18 DIAGNOSIS — M6281 Muscle weakness (generalized): Secondary | ICD-10-CM

## 2020-07-19 NOTE — Telephone Encounter (Signed)
Clinical info submitted to insurance company. Waiting on summary of benefits to determine coverage. Will call patient to discuss once received.  
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
## 2020-07-21 ENCOUNTER — Encounter: Payer: Self-pay | Admitting: Physical Therapy

## 2020-07-21 ENCOUNTER — Other Ambulatory Visit: Payer: Self-pay

## 2020-07-21 ENCOUNTER — Ambulatory Visit: Payer: Medicare Other | Admitting: Physical Therapy

## 2020-07-21 ENCOUNTER — Ambulatory Visit: Payer: Medicare Other | Admitting: Occupational Therapy

## 2020-07-21 DIAGNOSIS — R29898 Other symptoms and signs involving the musculoskeletal system: Secondary | ICD-10-CM

## 2020-07-21 DIAGNOSIS — M6281 Muscle weakness (generalized): Secondary | ICD-10-CM

## 2020-07-21 DIAGNOSIS — M79601 Pain in right arm: Secondary | ICD-10-CM | POA: Diagnosis not present

## 2020-07-21 DIAGNOSIS — Z9181 History of falling: Secondary | ICD-10-CM

## 2020-07-21 DIAGNOSIS — R262 Difficulty in walking, not elsewhere classified: Secondary | ICD-10-CM

## 2020-07-21 DIAGNOSIS — G8929 Other chronic pain: Secondary | ICD-10-CM

## 2020-07-21 DIAGNOSIS — M542 Cervicalgia: Secondary | ICD-10-CM
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
## 2020-07-24 ENCOUNTER — Ambulatory Visit: Payer: Medicare Other | Admitting: Physical Therapy

## 2020-07-25 ENCOUNTER — Other Ambulatory Visit: Payer: Self-pay

## 2020-07-28 ENCOUNTER — Ambulatory Visit: Payer: Medicare Other | Admitting: Occupational Therapy

## 2020-07-28 ENCOUNTER — Other Ambulatory Visit: Payer: Self-pay

## 2020-07-28 ENCOUNTER — Encounter: Payer: Self-pay | Admitting: Physical Therapy

## 2020-07-28 ENCOUNTER — Encounter: Payer: Self-pay | Admitting: Occupational Therapy

## 2020-07-28 ENCOUNTER — Ambulatory Visit: Payer: Medicare Other | Admitting: Physical Therapy

## 2020-07-28 DIAGNOSIS — M6281 Muscle weakness (generalized): Secondary | ICD-10-CM

## 2020-07-28 DIAGNOSIS — R252 Cramp and spasm: Secondary | ICD-10-CM

## 2020-07-28 DIAGNOSIS — G8929 Other chronic pain: Secondary | ICD-10-CM

## 2020-07-28 DIAGNOSIS — M79601 Pain in right arm: Secondary | ICD-10-CM | POA: Diagnosis not present

## 2020-07-28 DIAGNOSIS — M545 Low back pain, unspecified: Secondary | ICD-10-CM

## 2020-07-28 DIAGNOSIS — M542 Cervicalgia: Secondary | ICD-10-CM

## 2020-07-28 DIAGNOSIS — R262 Difficulty in walking, not elsewhere classified: Secondary | ICD-10-CM

## 2020-07-28 DIAGNOSIS — R29898 Other symptoms and signs involving the musculoskeletal system: Secondary | ICD-10-CM
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
## 2020-07-29 NOTE — Patient Instructions (Signed)
Access Code: [email or web address] URL: [email or web address] Date: 07/29/2020 Prepared by: Merleen Nicely, OTR/L  Exercises Circular Shoulder Pendulum with Support - 3 x daily, 15 reps Shoulder Flexion Towel Slide at Table Top Full Range of Motion - 3 x daily, 20 reps Seated Single Arm Shoulder Abduction - Thumb Up - 3 x daily, 20 reps

## 2020-07-29 NOTE — Addendum Note (Signed)
Addended by: Kathrine Cords on: 07/29/2020 01:13 PM   Modules accepted: Orders

## 2020-08-03 DIAGNOSIS — M47816 Spondylosis without myelopathy or radiculopathy, lumbar region: Secondary | ICD-10-CM | POA: Diagnosis not present

## 2020-08-04 ENCOUNTER — Encounter: Payer: Self-pay | Admitting: Physical Therapy

## 2020-08-04 ENCOUNTER — Ambulatory Visit: Payer: Medicare Other | Admitting: Occupational Therapy

## 2020-08-04 ENCOUNTER — Ambulatory Visit: Payer: Medicare Other | Attending: Physical Medicine and Rehabilitation | Admitting: Physical Therapy

## 2020-08-04 ENCOUNTER — Other Ambulatory Visit: Payer: Self-pay

## 2020-08-04 DIAGNOSIS — M545 Low back pain, unspecified: Secondary | ICD-10-CM | POA: Diagnosis not present

## 2020-08-04 DIAGNOSIS — R262 Difficulty in walking, not elsewhere classified: Secondary | ICD-10-CM | POA: Insufficient documentation

## 2020-08-04 DIAGNOSIS — G8929 Other chronic pain: Secondary | ICD-10-CM | POA: Insufficient documentation

## 2020-08-04 DIAGNOSIS — R29898 Other symptoms and signs involving the musculoskeletal system: Secondary | ICD-10-CM | POA: Insufficient documentation

## 2020-08-04 DIAGNOSIS — M79601 Pain in right arm: Secondary | ICD-10-CM | POA: Diagnosis not present

## 2020-08-04 DIAGNOSIS — M542 Cervicalgia: Secondary | ICD-10-CM | POA: Insufficient documentation

## 2020-08-04 DIAGNOSIS — M6281 Muscle weakness (generalized): Secondary | ICD-10-CM

## 2020-08-04 DIAGNOSIS — R42 Dizziness and giddiness: Secondary | ICD-10-CM | POA: Insufficient documentation

## 2020-08-04 DIAGNOSIS — R252 Cramp and spasm: Secondary | ICD-10-CM | POA: Diagnosis not present
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
## 2020-08-07 ENCOUNTER — Ambulatory Visit: Payer: Medicare Other | Admitting: Occupational Therapy

## 2020-08-07 ENCOUNTER — Other Ambulatory Visit: Payer: Self-pay

## 2020-08-07 MED ORDER — AMLODIPINE BESYLATE 10 MG PO TABS: 10.0000 mg | ORAL_TABLET | Freq: Every day | ORAL | 0 refills | Status: DC

## 2020-08-07 MED ORDER — BUPROPION HCL ER (XL) 150 MG PO TB24: 150.0000 mg | ORAL_TABLET | Freq: Every day | ORAL | 0 refills | Status: DC

## 2020-08-08 DIAGNOSIS — S42201D Unspecified fracture of upper end of right humerus, subsequent encounter for fracture with routine healing: Secondary | ICD-10-CM | POA: Diagnosis not present

## 2020-08-10 ENCOUNTER — Encounter: Payer: Self-pay | Admitting: Physical Therapy

## 2020-08-10 ENCOUNTER — Other Ambulatory Visit: Payer: Self-pay

## 2020-08-10 ENCOUNTER — Ambulatory Visit: Payer: Medicare Other | Admitting: Physical Therapy

## 2020-08-10 DIAGNOSIS — R252 Cramp and spasm: Secondary | ICD-10-CM

## 2020-08-10 DIAGNOSIS — M6281 Muscle weakness (generalized): Secondary | ICD-10-CM | POA: Diagnosis not present

## 2020-08-10 DIAGNOSIS — R262 Difficulty in walking, not elsewhere classified: Secondary | ICD-10-CM

## 2020-08-10 DIAGNOSIS — M542 Cervicalgia: Secondary | ICD-10-CM

## 2020-08-10 DIAGNOSIS — G8929 Other chronic pain: Secondary | ICD-10-CM
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
## 2020-08-14 ENCOUNTER — Ambulatory Visit: Payer: 59 | Admitting: Occupational Therapy

## 2020-08-14 ENCOUNTER — Ambulatory Visit: Payer: Medicare Other | Admitting: Occupational Therapy

## 2020-08-16 ENCOUNTER — Encounter: Payer: Self-pay | Admitting: Family Medicine

## 2020-08-17 ENCOUNTER — Encounter: Payer: Self-pay | Admitting: Physical Therapy

## 2020-08-17 ENCOUNTER — Ambulatory Visit: Payer: Medicare Other | Admitting: Physical Therapy

## 2020-08-17 ENCOUNTER — Other Ambulatory Visit: Payer: Self-pay

## 2020-08-17 DIAGNOSIS — M542 Cervicalgia: Secondary | ICD-10-CM

## 2020-08-17 DIAGNOSIS — M6281 Muscle weakness (generalized): Secondary | ICD-10-CM | POA: Diagnosis not present

## 2020-08-17 DIAGNOSIS — R252 Cramp and spasm: Secondary | ICD-10-CM

## 2020-08-17 DIAGNOSIS — R262 Difficulty in walking, not elsewhere classified: Secondary | ICD-10-CM

## 2020-08-17 DIAGNOSIS — M545 Low back pain, unspecified: Secondary | ICD-10-CM

## 2020-08-17 DIAGNOSIS — G8929 Other chronic pain: Secondary | ICD-10-CM
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
## 2020-08-21 ENCOUNTER — Encounter: Payer: 59 | Admitting: Occupational Therapy

## 2020-08-21 ENCOUNTER — Ambulatory Visit: Payer: Medicare Other | Admitting: Occupational Therapy

## 2020-08-21 DIAGNOSIS — M6281 Muscle weakness (generalized): Secondary | ICD-10-CM | POA: Diagnosis not present

## 2020-08-22 ENCOUNTER — Encounter: Payer: Self-pay | Admitting: Occupational Therapy
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
## 2020-08-23 ENCOUNTER — Other Ambulatory Visit: Payer: Self-pay | Admitting: Family Medicine

## 2020-08-23 DIAGNOSIS — K219 Gastro-esophageal reflux disease without esophagitis: Secondary | ICD-10-CM

## 2020-08-24 ENCOUNTER — Ambulatory Visit: Payer: Medicare Other | Admitting: Physical Therapy

## 2020-08-24 ENCOUNTER — Other Ambulatory Visit: Payer: Self-pay

## 2020-08-24 ENCOUNTER — Encounter: Payer: Self-pay | Admitting: Physical Therapy

## 2020-08-24 DIAGNOSIS — M5416 Radiculopathy, lumbar region: Secondary | ICD-10-CM | POA: Diagnosis not present

## 2020-08-24 DIAGNOSIS — M79601 Pain in right arm: Secondary | ICD-10-CM

## 2020-08-24 DIAGNOSIS — M47896 Other spondylosis, lumbar region: Secondary | ICD-10-CM | POA: Diagnosis not present

## 2020-08-24 DIAGNOSIS — R262 Difficulty in walking, not elsewhere classified: Secondary | ICD-10-CM

## 2020-08-24 DIAGNOSIS — M6281 Muscle weakness (generalized): Secondary | ICD-10-CM | POA: Diagnosis not present

## 2020-08-24 DIAGNOSIS — M5136 Other intervertebral disc degeneration, lumbar region: Secondary | ICD-10-CM | POA: Diagnosis not present

## 2020-08-24 DIAGNOSIS — G8929 Other chronic pain: Secondary | ICD-10-CM

## 2020-08-24 DIAGNOSIS — M542 Cervicalgia: Secondary | ICD-10-CM

## 2020-08-24 DIAGNOSIS — R252 Cramp and spasm: Secondary | ICD-10-CM
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
## 2020-08-28 ENCOUNTER — Ambulatory Visit: Payer: 59 | Admitting: Occupational Therapy

## 2020-08-28 ENCOUNTER — Encounter: Payer: 59 | Admitting: Occupational Therapy

## 2020-08-29 ENCOUNTER — Inpatient Hospital Stay (HOSPITAL_BASED_OUTPATIENT_CLINIC_OR_DEPARTMENT_OTHER): Payer: Medicare Other | Admitting: Hematology & Oncology

## 2020-08-29 ENCOUNTER — Other Ambulatory Visit: Payer: Self-pay

## 2020-08-29 ENCOUNTER — Inpatient Hospital Stay: Payer: Medicare Other | Attending: Hematology & Oncology

## 2020-08-29 ENCOUNTER — Inpatient Hospital Stay: Payer: Medicare Other

## 2020-08-29 ENCOUNTER — Encounter: Payer: Self-pay | Admitting: Hematology & Oncology

## 2020-08-29 ENCOUNTER — Telehealth: Payer: Self-pay

## 2020-08-29 VITALS — BP 128/67 | HR 60 | Temp 98.2°F | Resp 18

## 2020-08-29 VITALS — BP 130/72 | HR 64 | Temp 98.4°F | Resp 18 | Wt 141.0 lb

## 2020-08-29 DIAGNOSIS — Z888 Allergy status to other drugs, medicaments and biological substances status: Secondary | ICD-10-CM | POA: Insufficient documentation

## 2020-08-29 DIAGNOSIS — C8330 Diffuse large B-cell lymphoma, unspecified site: Secondary | ICD-10-CM | POA: Insufficient documentation

## 2020-08-29 DIAGNOSIS — Z881 Allergy status to other antibiotic agents status: Secondary | ICD-10-CM | POA: Diagnosis not present

## 2020-08-29 DIAGNOSIS — Z79899 Other long term (current) drug therapy: Secondary | ICD-10-CM | POA: Diagnosis not present

## 2020-08-29 DIAGNOSIS — C8580 Other specified types of non-Hodgkin lymphoma, unspecified site: Secondary | ICD-10-CM

## 2020-08-29 DIAGNOSIS — Z5112 Encounter for antineoplastic immunotherapy: Secondary | ICD-10-CM | POA: Diagnosis not present

## 2020-08-29 LAB — CBC WITH DIFFERENTIAL (CANCER CENTER ONLY)
Abs Immature Granulocytes: 0.03 10*3/uL (ref 0.00–0.07)
Basophils Absolute: 0.1 10*3/uL (ref 0.0–0.1)
Basophils Relative: 1 %
Eosinophils Absolute: 0.3 10*3/uL (ref 0.0–0.5)
Eosinophils Relative: 5 %
HCT: 37.3 % (ref 36.0–46.0)
Hemoglobin: 12.5 g/dL (ref 12.0–15.0)
Immature Granulocytes: 1 %
Lymphocytes Relative: 24 %
Lymphs Abs: 1.4 10*3/uL (ref 0.7–4.0)
MCH: 27.5 pg (ref 26.0–34.0)
MCHC: 33.5 g/dL (ref 30.0–36.0)
MCV: 82.2 fL (ref 80.0–100.0)
Monocytes Absolute: 0.8 10*3/uL (ref 0.1–1.0)
Monocytes Relative: 14 %
Neutro Abs: 3.3 10*3/uL (ref 1.7–7.7)
Neutrophils Relative %: 55 %
Platelet Count: 243 10*3/uL (ref 150–400)
RBC: 4.54 MIL/uL (ref 3.87–5.11)
RDW: 13.8 % (ref 11.5–15.5)
WBC Count: 5.9 10*3/uL (ref 4.0–10.5)
nRBC: 0 % (ref 0.0–0.2)

## 2020-08-29 LAB — CMP (CANCER CENTER ONLY)
ALT: 12 U/L (ref 0–44)
AST: 16 U/L (ref 15–41)
Albumin: 4.2 g/dL (ref 3.5–5.0)
Alkaline Phosphatase: 86 U/L (ref 38–126)
Anion gap: 8 (ref 5–15)
BUN: 21 mg/dL (ref 8–23)
CO2: 29 mmol/L (ref 22–32)
Calcium: 9.6 mg/dL (ref 8.9–10.3)
Chloride: 103 mmol/L (ref 98–111)
Creatinine: 0.88 mg/dL (ref 0.44–1.00)
GFR, Estimated: >60 mL/min (ref >60)
Glucose, Bld: 101 mg/dL — ABNORMAL HIGH (ref 70–99)
Potassium: 3.7 mmol/L (ref 3.5–5.1)
Sodium: 140 mmol/L (ref 135–145)
Total Bilirubin: 0.3 mg/dL (ref 0.3–1.2)
Total Protein: 5.9 g/dL — ABNORMAL LOW (ref 6.5–8.1)

## 2020-08-29 LAB — LACTATE DEHYDROGENASE: LDH: 96 U/L — ABNORMAL LOW (ref 98–192)

## 2020-08-29 MED ORDER — SODIUM CHLORIDE 0.9% FLUSH
10.0000 mL | INTRAVENOUS | Status: DC | PRN
  Administered 2020-08-29: 10 mL
  Filled 2020-08-29: qty 10

## 2020-08-29 MED ORDER — DIPHENHYDRAMINE HCL 25 MG PO CAPS
ORAL_CAPSULE | ORAL | Status: AC
  Filled 2020-08-29: qty 2

## 2020-08-29 MED ORDER — ACETAMINOPHEN 325 MG PO TABS
650.0000 mg | ORAL_TABLET | Freq: Once | ORAL | Status: AC
  Administered 2020-08-29: 650 mg via ORAL

## 2020-08-29 MED ORDER — DIPHENHYDRAMINE HCL 25 MG PO CAPS
50.0000 mg | ORAL_CAPSULE | Freq: Once | ORAL | Status: AC
  Administered 2020-08-29: 50 mg via ORAL

## 2020-08-29 MED ORDER — SODIUM CHLORIDE 0.9 % IV SOLN
Freq: Once | INTRAVENOUS | Status: AC
  Filled 2020-08-29: qty 250

## 2020-08-29 MED ORDER — SODIUM CHLORIDE 0.9 % IV SOLN
375.0000 mg/m2 | Freq: Once | INTRAVENOUS | Status: AC
  Administered 2020-08-29: 600 mg via INTRAVENOUS
  Filled 2020-08-29: qty 50

## 2020-08-29 MED ORDER — ACETAMINOPHEN 325 MG PO TABS
ORAL_TABLET | ORAL | Status: AC
  Filled 2020-08-29: qty 2

## 2020-08-29 NOTE — Telephone Encounter (Signed)
As made and printed for pt per 08/29/20 los

## 2020-08-29 NOTE — Patient Instructions (Signed)
[facility]  Discharge Instructions: ?Thank you for choosing [facility] to provide your oncology and hematology care.  ? ?If you have a lab appointment with the Cancer Center, please go directly to the Cancer Center and check in at the registration area. ? ?Wear comfortable clothing and clothing appropriate for easy access to any Portacath or PICC line.  ? ?We strive to give you quality time with your provider. You may need to reschedule your appointment if you arrive late (15 or more minutes).  Arriving late affects you and other patients whose appointments are after yours.  Also, if you miss three or more appointments without notifying the office, you may be dismissed from the clinic at the provider?s discretion.    ?  ?For prescription refill requests, have your pharmacy contact our office and allow 72 hours for refills to be completed.   ? ?Today you received the following chemotherapy and/or immunotherapy agents Rituxan  ?  ?To help prevent nausea and vomiting after your treatment, we encourage you to take your nausea medication as directed. ? ?BELOW ARE SYMPTOMS THAT SHOULD BE REPORTED IMMEDIATELY: ?*FEVER GREATER THAN 100.4 F (38 ?C) OR HIGHER ?*CHILLS OR SWEATING ?*NAUSEA AND VOMITING THAT IS NOT CONTROLLED WITH YOUR NAUSEA MEDICATION ?*UNUSUAL SHORTNESS OF BREATH ?*UNUSUAL BRUISING OR BLEEDING ?*URINARY PROBLEMS (pain or burning when urinating, or frequent urination) ?*BOWEL PROBLEMS (unusual diarrhea, constipation, pain near the anus) ?TENDERNESS IN MOUTH AND THROAT WITH OR WITHOUT PRESENCE OF ULCERS (sore throat, sores in mouth, or a toothache) ?UNUSUAL RASH, SWELLING OR PAIN  ?UNUSUAL VAGINAL DISCHARGE OR ITCHING  ? ?Items with * indicate a potential emergency and should be followed up as soon as possible or go to the Emergency Department if any problems should occur. ? ?Please show the CHEMOTHERAPY ALERT CARD or IMMUNOTHERAPY ALERT CARD at check-in to the  Emergency Department and triage nurse. ?Should you have questions after your visit or need to cancel or reschedule your appointment, please contact [facility]  [phone] and follow the prompts.  Office hours are 8:00 a.m. to 4:30 p.m. Monday - Friday. Please note that voicemails left after 4:00 p.m. may not be returned until the following business day.  We are closed weekends and major holidays. You have access to a nurse at all times for urgent questions. Please call the main number to the clinic [phone] and follow the prompts. ? ?For any non-urgent questions, you may also contact your provider using MyChart. We now offer e-Visits for anyone 18 and older to request care online for non-urgent symptoms. For details visit [email or web address]. ?  ?Also download the MyChart app! Go to the app store, search "MyChart", open the app, select [facility], and log in with your MyChart username and password. ? ?Due to Covid, a mask is required upon entering the hospital/clinic. If you do not have a mask, one will be given to you upon arrival. For doctor visits, patients may have 1 support person aged 18 or older with them. For treatment visits, patients cannot have anyone with them due to current Covid guidelines and our immunocompromised population.  ?

## 2020-08-29 NOTE — Progress Notes (Signed)
Hematology and Oncology Follow Up Visit  Amanda [name] [id number] 04-14-1940 80 y.o. 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  She is doing quite well.  She is going have a quiet summer.  She and her husband are deciding whether or not they want to go to a assisted living facility.  This is a very tough decision for them.  She has had no issues with fever.  There is no cough or shortness of breath.  She had a fall back in February.  She broke her right humerus.  She has an fracture in her cervical spine.  She actually was intubated.  She has recovered from this although has been a slow recovery.  She has had no issues with bleeding.  There is no change in bowel or bladder habits.  She has had no rashes.  There is been no leg swelling.  Overall, I would say her performance status is ECOG 2.      Medications:  Current Outpatient Medications:    pantoprazole (PROTONIX) 40 MG tablet, Take 1 tablet (40 mg total) by mouth daily., Disp: 90 tablet, Rfl: 0   acetaminophen (TYLENOL) 325 MG tablet, Take 1-2 tablets (325-650 mg total) by mouth every 8 (eight) hours as needed for mild pain., Disp: , Rfl:    amLODipine (NORVASC) 10 MG tablet, Take 1 tablet (10 mg total) by mouth daily., Disp: 90 tablet, Rfl: 0   aspirin 81 MG chewable tablet, Chew 1 tablet (81 mg total) by mouth daily., Disp:  , Rfl:    buPROPion (WELLBUTRIN XL) 150 MG 24 hr tablet, Take 1 tablet (150 mg total) by mouth daily., Disp: 90 tablet, Rfl: 0   Cholecalciferol (VITAMIN D3 ADULT GUMMIES) 25 MCG (1000 UT) CHEW, Chew 2 each by mouth daily., Disp: , Rfl:    levothyroxine (SYNTHROID) 125 MCG tablet, Take 1 tablet (125 mcg total) by mouth daily before breakfast., Disp: 90 tablet, Rfl: 1   lidocaine-prilocaine  (EMLA) cream, Apply 1 application topically as needed., Disp: , Rfl:    methocarbamol (ROBAXIN) 500 MG tablet, Take 500 mg by mouth 3 (three) times daily as needed., Disp: , Rfl:    Multiple Vitamins-Minerals (MACULAR HEALTH FORMULA) CAPS, Take 1 capsule by mouth in the morning and at bedtime., Disp: , Rfl:    senna-docusate (SENOKOT-S) 8.6-50 MG tablet, Take 2 tablets by mouth at bedtime., Disp: 60 tablet, Rfl: 0 No current facility-administered medications for this visit.  Facility-Administered Medications Ordered in Other Visits:    diphenhydrAMINE (BENADRYL) capsule 50 mg, 50 mg, Oral, Once, Tish Men, MD   heparin lock flush 100 unit/mL, 500 Units, Intracatheter, Once PRN, Tish Men, MD   sodium chloride flush (NS) 0.9 % injection 10 mL, 10 mL, Intracatheter, PRN, Tish Men, MD  Allergies:  Allergies  Allergen Reactions   Phenergan [Promethazine Hcl] Other (See Comments)    Jerking/agitation   Preservision Areds 2 [Multiple Vitamins-Minerals] Nausea Only   Levaquin [Levofloxacin] Nausea And Vomiting   Pravastatin Nausea And Vomiting   Preservision Areds 2 [Multiple Vitamins-Minerals] Nausea Only   Promethazine Other (See Comments)    "Agitation and jerking"   Levofloxacin Nausea And Vomiting   Pravastatin Nausea And Vomiting    Past Medical History,  Surgical history, Social history, and Family History were reviewed and updated.  Review of Systems: Review of Systems  Constitutional: Negative.   HENT:  Negative.    Eyes: Negative.   Respiratory: Negative.    Cardiovascular: Negative.   Gastrointestinal: Negative.   Endocrine: Negative.   Genitourinary: Negative.    Musculoskeletal: Negative.   Skin: Negative.   Neurological: Negative.   Hematological: Negative.   Psychiatric/Behavioral: Negative.     Physical Exam:  weight is 141 lb (64 kg). Her oral temperature is 98.4 F (36.9 C). Her blood pressure is 130/72 and her pulse is 64. Her respiration is 18 and oxygen  saturation is 100%.   Wt Readings from Last 3 Encounters:  08/29/20 141 lb (64 kg)  06/21/20 143 lb 3.2 oz (65 kg)  05/30/20 142 lb 6.4 oz (64.6 kg)    Physical Exam Vitals reviewed.  HENT:     Head: Normocephalic and atraumatic.  Eyes:     Pupils: Pupils are equal, round, and reactive to light.  Cardiovascular:     Rate and Rhythm: Normal rate and regular rhythm.     Heart sounds: Normal heart sounds.  Pulmonary:     Effort: Pulmonary effort is normal.     Breath sounds: Normal breath sounds.  Abdominal:     General: Bowel sounds are normal.     Palpations: Abdomen is soft.  Musculoskeletal:        General: No tenderness or deformity. Normal range of motion.     Cervical back: Normal range of motion.  Lymphadenopathy:     Cervical: No cervical adenopathy.  Skin:    General: Skin is warm and dry.     Findings: No erythema or rash.  Neurological:     Mental Status: She is alert and oriented to person, place, and time.  Psychiatric:        Behavior: Behavior normal.        Thought Content: Thought content normal.        Judgment: Judgment normal.     Lab Results  Component Value Date   WBC 5.9 08/29/2020   HGB 12.5 08/29/2020   HCT 37.3 08/29/2020   MCV 82.2 08/29/2020   PLT 243 08/29/2020     Chemistry      Component Value Date/Time   NA 140 08/29/2020 0954   NA 146 (H) 05/26/2019 1108   K 3.7 08/29/2020 0954   CL 103 08/29/2020 0954   CO2 29 08/29/2020 0954   BUN 21 08/29/2020 0954   BUN 17 05/26/2019 1108   CREATININE 0.88 08/29/2020 0954   CREATININE 0.82 12/07/2019 1429   GLU 91 06/23/2018 0000      Component Value Date/Time   CALCIUM 9.6 08/29/2020 0954   CALCIUM 12.3 (H) 06/16/2018 1639   ALKPHOS 86 08/29/2020 0954   AST 16 08/29/2020 0954   ALT 12 08/29/2020 0954   BILITOT 0.3 08/29/2020 0954      Impression and Plan: Amanda [name] is a very charming 80 year old white female.  She has a transformed marginal zone lymphoma.  She will get her  maintenance Rituxan.  She will complete her maintenance Rituxan in November of this year.  I am just glad that she is doing okay.  I know it is a very difficult decision is whether or not to move.  She and her husband are not thinking about this.  We will plan to get her back in another 3 months.     Rudell Cobb  Marin Olp, MD 6/28/202211:28 AM

## 2020-08-29 NOTE — Patient Instructions (Signed)
Implanted Port Home Guide An implanted port is a device that is placed under the skin. It is usually placed in the chest. The device can be used to give IV medicine, to take blood, or for dialysis. You may have an implanted port if: You need IV medicine that would be irritating to the small veins in your hands or arms. You need IV medicines, such as antibiotics, for a long period of time. You need IV nutrition for a long period of time. You need dialysis. When you have a port, your health care provider can choose to use the port instead of veins in your arms for these procedures. You may have fewer limitations when using a port than you would if you used other types of long-term IVs, and you will likely be able to return to normal activities afteryour incision heals. An implanted port has two main parts: Reservoir. The reservoir is the part where a needle is inserted to give medicines or draw blood. The reservoir is round. After it is placed, it appears as a small, raised area under your skin. Catheter. The catheter is a thin, flexible tube that connects the reservoir to a vein. Medicine that is inserted into the reservoir goes into the catheter and then into the vein. How is my port accessed? To access your port: A numbing cream may be placed on the skin over the port site. Your health care provider will put on a mask and sterile gloves. The skin over your port will be cleaned carefully with a germ-killing soap and allowed to dry. Your health care provider will gently pinch the port and insert a needle into it. Your health care provider will check for a blood return to make sure the port is in the vein and is not clogged. If your port needs to remain accessed to get medicine continuously (constant infusion), your health care provider will place a clear bandage (dressing) over the needle site. The dressing and needle will need to be changed every week, or as told by your health care provider. What  is flushing? Flushing helps keep the port from getting clogged. Follow instructions from your health care provider about how and when to flush the port. Ports are usually flushed with saline solution or a medicine called heparin. The need for flushing will depend on how the port is used: If the port is only used from time to time to give medicines or draw blood, the port may need to be flushed: Before and after medicines have been given. Before and after blood has been drawn. As part of routine maintenance. Flushing may be recommended every 4-6 weeks. If a constant infusion is running, the port may not need to be flushed. Throw away any syringes in a disposal container that is meant for sharp items (sharps container). You can buy a sharps container from a pharmacy, or you can make one by using an empty hard plastic bottle with a cover. How long will my port stay implanted? The port can stay in for as long as your health care provider thinks it is needed. When it is time for the port to come out, a surgery will be done to remove it. The surgery will be similar to the procedure that was done to putthe port in. Follow these instructions at home:  Flush your port as told by your health care provider. If you need an infusion over several days, follow instructions from your health care provider about how to take   care of your port site. Make sure you: Wash your hands with soap and water before you change your dressing. If soap and water are not available, use alcohol-based hand sanitizer. Change your dressing as told by your health care provider. Place any used dressings or infusion bags into a plastic bag. Throw that bag in the trash. Keep the dressing that covers the needle clean and dry. Do not get it wet. Do not use scissors or sharp objects near the tube. Keep the tube clamped, unless it is being used. Check your port site every day for signs of infection. Check for: Redness, swelling, or  pain. Fluid or blood. Pus or a bad smell. Protect the skin around the port site. Avoid wearing bra straps that rub or irritate the site. Protect the skin around your port from seat belts. Place a soft pad over your chest if needed. Bathe or shower as told by your health care provider. The site may get wet as long as you are not actively receiving an infusion. Return to your normal activities as told by your health care provider. Ask your health care provider what activities are safe for you. Carry a medical alert card or wear a medical alert bracelet at all times. This will let health care providers know that you have an implanted port in case of an emergency. Get help right away if: You have redness, swelling, or pain at the port site. You have fluid or blood coming from your port site. You have pus or a bad smell coming from the port site. You have a fever. Summary Implanted ports are usually placed in the chest for long-term IV access. Follow instructions from your health care provider about flushing the port and changing bandages (dressings). Take care of the area around your port by avoiding clothing that puts pressure on the area, and by watching for signs of infection. Protect the skin around your port from seat belts. Place a soft pad over your chest if needed. Get help right away if you have a fever or you have redness, swelling, pain, drainage, or a bad smell at the port site. This information is not intended to replace advice given to you by your health care provider. Make sure you discuss any questions you have with your healthcare provider. Document Revised: 07/05/2019 Document Reviewed: 07/05/2019 Elsevier Patient Education  2022 [employer] Inc.  

## 2020-08-30 DIAGNOSIS — M9903 Segmental and somatic dysfunction of lumbar region: Secondary | ICD-10-CM | POA: Diagnosis not present

## 2020-08-30 DIAGNOSIS — M9902 Segmental and somatic dysfunction of thoracic region: Secondary | ICD-10-CM | POA: Diagnosis not present

## 2020-08-30 DIAGNOSIS — M5134 Other intervertebral disc degeneration, thoracic region: Secondary | ICD-10-CM | POA: Diagnosis not present

## 2020-08-30 DIAGNOSIS — M5417 Radiculopathy, lumbosacral region: Secondary | ICD-10-CM | POA: Diagnosis not present

## 2020-08-30 DIAGNOSIS — M5416 Radiculopathy, lumbar region: Secondary | ICD-10-CM | POA: Diagnosis not present

## 2020-08-30 DIAGNOSIS — M9904 Segmental and somatic dysfunction of sacral region: Secondary | ICD-10-CM | POA: Diagnosis not present

## 2020-08-31 ENCOUNTER — Other Ambulatory Visit: Payer: Self-pay

## 2020-08-31 ENCOUNTER — Encounter: Payer: Self-pay | Admitting: Physical Therapy

## 2020-08-31 ENCOUNTER — Ambulatory Visit: Payer: Medicare Other | Admitting: Physical Therapy

## 2020-08-31 DIAGNOSIS — M6281 Muscle weakness (generalized): Secondary | ICD-10-CM

## 2020-08-31 DIAGNOSIS — M545 Low back pain, unspecified: Secondary | ICD-10-CM

## 2020-08-31 DIAGNOSIS — R262 Difficulty in walking, not elsewhere classified: Secondary | ICD-10-CM

## 2020-08-31 DIAGNOSIS — R42 Dizziness and giddiness: Secondary | ICD-10-CM

## 2020-08-31 DIAGNOSIS — M542 Cervicalgia: Secondary | ICD-10-CM
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
## 2020-09-01 DIAGNOSIS — M9902 Segmental and somatic dysfunction of thoracic region: Secondary | ICD-10-CM | POA: Diagnosis not present

## 2020-09-01 DIAGNOSIS — M9903 Segmental and somatic dysfunction of lumbar region: Secondary | ICD-10-CM | POA: Diagnosis not present

## 2020-09-01 DIAGNOSIS — M9904 Segmental and somatic dysfunction of sacral region: Secondary | ICD-10-CM | POA: Diagnosis not present

## 2020-09-01 DIAGNOSIS — M5417 Radiculopathy, lumbosacral region: Secondary | ICD-10-CM | POA: Diagnosis not present

## 2020-09-01 DIAGNOSIS — M5416 Radiculopathy, lumbar region: Secondary | ICD-10-CM | POA: Diagnosis not present

## 2020-09-01 DIAGNOSIS — M5134 Other intervertebral disc degeneration, thoracic region: Secondary | ICD-10-CM | POA: Diagnosis not present

## 2020-09-06 ENCOUNTER — Ambulatory Visit (HOSPITAL_BASED_OUTPATIENT_CLINIC_OR_DEPARTMENT_OTHER)
Admission: RE | Admit: 2020-09-06 | Discharge: 2020-09-06 | Disposition: A | Discharge status: Home / Self Care | Payer: Medicare Other | Source: Ambulatory Visit | Attending: Family Medicine | Admitting: Family Medicine

## 2020-09-06 ENCOUNTER — Encounter: Payer: Self-pay | Admitting: Family Medicine

## 2020-09-06 ENCOUNTER — Other Ambulatory Visit: Payer: Self-pay | Admitting: Family Medicine

## 2020-09-06 ENCOUNTER — Other Ambulatory Visit: Payer: Self-pay

## 2020-09-06 DIAGNOSIS — M5417 Radiculopathy, lumbosacral region: Secondary | ICD-10-CM | POA: Diagnosis not present

## 2020-09-06 DIAGNOSIS — M542 Cervicalgia: Secondary | ICD-10-CM | POA: Diagnosis not present

## 2020-09-06 DIAGNOSIS — M9902 Segmental and somatic dysfunction of thoracic region: Secondary | ICD-10-CM | POA: Diagnosis not present

## 2020-09-06 DIAGNOSIS — M5416 Radiculopathy, lumbar region: Secondary | ICD-10-CM | POA: Diagnosis not present

## 2020-09-06 DIAGNOSIS — M9904 Segmental and somatic dysfunction of sacral region: Secondary | ICD-10-CM | POA: Diagnosis not present

## 2020-09-06 DIAGNOSIS — M9903 Segmental and somatic dysfunction of lumbar region: Secondary | ICD-10-CM | POA: Diagnosis not present

## 2020-09-06 DIAGNOSIS — M5134 Other intervertebral disc degeneration, thoracic region: Secondary | ICD-10-CM | POA: Diagnosis not present

## 2020-09-08 DIAGNOSIS — M5416 Radiculopathy, lumbar region: Secondary | ICD-10-CM | POA: Diagnosis not present

## 2020-09-08 DIAGNOSIS — M5417 Radiculopathy, lumbosacral region: Secondary | ICD-10-CM | POA: Diagnosis not present

## 2020-09-08 DIAGNOSIS — M9904 Segmental and somatic dysfunction of sacral region: Secondary | ICD-10-CM | POA: Diagnosis not present

## 2020-09-08 DIAGNOSIS — M9902 Segmental and somatic dysfunction of thoracic region: Secondary | ICD-10-CM | POA: Diagnosis not present

## 2020-09-08 DIAGNOSIS — M9903 Segmental and somatic dysfunction of lumbar region: Secondary | ICD-10-CM | POA: Diagnosis not present

## 2020-09-08 DIAGNOSIS — M5134 Other intervertebral disc degeneration, thoracic region: Secondary | ICD-10-CM | POA: Diagnosis not present

## 2020-09-11 ENCOUNTER — Ambulatory Visit: Payer: Medicare Other | Attending: Physical Medicine and Rehabilitation | Admitting: Occupational Therapy

## 2020-09-11 ENCOUNTER — Encounter: Payer: Self-pay | Admitting: Occupational Therapy

## 2020-09-11 DIAGNOSIS — M9903 Segmental and somatic dysfunction of lumbar region: Secondary | ICD-10-CM | POA: Diagnosis not present

## 2020-09-11 DIAGNOSIS — M5134 Other intervertebral disc degeneration, thoracic region: Secondary | ICD-10-CM | POA: Diagnosis not present

## 2020-09-11 DIAGNOSIS — M9901 Segmental and somatic dysfunction of cervical region: Secondary | ICD-10-CM | POA: Diagnosis not present

## 2020-09-11 DIAGNOSIS — M79601 Pain in right arm: Secondary | ICD-10-CM | POA: Diagnosis not present

## 2020-09-11 DIAGNOSIS — R29898 Other symptoms and signs involving the musculoskeletal system: Secondary | ICD-10-CM | POA: Insufficient documentation

## 2020-09-11 DIAGNOSIS — M5032 Other cervical disc degeneration, mid-cervical region, unspecified level: Secondary | ICD-10-CM | POA: Diagnosis not present

## 2020-09-11 DIAGNOSIS — M6281 Muscle weakness (generalized): Secondary | ICD-10-CM | POA: Diagnosis not present

## 2020-09-11 DIAGNOSIS — M9902 Segmental and somatic dysfunction of thoracic region: Secondary | ICD-10-CM | POA: Diagnosis not present

## 2020-09-11 DIAGNOSIS — M9904 Segmental and somatic dysfunction of sacral region: Secondary | ICD-10-CM | POA: Diagnosis not present

## 2020-09-11 DIAGNOSIS — M5136 Other intervertebral disc degeneration, lumbar region: Secondary | ICD-10-CM | POA: Diagnosis not present

## 2020-09-11 DIAGNOSIS — M5417 Radiculopathy, lumbosacral region: Secondary | ICD-10-CM | POA: Diagnosis not present
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
## 2020-09-12 ENCOUNTER — Telehealth: Payer: Self-pay | Admitting: *Deleted

## 2020-09-12 NOTE — Chronic Care Management (AMB) (Signed)
  Care Management   Note  09/12/2020 Name: Amanda [name] MRN: [id number] DOB: 02/05/1941  Amanda [name] is a 80 y.o. year old female who is a primary care patient of [name], DO and is actively engaged with the care management team. I reached out to [name] by phone today to assist with re-scheduling a follow up visit with the Pharmacist  Follow up plan: A telephone outreach attempt made. Patient requesting a return call to reschedule follow up with PharmD.  The care management team will reach out to the patient again over the next 7 days.  If patient returns call to provider office, please advise to call [facility] at [phone].  [facility]

## 2020-09-13 DIAGNOSIS — M9904 Segmental and somatic dysfunction of sacral region: Secondary | ICD-10-CM | POA: Diagnosis not present

## 2020-09-13 DIAGNOSIS — M5136 Other intervertebral disc degeneration, lumbar region: Secondary | ICD-10-CM | POA: Diagnosis not present

## 2020-09-13 DIAGNOSIS — M9901 Segmental and somatic dysfunction of cervical region: Secondary | ICD-10-CM | POA: Diagnosis not present

## 2020-09-13 DIAGNOSIS — M5032 Other cervical disc degeneration, mid-cervical region, unspecified level: Secondary | ICD-10-CM | POA: Diagnosis not present

## 2020-09-13 DIAGNOSIS — M9902 Segmental and somatic dysfunction of thoracic region: Secondary | ICD-10-CM | POA: Diagnosis not present

## 2020-09-13 DIAGNOSIS — M9903 Segmental and somatic dysfunction of lumbar region: Secondary | ICD-10-CM | POA: Diagnosis not present

## 2020-09-13 DIAGNOSIS — M5417 Radiculopathy, lumbosacral region: Secondary | ICD-10-CM | POA: Diagnosis not present

## 2020-09-13 DIAGNOSIS — M5134 Other intervertebral disc degeneration, thoracic region: Secondary | ICD-10-CM | POA: Diagnosis not present

## 2020-09-14 ENCOUNTER — Telehealth: Payer: 59

## 2020-09-18 ENCOUNTER — Ambulatory Visit: Payer: Medicare Other | Admitting: Occupational Therapy

## 2020-09-18 DIAGNOSIS — M6281 Muscle weakness (generalized): Secondary | ICD-10-CM | POA: Diagnosis not present

## 2020-09-19 ENCOUNTER — Encounter: Payer: Self-pay | Admitting: Occupational Therapy

## 2020-09-19 DIAGNOSIS — M5416 Radiculopathy, lumbar region: Secondary | ICD-10-CM | POA: Diagnosis not present
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
## 2020-09-19 NOTE — Chronic Care Management (AMB) (Signed)
  Care Management   Note  09/19/2020 Name: Amanda [name] MRN: [id number] DOB: 10-11-1940  Amanda [name] is a 80 y.o. year old female who is a primary care patient of [name], DO and is actively engaged with the care management team. I reached out to [name] by phone today to assist with re-scheduling a follow up visit with the Pharmacist  Follow up plan: Telephone appointment with care management team member scheduled for:10/05/20  Kyrstan Gotwalt  Care Guide, Embedded Care Coordination [facility]  Care Management

## 2020-09-25 ENCOUNTER — Ambulatory Visit: Payer: Medicare Other | Admitting: Occupational Therapy

## 2020-09-27 DIAGNOSIS — M5032 Other cervical disc degeneration, mid-cervical region, unspecified level: Secondary | ICD-10-CM | POA: Diagnosis not present

## 2020-09-27 DIAGNOSIS — M9902 Segmental and somatic dysfunction of thoracic region: Secondary | ICD-10-CM | POA: Diagnosis not present

## 2020-09-27 DIAGNOSIS — M9903 Segmental and somatic dysfunction of lumbar region: Secondary | ICD-10-CM | POA: Diagnosis not present

## 2020-09-27 DIAGNOSIS — M5134 Other intervertebral disc degeneration, thoracic region: Secondary | ICD-10-CM | POA: Diagnosis not present

## 2020-09-27 DIAGNOSIS — M9901 Segmental and somatic dysfunction of cervical region: Secondary | ICD-10-CM | POA: Diagnosis not present

## 2020-09-27 DIAGNOSIS — M9904 Segmental and somatic dysfunction of sacral region: Secondary | ICD-10-CM | POA: Diagnosis not present

## 2020-09-27 DIAGNOSIS — M5136 Other intervertebral disc degeneration, lumbar region: Secondary | ICD-10-CM | POA: Diagnosis not present

## 2020-09-27 DIAGNOSIS — M5417 Radiculopathy, lumbosacral region: Secondary | ICD-10-CM | POA: Diagnosis not present

## 2020-09-29 DIAGNOSIS — M9904 Segmental and somatic dysfunction of sacral region: Secondary | ICD-10-CM | POA: Diagnosis not present

## 2020-09-29 DIAGNOSIS — M9903 Segmental and somatic dysfunction of lumbar region: Secondary | ICD-10-CM | POA: Diagnosis not present

## 2020-09-29 DIAGNOSIS — M5134 Other intervertebral disc degeneration, thoracic region: Secondary | ICD-10-CM | POA: Diagnosis not present

## 2020-09-29 DIAGNOSIS — M5136 Other intervertebral disc degeneration, lumbar region: Secondary | ICD-10-CM | POA: Diagnosis not present

## 2020-09-29 DIAGNOSIS — M9902 Segmental and somatic dysfunction of thoracic region: Secondary | ICD-10-CM | POA: Diagnosis not present

## 2020-09-29 DIAGNOSIS — M5032 Other cervical disc degeneration, mid-cervical region, unspecified level: Secondary | ICD-10-CM | POA: Diagnosis not present

## 2020-09-29 DIAGNOSIS — M5417 Radiculopathy, lumbosacral region: Secondary | ICD-10-CM | POA: Diagnosis not present

## 2020-09-29 DIAGNOSIS — M9901 Segmental and somatic dysfunction of cervical region: Secondary | ICD-10-CM | POA: Diagnosis not present

## 2020-10-02 ENCOUNTER — Encounter: Payer: Self-pay | Admitting: Occupational Therapy

## 2020-10-02 ENCOUNTER — Ambulatory Visit: Payer: Medicare Other | Attending: Physical Medicine and Rehabilitation | Admitting: Occupational Therapy

## 2020-10-02 DIAGNOSIS — R29898 Other symptoms and signs involving the musculoskeletal system: Secondary | ICD-10-CM | POA: Diagnosis not present

## 2020-10-02 DIAGNOSIS — M79601 Pain in right arm: Secondary | ICD-10-CM | POA: Insufficient documentation

## 2020-10-02 DIAGNOSIS — M6281 Muscle weakness (generalized): Secondary | ICD-10-CM | POA: Diagnosis not present
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
## 2020-10-03 ENCOUNTER — Other Ambulatory Visit: Payer: Self-pay

## 2020-10-03 ENCOUNTER — Ambulatory Visit (INDEPENDENT_AMBULATORY_CARE_PROVIDER_SITE_OTHER): Payer: Medicare Other | Admitting: Family Medicine

## 2020-10-03 ENCOUNTER — Ambulatory Visit (HOSPITAL_BASED_OUTPATIENT_CLINIC_OR_DEPARTMENT_OTHER)
Admission: RE | Admit: 2020-10-03 | Discharge: 2020-10-03 | Disposition: A | Discharge status: Home / Self Care | Payer: Medicare Other | Source: Ambulatory Visit | Attending: Family Medicine | Admitting: Family Medicine

## 2020-10-03 VITALS — BP 126/86 | HR 73 | Temp 98.5°F | Resp 18 | Ht 60.0 in | Wt 149.8 lb

## 2020-10-03 DIAGNOSIS — R0609 Other forms of dyspnea: Secondary | ICD-10-CM

## 2020-10-03 DIAGNOSIS — R079 Chest pain, unspecified: Secondary | ICD-10-CM

## 2020-10-03 DIAGNOSIS — R0600 Dyspnea, unspecified: Secondary | ICD-10-CM

## 2020-10-03 DIAGNOSIS — I517 Cardiomegaly: Secondary | ICD-10-CM | POA: Diagnosis not present

## 2020-10-03 DIAGNOSIS — R0602 Shortness of breath: Secondary | ICD-10-CM

## 2020-10-03 NOTE — Progress Notes (Addendum)
Subjective:   By signing my name below, I, [name], attest that this documentation has been prepared under the direction and in the presence of [name], DO. 10/03/2020   Patient ID: Amanda [name], female    DOB: 1940-08-20, 80 y.o.   MRN: [id number]  Chief Complaint  Patient presents with   Breathing Problem    Pt states having SOB with walking long distances and fatigued. Pt states sxs going on since she fell earlier this year    HPI Patient is in today for a office visit.  She complains of shortness of breath since a fall in January, 2022.  She continues having back pain since January, 2022. She has 2 cortisone injections from her sports medicine specialist and finds it not effective in managing her pain. She has a history of degenerative disk disease. She does not take OTC tylenol to manage her pain. She also notes her left hand occasionally falls asleep. Her provider told her this was due to degenerative disk disease. She also mentions having pain in her neck. The pain has not moved since she first found it.  She also complains of fatigue. She finds she needs more time to recover from activities before she can start another.  She also complains of pain in her neck since January, 2022.  She denies having any palpitations.    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[place])    Osteopenia    Pneumonia    Pneumonia due to COVID-19 virus 2021   Required  hospitalization   PONV (postoperative nausea and vomiting)    needs little anesthesia   Shingles    Shortness of breath    on exertion   Spleen enlarged    SUI (stress urinary incontinence, female)    Urinary, incontinence, stress female    Wears glasses     Past Surgical History:  Procedure Laterality Date   BREAST EXCISIONAL BIOPSY Left [place]   CATARACT EXTRACTION  2009, 2011   BOTH EYES   CATARACT EXTRACTION, BILATERAL     CESAREAN SECTION  1959   CESAREAN SECTION     COLONOSCOPY      Dr Cristina Gong   DILATION AND CURETTAGE OF UTERUS     X2   HYSTEROSCOPY WITH D & C  01/07/2012   Procedure: DILATATION AND CURETTAGE /HYSTEROSCOPY;  Surgeon: Terrance Mass, MD;  Location: [facility];  Service: Gynecology;  Laterality: N/A;  intrauterine foley catheter for tamponode    IR IMAGING GUIDED PORT INSERTION  07/15/2018   LYMPH NODE BIOPSY Left 05/26/2018   Procedure: LEFT AXILLARY LYMPH NODE BIOPSY;  Surgeon: Fanny Skates, MD;  Location: [facility];  Service: General;  Laterality: Left;   ORIF ANKLE FRACTURE Left 12/12/2018   ORIF ANKLE FRACTURE Left 12/12/2018  Procedure: OPEN REDUCTION INTERNAL FIXATION (ORIF) ANKLE FRACTURE;  Surgeon: Meredith Pel, MD;  Location: [facility];  Service: Orthopedics;  Laterality: Left;   ORIF ANKLE FRACTURE Left 12/2018   TONSILLECTOMY     TONSILLECTOMY AND ADENOIDECTOMY     TUBAL LIGATION     BY LAPAROSCOPY   WISDOM TOOTH EXTRACTION      Family History  Problem Relation Age of Onset   Ovarian cancer Mother    Breast cancer Mother 45   Hypertension Father    Prostate cancer Father    Kidney failure Father    Diabetes Father    Hyperlipidemia Brother    COPD Paternal Grandfather    Stroke Maternal Grandfather    Hypercalcemia Neg Hx     Social History   Socioeconomic History   Marital status: Married    Spouse name: Not on file   Number of children: Not on file   Years of education: Not on file   Highest  education level: Not on file  Occupational History   Not on file  Tobacco Use   Smoking status: Never   Smokeless tobacco: Never  Vaping Use   Vaping Use: Never used  Substance and Sexual Activity   Alcohol use: Yes    Comment: RARE   Drug use: Never   Sexual activity: Never    Birth control/protection: Post-menopausal  Other Topics Concern   Not on file  Social History Narrative   ** Merged History Encounter **       ** Merged History Encounter **       Social Determinants of Health   Financial Resource Strain: Low Risk    Difficulty of Paying Living Expenses: Not very hard  Food Insecurity: No Food Insecurity   Worried About Charity fundraiser in the Last Year: Never true   Ran Out of Food in the Last Year: Never true  Transportation Needs: Not on file  Physical Activity: Inactive   Days of Exercise per Week: 0 days   Minutes of Exercise per Session: 0 min  Stress: Not on file  Social Connections: Not on file  Intimate Partner Violence: Not on file    Outpatient Medications Prior to Visit  Medication Sig Dispense Refill   acetaminophen (TYLENOL) 325 MG tablet Take 1-2 tablets (325-650 mg total) by mouth every 8 (eight) hours as needed for mild pain.     amLODipine (NORVASC) 10 MG tablet Take 1 tablet (10 mg total) by mouth daily. 90 tablet 0   aspirin 81 MG chewable tablet Chew 1 tablet (81 mg total) by mouth daily.     buPROPion (WELLBUTRIN XL) 150 MG 24 hr tablet Take 1 tablet (150 mg total) by mouth daily. 90 tablet 0   Cholecalciferol (VITAMIN D3 ADULT GUMMIES) 25 MCG (1000 UT) CHEW Chew 2 each by mouth daily.     levothyroxine (SYNTHROID) 125 MCG tablet Take 1 tablet (125 mcg total) by mouth daily before breakfast. 90 tablet 1   lidocaine-prilocaine (EMLA) cream Apply 1 application topically as needed.     methocarbamol (ROBAXIN) 500 MG tablet Take 500 mg by mouth 3 (three) times daily as needed.     Multiple Vitamins-Minerals (MACULAR HEALTH FORMULA) CAPS Take  1 capsule by mouth in the morning and at bedtime.     pantoprazole (PROTONIX) 40 MG tablet Take 1 tablet (40 mg total) by mouth daily. 90 tablet 0   senna-docusate (SENOKOT-S) 8.6-50 MG tablet Take 2 tablets by mouth at bedtime.  60 tablet 0   Facility-Administered Medications Prior to Visit  Medication Dose Route Frequency Provider Last Rate Last Admin   diphenhydrAMINE (BENADRYL) capsule 50 mg  50 mg Oral Once Tish Men, MD       heparin lock flush 100 unit/mL  500 Units Intracatheter Once PRN Tish Men, MD       sodium chloride flush (NS) 0.9 % injection 10 mL  10 mL Intracatheter PRN Tish Men, MD        Allergies  Allergen Reactions   Phenergan [Promethazine Hcl] Other (See Comments)    Jerking/agitation   Preservision Areds 2 [Multiple Vitamins-Minerals] Nausea Only   Levaquin [Levofloxacin] Nausea And Vomiting   Pravastatin Nausea And Vomiting   Preservision Areds 2 [Multiple Vitamins-Minerals] Nausea Only   Promethazine Other (See Comments)    "Agitation and jerking"   Levofloxacin Nausea And Vomiting   Pravastatin Nausea And Vomiting    Review of Systems  Constitutional:  Positive for malaise/fatigue. Negative for fever.  HENT:  Negative for congestion.   Eyes:  Negative for blurred vision.  Respiratory:  Positive for shortness of breath.   Cardiovascular:  Negative for chest pain, palpitations and leg swelling.  Gastrointestinal:  Negative for abdominal pain, blood in stool and nausea.  Genitourinary:  Negative for dysuria and frequency.  Musculoskeletal:  Positive for back pain and neck pain. Negative for falls.  Skin:  Negative for rash.  Neurological:  Negative for dizziness, loss of consciousness and headaches.  Endo/Heme/Allergies:  Negative for environmental allergies.  Psychiatric/Behavioral:  Negative for depression. The patient is not nervous/anxious.       Objective:    Physical Exam Vitals and nursing note reviewed.  Constitutional:      General: She  is not in acute distress.    Appearance: Normal appearance. She is not ill-appearing.  HENT:     Head: Normocephalic and atraumatic.     Right Ear: External ear normal.     Left Ear: External ear normal.  Eyes:     Extraocular Movements: Extraocular movements intact.     Pupils: Pupils are equal, round, and reactive to light.  Cardiovascular:     Rate and Rhythm: Normal rate and regular rhythm.     Heart sounds: Normal heart sounds. No murmur heard.   No gallop.  Pulmonary:     Effort: Pulmonary effort is normal. No respiratory distress.     Breath sounds: Normal breath sounds. No wheezing or rales.  Chest:     Chest wall: No tenderness.  Skin:    General: Skin is warm and dry.  Neurological:     Mental Status: She is alert and oriented to person, place, and time.  Psychiatric:        Behavior: Behavior normal.        Judgment: Judgment normal.    BP 126/86 (BP Location: Right Arm, Patient Position: Sitting, Cuff Size: Normal)   Pulse 73   Temp 98.5 F (36.9 C) (Oral)   Resp 18   Ht 5' (1.524 m)   Wt 149 lb 12.8 oz (67.9 kg)   SpO2 97%   BMI 29.26 kg/m  Wt Readings from Last 3 Encounters:  10/03/20 149 lb 12.8 oz (67.9 kg)  08/29/20 141 lb (64 kg)  06/21/20 143 lb 3.2 oz (65 kg)    Diabetic Foot Exam - Simple   No data filed    Lab Results  Component Value Date   WBC 5.9 08/29/2020   HGB 12.5  08/29/2020   HCT 37.3 08/29/2020   PLT 243 08/29/2020   GLUCOSE 101 (H) 08/29/2020   CHOL 225 (H) 04/27/2020   TRIG 218.0 (H) 04/27/2020   HDL 63.00 04/27/2020   LDLDIRECT 123.0 04/27/2020   LDLCALC 84 12/07/2019   ALT 12 08/29/2020   AST 16 08/29/2020   NA 140 08/29/2020   K 3.7 08/29/2020   CL 103 08/29/2020   CREATININE 0.88 08/29/2020   BUN 21 08/29/2020   CO2 29 08/29/2020   TSH 12.79 (H) 04/27/2020   INR 1.0 04/01/2020   HGBA1C 6.1 (H) 11/18/2014    Lab Results  Component Value Date   TSH 12.79 (H) 04/27/2020   Lab Results  Component Value Date    WBC 5.9 08/29/2020   HGB 12.5 08/29/2020   HCT 37.3 08/29/2020   MCV 82.2 08/29/2020   PLT 243 08/29/2020   Lab Results  Component Value Date   NA 140 08/29/2020   K 3.7 08/29/2020   CO2 29 08/29/2020   GLUCOSE 101 (H) 08/29/2020   BUN 21 08/29/2020   CREATININE 0.88 08/29/2020   BILITOT 0.3 08/29/2020   ALKPHOS 86 08/29/2020   AST 16 08/29/2020   ALT 12 08/29/2020   PROT 5.9 (L) 08/29/2020   ALBUMIN 4.2 08/29/2020   CALCIUM 9.6 08/29/2020   ANIONGAP 8 08/29/2020   GFR 60.03 04/27/2020   Lab Results  Component Value Date   CHOL 225 (H) 04/27/2020   Lab Results  Component Value Date   HDL 63.00 04/27/2020   Lab Results  Component Value Date   LDLCALC 84 12/07/2019   Lab Results  Component Value Date   TRIG 218.0 (H) 04/27/2020   Lab Results  Component Value Date   CHOLHDL 4 04/27/2020   Lab Results  Component Value Date   HGBA1C 6.1 (H) 11/18/2014       Assessment & Plan:   Problem List Items Addressed This Visit       Unprioritized   Chest pain    ekg no change from previous Check cxr        Relevant Orders   EKG 12-Lead (Completed)   DOE (dyspnea on exertion)    ekg nsr Check cxr Lab Results  Component Value Date   WBC 5.9 08/29/2020   HGB 12.5 08/29/2020   HCT 37.3 08/29/2020   MCV 82.2 08/29/2020   PLT 243 08/29/2020     Chemistry      Component Value Date/Time   NA 140 08/29/2020 0954   NA 146 (H) 05/26/2019 1108   K 3.7 08/29/2020 0954   CL 103 08/29/2020 0954   CO2 29 08/29/2020 0954   BUN 21 08/29/2020 0954   BUN 17 05/26/2019 1108   CREATININE 0.88 08/29/2020 0954   CREATININE 0.82 12/07/2019 1429   GLU 91 06/23/2018 0000      Component Value Date/Time   CALCIUM 9.6 08/29/2020 0954   CALCIUM 12.3 (H) 06/16/2018 1639   ALKPHOS 86 08/29/2020 0954   AST 16 08/29/2020 0954   ALT 12 08/29/2020 0954   BILITOT 0.3 08/29/2020 0954           SOB (shortness of breath) - Primary    Pt states this has been a problem  since she fell earlier this year  Check cxr---  Pt has seen pulmonary-- she may need f/u Pt also hx lymphoma -- per oncology ekg--- low voltage , sinus          Relevant Orders  DG Chest 2 View (Completed)   EKG 12-Lead (Completed)     No orders of the defined types were placed in this encounter.   I, [name], DO, personally preformed the services described in this documentation.  All medical record entries made by the scribe were at my direction and in my presence.  I have reviewed the chart and discharge instructions (if applicable) and agree that the record reflects my personal performance and is accurate and complete. 10/03/2020   I,[name],acting as a scribe for [name], DO.,have documented all relevant documentation on the behalf of [name], DO,as directed by  [name], DO while in the presence of [name], DO.   [name], DO

## 2020-10-03 NOTE — Patient Instructions (Signed)
Shortness of Breath, Adult Shortness of breath means you have trouble breathing. Shortness of breath couldbe a sign of a medical problem. Follow these instructions at home:  Watch for any changes in your symptoms. Do not use any products that contain nicotine or tobacco, such as cigarettes, e-cigarettes, and chewing tobacco. Do not smoke. Smoking can cause shortness of breath. If you need help to quit smoking, ask your doctor. Avoid things that can make it harder to breathe, such as: Mold. Dust. Air pollution. Chemical smells. Things that can cause allergy symptoms (allergens), if you have allergies. Keep your living space clean. Use products that help remove mold and dust. Rest as needed. Slowly return to your normal activities. Take over-the-counter and prescription medicines only as told by your doctor. This includes oxygen therapy and inhaled medicines. Keep all follow-up visits as told by your doctor. This is important. Contact a doctor if: Your condition does not get better as soon as expected. You have a hard time doing your normal activities, even after you rest. You have new symptoms. Get help right away if: Your shortness of breath gets worse. You have trouble breathing when you are resting. You feel light-headed or you pass out (faint). You have a cough that is not helped by medicines. You cough up blood. You have pain with breathing. You have pain in your chest, arms, shoulders, or belly (abdomen). You have a fever. You cannot walk up stairs. You cannot exercise the way you normally do. These symptoms may represent a serious problem that is an emergency. Do not wait to see if the symptoms will go away. Get medical help right away. Call your local emergency services (911 in the [place] Do not drive yourself to the hospital. Summary Shortness of breath is when you have trouble breathing enough air. It can be a sign of a medical problem. Avoid things that make it hard for  you to breathe, such as smoking, pollution, mold, and dust. Watch for any changes in your symptoms. Contact your doctor if you do not get better or you get worse. This information is not intended to replace advice given to you by your health care provider. Make sure you discuss any questions you have with your healthcare provider. Document Revised: 07/21/2017 Document Reviewed: 07/21/2017 Elsevier Patient Education  2022 [employer].

## 2020-10-03 NOTE — Assessment & Plan Note (Signed)
ekg nsr Check cxr Lab Results  Component Value Date   WBC 5.9 08/29/2020   HGB 12.5 08/29/2020   HCT 37.3 08/29/2020   MCV 82.2 08/29/2020   PLT 243 08/29/2020     Chemistry      Component Value Date/Time   NA 140 08/29/2020 0954   NA 146 (H) 05/26/2019 1108   K 3.7 08/29/2020 0954   CL 103 08/29/2020 0954   CO2 29 08/29/2020 0954   BUN 21 08/29/2020 0954   BUN 17 05/26/2019 1108   CREATININE 0.88 08/29/2020 0954   CREATININE 0.82 12/07/2019 1429   GLU 91 06/23/2018 0000      Component Value Date/Time   CALCIUM 9.6 08/29/2020 0954   CALCIUM 12.3 (H) 06/16/2018 1639   ALKPHOS 86 08/29/2020 0954   AST 16 08/29/2020 0954   ALT 12 08/29/2020 0954   BILITOT 0.3 08/29/2020 0954

## 2020-10-03 NOTE — Assessment & Plan Note (Signed)
ekg no change from previous Check cxr

## 2020-10-04 ENCOUNTER — Encounter: Payer: Self-pay | Admitting: Family Medicine

## 2020-10-04 DIAGNOSIS — M5417 Radiculopathy, lumbosacral region: Secondary | ICD-10-CM | POA: Diagnosis not present

## 2020-10-04 DIAGNOSIS — M9902 Segmental and somatic dysfunction of thoracic region: Secondary | ICD-10-CM | POA: Diagnosis not present

## 2020-10-04 DIAGNOSIS — M9904 Segmental and somatic dysfunction of sacral region: Secondary | ICD-10-CM | POA: Diagnosis not present

## 2020-10-04 DIAGNOSIS — M9903 Segmental and somatic dysfunction of lumbar region: Secondary | ICD-10-CM | POA: Diagnosis not present

## 2020-10-04 DIAGNOSIS — M5136 Other intervertebral disc degeneration, lumbar region: Secondary | ICD-10-CM | POA: Diagnosis not present

## 2020-10-04 DIAGNOSIS — M5134 Other intervertebral disc degeneration, thoracic region: Secondary | ICD-10-CM | POA: Diagnosis not present

## 2020-10-04 DIAGNOSIS — M5032 Other cervical disc degeneration, mid-cervical region, unspecified level: Secondary | ICD-10-CM | POA: Diagnosis not present

## 2020-10-04 DIAGNOSIS — M9901 Segmental and somatic dysfunction of cervical region: Secondary | ICD-10-CM | POA: Diagnosis not present

## 2020-10-04 NOTE — Assessment & Plan Note (Signed)
Pt states this has been a problem since she fell earlier this year  Check cxr---  Pt has seen pulmonary-- she may need f/u Pt also hx lymphoma -- per oncology ekg--- low voltage , sinus

## 2020-10-05 ENCOUNTER — Ambulatory Visit (INDEPENDENT_AMBULATORY_CARE_PROVIDER_SITE_OTHER): Payer: Medicare Other | Admitting: Pharmacist

## 2020-10-05 DIAGNOSIS — G8929 Other chronic pain: Secondary | ICD-10-CM

## 2020-10-05 DIAGNOSIS — M542 Cervicalgia: Secondary | ICD-10-CM

## 2020-10-05 DIAGNOSIS — I10 Essential (primary) hypertension: Secondary | ICD-10-CM | POA: Diagnosis not present

## 2020-10-05 DIAGNOSIS — E785 Hyperlipidemia, unspecified: Secondary | ICD-10-CM

## 2020-10-05 DIAGNOSIS — M810 Age-related osteoporosis without current pathological fracture: Secondary | ICD-10-CM

## 2020-10-06 DIAGNOSIS — M9904 Segmental and somatic dysfunction of sacral region: Secondary | ICD-10-CM | POA: Diagnosis not present

## 2020-10-06 DIAGNOSIS — M5032 Other cervical disc degeneration, mid-cervical region, unspecified level: Secondary | ICD-10-CM | POA: Diagnosis not present

## 2020-10-06 DIAGNOSIS — M9901 Segmental and somatic dysfunction of cervical region: Secondary | ICD-10-CM | POA: Diagnosis not present

## 2020-10-06 DIAGNOSIS — M9902 Segmental and somatic dysfunction of thoracic region: Secondary | ICD-10-CM | POA: Diagnosis not present

## 2020-10-06 DIAGNOSIS — M5134 Other intervertebral disc degeneration, thoracic region: Secondary | ICD-10-CM | POA: Diagnosis not present

## 2020-10-06 DIAGNOSIS — M5136 Other intervertebral disc degeneration, lumbar region: Secondary | ICD-10-CM | POA: Diagnosis not present

## 2020-10-06 DIAGNOSIS — M5417 Radiculopathy, lumbosacral region: Secondary | ICD-10-CM | POA: Diagnosis not present

## 2020-10-06 DIAGNOSIS — M9903 Segmental and somatic dysfunction of lumbar region: Secondary | ICD-10-CM | POA: Diagnosis not present

## 2020-10-08 NOTE — Patient Instructions (Signed)
Visit Information  PATIENT GOALS:  Goals Addressed             This Visit's Progress    Chronic Care Management Pharmacy Care Plan   On track    [place] (see longitudinal plan of care for additional care plan information)  Current Barriers:  Chronic Disease Management support, education, and care coordination needs related to Hypertension, Hyperlipidemia, Pre-Diabetes, Hypothyroidism, Depression, GERD, Osteopenia   Hypertension BP Readings from Last 3 Encounters:  10/03/20 126/86  08/29/20 128/67  08/29/20 130/72  Pharmacist Clinical Goal(s): Over the next 90 days, patient will work with PharmD and providers to maintain BP goal <140/90 Current regimen:  Amlodipine '5mg'$  daily at breakfast Interventions: Requested patient to check blood pressure 1 or 2 times per week and record Discussed effects of uncontrolled pain on blood pressure Patient self care activities - Over the next 90 days, patient will: Check blood pressure 1 or 2 times per week, document, and provide at future appointments Ensure daily salt intake < 2300 mg/day  Hyperlipidemia Lab Results  Component Value Date/Time   LDLCALC 84 12/07/2019 02:29 PM   LDLDIRECT 123.0 04/27/2020 02:01 PM  Pharmacist Clinical Goal(s): Over the next 90 days, patient will work with PharmD and providers to achieve LDL goal < 100 Current regimen:  Diet and exercise management   Interventions: Discussed diet and exercise Also discussed benefits of statin therapy - patient declines Patient self care activities - Over the next 90 days, patient will: Achieve LDL less than 100 Work on lowering intake of foods high in saturated or trans fat.   Pre-Diabetes Lab Results  Component Value Date/Time   HGBA1C 6.1 (H) 11/18/2014 12:57 AM   HGBA1C 5.8 11/29/2011 03:51 PM  Pharmacist Clinical Goal(s): Over the next 90 days, patient will work with PharmD and providers to achieve A1c goal <6.5% Current regimen:  Diet and exercise  management   Interventions: Discussed diet and exercise Consider repeat a1c at next office visit Patient self care activities - Over the next 90 days, patient will: Maintain a1c <6.5% Consider rechecking A1c with next labs  GERD Pharmacist Clinical Goal(s) Over the next 90 days, patient will work with PharmD and providers to reduce symptoms associated with GERD and decrease polypharmacy Current regimen:  Pantoprazole '40mg'$  daily  Interventions:  none Patient self care activities - Over the next 90 days, patient will: Continue to take pantoprazole '40mg'$  daily  Osteoporosis Pharmacist Clinical Goal(s) Over the next 180 days, patient will work with PharmD and providers to reduce risk of fracture associated with osteoporosis Current regimen:  Vitamin D gummies - 2000 IU daily Interventions: Discussed results of bone density from January 2022.  Discussed benefits of Prolia - patient willing to reconsider if Dr Marin Olp if onboard.   Patient self care activities - Over the next 180 days, patient will: Consider Prolia '60mg'$  every 6 months - I will check with Dr Etter Sjogren and Dr Martha Clan about starting this medication and then will also have office staff verify insurance coverage Continue 2000 units of vitamin D through supplementation  Pain - arm and back Pharmacist Clinical Goal(s) Over the next 90 days, patient will work with PharmD and providers to complete improve back and neck pain Current regimen:  acetaminophen '325mg'$  - take 1 or 2 tablets every 8 hours as needed for pain Methocarbamol '500mg'$  - up to 3 times a day as needed (not currently taking) Interventions: Recommended taking methocarbamol '500mg'$  at bedtime to help with back pain /  spasms (can use up to 3 times a day if needed)  Patient self care activities - Over the next 90 days, patient will: Take acetaminophen twice daily for pain Restart methocarbamol '500mg'$  - at bedtime; may use up to 3 times a day if needed for muscle spasms.   Continue to follow up with Dr Nelva Bush  Medication management Pharmacist Clinical Goal(s): Over the next 90 days, patient will work with PharmD and providers to maintain optimal medication adherence Current pharmacy: [facility] Interventions Comprehensive medication review performed. Continue current medication management strategy Patient self care activities - Over the next 90 days, patient will: Focus on medication adherence by filling and taking medications appropriately  Take medications as prescribed Report any questions or concerns to PharmD and/or provider(s)  Please see past updates related to this goal by clicking on the "Past Updates" button in the selected goal          The patient verbalized understanding of instructions, educational materials, and care plan provided today and declined offer to receive copy of patient instructions, educational materials, and care plan.   Telephone follow up appointment with care management team member scheduled for: CMA to check back pain and Prolia 1 month / Pharmacist - 3 to 4 months  Cherre Robins, PharmD Clinical Pharmacist [facility]
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
## 2020-10-09 ENCOUNTER — Ambulatory Visit: Payer: Medicare Other | Admitting: Occupational Therapy

## 2020-10-09 ENCOUNTER — Encounter: Payer: Self-pay | Admitting: Occupational Therapy

## 2020-10-09 DIAGNOSIS — M79601 Pain in right arm: Secondary | ICD-10-CM | POA: Diagnosis not present
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
## 2020-10-11 DIAGNOSIS — M9902 Segmental and somatic dysfunction of thoracic region: Secondary | ICD-10-CM | POA: Diagnosis not present

## 2020-10-11 DIAGNOSIS — M4856XD Collapsed vertebra, not elsewhere classified, lumbar region, subsequent encounter for fracture with routine healing: Secondary | ICD-10-CM | POA: Diagnosis not present

## 2020-10-11 DIAGNOSIS — M5417 Radiculopathy, lumbosacral region: Secondary | ICD-10-CM | POA: Diagnosis not present

## 2020-10-11 DIAGNOSIS — M9903 Segmental and somatic dysfunction of lumbar region: Secondary | ICD-10-CM | POA: Diagnosis not present

## 2020-10-11 DIAGNOSIS — M5136 Other intervertebral disc degeneration, lumbar region: Secondary | ICD-10-CM | POA: Diagnosis not present

## 2020-10-11 DIAGNOSIS — M9901 Segmental and somatic dysfunction of cervical region: Secondary | ICD-10-CM | POA: Diagnosis not present

## 2020-10-11 DIAGNOSIS — M5134 Other intervertebral disc degeneration, thoracic region: Secondary | ICD-10-CM | POA: Diagnosis not present

## 2020-10-11 DIAGNOSIS — M9904 Segmental and somatic dysfunction of sacral region: Secondary | ICD-10-CM | POA: Diagnosis not present

## 2020-10-11 DIAGNOSIS — M545 Low back pain, unspecified: Secondary | ICD-10-CM | POA: Diagnosis not present

## 2020-10-11 DIAGNOSIS — M5032 Other cervical disc degeneration, mid-cervical region, unspecified level: Secondary | ICD-10-CM | POA: Diagnosis not present

## 2020-10-16 ENCOUNTER — Encounter: Payer: Self-pay | Admitting: Occupational Therapy

## 2020-10-16 ENCOUNTER — Ambulatory Visit: Payer: Medicare Other | Admitting: Occupational Therapy

## 2020-10-16 DIAGNOSIS — M79601 Pain in right arm: Secondary | ICD-10-CM | POA: Diagnosis not present
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
## 2020-10-17 DIAGNOSIS — M9902 Segmental and somatic dysfunction of thoracic region: Secondary | ICD-10-CM | POA: Diagnosis not present

## 2020-10-17 DIAGNOSIS — M9903 Segmental and somatic dysfunction of lumbar region: Secondary | ICD-10-CM | POA: Diagnosis not present

## 2020-10-17 DIAGNOSIS — M5134 Other intervertebral disc degeneration, thoracic region: Secondary | ICD-10-CM | POA: Diagnosis not present

## 2020-10-17 DIAGNOSIS — M9904 Segmental and somatic dysfunction of sacral region: Secondary | ICD-10-CM | POA: Diagnosis not present

## 2020-10-17 DIAGNOSIS — M5032 Other cervical disc degeneration, mid-cervical region, unspecified level: Secondary | ICD-10-CM | POA: Diagnosis not present

## 2020-10-17 DIAGNOSIS — M5417 Radiculopathy, lumbosacral region: Secondary | ICD-10-CM | POA: Diagnosis not present

## 2020-10-17 DIAGNOSIS — M5136 Other intervertebral disc degeneration, lumbar region: Secondary | ICD-10-CM | POA: Diagnosis not present

## 2020-10-17 DIAGNOSIS — M9901 Segmental and somatic dysfunction of cervical region: Secondary | ICD-10-CM | POA: Diagnosis not present

## 2020-10-20 ENCOUNTER — Telehealth: Payer: Self-pay

## 2020-10-20 NOTE — Telephone Encounter (Signed)
-----   Message from Cherre Robins, PharmD sent at 10/08/2020  7:00 PM EDT ----- Regarding: Edgardo Roys morning Mel Almond,  I think you has started either a PA or verification of benefits for Prolia for Ms. Vanhandel in May. Have you heard anything regarding coverage yet or was it denied?  Thanks  [place]

## 2020-10-20 NOTE — Telephone Encounter (Signed)
Patient prolia covered. Patient should owe $0.   Patient has been scheduled for next week.

## 2020-10-22 ENCOUNTER — Encounter: Payer: Self-pay | Admitting: Family Medicine

## 2020-10-22 ENCOUNTER — Other Ambulatory Visit: Payer: Self-pay | Admitting: Family Medicine

## 2020-10-23 ENCOUNTER — Encounter: Payer: Self-pay | Admitting: Family Medicine

## 2020-10-23 ENCOUNTER — Encounter: Payer: Self-pay | Admitting: Occupational Therapy

## 2020-10-23 ENCOUNTER — Ambulatory Visit: Payer: Medicare Other | Admitting: Occupational Therapy

## 2020-10-23 DIAGNOSIS — M79601 Pain in right arm: Secondary | ICD-10-CM | POA: Diagnosis not present

## 2020-10-23 DIAGNOSIS — R29898 Other symptoms and signs involving the musculoskeletal system: Secondary | ICD-10-CM

## 2020-10-23 DIAGNOSIS — M6281 Muscle weakness (generalized): Secondary | ICD-10-CM
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
## 2020-10-24 ENCOUNTER — Other Ambulatory Visit: Payer: Self-pay

## 2020-10-24 NOTE — Telephone Encounter (Signed)
Patient called and canceled prolia injection. She does not wish to receive prolia. She states she "would rather have osteoporosis than deal with the side effects she read about"  I did try to explain the benefits of the medications outweighing the risks however she declines at this time.

## 2020-10-26 ENCOUNTER — Ambulatory Visit: Payer: 59

## 2020-10-30 ENCOUNTER — Other Ambulatory Visit: Payer: Self-pay

## 2020-10-30 ENCOUNTER — Ambulatory Visit: Payer: Medicare Other | Admitting: Occupational Therapy

## 2020-10-30 DIAGNOSIS — M6281 Muscle weakness (generalized): Secondary | ICD-10-CM

## 2020-10-30 DIAGNOSIS — M79601 Pain in right arm: Secondary | ICD-10-CM | POA: Diagnosis not present

## 2020-10-30 DIAGNOSIS — R29898 Other symptoms and signs involving the musculoskeletal system: Secondary | ICD-10-CM

## 2020-10-31 ENCOUNTER — Encounter: Payer: Self-pay | Admitting: Occupational Therapy

## 2020-10-31 ENCOUNTER — Telehealth: Payer: Self-pay
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
## 2020-10-31 NOTE — Telephone Encounter (Signed)
Patient called stating she could see a line or vein protruding from her port to her neck and wanted to know if this was normal.  Attempted to call pt back to get more details with no answer, LM for pt to call back.

## 2020-11-01 ENCOUNTER — Telehealth: Payer: Self-pay | Admitting: *Deleted

## 2020-11-01 NOTE — Telephone Encounter (Signed)
Call received from patient concerned that she can see a "line" from her port to her neck and would like to know if this is normal.  Pt states that she has no pain or swelling to her port, neck or right arm, no chills/fever, no redness to port and that skin is intact. Informed pt that she could come in to have port checked if needed.  Pt stated that she does not need to come in at this time and will call if any other issues arise.

## 2020-11-02 ENCOUNTER — Encounter: Payer: Self-pay | Admitting: Hematology & Oncology

## 2020-11-10 ENCOUNTER — Other Ambulatory Visit: Payer: Self-pay | Admitting: Family Medicine

## 2020-11-10 ENCOUNTER — Telehealth: Payer: Self-pay | Admitting: Pharmacist

## 2020-11-10 MED ORDER — BUPROPION HCL ER (XL) 150 MG PO TB24: 150.0000 mg | ORAL_TABLET | Freq: Every day | ORAL | 0 refills | Status: DC

## 2020-11-10 NOTE — Chronic Care Management (AMB) (Signed)
    Chronic Care Management Pharmacy Assistant   Name: Amanda [name]  MRN: [id number] DOB: 02-22-1941   Reason for Encounter: Disease State General   Recent office visits:  None noted  Recent consult visits:  10/30/20-(Occupational Therapy) Algernon Huxley. Leia Alf, OT  08//22/22-(Occupational Therapy) [place] Huxley. Leia Alf, OT  10/16/20-(Occupational Therapy) Algernon Huxley. [place], [place]  10/09/20- (Occupational Therapy) Algernon Huxley. [place], [place] Hospital visits:  None in previous 6 months  Medications: Outpatient Encounter Medications as of 11/10/2020  Medication Sig   levothyroxine (SYNTHROID) 125 MCG tablet TAKE 1 TABLET(125 MCG) BY MOUTH DAILY BEFORE AND BREAKFAST   acetaminophen (TYLENOL) 325 MG tablet Take 1-2 tablets (325-650 mg total) by mouth every 8 (eight) hours as needed for mild pain.   amLODipine (NORVASC) 10 MG tablet Take 1 tablet (10 mg total) by mouth daily.   aspirin 81 MG chewable tablet Chew 1 tablet (81 mg total) by mouth daily.   buPROPion (WELLBUTRIN XL) 150 MG 24 hr tablet Take 1 tablet (150 mg total) by mouth daily.   Cholecalciferol (VITAMIN D3 ADULT GUMMIES) 25 MCG (1000 UT) CHEW Chew 2 each by mouth daily.   lidocaine-prilocaine (EMLA) cream Apply 1 application topically as needed.   methocarbamol (ROBAXIN) 500 MG tablet Take 500 mg by mouth 3 (three) times daily as needed. (Patient not taking: Reported on 10/05/2020)   Multiple Vitamins-Minerals (MACULAR HEALTH FORMULA) CAPS Take 1 capsule by mouth in the morning and at bedtime.   pantoprazole (PROTONIX) 40 MG tablet Take 1 tablet (40 mg total) by mouth daily.   senna-docusate (SENOKOT-S) 8.6-50 MG tablet Take 2 tablets by mouth at bedtime.   Facility-Administered Encounter Medications as of 11/10/2020  Medication   diphenhydrAMINE (BENADRYL) capsule 50 mg   heparin lock flush 100 unit/mL   sodium chloride flush (NS) 0.9 % injection 10 mL    Have you had any problems recently with your health? Patient states she  has no problems with her health at this time.  Have you had any problems with your pharmacy? Patient states she has no problems with her pharmacy.  What issues or side effects are you having with your medications? Patient states she has no issues or side effects with her medications.  What would you like me to pass along to [facility] for them to help you with?  Patient states there is nothing at this time.  What can we do to take care of you better? Patient states there is nothing at this time.  Star Rating Drugs: None noted  Corrie Mckusick, [place]

## 2020-11-13 ENCOUNTER — Other Ambulatory Visit: Payer: Self-pay

## 2020-11-13 ENCOUNTER — Ambulatory Visit: Payer: Medicare Other | Attending: Physical Medicine and Rehabilitation | Admitting: Occupational Therapy

## 2020-11-13 ENCOUNTER — Encounter: Payer: Self-pay | Admitting: Occupational Therapy

## 2020-11-13 DIAGNOSIS — M79601 Pain in right arm: Secondary | ICD-10-CM | POA: Insufficient documentation

## 2020-11-13 DIAGNOSIS — M6281 Muscle weakness (generalized): Secondary | ICD-10-CM | POA: Insufficient documentation

## 2020-11-13 DIAGNOSIS — R29898 Other symptoms and signs involving the musculoskeletal system: Secondary | ICD-10-CM | POA: Diagnosis not present
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
## 2020-11-14 ENCOUNTER — Other Ambulatory Visit: Payer: Self-pay | Admitting: Family Medicine

## 2020-11-17 ENCOUNTER — Encounter: Payer: Self-pay | Admitting: Family Medicine

## 2020-11-17 DIAGNOSIS — G8929 Other chronic pain: Secondary | ICD-10-CM

## 2020-11-17 DIAGNOSIS — M542 Cervicalgia: Secondary | ICD-10-CM

## 2020-11-20 ENCOUNTER — Ambulatory Visit: Payer: Medicare Other | Admitting: Occupational Therapy

## 2020-11-27 ENCOUNTER — Ambulatory Visit: Payer: Medicare Other | Admitting: Occupational Therapy

## 2020-11-27 ENCOUNTER — Other Ambulatory Visit: Payer: Self-pay

## 2020-11-27 ENCOUNTER — Encounter: Payer: Self-pay | Admitting: Occupational Therapy

## 2020-11-27 DIAGNOSIS — M79601 Pain in right arm: Secondary | ICD-10-CM

## 2020-11-27 DIAGNOSIS — R29898 Other symptoms and signs involving the musculoskeletal system: Secondary | ICD-10-CM

## 2020-11-27 DIAGNOSIS — M6281 Muscle weakness (generalized): Secondary | ICD-10-CM
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
## 2020-11-29 ENCOUNTER — Inpatient Hospital Stay: Payer: Medicare Other

## 2020-11-29 ENCOUNTER — Inpatient Hospital Stay: Payer: Medicare Other | Attending: Hematology & Oncology

## 2020-11-29 ENCOUNTER — Other Ambulatory Visit: Payer: Self-pay | Admitting: *Deleted

## 2020-11-29 ENCOUNTER — Inpatient Hospital Stay (HOSPITAL_BASED_OUTPATIENT_CLINIC_OR_DEPARTMENT_OTHER): Payer: Medicare Other | Admitting: Hematology & Oncology

## 2020-11-29 ENCOUNTER — Encounter: Payer: Self-pay | Admitting: Hematology & Oncology

## 2020-11-29 ENCOUNTER — Other Ambulatory Visit: Payer: Self-pay

## 2020-11-29 VITALS — BP 143/77 | HR 66 | Temp 98.0°F | Resp 17

## 2020-11-29 VITALS — BP 145/71 | HR 67 | Temp 97.9°F | Resp 18 | Ht 60.0 in | Wt 151.1 lb

## 2020-11-29 DIAGNOSIS — D649 Anemia, unspecified: Secondary | ICD-10-CM

## 2020-11-29 DIAGNOSIS — Z5112 Encounter for antineoplastic immunotherapy: Secondary | ICD-10-CM | POA: Diagnosis not present

## 2020-11-29 DIAGNOSIS — Z881 Allergy status to other antibiotic agents status: Secondary | ICD-10-CM | POA: Diagnosis not present

## 2020-11-29 DIAGNOSIS — Z95828 Presence of other vascular implants and grafts: Secondary | ICD-10-CM

## 2020-11-29 DIAGNOSIS — C8330 Diffuse large B-cell lymphoma, unspecified site: Secondary | ICD-10-CM | POA: Diagnosis not present

## 2020-11-29 DIAGNOSIS — C8580 Other specified types of non-Hodgkin lymphoma, unspecified site: Secondary | ICD-10-CM

## 2020-11-29 DIAGNOSIS — Z79899 Other long term (current) drug therapy: Secondary | ICD-10-CM | POA: Diagnosis not present

## 2020-11-29 DIAGNOSIS — Z888 Allergy status to other drugs, medicaments and biological substances status: Secondary | ICD-10-CM | POA: Diagnosis not present

## 2020-11-29 LAB — CBC WITH DIFFERENTIAL (CANCER CENTER ONLY)
Abs Immature Granulocytes: 0.08 10*3/uL — ABNORMAL HIGH (ref 0.00–0.07)
Basophils Absolute: 0.1 10*3/uL (ref 0.0–0.1)
Basophils Relative: 1 %
Eosinophils Absolute: 0.3 10*3/uL (ref 0.0–0.5)
Eosinophils Relative: 4 %
HCT: 36.7 % (ref 36.0–46.0)
Hemoglobin: 12.3 g/dL (ref 12.0–15.0)
Immature Granulocytes: 1 %
Lymphocytes Relative: 16 %
Lymphs Abs: 1.2 10*3/uL (ref 0.7–4.0)
MCH: 27.7 pg (ref 26.0–34.0)
MCHC: 33.5 g/dL (ref 30.0–36.0)
MCV: 82.7 fL (ref 80.0–100.0)
Monocytes Absolute: 0.9 10*3/uL (ref 0.1–1.0)
Monocytes Relative: 11 %
Neutro Abs: 5.3 10*3/uL (ref 1.7–7.7)
Neutrophils Relative %: 67 %
Platelet Count: 357 10*3/uL (ref 150–400)
RBC: 4.44 MIL/uL (ref 3.87–5.11)
RDW: 13.2 % (ref 11.5–15.5)
WBC Count: 7.9 10*3/uL (ref 4.0–10.5)
nRBC: 0 % (ref 0.0–0.2)

## 2020-11-29 LAB — CMP (CANCER CENTER ONLY)
ALT: 10 U/L (ref 0–44)
AST: 12 U/L — ABNORMAL LOW (ref 15–41)
Albumin: 4 g/dL (ref 3.5–5.0)
Alkaline Phosphatase: 82 U/L (ref 38–126)
Anion gap: 10 (ref 5–15)
BUN: 17 mg/dL (ref 8–23)
CO2: 28 mmol/L (ref 22–32)
Calcium: 9.6 mg/dL (ref 8.9–10.3)
Chloride: 105 mmol/L (ref 98–111)
Creatinine: 0.92 mg/dL (ref 0.44–1.00)
GFR, Estimated: >60 mL/min (ref >60)
Glucose, Bld: 111 mg/dL — ABNORMAL HIGH (ref 70–99)
Potassium: 4.1 mmol/L (ref 3.5–5.1)
Sodium: 143 mmol/L (ref 135–145)
Total Bilirubin: 0.2 mg/dL — ABNORMAL LOW (ref 0.3–1.2)
Total Protein: 6.3 g/dL — ABNORMAL LOW (ref 6.5–8.1)

## 2020-11-29 LAB — LACTATE DEHYDROGENASE: LDH: 83 U/L — ABNORMAL LOW (ref 98–192)

## 2020-11-29 LAB — FERRITIN: Ferritin: 229 ng/mL (ref 11–307)

## 2020-11-29 LAB — IRON AND TIBC
Iron: 66 ug/dL (ref 41–142)
Saturation Ratios: 24 % (ref 21–57)
TIBC: 278 ug/dL (ref 236–444)
UIBC: 212 ug/dL (ref 120–384)

## 2020-11-29 MED ORDER — SODIUM CHLORIDE 0.9% FLUSH
10.0000 mL | INTRAVENOUS | Status: DC | PRN
  Administered 2020-11-29: 10 mL

## 2020-11-29 MED ORDER — HEPARIN SOD (PORK) LOCK FLUSH 100 UNIT/ML IV SOLN
500.0000 [IU] | Freq: Once | INTRAVENOUS | Status: AC | PRN
  Administered 2020-11-29: 500 [IU]

## 2020-11-29 MED ORDER — SODIUM CHLORIDE 0.9 % IV SOLN
375.0000 mg/m2 | Freq: Once | INTRAVENOUS | Status: AC
  Administered 2020-11-29: 600 mg via INTRAVENOUS
  Filled 2020-11-29: qty 50

## 2020-11-29 MED ORDER — SODIUM CHLORIDE 0.9% FLUSH
10.0000 mL | Freq: Once | INTRAVENOUS | Status: AC
  Administered 2020-11-29: 10 mL via INTRAVENOUS

## 2020-11-29 MED ORDER — SODIUM CHLORIDE 0.9 % IV SOLN: Freq: Once | INTRAVENOUS | Status: AC

## 2020-11-29 MED ORDER — ACETAMINOPHEN 325 MG PO TABS
650.0000 mg | ORAL_TABLET | Freq: Once | ORAL | Status: AC
  Administered 2020-11-29: 650 mg via ORAL
  Filled 2020-11-29: qty 2

## 2020-11-29 MED ORDER — DIPHENHYDRAMINE HCL 25 MG PO CAPS
50.0000 mg | ORAL_CAPSULE | Freq: Once | ORAL | Status: AC
  Administered 2020-11-29: 50 mg via ORAL
  Filled 2020-11-29: qty 2

## 2020-11-29 NOTE — Progress Notes (Signed)
Hematology and Oncology Follow Up Visit  Amanda [name] [id number] 1940-09-16 80 y.o. 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  Everything seems be going okay for except for the fact that she is tired.  I will check her iron studies.  I noted that her MCV is on the lower side.  I also think that she has spinal stenosis.  She said that she cannot walk all that far before her back starts really hurting.  She has had spinal injections.  I do realize that she did fall earlier this year.  She did have a break in her right humerus and her fracture in her cervical spine.  These seem to be doing okay.  Again, I suspect that she apply a spinal stenosis.  She sees the wonderful neurosurgeon, Dr. Christella Noa who I am sure is on top of all of this.  She has had no fever.  She has had no nausea or vomiting.  There is been no change in bowel or bladder habits.  She has had no issues with cough.  Overall, I would say performance status is probably ECOG 2.      Medications:  Current Outpatient Medications:    acetaminophen (TYLENOL) 325 MG tablet, Take 1-2 tablets (325-650 mg total) by mouth every 8 (eight) hours as needed for mild pain., Disp: , Rfl:    amLODipine (NORVASC) 10 MG tablet, Take 1 tablet (10 mg total) by mouth daily., Disp: 90 tablet, Rfl: 0   aspirin 81 MG chewable tablet, Chew 1 tablet (81 mg total) by mouth daily., Disp:  , Rfl:    buPROPion (WELLBUTRIN XL) 150 MG 24 hr tablet, TAKE 1 TABLET(150 MG) BY MOUTH DAILY, Disp: 90 tablet, Rfl: 1   buPROPion (WELLBUTRIN XL) 150 MG 24 hr tablet, TAKE 1 TABLET(150 MG) BY MOUTH DAILY, Disp: 90 tablet, Rfl: 1   Cholecalciferol (VITAMIN D3 ADULT GUMMIES) 25 MCG (1000 UT) CHEW, Chew 2 each by mouth daily., Disp: , Rfl:     levothyroxine (SYNTHROID) 125 MCG tablet, TAKE 1 TABLET(125 MCG) BY MOUTH DAILY BEFORE AND BREAKFAST, Disp: 90 tablet, Rfl: 1   lidocaine-prilocaine (EMLA) cream, Apply 1 application topically as needed., Disp: , Rfl:    methocarbamol (ROBAXIN) 500 MG tablet, Take 500 mg by mouth 3 (three) times daily as needed., Disp: , Rfl:    Multiple Vitamins-Minerals (MACULAR HEALTH FORMULA) CAPS, Take 1 capsule by mouth in the morning and at bedtime., Disp: , Rfl:    pantoprazole (PROTONIX) 40 MG tablet, Take 1 tablet (40 mg total) by mouth daily., Disp: 90 tablet, Rfl: 0   senna-docusate (SENOKOT-S) 8.6-50 MG tablet, Take 2 tablets by mouth at bedtime., Disp: 60 tablet, Rfl: 0 No current facility-administered medications for this visit.  Facility-Administered Medications Ordered in Other Visits:    diphenhydrAMINE (BENADRYL) capsule 50 mg, 50 mg, Oral, Once, Tish Men, MD   heparin lock flush 100 unit/mL, 500 Units, Intracatheter, Once PRN, Tish Men, MD   sodium chloride flush (NS) 0.9 % injection 10 mL, 10 mL, Intracatheter, PRN, Tish Men, MD  Allergies:  Allergies  Allergen Reactions   Phenergan [Promethazine Hcl] Other (See Comments)    Jerking/agitation   Preservision Areds 2 [Multiple Vitamins-Minerals] Nausea Only  Promethazine Other (See Comments)    "Agitation and jerking"   Levaquin [Levofloxacin] Nausea And Vomiting   Levofloxacin Nausea And Vomiting   Pravastatin Nausea And Vomiting   Pravastatin Nausea And Vomiting   Preservision Areds 2 [Multiple Vitamins-Minerals] Nausea Only    Past Medical History, Surgical history, Social history, and Family History were reviewed and updated.  Review of Systems: Review of Systems  Constitutional: Negative.   HENT:  Negative.    Eyes: Negative.   Respiratory: Negative.    Cardiovascular: Negative.   Gastrointestinal: Negative.   Endocrine: Negative.   Genitourinary: Negative.    Musculoskeletal: Negative.   Skin: Negative.    Neurological: Negative.   Hematological: Negative.   Psychiatric/Behavioral: Negative.     Physical Exam:  height is 5' (1.524 m) and weight is 151 lb 1.9 oz (68.5 kg). Her oral temperature is 97.9 F (36.6 C). Her blood pressure is 145/71 (abnormal) and her pulse is 67. Her respiration is 18 and oxygen saturation is 99%.   Wt Readings from Last 3 Encounters:  11/29/20 151 lb 1.9 oz (68.5 kg)  10/03/20 149 lb 12.8 oz (67.9 kg)  08/29/20 141 lb (64 kg)    Physical Exam Vitals reviewed.  HENT:     Head: Normocephalic and atraumatic.  Eyes:     Pupils: Pupils are equal, round, and reactive to light.  Cardiovascular:     Rate and Rhythm: Normal rate and regular rhythm.     Heart sounds: Normal heart sounds.  Pulmonary:     Effort: Pulmonary effort is normal.     Breath sounds: Normal breath sounds.  Abdominal:     General: Bowel sounds are normal.     Palpations: Abdomen is soft.  Musculoskeletal:        General: No tenderness or deformity. Normal range of motion.     Cervical back: Normal range of motion.  Lymphadenopathy:     Cervical: No cervical adenopathy.  Skin:    General: Skin is warm and dry.     Findings: No erythema or rash.  Neurological:     Mental Status: She is alert and oriented to person, place, and time.  Psychiatric:        Behavior: Behavior normal.        Thought Content: Thought content normal.        Judgment: Judgment normal.     Lab Results  Component Value Date   WBC 7.9 11/29/2020   HGB 12.3 11/29/2020   HCT 36.7 11/29/2020   MCV 82.7 11/29/2020   PLT 357 11/29/2020     Chemistry      Component Value Date/Time   NA 140 08/29/2020 0954   NA 146 (H) 05/26/2019 1108   K 3.7 08/29/2020 0954   CL 103 08/29/2020 0954   CO2 29 08/29/2020 0954   BUN 21 08/29/2020 0954   BUN 17 05/26/2019 1108   CREATININE 0.88 08/29/2020 0954   CREATININE 0.82 12/07/2019 1429   GLU 91 06/23/2018 0000      Component Value Date/Time   CALCIUM 9.6  08/29/2020 0954   CALCIUM 12.3 (H) 06/16/2018 1639   ALKPHOS 86 08/29/2020 0954   AST 16 08/29/2020 0954   ALT 12 08/29/2020 0954   BILITOT 0.3 08/29/2020 0954      Impression and Plan: Amanda [name] is a very charming 80 year old white female.  She has a transformed marginal zone lymphoma.  She will get her maintenance Rituxan.  She will complete her maintenance  Rituxan with this cycle.  I think this would be adequate for her..  I do want to get a PET scan on her.  I will set the PET scan up in December.  Again, her big problem is this back issue.  We will see what her iron levels are.  If she has low iron, we will give her dose of IV iron.  I would like to see her back in December.  If everything looks fine, then hopefully we can start moving her appointments out later on later.       Volanda Napoleon, MD 9/28/20229:59 AM

## 2020-11-29 NOTE — Patient Instructions (Signed)
Tunneled Central Venous Catheter Flushing Guide It is important to flush your tunneled central venous catheter each time you use it, both before and after you use it. Flushing your catheter will help prevent it from clogging. What are the risks? Risks may include: Infection. Air getting into the catheter and bloodstream. Supplies needed: A clean pair of gloves. A disinfecting wipe. Use an alcohol wipe, chlorhexidine wipe, or iodine wipe as told by your health care provider. A 10 mL syringe that has been prefilled with saline solution. An empty 10 mL syringe, if a substance called heparin was injected into your catheter. How to flush your catheter When you flush your catheter, make sure you follow any specific instructions from your health care provider or the manufacturer. These are general guidelines. Flushing your catheter before use If there is heparin in your catheter: Wash your hands with soap and water. Put on gloves. Scrub the injection cap for a minimum of 15 seconds with a disinfecting wipe. Unclamp the catheter. Attach the empty syringe to the injection cap. Pull the syringe plunger back and withdraw 10 mL of blood. Place the syringe into an appropriate waste container. Scrub the injection cap for 15 seconds with a disinfecting wipe. Attach the prefilled syringe to the injection cap. Flush the catheter by pushing the plunger forward until all the liquid from the syringe is in the catheter. Remove the syringe from the injection cap. Clamp the catheter. If there is no heparin in your catheter: Wash your hands with soap and water. Put on gloves. Scrub the injection cap for 15 seconds with a disinfecting wipe. Unclamp the catheter. Attach the prefilled syringe to the injection cap. Flush the catheter by pushing the plunger forward until 5 mL of the liquid from the syringe is in the catheter. Pull back on the syringe until you see blood in the catheter. If you have been asked  to collect any blood, follow your health care provider's instructions. Otherwise, flush the catheter with the rest of the solution from the syringe. Remove the syringe from the injection cap. Clamp the catheter.  Flushing your catheter after use Wash your hands with soap and water. Put on gloves. Scrub the injection cap for 15 seconds with a disinfecting wipe. Unclamp the catheter. Attach the prefilled syringe to the injection cap. Flush the catheter by pushing the plunger forward until all of the liquid from the syringe is in the catheter. Remove the syringe from the injection cap. Clamp the catheter. Problems and solutions If blood cannot be completely cleared from the injection cap, you may need to have the injection cap replaced. If the catheter is difficult to flush, use the pulsing method. The pulsing method involves pushing only a few milliliters of solution into the catheter at a time and pausing between pushes. If you do not see blood in the catheter when you pull back on the syringe, change your body position, such as by raising your arms above your head. Take a deep breath and cough. Then, pull back on the syringe. If you still do not see blood, flush the catheter with a small amount of solution. Then, change positions again and take a breath or cough. Pull back on the syringe again. If you still do not see blood, finish flushing the catheter and contact your health care provider. Do not use your catheter until your health care provider says it is okay. General tips Have someone help you flush your catheter, if possible. Do not force fluid   through your catheter. Do not use a syringe that is larger or smaller than 10 mL. Using a smaller syringe can make the catheter burst. Do not use your catheter without flushing it first if it has heparin in it. Contact a health care provider if: You cannot see any blood in the catheter when you flush it before using it. Your catheter is difficult  to flush. Get help right away if: You cannot flush the catheter. The catheter leaks when you flush it or when there is fluid in it. There are cracks or breaks in the catheter. Summary It is important to flush your tunneled central venous catheter each time you use it, both before and after you use it. Scrub the injection cap for 15 seconds with a disinfecting wipe before and after you flush it. When you flush your catheter, make sure you follow any specific instructions from your health care provider or the manufacturer. Get help right away if you cannot flush the catheter. This information is not intended to replace advice given to you by your health care provider. Make sure you discuss any questions you have with your health care provider. Document Revised: 04/29/2019 Document Reviewed: 05/06/2018 Elsevier Patient Education  2022 Elsevier Inc.  

## 2020-11-29 NOTE — Patient Instructions (Signed)
[facility]  Discharge Instructions: ?Thank you for choosing [facility] to provide your oncology and hematology care.  ? ?If you have a lab appointment with the Cancer Center, please go directly to the Cancer Center and check in at the registration area. ? ?Wear comfortable clothing and clothing appropriate for easy access to any Portacath or PICC line.  ? ?We strive to give you quality time with your provider. You may need to reschedule your appointment if you arrive late (15 or more minutes).  Arriving late affects you and other patients whose appointments are after yours.  Also, if you miss three or more appointments without notifying the office, you may be dismissed from the clinic at the provider?s discretion.    ?  ?For prescription refill requests, have your pharmacy contact our office and allow 72 hours for refills to be completed.   ? ?Today you received the following chemotherapy and/or immunotherapy agents Rituxan  ?  ?To help prevent nausea and vomiting after your treatment, we encourage you to take your nausea medication as directed. ? ?BELOW ARE SYMPTOMS THAT SHOULD BE REPORTED IMMEDIATELY: ?*FEVER GREATER THAN 100.4 F (38 ?C) OR HIGHER ?*CHILLS OR SWEATING ?*NAUSEA AND VOMITING THAT IS NOT CONTROLLED WITH YOUR NAUSEA MEDICATION ?*UNUSUAL SHORTNESS OF BREATH ?*UNUSUAL BRUISING OR BLEEDING ?*URINARY PROBLEMS (pain or burning when urinating, or frequent urination) ?*BOWEL PROBLEMS (unusual diarrhea, constipation, pain near the anus) ?TENDERNESS IN MOUTH AND THROAT WITH OR WITHOUT PRESENCE OF ULCERS (sore throat, sores in mouth, or a toothache) ?UNUSUAL RASH, SWELLING OR PAIN  ?UNUSUAL VAGINAL DISCHARGE OR ITCHING  ? ?Items with * indicate a potential emergency and should be followed up as soon as possible or go to the Emergency Department if any problems should occur. ? ?Please show the CHEMOTHERAPY ALERT CARD or IMMUNOTHERAPY ALERT CARD at check-in to the  Emergency Department and triage nurse. ?Should you have questions after your visit or need to cancel or reschedule your appointment, please contact [facility]  [phone] and follow the prompts.  Office hours are 8:00 a.m. to 4:30 p.m. Monday - Friday. Please note that voicemails left after 4:00 p.m. may not be returned until the following business day.  We are closed weekends and major holidays. You have access to a nurse at all times for urgent questions. Please call the main number to the clinic [phone] and follow the prompts. ? ?For any non-urgent questions, you may also contact your provider using MyChart. We now offer e-Visits for anyone 18 and older to request care online for non-urgent symptoms. For details visit [email or web address]. ?  ?Also download the MyChart app! Go to the app store, search "MyChart", open the app, select [facility], and log in with your MyChart username and password. ? ?Due to Covid, a mask is required upon entering the hospital/clinic. If you do not have a mask, one will be given to you upon arrival. For doctor visits, patients may have 1 support person aged 18 or older with them. For treatment visits, patients cannot have anyone with them due to current Covid guidelines and our immunocompromised population.  ?

## 2020-11-30 ENCOUNTER — Telehealth: Payer: Self-pay | Admitting: *Deleted

## 2020-11-30 NOTE — Telephone Encounter (Signed)
No 11/29/20 los

## 2020-12-04 ENCOUNTER — Ambulatory Visit: Payer: Medicare Other | Attending: Physical Medicine and Rehabilitation | Admitting: Occupational Therapy

## 2020-12-04 ENCOUNTER — Telehealth: Payer: Self-pay | Admitting: *Deleted

## 2020-12-04 DIAGNOSIS — M79601 Pain in right arm: Secondary | ICD-10-CM | POA: Diagnosis not present

## 2020-12-04 DIAGNOSIS — R293 Abnormal posture: Secondary | ICD-10-CM | POA: Diagnosis not present

## 2020-12-04 DIAGNOSIS — R29898 Other symptoms and signs involving the musculoskeletal system: Secondary | ICD-10-CM | POA: Diagnosis not present

## 2020-12-04 DIAGNOSIS — R2681 Unsteadiness on feet: Secondary | ICD-10-CM | POA: Insufficient documentation

## 2020-12-04 DIAGNOSIS — M25561 Pain in right knee: Secondary | ICD-10-CM | POA: Insufficient documentation

## 2020-12-04 DIAGNOSIS — M6281 Muscle weakness (generalized): Secondary | ICD-10-CM | POA: Diagnosis not present

## 2020-12-04 DIAGNOSIS — G8929 Other chronic pain: Secondary | ICD-10-CM | POA: Insufficient documentation

## 2020-12-04 NOTE — Telephone Encounter (Signed)
Per Dr Marin Olp, Patient does not need iron at this time.  LMAM for her to call us back.  Unable to leave message on cell phone but left message on home answering machine

## 2020-12-05 ENCOUNTER — Other Ambulatory Visit: Payer: Self-pay

## 2020-12-05 ENCOUNTER — Encounter (HOSPITAL_BASED_OUTPATIENT_CLINIC_OR_DEPARTMENT_OTHER): Payer: Self-pay | Admitting: Physical Therapy

## 2020-12-05 ENCOUNTER — Encounter: Payer: Self-pay | Admitting: Occupational Therapy

## 2020-12-05 ENCOUNTER — Ambulatory Visit (HOSPITAL_BASED_OUTPATIENT_CLINIC_OR_DEPARTMENT_OTHER): Payer: Medicare Other | Attending: Family Medicine | Admitting: Physical Therapy

## 2020-12-05 DIAGNOSIS — M545 Low back pain, unspecified: Secondary | ICD-10-CM | POA: Diagnosis not present

## 2020-12-05 DIAGNOSIS — M6281 Muscle weakness (generalized): Secondary | ICD-10-CM | POA: Insufficient documentation

## 2020-12-05 DIAGNOSIS — R2689 Other abnormalities of gait and mobility: Secondary | ICD-10-CM | POA: Diagnosis not present

## 2020-12-05 DIAGNOSIS — R262 Difficulty in walking, not elsewhere classified: Secondary | ICD-10-CM | POA: Diagnosis not present

## 2020-12-05 DIAGNOSIS — R2681 Unsteadiness on feet: Secondary | ICD-10-CM | POA: Diagnosis not present

## 2020-12-05 DIAGNOSIS — R293 Abnormal posture: Secondary | ICD-10-CM | POA: Diagnosis not present

## 2020-12-05 DIAGNOSIS — G8929 Other chronic pain: Secondary | ICD-10-CM | POA: Insufficient documentation

## 2020-12-05 DIAGNOSIS — M542 Cervicalgia: Secondary | ICD-10-CM | POA: Insufficient documentation

## 2020-12-05 DIAGNOSIS — M25561 Pain in right knee: Secondary | ICD-10-CM | POA: Insufficient documentation
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
## 2020-12-13 ENCOUNTER — Ambulatory Visit (HOSPITAL_BASED_OUTPATIENT_CLINIC_OR_DEPARTMENT_OTHER): Payer: Medicare Other | Admitting: Physical Therapy

## 2020-12-14 ENCOUNTER — Other Ambulatory Visit: Payer: Self-pay | Admitting: Family

## 2020-12-14 ENCOUNTER — Ambulatory Visit (HOSPITAL_BASED_OUTPATIENT_CLINIC_OR_DEPARTMENT_OTHER)
Admission: RE | Admit: 2020-12-14 | Discharge: 2020-12-14 | Disposition: A | Discharge status: Home / Self Care | Payer: Medicare Other | Source: Ambulatory Visit | Attending: Family | Admitting: Family

## 2020-12-14 ENCOUNTER — Other Ambulatory Visit: Payer: Self-pay

## 2020-12-14 ENCOUNTER — Encounter: Payer: Self-pay | Admitting: Family Medicine

## 2020-12-14 ENCOUNTER — Ambulatory Visit (INDEPENDENT_AMBULATORY_CARE_PROVIDER_SITE_OTHER): Payer: Medicare Other | Admitting: Family

## 2020-12-14 VITALS — BP 140/92 | HR 77 | Temp 98.3°F | Resp 18 | Ht 60.0 in | Wt 149.8 lb

## 2020-12-14 DIAGNOSIS — R059 Cough, unspecified: Secondary | ICD-10-CM | POA: Diagnosis not present

## 2020-12-14 DIAGNOSIS — J209 Acute bronchitis, unspecified: Secondary | ICD-10-CM | POA: Diagnosis not present

## 2020-12-14 DIAGNOSIS — R9389 Abnormal findings on diagnostic imaging of other specified body structures: Secondary | ICD-10-CM | POA: Insufficient documentation

## 2020-12-14 DIAGNOSIS — R918 Other nonspecific abnormal finding of lung field: Secondary | ICD-10-CM | POA: Diagnosis not present

## 2020-12-14 DIAGNOSIS — R509 Fever, unspecified: Secondary | ICD-10-CM | POA: Diagnosis not present

## 2020-12-14 MED ORDER — DOXYCYCLINE HYCLATE 100 MG PO TABS: 100.0000 mg | ORAL_TABLET | Freq: Two times a day (BID) | ORAL | 0 refills | Status: DC

## 2020-12-14 MED ORDER — BENZONATATE 100 MG PO CAPS: 100.0000 mg | ORAL_CAPSULE | Freq: Three times a day (TID) | ORAL | 0 refills | Status: DC | PRN

## 2020-12-14 MED ORDER — PREDNISONE 20 MG PO TABS: 20.0000 mg | ORAL_TABLET | Freq: Every day | ORAL | 0 refills | Status: DC
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
## 2020-12-15 ENCOUNTER — Other Ambulatory Visit: Payer: Self-pay | Admitting: Family

## 2020-12-15 MED ORDER — DOXYCYCLINE HYCLATE 100 MG PO TABS: 100.0000 mg | ORAL_TABLET | Freq: Two times a day (BID) | ORAL | 0 refills | Status: DC

## 2020-12-19 ENCOUNTER — Other Ambulatory Visit: Payer: Self-pay | Admitting: Physical Medicine and Rehabilitation

## 2020-12-20 ENCOUNTER — Other Ambulatory Visit: Payer: Self-pay

## 2020-12-20 ENCOUNTER — Ambulatory Visit (HOSPITAL_BASED_OUTPATIENT_CLINIC_OR_DEPARTMENT_OTHER): Payer: Medicare Other | Admitting: Physical Therapy

## 2020-12-20 ENCOUNTER — Encounter (HOSPITAL_BASED_OUTPATIENT_CLINIC_OR_DEPARTMENT_OTHER): Payer: Self-pay | Admitting: Physical Therapy

## 2020-12-20 DIAGNOSIS — R2689 Other abnormalities of gait and mobility: Secondary | ICD-10-CM

## 2020-12-20 DIAGNOSIS — M6281 Muscle weakness (generalized): Secondary | ICD-10-CM | POA: Diagnosis not present

## 2020-12-20 DIAGNOSIS — G8929 Other chronic pain: Secondary | ICD-10-CM

## 2020-12-20 DIAGNOSIS — R2681 Unsteadiness on feet: Secondary | ICD-10-CM

## 2020-12-20 DIAGNOSIS — R293 Abnormal posture: Secondary | ICD-10-CM

## 2020-12-20 DIAGNOSIS — R262 Difficulty in walking, not elsewhere classified: Secondary | ICD-10-CM
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
## 2020-12-25 ENCOUNTER — Encounter: Payer: Self-pay | Admitting: Occupational Therapy

## 2020-12-25 ENCOUNTER — Other Ambulatory Visit: Payer: Self-pay

## 2020-12-25 ENCOUNTER — Ambulatory Visit: Payer: Medicare Other | Admitting: Occupational Therapy

## 2020-12-25 DIAGNOSIS — R293 Abnormal posture: Secondary | ICD-10-CM

## 2020-12-25 DIAGNOSIS — M6281 Muscle weakness (generalized): Secondary | ICD-10-CM

## 2020-12-25 DIAGNOSIS — R29898 Other symptoms and signs involving the musculoskeletal system: Secondary | ICD-10-CM

## 2020-12-25 DIAGNOSIS — M25561 Pain in right knee: Secondary | ICD-10-CM

## 2020-12-25 DIAGNOSIS — M79601 Pain in right arm: Secondary | ICD-10-CM

## 2020-12-25 DIAGNOSIS — G8929 Other chronic pain: Secondary | ICD-10-CM

## 2020-12-25 DIAGNOSIS — R2681 Unsteadiness on feet: Secondary | ICD-10-CM
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
## 2020-12-27 ENCOUNTER — Ambulatory Visit (HOSPITAL_BASED_OUTPATIENT_CLINIC_OR_DEPARTMENT_OTHER): Payer: 59 | Admitting: Physical Therapy

## 2020-12-29 ENCOUNTER — Other Ambulatory Visit: Payer: Self-pay | Admitting: Family Medicine

## 2020-12-29 DIAGNOSIS — K219 Gastro-esophageal reflux disease without esophagitis: Secondary | ICD-10-CM

## 2021-01-01 ENCOUNTER — Ambulatory Visit: Payer: Medicare Other | Attending: Physical Medicine and Rehabilitation | Admitting: Occupational Therapy

## 2021-01-01 ENCOUNTER — Other Ambulatory Visit: Payer: Self-pay

## 2021-01-01 ENCOUNTER — Encounter: Payer: Self-pay | Admitting: Occupational Therapy

## 2021-01-01 DIAGNOSIS — G8929 Other chronic pain: Secondary | ICD-10-CM | POA: Insufficient documentation

## 2021-01-01 DIAGNOSIS — M25561 Pain in right knee: Secondary | ICD-10-CM | POA: Insufficient documentation

## 2021-01-01 DIAGNOSIS — M6281 Muscle weakness (generalized): Secondary | ICD-10-CM | POA: Diagnosis not present

## 2021-01-01 DIAGNOSIS — R29898 Other symptoms and signs involving the musculoskeletal system: Secondary | ICD-10-CM | POA: Insufficient documentation

## 2021-01-01 DIAGNOSIS — M79601 Pain in right arm: Secondary | ICD-10-CM | POA: Insufficient documentation

## 2021-01-01 DIAGNOSIS — R293 Abnormal posture: Secondary | ICD-10-CM | POA: Insufficient documentation
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
## 2021-01-03 ENCOUNTER — Other Ambulatory Visit: Payer: Self-pay

## 2021-01-03 ENCOUNTER — Encounter (HOSPITAL_BASED_OUTPATIENT_CLINIC_OR_DEPARTMENT_OTHER): Payer: Self-pay | Admitting: Physical Therapy

## 2021-01-03 ENCOUNTER — Ambulatory Visit (HOSPITAL_BASED_OUTPATIENT_CLINIC_OR_DEPARTMENT_OTHER): Payer: Medicare Other | Attending: Family Medicine | Admitting: Physical Therapy

## 2021-01-03 DIAGNOSIS — M6281 Muscle weakness (generalized): Secondary | ICD-10-CM | POA: Diagnosis not present

## 2021-01-03 DIAGNOSIS — R2681 Unsteadiness on feet: Secondary | ICD-10-CM | POA: Diagnosis not present

## 2021-01-03 DIAGNOSIS — R2689 Other abnormalities of gait and mobility: Secondary | ICD-10-CM | POA: Diagnosis not present

## 2021-01-03 DIAGNOSIS — M25561 Pain in right knee: Secondary | ICD-10-CM | POA: Insufficient documentation

## 2021-01-03 DIAGNOSIS — R262 Difficulty in walking, not elsewhere classified: Secondary | ICD-10-CM | POA: Diagnosis not present

## 2021-01-03 DIAGNOSIS — G8929 Other chronic pain: Secondary | ICD-10-CM | POA: Insufficient documentation

## 2021-01-03 DIAGNOSIS — R293 Abnormal posture: Secondary | ICD-10-CM | POA: Diagnosis not present
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
## 2021-01-04 ENCOUNTER — Ambulatory Visit (INDEPENDENT_AMBULATORY_CARE_PROVIDER_SITE_OTHER): Payer: Medicare Other | Admitting: Family Medicine

## 2021-01-04 ENCOUNTER — Encounter: Payer: Self-pay | Admitting: Family Medicine

## 2021-01-04 VITALS — BP 110/80 | HR 79 | Temp 97.9°F | Resp 18 | Ht 60.0 in | Wt 147.8 lb

## 2021-01-04 DIAGNOSIS — R0602 Shortness of breath: Secondary | ICD-10-CM

## 2021-01-04 DIAGNOSIS — I10 Essential (primary) hypertension: Secondary | ICD-10-CM

## 2021-01-04 DIAGNOSIS — E785 Hyperlipidemia, unspecified: Secondary | ICD-10-CM

## 2021-01-04 DIAGNOSIS — Z23 Encounter for immunization: Secondary | ICD-10-CM | POA: Diagnosis not present

## 2021-01-04 LAB — LIPID PANEL
Cholesterol: 222 mg/dL — ABNORMAL HIGH (ref 0–200)
HDL: 68.3 mg/dL (ref >39.00)
LDL Cholesterol: 125 mg/dL — ABNORMAL HIGH (ref 0–99)
NonHDL: 154.19
Total CHOL/HDL Ratio: 3
Triglycerides: 146 mg/dL (ref 0.0–149.0)
VLDL: 29.2 mg/dL (ref 0.0–40.0)

## 2021-01-04 LAB — COMPREHENSIVE METABOLIC PANEL
ALT: 15 U/L (ref 0–35)
AST: 16 U/L (ref 0–37)
Albumin: 4.2 g/dL (ref 3.5–5.2)
Alkaline Phosphatase: 79 U/L (ref 39–117)
BUN: 18 mg/dL (ref 6–23)
CO2: 30 mEq/L (ref 19–32)
Calcium: 9.7 mg/dL (ref 8.4–10.5)
Chloride: 103 mEq/L (ref 96–112)
Creatinine, Ser: 1 mg/dL (ref 0.40–1.20)
GFR: 52.64 mL/min — ABNORMAL LOW (ref >60.00)
Glucose, Bld: 74 mg/dL (ref 70–99)
Potassium: 4.6 mEq/L (ref 3.5–5.1)
Sodium: 141 mEq/L (ref 135–145)
Total Bilirubin: 0.4 mg/dL (ref 0.2–1.2)
Total Protein: 6.1 g/dL (ref 6.0–8.3)

## 2021-01-04 MED ORDER — AMLODIPINE BESYLATE 10 MG PO TABS: 10.0000 mg | ORAL_TABLET | Freq: Every day | ORAL | 1 refills | Status: DC

## 2021-01-04 NOTE — Assessment & Plan Note (Signed)
Pt recently had a treadmill test for a study she was in and she states it was normal  Check echo Will recheck cxr

## 2021-01-04 NOTE — Patient Instructions (Signed)
Shortness of Breath, Adult Shortness of breath is when a person has trouble breathing enough air or when a person feels like she or he is having trouble breathing in enough air.Shortness of breath could be a sign of a medical problem. Follow these instructions at home:  Pay attention to any changes in your symptoms. Do not use any products that contain nicotine or tobacco, such as cigarettes, e-cigarettes, and chewing tobacco. Do not smoke. Smoking is a common cause of shortness of breath. If you need help quitting, ask your health care provider. Avoid things that can irritate your airways, such as: Mold. Dust. Air pollution. Chemical fumes. Things that can cause allergy symptoms (allergens), if you have allergies. Keep your living space clean and free of mold and dust. Rest as needed. Slowly return to your usual activities. Take over-the-counter and prescription medicines only as told by your health care provider. This includes oxygen therapy and inhaled medicines. Keep all follow-up visits as told by your health care provider. This is important. Contact a health care provider if: Your condition does not improve as soon as expected. You have a hard time doing your normal activities, even after you rest. You have new symptoms. Get help right away if: Your shortness of breath gets worse. You have shortness of breath when you are resting. You feel light-headed or you faint. You have a cough that is not controlled with medicines. You cough up blood. You have pain with breathing. You have pain in your chest, arms, shoulders, or abdomen. You have a fever. You cannot walk up stairs or exercise the way that you normally do. These symptoms may represent a serious problem that is an emergency. Do not wait to see if the symptoms will go away. Get medical help right away. Call your local emergency services (911 in the [place] Do not drive yourself to the hospital. Summary Shortness of breath is  when a person has trouble breathing enough air. It can be a sign of a medical problem. Avoid things that irritate your lungs, such as smoking, pollution, mold, and dust. Pay attention to changes in your symptoms and contact your health care provider if you have a hard time completing daily activities because of shortness of breath. This information is not intended to replace advice given to you by your health care provider. Make sure you discuss any questions you have with your healthcare provider. Document Revised: 07/21/2017 Document Reviewed: 07/21/2017 Elsevier Patient Education  2022 [employer] Inc.  

## 2021-01-04 NOTE — Assessment & Plan Note (Signed)
Encourage heart healthy diet such as MIND or DASH diet, increase exercise, avoid trans fats, simple carbohydrates and processed foods, consider a krill or fish or flaxseed oil cap daily.  °

## 2021-01-04 NOTE — Progress Notes (Signed)
Subjective:   By signing my name below, I, [name], attest that this documentation has been prepared under the direction and in the presence of Dr. Roma Schanz, DO. 01/04/2021    Patient ID: Amanda [name], female    DOB: May 29, 1940, 80 y.o.   MRN: [id number]  Chief Complaint  Patient presents with   Bronchitis    Pt states feeling better but states still having some fatigue    Follow-up    HPI Patient is in today for a office visit.   Fatigue- She continues having fatigue. She gets out of breath easily.  Back pain- She continues having lower back pain. She has more pain on her lower left side. She has relief when sitting down. She is attending water aerobic physical therapy to help manage her pain. Her fitness goal is to get fit enough to go on a cruise in spring 2023.  Bronchitis- Her bronchitis symptoms have improved at this time. She reports doing better.  Vision- She reports her vision is decreasing. She has trouble driving and now has her husband driving her. She has an upcomming appointment with her ophthalmologist.  Blood pressure- Her blood pressure is doing well during this visit. She continues taking 10 mg amlodipine daily PO and reports no new issues while taking it.   BP Readings from Last 3 Encounters:  01/04/21 110/80  12/14/20 (!) 140/92  11/29/20 (!) 143/77   Pulse Readings from Last 3 Encounters:  01/04/21 79  12/14/20 77  11/29/20 66   Immunizations- She is interested in receiving a flu vaccine during this visit.   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[place])    Osteopenia    Pneumonia    Pneumonia due to COVID-19 virus 2021   Required hospitalization   PONV (postoperative nausea and vomiting)    needs little anesthesia   Shingles    Shortness of breath    on exertion   Spleen enlarged    SUI (stress urinary incontinence, female)    Urinary, incontinence, stress female    Wears glasses     Past Surgical History:  Procedure Laterality Date   BREAST EXCISIONAL BIOPSY Left [place]   CATARACT EXTRACTION  2009, 2011   BOTH EYES   CATARACT EXTRACTION, BILATERAL     CESAREAN SECTION  1959   CESAREAN SECTION     COLONOSCOPY      Dr Cristina Gong   DILATION AND CURETTAGE OF UTERUS     X2   HYSTEROSCOPY WITH D & C  01/07/2012   Procedure: DILATATION AND CURETTAGE /HYSTEROSCOPY;  Surgeon: Terrance Mass, MD;  Location: [facility];  Service: Gynecology;  Laterality: N/A;  intrauterine foley catheter for tamponode    IR IMAGING GUIDED PORT INSERTION  07/15/2018   LYMPH NODE BIOPSY Left  05/26/2018   Procedure: LEFT AXILLARY LYMPH NODE BIOPSY;  Surgeon: Fanny Skates, MD;  Location: [facility];  Service: General;  Laterality: Left;   ORIF ANKLE FRACTURE Left 12/12/2018   ORIF ANKLE FRACTURE Left 12/12/2018   Procedure: OPEN REDUCTION INTERNAL FIXATION (ORIF) ANKLE FRACTURE;  Surgeon: Meredith Pel, MD;  Location: [facility];  Service: Orthopedics;  Laterality: Left;   ORIF ANKLE FRACTURE Left 12/2018   TONSILLECTOMY     TONSILLECTOMY AND ADENOIDECTOMY     TUBAL LIGATION     BY LAPAROSCOPY   WISDOM TOOTH EXTRACTION      Family History  Problem Relation Age of Onset   Ovarian cancer Mother    Breast cancer Mother 55   Hypertension Father    Prostate cancer Father    Kidney failure Father    Diabetes Father    Hyperlipidemia Brother    COPD Paternal Grandfather    Stroke  Maternal Grandfather    Hypercalcemia Neg Hx     Social History   Socioeconomic History   Marital status: Married    Spouse name: Not on file   Number of children: Not on file   Years of education: Not on file   Highest education level: Not on file  Occupational History   Not on file  Tobacco Use   Smoking status: Never   Smokeless tobacco: Never  Vaping Use   Vaping Use: Never used  Substance and Sexual Activity   Alcohol use: Yes    Comment: RARE   Drug use: Never   Sexual activity: Never    Birth control/protection: Post-menopausal  Other Topics Concern   Not on file  Social History Narrative   ** Merged History Encounter **       ** Merged History Encounter **       Social Determinants of Health   Financial Resource Strain: Low Risk    Difficulty of Paying Living Expenses: Not very hard  Food Insecurity: No Food Insecurity   Worried About Charity fundraiser in the Last Year: Never true   Ran Out of Food in the Last Year: Never true  Transportation Needs: Not on file  Physical Activity: Insufficiently Active   Days of Exercise per Week: 2 days   Minutes of Exercise per Session: 30 min  Stress: Not on file  Social Connections: Not on file  Intimate Partner Violence: Not on file    Outpatient Medications Prior to Visit  Medication Sig Dispense Refill   acetaminophen (TYLENOL) 325 MG tablet Take 1-2 tablets (325-650 mg total) by mouth every 8 (eight) hours as needed for mild pain.     aspirin 81 MG chewable tablet Chew 1 tablet (81 mg total) by mouth daily.     buPROPion (WELLBUTRIN XL) 150 MG 24 hr tablet TAKE 1 TABLET(150 MG) BY MOUTH DAILY 90 tablet 1   buPROPion (WELLBUTRIN XL) 150 MG 24 hr tablet TAKE 1 TABLET(150 MG) BY MOUTH DAILY 90 tablet 1   Cholecalciferol (VITAMIN D3 ADULT GUMMIES) 25 MCG (1000 UT) CHEW Chew 2 each by mouth daily.     levothyroxine (SYNTHROID) 125 MCG tablet TAKE 1 TABLET(125 MCG) BY MOUTH DAILY BEFORE AND BREAKFAST 90 tablet 1    lidocaine-prilocaine (EMLA) cream Apply 1 application topically as needed.     methocarbamol (ROBAXIN) 500 MG tablet TAKE 1-2 TABLETS BY MOUTH THREE TIMES DAILY. AFTER A FEW DAYS DECREASE TO TWICE DAILY THEN AGAIN DECREASE TO ONCE DAILY AS NEEDED 60 tablet 1  Multiple Vitamins-Minerals (MACULAR HEALTH FORMULA) CAPS Take 1 capsule by mouth in the morning and at bedtime.     pantoprazole (PROTONIX) 40 MG tablet TAKE 1 TABLET(40 MG) BY MOUTH DAILY 90 tablet 1   senna-docusate (SENOKOT-S) 8.6-50 MG tablet Take 2 tablets by mouth at bedtime. 60 tablet 0   amLODipine (NORVASC) 10 MG tablet Take 1 tablet (10 mg total) by mouth daily. 90 tablet 0   benzonatate (TESSALON) 100 MG capsule Take 1 capsule (100 mg total) by mouth 3 (three) times daily as needed. (Patient not taking: Reported on 01/04/2021) 20 capsule 0   doxycycline (VIBRA-TABS) 100 MG tablet Take 1 tablet (100 mg total) by mouth 2 (two) times daily. (Patient not taking: Reported on 01/04/2021) 6 tablet 0   predniSONE (DELTASONE) 20 MG tablet Take 1 tablet (20 mg total) by mouth daily with breakfast. (Patient not taking: Reported on 01/04/2021) 5 tablet 0   Facility-Administered Medications Prior to Visit  Medication Dose Route Frequency Provider Last Rate Last Admin   diphenhydrAMINE (BENADRYL) capsule 50 mg  50 mg Oral Once Tish Men, MD       heparin lock flush 100 unit/mL  500 Units Intracatheter Once PRN Tish Men, MD       sodium chloride flush (NS) 0.9 % injection 10 mL  10 mL Intracatheter PRN Tish Men, MD        Allergies  Allergen Reactions   Phenergan [Promethazine Hcl] Other (See Comments)    Jerking/agitation   Preservision Areds 2 [Multiple Vitamins-Minerals] Nausea Only   Promethazine Other (See Comments)    "Agitation and jerking"   Levaquin [Levofloxacin] Nausea And Vomiting   Levofloxacin Nausea And Vomiting   Pravastatin Nausea And Vomiting   Pravastatin Nausea And Vomiting   Preservision Areds 2 [Multiple  Vitamins-Minerals] Nausea Only    Review of Systems  Constitutional:  Negative for fever and malaise/fatigue.  HENT:  Negative for congestion.   Eyes:  Negative for blurred vision.  Respiratory:  Positive for shortness of breath.   Cardiovascular:  Negative for chest pain, palpitations and leg swelling.  Gastrointestinal:  Negative for abdominal pain, blood in stool and nausea.  Genitourinary:  Negative for dysuria and frequency.  Musculoskeletal:  Negative for falls.  Skin:  Negative for rash.  Neurological:  Negative for dizziness, loss of consciousness and headaches.  Endo/Heme/Allergies:  Negative for environmental allergies.  Psychiatric/Behavioral:  Negative for depression. The patient is not nervous/anxious.       Objective:    Physical Exam Vitals and nursing note reviewed.  Constitutional:      General: She is not in acute distress.    Appearance: Normal appearance. She is not ill-appearing.  HENT:     Head: Normocephalic and atraumatic.     Right Ear: External ear normal.     Left Ear: External ear normal.  Eyes:     Extraocular Movements: Extraocular movements intact.     Pupils: Pupils are equal, round, and reactive to light.  Cardiovascular:     Rate and Rhythm: Normal rate and regular rhythm.     Heart sounds: Murmur heard.  Systolic murmur is present with a grade of 1/6.    No gallop.  Pulmonary:     Effort: Pulmonary effort is normal. No respiratory distress.     Breath sounds: Normal breath sounds. No wheezing or rales.  Skin:    General: Skin is warm and dry.  Neurological:     Mental Status: She is alert and  oriented to person, place, and time.  Psychiatric:        Behavior: Behavior normal.        Judgment: Judgment normal.    BP 110/80 (BP Location: Left Arm, Patient Position: Sitting, Cuff Size: Normal)   Pulse 79   Temp 97.9 F (36.6 C) (Oral)   Resp 18   Ht 5' (1.524 m)   Wt 147 lb 12.8 oz (67 kg)   SpO2 98%   BMI 28.87 kg/m  Wt  Readings from Last 3 Encounters:  01/04/21 147 lb 12.8 oz (67 kg)  12/14/20 149 lb 12.8 oz (67.9 kg)  11/29/20 151 lb 1.9 oz (68.5 kg)    Diabetic Foot Exam - Simple   No data filed    Lab Results  Component Value Date   WBC 7.9 11/29/2020   HGB 12.3 11/29/2020   HCT 36.7 11/29/2020   PLT 357 11/29/2020   GLUCOSE 111 (H) 11/29/2020   CHOL 225 (H) 04/27/2020   TRIG 218.0 (H) 04/27/2020   HDL 63.00 04/27/2020   LDLDIRECT 123.0 04/27/2020   LDLCALC 84 12/07/2019   ALT 10 11/29/2020   AST 12 (L) 11/29/2020   NA 143 11/29/2020   K 4.1 11/29/2020   CL 105 11/29/2020   CREATININE 0.92 11/29/2020   BUN 17 11/29/2020   CO2 28 11/29/2020   TSH 12.79 (H) 04/27/2020   INR 1.0 04/01/2020   HGBA1C 6.1 (H) 11/18/2014    Lab Results  Component Value Date   TSH 12.79 (H) 04/27/2020   Lab Results  Component Value Date   WBC 7.9 11/29/2020   HGB 12.3 11/29/2020   HCT 36.7 11/29/2020   MCV 82.7 11/29/2020   PLT 357 11/29/2020   Lab Results  Component Value Date   NA 143 11/29/2020   K 4.1 11/29/2020   CO2 28 11/29/2020   GLUCOSE 111 (H) 11/29/2020   BUN 17 11/29/2020   CREATININE 0.92 11/29/2020   BILITOT 0.2 (L) 11/29/2020   ALKPHOS 82 11/29/2020   AST 12 (L) 11/29/2020   ALT 10 11/29/2020   PROT 6.3 (L) 11/29/2020   ALBUMIN 4.0 11/29/2020   CALCIUM 9.6 11/29/2020   ANIONGAP 10 11/29/2020   GFR 60.03 04/27/2020   Lab Results  Component Value Date   CHOL 225 (H) 04/27/2020   Lab Results  Component Value Date   HDL 63.00 04/27/2020   Lab Results  Component Value Date   LDLCALC 84 12/07/2019   Lab Results  Component Value Date   TRIG 218.0 (H) 04/27/2020   Lab Results  Component Value Date   CHOLHDL 4 04/27/2020   Lab Results  Component Value Date   HGBA1C 6.1 (H) 11/18/2014       Assessment & Plan:   Problem List Items Addressed This Visit       Unprioritized   Hyperlipidemia    Encourage heart healthy diet such as MIND or DASH diet,  increase exercise, avoid trans fats, simple carbohydrates and processed foods, consider a krill or fish or flaxseed oil cap daily.       Relevant Medications   amLODipine (NORVASC) 10 MG tablet   Other Relevant Orders   Comprehensive metabolic panel   Lipid panel   Primary hypertension - Primary    Well controlled, no changes to meds. Encouraged heart healthy diet such as the DASH diet and exercise as tolerated.       Relevant Medications   amLODipine (NORVASC) 10 MG tablet   Other  Relevant Orders   Comprehensive metabolic panel   Lipid panel   SOB (shortness of breath)    Pt recently had a treadmill test for a study she was in and she states it was normal  Check echo Will recheck cxr        Relevant Orders   ECHOCARDIOGRAM COMPLETE   DG Chest 2 View   Other Visit Diagnoses     Need for influenza vaccination       Relevant Orders   Flu Vaccine QUAD High Dose(Fluad) (Completed)        Meds ordered this encounter  Medications   amLODipine (NORVASC) 10 MG tablet    Sig: Take 1 tablet (10 mg total) by mouth daily.    Dispense:  90 tablet    Refill:  1    I, Dr. Roma Schanz, DO, personally preformed the services described in this documentation.  All medical record entries made by the scribe were at my direction and in my presence.  I have reviewed the chart and discharge instructions (if applicable) and agree that the record reflects my personal performance and is accurate and complete. 01/04/2021   I,[name],acting as a scribe for [name], DO.,have documented all relevant documentation on the behalf of [name], DO,as directed by  [name], DO while in the presence of [name], DO.   [name], DO

## 2021-01-04 NOTE — Assessment & Plan Note (Signed)
Well controlled, no changes to meds. Encouraged heart healthy diet such as the DASH diet and exercise as tolerated.  °

## 2021-01-08 ENCOUNTER — Ambulatory Visit: Payer: Medicare Other | Attending: Physical Medicine and Rehabilitation | Admitting: Occupational Therapy

## 2021-01-08 ENCOUNTER — Encounter: Payer: Self-pay | Admitting: Occupational Therapy

## 2021-01-08 ENCOUNTER — Other Ambulatory Visit: Payer: Self-pay

## 2021-01-08 DIAGNOSIS — R29898 Other symptoms and signs involving the musculoskeletal system: Secondary | ICD-10-CM | POA: Insufficient documentation

## 2021-01-08 DIAGNOSIS — M79601 Pain in right arm: Secondary | ICD-10-CM | POA: Diagnosis not present

## 2021-01-08 DIAGNOSIS — R293 Abnormal posture: Secondary | ICD-10-CM | POA: Insufficient documentation

## 2021-01-08 DIAGNOSIS — M6281 Muscle weakness (generalized): Secondary | ICD-10-CM | POA: Insufficient documentation

## 2021-01-08 DIAGNOSIS — M25561 Pain in right knee: Secondary | ICD-10-CM | POA: Insufficient documentation

## 2021-01-08 DIAGNOSIS — G8929 Other chronic pain: Secondary | ICD-10-CM | POA: Diagnosis not present
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
## 2021-01-11 ENCOUNTER — Telehealth: Payer: 59

## 2021-01-11 DIAGNOSIS — Z20822 Contact with and (suspected) exposure to covid-19: Secondary | ICD-10-CM | POA: Diagnosis not present

## 2021-01-15 ENCOUNTER — Encounter: Payer: Self-pay | Admitting: Occupational Therapy

## 2021-01-15 ENCOUNTER — Other Ambulatory Visit: Payer: Self-pay

## 2021-01-15 ENCOUNTER — Ambulatory Visit: Payer: Medicare Other | Admitting: Occupational Therapy

## 2021-01-15 DIAGNOSIS — M6281 Muscle weakness (generalized): Secondary | ICD-10-CM

## 2021-01-15 DIAGNOSIS — R293 Abnormal posture: Secondary | ICD-10-CM

## 2021-01-15 DIAGNOSIS — R29898 Other symptoms and signs involving the musculoskeletal system: Secondary | ICD-10-CM

## 2021-01-15 DIAGNOSIS — M25561 Pain in right knee: Secondary | ICD-10-CM | POA: Diagnosis not present

## 2021-01-15 DIAGNOSIS — M79601 Pain in right arm: Secondary | ICD-10-CM
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
## 2021-01-16 ENCOUNTER — Ambulatory Visit (INDEPENDENT_AMBULATORY_CARE_PROVIDER_SITE_OTHER): Payer: Medicare Other | Admitting: Pharmacist

## 2021-01-16 DIAGNOSIS — M810 Age-related osteoporosis without current pathological fracture: Secondary | ICD-10-CM

## 2021-01-16 DIAGNOSIS — E785 Hyperlipidemia, unspecified: Secondary | ICD-10-CM

## 2021-01-16 DIAGNOSIS — I10 Essential (primary) hypertension: Secondary | ICD-10-CM

## 2021-01-16 DIAGNOSIS — K219 Gastro-esophageal reflux disease without esophagitis: Secondary | ICD-10-CM
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
## 2021-01-16 NOTE — Patient Instructions (Addendum)
Visit Information  Hypertension BP Readings from Last 3 Encounters:  01/04/21 110/80  12/14/20 (!) 140/92  11/29/20 (!) 143/77   Pharmacist Clinical Goal(s): Over the next 90 days, patient will work with PharmD and providers to maintain BP goal <140/90 Current regimen:  Amlodipine 5mg  daily at breakfast Interventions: Requested patient to check blood pressure weekly and record Discussed effects of uncontrolled pain on blood pressure Patient self care activities - Over the next 90 days, patient will: Check blood pressure weekly, document, and provide at future appointments Ensure daily salt intake < 2300 mg/day  Hyperlipidemia Lab Results  Component Value Date/Time   LDLCALC 125 (H) 01/04/2021 08:55 AM   LDLCALC 84 12/07/2019 02:29 PM   LDLDIRECT 123.0 04/27/2020 02:01 PM   Pharmacist Clinical Goal(s): Over the next 90 days, patient will work with PharmD and providers to achieve LDL goal < 100 Current regimen:  Diet and exercise management   Interventions: Discussed diet and exercise Also discussed benefits of statin therapy - patient declines Patient self care activities - Over the next 90 days, patient will: Achieve LDL less than 100 Work on lowering intake of foods high in saturated or trans fat.   Pre-Diabetes Lab Results  Component Value Date/Time   HGBA1C 6.1 (H) 11/18/2014 12:57 AM   HGBA1C 5.8 11/29/2011 03:51 PM   Pharmacist Clinical Goal(s): Over the next 90 days, patient will work with PharmD and providers to maintain A1c goal <6.5% Current regimen:  Diet and exercise management   Interventions: Discussed diet and exercise Consider repeat a1c at next office visit Patient self care activities - Over the next 90 days, patient will: Maintain a1c <6.5% Consider rechecking A1c with next labs  GERD Pharmacist Clinical Goal(s) Over the next 90 days, patient will work with PharmD and providers to reduce symptoms associated with GERD and decrease  polypharmacy Current regimen:  Pantoprazole 40mg  daily  Interventions:  none Patient self care activities - Over the next 90 days, patient will: Continue to take pantoprazole 40mg  daily  Osteoporosis Pharmacist Clinical Goal(s) Over the next 180 days, patient will work with PharmD and providers to reduce risk of fracture associated with osteoporosis Current regimen:  Vitamin D gummies - 2000 IU daily Interventions: Discussed ways to increase calcium intake through diet   Patient self care activities - Over the next 180 days, patient will: Continue 2000 units of vitamin D through supplementation  Pain - arm and back Pharmacist Clinical Goal(s) Over the next 90 days, patient will work with PharmD and providers to complete improve back and neck pain Current regimen:  acetaminophen 325mg  - take 1 or 2 tablets every 8 hours as needed for pain Methocarbamol 500mg  - up to 3 times a day as needed (not currently taking) Interventions: Recommended taking methocarbamol 500mg  up to 3 times a day if needed Patient self care activities - Over the next 90 days, patient will: Take acetaminophen twice daily for pain Continue methocarbamol 500mg  - at bedtime; may use up to 3 times a day if needed for muscle spasms.    Health Maintenance:  Reviewed vaccination history and discussed benefits of COVID booster and Shingrix vaccinations Patient consider COVID booster Patient plans to get Shingrix in 2023  Medication management Pharmacist Clinical Goal(s): Over the next 90 days, patient will work with PharmD and providers to maintain optimal medication adherence Current pharmacy: [facility] Interventions Comprehensive medication review performed. Continue current medication management strategy Patient self care activities - Over the next 90 days, patient will: Focus  on medication adherence by filling and taking medications appropriately  Take medications as prescribed Report any questions or  concerns to PharmD and/or provider(s)   Calcium Content in Foods Calcium is the most abundant mineral in the body. Most of the body's calcium supply is stored in bones and teeth. Calcium helps many parts of the body function normally, including: Blood and blood vessels. Nerves. Hormones. Muscles. Bones and teeth. When your calcium stores are low, you may be at risk for low bone mass, bone loss, and broken bones (fractures). When you get enough calcium, it helps to support strong bones and teeth throughout your life. Calcium is especially important for: Children during growth spurts. Girls during adolescence. Women who are pregnant or breastfeeding. Women after their menstrual cycle stops (postmenopause). Women whose menstrual cycle has stopped due to anorexia nervosa or regular intense exercise. People who cannot eat or digest dairy products. Vegans. Recommended daily amounts of calcium: Women (ages 29 to 60): 1,000 mg per day. Women (ages 29 and older): 1,200 mg per day. Men (ages 82 to 46): 1,000 mg per day. Men (ages 57 and older): 1,200 mg per day. Women (ages 81 to 49): 1,300 mg per day. Men (ages 32 to 24): 1,300 mg per day. General information Eat foods that are high in calcium. Try to get most of your calcium from food. Some people may benefit from taking calcium supplements. Check with your health care provider or diet and nutrition specialist (dietitian) before starting any calcium supplements. Calcium supplements may interact with certain medicines. Too much calcium may cause other health problems, such as constipation and kidney stones. For the body to absorb calcium, it needs vitamin D. Sources of vitamin D include: Skin exposure to direct sunlight. Foods, such as egg yolks, liver, mushrooms, saltwater fish, and fortified milk. Vitamin D supplements. Check with your health care provider or dietitian before starting any vitamin D supplements. What foods are high in  calcium? Foods that are high in calcium contain more than 100 milligrams per serving. Fruits Fortified orange juice or other fruit juice, 300 mg per 8 oz serving. Vegetables Collard greens, 360 mg per 8 oz serving. Kale, 100 mg per 8 oz serving. Bok choy, 160 mg per 8 oz serving. Grains Fortified ready-to-eat cereals, 100 to 1,000 mg per 8 oz serving. Fortified frozen waffles, 200 mg in 2 waffles. Oatmeal, 140 mg in 1 cup. Meats and other proteins Sardines, canned with bones, 325 mg per 3 oz serving. Salmon, canned with bones, 180 mg per 3 oz serving. Canned shrimp, 125 mg per 3 oz serving. Baked beans, 160 mg per 4 oz serving. Tofu, firm, made with calcium sulfate, 253 mg per 4 oz serving. Dairy Yogurt, plain, low-fat, 310 mg per 6 oz serving. Nonfat milk, 300 mg per 8 oz serving. American cheese, 195 mg per 1 oz serving. Cheddar cheese, 205 mg per 1 oz serving. Cottage cheese 2%, 105 mg per 4 oz serving. Fortified soy, rice, or almond milk, 300 mg per 8 oz serving. Mozzarella, part skim, 210 mg per 1 oz serving. The items listed above may not be a complete list of foods high in calcium. Actual amounts of calcium may be different depending on processing. Contact a dietitian for more information. What foods are lower in calcium? Foods that are lower in calcium contain 50 mg or less per serving. Fruits Apple, about 6 mg. Banana, about 12 mg. Vegetables Lettuce, 19 mg per 2 oz serving. Tomato, about 11 mg. Grains  Rice, 4 mg per 6 oz serving. Boiled potatoes, 14 mg per 8 oz serving. White bread, 6 mg per slice. Meats and other proteins Egg, 27 mg per 2 oz serving. Red meat, 7 mg per 4 oz serving. Chicken, 17 mg per 4 oz serving. Fish, cod, or trout, 20 mg per 4 oz serving. Dairy Cream cheese, regular, 14 mg per 1 Tbsp serving. Brie cheese, 50 mg per 1 oz serving. Parmesan cheese, 70 mg per 1 Tbsp serving. The items listed above may not be a complete list of foods lower in  calcium. Actual amounts of calcium may be different depending on processing. Contact a dietitian for more information. Summary Calcium is an important mineral in the body because it affects many functions. Getting enough calcium helps support strong bones and teeth throughout your life. Try to get most of your calcium from food. Calcium supplements may interact with certain medicines. Check with your health care provider or dietitian before starting any calcium supplements. This information is not intended to replace advice given to you by your health care provider. Make sure you discuss any questions you have with your health care provider. Document Revised: 06/16/2019 Document Reviewed: 06/16/2019 Elsevier Patient Education  2022 [place].    Patient verbalizes understanding of instructions provided today and agrees to view in [place].   Telephone follow up appointment with care management team member scheduled for: 3 to 4 months  Cherre Robins, PharmD Clinical Pharmacist [place] [facility]

## 2021-01-17 ENCOUNTER — Ambulatory Visit (HOSPITAL_BASED_OUTPATIENT_CLINIC_OR_DEPARTMENT_OTHER): Payer: Medicare Other | Admitting: Physical Therapy

## 2021-01-17 ENCOUNTER — Encounter (HOSPITAL_BASED_OUTPATIENT_CLINIC_OR_DEPARTMENT_OTHER): Payer: Self-pay | Admitting: Physical Therapy

## 2021-01-17 ENCOUNTER — Other Ambulatory Visit: Payer: Self-pay

## 2021-01-17 DIAGNOSIS — G8929 Other chronic pain: Secondary | ICD-10-CM

## 2021-01-17 DIAGNOSIS — R293 Abnormal posture: Secondary | ICD-10-CM

## 2021-01-17 DIAGNOSIS — R2689 Other abnormalities of gait and mobility: Secondary | ICD-10-CM

## 2021-01-17 DIAGNOSIS — R262 Difficulty in walking, not elsewhere classified: Secondary | ICD-10-CM

## 2021-01-17 DIAGNOSIS — M6281 Muscle weakness (generalized): Secondary | ICD-10-CM

## 2021-01-17 DIAGNOSIS — R2681 Unsteadiness on feet: Secondary | ICD-10-CM
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
## 2021-01-19 DIAGNOSIS — H35033 Hypertensive retinopathy, bilateral: Secondary | ICD-10-CM | POA: Diagnosis not present

## 2021-01-19 DIAGNOSIS — H40013 Open angle with borderline findings, low risk, bilateral: Secondary | ICD-10-CM | POA: Diagnosis not present

## 2021-01-19 DIAGNOSIS — H538 Other visual disturbances: Secondary | ICD-10-CM | POA: Diagnosis not present

## 2021-01-19 DIAGNOSIS — H353132 Nonexudative age-related macular degeneration, bilateral, intermediate dry stage: Secondary | ICD-10-CM | POA: Diagnosis not present

## 2021-01-21 ENCOUNTER — Other Ambulatory Visit: Payer: Self-pay

## 2021-01-21 ENCOUNTER — Emergency Department (HOSPITAL_BASED_OUTPATIENT_CLINIC_OR_DEPARTMENT_OTHER): Payer: Medicare Other

## 2021-01-21 ENCOUNTER — Encounter (HOSPITAL_BASED_OUTPATIENT_CLINIC_OR_DEPARTMENT_OTHER): Payer: Self-pay | Admitting: *Deleted

## 2021-01-21 ENCOUNTER — Emergency Department (HOSPITAL_BASED_OUTPATIENT_CLINIC_OR_DEPARTMENT_OTHER)
Admission: EM | Admit: 2021-01-21 | Discharge: 2021-01-21 | Disposition: A | Discharge status: Home / Self Care | Payer: Medicare Other | Attending: Emergency Medicine | Admitting: Emergency Medicine

## 2021-01-21 DIAGNOSIS — E039 Hypothyroidism, unspecified: Secondary | ICD-10-CM | POA: Diagnosis not present

## 2021-01-21 DIAGNOSIS — Z7982 Long term (current) use of aspirin: Secondary | ICD-10-CM | POA: Insufficient documentation

## 2021-01-21 DIAGNOSIS — Z20822 Contact with and (suspected) exposure to covid-19: Secondary | ICD-10-CM | POA: Insufficient documentation

## 2021-01-21 DIAGNOSIS — J189 Pneumonia, unspecified organism: Secondary | ICD-10-CM | POA: Diagnosis not present

## 2021-01-21 DIAGNOSIS — D72829 Elevated white blood cell count, unspecified: Secondary | ICD-10-CM | POA: Insufficient documentation

## 2021-01-21 DIAGNOSIS — I10 Essential (primary) hypertension: Secondary | ICD-10-CM | POA: Insufficient documentation

## 2021-01-21 DIAGNOSIS — R0602 Shortness of breath: Secondary | ICD-10-CM | POA: Insufficient documentation

## 2021-01-21 DIAGNOSIS — R059 Cough, unspecified: Secondary | ICD-10-CM | POA: Diagnosis not present

## 2021-01-21 DIAGNOSIS — Z8616 Personal history of COVID-19: Secondary | ICD-10-CM | POA: Diagnosis not present

## 2021-01-21 DIAGNOSIS — Z79899 Other long term (current) drug therapy: Secondary | ICD-10-CM | POA: Diagnosis not present

## 2021-01-21 DIAGNOSIS — I700 Atherosclerosis of aorta: Secondary | ICD-10-CM | POA: Diagnosis not present

## 2021-01-21 DIAGNOSIS — R531 Weakness: Secondary | ICD-10-CM | POA: Insufficient documentation

## 2021-01-21 LAB — CBC WITH DIFFERENTIAL/PLATELET
Abs Immature Granulocytes: 0.09 10*3/uL — ABNORMAL HIGH (ref 0.00–0.07)
Basophils Absolute: 0.1 10*3/uL (ref 0.0–0.1)
Basophils Relative: 1 %
Eosinophils Absolute: 0.3 10*3/uL (ref 0.0–0.5)
Eosinophils Relative: 2 %
HCT: 37.8 % (ref 36.0–46.0)
Hemoglobin: 12.7 g/dL (ref 12.0–15.0)
Immature Granulocytes: 1 %
Lymphocytes Relative: 10 %
Lymphs Abs: 1.6 10*3/uL (ref 0.7–4.0)
MCH: 27.7 pg (ref 26.0–34.0)
MCHC: 33.6 g/dL (ref 30.0–36.0)
MCV: 82.4 fL (ref 80.0–100.0)
Monocytes Absolute: 1.6 10*3/uL — ABNORMAL HIGH (ref 0.1–1.0)
Monocytes Relative: 10 %
Neutro Abs: 13.1 10*3/uL — ABNORMAL HIGH (ref 1.7–7.7)
Neutrophils Relative %: 76 %
Platelets: 253 10*3/uL (ref 150–400)
RBC: 4.59 MIL/uL (ref 3.87–5.11)
RDW: 14.8 % (ref 11.5–15.5)
WBC: 16.8 10*3/uL — ABNORMAL HIGH (ref 4.0–10.5)
nRBC: 0 % (ref 0.0–0.2)

## 2021-01-21 LAB — BASIC METABOLIC PANEL
Anion gap: 11 (ref 5–15)
BUN: 14 mg/dL (ref 8–23)
CO2: 25 mmol/L (ref 22–32)
Calcium: 8.6 mg/dL — ABNORMAL LOW (ref 8.9–10.3)
Chloride: 102 mmol/L (ref 98–111)
Creatinine, Ser: 0.91 mg/dL (ref 0.44–1.00)
GFR, Estimated: >60 mL/min (ref >60)
Glucose, Bld: 103 mg/dL — ABNORMAL HIGH (ref 70–99)
Potassium: 3.3 mmol/L — ABNORMAL LOW (ref 3.5–5.1)
Sodium: 138 mmol/L (ref 135–145)

## 2021-01-21 LAB — TROPONIN I (HIGH SENSITIVITY): Troponin I (High Sensitivity): 7 ng/L (ref <18)

## 2021-01-21 LAB — RESP PANEL BY RT-PCR (FLU A&B, COVID) ARPGX2
Influenza A by PCR: NEGATIVE
Influenza B by PCR: NEGATIVE
SARS Coronavirus 2 by RT PCR: NEGATIVE

## 2021-01-21 LAB — BRAIN NATRIURETIC PEPTIDE: B Natriuretic Peptide: 196.3 pg/mL — ABNORMAL HIGH (ref 0.0–100.0)

## 2021-01-21 MED ORDER — AMOXICILLIN-POT CLAVULANATE 875-125 MG PO TABS: 1.0000 | ORAL_TABLET | Freq: Two times a day (BID) | ORAL | 0 refills | Status: DC

## 2021-01-21 MED ORDER — ALBUTEROL SULFATE HFA 108 (90 BASE) MCG/ACT IN AERS: 2.0000 | INHALATION_SPRAY | RESPIRATORY_TRACT | Status: DC | PRN

## 2021-01-21 NOTE — ED Triage Notes (Signed)
Pt reports dx with bronchitis in the last few weeks and treated with steroids and antibiotics. States cough has not improved and is productive for green sputum

## 2021-01-21 NOTE — ED Provider Notes (Signed)
[facility] EMERGENCY DEPARTMENT Provider Note  CSN: [id number] Arrival date & time: 01/21/21 1311    History Chief Complaint  Patient presents with   Cough    Amanda [name] is a 80 y.o. female with history of multiple medical problems including non-hodkin's lymphoma on [place] had a respiratory illness about 6 weeks ago, saw her PCP, give Rx for doxycycline and steroids and improved. She felt well for a few days and then began to have cough productive of green sputum, SOB with exertion and general fatigue about 2 weeks ago. No reported fevers but she had chills last night. Seems to be worse in the last 2 days.    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[place])    Osteopenia    Pneumonia    Pneumonia due to COVID-19 virus 2021   Required hospitalization   PONV (postoperative nausea and vomiting)    needs little anesthesia   Shingles    Shortness of breath    on exertion   Spleen enlarged    SUI (stress urinary incontinence, female)    Urinary, incontinence, stress female    Wears glasses     Past Surgical History:  Procedure Laterality Date   BREAST EXCISIONAL BIOPSY Left [place]   CATARACT EXTRACTION  2009, 2011   BOTH EYES   CATARACT EXTRACTION, BILATERAL     CESAREAN SECTION  1959   CESAREAN SECTION     COLONOSCOPY      Dr Cristina Gong   DILATION AND CURETTAGE OF UTERUS     X2   HYSTEROSCOPY WITH D & C  01/07/2012    Procedure: DILATATION AND CURETTAGE /HYSTEROSCOPY;  Surgeon: Terrance Mass, MD;  Location: [facility];  Service: Gynecology;  Laterality: N/A;  intrauterine foley catheter for tamponode    IR IMAGING GUIDED PORT INSERTION  07/15/2018   LYMPH NODE BIOPSY Left 05/26/2018   Procedure: LEFT AXILLARY LYMPH NODE BIOPSY;  Surgeon: Fanny Skates, MD;  Location: [facility];  Service: General;  Laterality: Left;   ORIF ANKLE FRACTURE Left 12/12/2018   ORIF ANKLE FRACTURE Left 12/12/2018   Procedure: OPEN REDUCTION INTERNAL FIXATION (ORIF) ANKLE FRACTURE;  Surgeon: Meredith Pel, MD;  Location: [facility];  Service: Orthopedics;  Laterality: Left;   ORIF ANKLE FRACTURE Left 12/2018   TONSILLECTOMY     TONSILLECTOMY AND ADENOIDECTOMY     TUBAL LIGATION     BY LAPAROSCOPY   WISDOM TOOTH EXTRACTION      Family History  Problem Relation Age of Onset   Ovarian cancer Mother    Breast cancer Mother 76   Hypertension Father    Prostate cancer Father    Kidney failure Father    Diabetes Father    Hyperlipidemia Brother    COPD Paternal Grandfather  Stroke Maternal Grandfather    Hypercalcemia Neg Hx     Social History   Tobacco Use   Smoking status: Never   Smokeless tobacco: Never  Vaping Use   Vaping Use: Never used  Substance Use Topics   Alcohol use: Yes    Comment: RARE   Drug use: Never     Home Medications Prior to Admission medications   Medication Sig Start Date End Date Taking? Authorizing Provider  amoxicillin-clavulanate (AUGMENTIN) 875-125 MG tablet Take 1 tablet by mouth every 12 (twelve) hours. 01/21/21  Yes Truddie Hidden, MD  acetaminophen (TYLENOL) 325 MG tablet Take 1-2 tablets (325-650 mg total) by mouth every 8 (eight) hours as needed for mild pain. 04/19/20   Love, Ivan Anchors, PA-C  amLODipine (NORVASC) 10 MG tablet Take 1 tablet (10 mg total) by mouth daily. 01/04/21   [name], DO  aspirin 81 MG chewable tablet Chew 1 tablet (81 mg total) by mouth  daily. 12/25/18   Angiulli, Lavon Paganini, PA-C  buPROPion (WELLBUTRIN XL) 150 MG 24 hr tablet TAKE 1 TABLET(150 MG) BY MOUTH DAILY 11/15/20   Carollee Herter, Alferd Apa, DO  Cholecalciferol (VITAMIN D3 ADULT GUMMIES) 25 MCG (1000 UT) CHEW Chew 2 each by mouth daily.    [provider]  levothyroxine (SYNTHROID) 125 MCG tablet TAKE 1 TABLET(125 MCG) BY MOUTH DAILY BEFORE AND BREAKFAST 10/23/20   Carollee Herter, Alferd Apa, DO  lidocaine-prilocaine (EMLA) cream Apply 1 application topically as needed.    [provider]  methocarbamol (ROBAXIN) 500 MG tablet TAKE 1-2 TABLETS BY MOUTH THREE TIMES DAILY. AFTER A FEW DAYS DECREASE TO TWICE DAILY THEN AGAIN DECREASE TO ONCE DAILY AS NEEDED 12/19/20   Meredith Staggers, MD  Multiple Vitamins-Minerals [facility] FORMULA) CAPS Take 1 capsule by mouth in the morning and at bedtime.    [provider]  pantoprazole (PROTONIX) 40 MG tablet TAKE 1 TABLET(40 MG) BY MOUTH DAILY 12/29/20   Carollee Herter, Yvonne R, DO  senna-docusate (SENOKOT-S) 8.6-50 MG tablet Take 2 tablets by mouth at bedtime. 04/19/20   Love, Ivan Anchors, PA-C     Allergies    Phenergan [promethazine hcl], Preservision areds 2 [multiple vitamins-minerals], Levaquin [levofloxacin], and Pravastatin   Review of Systems   Review of Systems A comprehensive review of systems was completed and negative except as noted in HPI.    Physical Exam BP (!) 154/70   Pulse 66   Temp 98.2 F (36.8 C) (Oral)   Resp (!) 23   Ht 5' (1.524 m)   Wt 67 kg   SpO2 94%   BMI 28.85 kg/m   Physical Exam Vitals and nursing note reviewed.  Constitutional:      Appearance: Normal appearance.  HENT:     Head: Normocephalic and atraumatic.     Nose: Nose normal.     Mouth/Throat:     Mouth: Mucous membranes are moist.  Eyes:     Extraocular Movements: Extraocular movements intact.     Conjunctiva/sclera: Conjunctivae normal.  Cardiovascular:     Rate and Rhythm: Normal rate.  Pulmonary:      Effort: Pulmonary effort is normal.     Breath sounds: Wheezing and rhonchi present. No rales.  Abdominal:     General: Abdomen is flat.     Palpations: Abdomen is soft.     Tenderness: There is no abdominal tenderness.  Musculoskeletal:        General: No swelling. Normal range of  motion.     Cervical back: Neck supple.     Right lower leg: No edema.     Left lower leg: No edema.  Skin:    General: Skin is warm and dry.  Neurological:     General: No focal deficit present.     Mental Status: She is alert.  Psychiatric:        Mood and Affect: Mood normal.     ED Results / Procedures / Treatments   Labs (all labs ordered are listed, but only abnormal results are displayed) Labs Reviewed  BASIC METABOLIC PANEL - Abnormal; Notable for the following components:      Result Value   Potassium 3.3 (*)    Glucose, Bld 103 (*)    Calcium 8.6 (*)    All other components within normal limits  BRAIN NATRIURETIC PEPTIDE - Abnormal; Notable for the following components:   B Natriuretic Peptide 196.3 (*)    All other components within normal limits  CBC WITH DIFFERENTIAL/PLATELET - Abnormal; Notable for the following components:   WBC 16.8 (*)    Neutro Abs 13.1 (*)    Monocytes Absolute 1.6 (*)    Abs Immature Granulocytes 0.09 (*)    All other components within normal limits  RESP PANEL BY RT-PCR (FLU A&B, COVID) ARPGX2  TROPONIN I (HIGH SENSITIVITY)    EKG EKG Interpretation  Date/Time:  Sunday January 21 2021 16:41:05 EST Ventricular Rate:  73 PR Interval:  171 QRS Duration: 92 QT Interval:  391 QTC Calculation: 431 R Axis:   15 Text Interpretation: Sinus rhythm Consider anterior infarct No significant change since last tracing Confirmed by Calvert Cantor [phone]) on 01/21/2021 4:51:44 PM   Radiology DG Chest 2 View  Result Date: 01/21/2021 CLINICAL DATA:  Persistent cough for 3 weeks EXAM: CHEST - 2 VIEW COMPARISON:  December 14, 2020 FINDINGS: No  pneumothorax. Stable Port-A-Cath. Vascular crowding in the medial right lung base, stable since October 2022. No convincing evidence of infiltrate/pneumonia. No other acute abnormalities. IMPRESSION: No active cardiopulmonary disease. Electronically Signed   By: Dorise Bullion III M.D.   On: 01/21/2021 14:39   CT Chest Wo Contrast  Result Date: 01/21/2021 CLINICAL DATA:  Respiratory illness. Bronchitis over the past few weeks treated with steroids and antibiotics. Persistent cough. EXAM: CT CHEST WITHOUT CONTRAST TECHNIQUE: Multidetector CT imaging of the chest was performed following the standard protocol without IV contrast. COMPARISON:  03/31/2020 FINDINGS: Cardiovascular: Right-sided Port-A-Cath is present with tip over the SVC. Heart is normal size. Mild ectasia of the ascending thoracic aorta measuring 3.7 cm unchanged. Minimal calcified plaque over the thoracic aorta. Remaining vascular structures are unremarkable. Mediastinum/Nodes: No significant mediastinal or hilar adenopathy. Remaining mediastinal structures are unremarkable. Lungs/Pleura: Stable biapical pleural thickening. Lungs are otherwise adequately inflated. Subtle patchy opacification over the posteromedial right lower lobe which may be due to atelectasis, infectious or inflammatory process. There is mild associated bronchiectatic change in the posteromedial right base. Mild scarring and bronchiectatic change over the medial right middle lobe unchanged. No pleural effusion. Upper Abdomen: Calcified plaque over the abdominal aorta. No acute findings. Musculoskeletal: Spondylosis of the spine. Stable mild compression deformity of L1 with new mild compression deformity of T12 compared to the prior exam. IMPRESSION: 1. Subtle patchy opacification over the posteromedial right lower lobe which may be due to atelectasis, infection or inflammatory process. Consider follow-up CT 4-6 weeks. Mild associated bronchiectatic change. Stable scarring and  bronchiectatic change over the medial right middle lobe.  2. Stable mild compression deformity of L1 with interval mild compression deformity of T12 compared to January 2022. 3. Aortic atherosclerosis. Mild ectasia of the ascending thoracic aorta measuring 3.7 cm. Recommend annual imaging followup by CTA or MRA. This recommendation follows 2010 ACCF/AHA/AATS/ACR/ASA/SCA/SCAI/SIR/STS/SVM Guidelines for the Diagnosis and Management of Patients with Thoracic Aortic Disease. Circulation.2010; 121: I951-O841. Aortic aneurysm NOS (ICD10-I71.9). Aortic Atherosclerosis (ICD10-I70.0). Electronically Signed   By: Marin Olp M.D.   On: 01/21/2021 16:58    Procedures Procedures  Medications Ordered in the ED Medications  albuterol (VENTOLIN HFA) 108 (90 Base) MCG/ACT inhaler 2 puff (has no administration in time range)     MDM Rules/Calculators/A&P MDM  Patient with persistent URI symptoms. CXR is unremarkable but suspicion for PNA is moderate. Will check labs, CT chest and give albuterol MDI for symptomatic relief.  ED Course  I have reviewed the triage vital signs and the nursing notes.  Pertinent labs & imaging results that were available during my care of the patient were reviewed by me and considered in my medical decision making (see chart for details).  Clinical Course as of 01/21/21 1818  Sun Jan 21, 2021  1656 CBC with moderate leukocytosis.  [CS]  6606 BMP is unremarkable.  [CS]  3016 Initial Trop and BNP are unremarkable.  [CS]  0109 Covid/Flu are negative. CT with RLL pneumonia, will treat with Augmentin. She has the MDI she can use as needed. PCP Follow up and RTED if getting worse.  [CS]    Clinical Course User Index [CS] Truddie Hidden, MD    Final Clinical Impression(s) / ED Diagnoses Final diagnoses:  Community acquired pneumonia of right lower lobe of lung    Rx / DC Orders ED Discharge Orders          Ordered    amoxicillin-clavulanate (AUGMENTIN) 875-125 MG  tablet  Every 12 hours        01/21/21 1818             Truddie Hidden, MD 01/21/21 1818

## 2021-01-21 NOTE — ED Notes (Signed)
Patient transported to CT 

## 2021-01-23 ENCOUNTER — Other Ambulatory Visit: Payer: Self-pay | Admitting: Family Medicine

## 2021-01-23 ENCOUNTER — Ambulatory Visit (INDEPENDENT_AMBULATORY_CARE_PROVIDER_SITE_OTHER): Payer: Medicare Other | Admitting: Family Medicine

## 2021-01-23 ENCOUNTER — Other Ambulatory Visit: Payer: Self-pay

## 2021-01-23 VITALS — BP 145/82 | HR 96 | Temp 98.5°F | Resp 12 | Ht 60.0 in | Wt 150.0 lb

## 2021-01-23 DIAGNOSIS — J189 Pneumonia, unspecified organism: Secondary | ICD-10-CM

## 2021-01-23 MED ORDER — PREDNISONE 10 MG PO TABS: ORAL_TABLET | ORAL | 0 refills | Status: DC

## 2021-01-23 MED ORDER — ALBUTEROL SULFATE HFA 108 (90 BASE) MCG/ACT IN AERS
2.0000 | INHALATION_SPRAY | Freq: Four times a day (QID) | RESPIRATORY_TRACT | 0 refills | Status: DC | PRN
Start: 2021-01-23 — End: 2021-01-24

## 2021-01-23 MED ORDER — METHYLPREDNISOLONE ACETATE 80 MG/ML IJ SUSP
80.0000 mg | Freq: Once | INTRAMUSCULAR | Status: AC
Start: 2021-01-23 — End: 2021-01-23
  Administered 2021-01-23: 80 mg via INTRAMUSCULAR

## 2021-01-23 MED ORDER — AZITHROMYCIN 250 MG PO TABS: ORAL_TABLET | ORAL | 0 refills | Status: DC

## 2021-01-23 MED ORDER — BENZONATATE 100 MG PO CAPS: 200.0000 mg | ORAL_CAPSULE | Freq: Three times a day (TID) | ORAL | 1 refills | Status: DC | PRN

## 2021-01-23 MED ORDER — ALBUTEROL SULFATE (2.5 MG/3ML) 0.083% IN NEBU: 2.5000 mg | INHALATION_SOLUTION | Freq: Four times a day (QID) | RESPIRATORY_TRACT | 12 refills | Status: DC | PRN

## 2021-01-23 NOTE — Progress Notes (Signed)
Subjective:   By signing my name below, I, [name], attest that this documentation has been prepared under the direction and in the presence of Dr. Roma Schanz, DO. 01/23/2021    Patient ID: Amanda [name], female    DOB: May 14, 1940, 80 y.o.   MRN: [id number]  Chief Complaint  Patient presents with   ER visit- pneumonia    HPI Patient is in today for a ED follow up.  She was admitted to the ED on 01/21/2021 for community acquired pneumonia of the right lower lobe of the lung.   Cough- She continues having frequent coughs. She is also wheezing. Her cough worsens while laying down on her right side. She feels tightness in her chest at this time. She is requesting an inhaler to manage her symptoms. She was not given medication to manage her cough from the ED due to being on tessalon Perles at that time. She is requesting a refill on cough medication as well.  Her husband reports giving her a nebulizer treatment at home to manage her wheezing and cough and found mild relief.  Water therapy- She completed water therapy for her arm strength. She can now lift her arms higher and has more general range of motion with her upper extremities. She is not working on building strength in for her back during therapy.   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[place])    Osteopenia    Pneumonia    Pneumonia due to COVID-19 virus 2021   Required hospitalization   PONV (postoperative  nausea and vomiting)    needs little anesthesia   Shingles    Shortness of breath    on exertion   Spleen enlarged    SUI (stress urinary incontinence, female)    Urinary, incontinence, stress female    Wears glasses     Past Surgical History:  Procedure Laterality Date   BREAST EXCISIONAL BIOPSY Left [place]   CATARACT EXTRACTION  2009, 2011   BOTH EYES   CATARACT EXTRACTION, BILATERAL     CESAREAN SECTION  1959   CESAREAN SECTION     COLONOSCOPY      Dr Cristina Gong   DILATION AND CURETTAGE OF UTERUS     X2   HYSTEROSCOPY WITH D & C  01/07/2012   Procedure: DILATATION AND CURETTAGE /HYSTEROSCOPY;  Surgeon: Terrance Mass, MD;  Location: [facility];  Service: Gynecology;  Laterality: N/A;  intrauterine foley catheter for tamponode    IR IMAGING GUIDED PORT INSERTION  07/15/2018   LYMPH NODE BIOPSY Left 05/26/2018   Procedure: LEFT AXILLARY LYMPH NODE BIOPSY;  Surgeon: Fanny Skates, MD;  Location: [facility];  Service: General;  Laterality: Left;   ORIF ANKLE FRACTURE Left 12/12/2018   ORIF ANKLE FRACTURE Left 12/12/2018   Procedure: OPEN REDUCTION  INTERNAL FIXATION (ORIF) ANKLE FRACTURE;  Surgeon: Meredith Pel, MD;  Location: [facility];  Service: Orthopedics;  Laterality: Left;   ORIF ANKLE FRACTURE Left 12/2018   TONSILLECTOMY     TONSILLECTOMY AND ADENOIDECTOMY     TUBAL LIGATION     BY LAPAROSCOPY   WISDOM TOOTH EXTRACTION      Family History  Problem Relation Age of Onset   Ovarian cancer Mother    Breast cancer Mother 67   Hypertension Father    Prostate cancer Father    Kidney failure Father    Diabetes Father    Hyperlipidemia Brother    COPD Paternal Grandfather    Stroke Maternal Grandfather    Hypercalcemia Neg Hx     Social History   Socioeconomic History   Marital status: Married    Spouse name: Not on file   Number of children: Not on file   Years of education: Not on file   Highest education level: Not on file   Occupational History   Not on file  Tobacco Use   Smoking status: Never   Smokeless tobacco: Never  Vaping Use   Vaping Use: Never used  Substance and Sexual Activity   Alcohol use: Yes    Comment: RARE   Drug use: Never   Sexual activity: Never    Birth control/protection: Post-menopausal  Other Topics Concern   Not on file  Social History Narrative   ** Merged History Encounter **       ** Merged History Encounter **       Social Determinants of Health   Financial Resource Strain: Low Risk    Difficulty of Paying Living Expenses: Not hard at all  Food Insecurity: No Food Insecurity   Worried About Charity fundraiser in the Last Year: Never true   Ran Out of Food in the Last Year: Never true  Transportation Needs: No Transportation Needs   Lack of Transportation (Medical): No   Lack of Transportation (Non-Medical): No  Physical Activity: Insufficiently Active   Days of Exercise per Week: 3 days   Minutes of Exercise per Session: 30 min  Stress: Not on file  Social Connections: Not on file  Intimate Partner Violence: Not on file    Outpatient Medications Prior to Visit  Medication Sig Dispense Refill   acetaminophen (TYLENOL) 325 MG tablet Take 1-2 tablets (325-650 mg total) by mouth every 8 (eight) hours as needed for mild pain.     amLODipine (NORVASC) 10 MG tablet Take 1 tablet (10 mg total) by mouth daily. 90 tablet 1   amoxicillin-clavulanate (AUGMENTIN) 875-125 MG tablet Take 1 tablet by mouth every 12 (twelve) hours. 14 tablet 0   aspirin 81 MG chewable tablet Chew 1 tablet (81 mg total) by mouth daily.     buPROPion (WELLBUTRIN XL) 150 MG 24 hr tablet TAKE 1 TABLET(150 MG) BY MOUTH DAILY 90 tablet 1   Cholecalciferol (VITAMIN D3 ADULT GUMMIES) 25 MCG (1000 UT) CHEW Chew 2 each by mouth daily.     levothyroxine (SYNTHROID) 125 MCG tablet TAKE 1 TABLET(125 MCG) BY MOUTH DAILY BEFORE AND BREAKFAST 90 tablet 1   lidocaine-prilocaine (EMLA) cream Apply 1  application topically as needed.     Multiple Vitamins-Minerals (MACULAR HEALTH FORMULA) CAPS Take 1 capsule by mouth in the morning and at bedtime.     pantoprazole (PROTONIX) 40 MG tablet TAKE 1 TABLET(40 MG) BY MOUTH DAILY 90 tablet 1   senna-docusate (SENOKOT-S) 8.6-50 MG tablet  Take 2 tablets by mouth at bedtime. 60 tablet 0   methocarbamol (ROBAXIN) 500 MG tablet TAKE 1-2 TABLETS BY MOUTH THREE TIMES DAILY. AFTER A FEW DAYS DECREASE TO TWICE DAILY THEN AGAIN DECREASE TO ONCE DAILY AS NEEDED 60 tablet 1   Facility-Administered Medications Prior to Visit  Medication Dose Route Frequency Provider Last Rate Last Admin   diphenhydrAMINE (BENADRYL) capsule 50 mg  50 mg Oral Once Tish Men, MD       heparin lock flush 100 unit/mL  500 Units Intracatheter Once PRN Tish Men, MD       sodium chloride flush (NS) 0.9 % injection 10 mL  10 mL Intracatheter PRN Tish Men, MD        Allergies  Allergen Reactions   Phenergan [Promethazine Hcl] Other (See Comments)    Jerking/agitation   Preservision Areds 2 [Multiple Vitamins-Minerals] Nausea Only   Levaquin [Levofloxacin] Nausea And Vomiting   Pravastatin Nausea And Vomiting    Review of Systems  Constitutional:  Negative for chills, fever and malaise/fatigue.  HENT:  Negative for congestion and hearing loss.   Eyes:  Negative for discharge.  Respiratory:  Positive for cough and wheezing. Negative for sputum production and shortness of breath.        (+)chest tightness  Cardiovascular:  Negative for chest pain, palpitations and leg swelling.  Gastrointestinal:  Negative for abdominal pain, blood in stool, constipation, diarrhea, heartburn, nausea and vomiting.  Genitourinary:  Negative for dysuria, frequency, hematuria and urgency.  Musculoskeletal:  Negative for back pain, falls and myalgias.  Skin:  Negative for rash.  Neurological:  Negative for dizziness, sensory change, loss of consciousness, weakness and headaches.   Endo/Heme/Allergies:  Negative for environmental allergies. Does not bruise/bleed easily.  Psychiatric/Behavioral:  Negative for depression and suicidal ideas. The patient is not nervous/anxious and does not have insomnia.       Objective:    Physical Exam Vitals and nursing note reviewed.  Constitutional:      General: She is not in acute distress.    Appearance: Normal appearance. She is not ill-appearing.  HENT:     Head: Normocephalic and atraumatic.     Right Ear: External ear normal.     Left Ear: External ear normal.  Eyes:     Extraocular Movements: Extraocular movements intact.     Pupils: Pupils are equal, round, and reactive to light.  Cardiovascular:     Rate and Rhythm: Normal rate and regular rhythm.     Heart sounds: Normal heart sounds. No murmur heard.   No gallop.  Pulmonary:     Effort: Pulmonary effort is normal.     Breath sounds: Wheezing present.  Skin:    General: Skin is warm and dry.  Neurological:     Mental Status: She is alert and oriented to person, place, and time.  Psychiatric:        Behavior: Behavior normal.        Judgment: Judgment normal.    BP (!) 145/82 (BP Location: Right Arm, Cuff Size: Normal)   Pulse 96   Temp 98.5 F (36.9 C) (Oral)   Resp 12   Ht 5' (1.524 m)   Wt 150 lb (68 kg)   SpO2 94%   BMI 29.29 kg/m  Wt Readings from Last 3 Encounters:  01/23/21 150 lb (68 kg)  01/21/21 147 lb 11.3 oz (67 kg)  01/04/21 147 lb 12.8 oz (67 kg)    Diabetic Foot Exam - Simple  No data filed    Lab Results  Component Value Date   WBC 16.8 (H) 01/21/2021   HGB 12.7 01/21/2021   HCT 37.8 01/21/2021   PLT 253 01/21/2021   GLUCOSE 103 (H) 01/21/2021   CHOL 222 (H) 01/04/2021   TRIG 146.0 01/04/2021   HDL 68.30 01/04/2021   LDLDIRECT 123.0 04/27/2020   LDLCALC 125 (H) 01/04/2021   ALT 15 01/04/2021   AST 16 01/04/2021   NA 138 01/21/2021   K 3.3 (L) 01/21/2021   CL 102 01/21/2021   CREATININE 0.91 01/21/2021   BUN 14  01/21/2021   CO2 25 01/21/2021   TSH 12.79 (H) 04/27/2020   INR 1.0 04/01/2020   HGBA1C 6.1 (H) 11/18/2014    Lab Results  Component Value Date   TSH 12.79 (H) 04/27/2020   Lab Results  Component Value Date   WBC 16.8 (H) 01/21/2021   HGB 12.7 01/21/2021   HCT 37.8 01/21/2021   MCV 82.4 01/21/2021   PLT 253 01/21/2021   Lab Results  Component Value Date   NA 138 01/21/2021   K 3.3 (L) 01/21/2021   CO2 25 01/21/2021   GLUCOSE 103 (H) 01/21/2021   BUN 14 01/21/2021   CREATININE 0.91 01/21/2021   BILITOT 0.4 01/04/2021   ALKPHOS 79 01/04/2021   AST 16 01/04/2021   ALT 15 01/04/2021   PROT 6.1 01/04/2021   ALBUMIN 4.2 01/04/2021   CALCIUM 8.6 (L) 01/21/2021   ANIONGAP 11 01/21/2021   GFR 52.64 (L) 01/04/2021   Lab Results  Component Value Date   CHOL 222 (H) 01/04/2021   Lab Results  Component Value Date   HDL 68.30 01/04/2021   Lab Results  Component Value Date   LDLCALC 125 (H) 01/04/2021   Lab Results  Component Value Date   TRIG 146.0 01/04/2021   Lab Results  Component Value Date   CHOLHDL 3 01/04/2021   Lab Results  Component Value Date   HGBA1C 6.1 (H) 11/18/2014       Assessment & Plan:   Problem List Items Addressed This Visit   None Visit Diagnoses     Community acquired pneumonia of right middle lobe of lung    -  Primary   Relevant Medications   benzonatate (TESSALON PERLES) 100 MG capsule   albuterol (PROVENTIL) (2.5 MG/3ML) 0.083% nebulizer solution   predniSONE (DELTASONE) 10 MG tablet   azithromycin (ZITHROMAX Z-PAK) 250 MG tablet   methylPREDNISolone acetate (DEPO-MEDROL) injection 80 mg (Completed)        Meds ordered this encounter  Medications   benzonatate (TESSALON PERLES) 100 MG capsule    Sig: Take 2 capsules (200 mg total) by mouth 3 (three) times daily as needed for cough.    Dispense:  30 capsule    Refill:  1   DISCONTD: albuterol (VENTOLIN HFA) 108 (90 Base) MCG/ACT inhaler    Sig: Inhale 2 puffs into the  lungs every 6 (six) hours as needed for wheezing or shortness of breath.    Dispense:  8 g    Refill:  0   albuterol (PROVENTIL) (2.5 MG/3ML) 0.083% nebulizer solution    Sig: Take 3 mLs (2.5 mg total) by nebulization every 6 (six) hours as needed for wheezing or shortness of breath.    Dispense:  75 mL    Refill:  12   predniSONE (DELTASONE) 10 MG tablet    Sig: TAKE 3 TABLETS PO QD FOR 3 DAYS THEN TAKE 2 TABLETS PO QD  FOR 3 DAYS THEN TAKE 1 TABLET PO QD FOR 3 DAYS THEN TAKE 1/2 TAB PO QD FOR 3 DAYS    Dispense:  20 tablet    Refill:  0   azithromycin (ZITHROMAX Z-PAK) 250 MG tablet    Sig: As directed    Dispense:  6 each    Refill:  0   methylPREDNISolone acetate (DEPO-MEDROL) injection 80 mg    I, Dr. Roma Schanz, DO, personally preformed the services described in this documentation.  All medical record entries made by the scribe were at my direction and in my presence.  I have reviewed the chart and discharge instructions (if applicable) and agree that the record reflects my personal performance and is accurate and complete. 01/23/2021   I,[name],acting as a scribe for [name], DO.,have documented all relevant documentation on the behalf of [name], DO,as directed by  [name], DO while in the presence of [name], DO.   [name], DO

## 2021-01-23 NOTE — Patient Instructions (Signed)
Community-Acquired Pneumonia, Adult ?Pneumonia is a lung infection that causes inflammation and the buildup of mucus and fluids in the lungs. This may cause coughing and difficulty breathing. Community-acquired pneumonia is pneumonia that develops in people who are not, and have not recently been, in a hospital or other health care facility. ?Usually, pneumonia develops as a result of an illness that is caused by a virus, such as the common cold and the flu (influenza). It can also be caused by bacteria or fungi. While the common cold and influenza can pass from person to person (are contagious), pneumonia itself is not considered contagious. ?What are the causes? ?This condition may be caused by: ?Viruses. ?Bacteria. ?Fungi, such as molds or mushrooms. ?What increases the risk? ?The following factors may make you more likely to develop this condition: ?Having certain medical conditions, such as: ?A long-term (chronic) disease, which may include chronic obstructive pulmonary disease (COPD), asthma, heart failure, cystic fibrosis, diabetes, kidney disease, sickle cell disease, and human immunodeficiency virus (HIV). ?A condition that increases the risk of breathing in (aspirating) mucus and other fluids from your mouth and nose. ?A weakened body defense system (immune system). ?Having had your spleen removed (splenectomy). The spleen is the organ that helps fight germs and infections. ?Not cleaning your teeth and gums well (poor dental hygiene). ?Using tobacco products. ?Traveling to places where germs that cause pneumonia are present. ?Being near certain animals, or animal habitats, that have germs that cause pneumonia. ?Being older than 82 years of age. ?What are the signs or symptoms? ?Symptoms of this condition include: ?A dry cough or a wet (productive) cough. ?A fever. ?Sweating or chills. ?Chest pain, especially when breathing deeply or coughing. ?Fast breathing, difficulty breathing, or shortness of  breath. ?Tiredness (fatigue). ?Muscle aches. ?How is this diagnosed? ?This condition may be diagnosed based on your medical history or a physical exam. You may also have tests, including: ?Chest X-rays. ?Tests of the level of oxygen and other gases in your blood. ?Tests of: ?Your blood. ?Mucus from your lungs (sputum). ?Fluid around your lungs (pleural fluid). ?Your urine. ?If your pneumonia is severe, other tests may be done to learn more about the cause. ?How is this treated? ?Treatment for this condition depends on many factors, such as the cause of your pneumonia, your medicines, and other medical conditions that you have. ?For most adults, pneumonia may be treated at home. In some cases, treatment must happen in a hospital and may include: ?Medicines that are given by mouth (orally) or through an IV, including: ?Antibiotic medicines, if bacteria caused the pneumonia. ?Medicines that kill viruses (antiviral medicines), if a virus caused the pneumonia. ?Oxygen therapy. ?Severe pneumonia, although rare, may require the following treatments: ?Mechanical ventilation.This procedure uses a machine to help you breathe if you cannot breathe well on your own or maintain a safe level of blood oxygen. ?Thoracentesis. This procedure removes any buildup of pleural fluid to help with breathing. ?Follow these instructions at home: ?Medicines ?Take over-the-counter and prescription medicines only as told by your health care provider. ?Take cough medicine only if you have trouble sleeping. Cough medicine can prevent your body from removing mucus from your lungs. ?If you were prescribed an antibiotic medicine, take it as told by your health care provider. Do not stop taking the antibiotic even if you start to feel better. ?Lifestyle ?  ?Do not drink alcohol. ?Do not use any products that contain nicotine or tobacco, such as cigarettes, e-cigarettes, and chewing tobacco. If you   need help quitting, ask your health care  provider. ?Eat a healthy diet. This includes plenty of vegetables, fruits, whole grains, low-fat dairy products, and lean protein. ?General instructions ?Rest a lot and get at least 8 hours of sleep each night. ?Sleep in a partly upright position at night. Place a few pillows under your head or sleep in a reclining chair. ?Return to your normal activities as told by your health care provider. Ask your health care provider what activities are safe for you. ?Drink enough fluid to keep your urine pale yellow. This helps to thin the mucus in your lungs. ?If your throat is sore, gargle with a salt-water mixture 3-4 times a day or as needed. To make a salt-water mixture, completely dissolve ?-1 tsp (3-6 g) of salt in 1 cup (237 mL) of warm water. ?Keep all follow-up visits as told by your health care provider. This is important. ?How is this prevented? ?You can lower your risk of developing community-acquired pneumonia by: ?Getting the pneumonia vaccine. There are different types and schedules of pneumonia vaccines. Ask your health care provider which option is best for you. Consider getting the pneumonia vaccine if: ?You are older than 82 years of age. ?You are 19-65 years of age and are receiving cancer treatment, have chronic lung disease, or have other medical conditions that affect your immune system. Ask your health care provider if this applies to you. ?Getting your influenza vaccine every year. Ask your health care provider which type of vaccine is best for you. ?Getting regular dental checkups. ?Washing your hands often with soap and water for at least 20 seconds. If soap and water are not available, use hand sanitizer. ?Contact a health care provider if you have: ?A fever. ?Trouble sleeping because you cannot control your cough with cough medicine. ?Get help right away if: ?Your shortness of breath becomes worse. ?Your chest pain increases. ?Your sickness becomes worse, especially if you are an older adult or  have a weak immune system. ?You cough up blood. ?These symptoms may represent a serious problem that is an emergency. Do not wait to see if the symptoms will go away. Get medical help right away. Call your local emergency services (911 in the [place] Do not drive yourself to the hospital. ?Summary ?Pneumonia is an infection of the lungs. ?Community-acquired pneumonia develops in people who have not been in the hospital. It can be caused by bacteria, viruses, or fungi. ?This condition may be treated with antibiotics or antiviral medicines. ?Severe pneumonia may require a hospital stay and treatment to help with breathing. ?This information is not intended to replace advice given to you by your health care provider. Make sure you discuss any questions you have with your health care provider. ?Document Revised: 12/01/2018 Document Reviewed: 12/01/2018 ?Elsevier Patient Education ? 2022 [employer] ? ?

## 2021-01-24 ENCOUNTER — Ambulatory Visit (HOSPITAL_BASED_OUTPATIENT_CLINIC_OR_DEPARTMENT_OTHER): Payer: 59 | Admitting: Physical Therapy

## 2021-01-29 ENCOUNTER — Encounter: Payer: Self-pay | Admitting: Family Medicine

## 2021-01-29 ENCOUNTER — Other Ambulatory Visit: Payer: Self-pay | Admitting: Family Medicine

## 2021-01-29 DIAGNOSIS — R059 Cough, unspecified: Secondary | ICD-10-CM

## 2021-01-29 MED ORDER — PROMETHAZINE-DM 6.25-15 MG/5ML PO SYRP: 5.0000 mL | ORAL_SOLUTION | Freq: Four times a day (QID) | ORAL | 0 refills | Status: DC | PRN

## 2021-01-30 ENCOUNTER — Encounter: Payer: Self-pay | Admitting: Family Medicine

## 2021-01-30 NOTE — Assessment & Plan Note (Signed)
Fu 2 weeks

## 2021-01-31 DIAGNOSIS — I10 Essential (primary) hypertension: Secondary | ICD-10-CM | POA: Diagnosis not present

## 2021-01-31 DIAGNOSIS — E785 Hyperlipidemia, unspecified: Secondary | ICD-10-CM | POA: Diagnosis not present

## 2021-01-31 DIAGNOSIS — M810 Age-related osteoporosis without current pathological fracture: Secondary | ICD-10-CM

## 2021-02-06 ENCOUNTER — Ambulatory Visit: Payer: 59 | Admitting: Family Medicine

## 2021-02-06 ENCOUNTER — Ambulatory Visit (HOSPITAL_BASED_OUTPATIENT_CLINIC_OR_DEPARTMENT_OTHER): Payer: 59 | Admitting: Physical Therapy

## 2021-02-07 ENCOUNTER — Ambulatory Visit (HOSPITAL_BASED_OUTPATIENT_CLINIC_OR_DEPARTMENT_OTHER)
Admission: RE | Admit: 2021-02-07 | Discharge: 2021-02-07 | Disposition: A | Discharge status: Home / Self Care | Payer: Medicare Other | Source: Ambulatory Visit | Attending: Family Medicine | Admitting: Family Medicine

## 2021-02-07 ENCOUNTER — Other Ambulatory Visit: Payer: Self-pay

## 2021-02-07 DIAGNOSIS — R0602 Shortness of breath: Secondary | ICD-10-CM | POA: Diagnosis not present

## 2021-02-07 LAB — ECHOCARDIOGRAM COMPLETE
AR max vel: 2.57 cm2
AV Area VTI: 2.57 cm2
AV Area mean vel: 2.59 cm2
AV Mean grad: 4 mmHg
AV Peak grad: 7.5 mmHg
Ao pk vel: 1.37 m/s
Area-P 1/2: 3.54 cm2
Calc EF: 66.7 %
S' Lateral: 2.6 cm
Single Plane A2C EF: 57.4 %
Single Plane A4C EF: 71.9 %

## 2021-02-08 ENCOUNTER — Other Ambulatory Visit: Payer: Self-pay | Admitting: Family Medicine

## 2021-02-08 DIAGNOSIS — R059 Cough, unspecified: Secondary | ICD-10-CM

## 2021-02-09 ENCOUNTER — Encounter: Payer: Self-pay | Admitting: Family Medicine

## 2021-02-09 ENCOUNTER — Other Ambulatory Visit: Payer: Self-pay

## 2021-02-09 ENCOUNTER — Ambulatory Visit (INDEPENDENT_AMBULATORY_CARE_PROVIDER_SITE_OTHER): Payer: Medicare Other | Admitting: Family Medicine

## 2021-02-09 ENCOUNTER — Ambulatory Visit (HOSPITAL_BASED_OUTPATIENT_CLINIC_OR_DEPARTMENT_OTHER)
Admission: RE | Admit: 2021-02-09 | Discharge: 2021-02-09 | Disposition: A | Discharge status: Home / Self Care | Payer: Medicare Other | Source: Ambulatory Visit | Attending: Family Medicine | Admitting: Family Medicine

## 2021-02-09 VITALS — BP 140/80 | HR 82 | Temp 98.4°F | Ht 60.0 in | Wt 147.2 lb

## 2021-02-09 DIAGNOSIS — R6889 Other general symptoms and signs: Secondary | ICD-10-CM | POA: Diagnosis not present

## 2021-02-09 DIAGNOSIS — R5383 Other fatigue: Secondary | ICD-10-CM

## 2021-02-09 DIAGNOSIS — J189 Pneumonia, unspecified organism: Secondary | ICD-10-CM | POA: Diagnosis not present

## 2021-02-09 DIAGNOSIS — Z8701 Personal history of pneumonia (recurrent): Secondary | ICD-10-CM

## 2021-02-09 DIAGNOSIS — R0602 Shortness of breath: Secondary | ICD-10-CM | POA: Insufficient documentation

## 2021-02-09 DIAGNOSIS — R059 Cough, unspecified: Secondary | ICD-10-CM

## 2021-02-09 MED ORDER — FLUTICASONE PROPIONATE HFA 110 MCG/ACT IN AERO: 2.0000 | INHALATION_SPRAY | Freq: Two times a day (BID) | RESPIRATORY_TRACT | 12 refills | Status: DC

## 2021-02-09 MED ORDER — PROMETHAZINE-DM 6.25-15 MG/5ML PO SYRP
5.0000 mL | ORAL_SOLUTION | Freq: Four times a day (QID) | ORAL | 0 refills | Status: DC | PRN
Start: 2021-02-09 — End: 2021-04-19

## 2021-02-09 NOTE — Patient Instructions (Signed)
Community-Acquired Pneumonia, Adult ?Pneumonia is a lung infection that causes inflammation and the buildup of mucus and fluids in the lungs. This may cause coughing and difficulty breathing. Community-acquired pneumonia is pneumonia that develops in people who are not, and have not recently been, in a hospital or other health care facility. ?Usually, pneumonia develops as a result of an illness that is caused by a virus, such as the common cold and the flu (influenza). It can also be caused by bacteria or fungi. While the common cold and influenza can pass from person to person (are contagious), pneumonia itself is not considered contagious. ?What are the causes? ?This condition may be caused by: ?Viruses. ?Bacteria. ?Fungi, such as molds or mushrooms. ?What increases the risk? ?The following factors may make you more likely to develop this condition: ?Having certain medical conditions, such as: ?A long-term (chronic) disease, which may include chronic obstructive pulmonary disease (COPD), asthma, heart failure, cystic fibrosis, diabetes, kidney disease, sickle cell disease, and human immunodeficiency virus (HIV). ?A condition that increases the risk of breathing in (aspirating) mucus and other fluids from your mouth and nose. ?A weakened body defense system (immune system). ?Having had your spleen removed (splenectomy). The spleen is the organ that helps fight germs and infections. ?Not cleaning your teeth and gums well (poor dental hygiene). ?Using tobacco products. ?Traveling to places where germs that cause pneumonia are present. ?Being near certain animals, or animal habitats, that have germs that cause pneumonia. ?Being older than 82 years of age. ?What are the signs or symptoms? ?Symptoms of this condition include: ?A dry cough or a wet (productive) cough. ?A fever. ?Sweating or chills. ?Chest pain, especially when breathing deeply or coughing. ?Fast breathing, difficulty breathing, or shortness of  breath. ?Tiredness (fatigue). ?Muscle aches. ?How is this diagnosed? ?This condition may be diagnosed based on your medical history or a physical exam. You may also have tests, including: ?Chest X-rays. ?Tests of the level of oxygen and other gases in your blood. ?Tests of: ?Your blood. ?Mucus from your lungs (sputum). ?Fluid around your lungs (pleural fluid). ?Your urine. ?If your pneumonia is severe, other tests may be done to learn more about the cause. ?How is this treated? ?Treatment for this condition depends on many factors, such as the cause of your pneumonia, your medicines, and other medical conditions that you have. ?For most adults, pneumonia may be treated at home. In some cases, treatment must happen in a hospital and may include: ?Medicines that are given by mouth (orally) or through an IV, including: ?Antibiotic medicines, if bacteria caused the pneumonia. ?Medicines that kill viruses (antiviral medicines), if a virus caused the pneumonia. ?Oxygen therapy. ?Severe pneumonia, although rare, may require the following treatments: ?Mechanical ventilation.This procedure uses a machine to help you breathe if you cannot breathe well on your own or maintain a safe level of blood oxygen. ?Thoracentesis. This procedure removes any buildup of pleural fluid to help with breathing. ?Follow these instructions at home: ?Medicines ?Take over-the-counter and prescription medicines only as told by your health care provider. ?Take cough medicine only if you have trouble sleeping. Cough medicine can prevent your body from removing mucus from your lungs. ?If you were prescribed an antibiotic medicine, take it as told by your health care provider. Do not stop taking the antibiotic even if you start to feel better. ?Lifestyle ?  ?Do not drink alcohol. ?Do not use any products that contain nicotine or tobacco, such as cigarettes, e-cigarettes, and chewing tobacco. If you   need help quitting, ask your health care  provider. ?Eat a healthy diet. This includes plenty of vegetables, fruits, whole grains, low-fat dairy products, and lean protein. ?General instructions ?Rest a lot and get at least 8 hours of sleep each night. ?Sleep in a partly upright position at night. Place a few pillows under your head or sleep in a reclining chair. ?Return to your normal activities as told by your health care provider. Ask your health care provider what activities are safe for you. ?Drink enough fluid to keep your urine pale yellow. This helps to thin the mucus in your lungs. ?If your throat is sore, gargle with a salt-water mixture 3-4 times a day or as needed. To make a salt-water mixture, completely dissolve ?-1 tsp (3-6 g) of salt in 1 cup (237 mL) of warm water. ?Keep all follow-up visits as told by your health care provider. This is important. ?How is this prevented? ?You can lower your risk of developing community-acquired pneumonia by: ?Getting the pneumonia vaccine. There are different types and schedules of pneumonia vaccines. Ask your health care provider which option is best for you. Consider getting the pneumonia vaccine if: ?You are older than 82 years of age. ?You are 19-65 years of age and are receiving cancer treatment, have chronic lung disease, or have other medical conditions that affect your immune system. Ask your health care provider if this applies to you. ?Getting your influenza vaccine every year. Ask your health care provider which type of vaccine is best for you. ?Getting regular dental checkups. ?Washing your hands often with soap and water for at least 20 seconds. If soap and water are not available, use hand sanitizer. ?Contact a health care provider if you have: ?A fever. ?Trouble sleeping because you cannot control your cough with cough medicine. ?Get help right away if: ?Your shortness of breath becomes worse. ?Your chest pain increases. ?Your sickness becomes worse, especially if you are an older adult or  have a weak immune system. ?You cough up blood. ?These symptoms may represent a serious problem that is an emergency. Do not wait to see if the symptoms will go away. Get medical help right away. Call your local emergency services (911 in the [place] Do not drive yourself to the hospital. ?Summary ?Pneumonia is an infection of the lungs. ?Community-acquired pneumonia develops in people who have not been in the hospital. It can be caused by bacteria, viruses, or fungi. ?This condition may be treated with antibiotics or antiviral medicines. ?Severe pneumonia may require a hospital stay and treatment to help with breathing. ?This information is not intended to replace advice given to you by your health care provider. Make sure you discuss any questions you have with your health care provider. ?Document Revised: 12/01/2018 Document Reviewed: 12/01/2018 ?Elsevier Patient Education ? 2022 [employer] ? ?

## 2021-02-09 NOTE — Assessment & Plan Note (Signed)
Refer to pulmonary Add flovent inh to albuterol  Recheck cxr

## 2021-02-09 NOTE — Progress Notes (Signed)
Established Patient Office Visit  Subjective:  Patient ID: Amanda [name], female    DOB: [birth year]  Age: 80 y.o. MRN: [id number]  CC:  Chief Complaint  Patient presents with   Pneumonia    2 week f/u     HPI Amanda [name] presents for f/u pneumonia.  She is still not feeling well .   The coughing is better but she gets sob easily.  She states her pulse ox drops to 88% on exertion .      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[place])    Osteopenia    Pneumonia    Pneumonia due to COVID-19 virus 2021   Required hospitalization   PONV (postoperative nausea and vomiting)    needs little anesthesia   Shingles    Shortness of breath    on exertion   Spleen enlarged    SUI (stress urinary incontinence, female)    Urinary, incontinence, stress female    Wears glasses     Past Surgical History:  Procedure Laterality Date   BREAST EXCISIONAL BIOPSY Left [place]   CATARACT EXTRACTION  2009, 2011   BOTH EYES   CATARACT EXTRACTION, BILATERAL     CESAREAN SECTION  1959   CESAREAN SECTION     COLONOSCOPY      Dr Cristina Gong   DILATION AND CURETTAGE OF UTERUS     X2   HYSTEROSCOPY WITH D & C  01/07/2012   Procedure: DILATATION AND CURETTAGE /HYSTEROSCOPY;  Surgeon: Terrance Mass, MD;  Location: [facility];  Service: Gynecology;  Laterality: N/A;  intrauterine foley catheter for tamponode    IR IMAGING GUIDED PORT INSERTION  07/15/2018   LYMPH NODE  BIOPSY Left 05/26/2018   Procedure: LEFT AXILLARY LYMPH NODE BIOPSY;  Surgeon: Fanny Skates, MD;  Location: [facility];  Service: General;  Laterality: Left;   ORIF ANKLE FRACTURE Left 12/12/2018   ORIF ANKLE FRACTURE Left 12/12/2018   Procedure: OPEN REDUCTION INTERNAL FIXATION (ORIF) ANKLE FRACTURE;  Surgeon: Meredith Pel, MD;  Location: [facility];  Service: Orthopedics;  Laterality: Left;   ORIF ANKLE FRACTURE Left 12/2018   TONSILLECTOMY     TONSILLECTOMY AND ADENOIDECTOMY     TUBAL LIGATION     BY LAPAROSCOPY   WISDOM TOOTH EXTRACTION      Family History  Problem Relation Age of Onset   Ovarian cancer Mother    Breast cancer Mother 22   Hypertension Father    Prostate cancer Father    Kidney failure Father    Diabetes Father    Hyperlipidemia Brother    COPD Paternal Grandfather    Stroke Maternal Grandfather    Hypercalcemia Neg Hx     Social History   Socioeconomic History   Marital status: Married    Spouse  name: Not on file   Number of children: Not on file   Years of education: Not on file   Highest education level: Not on file  Occupational History   Not on file  Tobacco Use   Smoking status: Never   Smokeless tobacco: Never  Vaping Use   Vaping Use: Never used  Substance and Sexual Activity   Alcohol use: Yes    Comment: RARE   Drug use: Never   Sexual activity: Never    Birth control/protection: Post-menopausal  Other Topics Concern   Not on file  Social History Narrative   ** Merged History Encounter **       ** Merged History Encounter **       Social Determinants of Health   Financial Resource Strain: Low Risk    Difficulty of Paying Living Expenses: Not hard at all  Food Insecurity: No Food Insecurity   Worried About Charity fundraiser in the Last Year: Never true   [place] in the Last Year: Never true  Transportation Needs: No Transportation Needs   Lack of Transportation (Medical): No   Lack of Transportation (Non-Medical):  No  Physical Activity: Insufficiently Active   Days of Exercise per Week: 3 days   Minutes of Exercise per Session: 30 min  Stress: Not on file  Social Connections: Not on file  Intimate Partner Violence: Not on file    Outpatient Medications Prior to Visit  Medication Sig Dispense Refill   acetaminophen (TYLENOL) 325 MG tablet Take 1-2 tablets (325-650 mg total) by mouth every 8 (eight) hours as needed for mild pain.     albuterol (VENTOLIN HFA) 108 (90 Base) MCG/ACT inhaler INHALE 2 PUFFS INTO THE LUNGS EVERY 6 HOURS AS NEEDED FOR WHEEZING OR SHORTNESS OF BREATH 54 g 0   amLODipine (NORVASC) 10 MG tablet Take 1 tablet (10 mg total) by mouth daily. 90 tablet 1   aspirin 81 MG chewable tablet Chew 1 tablet (81 mg total) by mouth daily.     benzonatate (TESSALON PERLES) 100 MG capsule Take 2 capsules (200 mg total) by mouth 3 (three) times daily as needed for cough. 30 capsule 1   buPROPion (WELLBUTRIN XL) 150 MG 24 hr tablet TAKE 1 TABLET(150 MG) BY MOUTH DAILY 90 tablet 1   Cholecalciferol (VITAMIN D3 ADULT GUMMIES) 25 MCG (1000 UT) CHEW Chew 2 each by mouth daily.     levothyroxine (SYNTHROID) 125 MCG tablet TAKE 1 TABLET(125 MCG) BY MOUTH DAILY BEFORE AND BREAKFAST 90 tablet 1   lidocaine-prilocaine (EMLA) cream Apply 1 application topically as needed.     Multiple Vitamins-Minerals (MACULAR HEALTH FORMULA) CAPS Take 1 capsule by mouth in the morning and at bedtime.     pantoprazole (PROTONIX) 40 MG tablet TAKE 1 TABLET(40 MG) BY MOUTH DAILY 90 tablet 1   senna-docusate (SENOKOT-S) 8.6-50 MG tablet Take 2 tablets by mouth at bedtime. 60 tablet 0   albuterol (PROVENTIL) (2.5 MG/3ML) 0.083% nebulizer solution Take 3 mLs (2.5 mg total) by nebulization every 6 (six) hours as needed for wheezing or shortness of breath. (Patient not taking: Reported on 02/09/2021) 75 mL 12   amoxicillin-clavulanate (AUGMENTIN) 875-125 MG tablet Take 1 tablet by mouth every 12 (twelve) hours. 14 tablet 0    azithromycin (ZITHROMAX Z-PAK) 250 MG tablet As directed 6 each 0   predniSONE (DELTASONE) 10 MG tablet TAKE 3 TABLETS PO QD FOR 3 DAYS THEN TAKE 2 TABLETS PO QD FOR 3 DAYS THEN  TAKE 1 TABLET PO QD FOR 3 DAYS THEN TAKE 1/2 TAB PO QD FOR 3 DAYS 20 tablet 0   promethazine-dextromethorphan (PROMETHAZINE-DM) 6.25-15 MG/5ML syrup Take 5 mLs by mouth 4 (four) times daily as needed. 118 mL 0   Facility-Administered Medications Prior to Visit  Medication Dose Route Frequency Provider Last Rate Last Admin   diphenhydrAMINE (BENADRYL) capsule 50 mg  50 mg Oral Once Tish Men, MD       heparin lock flush 100 unit/mL  500 Units Intracatheter Once PRN Tish Men, MD       sodium chloride flush (NS) 0.9 % injection 10 mL  10 mL Intracatheter PRN Tish Men, MD        Allergies  Allergen Reactions   Phenergan [Promethazine Hcl] Other (See Comments)    Jerking/agitation   Preservision Areds 2 [Multiple Vitamins-Minerals] Nausea Only   Levaquin [Levofloxacin] Nausea And Vomiting   Pravastatin Nausea And Vomiting    ROS Review of Systems  Constitutional:  Positive for fatigue. Negative for appetite change, diaphoresis and unexpected weight change.  Eyes:  Negative for pain, redness and visual disturbance.  Respiratory:  Positive for cough and shortness of breath. Negative for chest tightness and wheezing.   Cardiovascular:  Negative for chest pain, palpitations and leg swelling.  Endocrine: Negative for cold intolerance, heat intolerance, polydipsia, polyphagia and polyuria.  Genitourinary:  Negative for difficulty urinating, dysuria and frequency.  Neurological:  Negative for dizziness, light-headedness, numbness and headaches.     Objective:    Physical Exam Vitals and nursing note reviewed.  Constitutional:      Appearance: She is well-developed.  HENT:     Head: Normocephalic and atraumatic.  Eyes:     Conjunctiva/sclera: Conjunctivae normal.  Neck:     Thyroid: No thyromegaly.      Vascular: No carotid bruit or JVD.  Cardiovascular:     Rate and Rhythm: Normal rate and regular rhythm.     Heart sounds: Normal heart sounds. No murmur heard. Pulmonary:     Effort: Pulmonary effort is normal. No respiratory distress.     Breath sounds: Wheezing present. No rales.  Chest:     Chest wall: No tenderness.  Musculoskeletal:     Cervical back: Normal range of motion and neck supple.  Neurological:     Mental Status: She is alert and oriented to person, place, and time.    BP 140/80   Pulse 82   Temp 98.4 F (36.9 C) (Oral)   Ht 5' (1.524 m)   Wt 147 lb 3.2 oz (66.8 kg)   SpO2 96%   BMI 28.75 kg/m  Wt Readings from Last 3 Encounters:  02/09/21 147 lb 3.2 oz (66.8 kg)  01/23/21 150 lb (68 kg)  01/21/21 147 lb 11.3 oz (67 kg)     Health Maintenance Due  Topic Date Due   Zoster Vaccines- Shingrix (1 of 2) Never done   COVID-19 Vaccine (4 - Booster for Pfizer series) 05/05/2020   OPHTHALMOLOGY EXAM  12/23/2020    There are no preventive care reminders to display for this patient.  Lab Results  Component Value Date   TSH 12.79 (H) 04/27/2020   Lab Results  Component Value Date   WBC 16.8 (H) 01/21/2021   HGB 12.7 01/21/2021   HCT 37.8 01/21/2021   MCV 82.4 01/21/2021   PLT 253 01/21/2021   Lab Results  Component Value Date   NA 138 01/21/2021   K 3.3 (L) 01/21/2021  CO2 25 01/21/2021   GLUCOSE 103 (H) 01/21/2021   BUN 14 01/21/2021   CREATININE 0.91 01/21/2021   BILITOT 0.4 01/04/2021   ALKPHOS 79 01/04/2021   AST 16 01/04/2021   ALT 15 01/04/2021   PROT 6.1 01/04/2021   ALBUMIN 4.2 01/04/2021   CALCIUM 8.6 (L) 01/21/2021   ANIONGAP 11 01/21/2021   GFR 52.64 (L) 01/04/2021   Lab Results  Component Value Date   CHOL 222 (H) 01/04/2021   Lab Results  Component Value Date   HDL 68.30 01/04/2021   Lab Results  Component Value Date   LDLCALC 125 (H) 01/04/2021   Lab Results  Component Value Date   TRIG 146.0 01/04/2021   Lab  Results  Component Value Date   CHOLHDL 3 01/04/2021   Lab Results  Component Value Date   HGBA1C 6.1 (H) 11/18/2014      Assessment & Plan:   Problem List Items Addressed This Visit       Unprioritized   Fatigue - Primary   Relevant Orders   CBC with Differential/Platelet   Comprehensive metabolic panel   TSH   Vitamin B12   Cough   Relevant Medications   promethazine-dextromethorphan (PROMETHAZINE-DM) 6.25-15 MG/5ML syrup   fluticasone (FLOVENT HFA) 110 MCG/ACT inhaler   Other Relevant Orders   Ambulatory referral to Pulmonology   CBC with Differential/Platelet   Comprehensive metabolic panel   TSH   Vitamin B12   Community acquired pneumonia of right middle lobe of lung    Refer to pulmonary Add flovent inh to albuterol  Recheck cxr        Relevant Medications   promethazine-dextromethorphan (PROMETHAZINE-DM) 6.25-15 MG/5ML syrup   fluticasone (FLOVENT HFA) 110 MCG/ACT inhaler   Other Visit Diagnoses     History of pneumonia       Relevant Medications   fluticasone (FLOVENT HFA) 110 MCG/ACT inhaler   Other Relevant Orders   Ambulatory referral to Pulmonology   CBC with Differential/Platelet   Comprehensive metabolic panel   TSH   Vitamin B12   Other general symptoms and signs       Relevant Orders   Vitamin B12       Meds ordered this encounter  Medications   promethazine-dextromethorphan (PROMETHAZINE-DM) 6.25-15 MG/5ML syrup    Sig: Take 5 mLs by mouth 4 (four) times daily as needed.    Dispense:  118 mL    Refill:  0   fluticasone (FLOVENT HFA) 110 MCG/ACT inhaler    Sig: Inhale 2 puffs into the lungs in the morning and at bedtime.    Dispense:  1 each    Refill:  12    Follow-up: Return if symptoms worsen or fail to improve.    [name], DO

## 2021-02-10 ENCOUNTER — Encounter: Payer: Self-pay | Admitting: Hematology & Oncology

## 2021-02-10 LAB — COMPREHENSIVE METABOLIC PANEL
AG Ratio: 1.5 (calc) (ref 1.0–2.5)
ALT: 9 U/L (ref 6–29)
AST: 12 U/L (ref 10–35)
Albumin: 3.8 g/dL (ref 3.6–5.1)
Alkaline phosphatase (APISO): 90 U/L (ref 37–153)
BUN/Creatinine Ratio: 21 (calc) (ref 6–22)
BUN: 23 mg/dL (ref 7–25)
CO2: 26 mmol/L (ref 20–32)
Calcium: 9.9 mg/dL (ref 8.6–10.4)
Chloride: 102 mmol/L (ref 98–110)
Creat: 1.09 mg/dL — ABNORMAL HIGH (ref 0.60–0.95)
Globulin: 2.5 g/dL (calc) (ref 1.9–3.7)
Glucose, Bld: 96 mg/dL (ref 65–99)
Potassium: 4.9 mmol/L (ref 3.5–5.3)
Sodium: 141 mmol/L (ref 135–146)
Total Bilirubin: 0.3 mg/dL (ref 0.2–1.2)
Total Protein: 6.3 g/dL (ref 6.1–8.1)

## 2021-02-10 LAB — CBC WITH DIFFERENTIAL/PLATELET
Absolute Monocytes: 1040 cells/uL — ABNORMAL HIGH (ref 200–950)
Basophils Absolute: 68 cells/uL (ref 0–200)
Basophils Relative: 0.6 %
Eosinophils Absolute: 305 cells/uL (ref 15–500)
Eosinophils Relative: 2.7 %
HCT: 37.2 % (ref 35.0–45.0)
Hemoglobin: 11.8 g/dL (ref 11.7–15.5)
Lymphs Abs: 1514 cells/uL (ref 850–3900)
MCH: 26.3 pg — ABNORMAL LOW (ref 27.0–33.0)
MCHC: 31.7 g/dL — ABNORMAL LOW (ref 32.0–36.0)
MCV: 83 fL (ref 80.0–100.0)
MPV: 9.4 fL (ref 7.5–12.5)
Monocytes Relative: 9.2 %
Neutro Abs: 8373 cells/uL — ABNORMAL HIGH (ref 1500–7800)
Neutrophils Relative %: 74.1 %
Platelets: 493 10*3/uL — ABNORMAL HIGH (ref 140–400)
RBC: 4.48 10*6/uL (ref 3.80–5.10)
RDW: 14.2 % (ref 11.0–15.0)
Total Lymphocyte: 13.4 %
WBC: 11.3 10*3/uL — ABNORMAL HIGH (ref 3.8–10.8)

## 2021-02-10 LAB — TSH: TSH: 1.48 mIU/L (ref 0.40–4.50)

## 2021-02-10 LAB — VITAMIN B12: Vitamin B-12: 549 pg/mL (ref 200–1100)

## 2021-02-11 ENCOUNTER — Encounter: Payer: Self-pay | Admitting: Family Medicine

## 2021-02-11 ENCOUNTER — Other Ambulatory Visit: Payer: Self-pay | Admitting: Family Medicine

## 2021-02-11 DIAGNOSIS — J189 Pneumonia, unspecified organism: Secondary | ICD-10-CM

## 2021-02-11 MED ORDER — AZITHROMYCIN 250 MG PO TABS: ORAL_TABLET | ORAL | 0 refills | Status: DC

## 2021-02-11 MED ORDER — AMOXICILLIN-POT CLAVULANATE 875-125 MG PO TABS: 1.0000 | ORAL_TABLET | Freq: Two times a day (BID) | ORAL | 0 refills | Status: DC

## 2021-02-12 ENCOUNTER — Other Ambulatory Visit: Payer: Self-pay | Admitting: Family Medicine

## 2021-02-12 ENCOUNTER — Encounter: Payer: Self-pay | Admitting: Family Medicine

## 2021-02-12 MED ORDER — ADVAIR HFA 115-21 MCG/ACT IN AERO: 2.0000 | INHALATION_SPRAY | Freq: Two times a day (BID) | RESPIRATORY_TRACT | 12 refills | Status: DC

## 2021-02-13 ENCOUNTER — Encounter: Payer: Self-pay | Admitting: Family Medicine

## 2021-02-13 DIAGNOSIS — R5383 Other fatigue: Secondary | ICD-10-CM

## 2021-02-13 DIAGNOSIS — R0602 Shortness of breath: Secondary | ICD-10-CM

## 2021-02-13 DIAGNOSIS — Z8701 Personal history of pneumonia (recurrent): Secondary | ICD-10-CM

## 2021-02-13 NOTE — Telephone Encounter (Signed)
Yes, referral has been done

## 2021-02-14 ENCOUNTER — Other Ambulatory Visit: Payer: Self-pay

## 2021-02-14 ENCOUNTER — Ambulatory Visit (HOSPITAL_BASED_OUTPATIENT_CLINIC_OR_DEPARTMENT_OTHER): Payer: Medicare Other | Attending: Family Medicine | Admitting: Physical Therapy

## 2021-02-14 ENCOUNTER — Encounter (HOSPITAL_BASED_OUTPATIENT_CLINIC_OR_DEPARTMENT_OTHER): Payer: Self-pay | Admitting: Physical Therapy

## 2021-02-14 DIAGNOSIS — R293 Abnormal posture: Secondary | ICD-10-CM | POA: Diagnosis not present

## 2021-02-14 DIAGNOSIS — M545 Low back pain, unspecified: Secondary | ICD-10-CM | POA: Diagnosis not present

## 2021-02-14 DIAGNOSIS — M6281 Muscle weakness (generalized): Secondary | ICD-10-CM | POA: Diagnosis not present

## 2021-02-14 DIAGNOSIS — M542 Cervicalgia: Secondary | ICD-10-CM | POA: Insufficient documentation
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
## 2021-02-16 ENCOUNTER — Other Ambulatory Visit: Payer: Self-pay

## 2021-02-16 ENCOUNTER — Ambulatory Visit (HOSPITAL_COMMUNITY)
Admission: RE | Admit: 2021-02-16 | Discharge: 2021-02-16 | Disposition: A | Discharge status: Home / Self Care | Payer: Medicare Other | Source: Ambulatory Visit | Attending: Hematology & Oncology | Admitting: Hematology & Oncology

## 2021-02-16 DIAGNOSIS — I2510 Atherosclerotic heart disease of native coronary artery without angina pectoris: Secondary | ICD-10-CM | POA: Diagnosis not present

## 2021-02-16 DIAGNOSIS — C8580 Other specified types of non-Hodgkin lymphoma, unspecified site: Secondary | ICD-10-CM | POA: Insufficient documentation

## 2021-02-16 DIAGNOSIS — I358 Other nonrheumatic aortic valve disorders: Secondary | ICD-10-CM | POA: Diagnosis not present

## 2021-02-16 DIAGNOSIS — J479 Bronchiectasis, uncomplicated: Secondary | ICD-10-CM | POA: Diagnosis not present

## 2021-02-16 DIAGNOSIS — C8590 Non-Hodgkin lymphoma, unspecified, unspecified site: Secondary | ICD-10-CM | POA: Diagnosis not present

## 2021-02-16 LAB — GLUCOSE, CAPILLARY: Glucose-Capillary: 102 mg/dL — ABNORMAL HIGH (ref 70–99)

## 2021-02-16 MED ORDER — FLUDEOXYGLUCOSE F - 18 (FDG) INJECTION: 7.1500 | Freq: Once | INTRAVENOUS | Status: DC | PRN

## 2021-02-19 ENCOUNTER — Encounter: Payer: Self-pay | Admitting: *Deleted

## 2021-02-20 ENCOUNTER — Ambulatory Visit (HOSPITAL_BASED_OUTPATIENT_CLINIC_OR_DEPARTMENT_OTHER): Payer: Medicare Other | Admitting: Physical Therapy

## 2021-02-20 ENCOUNTER — Encounter (HOSPITAL_BASED_OUTPATIENT_CLINIC_OR_DEPARTMENT_OTHER): Payer: Self-pay | Admitting: Physical Therapy

## 2021-02-20 ENCOUNTER — Other Ambulatory Visit: Payer: Self-pay

## 2021-02-20 DIAGNOSIS — M545 Low back pain, unspecified: Secondary | ICD-10-CM

## 2021-02-20 DIAGNOSIS — M542 Cervicalgia: Secondary | ICD-10-CM

## 2021-02-20 DIAGNOSIS — R293 Abnormal posture: Secondary | ICD-10-CM

## 2021-02-20 DIAGNOSIS — M6281 Muscle weakness (generalized): Secondary | ICD-10-CM | POA: Diagnosis not present
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
## 2021-02-22 NOTE — Telephone Encounter (Signed)
Order has been resent

## 2021-03-06 ENCOUNTER — Telehealth: Payer: Self-pay

## 2021-03-06 NOTE — Telephone Encounter (Signed)
Pt archived in [email or web address].  Please advise if patient and/or provider wish to proceed with [facility] therpay.  

## 2021-03-07 DIAGNOSIS — H35033 Hypertensive retinopathy, bilateral: Secondary | ICD-10-CM | POA: Diagnosis not present

## 2021-03-07 DIAGNOSIS — H35371 Puckering of macula, right eye: Secondary | ICD-10-CM | POA: Diagnosis not present

## 2021-03-07 DIAGNOSIS — H538 Other visual disturbances: Secondary | ICD-10-CM | POA: Diagnosis not present

## 2021-03-07 DIAGNOSIS — Z961 Presence of intraocular lens: Secondary | ICD-10-CM | POA: Diagnosis not present

## 2021-03-07 DIAGNOSIS — H40003 Preglaucoma, unspecified, bilateral: Secondary | ICD-10-CM | POA: Diagnosis not present

## 2021-03-07 DIAGNOSIS — H53023 Refractive amblyopia, bilateral: Secondary | ICD-10-CM | POA: Diagnosis not present

## 2021-03-07 DIAGNOSIS — D23111 Other benign neoplasm of skin of right upper eyelid, including canthus: Secondary | ICD-10-CM | POA: Diagnosis not present

## 2021-03-07 DIAGNOSIS — H3530 Unspecified macular degeneration: Secondary | ICD-10-CM | POA: Diagnosis not present

## 2021-03-07 DIAGNOSIS — H524 Presbyopia: Secondary | ICD-10-CM | POA: Diagnosis not present

## 2021-03-07 DIAGNOSIS — H18513 Endothelial corneal dystrophy, bilateral: Secondary | ICD-10-CM | POA: Diagnosis not present

## 2021-03-07 DIAGNOSIS — H18523 Epithelial (juvenile) corneal dystrophy, bilateral: Secondary | ICD-10-CM | POA: Diagnosis not present

## 2021-03-07 DIAGNOSIS — H0102A Squamous blepharitis right eye, upper and lower eyelids: Secondary | ICD-10-CM | POA: Diagnosis not present

## 2021-03-08 DIAGNOSIS — H53413 Scotoma involving central area, bilateral: Secondary | ICD-10-CM | POA: Diagnosis not present

## 2021-03-08 DIAGNOSIS — H442E3 Degenerative myopia with other maculopathy, bilateral eye: Secondary | ICD-10-CM | POA: Diagnosis not present

## 2021-03-12 ENCOUNTER — Other Ambulatory Visit: Payer: Self-pay

## 2021-03-12 ENCOUNTER — Encounter: Payer: Self-pay | Admitting: Pulmonary Disease

## 2021-03-12 ENCOUNTER — Ambulatory Visit (INDEPENDENT_AMBULATORY_CARE_PROVIDER_SITE_OTHER): Payer: Medicare Other | Admitting: Pulmonary Disease

## 2021-03-12 VITALS — BP 130/74 | HR 71 | Temp 97.8°F | Ht 60.0 in | Wt 145.0 lb

## 2021-03-12 DIAGNOSIS — J479 Bronchiectasis, uncomplicated: Secondary | ICD-10-CM | POA: Diagnosis not present

## 2021-03-12 DIAGNOSIS — R0602 Shortness of breath: Secondary | ICD-10-CM | POA: Diagnosis not present

## 2021-03-12 DIAGNOSIS — J189 Pneumonia, unspecified organism: Secondary | ICD-10-CM | POA: Diagnosis not present

## 2021-03-12 NOTE — Patient Instructions (Signed)
We will schedule PFTs for better evaluation of her lung Advised to use Mucinex and flutter valve every time you feel the chest congestion building up  Follow-up in 3 months

## 2021-03-13 ENCOUNTER — Encounter: Payer: Self-pay | Admitting: Pulmonary Disease

## 2021-03-13 NOTE — Progress Notes (Signed)
Amanda [name]    [id number]    07/22/1940  Primary Care Physician:Lowne Cheri Rous Alferd Apa, DO  Referring Physician: Carollee Herter, Alferd Apa, DO [address] [place],  [place] [address]  Chief complaint: Consult for dyspnea  HPI: 81 year old with history of hypertension, lymphoma, right mid lobe bronchiectasis complains of chronic dyspnea on exertion with intermittent mucus production.  She had scan in December 2022 for follow-up of lymphoma which showed right middle lobe consolidation for which she got 2 rounds of antibiotics.  She has history of focal right middle lobe bronchiectasis.  Pets: Dog Occupation: Retired Sports administrator Exposures: No known exposures.  No mold, hot tub, Jacuzzi.  No feather pillows or comforters Smoking history: Never smoker Travel history: Previously lived in [place], [place].  No significant recent travel Relevant family history: No family history of lung disease   Outpatient Encounter Medications as of 03/12/2021  Medication Sig   acetaminophen (TYLENOL) 325 MG tablet Take 1-2 tablets (325-650 mg total) by mouth every 8 (eight) hours as needed for mild pain.   albuterol (VENTOLIN HFA) 108 (90 Base) MCG/ACT inhaler INHALE 2 PUFFS INTO THE LUNGS EVERY 6 HOURS AS NEEDED FOR WHEEZING OR SHORTNESS OF BREATH   amLODipine (NORVASC) 10 MG tablet Take 1 tablet (10 mg total) by mouth daily.   aspirin 81 MG chewable tablet Chew 1 tablet (81 mg total) by mouth daily.   buPROPion (WELLBUTRIN XL) 150 MG 24 hr tablet TAKE 1 TABLET(150 MG) BY MOUTH DAILY   Cholecalciferol (VITAMIN D3 ADULT GUMMIES) 25 MCG (1000 UT) CHEW Chew 2 each by mouth daily.   levothyroxine (SYNTHROID) 125 MCG tablet TAKE 1 TABLET(125 MCG) BY MOUTH DAILY BEFORE AND BREAKFAST   Multiple Vitamins-Minerals (MACULAR HEALTH FORMULA) CAPS Take 1 capsule by mouth in the morning and at bedtime.   pantoprazole (PROTONIX) 40 MG tablet Take 40 mg by mouth daily.   senna-docusate  (SENOKOT-S) 8.6-50 MG tablet Take 2 tablets by mouth at bedtime.   benzonatate (TESSALON PERLES) 100 MG capsule Take 2 capsules (200 mg total) by mouth 3 (three) times daily as needed for cough. (Patient not taking: Reported on 03/12/2021)   fluticasone-salmeterol (ADVAIR HFA) 115-21 MCG/ACT inhaler Inhale 2 puffs into the lungs 2 (two) times daily. (Patient not taking: Reported on 03/12/2021)   lidocaine-prilocaine (EMLA) cream Apply 1 application topically as needed. (Patient not taking: Reported on 03/12/2021)   promethazine-dextromethorphan (PROMETHAZINE-DM) 6.25-15 MG/5ML syrup Take 5 mLs by mouth 4 (four) times daily as needed. (Patient not taking: Reported on 03/12/2021)   [DISCONTINUED] amoxicillin-clavulanate (AUGMENTIN) 875-125 MG tablet Take 1 tablet by mouth 2 (two) times daily.   [DISCONTINUED] azithromycin (ZITHROMAX Z-PAK) 250 MG tablet As directed   [DISCONTINUED] pantoprazole (PROTONIX) 40 MG tablet TAKE 1 TABLET(40 MG) BY MOUTH DAILY   Facility-Administered Encounter Medications as of 03/12/2021  Medication   diphenhydrAMINE (BENADRYL) capsule 50 mg   heparin lock flush 100 unit/mL   sodium chloride flush (NS) 0.9 % injection 10 mL    Allergies as of 03/12/2021 - Review Complete 03/12/2021  Allergen Reaction Noted   Phenergan [promethazine hcl] Other (See Comments) 07/03/2012   Preservision areds 2 [multiple vitamins-minerals] Nausea Only 05/18/2019   Levaquin [levofloxacin] Nausea And Vomiting 03/31/2020   Pravastatin Nausea And Vomiting 08/10/2012    Past Medical History:  Diagnosis Date   Anemia    Arthritis    Bronchiectasis ([place])    Cancer ([place])  Cervicalgia    Constipation, chronic    Essential hypertension    GERD (gastroesophageal reflux disease)    zantac   Heart murmur    History of blood transfusion 1959   [place]   Hyperlipidemia    Hypertension    Hyperthyroidism    Hypothyroid    Hypothyroidism    Lumbar burst fracture ([place])     Lymphoproliferative disorder ([place])    Macular degeneration 2013   Both eyes    Macular degeneration, bilateral    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[place])    Osteopenia    Pneumonia    Pneumonia due to COVID-19 virus 2021   Required hospitalization   PONV (postoperative nausea and vomiting)    needs little anesthesia   Shingles    Shortness of breath    on exertion   Spleen enlarged    SUI (stress urinary incontinence, female)    Urinary, incontinence, stress female    Wears glasses     Past Surgical History:  Procedure Laterality Date   BREAST EXCISIONAL BIOPSY Left [place]   CATARACT EXTRACTION  2009, 2011   BOTH EYES   CATARACT EXTRACTION, BILATERAL     CESAREAN SECTION  1959   CESAREAN SECTION     COLONOSCOPY      Dr Cristina Gong   DILATION AND CURETTAGE OF UTERUS     X2   HYSTEROSCOPY WITH D & C  01/07/2012   Procedure: DILATATION AND CURETTAGE /HYSTEROSCOPY;  Surgeon: Terrance Mass, MD;  Location: [facility];  Service: Gynecology;  Laterality: N/A;  intrauterine foley catheter for tamponode    IR IMAGING GUIDED PORT INSERTION  07/15/2018   LYMPH NODE BIOPSY Left 05/26/2018   Procedure: LEFT AXILLARY LYMPH NODE BIOPSY;  Surgeon: Fanny Skates, MD;  Location: [facility];  Service: General;  Laterality: Left;   ORIF ANKLE FRACTURE Left 12/12/2018   ORIF ANKLE FRACTURE Left 12/12/2018   Procedure: OPEN REDUCTION INTERNAL FIXATION (ORIF) ANKLE FRACTURE;  Surgeon: Meredith Pel, MD;  Location: [facility];  Service: Orthopedics;  Laterality: Left;   ORIF ANKLE FRACTURE Left 12/2018   TONSILLECTOMY     TONSILLECTOMY AND ADENOIDECTOMY     TUBAL LIGATION     BY LAPAROSCOPY   WISDOM TOOTH EXTRACTION      Family History  Problem Relation Age of Onset   Ovarian cancer Mother    Breast cancer Mother 65   Hypertension Father    Prostate cancer Father    Kidney failure Father    Diabetes Father    Hyperlipidemia Brother    COPD Paternal Grandfather    Stroke  Maternal Grandfather    Hypercalcemia Neg Hx     Social History   Socioeconomic History   Marital status: Married    Spouse name: Not on file   Number of children: Not on file   Years of education: Not on file   Highest education level: Not on file  Occupational History   Not on file  Tobacco Use   Smoking status: Never   Smokeless tobacco: Never  Vaping Use   Vaping Use: Never used  Substance and Sexual Activity   Alcohol use: Yes    Comment: RARE   Drug use: Never   Sexual activity: Never    Birth control/protection: Post-menopausal  Other Topics Concern   Not on file  Social History Narrative   ** Merged History Encounter **       **  Merged History Encounter **       Social Determinants of Health   Financial Resource Strain: Low Risk    Difficulty of Paying Living Expenses: Not hard at all  Food Insecurity: No Food Insecurity   Worried About Charity fundraiser in the Last Year: Never true   Arboriculturist in the Last Year: Never true  Transportation Needs: No Transportation Needs   Lack of Transportation (Medical): No   Lack of Transportation (Non-Medical): No  Physical Activity: Insufficiently Active   Days of Exercise per Week: 3 days   Minutes of Exercise per Session: 30 min  Stress: Not on file  Social Connections: Not on file  Intimate Partner Violence: Not on file    Review of systems: Review of Systems  Constitutional: Negative for fever and chills.  HENT: Negative.   Eyes: Negative for blurred vision.  Respiratory: as per HPI  Cardiovascular: Negative for chest pain and palpitations.  Gastrointestinal: Negative for vomiting, diarrhea, blood per rectum. Genitourinary: Negative for dysuria, urgency, frequency and hematuria.  Musculoskeletal: Negative for myalgias, back pain and joint pain.  Skin: Negative for itching and rash.  Neurological: Negative for dizziness, tremors, focal weakness, seizures and loss of consciousness.   Endo/Heme/Allergies: Negative for environmental allergies.  Psychiatric/Behavioral: Negative for depression, suicidal ideas and hallucinations.  All other systems reviewed and are negative.  Physical Exam: Blood pressure 130/74, pulse 71, temperature 97.8 F (36.6 C), temperature source Oral, height 5' (1.524 m), weight 145 lb (65.8 kg), SpO2 98 %. Gen:      No acute distress HEENT:  EOMI, sclera anicteric Neck:     No masses; no thyromegaly Lungs:    Clear to auscultation bilaterally; normal respiratory effort CV:         Regular rate and rhythm; no murmurs Abd:      + bowel sounds; soft, non-tender; no palpable masses, no distension Ext:    No edema; adequate peripheral perfusion Skin:      Warm and dry; no rash Neuro: alert and oriented x 3 Psych: normal mood and affect  Data Reviewed: Imaging: CT chest 01/21/2021-pleural thickening, atelectasis in the lower lobe, mild scarring and bronchiectasis over the right base and medial right middle lobe  PET scan 02/16/2021-volume loss in the right middle lobe with bronchiectasis and groundglass changes.  I have reviewed the images personally.  PFTs:  Labs: WBC 11.3, hemoglobin 11.8, platelets 493 Quantitative immunoglobulins 06/05/2018-IgE less than 2, IgG 521, IgA 57  Assessment:  Dyspnea, right middle lobe low bronchiectasis Bronchiectasis may be post inflammatory from prior pneumonia.  As she has history of recurrent pneumonia this raises suspicion for right middle lobe syndrome.  Currently she is not making enough sputum to test for AFB We will schedule PFTs for better evaluation of the lung Use Mucinex and flutter valve to help with mucociliary clearance.  Plan/Recommendations: PFTs Flutter valve, Mucinex  Marshell Garfinkel MD [facility] 03/13/2021, 4:22 PM  CC: [name], *

## 2021-03-27 ENCOUNTER — Encounter: Payer: Self-pay | Admitting: *Deleted

## 2021-03-27 DIAGNOSIS — Z95828 Presence of other vascular implants and grafts: Secondary | ICD-10-CM

## 2021-03-27 DIAGNOSIS — C8580 Other specified types of non-Hodgkin lymphoma, unspecified site: Secondary | ICD-10-CM

## 2021-03-27 NOTE — Progress Notes (Signed)
Patient calling wanting to know if she can have her port removed. Spoke to Dr Marin Olp and orders placed.   Oncology Nurse Navigator Documentation  Oncology Nurse Navigator Flowsheets 03/27/2021  Abnormal Finding Date -  Confirmed Diagnosis Date -  Diagnosis Status -  Planned Course of Treatment -  Phase of Treatment -  Chemotherapy Actual Start Date: -  Chemotherapy Actual End Date: -  Navigator Follow Up Date: -  Navigator Follow Up Reason: -  Navigation Complete Date: -  Post Navigation: Continue to Follow Patient? -  Reason Not Navigating Patient: -  Financial planner  Referral Date to RadOnc/MedOnc -  Navigator Encounter Type Telephone  Telephone Incoming Call  Treatment Initiated Date -  Patient Visit Type MedOnc  Treatment Phase Active Tx  Barriers/Navigation Needs Coordination of Care  Education -  Interventions Coordination of Care  Acuity Level 1-No Barriers  Referrals -  Coordination of Care Radiology  Education Method -  Support Groups/Services Friends and Family  Time Spent with Patient 15

## 2021-04-03 DIAGNOSIS — H53413 Scotoma involving central area, bilateral: Secondary | ICD-10-CM | POA: Diagnosis not present

## 2021-04-10 ENCOUNTER — Other Ambulatory Visit: Payer: Self-pay | Admitting: Internal Medicine

## 2021-04-11 ENCOUNTER — Other Ambulatory Visit: Payer: Self-pay

## 2021-04-11 ENCOUNTER — Ambulatory Visit (HOSPITAL_COMMUNITY)
Admission: RE | Admit: 2021-04-11 | Discharge: 2021-04-11 | Disposition: A | Discharge status: Home / Self Care | Payer: Medicare Other | Source: Ambulatory Visit | Attending: Hematology & Oncology | Admitting: Hematology & Oncology

## 2021-04-11 ENCOUNTER — Encounter (HOSPITAL_COMMUNITY): Payer: Self-pay

## 2021-04-11 DIAGNOSIS — K219 Gastro-esophageal reflux disease without esophagitis: Secondary | ICD-10-CM | POA: Insufficient documentation

## 2021-04-11 DIAGNOSIS — E785 Hyperlipidemia, unspecified: Secondary | ICD-10-CM | POA: Diagnosis not present

## 2021-04-11 DIAGNOSIS — C8580 Other specified types of non-Hodgkin lymphoma, unspecified site: Secondary | ICD-10-CM

## 2021-04-11 DIAGNOSIS — D649 Anemia, unspecified: Secondary | ICD-10-CM | POA: Insufficient documentation

## 2021-04-11 DIAGNOSIS — Z95828 Presence of other vascular implants and grafts: Secondary | ICD-10-CM

## 2021-04-11 DIAGNOSIS — Z452 Encounter for adjustment and management of vascular access device: Secondary | ICD-10-CM | POA: Insufficient documentation

## 2021-04-11 DIAGNOSIS — I10 Essential (primary) hypertension: Secondary | ICD-10-CM | POA: Diagnosis not present

## 2021-04-11 DIAGNOSIS — C8590 Non-Hodgkin lymphoma, unspecified, unspecified site: Secondary | ICD-10-CM | POA: Diagnosis not present

## 2021-04-11 HISTORY — PX: IR REMOVAL TUN ACCESS W/ PORT W/O FL MOD SED: IMG2290

## 2021-04-11 MED ORDER — SODIUM CHLORIDE 0.9 % IV SOLN: INTRAVENOUS | Status: DC

## 2021-04-11 MED ORDER — MIDAZOLAM HCL 2 MG/2ML IJ SOLN
INTRAMUSCULAR | Status: AC | PRN
  Administered 2021-04-11: 1 mg via INTRAVENOUS

## 2021-04-11 MED ORDER — FENTANYL CITRATE (PF) 100 MCG/2ML IJ SOLN
INTRAMUSCULAR | Status: AC
  Filled 2021-04-11: qty 2

## 2021-04-11 MED ORDER — LIDOCAINE-EPINEPHRINE 1 %-1:100000 IJ SOLN
INTRAMUSCULAR | Status: AC
  Filled 2021-04-11: qty 1

## 2021-04-11 MED ORDER — FENTANYL CITRATE (PF) 100 MCG/2ML IJ SOLN
INTRAMUSCULAR | Status: AC | PRN
  Administered 2021-04-11: 50 ug via INTRAVENOUS

## 2021-04-11 MED ORDER — LIDOCAINE-EPINEPHRINE (PF) 1 %-1:200000 IJ SOLN
INTRAMUSCULAR | Status: AC | PRN
  Administered 2021-04-11: 10 mL

## 2021-04-11 MED ORDER — MIDAZOLAM HCL 2 MG/2ML IJ SOLN
INTRAMUSCULAR | Status: AC
  Filled 2021-04-11: qty 2

## 2021-04-11 NOTE — Procedures (Signed)
Interventional Radiology Procedure Note  Procedure: Removal of portacatheter  Complications: None  Estimated Blood Loss: None  Recommendations: - DC Home  Signed,  Criselda Peaches, MD

## 2021-04-11 NOTE — H&P (Signed)
Chief Complaint: Patient was seen in consultation today for port-a-catheter removal   at the request of Ennever,Peter R  Referring Physician(s): Ennever,Peter R  Supervising Physician: Jacqulynn Cadet  Patient Status: [facility] - Out-pt  History of Present Illness: Amanda [name] is a 81 y.o. female w/ PMH of anemia, bronchiectasis, Lymphoma, HTN, GERD, HLD, HTN, PONV, DOE, splenomegaly. Pt received treatment for lymphoma that has been completed. Follow-up scan December 2022 showed no evidence of recurrent lymphoma. Pt referred here today by Dr. Marin Olp for port-a-catheter removal.   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[place])    Osteopenia    Pneumonia    Pneumonia due to COVID-19 virus 2021   Required hospitalization   PONV (postoperative nausea and vomiting)    needs little anesthesia   Shingles    Shortness of breath    on exertion   Spleen enlarged    SUI (stress urinary incontinence, female)    Urinary, incontinence, stress female    Wears glasses     Past Surgical History:  Procedure Laterality Date   BREAST EXCISIONAL BIOPSY Left [place]   CATARACT EXTRACTION  2009, 2011   BOTH EYES   CATARACT EXTRACTION, BILATERAL     CESAREAN SECTION  1959   CESAREAN SECTION     COLONOSCOPY      Dr Cristina Gong   DILATION AND CURETTAGE OF UTERUS     X2   HYSTEROSCOPY WITH D & C  01/07/2012   Procedure: DILATATION AND CURETTAGE /HYSTEROSCOPY;   Surgeon: Terrance Mass, MD;  Location: [facility];  Service: Gynecology;  Laterality: N/A;  intrauterine foley catheter for tamponode    IR IMAGING GUIDED PORT INSERTION  07/15/2018   LYMPH NODE BIOPSY Left 05/26/2018   Procedure: LEFT AXILLARY LYMPH NODE BIOPSY;  Surgeon: Fanny Skates, MD;  Location: [facility];  Service: General;  Laterality: Left;   ORIF ANKLE FRACTURE Left 12/12/2018   ORIF ANKLE FRACTURE Left 12/12/2018   Procedure: OPEN REDUCTION INTERNAL FIXATION (ORIF) ANKLE FRACTURE;  Surgeon: Meredith Pel, MD;  Location: [facility];  Service: Orthopedics;  Laterality: Left;   ORIF ANKLE FRACTURE Left 12/2018   TONSILLECTOMY     TONSILLECTOMY AND ADENOIDECTOMY     TUBAL LIGATION     BY LAPAROSCOPY   WISDOM TOOTH EXTRACTION      Allergies: Phenergan [promethazine hcl], Preservision areds 2 [multiple vitamins-minerals], Levaquin [levofloxacin], and Pravastatin  Medications: Prior to Admission medications   Medication Sig Start Date End Date Taking? Authorizing Provider  acetaminophen (TYLENOL) 325 MG tablet Take 1-2 tablets (325-650 mg total) by mouth every 8 (eight) hours as needed for mild pain. 04/19/20   Love, Ivan Anchors, PA-C  albuterol (VENTOLIN HFA) 108 (90 Base) MCG/ACT inhaler INHALE 2 PUFFS INTO THE LUNGS EVERY 6 HOURS AS NEEDED FOR  WHEEZING OR SHORTNESS OF BREATH 01/24/21   Carollee Herter, Alferd Apa, DO  amLODipine (NORVASC) 10 MG tablet Take 1 tablet (10 mg total) by mouth daily. 01/04/21   [name], DO  aspirin 81 MG chewable tablet Chew 1 tablet (81 mg total) by mouth daily. 12/25/18   Angiulli, Lavon Paganini, PA-C  benzonatate (TESSALON PERLES) 100 MG capsule Take 2 capsules (200 mg total) by mouth 3 (three) times daily as needed for cough. Patient not taking: Reported on 03/12/2021 01/23/21   Carollee Herter, Alferd Apa, DO  buPROPion (WELLBUTRIN XL) 150 MG 24 hr tablet TAKE 1 TABLET(150 MG) BY MOUTH DAILY 11/15/20   Carollee Herter, Alferd Apa, DO  Cholecalciferol (VITAMIN D3 ADULT  GUMMIES) 25 MCG (1000 UT) CHEW Chew 2 each by mouth daily.    [provider]  fluticasone-salmeterol (ADVAIR HFA) 115-21 MCG/ACT inhaler Inhale 2 puffs into the lungs 2 (two) times daily. Patient not taking: Reported on 03/12/2021 02/12/21   Carollee Herter, Kendrick Fries R, DO  levothyroxine (SYNTHROID) 125 MCG tablet TAKE 1 TABLET(125 MCG) BY MOUTH DAILY BEFORE AND BREAKFAST 10/23/20   Carollee Herter, Alferd Apa, DO  lidocaine-prilocaine (EMLA) cream Apply 1 application topically as needed. Patient not taking: Reported on 03/12/2021    [provider]  Multiple Vitamins-Minerals [facility] FORMULA) CAPS Take 1 capsule by mouth in the morning and at bedtime.    [provider]  pantoprazole (PROTONIX) 40 MG tablet Take 40 mg by mouth daily.    [provider]  promethazine-dextromethorphan (PROMETHAZINE-DM) 6.25-15 MG/5ML syrup Take 5 mLs by mouth 4 (four) times daily as needed. Patient not taking: Reported on 03/12/2021 02/09/21   Carollee Herter, Alferd Apa, DO  senna-docusate (SENOKOT-S) 8.6-50 MG tablet Take 2 tablets by mouth at bedtime. 04/19/20   Love, Ivan Anchors, PA-C     Family History  Problem Relation Age of Onset   Ovarian cancer Mother    Breast cancer Mother 73   Hypertension Father    Prostate cancer Father    Kidney failure Father    Diabetes Father    Hyperlipidemia Brother    COPD Paternal Grandfather    Stroke Maternal Grandfather    Hypercalcemia Neg Hx     Social History   Socioeconomic History   Marital status: Married    Spouse name: Not on file   Number of children: Not on file   Years of education: Not on file   Highest education level: Not on file  Occupational History   Not on file  Tobacco Use   Smoking status: Never   Smokeless tobacco: Never  Vaping Use   Vaping Use: Never used  Substance and Sexual Activity   Alcohol use: Yes    Comment: RARE   Drug use: Never   Sexual activity: Never    Birth control/protection: Post-menopausal   Other Topics Concern   Not on file  Social History Narrative   ** Merged History Encounter **       ** Merged History Encounter **       Social Determinants of Health   Financial Resource Strain: Low Risk    Difficulty of Paying Living Expenses: Not hard at all  Food Insecurity: No Food Insecurity   Worried About Charity fundraiser in the Last Year: Never true   [place] in the Last Year: Never true  Transportation Needs: No Transportation Needs   Lack of Transportation (Medical): No   Lack of Transportation (  Non-Medical): No  Physical Activity: Insufficiently Active   Days of Exercise per Week: 3 days   Minutes of Exercise per Session: 30 min  Stress: Not on file  Social Connections: Not on file    Review of Systems: A 12 point ROS discussed and pertinent positives are indicated in the HPI above.  All other systems are negative.  Review of Systems  Constitutional:  Negative for appetite change, chills and fever.  HENT:  Negative for nosebleeds.   Eyes:  Negative for visual disturbance.  Respiratory:  Negative for cough and shortness of breath.   Cardiovascular:  Negative for chest pain and leg swelling.  Gastrointestinal:  Negative for abdominal pain, blood in stool, nausea and vomiting.  Genitourinary:  Negative for hematuria.  Musculoskeletal:  Positive for back pain.  Neurological:  Negative for dizziness and headaches.   Vital Signs: BP (!) 156/71    Pulse 65    Temp 98 F (36.7 C) (Oral)    Resp 18    SpO2 99%   Physical Exam Constitutional:      Appearance: Normal appearance. She is not ill-appearing.  HENT:     Head: Normocephalic and atraumatic.     Mouth/Throat:     Mouth: Mucous membranes are moist.     Pharynx: Oropharynx is clear.  Eyes:     Extraocular Movements: Extraocular movements intact.     Pupils: Pupils are equal, round, and reactive to light.  Cardiovascular:     Rate and Rhythm: Normal rate and regular rhythm.     Pulses: Normal  pulses.     Heart sounds: Normal heart sounds. No murmur heard.   No friction rub. No gallop.  Pulmonary:     Effort: Pulmonary effort is normal. No respiratory distress.     Breath sounds: Normal breath sounds. No stridor. No wheezing, rhonchi or rales.  Abdominal:     General: Bowel sounds are normal. There is no distension.     Palpations: Abdomen is soft.     Tenderness: There is no abdominal tenderness. There is no guarding.  Musculoskeletal:     Right lower leg: No edema.     Left lower leg: No edema.  Skin:    General: Skin is warm and dry.     Comments: Right port in place with healed scar  Neurological:     Mental Status: She is alert and oriented to person, place, and time.  Psychiatric:        Mood and Affect: Mood normal.        Behavior: Behavior normal.        Thought Content: Thought content normal.        Judgment: Judgment normal.    Imaging: No results found.  Labs:  CBC: Recent Labs    08/29/20 0954 11/29/20 0915 01/21/21 1645 02/09/21 1545  WBC 5.9 7.9 16.8* 11.3*  HGB 12.5 12.3 12.7 11.8  HCT 37.3 36.7 37.8 37.2  PLT 243 357 253 493*    COAGS: No results for input(s): INR, APTT in the last 8760 hours.  BMP: Recent Labs    05/30/20 1039 08/29/20 0954 11/29/20 0915 01/04/21 0855 01/21/21 1645 02/09/21 1545  NA 137 140 143 141 138 141  K 4.4 3.7 4.1 4.6 3.3* 4.9  CL 102 103 105 103 102 102  CO2 28 29 28 30 25 26   GLUCOSE 93 101* 111* 74 103* 96  BUN 14 21 17 18 14 23   CALCIUM 9.5 9.6 9.6 9.7  8.6* 9.9  CREATININE 0.87 0.88 0.92 1.00 0.91 1.09*  GFRNONAA >60 >60 >60  --  >60  --     LIVER FUNCTION TESTS: Recent Labs    05/30/20 1039 08/29/20 0954 11/29/20 0915 01/04/21 0855 02/09/21 1545  BILITOT 0.3 0.3 0.2* 0.4 0.3  AST 18 16 12* 16 12  ALT 16 12 10 15 9   ALKPHOS 101 86 82 79  --   PROT 6.2* 5.9* 6.3* 6.1 6.3  ALBUMIN 4.2 4.2 4.0 4.2  --     TUMOR MARKERS: No results for input(s): AFPTM, CEA, CA199, CHROMGRNA in the  last 8760 hours.  Assessment and Plan: History of anemia, bronchiectasis, Lymphoma, HTN, GERD, HLD, HTN, PONV, DOE, splenomegaly. Pt received treatment for lymphoma that has been completed. Follow-up scan December 2022 showed no evidence of recurrent lymphoma. Pt referred here today by Dr. Marin Olp for port-a-catheter removal.   Pt resting in bed. She is A&O, calm and pleasant.  She is in no distress.  Pt states last food/drink was 0815 this morning.  She denies the use of blood thinning medications.   Risks and benefits of port-a-catheter removal was discussed with the patient including, but not limited to bleeding, infection, pneumothorax and need for additional procedures.  All of the patient's questions were answered, patient is agreeable to proceed. Consent signed and in chart.    Thank you for this interesting consult.  I greatly enjoyed meeting Amanda [name] and look forward to participating in their care.  A copy of this report was sent to the requesting provider on this date.  Electronically Signed: Tyson Alias, NP 04/11/2021, 1:57 PM   I spent a total of 20 minutes in face to face in clinical consultation, greater than 50% of which was counseling/coordinating care for port-a-catheter removal.

## 2021-04-11 NOTE — Discharge Instructions (Addendum)
Interventional radiology phone numbers [phone] After hours [phone]  Implanted Port Removal, Care After This sheet gives you information about how to care for yourself after your procedure. Your health care provider may also give you more specific instructions. If you have problems or questions, contact your health care provider. What can I expect after the procedure? After the procedure, it is common to have: Soreness or pain near your incision. Some swelling or bruising near your incision. Follow these instructions at home: Medicines Take over-the-counter and prescription medicines only as told by your health care provider. If you were prescribed an antibiotic medicine, take it as told by your health care provider. Do not stop taking the antibiotic even if you start to feel better. Bathing Do not take baths, swim, or use a hot tub until your health care provider approves. You may shower tomorrow. Remove your dressing prior to your shower. Incision care  Wound - May remove dressing and shower in 24 to 48 hours.  Keep site clean and dry.  Replace with bandaid as needed.    Do not submerge in tub or water until site healing well. If closed with glue, glue will flake off on its own.  NO creams or ointments until site has healed.   Follow instructions from your health care provider about how to take care of your incision. Make sure you: Wash your hands with soap and water before you change your bandage (dressing). If soap and water are not available, use hand sanitizer. Keep your dressing dry. Leave  skin glue in place. These skin closures may need to stay in place for 2 weeks or longer. . Check your incision area every day for signs of infection. Check for: More redness, swelling, or pain. More fluid or blood. Warmth. Pus or a bad smell.   Driving Do not drive for 24 hours if you were given a medicine to help you relax (sedative) during your procedure. If you did not receive a  sedative, ask your health care provider when it is safe to drive.   Activity Return to your normal activities as told by your health care provider. Ask your health care provider what activities are safe for you. Do not lift anything that is heavier than 10 lb (4.5 kg), or the limit that you are told, until your health care provider says that it is safe. Do not do activities that involve lifting your arms over your head. General instructions Do not use any products that contain nicotine or tobacco, such as cigarettes and e-cigarettes. These can delay healing. If you need help quitting, ask your health care provider. Keep all follow-up visits as told by your health care provider. This is important. Contact a health care provider if: You have more redness, swelling, or pain around your incision. You have more fluid or blood coming from your incision. Your incision feels warm to the touch. You have pus or a bad smell coming from your incision. You have pain that is not relieved by your pain medicine. Get help right away if you have: A fever or chills. Chest pain. Difficulty breathing. Summary After the procedure, it is common to have pain, soreness, swelling, or bruising near your incision. If you were prescribed an antibiotic medicine, take it as told by your health care provider. Do not stop taking the antibiotic even if you start to feel better. Do not drive for 24 hours if you were given a sedative during your procedure. Return to your normal activities  as told by your health care provider. Ask your health care provider what activities are safe for you. This information is not intended to replace advice given to you by your health care provider. Make sure you discuss any questions you have with your health care provider. Document Revised: 04/03/2017 Document Reviewed: 04/03/2017 Elsevier Patient Education  2021 [employer].     Moderate Conscious Sedation, Adult, Care After This  sheet gives you information about how to care for yourself after your procedure. Your health care provider may also give you more specific instructions. If you have problems or questions, contact your health care provider. What can I expect after the procedure? After the procedure, it is common to have: Sleepiness for several hours. Impaired judgment for several hours. Difficulty with balance. Vomiting if you eat too soon. Follow these instructions at home: For the time period you were told by your health care provider: Rest. Do not participate in activities where you could fall or become injured. Do not drive or use machinery. Do not drink alcohol. Do not take sleeping pills or medicines that cause drowsiness. Do not make important decisions or sign legal documents. Do not take care of children on your own.      Eating and drinking Follow the diet recommended by your health care provider. Drink enough fluid to keep your urine pale yellow. If you vomit: Drink water, juice, or soup when you can drink without vomiting. Make sure you have little or no nausea before eating solid foods.   General instructions Take over-the-counter and prescription medicines only as told by your health care provider. Have a responsible adult stay with you for the time you are told. It is important to have someone help care for you until you are awake and alert. Do not smoke. Keep all follow-up visits as told by your health care provider. This is important. Contact a health care provider if: You are still sleepy or having trouble with balance after 24 hours. You feel light-headed. You keep feeling nauseous or you keep vomiting. You develop a rash. You have a fever. You have redness or swelling around the IV site. Get help right away if: You have trouble breathing. You have new-onset confusion at home. Summary After the procedure, it is common to feel sleepy, have impaired judgment, or feel nauseous  if you eat too soon. Rest after you get home. Know the things you should not do after the procedure. Follow the diet recommended by your health care provider and drink enough fluid to keep your urine pale yellow. Get help right away if you have trouble breathing or new-onset confusion at home. This information is not intended to replace advice given to you by your health care provider. Make sure you discuss any questions you have with your health care provider. Document Revised: 06/18/2019 Document Reviewed: 01/14/2019 Elsevier Patient Education  2021 [employer].

## 2021-04-13 DIAGNOSIS — H53413 Scotoma involving central area, bilateral: Secondary | ICD-10-CM | POA: Diagnosis not present

## 2021-04-17 ENCOUNTER — Ambulatory Visit (INDEPENDENT_AMBULATORY_CARE_PROVIDER_SITE_OTHER): Payer: Medicare Other | Admitting: Family

## 2021-04-17 ENCOUNTER — Other Ambulatory Visit: Payer: Self-pay

## 2021-04-17 ENCOUNTER — Ambulatory Visit (HOSPITAL_BASED_OUTPATIENT_CLINIC_OR_DEPARTMENT_OTHER)
Admission: RE | Admit: 2021-04-17 | Discharge: 2021-04-17 | Disposition: A | Discharge status: Home / Self Care | Payer: Medicare Other | Source: Ambulatory Visit | Attending: Family | Admitting: Family

## 2021-04-17 ENCOUNTER — Encounter: Payer: Self-pay | Admitting: Family

## 2021-04-17 ENCOUNTER — Encounter: Payer: Self-pay | Admitting: Hematology & Oncology

## 2021-04-17 VITALS — BP 150/70 | HR 73 | Temp 98.6°F | Ht 60.0 in | Wt 144.8 lb

## 2021-04-17 DIAGNOSIS — R062 Wheezing: Secondary | ICD-10-CM | POA: Diagnosis not present

## 2021-04-17 DIAGNOSIS — R059 Cough, unspecified: Secondary | ICD-10-CM | POA: Diagnosis not present

## 2021-04-17 DIAGNOSIS — R0602 Shortness of breath: Secondary | ICD-10-CM

## 2021-04-17 DIAGNOSIS — J449 Chronic obstructive pulmonary disease, unspecified: Secondary | ICD-10-CM | POA: Diagnosis not present

## 2021-04-17 MED ORDER — ADVAIR HFA 115-21 MCG/ACT IN AERO: 2.0000 | INHALATION_SPRAY | Freq: Two times a day (BID) | RESPIRATORY_TRACT | 12 refills | Status: DC

## 2021-04-17 MED ORDER — DOXYCYCLINE HYCLATE 100 MG PO TABS: 100.0000 mg | ORAL_TABLET | Freq: Two times a day (BID) | ORAL | 0 refills | Status: DC

## 2021-04-17 MED ORDER — IPRATROPIUM-ALBUTEROL 0.5-2.5 (3) MG/3ML IN SOLN
3.0000 mL | Freq: Once | RESPIRATORY_TRACT | Status: AC
  Administered 2021-04-17: 3 mL via RESPIRATORY_TRACT

## 2021-04-17 MED ORDER — PREDNISONE 20 MG PO TABS: ORAL_TABLET | ORAL | 0 refills | Status: DC
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
## 2021-04-18 ENCOUNTER — Encounter: Payer: Self-pay | Admitting: Family Medicine

## 2021-04-19 ENCOUNTER — Encounter: Payer: Self-pay | Admitting: Hematology & Oncology

## 2021-04-19 ENCOUNTER — Inpatient Hospital Stay: Payer: Medicare Other | Attending: Hematology & Oncology

## 2021-04-19 ENCOUNTER — Other Ambulatory Visit: Payer: Self-pay

## 2021-04-19 ENCOUNTER — Inpatient Hospital Stay (HOSPITAL_BASED_OUTPATIENT_CLINIC_OR_DEPARTMENT_OTHER): Payer: Medicare Other | Admitting: Hematology & Oncology

## 2021-04-19 VITALS — BP 133/66 | HR 82 | Temp 98.3°F | Resp 19 | Ht 60.0 in | Wt 144.8 lb

## 2021-04-19 DIAGNOSIS — Z79899 Other long term (current) drug therapy: Secondary | ICD-10-CM | POA: Diagnosis not present

## 2021-04-19 DIAGNOSIS — R059 Cough, unspecified: Secondary | ICD-10-CM | POA: Insufficient documentation

## 2021-04-19 DIAGNOSIS — C8330 Diffuse large B-cell lymphoma, unspecified site: Secondary | ICD-10-CM | POA: Diagnosis not present

## 2021-04-19 DIAGNOSIS — C8580 Other specified types of non-Hodgkin lymphoma, unspecified site: Secondary | ICD-10-CM

## 2021-04-19 DIAGNOSIS — M4800 Spinal stenosis, site unspecified: Secondary | ICD-10-CM | POA: Insufficient documentation

## 2021-04-19 DIAGNOSIS — K909 Intestinal malabsorption, unspecified: Secondary | ICD-10-CM | POA: Diagnosis not present

## 2021-04-19 DIAGNOSIS — D649 Anemia, unspecified: Secondary | ICD-10-CM

## 2021-04-19 DIAGNOSIS — D508 Other iron deficiency anemias: Secondary | ICD-10-CM

## 2021-04-19 DIAGNOSIS — J449 Chronic obstructive pulmonary disease, unspecified: Secondary | ICD-10-CM | POA: Insufficient documentation

## 2021-04-19 DIAGNOSIS — D509 Iron deficiency anemia, unspecified: Secondary | ICD-10-CM | POA: Insufficient documentation

## 2021-04-19 LAB — CBC WITH DIFFERENTIAL (CANCER CENTER ONLY)
Abs Immature Granulocytes: 0.13 10*3/uL — ABNORMAL HIGH (ref 0.00–0.07)
Basophils Absolute: 0 10*3/uL (ref 0.0–0.1)
Basophils Relative: 0 %
Eosinophils Absolute: 0 10*3/uL (ref 0.0–0.5)
Eosinophils Relative: 0 %
HCT: 36.7 % (ref 36.0–46.0)
Hemoglobin: 12 g/dL (ref 12.0–15.0)
Immature Granulocytes: 1 %
Lymphocytes Relative: 2 %
Lymphs Abs: 0.3 10*3/uL — ABNORMAL LOW (ref 0.7–4.0)
MCH: 26.5 pg (ref 26.0–34.0)
MCHC: 32.7 g/dL (ref 30.0–36.0)
MCV: 81.2 fL (ref 80.0–100.0)
Monocytes Absolute: 1 10*3/uL (ref 0.1–1.0)
Monocytes Relative: 8 %
Neutro Abs: 12.2 10*3/uL — ABNORMAL HIGH (ref 1.7–7.7)
Neutrophils Relative %: 89 %
Platelet Count: 274 10*3/uL (ref 150–400)
RBC: 4.52 MIL/uL (ref 3.87–5.11)
RDW: 15.2 % (ref 11.5–15.5)
WBC Count: 13.7 10*3/uL — ABNORMAL HIGH (ref 4.0–10.5)
nRBC: 0 % (ref 0.0–0.2)

## 2021-04-19 LAB — CMP (CANCER CENTER ONLY)
ALT: 14 U/L (ref 0–44)
AST: 13 U/L — ABNORMAL LOW (ref 15–41)
Albumin: 3.8 g/dL (ref 3.5–5.0)
Alkaline Phosphatase: 87 U/L (ref 38–126)
Anion gap: 11 (ref 5–15)
BUN: 22 mg/dL (ref 8–23)
CO2: 25 mmol/L (ref 22–32)
Calcium: 9.2 mg/dL (ref 8.9–10.3)
Chloride: 99 mmol/L (ref 98–111)
Creatinine: 0.94 mg/dL (ref 0.44–1.00)
GFR, Estimated: >60 mL/min (ref >60)
Glucose, Bld: 122 mg/dL — ABNORMAL HIGH (ref 70–99)
Potassium: 3.3 mmol/L — ABNORMAL LOW (ref 3.5–5.1)
Sodium: 135 mmol/L (ref 135–145)
Total Bilirubin: 0.4 mg/dL (ref 0.3–1.2)
Total Protein: 6.7 g/dL (ref 6.5–8.1)

## 2021-04-19 LAB — LACTATE DEHYDROGENASE: LDH: 85 U/L — ABNORMAL LOW (ref 98–192)

## 2021-04-19 NOTE — Progress Notes (Addendum)
Hematology and Oncology Follow Up Visit  Amanda [name] [id number] 06-Mar-1940 81 y.o. 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  Unfortunately, she is having a problem with COPD.  She is coughing.  She does cough up some purulent sputum.  She is going to see a pulmonologist.  She is on doxycycline and prednisone right now.  She is taking inhalers.  She has some nebulizers.  Otherwise, she is doing okay.  She has had no fever.  She has had no bleeding.  There is been no change in bowel or bladder habits.  She has had no rashes.  There is been no leg swelling.  Her back has bad spinal stenosis.  She cannot walk all that much.  She has had steroid injections but these really have not helped.  She completed her maintenance Rituxan back in November 2022.  We did a PET scan on her in December 2022.  The PET scan did not show any evidence of residual/recurrent large cell lymphoma.  There is been no headache.  Currently, I would say that her performance status is ECOG 2.        Medications:  Current Outpatient Medications:    acetaminophen (TYLENOL) 325 MG tablet, Take 1-2 tablets (325-650 mg total) by mouth every 8 (eight) hours as needed for mild pain., Disp: , Rfl:    albuterol (VENTOLIN HFA) 108 (90 Base) MCG/ACT inhaler, INHALE 2 PUFFS INTO THE LUNGS EVERY 6 HOURS AS NEEDED FOR WHEEZING OR SHORTNESS OF BREATH, Disp: 54 g, Rfl: 0   amLODipine (NORVASC) 10 MG tablet, Take 1 tablet (10 mg total) by mouth daily., Disp: 90 tablet, Rfl: 1   aspirin 81 MG chewable tablet, Chew 1 tablet (81 mg total) by mouth daily., Disp:  , Rfl:    buPROPion (WELLBUTRIN XL) 150 MG 24 hr tablet, TAKE 1 TABLET(150 MG) BY MOUTH DAILY,  Disp: 90 tablet, Rfl: 1   Cholecalciferol (VITAMIN D3 ADULT GUMMIES) 25 MCG (1000 UT) CHEW, Chew 2 each by mouth daily., Disp: , Rfl:    doxycycline (VIBRA-TABS) 100 MG tablet, Take 1 tablet (100 mg total) by mouth 2 (two) times daily., Disp: 14 tablet, Rfl: 0   fluticasone-salmeterol (ADVAIR HFA) 115-21 MCG/ACT inhaler, Inhale 2 puffs into the lungs 2 (two) times daily., Disp: 1 each, Rfl: 12   levothyroxine (SYNTHROID) 125 MCG tablet, TAKE 1 TABLET(125 MCG) BY MOUTH DAILY BEFORE AND BREAKFAST, Disp: 90 tablet, Rfl: 1   pantoprazole (PROTONIX) 40 MG tablet, Take 40 mg by mouth daily., Disp: , Rfl:    predniSONE (DELTASONE) 20 MG tablet, Take 2 tablet daily x 2 days, then 1 tablet daily x 5 days, Disp: 9 tablet, Rfl: 0   senna-docusate (SENOKOT-S) 8.6-50 MG tablet, Take 2 tablets by mouth at bedtime., Disp: 60 tablet, Rfl: 0 No current facility-administered medications for this visit.  Facility-Administered Medications Ordered in Other Visits:    diphenhydrAMINE (BENADRYL) capsule 50 mg, 50 mg, Oral, Once, Tish Men, MD   heparin lock flush 100 unit/mL, 500 Units, Intracatheter, Once PRN, Tish Men, MD   sodium chloride flush (NS) 0.9 % injection 10 mL, 10 mL, Intracatheter, PRN, Tish Men, MD  Allergies:  Allergies  Allergen Reactions   Phenergan [Promethazine Hcl] Other (See Comments)    Jerking/agitation   Preservision Areds 2 [Multiple Vitamins-Minerals] Nausea Only   Levaquin [Levofloxacin] Nausea And Vomiting   Pravastatin Nausea And Vomiting    Past Medical History, Surgical history, Social history, and Family History were reviewed and updated.  Review of Systems: Review of Systems  Constitutional: Negative.   HENT:  Negative.    Eyes: Negative.   Respiratory: Negative.    Cardiovascular: Negative.   Gastrointestinal: Negative.   Endocrine: Negative.   Genitourinary: Negative.    Musculoskeletal: Negative.   Skin: Negative.   Neurological: Negative.   Hematological:  Negative.   Psychiatric/Behavioral: Negative.     Physical Exam:  height is 5' (1.524 m) and weight is 144 lb 12.8 oz (65.7 kg). Her oral temperature is 98.3 F (36.8 C). Her blood pressure is 133/66 and her pulse is 82. Her respiration is 19 and oxygen saturation is 95%.   Wt Readings from Last 3 Encounters:  04/19/21 144 lb 12.8 oz (65.7 kg)  04/17/21 144 lb 12.8 oz (65.7 kg)  03/12/21 145 lb (65.8 kg)    Physical Exam Vitals reviewed.  HENT:     Head: Normocephalic and atraumatic.  Eyes:     Pupils: Pupils are equal, round, and reactive to light.  Cardiovascular:     Rate and Rhythm: Normal rate and regular rhythm.     Heart sounds: Normal heart sounds.  Pulmonary:     Effort: Pulmonary effort is normal.     Breath sounds: Normal breath sounds.  Abdominal:     General: Bowel sounds are normal.     Palpations: Abdomen is soft.  Musculoskeletal:        General: No tenderness or deformity. Normal range of motion.     Cervical back: Normal range of motion.  Lymphadenopathy:     Cervical: No cervical adenopathy.  Skin:    General: Skin is warm and dry.     Findings: No erythema or rash.  Neurological:     Mental Status: She is alert and oriented to person, place, and time.  Psychiatric:        Behavior: Behavior normal.        Thought Content: Thought content normal.        Judgment: Judgment normal.     Lab Results  Component Value Date   WBC 13.7 (H) 04/19/2021   HGB 12.0 04/19/2021   HCT 36.7 04/19/2021   MCV 81.2 04/19/2021   PLT 274 04/19/2021     Chemistry      Component Value Date/Time   NA 141 02/09/2021 1545   NA 146 (H) 05/26/2019 1108   K 4.9 02/09/2021 1545   CL 102 02/09/2021 1545   CO2 26 02/09/2021 1545   BUN 23 02/09/2021 1545   BUN 17 05/26/2019 1108   CREATININE 1.09 (H) 02/09/2021 1545   GLU 91 06/23/2018 0000      Component Value Date/Time   CALCIUM 9.9 02/09/2021 1545   CALCIUM 12.3 (H) 06/16/2018 1639   ALKPHOS 79 01/04/2021  0855   AST 12 02/09/2021 1545   AST 12 (L) 11/29/2020 0915   ALT 9 02/09/2021 1545   ALT 10 11/29/2020 0915   BILITOT 0.3 02/09/2021 1545   BILITOT 0.2 (L) 11/29/2020 0915      Impression and Plan: Amanda [name] is a very charming 81 year old white female.  She has a transformed marginal zone lymphoma.  She received maintenance Rituxan.  She completed this back in November 2022.  Her PET scan looks fantastic after the Rituxan was finished.  I do not see that we have to do any scans on her.  I do not think this COPD is anything related to the Rituxan.  It may be reasonable to recheck her immunoglobulin levels.  Last on that she had this checked for 3 years ago.  Her IgG level is down a little bit.  If the IgG level is down lower, she may benefit from IVIG.  I noticed that her iron studies were incredibly low.  Her iron saturation is only 7%.  They were going to have to give her some IV iron.  This may help with the breathing.  Given the situation, we will plan to get her back in a couple months just to follow-up to see how she is feeling.    Volanda Napoleon, MD 2/16/20232:06 PM

## 2021-04-20 ENCOUNTER — Telehealth: Payer: Self-pay | Admitting: Hematology & Oncology

## 2021-04-20 ENCOUNTER — Encounter: Payer: Self-pay | Admitting: Hematology & Oncology

## 2021-04-20 ENCOUNTER — Telehealth: Payer: Self-pay | Admitting: *Deleted

## 2021-04-20 ENCOUNTER — Other Ambulatory Visit: Payer: Self-pay | Admitting: Family Medicine

## 2021-04-20 DIAGNOSIS — K909 Intestinal malabsorption, unspecified: Secondary | ICD-10-CM

## 2021-04-20 DIAGNOSIS — D509 Iron deficiency anemia, unspecified: Secondary | ICD-10-CM

## 2021-04-20 DIAGNOSIS — R059 Cough, unspecified: Secondary | ICD-10-CM

## 2021-04-20 HISTORY — DX: Iron deficiency anemia, unspecified: D50.9

## 2021-04-20 HISTORY — DX: Intestinal malabsorption, unspecified: K90.9

## 2021-04-20 LAB — IRON AND IRON BINDING CAPACITY (CC-WL,HP ONLY)
Iron: 21 ug/dL — ABNORMAL LOW (ref 28–170)
Saturation Ratios: 7 % — ABNORMAL LOW (ref 10.4–31.8)
TIBC: 321 ug/dL (ref 250–450)
UIBC: 300 ug/dL (ref 148–442)

## 2021-04-20 LAB — IGG, IGA, IGM
IgA: 19 mg/dL — ABNORMAL LOW (ref 64–422)
IgG (Immunoglobin G), Serum: 206 mg/dL — ABNORMAL LOW (ref 586–1602)
IgM (Immunoglobulin M), Srm: <5 mg/dL — ABNORMAL LOW (ref 26–217)

## 2021-04-20 LAB — FERRITIN: Ferritin: 445 ng/mL — ABNORMAL HIGH (ref 11–307)

## 2021-04-20 NOTE — Addendum Note (Signed)
Addended by: Burney Gauze R on: 04/20/2021 01:41 PM   Modules accepted: Orders

## 2021-04-20 NOTE — Telephone Encounter (Signed)
As noted below by Dr. Marin Olp, I informed the patient that her iron level is incredibly low. This can contribute to why  you are feeling tired and fatigued. He would like for you to get one  dose of IV iron (Monoferric has been authorized). She verbalized understanding. LOS sent to scheduling.

## 2021-04-20 NOTE — Telephone Encounter (Signed)
Called to schedule 1 dose of iron per 2/17 sch msg, left voicemail

## 2021-04-20 NOTE — Telephone Encounter (Signed)
-----   Message from Volanda Napoleon, MD sent at 04/20/2021  1:36 PM EST ----- Call and let him know that the iron is incredibly low.  This might be why she does not feel all that well.  She will need a dose of IV iron.  Laurey Arrow

## 2021-04-20 NOTE — Telephone Encounter (Signed)
Hey you seen this pt on Tue can you refill for her?

## 2021-04-22 ENCOUNTER — Other Ambulatory Visit: Payer: Self-pay | Admitting: Family Medicine

## 2021-04-25 ENCOUNTER — Other Ambulatory Visit: Payer: Self-pay

## 2021-04-25 ENCOUNTER — Inpatient Hospital Stay: Payer: Medicare Other

## 2021-04-25 VITALS — BP 144/59 | HR 74 | Temp 98.1°F | Resp 22

## 2021-04-25 DIAGNOSIS — C8330 Diffuse large B-cell lymphoma, unspecified site: Secondary | ICD-10-CM | POA: Diagnosis not present

## 2021-04-25 DIAGNOSIS — D501 Sideropenic dysphagia: Secondary | ICD-10-CM

## 2021-04-25 DIAGNOSIS — K909 Intestinal malabsorption, unspecified: Secondary | ICD-10-CM

## 2021-04-25 DIAGNOSIS — M4800 Spinal stenosis, site unspecified: Secondary | ICD-10-CM | POA: Diagnosis not present

## 2021-04-25 DIAGNOSIS — D509 Iron deficiency anemia, unspecified: Secondary | ICD-10-CM | POA: Diagnosis not present

## 2021-04-25 DIAGNOSIS — R059 Cough, unspecified: Secondary | ICD-10-CM | POA: Diagnosis not present

## 2021-04-25 DIAGNOSIS — J449 Chronic obstructive pulmonary disease, unspecified: Secondary | ICD-10-CM | POA: Diagnosis not present

## 2021-04-25 DIAGNOSIS — Z79899 Other long term (current) drug therapy: Secondary | ICD-10-CM | POA: Diagnosis not present

## 2021-04-25 MED ORDER — SODIUM CHLORIDE 0.9 % IV SOLN
1000.0000 mg | Freq: Once | INTRAVENOUS | Status: AC
  Administered 2021-04-25: 1000 mg via INTRAVENOUS
  Filled 2021-04-25: qty 10

## 2021-04-25 MED ORDER — SODIUM CHLORIDE 0.9 % IV SOLN: Freq: Once | INTRAVENOUS | Status: AC

## 2021-04-25 NOTE — Patient Instructions (Signed)
Ferric Derisomaltose Injection What is this medication? FERRIC DERISOMALTOSE (FER ik der EYE soe MAWL tose) treats low levels of iron in your body (iron deficiency anemia). Iron is a mineral that plays an important role in making red blood cells, which carry oxygen from your lungs to the rest of your body. This medicine may be used for other purposes; ask your health care provider or pharmacist if you have questions. COMMON BRAND NAME(S): MONOFERRIC What should I tell my care team before I take this medication? They need to know if you have any of these conditions: High levels of iron in the blood An unusual or allergic reaction to iron, other medications, foods, dyes, or preservatives Pregnant or trying to get pregnant Breast-feeding How should I use this medication? This medication is for injection into a vein. It is given in a hospital or clinic setting. Talk to your care team about the use of this medication in children. Special care may be needed. Overdosage: If you think you have taken too much of this medicine contact a poison control center or emergency room at once. NOTE: This medicine is only for you. Do not share this medicine with others. What if I miss a dose? It is important not to miss your dose. Call your care team if you are unable to keep an appointment. What may interact with this medication? Do not take this medication with any of the following: Deferoxamine Dimercaprol Other iron products This list may not describe all possible interactions. Give your health care provider a list of all the medicines, herbs, non-prescription drugs, or dietary supplements you use. Also tell them if you smoke, drink alcohol, or use illegal drugs. Some items may interact with your medicine. What should I watch for while using this medication? Visit your care team regularly. Tell your care team if your symptoms do not start to get better or if they get worse. You may need blood work done  while you are taking this medication. You may need to follow a special diet. Talk to your care team. Foods that contain iron include whole grains/cereals, dried fruits, beans, or peas, leafy green vegetables, and organ meats (liver, kidney). What side effects may I notice from receiving this medication? Side effects that you should report to your care team as soon as possible: Allergic reactions--skin rash, itching, hives, swelling of the face, lips, tongue, or throat Low blood pressure--dizziness, feeling faint or lightheaded, blurry vision Shortness of breath Side effects that usually do not require medical attention (report to your care team if they continue or are bothersome): Flushing Headache Joint pain Muscle pain Nausea Pain, redness, or irritation at injection site This list may not describe all possible side effects. Call your doctor for medical advice about side effects. You may report side effects to FDA at 1-800-FDA-1088. Where should I keep my medication? This medication is given in a hospital or clinic and will not be stored at home. NOTE: This sheet is a summary. It may not cover all possible information. If you have questions about this medicine, talk to your doctor, pharmacist, or health care provider.  2022 Elsevier/Gold Standard (2020-07-14 00:00:00)  

## 2021-04-26 ENCOUNTER — Encounter: Payer: Self-pay | Admitting: Family Medicine

## 2021-04-26 ENCOUNTER — Ambulatory Visit (INDEPENDENT_AMBULATORY_CARE_PROVIDER_SITE_OTHER): Payer: Medicare Other | Admitting: Family Medicine

## 2021-04-26 VITALS — BP 122/80 | HR 83 | Temp 97.8°F | Resp 20 | Ht 60.0 in | Wt 145.0 lb

## 2021-04-26 DIAGNOSIS — R052 Subacute cough: Secondary | ICD-10-CM

## 2021-04-26 DIAGNOSIS — J189 Pneumonia, unspecified organism: Secondary | ICD-10-CM | POA: Diagnosis not present

## 2021-04-26 DIAGNOSIS — N393 Stress incontinence (female) (male): Secondary | ICD-10-CM | POA: Diagnosis not present

## 2021-04-26 LAB — POC URINALSYSI DIPSTICK (AUTOMATED)
Bilirubin, UA: NEGATIVE
Blood, UA: NEGATIVE
Glucose, UA: NEGATIVE
Ketones, UA: NEGATIVE
Leukocytes, UA: NEGATIVE
Nitrite, UA: NEGATIVE
Protein, UA: NEGATIVE
Spec Grav, UA: <=1.005 — AB (ref 1.010–1.025)
Urobilinogen, UA: 0.2 E.U./dL
pH, UA: 5 (ref 5.0–8.0)

## 2021-04-26 MED ORDER — TOLTERODINE TARTRATE ER 4 MG PO CP24: 4.0000 mg | ORAL_CAPSULE | Freq: Every day | ORAL | 2 refills | Status: DC

## 2021-04-26 MED ORDER — CLARITHROMYCIN ER 500 MG PO TB24: 1000.0000 mg | ORAL_TABLET | Freq: Every day | ORAL | 0 refills | Status: DC

## 2021-04-26 MED ORDER — PROMETHAZINE-DM 6.25-15 MG/5ML PO SYRP: 5.0000 mL | ORAL_SOLUTION | Freq: Four times a day (QID) | ORAL | 0 refills | Status: DC | PRN

## 2021-04-26 NOTE — Patient Instructions (Signed)
Community-Acquired Pneumonia, Adult ?Pneumonia is a lung infection that causes inflammation and the buildup of mucus and fluids in the lungs. This may cause coughing and difficulty breathing. Community-acquired pneumonia is pneumonia that develops in people who are not, and have not recently been, in a hospital or other health care facility. ?Usually, pneumonia develops as a result of an illness that is caused by a virus, such as the common cold and the flu (influenza). It can also be caused by bacteria or fungi. While the common cold and influenza can pass from person to person (are contagious), pneumonia itself is not considered contagious. ?What are the causes? ?This condition may be caused by: ?Viruses. ?Bacteria. ?Fungi, such as molds or mushrooms. ?What increases the risk? ?The following factors may make you more likely to develop this condition: ?Having certain medical conditions, such as: ?A long-term (chronic) disease, which may include chronic obstructive pulmonary disease (COPD), asthma, heart failure, cystic fibrosis, diabetes, kidney disease, sickle cell disease, and human immunodeficiency virus (HIV). ?A condition that increases the risk of breathing in (aspirating) mucus and other fluids from your mouth and nose. ?A weakened body defense system (immune system). ?Having had your spleen removed (splenectomy). The spleen is the organ that helps fight germs and infections. ?Not cleaning your teeth and gums well (poor dental hygiene). ?Using tobacco products. ?Traveling to places where germs that cause pneumonia are present. ?Being near certain animals, or animal habitats, that have germs that cause pneumonia. ?Being older than 83 years of age. ?What are the signs or symptoms? ?Symptoms of this condition include: ?A dry cough or a wet (productive) cough. ?A fever. ?Sweating or chills. ?Chest pain, especially when breathing deeply or coughing. ?Fast breathing, difficulty breathing, or shortness of  breath. ?Tiredness (fatigue). ?Muscle aches. ?How is this diagnosed? ?This condition may be diagnosed based on your medical history or a physical exam. You may also have tests, including: ?Chest X-rays. ?Tests of the level of oxygen and other gases in your blood. ?Tests of: ?Your blood. ?Mucus from your lungs (sputum). ?Fluid around your lungs (pleural fluid). ?Your urine. ?If your pneumonia is severe, other tests may be done to learn more about the cause. ?How is this treated? ?Treatment for this condition depends on many factors, such as the cause of your pneumonia, your medicines, and other medical conditions that you have. ?For most adults, pneumonia may be treated at home. In some cases, treatment must happen in a hospital and may include: ?Medicines that are given by mouth (orally) or through an IV, including: ?Antibiotic medicines, if bacteria caused the pneumonia. ?Medicines that kill viruses (antiviral medicines), if a virus caused the pneumonia. ?Oxygen therapy. ?Severe pneumonia, although rare, may require the following treatments: ?Mechanical ventilation.This procedure uses a machine to help you breathe if you cannot breathe well on your own or maintain a safe level of blood oxygen. ?Thoracentesis. This procedure removes any buildup of pleural fluid to help with breathing. ?Follow these instructions at home: ?Medicines ?Take over-the-counter and prescription medicines only as told by your health care provider. ?Take cough medicine only if you have trouble sleeping. Cough medicine can prevent your body from removing mucus from your lungs. ?If you were prescribed an antibiotic medicine, take it as told by your health care provider. Do not stop taking the antibiotic even if you start to feel better. ?Lifestyle ?  ?Do not drink alcohol. ?Do not use any products that contain nicotine or tobacco, such as cigarettes, e-cigarettes, and chewing tobacco. If you   need help quitting, ask your health care  provider. ?Eat a healthy diet. This includes plenty of vegetables, fruits, whole grains, low-fat dairy products, and lean protein. ?General instructions ?Rest a lot and get at least 8 hours of sleep each night. ?Sleep in a partly upright position at night. Place a few pillows under your head or sleep in a reclining chair. ?Return to your normal activities as told by your health care provider. Ask your health care provider what activities are safe for you. ?Drink enough fluid to keep your urine pale yellow. This helps to thin the mucus in your lungs. ?If your throat is sore, gargle with a salt-water mixture 3-4 times a day or as needed. To make a salt-water mixture, completely dissolve ?-1 tsp (3-6 g) of salt in 1 cup (237 mL) of warm water. ?Keep all follow-up visits as told by your health care provider. This is important. ?How is this prevented? ?You can lower your risk of developing community-acquired pneumonia by: ?Getting the pneumonia vaccine. There are different types and schedules of pneumonia vaccines. Ask your health care provider which option is best for you. Consider getting the pneumonia vaccine if: ?You are older than 83 years of age. ?You are 19-65 years of age and are receiving cancer treatment, have chronic lung disease, or have other medical conditions that affect your immune system. Ask your health care provider if this applies to you. ?Getting your influenza vaccine every year. Ask your health care provider which type of vaccine is best for you. ?Getting regular dental checkups. ?Washing your hands often with soap and water for at least 20 seconds. If soap and water are not available, use hand sanitizer. ?Contact a health care provider if you have: ?A fever. ?Trouble sleeping because you cannot control your cough with cough medicine. ?Get help right away if: ?Your shortness of breath becomes worse. ?Your chest pain increases. ?Your sickness becomes worse, especially if you are an older adult or  have a weak immune system. ?You cough up blood. ?These symptoms may represent a serious problem that is an emergency. Do not wait to see if the symptoms will go away. Get medical help right away. Call your local emergency services (911 in the [place] Do not drive yourself to the hospital. ?Summary ?Pneumonia is an infection of the lungs. ?Community-acquired pneumonia develops in people who have not been in the hospital. It can be caused by bacteria, viruses, or fungi. ?This condition may be treated with antibiotics or antiviral medicines. ?Severe pneumonia may require a hospital stay and treatment to help with breathing. ?This information is not intended to replace advice given to you by your health care provider. Make sure you discuss any questions you have with your health care provider. ?Document Revised: 12/01/2018 Document Reviewed: 12/01/2018 ?Elsevier Patient Education ? 2022 [employer] ? ?

## 2021-04-26 NOTE — Progress Notes (Signed)
Subjective:   By signing my name below, I, Amanda [name], attest that this documentation has been prepared under the direction and in the presence of Amanda Held, DO  04/26/2021    Patient ID: Amanda [name], female    DOB: [birth year], 81 y.o.   MRN: [id number]  Chief Complaint  Patient presents with   Bronchitis   Follow-up    HPI Patient is in today for a office visit.  She continues complaining of coughing and congestion after finishing her anti-biotic course. She was found to not have pneumonia on her recent x-ray. She continues using a nebulizer a couple of times every day. The radiologist found she could have COPD. She is requesting a refill for her cough syrup.  She continues having urinary incontinence. She finds it has worsened recently. She has not tried medication to manage it and is interested in trying something.  She reports having an iron infusion a couple of days ago. She does not feel improvement at this time.    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   Lumbar burst fracture ([place])    Lymphoproliferative disorder ([place])    Macular degeneration 2013   Both eyes    Macular degeneration, bilateral    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[place])    Osteopenia    Pneumonia    Pneumonia due to COVID-19 virus 2021   Required hospitalization   PONV (postoperative nausea and vomiting)    needs little anesthesia   Shingles    Shortness of breath    on exertion   Spleen enlarged    SUI (stress urinary incontinence, female)    Urinary, incontinence, stress female     Wears glasses     Past Surgical History:  Procedure Laterality Date   BREAST EXCISIONAL BIOPSY Left [place]   CATARACT EXTRACTION  2009, 2011   BOTH EYES   CATARACT EXTRACTION, BILATERAL     CESAREAN SECTION  1959   CESAREAN SECTION     COLONOSCOPY      Dr Cristina Gong   DILATION AND CURETTAGE OF UTERUS     X2   HYSTEROSCOPY WITH D & C  01/07/2012   Procedure: DILATATION AND CURETTAGE /HYSTEROSCOPY;  Surgeon: Terrance Mass, MD;  Location: [facility];  Service: Gynecology;  Laterality: N/A;  intrauterine foley catheter for tamponode    IR IMAGING GUIDED PORT INSERTION  07/15/2018   IR REMOVAL TUN ACCESS W/ PORT W/O FL MOD SED  04/11/2021   LYMPH NODE BIOPSY Left 05/26/2018   Procedure: LEFT AXILLARY LYMPH NODE BIOPSY;  Surgeon: Fanny Skates, MD;  Location: [facility];  Service: General;  Laterality: Left;   ORIF ANKLE FRACTURE Left 12/12/2018   ORIF ANKLE FRACTURE Left 12/12/2018   Procedure: OPEN REDUCTION INTERNAL FIXATION (ORIF) ANKLE FRACTURE;  Surgeon: Meredith Pel, MD;  Location: [facility];  Service: Orthopedics;  Laterality: Left;   ORIF ANKLE FRACTURE Left 12/2018  TONSILLECTOMY     TONSILLECTOMY AND ADENOIDECTOMY     TUBAL LIGATION     BY LAPAROSCOPY   WISDOM TOOTH EXTRACTION      Family History  Problem Relation Age of Onset   Ovarian cancer Mother    Breast cancer Mother 63   Hypertension Father    Prostate cancer Father    Kidney failure Father    Diabetes Father    Hyperlipidemia Brother    COPD Paternal Grandfather    Stroke Maternal Grandfather    Hypercalcemia Neg Hx     Social History   Socioeconomic History   Marital status: Married    Spouse name: Not on file   Number of children: Not on file   Years of education: Not on file   Highest education level: Not on file  Occupational History   Not on file  Tobacco Use   Smoking status: Never   Smokeless tobacco: Never  Vaping Use   Vaping Use: Never used  Substance and Sexual  Activity   Alcohol use: Yes    Comment: RARE   Drug use: Never   Sexual activity: Never    Birth control/protection: Post-menopausal  Other Topics Concern   Not on file  Social History Narrative   ** Merged History Encounter **       ** Merged History Encounter **       Social Determinants of Health   Financial Resource Strain: Low Risk    Difficulty of Paying Living Expenses: Not hard at all  Food Insecurity: No Food Insecurity   Worried About Charity fundraiser in the Last Year: Never true   Ran Out of Food in the Last Year: Never true  Transportation Needs: No Transportation Needs   Lack of Transportation (Medical): No   Lack of Transportation (Non-Medical): No  Physical Activity: Insufficiently Active   Days of Exercise per Week: 3 days   Minutes of Exercise per Session: 30 min  Stress: Not on file  Social Connections: Not on file  Intimate Partner Violence: Not on file    Outpatient Medications Prior to Visit  Medication Sig Dispense Refill   acetaminophen (TYLENOL) 325 MG tablet Take 1-2 tablets (325-650 mg total) by mouth every 8 (eight) hours as needed for mild pain.     albuterol (VENTOLIN HFA) 108 (90 Base) MCG/ACT inhaler INHALE 2 PUFFS INTO THE LUNGS EVERY 6 HOURS AS NEEDED FOR WHEEZING OR SHORTNESS OF BREATH 54 g 0   amLODipine (NORVASC) 10 MG tablet Take 1 tablet (10 mg total) by mouth daily. 90 tablet 1   aspirin 81 MG chewable tablet Chew 1 tablet (81 mg total) by mouth daily.     buPROPion (WELLBUTRIN XL) 150 MG 24 hr tablet TAKE 1 TABLET(150 MG) BY MOUTH DAILY 90 tablet 1   Cholecalciferol (VITAMIN D3 ADULT GUMMIES) 25 MCG (1000 UT) CHEW Chew 2 each by mouth daily.     fluticasone-salmeterol (ADVAIR HFA) 115-21 MCG/ACT inhaler Inhale 2 puffs into the lungs 2 (two) times daily. 1 each 12   levothyroxine (SYNTHROID) 125 MCG tablet TAKE 1 TABLET(125 MCG) BY MOUTH DAILY BEFORE AND BREAKFAST 90 tablet 1   pantoprazole (PROTONIX) 40 MG tablet Take 40 mg by mouth  daily.     senna-docusate (SENOKOT-S) 8.6-50 MG tablet Take 2 tablets by mouth at bedtime. 60 tablet 0   doxycycline (VIBRA-TABS) 100 MG tablet Take 1 tablet (100 mg total) by mouth 2 (two) times daily. (Patient not taking: Reported on  04/26/2021) 14 tablet 0   predniSONE (DELTASONE) 20 MG tablet Take 2 tablet daily x 2 days, then 1 tablet daily x 5 days (Patient not taking: Reported on 04/26/2021) 9 tablet 0   Facility-Administered Medications Prior to Visit  Medication Dose Route Frequency Provider Last Rate Last Admin   diphenhydrAMINE (BENADRYL) capsule 50 mg  50 mg Oral Once Tish Men, MD       heparin lock flush 100 unit/mL  500 Units Intracatheter Once PRN Tish Men, MD       sodium chloride flush (NS) 0.9 % injection 10 mL  10 mL Intracatheter PRN Tish Men, MD        Allergies  Allergen Reactions   Phenergan [Promethazine Hcl] Other (See Comments)    Jerking/agitation   Preservision Areds 2 [Multiple Vitamins-Minerals] Nausea Only   Levaquin [Levofloxacin] Nausea And Vomiting   Pravastatin Nausea And Vomiting    Review of Systems  HENT:  Positive for congestion.   Respiratory:  Positive for cough.   Genitourinary:        (+)urinary incontinence      Objective:    Physical Exam Constitutional:      General: She is not in acute distress.    Appearance: Normal appearance. She is not ill-appearing.  HENT:     Head: Normocephalic and atraumatic.     Right Ear: External ear normal.     Left Ear: External ear normal.  Eyes:     Extraocular Movements: Extraocular movements intact.     Pupils: Pupils are equal, round, and reactive to light.  Cardiovascular:     Rate and Rhythm: Normal rate and regular rhythm.     Heart sounds: Normal heart sounds. No murmur heard.   No gallop.  Pulmonary:     Effort: Pulmonary effort is normal.     Breath sounds: Wheezing and rhonchi present.  Skin:    General: Skin is warm and dry.  Neurological:     Mental Status: She is alert and  oriented to person, place, and time.  Psychiatric:        Behavior: Behavior normal.        Judgment: Judgment normal.    BP 122/80 (BP Location: Left Arm, Patient Position: Sitting, Cuff Size: Normal)    Pulse 83    Temp 97.8 F (36.6 C) (Oral)    Resp 20    Ht 5' (1.524 m)    Wt 145 lb (65.8 kg)    SpO2 96%    BMI 28.32 kg/m  Wt Readings from Last 3 Encounters:  04/26/21 145 lb (65.8 kg)  04/19/21 144 lb 12.8 oz (65.7 kg)  04/17/21 144 lb 12.8 oz (65.7 kg)    Diabetic Foot Exam - Simple   No data filed    Lab Results  Component Value Date   WBC 14.7 (H) 04/26/2021   HGB 11.4 (L) 04/26/2021   HCT 34.7 (L) 04/26/2021   PLT 360.0 04/26/2021   GLUCOSE 95 04/26/2021   CHOL 222 (H) 01/04/2021   TRIG 146.0 01/04/2021   HDL 68.30 01/04/2021   LDLDIRECT 123.0 04/27/2020   LDLCALC 125 (H) 01/04/2021   ALT 14 04/26/2021   AST 11 04/26/2021   NA 141 04/26/2021   K 4.5 04/26/2021   CL 102 04/26/2021   CREATININE 0.92 04/26/2021   BUN 14 04/26/2021   CO2 34 (H) 04/26/2021   TSH 1.48 02/09/2021   INR 1.0 04/01/2020   HGBA1C 6.1 (H) 11/18/2014  Lab Results  Component Value Date   TSH 1.48 02/09/2021   Lab Results  Component Value Date   WBC 14.7 (H) 04/26/2021   HGB 11.4 (L) 04/26/2021   HCT 34.7 (L) 04/26/2021   MCV 80.4 04/26/2021   PLT 360.0 04/26/2021   Lab Results  Component Value Date   NA 141 04/26/2021   K 4.5 04/26/2021   CO2 34 (H) 04/26/2021   GLUCOSE 95 04/26/2021   BUN 14 04/26/2021   CREATININE 0.92 04/26/2021   BILITOT 0.4 04/26/2021   ALKPHOS 77 04/26/2021   AST 11 04/26/2021   ALT 14 04/26/2021   PROT 5.3 (L) 04/26/2021   ALBUMIN 3.5 04/26/2021   CALCIUM 9.6 04/26/2021   ANIONGAP 11 04/19/2021   GFR 58.06 (L) 04/26/2021   Lab Results  Component Value Date   CHOL 222 (H) 01/04/2021   Lab Results  Component Value Date   HDL 68.30 01/04/2021   Lab Results  Component Value Date   LDLCALC 125 (H) 01/04/2021   Lab Results   Component Value Date   TRIG 146.0 01/04/2021   Lab Results  Component Value Date   CHOLHDL 3 01/04/2021   Lab Results  Component Value Date   HGBA1C 6.1 (H) 11/18/2014       Assessment & Plan:   Problem List Items Addressed This Visit       Unprioritized   URINARY INCONTINENCE   Relevant Orders   POCT Urinalysis Dipstick (Automated) (Completed)   Cough - Primary   Relevant Medications   promethazine-dextromethorphan (PROMETHAZINE-DM) 6.25-15 MG/5ML syrup   Other Visit Diagnoses     Pneumonia of both lower lobes due to infectious organism       Relevant Medications   promethazine-dextromethorphan (PROMETHAZINE-DM) 6.25-15 MG/5ML syrup   clarithromycin (BIAXIN XL) 500 MG 24 hr tablet   Other Relevant Orders   CBC with Differential/Platelet (Completed)   Comprehensive metabolic panel (Completed)        Meds ordered this encounter  Medications   promethazine-dextromethorphan (PROMETHAZINE-DM) 6.25-15 MG/5ML syrup    Sig: Take 5 mLs by mouth 4 (four) times daily as needed.    Dispense:  118 mL    Refill:  0   clarithromycin (BIAXIN XL) 500 MG 24 hr tablet    Sig: Take 2 tablets (1,000 mg total) by mouth daily.    Dispense:  14 tablet    Refill:  0   DISCONTD: tolterodine (DETROL LA) 4 MG 24 hr capsule    Sig: Take 1 capsule (4 mg total) by mouth daily.    Dispense:  30 capsule    Refill:  2    I, Amanda Held, DO, personally preformed the services described in this documentation.  All medical record entries made by the scribe were at my direction and in my presence.  I have reviewed the chart and discharge instructions (if applicable) and agree that the record reflects my personal performance and is accurate and complete. 04/26/2021   I,Amanda [name],acting as a scribe for Amanda Held, DO.,have documented all relevant documentation on the behalf of Amanda Held, DO,as directed by  Amanda Held, DO while in the presence of  Amanda Held, DO.   Amanda Held, DO

## 2021-04-27 ENCOUNTER — Encounter: Payer: Self-pay | Admitting: Hematology & Oncology

## 2021-04-27 ENCOUNTER — Other Ambulatory Visit: Payer: Self-pay | Admitting: Family Medicine

## 2021-04-27 DIAGNOSIS — H53413 Scotoma involving central area, bilateral: Secondary | ICD-10-CM | POA: Diagnosis not present

## 2021-04-27 LAB — COMPREHENSIVE METABOLIC PANEL
ALT: 14 U/L (ref 0–35)
AST: 11 U/L (ref 0–37)
Albumin: 3.5 g/dL (ref 3.5–5.2)
Alkaline Phosphatase: 77 U/L (ref 39–117)
BUN: 14 mg/dL (ref 6–23)
CO2: 34 mEq/L — ABNORMAL HIGH (ref 19–32)
Calcium: 9.6 mg/dL (ref 8.4–10.5)
Chloride: 102 mEq/L (ref 96–112)
Creatinine, Ser: 0.92 mg/dL (ref 0.40–1.20)
GFR: 58.06 mL/min — ABNORMAL LOW (ref >60.00)
Glucose, Bld: 95 mg/dL (ref 70–99)
Potassium: 4.5 mEq/L (ref 3.5–5.1)
Sodium: 141 mEq/L (ref 135–145)
Total Bilirubin: 0.4 mg/dL (ref 0.2–1.2)
Total Protein: 5.3 g/dL — ABNORMAL LOW (ref 6.0–8.3)

## 2021-04-27 LAB — CBC WITH DIFFERENTIAL/PLATELET
Basophils Absolute: 0.1 10*3/uL (ref 0.0–0.1)
Basophils Relative: 1 % (ref 0.0–3.0)
Eosinophils Absolute: 0.3 10*3/uL (ref 0.0–0.7)
Eosinophils Relative: 1.7 % (ref 0.0–5.0)
HCT: 34.7 % — ABNORMAL LOW (ref 36.0–46.0)
Hemoglobin: 11.4 g/dL — ABNORMAL LOW (ref 12.0–15.0)
Lymphocytes Relative: 7.6 % — ABNORMAL LOW (ref 12.0–46.0)
Lymphs Abs: 1.1 10*3/uL (ref 0.7–4.0)
MCHC: 32.9 g/dL (ref 30.0–36.0)
MCV: 80.4 fl (ref 78.0–100.0)
Monocytes Absolute: 1.2 10*3/uL — ABNORMAL HIGH (ref 0.1–1.0)
Monocytes Relative: 7.9 % (ref 3.0–12.0)
Neutro Abs: 12 10*3/uL — ABNORMAL HIGH (ref 1.4–7.7)
Neutrophils Relative %: 81.8 % — ABNORMAL HIGH (ref 43.0–77.0)
Platelets: 360 10*3/uL (ref 150.0–400.0)
RBC: 4.32 Mil/uL (ref 3.87–5.11)
RDW: 16.2 % — ABNORMAL HIGH (ref 11.5–15.5)
WBC: 14.7 10*3/uL — ABNORMAL HIGH (ref 4.0–10.5)

## 2021-04-27 MED ORDER — OXYBUTYNIN CHLORIDE ER 10 MG PO TB24: 10.0000 mg | ORAL_TABLET | Freq: Every day | ORAL | 2 refills | Status: DC

## 2021-04-30 DIAGNOSIS — Z20822 Contact with and (suspected) exposure to covid-19: Secondary | ICD-10-CM | POA: Diagnosis not present

## 2021-05-01 ENCOUNTER — Ambulatory Visit (INDEPENDENT_AMBULATORY_CARE_PROVIDER_SITE_OTHER): Payer: Medicare Other | Admitting: Pharmacist

## 2021-05-01 DIAGNOSIS — I10 Essential (primary) hypertension: Secondary | ICD-10-CM | POA: Diagnosis not present

## 2021-05-01 DIAGNOSIS — M810 Age-related osteoporosis without current pathological fracture: Secondary | ICD-10-CM

## 2021-05-01 DIAGNOSIS — R0602 Shortness of breath: Secondary | ICD-10-CM

## 2021-05-01 DIAGNOSIS — N393 Stress incontinence (female) (male): Secondary | ICD-10-CM

## 2021-05-01 DIAGNOSIS — E785 Hyperlipidemia, unspecified: Secondary | ICD-10-CM

## 2021-05-04 NOTE — Patient Instructions (Signed)
Amanda [name] It was a pleasure speaking with you today.  I have attached a summary of our visit today and information about your health goals.  (See Below)   If you have any questions or concerns, please feel free to contact me either at the phone number below or with a MyChart message.   Keep up the good work!  Cherre Robins, PharmD Clinical Pharmacist [facility] [phone] (direct line)  [phone] (main office number)  CARE PLAN ENTRY (see longitudinal plan of care for additional care plan information)  Current Barriers:  Chronic Disease Management support, education, and care coordination needs related to Hypertension, Hyperlipidemia, Pre-Diabetes, Hypothyroidism, Depression, GERD, Osteopenia   Hypertension BP Readings from Last 3 Encounters:  04/26/21 122/80  04/25/21 (!) 144/59  04/19/21 133/66   Pharmacist Clinical Goal(s): Over the next 90 days, patient will work with PharmD and providers to maintain BP goal <140/90 Current regimen:  Amlodipine $RemoveBef'5mg'oJSJiKvLto$  daily at breakfast Interventions: Requested patient to check blood pressure weekly and record Discussed effects of uncontrolled pain on blood pressure Patient self care activities - Over the next 90 days, patient will: Check blood pressure weekly, document, and provide at future appointments Ensure daily salt intake < 2300 mg/day  Hyperlipidemia Lab Results  Component Value Date/Time   LDLCALC 125 (H) 01/04/2021 08:55 AM   LDLCALC 84 12/07/2019 02:29 PM   LDLDIRECT 123.0 04/27/2020 02:01 PM   Pharmacist Clinical Goal(s): Over the next 90 days, patient will work with PharmD and providers to achieve LDL goal < 100 Current regimen:  Diet and exercise management   Interventions: Discussed diet and exercise Also discussed benefits of statin therapy - patient declines Patient self care activities - Over the next 90 days, patient will: Achieve LDL less than 100 Work on lowering  intake of foods high in saturated or trans fat.   Pre-Diabetes Lab Results  Component Value Date/Time   HGBA1C 6.1 (H) 11/18/2014 12:57 AM   HGBA1C 5.8 11/29/2011 03:51 PM   Pharmacist Clinical Goal(s): Over the next 90 days, patient will work with PharmD and providers to maintain A1c goal <6.5% Current regimen:  Diet and exercise management   Interventions: Discussed diet and exercise Consider repeat a1c at next office visit Patient self care activities - Over the next 90 days, patient will: Maintain a1c <6.5% Consider rechecking A1c with next labs  GERD Pharmacist Clinical Goal(s) Over the next 90 days, patient will work with PharmD and providers to reduce symptoms associated with GERD and decrease polypharmacy Current regimen:  Pantoprazole $RemoveBefor'40mg'gSwoQgPoeHUw$  daily  Interventions:  none Patient self care activities - Over the next 90 days, patient will: Continue to take pantoprazole $RemoveBeforeDEI'40mg'JpRTallforkYOGej$  daily  Osteoporosis Pharmacist Clinical Goal(s) Over the next 180 days, patient will work with PharmD and providers to reduce risk of fracture associated with osteoporosis Current regimen:  Vitamin D gummies - 2000 IU daily Interventions: Discussed ways to increase calcium intake through diet   Patient self care activities - Over the next 180 days, patient will: Continue 2000 units of vitamin D through supplementation   Shortness of Breath: Patient is currently seeing Dr Vaughan Browner for work up of shortness of breath. Will have PFTs done 06/04/2021. Current therapy:  Fluticasone/salmeterol (Advair HFA) 115/21 mcg - inhaler 2 puffs into the lungs twice a day Albuterol HFA inhaler - inhaler 2 puffs into the lungs every 6 hours as needed for shortness of breath or wheezing.  Flutter device Mucinex Interventions:  Reviewed 2023 formulary for  her [employer] extra Rx plan. She has a $100 deductible to meet for any Tier 3 or higher medications. Once met, Flovent, fluticasone / salmeterol (Adviar) or Breo cost  will be $45/30 days until she reached coverage gap. If for 90 day supply cost if $135 until reaches coverage gap.  Rinse mouth after each use of fluticasone/salmeterol (Advair)  Patient to discuss options with Dr Vaughan Browner after testing. Will follow up and assist patient assistance program if needed   Overactive Bladder:  Uncontrolled; goal: decreased urinary urgency, leakage and frequency Current therapy:  Oxybutynin ER 85m at bedtime (hasn't started yet due to concerns with potential side effects) Was prescribed tolterodine LA 465mbut cost was $200 (per her [place] is tier 4 or copay if 50% of medication cost. Interventions:  Discussed potential side effects of treatment options for overactive bladder.  Reviewed patient's formulary:  Oxybutynin is tier 2 - $10 per 30 days but patient is concerned about side effects Solifenasin (generic Vesicare) and Myrbetriq are both tier 3 - $45/30 days or $135 / 90 days. Patient is considering Myrbetriq but asked for medication information be sent to her to review. Information emailed to patient.  Will follow up with patient in 1 month (unless she calls back sooner)  Medication management Pharmacist Clinical Goal(s): Over the next 90 days, patient will work with PharmD and providers to maintain optimal medication adherence Current pharmacy: [facility] Interventions Comprehensive medication review performed. Continue current medication management strategy Patient self care activities - Over the next 90 days, patient will: Focus on medication adherence by filling and taking medications appropriately  Take medications as prescribed Report any questions or concerns to PharmD and/or provider(s)  Patient Goals/Self-Care Activities Over the next 180 days, patient will:  take medications as prescribed and  check blood pressure once per week, document, and provide at future appointments Reviewed information about Myrbetriq. Called our office if  you would like to try Myrbetriq in place of oxybutynin.  (TLynelle Smokeckard [phone] information Follow up with pulmonology to complete testing scheduled for 06/04/2021  Patient verbalizes understanding of instructions and care plan provided today and agrees to view in [facility] My[facility] status confirmed with patient.
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
## 2021-05-15 DIAGNOSIS — H53413 Scotoma involving central area, bilateral: Secondary | ICD-10-CM | POA: Diagnosis not present

## 2021-05-15 DIAGNOSIS — Z20822 Contact with and (suspected) exposure to covid-19: Secondary | ICD-10-CM | POA: Diagnosis not present

## 2021-05-17 DIAGNOSIS — M47816 Spondylosis without myelopathy or radiculopathy, lumbar region: Secondary | ICD-10-CM | POA: Diagnosis not present

## 2021-05-25 ENCOUNTER — Telehealth: Payer: Self-pay | Admitting: Family

## 2021-05-25 NOTE — Telephone Encounter (Signed)
-----   Message from Marrian Salvage, [place] sent at 04/19/2021 11:39 AM EST ----- ?Needs repeat CXR;  ? ?

## 2021-05-25 NOTE — Telephone Encounter (Signed)
-----   Message from Marrian Salvage, [place] sent at 04/19/2021 11:39 AM EST ----- ?Needs repeat CXR;  ? ?

## 2021-05-25 NOTE — Telephone Encounter (Signed)
I have called the pt informed her that she is seeing the the pulmonology and they will do the chest x-ray if it is needed. ?

## 2021-05-25 NOTE — Telephone Encounter (Signed)
We had talked in February about doing repeat CXR in about 6 weeks. I am happy to order this for her but it is also okay if she just wants to wait and discuss with pulmonology at appointment in April; need to know what she is most comfortable doing.  ?

## 2021-05-29 DIAGNOSIS — M47816 Spondylosis without myelopathy or radiculopathy, lumbar region: Secondary | ICD-10-CM | POA: Diagnosis not present

## 2021-06-04 ENCOUNTER — Ambulatory Visit: Payer: 59 | Admitting: Pulmonary Disease

## 2021-06-04 ENCOUNTER — Ambulatory Visit (INDEPENDENT_AMBULATORY_CARE_PROVIDER_SITE_OTHER): Payer: Medicare Other | Admitting: Pulmonary Disease

## 2021-06-04 ENCOUNTER — Encounter: Payer: Self-pay | Admitting: Pulmonary Disease

## 2021-06-04 VITALS — BP 140/76 | HR 68 | Temp 97.8°F | Ht 60.0 in | Wt 145.0 lb

## 2021-06-04 DIAGNOSIS — J479 Bronchiectasis, uncomplicated: Secondary | ICD-10-CM

## 2021-06-04 DIAGNOSIS — R0602 Shortness of breath: Secondary | ICD-10-CM | POA: Diagnosis not present

## 2021-06-04 LAB — PULMONARY FUNCTION TEST
DL/VA % pred: 115 %
DL/VA: 4.83 ml/min/mmHg/L
DLCO cor % pred: 82 %
DLCO cor: 13.34 ml/min/mmHg
DLCO unc % pred: 76 %
DLCO unc: 12.45 ml/min/mmHg
FEF 25-75 Post: 1.92 L/sec
FEF 25-75 Pre: 1.89 L/sec
FEF2575-%Change-Post: 1 %
FEF2575-%Pred-Post: 177 %
FEF2575-%Pred-Pre: 174 %
FEV1-%Change-Post: 0 %
FEV1-%Pred-Post: 91 %
FEV1-%Pred-Pre: 90 %
FEV1-Post: 1.38 L
FEV1-Pre: 1.36 L
FEV1FVC-%Change-Post: 2 %
FEV1FVC-%Pred-Pre: 122 %
FEV6-%Change-Post: -1 %
FEV6-%Pred-Post: 78 %
FEV6-%Pred-Pre: 79 %
FEV6-Post: 1.5 L
FEV6-Pre: 1.52 L
FEV6FVC-%Pred-Post: 106 %
FEV6FVC-%Pred-Pre: 106 %
FVC-%Change-Post: -1 %
FVC-%Pred-Post: 73 %
FVC-%Pred-Pre: 74 %
FVC-Post: 1.5 L
FVC-Pre: 1.52 L
Post FEV1/FVC ratio: 92 %
Post FEV6/FVC ratio: 100 %
Pre FEV1/FVC ratio: 90 %
Pre FEV6/FVC Ratio: 100 %
RV % pred: 72 %
RV: 1.61 L
TLC % pred: 74 %
TLC: 3.3 L

## 2021-06-04 NOTE — Patient Instructions (Signed)
I have reviewed your lung function test which shows minimal change in your lung capacity.  Overall I think there is no cause for concern ?Continue to use the Mucinex and flutter valve to help with clearance of secretions ? ?Follow-up in 1 year ?

## 2021-06-04 NOTE — Patient Instructions (Signed)
Full PFT performed today. °

## 2021-06-04 NOTE — Progress Notes (Signed)
? ?      ?Amanda [name]    [id number]    [birth year] ? ?Primary Care Physician:Amanda Koren Shiver, DO ? ?Referring Physician: Carollee [name], Amanda Apa, DO ?[address] ?[address] ?[place],  [place] [address] ? ?Chief complaint: Follow-up for dyspnea ? ?HPI: ?81 year old with history of hypertension, lymphoma, right mid lobe bronchiectasis complains of chronic dyspnea on exertion with intermittent mucus production.  She had scan in December 2022 for follow-up of lymphoma which showed right middle lobe consolidation for which she got 2 rounds of antibiotics.  She has history of focal right middle lobe bronchiectasis. ? ?Pets: Dog ?Occupation: Retired Sports administrator ?Exposures: No known exposures.  No mold, hot tub, Jacuzzi.  No feather pillows or comforters ?Smoking history: Never smoker ?Travel history: Previously lived in [place], [place].  No significant recent travel ?Relevant family history: No family history of lung disease ? ? ?Outpatient Encounter Medications as of 06/04/2021  ?Medication Sig  ? acetaminophen (TYLENOL) 325 MG tablet Take 1-2 tablets (325-650 mg total) by mouth every 8 (eight) hours as needed for mild pain.  ? albuterol (VENTOLIN HFA) 108 (90 Base) MCG/ACT inhaler INHALE 2 PUFFS INTO THE LUNGS EVERY 6 HOURS AS NEEDED FOR WHEEZING OR SHORTNESS OF BREATH  ? amLODipine (NORVASC) 10 MG tablet Take 1 tablet (10 mg total) by mouth daily.  ? aspirin 81 MG chewable tablet Chew 1 tablet (81 mg total) by mouth daily.  ? buPROPion (WELLBUTRIN XL) 150 MG 24 hr tablet TAKE 1 TABLET(150 MG) BY MOUTH DAILY  ? Cholecalciferol (VITAMIN D3 ADULT GUMMIES) 25 MCG (1000 UT) CHEW Chew 2 each by mouth daily.  ? fluticasone-salmeterol (ADVAIR HFA) 115-21 MCG/ACT inhaler Inhale 2 puffs into the lungs 2 (two) times daily.  ? levothyroxine (SYNTHROID) 125 MCG tablet TAKE 1 TABLET(125 MCG) BY MOUTH DAILY BEFORE AND BREAKFAST  ? pantoprazole (PROTONIX) 40 MG tablet Take 40 mg by mouth daily.  ? senna-docusate  (SENOKOT-S) 8.6-50 MG tablet Take 2 tablets by mouth at bedtime.  ? clarithromycin (BIAXIN XL) 500 MG 24 hr tablet Take 2 tablets (1,000 mg total) by mouth daily.  ? promethazine-dextromethorphan (PROMETHAZINE-DM) 6.25-15 MG/5ML syrup Take 5 mLs by mouth 4 (four) times daily as needed.  ? [DISCONTINUED] oxybutynin (DITROPAN XL) 10 MG 24 hr tablet Take 1 tablet (10 mg total) by mouth at bedtime. (Patient not taking: Reported on 05/01/2021)  ? ?Facility-Administered Encounter Medications as of 06/04/2021  ?Medication  ? diphenhydrAMINE (BENADRYL) capsule 50 mg  ? heparin lock flush 100 unit/mL  ? sodium chloride flush (NS) 0.9 % injection 10 mL  ? ?Physical Exam: ?Blood pressure 130/74, pulse 71, temperature 97.8 ?F (36.6 ?C), temperature source Oral, height 5' (1.524 m), weight 145 lb (65.8 kg), SpO2 98 %. ?Gen:      No acute distress ?HEENT:  EOMI, sclera anicteric ?Neck:     No masses; no thyromegaly ?Lungs:    Clear to auscultation bilaterally; normal respiratory effort ?CV:         Regular rate and rhythm; no murmurs ?Abd:      + bowel sounds; soft, non-tender; no palpable masses, no distension ?Ext:    No edema; adequate peripheral perfusion ?Skin:      Warm and dry; no rash ?Neuro: alert and oriented x 3 ?Psych: normal mood and affect ? ?Data Reviewed: ?Imaging: ?CT chest 01/21/2021-pleural thickening, atelectasis in the lower lobe, mild scarring and bronchiectasis over the right base and medial right middle lobe ? ?  PET scan 02/16/2021-volume loss in the right middle lobe with bronchiectasis and groundglass changes.   ? ?Chest x-ray 04/17/2021-mild hyperinflation, improving bibasal infiltrate ? ?PFTs: ?06/04/21 ?1.50 [73%], FEV1 1.38 [91%], F/F 92, TLC 3.30 [94%], DLCO 12.45 [76%] ?Minimal restriction ? ?Labs: ?WBC 11.3, hemoglobin 11.8, platelets 493 ?Quantitative immunoglobulins 06/05/2018-IgE less than 2, IgG 521, IgA 57 ? ?Assessment:  ?Dyspnea, right middle lobe low bronchiectasis ?Bronchiectasis may be post  inflammatory from prior pneumonia.  As she has history of recurrent pneumonia this raises suspicion for right middle lobe syndrome.  Currently she is not making enough sputum to test for AFB ?PFTs reviewed with minimal restriction and no obstruction ? ?She is concerned about diagnosis of COPD given on chest x-ray in February.  I have reassured her that there is no evidence of this ?Use Mucinex and flutter valve to help with mucociliary clearance. ? ?Plan/Recommendations: ?Flutter valve, Mucinex ?Follow-up in 1 year ? ?Marshell Garfinkel MD ?[facility] ?06/04/2021, 3:17 PM ? ?CC: Amanda [name], * ? ?  ?

## 2021-06-04 NOTE — Progress Notes (Signed)
Full PFT performed today. °

## 2021-06-05 ENCOUNTER — Ambulatory Visit (INDEPENDENT_AMBULATORY_CARE_PROVIDER_SITE_OTHER): Payer: Medicare Other | Admitting: Pharmacist

## 2021-06-05 DIAGNOSIS — M810 Age-related osteoporosis without current pathological fracture: Secondary | ICD-10-CM

## 2021-06-05 DIAGNOSIS — R0602 Shortness of breath: Secondary | ICD-10-CM

## 2021-06-05 DIAGNOSIS — I10 Essential (primary) hypertension: Secondary | ICD-10-CM

## 2021-06-05 NOTE — Chronic Care Management (AMB) (Signed)
? ? ?Chronic Care Management ?Pharmacy Note ? ?06/05/2021 ?Name:  Amanda [name] MRN:  [id number] DOB:  06-Apr-1940 ? ?Summary:  ?Checking with Dr Vaughan Browner about continuing Advair. Message sent today.  ?Patient declines treatment for osteoporosis or OAB>   ? ?Subjective: ?Amanda [name] is an 81 y.o. year old female who is a primary patient of [name], DO.  The CCM team was consulted for assistance with disease management and care coordination needs.   ? ?Engaged with patient by telephone for follow up visit in response to provider referral for pharmacy case management and/or care coordination services.  ? ?Consent to Services:  ?The patient was given information about Chronic Care Management services, agreed to services, and gave verbal consent prior to initiation of services.  Please see initial visit note for detailed documentation.  ? ?Patient Care Team: ?Carollee Herter, Alferd Apa, DO as PCP - General (Family Medicine) ?Jari Pigg, MD as Consulting Physician (Dermatology) ?Monna Fam, MD as Consulting Physician (Ophthalmology) ?Terrance Mass, MD (Inactive) as Consulting Physician (Gynecology) ?Volanda Napoleon, MD as Medical Oncologist (Oncology) ?[name], DO (Family Medicine) ?Meredith Staggers, MD as Consulting Physician (Physical Medicine and Rehabilitation) ?[name], MD as Consulting Physician (Neurosurgery) ?Cherre Robins, RPH-CPP (Pharmacist) ? ?Recent office visits: ?04/26/2021 - Fam Med (Dr Carollee Herter) seen for cough. Prescribed clarithromycin 1065m daily; promethazine DM cough syrup and tolterodine 43mdaily for OAB. ?02/09/2021 - Fam Med (Dr LoCarollee HerterFollow up fatigue, pneumonia and SOB. Prescribed fluticasone HFA 11036m- 2 puffs twice a day. Referred to pulmonology  ?01/29/2021 - Fam Med (Dr LowCarollee Herterhone message - requested cough syrup.Presribed promethazine + Dextromethorphan syrup.  ?01/23/2021 - Fam Med (Dr LowCarollee Herter/U community acquired  pnemonia. Prescribed albuterol, azithromycin, benxonatate and prednisone for 12 days.  ?01/04/2021 - Fam Med (Dr LowCarollee Herter/U chronic conditions.  ?12/14/2020 [place] [facility] [place] / acute bronchitis. Prescribed doxycycline100m67mice a day for 7 days and prednisone 20mg5mly for 5 days.   ? ?Recent consult visits: ?06/04/2021 - Pulmonary (Dr MannaVaughan Browner dyspnea. PFTs performed. Showed minimal restriction and no obstruction. Recommended use Mucinex and flutter device.  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 IgG level is down a little bit.  If the IgG level is down lower, considering IVIG.  Iron saturation is only 7%.  Ordered  IV iron.   ?03/12/2021 - Pulmonology (Dr MannaVaughan Brownern for dyspnea wiht right middle lobe low bronchiectasis. May be post inflammatory from prior pneumonia.  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  Currently not making enough sputum to test for AFB ?Ordered PFTs for better evaluation of the lung. Recommended Mucinex and flutter valve to help with mucociliary clearance. ? ?H[facility] visits: ?[facility] Prescribed amoxicillin + clavulanate 875/125mg 16m for 7 days. ? ?Objective: ? ?Lab Results  ?Component Value Date  ? CREATININE 0.92 04/26/2021  ? CREATININE 0.94 04/19/2021  ? CREATININE 1.09 (H) 02/09/2021  ? ? ?Lab Results  ?Component Value Date  ? HGBA1C 6.1 (H) 11/18/2014  ? ?Last diabetic Eye exam:  ?Lab Results  ?Component Value Date/Time  ? HMDIABEYEEXA Retinopathy (A) 06/23/2020 12:00 AM  ?  ?Last diabetic Foot exam: No results found  for: HMDIABFOOTEX  ? ?   ?Component Value Date/Time  ? CHOL 222 (H) 01/04/2021 0855  ? TRIG 146.0 01/04/2021 0855   ? HDL 68.30 01/04/2021 0855  ? CHOLHDL 3 01/04/2021 0855  ? VLDL 29.2 01/04/2021 0855  ? [place] 125 (H) 01/04/2021 0855  ? [place] 84 12/07/2019 1429  ? LDLDIRECT 123.0 04/27/2020 1401  ? ? ? ?  Latest Ref Rng & Units 04/26/2021  ?  3:27 PM 04/19/2021  ?  1:38 PM 02/09/2021  ?  3:45 PM  ?Hepatic Function  ?Total Protein 6.0 - 8.3 g/dL 5.3   6.7   6.3    ?Albumin 3.5 - 5.2 g/dL 3.5   3.8     ?AST 0 - 37 U/L '11   13   12    ' ?ALT 0 - 35 U/L '14   14   9    ' ?Alk Phosphatase 39 - 117 U/L 77   87     ?Total Bilirubin 0.2 - 1.2 mg/dL 0.4   0.4   0.3    ? ? ?Lab Results  ?Component Value Date/Time  ? TSH 1.48 02/09/2021 03:45 PM  ? TSH 12.79 (H) 04/27/2020 02:01 PM  ? FREET4 0.74 03/02/2014 09:34 AM  ? ? ? ?  Latest Ref Rng & Units 04/26/2021  ?  3:27 PM 04/19/2021  ?  1:38 PM 02/09/2021  ?  3:45 PM  ?CBC  ?WBC 4.0 - 10.5 K/uL 14.7   13.7   11.3    ?Hemoglobin 12.0 - 15.0 g/dL 11.4   12.0   11.8    ?Hematocrit 36.0 - 46.0 % 34.7   36.7   37.2    ?Platelets 150.0 - 400.0 K/uL 360.0   274   493    ? ? ?Lab Results  ?Component Value Date/Time  ? VD25OH 20.0 (L) 06/18/2018 04:44 AM  ? VD25OH 32 06/02/2014 03:12 PM  ? ? ?Clinical ASCVD: No  ?The ASCVD Risk score (Arnett DK, et al., 2019) failed to calculate for the following reasons: ?  The 2019 ASCVD risk score is only valid for ages 15 to 38   ? ? ?Social History  ? ?Tobacco Use  ?Smoking Status Never  ?Smokeless Tobacco Never  ? ?BP Readings from Last 3 Encounters:  ?06/04/21 140/76  ?04/26/21 122/80  ?04/25/21 (!) 144/59  ? ?Pulse Readings from Last 3 Encounters:  ?06/04/21 68  ?04/26/21 83  ?04/25/21 74  ? ?Wt Readings from Last 3 Encounters:  ?06/04/21 145 lb (65.8 kg)  ?04/26/21 145 lb (65.8 kg)  ?04/19/21 144 lb 12.8 oz (65.7 kg)  ? ? ?Assessment: Review of patient past medical history, allergies, medications, health status, including review of consultants reports, laboratory and other test data, was performed as part of comprehensive evaluation and provision of chronic care  management services.  ? ?SDOH:  (Social Determinants of Health) assessments and interventions performed:  ? ? ? ?CCM Care Plan ? ?Allergies  ?Allergen Reactions  ? Phenergan [Promethazine Hcl] Other (See Comments)  ?  Jerking/agitation  ? Preservision Areds 2 [Multiple Vitamins-Minerals] Nausea Only  ? Levaquin [Levofloxacin] Nausea And Vomiting  ? Pravastatin Nausea And Vomiting  ? ? ?Medications Reviewed Today   ? ? Reviewed by Cherre Robins, RPH-CPP (Pharmacist) on 06/05/21 at 1532  Med List Status: <None>  ? ?Medication Order Taking? Sig Documenting Provider Last Dose Status Informant  ?acetaminophen (TYLENOL) 325 MG tablet [id number] Yes Take 1-2 tablets (325-650 mg total) by mouth every 8 (eight)  hours as needed for mild pain. Bary Leriche, PA-C Taking Active   ?albuterol (VENTOLIN HFA) 108 (90 Base) MCG/ACT inhaler [id number] Yes INHALE 2 PUFFS INTO THE LUNGS EVERY 6 HOURS AS NEEDED FOR WHEEZING OR SHORTNESS OF BREATH [name], DO Taking Active   ?amLODipine (NORVASC) 10 MG tablet [id number] Yes Take 1 tablet (10 mg total) by mouth daily. [name], DO Taking Active   ?aspirin 81 MG chewable tablet [id number] Yes Chew 1 tablet (81 mg total) by mouth daily. Cathlyn Parsons, PA-C Taking Active Self  ?buPROPion (WELLBUTRIN XL) 150 MG 24 hr tablet [id number] Yes TAKE 1 TABLET(150 MG) BY MOUTH DAILY [name], DO Taking Active   ?Cholecalciferol (VITAMIN D3 ADULT GUMMIES) 25 MCG (1000 UT) CHEW [id number] Yes Chew 2 each by mouth daily. [provider] Taking Active   ?fluticasone-salmeterol (ADVAIR HFA) 859-29 MCG/ACT inhaler [id number] Yes Inhale 2 puffs into the lungs 2 (two) times daily. Marrian Salvage, FNP Taking Active   ?levothyroxine (SYNTHROID) 125 MCG tablet [id number] Yes TAKE 1 TABLET(125 MCG) BY MOUTH DAILY BEFORE AND BREAKFAST [name], DO Taking Active   ?pantoprazole (PROTONIX) 40 MG tablet [id number] Yes Take 40 mg by mouth daily.  [provider] Taking Active   ?senna-docusate (SENOKOT-S) 8.6-50 MG tablet [id number] Yes Take 2 tablets by mouth at bedtime. Bary Leriche, PA-C Taking Active   ? ?  ?  ? ?  ? ? ?Patient Active Prob

## 2021-06-05 NOTE — Patient Instructions (Signed)
Mrs Massing,  ?It was a pleasure speaking with you  ?Below is a summary of your health goals and care plan ? ?If you have any questions or concerns, please feel free to contact me either at the phone number below or with a MyChart message.  ? ?Keep up the good work! ? ?Cherre Robins, PharmD ?Clinical Pharmacist ?[facility] ?[place] ?[phone] (direct line)  ? ? ?Chronic Care Management CARE PLAN  ?Hypertension ?BP Readings from Last 3 Encounters:  ?06/04/21 140/76  ?04/26/21 122/80  ?04/25/21 (!) 144/59  ? ?Pharmacist Clinical Goal(s): ?Over the next 90 days, patient will work with PharmD and providers to maintain BP goal <140/90 ?Current regimen:  ?Amlodipine '5mg'$  daily at breakfast ?Interventions: ?Requested patient to check blood pressure weekly and record ?Discussed effects of uncontrolled pain on blood pressure ?Patient self care activities - Over the next 90 days, patient will: ?Check blood pressure weekly, document, and provide at future appointments ?Ensure daily salt intake < 2300 mg/day ? ?Hyperlipidemia ?Lab Results  ?Component Value Date/Time  ? LDLCALC 125 (H) 01/04/2021 08:55 AM  ? LDLCALC 84 12/07/2019 02:29 PM  ? LDLDIRECT 123.0 04/27/2020 02:01 PM  ? ?Pharmacist Clinical Goal(s): ?Over the next 90 days, patient will work with PharmD and providers to achieve LDL goal < 100 ?Current regimen:  ?Diet and exercise management   ?Interventions: ?Discussed diet and exercise ?Also discussed benefits of statin therapy - patient declines ?Patient self care activities - Over the next 90 days, patient will: ?Achieve LDL less than 100 ?Work on lowering intake of foods high in saturated or trans fat.  ? ?Pre-Diabetes ?Lab Results  ?Component Value Date/Time  ? HGBA1C 6.1 (H) 11/18/2014 12:57 AM  ? HGBA1C 5.8 11/29/2011 03:51 PM  ? ?Pharmacist Clinical Goal(s): ?Over the next 90 days, patient will work with PharmD and providers to maintain A1c goal <6.5% ?Current regimen:  ?Diet and  exercise management   ?Interventions: ?Discussed diet and exercise ?Consider repeat a1c at next office visit ?Patient self care activities - Over the next 90 days, patient will: ?Maintain a1c <6.5% ?Consider rechecking A1c with next labs ? ?GERD ?Pharmacist Clinical Goal(s) ?Over the next 90 days, patient will work with PharmD and providers to reduce symptoms associated with GERD and decrease polypharmacy ?Current regimen:  ?Pantoprazole '40mg'$  daily  ?Interventions: ? none ?Patient self care activities - Over the next 90 days, patient will: ?Continue to take pantoprazole '40mg'$  daily ? ?Osteoporosis ?Pharmacist Clinical Goal(s) ?Over the next 180 days, patient will work with PharmD and providers to reduce risk of fracture associated with osteoporosis ?Current regimen:  ?Vitamin D gummies - 2000 IU daily ?Interventions: ?Discussed ways to increase calcium intake through diet   ?Patient self care activities - Over the next 180 days, patient will: ?Continue 2000 units of vitamin D through supplementation ? ? ?Shortness of Breath: ?Current therapy:  ?Fluticasone/salmeterol (Advair HFA) 115/21 mcg - inhaler 2 puffs into the lungs twice a day ?Albuterol HFA inhaler - inhaler 2 puffs into the lungs every 6 hours as needed for shortness of breath or wheezing.  ?Flutter device ?Mucinex ?Interventions:  ?Continue Advair for now - I am checking with Dr Vaughan Browner about his recommendation to continue Advair or not.  ?Rinse mouth after each use of fluticasone/salmeterol (Advair)  ? ?Overactive Bladder:  ?Uncontrolled; goal: decreased urinary urgency, leakage and frequency ?Current therapy:  ?none ?Interventions:  ?Discussed potential side effects of treatment options for overactive bladder.  ?Reviewed patient's formulary:  ?Oxybutynin  is tier 2 - $10 per 30 days but patient is concerned about side effects ?Solifenasin (generic Vesicare) and Myrbetriq are both tier 3 - $45/30 days or $135 / 90 days. Patient is considering Myrbetriq but  asked for medication information be sent to her to review. Information emailed to patient.  ? ?Medication management ?Pharmacist Clinical Goal(s): ?Over the next 90 days, patient will work with PharmD and providers to maintain optimal medication adherence ?Current pharmacy: [facility] ?Interventions ?Comprehensive medication review performed. ?Continue current medication management strategy ?Patient self care activities - Over the next 90 days, patient will: ?Focus on medication adherence by filling and taking medications appropriately  ?Take medications as prescribed ?Report any questions or concerns to PharmD and/or provider(s) ? ? ?Patient Goals/Self-Care Activities ?Over the next 180 days, patient will:  ?take medications as prescribed and  ?check blood pressure once per week, document, and provide at future appointments ? ?Patient verbalizes understanding of instructions and care plan provided today and agrees to view in [place]. Active MyChart status confirmed with patient.    ?

## 2021-06-06 DIAGNOSIS — M47816 Spondylosis without myelopathy or radiculopathy, lumbar region: Secondary | ICD-10-CM | POA: Diagnosis not present

## 2021-06-07 NOTE — Chronic Care Management (AMB) (Signed)
Per Dr Vaughan Browner - Recommends stopping Advair for now but if symptoms return or worsen patient is to restart.  ?Patient notified by MyChart message.  ? ?Marshell Garfinkel, MD  Amanda [name], Amanda [name] ?She can stop advair.  If symptoms worsen then we can resume.   ?  ?   ?Previous Messages ?  ?----- Message -----  ?From: Amanda [name], Amanda [name]  ?Sent: 06/05/2021   3:32 PM EDT  ?To: Marshell Garfinkel, MD  ?Subject: Advair inhaler                                ? ?Hi Dr Vaughan Browner,  ?Amanda [name] was not sure if she was to continue Advair or not after PFT results.  ?Please verify. I will be glad to notify patient of your recommendations.  ? ?Amanda [name], PharmD  ?Clinical Pharmacist  ?[facility]  ?[place]  ?[phone]  ?

## 2021-06-12 ENCOUNTER — Ambulatory Visit (INDEPENDENT_AMBULATORY_CARE_PROVIDER_SITE_OTHER): Payer: Medicare Other

## 2021-06-12 VITALS — Ht 60.0 in | Wt 145.0 lb

## 2021-06-12 DIAGNOSIS — L578 Other skin changes due to chronic exposure to nonionizing radiation: Secondary | ICD-10-CM | POA: Diagnosis not present

## 2021-06-12 DIAGNOSIS — D045 Carcinoma in situ of skin of trunk: Secondary | ICD-10-CM | POA: Diagnosis not present

## 2021-06-12 DIAGNOSIS — L820 Inflamed seborrheic keratosis: Secondary | ICD-10-CM | POA: Diagnosis not present

## 2021-06-12 DIAGNOSIS — Z85828 Personal history of other malignant neoplasm of skin: Secondary | ICD-10-CM | POA: Diagnosis not present

## 2021-06-12 DIAGNOSIS — Z86018 Personal history of other benign neoplasm: Secondary | ICD-10-CM | POA: Diagnosis not present

## 2021-06-12 DIAGNOSIS — D044 Carcinoma in situ of skin of scalp and neck: Secondary | ICD-10-CM | POA: Diagnosis not present

## 2021-06-12 DIAGNOSIS — D2272 Melanocytic nevi of left lower limb, including hip: Secondary | ICD-10-CM | POA: Diagnosis not present

## 2021-06-12 DIAGNOSIS — L821 Other seborrheic keratosis: Secondary | ICD-10-CM | POA: Diagnosis not present

## 2021-06-12 DIAGNOSIS — D485 Neoplasm of uncertain behavior of skin: Secondary | ICD-10-CM | POA: Diagnosis not present

## 2021-06-12 DIAGNOSIS — L650 Telogen effluvium: Secondary | ICD-10-CM | POA: Diagnosis not present

## 2021-06-12 DIAGNOSIS — L658 Other specified nonscarring hair loss: Secondary | ICD-10-CM | POA: Diagnosis not present

## 2021-06-12 DIAGNOSIS — L570 Actinic keratosis: Secondary | ICD-10-CM | POA: Diagnosis not present

## 2021-06-12 DIAGNOSIS — D2371 Other benign neoplasm of skin of right lower limb, including hip: Secondary | ICD-10-CM | POA: Diagnosis not present

## 2021-06-12 DIAGNOSIS — Z0000 Encounter for general adult medical examination without abnormal findings: Secondary | ICD-10-CM

## 2021-06-12 NOTE — Patient Instructions (Signed)
Amanda [name] , ?Thank you for taking time to complete your Medicare Wellness Visit. I appreciate your ongoing commitment to your health goals. Please review the following plan we discussed and let me know if I can assist you in the future.  ? ?Screening recommendations/referrals: ?Colonoscopy: No longer required ?Mammogram: Per our conversation, completed within the last year. Please have copy of report sent to Dr. Etter Sjogren. ?Bone Density: Completed 03/10/2020-Due 03/10/2022. ?Recommended yearly ophthalmology/optometry visit for glaucoma screening and checkup ?Recommended yearly dental visit for hygiene and checkup ? ?Vaccinations: ?Influenza vaccine: Up to date ?Pneumococcal vaccine: Up to date ?Tdap vaccine: Up to date ?Shingles vaccine: Due-May obtain vaccine at your local pharmacy. ?Covid-19:May obtain booster at your local pharmacy. ? ?Advanced directives: Copy in chart ? ?Conditions/risks identified: See problem list ? ?Next appointment: Follow up in one year for your annual wellness visit  ? ? ?Preventive Care 83 Years and Older, Female ?Preventive care refers to lifestyle choices and visits with your health care provider that can promote health and wellness. ?What does preventive care include? ?A yearly physical exam. This is also called an annual well check. ?Dental exams once or twice a year. ?Routine eye exams. Ask your health care provider how often you should have your eyes checked. ?Personal lifestyle choices, including: ?Daily care of your teeth and gums. ?Regular physical activity. ?Eating a healthy diet. ?Avoiding tobacco and drug use. ?Limiting alcohol use. ?Practicing safe sex. ?Taking low-dose aspirin every day. ?Taking vitamin and mineral supplements as recommended by your health care provider. ?What happens during an annual well check? ?The services and screenings done by your health care provider during your annual well check will depend on your age, overall health, lifestyle risk factors, and family  history of disease. ?Counseling  ?Your health care provider may ask you questions about your: ?Alcohol use. ?Tobacco use. ?Drug use. ?Emotional well-being. ?Home and relationship well-being. ?Sexual activity. ?Eating habits. ?History of falls. ?Memory and ability to understand (cognition). ?Work and work Statistician. ?Reproductive health. ?Screening  ?You may have the following tests or measurements: ?Height, weight, and BMI. ?Blood pressure. ?Lipid and cholesterol levels. These may be checked every 5 years, or more frequently if you are over 53 years old. ?Skin check. ?Lung cancer screening. You may have this screening every year starting at age 56 if you have a 30-pack-year history of smoking and currently smoke or have quit within the past 15 years. ?Fecal occult blood test (FOBT) of the stool. You may have this test every year starting at age 21. ?Flexible sigmoidoscopy or colonoscopy. You may have a sigmoidoscopy every 5 years or a colonoscopy every 10 years starting at age 76. ?Hepatitis C blood test. ?Hepatitis B blood test. ?Sexually transmitted disease (STD) testing. ?Diabetes screening. This is done by checking your blood sugar (glucose) after you have not eaten for a while (fasting). You may have this done every 1-3 years. ?Bone density scan. This is done to screen for osteoporosis. You may have this done starting at age 54. ?Mammogram. This may be done every 1-2 years. Talk to your health care provider about how often you should have regular mammograms. ?Talk with your health care provider about your test results, treatment options, and if necessary, the need for more tests. ?Vaccines  ?Your health care provider may recommend certain vaccines, such as: ?Influenza vaccine. This is recommended every year. ?Tetanus, diphtheria, and acellular pertussis (Tdap, Td) vaccine. You may need a Td booster every 10 years. ?Zoster vaccine. You may need  this after age 27. ?Pneumococcal 13-valent conjugate (PCV13)  vaccine. One dose is recommended after age 37. ?Pneumococcal polysaccharide (PPSV23) vaccine. One dose is recommended after age 21. ?Talk to your health care provider about which screenings and vaccines you need and how often you need them. ?This information is not intended to replace advice given to you by your health care provider. Make sure you discuss any questions you have with your health care provider. ?Document Released: 03/17/2015 Document Revised: 11/08/2015 Document Reviewed: 12/20/2014 ?Elsevier Interactive Patient Education ? 2017 [place]. ? ?Fall Prevention in the Home ?Falls can cause injuries. They can happen to people of all ages. There are many things you can do to make your home safe and to help prevent falls. ?What can I do on the outside of my home? ?Regularly fix the edges of walkways and driveways and fix any cracks. ?Remove anything that might make you trip as you walk through a door, such as a raised step or threshold. ?Trim any bushes or trees on the path to your home. ?Use bright outdoor lighting. ?Clear any walking paths of anything that might make someone trip, such as rocks or tools. ?Regularly check to see if handrails are loose or broken. Make sure that both sides of any steps have handrails. ?Any raised decks and porches should have guardrails on the edges. ?Have any leaves, snow, or ice cleared regularly. ?Use sand or salt on walking paths during winter. ?Clean up any spills in your garage right away. This includes oil or grease spills. ?What can I do in the bathroom? ?Use night lights. ?Install grab bars by the toilet and in the tub and shower. Do not use towel bars as grab bars. ?Use non-skid mats or decals in the tub or shower. ?If you need to sit down in the shower, use a plastic, non-slip stool. ?Keep the floor dry. Clean up any water that spills on the floor as soon as it happens. ?Remove soap buildup in the tub or shower regularly. ?Attach bath mats securely with  double-sided non-slip rug tape. ?Do not have throw rugs and other things on the floor that can make you trip. ?What can I do in the bedroom? ?Use night lights. ?Make sure that you have a light by your bed that is easy to reach. ?Do not use any sheets or blankets that are too big for your bed. They should not hang down onto the floor. ?Have a firm chair that has side arms. You can use this for support while you get dressed. ?Do not have throw rugs and other things on the floor that can make you trip. ?What can I do in the kitchen? ?Clean up any spills right away. ?Avoid walking on wet floors. ?Keep items that you use a lot in easy-to-reach places. ?If you need to reach something above you, use a strong step stool that has a grab bar. ?Keep electrical cords out of the way. ?Do not use floor polish or wax that makes floors slippery. If you must use wax, use non-skid floor wax. ?Do not have throw rugs and other things on the floor that can make you trip. ?What can I do with my stairs? ?Do not leave any items on the stairs. ?Make sure that there are handrails on both sides of the stairs and use them. Fix handrails that are broken or loose. Make sure that handrails are as long as the stairways. ?Check any carpeting to make sure that it is firmly attached to  the stairs. Fix any carpet that is loose or worn. ?Avoid having throw rugs at the top or bottom of the stairs. If you do have throw rugs, attach them to the floor with carpet tape. ?Make sure that you have a light switch at the top of the stairs and the bottom of the stairs. If you do not have them, ask someone to add them for you. ?What else can I do to help prevent falls? ?Wear shoes that: ?Do not have high heels. ?Have rubber bottoms. ?Are comfortable and fit you well. ?Are closed at the toe. Do not wear sandals. ?If you use a stepladder: ?Make sure that it is fully opened. Do not climb a closed stepladder. ?Make sure that both sides of the stepladder are locked  into place. ?Ask someone to hold it for you, if possible. ?Clearly mark and make sure that you can see: ?Any grab bars or handrails. ?First and last steps. ?Where the edge of each step is. ?Use tools that help you mov

## 2021-06-12 NOTE — Progress Notes (Signed)
? ?Subjective:  ? Amanda [name] is a 81 y.o. female who presents for Medicare Annual (Subsequent) preventive examination. ? ?I connected with [name] today by telephone and verified that I am speaking with the correct person using two identifiers. ?Location patient: home ?Location provider: work ?Persons participating in the virtual visit: patient, nurse.  ?  ?I discussed the limitations, risks, security and privacy concerns of performing an evaluation and management service by telephone and the availability of in person appointments. I also discussed with the patient that there may be a patient responsible charge related to this service. The patient expressed understanding and verbally consented to this telephonic visit.  ?  ?Interactive audio and video telecommunications were attempted between this provider and patient, however failed, due to patient having technical difficulties OR patient did not have access to video capability.  We continued and completed visit with audio only. ? ?Some vital signs may be absent or patient reported.  ? ?Time Spent with patient on telephone encounter: 30 minutes ? ? ?Review of Systems    ? ?Cardiac Risk Factors include: advanced age (>17mn, >>51women);dyslipidemia;hypertension;sedentary lifestyle ? ?   ?Objective:  ?  ?Today's Vitals  ? 06/12/21 1231  ?Weight: 145 lb (65.8 kg)  ?Height: 5' (1.524 m)  ? ?Body mass index is 28.32 kg/m?. ? ? ?  06/12/2021  ? 12:37 PM 04/19/2021  ?  2:01 PM 04/11/2021  ?  1:06 PM 01/21/2021  ?  1:19 PM 12/05/2020  ?  2:35 PM 11/29/2020  ?  9:27 AM 08/29/2020  ? 11:10 AM  ?Advanced Directives  ?Does Patient Have a Medical Advance Directive? Yes Yes Yes Yes Yes Yes Yes  ?Type of [place]of [place] will [place] City[place] will   [place]Living will [place]ast GalesburgLiving will Living will;Healthcare Power of Attorney  ?Does patient want to make changes to medical advance  directive?  No - Patient declined No - Patient declined  No - Patient declined No - Patient declined No - Patient declined  ?Copy of [place] Chart? Yes - validated most recent copy scanned in chart (See row information) No - copy requested    No - copy requested   ? ? ?Current Medications (verified) ?Outpatient Encounter Medications as of 06/12/2021  ?Medication Sig  ? acetaminophen (TYLENOL) 325 MG tablet Take 1-2 tablets (325-650 mg total) by mouth every 8 (eight) hours as needed for mild pain.  ? albuterol (VENTOLIN HFA) 108 (90 Base) MCG/ACT inhaler INHALE 2 PUFFS INTO THE LUNGS EVERY 6 HOURS AS NEEDED FOR WHEEZING OR SHORTNESS OF BREATH  ? amLODipine (NORVASC) 10 MG tablet Take 1 tablet (10 mg total) by mouth daily.  ? aspirin 81 MG chewable tablet Chew 1 tablet (81 mg total) by mouth daily.  ? buPROPion (WELLBUTRIN XL) 150 MG 24 hr tablet TAKE 1 TABLET(150 MG) BY MOUTH DAILY  ? Cholecalciferol (VITAMIN D3 ADULT GUMMIES) 25 MCG (1000 UT) CHEW Chew 2 each by mouth daily.  ? fluticasone-salmeterol (ADVAIR HFA) 115-21 MCG/ACT inhaler Inhale 2 puffs into the lungs 2 (two) times daily.  ? levothyroxine (SYNTHROID) 125 MCG tablet TAKE 1 TABLET(125 MCG) BY MOUTH DAILY BEFORE AND BREAKFAST  ? pantoprazole (PROTONIX) 40 MG tablet Take 40 mg by mouth daily.  ? senna-docusate (SENOKOT-S) 8.6-50 MG tablet Take 2 tablets by mouth at bedtime.  ? ?Facility-Administered Encounter Medications as of 06/12/2021  ?Medication  ? diphenhydrAMINE (BENADRYL) capsule 50 mg  ?  heparin lock flush 100 unit/mL  ? sodium chloride flush (NS) 0.9 % injection 10 mL  ? ? ?Allergies (verified) ?Phenergan [promethazine hcl], Preservision areds 2 [multiple vitamins-minerals], Levaquin [levofloxacin], and Pravastatin  ? ?History: ?Past Medical History:  ?Diagnosis Date  ? Anemia   ? Arthritis   ? Bronchiectasis ([place])   ? Cancer [facility])   ? Cervicalgia   ? Constipation, chronic   ? Essential hypertension   ? GERD  (gastroesophageal reflux disease)   ? zantac  ? Heart murmur   ? History of blood transfusion 1959  ? [place]  ? Hyperlipidemia   ? Hypertension   ? Hyperthyroidism   ? Hypothyroid   ? Hypothyroidism   ? Iron deficiency anemia 04/20/2021  ? 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 Lumbar burst fracture ([place])   ? Lymphoproliferative disorder ([place])   ? Macular degeneration 2013  ? Both eyes   ? Macular degeneration, bilateral   ? 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 Osteopenia   ? Pneumonia   ? Pneumonia due to COVID-19 virus 2021  ? Required hospitalization  ? PONV (postoperative nausea and vomiting)   ? needs little anesthesia  ? Shingles   ? Shortness of breath   ? on exertion  ? Spleen enlarged   ? SUI (stress urinary incontinence, female)   ? Urinary, incontinence, stress female   ? Wears glasses   ? ?Past Surgical History:  ?Procedure Laterality Date  ? BREAST EXCISIONAL BIOPSY Left 1980  ? CARPAL TUNNEL RELEASE  1999  ? CATARACT EXTRACTION  2009, 2011  ? BOTH EYES  ? CATARACT EXTRACTION, BILATERAL    ? [place]  ? CESAREAN SECTION    ? COLONOSCOPY    ?  Dr Cristina Gong  ? DILATION AND CURETTAGE OF UTERUS    ? X2  ? HYSTEROSCOPY WITH D & C  01/07/2012  ? Procedure: DILATATION AND CURETTAGE /HYSTEROSCOPY;  Surgeon: Terrance Mass, MD;  Location: [facility];  Service: Gynecology;  Laterality: N/A;  intrauterine foley catheter for tamponode   ? IR IMAGING GUIDED PORT INSERTION  07/15/2018  ? IR REMOVAL TUN ACCESS W/ PORT W/O FL MOD SED  04/11/2021  ? LYMPH NODE BIOPSY Left 05/26/2018  ? Procedure: LEFT AXILLARY LYMPH NODE BIOPSY;  Surgeon: Fanny Skates, MD;  Location: [facility];  Service: General;  Laterality: Left;  ? ORIF ANKLE FRACTURE Left 12/12/2018  ? ORIF ANKLE FRACTURE Left 12/12/2018  ? Procedure: OPEN REDUCTION INTERNAL FIXATION (ORIF) ANKLE FRACTURE;  Surgeon: Meredith Pel, MD;  Location: [facility];  Service: Orthopedics;  Laterality: Left;  ? ORIF ANKLE FRACTURE Left 12/2018  ? TONSILLECTOMY    ?  TONSILLECTOMY AND ADENOIDECTOMY    ? TUBAL LIGATION    ? BY LAPAROSCOPY  ? WISDOM TOOTH EXTRACTION    ? ?Family History  ?Problem Relation Age of Onset  ? Ovarian cancer Mother   ? Breast cancer Mother 12  ? Hypertension Father   ? Prostate cancer Father   ? Kidney failure Father   ? Diabetes Father   ? Hyperlipidemia Brother   ? COPD Paternal Grandfather   ? Stroke Maternal Grandfather   ? Hypercalcemia Neg Hx   ? ?Social History  ? ?Socioeconomic History  ? Marital status: Married  ?  Spouse name: Not on file  ? Number of children: Not on file  ? Years of education: Not on file  ? Highest education level: Not on file  ?Occupational History  ?  Not on file  ?Tobacco Use  ? Smoking status: Never  ? Smokeless tobacco: Never  ?Vaping Use  ? Vaping Use: Never used  ?Substance and Sexual Activity  ? Alcohol use: Yes  ?  Comment: RARE  ? Drug use: Never  ? Sexual activity: Never  ?  Birth control/protection: Post-menopausal  ?Other Topics Concern  ? Not on file  ?Social History Narrative  ? ** Merged History Encounter **  ?    ? ** Merged History Encounter **  ?    ? ?Social Determinants of Health  ? ?Financial Resource Strain: Medium Risk  ? Difficulty of Paying Living Expenses: Somewhat hard  ?Food Insecurity: No Food Insecurity  ? Worried About Charity fundraiser in the Last Year: Never true  ? Ran Out of Food in the Last Year: Never true  ?Transportation Needs: No Transportation Needs  ? Lack of Transportation (Medical): No  ? Lack of Transportation (Non-Medical): No  ?Physical Activity: Insufficiently Active  ? Days of Exercise per Week: 3 days  ? Minutes of Exercise per Session: 30 min  ?Stress: No Stress Concern Present  ? Feeling of Stress : Not at all  ?Social Connections: Socially Integrated  ? Frequency of Communication with Friends and Family: More than three times a week  ? Frequency of Social Gatherings with Friends and Family: More than three times a week  ? Attends Religious Services: More than 4 times  per year  ? Active Member of Clubs or Organizations: Yes  ? Attends Archivist Meetings: More than 4 times per year  ? Marital Status: Married  ? ? ?Tobacco Counseling ?Counseling given: Not Answered ? ? ?Clinical Intake: ? ?Pr

## 2021-06-19 DIAGNOSIS — Z20822 Contact with and (suspected) exposure to covid-19: Secondary | ICD-10-CM | POA: Diagnosis not present

## 2021-06-20 ENCOUNTER — Encounter: Payer: Self-pay | Admitting: Hematology & Oncology

## 2021-06-20 ENCOUNTER — Inpatient Hospital Stay (HOSPITAL_BASED_OUTPATIENT_CLINIC_OR_DEPARTMENT_OTHER): Payer: Medicare Other | Admitting: Hematology & Oncology

## 2021-06-20 ENCOUNTER — Inpatient Hospital Stay: Payer: Medicare Other | Attending: Hematology & Oncology

## 2021-06-20 VITALS — BP 147/72 | HR 60 | Temp 98.0°F | Resp 17 | Wt 148.0 lb

## 2021-06-20 DIAGNOSIS — C8330 Diffuse large B-cell lymphoma, unspecified site: Secondary | ICD-10-CM | POA: Insufficient documentation

## 2021-06-20 DIAGNOSIS — Z79899 Other long term (current) drug therapy: Secondary | ICD-10-CM | POA: Diagnosis not present

## 2021-06-20 DIAGNOSIS — Z888 Allergy status to other drugs, medicaments and biological substances status: Secondary | ICD-10-CM | POA: Insufficient documentation

## 2021-06-20 DIAGNOSIS — C8580 Other specified types of non-Hodgkin lymphoma, unspecified site: Secondary | ICD-10-CM | POA: Diagnosis not present

## 2021-06-20 DIAGNOSIS — D509 Iron deficiency anemia, unspecified: Secondary | ICD-10-CM | POA: Insufficient documentation

## 2021-06-20 DIAGNOSIS — Z881 Allergy status to other antibiotic agents status: Secondary | ICD-10-CM | POA: Insufficient documentation

## 2021-06-20 DIAGNOSIS — D500 Iron deficiency anemia secondary to blood loss (chronic): Secondary | ICD-10-CM

## 2021-06-20 LAB — CBC WITH DIFFERENTIAL (CANCER CENTER ONLY)
Abs Immature Granulocytes: 0.03 10*3/uL (ref 0.00–0.07)
Basophils Absolute: 0.1 10*3/uL (ref 0.0–0.1)
Basophils Relative: 1 %
Eosinophils Absolute: 0.4 10*3/uL (ref 0.0–0.5)
Eosinophils Relative: 5 %
HCT: 40.9 % (ref 36.0–46.0)
Hemoglobin: 13.2 g/dL (ref 12.0–15.0)
Immature Granulocytes: 0 %
Lymphocytes Relative: 27 %
Lymphs Abs: 2.2 10*3/uL (ref 0.7–4.0)
MCH: 27.4 pg (ref 26.0–34.0)
MCHC: 32.3 g/dL (ref 30.0–36.0)
MCV: 84.9 fL (ref 80.0–100.0)
Monocytes Absolute: 1 10*3/uL (ref 0.1–1.0)
Monocytes Relative: 12 %
Neutro Abs: 4.4 10*3/uL (ref 1.7–7.7)
Neutrophils Relative %: 55 %
Platelet Count: 245 10*3/uL (ref 150–400)
RBC: 4.82 MIL/uL (ref 3.87–5.11)
RDW: 15.5 % (ref 11.5–15.5)
WBC Count: 8.2 10*3/uL (ref 4.0–10.5)
nRBC: 0 % (ref 0.0–0.2)

## 2021-06-20 LAB — CMP (CANCER CENTER ONLY)
ALT: 15 U/L (ref 0–44)
AST: 16 U/L (ref 15–41)
Albumin: 4.5 g/dL (ref 3.5–5.0)
Alkaline Phosphatase: 83 U/L (ref 38–126)
Anion gap: 9 (ref 5–15)
BUN: 23 mg/dL (ref 8–23)
CO2: 30 mmol/L (ref 22–32)
Calcium: 10 mg/dL (ref 8.9–10.3)
Chloride: 103 mmol/L (ref 98–111)
Creatinine: 1 mg/dL (ref 0.44–1.00)
GFR, Estimated: 56 mL/min — ABNORMAL LOW (ref >60)
Glucose, Bld: 107 mg/dL — ABNORMAL HIGH (ref 70–99)
Potassium: 4.9 mmol/L (ref 3.5–5.1)
Sodium: 142 mmol/L (ref 135–145)
Total Bilirubin: 0.3 mg/dL (ref 0.3–1.2)
Total Protein: 6.6 g/dL (ref 6.5–8.1)

## 2021-06-20 LAB — LACTATE DEHYDROGENASE: LDH: 84 U/L — ABNORMAL LOW (ref 98–192)

## 2021-06-20 NOTE — Progress Notes (Signed)
?Hematology and Oncology Follow Up Visit ? ?Amanda [name] ?[id number] ?[birth year] 81 y.o. ?06/20/2021 ? ? 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 ?Current Therapy:   ?R-CHOP x 6 cycles -- completed in 11/2018 ?Rituxan maintenance - q 3 months x 2 yrs -- complete in 01/2021 ?IV iron-Monoferric given on 04/25/2021 ?    ?Interim History:  Amanda [name] is back for follow-up.  Amanda [name] looks a little bit better.  We did go ahead and give Amanda [name] some IV iron back in February.  Hemoglobin is much better right now.  Amanda [name] is having some back issues.  Amanda [name] is going to have RFA done in the lower back.  Hopefully this will help with some of Amanda [name] discomfort. ? ?Amanda [name] has had no problems with fever.  Amanda [name] has had no issues with nausea or vomiting.  There is been no change in bowel or bladder habits. ? ?Amanda [name] did have a very nice Easter. ? ?Amanda [name] were going up to [place].  Their son is retiring from [employer].  He is a Clinical biochemist there.  I am so happy for them. ? ?Amanda [name] has had no rashes.  Amanda [name] has had no leg swelling. ? ?Overall, I would say performance status is probably ECOG 1.   ?  ? ?Medications:  ?Current Outpatient Medications:  ?  acetaminophen (TYLENOL) 325 MG tablet, Take 1-2 tablets (325-650 mg total) by mouth every 8 (eight) hours as needed for mild pain., Disp: , Rfl:  ?  albuterol (VENTOLIN HFA) 108 (90 Base) MCG/ACT inhaler, INHALE 2 PUFFS INTO THE LUNGS EVERY 6 HOURS AS NEEDED FOR WHEEZING OR SHORTNESS OF BREATH, Disp: 54 g, Rfl: 0 ?  amLODipine (NORVASC) 10 MG tablet, Take 1 tablet (10 mg total) by mouth daily., Disp: 90 tablet, Rfl: 1 ?  aspirin 81 MG chewable tablet, Chew 1 tablet (81 mg total) by mouth daily., Disp:  , Rfl:  ?  buPROPion (WELLBUTRIN XL) 150 MG 24 hr tablet, TAKE 1 TABLET(150 MG) BY MOUTH DAILY, Disp: 90 tablet, Rfl: 1 ?  Cholecalciferol (VITAMIN D3 ADULT GUMMIES) 25 MCG (1000 UT) CHEW, Chew 2 each by mouth daily., Disp: , Rfl:  ?  fluticasone-salmeterol  (ADVAIR HFA) 115-21 MCG/ACT inhaler, Inhale 2 puffs into the lungs 2 (two) times daily., Disp: 1 each, Rfl: 12 ?  levothyroxine (SYNTHROID) 125 MCG tablet, TAKE 1 TABLET(125 MCG) BY MOUTH DAILY BEFORE AND BREAKFAST, Disp: 90 tablet, Rfl: 1 ?  pantoprazole (PROTONIX) 40 MG tablet, Take 40 mg by mouth daily., Disp: , Rfl:  ?  senna-docusate (SENOKOT-S) 8.6-50 MG tablet, Take 2 tablets by mouth at bedtime., Disp: 60 tablet, Rfl: 0 ?No current facility-administered medications for this visit. ? ?Facility-Administered Medications Ordered in Other Visits:  ?  diphenhydrAMINE (BENADRYL) capsule 50 mg, 50 mg, Oral, Once, Tish Men, MD ?  heparin lock flush 100 unit/mL, 500 Units, Intracatheter, Once PRN, Tish Men, MD ?  sodium chloride flush (NS) 0.9 % injection 10 mL, 10 mL, Intracatheter, PRN, Tish Men, MD ? ?Allergies:  ?Allergies  ?Allergen Reactions  ? Phenergan [Promethazine Hcl] Other (See Comments)  ?  Jerking/agitation  ? Preservision Areds 2 [Multiple Vitamins-Minerals] Nausea Only  ? Levaquin [Levofloxacin] Nausea And Vomiting  ? Pravastatin Nausea And Vomiting  ? ? ?Past Medical History, Surgical history, Social history, and Family History were reviewed and updated. ? ?Review of Systems: ?Review of Systems  ?Constitutional: Negative.   ?HENT:  Negative.    ?Eyes:  Negative.   ?Respiratory: Negative.    ?Cardiovascular: Negative.   ?Gastrointestinal: Negative.   ?Endocrine: Negative.   ?Genitourinary: Negative.    ?Musculoskeletal: Negative.   ?Skin: Negative.   ?Neurological: Negative.   ?Hematological: Negative.   ?Psychiatric/Behavioral: Negative.    ? ?Physical Exam: ? weight is 148 lb (67.1 kg). Amanda [name] oral temperature is 98 ?F (36.7 ?C). Amanda [name] blood pressure is 147/72 (abnormal) and Amanda [name] pulse is 60. Amanda [name] respiration is 17 and oxygen saturation is 100%.  ? ?Wt Readings from Last 3 Encounters:  ?06/20/21 148 lb (67.1 kg)  ?06/12/21 145 lb (65.8 kg)  ?06/04/21 145 lb (65.8 kg)  ? ? ?Physical Exam ?Vitals reviewed.   ?HENT:  ?   Head: Normocephalic and atraumatic.  ?Eyes:  ?   Pupils: Pupils are equal, round, and reactive to light.  ?Cardiovascular:  ?   Rate and Rhythm: Normal rate and regular rhythm.  ?   Heart sounds: Normal heart sounds.  ?Pulmonary:  ?   Effort: Pulmonary effort is normal.  ?   Breath sounds: Normal breath sounds.  ?Abdominal:  ?   General: Bowel sounds are normal.  ?   Palpations: Abdomen is soft.  ?Musculoskeletal:     ?   General: No tenderness or deformity. Normal range of motion.  ?   Cervical back: Normal range of motion.  ?Lymphadenopathy:  ?   Cervical: No cervical adenopathy.  ?Skin: ?   General: Skin is warm and dry.  ?   Findings: No erythema or rash.  ?Neurological:  ?   Mental Status: Amanda [name] is alert and oriented to person, place, and time.  ?Psychiatric:     ?   Behavior: Behavior normal.     ?   Thought Content: Thought content normal.     ?   Judgment: Judgment normal.  ? ? ? ?Lab Results  ?Component Value Date  ? WBC 8.2 06/20/2021  ? HGB 13.2 06/20/2021  ? HCT 40.9 06/20/2021  ? MCV 84.9 06/20/2021  ? PLT 245 06/20/2021  ? ?  Chemistry   ?   ?Component Value Date/Time  ? NA 141 04/26/2021 1527  ? NA 146 (H) 05/26/2019 1108  ? K 4.5 04/26/2021 1527  ? CL 102 04/26/2021 1527  ? CO2 34 (H) 04/26/2021 1527  ? BUN 14 04/26/2021 1527  ? BUN 17 05/26/2019 1108  ? CREATININE 0.92 04/26/2021 1527  ? CREATININE 0.94 04/19/2021 1338  ? CREATININE 1.09 (H) 02/09/2021 1545  ? GLU 91 06/23/2018 0000  ?    ?Component Value Date/Time  ? CALCIUM 9.6 04/26/2021 1527  ? CALCIUM 12.3 (H) 06/16/2018 1639  ? ALKPHOS 77 04/26/2021 1527  ? AST 11 04/26/2021 1527  ? AST 13 (L) 04/19/2021 1338  ? ALT 14 04/26/2021 1527  ? ALT 14 04/19/2021 1338  ? BILITOT 0.4 04/26/2021 1527  ? BILITOT 0.4 04/19/2021 1338  ?  ? ? ?Impression and Plan: ?Amanda [name] is a very charming 81 year old white female.  Amanda [name] has a transformed marginal zone lymphoma.  Amanda [name] received maintenance Rituxan.  Amanda [name] completed this back in November 2022.  Amanda [name]  PET scan looks fantastic after the Rituxan was finished. ? ?I am happy that Amanda [name] quality life is doing better.  The iron clearly helped this.  I am unsure if Amanda [name] has underlying COPD.  Amanda [name] breathing seems to be doing pretty well right now. ? ?At this point, I think we try to move Amanda [name] appointments out a little bit further.  We will try to get Amanda [name] back now in about 4 months.  This will get Amanda [name] through much of the Spring and Summer. ? ? ?Volanda Napoleon, MD ?4/19/20231:58 PM ?

## 2021-06-22 ENCOUNTER — Other Ambulatory Visit: Payer: Self-pay | Admitting: Family Medicine

## 2021-07-01 DIAGNOSIS — M810 Age-related osteoporosis without current pathological fracture: Secondary | ICD-10-CM | POA: Diagnosis not present

## 2021-07-01 DIAGNOSIS — I10 Essential (primary) hypertension: Secondary | ICD-10-CM

## 2021-07-10 ENCOUNTER — Other Ambulatory Visit: Payer: Self-pay | Admitting: Family Medicine

## 2021-07-10 DIAGNOSIS — M47816 Spondylosis without myelopathy or radiculopathy, lumbar region: Secondary | ICD-10-CM | POA: Diagnosis not present

## 2021-07-10 DIAGNOSIS — I10 Essential (primary) hypertension: Secondary | ICD-10-CM

## 2021-07-20 DIAGNOSIS — D045 Carcinoma in situ of skin of trunk: Secondary | ICD-10-CM | POA: Diagnosis not present

## 2021-07-20 DIAGNOSIS — D044 Carcinoma in situ of skin of scalp and neck: Secondary | ICD-10-CM | POA: Diagnosis not present

## 2021-07-24 DIAGNOSIS — M47816 Spondylosis without myelopathy or radiculopathy, lumbar region: Secondary | ICD-10-CM | POA: Diagnosis not present

## 2021-08-20 ENCOUNTER — Other Ambulatory Visit: Payer: Self-pay | Admitting: Pharmacist

## 2021-08-21 DIAGNOSIS — M7918 Myalgia, other site: Secondary | ICD-10-CM | POA: Diagnosis not present

## 2021-08-21 DIAGNOSIS — M47816 Spondylosis without myelopathy or radiculopathy, lumbar region: Secondary | ICD-10-CM | POA: Diagnosis not present

## 2021-08-23 DIAGNOSIS — H40013 Open angle with borderline findings, low risk, bilateral: Secondary | ICD-10-CM | POA: Diagnosis not present

## 2021-09-06 ENCOUNTER — Telehealth: Payer: Self-pay | Admitting: Pharmacist

## 2021-09-06 ENCOUNTER — Encounter: Payer: Self-pay | Admitting: Family Medicine

## 2021-09-06 MED ORDER — OXYBUTYNIN CHLORIDE ER 10 MG PO TB24: 10.0000 mg | ORAL_TABLET | Freq: Every day | ORAL | 1 refills | Status: DC

## 2021-09-06 NOTE — Telephone Encounter (Signed)
See Below message from patient. She had been prescribed oxybutynin ER '10mg'$  daily but never started due to concerns about side effects. She has taken for the last 3 nights. She feels that it has been helpful for incontinence but she concerned about side effects. Discussed that it can having "drying side effects" - dry mouth, constipation, dry eyes / blurry vision. She reports a little diarrhea over the last 3 days and some blurry vision. She would like to continue to take a few more day. Plan to call her next week and see how she is doing. I sent in Rx fro oxybutynin not realizing that patient already had some on hand. Called [facility] and asked them to profile it until patient needs.   Alternatives to consider in future:  oxybutynin '5mg'$  qhs; Myrbetriq or Solifenacin (generic Vesicare)  both are tier 3 - $45/30 days or $135 / 90 days.    [facility]  P [facility] (supporting [name], DO) 2 hours ago (12:53 PM)    Please have Desa Rech call me re RX oxybutinin. Thanks  [phone].

## 2021-09-09 ENCOUNTER — Other Ambulatory Visit: Payer: Self-pay | Admitting: Family Medicine

## 2021-09-11 ENCOUNTER — Telehealth: Payer: Medicare Other

## 2021-09-12 ENCOUNTER — Encounter: Payer: Self-pay | Admitting: Family Medicine

## 2021-09-12 ENCOUNTER — Ambulatory Visit (INDEPENDENT_AMBULATORY_CARE_PROVIDER_SITE_OTHER): Payer: Medicare Other | Admitting: Pharmacist

## 2021-09-12 ENCOUNTER — Telehealth: Payer: Medicare Other

## 2021-09-12 DIAGNOSIS — N393 Stress incontinence (female) (male): Secondary | ICD-10-CM

## 2021-09-12 DIAGNOSIS — I10 Essential (primary) hypertension: Secondary | ICD-10-CM
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
## 2021-09-12 NOTE — Patient Instructions (Signed)
Mrs. [place],  It was a pleasure speaking with you  Please find both your clinical goal and Chronic Care Management care plan in your My Chart.   If you have any questions or concerns, please feel free to contact me either at the phone number below or with a MyChart message.   Keep up the good work!  Cherre Robins, PharmD Clinical Pharmacist [place] [phone] (direct line)  [phone] (main office number)   Patient verbalizes understanding of instructions and care plan provided today and agrees to view in [place]. Active MyChart status and patient understanding of how to access instructions and care plan via MyChart confirmed with patient.

## 2021-09-13 ENCOUNTER — Telehealth: Payer: Self-pay | Admitting: Pharmacist

## 2021-09-13 DIAGNOSIS — M549 Dorsalgia, unspecified: Secondary | ICD-10-CM

## 2021-09-13 DIAGNOSIS — M542 Cervicalgia: Secondary | ICD-10-CM

## 2021-09-13 NOTE — Telephone Encounter (Signed)
Patient is requesting prescription for massage therapy for Myofascial release and deep tissue massage.  She has had back pain for many year and tried - physical therapy, aquatic therapy, nerve ablations, steroid injections and visited chiropractors.  She has had the most relief of her back pain with massage therapy. She would like Rx to possibly see if insurance will cover / reimburse her for treatment.  Will forward to PCP.

## 2021-09-18 NOTE — Telephone Encounter (Signed)
Rx placed up front.  Left message on machine that it is ready for pick up.

## 2021-09-29 ENCOUNTER — Other Ambulatory Visit: Payer: Self-pay | Admitting: Family Medicine

## 2021-10-01 DIAGNOSIS — I10 Essential (primary) hypertension: Secondary | ICD-10-CM

## 2021-10-03 ENCOUNTER — Ambulatory Visit (INDEPENDENT_AMBULATORY_CARE_PROVIDER_SITE_OTHER): Payer: Medicare Other | Admitting: Family Medicine

## 2021-10-03 ENCOUNTER — Encounter: Payer: Self-pay | Admitting: Family Medicine

## 2021-10-03 VITALS — BP 136/67 | HR 67 | Temp 97.8°F | Ht 60.0 in | Wt 149.2 lb

## 2021-10-03 DIAGNOSIS — R300 Dysuria: Secondary | ICD-10-CM | POA: Diagnosis not present

## 2021-10-03 LAB — POC URINALSYSI DIPSTICK (AUTOMATED)
Blood, UA: NEGATIVE
Glucose, UA: NEGATIVE
Ketones, UA: NEGATIVE
Nitrite, UA: NEGATIVE
Protein, UA: POSITIVE — AB
Spec Grav, UA: >=1.03 — AB (ref 1.010–1.025)
Urobilinogen, UA: 0.2 E.U./dL
pH, UA: 6 (ref 5.0–8.0)

## 2021-10-03 MED ORDER — CEPHALEXIN 500 MG PO CAPS: 500.0000 mg | ORAL_CAPSULE | Freq: Two times a day (BID) | ORAL | 0 refills | Status: AC

## 2021-10-03 NOTE — Patient Instructions (Addendum)
Sending urine off for culture. Since you are going out of town soon, I will send you some Keflex to have. If we have to change it based on the culture, we will let you know.  I would recommend rechecking your urine in about 2-3 weeks to ensure it has cleared up. If symptoms persist, let us know.

## 2021-10-03 NOTE — Progress Notes (Signed)
Acute Office Visit  Subjective:     Patient ID: Amanda [name], female    DOB: [birth year], 81 y.o.   MRN: [id number]  CC: UTI symptoms    HPI Patient is in today for dysuria.   Patient reports 1 week of dysuria and some pelvic/vulvar pressure and discomfort with sitting that only lasts for a few seconds at a time. Additionally states she has urinary frequency, urgency, and a "shocking" sensation down her arms and legs when she voids. Reports she has been struggling with some incontinence for the past several months and PCP started her on oxybutynin a few weeks ago, but she hasn't noticed much improvement yet. She denies any fevers, chills, hematuria, abdominal/flank/back pain, vaginal discharge, itching, bleeding, nausea, vomiting, diarrhea. She is going out of town in a few days and wants to get treatment started as soon as possible.     ROS All review of systems negative except what is listed in the HPI      Objective:    BP 136/67   Pulse 67   Temp 97.8 F (36.6 C)   Ht 5' (1.524 m)   Wt 149 lb 3.2 oz (67.7 kg)   BMI 29.14 kg/m    Physical Exam Vitals reviewed.  Constitutional:      Appearance: Normal appearance.  Cardiovascular:     Rate and Rhythm: Normal rate.  Genitourinary:    General: Normal vulva.     Labia:        Right: No rash or tenderness.        Left: No rash or tenderness.      Vagina: No vaginal discharge or tenderness.  Skin:    General: Skin is warm and dry.  Neurological:     General: No focal deficit present.     Mental Status: She is alert and oriented to person, place, and time. Mental status is at baseline.  Psychiatric:        Mood and Affect: Mood normal.        Behavior: Behavior normal.        Thought Content: Thought content normal.        Judgment: Judgment normal.      Results for orders placed or performed in visit on 10/03/21  POCT Urinalysis Dipstick (Automated)  Result Value Ref Range   Color, UA dark yellow     Clarity, UA cloudy    Glucose, UA Negative Negative   Bilirubin, UA moderate    Ketones, UA neg    Spec Grav, UA >=1.030 (A) 1.010 - 1.025   Blood, UA neg    pH, UA 6.0 5.0 - 8.0   Protein, UA Positive (A) Negative   Urobilinogen, UA 0.2 0.2 or 1.0 E.U./dL   Nitrite, UA neg    Leukocytes, UA Trace (A) Negative        Assessment & Plan:   Problem List Items Addressed This Visit   None Visit Diagnoses     Dysuria    -  Primary UA with bilirubin, protein, and trace leuks. Sending urine off for culture. Since you are going out of town soon, I will send you some Keflex to have. If we have to change it based on the culture, we will let you know.  I would recommend rechecking your urine in about 2-3 weeks to ensure it has cleared up. If symptoms persist, let us know.    Relevant Medications   cephALEXin (KEFLEX) 500 MG capsule   Other  Relevant Orders   POCT Urinalysis Dipstick (Automated) (Completed)   Urine Culture       Meds ordered this encounter  Medications   cephALEXin (KEFLEX) 500 MG capsule    Sig: Take 1 capsule (500 mg total) by mouth 2 (two) times daily for 5 days.    Dispense:  10 capsule    Refill:  0    Order Specific Question:   Supervising Provider    Answer:   Penni Homans A [[id number]]    Return if symptoms worsen or fail to improve.  Terrilyn Saver, NP

## 2021-10-05 ENCOUNTER — Telehealth: Payer: Self-pay | Admitting: *Deleted

## 2021-10-05 LAB — URINE CULTURE
MICRO NUMBER:: 13726496
SPECIMEN QUALITY:: ADEQUATE

## 2021-10-05 NOTE — Telephone Encounter (Signed)
Called and lvm about rescheduling appointment with Judson Roch - requested callback to confirm

## 2021-10-08 ENCOUNTER — Encounter: Payer: Self-pay | Admitting: Family Medicine

## 2021-10-13 ENCOUNTER — Other Ambulatory Visit: Payer: Self-pay | Admitting: Family Medicine

## 2021-10-18 ENCOUNTER — Encounter: Payer: Self-pay | Admitting: Family

## 2021-10-18 ENCOUNTER — Inpatient Hospital Stay (HOSPITAL_BASED_OUTPATIENT_CLINIC_OR_DEPARTMENT_OTHER): Payer: Medicare Other | Admitting: Family

## 2021-10-18 ENCOUNTER — Inpatient Hospital Stay: Payer: Medicare Other | Attending: Hematology & Oncology

## 2021-10-18 ENCOUNTER — Encounter: Payer: Self-pay | Admitting: Family Medicine

## 2021-10-18 ENCOUNTER — Ambulatory Visit (INDEPENDENT_AMBULATORY_CARE_PROVIDER_SITE_OTHER): Payer: Medicare Other | Admitting: Family Medicine

## 2021-10-18 ENCOUNTER — Other Ambulatory Visit: Payer: Self-pay

## 2021-10-18 VITALS — BP 141/67 | HR 69 | Temp 98.0°F | Resp 18 | Ht 60.0 in | Wt 149.1 lb

## 2021-10-18 VITALS — BP 122/68 | HR 74 | Temp 98.0°F | Resp 18 | Ht 60.0 in | Wt 150.0 lb

## 2021-10-18 DIAGNOSIS — D500 Iron deficiency anemia secondary to blood loss (chronic): Secondary | ICD-10-CM | POA: Diagnosis not present

## 2021-10-18 DIAGNOSIS — Z0000 Encounter for general adult medical examination without abnormal findings: Secondary | ICD-10-CM

## 2021-10-18 DIAGNOSIS — R300 Dysuria: Secondary | ICD-10-CM

## 2021-10-18 DIAGNOSIS — I10 Essential (primary) hypertension: Secondary | ICD-10-CM

## 2021-10-18 DIAGNOSIS — C8580 Other specified types of non-Hodgkin lymphoma, unspecified site: Secondary | ICD-10-CM

## 2021-10-18 DIAGNOSIS — Z79899 Other long term (current) drug therapy: Secondary | ICD-10-CM | POA: Diagnosis not present

## 2021-10-18 DIAGNOSIS — C8330 Diffuse large B-cell lymphoma, unspecified site: Secondary | ICD-10-CM | POA: Insufficient documentation

## 2021-10-18 DIAGNOSIS — E039 Hypothyroidism, unspecified: Secondary | ICD-10-CM | POA: Diagnosis not present

## 2021-10-18 DIAGNOSIS — R82998 Other abnormal findings in urine: Secondary | ICD-10-CM

## 2021-10-18 DIAGNOSIS — Z881 Allergy status to other antibiotic agents status: Secondary | ICD-10-CM | POA: Insufficient documentation

## 2021-10-18 DIAGNOSIS — Z888 Allergy status to other drugs, medicaments and biological substances status: Secondary | ICD-10-CM | POA: Diagnosis not present

## 2021-10-18 DIAGNOSIS — R5383 Other fatigue: Secondary | ICD-10-CM | POA: Diagnosis not present

## 2021-10-18 DIAGNOSIS — D509 Iron deficiency anemia, unspecified: Secondary | ICD-10-CM | POA: Insufficient documentation

## 2021-10-18 DIAGNOSIS — E785 Hyperlipidemia, unspecified: Secondary | ICD-10-CM | POA: Diagnosis not present

## 2021-10-18 LAB — LIPID PANEL
Cholesterol: 252 mg/dL — ABNORMAL HIGH (ref 0–200)
HDL: 68.5 mg/dL (ref >39.00)
LDL Cholesterol: 152 mg/dL — ABNORMAL HIGH (ref 0–99)
NonHDL: 183.3
Total CHOL/HDL Ratio: 4
Triglycerides: 159 mg/dL — ABNORMAL HIGH (ref 0.0–149.0)
VLDL: 31.8 mg/dL (ref 0.0–40.0)

## 2021-10-18 LAB — CMP (CANCER CENTER ONLY)
ALT: 11 U/L (ref 0–44)
AST: 13 U/L — ABNORMAL LOW (ref 15–41)
Albumin: 4.5 g/dL (ref 3.5–5.0)
Alkaline Phosphatase: 61 U/L (ref 38–126)
Anion gap: 8 (ref 5–15)
BUN: 17 mg/dL (ref 8–23)
CO2: 29 mmol/L (ref 22–32)
Calcium: 9.9 mg/dL (ref 8.9–10.3)
Chloride: 103 mmol/L (ref 98–111)
Creatinine: 1.07 mg/dL — ABNORMAL HIGH (ref 0.44–1.00)
GFR, Estimated: 52 mL/min — ABNORMAL LOW (ref >60)
Glucose, Bld: 118 mg/dL — ABNORMAL HIGH (ref 70–99)
Potassium: 4.2 mmol/L (ref 3.5–5.1)
Sodium: 140 mmol/L (ref 135–145)
Total Bilirubin: 0.4 mg/dL (ref 0.3–1.2)
Total Protein: 6.7 g/dL (ref 6.5–8.1)

## 2021-10-18 LAB — CBC WITH DIFFERENTIAL (CANCER CENTER ONLY)
Abs Immature Granulocytes: 0.04 10*3/uL (ref 0.00–0.07)
Basophils Absolute: 0.1 10*3/uL (ref 0.0–0.1)
Basophils Relative: 1 %
Eosinophils Absolute: 0.2 10*3/uL (ref 0.0–0.5)
Eosinophils Relative: 2 %
HCT: 40.5 % (ref 36.0–46.0)
Hemoglobin: 13.7 g/dL (ref 12.0–15.0)
Immature Granulocytes: 1 %
Lymphocytes Relative: 19 %
Lymphs Abs: 1.4 10*3/uL (ref 0.7–4.0)
MCH: 29.2 pg (ref 26.0–34.0)
MCHC: 33.8 g/dL (ref 30.0–36.0)
MCV: 86.4 fL (ref 80.0–100.0)
Monocytes Absolute: 0.7 10*3/uL (ref 0.1–1.0)
Monocytes Relative: 9 %
Neutro Abs: 5.1 10*3/uL (ref 1.7–7.7)
Neutrophils Relative %: 68 %
Platelet Count: 251 10*3/uL (ref 150–400)
RBC: 4.69 MIL/uL (ref 3.87–5.11)
RDW: 13.7 % (ref 11.5–15.5)
WBC Count: 7.6 10*3/uL (ref 4.0–10.5)
nRBC: 0 % (ref 0.0–0.2)

## 2021-10-18 LAB — COMPREHENSIVE METABOLIC PANEL
ALT: 12 U/L (ref 0–35)
AST: 14 U/L (ref 0–37)
Albumin: 4.5 g/dL (ref 3.5–5.2)
Alkaline Phosphatase: 69 U/L (ref 39–117)
BUN: 17 mg/dL (ref 6–23)
CO2: 33 mEq/L — ABNORMAL HIGH (ref 19–32)
Calcium: 9.9 mg/dL (ref 8.4–10.5)
Chloride: 102 mEq/L (ref 96–112)
Creatinine, Ser: 0.95 mg/dL (ref 0.40–1.20)
GFR: 55.68 mL/min — ABNORMAL LOW (ref >60.00)
Glucose, Bld: 79 mg/dL (ref 70–99)
Potassium: 4.8 mEq/L (ref 3.5–5.1)
Sodium: 143 mEq/L (ref 135–145)
Total Bilirubin: 0.4 mg/dL (ref 0.2–1.2)
Total Protein: 6.3 g/dL (ref 6.0–8.3)

## 2021-10-18 LAB — POC URINALSYSI DIPSTICK (AUTOMATED)
Bilirubin, UA: NEGATIVE
Blood, UA: NEGATIVE
Glucose, UA: NEGATIVE
Ketones, UA: NEGATIVE
Nitrite, UA: NEGATIVE
Protein, UA: NEGATIVE
Spec Grav, UA: 1.01 (ref 1.010–1.025)
Urobilinogen, UA: 0.2 E.U./dL
pH, UA: 6.5 (ref 5.0–8.0)

## 2021-10-18 LAB — LACTATE DEHYDROGENASE: LDH: 90 U/L — ABNORMAL LOW (ref 98–192)

## 2021-10-18 LAB — CBC WITH DIFFERENTIAL/PLATELET
Basophils Absolute: 0.1 10*3/uL (ref 0.0–0.1)
Basophils Relative: 1 % (ref 0.0–3.0)
Eosinophils Absolute: 0.2 10*3/uL (ref 0.0–0.7)
Eosinophils Relative: 2.4 % (ref 0.0–5.0)
HCT: 41.5 % (ref 36.0–46.0)
Hemoglobin: 13.7 g/dL (ref 12.0–15.0)
Lymphocytes Relative: 17.6 % (ref 12.0–46.0)
Lymphs Abs: 1.4 10*3/uL (ref 0.7–4.0)
MCHC: 32.9 g/dL (ref 30.0–36.0)
MCV: 86.5 fl (ref 78.0–100.0)
Monocytes Absolute: 0.9 10*3/uL (ref 0.1–1.0)
Monocytes Relative: 11.8 % (ref 3.0–12.0)
Neutro Abs: 5.2 10*3/uL (ref 1.4–7.7)
Neutrophils Relative %: 67.2 % (ref 43.0–77.0)
Platelets: 262 10*3/uL (ref 150.0–400.0)
RBC: 4.8 Mil/uL (ref 3.87–5.11)
RDW: 14.4 % (ref 11.5–15.5)
WBC: 7.7 10*3/uL (ref 4.0–10.5)

## 2021-10-18 LAB — TSH: TSH: 1.97 u[IU]/mL (ref 0.35–5.50)

## 2021-10-18 LAB — IRON AND IRON BINDING CAPACITY (CC-WL,HP ONLY)
Iron: 102 ug/dL (ref 28–170)
Saturation Ratios: 34 % — ABNORMAL HIGH (ref 10.4–31.8)
TIBC: 304 ug/dL (ref 250–450)
UIBC: 202 ug/dL (ref 148–442)

## 2021-10-18 LAB — FERRITIN: Ferritin: 450 ng/mL — ABNORMAL HIGH (ref 11–307)

## 2021-10-18 MED ORDER — AMLODIPINE BESYLATE 10 MG PO TABS: ORAL_TABLET | ORAL | 1 refills | Status: DC

## 2021-10-18 NOTE — Assessment & Plan Note (Signed)
Encourage heart healthy diet such as MIND or DASH diet, increase exercise, avoid trans fats, simple carbohydrates and processed foods, consider a krill or fish or flaxseed oil cap daily.  °

## 2021-10-18 NOTE — Assessment & Plan Note (Signed)
Check labs 

## 2021-10-18 NOTE — Progress Notes (Signed)
Subjective:   By signing my name below, I, Amanda [name], attest that this documentation has been prepared under the direction and in the presence of [name], DO  10/18/2021    Patient ID: Amanda [name], female    DOB: [birth year], 81 y.o.   MRN: [id number]  Chief Complaint  Patient presents with   Hypertension   Hyperlipidemia   Follow-up    HPI Patient is in today for a comprehensive physical exam.   She complains of feeling tired easily. She noticed feeling tired more easily while completing daily chores. She feels find when completing activities outside her house.  She continues having burning while urinating. She is taking penicillin for an unrelated issue at this time.  She is requesting a refill for 10 mg amlodipine.  She has an upcomming hearing test and is requesting for her ears to be clear.  She continues following up with her oncologist regularly.  She is regularly receiving massages and reports her pain has improved. She denies having any fever, new moles, congestion, sinus pain, sore throat, chest pain, palpitations, cough, shortness of breath, wheezing, nausea, vomiting, diarrhea, constipation, frequency, abdominal pain, hematuria, new muscle pain, new joint pain, headaches. She has no changes in family medical history. She has no new surgical procedures to report. She does not have the shingrix vaccines.  She is UTD on vision care. She was diagnosed with elongated pupils. She noticed her vision has decreased. She does not drive as often due to her vision.    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   Lumbar burst fracture ([place])    Lymphoproliferative disorder ([place])    Macular degeneration 2013   Both eyes    Macular degeneration, bilateral    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[place])    Osteopenia    Pneumonia    Pneumonia due to COVID-19 virus 2021   Required hospitalization   PONV (postoperative nausea and vomiting)    needs little anesthesia   Shingles    Shortness of breath    on exertion   Spleen enlarged    SUI (stress urinary incontinence, female)    Urinary, incontinence, stress female    Wears glasses     Past Surgical History:  Procedure Laterality Date   BREAST EXCISIONAL BIOPSY Left [place]   CATARACT EXTRACTION  2009, 2011   BOTH EYES   CATARACT EXTRACTION, BILATERAL     CESAREAN SECTION  1959   CESAREAN SECTION     COLONOSCOPY      Dr Cristina Gong   DILATION AND CURETTAGE OF UTERUS     X2   HYSTEROSCOPY WITH D & C  01/07/2012   Procedure: DILATATION AND CURETTAGE /HYSTEROSCOPY;  Surgeon: Terrance Mass, MD;  Location: [facility];  Service: Gynecology;  Laterality: N/A;  intrauterine foley catheter for tamponode    IR IMAGING GUIDED PORT INSERTION  07/15/2018   IR REMOVAL TUN ACCESS W/  PORT W/O FL MOD SED  04/11/2021   LYMPH NODE BIOPSY Left 05/26/2018   Procedure: LEFT AXILLARY LYMPH NODE BIOPSY;  Surgeon: Fanny Skates, MD;  Location: [facility];  Service: General;  Laterality: Left;   ORIF ANKLE FRACTURE Left 12/12/2018   ORIF ANKLE FRACTURE Left 12/12/2018   Procedure: OPEN REDUCTION INTERNAL FIXATION (ORIF) ANKLE FRACTURE;  Surgeon: Meredith Pel, MD;  Location: [facility];  Service: Orthopedics;  Laterality: Left;   ORIF ANKLE FRACTURE Left 12/2018   TONSILLECTOMY     TONSILLECTOMY AND ADENOIDECTOMY     TUBAL LIGATION     BY LAPAROSCOPY   WISDOM TOOTH EXTRACTION      Family History  Problem Relation Age of Onset   Ovarian cancer Mother    Breast cancer Mother 66   Hypertension Father     Prostate cancer Father    Kidney failure Father    Diabetes Father    Hyperlipidemia Brother    COPD Paternal Grandfather    Stroke Maternal Grandfather    Hypercalcemia Neg Hx     Social History   Socioeconomic History   Marital status: Married    Spouse name: Not on file   Number of children: Not on file   Years of education: Not on file   Highest education level: Not on file  Occupational History   Not on file  Tobacco Use   Smoking status: Never   Smokeless tobacco: Never  Vaping Use   Vaping Use: Never used  Substance and Sexual Activity   Alcohol use: Yes    Comment: RARE   Drug use: Never   Sexual activity: Never    Birth control/protection: Post-menopausal  Other Topics Concern   Not on file  Social History Narrative   ** Merged History Encounter **       ** Merged History Encounter **       Social Determinants of Health   Financial Resource Strain: Medium Risk (05/04/2021)   Overall Financial Resource Strain (CARDIA)    Difficulty of Paying Living Expenses: Somewhat hard  Food Insecurity: No Food Insecurity (06/12/2021)   Hunger Vital Sign    Worried About Running Out of Food in the Last Year: Never true    Ran Out of Food in the Last Year: Never true  Transportation Needs: No Transportation Needs (01/16/2021)   PRAPARE - Hydrologist (Medical): No    Lack of Transportation (Non-Medical): No  Physical Activity: Insufficiently Active (01/16/2021)   Exercise Vital Sign    Days of Exercise per Week: 3 days    Minutes of Exercise per Session: 30 min  Stress: No Stress Concern Present (06/12/2021)   [place]    Feeling of Stress : Not at all  Social Connections: [place] (06/12/2021)   Social Connection and Isolation Panel [NHANES]    Frequency of Communication with Friends and Family: More than three times a week    Frequency of Social Gatherings with  Friends and Family: More than three times a week    Attends Religious Services: More than 4 times per year    Active Member of [employer] or Organizations: Yes    Attends Archivist Meetings: More than 4 times per year    Marital Status: Married  Human resources officer Violence: Not At Risk (06/12/2021)   Humiliation, Afraid, Rape, and Kick questionnaire    Fear of Current or Ex-Partner: No  Emotionally Abused: No    Physically Abused: No    Sexually Abused: No    Outpatient Medications Prior to Visit  Medication Sig Dispense Refill   acetaminophen (TYLENOL) 325 MG tablet Take 1-2 tablets (325-650 mg total) by mouth every 8 (eight) hours as needed for mild pain.     albuterol (VENTOLIN HFA) 108 (90 Base) MCG/ACT inhaler INHALE 2 PUFFS INTO THE LUNGS EVERY 6 HOURS AS NEEDED FOR WHEEZING OR SHORTNESS OF BREATH 54 g 0   aspirin 81 MG chewable tablet Chew 1 tablet (81 mg total) by mouth daily.     buPROPion (WELLBUTRIN XL) 150 MG 24 hr tablet TAKE 1 TABLET(150 MG) BY MOUTH DAILY 90 tablet 1   Cholecalciferol (VITAMIN D3 ADULT GUMMIES) 25 MCG (1000 UT) CHEW Chew 2 each by mouth daily.     levothyroxine (SYNTHROID) 125 MCG tablet TAKE 1 TABLET(125 MCG) BY MOUTH DAILY BEFORE BREAKFAST 90 tablet 1   oxybutynin (DITROPAN-XL) 10 MG 24 hr tablet Take 1 tablet (10 mg total) by mouth at bedtime. 30 tablet 1   pantoprazole (PROTONIX) 40 MG tablet TAKE 1 TABLET(40 MG) BY MOUTH DAILY 90 tablet 0   senna-docusate (SENOKOT-S) 8.6-50 MG tablet Take 2 tablets by mouth at bedtime. 60 tablet 0   amLODipine (NORVASC) 10 MG tablet TAKE 1 TABLET(10 MG) BY MOUTH DAILY 90 tablet 1   No facility-administered medications prior to visit.    Allergies  Allergen Reactions   Phenergan [Promethazine Hcl] Other (See Comments)    Jerking/agitation   Preservision Areds 2 [Multiple Vitamins-Minerals] Nausea Only   Levaquin [Levofloxacin] Nausea And Vomiting   Pravastatin Nausea And Vomiting    Review of Systems   Constitutional:  Negative for fever and malaise/fatigue.  HENT:  Negative for congestion, sinus pain and sore throat.   Eyes:  Negative for blurred vision.  Respiratory:  Negative for cough, shortness of breath and wheezing.   Cardiovascular:  Negative for chest pain, palpitations and leg swelling.  Gastrointestinal:  Negative for abdominal pain, blood in stool, constipation, diarrhea, nausea and vomiting.  Genitourinary:  Positive for dysuria. Negative for frequency and hematuria.  Musculoskeletal:  Negative for falls.       (-)new muscle pain (-)new joint pain  Skin:  Negative for rash.       (-)New moles  Neurological:  Negative for dizziness, loss of consciousness and headaches.  Endo/Heme/Allergies:  Negative for environmental allergies.  Psychiatric/Behavioral:  Negative for depression. The patient is not nervous/anxious.        Objective:    Physical Exam Vitals and nursing note reviewed.  Constitutional:      General: She is not in acute distress.    Appearance: Normal appearance. She is not ill-appearing.  HENT:     Head: Normocephalic and atraumatic.     Right Ear: Tympanic membrane, ear canal and external ear normal.     Left Ear: Tympanic membrane, ear canal and external ear normal.  Eyes:     Extraocular Movements: Extraocular movements intact.     Pupils: Pupils are equal, round, and reactive to light.  Cardiovascular:     Rate and Rhythm: Normal rate and regular rhythm.     Heart sounds: Normal heart sounds. No murmur heard.    No gallop.  Pulmonary:     Effort: Pulmonary effort is normal. No respiratory distress.     Breath sounds: Normal breath sounds. No wheezing or rales.  Abdominal:     General: Bowel sounds are normal.  There is no distension.     Palpations: Abdomen is soft.     Tenderness: There is no abdominal tenderness. There is no guarding.  Skin:    General: Skin is warm and dry.  Neurological:     Mental Status: She is alert and oriented to  person, place, and time.  Psychiatric:        Judgment: Judgment normal.     BP 122/68 (BP Location: Right Arm, Patient Position: Sitting, Cuff Size: Normal)   Pulse 74   Temp 98 F (36.7 C) (Oral)   Resp 18   Ht 5' (1.524 m)   Wt 150 lb (68 kg)   SpO2 97%   BMI 29.29 kg/m  Wt Readings from Last 3 Encounters:  10/18/21 150 lb (68 kg)  10/18/21 149 lb 1.3 oz (67.6 kg)  10/03/21 149 lb 3.2 oz (67.7 kg)    Diabetic Foot Exam - Simple   No data filed    Lab Results  Component Value Date   WBC 7.6 10/18/2021   HGB 13.7 10/18/2021   HCT 40.5 10/18/2021   PLT 251 10/18/2021   GLUCOSE 118 (H) 10/18/2021   CHOL 222 (H) 01/04/2021   TRIG 146.0 01/04/2021   HDL 68.30 01/04/2021   LDLDIRECT 123.0 04/27/2020   LDLCALC 125 (H) 01/04/2021   ALT 11 10/18/2021   AST 13 (L) 10/18/2021   NA 140 10/18/2021   K 4.2 10/18/2021   CL 103 10/18/2021   CREATININE 1.07 (H) 10/18/2021   BUN 17 10/18/2021   CO2 29 10/18/2021   TSH 1.48 02/09/2021   INR 1.0 04/01/2020   HGBA1C 6.1 (H) 11/18/2014    Lab Results  Component Value Date   TSH 1.48 02/09/2021   Lab Results  Component Value Date   WBC 7.6 10/18/2021   HGB 13.7 10/18/2021   HCT 40.5 10/18/2021   MCV 86.4 10/18/2021   PLT 251 10/18/2021   Lab Results  Component Value Date   NA 140 10/18/2021   K 4.2 10/18/2021   CO2 29 10/18/2021   GLUCOSE 118 (H) 10/18/2021   BUN 17 10/18/2021   CREATININE 1.07 (H) 10/18/2021   BILITOT 0.4 10/18/2021   ALKPHOS 61 10/18/2021   AST 13 (L) 10/18/2021   ALT 11 10/18/2021   PROT 6.7 10/18/2021   ALBUMIN 4.5 10/18/2021   CALCIUM 9.9 10/18/2021   ANIONGAP 8 10/18/2021   GFR 58.06 (L) 04/26/2021   Lab Results  Component Value Date   CHOL 222 (H) 01/04/2021   Lab Results  Component Value Date   HDL 68.30 01/04/2021   Lab Results  Component Value Date   LDLCALC 125 (H) 01/04/2021   Lab Results  Component Value Date   TRIG 146.0 01/04/2021   Lab Results  Component  Value Date   CHOLHDL 3 01/04/2021   Lab Results  Component Value Date   HGBA1C 6.1 (H) 11/18/2014   Mammogram- Last completed 03/18/2019. Results showed Probably benign masses/areas in the upper inner quadrant of the right breast consistent with prior history of significant trauma in this location. MM DIAG BREAST TOMO UNI RIGHT completed 09/16/2019. Results showed 1. Decreased posttraumatic/fat necrosis changes within the INNER RIGHT breast. 2. No mammographic evidence of RIGHT breast malignancy. Bilateral screening mammogram in January 2022 to resume annual mammogram schedule.  Dexa- Last completed 03/10/2020. Results show she is osteoporotic. Repeat in 2 years.   Colonoscopy- Last completed 05/06/2012. Results are normal. No repeat recommended.  Assessment & Plan:   Problem List Items Addressed This Visit       Unprioritized   Hypothyroidism   Relevant Orders   TSH   Essential hypertension   Relevant Medications   amLODipine (NORVASC) 10 MG tablet   Other Relevant Orders   CBC with Differential/Platelet   Comprehensive metabolic panel   Lipid panel   POCT Urinalysis Dipstick (Automated) (Completed)   Primary hypertension    Well controlled, no changes to meds. Encouraged heart healthy diet such as the DASH diet and exercise as tolerated.        Relevant Medications   amLODipine (NORVASC) 10 MG tablet   Hyperlipidemia    Encourage heart healthy diet such as MIND or DASH diet, increase exercise, avoid trans fats, simple carbohydrates and processed foods, consider a krill or fish or flaxseed oil cap daily.       Relevant Medications   amLODipine (NORVASC) 10 MG tablet   Other Visit Diagnoses     Preventative health care    -  Primary   Relevant Orders   CBC with Differential/Platelet   Comprehensive metabolic panel   Lipid panel   POCT Urinalysis Dipstick (Automated) (Completed)   Dysuria       Relevant Orders   Urine Culture   Leukocytes in urine        Relevant Orders   Urine Culture        Meds ordered this encounter  Medications   amLODipine (NORVASC) 10 MG tablet    Sig: TAKE 1 TABLET(10 MG) BY MOUTH DAILY    Dispense:  90 tablet    Refill:  1    I, [name], DO, personally preformed the services described in this documentation.  All medical record entries made by the scribe were at my direction and in my presence.  I have reviewed the chart and discharge instructions (if applicable) and agree that the record reflects my personal performance and is accurate and complete. 10/18/2021   I,Amanda [name],acting as a scribe for [name], DO.,have documented all relevant documentation on the behalf of [name], DO,as directed by  [name], DO while in the presence of [name], DO.   [name], DO

## 2021-10-18 NOTE — Progress Notes (Signed)
Hematology and Oncology Follow Up Visit  Amanda [name] [id number] 30-Sep-1940 81 y.o. 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 She is doing well but does note fatigue. She takes a break to rest when needed.   She has not noted any hot flashes or night sweats.  No blood loss, bruising or petechiae.  No fever, chills, n/v, cough, rash, dizziness, SOB, chest pain, palpitations, abdominal pain or changes in bowel or bladder habits.  No swelling, tenderness, numbness or tingling in her extremities.  She does have issues with her neck but has noted that since starting massage therapy her mobility is starting to improve.  No falls or syncope reported.  Appetite is good but she does admit that she needs to better hydrate throughout the day.   ECOG Performance Status: 0 - Asymptomatic  Medications:  Allergies as of 10/18/2021       Reactions   Phenergan [promethazine Hcl] Other (See Comments)   Jerking/agitation   Preservision Areds 2 [multiple Vitamins-minerals] Nausea Only   Levaquin [levofloxacin] Nausea And Vomiting   Pravastatin Nausea And Vomiting        Medication List        Accurate as of October 18, 2021  1:00 PM. If you have any questions, ask your nurse or doctor.          acetaminophen 325 MG tablet Commonly known as: TYLENOL Take 1-2 tablets (325-650 mg total) by mouth every 8 (eight) hours as needed for mild pain.   albuterol 108 (90 Base) MCG/ACT inhaler Commonly known as: VENTOLIN HFA INHALE 2 PUFFS INTO THE LUNGS EVERY 6 HOURS AS NEEDED FOR WHEEZING OR SHORTNESS OF BREATH   amLODipine 10 MG tablet Commonly known as: NORVASC TAKE 1 TABLET(10 MG) BY MOUTH DAILY   aspirin 81 MG  chewable tablet Chew 1 tablet (81 mg total) by mouth daily.   buPROPion 150 MG 24 hr tablet Commonly known as: WELLBUTRIN XL TAKE 1 TABLET(150 MG) BY MOUTH DAILY   levothyroxine 125 MCG tablet Commonly known as: SYNTHROID TAKE 1 TABLET(125 MCG) BY MOUTH DAILY BEFORE BREAKFAST   oxybutynin 10 MG 24 hr tablet Commonly known as: DITROPAN-XL Take 1 tablet (10 mg total) by mouth at bedtime.   pantoprazole 40 MG tablet Commonly known as: PROTONIX TAKE 1 TABLET(40 MG) BY MOUTH DAILY   senna-docusate 8.6-50 MG tablet Commonly known as: Senokot-S Take 2 tablets by mouth at bedtime.   Vitamin D3 Adult Gummies 25 MCG (1000 UT) Chew Generic drug: Cholecalciferol Chew 2 each by mouth daily.        Allergies:  Allergies  Allergen Reactions   Phenergan [Promethazine Hcl] Other (See Comments)    Jerking/agitation   Preservision Areds 2 [Multiple Vitamins-Minerals] Nausea Only   Levaquin [Levofloxacin] Nausea And Vomiting   Pravastatin Nausea And Vomiting    Past Medical History, Surgical history, Social history, and Family History were reviewed and updated.  Review of Systems: All other 10 point review of systems is negative.   Physical Exam:  vitals were not taken for this visit.   Wt Readings from Last 3 Encounters:  10/03/21 149 lb 3.2 oz (67.7 kg)  06/20/21  148 lb (67.1 kg)  06/12/21 145 lb (65.8 kg)    Ocular: Sclerae unicteric, pupils equal, round and reactive to light Ear-nose-throat: Oropharynx clear, dentition fair Lymphatic: No cervical or supraclavicular adenopathy Lungs no rales or rhonchi, good excursion bilaterally Heart regular rate and rhythm, no murmur appreciated Abd soft, nontender, positive bowel sounds MSK no focal spinal tenderness, no joint edema Neuro: non-focal, well-oriented, appropriate affect Breasts: Deferred   Lab Results  Component Value Date   WBC 8.2 06/20/2021   HGB 13.2 06/20/2021   HCT 40.9 06/20/2021   MCV 84.9 06/20/2021    PLT 245 06/20/2021   Lab Results  Component Value Date   FERRITIN 445 (H) 04/19/2021   IRON 21 (L) 04/19/2021   TIBC 321 04/19/2021   UIBC 300 04/19/2021   IRONPCTSAT 7 (L) 04/19/2021   Lab Results  Component Value Date   RBC 4.82 06/20/2021   No results found for: "KPAFRELGTCHN", "LAMBDASER", "KAPLAMBRATIO" Lab Results  Component Value Date   IGGSERUM 206 (L) 04/19/2021   IGA 19 (L) 04/19/2021   IGMSERUM <5 (L) 04/19/2021   No results found for: "TOTALPROTELP", "ALBUMINELP", "A1GS", "A2GS", "BETS", "BETA2SER", "GAMS", "MSPIKE", "SPEI"   Chemistry      Component Value Date/Time   NA 142 06/20/2021 1333   NA 146 (H) 05/26/2019 1108   K 4.9 06/20/2021 1333   CL 103 06/20/2021 1333   CO2 30 06/20/2021 1333   BUN 23 06/20/2021 1333   BUN 17 05/26/2019 1108   CREATININE 1.00 06/20/2021 1333   CREATININE 1.09 (H) 02/09/2021 1545   GLU 91 06/23/2018 0000      Component Value Date/Time   CALCIUM 10.0 06/20/2021 1333   CALCIUM 12.3 (H) 06/16/2018 1639   ALKPHOS 83 06/20/2021 1333   AST 16 06/20/2021 1333   ALT 15 06/20/2021 1333   BILITOT 0.3 06/20/2021 1333       Impression and Plan: Amanda [name] is a very charming 81 yo caucasian female with a transformed marginal zone lymphoma.  She completed maintenance Rituxan in November 2022. She continues to do well and so far there has been no evidence of recurrence.  We will repeat a PET scan if she becomes symptomatic.  Iron studies are pending. We will replace if needed.  Follow-up in 4 months.   Lottie Dawson, NP 8/17/20231:00 PM

## 2021-10-18 NOTE — Patient Instructions (Addendum)

## 2021-10-18 NOTE — Assessment & Plan Note (Signed)
Well controlled, no changes to meds. Encouraged heart healthy diet such as the DASH diet and exercise as tolerated.  °

## 2021-10-19 ENCOUNTER — Ambulatory Visit: Payer: Medicare Other | Admitting: Family

## 2021-10-19 ENCOUNTER — Inpatient Hospital Stay: Payer: Medicare Other

## 2021-10-19 LAB — URINE CULTURE
MICRO NUMBER:: 13794058
SPECIMEN QUALITY:: ADEQUATE

## 2021-10-22 ENCOUNTER — Telehealth: Payer: Self-pay | Admitting: *Deleted

## 2021-10-22 NOTE — Telephone Encounter (Signed)
Per 10/18/21 los - called and gave upcoming appointments - confirmed

## 2021-10-23 ENCOUNTER — Other Ambulatory Visit: Payer: Self-pay | Admitting: Family Medicine

## 2021-10-23 DIAGNOSIS — I10 Essential (primary) hypertension: Secondary | ICD-10-CM

## 2021-10-24 ENCOUNTER — Other Ambulatory Visit: Payer: Self-pay

## 2021-10-24 ENCOUNTER — Other Ambulatory Visit: Payer: Self-pay | Admitting: Family Medicine

## 2021-10-24 DIAGNOSIS — I10 Essential (primary) hypertension: Secondary | ICD-10-CM

## 2021-10-24 DIAGNOSIS — E785 Hyperlipidemia, unspecified: Secondary | ICD-10-CM

## 2021-10-24 MED ORDER — ROSUVASTATIN CALCIUM 5 MG PO TABS: ORAL_TABLET | ORAL | 1 refills | Status: DC

## 2021-11-01 ENCOUNTER — Ambulatory Visit: Payer: Self-pay | Admitting: *Deleted

## 2021-11-01 ENCOUNTER — Encounter: Payer: Self-pay | Admitting: *Deleted

## 2021-11-01 NOTE — Patient Instructions (Signed)
Visit Information  Thank you for taking time to visit with me today. Please don't hesitate to contact me if I can be of assistance to you.   Following are the goals we discussed today:   Goals Addressed             This Visit's Progress    COMPLETED: Care Coordination Activities: no follow up required   On track    Care Coordination Interventions: Evaluation of current treatment plan related to HLD and patient's adherence to plan as established by provider Advised patient to provide appropriate vaccination information to provider or CM team member at next visit Advised patient to discuss her symptoms of dry mouth with her pharmacy team/ outpatient pharmacist- explained there are OTC products available for dry mouth symptoms; encouraged her to discuss medications with pharmacy team to determine if one of her medications has this symptom as a side effect Provided education to patient re: value of following diet low in cholesterol- she is currently following mediterranean diet and managing portion sizes  Reviewed medications with patient and discussed possible side effects of recently prescribed statin- patient confirms she obta Reviewed scheduled/upcoming provider appointments including 11/21/21- clinic pharmacist; 02/19/22- oncology provider/ surveillance Advised patient to discuss any side effects from medications with provider Assessed social determinant of health barriers Encouraged patient to obtain flu vaccine for 2023-24 flu/ winter season; verified patient completed Medicare AWV on 06/12/21           If you are experiencing a [place] or [place] or need someone to talk to, please  call the Suicide and Crisis Lifeline: 988 call the [place] National Suicide Prevention Lifeline: 984-084-2477 or TTY: (661)326-6455 TTY 419-049-4260) to talk to a trained counselor call 1-800-273-TALK (toll free, 24 hour hotline) go to [facility]  [address] [place] [phone]) call the [place]: [phone] call 911   Patient verbalizes understanding of instructions and care plan provided today and agrees to view in [place]. Active MyChart status and patient understanding of how to access instructions and care plan via MyChart confirmed with patient.     No further follow up required: no ongoing care coordination needs identified  Oneta Rack, RN, BSN, CCRN Alumnus RN CM Care Coordination/ Transition of [place] [facility] [phone]: direct office

## 2021-11-01 NOTE — Patient Outreach (Signed)
  Care Coordination   Initial Visit Note   11/01/2021 Name: Amanda [name] MRN: [id number] DOB: 22-Oct-1940  Amanda [name] is a 81 y.o. year old female who sees Carollee Herter, Alferd Apa, DO for primary care. I spoke with  Amanda [name] by phone today.  What matters to the patients health and wellness today?  "I am doing well; I started taking the statin medication and it is going okay; my biggest issue right now is dry-mouth; I believe it is a side effect of one of my medications; other than that, I am doing well"  No further/ ongoing care coordination needs identified    Goals Addressed             This Visit's Progress    COMPLETED: Care Coordination Activities: no follow up required   On track    Care Coordination Interventions: Evaluation of current treatment plan related to HLD and patient's adherence to plan as established by provider Advised patient to provide appropriate vaccination information to provider or CM team member at next visit Advised patient to discuss her symptoms of dry mouth with her pharmacy team/ outpatient pharmacist- explained there are OTC products available for dry mouth symptoms; encouraged her to discuss medications with pharmacy team to determine if one of her medications has this symptom as a side effect Provided education to patient re: value of following diet low in cholesterol- she is currently following mediterranean diet and managing portion sizes  Reviewed medications with patient and discussed possible side effects of recently prescribed statin- patient confirms she obta Reviewed scheduled/upcoming provider appointments including 11/21/21- clinic pharmacist; 02/19/22- oncology provider/ surveillance Advised patient to discuss any side effects from medications with provider Assessed social determinant of health barriers Encouraged patient to obtain flu vaccine for 2023-24 flu/ winter season; verified patient completed Medicare AWV on 06/12/21            SDOH assessments and interventions completed:  Yes  SDOH Interventions Today    Flowsheet Row Most Recent Value  SDOH Interventions   Food Insecurity Interventions Intervention Not Indicated  Transportation Interventions Intervention Not Indicated  [Family provides transportation]       Care Coordination Interventions Activated:  Yes  Care Coordination Interventions:  Yes, provided   Follow up plan: No further intervention required.   Encounter Outcome:  Pt. Visit Completed   Oneta Rack, RN, BSN, CCRN Alumnus RN CM Care Coordination/ [facility] [place] [facility] [phone]: direct office

## 2021-11-06 ENCOUNTER — Other Ambulatory Visit: Payer: Self-pay | Admitting: Family Medicine

## 2021-11-19 ENCOUNTER — Telehealth: Payer: Self-pay | Admitting: Family Medicine

## 2021-11-19 NOTE — Telephone Encounter (Signed)
Pt needs to r/s her appt with Tammy on Wednesday.

## 2021-11-20 NOTE — Chronic Care Management (AMB) (Signed)
  Chronic Care Management Note  11/20/2021 Name: ANNAIS CRAFTS MRN: [id number] DOB: October 26, 1940  CLARIBEL SACHS is a 81 y.o. year old female who is a primary care patient of [name], DO and is actively engaged with the care management team. I reached out to [name] by phone today to assist with re-scheduling a follow up visit with the Pharmacist  Follow up plan: Telephone appointment with care management team member scheduled for: 11/27/2021  Julian Hy, [place] Direct Dial: [phone]

## 2021-11-21 ENCOUNTER — Telehealth: Payer: Medicare Other

## 2021-11-27 ENCOUNTER — Encounter: Payer: Self-pay | Admitting: Pharmacist

## 2021-11-27 ENCOUNTER — Ambulatory Visit: Payer: Medicare Other | Admitting: Pharmacist

## 2021-11-27 DIAGNOSIS — I10 Essential (primary) hypertension: Secondary | ICD-10-CM

## 2021-11-27 DIAGNOSIS — E785 Hyperlipidemia, unspecified: Secondary | ICD-10-CM

## 2021-11-27 NOTE — Patient Instructions (Addendum)
Hi Amanda [name],  Below is a summary of what we discussed today during our phone appointment:   Make sure to get your annual flu vaccine, either thru our office or at your local pharmacy. Also consider getting the updated COVID vaccine and Shingrix vaccine series. You can get these at a local pharmacy.   Restart exercise - goal is to work up to 150 minutes per week. Make sure to include weight bearing exercise to help with bone health.  I have also included information on how to calculate calcium intake from you diet and determine if and how much calcium supplement is needed                Exercise for Strong Bones  Exercise is important to build and maintain strong bones / bone density.  There are 2 types of exercises that are important to building and maintaining strong bones:  Weight- bearing and muscle-stregthening.  Weight-bearing Exercises  These exercises include activities that make you move against gravity while staying upright. Weight-bearing exercises can be high-impact or low-impact.  High-impact weight-bearing exercises help build bones and keep them strong. If you have broken a bone due to osteoporosis or are at risk of breaking a bone, you may need to avoid high-impact exercises. If you're not sure, you should check with your healthcare provider.  Examples of high-impact weight-bearing exercises are: Dancing  Doing high-impact aerobics  Hiking  Jogging/running  Jumping Rope  Stair climbing  Tennis  Low-impact weight-bearing exercises can also help keep bones strong and are a safe alternative if you cannot do high-impact exercises.   Examples of low-impact weight-bearing exercises are: Using elliptical training machines  Doing low-impact aerobics  Using stair-step machines  Fast walking on a treadmill or outside   Muscle-Strengthening Exercises These exercises include activities where you move your body, a weight or some other resistance against gravity. They are  also known as resistance exercises and include: Lifting weights  Using elastic exercise bands  Using weight machines  Lifting your own body weight  Functional movements, such as standing and rising up on your toes  Yoga and Pilates can also improve strength, balance and flexibility. However, certain positions may not be safe for people with osteoporosis or those at increased risk of broken bones. For example, exercises that have you bend forward may increase the chance of breaking a bone in the spine.   Non-Impact Exercises There are other types of exercises that can help prevent falls.  Non-impact exercises can help you to improve balance, posture and how well you move in everyday activities. Some of these exercises include: Balance exercises that strengthen your legs and test your balance, such as Tai Chi, can decrease your risk of falls.  Posture exercises that improve your posture and reduce rounded or "sloping" shoulders can help you decrease the chance of breaking a bone, especially in the spine.  Functional exercises that improve how well you move can help you with everyday activities and decrease your chance of falling and breaking a bone. For example, if you have trouble getting up from a chair or climbing stairs, you should do these activities as exercises.     Steps to Estimate Your Calcium Intake  The chart below will help you estimate the amount of calcium you get from food on a typical day. You'll also see how much more calcium you need to get each day from other food sources or supplements.  Calcium Calculator How to Estimate Your Daily  Calcium Intake Step 1: Estimate the number of servings you have on a typical day for each type of food. One serving is equal to approximately:  8 oz. or one cup of milk 6 oz. of yogurt 1 oz. or 1 cubic inch of cheese The amount of calcium in fortified foods and juices ranges from 80 - 1,000 mg. Some examples are juices, soymilk and  cereals.  Step 2: List the estimated number of servings of each food item under "Servings Per Day."  Step 3: Multiply the number of "Servings Per Day" by the number of milligrams (mg) under "Calcium." For example: if you have about two servings of milk per day, multiply 2 x 300 to get a total of 600 mg of calcium from milk.  Step 4: After you have calculated the total amount of calcium for each product, add these totals in the right hand column to get your Total Daily Calcium Intake. Note: 250 mg of calcium is automatically added under "Estimated total from other foods." Most of [place] get about this amount of calcium each day from other foods like broccoli.  Step 5: Subtract your final total daily calcium intake from the recommended amount of calcium you need each day. This number is the additional calcium you need each day. You can get this additional calcium by adding calcium-rich foods to your diet and/or by taking a calcium supplement.
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
## 2021-12-06 ENCOUNTER — Other Ambulatory Visit: Payer: Self-pay | Admitting: Family Medicine

## 2021-12-07 ENCOUNTER — Telehealth: Payer: Self-pay | Admitting: Family Medicine

## 2021-12-07 NOTE — Telephone Encounter (Signed)
Amanda [name] ([facility] Scre[facility])  called to follow up on reacquisition form progress. Rep was advised PCP was out of office and would not be able to sign it for some time. Rep asked if another provider could sign it. Advised note would be sent back to look into this. Rep acknowledged understanding.

## 2021-12-10 NOTE — Telephone Encounter (Signed)
Paperwork received but this will have to await upon Dr. Ivy Lynn return due to what they want.  Patient did request this and they do have an interpreter to give results to patient.

## 2021-12-10 NOTE — Telephone Encounter (Signed)
Called [facility] and spoke with [facility] and advised theme that we do not have the form and if she could resend it.  She is resending to my email and we will get signed to send back.

## 2021-12-13 NOTE — Telephone Encounter (Signed)
Form faxed and shayna stated they received.

## 2021-12-13 NOTE — Telephone Encounter (Signed)
Frances Furbish called back to remind Sheketia to get signature from Dr. Etter Sjogren since she was due to return today.

## 2021-12-16 DIAGNOSIS — J42 Unspecified chronic bronchitis: Secondary | ICD-10-CM | POA: Diagnosis not present

## 2021-12-16 DIAGNOSIS — Z8701 Personal history of pneumonia (recurrent): Secondary | ICD-10-CM | POA: Diagnosis not present

## 2021-12-16 DIAGNOSIS — D649 Anemia, unspecified: Secondary | ICD-10-CM | POA: Diagnosis not present

## 2022-01-11 DIAGNOSIS — Z86018 Personal history of other benign neoplasm: Secondary | ICD-10-CM | POA: Diagnosis not present

## 2022-01-11 DIAGNOSIS — L570 Actinic keratosis: Secondary | ICD-10-CM | POA: Diagnosis not present

## 2022-01-11 DIAGNOSIS — L821 Other seborrheic keratosis: Secondary | ICD-10-CM | POA: Diagnosis not present

## 2022-01-11 DIAGNOSIS — Z85828 Personal history of other malignant neoplasm of skin: Secondary | ICD-10-CM | POA: Diagnosis not present

## 2022-01-11 DIAGNOSIS — Z23 Encounter for immunization: Secondary | ICD-10-CM | POA: Diagnosis not present

## 2022-01-11 DIAGNOSIS — D2272 Melanocytic nevi of left lower limb, including hip: Secondary | ICD-10-CM | POA: Diagnosis not present

## 2022-01-11 DIAGNOSIS — L578 Other skin changes due to chronic exposure to nonionizing radiation: Secondary | ICD-10-CM | POA: Diagnosis not present

## 2022-01-11 DIAGNOSIS — D2371 Other benign neoplasm of skin of right lower limb, including hip: Secondary | ICD-10-CM | POA: Diagnosis not present

## 2022-02-07 ENCOUNTER — Other Ambulatory Visit (INDEPENDENT_AMBULATORY_CARE_PROVIDER_SITE_OTHER): Payer: Medicare Other

## 2022-02-07 DIAGNOSIS — E785 Hyperlipidemia, unspecified: Secondary | ICD-10-CM | POA: Diagnosis not present

## 2022-02-07 DIAGNOSIS — H40013 Open angle with borderline findings, low risk, bilateral: Secondary | ICD-10-CM | POA: Diagnosis not present

## 2022-02-07 DIAGNOSIS — H4423 Degenerative myopia, bilateral: Secondary | ICD-10-CM | POA: Diagnosis not present

## 2022-02-07 DIAGNOSIS — H353132 Nonexudative age-related macular degeneration, bilateral, intermediate dry stage: Secondary | ICD-10-CM | POA: Diagnosis not present

## 2022-02-07 DIAGNOSIS — H15833 Staphyloma posticum, bilateral: Secondary | ICD-10-CM | POA: Diagnosis not present

## 2022-02-07 DIAGNOSIS — I10 Essential (primary) hypertension: Secondary | ICD-10-CM | POA: Diagnosis not present

## 2022-02-07 LAB — COMPREHENSIVE METABOLIC PANEL
ALT: 15 U/L (ref 0–35)
AST: 16 U/L (ref 0–37)
Albumin: 4.3 g/dL (ref 3.5–5.2)
Alkaline Phosphatase: 78 U/L (ref 39–117)
BUN: 17 mg/dL (ref 6–23)
CO2: 32 mEq/L (ref 19–32)
Calcium: 9.2 mg/dL (ref 8.4–10.5)
Chloride: 102 mEq/L (ref 96–112)
Creatinine, Ser: 1.02 mg/dL (ref 0.40–1.20)
GFR: 51.01 mL/min — ABNORMAL LOW (ref >60.00)
Glucose, Bld: 89 mg/dL (ref 70–99)
Potassium: 4.2 mEq/L (ref 3.5–5.1)
Sodium: 141 mEq/L (ref 135–145)
Total Bilirubin: 0.4 mg/dL (ref 0.2–1.2)
Total Protein: 6 g/dL (ref 6.0–8.3)

## 2022-02-07 LAB — LIPID PANEL
Cholesterol: 201 mg/dL — ABNORMAL HIGH (ref 0–200)
HDL: 73.5 mg/dL (ref >39.00)
LDL Cholesterol: 111 mg/dL — ABNORMAL HIGH (ref 0–99)
NonHDL: 127.53
Total CHOL/HDL Ratio: 3
Triglycerides: 84 mg/dL (ref 0.0–149.0)
VLDL: 16.8 mg/dL (ref 0.0–40.0)

## 2022-02-19 ENCOUNTER — Inpatient Hospital Stay (HOSPITAL_BASED_OUTPATIENT_CLINIC_OR_DEPARTMENT_OTHER): Payer: Medicare Other | Admitting: Family

## 2022-02-19 ENCOUNTER — Inpatient Hospital Stay: Payer: Medicare Other | Attending: Hematology & Oncology

## 2022-02-19 ENCOUNTER — Encounter: Payer: Self-pay | Admitting: Family

## 2022-02-19 ENCOUNTER — Other Ambulatory Visit: Payer: Self-pay

## 2022-02-19 VITALS — BP 139/58 | HR 58 | Temp 98.0°F | Resp 16 | Ht 60.0 in | Wt 149.0 lb

## 2022-02-19 DIAGNOSIS — C8580 Other specified types of non-Hodgkin lymphoma, unspecified site: Secondary | ICD-10-CM

## 2022-02-19 DIAGNOSIS — D500 Iron deficiency anemia secondary to blood loss (chronic): Secondary | ICD-10-CM

## 2022-02-19 DIAGNOSIS — R202 Paresthesia of skin: Secondary | ICD-10-CM | POA: Insufficient documentation

## 2022-02-19 DIAGNOSIS — R200 Anesthesia of skin: Secondary | ICD-10-CM | POA: Insufficient documentation

## 2022-02-19 DIAGNOSIS — D509 Iron deficiency anemia, unspecified: Secondary | ICD-10-CM | POA: Diagnosis not present

## 2022-02-19 DIAGNOSIS — C8330 Diffuse large B-cell lymphoma, unspecified site: Secondary | ICD-10-CM | POA: Diagnosis present

## 2022-02-19 DIAGNOSIS — Z881 Allergy status to other antibiotic agents status: Secondary | ICD-10-CM | POA: Diagnosis not present

## 2022-02-19 DIAGNOSIS — Z888 Allergy status to other drugs, medicaments and biological substances status: Secondary | ICD-10-CM | POA: Insufficient documentation

## 2022-02-19 DIAGNOSIS — R0602 Shortness of breath: Secondary | ICD-10-CM | POA: Diagnosis not present

## 2022-02-19 DIAGNOSIS — Z79899 Other long term (current) drug therapy: Secondary | ICD-10-CM | POA: Insufficient documentation

## 2022-02-19 LAB — CBC WITH DIFFERENTIAL (CANCER CENTER ONLY)
Abs Immature Granulocytes: 0.02 10*3/uL (ref 0.00–0.07)
Basophils Absolute: 0.1 10*3/uL (ref 0.0–0.1)
Basophils Relative: 1 %
Eosinophils Absolute: 0.3 10*3/uL (ref 0.0–0.5)
Eosinophils Relative: 5 %
HCT: 41.1 % (ref 36.0–46.0)
Hemoglobin: 13.6 g/dL (ref 12.0–15.0)
Immature Granulocytes: 0 %
Lymphocytes Relative: 31 %
Lymphs Abs: 2.1 10*3/uL (ref 0.7–4.0)
MCH: 28.3 pg (ref 26.0–34.0)
MCHC: 33.1 g/dL (ref 30.0–36.0)
MCV: 85.4 fL (ref 80.0–100.0)
Monocytes Absolute: 0.7 10*3/uL (ref 0.1–1.0)
Monocytes Relative: 10 %
Neutro Abs: 3.6 10*3/uL (ref 1.7–7.7)
Neutrophils Relative %: 53 %
Platelet Count: 242 10*3/uL (ref 150–400)
RBC: 4.81 MIL/uL (ref 3.87–5.11)
RDW: 13.6 % (ref 11.5–15.5)
WBC Count: 6.8 10*3/uL (ref 4.0–10.5)
nRBC: 0 % (ref 0.0–0.2)

## 2022-02-19 LAB — LACTATE DEHYDROGENASE: LDH: 87 U/L — ABNORMAL LOW (ref 98–192)

## 2022-02-19 LAB — FERRITIN: Ferritin: 413 ng/mL — ABNORMAL HIGH (ref 11–307)

## 2022-02-19 LAB — CMP (CANCER CENTER ONLY)
ALT: 17 U/L (ref 0–44)
AST: 20 U/L (ref 15–41)
Albumin: 4.1 g/dL (ref 3.5–5.0)
Alkaline Phosphatase: 79 U/L (ref 38–126)
Anion gap: 7 (ref 5–15)
BUN: 18 mg/dL (ref 8–23)
CO2: 29 mmol/L (ref 22–32)
Calcium: 9.4 mg/dL (ref 8.9–10.3)
Chloride: 105 mmol/L (ref 98–111)
Creatinine: 1.02 mg/dL — ABNORMAL HIGH (ref 0.44–1.00)
GFR, Estimated: 55 mL/min — ABNORMAL LOW (ref >60)
Glucose, Bld: 114 mg/dL — ABNORMAL HIGH (ref 70–99)
Potassium: 3.9 mmol/L (ref 3.5–5.1)
Sodium: 141 mmol/L (ref 135–145)
Total Bilirubin: 0.4 mg/dL (ref 0.3–1.2)
Total Protein: 6.5 g/dL (ref 6.5–8.1)

## 2022-02-19 NOTE — Progress Notes (Signed)
Hematology and Oncology Follow Up Visit  Amanda [name] [id number] 08/22/1940 81 y.o. 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 She is doing well and has no complaints at this time.  She notes mild SOB with over exertion.  She has not had any issues with infection. No fever, chills, n/v, cough, rash, dizziness, chest pain, palpitations, abdominal pain or changes in bowel or bladder habits.  No blood loss, bruising or petechiae noted.  No hot flashes or night sweats.  No swelling or tenderness in her extremities at this time.  She states that she has positional numbness and tingling in th left arm and hand.  No falls or syncope.  Appetite and hydration are good. Weight is stable at 149 lbs.   ECOG Performance Status: 1 - Symptomatic but completely ambulatory  Medications:  Allergies as of 02/19/2022       Reactions   Phenergan [promethazine Hcl] Other (See Comments)   Jerking/agitation   Preservision Areds 2 [multiple Vitamins-minerals] Nausea Only   Levaquin [levofloxacin] Nausea And Vomiting   Pravastatin Nausea And Vomiting        Medication List        Accurate as of February 19, 2022  2:17 PM. If you have any questions, ask your nurse or doctor.          acetaminophen 325 MG tablet Commonly known as: TYLENOL Take 1-2 tablets (325-650 mg total) by mouth every 8 (eight) hours as needed for mild pain.   albuterol 108 (90 Base) MCG/ACT inhaler Commonly known as: VENTOLIN HFA INHALE 2 PUFFS INTO THE LUNGS EVERY 6 HOURS AS NEEDED FOR WHEEZING OR SHORTNESS OF BREATH   amLODipine 10 MG tablet Commonly known as: NORVASC TAKE 1 TABLET(10 MG) BY MOUTH DAILY   aspirin 81 MG chewable  tablet Chew 1 tablet (81 mg total) by mouth daily.   buPROPion 150 MG 24 hr tablet Commonly known as: WELLBUTRIN XL TAKE 1 TABLET(150 MG) BY MOUTH DAILY   levothyroxine 125 MCG tablet Commonly known as: SYNTHROID TAKE 1 TABLET(125 MCG) BY MOUTH DAILY BEFORE BREAKFAST   oxybutynin 10 MG 24 hr tablet Commonly known as: DITROPAN-XL TAKE 1 TABLET(10 MG) BY MOUTH AT BEDTIME   pantoprazole 40 MG tablet Commonly known as: PROTONIX TAKE 1 TABLET(40 MG) BY MOUTH DAILY   rosuvastatin 5 MG tablet Commonly known as: Crestor Take 1 tablet by mouth on Monday, Wednesday, and Friday   senna-docusate 8.6-50 MG tablet Commonly known as: Senokot-S Take 2 tablets by mouth at bedtime.   Vitamin D3 Adult Gummies 25 MCG (1000 UT) Chew Generic drug: Cholecalciferol Chew 2 each by mouth daily.        Allergies:  Allergies  Allergen Reactions   Phenergan [Promethazine Hcl] Other (See Comments)    Jerking/agitation   Preservision Areds 2 [Multiple Vitamins-Minerals] Nausea Only   Levaquin [Levofloxacin] Nausea And Vomiting   Pravastatin Nausea And Vomiting    Past Medical History, Surgical history, Social history, and Family History were reviewed and updated.  Review of Systems: All other 10 point review of systems is negative.   Physical Exam:  vitals were not taken for this visit.  Wt Readings from Last 3 Encounters:  10/18/21 150 lb (68 kg)  10/18/21 149 lb 1.3 oz (67.6 kg)  10/03/21 149 lb 3.2 oz (67.7 kg)    Ocular: Sclerae unicteric, pupils equal, round and reactive to light Ear-nose-throat: Oropharynx clear, dentition fair Lymphatic: No cervical or supraclavicular adenopathy Lungs no rales or rhonchi, good excursion bilaterally Heart regular rate and rhythm, no murmur appreciated Abd soft, nontender, positive bowel sounds MSK no focal spinal tenderness, no joint edema Neuro: non-focal, well-oriented, appropriate affect Breasts: Deferred   Lab Results  Component  Value Date   WBC 6.8 02/19/2022   HGB 13.6 02/19/2022   HCT 41.1 02/19/2022   MCV 85.4 02/19/2022   PLT 242 02/19/2022   Lab Results  Component Value Date   FERRITIN 450 (H) 10/18/2021   IRON 102 10/18/2021   TIBC 304 10/18/2021   UIBC 202 10/18/2021   IRONPCTSAT 34 (H) 10/18/2021   Lab Results  Component Value Date   RBC 4.81 02/19/2022   No results found for: "KPAFRELGTCHN", "LAMBDASER", "KAPLAMBRATIO" Lab Results  Component Value Date   IGGSERUM 206 (L) 04/19/2021   IGA 19 (L) 04/19/2021   IGMSERUM <5 (L) 04/19/2021   No results found for: "TOTALPROTELP", "ALBUMINELP", "A1GS", "A2GS", "BETS", "BETA2SER", "GAMS", "MSPIKE", "SPEI"   Chemistry      Component Value Date/Time   NA 141 02/07/2022 0911   NA 146 (H) 05/26/2019 1108   K 4.2 02/07/2022 0911   CL 102 02/07/2022 0911   CO2 32 02/07/2022 0911   BUN 17 02/07/2022 0911   BUN 17 05/26/2019 1108   CREATININE 1.02 02/07/2022 0911   CREATININE 1.07 (H) 10/18/2021 1249   CREATININE 1.09 (H) 02/09/2021 1545   GLU 91 06/23/2018 0000      Component Value Date/Time   CALCIUM 9.2 02/07/2022 0911   CALCIUM 12.3 (H) 06/16/2018 1639   ALKPHOS 78 02/07/2022 0911   AST 16 02/07/2022 0911   AST 13 (L) 10/18/2021 1249   ALT 15 02/07/2022 0911   ALT 11 10/18/2021 1249   BILITOT 0.4 02/07/2022 0911   BILITOT 0.4 10/18/2021 1249       Impression and Plan: Amanda [name] is a very charming 81 yo caucasian female with a transformed marginal zone lymphoma. She completed maintenance Rituxan in November 2022. She continues to do well and so far there has been no evidence of recurrence.  We will repeat a PET scan if she becomes symptomatic.  Iron studies are pending. We will replace if needed.  Follow-up in 4 months.   Lottie Dawson, NP 12/19/20232:17 PM

## 2022-02-20 LAB — IRON AND IRON BINDING CAPACITY (CC-WL,HP ONLY)
Iron: 75 ug/dL (ref 28–170)
Saturation Ratios: 23 % (ref 10.4–31.8)
TIBC: 322 ug/dL (ref 250–450)
UIBC: 247 ug/dL (ref 148–442)

## 2022-03-12 ENCOUNTER — Encounter: Payer: Self-pay | Admitting: Hematology & Oncology

## 2022-03-12 ENCOUNTER — Ambulatory Visit: Payer: Medicare Other | Admitting: Family Medicine

## 2022-03-18 ENCOUNTER — Encounter: Payer: Self-pay | Admitting: Family Medicine

## 2022-04-11 ENCOUNTER — Other Ambulatory Visit: Payer: Self-pay | Admitting: Family Medicine

## 2022-04-25 DIAGNOSIS — K080 Exfoliation of teeth due to systemic causes: Secondary | ICD-10-CM | POA: Diagnosis not present

## 2022-05-06 ENCOUNTER — Telehealth: Payer: Self-pay | Admitting: Family Medicine

## 2022-05-06 NOTE — Telephone Encounter (Signed)
Pt called to reschedule appt for tomorrow; appt canceled. Please call to reschedule.

## 2022-05-06 NOTE — Telephone Encounter (Signed)
Appointment rescheduled for 05/15/2022 at 3pm

## 2022-05-07 ENCOUNTER — Telehealth: Payer: Medicare Other

## 2022-05-14 ENCOUNTER — Encounter: Payer: Self-pay | Admitting: Hematology & Oncology

## 2022-05-14 ENCOUNTER — Encounter: Payer: Self-pay | Admitting: Family Medicine

## 2022-05-14 ENCOUNTER — Ambulatory Visit (INDEPENDENT_AMBULATORY_CARE_PROVIDER_SITE_OTHER): Payer: Medicare Other | Admitting: Family Medicine

## 2022-05-14 VITALS — BP 130/74 | HR 60 | Temp 98.3°F | Resp 18 | Ht 60.0 in | Wt 147.4 lb

## 2022-05-14 DIAGNOSIS — I10 Essential (primary) hypertension: Secondary | ICD-10-CM | POA: Diagnosis not present

## 2022-05-14 DIAGNOSIS — E039 Hypothyroidism, unspecified: Secondary | ICD-10-CM

## 2022-05-14 DIAGNOSIS — E785 Hyperlipidemia, unspecified: Secondary | ICD-10-CM

## 2022-05-14 DIAGNOSIS — Z0000 Encounter for general adult medical examination without abnormal findings: Secondary | ICD-10-CM | POA: Diagnosis not present

## 2022-05-14 DIAGNOSIS — E2839 Other primary ovarian failure: Secondary | ICD-10-CM

## 2022-05-14 DIAGNOSIS — N393 Stress incontinence (female) (male): Secondary | ICD-10-CM | POA: Diagnosis not present

## 2022-05-14 MED ORDER — MIRABEGRON ER 50 MG PO TB24: 50.0000 mg | ORAL_TABLET | Freq: Every day | ORAL | 5 refills | Status: DC

## 2022-05-14 NOTE — Patient Instructions (Signed)
Preventive Care 65 Years and Older, Female Preventive care refers to lifestyle choices and visits with your health care provider that can promote health and wellness. Preventive care visits are also called wellness exams. What can I expect for my preventive care visit? Counseling Your health care provider may ask you questions about your: Medical history, including: Past medical problems. Family medical history. Pregnancy and menstrual history. History of falls. Current health, including: Memory and ability to understand (cognition). Emotional well-being. Home life and relationship well-being. Sexual activity and sexual health. Lifestyle, including: Alcohol, nicotine or tobacco, and drug use. Access to firearms. Diet, exercise, and sleep habits. Work and work environment. Sunscreen use. Safety issues such as seatbelt and bike helmet use. Physical exam Your health care provider will check your: Height and weight. These may be used to calculate your BMI (body mass index). BMI is a measurement that tells if you are at a healthy weight. Waist circumference. This measures the distance around your waistline. This measurement also tells if you are at a healthy weight and may help predict your risk of certain diseases, such as type 2 diabetes and high blood pressure. Heart rate and blood pressure. Body temperature. Skin for abnormal spots. What immunizations do I need?  Vaccines are usually given at various ages, according to a schedule. Your health care provider will recommend vaccines for you based on your age, medical history, and lifestyle or other factors, such as travel or where you work. What tests do I need? Screening Your health care provider may recommend screening tests for certain conditions. This may include: Lipid and cholesterol levels. Hepatitis C test. Hepatitis B test. HIV (human immunodeficiency virus) test. STI (sexually transmitted infection) testing, if you are at  risk. Lung cancer screening. Colorectal cancer screening. Diabetes screening. This is done by checking your blood sugar (glucose) after you have not eaten for a while (fasting). Mammogram. Talk with your health care provider about how often you should have regular mammograms. BRCA-related cancer screening. This may be done if you have a family history of breast, ovarian, tubal, or peritoneal cancers. Bone density scan. This is done to screen for osteoporosis. Talk with your health care provider about your test results, treatment options, and if necessary, the need for more tests. Follow these instructions at home: Eating and drinking  Eat a diet that includes fresh fruits and vegetables, whole grains, lean protein, and low-fat dairy products. Limit your intake of foods with high amounts of sugar, saturated fats, and salt. Take vitamin and mineral supplements as recommended by your health care provider. Do not drink alcohol if your health care provider tells you not to drink. If you drink alcohol: Limit how much you have to 0-1 drink a day. Know how much alcohol is in your drink. In the [place] one drink equals one 12 oz bottle of beer (355 mL), one 5 oz glass of wine (148 mL), or one 1 oz glass of hard liquor (44 mL). Lifestyle Brush your teeth every morning and night with fluoride toothpaste. Floss one time each day. Exercise for at least 30 minutes 5 or more days each week. Do not use any products that contain nicotine or tobacco. These products include cigarettes, chewing tobacco, and vaping devices, such as e-cigarettes. If you need help quitting, ask your health care provider. Do not use drugs. If you are sexually active, practice safe sex. Use a condom or other form of protection in order to prevent STIs. Take aspirin only as told by   your health care provider. Make sure that you understand how much to take and what form to take. Work with your health care provider to find out whether it  is safe and beneficial for you to take aspirin daily. Ask your health care provider if you need to take a cholesterol-lowering medicine (statin). Find healthy ways to manage stress, such as: Meditation, yoga, or listening to music. Journaling. Talking to a trusted person. Spending time with friends and family. Minimize exposure to UV radiation to reduce your risk of skin cancer. Safety Always wear your seat belt while driving or riding in a vehicle. Do not drive: If you have been drinking alcohol. Do not ride with someone who has been drinking. When you are tired or distracted. While texting. If you have been using any mind-altering substances or drugs. Wear a helmet and other protective equipment during sports activities. If you have firearms in your house, make sure you follow all gun safety procedures. What's next? Visit your health care provider once a year for an annual wellness visit. Ask your health care provider how often you should have your eyes and teeth checked. Stay up to date on all vaccines. This information is not intended to replace advice given to you by your health care provider. Make sure you discuss any questions you have with your health care provider. Document Revised: 08/16/2020 Document Reviewed: 08/16/2020 Elsevier Patient Education  2023 Elsevier Inc.   

## 2022-05-14 NOTE — Progress Notes (Signed)
Subjective:     Amanda [name] is a 82 y.o. female and is here for a comprehensive physical exam. The patient reports {problems:16946}.  Social History   Socioeconomic History   Marital status: Married    Spouse name: Not on file   Number of children: Not on file   Years of education: Not on file   Highest education level: Not on file  Occupational History   Not on file  Tobacco Use   Smoking status: Never   Smokeless tobacco: Never  Vaping Use   Vaping Use: Never used  Substance and Sexual Activity   Alcohol use: Yes    Comment: RARE   Drug use: Never   Sexual activity: Never    Birth control/protection: Post-menopausal  Other Topics Concern   Not on file  Social History Narrative   ** Merged History Encounter **       ** Merged History Encounter **       Social Determinants of Health   Financial Resource Strain: Medium Risk (05/04/2021)   Overall Financial Resource Strain (CARDIA)    Difficulty of Paying Living Expenses: Somewhat hard  Food Insecurity: No Food Insecurity (11/01/2021)   Hunger Vital Sign    Worried About Running Out of Food in the Last Year: Never true    Ran Out of Food in the Last Year: Never true  Transportation Needs: No Transportation Needs (11/01/2021)   PRAPARE - Hydrologist (Medical): No    Lack of Transportation (Non-Medical): No  Physical Activity: Insufficiently Active (11/27/2021)   Exercise Vital Sign    Days of Exercise per Week: 1 day    Minutes of Exercise per Session: 30 min  Stress: No Stress Concern Present (06/12/2021)   [place]    Feeling of Stress : Not at all  Social Connections: [place] (06/12/2021)   Social Connection and Isolation Panel [NHANES]    Frequency of Communication with Friends and Family: More than three times a week    Frequency of Social Gatherings with Friends and Family: More than three times a week     Attends Religious Services: More than 4 times per year    Active Member of [employer] or Organizations: Yes    Attends Archivist Meetings: More than 4 times per year    Marital Status: Married  Human resources officer Violence: Not At Risk (06/12/2021)   Humiliation, Afraid, Rape, and Kick questionnaire    Fear of Current or Ex-Partner: No    Emotionally Abused: No    Physically Abused: No    Sexually Abused: No   Health Maintenance  Topic Date Due   Zoster Vaccines- Shingrix (1 of 2) Never done   COVID-19 Vaccine (4 - 2023-24 season) 11/02/2021   Medicare Annual Wellness (AWV)  06/13/2022   DTaP/Tdap/Td (3 - Td or Tdap) 12/11/2028   Pneumonia Vaccine 66+ Years old  Completed   INFLUENZA VACCINE  Completed   DEXA SCAN  Completed   HPV VACCINES  Aged Out    {Common ambulatory SmartLinks:19316}  Review of Systems {ros; complete:30496}   Objective:    {Exam, Complete:[id number]}    Assessment:    Healthy female exam. ***     Plan:     See After Visit Summary for Counseling Recommendations

## 2022-05-15 ENCOUNTER — Ambulatory Visit (INDEPENDENT_AMBULATORY_CARE_PROVIDER_SITE_OTHER): Payer: Medicare Other | Admitting: Pharmacist

## 2022-05-15 DIAGNOSIS — N393 Stress incontinence (female) (male): Secondary | ICD-10-CM

## 2022-05-15 DIAGNOSIS — E785 Hyperlipidemia, unspecified: Secondary | ICD-10-CM

## 2022-05-15 LAB — COMPREHENSIVE METABOLIC PANEL
ALT: 11 U/L (ref 0–35)
AST: 13 U/L (ref 0–37)
Albumin: 3.9 g/dL (ref 3.5–5.2)
Alkaline Phosphatase: 82 U/L (ref 39–117)
BUN: 16 mg/dL (ref 6–23)
CO2: 29 mEq/L (ref 19–32)
Calcium: 9.2 mg/dL (ref 8.4–10.5)
Chloride: 102 mEq/L (ref 96–112)
Creatinine, Ser: 1.34 mg/dL — ABNORMAL HIGH (ref 0.40–1.20)
GFR: 36.7 mL/min — ABNORMAL LOW (ref >60.00)
Glucose, Bld: 98 mg/dL (ref 70–99)
Potassium: 4.5 mEq/L (ref 3.5–5.1)
Sodium: 140 mEq/L (ref 135–145)
Total Bilirubin: 0.4 mg/dL (ref 0.2–1.2)
Total Protein: 6 g/dL (ref 6.0–8.3)

## 2022-05-15 LAB — CBC WITH DIFFERENTIAL/PLATELET
Basophils Absolute: 0.1 10*3/uL (ref 0.0–0.1)
Basophils Relative: 0.8 % (ref 0.0–3.0)
Eosinophils Absolute: 0.4 10*3/uL (ref 0.0–0.7)
Eosinophils Relative: 4.1 % (ref 0.0–5.0)
HCT: 39.2 % (ref 36.0–46.0)
Hemoglobin: 13.1 g/dL (ref 12.0–15.0)
Lymphocytes Relative: 21.6 % (ref 12.0–46.0)
Lymphs Abs: 1.9 10*3/uL (ref 0.7–4.0)
MCHC: 33.3 g/dL (ref 30.0–36.0)
MCV: 83.5 fl (ref 78.0–100.0)
Monocytes Absolute: 1.2 10*3/uL — ABNORMAL HIGH (ref 0.1–1.0)
Monocytes Relative: 12.9 % — ABNORMAL HIGH (ref 3.0–12.0)
Neutro Abs: 5.4 10*3/uL (ref 1.4–7.7)
Neutrophils Relative %: 60.6 % (ref 43.0–77.0)
Platelets: 284 10*3/uL (ref 150.0–400.0)
RBC: 4.7 Mil/uL (ref 3.87–5.11)
RDW: 14.3 % (ref 11.5–15.5)
WBC: 8.9 10*3/uL (ref 4.0–10.5)

## 2022-05-15 LAB — LIPID PANEL
Cholesterol: 169 mg/dL (ref 0–200)
HDL: 66.5 mg/dL (ref >39.00)
LDL Cholesterol: 82 mg/dL (ref 0–99)
NonHDL: 102.73
Total CHOL/HDL Ratio: 3
Triglycerides: 102 mg/dL (ref 0.0–149.0)
VLDL: 20.4 mg/dL (ref 0.0–40.0)

## 2022-05-15 LAB — TSH: TSH: 4.03 u[IU]/mL (ref 0.35–5.50)
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
## 2022-05-16 ENCOUNTER — Other Ambulatory Visit: Payer: Self-pay | Admitting: Family Medicine

## 2022-05-16 DIAGNOSIS — N289 Disorder of kidney and ureter, unspecified: Secondary | ICD-10-CM

## 2022-06-06 ENCOUNTER — Telehealth: Payer: Self-pay | Admitting: Family Medicine

## 2022-06-06 NOTE — Telephone Encounter (Signed)
Copied from [place] [id number]. Topic: Medicare AWV >> Jun 06, 2022  9:49 AM Devoria Glassing wrote: Reason for CRM: Called patient to schedule Medicare Annual Wellness Visit (AWV). Left message for patient to call back and schedule Medicare Annual Wellness Visit (AWV).  Last date of AWV: 06/12/21  Please schedule an appointment at any time with Brayton El, LPN .  If any questions, please contact me.  Thank you ,  Sherol Dade; [place] Direct Dial: [phone]

## 2022-06-10 ENCOUNTER — Ambulatory Visit (HOSPITAL_BASED_OUTPATIENT_CLINIC_OR_DEPARTMENT_OTHER)
Admission: RE | Admit: 2022-06-10 | Discharge: 2022-06-10 | Disposition: A | Discharge status: Home / Self Care | Payer: Medicare Other | Source: Ambulatory Visit | Attending: Family Medicine | Admitting: Family Medicine

## 2022-06-10 DIAGNOSIS — E2839 Other primary ovarian failure: Secondary | ICD-10-CM | POA: Insufficient documentation

## 2022-06-10 DIAGNOSIS — M85851 Other specified disorders of bone density and structure, right thigh: Secondary | ICD-10-CM | POA: Diagnosis not present

## 2022-06-10 DIAGNOSIS — M85832 Other specified disorders of bone density and structure, left forearm: Secondary | ICD-10-CM | POA: Diagnosis not present

## 2022-06-21 ENCOUNTER — Ambulatory Visit: Payer: Medicare Other | Admitting: Hematology & Oncology

## 2022-06-21 ENCOUNTER — Other Ambulatory Visit: Payer: Medicare Other

## 2022-06-26 ENCOUNTER — Other Ambulatory Visit: Payer: Self-pay

## 2022-06-26 ENCOUNTER — Encounter: Payer: Self-pay | Admitting: Hematology & Oncology

## 2022-06-26 ENCOUNTER — Telehealth: Payer: Self-pay

## 2022-06-26 DIAGNOSIS — R42 Dizziness and giddiness: Secondary | ICD-10-CM

## 2022-06-26 NOTE — Telephone Encounter (Signed)
Spoke with patient. Referral placed to [facility] rehab at [facility]. Appt scheduled for Friday with [place].

## 2022-06-26 NOTE — Telephone Encounter (Signed)
Pt called wanting a referral for PT due to vertigo. Pt states being seen for vertigo about a year ago. She was advised that she may need to be seen again but pt states PT would be able to get her in today or tomorrow with an urgent referral. PT states vertigo started yesterday morning. Her current blood pressure was 166/94. She has not taken her blood pressure meds today. She states the vertigo did not improve yesterday after taking her medication. Pt reports vertigo and some blurred vision and no other sxs. Pt advised to go the ER if sxs worsen. Please advise

## 2022-06-28 ENCOUNTER — Ambulatory Visit (INDEPENDENT_AMBULATORY_CARE_PROVIDER_SITE_OTHER): Payer: Medicare Other | Admitting: Family Medicine

## 2022-06-28 ENCOUNTER — Encounter: Payer: Self-pay | Admitting: Family Medicine

## 2022-06-28 VITALS — BP 132/90 | HR 80 | Temp 98.1°F | Resp 16 | Ht 60.0 in | Wt 145.6 lb

## 2022-06-28 DIAGNOSIS — N3946 Mixed incontinence: Secondary | ICD-10-CM | POA: Diagnosis not present

## 2022-06-28 DIAGNOSIS — G8929 Other chronic pain: Secondary | ICD-10-CM

## 2022-06-28 DIAGNOSIS — E039 Hypothyroidism, unspecified: Secondary | ICD-10-CM | POA: Diagnosis not present

## 2022-06-28 DIAGNOSIS — M549 Dorsalgia, unspecified: Secondary | ICD-10-CM | POA: Diagnosis not present

## 2022-06-28 DIAGNOSIS — R200 Anesthesia of skin: Secondary | ICD-10-CM | POA: Insufficient documentation

## 2022-06-28 DIAGNOSIS — M542 Cervicalgia: Secondary | ICD-10-CM

## 2022-06-28 DIAGNOSIS — R202 Paresthesia of skin: Secondary | ICD-10-CM

## 2022-06-28 DIAGNOSIS — D501 Sideropenic dysphagia: Secondary | ICD-10-CM

## 2022-06-28 DIAGNOSIS — R350 Frequency of micturition: Secondary | ICD-10-CM | POA: Diagnosis not present

## 2022-06-28 DIAGNOSIS — G5602 Carpal tunnel syndrome, left upper limb: Secondary | ICD-10-CM

## 2022-06-28 DIAGNOSIS — N390 Urinary tract infection, site not specified: Secondary | ICD-10-CM

## 2022-06-28 LAB — POC URINALSYSI DIPSTICK (AUTOMATED)
Blood, UA: NEGATIVE
Glucose, UA: NEGATIVE
Nitrite, UA: POSITIVE
Protein, UA: POSITIVE — AB
Spec Grav, UA: 1.02 (ref 1.010–1.025)
Urobilinogen, UA: 0.2 E.U./dL
pH, UA: 5 (ref 5.0–8.0)

## 2022-06-28 LAB — COMPREHENSIVE METABOLIC PANEL
ALT: 13 U/L (ref 0–35)
AST: 15 U/L (ref 0–37)
Albumin: 4.4 g/dL (ref 3.5–5.2)
Alkaline Phosphatase: 77 U/L (ref 39–117)
BUN: 24 mg/dL — ABNORMAL HIGH (ref 6–23)
CO2: 29 mEq/L (ref 19–32)
Calcium: 9.7 mg/dL (ref 8.4–10.5)
Chloride: 103 mEq/L (ref 96–112)
Creatinine, Ser: 0.98 mg/dL (ref 0.40–1.20)
GFR: 53.38 mL/min — ABNORMAL LOW (ref >60.00)
Glucose, Bld: 94 mg/dL (ref 70–99)
Potassium: 4.4 mEq/L (ref 3.5–5.1)
Sodium: 141 mEq/L (ref 135–145)
Total Bilirubin: 0.4 mg/dL (ref 0.2–1.2)
Total Protein: 6.5 g/dL (ref 6.0–8.3)

## 2022-06-28 LAB — CBC WITH DIFFERENTIAL/PLATELET
Basophils Absolute: 0.1 10*3/uL (ref 0.0–0.1)
Basophils Relative: 0.8 % (ref 0.0–3.0)
Eosinophils Absolute: 0.3 10*3/uL (ref 0.0–0.7)
Eosinophils Relative: 4.9 % (ref 0.0–5.0)
HCT: 40.7 % (ref 36.0–46.0)
Hemoglobin: 13.6 g/dL (ref 12.0–15.0)
Lymphocytes Relative: 26.9 % (ref 12.0–46.0)
Lymphs Abs: 1.9 10*3/uL (ref 0.7–4.0)
MCHC: 33.4 g/dL (ref 30.0–36.0)
MCV: 83.1 fl (ref 78.0–100.0)
Monocytes Absolute: 0.9 10*3/uL (ref 0.1–1.0)
Monocytes Relative: 12 % (ref 3.0–12.0)
Neutro Abs: 4 10*3/uL (ref 1.4–7.7)
Neutrophils Relative %: 55.4 % (ref 43.0–77.0)
Platelets: 281 10*3/uL (ref 150.0–400.0)
RBC: 4.9 Mil/uL (ref 3.87–5.11)
RDW: 14.9 % (ref 11.5–15.5)
WBC: 7.2 10*3/uL (ref 4.0–10.5)

## 2022-06-28 LAB — TSH: TSH: 2.71 u[IU]/mL (ref 0.35–5.50)

## 2022-06-28 LAB — VITAMIN B12: Vitamin B-12: 379 pg/mL (ref 211–911)

## 2022-06-28 LAB — VITAMIN D 25 HYDROXY (VIT D DEFICIENCY, FRACTURES): VITD: 37.45 ng/mL (ref 30.00–100.00)

## 2022-06-28 MED ORDER — NITROFURANTOIN MONOHYD MACRO 100 MG PO CAPS
100.0000 mg | ORAL_CAPSULE | Freq: Two times a day (BID) | ORAL | 0 refills | Status: DC
Start: 2022-06-28 — End: 2022-07-11

## 2022-06-28 NOTE — Assessment & Plan Note (Signed)
Comes and goes  Maybe from previous fracture in low back  Pt does not want to see a specialist now  She will call if it worsens or she changes her mind

## 2022-06-28 NOTE — Assessment & Plan Note (Signed)
Pt had surgery years ago Symptoms are back-- pt comes and goes  Encouraged pt to wear her splint and if no relief she will call for referral  Numbess and tingling may be from previous cervical fracture as well  Pt will call if symptoms worsen

## 2022-06-28 NOTE — Patient Instructions (Signed)
How to Perform the Epley Maneuver The Epley maneuver is an exercise that relieves symptoms of vertigo. Vertigo is the feeling that you or your surroundings are moving when they are not. When you feel vertigo, you may feel like the room is spinning and may have trouble walking. The Epley maneuver is used for a type of vertigo caused by a calcium deposit in a part of the inner ear. The maneuver involves changing head positions to help the deposit move out of the area. You can do this maneuver at home whenever you have symptoms of vertigo. You can repeat it in 24 hours if your vertigo has not gone away. Even though the Epley maneuver may relieve your vertigo for a few weeks, it is possible that your symptoms will return. This maneuver relieves vertigo, but it does not relieve dizziness. What are the risks? If it is done correctly, the Epley maneuver is considered safe. Sometimes it can lead to dizziness or nausea that goes away after a short time. If you develop other symptoms--such as changes in vision, weakness, or numbness--stop doing the maneuver and call your health care provider. Supplies needed: A bed or table. A pillow. How to do the Epley maneuver     Sit on the edge of a bed or table with your back straight and your legs extended or hanging over the edge of the bed or table. Turn your head halfway toward the affected ear or side as told by your health care provider. Lie backward quickly with your head turned until you are lying flat on your back. Your head should dangle (head-hanging position). You may want to position a pillow under your shoulders. Hold this position for at least 30 seconds. If you feel dizzy or have symptoms of vertigo, continue to hold the position until the symptoms stop. Turn your head to the opposite direction until your unaffected ear is facing down. Your head should continue to dangle. Hold this position for at least 30 seconds. If you feel dizzy or have symptoms of  vertigo, continue to hold the position until the symptoms stop. Turn your whole body to the same side as your head so that you are positioned on your side. Your head will now be nearly facedown and no longer needs to dangle. Hold for at least 30 seconds. If you feel dizzy or have symptoms of vertigo, continue to hold the position until the symptoms stop. Sit back up. You can repeat the maneuver in 24 hours if your vertigo does not go away. Follow these instructions at home: For 24 hours after doing the Epley maneuver: Keep your head in an upright position. When lying down to sleep or rest, keep your head raised (elevated) with two or more pillows. Avoid excessive neck movements. Activity Do not drive or use machinery if you feel dizzy. After doing the Epley maneuver, return to your normal activities as told by your health care provider. Ask your health care provider what activities are safe for you. General instructions Drink enough fluid to keep your urine pale yellow. Do not drink alcohol. Take over-the-counter and prescription medicines only as told by your health care provider. Keep all follow-up visits. This is important. Preventing vertigo symptoms Ask your health care provider if there is anything you should do at home to prevent vertigo. He or she may recommend that you: Keep your head elevated with two or more pillows while you sleep. Do not sleep on the side of your affected ear. Get   up slowly from bed. Avoid sudden movements during the day. Avoid extreme head positions or movement, such as looking up or bending over. Contact a health care provider if: Your vertigo gets worse. You have other symptoms, including: Nausea. Vomiting. Headache. Get help right away if you: Have vision changes. Have a headache or neck pain that is severe or getting worse. Cannot stop vomiting. Have new numbness or weakness in any part of your body. These symptoms may represent a serious problem  that is an emergency. Do not wait to see if the symptoms will go away. Get medical help right away. Call your local emergency services (911 in the [place] Do not drive yourself to the hospital. Summary Vertigo is the feeling that you or your surroundings are moving when they are not. The Epley maneuver is an exercise that relieves symptoms of vertigo. If the Epley maneuver is done correctly, it is considered safe. This information is not intended to replace advice given to you by your health care provider. Make sure you discuss any questions you have with your health care provider. Document Revised: 01/19/2020 Document Reviewed: 01/19/2020 Elsevier Patient Education  2023 Elsevier Inc.  

## 2022-06-28 NOTE — Assessment & Plan Note (Signed)
Pt c/o fatigue Will recheck labs today
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
## 2022-06-28 NOTE — Assessment & Plan Note (Signed)
Lab Results  Component Value Date   TSH 4.03 05/14/2022   Check labs today

## 2022-06-28 NOTE — Assessment & Plan Note (Signed)
Macrobid sent  Culture pending

## 2022-06-30 LAB — URINE CULTURE
MICRO NUMBER:: 14879501
SPECIMEN QUALITY:: ADEQUATE

## 2022-07-11 ENCOUNTER — Encounter: Payer: Self-pay | Admitting: Hematology & Oncology

## 2022-07-11 ENCOUNTER — Inpatient Hospital Stay: Payer: Medicare Other | Admitting: Hematology & Oncology

## 2022-07-11 ENCOUNTER — Inpatient Hospital Stay: Payer: Medicare Other | Attending: Hematology & Oncology

## 2022-07-11 VITALS — BP 130/67 | HR 74 | Temp 97.7°F | Resp 20 | Ht 60.0 in | Wt 144.8 lb

## 2022-07-11 DIAGNOSIS — H538 Other visual disturbances: Secondary | ICD-10-CM | POA: Insufficient documentation

## 2022-07-11 DIAGNOSIS — D509 Iron deficiency anemia, unspecified: Secondary | ICD-10-CM | POA: Insufficient documentation

## 2022-07-11 DIAGNOSIS — C8338 Diffuse large B-cell lymphoma, lymph nodes of multiple sites: Secondary | ICD-10-CM | POA: Diagnosis not present

## 2022-07-11 DIAGNOSIS — Z881 Allergy status to other antibiotic agents status: Secondary | ICD-10-CM | POA: Diagnosis not present

## 2022-07-11 DIAGNOSIS — R531 Weakness: Secondary | ICD-10-CM | POA: Diagnosis not present

## 2022-07-11 DIAGNOSIS — D500 Iron deficiency anemia secondary to blood loss (chronic): Secondary | ICD-10-CM

## 2022-07-11 DIAGNOSIS — C8203 Follicular lymphoma grade I, intra-abdominal lymph nodes: Secondary | ICD-10-CM | POA: Diagnosis not present

## 2022-07-11 DIAGNOSIS — Z79899 Other long term (current) drug therapy: Secondary | ICD-10-CM | POA: Insufficient documentation

## 2022-07-11 DIAGNOSIS — M542 Cervicalgia: Secondary | ICD-10-CM | POA: Insufficient documentation

## 2022-07-11 DIAGNOSIS — Z888 Allergy status to other drugs, medicaments and biological substances status: Secondary | ICD-10-CM | POA: Diagnosis not present

## 2022-07-11 DIAGNOSIS — R32 Unspecified urinary incontinence: Secondary | ICD-10-CM | POA: Insufficient documentation

## 2022-07-11 DIAGNOSIS — M791 Myalgia, unspecified site: Secondary | ICD-10-CM | POA: Diagnosis not present

## 2022-07-11 DIAGNOSIS — C8580 Other specified types of non-Hodgkin lymphoma, unspecified site: Secondary | ICD-10-CM

## 2022-07-11 LAB — LACTATE DEHYDROGENASE: LDH: 95 U/L — ABNORMAL LOW (ref 98–192)

## 2022-07-11 LAB — CBC WITH DIFFERENTIAL (CANCER CENTER ONLY)
Abs Immature Granulocytes: 0.03 10*3/uL (ref 0.00–0.07)
Basophils Absolute: 0.1 10*3/uL (ref 0.0–0.1)
Basophils Relative: 1 %
Eosinophils Absolute: 0.3 10*3/uL (ref 0.0–0.5)
Eosinophils Relative: 4 %
HCT: 40.4 % (ref 36.0–46.0)
Hemoglobin: 13.2 g/dL (ref 12.0–15.0)
Immature Granulocytes: 0 %
Lymphocytes Relative: 21 %
Lymphs Abs: 1.5 10*3/uL (ref 0.7–4.0)
MCH: 27.7 pg (ref 26.0–34.0)
MCHC: 32.7 g/dL (ref 30.0–36.0)
MCV: 84.9 fL (ref 80.0–100.0)
Monocytes Absolute: 0.7 10*3/uL (ref 0.1–1.0)
Monocytes Relative: 9 %
Neutro Abs: 4.8 10*3/uL (ref 1.7–7.7)
Neutrophils Relative %: 65 %
Platelet Count: 235 10*3/uL (ref 150–400)
RBC: 4.76 MIL/uL (ref 3.87–5.11)
RDW: 14.3 % (ref 11.5–15.5)
WBC Count: 7.4 10*3/uL (ref 4.0–10.5)
nRBC: 0 % (ref 0.0–0.2)

## 2022-07-11 LAB — CMP (CANCER CENTER ONLY)
ALT: 11 U/L (ref 0–44)
AST: 14 U/L — ABNORMAL LOW (ref 15–41)
Albumin: 4.5 g/dL (ref 3.5–5.0)
Alkaline Phosphatase: 75 U/L (ref 38–126)
Anion gap: 9 (ref 5–15)
BUN: 19 mg/dL (ref 8–23)
CO2: 29 mmol/L (ref 22–32)
Calcium: 9.6 mg/dL (ref 8.9–10.3)
Chloride: 106 mmol/L (ref 98–111)
Creatinine: 1.06 mg/dL — ABNORMAL HIGH (ref 0.44–1.00)
GFR, Estimated: 52 mL/min — ABNORMAL LOW (ref >60)
Glucose, Bld: 118 mg/dL — ABNORMAL HIGH (ref 70–99)
Potassium: 4 mmol/L (ref 3.5–5.1)
Sodium: 144 mmol/L (ref 135–145)
Total Bilirubin: 0.4 mg/dL (ref 0.3–1.2)
Total Protein: 6.6 g/dL (ref 6.5–8.1)

## 2022-07-11 LAB — FERRITIN: Ferritin: 393 ng/mL — ABNORMAL HIGH (ref 11–307)

## 2022-07-11 NOTE — Progress Notes (Signed)
Hematology and Oncology Follow Up Visit  Amanda [name] [id number] 04-Jul-1940 82 y.o. 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  We see her every 6 months.  Since we last saw her, she been doing okay.  Unfortunately, her vision has not been doing all that well.  She does see ophthalmology.  She does have retinal issues.  Hopefully, she will not have continued deterioration of her eyes.  She also has some urinary incontinence.  I think she has an appointment to see Urology for this.  She has a lot of arthritic issues.  She has hard time moving her neck.  She otherwise, seems to be managing.  She is going to have a nice Mother's Day weekend.  Her daughter is coming up from [place].  She is eating well.  She is having no problems with nausea or vomiting.  She is having no cough or shortness of breath.  She has had no issues with COVID.  She has had no fever.  There is been no rashes.  She has had no bleeding.  Overall, I would have said that her performance status is probably ECOG 2.    Medications:  Allergies as of 07/11/2022       Reactions   Phenergan [promethazine Hcl] Other (See Comments)   Jerking/agitation   Preservision Areds 2 [multiple Vitamins-minerals] Nausea Only   Levaquin [levofloxacin] Nausea And Vomiting   Pravastatin Nausea And Vomiting        Medication List        Accurate as of Jul 11, 2022  4:08 PM. If you have any questions, ask your nurse or doctor.          STOP taking these medications    nitrofurantoin (macrocrystal-monohydrate) 100 MG capsule Commonly known as: Macrobid Stopped by: Josph Macho, MD       TAKE these medications    acetaminophen 325 MG  tablet Commonly known as: TYLENOL Take 1-2 tablets (325-650 mg total) by mouth every 8 (eight) hours as needed for mild pain.   albuterol 108 (90 Base) MCG/ACT inhaler Commonly known as: VENTOLIN HFA INHALE 2 PUFFS INTO THE LUNGS EVERY 6 HOURS AS NEEDED FOR WHEEZING OR SHORTNESS OF BREATH   amLODipine 10 MG tablet Commonly known as: NORVASC TAKE 1 TABLET(10 MG) BY MOUTH DAILY   aspirin 81 MG chewable tablet Chew 1 tablet (81 mg total) by mouth daily.   buPROPion 150 MG 24 hr tablet Commonly known as: WELLBUTRIN XL TAKE 1 TABLET(150 MG) BY MOUTH DAILY   guaiFENesin 600 MG 12 hr tablet Commonly known as: MUCINEX Take 600 mg by mouth daily.   ibuprofen 200 MG tablet Commonly known as: ADVIL Take 200 mg by mouth daily as needed.   levothyroxine 125 MCG tablet Commonly known as: SYNTHROID TAKE 1 TABLET(125 MCG) BY MOUTH DAILY BEFORE BREAKFAST   mirabegron ER 50 MG Tb24 tablet Commonly known as: Myrbetriq Take 1 tablet (50 mg total) by mouth daily.   pantoprazole 40 MG tablet Commonly known as: PROTONIX TAKE 1 TABLET(40 MG) BY MOUTH DAILY   rosuvastatin 5 MG tablet Commonly known as: Crestor Take 1 tablet by mouth on  Monday, Wednesday, and Friday   senna-docusate 8.6-50 MG tablet Commonly known as: Senokot-S Take 2 tablets by mouth at bedtime.   Vitamin D3 Adult Gummies 25 MCG (1000 UT) Chew Generic drug: Cholecalciferol Chew 2 each by mouth daily.        Allergies:  Allergies  Allergen Reactions   Phenergan [Promethazine Hcl] Other (See Comments)    Jerking/agitation   Preservision Areds 2 [Multiple Vitamins-Minerals] Nausea Only   Levaquin [Levofloxacin] Nausea And Vomiting   Pravastatin Nausea And Vomiting    Past Medical History, Surgical history, Social history, and Family History were reviewed and updated.  Review of Systems: Review of Systems  Constitutional: Negative.   HENT: Negative.    Eyes:  Positive for blurred vision.  Respiratory:  Negative.    Cardiovascular: Negative.   Gastrointestinal: Negative.   Genitourinary:  Positive for frequency.  Musculoskeletal:  Positive for myalgias and neck pain.  Skin: Negative.   Neurological:  Positive for focal weakness.  Endo/Heme/Allergies: Negative.   Psychiatric/Behavioral: Negative.       Physical Exam:  height is 5' (1.524 m) and weight is 144 lb 12.8 oz (65.7 kg). Her oral temperature is 97.7 F (36.5 C). Her blood pressure is 130/67 and her pulse is 74. Her respiration is 20 and oxygen saturation is 96%.   Wt Readings from Last 3 Encounters:  07/11/22 144 lb 12.8 oz (65.7 kg)  06/28/22 145 lb 9.6 oz (66 kg)  05/14/22 147 lb 6.4 oz (66.9 kg)    Physical Exam Vitals reviewed.  HENT:     Head: Normocephalic and atraumatic.  Eyes:     Pupils: Pupils are equal, round, and reactive to light.  Cardiovascular:     Rate and Rhythm: Normal rate and regular rhythm.     Heart sounds: Normal heart sounds.  Pulmonary:     Effort: Pulmonary effort is normal.     Breath sounds: Normal breath sounds.  Abdominal:     General: Bowel sounds are normal.     Palpations: Abdomen is soft.  Musculoskeletal:        General: No tenderness or deformity. Normal range of motion.     Cervical back: Normal range of motion.  Lymphadenopathy:     Cervical: No cervical adenopathy.  Skin:    General: Skin is warm and dry.     Findings: No erythema or rash.  Neurological:     Mental Status: She is alert and oriented to person, place, and time.  Psychiatric:        Behavior: Behavior normal.        Thought Content: Thought content normal.        Judgment: Judgment normal.      Lab Results  Component Value Date   WBC 7.4 07/11/2022   HGB 13.2 07/11/2022   HCT 40.4 07/11/2022   MCV 84.9 07/11/2022   PLT 235 07/11/2022   Lab Results  Component Value Date   FERRITIN 413 (H) 02/19/2022   IRON 75 02/19/2022   TIBC 322 02/19/2022   UIBC 247 02/19/2022   IRONPCTSAT 23  02/19/2022   Lab Results  Component Value Date   RBC 4.76 07/11/2022   No results found for: "KPAFRELGTCHN", "LAMBDASER", "KAPLAMBRATIO" Lab Results  Component Value Date   IGGSERUM 206 (L) 04/19/2021   IGA 19 (L) 04/19/2021   IGMSERUM <5 (L) 04/19/2021   No results found for: "TOTALPROTELP", "ALBUMINELP", "A1GS", "A2GS", "BETS", "BETA2SER", "GAMS", "MSPIKE", "SPEI"   Chemistry  Component Value Date/Time   NA 144 07/11/2022 1507   NA 146 (H) 05/26/2019 1108   K 4.0 07/11/2022 1507   CL 106 07/11/2022 1507   CO2 29 07/11/2022 1507   BUN 19 07/11/2022 1507   BUN 17 05/26/2019 1108   CREATININE 1.06 (H) 07/11/2022 1507   CREATININE 1.09 (H) 02/09/2021 1545   GLU 91 06/23/2018 0000      Component Value Date/Time   CALCIUM 9.6 07/11/2022 1507   CALCIUM 12.3 (H) 06/16/2018 1639   ALKPHOS 75 07/11/2022 1507   AST 14 (L) 07/11/2022 1507   ALT 11 07/11/2022 1507   BILITOT 0.4 07/11/2022 1507       Impression and Plan: Amanda [name] is a very charming 82 yo caucasian female with a transformed marginal zone lymphoma.  She had R-CHOP for 6 cycles completed in June 2020.  She completed maintenance Rituxan in November 2022.  She continues to do well and so far there has been no evidence of recurrence.   I do not see a need for any scans right now.  I think we can just follow her along.  Unless something shows up symptomatically, or with her labs, then we can do a scan on her.  I hope that her other issues will be taking care of and that she will have a good quality of life.  We will plan to get her back in 6 months. Josph Macho, MD 5/9/20244:08 PM

## 2022-07-12 LAB — IRON AND IRON BINDING CAPACITY (CC-WL,HP ONLY)
Iron: 69 ug/dL (ref 28–170)
Saturation Ratios: 23 % (ref 10.4–31.8)
TIBC: 305 ug/dL (ref 250–450)
UIBC: 236 ug/dL (ref 148–442)

## 2022-07-16 ENCOUNTER — Ambulatory Visit (INDEPENDENT_AMBULATORY_CARE_PROVIDER_SITE_OTHER): Payer: Medicare Other | Admitting: *Deleted

## 2022-07-16 VITALS — BP 149/82 | HR 60 | Ht 60.0 in | Wt 145.0 lb

## 2022-07-16 DIAGNOSIS — Z0000 Encounter for general adult medical examination without abnormal findings: Secondary | ICD-10-CM

## 2022-07-16 NOTE — Patient Instructions (Signed)
Ms. Brierly , Thank you for taking time to come for your Medicare Wellness Visit. I appreciate your ongoing commitment to your health goals. Please review the following plan we discussed and let me know if I can assist you in the future.      This is a list of the screening recommended for you and due dates:  Health Maintenance  Topic Date Due   Zoster (Shingles) Vaccine (1 of 2) Never done   COVID-19 Vaccine (4 - 2023-24 season) 11/02/2021   Flu Shot  10/03/2022   Medicare Annual Wellness Visit  07/16/2023   DEXA scan (bone density measurement)  06/09/2024   DTaP/Tdap/Td vaccine (3 - Td or Tdap) 12/11/2028   Pneumonia Vaccine  Completed   HPV Vaccine  Aged Out     Next appointment: Follow up in one year for your annual wellness visit.   Preventive Care 84 Years and Older, Female Preventive care refers to lifestyle choices and visits with your health care provider that can promote health and wellness. What does preventive care include? A yearly physical exam. This is also called an annual well check. Dental exams once or twice a year. Routine eye exams. Ask your health care provider how often you should have your eyes checked. Personal lifestyle choices, including: Daily care of your teeth and gums. Regular physical activity. Eating a healthy diet. Avoiding tobacco and drug use. Limiting alcohol use. Practicing safe sex. Taking low-dose aspirin every day. Taking vitamin and mineral supplements as recommended by your health care provider. What happens during an annual well check? The services and screenings done by your health care provider during your annual well check will depend on your age, overall health, lifestyle risk factors, and family history of disease. Counseling  Your health care provider may ask you questions about your: Alcohol use. Tobacco use. Drug use. Emotional well-being. Home and relationship well-being. Sexual activity. Eating habits. History of  falls. Memory and ability to understand (cognition). Work and work Astronomer. Reproductive health. Screening  You may have the following tests or measurements: Height, weight, and BMI. Blood pressure. Lipid and cholesterol levels. These may be checked every 5 years, or more frequently if you are over 84 years old. Skin check. Lung cancer screening. You may have this screening every year starting at age 14 if you have a 30-pack-year history of smoking and currently smoke or have quit within the past 15 years. Fecal occult blood test (FOBT) of the stool. You may have this test every year starting at age 26. Flexible sigmoidoscopy or colonoscopy. You may have a sigmoidoscopy every 5 years or a colonoscopy every 10 years starting at age 84. Hepatitis C blood test. Hepatitis B blood test. Sexually transmitted disease (STD) testing. Diabetes screening. This is done by checking your blood sugar (glucose) after you have not eaten for a while (fasting). You may have this done every 1-3 years. Bone density scan. This is done to screen for osteoporosis. You may have this done starting at age 84. Mammogram. This may be done every 1-2 years. Talk to your health care provider about how often you should have regular mammograms. Talk with your health care provider about your test results, treatment options, and if necessary, the need for more tests. Vaccines  Your health care provider may recommend certain vaccines, such as: Influenza vaccine. This is recommended every year. Tetanus, diphtheria, and acellular pertussis (Tdap, Td) vaccine. You may need a Td booster every 10 years. Zoster vaccine. You may need  this after age 27. Pneumococcal 13-valent conjugate (PCV13) vaccine. One dose is recommended after age 84. Pneumococcal polysaccharide (PPSV23) vaccine. One dose is recommended after age 4. Talk to your health care provider about which screenings and vaccines you need and how often you need  them. This information is not intended to replace advice given to you by your health care provider. Make sure you discuss any questions you have with your health care provider. Document Released: 03/17/2015 Document Revised: 11/08/2015 Document Reviewed: 12/20/2014 Elsevier Interactive Patient Education  2017 [place] Prevention in the Home Falls can cause injuries. They can happen to people of all ages. There are many things you can do to make your home safe and to help prevent falls. What can I do on the outside of my home? Regularly fix the edges of walkways and driveways and fix any cracks. Remove anything that might make you trip as you walk through a door, such as a raised step or threshold. Trim any bushes or trees on the path to your home. Use bright outdoor lighting. Clear any walking paths of anything that might make someone trip, such as rocks or tools. Regularly check to see if handrails are loose or broken. Make sure that both sides of any steps have handrails. Any raised decks and porches should have guardrails on the edges. Have any leaves, snow, or ice cleared regularly. Use sand or salt on walking paths during winter. Clean up any spills in your garage right away. This includes oil or grease spills. What can I do in the bathroom? Use night lights. Install grab bars by the toilet and in the tub and shower. Do not use towel bars as grab bars. Use non-skid mats or decals in the tub or shower. If you need to sit down in the shower, use a plastic, non-slip stool. Keep the floor dry. Clean up any water that spills on the floor as soon as it happens. Remove soap buildup in the tub or shower regularly. Attach bath mats securely with double-sided non-slip rug tape. Do not have throw rugs and other things on the floor that can make you trip. What can I do in the bedroom? Use night lights. Make sure that you have a light by your bed that is easy to reach. Do not use  any sheets or blankets that are too big for your bed. They should not hang down onto the floor. Have a firm chair that has side arms. You can use this for support while you get dressed. Do not have throw rugs and other things on the floor that can make you trip. What can I do in the kitchen? Clean up any spills right away. Avoid walking on wet floors. Keep items that you use a lot in easy-to-reach places. If you need to reach something above you, use a strong step stool that has a grab bar. Keep electrical cords out of the way. Do not use floor polish or wax that makes floors slippery. If you must use wax, use non-skid floor wax. Do not have throw rugs and other things on the floor that can make you trip. What can I do with my stairs? Do not leave any items on the stairs. Make sure that there are handrails on both sides of the stairs and use them. Fix handrails that are broken or loose. Make sure that handrails are as long as the stairways. Check any carpeting to make sure that it is firmly attached to  the stairs. Fix any carpet that is loose or worn. Avoid having throw rugs at the top or bottom of the stairs. If you do have throw rugs, attach them to the floor with carpet tape. Make sure that you have a light switch at the top of the stairs and the bottom of the stairs. If you do not have them, ask someone to add them for you. What else can I do to help prevent falls? Wear shoes that: Do not have high heels. Have rubber bottoms. Are comfortable and fit you well. Are closed at the toe. Do not wear sandals. If you use a stepladder: Make sure that it is fully opened. Do not climb a closed stepladder. Make sure that both sides of the stepladder are locked into place. Ask someone to hold it for you, if possible. Clearly mark and make sure that you can see: Any grab bars or handrails. First and last steps. Where the edge of each step is. Use tools that help you move around (mobility aids)  if they are needed. These include: Canes. Walkers. Scooters. Crutches. Turn on the lights when you go into a dark area. Replace any light bulbs as soon as they burn out. Set up your furniture so you have a clear path. Avoid moving your furniture around. If any of your floors are uneven, fix them. If there are any pets around you, be aware of where they are. Review your medicines with your doctor. Some medicines can make you feel dizzy. This can increase your chance of falling. Ask your doctor what other things that you can do to help prevent falls. This information is not intended to replace advice given to you by your health care provider. Make sure you discuss any questions you have with your health care provider. Document Released: 12/15/2008 Document Revised: 07/27/2015 Document Reviewed: 03/25/2014 Elsevier Interactive Patient Education  2017 [employer].

## 2022-07-16 NOTE — Progress Notes (Signed)
Subjective:   Amanda [name] is a 82 y.o. female who presents for Medicare Annual (Subsequent) preventive examination.  Review of Systems     Cardiac Risk Factors include: advanced age (>6men, >103 women);dyslipidemia;hypertension     Objective:    Today's Vitals   07/16/22 1541 07/16/22 1610  BP: (!) 154/75 (!) 149/82  Pulse: 63 60  Weight: 145 lb (65.8 kg)   Height: 5' (1.524 m)    Body mass index is 28.32 kg/m.     07/16/2022    4:22 PM 07/11/2022    3:44 PM 02/19/2022    2:27 PM 10/18/2021    1:06 PM 06/20/2021    1:47 PM 06/12/2021   12:37 PM 04/19/2021    2:01 PM  Advanced Directives  Does Patient Have a Medical Advance Directive? Yes Yes Yes Yes Yes Yes Yes  Type of Estate agent of [place];Living will Healthcare Power of [place];Living will Living will;Healthcare Power of [employer] Power of [place];Living will Healthcare Power of [place];Living will Healthcare Power of [place];Living will Healthcare Power of [place];Living will  Does patient want to make changes to medical advance directive? No - Patient declined  No - Patient declined No - Patient declined   No - Patient declined  Copy of Healthcare Power of Attorney in Chart? Yes - validated most recent copy scanned in chart (See row information)   Yes - validated most recent copy scanned in chart (See row information) Yes - validated most recent copy scanned in chart (See row information) Yes - validated most recent copy scanned in chart (See row information) No - copy requested    Current Medications (verified) Outpatient Encounter Medications as of 07/16/2022  Medication Sig   oxybutynin (DITROPAN-XL) 10 MG 24 hr tablet Take 10 mg by mouth at bedtime.   acetaminophen (TYLENOL) 325 MG tablet Take 1-2 tablets (325-650 mg total) by mouth every 8 (eight) hours as needed for mild pain.   albuterol (VENTOLIN HFA) 108 (90 Base) MCG/ACT inhaler INHALE 2 PUFFS INTO THE LUNGS EVERY 6 HOURS AS  NEEDED FOR WHEEZING OR SHORTNESS OF BREATH (Patient not taking: Reported on 07/11/2022)   amLODipine (NORVASC) 10 MG tablet TAKE 1 TABLET(10 MG) BY MOUTH DAILY   aspirin 81 MG chewable tablet Chew 1 tablet (81 mg total) by mouth daily.   buPROPion (WELLBUTRIN XL) 150 MG 24 hr tablet TAKE 1 TABLET(150 MG) BY MOUTH DAILY   Cholecalciferol (VITAMIN D3 ADULT GUMMIES) 25 MCG (1000 UT) CHEW Chew 2 each by mouth daily.   guaiFENesin (MUCINEX) 600 MG 12 hr tablet Take 600 mg by mouth daily.   ibuprofen (ADVIL) 200 MG tablet Take 200 mg by mouth daily as needed.   levothyroxine (SYNTHROID) 125 MCG tablet TAKE 1 TABLET(125 MCG) BY MOUTH DAILY BEFORE BREAKFAST   pantoprazole (PROTONIX) 40 MG tablet TAKE 1 TABLET(40 MG) BY MOUTH DAILY   rosuvastatin (CRESTOR) 5 MG tablet Take 1 tablet by mouth on Monday, Wednesday, and Friday   senna-docusate (SENOKOT-S) 8.6-50 MG tablet Take 2 tablets by mouth at bedtime.   [DISCONTINUED] mirabegron ER (MYRBETRIQ) 50 MG TB24 tablet Take 1 tablet (50 mg total) by mouth daily.   No facility-administered encounter medications on file as of 07/16/2022.    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   Lumbar burst fracture (HCC)    Lymphoproliferative disorder (HCC)    Macular degeneration 2013   Both eyes    Macular degeneration, bilateral    Marginal zone lymphoma (HCC)    Osteopenia    Pneumonia    Pneumonia due to  COVID-19 virus 2021   Required hospitalization   PONV (postoperative nausea and vomiting)    needs little anesthesia   Shingles    Shortness of breath    on exertion   Spleen enlarged    SUI (stress urinary incontinence, female)    Urinary, incontinence, stress female    Wears glasses    Past Surgical History:  Procedure Laterality Date   BREAST EXCISIONAL BIOPSY Left 1980   CARPAL TUNNEL RELEASE  1999   CATARACT EXTRACTION  2009, 2011   BOTH EYES   CATARACT EXTRACTION, BILATERAL     CESAREAN SECTION  1959   CESAREAN SECTION     COLONOSCOPY      Dr Matthias Hughs   DILATION AND CURETTAGE OF UTERUS     X2   EYE SURGERY  Cataracts removed   FRACTURE SURGERY  12/12/2018   Left ankle plates   HYSTEROSCOPY WITH D & C  01/07/2012   Procedure: DILATATION AND CURETTAGE /HYSTEROSCOPY;  Surgeon: Ok Edwards, MD;  Location: [facility];  Service: Gynecology;  Laterality: N/A;  intrauterine foley catheter for tamponode    IR IMAGING GUIDED PORT INSERTION  07/15/2018   IR REMOVAL TUN ACCESS W/ PORT W/O FL MOD SED  04/11/2021   LYMPH NODE BIOPSY Left 05/26/2018   Procedure: LEFT AXILLARY LYMPH NODE BIOPSY;  Surgeon: Claud Kelp, MD;  Location: [facility];  Service: General;  Laterality: Left;   ORIF ANKLE FRACTURE Left 12/12/2018   ORIF ANKLE FRACTURE Left 12/12/2018   Procedure: OPEN REDUCTION INTERNAL FIXATION (ORIF) ANKLE FRACTURE;  Surgeon: Cammy Copa, MD;  Location: [facility] OR;  Service: Orthopedics;  Laterality: Left;   ORIF ANKLE FRACTURE Left 12/2018   TONSILLECTOMY     TONSILLECTOMY AND ADENOIDECTOMY     TUBAL LIGATION     BY LAPAROSCOPY   WISDOM TOOTH EXTRACTION     Family History  Problem Relation Age of Onset   Ovarian cancer Mother    Breast cancer Mother 31   Hypertension Father    Prostate cancer Father    Kidney failure Father    Diabetes Father    Kidney disease Father    Hyperlipidemia Brother    COPD Paternal Grandfather    Stroke Maternal Grandfather     Hypercalcemia Neg Hx    Social History   Socioeconomic History   Marital status: Married    Spouse name: Not on file   Number of children: Not on file   Years of education: Not on file   Highest education level: Not on file  Occupational History   Not on file  Tobacco Use   Smoking status: Never   Smokeless tobacco: Never  Vaping Use   Vaping Use: Never used  Substance and Sexual Activity   Alcohol use: Yes  Alcohol/week: 1.0 standard drink of alcohol    Types: 1 Glasses of wine per week    Comment: RARE   Drug use: Never   Sexual activity: Not Currently    Birth control/protection: Post-menopausal  Other Topics Concern   Not on file  Social History Narrative   ** Merged History Encounter **       ** Merged History Encounter **       Social Determinants of Health   Financial Resource Strain: Low Risk  (07/16/2022)   Overall Financial Resource Strain (CARDIA)    Difficulty of Paying Living Expenses: Not hard at all  Food Insecurity: No Food Insecurity (07/16/2022)   Hunger Vital Sign    Worried About Running Out of Food in the Last Year: Never true    Ran Out of Food in the Last Year: Never true  Transportation Needs: No Transportation Needs (07/16/2022)   PRAPARE - Administrator, Civil Service (Medical): No    Lack of Transportation (Non-Medical): No  Physical Activity: Insufficiently Active (07/16/2022)   Exercise Vital Sign    Days of Exercise per Week: 5 days    Minutes of Exercise per Session: 20 min  Stress: No Stress Concern Present (07/16/2022)   [employer] of Occupational Health - Occupational Stress Questionnaire    Feeling of Stress : Not at all  Social Connections: Unknown (07/16/2022)   Social Connection and Isolation Panel [NHANES]    Frequency of Communication with Friends and Family: More than three times a week    Frequency of Social Gatherings with Friends and Family: Twice a week    Attends Religious Services: Not on Environmental health practitioner or Organizations: Yes    Attends Engineer, structural: More than 4 times per year    Marital Status: Married    Tobacco Counseling Counseling given: Not Answered   Clinical Intake:  Pre-visit preparation completed: Yes  Pain : No/denies pain  BMI - recorded: 28.32 Nutritional Status: BMI 25 -29 Overweight Nutritional Risks: None Diabetes: No  How often do you need to have someone help you when you read instructions, pamphlets, or other written materials from your doctor or pharmacy?: 1 - Never   Activities of Daily Living    07/16/2022   12:00 PM  In your present state of health, do you have any difficulty performing the following activities:  Hearing? 0  Vision? 1  Difficulty concentrating or making decisions? 0  Walking or climbing stairs? 1  Dressing or bathing? 0  Doing errands, shopping? 0  Preparing Food and eating ? N  Using the Toilet? N  In the past six months, have you accidently leaked urine? Y  Do you have problems with loss of bowel control? N  Managing your Medications? N  Managing your Finances? N  Housekeeping or managing your Housekeeping? N    Patient Care Team: Zola Button, Grayling Congress, DO as PCP - General (Family Medicine) Elmon Else, MD as Consulting Physician (Dermatology) Mateo Flow, MD as Consulting Physician (Ophthalmology) Ok Edwards, MD (Inactive) as Consulting Physician (Gynecology) Josph Macho, MD as Medical Oncologist (Oncology) Zola Button, Grayling Congress, DO (Family Medicine) Ranelle Oyster, MD as Consulting Physician (Physical Medicine and Rehabilitation) Maurice Small Clovis Pu, MD as Consulting Physician (Neurosurgery) Henrene Pastor, RPH-CPP (Pharmacist)  Indicate any recent Medical Services you may have received from other than [facility] providers in the past year (date may be approximate).  Assessment:   This is a routine wellness examination for Amanda [name].  Hearing/Vision screen No  results found.  Dietary issues and exercise activities discussed: Current Exercise Habits: The patient does not participate in regular exercise at present, Exercise limited by: None identified   Goals Addressed   None    Depression Screen    07/16/2022    3:52 PM 05/14/2022    2:10 PM 06/12/2021   12:43 PM 04/17/2021    2:33 PM 02/09/2021    3:19 PM 06/21/2020    2:22 PM 04/28/2020   11:32 AM  PHQ 2/9 Scores  PHQ - 2 Score 0 0 0 0 0 0 1  PHQ- 9 Score       9    Fall Risk    07/16/2022   12:00 PM 05/14/2022    2:09 PM 06/12/2021   12:40 PM 04/17/2021    2:33 PM 02/09/2021    3:19 PM  Fall Risk   Falls in the past year? 1 1 1  0 0  Number falls in past yr: 0 0 1 0 0  Injury with Fall? 1 1 0 0 0  Risk for fall due to : History of fall(s) History of fall(s);Impaired mobility History of fall(s) No Fall Risks No Fall Risks  Follow up Falls evaluation completed Falls evaluation completed Falls prevention discussed Falls evaluation completed Falls evaluation completed    FALL RISK PREVENTION PERTAINING TO THE HOME:  Any stairs in or around the home? Yes  If so, are there any without handrails? No  Home free of loose throw rugs in walkways, pet beds, electrical cords, etc? Yes  Adequate lighting in your home to reduce risk of falls? Yes   ASSISTIVE DEVICES UTILIZED TO PREVENT FALLS:  Life alert? No  Use of a cane, walker or w/c? No  Grab bars in the bathroom? Yes  Shower chair or bench in shower? No  Elevated toilet seat or a handicapped toilet? Yes   TIMED UP AND GO:  Was the test performed? Yes .  Length of time to ambulate 10 feet: 7 sec.   Gait steady and fast without use of assistive device  Cognitive Function:    08/29/2016   10:28 AM  MMSE - Mini Mental State Exam  Orientation to time 5  Orientation to Place 5  Registration 3  Attention/ Calculation 5  Recall 3  Language- name 2 objects 2  Language- repeat 1  Language- follow 3 step command 3  Language- read  & follow direction 1  Write a sentence 1  Copy design 1  Total score 30        07/16/2022    4:08 PM  6CIT Screen  What Year? 0 points  What month? 0 points  What time? 0 points  Count back from 20 0 points  Months in reverse 0 points  Repeat phrase 0 points  Total Score 0 points    Immunizations Immunization History  Administered Date(s) Administered   Fluad Quad(high Dose 65+) 12/16/2018, 12/07/2019, 01/04/2021   Influenza Whole 03/04/2001, 01/12/2009, 04/04/2010   Influenza, High Dose Seasonal PF 01/20/2014, 12/05/2014, 11/21/2015, 11/05/2016, 02/03/2018   Influenza,inj,Quad PF,6+ Mos 01/21/2013   Influenza-Unspecified 10/30/2017, 01/11/2022   PFIZER(Purple Top)SARS-COV-2 Vaccination 03/25/2019, 04/15/2019, 03/10/2020   Pneumococcal Conjugate-13 01/20/2014   Pneumococcal Polysaccharide-23 07/10/2012   Td 09/01/1997   Tdap 12/12/2018    TDAP status: Up to date  Flu Vaccine status: Up to date  Pneumococcal vaccine status: Up to date  Covid-19  vaccine status: Information provided on how to obtain vaccines.   Qualifies for Shingles Vaccine? Yes   Zostavax completed No   Shingrix Completed?: No.    Education has been provided regarding the importance of this vaccine. Patient has been advised to call insurance company to determine out of pocket expense if they have not yet received this vaccine. Advised may also receive vaccine at local pharmacy or Health Dept. Verbalized acceptance and understanding.  Screening Tests Health Maintenance  Topic Date Due   Zoster Vaccines- Shingrix (1 of 2) Never done   COVID-19 Vaccine (4 - 2023-24 season) 11/02/2021   INFLUENZA VACCINE  10/03/2022   Medicare Annual Wellness (AWV)  07/16/2023   DEXA SCAN  06/09/2024   DTaP/Tdap/Td (3 - Td or Tdap) 12/11/2028   Pneumonia Vaccine 6+ Years old  Completed   HPV VACCINES  Aged Out    Health Maintenance  Health Maintenance Due  Topic Date Due   Zoster Vaccines- Shingrix (1 of 2)  Never done   COVID-19 Vaccine (4 - 2023-24 season) 11/02/2021    Colorectal cancer screening: No longer required.   Mammogram status: No longer required due to Pt declined.  Bone Density status: Completed 06/10/22. Results reflect: Bone density results: OSTEOPENIA. Repeat every 2 years.  Lung Cancer Screening: (Low Dose CT Chest recommended if Age 32-80 years, 30 pack-year currently smoking OR have quit w/in 15years.) does not qualify.   Additional Screening:  Hepatitis C Screening: does not qualify  Vision Screening: Recommended annual ophthalmology exams for early detection of glaucoma and other disorders of the eye. Is the patient up to date with their annual eye exam?  Yes  Who is the provider or what is the name of the office in which the patient attends annual eye exams? [facility] If pt is not established with a provider, would they like to be referred to a provider to establish care? No .   Dental Screening: Recommended annual dental exams for proper oral hygiene  Community Resource Referral / Chronic Care Management: CRR required this visit?  No   CCM required this visit?  No      Plan:     I have personally reviewed and noted the following in the patient's chart:   Medical and social history Use of alcohol, tobacco or illicit drugs  Current medications and supplements including opioid prescriptions. Patient is not currently taking opioid prescriptions. Functional ability and status Nutritional status Physical activity Advanced directives List of other physicians Hospitalizations, surgeries, and ER visits in previous 12 months Vitals Screenings to include cognitive, depression, and falls Referrals and appointments  In addition, I have reviewed and discussed with patient certain preventive protocols, quality metrics, and best practice recommendations. A written personalized care plan for preventive services as well as general preventive health  recommendations were provided to patient.     Donne Anon, [place]   07/16/2022   Nurse Notes: None

## 2022-07-17 ENCOUNTER — Other Ambulatory Visit: Payer: Self-pay | Admitting: *Deleted

## 2022-07-17 MED ORDER — OXYBUTYNIN CHLORIDE ER 10 MG PO TB24: 10.0000 mg | ORAL_TABLET | Freq: Every day | ORAL | 1 refills | Status: DC

## 2022-07-18 ENCOUNTER — Other Ambulatory Visit: Payer: Self-pay | Admitting: Family Medicine

## 2022-07-18 DIAGNOSIS — M9901 Segmental and somatic dysfunction of cervical region: Secondary | ICD-10-CM | POA: Diagnosis not present

## 2022-07-18 DIAGNOSIS — M50323 Other cervical disc degeneration at C6-C7 level: Secondary | ICD-10-CM | POA: Diagnosis not present

## 2022-07-18 DIAGNOSIS — M9902 Segmental and somatic dysfunction of thoracic region: Secondary | ICD-10-CM | POA: Diagnosis not present

## 2022-07-23 DIAGNOSIS — M9901 Segmental and somatic dysfunction of cervical region: Secondary | ICD-10-CM | POA: Diagnosis not present

## 2022-07-23 DIAGNOSIS — M9902 Segmental and somatic dysfunction of thoracic region: Secondary | ICD-10-CM | POA: Diagnosis not present

## 2022-07-23 DIAGNOSIS — M50323 Other cervical disc degeneration at C6-C7 level: Secondary | ICD-10-CM | POA: Diagnosis not present

## 2022-07-25 DIAGNOSIS — M9901 Segmental and somatic dysfunction of cervical region: Secondary | ICD-10-CM | POA: Diagnosis not present

## 2022-07-25 DIAGNOSIS — M9902 Segmental and somatic dysfunction of thoracic region: Secondary | ICD-10-CM | POA: Diagnosis not present

## 2022-07-25 DIAGNOSIS — M50323 Other cervical disc degeneration at C6-C7 level: Secondary | ICD-10-CM | POA: Diagnosis not present

## 2022-07-30 DIAGNOSIS — M9901 Segmental and somatic dysfunction of cervical region: Secondary | ICD-10-CM | POA: Diagnosis not present

## 2022-07-30 DIAGNOSIS — M9902 Segmental and somatic dysfunction of thoracic region: Secondary | ICD-10-CM | POA: Diagnosis not present

## 2022-07-30 DIAGNOSIS — M50323 Other cervical disc degeneration at C6-C7 level: Secondary | ICD-10-CM | POA: Diagnosis not present

## 2022-08-01 DIAGNOSIS — M9902 Segmental and somatic dysfunction of thoracic region: Secondary | ICD-10-CM | POA: Diagnosis not present

## 2022-08-01 DIAGNOSIS — M50323 Other cervical disc degeneration at C6-C7 level: Secondary | ICD-10-CM | POA: Diagnosis not present

## 2022-08-01 DIAGNOSIS — M9901 Segmental and somatic dysfunction of cervical region: Secondary | ICD-10-CM | POA: Diagnosis not present

## 2022-08-06 DIAGNOSIS — M9902 Segmental and somatic dysfunction of thoracic region: Secondary | ICD-10-CM | POA: Diagnosis not present

## 2022-08-06 DIAGNOSIS — M9901 Segmental and somatic dysfunction of cervical region: Secondary | ICD-10-CM | POA: Diagnosis not present

## 2022-08-06 DIAGNOSIS — M50323 Other cervical disc degeneration at C6-C7 level: Secondary | ICD-10-CM | POA: Diagnosis not present

## 2022-08-08 DIAGNOSIS — M9902 Segmental and somatic dysfunction of thoracic region: Secondary | ICD-10-CM | POA: Diagnosis not present

## 2022-08-08 DIAGNOSIS — M50323 Other cervical disc degeneration at C6-C7 level: Secondary | ICD-10-CM | POA: Diagnosis not present

## 2022-08-08 DIAGNOSIS — M9901 Segmental and somatic dysfunction of cervical region: Secondary | ICD-10-CM | POA: Diagnosis not present

## 2022-08-13 ENCOUNTER — Other Ambulatory Visit (HOSPITAL_COMMUNITY)
Admission: RE | Admit: 2022-08-13 | Discharge: 2022-08-13 | Disposition: A | Discharge status: Home / Self Care | Payer: Medicare Other | Source: Other Acute Inpatient Hospital | Attending: Obstetrics and Gynecology | Admitting: Obstetrics and Gynecology

## 2022-08-13 ENCOUNTER — Ambulatory Visit: Payer: Medicare Other | Admitting: Obstetrics and Gynecology

## 2022-08-13 ENCOUNTER — Encounter: Payer: Self-pay | Admitting: Obstetrics and Gynecology

## 2022-08-13 VITALS — BP 125/72 | HR 69

## 2022-08-13 DIAGNOSIS — M62838 Other muscle spasm: Secondary | ICD-10-CM

## 2022-08-13 DIAGNOSIS — R350 Frequency of micturition: Secondary | ICD-10-CM | POA: Diagnosis not present

## 2022-08-13 DIAGNOSIS — R82998 Other abnormal findings in urine: Secondary | ICD-10-CM | POA: Insufficient documentation

## 2022-08-13 DIAGNOSIS — N3281 Overactive bladder: Secondary | ICD-10-CM

## 2022-08-13 DIAGNOSIS — R3915 Urgency of urination: Secondary | ICD-10-CM

## 2022-08-13 DIAGNOSIS — N393 Stress incontinence (female) (male): Secondary | ICD-10-CM

## 2022-08-13 LAB — POCT URINALYSIS DIPSTICK
Bilirubin, UA: NEGATIVE
Glucose, UA: NEGATIVE
Ketones, UA: NEGATIVE
Nitrite, UA: POSITIVE
Protein, UA: NEGATIVE
Spec Grav, UA: >=1.03 — AB (ref 1.010–1.025)
Urobilinogen, UA: 0.2 E.U./dL
pH, UA: 5.5 (ref 5.0–8.0)

## 2022-08-13 MED ORDER — TROSPIUM CHLORIDE 20 MG PO TABS
20.0000 mg | ORAL_TABLET | Freq: Two times a day (BID) | ORAL | 5 refills | Status: DC
Start: 2022-08-13 — End: 2022-09-24

## 2022-08-13 NOTE — Progress Notes (Signed)
[facility] New Patient Evaluation and Consultation  Referring Provider: Zola Button, Grayling Congress, * PCP: Zola Button, Grayling Congress, DO Date of Service: 08/13/2022  SUBJECTIVE Chief Complaint: New Patient (Initial Visit) Amanda [name] is a 82 y.o. female is here for incontinence.)  History of Present Illness: Amanda [name] is a 82 y.o. White or Caucasian female seen in consultation at the request of Dr. Zola Button for evaluation of mixed incontinence.    UUI>SUI  Review of records significant for: UTI 06/28/22: + for E.coli >100,000 Has been Oxybutynin 10mg  XL Has also tried Myrbetriq 50  Urinary Symptoms: Leaks urine with cough/ sneeze, going from sitting to standing, with a full bladder, with urgency, and without sensation Leaks 2-3 time(s) per days.  Pad use: 1 pads per day.   She is bothered by her UI symptoms.  Day time voids 4.  Nocturia: 1 times per night to void. Voiding dysfunction: she empties her bladder well.  does not use a catheter to empty bladder.  When urinating, she feels the need to urinate multiple times in a row Drinks: 8oz Coffee, 20oz Water, 8 oz Tea, Rare soda (Cherry coke) per day  UTIs: 2 UTI's in the last year.   Denies history of blood in urine, kidney or bladder stones, pyelonephritis, bladder cancer, and kidney cancer  Pelvic Organ Prolapse Symptoms:                  She Denies a feeling of a bulge the vaginal area.   Bowel Symptom: Bowel movements: 3 time(s) per week Stool consistency: hard Straining: no.  Splinting: no.  Incomplete evacuation: yes.  She Denies accidental bowel leakage / fecal incontinence Bowel regimen: stool softener Last colonoscopy: Date 2014, Results WNL  Sexual Function Sexually active: no.  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   Lumbar burst fracture (HCC)    Lymphoproliferative disorder (HCC)    Macular degeneration 2013   Both eyes    Macular degeneration, bilateral    Marginal zone lymphoma (HCC)    Osteopenia    Pneumonia    Pneumonia due to COVID-19 virus 2021   Required hospitalization   PONV (postoperative nausea and vomiting)    needs little anesthesia   Shingles    Shortness of breath    on exertion   Spleen enlarged    SUI (stress urinary incontinence, female)    Urinary, incontinence, stress female    Wears glasses      Past Surgical History:   Past Surgical History:  Procedure Laterality Date   BREAST EXCISIONAL BIOPSY Left 1980   CARPAL TUNNEL RELEASE  1999   CATARACT EXTRACTION  2009, 2011   BOTH EYES   CATARACT EXTRACTION, BILATERAL     CESAREAN SECTION  1959   CESAREAN SECTION     COLONOSCOPY  Dr Buccini   DILATION AND CURETTAGE OF UTERUS     X2   EYE SURGERY  Cataracts removed   FRACTURE SURGERY  12/12/2018   Left ankle plates   HYSTEROSCOPY WITH D & C  01/07/2012   Procedure: DILATATION AND CURETTAGE /HYSTEROSCOPY;  Surgeon: Ok Edwards, MD;  Location: [facility];  Service: Gynecology;  Laterality: N/A;  intrauterine foley catheter for tamponode    IR IMAGING GUIDED PORT INSERTION  07/15/2018   IR REMOVAL TUN ACCESS W/ PORT W/O FL MOD SED  04/11/2021   LYMPH NODE BIOPSY Left 05/26/2018   Procedure: LEFT AXILLARY LYMPH NODE BIOPSY;  Surgeon: Claud Kelp, MD;  Location: [facility] OR;  Service: General;  Laterality: Left;   ORIF ANKLE FRACTURE Left 12/12/2018   ORIF ANKLE FRACTURE Left 12/12/2018   Procedure: OPEN REDUCTION INTERNAL  FIXATION (ORIF) ANKLE FRACTURE;  Surgeon: Cammy Copa, MD;  Location: [facility] OR;  Service: Orthopedics;  Laterality: Left;   ORIF ANKLE FRACTURE Left 12/2018   TONSILLECTOMY     TONSILLECTOMY AND ADENOIDECTOMY     TUBAL LIGATION     BY LAPAROSCOPY   WISDOM TOOTH EXTRACTION       Past OB/GYN History: G3 P2 Vaginal deliveries: 0,  Forceps/ Vacuum deliveries: 0, Cesarean section: 2 Menopausal: Yes, at age 78 Last pap smear was 20 years ago.  Any history of abnormal pap smears: no.   Medications: She has a current medication list which includes the following prescription(s): acetaminophen, albuterol, amlodipine, aspirin, bupropion, vitamin d3 adult gummies, guaifenesin, ibuprofen, levothyroxine, pantoprazole, rosuvastatin, senna-docusate, and trospium.   Allergies: Patient is allergic to phenergan [promethazine hcl], preservision areds 2 [multiple vitamins-minerals], gabapentin, levaquin [levofloxacin], and pravastatin.   Social History:  Social History   Tobacco Use   Smoking status: Never   Smokeless tobacco: Never  Vaping Use   Vaping Use: Never used  Substance Use Topics   Alcohol use: Yes    Alcohol/week: 1.0 standard drink of alcohol    Types: 1 Glasses of wine per week    Comment: RARE   Drug use: Never    Relationship status: married She lives with husband.   She is not employed. Regular exercise: No History of abuse: No  Family History:   Family History  Problem Relation Age of Onset   Ovarian cancer Mother    Breast cancer Mother 71   Hypertension Father    Prostate cancer Father    Kidney failure Father    Diabetes Father    Kidney disease Father    Hyperlipidemia Brother    COPD Paternal Grandfather    Stroke Maternal Grandfather    Hypercalcemia Neg Hx      Review of Systems: Review of Systems  Constitutional:  Negative for chills, fever, malaise/fatigue and weight loss.  Respiratory:  Positive for shortness of breath. Negative for cough and  wheezing.   Cardiovascular:  Negative for chest pain, palpitations and leg swelling.  Gastrointestinal:  Negative for abdominal pain, blood in stool, constipation, nausea and vomiting.  Genitourinary:  Positive for frequency and urgency.  Musculoskeletal:  Negative for myalgias.  Skin:  Negative for rash.  Neurological:  Negative for dizziness, weakness and headaches.  Endo/Heme/Allergies:  Bruises/bleeds easily.  Psychiatric/Behavioral:  Negative for suicidal ideas. The patient is not nervous/anxious.      OBJECTIVE Physical Exam: Vitals:   08/13/22 1350  BP: 125/72  Pulse: 69    Physical Exam Constitutional:      Appearance: Normal appearance. She  is normal weight.  Pulmonary:     Effort: Pulmonary effort is normal.  Abdominal:     General: Abdomen is flat.  Genitourinary:    General: Normal vulva.  Neurological:     Mental Status: She is alert and oriented to person, place, and time.  Psychiatric:        Mood and Affect: Mood normal.        Behavior: Behavior normal.        Thought Content: Thought content normal.        Judgment: Judgment normal.      GU / Detailed Urogynecologic Evaluation:  Pelvic Exam: Normal external female genitalia; Bartholin's and Skene's glands normal in appearance; urethral meatus normal in appearance, no urethral masses or discharge.   CST: positive  Speculum exam reveals normal vaginal mucosa with atrophy. Cervix normal appearance. Uterus normal single, nontender. Adnexa normal adnexa.    With apex supported, anterior compartment defect was reduced  Pelvic floor strength II/V  Pelvic floor musculature: Right levator non-tender, Right obturator tender, Left levator tender, Left obturator tender  POP-Q:   POP-Q  -2                                            Aa   -2                                           Ba  -7                                              C   2                                            Gh  3.5                                             Pb  8                                            tvl   -2.5                                            Ap  -2.5                                            Bp  -7.5  D      Rectal Exam:  Normal external   Post-Void Residual (PVR) by Bladder Scan: In order to evaluate bladder emptying, we discussed obtaining a postvoid residual and she agreed to this procedure.  Procedure: The ultrasound unit was placed on the patient's abdomen in the suprapubic region after the patient had voided. A PVR of 0 ml was obtained by bladder scan.  Laboratory Results: POC Urine: Slightly cloudy, trace intact blood, and small leukocytes  ASSESSMENT AND PLAN Ms. Sloniker is a 82 y.o. with:  1. OAB (overactive bladder)   2. Urinary frequency   3. Leukocytes in urine   4. Levator spasm   5. Urinary urgency   6. SUI (stress urinary incontinence, female)    Patient has been on Oxybutynin 10mg . With her age and the increased risk for cognitive impairment would suggest switching to a medication less likely to cause cognitive impairments. Suggest Trospium 20mg  x2 daily due to cost and the fact that she has failed Myrbetriq 25mg .  POC urine positive for trace blood and leukocytes. Will send for culture to r/o UTI. Sending for culture.  Patient has significantly tender and tight pelvic floor muscles. Suggested pelvic floor PT. Patient declines at this time. Information given to patient in case she decides she would like to pursue pelvic floor PT.  Will see how Trospium does for patinet. Briefly discussed bladder botox.  CST+ during today's visit. Would not recommend surgical intervention at this time, but she may be a good candidate for a pessary or urethral bulking. Showed some interest in urethral bulking. Patient was given information about urethral bulking.   Patient to return in 6 weeks for medication f/u  Selmer Dominion,  NP

## 2022-08-13 NOTE — Patient Instructions (Signed)
Stop Oxybutynin and start Trospium 20mg  x2 daily.   Consider pelvic floor physical therapy.   Consider urethral bulking.

## 2022-08-14 DIAGNOSIS — H15833 Staphyloma posticum, bilateral: Secondary | ICD-10-CM | POA: Diagnosis not present

## 2022-08-14 DIAGNOSIS — H40013 Open angle with borderline findings, low risk, bilateral: Secondary | ICD-10-CM | POA: Diagnosis not present

## 2022-08-15 ENCOUNTER — Encounter: Payer: Self-pay | Admitting: Family Medicine

## 2022-08-15 ENCOUNTER — Other Ambulatory Visit: Payer: Self-pay | Admitting: Obstetrics and Gynecology

## 2022-08-15 DIAGNOSIS — M50323 Other cervical disc degeneration at C6-C7 level: Secondary | ICD-10-CM | POA: Diagnosis not present

## 2022-08-15 DIAGNOSIS — N390 Urinary tract infection, site not specified: Secondary | ICD-10-CM

## 2022-08-15 DIAGNOSIS — M9902 Segmental and somatic dysfunction of thoracic region: Secondary | ICD-10-CM | POA: Diagnosis not present

## 2022-08-15 DIAGNOSIS — M9901 Segmental and somatic dysfunction of cervical region: Secondary | ICD-10-CM | POA: Diagnosis not present

## 2022-08-15 LAB — URINE CULTURE: Culture: >=100000 — AB

## 2022-08-15 MED ORDER — SULFAMETHOXAZOLE-TRIMETHOPRIM 800-160 MG PO TABS
1.0000 | ORAL_TABLET | Freq: Two times a day (BID) | ORAL | 0 refills | Status: AC
Start: 2022-08-15 — End: 2022-08-18

## 2022-08-15 NOTE — Progress Notes (Signed)
Attempted to contact patient.

## 2022-08-15 NOTE — Progress Notes (Signed)
Acute UTI on culture. Bactrim sent to pharmacy for patient.

## 2022-08-19 NOTE — Progress Notes (Signed)
#  3 attempts to contact this patient. No answer, straight to voicemail.

## 2022-08-22 DIAGNOSIS — M9902 Segmental and somatic dysfunction of thoracic region: Secondary | ICD-10-CM | POA: Diagnosis not present

## 2022-08-22 DIAGNOSIS — M50323 Other cervical disc degeneration at C6-C7 level: Secondary | ICD-10-CM | POA: Diagnosis not present

## 2022-08-22 DIAGNOSIS — M9901 Segmental and somatic dysfunction of cervical region: Secondary | ICD-10-CM | POA: Diagnosis not present

## 2022-08-27 DIAGNOSIS — M9902 Segmental and somatic dysfunction of thoracic region: Secondary | ICD-10-CM | POA: Diagnosis not present

## 2022-08-27 DIAGNOSIS — M9901 Segmental and somatic dysfunction of cervical region: Secondary | ICD-10-CM | POA: Diagnosis not present

## 2022-08-27 DIAGNOSIS — M50323 Other cervical disc degeneration at C6-C7 level: Secondary | ICD-10-CM | POA: Diagnosis not present

## 2022-08-29 DIAGNOSIS — M50323 Other cervical disc degeneration at C6-C7 level: Secondary | ICD-10-CM | POA: Diagnosis not present

## 2022-08-29 DIAGNOSIS — M9902 Segmental and somatic dysfunction of thoracic region: Secondary | ICD-10-CM | POA: Diagnosis not present

## 2022-08-29 DIAGNOSIS — M9901 Segmental and somatic dysfunction of cervical region: Secondary | ICD-10-CM | POA: Diagnosis not present

## 2022-09-02 ENCOUNTER — Encounter: Payer: Self-pay | Admitting: Family Medicine

## 2022-09-03 DIAGNOSIS — M9902 Segmental and somatic dysfunction of thoracic region: Secondary | ICD-10-CM | POA: Diagnosis not present

## 2022-09-03 DIAGNOSIS — M9901 Segmental and somatic dysfunction of cervical region: Secondary | ICD-10-CM | POA: Diagnosis not present

## 2022-09-03 DIAGNOSIS — M50323 Other cervical disc degeneration at C6-C7 level: Secondary | ICD-10-CM | POA: Diagnosis not present

## 2022-09-10 DIAGNOSIS — M9902 Segmental and somatic dysfunction of thoracic region: Secondary | ICD-10-CM | POA: Diagnosis not present

## 2022-09-10 DIAGNOSIS — M50323 Other cervical disc degeneration at C6-C7 level: Secondary | ICD-10-CM | POA: Diagnosis not present

## 2022-09-10 DIAGNOSIS — M9901 Segmental and somatic dysfunction of cervical region: Secondary | ICD-10-CM | POA: Diagnosis not present

## 2022-09-13 ENCOUNTER — Encounter: Payer: Self-pay | Admitting: Obstetrics and Gynecology

## 2022-09-17 DIAGNOSIS — M9901 Segmental and somatic dysfunction of cervical region: Secondary | ICD-10-CM | POA: Diagnosis not present

## 2022-09-17 DIAGNOSIS — M50323 Other cervical disc degeneration at C6-C7 level: Secondary | ICD-10-CM | POA: Diagnosis not present

## 2022-09-17 DIAGNOSIS — M9902 Segmental and somatic dysfunction of thoracic region: Secondary | ICD-10-CM | POA: Diagnosis not present

## 2022-09-19 DIAGNOSIS — M9902 Segmental and somatic dysfunction of thoracic region: Secondary | ICD-10-CM | POA: Diagnosis not present

## 2022-09-19 DIAGNOSIS — M9901 Segmental and somatic dysfunction of cervical region: Secondary | ICD-10-CM | POA: Diagnosis not present

## 2022-09-19 DIAGNOSIS — M50323 Other cervical disc degeneration at C6-C7 level: Secondary | ICD-10-CM | POA: Diagnosis not present

## 2022-09-24 ENCOUNTER — Encounter: Payer: Self-pay | Admitting: Obstetrics and Gynecology

## 2022-09-24 ENCOUNTER — Ambulatory Visit (INDEPENDENT_AMBULATORY_CARE_PROVIDER_SITE_OTHER): Payer: Medicare Other | Admitting: Obstetrics and Gynecology

## 2022-09-24 VITALS — BP 131/79 | HR 59

## 2022-09-24 DIAGNOSIS — R350 Frequency of micturition: Secondary | ICD-10-CM

## 2022-09-24 DIAGNOSIS — N393 Stress incontinence (female) (male): Secondary | ICD-10-CM

## 2022-09-24 DIAGNOSIS — N3281 Overactive bladder: Secondary | ICD-10-CM

## 2022-09-24 MED ORDER — MIRABEGRON ER 50 MG PO TB24: 50.0000 mg | ORAL_TABLET | Freq: Every day | ORAL | 5 refills | Status: DC

## 2022-09-24 MED ORDER — MIRABEGRON ER 50 MG PO TB24: 50.0000 mg | ORAL_TABLET | Freq: Every day | ORAL | 11 refills | Status: DC

## 2022-09-24 NOTE — Progress Notes (Signed)
[facility] Return Visit  SUBJECTIVE  History of Present Illness: Amanda [name] is a 82 y.o. female seen in follow-up for OAB. Plan at last visit was start trospium. Patient reports the Trospium caused her to have significant dry mouth. She has discontinued this and wanted to re-try Myrbetriq. She reports it is working well.   She has been taking 50mg  daily.     Past Medical History: Patient  has a past medical history of Anemia, Arthritis, Bronchiectasis (HCC), Cancer (HCC), Cervicalgia, Constipation, chronic, Essential hypertension, GERD (gastroesophageal reflux disease), Heart murmur, History of blood transfusion (1959), Hyperlipidemia, Hypertension, Hyperthyroidism, Hypothyroid, Hypothyroidism, Iron deficiency anemia (04/20/2021), Iron malabsorption (04/20/2021), Lumbar burst fracture (HCC), Lymphoproliferative disorder (HCC), Macular degeneration (2013), Macular degeneration, bilateral, Marginal zone lymphoma (HCC), Osteopenia, Pneumonia, Pneumonia due to COVID-19 virus (2021), PONV (postoperative nausea and vomiting), Shingles, Shortness of breath, Spleen enlarged, SUI (stress urinary incontinence, female), Urinary, incontinence, stress female, and Wears glasses.   Past Surgical History: She  has a past surgical history that includes Tonsillectomy and adenoidectomy; Cesarean section (1610); Wisdom tooth extraction; Cataract extraction (2009, 2011); Tubal ligation; Dilation and curettage of uterus; Carpal tunnel release (1999); Colonoscopy; Hysteroscopy with D & C (01/07/2012); Lymph node biopsy (Left, 05/26/2018); IR IMAGING GUIDED PORT INSERTION (07/15/2018); ORIF ankle fracture (Left, 12/12/2018); Cesarean section; Tonsillectomy; Cataract extraction, bilateral; ORIF ankle fracture (Left, 12/12/2018); Breast excisional biopsy (Left, 1980); ORIF ankle fracture (Left, 12/2018); IR REMOVAL TUN ACCESS W/ PORT W/O FL MOD SED (04/11/2021); Eye surgery (Cataracts removed); and Fracture  surgery (12/12/2018).   Medications: She has a current medication list which includes the following prescription(s): acetaminophen, albuterol, amlodipine, aspirin, bupropion, vitamin d3 adult gummies, guaifenesin, ibuprofen, levothyroxine, mirabegron er, pantoprazole, rosuvastatin, and senna-docusate.   Allergies: Patient is allergic to phenergan [promethazine hcl], preservision areds 2 [multiple vitamins-minerals], gabapentin, levaquin [levofloxacin], and pravastatin.   Social History: Patient  reports that she has never smoked. She has never used smokeless tobacco. She reports current alcohol use of about 1.0 standard drink of alcohol per week. She reports that she does not use drugs.      OBJECTIVE     Physical Exam: Vitals:   09/24/22 1257 09/24/22 1259  BP: (!) 143/76 131/79  Pulse: 63 (!) 59   Gen: No apparent distress, A&O x 3.  Detailed Urogynecologic Evaluation:  Deferred.     ASSESSMENT AND PLAN    Amanda [name] is a 82 y.o. with:  1. OAB (overactive bladder)   2. Urinary frequency   3. SUI (stress urinary incontinence, female)    Patient reports she has been able to control many of her OAB symptoms with her Myrbetriq 50mg  and is requesting to stay on this medication for now. Patient reports she will let us know if the medication is not working and she may consider bladder botox.  Patinet reports that SUI is not her largest concern but if she were to need bladder botox in the future she may consider bulking at that time too but is hopeful to not need further intervention.   Patient to return in 1 year for medication follow up or sooner if needed.

## 2022-10-22 ENCOUNTER — Other Ambulatory Visit: Payer: Self-pay | Admitting: Family Medicine

## 2022-10-22 MED ORDER — PANTOPRAZOLE SODIUM 40 MG PO TBEC: 40.0000 mg | DELAYED_RELEASE_TABLET | Freq: Every day | ORAL | 0 refills | Status: DC

## 2022-10-23 ENCOUNTER — Emergency Department (HOSPITAL_BASED_OUTPATIENT_CLINIC_OR_DEPARTMENT_OTHER): Payer: Medicare Other

## 2022-10-23 ENCOUNTER — Encounter (HOSPITAL_BASED_OUTPATIENT_CLINIC_OR_DEPARTMENT_OTHER): Payer: Self-pay | Admitting: Emergency Medicine

## 2022-10-23 ENCOUNTER — Other Ambulatory Visit: Payer: Self-pay

## 2022-10-23 ENCOUNTER — Emergency Department (HOSPITAL_BASED_OUTPATIENT_CLINIC_OR_DEPARTMENT_OTHER)
Admission: EM | Admit: 2022-10-23 | Discharge: 2022-10-23 | Disposition: A | Discharge status: Home / Self Care | Payer: Medicare Other | Attending: Emergency Medicine | Admitting: Emergency Medicine

## 2022-10-23 DIAGNOSIS — U071 COVID-19: Secondary | ICD-10-CM | POA: Insufficient documentation

## 2022-10-23 DIAGNOSIS — R509 Fever, unspecified: Secondary | ICD-10-CM | POA: Diagnosis not present

## 2022-10-23 DIAGNOSIS — R059 Cough, unspecified: Secondary | ICD-10-CM | POA: Diagnosis not present

## 2022-10-23 DIAGNOSIS — Z79899 Other long term (current) drug therapy: Secondary | ICD-10-CM | POA: Insufficient documentation

## 2022-10-23 DIAGNOSIS — J069 Acute upper respiratory infection, unspecified: Secondary | ICD-10-CM | POA: Diagnosis not present

## 2022-10-23 DIAGNOSIS — R051 Acute cough: Secondary | ICD-10-CM

## 2022-10-23 DIAGNOSIS — R918 Other nonspecific abnormal finding of lung field: Secondary | ICD-10-CM | POA: Diagnosis not present

## 2022-10-23 DIAGNOSIS — I700 Atherosclerosis of aorta: Secondary | ICD-10-CM | POA: Diagnosis not present

## 2022-10-23 DIAGNOSIS — Z7982 Long term (current) use of aspirin: Secondary | ICD-10-CM | POA: Diagnosis not present

## 2022-10-23 DIAGNOSIS — R5383 Other fatigue: Secondary | ICD-10-CM | POA: Diagnosis not present

## 2022-10-23 DIAGNOSIS — I10 Essential (primary) hypertension: Secondary | ICD-10-CM | POA: Insufficient documentation

## 2022-10-23 LAB — BASIC METABOLIC PANEL
Anion gap: 12 (ref 5–15)
BUN: 15 mg/dL (ref 8–23)
CO2: 23 mmol/L (ref 22–32)
Calcium: 8.7 mg/dL — ABNORMAL LOW (ref 8.9–10.3)
Chloride: 99 mmol/L (ref 98–111)
Creatinine, Ser: 0.9 mg/dL (ref 0.44–1.00)
GFR, Estimated: >60 mL/min (ref >60)
Glucose, Bld: 114 mg/dL — ABNORMAL HIGH (ref 70–99)
Potassium: 3.7 mmol/L (ref 3.5–5.1)
Sodium: 134 mmol/L — ABNORMAL LOW (ref 135–145)

## 2022-10-23 LAB — RESP PANEL BY RT-PCR (RSV, FLU A&B, COVID)  RVPGX2
Influenza A by PCR: NEGATIVE
Influenza B by PCR: NEGATIVE
Resp Syncytial Virus by PCR: NEGATIVE
SARS Coronavirus 2 by RT PCR: POSITIVE — AB

## 2022-10-23 LAB — CBC WITH DIFFERENTIAL/PLATELET
Abs Immature Granulocytes: 0.02 10*3/uL (ref 0.00–0.07)
Basophils Absolute: 0.1 10*3/uL (ref 0.0–0.1)
Basophils Relative: 1 %
Eosinophils Absolute: 0.1 10*3/uL (ref 0.0–0.5)
Eosinophils Relative: 1 %
HCT: 38.9 % (ref 36.0–46.0)
Hemoglobin: 13.1 g/dL (ref 12.0–15.0)
Immature Granulocytes: 0 %
Lymphocytes Relative: 15 %
Lymphs Abs: 1.1 10*3/uL (ref 0.7–4.0)
MCH: 27.7 pg (ref 26.0–34.0)
MCHC: 33.7 g/dL (ref 30.0–36.0)
MCV: 82.2 fL (ref 80.0–100.0)
Monocytes Absolute: 1 10*3/uL (ref 0.1–1.0)
Monocytes Relative: 15 %
Neutro Abs: 4.9 10*3/uL (ref 1.7–7.7)
Neutrophils Relative %: 68 %
Platelets: 211 10*3/uL (ref 150–400)
RBC: 4.73 MIL/uL (ref 3.87–5.11)
RDW: 13.5 % (ref 11.5–15.5)
WBC: 7.2 10*3/uL (ref 4.0–10.5)
nRBC: 0 % (ref 0.0–0.2)

## 2022-10-23 MED ORDER — PAXLOVID (300/100) 20 X 150 MG & 10 X 100MG PO TBPK
3.0000 | ORAL_TABLET | Freq: Two times a day (BID) | ORAL | 0 refills | Status: AC
Start: 2022-10-23 — End: 2022-10-28

## 2022-10-23 MED ORDER — ACETAMINOPHEN 325 MG PO TABS: 650.0000 mg | ORAL_TABLET | Freq: Once | ORAL | Status: AC

## 2022-10-23 MED ORDER — ACETAMINOPHEN 325 MG PO TABS
650.0000 mg | ORAL_TABLET | Freq: Once | ORAL | Status: AC
  Administered 2022-10-23: 650 mg via ORAL
  Filled 2022-10-23: qty 2

## 2022-10-23 MED ORDER — ACETAMINOPHEN 325 MG PO TABS
ORAL_TABLET | ORAL | Status: AC
  Administered 2022-10-23: 650 mg via ORAL
  Filled 2022-10-23: qty 2

## 2022-10-23 MED ORDER — BENZONATATE 100 MG PO CAPS: 100.0000 mg | ORAL_CAPSULE | Freq: Three times a day (TID) | ORAL | 0 refills | Status: DC | PRN

## 2022-10-23 NOTE — ED Notes (Signed)
Pt requested tesselon pearls prescription, EDP notified

## 2022-10-23 NOTE — ED Provider Notes (Signed)
[facility] Provider Note   CSN: [id number] Arrival date & time: 10/23/22  1804     History  Chief Complaint  Patient presents with   URI    Amanda [name] is a 82 y.o. female.  Amanda [name] is a 82 yo F with PMH HTN HLD who presented to [facility] with a positive Covid test, cough and fatigue. The symptoms started yesterday. She is very tired with an intermittent dry cough. At time of evaluation, she has a fever of 102.6. She denies chills, dyspnea, chest pain, abdominal pain, n/v. She is concerned if she has flu as well. She had Covid once before in 2021 and she was hospitalized, but she does not feel as ill as she did then.   URI Presenting symptoms: cough, fatigue and fever   Presenting symptoms: no congestion and no sore throat   Associated symptoms: headaches   Associated symptoms: no arthralgias, no myalgias and no wheezing        Home Medications Prior to Admission medications   Medication Sig Start Date End Date Taking? Authorizing Provider  nirmatrelvir & ritonavir (PAXLOVID, 300/100,) 20 x 150 MG & 10 x 100MG  TBPK Take 3 tablets by mouth 2 (two) times daily for 5 days. 10/23/22 10/28/22 Yes Saina Waage, Cristal Deer, DO  acetaminophen (TYLENOL) 325 MG tablet Take 1-2 tablets (325-650 mg total) by mouth every 8 (eight) hours as needed for mild pain. 04/19/20   Love, Evlyn Kanner, PA-C  albuterol (VENTOLIN HFA) 108 (90 Base) MCG/ACT inhaler INHALE 2 PUFFS INTO THE LUNGS EVERY 6 HOURS AS NEEDED FOR WHEEZING OR SHORTNESS OF BREATH 01/24/21   Zola Button, Yvonne R, DO  amLODipine (NORVASC) 10 MG tablet TAKE 1 TABLET(10 MG) BY MOUTH DAILY 10/18/21   Donato Schultz, DO  aspirin 81 MG chewable tablet Chew 1 tablet (81 mg total) by mouth daily. 12/25/18   Angiulli, Mcarthur Rossetti, PA-C  buPROPion (WELLBUTRIN XL) 150 MG 24 hr tablet TAKE 1 TABLET(150 MG) BY MOUTH DAILY 12/06/21   Zola Button, Grayling Congress, DO  Cholecalciferol (VITAMIN D3 ADULT  GUMMIES) 25 MCG (1000 UT) CHEW Chew 2 each by mouth daily.    [provider]  guaiFENesin (MUCINEX) 600 MG 12 hr tablet Take 600 mg by mouth daily.    [provider]  ibuprofen (ADVIL) 200 MG tablet Take 200 mg by mouth daily as needed.    [provider]  levothyroxine (SYNTHROID) 125 MCG tablet TAKE 1 TABLET(125 MCG) BY MOUTH DAILY BEFORE BREAKFAST 07/18/22   Zola Button, Grayling Congress, DO  mirabegron ER (MYRBETRIQ) 50 MG TB24 tablet Take 1 tablet (50 mg total) by mouth daily. 09/24/22   Selmer Dominion, NP  pantoprazole (PROTONIX) 40 MG tablet Take 1 tablet (40 mg total) by mouth daily. 10/22/22   Donato Schultz, DO  rosuvastatin (CRESTOR) 5 MG tablet Take 1 tablet by mouth on Monday, Wednesday, and Friday 10/24/21   Zola Button, Grayling Congress, DO  senna-docusate (SENOKOT-S) 8.6-50 MG tablet Take 2 tablets by mouth at bedtime. 04/19/20   Love, Evlyn Kanner, PA-C      Allergies    Phenergan [promethazine hcl], Preservision areds 2 [multiple vitamins-minerals], Gabapentin, Levaquin [levofloxacin], and Pravastatin    Review of Systems   Review of Systems  Constitutional:  Positive for fatigue and fever. Negative for chills.  HENT:  Negative for congestion, sinus pressure and sore throat.   Respiratory:  Positive for cough.  Negative for shortness of breath and wheezing.   Cardiovascular:  Negative for chest pain and palpitations.  Gastrointestinal:  Negative for abdominal pain, diarrhea, nausea and vomiting.  Musculoskeletal:  Negative for arthralgias, joint swelling and myalgias.  Skin:  Negative for rash.  Neurological:  Positive for headaches. Negative for syncope, weakness and light-headedness.  Psychiatric/Behavioral:  Negative for confusion.     Physical Exam Updated Vital Signs BP 126/60   Pulse 68   Temp (!) 102.6 F (39.2 C)   Resp 20   Ht 5' (1.524 m)   Wt 63.5 kg   SpO2 96%   BMI 27.34 kg/m  Physical Exam Constitutional:      General: She is not in  acute distress.    Appearance: Normal appearance. She is not ill-appearing.  HENT:     Head: Normocephalic and atraumatic.     Mouth/Throat:     Mouth: Mucous membranes are moist.     Pharynx: Oropharynx is clear.  Cardiovascular:     Rate and Rhythm: Normal rate and regular rhythm.     Pulses: Normal pulses.     Heart sounds: Normal heart sounds.  Pulmonary:     Effort: Pulmonary effort is normal. No respiratory distress.     Breath sounds: Normal breath sounds. No wheezing.  Abdominal:     General: Abdomen is flat. Bowel sounds are normal.     Palpations: Abdomen is soft.     Tenderness: There is no abdominal tenderness.  Musculoskeletal:     Cervical back: Normal range of motion and neck supple.     Right lower leg: No edema.     Left lower leg: No edema.  Skin:    General: Skin is warm and dry.     Capillary Refill: Capillary refill takes less than 2 seconds.  Neurological:     General: No focal deficit present.     Mental Status: She is alert and oriented to person, place, and time.  Psychiatric:        Mood and Affect: Mood normal.     ED Results / Procedures / Treatments   Labs (all labs ordered are listed, but only abnormal results are displayed) Labs Reviewed  RESP PANEL BY RT-PCR (RSV, FLU A&B, COVID)  [id number] - Abnormal; Notable for the following components:      Result Value   SARS Coronavirus 2 by RT PCR POSITIVE (*)    All other components within normal limits  BASIC METABOLIC PANEL - Abnormal; Notable for the following components:   Sodium 134 (*)    Glucose, Bld 114 (*)    Calcium 8.7 (*)    All other components within normal limits  CBC WITH DIFFERENTIAL/PLATELET    EKG None  Radiology DG Chest Portable 1 View  Result Date: 10/23/2022 CLINICAL DATA:  Cough and fever EXAM: PORTABLE CHEST 1 VIEW COMPARISON:  Radiograph 04/17/2021 FINDINGS: Stable cardiomediastinal silhouette. Aortic atherosclerotic calcification. Bibasilar atelectasis or  infiltrates. Otherwise no focal consolidation, pleural effusion, or pneumothorax. No displaced rib fractures. IMPRESSION: Bibasilar atelectasis or infiltrates. Electronically Signed   By: Minerva Fester M.D.   On: 10/23/2022 19:38    Procedures Procedures    Medications Ordered in ED Medications  acetaminophen (TYLENOL) tablet 650 mg (650 mg Oral Given 10/23/22 1830)  acetaminophen (TYLENOL) tablet 650 mg (650 mg Oral Given 10/23/22 2056)    ED Course/ Medical Decision Making/ A&P Clinical Course as of 10/23/22 2127  Wed Oct 23, 2022  1917 Stable (Resident)  81 YOF with PNA COVID+ at home.  Burgess Estelle is day one. [CC]  2040 Paxlovid [CC]  2041 BP: 138/71 [CC]    Clinical Course User Index [CC] Glyn Ade, MD                                 Medical Decision Making Pt with positive Covid test at home and confirmed here in ED. No co-infections noted by panel. CXR does not suggets pneumonia. No white count. Does not meet SIRS criteria beyond her fever. Saturating well on RA without oxygen requirement. She feels well for discharge home. No indications for hospitalization. Given her age, will provide paxlovid to take at home. Discussed side effects and reviewed her medication list. Rec holding her statin and halving her amlodipine during the medicine course.  Amount and/or Complexity of Data Reviewed Labs: ordered. Radiology: ordered.  Risk OTC drugs. Prescription drug management.         Final Clinical Impression(s) / ED Diagnoses Final diagnoses:  COVID-19  Acute cough  Other fatigue    Rx / DC Orders ED Discharge Orders          Ordered    nirmatrelvir & ritonavir (PAXLOVID, 300/100,) 20 x 150 MG & 10 x 100MG  TBPK  2 times daily        10/23/22 2104              Katheran James, DO 10/23/22 2127    Glyn Ade, MD 10/23/22 2259

## 2022-10-23 NOTE — ED Triage Notes (Signed)
Pt having runny nose, cough, fever, fatigue and body aches since yesteday.  Pt tested positive for covid at home.  Pt requested to be tested for flu/

## 2022-10-23 NOTE — Discharge Instructions (Signed)
Ms Allain, While taking the paxlovid, take only half of your dose of amlodipine and do not take your rosuvastatin. Resume these after your five day course. Best, Dr. Ninfa Meeker

## 2022-10-24 ENCOUNTER — Telehealth: Payer: Self-pay

## 2022-10-24 NOTE — Telephone Encounter (Signed)
Initial Comment Caller states his wife has a headache and nasal congestion. She is positive for COVID. GOTO Facility Not [facility] Translation No Nurse Assessment Nurse: Konrad Dolores, RN, [place] Blas Date/Time Lamount Cohen Time): 10/23/2022 5:13:15 PM Confirm and document reason for call. If symptomatic, describe symptoms. ---Pt gives permission to discuss PHI with me. Pt began with fatigue, feels feverish, headache and nasal congestion and sinus pressure, mild cough. Tested positive for Covid this afternoon Does the patient have any new or worsening symptoms? ---Yes Will a triage be completed? ---Yes Related visit to physician within the last 2 weeks? ---No Does the PT have any chronic conditions? (i.e. diabetes, asthma, this includes High risk factors for pregnancy, etc.) ---No Is this a behavioral health or substance abuse call? ---No Guidelines Guideline Title Affirmed Question Affirmed Notes Nurse Date/Time (Eastern Time) COVID-19 - Diagnosed or Suspected MODERATE difficulty breathing (e.g., speaks in phrases, SOB even at rest, pulse 100-120) Konrad Dolores, RN, [place] 10/23/2022 5:17:53 PM Disp. Time Lamount Cohen Time) Disposition Final User 10/23/2022 5:20:45 PM Go to ED Now Yes Konrad Dolores, RN, [place] Blas PLEASE NOTE: All timestamps contained within this report are represented as [place] Standard Time. CONFIDENTIALTY NOTICE: This fax transmission is intended only for the addressee. It contains information that is legally privileged, confidential or otherwise protected from use or disclosure. If you are not the intended recipient, you are strictly prohibited from reviewing, disclosing, copying using or disseminating any of this information or taking any action in reliance on or regarding this information. If you have received this fax in error, please notify us immediately by telephone so that we can arrange for its return to [place]. Phone: [phone], Toll-Free: [phone], Fax:  [phone] Page: 2 of 2 Call Id: [id number] Final Disposition 10/23/2022 5:20:45 PM Go to ED Now Yes Konrad Dolores, RN, [place] Blas Caller Disagree/Comply Comply Caller Understands Yes PreDisposition InappropriateToAsk Care Advice Given Per Guideline GO TO ED NOW: * You need to be seen in the Emergency Department. * Go to the ED at ___________ Hospital. CARE ADVICE given per COVID-19 - DIAGNOSED OR SUSPECTED (Adult) guideline. Referrals GO TO FACILITY OTHER - SPECIFY

## 2022-10-24 NOTE — Telephone Encounter (Signed)
Pt seen at ED 

## 2022-11-07 DIAGNOSIS — K080 Exfoliation of teeth due to systemic causes: Secondary | ICD-10-CM | POA: Diagnosis not present

## 2022-11-18 ENCOUNTER — Ambulatory Visit (INDEPENDENT_AMBULATORY_CARE_PROVIDER_SITE_OTHER): Payer: Medicare Other | Admitting: Pharmacist

## 2022-11-18 DIAGNOSIS — I10 Essential (primary) hypertension: Secondary | ICD-10-CM

## 2022-11-18 MED ORDER — AMLODIPINE BESYLATE 10 MG PO TABS: ORAL_TABLET | ORAL | 1 refills | Status: DC
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
## 2022-11-20 ENCOUNTER — Encounter: Payer: Self-pay | Admitting: Family Medicine

## 2022-11-22 ENCOUNTER — Ambulatory Visit: Payer: Medicare Other | Admitting: Medical

## 2022-12-12 ENCOUNTER — Encounter: Payer: Self-pay | Admitting: Pharmacist

## 2022-12-12 ENCOUNTER — Other Ambulatory Visit: Payer: Self-pay | Admitting: Family Medicine

## 2022-12-12 ENCOUNTER — Other Ambulatory Visit: Payer: Self-pay | Admitting: Pharmacist

## 2022-12-12 DIAGNOSIS — H18513 Endothelial corneal dystrophy, bilateral: Secondary | ICD-10-CM | POA: Diagnosis not present

## 2022-12-12 DIAGNOSIS — Z961 Presence of intraocular lens: Secondary | ICD-10-CM | POA: Diagnosis not present

## 2022-12-12 MED ORDER — ROSUVASTATIN CALCIUM 5 MG PO TABS: ORAL_TABLET | ORAL | 0 refills | Status: DC

## 2022-12-12 NOTE — Progress Notes (Signed)
Pharmacy Quality Measure Review  This patient is appearing on a report for being at risk of failing the adherence measure for cholesterol (statin) medications this calendar year.   Medication: rosuvastatin  Last fill date: 09/08/2022 for 90 day supply  Reviewed medication indication, dosing, and goals of therapy.  With patient's permission sent in updated Rx for rosuvastatin for refill.   Patient also has reaches Medicare coverage gap and now Myrbetriq cost if higher. There is not currently a medication assistance program for Myrbetriq. Amanda [name] was given samples by her Urology office but is about to run out. She needs to call their office to make appointment to discuss possible procedure and will ask if they have any more samples. If they don't she will contact we and we will check into lower cost options.    Henrene Pastor, PharmD Clinical Pharmacist [facility]

## 2023-01-07 DIAGNOSIS — K080 Exfoliation of teeth due to systemic causes: Secondary | ICD-10-CM | POA: Diagnosis not present

## 2023-01-17 ENCOUNTER — Other Ambulatory Visit: Payer: Self-pay | Admitting: Family Medicine

## 2023-02-05 DIAGNOSIS — L578 Other skin changes due to chronic exposure to nonionizing radiation: Secondary | ICD-10-CM | POA: Diagnosis not present

## 2023-02-05 DIAGNOSIS — D485 Neoplasm of uncertain behavior of skin: Secondary | ICD-10-CM | POA: Diagnosis not present

## 2023-02-05 DIAGNOSIS — L821 Other seborrheic keratosis: Secondary | ICD-10-CM | POA: Diagnosis not present

## 2023-02-05 DIAGNOSIS — D2371 Other benign neoplasm of skin of right lower limb, including hip: Secondary | ICD-10-CM | POA: Diagnosis not present

## 2023-02-05 DIAGNOSIS — C44519 Basal cell carcinoma of skin of other part of trunk: Secondary | ICD-10-CM | POA: Diagnosis not present

## 2023-02-13 ENCOUNTER — Other Ambulatory Visit: Payer: Self-pay | Admitting: Family Medicine

## 2023-02-24 ENCOUNTER — Other Ambulatory Visit: Payer: Self-pay

## 2023-03-16 ENCOUNTER — Other Ambulatory Visit: Payer: Self-pay | Admitting: Family Medicine

## 2023-03-21 ENCOUNTER — Ambulatory Visit: Payer: Medicare Other

## 2023-03-21 ENCOUNTER — Encounter: Payer: Self-pay | Admitting: Family Medicine

## 2023-03-21 ENCOUNTER — Ambulatory Visit (HOSPITAL_BASED_OUTPATIENT_CLINIC_OR_DEPARTMENT_OTHER)
Admission: RE | Admit: 2023-03-21 | Discharge: 2023-03-21 | Disposition: A | Discharge status: Home / Self Care | Payer: Medicare Other | Source: Ambulatory Visit | Attending: Family Medicine | Admitting: Family Medicine

## 2023-03-21 ENCOUNTER — Ambulatory Visit: Payer: Medicare Other | Admitting: Family Medicine

## 2023-03-21 VITALS — BP 122/60 | HR 41 | Temp 98.1°F | Resp 18 | Ht 60.0 in | Wt 150.0 lb

## 2023-03-21 DIAGNOSIS — G8929 Other chronic pain: Secondary | ICD-10-CM | POA: Diagnosis not present

## 2023-03-21 DIAGNOSIS — M549 Dorsalgia, unspecified: Secondary | ICD-10-CM

## 2023-03-21 DIAGNOSIS — R0602 Shortness of breath: Secondary | ICD-10-CM | POA: Diagnosis not present

## 2023-03-21 DIAGNOSIS — N393 Stress incontinence (female) (male): Secondary | ICD-10-CM | POA: Diagnosis not present

## 2023-03-21 DIAGNOSIS — E039 Hypothyroidism, unspecified: Secondary | ICD-10-CM | POA: Diagnosis not present

## 2023-03-21 DIAGNOSIS — M542 Cervicalgia: Secondary | ICD-10-CM

## 2023-03-21 LAB — COMPREHENSIVE METABOLIC PANEL
ALT: 13 U/L (ref 0–35)
AST: 17 U/L (ref 0–37)
Albumin: 4.6 g/dL (ref 3.5–5.2)
Alkaline Phosphatase: 88 U/L (ref 39–117)
BUN: 24 mg/dL — ABNORMAL HIGH (ref 6–23)
CO2: 28 meq/L (ref 19–32)
Calcium: 9.5 mg/dL (ref 8.4–10.5)
Chloride: 103 meq/L (ref 96–112)
Creatinine, Ser: 1.04 mg/dL (ref 0.40–1.20)
GFR: 49.45 mL/min — ABNORMAL LOW (ref >60.00)
Glucose, Bld: 90 mg/dL (ref 70–99)
Potassium: 4.9 meq/L (ref 3.5–5.1)
Sodium: 140 meq/L (ref 135–145)
Total Bilirubin: 0.4 mg/dL (ref 0.2–1.2)
Total Protein: 6.9 g/dL (ref 6.0–8.3)

## 2023-03-21 LAB — CBC WITH DIFFERENTIAL/PLATELET
Basophils Absolute: 0.1 10*3/uL (ref 0.0–0.1)
Basophils Relative: 0.8 % (ref 0.0–3.0)
Eosinophils Absolute: 0.3 10*3/uL (ref 0.0–0.7)
Eosinophils Relative: 3.7 % (ref 0.0–5.0)
HCT: 41.7 % (ref 36.0–46.0)
Hemoglobin: 13.7 g/dL (ref 12.0–15.0)
Lymphocytes Relative: 28.8 % (ref 12.0–46.0)
Lymphs Abs: 2 10*3/uL (ref 0.7–4.0)
MCHC: 32.9 g/dL (ref 30.0–36.0)
MCV: 85 fL (ref 78.0–100.0)
Monocytes Absolute: 0.8 10*3/uL (ref 0.1–1.0)
Monocytes Relative: 11.6 % (ref 3.0–12.0)
Neutro Abs: 3.8 10*3/uL (ref 1.4–7.7)
Neutrophils Relative %: 55.1 % (ref 43.0–77.0)
Platelets: 282 10*3/uL (ref 150.0–400.0)
RBC: 4.9 Mil/uL (ref 3.87–5.11)
RDW: 14.4 % (ref 11.5–15.5)
WBC: 6.9 10*3/uL (ref 4.0–10.5)

## 2023-03-21 LAB — TSH: TSH: 4.62 u[IU]/mL (ref 0.35–5.50)

## 2023-03-21 MED ORDER — BUPROPION HCL ER (XL) 150 MG PO TB24: 150.0000 mg | ORAL_TABLET | Freq: Every day | ORAL | 3 refills | Status: DC

## 2023-03-21 NOTE — Patient Instructions (Signed)
Urinary Frequency, Adult Urinary frequency means urinating more often than usual. You may urinate every 1-2 hours even though you drink a normal amount of fluid and do not have a bladder infection or condition. Although you urinate more often than normal, the total amount of urine produced in a day is normal. With urinary frequency, you may have an urgent need to urinate often. The stress and anxiety of needing to find a bathroom quickly can make this urge worse. This condition may go away on its own, or you may need treatment at home. Home treatment may include bladder training, exercises, taking medicines, or making changes to your diet. Follow these instructions at home: Bladder health Your health care provider will tell you what to do to improve bladder health. You may be told to: Keep a bladder diary. Keep track of: What you eat and drink. How often you urinate. How much you urinate. Follow a bladder training program. This may include: Learning to delay going to the bathroom. Double urinating, also called voiding. This helps if you are not completely emptying your bladder. Scheduled voiding. Do Kegel exercises. Kegel exercises strengthen the muscles that help control urination, which may help the condition.  Eating and drinking Follow instructions from your health care provider about eating or drinking restrictions. You may be told to: Avoid caffeine. Drink fewer fluids, especially alcohol. Avoid drinking in the evening. Avoid foods or drinks that may irritate the bladder. These include coffee, tea, soda, artificial sweeteners, citrus, tomato-based foods, and chocolate. Eat foods that help prevent or treat constipation. Constipation can make urinary frequency worse. You may need to take these actions to prevent or treat constipation: Drink enough fluid to keep your urine pale yellow. Take over-the-counter or prescription medicines. Eat foods that are high in fiber, such as beans, whole  grains, and fresh fruits and vegetables. Limit foods that are high in fat and processed sugars, such as fried or sweet foods. General instructions Take over-the-counter and prescription medicines only as told by your health care provider. Keep all follow-up visits. This is important. Contact a health care provider if: You start urinating more often. You feel pain or irritation when you urinate. You notice blood in your urine. Your urine looks cloudy. You develop a fever. You begin vomiting. Get help right away if: You are unable to urinate. Summary Urinary frequency means urinating more often than usual. With urinary frequency, you may urinate every 1-2 hours even though you drink a normal amount of fluid and do not have a bladder infection or other bladder condition. Your health care provider may recommend that you keep a bladder diary, follow a bladder training program, or make dietary changes. If told by your health care provider, do Kegel exercises to strengthen the muscles that help control urination. Take over-the-counter and prescription medicines only as told by your health care provider. Contact a health care provider if your symptoms do not improve or get worse. This information is not intended to replace advice given to you by your health care provider. Make sure you discuss any questions you have with your health care provider. Document Revised: 08/26/2019 Document Reviewed: 09/24/2019 Elsevier Patient Education  2024 [employer].
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
## 2023-03-21 NOTE — Assessment & Plan Note (Signed)
F/u with urogyn to discuss further  And proceed with pelvic floor rehab vs botox

## 2023-03-23 ENCOUNTER — Encounter: Payer: Self-pay | Admitting: Family Medicine

## 2023-03-25 ENCOUNTER — Ambulatory Visit (HOSPITAL_BASED_OUTPATIENT_CLINIC_OR_DEPARTMENT_OTHER)
Admission: RE | Admit: 2023-03-25 | Discharge: 2023-03-25 | Disposition: A | Discharge status: Home / Self Care | Payer: Medicare Other | Source: Ambulatory Visit | Attending: Family Medicine | Admitting: Family Medicine

## 2023-03-25 DIAGNOSIS — R0602 Shortness of breath: Secondary | ICD-10-CM

## 2023-03-25 LAB — ECHOCARDIOGRAM COMPLETE
AR max vel: 1.96 cm2
AV Area VTI: 1.9 cm2
AV Area mean vel: 1.88 cm2
AV Mean grad: 2 mm[Hg]
AV Peak grad: 4.2 mm[Hg]
Ao pk vel: 1.02 m/s
Area-P 1/2: 4.36 cm2
Calc EF: 61.5 %
S' Lateral: 2.5 cm
Single Plane A2C EF: 60.7 %
Single Plane A4C EF: 61.8 %

## 2023-03-26 ENCOUNTER — Telehealth: Payer: Self-pay | Admitting: Physical Medicine and Rehabilitation

## 2023-03-26 NOTE — Telephone Encounter (Signed)
Patient returned call asked for a call back. The number to contact patient is [phone]

## 2023-03-29 ENCOUNTER — Encounter: Payer: Self-pay | Admitting: Family Medicine

## 2023-04-01 ENCOUNTER — Ambulatory Visit: Payer: Medicare Other | Admitting: Physical Medicine and Rehabilitation

## 2023-04-01 ENCOUNTER — Encounter: Payer: Self-pay | Admitting: Physical Medicine and Rehabilitation

## 2023-04-01 DIAGNOSIS — M47816 Spondylosis without myelopathy or radiculopathy, lumbar region: Secondary | ICD-10-CM

## 2023-04-01 DIAGNOSIS — S32000S Wedge compression fracture of unspecified lumbar vertebra, sequela: Secondary | ICD-10-CM

## 2023-04-01 DIAGNOSIS — M810 Age-related osteoporosis without current pathological fracture: Secondary | ICD-10-CM

## 2023-04-01 DIAGNOSIS — G8929 Other chronic pain: Secondary | ICD-10-CM

## 2023-04-01 DIAGNOSIS — M545 Low back pain, unspecified: Secondary | ICD-10-CM | POA: Diagnosis not present
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
## 2023-04-07 ENCOUNTER — Encounter: Payer: Self-pay | Admitting: Physician Assistant

## 2023-04-07 ENCOUNTER — Ambulatory Visit (INDEPENDENT_AMBULATORY_CARE_PROVIDER_SITE_OTHER): Payer: Medicare Other | Admitting: Physician Assistant

## 2023-04-07 VITALS — Ht 62.0 in | Wt 147.2 lb

## 2023-04-07 DIAGNOSIS — M810 Age-related osteoporosis without current pathological fracture: Secondary | ICD-10-CM | POA: Diagnosis not present
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
## 2023-04-08 ENCOUNTER — Encounter: Payer: Self-pay | Admitting: Physical Medicine and Rehabilitation

## 2023-04-08 ENCOUNTER — Encounter: Payer: Self-pay | Admitting: Hematology & Oncology

## 2023-04-15 ENCOUNTER — Encounter: Payer: Self-pay | Admitting: Obstetrics

## 2023-04-15 ENCOUNTER — Ambulatory Visit: Payer: Medicare Other | Admitting: Obstetrics

## 2023-04-15 VITALS — BP 144/87 | HR 66

## 2023-04-15 DIAGNOSIS — N3946 Mixed incontinence: Secondary | ICD-10-CM | POA: Insufficient documentation

## 2023-04-15 DIAGNOSIS — K5909 Other constipation: Secondary | ICD-10-CM | POA: Diagnosis not present

## 2023-04-15 MED ORDER — GEMTESA 75 MG PO TABS
75.0000 mg | ORAL_TABLET | Freq: Every day | ORAL | Status: DC
Start: 2023-04-15 — End: 2023-06-26

## 2023-04-15 MED ORDER — SULFAMETHOXAZOLE-TRIMETHOPRIM 800-160 MG PO TABS
1.0000 | ORAL_TABLET | Freq: Two times a day (BID) | ORAL | 0 refills | Status: DC
Start: 2023-04-15 — End: 2023-04-15

## 2023-04-15 MED ORDER — GEMTESA 75 MG PO TABS
75.0000 mg | ORAL_TABLET | Freq: Every day | ORAL | 2 refills | Status: DC
Start: 2023-04-15 — End: 2023-06-26

## 2023-04-15 MED ORDER — SULFAMETHOXAZOLE-TRIMETHOPRIM 800-160 MG PO TABS: 1.0000 | ORAL_TABLET | Freq: Two times a day (BID) | ORAL | 0 refills | Status: DC

## 2023-04-15 NOTE — Progress Notes (Signed)
Urogynecology Return Visit  SUBJECTIVE  History of Present Illness: Amanda [name] is a 83 y.o. female seen in follow-up for urgency dominate mixed urinary incontinence. Plan at last visit was continue mirabegron.   Failed Oxybutynin 10mg  XL, Trospium due to dry mouth Resumed mirabegron 50mg  with relief, stopped around 2 months ago due to cost Leaks  5-6x/day with coughing/sneezing with smaller volume leakage Leaks 1x/days with urgency mostly at night when she is watching TV at night and with position change Use 1 pads per day, denies leakage onto clothing Bowel movements: 3x/week with hard stool up to 2x/day Stopped senna PRN  Tried metamucil with bloating Denies use of miralax  Past Medical History: Patient  has a past medical history of Anemia, Arthritis, Bronchiectasis (HCC), Cancer (HCC), Cervicalgia, Constipation, chronic, Essential hypertension, GERD (gastroesophageal reflux disease), Heart murmur, History of blood transfusion (1959), Hyperlipidemia, Hypertension, Hyperthyroidism, Hypothyroid, Hypothyroidism, Iron deficiency anemia (04/20/2021), Iron malabsorption (04/20/2021), Lumbar burst fracture (HCC), Lymphoproliferative disorder (HCC), Macular degeneration (2013), Macular degeneration, bilateral, Marginal zone lymphoma (HCC), Osteopenia, Pneumonia, Pneumonia due to COVID-19 virus (2021), PONV (postoperative nausea and vomiting), Shingles, Shortness of breath, Spleen enlarged, SUI (stress urinary incontinence, female), Urinary, incontinence, stress female, and Wears glasses.   Past Surgical History: She  has a past surgical history that includes Tonsillectomy and adenoidectomy; Cesarean section (1610); Wisdom tooth extraction; Cataract extraction (2009, 2011); Tubal ligation; Dilation and curettage of uterus; Carpal tunnel release (1999); Colonoscopy; Hysteroscopy with D & C (01/07/2012); Lymph node biopsy (Left, 05/26/2018); IR IMAGING GUIDED PORT INSERTION (07/15/2018);  ORIF ankle fracture (Left, 12/12/2018); Cesarean section; Tonsillectomy; Cataract extraction, bilateral; ORIF ankle fracture (Left, 12/12/2018); Breast excisional biopsy (Left, 1980); ORIF ankle fracture (Left, 12/2018); IR REMOVAL TUN ACCESS W/ PORT W/O FL MOD SED (04/11/2021); Eye surgery (Cataracts removed); and Fracture surgery (12/12/2018).   Medications: She has a current medication list which includes the following prescription(s): acetaminophen, albuterol, amlodipine, aspirin, bupropion, qc calcium 500mg -d3, vitamin d3 adult gummies, guaifenesin, ibuprofen, levothyroxine, pantoprazole, rosuvastatin, senna-docusate, gemtesa, gemtesa, and sulfamethoxazole-trimethoprim.   Allergies: Patient is allergic to phenergan [promethazine hcl], preservision areds 2 [multiple vitamins-minerals], gabapentin, levaquin [levofloxacin], and pravastatin.   Social History: Patient  reports that she has never smoked. She has never used smokeless tobacco. She reports current alcohol use of about 1.0 standard drink of alcohol per week. She reports that she does not use drugs.     OBJECTIVE     Physical Exam: Vitals:   04/15/23 1346  BP: (!) 144/87  Pulse: 66   Gen: No apparent distress, A&O x 3.  Detailed Urogynecologic Evaluation:  Deferred. Prior exam showed: + CST       ASSESSMENT AND PLAN    Amanda [name] is a 83 y.o. with:  1. Urinary incontinence, mixed   2. Chronic constipation     Urinary incontinence, mixed Assessment & Plan: - failed Oxybutynin 10mg  XL, Trospium due to dry mouth - mirabegron 50mg  with relief, stopped due to cost - stress > urgency - For treatment of stress urinary incontinence,  non-surgical options include expectant management, weight loss, physical therapy, as well as a pessary.  Surgical options include a midurethral sling, Burch urethropexy, and transurethral injection of a bulking agent. - She prefers an office procedure with urethral bulking (Bulkamid). We  discussed success rate of approximately 70-80% and possible need for second injection. We reviewed that this is not a permanent procedure and the Bulkamid does dissolve over time. Risks reviewed including injury to bladder or urethra,  UTI, urinary retention and hematuria.  - We discussed the symptoms of overactive bladder (OAB), which include urinary urgency, urinary frequency, nocturia, with or without urge incontinence.  While we do not know the exact etiology of OAB, several treatment options exist. We discussed management including behavioral therapy (decreasing bladder irritants, urge suppression strategies, timed voids, bladder retraining), physical therapy, medication; for refractory cases posterior tibial nerve stimulation, sacral neuromodulation, and intravesical botulinum toxin injection.  For anticholinergic medications, we discussed the potential side effects of anticholinergics including dry eyes, dry mouth, constipation, cognitive impairment and urinary retention. For Beta-3 agonist medication, we discussed the potential side effect of elevated blood pressure which is more likely to occur in individuals with uncontrolled hypertension. - samples and Rx provided for trial of gemtesa - For refractory OAB we reviewed the procedure for intravesical Botox injection with cystoscopy in the office and reviewed the risks, benefits and alternatives of treatment including but not limited to infection, need for self-catheterization and need for repeat therapy.  We discussed that there is a 5-15% chance of needing to catheterize with Botox and that this usually resolves in a few months; however can persist for longer periods of time.  Typically Botox injections would need to be repeated every 3-12 months since this is not a permanent therapy.   We discussed the role of sacral neuromodulation and how it works. It requires a test phase, and documentation of bladder function via diary. After a successful test  period, a permanent wire and generator are placed in the OR. The battery lasts 5 years on average and would need to be replaced surgically.  The goal of this therapy is at least a 50% improvement in symptoms. It is NOT realistic to expect a 100% cure.  We reviewed the fact that about 30% of patients fail the test phase and are not candidates for permanent generator placement.  We discussed the risk of infection and that the patient would not be able to get an MRI once the device is placed. There are two companies that provide this therapy: [employer] and [employer]. [employer]' product is new and is similar to [employer]'s, but has advantages of a smaller and rechargeable battery and being able to have an MRI with the implant. For all procedures, we discussed risks of bleeding, infection, damage to surrounding organs including bowel, bladder, blood vessels, ureters and nerves, need for further surgery, risk of postoperative urinary incontinence or retention with need to catheterize, recurrent prolapse, numbness and weakness at any body site, buttock pain, and the rarer risks of blood clot, heart attack, pneumonia, death.    We also discussed the role of percutaneous tibial nerve stimulation and how it works.  She understands it requires 12 weekly visits for temporary neuromodulation of the sacral nerve roots via the tibial nerve and that she may then require continued tapered treatment.  She will return for the procedure. All questions were answered. - pt desires to proceed with urethral bulking alone, understands possible need for additional treatment for OAB post-procedure - Rx bactrim sent, patient to take 1 dose on the day of procedure   Orders: Leslye Peer; Take 1 tablet (75 mg total) by mouth daily.  Dispense: 28 tablet -     Gemtesa; Take 1 tablet (75 mg total) by mouth daily.  Dispense: 30 tablet; Refill: 2  Chronic constipation Assessment & Plan: - since childhood - For constipation, we reviewed the  importance of a better bowel regimen.  We  also discussed the importance of avoiding chronic straining, as it can exacerbate her pelvic floor symptoms; we discussed treating constipation and straining prior to surgery, as postoperative straining can lead to damage to the repair and recurrence of symptoms. We discussed initiating therapy with increasing fluid intake, fiber supplementation, stool softeners, and laxatives such as miralax.  - encourage titration of miralax - discussed association with urinary incontinence   Other orders -     Sulfamethoxazole-Trimethoprim; Take 1 tablet by mouth 2 (two) times daily.  Dispense: 2 tablet; Refill: 0   Time spent: I spent 36 minutes dedicated to the care of this patient on the date of this encounter to include pre-visit review of records, face-to-face time with the patient discussing mixed urinary incontinence, chronic constipation, and post visit documentation and ordering medication/ testing.    Loleta Chance, MD

## 2023-04-15 NOTE — Assessment & Plan Note (Addendum)
-   since childhood - For constipation, we reviewed the importance of a better bowel regimen.  We also discussed the importance of avoiding chronic straining, as it can exacerbate her pelvic floor symptoms; we discussed treating constipation and straining prior to surgery, as postoperative straining can lead to damage to the repair and recurrence of symptoms. We discussed initiating therapy with increasing fluid intake, fiber supplementation, stool softeners, and laxatives such as miralax.  - encourage titration of miralax - discussed association with urinary incontinence

## 2023-04-15 NOTE — Patient Instructions (Addendum)
We discussed the symptoms of overactive bladder (OAB), which include urinary urgency, urinary frequency, night-time urination, with or without urge incontinence.  We discussed management including behavioral therapy (decreasing bladder irritants by following a bladder diet, urge suppression strategies, timed voids, bladder retraining), physical therapy, medication; and for refractory cases posterior tibial nerve stimulation, sacral neuromodulation, and intravesical botulinum toxin injection.   For Beta-3 agonist medication, we discussed the potential side effect of elevated blood pressure which is more likely to occur in individuals with uncontrolled hypertension. You were given samples for Gemtesa 75 mg.  It can take a month to start working so give it time, but if you have bothersome side effects call sooner and we can try a different medication.  Call us if you have trouble filling the prescription or if it's not covered by your insurance.  We discussed an office procedure with urethral bulking (Bulkamid). We discussed success rate of approximately 70-80% and possible need for second injection. We reviewed that this is not a permanent procedure and the Bulkamid does dissolve over time. Risks reviewed including injury to bladder or urethra, UTI, urinary retention and hematuria.   Please call if you experience UTI symptoms prior to your procedure.   Take 1 dose of Bactrim by mouth on the morning of your procedure

## 2023-04-15 NOTE — Assessment & Plan Note (Addendum)
- failed Oxybutynin 10mg  XL, Trospium due to dry mouth - mirabegron 50mg  with relief, stopped due to cost - stress > urgency - For treatment of stress urinary incontinence,  non-surgical options include expectant management, weight loss, physical therapy, as well as a pessary.  Surgical options include a midurethral sling, Burch urethropexy, and transurethral injection of a bulking agent. - She prefers an office procedure with urethral bulking (Bulkamid). We discussed success rate of approximately 70-80% and possible need for second injection. We reviewed that this is not a permanent procedure and the Bulkamid does dissolve over time. Risks reviewed including injury to bladder or urethra, UTI, urinary retention and hematuria.  - We discussed the symptoms of overactive bladder (OAB), which include urinary urgency, urinary frequency, nocturia, with or without urge incontinence.  While we do not know the exact etiology of OAB, several treatment options exist. We discussed management including behavioral therapy (decreasing bladder irritants, urge suppression strategies, timed voids, bladder retraining), physical therapy, medication; for refractory cases posterior tibial nerve stimulation, sacral neuromodulation, and intravesical botulinum toxin injection.  For anticholinergic medications, we discussed the potential side effects of anticholinergics including dry eyes, dry mouth, constipation, cognitive impairment and urinary retention. For Beta-3 agonist medication, we discussed the potential side effect of elevated blood pressure which is more likely to occur in individuals with uncontrolled hypertension. - samples and Rx provided for trial of gemtesa - For refractory OAB we reviewed the procedure for intravesical Botox injection with cystoscopy in the office and reviewed the risks, benefits and alternatives of treatment including but not limited to infection, need for self-catheterization and need for repeat  therapy.  We discussed that there is a 5-15% chance of needing to catheterize with Botox and that this usually resolves in a few months; however can persist for longer periods of time.  Typically Botox injections would need to be repeated every 3-12 months since this is not a permanent therapy.   We discussed the role of sacral neuromodulation and how it works. It requires a test phase, and documentation of bladder function via diary. After a successful test period, a permanent wire and generator are placed in the OR. The battery lasts 5 years on average and would need to be replaced surgically.  The goal of this therapy is at least a 50% improvement in symptoms. It is NOT realistic to expect a 100% cure.  We reviewed the fact that about 30% of patients fail the test phase and are not candidates for permanent generator placement.  We discussed the risk of infection and that the patient would not be able to get an MRI once the device is placed. There are two companies that provide this therapy: [employer] and [employer]. [employer]' product is new and is similar to [employer]'s, but has advantages of a smaller and rechargeable battery and being able to have an MRI with the implant. For all procedures, we discussed risks of bleeding, infection, damage to surrounding organs including bowel, bladder, blood vessels, ureters and nerves, need for further surgery, risk of postoperative urinary incontinence or retention with need to catheterize, recurrent prolapse, numbness and weakness at any body site, buttock pain, and the rarer risks of blood clot, heart attack, pneumonia, death.    We also discussed the role of percutaneous tibial nerve stimulation and how it works.  She understands it requires 12 weekly visits for temporary neuromodulation of the sacral nerve roots via the tibial nerve and that she may then require continued tapered treatment.  She  will return for the procedure. All questions were answered. - pt desires  to proceed with urethral bulking alone, understands possible need for additional treatment for OAB post-procedure - Rx bactrim sent, patient to take 1 dose on the day of procedure

## 2023-04-16 ENCOUNTER — Ambulatory Visit
Admission: RE | Admit: 2023-04-16 | Discharge: 2023-04-16 | Disposition: A | Discharge status: Home / Self Care | Payer: Medicare Other | Source: Ambulatory Visit | Attending: Physical Medicine and Rehabilitation | Admitting: Physical Medicine and Rehabilitation

## 2023-04-16 ENCOUNTER — Other Ambulatory Visit: Payer: Self-pay | Admitting: Family Medicine

## 2023-04-16 DIAGNOSIS — S32000S Wedge compression fracture of unspecified lumbar vertebra, sequela: Secondary | ICD-10-CM

## 2023-04-16 DIAGNOSIS — G8929 Other chronic pain: Secondary | ICD-10-CM

## 2023-04-16 DIAGNOSIS — M47816 Spondylosis without myelopathy or radiculopathy, lumbar region: Secondary | ICD-10-CM

## 2023-04-16 DIAGNOSIS — M810 Age-related osteoporosis without current pathological fracture: Secondary | ICD-10-CM

## 2023-04-17 ENCOUNTER — Encounter: Payer: Self-pay | Admitting: Obstetrics

## 2023-04-22 DIAGNOSIS — C44519 Basal cell carcinoma of skin of other part of trunk: Secondary | ICD-10-CM | POA: Diagnosis not present

## 2023-04-22 DIAGNOSIS — L988 Other specified disorders of the skin and subcutaneous tissue: Secondary | ICD-10-CM | POA: Diagnosis not present

## 2023-05-05 ENCOUNTER — Encounter: Payer: Self-pay | Admitting: Physical Medicine and Rehabilitation

## 2023-05-05 ENCOUNTER — Ambulatory Visit: Payer: Medicare Other | Admitting: Physical Medicine and Rehabilitation

## 2023-05-05 VITALS — BP 130/77 | HR 69

## 2023-05-05 DIAGNOSIS — G8929 Other chronic pain: Secondary | ICD-10-CM | POA: Diagnosis not present

## 2023-05-05 DIAGNOSIS — M47816 Spondylosis without myelopathy or radiculopathy, lumbar region: Secondary | ICD-10-CM

## 2023-05-05 DIAGNOSIS — M545 Low back pain, unspecified: Secondary | ICD-10-CM | POA: Diagnosis not present

## 2023-05-05 DIAGNOSIS — M810 Age-related osteoporosis without current pathological fracture: Secondary | ICD-10-CM | POA: Diagnosis not present

## 2023-05-05 NOTE — Progress Notes (Unsigned)
 Pain Score-2
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
## 2023-05-13 DIAGNOSIS — M545 Low back pain, unspecified: Secondary | ICD-10-CM | POA: Diagnosis not present

## 2023-05-13 DIAGNOSIS — M542 Cervicalgia: Secondary | ICD-10-CM | POA: Diagnosis not present

## 2023-05-13 NOTE — Progress Notes (Unsigned)
 PA request was submitted through [facility] Provider Portal for procedure : Urethral Bulking CPT code: 47425 and [phone] PA is needed for this procedure. PA request was: NO PA REQUIRED

## 2023-05-15 ENCOUNTER — Other Ambulatory Visit: Payer: Self-pay | Admitting: Family Medicine

## 2023-05-15 DIAGNOSIS — M542 Cervicalgia: Secondary | ICD-10-CM | POA: Diagnosis not present

## 2023-05-15 DIAGNOSIS — M545 Low back pain, unspecified: Secondary | ICD-10-CM | POA: Diagnosis not present

## 2023-05-15 DIAGNOSIS — I10 Essential (primary) hypertension: Secondary | ICD-10-CM

## 2023-05-22 DIAGNOSIS — M545 Low back pain, unspecified: Secondary | ICD-10-CM | POA: Diagnosis not present

## 2023-05-22 DIAGNOSIS — M542 Cervicalgia: Secondary | ICD-10-CM | POA: Diagnosis not present

## 2023-05-27 ENCOUNTER — Other Ambulatory Visit: Payer: Self-pay

## 2023-05-27 ENCOUNTER — Ambulatory Visit: Admitting: Physical Medicine and Rehabilitation

## 2023-05-27 VITALS — BP 123/75 | HR 72

## 2023-05-27 DIAGNOSIS — M47816 Spondylosis without myelopathy or radiculopathy, lumbar region: Secondary | ICD-10-CM

## 2023-05-27 DIAGNOSIS — M542 Cervicalgia: Secondary | ICD-10-CM | POA: Diagnosis not present

## 2023-05-27 DIAGNOSIS — M545 Low back pain, unspecified: Secondary | ICD-10-CM | POA: Diagnosis not present

## 2023-05-27 MED ORDER — BUPIVACAINE HCL 0.5 % IJ SOLN
3.0000 mL | Freq: Once | INTRAMUSCULAR | Status: AC
  Administered 2023-05-27: 3 mL

## 2023-05-27 NOTE — Progress Notes (Unsigned)
 Pain Scale   Average Pain 5   Pain Diary #1     +Driver, -BT, -Dye Allergies.

## 2023-05-27 NOTE — Patient Instructions (Signed)

## 2023-05-29 DIAGNOSIS — M545 Low back pain, unspecified: Secondary | ICD-10-CM | POA: Diagnosis not present

## 2023-05-29 DIAGNOSIS — M542 Cervicalgia: Secondary | ICD-10-CM | POA: Diagnosis not present

## 2023-05-30 NOTE — Progress Notes (Signed)
 Amanda [name] - 83 y.o. female MRN [id number]  Date of birth: [birth year]  Office Visit Note: Visit Date: 05/27/2023 PCP: Donato Schultz, DO Referred by: Donato Schultz, *  Subjective: Chief Complaint  Patient presents with   Lower Back - Pain   HPI:  Amanda [name] is a 83 y.o. female who comes in today at the request of Ellin Goodie, FNP for planned Bilateral  L4-5 and L5-S1 Lumbar facet/medial branch block with fluoroscopic guidance.  The patient has failed conservative care including home exercise, medications, time and activity modification.  This injection will be diagnostic and hopefully therapeutic.  Please see requesting physician notes for further details and justification.  Exam has shown concordant pain with facet joint loading.   ROS Otherwise per HPI.  Assessment & Plan: Visit Diagnoses:    ICD-10-CM   1. Facet arthropathy, lumbar  M47.816 XR C-ARM NO REPORT    Facet Injection    bupivacaine (MARCAINE) 0.5 % (with pres) injection 3 mL      Plan: No additional findings.   Meds & Orders:  Meds ordered this encounter  Medications   bupivacaine (MARCAINE) 0.5 % (with pres) injection 3 mL    Orders Placed This Encounter  Procedures   Facet Injection   XR C-ARM NO REPORT    Follow-up: Return for Review Pain Diary.   Procedures: No procedures performed  Lumbar Diagnostic Facet Joint Nerve Block with Fluoroscopic Guidance   Patient: Amanda [name]      Date of Birth: 1940/05/13 MRN: [id number] PCP: Donato Schultz, DO      Visit Date: 05/27/2023   Universal Protocol:    Date/Time: 03/28/258:34 AM  Consent Given By: the patient  Position: PRONE  Additional Comments: Vital signs were monitored before and after the procedure. Patient was prepped and draped in the usual sterile fashion. The correct patient, procedure, and site was verified.   Injection Procedure Details:   Procedure diagnoses:  1. Facet arthropathy, lumbar       Meds Administered:  Meds ordered this encounter  Medications   bupivacaine (MARCAINE) 0.5 % (with pres) injection 3 mL     Laterality: Bilateral  Location/Site: L4-L5, L3 and L4 medial branches and L5-S1, L4 medial branch and L5 dorsal ramus  Needle: 5.0 in., 25 ga.  Short bevel or Quincke spinal needle  Needle Placement: Oblique pedical  Findings:   -Comments: There was excellent flow of contrast along the articular pillars without intravascular flow.  Procedure Details: The fluoroscope beam is vertically oriented in AP and then obliqued 15 to 20 degrees to the ipsilateral side of the desired nerve to achieve the "Scotty dog" appearance.  The skin over the target area of the junction of the superior articulating process and the transverse process (sacral ala if blocking the L5 dorsal rami) was locally anesthetized with a 1 ml volume of 1% Lidocaine without Epinephrine.  The spinal needle was inserted and advanced in a trajectory view down to the target.   After contact with periosteum and negative aspirate for blood and CSF, correct placement without intravascular or epidural spread was confirmed by injecting 0.5 ml. of Isovue-250.  A spot radiograph was obtained of this image.    Next, a 0.5 ml. volume of the injectate described above was injected. The needle was then redirected to the other facet joint nerves mentioned above if needed.  Prior to the procedure, the patient was given a Pain Diary which was  completed for baseline measurements.  After the procedure, the patient rated their pain every 30 minutes and will continue rating at this frequency for a total of 5 hours.  The patient has been asked to complete the Diary and return to [place] by mail, fax or hand delivered as soon as possible.   Additional Comments:  No complications occurred Dressing: 2 x 2 sterile gauze and Band-Aid    Post-procedure details: Patient was observed during the procedure. Post-procedure  instructions were reviewed.  Patient left the clinic in stable condition.   Clinical History: CLINICAL DATA:  Low back pain for 6 weeks despite treatment   For the purposes of this dictation, the lowest well-formed intervertebral disc spaces presumed to be the L5-S1 level, and there presumed to be 5 lumbar type vertebral bodies.   EXAM: MRI LUMBAR SPINE WITHOUT CONTRAST   TECHNIQUE: Multiplanar, multisequence MR imaging of the lumbar spine was performed. No intravenous contrast was administered.   COMPARISON:  None Available.   FINDINGS: For the purposes of this dictation, the lowest well-formed intervertebral disc spaces presumed to be the L5-S1 level, and there presumed to be 5 lumbar type vertebral bodies.   The vertebral bodies are normally aligned with preservation of the normal lumbar lordosis. Vertebral body heights are well preserved. Signal intensity within the vertebral body bone marrow and spinal cord is normal. The conus medullaris terminates at the T12 level.   At L1-2, large central circumferential right paracentral bulging disc with a small right paracentral disc herniation component effacing the ventral anterior epidural space flattening the thecal sac and encroaching the right neural foramina   At L2-3, narrowing of the disc space with a central small disc herniation effacing the ventral anterior epidural space flattening the thecal sac without foraminal encroachment   At L3-4, narrowing of the disc space with a small central circumferential bulging disc flattening the thecal sac with mild bilateral foraminal encroachment   At L4-5, narrowing of the disc space with a central spondylitic disc herniation effacing the ventral anterior epidural space flattening of the thecal sac with mild bilateral bulging disc extending into the neural foramina with hypertrophic osteoarthritic changes of the facet joints left more than right   At L5-S1, no disc  herniation or foraminal narrowing.   No fractures.   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     Electronically Signed   By: Shaaron Adler M.D.   On: 04/30/2023 09:29     Objective:  VS:  HT:    WT:   BMI:     BP:123/75  HR:72bpm  TEMP: ( )  RESP:  Physical Exam Vitals and nursing note reviewed.  Constitutional:      General: She is not in acute distress.    Appearance: Normal appearance. She is not ill-appearing.  HENT:     Head: Normocephalic and atraumatic.     Right Ear: External ear normal.     Left Ear: External ear normal.  Eyes:     Extraocular Movements: Extraocular movements intact.  Cardiovascular:     Rate and Rhythm: Normal rate.     Pulses: Normal pulses.  Pulmonary:     Effort: Pulmonary effort is normal. No respiratory distress.  Abdominal:     General: There is no distension.     Palpations: Abdomen is soft.  Musculoskeletal:        General: Tenderness present.     Cervical back:  Neck supple.     Right lower leg: No edema.     Left lower leg: No edema.     Comments: Patient has good distal strength with no pain over the greater trochanters.  No clonus or focal weakness.  Skin:    Findings: No erythema, lesion or rash.  Neurological:     General: No focal deficit present.     Mental Status: She is alert and oriented to person, place, and time.     Sensory: No sensory deficit.     Motor: No weakness or abnormal muscle tone.     Coordination: Coordination normal.  Psychiatric:        Mood and Affect: Mood normal.        Behavior: Behavior normal.      Imaging: No results found.

## 2023-05-30 NOTE — Procedures (Signed)
 Lumbar Diagnostic Facet Joint Nerve Block with Fluoroscopic Guidance   Patient: Amanda [name]      Date of Birth: [birth year] MRN: [id number] PCP: Donato Schultz, DO      Visit Date: 05/27/2023   Universal Protocol:    Date/Time: 03/28/258:34 AM  Consent Given By: the patient  Position: PRONE  Additional Comments: Vital signs were monitored before and after the procedure. Patient was prepped and draped in the usual sterile fashion. The correct patient, procedure, and site was verified.   Injection Procedure Details:   Procedure diagnoses:  1. Facet arthropathy, lumbar      Meds Administered:  Meds ordered this encounter  Medications   bupivacaine (MARCAINE) 0.5 % (with pres) injection 3 mL     Laterality: Bilateral  Location/Site: L4-L5, L3 and L4 medial branches and L5-S1, L4 medial branch and L5 dorsal ramus  Needle: 5.0 in., 25 ga.  Short bevel or Quincke spinal needle  Needle Placement: Oblique pedical  Findings:   -Comments: There was excellent flow of contrast along the articular pillars without intravascular flow.  Procedure Details: The fluoroscope beam is vertically oriented in AP and then obliqued 15 to 20 degrees to the ipsilateral side of the desired nerve to achieve the "Scotty dog" appearance.  The skin over the target area of the junction of the superior articulating process and the transverse process (sacral ala if blocking the L5 dorsal rami) was locally anesthetized with a 1 ml volume of 1% Lidocaine without Epinephrine.  The spinal needle was inserted and advanced in a trajectory view down to the target.   After contact with periosteum and negative aspirate for blood and CSF, correct placement without intravascular or epidural spread was confirmed by injecting 0.5 ml. of Isovue-250.  A spot radiograph was obtained of this image.    Next, a 0.5 ml. volume of the injectate described above was injected. The needle was then redirected to the other  facet joint nerves mentioned above if needed.  Prior to the procedure, the patient was given a Pain Diary which was completed for baseline measurements.  After the procedure, the patient rated their pain every 30 minutes and will continue rating at this frequency for a total of 5 hours.  The patient has been asked to complete the Diary and return to [place] by mail, fax or hand delivered as soon as possible.   Additional Comments:  No complications occurred Dressing: 2 x 2 sterile gauze and Band-Aid    Post-procedure details: Patient was observed during the procedure. Post-procedure instructions were reviewed.  Patient left the clinic in stable condition.

## 2023-06-04 ENCOUNTER — Telehealth: Payer: Self-pay

## 2023-06-04 DIAGNOSIS — K080 Exfoliation of teeth due to systemic causes: Secondary | ICD-10-CM | POA: Diagnosis not present

## 2023-06-04 NOTE — Telephone Encounter (Signed)
 [name]

## 2023-06-05 DIAGNOSIS — M542 Cervicalgia: Secondary | ICD-10-CM | POA: Diagnosis not present

## 2023-06-05 DIAGNOSIS — M545 Low back pain, unspecified: Secondary | ICD-10-CM | POA: Diagnosis not present

## 2023-06-06 DIAGNOSIS — M545 Low back pain, unspecified: Secondary | ICD-10-CM | POA: Diagnosis not present

## 2023-06-06 DIAGNOSIS — M542 Cervicalgia: Secondary | ICD-10-CM | POA: Diagnosis not present

## 2023-06-10 DIAGNOSIS — M545 Low back pain, unspecified: Secondary | ICD-10-CM | POA: Diagnosis not present

## 2023-06-10 DIAGNOSIS — M542 Cervicalgia: Secondary | ICD-10-CM | POA: Diagnosis not present

## 2023-06-12 DIAGNOSIS — M542 Cervicalgia: Secondary | ICD-10-CM | POA: Diagnosis not present

## 2023-06-12 DIAGNOSIS — M545 Low back pain, unspecified: Secondary | ICD-10-CM | POA: Diagnosis not present

## 2023-06-13 ENCOUNTER — Other Ambulatory Visit: Payer: Self-pay | Admitting: Physical Medicine and Rehabilitation

## 2023-06-13 DIAGNOSIS — M47816 Spondylosis without myelopathy or radiculopathy, lumbar region: Secondary | ICD-10-CM

## 2023-06-13 DIAGNOSIS — M47819 Spondylosis without myelopathy or radiculopathy, site unspecified: Secondary | ICD-10-CM

## 2023-06-13 DIAGNOSIS — M545 Low back pain, unspecified: Secondary | ICD-10-CM

## 2023-06-19 DIAGNOSIS — M545 Low back pain, unspecified: Secondary | ICD-10-CM | POA: Diagnosis not present

## 2023-06-19 DIAGNOSIS — M542 Cervicalgia: Secondary | ICD-10-CM | POA: Diagnosis not present

## 2023-06-23 ENCOUNTER — Other Ambulatory Visit: Payer: Self-pay

## 2023-06-23 ENCOUNTER — Ambulatory Visit: Admitting: Physical Medicine and Rehabilitation

## 2023-06-23 VITALS — BP 153/84 | HR 61

## 2023-06-23 DIAGNOSIS — M47816 Spondylosis without myelopathy or radiculopathy, lumbar region: Secondary | ICD-10-CM

## 2023-06-23 MED ORDER — BUPIVACAINE HCL 0.5 % IJ SOLN
3.0000 mL | Freq: Once | INTRAMUSCULAR | Status: AC
  Administered 2023-06-23: 3 mL

## 2023-06-23 NOTE — Progress Notes (Signed)
 Amanda [name] - 83 y.o. female MRN [id number]  Date of birth: Oct 03, 1940  Office Visit Note: Visit Date: 06/23/2023 PCP: Estill Hemming, DO Referred by: Estill Hemming, *  Subjective: Chief Complaint  Patient presents with   Lower Back - Pain   HPI:  Amanda [name] is a 83 y.o. female who comes in today for planned repeat Bilateral L4-5 and L5-S1 Lumbar facet/medial branch block with fluoroscopic guidance.  The patient has failed conservative care including home exercise, medications, time and activity modification.  This injection will be diagnostic and hopefully therapeutic.  Please see requesting physician notes for further details and justification.  Exam shows concordant low back pain with facet joint loading and extension. Patient received more than 80% pain relief from prior injection. This would be the second block in a diagnostic double block paradigm.     Referring:Megan Broadus Canes, FNP   ROS Otherwise per HPI.  Assessment & Plan: Visit Diagnoses:    ICD-10-CM   1. 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   Meds & Orders:  Meds ordered this encounter  Medications   bupivacaine  (MARCAINE ) 0.5 % (with pres) injection 3 mL    Orders Placed This Encounter  Procedures   Facet Injection   XR C-ARM NO REPORT    Follow-up: Return for Review Pain Diary.   Procedures: No procedures performed  Lumbar Diagnostic Facet Joint Nerve Block with Fluoroscopic Guidance   Patient: Amanda [name]      Date of Birth: [birth year] MRN: [id number] PCP: Estill Hemming, DO      Visit Date: 06/23/2023   Universal Protocol:    Date/Time: 06/22/2508:57 AM  Consent Given By: the patient  Position: PRONE  Additional Comments: Vital signs were monitored before and after the procedure. Patient was  prepped and draped in the usual sterile fashion. The correct patient, procedure, and site was verified.   Injection Procedure Details:   Procedure diagnoses:  1. Spondylosis without myelopathy or radiculopathy, lumbar region      Meds Administered:  Meds ordered this encounter  Medications   bupivacaine  (MARCAINE ) 0.5 % (with pres) injection 3 mL     Laterality: Bilateral  Location/Site: L4-L5, L3 and L4 medial branches and L5-S1, L4 medial branch and L5 dorsal ramus  Needle: 5.0 in., 25 ga.  Short bevel or Quincke spinal needle  Needle Placement: Oblique pedical  Findings:   -Comments: There was excellent flow of contrast along the articular pillars without intravascular flow.  Procedure Details: The fluoroscope beam is vertically oriented in AP and then obliqued 15 to 20 degrees to the ipsilateral side of the desired nerve to achieve the "Scotty dog" appearance.  The skin over the target area of the junction of the superior articulating process and the transverse process (sacral ala if blocking the L5 dorsal rami) was locally anesthetized with a 1 ml volume of 1% Lidocaine  without Epinephrine .  The spinal needle was inserted and advanced in a trajectory view down to the target.   After contact with periosteum and negative aspirate for blood and CSF, correct placement without intravascular or epidural spread was confirmed by injecting 0.5 ml. of Isovue -250.  A spot radiograph was obtained of this image.    Next, a 0.5 ml. volume of the injectate  described above was injected. The needle was then redirected to the other facet joint nerves mentioned above if needed.  Prior to the procedure, the patient was given a Pain Diary which was completed for baseline measurements.  After the procedure, the patient rated their pain every 30 minutes and will continue rating at this frequency for a total of 5 hours.  The patient has been asked to complete the Diary and return to us  by mail, fax  or hand delivered as soon as possible.   Additional Comments:  The patient tolerated the procedure well Dressing: 2 x 2 sterile gauze and Band-Aid    Post-procedure details: Patient was observed during the procedure. Post-procedure instructions were reviewed.  Patient left the clinic in stable condition.   Clinical History: CLINICAL DATA:  Low back pain for 6 weeks despite treatment   For the purposes of this dictation, the lowest well-formed intervertebral disc spaces presumed to be the L5-S1 level, and there presumed to be 5 lumbar type vertebral bodies.   EXAM: MRI LUMBAR SPINE WITHOUT CONTRAST   TECHNIQUE: Multiplanar, multisequence MR imaging of the lumbar spine was performed. No intravenous contrast was administered.   COMPARISON:  None Available.   FINDINGS: For the purposes of this dictation, the lowest well-formed intervertebral disc spaces presumed to be the L5-S1 level, and there presumed to be 5 lumbar type vertebral bodies.   The vertebral bodies are normally aligned with preservation of the normal lumbar lordosis. Vertebral body heights are well preserved. Signal intensity within the vertebral body bone marrow and spinal cord is normal. The conus medullaris terminates at the T12 level.   At L1-2, large central circumferential right paracentral bulging disc with a small right paracentral disc herniation component effacing the ventral anterior epidural space flattening the thecal sac and encroaching the right neural foramina   At L2-3, narrowing of the disc space with a central small disc herniation effacing the ventral anterior epidural space flattening the thecal sac without foraminal encroachment   At L3-4, narrowing of the disc space with a small central circumferential bulging disc flattening the thecal sac with mild bilateral foraminal encroachment   At L4-5, narrowing of the disc space with a central spondylitic disc herniation effacing the  ventral anterior epidural space flattening of the thecal sac with mild bilateral bulging disc extending into the neural foramina with hypertrophic osteoarthritic changes of the facet joints left more than right   At L5-S1, no disc herniation or foraminal narrowing.   No fractures.   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     Electronically Signed   By: Fredrich Jefferson M.D.   On: 04/30/2023 09:29     Objective:  VS:  HT:    WT:   BMI:     BP:(!) 153/84  HR:61bpm  TEMP: ( )  RESP:  Physical Exam Vitals and nursing note reviewed.  Constitutional:      General: She is not in acute distress.    Appearance: Normal appearance. She is not ill-appearing.  HENT:     Head: Normocephalic and atraumatic.     Right Ear: External ear normal.     Left Ear: External ear normal.  Eyes:     Extraocular Movements: Extraocular movements intact.  Cardiovascular:     Rate and Rhythm: Normal rate.     Pulses: Normal pulses.  Pulmonary:     Effort: Pulmonary effort is normal. No respiratory distress.  Abdominal:     General: There is no distension.     Palpations: Abdomen is soft.  Musculoskeletal:        General: Tenderness present.     Cervical back: Neck supple.     Right lower leg: No edema.     Left lower leg: No edema.     Comments: Patient has good distal strength with no pain over the greater trochanters.  No clonus or focal weakness.  Skin:    Findings: No erythema, lesion or rash.  Neurological:     General: No focal deficit present.     Mental Status: She is alert and oriented to person, place, and time.     Sensory: No sensory deficit.     Motor: No weakness or abnormal muscle tone.     Coordination: Coordination normal.  Psychiatric:        Mood and Affect: Mood normal.        Behavior: Behavior normal.      Imaging: XR C-ARM NO REPORT Result Date: 06/23/2023 Please see Notes tab for  imaging impression.

## 2023-06-23 NOTE — Progress Notes (Signed)
 Pain Scale   Average Pain 4 Patient advising she has more pain while standing and walking, sitting does decrease her back pain.        +Driver, -BT, -Dye Allergies.

## 2023-06-23 NOTE — Procedures (Signed)
 Lumbar Diagnostic Facet Joint Nerve Block with Fluoroscopic Guidance   Patient: Amanda [name]      Date of Birth: October 04, 1940 MRN: [id number] PCP: Estill Hemming, DO      Visit Date: 06/23/2023   Universal Protocol:    Date/Time: 06/22/2508:57 AM  Consent Given By: the patient  Position: PRONE  Additional Comments: Vital signs were monitored before and after the procedure. Patient was prepped and draped in the usual sterile fashion. The correct patient, procedure, and site was verified.   Injection Procedure Details:   Procedure diagnoses:  1. Spondylosis without myelopathy or radiculopathy, lumbar region      Meds Administered:  Meds ordered this encounter  Medications   bupivacaine  (MARCAINE ) 0.5 % (with pres) injection 3 mL     Laterality: Bilateral  Location/Site: L4-L5, L3 and L4 medial branches and L5-S1, L4 medial branch and L5 dorsal ramus  Needle: 5.0 in., 25 ga.  Short bevel or Quincke spinal needle  Needle Placement: Oblique pedical  Findings:   -Comments: There was excellent flow of contrast along the articular pillars without intravascular flow.  Procedure Details: The fluoroscope beam is vertically oriented in AP and then obliqued 15 to 20 degrees to the ipsilateral side of the desired nerve to achieve the "Scotty dog" appearance.  The skin over the target area of the junction of the superior articulating process and the transverse process (sacral ala if blocking the L5 dorsal rami) was locally anesthetized with a 1 ml volume of 1% Lidocaine  without Epinephrine .  The spinal needle was inserted and advanced in a trajectory view down to the target.   After contact with periosteum and negative aspirate for blood and CSF, correct placement without intravascular or epidural spread was confirmed by injecting 0.5 ml. of Isovue -250.  A spot radiograph was obtained of this image.    Next, a 0.5 ml. volume of the injectate described above was injected. The  needle was then redirected to the other facet joint nerves mentioned above if needed.  Prior to the procedure, the patient was given a Pain Diary which was completed for baseline measurements.  After the procedure, the patient rated their pain every 30 minutes and will continue rating at this frequency for a total of 5 hours.  The patient has been asked to complete the Diary and return to us  by mail, fax or hand delivered as soon as possible.   Additional Comments:  The patient tolerated the procedure well Dressing: 2 x 2 sterile gauze and Band-Aid    Post-procedure details: Patient was observed during the procedure. Post-procedure instructions were reviewed.  Patient left the clinic in stable condition.

## 2023-06-23 NOTE — Patient Instructions (Signed)

## 2023-06-24 ENCOUNTER — Telehealth: Payer: Self-pay

## 2023-06-24 DIAGNOSIS — M542 Cervicalgia: Secondary | ICD-10-CM | POA: Diagnosis not present

## 2023-06-24 DIAGNOSIS — M545 Low back pain, unspecified: Secondary | ICD-10-CM | POA: Diagnosis not present

## 2023-06-24 NOTE — Telephone Encounter (Signed)
Left message to return call for preop

## 2023-06-25 ENCOUNTER — Other Ambulatory Visit: Payer: Self-pay | Admitting: Physical Medicine and Rehabilitation

## 2023-06-25 DIAGNOSIS — M47816 Spondylosis without myelopathy or radiculopathy, lumbar region: Secondary | ICD-10-CM

## 2023-06-25 DIAGNOSIS — M545 Low back pain, unspecified: Secondary | ICD-10-CM

## 2023-06-25 NOTE — Progress Notes (Unsigned)
 Bulkamid Injection  CC: 83 y.o. y.o. F with stress dominant mixed urinary incontinence who presents for transurethral Bulkamid injection.  Patient signed her consent form.  She started antibiotic prophylaxis today. Patient desired additional discussion regarding treatment options.   Patient's husband was brought into the room to review treatment options. Patient desires to proceed after discussion.  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   Lumbar burst fracture (HCC)    Lymphoproliferative disorder (HCC)    Macular degeneration 2013   Both eyes    Macular degeneration, bilateral    Marginal zone lymphoma (HCC)    Osteopenia    Pneumonia    Pneumonia due to COVID-19 virus 2021   Required hospitalization   PONV (postoperative nausea and vomiting)    needs little anesthesia   Shingles    Shortness of breath    on exertion   Spleen enlarged    SUI (stress urinary incontinence, female)    Urinary, incontinence, stress female    Wears glasses      Today's Vitals   06/26/23 1036  BP: 132/88  Pulse: 87    Results for orders placed or performed in visit on 06/26/23 (from the past 24 hours)  POCT Urinalysis Dipstick     Status: Abnormal   Collection Time: 06/26/23 10:53 AM  Result Value Ref Range   Color, UA Yellow    Clarity, UA Clear    Glucose, UA Negative Negative   Bilirubin, UA Small    Ketones, UA Trace    Spec Grav, UA 1.025 1.010 - 1.025   Blood, UA Negative    pH, UA 5.5 5.0 - 8.0   Protein, UA Negative Negative   Urobilinogen, UA 0.2 0.2 or 1.0 E.U./dL    Nitrite, UA Negative    Leukocytes, UA Small (1+) (A) Negative   Appearance     Odor     *Note: Due to a large number of results and/or encounters for the requested time period, some results have not been displayed. A complete set of results can be found in Results Review.    Procedure: Time out was performed. The bladder was catheterized and 10 ml of 2% lidocaine  jelly placed in the urethra. A urethral block was performed by injecting 4ml of 1% lidocaine  with epinephrine  at 3 and 9 o'clock adjacent to the urethra.  Patient developed 8/10 chest pain with left shoulder numbness radiating to her left arm. Denies metallic taste in mouth. No loss of consciousness.   HR elevated to 107, O2 97%, regular rate by palpation and auscultation at bedside. Symptoms improved after water  intake requested by patient with reduction of pain to 6/10 and HR to 90s. EMS was called and evaluated pt at bedside with EKG.  [place] called for care coordination, report given to Dr. Monnie Anthony.  Transferred by ambulance Urethral bulking procedure postponed.   ASSESSMENT: 83 y.o. y.o. who presented for transurethral Bulkamid injection for stress incontinence, procedure terminated due to chest pain.  PLAN: Patient will follow up in 4 weeks to reassess.  Encouraged to start Gemtesa  for urinary symptoms if negative cardiac workup.   All questions were answered.  Time spent: I spent 37 minutes dedicated to the care of this patient on the date of this encounter to include pre-visit review of records, face-to-face time with the patient discussing mixed urinary incontinence, and post visit documentation.    Darlene Ehlers, MD

## 2023-06-25 NOTE — Progress Notes (Signed)
 Greater than 80% relief with second diagnostic medial branch blocks bilaterally at L4-L5 and L5-S1 per pain diary. I placed order for radiofrequency ablation.

## 2023-06-26 ENCOUNTER — Ambulatory Visit: Payer: Medicare Other | Admitting: Obstetrics

## 2023-06-26 ENCOUNTER — Other Ambulatory Visit: Payer: Self-pay

## 2023-06-26 ENCOUNTER — Encounter: Payer: Self-pay | Admitting: Obstetrics

## 2023-06-26 ENCOUNTER — Emergency Department (HOSPITAL_COMMUNITY)

## 2023-06-26 ENCOUNTER — Inpatient Hospital Stay (HOSPITAL_COMMUNITY)
Admission: EM | Admit: 2023-06-26 | Discharge: 2023-06-27 | DRG: 287 | Disposition: A | Discharge status: Home / Self Care | Source: Ambulatory Visit | Attending: Internal Medicine | Admitting: Internal Medicine

## 2023-06-26 VITALS — BP 132/88 | HR 87

## 2023-06-26 DIAGNOSIS — Z8616 Personal history of COVID-19: Secondary | ICD-10-CM

## 2023-06-26 DIAGNOSIS — Z7982 Long term (current) use of aspirin: Secondary | ICD-10-CM | POA: Diagnosis not present

## 2023-06-26 DIAGNOSIS — I25111 Atherosclerotic heart disease of native coronary artery with angina pectoris with documented spasm: Secondary | ICD-10-CM | POA: Diagnosis not present

## 2023-06-26 DIAGNOSIS — R079 Chest pain, unspecified: Secondary | ICD-10-CM | POA: Diagnosis not present

## 2023-06-26 DIAGNOSIS — Z8572 Personal history of non-Hodgkin lymphomas: Secondary | ICD-10-CM | POA: Diagnosis not present

## 2023-06-26 DIAGNOSIS — K219 Gastro-esophageal reflux disease without esophagitis: Secondary | ICD-10-CM | POA: Diagnosis present

## 2023-06-26 DIAGNOSIS — I214 Non-ST elevation (NSTEMI) myocardial infarction: Principal | ICD-10-CM

## 2023-06-26 DIAGNOSIS — I2489 Other forms of acute ischemic heart disease: Secondary | ICD-10-CM | POA: Diagnosis not present

## 2023-06-26 DIAGNOSIS — Z8701 Personal history of pneumonia (recurrent): Secondary | ICD-10-CM

## 2023-06-26 DIAGNOSIS — I10 Essential (primary) hypertension: Secondary | ICD-10-CM | POA: Diagnosis not present

## 2023-06-26 DIAGNOSIS — J449 Chronic obstructive pulmonary disease, unspecified: Secondary | ICD-10-CM | POA: Diagnosis not present

## 2023-06-26 DIAGNOSIS — I444 Left anterior fascicular block: Secondary | ICD-10-CM | POA: Diagnosis not present

## 2023-06-26 DIAGNOSIS — R7989 Other specified abnormal findings of blood chemistry: Secondary | ICD-10-CM

## 2023-06-26 DIAGNOSIS — Z79899 Other long term (current) drug therapy: Secondary | ICD-10-CM

## 2023-06-26 DIAGNOSIS — N3946 Mixed incontinence: Secondary | ICD-10-CM

## 2023-06-26 DIAGNOSIS — E039 Hypothyroidism, unspecified: Secondary | ICD-10-CM | POA: Diagnosis present

## 2023-06-26 DIAGNOSIS — N393 Stress incontinence (female) (male): Secondary | ICD-10-CM | POA: Diagnosis not present

## 2023-06-26 DIAGNOSIS — Z8249 Family history of ischemic heart disease and other diseases of the circulatory system: Secondary | ICD-10-CM

## 2023-06-26 DIAGNOSIS — R42 Dizziness and giddiness: Secondary | ICD-10-CM | POA: Diagnosis not present

## 2023-06-26 DIAGNOSIS — Z823 Family history of stroke: Secondary | ICD-10-CM | POA: Diagnosis not present

## 2023-06-26 DIAGNOSIS — E785 Hyperlipidemia, unspecified: Secondary | ICD-10-CM | POA: Diagnosis not present

## 2023-06-26 DIAGNOSIS — Z79890 Hormone replacement therapy (postmenopausal): Secondary | ICD-10-CM

## 2023-06-26 LAB — POCT URINALYSIS DIPSTICK
Blood, UA: NEGATIVE
Glucose, UA: NEGATIVE
Nitrite, UA: NEGATIVE
Protein, UA: NEGATIVE
Spec Grav, UA: 1.025 (ref 1.010–1.025)
Urobilinogen, UA: 0.2 U/dL
pH, UA: 5.5 (ref 5.0–8.0)

## 2023-06-26 LAB — CBC
HCT: 44.2 % (ref 36.0–46.0)
Hemoglobin: 14.4 g/dL (ref 12.0–15.0)
MCH: 28 pg (ref 26.0–34.0)
MCHC: 32.6 g/dL (ref 30.0–36.0)
MCV: 86 fL (ref 80.0–100.0)
Platelets: 262 10*3/uL (ref 150–400)
RBC: 5.14 MIL/uL — ABNORMAL HIGH (ref 3.87–5.11)
RDW: 13.9 % (ref 11.5–15.5)
WBC: 8.2 10*3/uL (ref 4.0–10.5)
nRBC: 0 % (ref 0.0–0.2)

## 2023-06-26 LAB — TROPONIN I (HIGH SENSITIVITY)
Troponin I (High Sensitivity): 2467 ng/L (ref <18)
Troponin I (High Sensitivity): 4147 ng/L (ref <18)
Troponin I (High Sensitivity): 1719 ng/L (ref <18)

## 2023-06-26 LAB — BASIC METABOLIC PANEL WITH GFR
Anion gap: 14 (ref 5–15)
BUN: 24 mg/dL — ABNORMAL HIGH (ref 8–23)
CO2: 20 mmol/L — ABNORMAL LOW (ref 22–32)
Calcium: 9.2 mg/dL (ref 8.9–10.3)
Chloride: 104 mmol/L (ref 98–111)
Creatinine, Ser: 1.01 mg/dL — ABNORMAL HIGH (ref 0.44–1.00)
GFR, Estimated: 55 mL/min — ABNORMAL LOW (ref >60)
Glucose, Bld: 104 mg/dL — ABNORMAL HIGH (ref 70–99)
Potassium: 4.3 mmol/L (ref 3.5–5.1)
Sodium: 138 mmol/L (ref 135–145)

## 2023-06-26 MED ORDER — LIDOCAINE-EPINEPHRINE 1 %-1:100000 IJ SOLN
10.0000 mL | Freq: Once | INTRAMUSCULAR | Status: DC
Start: 2023-06-26 — End: 2023-06-26
  Administered 2023-06-26: 10 mL

## 2023-06-26 MED ORDER — HEPARIN BOLUS VIA INFUSION
3500.0000 [IU] | Freq: Once | INTRAVENOUS | Status: AC
  Administered 2023-06-26: 3500 [IU] via INTRAVENOUS
  Filled 2023-06-26: qty 3500

## 2023-06-26 MED ORDER — HEPARIN (PORCINE) 25000 UT/250ML-% IV SOLN
1000.0000 [IU]/h | INTRAVENOUS | Status: DC
  Administered 2023-06-26: 800 [IU]/h via INTRAVENOUS
  Filled 2023-06-26: qty 250

## 2023-06-26 MED ORDER — HEPARIN SODIUM (PORCINE) 5000 UNIT/ML IJ SOLN: 60.0000 [IU]/kg | Freq: Once | INTRAMUSCULAR | Status: DC

## 2023-06-26 MED ORDER — HEPARIN (PORCINE) 25000 UT/250ML-% IV SOLN: 12.0000 [IU]/kg/h | INTRAVENOUS | Status: DC

## 2023-06-26 NOTE — Progress Notes (Signed)
 PHARMACY - ANTICOAGULATION CONSULT NOTE  Pharmacy Consult for heparin  Indication: chest pain/ACS  Allergies  Allergen Reactions   Phenergan  [Promethazine  Hcl] Other (See Comments)    Jerking/agitation   Preservision Areds 2 [Multiple Vitamins-Minerals] Nausea Only   Gabapentin  Other (See Comments)   Levaquin  Eratosthenes.Fallen ] Nausea And Vomiting   Pravastatin Nausea And Vomiting    Patient Measurements: Height: 5' (152.4 cm) Weight: 65.8 kg (145 lb) IBW/kg (Calculated) : 45.5 HEPARIN  DW (KG): 59.5  Vital Signs: Temp: 97.9 F (36.6 C) (04/24 1555) Temp Source: Oral (04/24 1555) BP: 132/76 (04/24 1630) Pulse Rate: 57 (04/24 1630)  Labs: Recent Labs    06/26/23 1344  HGB 14.4  HCT 44.2  PLT 262  CREATININE 1.01*  TROPONINIHS 2,467*    Estimated Creatinine Clearance: 35.1 mL/min (A) (by C-G formula based on SCr of 1.01 mg/dL (H)).   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   Lumbar burst fracture (HCC)    Lymphoproliferative disorder (HCC)    Macular degeneration 2013   Both eyes    Macular degeneration, bilateral    Marginal zone lymphoma (HCC)    Osteopenia    Pneumonia    Pneumonia due to COVID-19 virus 2021   Required hospitalization   PONV (postoperative nausea and vomiting)    needs little anesthesia   Shingles    Shortness of breath    on exertion   Spleen enlarged    SUI (stress urinary incontinence, female)    Urinary, incontinence, stress female    Wears glasses      Assessment: 69 YOF presenting with CP, elevated troponin,  she is not on anticoagulation PTA  Goal of Therapy:  Heparin  level 0.3-0.7 units/ml Monitor platelets by anticoagulation protocol: Yes   Plan:  Heparin  3500 units IV x 1, and gtt at 800 units/hr F/u 8 hour heparin  level F/u cards eval and recs  Trinidad Funk, PharmD, [facility] Clinical Pharmacist ED Pharmacist Phone # [phone] 06/26/2023 4:52 PM

## 2023-06-26 NOTE — H&P (Signed)
 Cardiology Admission History and Physical   Patient ID: Amanda [name] MRN: [id number]; DOB: 11/27/1940   Admission date: 06/26/2023  PCP:  Estill Hemming, DO    HeartCare Providers Cardiologist:  None        Chief Complaint:  chest pain  Patient Profile:   Amanda [name] is a 83 y.o. female with a hx of hypothyroidism, essential hypertension, hyperlipidemia, urinary incontinence, history of community-acquired pneumonia who is being seen 06/26/2023 for the evaluation of chest pain at the request of [facility].  History of Present Illness:   Ms. Amanda [name]  83 y.o. female with no prior cardiac history who arrived at the ER on 06/26/2023 for chest pain    On interview the patient reported having was having a procedure done at 49 AM with her urologist when she began experiencing chest pain.  The chest pain was described as being a pinching pressure.  She also had numbness and pain in her bilateral upper extremities.  She also had shortness of breath when she was experiencing the chest pain.  Has had more shortness of breath with exertion over the past few weeks.  She had an echo performed on 03/2023 for shortness of breath that showed an LVEF of 60 to 65%, no regional wall motion abnormalities. She was brought to the ER by EMS services and received an aspirin .  Denies having any current chest pain.  Denies nausea, vomiting, diaphoresis, lower extremity edema.  Is unable to identify anything that made the pain better or worse.  Denies tobacco use, cannabis use, illicit substance use.  Drinks about 1 or 2 glasses of wine every week.  Denies any family history of heart attack, or heart disease.  No close family members have had a stroke.  Potassium4.3 (04/24 1344) Creatinine, ser  1.01* (04/24 1344) PLT  262 (04/24 1344) HGB  14.4 (04/24 1344) WBC 8.2 (04/24 1344) Troponin I (High Sensitivity)2,467* (04/24 1344)   EKG showed sinus rhythm with a rate of 81 and left  anterior fascicular block. Chest x-ray showed no active cardiopulmonary disease and COPD changes.   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   Lumbar burst fracture (HCC)    Lymphoproliferative disorder (HCC)    Macular degeneration 2013   Both eyes    Macular degeneration, bilateral    Marginal zone lymphoma (HCC)    Osteopenia    Pneumonia    Pneumonia due to COVID-19 virus 2021   Required hospitalization   PONV (postoperative nausea and vomiting)    needs little anesthesia   Shingles    Shortness of breath    on exertion   Spleen enlarged    SUI (stress urinary incontinence, female)    Urinary, incontinence, stress female    Wears glasses     Past Surgical History:  Procedure Laterality Date   BREAST EXCISIONAL BIOPSY Left 1980   CARPAL TUNNEL RELEASE  1999   CATARACT EXTRACTION  2009, 2011  BOTH EYES   CATARACT EXTRACTION, BILATERAL     CESAREAN SECTION  1959   CESAREAN SECTION     COLONOSCOPY      Dr Dellis Fermo   DILATION AND CURETTAGE OF UTERUS     X2   EYE SURGERY  Cataracts removed   FRACTURE SURGERY  12/12/2018   Left ankle plates   HYSTEROSCOPY WITH D & C  01/07/2012   Procedure: DILATATION AND CURETTAGE /HYSTEROSCOPY;  Surgeon: Davia Erps, MD;  Location: [facility];  Service: Gynecology;  Laterality: N/A;  intrauterine foley catheter for tamponode    IR IMAGING GUIDED PORT INSERTION  07/15/2018   IR REMOVAL TUN ACCESS W/ PORT W/O FL MOD SED  04/11/2021   LYMPH NODE BIOPSY Left 05/26/2018   Procedure: LEFT AXILLARY LYMPH NODE BIOPSY;  Surgeon: Boyce Byes, MD;  Location: [facility] OR;  Service: General;  Laterality: Left;   ORIF ANKLE FRACTURE Left 12/12/2018   ORIF ANKLE FRACTURE Left 12/12/2018   Procedure: OPEN REDUCTION INTERNAL FIXATION (ORIF) ANKLE FRACTURE;  Surgeon: Jasmine Mesi, MD;  Location: [facility] OR;  Service: Orthopedics;  Laterality: Left;   ORIF ANKLE FRACTURE Left 12/2018   TONSILLECTOMY     TONSILLECTOMY AND ADENOIDECTOMY     TUBAL LIGATION     BY LAPAROSCOPY   WISDOM TOOTH EXTRACTION       Medications Prior to Admission: Prior to Admission medications   Medication Sig Start Date End Date Taking? Authorizing Provider  acetaminophen  (TYLENOL ) 325 MG tablet Take 1-2 tablets (325-650 mg total) by mouth every 8 (eight) hours as needed for mild pain. 04/19/20   Love, Renay Carota, PA-C  albuterol  (VENTOLIN  HFA) 108 (90 Base) MCG/ACT inhaler INHALE 2 PUFFS INTO THE LUNGS EVERY 6 HOURS AS NEEDED FOR WHEEZING OR SHORTNESS OF BREATH 01/24/21   Estill Hemming, DO  amLODipine  (NORVASC ) 10 MG tablet Take 1 tablet (10 mg total) by mouth daily. 05/15/23   Lowne Chase, Yvonne R, DO  aspirin  81 MG chewable tablet Chew 1 tablet (81 mg total) by mouth daily. 12/25/18   Angiulli, Everlyn Hockey, PA-C  buPROPion  (WELLBUTRIN  XL) 150 MG 24 hr tablet Take 1 tablet (150 mg total) by mouth daily. 03/21/23   Lowne Chase, Yvonne R, DO  Calcium -Cholecalciferol (QC CALCIUM  500MG -D3) 500-5 MG-MCG TABS Take 500 mg by mouth daily.    [provider]  Cholecalciferol (VITAMIN D3 ADULT GUMMIES) 25 MCG (1000 UT) CHEW Chew 2 each by mouth daily.    [provider]  guaiFENesin  (MUCINEX ) 600 MG 12 hr tablet Take 600 mg by mouth daily.    [provider]  ibuprofen (ADVIL) 200 MG tablet Take 200 mg by mouth daily as needed.    [provider]  levothyroxine  (SYNTHROID ) 125 MCG tablet TAKE 1 TABLET(125 MCG) BY MOUTH DAILY BEFORE BREAKFAST 07/18/22   Crecencio Dodge, Yvonne R, DO  pantoprazole  (PROTONIX ) 40 MG tablet TAKE 1 TABLET(40  MG) BY MOUTH DAILY 04/17/23   Crecencio Dodge, Adel Holt R, DO  rosuvastatin  (CRESTOR ) 5 MG tablet TAKE 1 TABLET BY MOUTH ON MONDAY, WEDNESDAY AND FRIDAY 12/12/22   Crecencio Dodge, Yvonne R, DO  senna-docusate (SENOKOT-S) 8.6-50 MG tablet Take 2 tablets by mouth at bedtime. 04/19/20   Love, Renay Carota, PA-C  sulfamethoxazole -trimethoprim  (BACTRIM  DS) 800-160 MG tablet Take 1 tablet by mouth 2 (two) times daily. 04/15/23   Darlene Ehlers, MD     Allergies:    Allergies  Allergen Reactions   Phenergan  [Promethazine  Hcl]  Other (See Comments)    Jerking/agitation   Preservision Areds 2 [Multiple Vitamins-Minerals] Nausea Only   Gabapentin  Other (See Comments)   Levaquin  [Levofloxacin ] Nausea And Vomiting   Pravastatin Nausea And Vomiting    Social History:   Social History   Socioeconomic History   Marital status: Married    Spouse name: Not on file   Number of children: Not on file   Years of education: Not on file   Highest education level: Not on file  Occupational History   Not on file  Tobacco Use   Smoking status: Never   Smokeless tobacco: Never  Vaping Use   Vaping status: Never Used  Substance and Sexual Activity   Alcohol use: Yes    Alcohol/week: 1.0 standard drink of alcohol    Types: 1 Glasses of wine per week    Comment: RARE   Drug use: Never   Sexual activity: Not Currently    Birth control/protection: Post-menopausal  Other Topics Concern   Not on file  Social History Narrative   ** Merged History Encounter **       ** Merged History Encounter **       Social Drivers of Corporate investment banker Strain: Low Risk  (07/16/2022)   Overall Financial Resource Strain (CARDIA)    Difficulty of Paying Living Expenses: Not hard at all  Food Insecurity: No Food Insecurity (07/16/2022)   Hunger Vital Sign    Worried About Running Out of Food in the Last Year: Never true    Ran Out of Food in the Last Year: Never true  Transportation Needs: No Transportation Needs (07/16/2022)    PRAPARE - Administrator, Civil Service (Medical): No    Lack of Transportation (Non-Medical): No  Physical Activity: Insufficiently Active (11/18/2022)   Exercise Vital Sign    Days of Exercise per Week: 3 days    Minutes of Exercise per Session: 20 min  Stress: No Stress Concern Present (07/16/2022)   [employer] of Occupational Health - Occupational Stress Questionnaire    Feeling of Stress : Not at all  Social Connections: Unknown (07/16/2022)   Social Connection and Isolation Panel [NHANES]    Frequency of Communication with Friends and Family: More than three times a week    Frequency of Social Gatherings with Friends and Family: Twice a week    Attends Religious Services: Not on Insurance claims handler of Clubs or Organizations: Yes    Attends Banker Meetings: More than 4 times per year    Marital Status: Married  Catering manager Violence: Not At Risk (07/16/2022)   Humiliation, Afraid, Rape, and Kick questionnaire    Fear of Current or Ex-Partner: No    Emotionally Abused: No    Physically Abused: No    Sexually Abused: No    Family History:   The patient's family history includes Breast cancer (age of onset: 26) in her mother; COPD in her paternal grandfather; Diabetes in her father; Hyperlipidemia in her brother; Hypertension in her father; Kidney disease in her father; Kidney failure in her father; Ovarian cancer in her mother; Prostate cancer in her father; Stroke in her maternal grandfather. There is no history of Hypercalcemia.    ROS:  Please see the history of present illness.  All other ROS reviewed and negative.     Physical Exam/Data:   Vitals:   06/26/23 1212 06/26/23 1227 06/26/23 1555 06/26/23 1630  BP: 129/71  Aaron Aas)  163/75 132/76  Pulse: 81  67 (!) 57  Resp: 15  19 (!) 21  Temp: 98.1 F (36.7 C)  97.9 F (36.6 C)   TempSrc: Oral  Oral   SpO2: 95%  97% 94%  Weight:  65.8 kg    Height:  5' (1.524 m)     No intake or output  data in the 24 hours ending 06/26/23 1824    06/26/2023   12:27 PM 04/07/2023    8:34 AM 03/21/2023   11:41 AM  Last 3 Weights  Weight (lbs) 145 lb 147 lb 3.2 oz 150 lb  Weight (kg) 65.772 kg 66.769 kg 68.04 kg     Body mass index is 28.32 kg/m.  General:  Well nourished, well developed, in no acute distress HEENT: normal Neck: no JVD Vascular: No carotid bruits; Distal pulses 2+ bilaterally   Cardiac:  normal S1, S2; RRR; no murmur  Lungs:  clear to auscultation bilaterally, no wheezing, rhonchi or rales  Abd: soft, nontender, no hepatomegaly  Ext: no edema Musculoskeletal:  No deformities Skin: warm and dry  Neuro:   no focal abnormalities noted Psych:  Normal affect    EKG:  The ECG that was done on 06/26/23 was personally reviewed and demonstrates:  sinus rhythm with a rate of 81 and left anterior fascicular block.  Relevant CV Studies: Echo on 03/2023 at [facility] IMPRESSIONS     1. Left ventricular ejection fraction, by estimation, is 60 to 65%. The  left ventricle has normal function. The left ventricle has no regional  wall motion abnormalities. Left ventricular diastolic parameters are  consistent with Grade I diastolic  dysfunction (impaired relaxation).   2. Right ventricular systolic function is normal. The right ventricular  size is normal.   3. The mitral valve is grossly normal. No evidence of mitral valve  regurgitation. No evidence of mitral stenosis.   4. The aortic valve is tricuspid. Aortic valve regurgitation is not  visualized. No aortic stenosis is present.   5. There is mild dilatation of the ascending aorta, measuring 37 mm.   6. The inferior vena cava is normal in size with greater than 50%  respiratory variability, suggesting right atrial pressure of 3 mmHg.   Comparison(s): Echocardiogram done 02/07/21 showed an EF of 60-65%.   Laboratory Data:  High Sensitivity Troponin:   Recent Labs  Lab 06/26/23 1344 06/26/23 1521   TROPONINIHS 2,467* 4,147*      Chemistry Recent Labs  Lab 06/26/23 1344  NA 138  K 4.3  CL 104  CO2 20*  GLUCOSE 104*  BUN 24*  CREATININE 1.01*  CALCIUM  9.2  GFRNONAA 55*  ANIONGAP 14    No results for input(s): "PROT", "ALBUMIN ", "AST", "ALT", "ALKPHOS", "BILITOT" in the last 168 hours. Lipids No results for input(s): "CHOL", "TRIG", "HDL", "LABVLDL", "LDLCALC", "CHOLHDL" in the last 168 hours. Hematology Recent Labs  Lab 06/26/23 1344  WBC 8.2  RBC 5.14*  HGB 14.4  HCT 44.2  MCV 86.0  MCH 28.0  MCHC 32.6  RDW 13.9  PLT 262   Thyroid  No results for input(s): "TSH", "FREET4" in the last 168 hours. BNPNo results for input(s): "BNP", "PROBNP" in the last 168 hours.  DDimer No results for input(s): "DDIMER" in the last 168 hours.   Radiology/Studies:  DG Chest 2 View Result Date: 06/26/2023 CLINICAL DATA:  Chest pain EXAM: CHEST - 2 VIEW COMPARISON:  March 21, 2023 FINDINGS: The heart size and mediastinal contours are within  normal limits. Both lungs are clear. The visualized skeletal structures are unremarkable. IMPRESSION: No active cardiopulmonary disease.  COPD changes. Old fracture deformity of the right humeral head and neck Electronically Signed   By: Fredrich Jefferson M.D.   On: 06/26/2023 14:24     Assessment and Plan:   DONNITA FARINA is a 83 y.o. female with a hx of hypothyroidism, essential hypertension, hyperlipidemia, urinary incontinence, who is being seen 06/26/2023 for the evaluation of chest pain at the request of Veryl Gottron Tegeler  NSTEMI The patient was at her urologist and getting a procedure for urgent continence.  The procedure she developed a pinching pressure like chest pain.  The pain radiated into both arms.  She does not currently have chest pain The patient has no prior cardiac history EKG showed sinus rhythm with a rate of 81 and left anterior fascicular block. High-sensitivity troponins 2467 She received aspirin  en route to the ER --  Continue IV heparin . PTA on Crestor  5 mg on Monday Wednesday Friday -- Start Crestor  20 mg daily. -- Start Toprol  xl 25mg  daily -- Continue aspirin  81mg  daily -- Order echo  Hypertension PTA on amlodipine  -- Start Toprol  xl 25mg  daily -- Continue home amlodipine  10mg    Hyperlipidemia LDL on 3/24 was 82 Order LPA for tomorrow -- Start Crestor  20 mg daily.  Hypothyroidism Continue home Synthroid  125 by mouth daily.  History of pneumonia History of legionnaires disease -- Continue guaifenesin  600 mg daily  Risk Assessment/Risk Scores:    TIMI Risk Score for Unstable Angina or Non-ST Elevation MI:   The patient's TIMI risk score is 4, which indicates a 20% risk of all cause mortality, new or recurrent myocardial infarction or need for urgent revascularization in the next 14 days.      Code Status: Full Code  Severity of Illness: The appropriate patient status for this patient is INPATIENT. Inpatient status is judged to be reasonable and necessary in order to provide the required intensity of service to ensure the patient's safety. The patient's presenting symptoms, physical exam findings, and initial radiographic and laboratory data in the context of their chronic comorbidities is felt to place them at high risk for further clinical deterioration. Furthermore, it is not anticipated that the patient will be medically stable for discharge from the hospital within 2 midnights of admission.   * I certify that at the point of admission it is my clinical judgment that the patient will require inpatient hospital care spanning beyond 2 midnights from the point of admission due to high intensity of service, high risk for further deterioration and high frequency of surveillance required.*   For questions or updates, please contact [facility] Please consult [email or web address] for contact info under     Signed, Melita Springer, PA-C  06/26/2023 6:24 PM

## 2023-06-26 NOTE — Patient Instructions (Addendum)
 Amanda [name]

## 2023-06-26 NOTE — Consult Note (Deleted)
 Cardiology Admission History and Physical   Patient ID: Amanda [name] MRN: [id number]; DOB: 1940-06-01   Admission date: 06/26/2023  PCP:  Estill Hemming, DO    HeartCare Providers Cardiologist:  None        Chief Complaint:  chest pain  Patient Profile:   Amanda [name] is a 83 y.o. [name] with a hx of hypothyroidism, essential hypertension, hyperlipidemia, urinary incontinence, history of community-acquired pneumonia who is being seen 06/26/2023 for the evaluation of chest pain at the request of [facility].  History of Present Illness:   Amanda [name]  83 y.o. [name] with no prior cardiac history who arrived at the ER on 06/26/2023 for chest pain    On interview the patient reported having was having a procedure done at 19 AM with her urologist when she began experiencing chest pain.  The chest pain was described as being a pinching pressure.  She also had numbness and pain in her bilateral upper extremities.  She also had shortness of breath when she was experiencing the chest pain.  Has had more shortness of breath with exertion over the past few weeks.  She had an echo performed on 03/2023 for shortness of breath that showed an LVEF of 60 to 65%, no regional wall motion abnormalities. She was brought to the ER by EMS services and received an aspirin .  Denies having any current chest pain.  Denies nausea, vomiting, diaphoresis, lower extremity edema.  Is unable to identify anything that made the pain better or worse.  Denies tobacco use, cannabis use, illicit substance use.  Drinks about 1 or 2 glasses of wine every week.  Denies any family history of heart attack, or heart disease.  No close family members have had a stroke.  Potassium4.3 (04/24 1344) Creatinine, ser  1.01* (04/24 1344) PLT  262 (04/24 1344) HGB  14.4 (04/24 1344) WBC 8.2 (04/24 1344) Troponin I (High Sensitivity)2,467* (04/24 1344)   EKG showed sinus rhythm with a rate of 81 and left  anterior fascicular block. Chest x-ray showed no active cardiopulmonary disease and COPD changes.   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   Lumbar burst fracture (HCC)    Lymphoproliferative disorder (HCC)    Macular degeneration 2013   Both eyes    Macular degeneration, bilateral    Marginal zone lymphoma (HCC)    Osteopenia    Pneumonia    Pneumonia due to COVID-19 virus 2021   Required hospitalization   PONV (postoperative nausea and vomiting)    needs little anesthesia   Shingles    Shortness of breath    on exertion   Spleen enlarged    SUI (stress urinary incontinence, [name])    Urinary, incontinence, stress [name]    Wears glasses     Past Surgical History:  Procedure Laterality Date   BREAST EXCISIONAL BIOPSY Left 1980   CARPAL TUNNEL RELEASE  1999   CATARACT EXTRACTION  2009, 2011  BOTH EYES   CATARACT EXTRACTION, BILATERAL     CESAREAN SECTION  1959   CESAREAN SECTION     COLONOSCOPY      Dr Dellis Fermo   DILATION AND CURETTAGE OF UTERUS     X2   EYE SURGERY  Cataracts removed   FRACTURE SURGERY  12/12/2018   Left ankle plates   HYSTEROSCOPY WITH D & C  01/07/2012   Procedure: DILATATION AND CURETTAGE /HYSTEROSCOPY;  Surgeon: Davia Erps, MD;  Location: [facility];  Service: Gynecology;  Laterality: N/A;  intrauterine foley catheter for tamponode    IR IMAGING GUIDED PORT INSERTION  07/15/2018   IR REMOVAL TUN ACCESS W/ PORT W/O FL MOD SED  04/11/2021   LYMPH NODE BIOPSY Left 05/26/2018   Procedure: LEFT AXILLARY LYMPH NODE BIOPSY;  Surgeon: Boyce Byes, MD;  [facility];  Service: General;  Laterality: Left;   ORIF ANKLE FRACTURE Left 12/12/2018   ORIF ANKLE FRACTURE Left 12/12/2018   Procedure: OPEN REDUCTION INTERNAL FIXATION (ORIF) ANKLE FRACTURE;  Surgeon: Jasmine Mesi, MD;  Location: [facility] OR;  Service: Orthopedics;  Laterality: Left;   ORIF ANKLE FRACTURE Left 12/2018   TONSILLECTOMY     TONSILLECTOMY AND ADENOIDECTOMY     TUBAL LIGATION     BY LAPAROSCOPY   WISDOM TOOTH EXTRACTION       Medications Prior to Admission: Prior to Admission medications   Medication Sig Start Date End Date Taking? Authorizing Provider  acetaminophen  (TYLENOL ) 325 MG tablet Take 1-2 tablets (325-650 mg total) by mouth every 8 (eight) hours as needed for mild pain. 04/19/20   Love, Renay Carota, PA-C  albuterol  (VENTOLIN  HFA) 108 (90 Base) MCG/ACT inhaler INHALE 2 PUFFS INTO THE LUNGS EVERY 6 HOURS AS NEEDED FOR WHEEZING OR SHORTNESS OF BREATH 01/24/21   Roel Clarity R, DO  amLODipine  (NORVASC ) 10 MG tablet Take 1 tablet (10 mg total) by mouth daily. 05/15/23   Lowne Chase, Yvonne R, DO  aspirin  81 MG chewable tablet Chew 1 tablet (81 mg total) by mouth daily. 12/25/18   Angiulli, Everlyn Hockey, PA-C  buPROPion  (WELLBUTRIN  XL) 150 MG 24 hr tablet Take 1 tablet (150 mg total) by mouth daily. 03/21/23   Estill Hemming, DO  Calcium -Cholecalciferol (QC CALCIUM  500MG -D3) 500-5 MG-MCG TABS Take 500 mg by mouth daily.    [provider]  Cholecalciferol (VITAMIN D3 ADULT GUMMIES) 25 MCG (1000 UT) CHEW Chew 2 each by mouth daily.    [provider]  guaiFENesin  (MUCINEX ) 600 MG 12 hr tablet Take 600 mg by mouth daily.    [provider]  ibuprofen (ADVIL) 200 MG tablet Take 200 mg by mouth daily as needed.    [provider]  levothyroxine  (SYNTHROID ) 125 MCG tablet TAKE 1 TABLET(125 MCG) BY MOUTH DAILY BEFORE BREAKFAST 07/18/22   Crecencio Dodge, Yvonne R, DO  pantoprazole  (PROTONIX ) 40 MG tablet TAKE 1 TABLET(40  MG) BY MOUTH DAILY 04/17/23   Crecencio Dodge, Yvonne R, DO  rosuvastatin  (CRESTOR ) 5 MG tablet TAKE 1 TABLET BY MOUTH ON MONDAY, WEDNESDAY AND FRIDAY 12/12/22   Crecencio Dodge, Yvonne R, DO  senna-docusate (SENOKOT-S) 8.6-50 MG tablet Take 2 tablets by mouth at bedtime. 04/19/20   Love, Renay Carota, PA-C  sulfamethoxazole -trimethoprim  (BACTRIM  DS) 800-160 MG tablet Take 1 tablet by mouth 2 (two) times daily. 04/15/23   Darlene Ehlers, MD     Allergies:    Allergies  Allergen Reactions   Phenergan  [Promethazine  Hcl]  Other (See Comments)    Jerking/agitation   Preservision Areds 2 [Multiple Vitamins-Minerals] Nausea Only   Gabapentin  Other (See Comments)   Levaquin  [Levofloxacin ] Nausea And Vomiting   Pravastatin Nausea And Vomiting    Social History:   Social History   Socioeconomic History   Marital status: Married    Spouse name: Not on file   Number of children: Not on file   Years of education: Not on file   Highest education level: Not on file  Occupational History   Not on file  Tobacco Use   Smoking status: Never   Smokeless tobacco: Never  Vaping Use   Vaping status: Never Used  Substance and Sexual Activity   Alcohol use: Yes    Alcohol/week: 1.0 standard drink of alcohol    Types: 1 Glasses of wine per week    Comment: RARE   Drug use: Never   Sexual activity: Not Currently    Birth control/protection: Post-menopausal  Other Topics Concern   Not on file  Social History Narrative   ** Merged History Encounter **       ** Merged History Encounter **       Social Drivers of Corporate investment banker Strain: Low Risk  (07/16/2022)   Overall Financial Resource Strain (CARDIA)    Difficulty of Paying Living Expenses: Not hard at all  Food Insecurity: No Food Insecurity (07/16/2022)   Hunger Vital Sign    Worried About Running Out of Food in the Last Year: Never true    Ran Out of Food in the Last Year: Never true  Transportation Needs: No Transportation Needs (07/16/2022)    PRAPARE - Administrator, Civil Service (Medical): No    Lack of Transportation (Non-Medical): No  Physical Activity: Insufficiently Active (11/18/2022)   Exercise Vital Sign    Days of Exercise per Week: 3 days    Minutes of Exercise per Session: 20 min  Stress: No Stress Concern Present (07/16/2022)   [employer] of Occupational Health - Occupational Stress Questionnaire    Feeling of Stress : Not at all  Social Connections: Unknown (07/16/2022)   Social Connection and Isolation Panel [NHANES]    Frequency of Communication with Friends and Family: More than three times a week    Frequency of Social Gatherings with Friends and Family: Twice a week    Attends Religious Services: Not on Insurance claims handler of Clubs or Organizations: Yes    Attends Banker Meetings: More than 4 times per year    Marital Status: Married  Catering manager Violence: Not At Risk (07/16/2022)   Humiliation, Afraid, Rape, and Kick questionnaire    Fear of Current or Ex-Partner: No    Emotionally Abused: No    Physically Abused: No    Sexually Abused: No    Family History:   The patient's family history includes Breast cancer (age of onset: 26) in her mother; COPD in her paternal grandfather; Diabetes in her father; Hyperlipidemia in her brother; Hypertension in her father; Kidney disease in her father; Kidney failure in her father; Ovarian cancer in her mother; Prostate cancer in her father; Stroke in her maternal grandfather. There is no history of Hypercalcemia.    ROS:  Please see the history of present illness.  All other ROS reviewed and negative.     Physical Exam/Data:   Vitals:   06/26/23 1212 06/26/23 1227 06/26/23 1555 06/26/23 1630  BP: 129/71  Aaron Aas)  163/75 132/76  Pulse: 81  67 (!) 57  Resp: 15  19 (!) 21  Temp: 98.1 F (36.7 C)  97.9 F (36.6 C)   TempSrc: Oral  Oral   SpO2: 95%  97% 94%  Weight:  65.8 kg    Height:  5' (1.524 m)     No intake or output  data in the 24 hours ending 06/26/23 1824    06/26/2023   12:27 PM 04/07/2023    8:34 AM 03/21/2023   11:41 AM  Last 3 Weights  Weight (lbs) 145 lb 147 lb 3.2 oz 150 lb  Weight (kg) 65.772 kg 66.769 kg 68.04 kg     Body mass index is 28.32 kg/m.  General:  Well nourished, well developed, in no acute distress HEENT: normal Neck: no JVD Vascular: No carotid bruits; Distal pulses 2+ bilaterally   Cardiac:  normal S1, S2; RRR; no murmur  Lungs:  clear to auscultation bilaterally, no wheezing, rhonchi or rales  Abd: soft, nontender, no hepatomegaly  Ext: no edema Musculoskeletal:  No deformities Skin: warm and dry  Neuro:   no focal abnormalities noted Psych:  Normal affect    EKG:  The ECG that was done on 06/26/23 was personally reviewed and demonstrates:  sinus rhythm with a rate of 81 and left anterior fascicular block.  Relevant CV Studies: Echo on 03/2023 at [facility] IMPRESSIONS     1. Left ventricular ejection fraction, by estimation, is 60 to 65%. The  left ventricle has normal function. The left ventricle has no regional  wall motion abnormalities. Left ventricular diastolic parameters are  consistent with Grade I diastolic  dysfunction (impaired relaxation).   2. Right ventricular systolic function is normal. The right ventricular  size is normal.   3. The mitral valve is grossly normal. No evidence of mitral valve  regurgitation. No evidence of mitral stenosis.   4. The aortic valve is tricuspid. Aortic valve regurgitation is not  visualized. No aortic stenosis is present.   5. There is mild dilatation of the ascending aorta, measuring 37 mm.   6. The inferior vena cava is normal in size with greater than 50%  respiratory variability, suggesting right atrial pressure of 3 mmHg.   Comparison(s): Echocardiogram done 02/07/21 showed an EF of 60-65%.   Laboratory Data:  High Sensitivity Troponin:   Recent Labs  Lab 06/26/23 1344 06/26/23 1521   TROPONINIHS 2,467* 4,147*      Chemistry Recent Labs  Lab 06/26/23 1344  NA 138  K 4.3  CL 104  CO2 20*  GLUCOSE 104*  BUN 24*  CREATININE 1.01*  CALCIUM  9.2  GFRNONAA 55*  ANIONGAP 14    No results for input(s): "PROT", "ALBUMIN ", "AST", "ALT", "ALKPHOS", "BILITOT" in the last 168 hours. Lipids No results for input(s): "CHOL", "TRIG", "HDL", "LABVLDL", "LDLCALC", "CHOLHDL" in the last 168 hours. Hematology Recent Labs  Lab 06/26/23 1344  WBC 8.2  RBC 5.14*  HGB 14.4  HCT 44.2  MCV 86.0  MCH 28.0  MCHC 32.6  RDW 13.9  PLT 262   Thyroid  No results for input(s): "TSH", "FREET4" in the last 168 hours. BNPNo results for input(s): "BNP", "PROBNP" in the last 168 hours.  DDimer No results for input(s): "DDIMER" in the last 168 hours.   Radiology/Studies:  DG Chest 2 View Result Date: 06/26/2023 CLINICAL DATA:  Chest pain EXAM: CHEST - 2 VIEW COMPARISON:  March 21, 2023 FINDINGS: The heart size and mediastinal contours are within  normal limits. Both lungs are clear. The visualized skeletal structures are unremarkable. IMPRESSION: No active cardiopulmonary disease.  COPD changes. Old fracture deformity of the right humeral head and neck Electronically Signed   By: Fredrich Jefferson M.D.   On: 06/26/2023 14:24     Assessment and Plan:   Amanda [name] is a 83 y.o. [name] with a hx of hypothyroidism, essential hypertension, hyperlipidemia, urinary incontinence, who is being seen 06/26/2023 for the evaluation of chest pain at the request of [facility]eg[facility]  NSTEMI The patient was at her urologist and getting a procedure for urgent continence.  The procedure she developed a pinching pressure like chest pain.  The pain radiated into both arms.  She does not currently have chest pain The patient has no prior cardiac history EKG showed sinus rhythm with a rate of 81 and left anterior fascicular block. High-sensitivity troponins 2467 She received aspirin  en route to the ER --  Continue IV heparin . PTA on Crestor  5 mg on Monday Wednesday Friday -- Start Crestor  20 mg daily. -- Start Toprol  xl 25mg  daily -- Continue aspirin  81mg  daily -- Order echo  Hypertension PTA on amlodipine  -- Start Toprol  xl 25mg  daily -- Continue home amlodipine  10mg    Hyperlipidemia LDL on 3/24 was 82 Order LPA for tomorrow -- Start Crestor  20 mg daily.  Hypothyroidism Continue home Synthroid  125 by mouth daily.  History of pneumonia History of legionnaires disease -- Continue guaifenesin  600 mg daily  Risk Assessment/Risk Scores:    TIMI Risk Score for Unstable Angina or Non-ST Elevation MI:   The patient's TIMI risk score is 4, which indicates a 20% risk of all cause mortality, new or recurrent myocardial infarction or need for urgent revascularization in the next 14 days.      Code Status: Full Code  Severity of Illness: The appropriate patient status for this patient is INPATIENT. Inpatient status is judged to be reasonable and necessary in order to provide the required intensity of service to ensure the patient's safety. The patient's presenting symptoms, physical exam findings, and initial radiographic and laboratory data in the context of their chronic comorbidities is felt to place them at high risk for further clinical deterioration. Furthermore, it is not anticipated that the patient will be medically stable for discharge from the hospital within 2 midnights of admission.   * I certify that at the point of admission it is my clinical judgment that the patient will require inpatient hospital care spanning beyond 2 midnights from the point of admission due to high intensity of service, high risk for further deterioration and high frequency of surveillance required.*   For questions or updates, please contact [facility] Please consult [email or web address] for contact info under     Signed, Melita Springer, PA-C  06/26/2023 6:24 PM

## 2023-06-26 NOTE — ED Notes (Signed)
 Awaiting patient from lobby.

## 2023-06-26 NOTE — ED Provider Triage Note (Signed)
 Emergency Medicine Provider Triage Evaluation Note  Amanda [name] , a 83 y.o. female  was evaluated in triage.  Pt complains of chest pain..  Review of Systems  Positive: Chest pain Negative: Fevers tachycardia  Physical Exam  BP 129/71   Pulse 81   Temp 98.1 F (36.7 C) (Oral)   Resp 15   Ht 5' (1.524 m)   Wt 65.8 kg   SpO2 95%   BMI 28.32 kg/m  Awake and appropriate.  Normal rhythm.  Medical Decision Making  Medically screening exam initiated at 12:55 PM.  Appropriate orders placed.  Amanda [name] was informed that the remainder of the evaluation will be completed by another provider, this initial triage assessment does not replace that evaluation, and the importance of remaining in the ED until their evaluation is complete.  Patient with chest pain.  Began while getting injection with lidocaine  and epi.  Did not feel heart racing but developed chest tightness.  Feeling somewhat better still having pressure.   EKG reassuring.  Will get cardiac monitoring EKG and troponin.   Amanda Arias, MD 06/26/23 1257

## 2023-06-26 NOTE — ED Triage Notes (Signed)
 EMs stated, I [facility] for lidocaine  injections with epi for a pelvic exam and started having some tachycardia with chest pain in middle and left arm pain. Initially both arms now only the left. 20g in left AC 324 ASA

## 2023-06-26 NOTE — ED Provider Notes (Signed)
 [place] [facility] AT [facility] Provider Note   CSN: [id number] Arrival date & time: 06/26/23  1206     History  Chief Complaint  Patient presents with   Chest Pain   Arm Pain    Amanda [name] is a 83 y.o. female with past medical history hypothyroidism and urinary incontinence presents due to chest pain.  Patient states that she was having a procedure done this morning prior to 11 AM with her gynecologist when she began experiencing chest pain during numbing with lidocaine .  She states that she had numbness and pain to her bilateral upper extremities, numbness to the left arm.  States that the pain resolved just prior to me seeing her.  She also had some shortness of breath with this.  Denies any history of prior heart attacks.  Husband is at bedside and states that she has seemed a little more short of breath over the last few weeks upon exertion but otherwise no new symptoms.  She did receive aspirin  prior to arrival.   Chest Pain Arm Pain Associated symptoms include chest pain.       Home Medications Prior to Admission medications   Medication Sig Start Date End Date Taking? Authorizing Provider  acetaminophen  (TYLENOL ) 325 MG tablet Take 1-2 tablets (325-650 mg total) by mouth every 8 (eight) hours as needed for mild pain. 04/19/20   Love, Renay Carota, PA-C  albuterol  (VENTOLIN  HFA) 108 (90 Base) MCG/ACT inhaler INHALE 2 PUFFS INTO THE LUNGS EVERY 6 HOURS AS NEEDED FOR WHEEZING OR SHORTNESS OF BREATH 01/24/21   Crecencio Dodge, Candida Chalk, DO  amLODipine  (NORVASC ) 10 MG tablet Take 1 tablet (10 mg total) by mouth daily. 05/15/23   Lowne Chase, Yvonne R, DO  aspirin  81 MG chewable tablet Chew 1 tablet (81 mg total) by mouth daily. 12/25/18   Angiulli, Everlyn Hockey, PA-C  buPROPion  (WELLBUTRIN  XL) 150 MG 24 hr tablet Take 1 tablet (150 mg total) by mouth daily. 03/21/23   Lowne Chase, Yvonne R, DO  Calcium -Cholecalciferol (QC CALCIUM  500MG -D3) 500-5 MG-MCG TABS Take 500  mg by mouth daily.    [provider]  Cholecalciferol (VITAMIN D3 ADULT GUMMIES) 25 MCG (1000 UT) CHEW Chew 2 each by mouth daily.    [provider]  guaiFENesin  (MUCINEX ) 600 MG 12 hr tablet Take 600 mg by mouth daily.    [provider]  ibuprofen (ADVIL) 200 MG tablet Take 200 mg by mouth daily as needed.    [provider]  levothyroxine  (SYNTHROID ) 125 MCG tablet TAKE 1 TABLET(125 MCG) BY MOUTH DAILY BEFORE BREAKFAST 07/18/22   Crecencio Dodge, Yvonne R, DO  pantoprazole  (PROTONIX ) 40 MG tablet TAKE 1 TABLET(40 MG) BY MOUTH DAILY 04/17/23   Crecencio Dodge, Yvonne R, DO  rosuvastatin  (CRESTOR ) 5 MG tablet TAKE 1 TABLET BY MOUTH ON MONDAY, WEDNESDAY AND FRIDAY 12/12/22   Crecencio Dodge, Yvonne R, DO  senna-docusate (SENOKOT-S) 8.6-50 MG tablet Take 2 tablets by mouth at bedtime. 04/19/20   Love, Renay Carota, PA-C  sulfamethoxazole -trimethoprim  (BACTRIM  DS) 800-160 MG tablet Take 1 tablet by mouth 2 (two) times daily. 04/15/23   Darlene Ehlers, MD      Allergies    Phenergan  [promethazine  hcl], Preservision areds 2 [multiple vitamins-minerals], Gabapentin , Levaquin  [levofloxacin ], and Pravastatin    Review of Systems   Review of Systems  Cardiovascular:  Positive for chest pain.    Physical Exam Updated Vital Signs BP (!) 163/75 (BP Location: Right Arm)  Pulse 67   Temp 97.9 F (36.6 C) (Oral)   Resp 19   Ht 5' (1.524 m)   Wt 65.8 kg   SpO2 97%   BMI 28.32 kg/m  Physical Exam Vitals and nursing note reviewed.  Constitutional:      General: She is not in acute distress.    Appearance: She is well-developed.  HENT:     Head: Normocephalic and atraumatic.  Eyes:     Conjunctiva/sclera: Conjunctivae normal.  Cardiovascular:     Rate and Rhythm: Normal rate and regular rhythm.     Heart sounds: No murmur heard. Pulmonary:     Effort: Pulmonary effort is normal. No respiratory distress.     Breath sounds: Normal breath sounds.  Abdominal:     General:  Abdomen is flat. There is no distension.     Palpations: Abdomen is soft.     Tenderness: There is no abdominal tenderness. There is no guarding or rebound.  Musculoskeletal:        General: No swelling.     Cervical back: Neck supple.     Right lower leg: No edema.     Left lower leg: No edema.  Skin:    General: Skin is warm and dry.     Capillary Refill: Capillary refill takes less than 2 seconds.  Neurological:     Mental Status: She is alert.  Psychiatric:        Mood and Affect: Mood normal.     ED Results / Procedures / Treatments   Labs (all labs ordered are listed, but only abnormal results are displayed) Labs Reviewed  BASIC METABOLIC PANEL WITH GFR - Abnormal; Notable for the following components:      Result Value   CO2 20 (*)    Glucose, Bld 104 (*)    BUN 24 (*)    Creatinine, Ser 1.01 (*)    GFR, Estimated 55 (*)    All other components within normal limits  CBC - Abnormal; Notable for the following components:   RBC 5.14 (*)    All other components within normal limits  TROPONIN I (HIGH SENSITIVITY) - Abnormal; Notable for the following components:   Troponin I (High Sensitivity) 2,467 (*)    All other components within normal limits  TROPONIN I (HIGH SENSITIVITY)    EKG EKG Interpretation Date/Time:  Thursday June 26 2023 12:30:47 EDT Ventricular Rate:  81 PR Interval:  174 QRS Duration:  82 QT Interval:  374 QTC Calculation: 434 R Axis:   -48  Text Interpretation: Normal sinus rhythm Left axis deviation Anterior infarct , age undetermined Abnormal ECG When compared with ECG of 21-Jan-2021 16:41, PREVIOUS ECG IS PRESENT when compared to prior, similar appearance No STEMI Confirmed by Wynell Heath ([id number]) on 06/26/2023 4:07:39 PM  Radiology DG Chest 2 View Result Date: 06/26/2023 CLINICAL DATA:  Chest pain EXAM: CHEST - 2 VIEW COMPARISON:  March 21, 2023 FINDINGS: The heart size and mediastinal contours are within normal limits. Both lungs are  clear. The visualized skeletal structures are unremarkable. IMPRESSION: No active cardiopulmonary disease.  COPD changes. Old fracture deformity of the right humeral head and neck Electronically Signed   By: Fredrich Jefferson M.D.   On: 06/26/2023 14:24    Procedures Procedures    Medications Ordered in ED Medications - No data to display  ED Course/ Medical Decision Making/ A&P  Medical Decision Making Amount and/or Complexity of Data Reviewed Labs: ordered. Radiology: ordered.  Risk Prescription drug management.   Patient is alert, afebrile, and hemodynamically stable in no acute distress.  Physical exam as noted above.  Differential includes ACS, PE, pneumonia, pneumothorax, gastritis, CHF, amongst other diagnoses.  Through triage, patient obtained EKG.  I personally interpreted patient's EKG, which demonstrated sinus rhythm with normal intervals.  There potentially subtle depressions in V3 and V4, otherwise no T wave inversions in concordant leads or other ST changes.  I personally interpreted patient's chest x-ray, which demonstrated no focal consolidations concerning for pneumonia.  Labs resulted with unremarkable CBC and BMP, first troponin elevated at 2467, second troponin pending.  At this time, concern for NSTEMI.  Will initiate heparin , confirmed with patient no prior recent CVA or GI bleeds.  Cardiology was consulted and saw the patient at bedside in the ED.  Patient will be admitted for further monitoring and management.  Patient seen in conjunction with Dr. Manus Sellers, who agreed with the above work-up and plan of care.        Final Clinical Impression(s) / ED Diagnoses Final diagnoses:  None    Rx / DC Orders ED Discharge Orders     None         Lorain Robson, MD 06/26/23 1649    Tegeler, Marine Sia, MD 06/27/23 [phone]

## 2023-06-26 NOTE — Addendum Note (Signed)
 Addended by: Graciela Lava on: 06/26/2023 04:54 PM   Modules accepted: Orders

## 2023-06-27 ENCOUNTER — Encounter: Payer: Self-pay | Admitting: Hematology & Oncology

## 2023-06-27 ENCOUNTER — Other Ambulatory Visit (HOSPITAL_COMMUNITY): Payer: Self-pay

## 2023-06-27 ENCOUNTER — Other Ambulatory Visit: Payer: Self-pay | Admitting: Physician Assistant

## 2023-06-27 ENCOUNTER — Encounter (HOSPITAL_COMMUNITY)
Admission: EM | Disposition: A | Discharge status: Home / Self Care | Payer: Self-pay | Source: Ambulatory Visit | Attending: Internal Medicine

## 2023-06-27 ENCOUNTER — Inpatient Hospital Stay (HOSPITAL_COMMUNITY)

## 2023-06-27 DIAGNOSIS — I214 Non-ST elevation (NSTEMI) myocardial infarction: Secondary | ICD-10-CM

## 2023-06-27 HISTORY — PX: LEFT HEART CATH AND CORONARY ANGIOGRAPHY: CATH118249

## 2023-06-27 LAB — ECHOCARDIOGRAM COMPLETE
Area-P 1/2: 3.99 cm2
Height: 60 in
S' Lateral: 2.5 cm
Weight: 2320 [oz_av]

## 2023-06-27 LAB — CBC
HCT: 38.2 % (ref 36.0–46.0)
Hemoglobin: 12.8 g/dL (ref 12.0–15.0)
MCH: 28.3 pg (ref 26.0–34.0)
MCHC: 33.5 g/dL (ref 30.0–36.0)
MCV: 84.3 fL (ref 80.0–100.0)
Platelets: 209 10*3/uL (ref 150–400)
RBC: 4.53 MIL/uL (ref 3.87–5.11)
RDW: 14 % (ref 11.5–15.5)
WBC: 7.2 10*3/uL (ref 4.0–10.5)
nRBC: 0 % (ref 0.0–0.2)

## 2023-06-27 LAB — BASIC METABOLIC PANEL WITH GFR
Anion gap: 8 (ref 5–15)
BUN: 20 mg/dL (ref 8–23)
CO2: 25 mmol/L (ref 22–32)
Calcium: 8.8 mg/dL — ABNORMAL LOW (ref 8.9–10.3)
Chloride: 106 mmol/L (ref 98–111)
Creatinine, Ser: 1.05 mg/dL — ABNORMAL HIGH (ref 0.44–1.00)
GFR, Estimated: 52 mL/min — ABNORMAL LOW (ref >60)
Glucose, Bld: 108 mg/dL — ABNORMAL HIGH (ref 70–99)
Potassium: 4.2 mmol/L (ref 3.5–5.1)
Sodium: 139 mmol/L (ref 135–145)

## 2023-06-27 LAB — HEPARIN LEVEL (UNFRACTIONATED)
Heparin Unfractionated: 0.23 [IU]/mL — ABNORMAL LOW (ref 0.30–0.70)
Heparin Unfractionated: 0.32 [IU]/mL (ref 0.30–0.70)

## 2023-06-27 LAB — TROPONIN I (HIGH SENSITIVITY): Troponin I (High Sensitivity): 1059 ng/L (ref <18)

## 2023-06-27 LAB — LIPID PANEL
Cholesterol: 186 mg/dL (ref 0–200)
HDL: 64 mg/dL (ref >40)
LDL Cholesterol: 110 mg/dL — ABNORMAL HIGH (ref 0–99)
Total CHOL/HDL Ratio: 2.9 ratio
Triglycerides: 60 mg/dL (ref <150)
VLDL: 12 mg/dL (ref 0–40)

## 2023-06-27 SURGERY — LEFT HEART CATH AND CORONARY ANGIOGRAPHY
Anesthesia: LOCAL

## 2023-06-27 MED ORDER — ROSUVASTATIN CALCIUM 20 MG PO TABS
20.0000 mg | ORAL_TABLET | Freq: Every day | ORAL | 3 refills | Status: DC
  Filled 2023-06-27: qty 90, 90d supply, fill #0

## 2023-06-27 MED ORDER — MIDAZOLAM HCL 2 MG/2ML IJ SOLN
INTRAMUSCULAR | Status: AC
  Filled 2023-06-27: qty 2

## 2023-06-27 MED ORDER — AMLODIPINE BESYLATE 10 MG PO TABS
10.0000 mg | ORAL_TABLET | Freq: Every day | ORAL | Status: DC
  Administered 2023-06-27: 10 mg via ORAL
  Filled 2023-06-27: qty 2

## 2023-06-27 MED ORDER — NITROGLYCERIN 0.4 MG SL SUBL: 0.4000 mg | SUBLINGUAL_TABLET | SUBLINGUAL | Status: DC | PRN

## 2023-06-27 MED ORDER — METOPROLOL SUCCINATE ER 25 MG PO TB24
25.0000 mg | ORAL_TABLET | Freq: Every day | ORAL | Status: DC
  Administered 2023-06-27: 25 mg via ORAL
  Filled 2023-06-27: qty 1

## 2023-06-27 MED ORDER — VERAPAMIL HCL 2.5 MG/ML IV SOLN
INTRAVENOUS | Status: AC
  Filled 2023-06-27: qty 2

## 2023-06-27 MED ORDER — LIDOCAINE HCL (PF) 1 % IJ SOLN
INTRAMUSCULAR | Status: DC | PRN
  Administered 2023-06-27: 2 mL

## 2023-06-27 MED ORDER — ROSUVASTATIN CALCIUM 20 MG PO TABS
20.0000 mg | ORAL_TABLET | Freq: Every day | ORAL | Status: DC
  Administered 2023-06-27: 20 mg via ORAL
  Filled 2023-06-27: qty 1

## 2023-06-27 MED ORDER — LIDOCAINE HCL (PF) 1 % IJ SOLN
INTRAMUSCULAR | Status: AC
  Filled 2023-06-27: qty 30

## 2023-06-27 MED ORDER — NITROGLYCERIN 0.4 MG SL SUBL
0.4000 mg | SUBLINGUAL_TABLET | SUBLINGUAL | 3 refills | Status: DC | PRN
  Filled 2023-06-27: qty 25, 7d supply, fill #0

## 2023-06-27 MED ORDER — HEPARIN SODIUM (PORCINE) 1000 UNIT/ML IJ SOLN
INTRAMUSCULAR | Status: AC
  Filled 2023-06-27: qty 10

## 2023-06-27 MED ORDER — PANTOPRAZOLE SODIUM 20 MG PO TBEC
20.0000 mg | DELAYED_RELEASE_TABLET | Freq: Every day | ORAL | Status: DC
  Administered 2023-06-27: 20 mg via ORAL
  Filled 2023-06-27: qty 1

## 2023-06-27 MED ORDER — LEVOTHYROXINE SODIUM 25 MCG PO TABS
125.0000 ug | ORAL_TABLET | Freq: Every day | ORAL | Status: DC
  Administered 2023-06-27: 125 ug via ORAL
  Filled 2023-06-27: qty 1

## 2023-06-27 MED ORDER — FENTANYL CITRATE (PF) 100 MCG/2ML IJ SOLN
INTRAMUSCULAR | Status: AC
  Filled 2023-06-27: qty 2

## 2023-06-27 MED ORDER — SODIUM CHLORIDE 0.9 % WEIGHT BASED INFUSION
1.0000 mL/kg/h | INTRAVENOUS | Status: DC
  Administered 2023-06-27: 1 mL/kg/h via INTRAVENOUS

## 2023-06-27 MED ORDER — SULFAMETHOXAZOLE-TRIMETHOPRIM 800-160 MG PO TABS
1.0000 | ORAL_TABLET | Freq: Two times a day (BID) | ORAL | Status: AC
  Administered 2023-06-27: 1 via ORAL
  Filled 2023-06-27: qty 1

## 2023-06-27 MED ORDER — SODIUM CHLORIDE 0.9 % WEIGHT BASED INFUSION: 1.0000 mL/kg/h | INTRAVENOUS | Status: DC

## 2023-06-27 MED ORDER — SODIUM CHLORIDE 0.9 % WEIGHT BASED INFUSION: 3.0000 mL/kg/h | INTRAVENOUS | Status: DC

## 2023-06-27 MED ORDER — BUPROPION HCL ER (XL) 150 MG PO TB24
150.0000 mg | ORAL_TABLET | Freq: Every day | ORAL | Status: DC
  Administered 2023-06-27: 150 mg via ORAL
  Filled 2023-06-27: qty 1

## 2023-06-27 MED ORDER — HEPARIN (PORCINE) IN NACL 1000-0.9 UT/500ML-% IV SOLN
INTRAVENOUS | Status: DC | PRN
  Administered 2023-06-27 (×2): 500 mL

## 2023-06-27 MED ORDER — ACETAMINOPHEN 325 MG PO TABS: 650.0000 mg | ORAL_TABLET | ORAL | Status: DC | PRN

## 2023-06-27 MED ORDER — FENTANYL CITRATE (PF) 100 MCG/2ML IJ SOLN
INTRAMUSCULAR | Status: DC | PRN
  Administered 2023-06-27: 25 ug via INTRAVENOUS

## 2023-06-27 MED ORDER — GUAIFENESIN ER 600 MG PO TB12
600.0000 mg | ORAL_TABLET | Freq: Every day | ORAL | Status: DC
  Administered 2023-06-27: 600 mg via ORAL
  Filled 2023-06-27: qty 1

## 2023-06-27 MED ORDER — MIDAZOLAM HCL 2 MG/2ML IJ SOLN
INTRAMUSCULAR | Status: DC | PRN
  Administered 2023-06-27: 1 mg via INTRAVENOUS

## 2023-06-27 MED ORDER — HEPARIN SODIUM (PORCINE) 1000 UNIT/ML IJ SOLN
INTRAMUSCULAR | Status: DC | PRN
  Administered 2023-06-27: 3500 [IU] via INTRAVENOUS

## 2023-06-27 MED ORDER — ONDANSETRON HCL 4 MG/2ML IJ SOLN: 4.0000 mg | Freq: Four times a day (QID) | INTRAMUSCULAR | Status: DC | PRN

## 2023-06-27 MED ORDER — IOHEXOL 350 MG/ML SOLN
INTRAVENOUS | Status: DC | PRN
Start: 2023-06-27 — End: 2023-06-27
  Administered 2023-06-27: 45 mL

## 2023-06-27 MED ORDER — ASPIRIN 81 MG PO CHEW: 81.0000 mg | CHEWABLE_TABLET | Freq: Once | ORAL | Status: DC

## 2023-06-27 MED ORDER — SODIUM CHLORIDE 0.9 % WEIGHT BASED INFUSION
3.0000 mL/kg/h | INTRAVENOUS | Status: AC
  Administered 2023-06-27: 3 mL/kg/h via INTRAVENOUS

## 2023-06-27 MED ORDER — ASPIRIN 81 MG PO CHEW
81.0000 mg | CHEWABLE_TABLET | Freq: Every day | ORAL | Status: DC
  Administered 2023-06-27: 81 mg via ORAL
  Filled 2023-06-27: qty 1

## 2023-06-27 MED ORDER — VERAPAMIL HCL 2.5 MG/ML IV SOLN
INTRAVENOUS | Status: DC | PRN
  Administered 2023-06-27: 10 mL via INTRA_ARTERIAL

## 2023-06-27 SURGICAL SUPPLY — 6 items
CATH 5FR JL3.5 JR4 ANG PIG MP (CATHETERS) IMPLANT
DEVICE RAD COMP TR BAND LRG (VASCULAR PRODUCTS) IMPLANT
GLIDESHEATH SLEND SS 6F .021 (SHEATH) IMPLANT
GUIDEWIRE INQWIRE 1.5J.035X260 (WIRE) IMPLANT
PACK CARDIAC CATHETERIZATION (CUSTOM PROCEDURE TRAY) ×1 IMPLANT
SET ATX-X65L (MISCELLANEOUS) IMPLANT

## 2023-06-27 NOTE — Discharge Summary (Signed)
 Discharge Summary    Patient ID: Amanda [name] MRN: [id number]; DOB: May 23, 1940  Admit date: 06/26/2023 Discharge date: 06/27/2023  PCP:  Estill Hemming, DO   [facility] Providers Cardiologist:  Hazle Lites, MD   Discharge Diagnoses    Principal Problem:   NSTEMI (non-ST elevated myocardial infarction) [facility]) Active Problems:   Hypothyroidism   HLD (hyperlipidemia)   Hypertension   Non-ST elevation (NSTEMI) myocardial infarction [facility])    Diagnostic Studies/Procedures    LHC 06/27/23: Mild luminal irregularities in the proximal LAD and proximal RCA Normal LV systolic function   Recommendations: Medical management of mild CAD _____________   History of Present Illness     Amanda [name] is a 83 y.o. female with a hx of hypothyroidism, essential hypertension, hyperlipidemia, and urinary incontinence.   This is an 42 female patient whose husband is a patient of mine.  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  She recently had been having some shortness of breath.  She had an echocardiogram in January 2025 which showed LVEF 60 to 65%, grade 1 diastolic dysfunction and no significant valve findings.  She was undergoing an outpatient procedure for urinary incontinence when after being injected lidocaine  and started having left upper chest pain.  This radiated to both arms and became quite severe.  The procedure was aborted and EMS was called.  She was given aspirin  and brought to the 2467 with a subsequent troponin of 4147.  Fortunately shortly after presenting the ER her chest pain symptoms resolved.  She was placed on IV heparin  and cardiology was notified.  EKG personally reviewed shows no ischemic changes.  On exam she is sitting upright appears in no acute distress, pale, lungs clear, heart regular rate and rhythm, no murmur, abdomen soft  nontender, extremities without edema, awake and orient x 3, follows commands, psych: Pleasant.   Initial troponin was quite elevated for being only within a few hours of the event and has subsequently gone higher.  This is more suggestive of NSTEMI although I would also consider possibility of Takotsubo cardiomyopathy.  Given her age and risk factors she should undergo coronary catheterization.  I discussed the risks, benefits and alternatives of this and she is agreeable to proceed tomorrow.  Trend troponin. Will also obtain an echocardiogram.  Continue heparin  and increase her statin to high intensity rosuvastatin  20 mg daily.  Check a fasting lipid profile in the a.m. as well as an LP(a).  Will add beta-blocker.  Please keep n.p.o. after midnight for cath tomorrow.    Hospital Course     Consultants: none  NSTEMI HS troponin peaked at 4147 then declined to 1719 and 1059. She was treated with IV heparin . No further chest pain, but definitive angiography recommended. LHC today showed only mild luminal irregularities and LV gram appeared normal, did not look like takotsubo. Echocardiogram still pending. Given reassuring LV gram, she may discharge with outpatient echo.   Through discussion with Dr. Abel Hoe, there is question of possible vasospasm causing her chest pain.    Hypertension Continue home 10 mg amlodipine . Will stop BB. Could consider imdur if she has further episodes.    Hyperlipidemia with LDL goal < 70 06/27/2023: Cholesterol 186; HDL 64; LDL Cholesterol 110; Triglycerides 60; VLDL 12 LPA pending Increase crestor  to 20 mg from 5 mg.    Hypothyroidism Continue home synthroid    Pt seen and examined  and deemed stable for discharge once TR band is removed and she has ambulated in the hall. Will discharge with close cardiology follow up and outpatient echocardiogram, if she doesn't have her echo inpatient (I did not order outpatient echo yet).        Did the patient have an  acute coronary syndrome (MI, NSTEMI, STEMI, etc) this admission?:  No.   The elevated Troponin was due to the acute medical illness (demand ischemia).       The patient will be scheduled for a TOC follow up appointment in 7-14 days.  A message has been sent to the [facility] and Scheduling Pool at the office where the patient should be seen for follow up.  _____________  Discharge Vitals Blood pressure 116/60, pulse 61, temperature 97.9 F (36.6 C), resp. rate (!) 21, height 5' (1.524 m), weight 65.8 kg, SpO2 93%.  Filed Weights   06/26/23 1227  Weight: 65.8 kg    Labs & Radiologic Studies    CBC Recent Labs    06/26/23 1344 06/27/23 0112  WBC 8.2 7.2  HGB 14.4 12.8  HCT 44.2 38.2  MCV 86.0 84.3  PLT 262 209   Basic Metabolic Panel Recent Labs    40/98/11 1344 06/27/23 0841  NA 138 139  K 4.3 4.2  CL 104 106  CO2 20* 25  GLUCOSE 104* 108*  BUN 24* 20  CREATININE 1.01* 1.05*  CALCIUM  9.2 8.8*   Liver Function Tests No results for input(s): "AST", "ALT", "ALKPHOS", "BILITOT", "PROT", "ALBUMIN " in the last 72 hours. No results for input(s): "LIPASE", "AMYLASE" in the last 72 hours. High Sensitivity Troponin:   Recent Labs  Lab 06/26/23 1344 06/26/23 1521 06/26/23 2153 06/27/23 0112  TROPONINIHS 2,467* 4,147* 1,719* 1,059*    BNP Invalid input(s): "POCBNP" D-Dimer No results for input(s): "DDIMER" in the last 72 hours. Hemoglobin A1C No results for input(s): "HGBA1C" in the last 72 hours. Fasting Lipid Panel Recent Labs    06/27/23 0841  CHOL 186  HDL 64  LDLCALC 110*  TRIG 60  CHOLHDL 2.9   Thyroid  Function Tests No results for input(s): "TSH", "T4TOTAL", "T3FREE", "THYROIDAB" in the last 72 hours.  Invalid input(s): "FREET3" _____________  CARDIAC CATHETERIZATION Result Date: 06/27/2023 Mild luminal irregularities in the proximal LAD and proximal RCA Normal LV systolic function Recommendations: Medical management of mild CAD  DG Chest 2  View Result Date: 06/26/2023 CLINICAL DATA:  Chest pain EXAM: CHEST - 2 VIEW COMPARISON:  March 21, 2023 FINDINGS: The heart size and mediastinal contours are within normal limits. Both lungs are clear. The visualized skeletal structures are unremarkable. IMPRESSION: No active cardiopulmonary disease.  COPD changes. Old fracture deformity of the right humeral head and neck Electronically Signed   By: Fredrich Jefferson M.D.   On: 06/26/2023 14:24   XR C-ARM NO REPORT Result Date: 06/23/2023 Please see Notes tab for imaging impression.  Disposition   Pt is being discharged home today in good condition.  Follow-up Plans & Appointments        Discharge Medications   Allergies as of 06/27/2023       Reactions   Phenergan  [promethazine  Hcl] Other (See Comments)   Jerking/agitation   Preservision Areds 2 [multiple Vitamins-minerals] Nausea Only   Gabapentin  Other (See Comments)   Levaquin  [levofloxacin ] Nausea And Vomiting   Pravastatin Nausea And Vomiting        Medication List     STOP taking these medications    aspirin   81 MG chewable tablet   sulfamethoxazole -trimethoprim  800-160 MG tablet Commonly known as: BACTRIM  DS       TAKE these medications    acetaminophen  325 MG tablet Commonly known as: TYLENOL  Take 1-2 tablets (325-650 mg total) by mouth every 8 (eight) hours as needed for mild pain.   albuterol  108 (90 Base) MCG/ACT inhaler Commonly known as: VENTOLIN  HFA INHALE 2 PUFFS INTO THE LUNGS EVERY 6 HOURS AS NEEDED FOR WHEEZING OR SHORTNESS OF BREATH   amLODipine  10 MG tablet Commonly known as: NORVASC  Take 1 tablet (10 mg total) by mouth daily.   buPROPion  150 MG 24 hr tablet Commonly known as: WELLBUTRIN  XL Take 1 tablet (150 mg total) by mouth daily. What changed: when to take this   guaiFENesin  600 MG 12 hr tablet Commonly known as: MUCINEX  Take 600 mg by mouth daily.   ibuprofen 200 MG tablet Commonly known as: ADVIL Take 200 mg by mouth daily  as needed for mild pain (pain score 1-3).   levothyroxine  125 MCG tablet Commonly known as: SYNTHROID  TAKE 1 TABLET(125 MCG) BY MOUTH DAILY BEFORE BREAKFAST   nitroGLYCERIN  0.4 MG SL tablet Commonly known as: NITROSTAT  Place 1 tablet (0.4 mg total) under the tongue every 5 (five) minutes x 3 doses as needed for chest pain.   pantoprazole  40 MG tablet Commonly known as: PROTONIX  TAKE 1 TABLET(40 MG) BY MOUTH DAILY   QC Calcium  500mg -D3 500-5 MG-MCG Tabs Generic drug: Calcium -Cholecalciferol Take 500 mg by mouth daily.   rosuvastatin  20 MG tablet Commonly known as: CRESTOR  Take 1 tablet (20 mg total) by mouth daily. Start taking on: June 28, 2023 What changed:  medication strength See the new instructions.   senna-docusate 8.6-50 MG tablet Commonly known as: Senokot-S Take 2 tablets by mouth at bedtime. What changed:  when to take this reasons to take this   Vitamin D3 Adult Gummies 25 MCG (1000 UT) Chew Generic drug: Cholecalciferol Chew 2 each by mouth daily.           Outstanding Labs/Studies    Duration of Discharge Encounter: APP Time: 25 minutes   Signed, Lamond Pilot, PA 06/27/2023, 2:43 PM

## 2023-06-27 NOTE — Telephone Encounter (Signed)
 Patient came for her procedure on 06-26-2023 and had to leave via ambulance.

## 2023-06-27 NOTE — Progress Notes (Signed)
  Echocardiogram 2D Echocardiogram has been performed.  Talen Poser 06/27/2023, 4:36 PM

## 2023-06-27 NOTE — Interval H&P Note (Signed)
 History and Physical Interval Note:  06/27/2023 12:44 PM  Amanda [name]  has presented today for surgery, with the diagnosis of NSTEMI.  The various methods of treatment have been discussed with the patient and family. After consideration of risks, benefits and other options for treatment, the patient has consented to  Procedure(s): LEFT HEART CATH AND CORONARY ANGIOGRAPHY (N/A) as a surgical intervention.  The patient's history has been reviewed, patient examined, no change in status, stable for surgery.  I have reviewed the patient's chart and labs.  Questions were answered to the patient's satisfaction.    Cath Lab Visit (complete for each Cath Lab visit)  Clinical Evaluation Leading to the Procedure:   ACS: Yes.    Non-ACS:    Anginal Classification: CCS III  Anti-ischemic medical therapy: Maximal Therapy (2 or more classes of medications)  Non-Invasive Test Results: No non-invasive testing performed  Prior CABG: No previous CABG   Amanda [name]

## 2023-06-27 NOTE — TOC CM/SW Note (Addendum)
 Transition of Care [facility]) - Inpatient Brief Assessment   Patient Details  Name: Amanda [name] MRN: [id number] Date of Birth: 01-Jul-1940  [facility]) CM/SW Contact:    Jennett Model, RN Phone Number: 06/27/2023, 4:27 PM   Clinical Narrative: From home with spouse, has PCP and insurance on file, states has no HH services in place at this time or DME at home.  States family member will transport them home at [employer] and family is support system, states gets medications from [place] on [address].  Pta self ambulatory .   Transition of Care Asessment: Insurance and Status: Insurance coverage has been reviewed Patient has primary care physician: Yes Home environment has been reviewed: home with spouse Prior level of function:: indep Prior/Current Home Services: No current home services Social Drivers of Health Review: SDOH reviewed no interventions necessary Readmission risk has been reviewed: Yes Transition of care needs: no transition of care needs at this time

## 2023-06-27 NOTE — Progress Notes (Signed)
 PHARMACY - ANTICOAGULATION CONSULT NOTE  Pharmacy Consult for heparin  Indication: chest pain/ACS  Patient Measurements: Height: 5' (152.4 cm) Weight: 65.8 kg (145 lb) IBW/kg (Calculated) : 45.5 HEPARIN  DW (KG): 59.5  Vital Signs: Temp: 98.3 F (36.8 C) (04/24 1953) Temp Source: Oral (04/24 1953) BP: 123/58 (04/24 2230) Pulse Rate: 60 (04/24 2230)  Labs: Recent Labs    06/26/23 1344 06/26/23 1521 06/26/23 2153 06/27/23 0112  HGB 14.4  --   --  12.8  HCT 44.2  --   --  38.2  PLT 262  --   --  209  HEPARINUNFRC  --   --   --  0.23*  CREATININE 1.01*  --   --   --   TROPONINIHS 2,467* 4,147* 1,719*  --     Estimated Creatinine Clearance: 35.1 mL/min (A) (by C-G formula based on SCr of 1.01 mg/dL (H)).  Assessment: 33 YOF presenting with CP, elevated troponin, she is not on anticoagulation PTA  AM: heparin  level subtherapeutic on 800 units/hr. Per RN, no signs/symptoms of bleeding or issues with the heparin  infusion running continuously. CBC shows Hgb drop from 14 > 12, plts 200s  Goal of Therapy:  Heparin  level 0.3-0.7 units/ml Monitor platelets by anticoagulation protocol: Yes   Plan:  Increase heparin  gtt to 950 units/hr F/u 8 hour heparin  level F/u plans for [facility]  Young Hensen, PharmD. Clinical Pharmacist 06/27/2023 1:42 AM

## 2023-06-27 NOTE — TOC Transition Note (Signed)
 Transition of Care [facility]) - Discharge Note   Patient Details  Name: Amanda [name] MRN: [id number] Date of Birth: [birth year]  [facility]) CM/SW Contact:  Jennett Model, RN Phone Number: 06/27/2023, 4:29 PM   Clinical Narrative:    For possible dc today, spouse will transport home at dc.         Patient Goals and CMS Choice            Discharge Placement                       Discharge Plan and Services Additional resources added to the After Visit Summary for                                       Social Drivers of Health (SDOH) Interventions SDOH Screenings   Food Insecurity: No Food Insecurity (06/27/2023)  Housing: Unknown (06/27/2023)  Transportation Needs: No Transportation Needs (06/27/2023)  Utilities: Not At Risk (07/16/2022)  Alcohol Screen: Low Risk  (07/16/2022)  Depression (PHQ2-9): Low Risk  (07/16/2022)  Financial Resource Strain: Low Risk  (07/16/2022)  Physical Activity: Insufficiently Active (11/18/2022)  Social Connections: Socially Integrated (06/27/2023)  Stress: No Stress Concern Present (07/16/2022)  Tobacco Use: Low Risk  (06/26/2023)     Readmission Risk Interventions    06/27/2023    4:26 PM  Readmission Risk Prevention Plan  Transportation Screening Complete  Home Care Screening Complete  Medication Review (RN CM) Complete

## 2023-06-27 NOTE — ED Notes (Signed)
Walked patient to the bathroom patient did well 

## 2023-06-27 NOTE — H&P (View-Only) (Signed)
 DAILY PROGRESS NOTE   Patient Name: Amanda [name] Date of Encounter: 06/27/2023 Cardiologist: Hazle Lites, MD  Chief Complaint   No pain overnight  Patient Profile   Amanda [name] is a 83 y.o. female with a hx of hypothyroidism, essential hypertension, hyperlipidemia, urinary incontinence, history of community-acquired pneumonia who is being seen 06/26/2023 for the evaluation of chest pain at the request of Paris Bolds.   Subjective   Troponin peaked at 4147, then declined to 1719 and 1059. On IV heparin , no further chest pain overnight. Plan for LHC today.  Objective   Vitals:   06/27/23 0230 06/27/23 0245 06/27/23 0500 06/27/23 0539  BP:   129/61   Pulse: (!) 53  (!) 54   Resp: 16 16 15    Temp:    97.9 F (36.6 C)  TempSrc:      SpO2: 93%  98%   Weight:      Height:        Intake/Output Summary (Last 24 hours) at 06/27/2023 0818 Last data filed at 06/27/2023 1610 Gross per 24 hour  Intake 145.77 ml  Output --  Net 145.77 ml   Filed Weights   06/26/23 1227  Weight: 65.8 kg    Physical Exam   General appearance: alert and no distress Lungs: clear to auscultation bilaterally Heart: regular rate and rhythm, S1, S2 normal, no murmur, click, rub or gallop Extremities: extremities normal, atraumatic, no cyanosis or edema Neurologic: Grossly normal  Inpatient Medications    Scheduled Meds:  amLODipine   10 mg Oral Daily   aspirin   81 mg Oral Daily   aspirin   81 mg Oral Once   buPROPion   150 mg Oral Daily   guaiFENesin   600 mg Oral Daily   levothyroxine   125 mcg Oral Q0600   metoprolol  succinate  25 mg Oral Daily   pantoprazole   20 mg Oral Daily   rosuvastatin   20 mg Oral Daily   sulfamethoxazole -trimethoprim   1 tablet Oral BID    Continuous Infusions:  sodium chloride      Followed by   sodium chloride      heparin  950 Units/hr (06/27/23 9604)    PRN Meds: acetaminophen , nitroGLYCERIN , ondansetron  (ZOFRAN ) IV   Labs   Results for  orders placed or performed during the hospital encounter of 06/26/23 (from the past 48 hours)  Basic metabolic panel     Status: Abnormal   Collection Time: 06/26/23  1:44 PM  Result Value Ref Range   Sodium 138 135 - 145 mmol/L   Potassium 4.3 3.5 - 5.1 mmol/L   Chloride 104 98 - 111 mmol/L   CO2 20 (L) 22 - 32 mmol/L   Glucose, Bld 104 (H) 70 - 99 mg/dL    Comment: Glucose reference range applies only to samples taken after fasting for at least 8 hours.   BUN 24 (H) 8 - 23 mg/dL   Creatinine, Ser 5.40 (H) 0.44 - 1.00 mg/dL   Calcium  9.2 8.9 - 10.3 mg/dL   GFR, Estimated 55 (L) >60 mL/min    Comment: (NOTE) Calculated using the CKD-EPI Creatinine Equation (2021)    Anion gap 14 5 - 15    Comment: Performed at [facility], [address] [place], [place] [address]  CBC     Status: Abnormal   Collection Time: 06/26/23  1:44 PM  Result Value Ref Range   WBC 8.2 4.0 - 10.5 K/uL   RBC 5.14 (H) 3.87 - 5.11 MIL/uL  Hemoglobin 14.4 12.0 - 15.0 g/dL   HCT 16.1 09.6 - 04.5 %   MCV 86.0 80.0 - 100.0 fL   MCH 28.0 26.0 - 34.0 pg   MCHC 32.6 30.0 - 36.0 g/dL   RDW 40.9 81.1 - 91.4 %   Platelets 262 150 - 400 K/uL   nRBC 0.0 0.0 - 0.2 %    Comment: Performed at [facility], [address], [place], [place] [address]  Troponin I (High Sensitivity)     Status: Abnormal   Collection Time: 06/26/23  1:44 PM  Result Value Ref Range   Troponin I (High Sensitivity) 2,467 (HH) <18 ng/L    Comment: CRITICAL RESULT CALLED TO, READ BACK BY AND VERIFIED WITH ROBERTS,M RN @ 1537 06/26/23 LEONARD,A (NOTE) Elevated high sensitivity troponin I (hsTnI) values and significant  changes across serial measurements may suggest ACS but many other  chronic and acute conditions are known to elevate hsTnI results.  Refer to the "Links" section for chest pain algorithms and additional  guidance. Performed at [facility], [address] [place], [place] [address]   Troponin I (High  Sensitivity)     Status: Abnormal   Collection Time: 06/26/23  3:21 PM  Result Value Ref Range   Troponin I (High Sensitivity) 4,147 (HH) <18 ng/L    Comment: CRITICAL RESULT CALLED TO, READ BACK BY AND VERIFIED WITH R GONZALEZ,RN 1737 06/26/2023 WBOND (NOTE) Elevated high sensitivity troponin I (hsTnI) values and significant  changes across serial measurements may suggest ACS but many other  chronic and acute conditions are known to elevate hsTnI results.  Refer to the "Links" section for chest pain algorithms and additional  guidance. Performed at [facility], [address], [place], [place] [address]   Troponin I (High Sensitivity)     Status: Abnormal   Collection Time: 06/26/23  9:53 PM  Result Value Ref Range   Troponin I (High Sensitivity) 1,719 (HH) <18 ng/L    Comment: CRITICAL VALUE NOTED. VALUE IS CONSISTENT WITH PREVIOUSLY REPORTED/CALLED VALUE DELTA CHECK NOTED (NOTE) Elevated high sensitivity troponin I (hsTnI) values and significant  changes across serial measurements may suggest ACS but many other  chronic and acute conditions are known to elevate hsTnI results.  Refer to the "Links" section for chest pain algorithms and additional  guidance. Performed at [facility], [address], [place], [place] [address]   Heparin  level (unfractionated)     Status: Abnormal   Collection Time: 06/27/23  1:12 AM  Result Value Ref Range   Heparin  Unfractionated 0.23 (L) 0.30 - 0.70 IU/mL    Comment: (NOTE) The clinical reportable range upper limit is being lowered to >1.10 to align with the FDA approved guidance for the current laboratory assay.  If heparin  results are below expected values, and patient dosage has  been confirmed, suggest follow up testing of antithrombin III levels. Performed at [facility], [address], [place], [place] [address]   CBC     Status: None   Collection Time: 06/27/23  1:12 AM  Result Value Ref Range   WBC 7.2 4.0  - 10.5 K/uL   RBC 4.53 3.87 - 5.11 MIL/uL   Hemoglobin 12.8 12.0 - 15.0 g/dL   HCT 13.2 44.0 - 10.2 %   MCV 84.3 80.0 - 100.0 fL   MCH 28.3 26.0 - 34.0 pg   MCHC 33.5 30.0 - 36.0 g/dL   RDW 72.5 36.6 -  15.5 %   Platelets 209 150 - 400 K/uL   nRBC 0.0 0.0 - 0.2 %    Comment: Performed at [facility], [address], [place], [place] [address]  Troponin I (High Sensitivity)     Status: Abnormal   Collection Time: 06/27/23  1:12 AM  Result Value Ref Range   Troponin I (High Sensitivity) 1,059 (HH) <18 ng/L    Comment: CRITICAL VALUE NOTED. VALUE IS CONSISTENT WITH PREVIOUSLY REPORTED/CALLED VALUE DELTA CHECK NOTED (NOTE) Elevated high sensitivity troponin I (hsTnI) values and significant  changes across serial measurements may suggest ACS but many other  chronic and acute conditions are known to elevate hsTnI results.  Refer to the "Links" section for chest pain algorithms and additional  guidance. Performed at [facility], [address] [place], [place] [address]    *Note: Due to a large number of results and/or encounters for the requested time period, some results have not been displayed. A complete set of results can be found in Results Review.    ECG   N/A  Telemetry   Sinus bradycardia - Personally Reviewed  Radiology    DG Chest 2 View Result Date: 06/26/2023 CLINICAL DATA:  Chest pain EXAM: CHEST - 2 VIEW COMPARISON:  March 21, 2023 FINDINGS: The heart size and mediastinal contours are within normal limits. Both lungs are clear. The visualized skeletal structures are unremarkable. IMPRESSION: No active cardiopulmonary disease.  COPD changes. Old fracture deformity of the right humeral head and neck Electronically Signed   By: Fredrich Jefferson M.D.   On: 06/26/2023 14:24    Cardiac Studies   Echo pending  Assessment   Principal Problem:   NSTEMI (non-ST elevated myocardial infarction) [facility]) Active Problems:   Non-ST elevation (NSTEMI) myocardial  infarction [facility])   Plan   Troponin peaked and down-trending. NO further chest pain on IV Heparin . Labs stable - plan for LHC today.  Informed Consent   Shared Decision Making/Informed Consent The risks [stroke (1 in 1000), death (1 in 1000), kidney failure [usually temporary] (1 in 500), bleeding (1 in 200), allergic reaction [possibly serious] (1 in 200)], benefits (diagnostic support and management of coronary artery disease) and alternatives of a cardiac catheterization were discussed in detail with Ms. Vanamburg and she is willing to proceed.    Time Spent Directly with Patient:  I have spent a total of 25 minutes with the patient reviewing hospital notes, telemetry, EKGs, labs and examining the patient as well as establishing an assessment and plan that was discussed personally with the patient.  > 50% of time was spent in direct patient care.  Length of Stay:  LOS: 1 day   Hazle Lites, MD, [facility], FNLA, FACP  [place]  [facility]  Medical Director of the Advanced Lipid Disorders &  Cardiovascular Risk Reduction Clinic Diplomate of the American Board of Clinical Lipidology Attending Cardiologist  Direct Dial: [phone]  Fax: [phone]  Website:  [email or web address]  Hazle Lites 06/27/2023, 8:18 AM

## 2023-06-27 NOTE — Progress Notes (Signed)
 PHARMACY - ANTICOAGULATION CONSULT NOTE  Pharmacy Consult for heparin  Indication: chest pain/ACS  Patient Measurements: Height: 5' (152.4 cm) Weight: 65.8 kg (145 lb) IBW/kg (Calculated) : 45.5 HEPARIN  DW (KG): 59.5  Vital Signs: Temp: 97.9 F (36.6 C) (04/25 0539) BP: 140/64 (04/25 1105) Pulse Rate: 59 (04/25 1105)  Labs: Recent Labs    06/26/23 1344 06/26/23 1521 06/26/23 2153 06/27/23 0112 06/27/23 0841 06/27/23 0950  HGB 14.4  --   --  12.8  --   --   HCT 44.2  --   --  38.2  --   --   PLT 262  --   --  209  --   --   HEPARINUNFRC  --   --   --  0.23*  --  0.32  CREATININE 1.01*  --   --   --  1.05*  --   TROPONINIHS 2,467* 4,147* 1,719* 1,059*  --   --     Estimated Creatinine Clearance: 33.7 mL/min (A) (by C-G formula based on SCr of 1.05 mg/dL (H)).  Assessment: 50 YOF presenting with CP, elevated troponin, she is not on anticoagulation PTA  AM: heparin  level low end of therapeutic range on 950 units/hr. Per RN, no signs/symptoms of bleeding or issues with the heparin  infusion running continuously. CBC shows Hgb drop from 14 > 12, plts 200s. Planning for LHC today  Goal of Therapy:  Heparin  level 0.3-0.7 units/ml Monitor platelets by anticoagulation protocol: Yes   Plan:  Increase heparin  gtt to 1000 units/hr Daily heparin  level and CBC HC later today  Mohammed Andrew, PharmD Clinical Pharmacist 06/27/2023 11:09 AM Please check AMION for all [facility] Pharmacy numbers

## 2023-06-27 NOTE — Progress Notes (Addendum)
 DAILY PROGRESS NOTE   Patient Name: REVIA [name] Date of Encounter: 06/27/2023 Cardiologist: Hazle Lites, MD  Chief Complaint   No pain overnight  Patient Profile   Amanda [name] is a 83 y.o. female with a hx of hypothyroidism, essential hypertension, hyperlipidemia, urinary incontinence, history of community-acquired pneumonia who is being seen 06/26/2023 for the evaluation of chest pain at the request of Amanda [name].   Subjective   Troponin peaked at 4147, then declined to 1719 and 1059. On IV heparin , no further chest pain overnight. Plan for LHC today.  Objective   Vitals:   06/27/23 0230 06/27/23 0245 06/27/23 0500 06/27/23 0539  BP:   129/61   Pulse: (!) 53  (!) 54   Resp: 16 16 15    Temp:    97.9 F (36.6 C)  TempSrc:      SpO2: 93%  98%   Weight:      Height:        Intake/Output Summary (Last 24 hours) at 06/27/2023 0818 Last data filed at 06/27/2023 8413 Gross per 24 hour  Intake 145.77 ml  Output --  Net 145.77 ml   Filed Weights   06/26/23 1227  Weight: 65.8 kg    Physical Exam   General appearance: alert and no distress Lungs: clear to auscultation bilaterally Heart: regular rate and rhythm, S1, S2 normal, no murmur, click, rub or gallop Extremities: extremities normal, atraumatic, no cyanosis or edema Neurologic: Grossly normal  Inpatient Medications    Scheduled Meds:  amLODipine   10 mg Oral Daily   aspirin   81 mg Oral Daily   aspirin   81 mg Oral Once   buPROPion   150 mg Oral Daily   guaiFENesin   600 mg Oral Daily   levothyroxine   125 mcg Oral Q0600   metoprolol  succinate  25 mg Oral Daily   pantoprazole   20 mg Oral Daily   rosuvastatin   20 mg Oral Daily   sulfamethoxazole -trimethoprim   1 tablet Oral BID    Continuous Infusions:  sodium chloride      Followed by   sodium chloride      heparin  950 Units/hr (06/27/23 0633)    PRN Meds: acetaminophen , nitroGLYCERIN , ondansetron  (ZOFRAN ) IV   Labs   Results for  orders placed or performed during the hospital encounter of 06/26/23 (from the past 48 hours)  Basic metabolic panel     Status: Abnormal   Collection Time: 06/26/23  1:44 PM  Result Value Ref Range   Sodium 138 135 - 145 mmol/L   Potassium 4.3 3.5 - 5.1 mmol/L   Chloride 104 98 - 111 mmol/L   CO2 20 (L) 22 - 32 mmol/L   Glucose, Bld 104 (H) 70 - 99 mg/dL    Comment: Glucose reference range applies only to samples taken after fasting for at least 8 hours.   BUN 24 (H) 8 - 23 mg/dL   Creatinine, Ser 2.44 (H) 0.44 - 1.00 mg/dL   Calcium  9.2 8.9 - 10.3 mg/dL   GFR, Estimated 55 (L) >60 mL/min    Comment: (NOTE) Calculated using the CKD-EPI Creatinine Equation (2021)    Anion gap 14 5 - 15    Comment: Performed at [facility], [address] [place], [place] [address]  CBC     Status: Abnormal   Collection Time: 06/26/23  1:44 PM  Result Value Ref Range   WBC 8.2 4.0 - 10.5 K/uL   RBC 5.14 (H) 3.87 - 5.11 MIL/uL  Hemoglobin 14.4 12.0 - 15.0 g/dL   HCT 25.3 66.4 - 40.3 %   MCV 86.0 80.0 - 100.0 fL   MCH 28.0 26.0 - 34.0 pg   MCHC 32.6 30.0 - 36.0 g/dL   RDW 47.4 25.9 - 56.3 %   Platelets 262 150 - 400 K/uL   nRBC 0.0 0.0 - 0.2 %    Comment: Performed at [facility], [address], [place], [place] [address]  Troponin I (High Sensitivity)     Status: Abnormal   Collection Time: 06/26/23  1:44 PM  Result Value Ref Range   Troponin I (High Sensitivity) 2,467 (HH) <18 ng/L    Comment: CRITICAL RESULT CALLED TO, READ BACK BY AND VERIFIED WITH ROBERTS,M RN @ 1537 06/26/23 LEONARD,A (NOTE) Elevated high sensitivity troponin I (hsTnI) values and significant  changes across serial measurements may suggest ACS but many other  chronic and acute conditions are known to elevate hsTnI results.  Refer to the "Links" section for chest pain algorithms and additional  guidance. Performed at [facility], [address], [place], [place] [address]   Troponin I (High  Sensitivity)     Status: Abnormal   Collection Time: 06/26/23  3:21 PM  Result Value Ref Range   Troponin I (High Sensitivity) 4,147 (HH) <18 ng/L    Comment: CRITICAL RESULT CALLED TO, READ BACK BY AND VERIFIED WITH R GONZALEZ,RN 1737 06/26/2023 WBOND (NOTE) Elevated high sensitivity troponin I (hsTnI) values and significant  changes across serial measurements may suggest ACS but many other  chronic and acute conditions are known to elevate hsTnI results.  Refer to the "Links" section for chest pain algorithms and additional  guidance. Performed at [facility], [address], [place], [place] [address]   Troponin I (High Sensitivity)     Status: Abnormal   Collection Time: 06/26/23  9:53 PM  Result Value Ref Range   Troponin I (High Sensitivity) 1,719 (HH) <18 ng/L    Comment: CRITICAL VALUE NOTED. VALUE IS CONSISTENT WITH PREVIOUSLY REPORTED/CALLED VALUE DELTA CHECK NOTED (NOTE) Elevated high sensitivity troponin I (hsTnI) values and significant  changes across serial measurements may suggest ACS but many other  chronic and acute conditions are known to elevate hsTnI results.  Refer to the "Links" section for chest pain algorithms and additional  guidance. Performed at [facility], [address] [place], [place] [address]   Heparin  level (unfractionated)     Status: Abnormal   Collection Time: 06/27/23  1:12 AM  Result Value Ref Range   Heparin  Unfractionated 0.23 (L) 0.30 - 0.70 IU/mL    Comment: (NOTE) The clinical reportable range upper limit is being lowered to >1.10 to align with the FDA approved guidance for the current laboratory assay.  If heparin  results are below expected values, and patient dosage has  been confirmed, suggest follow up testing of antithrombin III levels. Performed at [facility], [address], [place], [place] [address]   CBC     Status: None   Collection Time: 06/27/23  1:12 AM  Result Value Ref Range   WBC 7.2 4.0  - 10.5 K/uL   RBC 4.53 3.87 - 5.11 MIL/uL   Hemoglobin 12.8 12.0 - 15.0 g/dL   HCT 09.3 23.5 - 57.3 %   MCV 84.3 80.0 - 100.0 fL   MCH 28.3 26.0 - 34.0 pg   MCHC 33.5 30.0 - 36.0 g/dL   RDW 22.0 25.4 -  15.5 %   Platelets 209 150 - 400 K/uL   nRBC 0.0 0.0 - 0.2 %    Comment: Performed at [facility], [address], [place], [place] [address]  Troponin I (High Sensitivity)     Status: Abnormal   Collection Time: 06/27/23  1:12 AM  Result Value Ref Range   Troponin I (High Sensitivity) 1,059 (HH) <18 ng/L    Comment: CRITICAL VALUE NOTED. VALUE IS CONSISTENT WITH PREVIOUSLY REPORTED/CALLED VALUE DELTA CHECK NOTED (NOTE) Elevated high sensitivity troponin I (hsTnI) values and significant  changes across serial measurements may suggest ACS but many other  chronic and acute conditions are known to elevate hsTnI results.  Refer to the "Links" section for chest pain algorithms and additional  guidance. Performed at [facility], [address] [place], [place] [address]    *Note: Due to a large number of results and/or encounters for the requested time period, some results have not been displayed. A complete set of results can be found in Results Review.    ECG   N/A  Telemetry   Sinus bradycardia - Personally Reviewed  Radiology    DG Chest 2 View Result Date: 06/26/2023 CLINICAL DATA:  Chest pain EXAM: CHEST - 2 VIEW COMPARISON:  March 21, 2023 FINDINGS: The heart size and mediastinal contours are within normal limits. Both lungs are clear. The visualized skeletal structures are unremarkable. IMPRESSION: No active cardiopulmonary disease.  COPD changes. Old fracture deformity of the right humeral head and neck Electronically Signed   By: Fredrich Jefferson M.D.   On: 06/26/2023 14:24    Cardiac Studies   Echo pending  Assessment   Principal Problem:   NSTEMI (non-ST elevated myocardial infarction) [facility]) Active Problems:   Non-ST elevation (NSTEMI) myocardial  infarction [facility])   Plan   Troponin peaked and down-trending. NO further chest pain on IV Heparin . Labs stable - plan for LHC today.  Informed Consent   Shared Decision Making/Informed Consent The risks [stroke (1 in 1000), death (1 in 1000), kidney failure [usually temporary] (1 in 500), bleeding (1 in 200), allergic reaction [possibly serious] (1 in 200)], benefits (diagnostic support and management of coronary artery disease) and alternatives of a cardiac catheterization were discussed in detail with Amanda [name] and she is willing to proceed.    Time Spent Directly with Patient:  I have spent a total of 30 minutes with the patient reviewing hospital notes, telemetry, EKGs, labs and examining the patient as well as establishing an assessment and plan that was discussed personally with the patient.  > 50% of time was spent in direct patient care.  Length of Stay:  LOS: 1 day   Hazle Lites, MD, [facility], FNLA, FACP  [place]  [facility] Heart[facility]  Medical Director of the Advanced Lipid Disorders &  Cardiovascular Risk Reduction Clinic Diplomate of the American Board of Clinical Lipidology Attending Cardiologist  Direct Dial: [phone]  Fax: [phone]  Website:  [email or web address]  Hazle Lites 06/27/2023, 8:18 AM

## 2023-06-27 NOTE — Plan of Care (Signed)
°  Problem: Education: °Goal: Understanding of CV disease, CV risk reduction, and recovery process will improve °Outcome: Adequate for Discharge °Goal: Individualized Educational Video(s) °Outcome: Adequate for Discharge °  °

## 2023-06-29 ENCOUNTER — Encounter (HOSPITAL_COMMUNITY): Payer: Self-pay | Admitting: Cardiovascular Disease

## 2023-06-30 LAB — LIPOPROTEIN A (LPA): Lipoprotein (a): 16.2 nmol/L (ref <75.0)

## 2023-07-01 DIAGNOSIS — M545 Low back pain, unspecified: Secondary | ICD-10-CM | POA: Diagnosis not present

## 2023-07-01 DIAGNOSIS — M542 Cervicalgia: Secondary | ICD-10-CM | POA: Diagnosis not present

## 2023-07-03 DIAGNOSIS — M545 Low back pain, unspecified: Secondary | ICD-10-CM | POA: Diagnosis not present

## 2023-07-03 DIAGNOSIS — M542 Cervicalgia: Secondary | ICD-10-CM | POA: Diagnosis not present

## 2023-07-08 ENCOUNTER — Encounter: Payer: Self-pay | Admitting: Family Medicine

## 2023-07-08 ENCOUNTER — Ambulatory Visit (INDEPENDENT_AMBULATORY_CARE_PROVIDER_SITE_OTHER): Admitting: Family Medicine

## 2023-07-08 ENCOUNTER — Telehealth: Payer: Self-pay | Admitting: Obstetrics

## 2023-07-08 VITALS — BP 108/70 | HR 73 | Temp 98.2°F | Resp 18 | Ht 60.0 in | Wt 148.0 lb

## 2023-07-08 DIAGNOSIS — N393 Stress incontinence (female) (male): Secondary | ICD-10-CM

## 2023-07-08 DIAGNOSIS — K119 Disease of salivary gland, unspecified: Secondary | ICD-10-CM | POA: Diagnosis not present

## 2023-07-08 DIAGNOSIS — M545 Low back pain, unspecified: Secondary | ICD-10-CM | POA: Diagnosis not present

## 2023-07-08 DIAGNOSIS — I214 Non-ST elevation (NSTEMI) myocardial infarction: Secondary | ICD-10-CM | POA: Diagnosis not present

## 2023-07-08 DIAGNOSIS — M542 Cervicalgia: Secondary | ICD-10-CM | POA: Diagnosis not present

## 2023-07-08 MED ORDER — AMOXICILLIN-POT CLAVULANATE 875-125 MG PO TABS: 1.0000 | ORAL_TABLET | Freq: Two times a day (BID) | ORAL | 0 refills | Status: DC

## 2023-07-08 NOTE — Telephone Encounter (Signed)
 Patient called in wanting to cancel appointment for follow up and said she did not want to reschedule.  She said that she did indeed have a heart attack the day she was taken away by EMS the day of her procedure.  She was not happy with any follow up from our office and did not want to see anyone else in our office.
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
## 2023-07-08 NOTE — Assessment & Plan Note (Signed)
 Augmentin  bid x 10 days  Sour candy Call or return to office if symptoms worsen

## 2023-07-08 NOTE — Assessment & Plan Note (Signed)
 F/u cardiology Pt will start crestor  20 mg daily Echo ordered

## 2023-07-09 ENCOUNTER — Telehealth (HOSPITAL_COMMUNITY): Payer: Self-pay

## 2023-07-09 NOTE — Telephone Encounter (Signed)
 Pt called wanting to schedule for cardiac rehab she stated that amb cardiac rehab referral was on her ppw, I advised pt to hold for a moment. The call ended up disconnecting, tried to call pt back with no answer left a message for pt to call back. Wanted to advised pt to disregard the cardiac rehab referral because per the nurse navigator for cardiac rehab pt does not have a qualifying diagnosis to start the program.

## 2023-07-15 DIAGNOSIS — M542 Cervicalgia: Secondary | ICD-10-CM | POA: Diagnosis not present

## 2023-07-15 DIAGNOSIS — M545 Low back pain, unspecified: Secondary | ICD-10-CM | POA: Diagnosis not present

## 2023-07-16 ENCOUNTER — Encounter: Payer: Self-pay | Admitting: Obstetrics

## 2023-07-17 ENCOUNTER — Ambulatory Visit: Payer: Medicare Other

## 2023-07-17 ENCOUNTER — Ambulatory Visit: Admitting: Nurse Practitioner

## 2023-07-17 DIAGNOSIS — M542 Cervicalgia: Secondary | ICD-10-CM | POA: Diagnosis not present

## 2023-07-17 DIAGNOSIS — M545 Low back pain, unspecified: Secondary | ICD-10-CM | POA: Diagnosis not present

## 2023-07-18 ENCOUNTER — Ambulatory Visit: Admitting: Nurse Practitioner

## 2023-07-23 ENCOUNTER — Encounter (HOSPITAL_COMMUNITY): Payer: Self-pay | Admitting: Family Medicine

## 2023-07-23 ENCOUNTER — Ambulatory Visit: Payer: Medicare Other | Admitting: Obstetrics

## 2023-07-24 DIAGNOSIS — M545 Low back pain, unspecified: Secondary | ICD-10-CM | POA: Diagnosis not present

## 2023-07-24 DIAGNOSIS — M542 Cervicalgia: Secondary | ICD-10-CM | POA: Diagnosis not present

## 2023-07-29 DIAGNOSIS — M545 Low back pain, unspecified: Secondary | ICD-10-CM | POA: Diagnosis not present

## 2023-07-29 DIAGNOSIS — M542 Cervicalgia: Secondary | ICD-10-CM | POA: Diagnosis not present

## 2023-07-31 DIAGNOSIS — M545 Low back pain, unspecified: Secondary | ICD-10-CM | POA: Diagnosis not present

## 2023-07-31 DIAGNOSIS — M542 Cervicalgia: Secondary | ICD-10-CM | POA: Diagnosis not present

## 2023-08-01 ENCOUNTER — Telehealth: Payer: Self-pay

## 2023-08-01 NOTE — Telephone Encounter (Signed)
 Copied from CRM [id number]. Topic: General - Other >> Jul 31, 2023  4:27 PM Howard Macho wrote: Reason for CRM: patient would like to be called to clarify her cholesterol medications. Patient stated she has two different cholesterol medications that was given to her by two different doctors CB [phone]

## 2023-08-01 NOTE — Telephone Encounter (Signed)
 Pt called. LDVM advising to take the medication and to follow up with Cardio

## 2023-08-01 NOTE — Telephone Encounter (Signed)
 Spoke with patient. Pt states prior to the hospital stay she was taking Crestor  5mg  every other day and the hospital put her on Crestor  20mg  to take everyday. Pt states she feels like that is a big jump and doesn't feel comfortable taking medication everyday. Please advise

## 2023-08-05 DIAGNOSIS — M542 Cervicalgia: Secondary | ICD-10-CM | POA: Diagnosis not present

## 2023-08-05 DIAGNOSIS — M545 Low back pain, unspecified: Secondary | ICD-10-CM | POA: Diagnosis not present

## 2023-08-06 ENCOUNTER — Other Ambulatory Visit: Payer: Self-pay | Admitting: Family Medicine

## 2023-08-06 ENCOUNTER — Ambulatory Visit

## 2023-08-06 MED ORDER — LEVOTHYROXINE SODIUM 125 MCG PO TABS: 125.0000 ug | ORAL_TABLET | Freq: Every day | ORAL | 0 refills | Status: DC

## 2023-08-06 NOTE — Telephone Encounter (Signed)
 Copied from CRM [id number]. Topic: Clinical - Medication Refill >> Aug 06, 2023  9:18 AM [name] wrote: Medication: levothyroxine  (SYNTHROID ) 125 MCG tablet  Has the patient contacted their pharmacy? Yes   This is the patient's preferred pharmacy:  [id number] [place], [place] - [address] AT [facility] [address] [facility] [address] [facility] [address] [place] [address] Phone: [phone] Fax: [phone]   Is this the correct pharmacy for this prescription? Yes If no, delete pharmacy and type the correct one.   Has the prescription been filled recently? No  Is the patient out of the medication? Yes  Has the patient been seen for an appointment in the last year OR does the patient have an upcoming appointment? Yes  Can we respond through MyChart? Yes  Agent: Please be advised that Rx refills may take up to 3 business days. We ask that you follow-up with your pharmacy.

## 2023-08-06 NOTE — Telephone Encounter (Signed)
 Last Fill: 07/18/22  Last OV: 07/08/23 Next OV: 06/060/25 AWV  Routing to provider for review/authorization.

## 2023-08-07 ENCOUNTER — Encounter: Payer: Self-pay | Admitting: Pharmacist

## 2023-08-07 DIAGNOSIS — M542 Cervicalgia: Secondary | ICD-10-CM | POA: Diagnosis not present

## 2023-08-07 DIAGNOSIS — M545 Low back pain, unspecified: Secondary | ICD-10-CM | POA: Diagnosis not present

## 2023-08-07 NOTE — Progress Notes (Signed)
 Pharmacy Quality Measure Review  This patient came close to failing cholesterol medication adherence in 2024. Previously she was taking rosuvastatin  3 times per week but was increased to once daily 06/28/2023 after hospitalization.     Medication: rosuvastatin   Last fill date: 06/28/2023 for 90 day supply. [facility] filled #36 = 84 day supply on 05/22/2023 - this was still old direction of 3 times per week.   Reviewed medication indication, dosing, and goals of therapy.  Patient's husband confirmed she is taking rosuvastatin  20mg  EVERY day now.  Will continue to follow adherence in 2025. Patient will contact office if any concerns about side effects or intolerance.   Cecilie Coffee, PharmD Clinical Pharmacist [facility]  Population Health [phone]

## 2023-08-08 ENCOUNTER — Ambulatory Visit: Admitting: *Deleted

## 2023-08-08 ENCOUNTER — Ambulatory Visit: Admitting: Student

## 2023-08-08 ENCOUNTER — Encounter: Payer: Self-pay | Admitting: Student

## 2023-08-08 VITALS — BP 130/68 | HR 63 | Temp 98.0°F | Resp 12 | Ht 61.0 in | Wt 152.0 lb

## 2023-08-08 DIAGNOSIS — M25471 Effusion, right ankle: Secondary | ICD-10-CM | POA: Insufficient documentation

## 2023-08-08 DIAGNOSIS — Z0000 Encounter for general adult medical examination without abnormal findings: Secondary | ICD-10-CM | POA: Diagnosis not present

## 2023-08-08 DIAGNOSIS — M25472 Effusion, left ankle: Secondary | ICD-10-CM

## 2023-08-08 DIAGNOSIS — M25474 Effusion, right foot: Secondary | ICD-10-CM

## 2023-08-08 DIAGNOSIS — R238 Other skin changes: Secondary | ICD-10-CM | POA: Diagnosis not present

## 2023-08-08 DIAGNOSIS — M25475 Effusion, left foot: Secondary | ICD-10-CM

## 2023-08-08 MED ORDER — TRIAMCINOLONE ACETONIDE 0.1 % EX CREA: 1.0000 | TOPICAL_CREAM | Freq: Two times a day (BID) | CUTANEOUS | 0 refills | Status: DC

## 2023-08-08 NOTE — Assessment & Plan Note (Signed)
 triamcinolone   0.1% cream BID.  if no improvement return to clinic to reassess.

## 2023-08-08 NOTE — Progress Notes (Signed)
 Subjective:     Patient ID: Amanda [name], female    DOB: 06-Sep-1940, 83 y.o.   MRN: [id number]  Chief Complaint  Patient presents with   bilateral feet swelling    HPI medical history hypothyroidism, hypertension, hyperlipidemia.  Presents with husband for acute visit.  Pt c/o bilateral lower extremity swelling.  Swelling started 4 days ago.  Has echocardiogram scheduled for next week and appointment with cardiology upcoming. She is on 10 mg amlodipine ,  she has been on this dose since 2022 and has not had issues with swelling while on the medication. No associated SOB, chest pain, or weight gain. Edema is pitting and symmetrical. She denies history of LE swelling. Pt had NSTEMi 06/2023.  Skin Irritation Left foot red dry skin started 1 weeke ago, no itching. Last 4 or 5 days.   Patient denies fever, chills, SOB, CP, palpitations, dyspnea, HA, vision changes, N/V/D, abdominal pain, urinary symptoms, rash, weight changes, and recent illness or hospitalizations.   History of Present Illness              There are no preventive care reminders to display for this patient.   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   Lumbar burst fracture (HCC)    Lymphoproliferative disorder (HCC)    Macular degeneration 2013   Both eyes    Macular degeneration, bilateral    Marginal zone lymphoma (HCC)    Osteopenia    Pneumonia    Pneumonia due to COVID-19 virus 2021   Required hospitalization   PONV (postoperative nausea and vomiting)    needs little anesthesia   Shingles     Shortness of breath    on exertion   Spleen enlarged    SUI (stress urinary incontinence, female)    Urinary, incontinence, stress female    Wears glasses     Past Surgical History:  Procedure Laterality Date   BREAST EXCISIONAL BIOPSY Left 1980   CARPAL TUNNEL RELEASE  1999   CATARACT EXTRACTION  2009, 2011   BOTH EYES   CATARACT EXTRACTION, BILATERAL     CESAREAN SECTION  1959   CESAREAN SECTION     COLONOSCOPY      Dr Dellis Fermo   DILATION AND CURETTAGE OF UTERUS     X2   EYE SURGERY  Cataracts removed   FRACTURE SURGERY  12/12/2018   Left ankle plates   HYSTEROSCOPY WITH D & C  01/07/2012   Procedure: DILATATION AND CURETTAGE /HYSTEROSCOPY;  Surgeon: Davia Erps, MD;  Location: [facility];  Service: Gynecology;  Laterality: N/A;  intrauterine foley catheter for tamponode    IR IMAGING GUIDED PORT INSERTION  07/15/2018   IR REMOVAL TUN ACCESS W/ PORT W/O FL MOD SED  04/11/2021   LEFT HEART CATH AND CORONARY ANGIOGRAPHY N/A 06/27/2023   Procedure: LEFT HEART CATH AND CORONARY ANGIOGRAPHY;  Surgeon: Odie Benne, MD;  Location: [facility];  Service: Cardiovascular;  Laterality: N/A;  LYMPH NODE BIOPSY Left 05/26/2018   Procedure: LEFT AXILLARY LYMPH NODE BIOPSY;  Surgeon: Boyce Byes, MD;  Location[facility];  Service: General;  Laterality: Left;   ORIF ANKLE FRACTURE Left 12/12/2018   ORIF ANKLE FRACTURE Left 12/12/2018   Procedure: OPEN REDUCTION INTERNAL FIXATION (ORIF) ANKLE FRACTURE;  Surgeon: Jasmine Mesi, MD;  Location: [facility] OR;  Service: Orthopedics;  Laterality: Left;   ORIF ANKLE FRACTURE Left 12/2018   TONSILLECTOMY     TONSILLECTOMY AND ADENOIDECTOMY     TUBAL LIGATION     BY LAPAROSCOPY   WISDOM TOOTH EXTRACTION      Family History  Problem Relation Age of Onset   Ovarian cancer Mother    Breast cancer Mother 52   Hypertension Father    Prostate cancer Father    Kidney failure Father    Diabetes Father    Kidney disease Father     Hyperlipidemia Brother    COPD Paternal Grandfather    Stroke Maternal Grandfather    Hypercalcemia Neg Hx     Social History   Socioeconomic History   Marital status: Married    Spouse name: Not on file   Number of children: Not on file   Years of education: Not on file   Highest education level: Not on file  Occupational History   Not on file  Tobacco Use   Smoking status: Never   Smokeless tobacco: Never  Vaping Use   Vaping status: Never Used  Substance and Sexual Activity   Alcohol use: Yes    Alcohol/week: 1.0 standard drink of alcohol    Types: 1 Glasses of wine per week    Comment: RARE   Drug use: Never   Sexual activity: Not Currently    Birth control/protection: Post-menopausal  Other Topics Concern   Not on file  Social History Narrative   ** Merged History Encounter **       ** Merged History Encounter **       Social Drivers of Corporate investment banker Strain: Low Risk  (08/08/2023)   Overall Financial Resource Strain (CARDIA)    Difficulty of Paying Living Expenses: Not very hard  Food Insecurity: No Food Insecurity (08/08/2023)   Hunger Vital Sign    Worried About Running Out of Food in the Last Year: Never true    Ran Out of Food in the Last Year: Never true  Transportation Needs: No Transportation Needs (08/08/2023)   PRAPARE - Administrator, Civil Service (Medical): No    Lack of Transportation (Non-Medical): No  Physical Activity: Sufficiently Active (08/08/2023)   Exercise Vital Sign    Days of Exercise per Week: 4 days    Minutes of Exercise per Session: 50 min  Stress: No Stress Concern Present (08/08/2023)   [employer] of Occupational Health - Occupational Stress Questionnaire    Feeling of Stress : Not at all  Social Connections: Socially Integrated (06/27/2023)   Social Connection and Isolation Panel [NHANES]    Frequency of Communication with Friends and Family: More than three times a week    Frequency of Social  Gatherings with Friends and Family: Twice a week    Attends Religious Services: More than 4 times per year    Active Member of [employer] or Organizations: Yes    Attends Banker Meetings: More than 4 times per year    Marital Status: Married  Catering manager Violence: Not At Risk (08/08/2023)   Humiliation, Afraid,  Rape, and Kick questionnaire    Fear of Current or Ex-Partner: No    Emotionally Abused: No    Physically Abused: No    Sexually Abused: No    Outpatient Medications Prior to Visit  Medication Sig Dispense Refill   acetaminophen  (TYLENOL ) 325 MG tablet Take 1-2 tablets (325-650 mg total) by mouth every 8 (eight) hours as needed for mild pain.     albuterol  (VENTOLIN  HFA) 108 (90 Base) MCG/ACT inhaler INHALE 2 PUFFS INTO THE LUNGS EVERY 6 HOURS AS NEEDED FOR WHEEZING OR SHORTNESS OF BREATH 54 g 0   amLODipine  (NORVASC ) 10 MG tablet Take 1 tablet (10 mg total) by mouth daily. 90 tablet 1   buPROPion  (WELLBUTRIN  XL) 150 MG 24 hr tablet Take 1 tablet (150 mg total) by mouth daily. (Patient taking differently: Take 150 mg by mouth every evening.) 90 tablet 3   Calcium -Cholecalciferol (QC CALCIUM  500MG -D3) 500-5 MG-MCG TABS Take 500 mg by mouth daily.     Cholecalciferol (VITAMIN D3 ADULT GUMMIES) 25 MCG (1000 UT) CHEW Chew 2 each by mouth daily.     guaiFENesin  (MUCINEX ) 600 MG 12 hr tablet Take 600 mg by mouth daily.     ibuprofen (ADVIL) 200 MG tablet Take 200 mg by mouth daily as needed for mild pain (pain score 1-3).     levothyroxine  (SYNTHROID ) 125 MCG tablet Take 1 tablet (125 mcg total) by mouth daily before breakfast. 90 tablet 0   nitroGLYCERIN  (NITROSTAT ) 0.4 MG SL tablet Place 1 tablet (0.4 mg total) under the tongue every 5 (five) minutes x 3 doses as needed for chest pain. 25 tablet 3   pantoprazole  (PROTONIX ) 40 MG tablet TAKE 1 TABLET(40 MG) BY MOUTH DAILY 30 tablet 2   senna-docusate (SENOKOT-S) 8.6-50 MG tablet Take 2 tablets by mouth at bedtime. (Patient  taking differently: Take 2 tablets by mouth at bedtime as needed for mild constipation.) 60 tablet 0   rosuvastatin  (CRESTOR ) 20 MG tablet Take 1 tablet (20 mg total) by mouth daily. (Patient not taking: Reported on 08/08/2023) 90 tablet 3   No facility-administered medications prior to visit.    Allergies  Allergen Reactions   Phenergan  [Promethazine  Hcl] Other (See Comments)    Jerking/agitation   Preservision Areds 2 [Multiple Vitamins-Minerals] Nausea Only   Gabapentin  Other (See Comments)   Lidocaine -Epinephrine  Other (See Comments)    1% lidocaine  with epinephrine ----CHEST PAIN--NSTEMI   Levaquin  [Levofloxacin ] Nausea And Vomiting   Pravastatin Nausea And Vomiting    ROS See HPI    Objective:     Physical Exam  General: No acute distress. Awake and conversant.  Eyes: Normal conjunctiva, anicteric. Round symmetric pupils.  Neck: No JVD Respiratory: CTAB Respirations are non-labored. No wheezing.  Skin: Warm, Dry +left foot flakey, erythematous, skin irritation Psych: Alert and oriented. Cooperative, Appropriate mood and affect, Normal judgment.  CV: RRR. No murmur. +3 edema bilateral LE. MSK: Normal ambulation. No clubbing or cyanosis.  Neuro: Sensation and CN II-XII grossly normal.    BP 130/68 (BP Location: Left Arm, Patient Position: Sitting, Cuff Size: Large)   Pulse 63   Temp 98 F (36.7 C) (Oral)   Resp 12   Ht 5\' 1"  (1.549 m)   Wt 152 lb (68.9 kg)   SpO2 94%   BMI 28.72 kg/m  Wt Readings from Last 3 Encounters:  08/08/23 152 lb (68.9 kg)  08/08/23 152 lb (68.9 kg)  07/08/23 148 lb (67.1 kg)       Assessment &  Plan:   Problem List Items Addressed This Visit     Bilateral swelling of feet and ankles   No signs of DVT or cellulitis.  Compression therapy and leg elevation advised.  Patient is scheduled for echocardiogram next week.  States she was out of her thyroid  medicine this week, this may have contributed to the swelling.  Will  check BNP, CMP,  TSH today.  RTC to clinic if  worsening or persistent symptoms.      Relevant Orders   Comprehensive metabolic panel with GFR   TSH   B Nat Peptide   Skin irritation - Primary    triamcinolone   0.1% cream BID.  if no improvement return to clinic to reassess.      Relevant Medications   triamcinolone  cream (KENALOG) 0.1 %    I am having Vanette C. Commins start on triamcinolone  cream. I am also having her maintain her senna-docusate, acetaminophen , Vitamin D3 Adult Gummies, albuterol , guaiFENesin , ibuprofen, QC Calcium  500mg -D3, buPROPion , pantoprazole , amLODipine , nitroGLYCERIN , rosuvastatin , and levothyroxine .  Meds ordered this encounter  Medications   triamcinolone  cream (KENALOG) 0.1 %    Sig: Apply 1 Application topically 2 (two) times daily.    Dispense:  30 g    Refill:  0    Supervising Provider:   Randie Bustle A [[id number]]

## 2023-08-08 NOTE — Patient Instructions (Signed)
 Amanda [name] , Thank you for taking time out of your busy schedule to complete your Annual Wellness Visit with me. I enjoyed our conversation and look forward to speaking with you again next year. I, as well as your care team,  appreciate your ongoing commitment to your health goals. Please review the following plan we discussed and let me know if I can assist you in the future. Your Game plan/ To Do List    Follow up Visits: Next Medicare AWV with our clinical staff: 08/13/24 1:00 in person   Next Office Visit with your provider: 02/10/24/ 1:20  Clinician Recommendations:  Aim for 30 minutes of exercise or brisk walking, 6-8 glasses of water , and 5 servings of fruits and vegetables each day.       This is a list of the screening recommended for you and due dates:  Health Maintenance  Topic Date Due   COVID-19 Vaccine (4 - 2024-25 season) 11/03/2022   Medicare Annual Wellness Visit  07/16/2023   Zoster (Shingles) Vaccine (1 of 2) 08/07/2024*   Flu Shot  10/03/2023   DEXA scan (bone density measurement)  06/09/2024   DTaP/Tdap/Td vaccine (3 - Td or Tdap) 12/11/2028   Pneumonia Vaccine  Completed   HPV Vaccine  Aged Out   Meningitis B Vaccine  Aged Out  *Topic was postponed. The date shown is not the original due date.      See attachments for Healthy eating plan.

## 2023-08-08 NOTE — Progress Notes (Signed)
 08/13/24 Please attest this visit in the absence of patient primary care provider.    Subjective:   Amanda [name] is a 83 y.o. who presents for a Medicare Wellness preventive visit.  As a reminder, Annual Wellness Visits don't include a physical exam, and some assessments may be limited, especially if this visit is performed virtually. We may recommend an in-person follow-up visit with your provider if needed.  Visit Complete: In person  VideoDeclined- This patient declined Interactive audio and Acupuncturist. Therefore the visit was completed with audio only.  Persons Participating in Visit: Patient.  AWV Questionnaire: No: Patient Medicare AWV questionnaire was not completed prior to this visit.  Cardiac Risk Factors include: dyslipidemia;hypertension     Objective:     Today's Vitals   08/08/23 1342  Weight: 152 lb (68.9 kg)  Height: 5\' 1"  (1.549 m)   Body mass index is 28.72 kg/m.     08/08/2023    1:48 PM 06/26/2023   12:28 PM 10/23/2022    6:27 PM 07/16/2022    4:22 PM 07/11/2022    3:44 PM 02/19/2022    2:27 PM 10/18/2021    1:06 PM  Advanced Directives  Does Patient Have a Medical Advance Directive? Yes No No Yes Yes Yes Yes  Type of Estate agent of [place];Living will   Healthcare Power of [place];Living will Healthcare Power of [place];Living will Living will;Healthcare Power of [employer] Power of [place];Living will  Does patient want to make changes to medical advance directive? No - Patient declined   No - Patient declined  No - Patient declined No - Patient declined  Copy of Healthcare Power of Attorney in Chart? Yes - validated most recent copy scanned in chart (See row information)   Yes - validated most recent copy scanned in chart (See row information)   Yes - validated most recent copy scanned in chart (See row information)  Would patient like information on creating a medical advance directive?   No - Patient  declined        Current Medications (verified) Outpatient Encounter Medications as of 08/08/2023  Medication Sig   acetaminophen  (TYLENOL ) 325 MG tablet Take 1-2 tablets (325-650 mg total) by mouth every 8 (eight) hours as needed for mild pain.   albuterol  (VENTOLIN  HFA) 108 (90 Base) MCG/ACT inhaler INHALE 2 PUFFS INTO THE LUNGS EVERY 6 HOURS AS NEEDED FOR WHEEZING OR SHORTNESS OF BREATH   amLODipine  (NORVASC ) 10 MG tablet Take 1 tablet (10 mg total) by mouth daily.   buPROPion  (WELLBUTRIN  XL) 150 MG 24 hr tablet Take 1 tablet (150 mg total) by mouth daily. (Patient taking differently: Take 150 mg by mouth every evening.)   Calcium -Cholecalciferol (QC CALCIUM  500MG -D3) 500-5 MG-MCG TABS Take 500 mg by mouth daily.   Cholecalciferol (VITAMIN D3 ADULT GUMMIES) 25 MCG (1000 UT) CHEW Chew 2 each by mouth daily.   guaiFENesin  (MUCINEX ) 600 MG 12 hr tablet Take 600 mg by mouth daily.   ibuprofen (ADVIL) 200 MG tablet Take 200 mg by mouth daily as needed for mild pain (pain score 1-3).   levothyroxine  (SYNTHROID ) 125 MCG tablet Take 1 tablet (125 mcg total) by mouth daily before breakfast.   nitroGLYCERIN  (NITROSTAT ) 0.4 MG SL tablet Place 1 tablet (0.4 mg total) under the tongue every 5 (five) minutes x 3 doses as needed for chest pain.   pantoprazole  (PROTONIX ) 40 MG tablet TAKE 1 TABLET(40 MG) BY MOUTH DAILY   senna-docusate (SENOKOT-S) 8.6-50 MG tablet  Take 2 tablets by mouth at bedtime. (Patient taking differently: Take 2 tablets by mouth at bedtime as needed for mild constipation.)   rosuvastatin  (CRESTOR ) 20 MG tablet Take 1 tablet (20 mg total) by mouth daily. (Patient not taking: Reported on 08/08/2023)   [DISCONTINUED] amoxicillin -clavulanate (AUGMENTIN ) 875-125 MG tablet Take 1 tablet by mouth 2 (two) times daily. (Patient not taking: Reported on 08/08/2023)   No facility-administered encounter medications on file as of 08/08/2023.    Allergies (verified) Phenergan  [promethazine  hcl],  Preservision areds 2 [multiple vitamins-minerals], Gabapentin , Lidocaine -epinephrine , Levaquin  [levofloxacin ], and Pravastatin   History: Past Medical History:  Diagnosis Date   Anemia    Arthritis    Bronchiectasis (HCC)    Cancer (HCC)    Cervicalgia    Constipation, chronic    Essential hypertension    GERD (gastroesophageal reflux disease)    zantac    Heart murmur    History of blood transfusion 1959   [place]   Hyperlipidemia    Hypertension    Hyperthyroidism    Hypothyroid    Hypothyroidism    Iron deficiency anemia 04/20/2021   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   Lumbar burst fracture (HCC)    Lymphoproliferative disorder (HCC)    Macular degeneration 2013   Both eyes    Macular degeneration, bilateral    Marginal zone lymphoma (HCC)    Osteopenia    Pneumonia    Pneumonia due to COVID-19 virus 2021   Required hospitalization   PONV (postoperative nausea and vomiting)    needs little anesthesia   Shingles    Shortness of breath    on exertion   Spleen enlarged    SUI (stress urinary incontinence, female)    Urinary, incontinence, stress female    Wears glasses    Past Surgical History:  Procedure Laterality Date   BREAST EXCISIONAL BIOPSY Left 1980   CARPAL TUNNEL RELEASE  1999   CATARACT EXTRACTION  2009, 2011   BOTH EYES   CATARACT EXTRACTION, BILATERAL     CESAREAN SECTION  1959   CESAREAN SECTION     COLONOSCOPY      Dr Dellis Fermo   DILATION AND CURETTAGE OF UTERUS     X2   EYE SURGERY  Cataracts removed   FRACTURE SURGERY  12/12/2018   Left ankle plates   HYSTEROSCOPY WITH D & C  01/07/2012   Procedure: DILATATION AND CURETTAGE /HYSTEROSCOPY;  Surgeon: Davia Erps, MD;  Location: [facility];  Service: Gynecology;  Laterality: N/A;  intrauterine foley catheter for tamponode    IR IMAGING GUIDED PORT INSERTION  07/15/2018   IR REMOVAL TUN ACCESS W/ PORT W/O FL MOD SED  04/11/2021   LEFT HEART CATH AND CORONARY ANGIOGRAPHY N/A 06/27/2023    Procedure: LEFT HEART CATH AND CORONARY ANGIOGRAPHY;  Surgeon: Odie Benne, MD;  Location: [facility];  Service: Cardiovascular;  Laterality: N/A;   LYMPH NODE BIOPSY Left 05/26/2018   Procedure: LEFT AXILLARY LYMPH NODE BIOPSY;  Surgeon: Boyce Byes, MD;  Location: [facility] OR;  Service: General;  Laterality: Left;   ORIF ANKLE FRACTURE Left 12/12/2018   ORIF ANKLE FRACTURE Left 12/12/2018   Procedure: OPEN REDUCTION INTERNAL FIXATION (ORIF) ANKLE FRACTURE;  Surgeon: Jasmine Mesi, MD;  Location: [facility] OR;  Service: Orthopedics;  Laterality: Left;   ORIF ANKLE FRACTURE Left 12/2018   TONSILLECTOMY     TONSILLECTOMY AND ADENOIDECTOMY     TUBAL LIGATION     BY LAPAROSCOPY   WISDOM TOOTH  EXTRACTION     Family History  Problem Relation Age of Onset   Ovarian cancer Mother    Breast cancer Mother 67   Hypertension Father    Prostate cancer Father    Kidney failure Father    Diabetes Father    Kidney disease Father    Hyperlipidemia Brother    COPD Paternal Grandfather    Stroke Maternal Grandfather    Hypercalcemia Neg Hx    Social History   Socioeconomic History   Marital status: Married    Spouse name: Not on file   Number of children: Not on file   Years of education: Not on file   Highest education level: Not on file  Occupational History   Not on file  Tobacco Use   Smoking status: Never   Smokeless tobacco: Never  Vaping Use   Vaping status: Never Used  Substance and Sexual Activity   Alcohol use: Yes    Alcohol/week: 1.0 standard drink of alcohol    Types: 1 Glasses of wine per week    Comment: RARE   Drug use: Never   Sexual activity: Not Currently    Birth control/protection: Post-menopausal  Other Topics Concern   Not on file  Social History Narrative   ** Merged History Encounter **       ** Merged History Encounter **       Social Drivers of Corporate investment banker Strain: Low Risk  (08/08/2023)   Overall Financial Resource  Strain (CARDIA)    Difficulty of Paying Living Expenses: Not very hard  Food Insecurity: No Food Insecurity (08/08/2023)   Hunger Vital Sign    Worried About Running Out of Food in the Last Year: Never true    Ran Out of Food in the Last Year: Never true  Transportation Needs: No Transportation Needs (08/08/2023)   PRAPARE - Administrator, Civil Service (Medical): No    Lack of Transportation (Non-Medical): No  Physical Activity: Sufficiently Active (08/08/2023)   Exercise Vital Sign    Days of Exercise per Week: 4 days    Minutes of Exercise per Session: 50 min  Stress: No Stress Concern Present (08/08/2023)   [employer] of Occupational Health - Occupational Stress Questionnaire    Feeling of Stress : Not at all  Social Connections: Socially Integrated (06/27/2023)   Social Connection and Isolation Panel [NHANES]    Frequency of Communication with Friends and Family: More than three times a week    Frequency of Social Gatherings with Friends and Family: Twice a week    Attends Religious Services: More than 4 times per year    Active Member of [employer] or Organizations: Yes    Attends Engineer, structural: More than 4 times per year    Marital Status: Married    Tobacco Counseling Counseling given: Not Answered    Clinical Intake:  Pre-visit preparation completed: Yes  Pain : No/denies pain     BMI - recorded: 28.72 Nutritional Risks: None Diabetes: No  Lab Results  Component Value Date   HGBA1C 6.1 (H) 11/18/2014   HGBA1C 5.8 11/29/2011   HGBA1C 5.7 01/05/2009        Interpreter Needed?: No  Information entered by :: Susa Engman, CMA   Activities of Daily Living     08/08/2023    1:30 PM 06/27/2023    1:00 PM  In your present state of health, do you have any difficulty performing the following activities:  Hearing? 0 1  Vision? 1 0  Comment macular degeneration, sees specialist / [facility]   Difficulty concentrating or making  decisions? 0 0  Walking or climbing stairs? 0   Dressing or bathing? 0   Doing errands, shopping? 0   Preparing Food and eating ? N   Using the Toilet? N   In the past six months, have you accidently leaked urine? N   Do you have problems with loss of bowel control? Y   Comment sees specialist   Managing your Medications? N   Managing your Finances? N   Housekeeping or managing your Housekeeping? N     Patient Care Team: Crecencio Dodge, Candida Chalk, DO as PCP - General (Family Medicine) Maximo Spar Aviva Lemmings, MD as PCP - Cardiology (Cardiology) Thais Fill, MD as Consulting Physician (Dermatology) Amedeo Jupiter, MD as Consulting Physician (Ophthalmology) Davia Erps, MD (Inactive) as Consulting Physician (Gynecology) Ivor Mars, MD as Medical Oncologist (Oncology) Crecencio Dodge, Candida Chalk, DO (Family Medicine) Rawland Caddy, MD as Consulting Physician (Physical Medicine and Rehabilitation) Ali Antonio Brandy Cal, MD as Consulting Physician (Neurosurgery) Cecilie Coffee, RPH-CPP (Pharmacist)  I have updated your Care Teams any recent Medical Services you may have received from other providers in the past year.     Assessment:    This is a routine wellness examination for [place].  Hearing/Vision screen Hearing Screening - Comments:: Denies difficulty Vision Screening - Comments:: Has macular degeneration. Sees [facility] for eye exams.   Goals Addressed   None    Depression Screen     08/08/2023    1:26 PM 07/16/2022    3:52 PM 05/14/2022    2:10 PM 06/12/2021   12:43 PM 04/17/2021    2:33 PM 02/09/2021    3:19 PM 06/21/2020    2:22 PM  PHQ 2/9 Scores  PHQ - 2 Score 0 0 0 0 0 0 0    Fall Risk     08/08/2023    1:20 PM 07/16/2022   12:00 PM 05/14/2022    2:09 PM 06/12/2021   12:40 PM 04/17/2021    2:33 PM  Fall Risk   Falls in the past year? 0 1 1 1  0  Number falls in past yr: 0 0 0 1 0  Injury with Fall? 0 1 1 0 0  Risk for fall due to : History of fall(s) History of  fall(s) History of fall(s);Impaired mobility History of fall(s) No Fall Risks  Follow up Falls evaluation completed Falls evaluation completed Falls evaluation completed Falls prevention discussed Falls evaluation completed    MEDICARE RISK AT HOME:  Medicare Risk at Home Any stairs in or around the home?: No (lives in assisted living facility) If so, are there any without handrails?: No Home free of loose throw rugs in walkways, pet beds, electrical cords, etc?: Yes Adequate lighting in your home to reduce risk of falls?: Yes Life alert?: No Use of a cane, walker or w/c?: No Grab bars in the bathroom?: Yes Shower chair or bench in shower?: Yes Elevated toilet seat or a handicapped toilet?: Yes  TIMED UP AND GO:  Was the test performed?  Yes  Length of time to ambulate 10 feet: 7 sec Gait slow and steady without use of assistive device  Cognitive Function: 6CIT completed    08/29/2016   10:28 AM  MMSE - Mini Mental State Exam  Orientation to time 5  Orientation to Place 5  Registration 3  Attention/ Calculation  5  Recall 3  Language- name 2 objects 2  Language- repeat 1  Language- follow 3 step command 3  Language- read & follow direction 1  Write a sentence 1  Copy design 1  Total score 30        08/08/2023    1:32 PM 07/16/2022    4:08 PM  6CIT Screen  What Year?  0 points  What month? 3 points 0 points  What time? 0 points 0 points  Count back from 20 0 points 0 points  Months in reverse 0 points 0 points  Repeat phrase 2 points 0 points  Total Score  0 points    Immunizations Immunization History  Administered Date(s) Administered   Fluad Quad(high Dose 65+) 12/16/2018, 12/07/2019, 01/04/2021   Influenza Whole 03/04/2001, 01/12/2009, 04/04/2010   Influenza, High Dose Seasonal PF 01/20/2014, 12/05/2014, 11/21/2015, 11/05/2016, 02/03/2018   Influenza,inj,Quad PF,6+ Mos 01/21/2013   Influenza-Unspecified 10/30/2017, 01/11/2022   PFIZER(Purple Top)SARS-COV-2  Vaccination 03/25/2019, 04/15/2019, 03/10/2020   Pneumococcal Conjugate-13 01/20/2014   Pneumococcal Polysaccharide-23 07/10/2012   Td 09/01/1997   Tdap 12/12/2018    Screening Tests Health Maintenance  Topic Date Due   COVID-19 Vaccine (4 - 2024-25 season) 11/03/2022   Medicare Annual Wellness (AWV)  07/16/2023   Zoster Vaccines- Shingrix (1 of 2) 08/07/2024 (Originally 12/25/1959)   INFLUENZA VACCINE  10/03/2023   DEXA SCAN  06/09/2024   DTaP/Tdap/Td (3 - Td or Tdap) 12/11/2028   Pneumonia Vaccine 40+ Years old  Completed   HPV VACCINES  Aged Out   Meningococcal B Vaccine  Aged Out    Health Maintenance  Health Maintenance Due  Topic Date Due   COVID-19 Vaccine (4 - 2024-25 season) 11/03/2022   Medicare Annual Wellness (AWV)  07/16/2023   Health Maintenance Items Addressed: Pt knows she can get COVID booster at local pharmacy if she decides to proceed with more.  All other HM up to date.  Additional Screening:  Vision Screening: Recommended annual ophthalmology exams for early detection of glaucoma and other disorders of the eye. Would you like a referral to an eye doctor? No    Dental Screening: Recommended annual dental exams for proper oral hygiene  Community Resource Referral / Chronic Care Management: CRR required this visit?  No   CCM required this visit?  No   Plan:    I have personally reviewed and noted the following in the patient's chart:   Medical and social history Use of alcohol, tobacco or illicit drugs  Current medications and supplements including opioid prescriptions. Patient is not currently taking opioid prescriptions. Functional ability and status Nutritional status Physical activity Advanced directives List of other physicians Hospitalizations, surgeries, and ER visits in previous 12 months Vitals Screenings to include cognitive, depression, and falls Referrals and appointments  In addition, I have reviewed and discussed with  patient certain preventive protocols, quality metrics, and best practice recommendations. A written personalized care plan for preventive services as well as general preventive health recommendations were provided to patient.   Susa Engman, CMA   08/08/2023   After Visit Summary: (In Person-Printed) AVS printed and given to the patient  Notes: Nothing significant to report at this time.

## 2023-08-08 NOTE — Assessment & Plan Note (Signed)
 No signs of DVT or cellulitis.  Compression therapy and leg elevation advised.  Patient is scheduled for echocardiogram next week.  States she was out of her thyroid  medicine this week, this may have contributed to the swelling.  Will  check BNP, CMP, TSH today.  RTC to clinic if  worsening or persistent symptoms.

## 2023-08-09 LAB — COMPREHENSIVE METABOLIC PANEL WITH GFR
AG Ratio: 2.2 (calc) (ref 1.0–2.5)
ALT: 13 U/L (ref 6–29)
AST: 16 U/L (ref 10–35)
Albumin: 4.4 g/dL (ref 3.6–5.1)
Alkaline phosphatase (APISO): 94 U/L (ref 37–153)
BUN/Creatinine Ratio: 23 (calc) — ABNORMAL HIGH (ref 6–22)
BUN: 25 mg/dL (ref 7–25)
CO2: 25 mmol/L (ref 20–32)
Calcium: 9.2 mg/dL (ref 8.6–10.4)
Chloride: 104 mmol/L (ref 98–110)
Creat: 1.08 mg/dL — ABNORMAL HIGH (ref 0.60–0.95)
Globulin: 2 g/dL (ref 1.9–3.7)
Glucose, Bld: 84 mg/dL (ref 65–99)
Potassium: 4.7 mmol/L (ref 3.5–5.3)
Sodium: 140 mmol/L (ref 135–146)
Total Bilirubin: 0.3 mg/dL (ref 0.2–1.2)
Total Protein: 6.4 g/dL (ref 6.1–8.1)
eGFR: 51 mL/min/{1.73_m2} — ABNORMAL LOW (ref >=60)

## 2023-08-09 LAB — BRAIN NATRIURETIC PEPTIDE: Brain Natriuretic Peptide: 142 pg/mL — ABNORMAL HIGH (ref <100)

## 2023-08-09 LAB — TSH: TSH: 3.22 m[IU]/L (ref 0.40–4.50)

## 2023-08-09 LAB — SPECIMEN COMPROMISED

## 2023-08-11 ENCOUNTER — Telehealth: Payer: Self-pay | Admitting: *Deleted

## 2023-08-11 NOTE — Telephone Encounter (Signed)
 My chart message sent to pt.

## 2023-08-12 DIAGNOSIS — M542 Cervicalgia: Secondary | ICD-10-CM | POA: Diagnosis not present

## 2023-08-12 DIAGNOSIS — M545 Low back pain, unspecified: Secondary | ICD-10-CM | POA: Diagnosis not present

## 2023-08-14 DIAGNOSIS — M542 Cervicalgia: Secondary | ICD-10-CM | POA: Diagnosis not present

## 2023-08-14 DIAGNOSIS — M545 Low back pain, unspecified: Secondary | ICD-10-CM | POA: Diagnosis not present

## 2023-08-18 ENCOUNTER — Encounter: Payer: Self-pay | Admitting: Internal Medicine

## 2023-08-19 ENCOUNTER — Ambulatory Visit: Admitting: Cardiology

## 2023-08-20 ENCOUNTER — Other Ambulatory Visit: Payer: Self-pay

## 2023-08-20 MED ORDER — PANTOPRAZOLE SODIUM 40 MG PO TBEC: 40.0000 mg | DELAYED_RELEASE_TABLET | Freq: Every day | ORAL | 1 refills | Status: AC

## 2023-08-26 DIAGNOSIS — M545 Low back pain, unspecified: Secondary | ICD-10-CM | POA: Diagnosis not present

## 2023-08-26 DIAGNOSIS — M542 Cervicalgia: Secondary | ICD-10-CM | POA: Diagnosis not present

## 2023-08-28 DIAGNOSIS — M542 Cervicalgia: Secondary | ICD-10-CM | POA: Diagnosis not present

## 2023-08-28 DIAGNOSIS — M545 Low back pain, unspecified: Secondary | ICD-10-CM | POA: Diagnosis not present

## 2023-08-29 ENCOUNTER — Telehealth: Payer: Self-pay | Admitting: Physical Medicine and Rehabilitation

## 2023-08-29 NOTE — Telephone Encounter (Signed)
 Patient called and said she has been approved for the procedure and ready to get scheduled. CB#[phone]

## 2023-09-01 ENCOUNTER — Encounter: Payer: Self-pay | Admitting: Hematology & Oncology

## 2023-09-01 ENCOUNTER — Telehealth: Payer: Self-pay | Admitting: Physical Medicine and Rehabilitation

## 2023-09-01 ENCOUNTER — Ambulatory Visit (HOSPITAL_COMMUNITY)
Admission: RE | Admit: 2023-09-01 | Discharge: 2023-09-01 | Disposition: A | Discharge status: Home / Self Care | Source: Ambulatory Visit | Attending: Cardiovascular Disease | Admitting: Cardiovascular Disease

## 2023-09-01 DIAGNOSIS — I214 Non-ST elevation (NSTEMI) myocardial infarction: Secondary | ICD-10-CM

## 2023-09-01 LAB — ECHOCARDIOGRAM COMPLETE
Area-P 1/2: 3.56 cm2
S' Lateral: 2.55 cm

## 2023-09-01 NOTE — Telephone Encounter (Signed)
 Pt called stating she has an upcoming appt with [facility] and need for someone to call her\. She has medical questions. Please cll pt at  [phone].

## 2023-09-02 DIAGNOSIS — M542 Cervicalgia: Secondary | ICD-10-CM | POA: Diagnosis not present

## 2023-09-02 DIAGNOSIS — M545 Low back pain, unspecified: Secondary | ICD-10-CM | POA: Diagnosis not present

## 2023-09-04 DIAGNOSIS — M545 Low back pain, unspecified: Secondary | ICD-10-CM | POA: Diagnosis not present

## 2023-09-04 DIAGNOSIS — M542 Cervicalgia: Secondary | ICD-10-CM | POA: Diagnosis not present

## 2023-09-08 ENCOUNTER — Ambulatory Visit: Payer: Self-pay | Admitting: Family Medicine

## 2023-09-09 DIAGNOSIS — M542 Cervicalgia: Secondary | ICD-10-CM | POA: Diagnosis not present

## 2023-09-09 DIAGNOSIS — M545 Low back pain, unspecified: Secondary | ICD-10-CM | POA: Diagnosis not present

## 2023-09-11 DIAGNOSIS — M542 Cervicalgia: Secondary | ICD-10-CM | POA: Diagnosis not present

## 2023-09-11 DIAGNOSIS — M545 Low back pain, unspecified: Secondary | ICD-10-CM | POA: Diagnosis not present

## 2023-09-15 ENCOUNTER — Encounter: Payer: Self-pay | Admitting: Physician Assistant

## 2023-09-15 ENCOUNTER — Ambulatory Visit: Attending: Physician Assistant | Admitting: Cardiology

## 2023-09-15 VITALS — BP 104/62 | HR 68 | Ht 60.0 in | Wt 149.0 lb

## 2023-09-15 DIAGNOSIS — I214 Non-ST elevation (NSTEMI) myocardial infarction: Secondary | ICD-10-CM | POA: Diagnosis not present

## 2023-09-15 DIAGNOSIS — I10 Essential (primary) hypertension: Secondary | ICD-10-CM | POA: Diagnosis not present

## 2023-09-15 NOTE — Progress Notes (Signed)
 Cardiology Office Note:  .   Date:  09/15/2023  ID:  Dagoberto JAYSON Null, DOB 05-Aug-1940, MRN [id number] PCP: Antonio Meth, Jamee SAUNDERS, DO  [facility] Providers Cardiologist:  Vinie JAYSON Maxcy, MD {  History of Present Illness: .   SELINA TAPPER is a 83 y.o. female with history of hypertension, hyperlipidemia, hypothyroidism, incontinence.     NSTEMI No prior cardiac history.   Admitted 06/2023 due to acute onset of chest pain/pinching pressure, with radiation to both arms after getting lidocaine  outpatient for urinary incontinence procedure.  Troponins 4100+.  LHC with minimal CAD.  Possible vasospasm, also noted luminal irregularities on cath.  Although no ST-T wave changes are noted.  Normal echocardiogram.  Social history  Does not smoke or drink.  Not a known diabetic.     Patient with history of NSTEMI 06/2023 with minimal evidence of CAD.  No evidence of Takotsubo.  Thought to be related to vasospasm potentially.  They stopped beta-blocker and rosuvastatin  was increased from 5 to 20 mg.  Patient presents today posthospitalization although slightly delayed.  She reports no further episodes of chest pain.  Reports she is very active and can walk several blocks without any shortness of breath.  She says her back pain is a limiting factor and has plans for medial branch block injection.  She reports that she had stopped her rosuvastatin  due to side effects although does not remember exactly what these were.  Also notes cough but no fever, chills, productive cough, breathing issues.  Does have known history of community-acquired pneumonia.  ROS: Denies: Chest pain, shortness of breath, orthopnea, peripheral edema, palpitations, decreased exercise intolerance, fatigue, lightheadedness.   Studies Reviewed: SABRA    EKG Interpretation Date/Time:  Monday September 15 2023 14:03:01 EDT Ventricular Rate:  68 PR Interval:  182 QRS Duration:  84 QT Interval:  400 QTC Calculation: 425 R  Axis:   -38  Text Interpretation: Normal sinus rhythm Left axis deviation When compared with ECG of 26-Jun-2023 12:30, No significant change was found Confirmed by Darryle Currier [phone]) on 09/15/2023 2:06:35 PM    Risk Assessment/Calculations:             Physical Exam:   VS:  BP 104/62   Pulse 68   Ht 5' (1.524 m)   Wt 149 lb (67.6 kg)   SpO2 97%   BMI 29.10 kg/m    Wt Readings from Last 3 Encounters:  09/15/23 149 lb (67.6 kg)  08/08/23 152 lb (68.9 kg)  08/08/23 152 lb (68.9 kg)    GEN: Well nourished, well developed in no acute distress NECK: No JVD; No carotid bruits CARDIAC: RRR, no murmurs, rubs, gallops RESPIRATORY: Crackles in the lower bases ABDOMEN: Soft, non-tender, non-distended EXTREMITIES:  No edema; No deformity   ASSESSMENT AND PLAN: .     NSTEMI Episode of chest pain preceded by lidocaine  injection during outpatient urinary procedure.  Left heart catheterization 06/2023 with minimal CAD, possible vasospasm.  No further issues of chest pain.  EKG without any ischemic changes. Continue amlodipine  10 mg  Hypertension Excellent control today 104/62. Continue with amlodipine  10 mg daily  Hyperlipidemia LDL is 110 April 2025. Patient self discontinued rosuvastatin  citing that she had side effects from this, though was unable to remember exactly what this was.  She has no evidence of CAD and with no history of stroke or diabetes or other risk factors.  She is 83 years old.  Discussed risk and benefits and she  has elected to discontinue.  Bilateral L4-5 and L5-S1 Lumbar facet/medial branch block with fluoroscopic guidance  She reports she is having another procedure for lidocaine  injection/ablation with orthopedics in the next 1 to 2 weeks.  She does not have any cardiac history that would suggest this is unreasonable to continue nor does she need any further workup.  Unfortunately not aware of any way to prevent medication induced vasospasm if that is truly what  happened, would defer to orthopedics to look for other options if available.  Regardless we know she does not have any evidence of obstructive CAD or heart failure.  History of CAP She has been reporting coughing for last couple weeks, lungs sound like they may have some consolidation/congestion.  Given her history recommended that she follow-up with her PCP to consider chest x-ray/and further management  Dispo: 3-month follow-up with Dr. Mona  Signed, Thom LITTIE Sluder, PA-C

## 2023-09-15 NOTE — Patient Instructions (Signed)
 Medication Instructions:  STOP ROSUVASTATIN   *If you need a refill on your cardiac medications before your next appointment, please call your pharmacy*  Lab Work: NO LABS If you have labs (blood work) drawn today and your tests are completely normal, you will receive your results only by: MyChart Message (if you have MyChart) OR A paper copy in the mail If you have any lab test that is abnormal or we need to change your treatment, we will call you to review the results.  Testing/Procedures: NO TESTING  Follow-Up: At [facility], you and your health needs are our priority.  As part of our continuing mission to provide you with exceptional heart care, our providers are all part of one team.  This team includes your primary Cardiologist (physician) and Advanced Practice Providers or APPs (Physician Assistants and Nurse Practitioners) who all work together to provide you with the care you need, when you need it.  Your next appointment:   6 month(s)  Provider:   Vinie JAYSON Maxcy, MD

## 2023-09-16 ENCOUNTER — Telehealth: Payer: Self-pay | Admitting: Family Medicine

## 2023-09-16 DIAGNOSIS — M545 Low back pain, unspecified: Secondary | ICD-10-CM | POA: Diagnosis not present

## 2023-09-16 DIAGNOSIS — M542 Cervicalgia: Secondary | ICD-10-CM | POA: Diagnosis not present

## 2023-09-16 NOTE — Telephone Encounter (Signed)
 Just an FYI.  Error CRM created, asked that agent call Pt back, we are needing further information.

## 2023-09-16 NOTE — Telephone Encounter (Signed)
 noted

## 2023-09-16 NOTE — Telephone Encounter (Unsigned)
 Copied from CRM [id number]. Topic: Clinical - Request for Lab/Test Order >> Sep 16, 2023  2:57 PM Gennette ORN wrote: Reason for CRM: Patient  is requesting a Xray order.

## 2023-09-17 ENCOUNTER — Telehealth: Payer: Self-pay

## 2023-09-17 NOTE — Telephone Encounter (Signed)
  Error/Investigation Details: After listening to phone recording, patient was at cardiologist 7/14 and they heard crackling in the chest. Cardiologist suggested PCP order Chest X-ray due to patient having pneumonia several times. Specialist will be coached accordingly.     Channing Yeager, CMA   09/16/2023  3:00 PM  Need further information and/or appt. X-ray for what?

## 2023-09-17 NOTE — Telephone Encounter (Signed)
 Patient called to notify Dr Eldonna that she feels comfortable proceeding with RFA after following up with provider Thom Sluder on 7/14. Dr Eldonna is aware and will discuss with her more at her appt on 7/21.

## 2023-09-18 DIAGNOSIS — M545 Low back pain, unspecified: Secondary | ICD-10-CM | POA: Diagnosis not present

## 2023-09-18 DIAGNOSIS — M542 Cervicalgia: Secondary | ICD-10-CM | POA: Diagnosis not present

## 2023-09-18 NOTE — Telephone Encounter (Signed)
LMOM asking for call back to schedule appt.  

## 2023-09-22 ENCOUNTER — Other Ambulatory Visit: Payer: Self-pay

## 2023-09-22 ENCOUNTER — Ambulatory Visit: Admitting: Physical Medicine and Rehabilitation

## 2023-09-22 VITALS — BP 152/81 | HR 65

## 2023-09-22 DIAGNOSIS — M47816 Spondylosis without myelopathy or radiculopathy, lumbar region: Secondary | ICD-10-CM

## 2023-09-22 DIAGNOSIS — H4423 Degenerative myopia, bilateral: Secondary | ICD-10-CM | POA: Diagnosis not present

## 2023-09-22 DIAGNOSIS — H40013 Open angle with borderline findings, low risk, bilateral: Secondary | ICD-10-CM | POA: Diagnosis not present

## 2023-09-22 DIAGNOSIS — H353132 Nonexudative age-related macular degeneration, bilateral, intermediate dry stage: Secondary | ICD-10-CM | POA: Diagnosis not present

## 2023-09-22 DIAGNOSIS — H18593 Other hereditary corneal dystrophies, bilateral: Secondary | ICD-10-CM | POA: Diagnosis not present

## 2023-09-22 MED ORDER — BUPIVACAINE HCL 0.25 % IJ SOLN
2.0000 mL | Freq: Once | INTRAMUSCULAR | Status: AC
  Administered 2023-09-22: 2 mL

## 2023-09-22 NOTE — Patient Instructions (Signed)

## 2023-09-22 NOTE — Progress Notes (Signed)
 Pain Scale   Average Pain 8 Patient Advising she has lower back pain radiating bilaterally to hip area without relief.        +Driver, -BT, -Dye Allergies.

## 2023-09-23 DIAGNOSIS — M545 Low back pain, unspecified: Secondary | ICD-10-CM | POA: Diagnosis not present

## 2023-09-23 DIAGNOSIS — M542 Cervicalgia: Secondary | ICD-10-CM | POA: Diagnosis not present

## 2023-09-25 DIAGNOSIS — M542 Cervicalgia: Secondary | ICD-10-CM | POA: Diagnosis not present

## 2023-09-25 DIAGNOSIS — M545 Low back pain, unspecified: Secondary | ICD-10-CM | POA: Diagnosis not present

## 2023-09-28 NOTE — Procedures (Signed)
 Lumbar Facet Joint Nerve Denervation  Patient: Amanda [name]      Date of Birth: [birth year] MRN: [id number] PCP: [name] JONELLE, DO      Visit Date: 09/22/2023   Universal Protocol:    Date/Time: 07/27/252:39 PM  Consent Given By: the patient  Position: PRONE  Additional Comments: Vital signs were monitored before and after the procedure. Patient was prepped and draped in the usual sterile fashion. The correct patient, procedure, and site was verified.   Injection Procedure Details:   Procedure diagnoses:  1. Spondylosis without myelopathy or radiculopathy, lumbar region      Meds Administered:  Meds ordered this encounter  Medications   bupivacaine  (MARCAINE ) 0.25 % (with pres) injection 2 mL     Laterality: Right  Location/Site:  L4-L5, L3 and L4 medial branches and L5-S1, L4 medial branch and L5 dorsal ramus  Needle: 18 ga.,  10mm active tip, RF Cannula  Needle Placement: Along juncture of superior articular process and transverse pocess  Findings:  -Comments:  Procedure Details: For each desired target nerve, the corresponding transverse process (sacral ala for the L5 dorsal rami) was identified and the fluoroscope was positioned to square off the endplates of the corresponding vertebral body to achieve a true AP midline view.  The beam was then obliqued 15 to 20 degrees and caudally tilted 15 to 20 degrees to line up a trajectory along the target nerves. The skin over the target of the junction of superior articulating process and transverse process (sacral ala for the L5 dorsal rami) was infiltrated with 1ml of 1% Lidocaine  without Epinephrine .  The 18 gauge 10mm active tip outer cannula was advanced in trajectory view to the target.  This procedure was repeated for each target nerve.  Then, for all levels, the outer cannula placement was fine-tuned and the position was then confirmed with bi-planar imaging.    Test stimulation was done both at  sensory and motor levels to ensure there was no radicular stimulation. The target tissues were then infiltrated with 1 ml of 1% Lidocaine  without Epinephrine . Subsequently, a percutaneous neurotomy was carried out for 90 seconds at 80 degrees Celsius.  After the completion of the lesion, 1 ml of injectate was delivered. It was then repeated for each facet joint nerve mentioned above. Appropriate radiographs were obtained to verify the probe placement during the neurotomy.   Additional Comments:  The patient tolerated the procedure well Dressing: 2 x 2 sterile gauze and Band-Aid    Post-procedure details: Patient was observed during the procedure. Post-procedure instructions were reviewed.  Patient left the clinic in stable condition.

## 2023-09-28 NOTE — Progress Notes (Signed)
 Amanda [name] - 83 y.o. female MRN [id number]  Date of birth: 12/29/1940  Office Visit Note: Visit Date: 09/22/2023 PCP: [name] JONELLE, DO Referred by: [name] JONELLE, *  Subjective: Chief Complaint  Patient presents with   Lower Back - Pain   HPI:  Amanda [name] is a 83 y.o. female who comes in todayfor planned radiofrequency ablation of the Right L4-5 and L5-S1 Lumbar facet joints. This would be ablation of the corresponding medial branches and/or dorsal rami.  Patient has had double diagnostic blocks with more than 50% relief.  These are documented on pain diary.  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  They have no radicular pain.  Their axial pain is worse with standing and ambulating and on exam today with facet loading.  They have had physical therapy as well as home exercise program.  The imaging noted in the chart below indicated facet pathology. Accordingly they meet all the criteria and qualification for for radiofrequency ablation and we are going to complete this today hopefully for more longer term relief as part of comprehensive management program.  After our last medial branch blocks she went on to have a urologic procedure done using lidocaine  jelly as well as nerve block using lidocaine  with epinephrine .  During that nerve block or sometime after that it sounds like she was having chest pain and they did stop the procedure.  She went on to have cardiac cath and ultimately showed that she had no real structural issues and no arterial damage or heart damage.  It was felt like maybe this was vasospasm.  I did speak over the electronic medical record with the PA that saw her in follow-up.  I have spoken with her and her husband today at length.  I think the issue is more with the epinephrine  and the block and maybe some of that was intravascular.  We do use a very small  amount of skin lidocaine  and a very small amount of Marcaine  along the joint for the procedure.  She did elect to proceed with the procedure today.  We do not use any epinephrine  and procedures.     ROS Otherwise per HPI.  Assessment & Plan: Visit Diagnoses:    ICD-10-CM   1. Spondylosis without myelopathy or radiculopathy, lumbar region  M47.816 XR C-ARM NO REPORT    Radiofrequency,Lumbar    bupivacaine  (MARCAINE ) 0.25 % (with pres) injection 2 mL      Plan: No additional findings.   Meds & Orders:  Meds ordered this encounter  Medications   bupivacaine  (MARCAINE ) 0.25 % (with pres) injection 2 mL    Orders Placed This Encounter  Procedures   Radiofrequency,Lumbar   XR C-ARM NO REPORT    Follow-up: Return for visit to requesting provider as needed.   Procedures: No procedures performed  Lumbar Facet Joint Nerve Denervation  Patient: Amanda [name]      Date of Birth: 1940/07/04 MRN: [id number] PCP: [name] JONELLE, DO      Visit Date: 09/22/2023   Universal Protocol:    Date/Time: 07/27/252:39 PM  Consent Given By: the patient  Position: PRONE  Additional Comments: Vital signs were monitored before and after the procedure. Patient was prepped and draped in the usual sterile fashion. The correct patient, procedure, and site was verified.   Injection Procedure Details:  Procedure diagnoses:  1. Spondylosis without myelopathy or radiculopathy, lumbar region      Meds Administered:  Meds ordered this encounter  Medications   bupivacaine  (MARCAINE ) 0.25 % (with pres) injection 2 mL     Laterality: Right  Location/Site:  L4-L5, L3 and L4 medial branches and L5-S1, L4 medial branch and L5 dorsal ramus  Needle: 18 ga.,  10mm active tip, RF Cannula  Needle Placement: Along juncture of superior articular process and transverse pocess  Findings:  -Comments:  Procedure Details: For each desired target nerve, the corresponding transverse  process (sacral ala for the L5 dorsal rami) was identified and the fluoroscope was positioned to square off the endplates of the corresponding vertebral body to achieve a true AP midline view.  The beam was then obliqued 15 to 20 degrees and caudally tilted 15 to 20 degrees to line up a trajectory along the target nerves. The skin over the target of the junction of superior articulating process and transverse process (sacral ala for the L5 dorsal rami) was infiltrated with 1ml of 1% Lidocaine  without Epinephrine .  The 18 gauge 10mm active tip outer cannula was advanced in trajectory view to the target.  This procedure was repeated for each target nerve.  Then, for all levels, the outer cannula placement was fine-tuned and the position was then confirmed with bi-planar imaging.    Test stimulation was done both at sensory and motor levels to ensure there was no radicular stimulation. The target tissues were then infiltrated with 1 ml of 1% Lidocaine  without Epinephrine . Subsequently, a percutaneous neurotomy was carried out for 90 seconds at 80 degrees Celsius.  After the completion of the lesion, 1 ml of injectate was delivered. It was then repeated for each facet joint nerve mentioned above. Appropriate radiographs were obtained to verify the probe placement during the neurotomy.   Additional Comments:  The patient tolerated the procedure well Dressing: 2 x 2 sterile gauze and Band-Aid    Post-procedure details: Patient was observed during the procedure. Post-procedure instructions were reviewed.  Patient left the clinic in stable condition.      Clinical History: CLINICAL DATA:  Low back pain for 6 weeks despite treatment   For the purposes of this dictation, the lowest well-formed intervertebral disc spaces presumed to be the L5-S1 level, and there presumed to be 5 lumbar type vertebral bodies.   EXAM: MRI LUMBAR SPINE WITHOUT CONTRAST   TECHNIQUE: Multiplanar, multisequence MR  imaging of the lumbar spine was performed. No intravenous contrast was administered.   COMPARISON:  None Available.   FINDINGS: For the purposes of this dictation, the lowest well-formed intervertebral disc spaces presumed to be the L5-S1 level, and there presumed to be 5 lumbar type vertebral bodies.   The vertebral bodies are normally aligned with preservation of the normal lumbar lordosis. Vertebral body heights are well preserved. Signal intensity within the vertebral body bone marrow and spinal cord is normal. The conus medullaris terminates at the T12 level.   At L1-2, large central circumferential right paracentral bulging disc with a small right paracentral disc herniation component effacing the ventral anterior epidural space flattening the thecal sac and encroaching the right neural foramina   At L2-3, narrowing of the disc space with a central small disc herniation effacing the ventral anterior epidural space flattening the thecal sac without foraminal encroachment   At L3-4, narrowing of the disc space with a small central circumferential bulging disc flattening the thecal sac with mild bilateral  foraminal encroachment   At L4-5, narrowing of the disc space with a central spondylitic disc herniation effacing the ventral anterior epidural space flattening of the thecal sac with mild bilateral bulging disc extending into the neural foramina with hypertrophic osteoarthritic changes of the facet joints left more than right   At L5-S1, no disc herniation or foraminal narrowing.   No fractures.   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     Electronically Signed   By: Franky Chard M.D.   On: 04/30/2023 09:29     Objective:  VS:  HT:    WT:   BMI:     BP:(!) 152/81  HR:65bpm  TEMP: ( )  RESP:  Physical Exam Vitals and nursing note reviewed.  Constitutional:      General: She  is not in acute distress.    Appearance: Normal appearance. She is not ill-appearing.  HENT:     Head: Normocephalic and atraumatic.     Right Ear: External ear normal.     Left Ear: External ear normal.  Eyes:     Extraocular Movements: Extraocular movements intact.  Cardiovascular:     Rate and Rhythm: Normal rate.     Pulses: Normal pulses.  Pulmonary:     Effort: Pulmonary effort is normal. No respiratory distress.  Abdominal:     General: There is no distension.     Palpations: Abdomen is soft.  Musculoskeletal:        General: Tenderness present.     Cervical back: Neck supple.     Right lower leg: No edema.     Left lower leg: No edema.     Comments: Patient has good distal strength with no pain over the greater trochanters.  No clonus or focal weakness.  Skin:    Findings: No erythema, lesion or rash.  Neurological:     General: No focal deficit present.     Mental Status: She is alert and oriented to person, place, and time.     Sensory: No sensory deficit.     Motor: No weakness or abnormal muscle tone.     Coordination: Coordination normal.  Psychiatric:        Mood and Affect: Mood normal.        Behavior: Behavior normal.      Imaging: No results found.

## 2023-09-30 DIAGNOSIS — M545 Low back pain, unspecified: Secondary | ICD-10-CM | POA: Diagnosis not present

## 2023-09-30 DIAGNOSIS — M542 Cervicalgia: Secondary | ICD-10-CM | POA: Diagnosis not present

## 2023-10-06 ENCOUNTER — Encounter: Admitting: Physical Medicine and Rehabilitation

## 2023-10-14 ENCOUNTER — Ambulatory Visit: Admitting: Physical Medicine and Rehabilitation

## 2023-10-14 ENCOUNTER — Other Ambulatory Visit: Payer: Self-pay

## 2023-10-14 VITALS — BP 142/86 | HR 77

## 2023-10-14 DIAGNOSIS — M47816 Spondylosis without myelopathy or radiculopathy, lumbar region: Secondary | ICD-10-CM | POA: Diagnosis not present

## 2023-10-14 MED ORDER — METHYLPREDNISOLONE ACETATE 40 MG/ML IJ SUSP
40.0000 mg | Freq: Once | INTRAMUSCULAR | Status: AC
  Administered 2023-10-14 (×2): 40 mg

## 2023-10-14 NOTE — Progress Notes (Signed)
 Pain Scale   Average Pain 7 Patient advising she has lower back pain that increases when standing and walking, and decreases when laying down.         +Driver, -BT, -Dye Allergies.

## 2023-10-19 NOTE — Procedures (Signed)
 Lumbar Facet Joint Nerve Denervation  Patient: Amanda [name]      Date of Birth: 1941-01-01 MRN: [id number] PCP: [name] JONELLE, DO      Visit Date: 10/14/2023   Universal Protocol:    Date/Time: 08/17/258:31 PM  Consent Given By: the patient  Position: PRONE  Additional Comments: Vital signs were monitored before and after the procedure. Patient was prepped and draped in the usual sterile fashion. The correct patient, procedure, and site was verified.   Injection Procedure Details:   Procedure diagnoses:  1. Spondylosis without myelopathy or radiculopathy, lumbar region      Meds Administered:  Meds ordered this encounter  Medications   methylPREDNISolone  acetate (DEPO-MEDROL ) injection 40 mg     Laterality: Left  Location/Site:  L4-L5, L3 and L4 medial branches and L5-S1, L4 medial branch and L5 dorsal ramus  Needle: 18 ga.,  10mm active tip, RF Cannula  Needle Placement: Along juncture of superior articular process and transverse pocess  Findings:  -Comments:  Procedure Details: For each desired target nerve, the corresponding transverse process (sacral ala for the L5 dorsal rami) was identified and the fluoroscope was positioned to square off the endplates of the corresponding vertebral body to achieve a true AP midline view.  The beam was then obliqued 15 to 20 degrees and caudally tilted 15 to 20 degrees to line up a trajectory along the target nerves. The skin over the target of the junction of superior articulating process and transverse process (sacral ala for the L5 dorsal rami) was infiltrated with 1ml of 1% Lidocaine  without Epinephrine .  The 18 gauge 10mm active tip outer cannula was advanced in trajectory view to the target.  This procedure was repeated for each target nerve.  Then, for all levels, the outer cannula placement was fine-tuned and the position was then confirmed with bi-planar imaging.    Test stimulation was done both at sensory  and motor levels to ensure there was no radicular stimulation. The target tissues were then infiltrated with 1 ml of 1% Lidocaine  without Epinephrine . Subsequently, a percutaneous neurotomy was carried out for 90 seconds at 80 degrees Celsius.  After the completion of the lesion, 1 ml of injectate was delivered. It was then repeated for each facet joint nerve mentioned above. Appropriate radiographs were obtained to verify the probe placement during the neurotomy.   Additional Comments:  The patient tolerated the procedure well Dressing: 2 x 2 sterile gauze and Band-Aid    Post-procedure details: Patient was observed during the procedure. Post-procedure instructions were reviewed.  Patient left the clinic in stable condition.

## 2023-10-19 NOTE — Progress Notes (Signed)
 Amanda [name] - 83 y.o. female MRN [id number]  Date of birth: 02/26/1941  Office Visit Note: Visit Date: 10/14/2023 PCP: [name] JONELLE, DO Referred by: [name] JONELLE, *  Subjective: Chief Complaint  Patient presents with   Lower Back - Pain   HPI:  Amanda [name] is a 83 y.o. female who comes in todayfor planned radiofrequency ablation of the Left L4-5 and L5-S1 Lumbar facet joints. This would be ablation of the corresponding medial branches and/or dorsal rami.  Patient has had double diagnostic blocks with more than 50% relief.  These are documented on pain diary.  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  They have no radicular pain.  Their axial pain is worse with standing and ambulating and on exam today with facet loading.  They have had physical therapy as well as home exercise program.  The imaging noted in the chart below indicated facet pathology. Accordingly they meet all the criteria and qualification for for radiofrequency ablation and we are going to complete this today hopefully for more longer term relief as part of comprehensive management program.  Just of note she had no ill effects of using the lidocaine  and Marcaine  in the previous procedure.  I do think the recent bout of cardiac issues she had after urologic procedure was likely the epinephrine  that was in the lidocaine .  Not sure of the mode of action of all that but I think that was the more likely issue and not the lidocaine  itself.   Review of Systems  Musculoskeletal:  Positive for back pain.  All other systems reviewed and are negative.  Otherwise per HPI.  Assessment & Plan: Visit Diagnoses:    ICD-10-CM   1. Spondylosis without myelopathy or radiculopathy, lumbar region  M47.816 XR C-ARM NO REPORT    Radiofrequency,Lumbar    methylPREDNISolone  acetate (DEPO-MEDROL ) injection 40 mg       Plan: No additional findings.   Meds & Orders:  Meds ordered this encounter  Medications   methylPREDNISolone  acetate (DEPO-MEDROL ) injection 40 mg    Orders Placed This Encounter  Procedures   Radiofrequency,Lumbar   XR C-ARM NO REPORT    Follow-up: Return for visit to requesting provider as needed.   Procedures: No procedures performed  Lumbar Facet Joint Nerve Denervation  Patient: Amanda [name]      Date of Birth: Oct 11, 1940 MRN: [id number] PCP: [name] JONELLE, DO      Visit Date: 10/14/2023   Universal Protocol:    Date/Time: 08/17/258:31 PM  Consent Given By: the patient  Position: PRONE  Additional Comments: Vital signs were monitored before and after the procedure. Patient was prepped and draped in the usual sterile fashion. The correct patient, procedure, and site was verified.   Injection Procedure Details:   Procedure diagnoses:  1. Spondylosis without myelopathy or radiculopathy, lumbar region      Meds Administered:  Meds ordered this encounter  Medications   methylPREDNISolone  acetate (DEPO-MEDROL ) injection 40 mg     Laterality: Left  Location/Site:  L4-L5, L3 and L4 medial branches and L5-S1, L4 medial branch and L5 dorsal ramus  Needle: 18 ga.,  10mm active tip, RF Cannula  Needle Placement: Along juncture of superior articular process and transverse pocess  Findings:  -Comments:  Procedure Details: For each desired target nerve, the corresponding transverse process (sacral ala for  the L5 dorsal rami) was identified and the fluoroscope was positioned to square off the endplates of the corresponding vertebral body to achieve a true AP midline view.  The beam was then obliqued 15 to 20 degrees and caudally tilted 15 to 20 degrees to line up a trajectory along the target nerves. The skin over the target of the junction of superior articulating process and transverse process (sacral ala for the L5 dorsal rami) was infiltrated with  1ml of 1% Lidocaine  without Epinephrine .  The 18 gauge 10mm active tip outer cannula was advanced in trajectory view to the target.  This procedure was repeated for each target nerve.  Then, for all levels, the outer cannula placement was fine-tuned and the position was then confirmed with bi-planar imaging.    Test stimulation was done both at sensory and motor levels to ensure there was no radicular stimulation. The target tissues were then infiltrated with 1 ml of 1% Lidocaine  without Epinephrine . Subsequently, a percutaneous neurotomy was carried out for 90 seconds at 80 degrees Celsius.  After the completion of the lesion, 1 ml of injectate was delivered. It was then repeated for each facet joint nerve mentioned above. Appropriate radiographs were obtained to verify the probe placement during the neurotomy.   Additional Comments:  The patient tolerated the procedure well Dressing: 2 x 2 sterile gauze and Band-Aid    Post-procedure details: Patient was observed during the procedure. Post-procedure instructions were reviewed.  Patient left the clinic in stable condition.      Clinical History: CLINICAL DATA:  Low back pain for 6 weeks despite treatment   For the purposes of this dictation, the lowest well-formed intervertebral disc spaces presumed to be the L5-S1 level, and there presumed to be 5 lumbar type vertebral bodies.   EXAM: MRI LUMBAR SPINE WITHOUT CONTRAST   TECHNIQUE: Multiplanar, multisequence MR imaging of the lumbar spine was performed. No intravenous contrast was administered.   COMPARISON:  None Available.   FINDINGS: For the purposes of this dictation, the lowest well-formed intervertebral disc spaces presumed to be the L5-S1 level, and there presumed to be 5 lumbar type vertebral bodies.   The vertebral bodies are normally aligned with preservation of the normal lumbar lordosis. Vertebral body heights are well preserved. Signal intensity within the  vertebral body bone marrow and spinal cord is normal. The conus medullaris terminates at the T12 level.   At L1-2, large central circumferential right paracentral bulging disc with a small right paracentral disc herniation component effacing the ventral anterior epidural space flattening the thecal sac and encroaching the right neural foramina   At L2-3, narrowing of the disc space with a central small disc herniation effacing the ventral anterior epidural space flattening the thecal sac without foraminal encroachment   At L3-4, narrowing of the disc space with a small central circumferential bulging disc flattening the thecal sac with mild bilateral foraminal encroachment   At L4-5, narrowing of the disc space with a central spondylitic disc herniation effacing the ventral anterior epidural space flattening of the thecal sac with mild bilateral bulging disc extending into the neural foramina with hypertrophic osteoarthritic changes of the facet joints left more than right   At L5-S1, no disc herniation or foraminal narrowing.   No fractures.   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     Electronically Signed   By: Franky Chard M.D.   On: 04/30/2023 09:29  Objective:  VS:  HT:    WT:   BMI:     BP:(!) 142/86  HR:77bpm  TEMP: ( )  RESP:  Physical Exam Vitals and nursing note reviewed.  Constitutional:      General: She is not in acute distress.    Appearance: Normal appearance. She is not ill-appearing.  HENT:     Head: Normocephalic and atraumatic.     Right Ear: External ear normal.     Left Ear: External ear normal.  Eyes:     Extraocular Movements: Extraocular movements intact.  Cardiovascular:     Rate and Rhythm: Normal rate.     Pulses: Normal pulses.  Pulmonary:     Effort: Pulmonary effort is normal. No respiratory distress.  Abdominal:     General: There is no  distension.     Palpations: Abdomen is soft.  Musculoskeletal:        General: Tenderness present.     Cervical back: Neck supple.     Right lower leg: No edema.     Left lower leg: No edema.     Comments: Patient has good distal strength with no pain over the greater trochanters.  No clonus or focal weakness.  Skin:    Findings: No erythema, lesion or rash.  Neurological:     General: No focal deficit present.     Mental Status: She is alert and oriented to person, place, and time.     Sensory: No sensory deficit.     Motor: No weakness or abnormal muscle tone.     Coordination: Coordination normal.  Psychiatric:        Mood and Affect: Mood normal.        Behavior: Behavior normal.      Imaging: No results found.

## 2023-10-31 ENCOUNTER — Telehealth: Payer: Self-pay

## 2023-10-31 NOTE — Telephone Encounter (Signed)
 Patient states she is still having pain. Seems to have traveled up spine and feels muscular. The right side is slightly better today. She still has a couple weeks for left side to improve. OV was made for 9/11 to discuss next steps.

## 2023-11-06 ENCOUNTER — Other Ambulatory Visit: Payer: Self-pay | Admitting: Family Medicine

## 2023-11-12 ENCOUNTER — Encounter: Payer: Self-pay | Admitting: Pharmacist

## 2023-11-12 NOTE — Progress Notes (Signed)
 Pharmacy Quality Measure Review  This patient is appearing on a report for being at risk of failing the adherence measure for cholesterol (statin) medications this calendar year.   Medication: rosuvastatin  20mg  Last fill date: 06/27/2023 for 90 day supply She has also taken rosuvastatin  5mg  in past - last refill was 05/22/2023 for 84 days supply.   Patient was seen by cardiology 09/15/2023 for follow up of suspected NSTEMI 06/2023 with minimal evidence of CAD.  No evidence of Takotsubo.  Thought to be related to vasospasm and thought not to be NSTEMI. They stopped beta-blocker and rosuvastatin  was increased from 5 to 20 mg.  Also noted the following below regarding lipid therapy Hyperlipidemia LDL is 110 April 2025. Patient self discontinued rosuvastatin  citing that she had side effects from this, though was unable to remember exactly what this was.  She has no evidence of CAD and with no history of stroke or diabetes or other risk factors.  She is 83 years old.  Discussed risk and benefits and she has elected to discontinue.  No action needed at this time. Statin has been discontinued and cardiology team is aware.   Madelin Ray, PharmD Clinical Pharmacist [facility]  Population Health [phone]

## 2023-11-13 ENCOUNTER — Ambulatory Visit: Admitting: Physical Medicine and Rehabilitation

## 2023-11-13 ENCOUNTER — Encounter: Payer: Self-pay | Admitting: Physical Medicine and Rehabilitation

## 2023-11-13 DIAGNOSIS — G8929 Other chronic pain: Secondary | ICD-10-CM

## 2023-11-13 DIAGNOSIS — M545 Low back pain, unspecified: Secondary | ICD-10-CM

## 2023-11-13 DIAGNOSIS — M7918 Myalgia, other site: Secondary | ICD-10-CM
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
## 2023-11-13 NOTE — Progress Notes (Signed)
 Pain Scale   Average Pain 7 Patient advising he has lower back pain that is constant and that the procedure did not help        +Driver, -BT, -Dye Allergies.

## 2023-11-17 DIAGNOSIS — M542 Cervicalgia: Secondary | ICD-10-CM | POA: Diagnosis not present

## 2023-11-17 DIAGNOSIS — M545 Low back pain, unspecified: Secondary | ICD-10-CM | POA: Diagnosis not present

## 2023-11-24 DIAGNOSIS — M545 Low back pain, unspecified: Secondary | ICD-10-CM | POA: Diagnosis not present

## 2023-11-24 DIAGNOSIS — M542 Cervicalgia: Secondary | ICD-10-CM | POA: Diagnosis not present

## 2023-11-27 DIAGNOSIS — K5900 Constipation, unspecified: Secondary | ICD-10-CM | POA: Diagnosis not present

## 2023-11-27 DIAGNOSIS — N952 Postmenopausal atrophic vaginitis: Secondary | ICD-10-CM | POA: Diagnosis not present

## 2023-11-27 DIAGNOSIS — N958 Other specified menopausal and perimenopausal disorders: Secondary | ICD-10-CM | POA: Diagnosis not present

## 2023-11-27 DIAGNOSIS — N3281 Overactive bladder: Secondary | ICD-10-CM | POA: Diagnosis not present

## 2023-11-28 DIAGNOSIS — M542 Cervicalgia: Secondary | ICD-10-CM | POA: Diagnosis not present

## 2023-11-28 DIAGNOSIS — M545 Low back pain, unspecified: Secondary | ICD-10-CM | POA: Diagnosis not present

## 2023-12-01 DIAGNOSIS — M545 Low back pain, unspecified: Secondary | ICD-10-CM | POA: Diagnosis not present

## 2023-12-01 DIAGNOSIS — M542 Cervicalgia: Secondary | ICD-10-CM | POA: Diagnosis not present

## 2023-12-03 ENCOUNTER — Telehealth: Payer: Self-pay

## 2023-12-03 DIAGNOSIS — K080 Exfoliation of teeth due to systemic causes: Secondary | ICD-10-CM | POA: Diagnosis not present

## 2023-12-03 NOTE — Telephone Encounter (Signed)
 Copied from CRM [id number]. Topic: Clinical - Medication Question >> Dec 03, 2023 11:00 AM Drema MATSU wrote: Reason for CRM: Patient is requesting a callback from Tammy in regards to a new medication for bladder control.

## 2023-12-03 NOTE — Telephone Encounter (Signed)
 Tried to contact patient. Unable to reach her. LM on VM with call back number [phone]

## 2023-12-04 ENCOUNTER — Ambulatory Visit (INDEPENDENT_AMBULATORY_CARE_PROVIDER_SITE_OTHER): Admitting: Pulmonary Disease

## 2023-12-04 ENCOUNTER — Encounter: Payer: Self-pay | Admitting: Pulmonary Disease

## 2023-12-04 VITALS — BP 120/72 | HR 74 | Temp 97.9°F | Wt 154.0 lb

## 2023-12-04 DIAGNOSIS — J479 Bronchiectasis, uncomplicated: Secondary | ICD-10-CM

## 2023-12-04 NOTE — Patient Instructions (Signed)
  VISIT SUMMARY: Today, we discussed your breathing difficulties and back pain, which are related to your chronic right middle lobe bronchiectasis. We also reviewed your history of recurrent pulmonary infections and current symptoms.  YOUR PLAN: RIGHT MIDDLE LOBE BRONCHIECTASIS: You have chronic right middle lobe bronchiectasis, likely due to recurrent pneumonias. This condition is causing your breathing difficulties and back pain. -We will order a chest CT scan to reassess your bronchiectasis. -We will also order a lung function test. -Continue taking Mucinex  regularly as it has been helpful in reducing colds. -Please purchase and use a flutter valve device (acapella valve) to help clear secretions. -Schedule a follow-up appointment in six months.

## 2023-12-04 NOTE — Progress Notes (Signed)
 Amanda [name]    [id number]    1941/02/19  Primary Care Physician:Lowne Cyndee Jamee SAUNDERS, DO  Referring Physician: Antonio Cyndee, Jamee SAUNDERS, DO [address] [place],  [place] [address]  Chief complaint: Follow-up for dyspnea, right middle lobe bronchiectasis  HPI: 83 y.o.  with history of hypertension, lymphoma, right mid lobe bronchiectasis complains of chronic dyspnea on exertion with intermittent mucus production.  She had scan in December 2022 for follow-up of lymphoma which showed right middle lobe consolidation for which she got 2 rounds of antibiotics.  She has history of focal right middle lobe bronchiectasis.  Interim history: Discussed the use of AI scribe software for clinical note transcription with the patient, who gave verbal consent to proceed.  History of Present Illness Amanda [name] is an 83 year old female with right middle lobe bronchiectasis who presents with breathing difficulties and back pain.  Dyspnea - Breathing difficulties are present and exacerbated by walking short distances. - Dyspnea worsens with back pain and requires her to lie down to catch her breath. - Rest is needed after ambulation due to shortness of breath.  Back pain - Significant back pain impacts her breathing and daily activities. - Pain is aggravated by walking short distances. - Multiple treatments attempted, including ablations, physical therapy, water  therapy, and chiropractic care, with persistent symptoms.  Cough and sputum production - Cough is mild at present. - No sputum production currently. - Has not used a flutter valve recently due to misplacement. - Takes Mucinex  nightly, which has reduced the frequency of colds.  Recurrent pulmonary infections - History of recurrent pneumonias associated with right middle lobe bronchiectasis.  Recently admitted after she developed a non-ST elevation MI after getting lidocaine  injection as an outpatient.  Cardiac  cath did not show any significant coronary obstruction.   Relevant pulmonary history Pets: Dog Occupation: Retired Barista Exposures: No known exposures.  No mold, hot tub, Jacuzzi.  No feather pillows or comforters Smoking history: Never smoker Travel history: Previously lived in [place] , [place] .  No significant recent travel Relevant family history: No family history of lung disease   Outpatient Encounter Medications as of 12/04/2023  Medication Sig   acetaminophen  (TYLENOL ) 325 MG tablet Take 1-2 tablets (325-650 mg total) by mouth every 8 (eight) hours as needed for mild pain.   albuterol  (VENTOLIN  HFA) 108 (90 Base) MCG/ACT inhaler INHALE 2 PUFFS INTO THE LUNGS EVERY 6 HOURS AS NEEDED FOR WHEEZING OR SHORTNESS OF BREATH   amLODipine  (NORVASC ) 10 MG tablet Take 1 tablet (10 mg total) by mouth daily.   buPROPion  (WELLBUTRIN  XL) 150 MG 24 hr tablet Take 1 tablet (150 mg total) by mouth daily.   Calcium -Cholecalciferol (QC CALCIUM  500MG -D3) 500-5 MG-MCG TABS Take 500 mg by mouth daily.   Cholecalciferol (VITAMIN D3 ADULT GUMMIES) 25 MCG (1000 UT) CHEW Chew 2 each by mouth daily.   guaiFENesin  (MUCINEX ) 600 MG 12 hr tablet Take 600 mg by mouth daily.   ibuprofen (ADVIL) 200 MG tablet Take 200 mg by mouth daily as needed for mild pain (pain score 1-3).   levothyroxine  (SYNTHROID ) 125 MCG tablet Take 1 tablet (125 mcg total) by mouth daily before breakfast.   nitroGLYCERIN  (NITROSTAT ) 0.4 MG SL tablet Place 1 tablet (0.4 mg total) under the tongue every 5 (five) minutes x 3 doses as needed for chest pain.   pantoprazole  (PROTONIX ) 40 MG tablet Take 1 tablet (40 mg  total) by mouth daily.   senna-docusate (SENOKOT-S) 8.6-50 MG tablet Take 2 tablets by mouth at bedtime.   rosuvastatin  (CRESTOR ) 20 MG tablet Take 1 tablet (20 mg total) by mouth daily. (Patient not taking: Reported on 12/04/2023)   triamcinolone  cream (KENALOG ) 0.1 % Apply 1 Application topically 2 (two) times  daily. (Patient not taking: Reported on 12/04/2023)   No facility-administered encounter medications on file as of 12/04/2023.   Vitals:   12/04/23 1423  BP: 120/72  Pulse: 74  Temp: 97.9 F (36.6 C)  Weight: 154 lb (69.9 kg)  SpO2: 97%  TempSrc: Oral     Physical Exam GEN: No acute distress CV: Regular rate and rhythm no murmurs LUNGS: Clear to auscultation bilaterally normal respiratory effort SKIN JOINTS: Warm and dry no rash    Data Reviewed: Imaging: CT chest 01/21/2021-pleural thickening, atelectasis in the lower lobe, mild scarring and bronchiectasis over the right base and medial right middle lobe  PET scan 02/16/2021-volume loss in the right middle lobe with bronchiectasis and groundglass changes.    Chest x-ray 04/17/2021-mild hyperinflation, improving bibasal infiltrate  Chest x-ray 06/26/2023-lungs are clear.  PFTs: 06/04/21 1.50 [73%], FEV1 1.38 [91%], F/F 92, TLC 3.30 [94%], DLCO 12.45 [76%] Minimal restriction  Labs: WBC 11.3, hemoglobin 11.8, platelets 493 Quantitative immunoglobulins 06/05/2018-IgE less than 2, IgG 521, IgA 57  Assessment & Plan Right middle lobe bronchiectasis Chronic right middle lobe bronchiectasis, likely secondary to recurrent pneumonias. As she has history of recurrent pneumonia this raises suspicion for right middle lobe syndrome.  Currently she is not making enough sputum to test for AFB and cultures  Symptoms include exertional dyspnea and back pain exacerbating breathing difficulties. No current sputum production. No recent CT scan in the past three years. Previous use of Mucinex  has been beneficial in reducing colds. No current use of a flutter valve device. Recent medically induced myocardial infarction with good cardiac recovery, no significant impact on current pulmonary status. - Order chest CT to reassess bronchiectasis - Order lung function test - Recommend regular use of Mucinex  - Advise purchase and use of a flutter valve  device (acapella valve) to aid in secretion clearance - Schedule follow-up appointment in six months   Plan/Recommendations: Flutter valve, Mucinex  Follow-up CT, PFTs  Lonna Coder MD [facility] 12/04/2023, 2:34 PM  CC: [name] JONELLE, *

## 2023-12-05 DIAGNOSIS — M545 Low back pain, unspecified: Secondary | ICD-10-CM | POA: Diagnosis not present

## 2023-12-05 DIAGNOSIS — M542 Cervicalgia: Secondary | ICD-10-CM | POA: Diagnosis not present

## 2023-12-05 NOTE — Progress Notes (Addendum)
   12/05/2023 Name: Amanda [name] MRN: [id number] DOB: 1940-09-12  Chief Complaint  Patient presents with   Advice Only   Medication Management     Subjective: Spoke with patient by phone today. She reports that she saw urologist and was prescribed estradiol vaginal cream and Myrbetriq . She reports that cost of Myrbetriq  was too high. Cost was $187 foMyrbetriq .  Additionally Myrbetriq  was sent to the wrong [facility] pharmacy - she is now using [facility] and not the one on [address] in [place].    Medication Access/Adherence  Current Pharmacy:  [id number] [place], [place] - [address] AT [facility] [address] [facility] [address] [place] [address] Phone: [phone] Fax: [phone]   Patient reports affordability concerns with their medications: Yes  Patient reports access/transportation concerns to their pharmacy: No  Patient reports adherence concerns with their medications:  Yes  - not taking rosuvastatin  due to concerns about side effect. Per patient she never took statin.   She has a medically induced NSTEMI in April 2025.  Cardiac catheterization showed no CAD  See notes below for recommendation per cardiology at follow up on 09/15/2023  Hyperlipidemia LDL is 110 April 2025. Patient self discontinued rosuvastatin  citing that she had side effects from this, though was unable to remember exactly what this was.  She has no evidence of CAD and with no history of stroke or diabetes or other risk factors.  She is 83 years old.  Discussed risk and benefits and she has elected to discontinue.     Objective:  Lab Results  Component Value Date   HGBA1C 6.1 (H) 11/18/2014    Lab Results  Component Value Date   CREATININE 1.08 (H) 08/08/2023   BUN 25 08/08/2023   NA 140 08/08/2023   K 4.7 08/08/2023   CL 104 08/08/2023   CO2 25 08/08/2023    Lab Results  Component Value Date   CHOL 186 06/27/2023   HDL 64  06/27/2023   LDLCALC 110 (H) 06/27/2023   LDLDIRECT 123.0 04/27/2020   TRIG 60 06/27/2023   CHOLHDL 2.9 06/27/2023    Assessment/Plan:   Medication Management: - Checked her 2025 BCBS Medicare formulary - brand Myrbetriq  should be $45 / 30 days and $90 / 90 day but she has a $375 deductible to meet. Generic Myrbetriq  is not on her 2025 formulary.  - Spoke with [facility] on [address]. Her copay for Myrbetriq  was $187.68 - $142.68 was for meet her deductible and $45 was her copay. After initial fill Myrbetriq  will be $45 / 30 days or $90 / 90 days if she uses mail order. Patient will discuss further with urologist. I did mention there were lower cost alternatives but they have side effects like dry mouth and possible effects on cognition.  - Discussed 2026 Medicare changes. If she pick the same [facility] plan for 2026, it looks like her deductible will increase to $615 for all preferred brand medications. Encouraged patient to review available plans and options.   Medication Adherence:  - Patient has stopped rosuvastatin  on her own. Cardiology office is aware and has addressed.   Madelin Ray, PharmD Clinical Pharmacist [facility]  Population Health [phone]

## 2023-12-05 NOTE — Addendum Note (Signed)
 Addended by: CARLA MILLING B on: 12/05/2023 11:52 AM   Modules accepted: Orders

## 2023-12-15 DIAGNOSIS — M545 Low back pain, unspecified: Secondary | ICD-10-CM | POA: Diagnosis not present

## 2023-12-15 DIAGNOSIS — M542 Cervicalgia: Secondary | ICD-10-CM | POA: Diagnosis not present

## 2023-12-18 ENCOUNTER — Telehealth: Payer: Self-pay | Admitting: Pulmonary Disease

## 2023-12-18 NOTE — Telephone Encounter (Signed)
 PT has been scheduled and is aware of appt

## 2023-12-18 NOTE — Telephone Encounter (Signed)
 Pt is aware of below message and voiced her understanding.   Appt scheduled with Dr. Theophilus 01/07/2024. PFT scheduled 01/06/2024.  PCC's, please schedule CT prior to 11/5

## 2023-12-18 NOTE — Telephone Encounter (Signed)
 Patient called and is concerned about waiting for CT scan in April. She is having difficulty breathing and would like to get it done as soon as possible. Please advise. Thank you.

## 2023-12-18 NOTE — Telephone Encounter (Signed)
 Please reschedule high-resolution CT and PFTs at next available time and make follow-up in clinic with me after these tests are completed.  Thank you

## 2023-12-22 DIAGNOSIS — M545 Low back pain, unspecified: Secondary | ICD-10-CM | POA: Diagnosis not present

## 2023-12-22 DIAGNOSIS — M542 Cervicalgia: Secondary | ICD-10-CM | POA: Diagnosis not present

## 2023-12-26 ENCOUNTER — Ambulatory Visit
Admission: RE | Admit: 2023-12-26 | Discharge: 2023-12-26 | Disposition: A | Discharge status: Home / Self Care | Source: Ambulatory Visit | Attending: Pulmonary Disease | Admitting: Pulmonary Disease

## 2023-12-26 DIAGNOSIS — M542 Cervicalgia: Secondary | ICD-10-CM | POA: Diagnosis not present

## 2023-12-26 DIAGNOSIS — M545 Low back pain, unspecified: Secondary | ICD-10-CM | POA: Diagnosis not present

## 2023-12-26 DIAGNOSIS — J479 Bronchiectasis, uncomplicated: Secondary | ICD-10-CM | POA: Diagnosis not present

## 2023-12-29 DIAGNOSIS — M542 Cervicalgia: Secondary | ICD-10-CM | POA: Diagnosis not present

## 2023-12-29 DIAGNOSIS — M545 Low back pain, unspecified: Secondary | ICD-10-CM | POA: Diagnosis not present

## 2024-01-05 ENCOUNTER — Encounter: Payer: Self-pay | Admitting: Radiology

## 2024-01-06 ENCOUNTER — Ambulatory Visit: Admitting: *Deleted

## 2024-01-06 DIAGNOSIS — J479 Bronchiectasis, uncomplicated: Secondary | ICD-10-CM

## 2024-01-06 LAB — PULMONARY FUNCTION TEST
DL/VA % pred: 86 %
DL/VA: 3.62 ml/min/mmHg/L
DLCO cor % pred: 55 %
DLCO cor: 9 ml/min/mmHg
DLCO unc % pred: 55 %
DLCO unc: 9 ml/min/mmHg
FEF 25-75 Post: 1.41 L/s
FEF 25-75 Pre: 1.49 L/s
FEF2575-%Change-Post: -5 %
FEF2575-%Pred-Post: 152 %
FEF2575-%Pred-Pre: 161 %
FEV1-%Change-Post: -2 %
FEV1-%Pred-Post: 93 %
FEV1-%Pred-Pre: 96 %
FEV1-Post: 1.31 L
FEV1-Pre: 1.35 L
FEV1FVC-%Change-Post: 4 %
FEV1FVC-%Pred-Pre: 116 %
FEV6-%Change-Post: -7 %
FEV6-%Pred-Post: 82 %
FEV6-%Pred-Pre: 89 %
FEV6-Post: 1.48 L
FEV6-Pre: 1.6 L
FEV6FVC-%Pred-Post: 107 %
FEV6FVC-%Pred-Pre: 107 %
FVC-%Change-Post: -7 %
FVC-%Pred-Post: 77 %
FVC-%Pred-Pre: 83 %
FVC-Post: 1.48 L
FVC-Pre: 1.6 L
Post FEV1/FVC ratio: 89 %
Post FEV6/FVC ratio: 100 %
Pre FEV1/FVC ratio: 84 %
Pre FEV6/FVC Ratio: 100 %
RV % pred: 54 %
RV: 1.26 L
TLC % pred: 66 %
TLC: 2.94 L

## 2024-01-06 NOTE — Patient Instructions (Signed)
 Full PFT performed today.

## 2024-01-06 NOTE — Progress Notes (Signed)
 Full PFT performed today.

## 2024-01-07 ENCOUNTER — Ambulatory Visit: Admitting: Pulmonary Disease

## 2024-01-07 ENCOUNTER — Encounter: Payer: Self-pay | Admitting: Pulmonary Disease

## 2024-01-07 VITALS — BP 125/75 | HR 70 | Temp 97.9°F | Ht 60.0 in | Wt 154.0 lb

## 2024-01-07 DIAGNOSIS — J479 Bronchiectasis, uncomplicated: Secondary | ICD-10-CM

## 2024-01-07 NOTE — Progress Notes (Signed)
 Amanda [name]    [id number]    1941-01-23  Primary Care Physician:Lowne Cyndee Jamee SAUNDERS, DO  Referring Physician: Antonio Cyndee, Jamee SAUNDERS, DO [address] [place],  [place] [address]  Chief complaint: Follow-up for dyspnea, right middle lobe bronchiectasis  HPI: 83 y.o.  with history of hypertension, lymphoma, right mid lobe bronchiectasis complains of chronic dyspnea on exertion with intermittent mucus production.  She had scan in December 2022 for follow-up of lymphoma which showed right middle lobe consolidation for which she got 2 rounds of antibiotics.  She has history of focal right middle lobe bronchiectasis.  Interim history: Discussed the use of AI scribe software for clinical note transcription with the patient, who gave verbal consent to proceed.  History of Present Illness  Interim history: Amanda [name] is an 83 year old female with bronchiectasis who presents with worsening shortness of breath.  Dyspnea and exercise tolerance - Persistent dyspnea with minimal exertion, including walking short distances and performing household chores - Requires frequent rest due to shortness of breath - No significant changes in symptoms since the last visit  Sputum production - Occasional mucus production - Instructed to provide sputum samples for culture  Fatigue - Fatigue attributed to respiratory condition  Bronchodilator and mucolytic use - Uses albuterol  inhaler as needed - Advised to increase Mucinex  to twice daily  Imaging findings - CT scan suggests possible chronic mycobacterial infection   Relevant pulmonary history Pets: Dog Occupation: Retired barista Exposures: No known exposures.  No mold, hot tub, Jacuzzi.  No feather pillows or comforters Smoking history: Never smoker Travel history: Previously lived in [place] , [place] .  No significant recent travel Relevant family history: No family history of lung  disease  Outpatient Encounter Medications as of 01/07/2024  Medication Sig   acetaminophen  (TYLENOL ) 325 MG tablet Take 1-2 tablets (325-650 mg total) by mouth every 8 (eight) hours as needed for mild pain.   albuterol  (VENTOLIN  HFA) 108 (90 Base) MCG/ACT inhaler INHALE 2 PUFFS INTO THE LUNGS EVERY 6 HOURS AS NEEDED FOR WHEEZING OR SHORTNESS OF BREATH   amLODipine  (NORVASC ) 10 MG tablet Take 1 tablet (10 mg total) by mouth daily.   buPROPion  (WELLBUTRIN  XL) 150 MG 24 hr tablet Take 1 tablet (150 mg total) by mouth daily.   Calcium -Cholecalciferol (QC CALCIUM  500MG -D3) 500-5 MG-MCG TABS Take 500 mg by mouth daily.   guaiFENesin  (MUCINEX ) 600 MG 12 hr tablet Take 600 mg by mouth daily.   ibuprofen (ADVIL) 200 MG tablet Take 200 mg by mouth daily as needed for mild pain (pain score 1-3).   levothyroxine  (SYNTHROID ) 125 MCG tablet Take 1 tablet (125 mcg total) by mouth daily before breakfast.   pantoprazole  (PROTONIX ) 40 MG tablet Take 1 tablet (40 mg total) by mouth daily.   senna-docusate (SENOKOT-S) 8.6-50 MG tablet Take 2 tablets by mouth at bedtime.   Cholecalciferol (VITAMIN D3 ADULT GUMMIES) 25 MCG (1000 UT) CHEW Chew 2 each by mouth daily. (Patient not taking: Reported on 01/07/2024)   estradiol (ESTRACE) 0.1 MG/GM vaginal cream Place 1 Applicatorful vaginally. Use once a day for 14 days, then decrease to use twice a week thereafter. (Patient not taking: Reported on 01/07/2024)   mirabegron  ER (MYRBETRIQ ) 25 MG TB24 tablet Take 25 mg by mouth daily. (Patient not taking: Reported on 01/07/2024)   nitroGLYCERIN  (NITROSTAT ) 0.4 MG SL tablet Place 1 tablet (0.4 mg total) under the tongue every 5 (  five) minutes x 3 doses as needed for chest pain. (Patient not taking: Reported on 01/07/2024)   rosuvastatin  (CRESTOR ) 20 MG tablet Take 1 tablet (20 mg total) by mouth daily. (Patient not taking: Reported on 01/07/2024)   triamcinolone  cream (KENALOG ) 0.1 % Apply 1 Application topically 2 (two) times daily.  (Patient not taking: Reported on 01/07/2024)   No facility-administered encounter medications on file as of 01/07/2024.   Vitals:   01/07/24 1331  BP: 125/75  Pulse: 70  Temp: 97.9 F (36.6 C)  Height: 5' (1.524 m)  Weight: 154 lb (69.9 kg)  SpO2: 95%  TempSrc: Oral  BMI (Calculated): 30.08     Physical Exam  GEN: No acute distress CV: Regular rate and rhythm no murmurs LUNGS: Clear to auscultation bilaterally normal respiratory effort SKIN JOINTS: Warm and dry no rash    Data Reviewed: Imaging: CT chest 01/21/2021-pleural thickening, atelectasis in the lower lobe, mild scarring and bronchiectasis over the right base and medial right middle lobe  PET scan 02/16/2021-volume loss in the right middle lobe with bronchiectasis and groundglass changes.    Chest x-ray 04/17/2021-mild hyperinflation, improving bibasal infiltrate  Chest x-ray 06/26/2023-lungs are clear.  CT high-resolution 12/26/2023-cylindrical bronchiectasis with centrilobular nodules, scarring.  Mildly worse compared to 2022.  Patchy air trapping.  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 Persistent inflammation and airway dilation with decreased lung function compared to last year. Differential diagnosis includes atypical mycobacterial infection, as suggested by CT scan findings. Sputum culture is the first step to confirm diagnosis. Bronchoscopy is a potential alternative if sputum culture is inconclusive, but it is invasive and requires anesthesia,  which is to be avoided if possible. Treatment with antibiotics may be harsh and prolonged if mycobacterial infection is confirmed. - Obtain sputum sample for culture on two separate days, preferably early morning specimens. - Use flutter valve device daily, 2-3 times a day, to clear secretions. - Take Mucinex  twice daily to aid in mucus clearance. - Stay well hydrated to facilitate mucus production and clearance. - Avoid bronchoscopy unless sputum culture is inconclusive and symptoms do not improve with current management.   Plan/Recommendations: Flutter valve, Mucinex  Sputum cultures for AFB  I personally spent a total of 45 minutes in the care of the patient today including preparing to see the patient, getting/reviewing separately obtained history, performing a medically appropriate exam/evaluation, placing orders, referring and communicating with other health care professionals, documenting clinical information in the EHR, independently interpreting results, and communicating results.   Lonna Coder MD [facility] 01/07/2024, 1:44 PM  CC: [name] JONELLE, *

## 2024-01-07 NOTE — Patient Instructions (Signed)
  VISIT SUMMARY: You visited us  today due to worsening shortness of breath related to your bronchiectasis. We discussed your symptoms, including your exercise tolerance, mucus production, and fatigue. We also reviewed your current medications and imaging findings.  YOUR PLAN: BRONCHIECTASIS WITH SUSPECTED PULMONARY MYCOBACTERIAL INFECTION: You have chronic bronchiectasis 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 pulmonary mycobacterial infection, which is causing persistent inflammation and decreased lung function. -Obtain sputum samples for culture on two separate days, preferably early in the morning. -Use a flutter valve device daily, 2-3 times a day, to help clear secretions. -Take Mucinex  twice daily to help with mucus clearance. -Stay well hydrated to help with mucus production and clearance. -Avoid bronchoscopy unless the sputum culture is inconclusive and your symptoms do not improve with the current management.

## 2024-01-08 ENCOUNTER — Ambulatory Visit: Admitting: Pulmonary Disease

## 2024-01-08 ENCOUNTER — Other Ambulatory Visit

## 2024-01-08 DIAGNOSIS — J479 Bronchiectasis, uncomplicated: Secondary | ICD-10-CM

## 2024-01-09 ENCOUNTER — Other Ambulatory Visit

## 2024-01-09 DIAGNOSIS — J479 Bronchiectasis, uncomplicated: Secondary | ICD-10-CM

## 2024-01-11 LAB — RESPIRATORY CULTURE OR RESPIRATORY AND SPUTUM CULTURE: MICRO NUMBER:: 17206532

## 2024-01-19 DIAGNOSIS — M545 Low back pain, unspecified: Secondary | ICD-10-CM | POA: Diagnosis not present

## 2024-01-19 DIAGNOSIS — M542 Cervicalgia: Secondary | ICD-10-CM | POA: Diagnosis not present

## 2024-01-23 DIAGNOSIS — M545 Low back pain, unspecified: Secondary | ICD-10-CM | POA: Diagnosis not present

## 2024-01-23 DIAGNOSIS — M542 Cervicalgia: Secondary | ICD-10-CM | POA: Diagnosis not present

## 2024-01-26 DIAGNOSIS — M542 Cervicalgia: Secondary | ICD-10-CM | POA: Diagnosis not present

## 2024-01-26 DIAGNOSIS — M545 Low back pain, unspecified: Secondary | ICD-10-CM | POA: Diagnosis not present

## 2024-01-30 LAB — AFB IDENTIFICATION BY PCR
M avium complex: POSITIVE — AB
M tuberculosis complex: NEGATIVE

## 2024-01-30 LAB — AFB CULTURE WITH SMEAR (NOT AT ARMC)
Acid Fast Culture: POSITIVE — AB
Acid Fast Smear: NEGATIVE

## 2024-02-02 ENCOUNTER — Other Ambulatory Visit: Payer: Self-pay | Admitting: *Deleted

## 2024-02-02 DIAGNOSIS — M545 Low back pain, unspecified: Secondary | ICD-10-CM | POA: Diagnosis not present

## 2024-02-02 DIAGNOSIS — I10 Essential (primary) hypertension: Secondary | ICD-10-CM

## 2024-02-02 DIAGNOSIS — M542 Cervicalgia: Secondary | ICD-10-CM | POA: Diagnosis not present

## 2024-02-02 MED ORDER — AMLODIPINE BESYLATE 10 MG PO TABS: 10.0000 mg | ORAL_TABLET | Freq: Every day | ORAL | 0 refills | Status: AC

## 2024-02-03 ENCOUNTER — Telehealth: Payer: Self-pay

## 2024-02-03 DIAGNOSIS — H53413 Scotoma involving central area, bilateral: Secondary | ICD-10-CM | POA: Diagnosis not present

## 2024-02-03 DIAGNOSIS — J479 Bronchiectasis, uncomplicated: Secondary | ICD-10-CM

## 2024-02-03 NOTE — Telephone Encounter (Signed)
 Copied from CRM [id number]. Topic: Clinical - Medical Advice >> Feb 03, 2024  2:43 PM Brittany M wrote: Reason for CRM: Patient needing hose and mouth piece for nebulizer-  please reach out to patient- needing to have it ordered through [facility]

## 2024-02-03 NOTE — Addendum Note (Signed)
 Addended by: Netra Postlethwait D on: 02/03/2024 04:26 PM   Modules accepted: Orders

## 2024-02-03 NOTE — Telephone Encounter (Signed)
 Order placed, message sent to [employer] [facility] reps.

## 2024-02-03 NOTE — Telephone Encounter (Signed)
 Received message back from [employer]- Pt has never been prescribed a nebulizer through them.   I called and spoke w/ Pt- she will need to discuss her need for nebulizer and nebulizer treatments w/ pulmonary or at her visit on 02/10/24 w/ Dr. Antonio. Pt verbalized understanding.

## 2024-02-09 DIAGNOSIS — M545 Low back pain, unspecified: Secondary | ICD-10-CM | POA: Diagnosis not present

## 2024-02-09 DIAGNOSIS — M542 Cervicalgia: Secondary | ICD-10-CM | POA: Diagnosis not present

## 2024-02-10 ENCOUNTER — Encounter: Payer: Self-pay | Admitting: Family Medicine

## 2024-02-10 ENCOUNTER — Ambulatory Visit: Payer: Self-pay | Admitting: Pulmonary Disease

## 2024-02-10 ENCOUNTER — Ambulatory Visit: Admitting: Family Medicine

## 2024-02-10 VITALS — BP 118/70 | HR 71 | Temp 97.8°F | Resp 18 | Ht 60.0 in | Wt 155.6 lb

## 2024-02-10 DIAGNOSIS — N289 Disorder of kidney and ureter, unspecified: Secondary | ICD-10-CM

## 2024-02-10 DIAGNOSIS — I252 Old myocardial infarction: Secondary | ICD-10-CM

## 2024-02-10 DIAGNOSIS — E785 Hyperlipidemia, unspecified: Secondary | ICD-10-CM

## 2024-02-10 DIAGNOSIS — J479 Bronchiectasis, uncomplicated: Secondary | ICD-10-CM | POA: Diagnosis not present

## 2024-02-10 DIAGNOSIS — C8338 Diffuse large B-cell lymphoma, lymph nodes of multiple sites: Secondary | ICD-10-CM

## 2024-02-10 DIAGNOSIS — E039 Hypothyroidism, unspecified: Secondary | ICD-10-CM

## 2024-02-10 DIAGNOSIS — J189 Pneumonia, unspecified organism: Secondary | ICD-10-CM

## 2024-02-10 DIAGNOSIS — I10 Essential (primary) hypertension: Secondary | ICD-10-CM

## 2024-02-10 DIAGNOSIS — H3530 Unspecified macular degeneration: Secondary | ICD-10-CM

## 2024-02-10 DIAGNOSIS — I214 Non-ST elevation (NSTEMI) myocardial infarction: Secondary | ICD-10-CM

## 2024-02-10 MED ORDER — ALBUTEROL SULFATE HFA 108 (90 BASE) MCG/ACT IN AERS: 2.0000 | INHALATION_SPRAY | Freq: Four times a day (QID) | RESPIRATORY_TRACT | 0 refills | Status: AC | PRN

## 2024-02-10 MED ORDER — NONFORMULARY OR COMPOUNDED ITEM: 3 refills | Status: AC

## 2024-02-10 NOTE — Progress Notes (Unsigned)
 Subjective:    Patient ID: Amanda [name], female    DOB: [birth year], 83 y.o.   MRN: [id number]  Chief Complaint  Patient presents with   Hypertension   Hypothyroidism   Follow-up    HPI Patient is in today for f/u   Discussed the use of AI scribe software for clinical note transcription with the patient, who gave verbal consent to proceed.  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   Lumbar burst fracture (HCC)    Lymphoproliferative disorder (HCC)    Macular degeneration 2013   Both eyes    Macular degeneration, bilateral    Marginal zone lymphoma (HCC)    Osteopenia    Pneumonia    Pneumonia due to COVID-19 virus 2021   Required hospitalization   PONV (postoperative nausea and vomiting)    needs little anesthesia   Shingles    Shortness of breath    on exertion   Spleen enlarged    SUI (stress urinary incontinence, female)    Urinary, incontinence, stress female    Wears glasses     Past Surgical History:  Procedure Laterality Date   BREAST EXCISIONAL BIOPSY Left 1980   CARPAL TUNNEL RELEASE  1999   CATARACT EXTRACTION  2009, 2011   BOTH EYES   CATARACT EXTRACTION, BILATERAL     CESAREAN SECTION  1959   CESAREAN SECTION     COLONOSCOPY      Dr Donnald   DILATION AND CURETTAGE OF UTERUS     X2   EYE SURGERY  Cataracts removed   FRACTURE SURGERY  12/12/2018   Left ankle plates   HYSTEROSCOPY WITH D & C  01/07/2012   Procedure: DILATATION AND CURETTAGE /HYSTEROSCOPY;  Surgeon: Curlee VEAR Guan, MD;  Location: [facility];   Service: Gynecology;  Laterality: N/A;  intrauterine foley catheter for tamponode    IR IMAGING GUIDED PORT INSERTION  07/15/2018   IR REMOVAL TUN ACCESS W/ PORT W/O FL MOD SED  04/11/2021   LEFT HEART CATH AND CORONARY ANGIOGRAPHY N/A 06/27/2023   Procedure: LEFT HEART CATH AND CORONARY ANGIOGRAPHY;  Surgeon: Verlin Lonni BIRCH, MD;  Location: [facility];  Service: Cardiovascular;  Laterality: N/A;   LYMPH NODE BIOPSY Left 05/26/2018   Procedure: LEFT AXILLARY LYMPH NODE BIOPSY;  Surgeon: Gail Favorite, MD;  Location: [facility] OR;  Service: General;  Laterality: Left;   ORIF ANKLE FRACTURE Left 12/12/2018   ORIF ANKLE FRACTURE Left 12/12/2018   Procedure: OPEN REDUCTION INTERNAL FIXATION (ORIF) ANKLE FRACTURE;  Surgeon: Addie Cordella Hamilton, MD;  Location: [facility] OR;  Service: Orthopedics;  Laterality: Left;   ORIF ANKLE FRACTURE Left 12/2018   TONSILLECTOMY     TONSILLECTOMY AND ADENOIDECTOMY     TUBAL LIGATION     BY LAPAROSCOPY   WISDOM TOOTH EXTRACTION      Family History  Problem Relation Age of Onset   Ovarian cancer Mother  Breast cancer Mother 14   Hypertension Father    Prostate cancer Father    Kidney failure Father    Diabetes Father    Kidney disease Father    Hyperlipidemia Brother    COPD Paternal Grandfather    Stroke Maternal Grandfather    Hypercalcemia Neg Hx     Social History   Socioeconomic History   Marital status: Married    Spouse name: Not on file   Number of children: Not on file   Years of education: Not on file   Highest education level: Not on file  Occupational History   Not on file  Tobacco Use   Smoking status: Never   Smokeless tobacco: Never  Vaping Use   Vaping status: Never Used  Substance and Sexual Activity   Alcohol use: Yes    Alcohol/week: 1.0 standard drink of alcohol    Types: 1 Glasses of wine per week    Comment: RARE   Drug use: Never   Sexual activity: Not Currently    Birth control/protection: Post-menopausal   Other Topics Concern   Not on file  Social History Narrative   ** Merged History Encounter **       ** Merged History Encounter **       Social Drivers of Corporate Investment Banker Strain: Low Risk  (08/08/2023)   Overall Financial Resource Strain (CARDIA)    Difficulty of Paying Living Expenses: Not very hard  Food Insecurity: No Food Insecurity (08/08/2023)   Hunger Vital Sign    Worried About Running Out of Food in the Last Year: Never true    Ran Out of Food in the Last Year: Never true  Transportation Needs: No Transportation Needs (08/08/2023)   PRAPARE - Administrator, Civil Service (Medical): No    Lack of Transportation (Non-Medical): No  Physical Activity: Sufficiently Active (08/08/2023)   Exercise Vital Sign    Days of Exercise per Week: 4 days    Minutes of Exercise per Session: 50 min  Stress: No Stress Concern Present (08/08/2023)   [employer] of Occupational Health - Occupational Stress Questionnaire    Feeling of Stress : Not at all  Social Connections: Socially Integrated (06/27/2023)   Social Connection and Isolation Panel    Frequency of Communication with Friends and Family: More than three times a week    Frequency of Social Gatherings with Friends and Family: Twice a week    Attends Religious Services: More than 4 times per year    Active Member of [employer] or Organizations: Yes    Attends Engineer, Structural: More than 4 times per year    Marital Status: Married  Catering Manager Violence: Not At Risk (08/08/2023)   Humiliation, Afraid, Rape, and Kick questionnaire    Fear of Current or Ex-Partner: No    Emotionally Abused: No    Physically Abused: No    Sexually Abused: No    Outpatient Medications Prior to Visit  Medication Sig Dispense Refill   acetaminophen  (TYLENOL ) 325 MG tablet Take 1-2 tablets (325-650 mg total) by mouth every 8 (eight) hours as needed for mild pain.     amLODipine  (NORVASC ) 10 MG tablet Take 1 tablet (10  mg total) by mouth daily. 90 tablet 0   buPROPion  (WELLBUTRIN  XL) 150 MG 24 hr tablet Take 1 tablet (150 mg total) by mouth daily. 90 tablet 3   Calcium -Cholecalciferol (QC CALCIUM  500MG -D3) 500-5 MG-MCG TABS Take 500 mg by  mouth daily.     guaiFENesin  (MUCINEX ) 600 MG 12 hr tablet Take 600 mg by mouth daily.     ibuprofen (ADVIL) 200 MG tablet Take 200 mg by mouth daily as needed for mild pain (pain score 1-3).     levothyroxine  (SYNTHROID ) 125 MCG tablet Take 1 tablet (125 mcg total) by mouth daily before breakfast. 90 tablet 1   pantoprazole  (PROTONIX ) 40 MG tablet Take 1 tablet (40 mg total) by mouth daily. 90 tablet 1   senna-docusate (SENOKOT-S) 8.6-50 MG tablet Take 2 tablets by mouth at bedtime. 60 tablet 0   albuterol  (VENTOLIN  HFA) 108 (90 Base) MCG/ACT inhaler INHALE 2 PUFFS INTO THE LUNGS EVERY 6 HOURS AS NEEDED FOR WHEEZING OR SHORTNESS OF BREATH 54 g 0   Cholecalciferol (VITAMIN D3 ADULT GUMMIES) 25 MCG (1000 UT) CHEW Chew 2 each by mouth daily. (Patient not taking: Reported on 02/10/2024)     estradiol (ESTRACE) 0.1 MG/GM vaginal cream Place 1 Applicatorful vaginally. Use once a day for 14 days, then decrease to use twice a week thereafter. (Patient not taking: Reported on 02/10/2024)     mirabegron  ER (MYRBETRIQ ) 25 MG TB24 tablet Take 25 mg by mouth daily. (Patient not taking: Reported on 02/10/2024)     nitroGLYCERIN  (NITROSTAT ) 0.4 MG SL tablet Place 1 tablet (0.4 mg total) under the tongue every 5 (five) minutes x 3 doses as needed for chest pain. (Patient not taking: Reported on 02/10/2024) 25 tablet 3   rosuvastatin  (CRESTOR ) 20 MG tablet Take 1 tablet (20 mg total) by mouth daily. (Patient not taking: Reported on 02/10/2024) 90 tablet 3   triamcinolone  cream (KENALOG ) 0.1 % Apply 1 Application topically 2 (two) times daily. (Patient not taking: Reported on 02/10/2024) 30 g 0   No facility-administered medications prior to visit.    Allergies  Allergen Reactions   Phenergan   [Promethazine  Hcl] Other (See Comments)    Jerking/agitation   Preservision Areds 2 [Multiple Vitamins-Minerals] Nausea Only   Gabapentin  Other (See Comments)   Lidocaine -Epinephrine  Other (See Comments)    1% lidocaine  with epinephrine ----CHEST PAIN--NSTEMI   Levaquin  [Levofloxacin ] Nausea And Vomiting   Pravastatin Nausea And Vomiting    ROS     Objective:    Physical Exam  BP 118/70 (BP Location: Right Arm, Patient Position: Sitting, Cuff Size: Normal)   Pulse 71   Temp 97.8 F (36.6 C) (Oral)   Resp 18   Ht 5' (1.524 m)   Wt 155 lb 9.6 oz (70.6 kg)   SpO2 97%   BMI 30.39 kg/m  Wt Readings from Last 3 Encounters:  02/10/24 155 lb 9.6 oz (70.6 kg)  01/07/24 154 lb (69.9 kg)  12/04/23 154 lb (69.9 kg)    Diabetic Foot Exam - Simple   No data filed    Lab Results  Component Value Date   WBC 7.2 06/27/2023   HGB 12.8 06/27/2023   HCT 38.2 06/27/2023   PLT 209 06/27/2023   GLUCOSE 84 08/08/2023   CHOL 186 06/27/2023   TRIG 60 06/27/2023   HDL 64 06/27/2023   LDLDIRECT 123.0 04/27/2020   LDLCALC 110 (H) 06/27/2023   ALT 13 08/08/2023   AST 16 08/08/2023   NA 140 08/08/2023   K 4.7 08/08/2023   CL 104 08/08/2023   CREATININE 1.08 (H) 08/08/2023   BUN 25 08/08/2023   CO2 25 08/08/2023   TSH 3.22 08/08/2023   INR 1.0 04/01/2020   HGBA1C 6.1 (H) 11/18/2014    Lab  Results  Component Value Date   TSH 3.22 08/08/2023   Lab Results  Component Value Date   WBC 7.2 06/27/2023   HGB 12.8 06/27/2023   HCT 38.2 06/27/2023   MCV 84.3 06/27/2023   PLT 209 06/27/2023   Lab Results  Component Value Date   NA 140 08/08/2023   K 4.7 08/08/2023   CO2 25 08/08/2023   GLUCOSE 84 08/08/2023   BUN 25 08/08/2023   CREATININE 1.08 (H) 08/08/2023   BILITOT 0.3 08/08/2023   ALKPHOS 88 03/21/2023   AST 16 08/08/2023   ALT 13 08/08/2023   PROT 6.4 08/08/2023   ALBUMIN  4.6 03/21/2023   CALCIUM  9.2 08/08/2023   ANIONGAP 8 06/27/2023   EGFR 51 (L) 08/08/2023    GFR 49.45 (L) 03/21/2023   Lab Results  Component Value Date   CHOL 186 06/27/2023   Lab Results  Component Value Date   HDL 64 06/27/2023   Lab Results  Component Value Date   LDLCALC 110 (H) 06/27/2023   Lab Results  Component Value Date   TRIG 60 06/27/2023   Lab Results  Component Value Date   CHOLHDL 2.9 06/27/2023   Lab Results  Component Value Date   HGBA1C 6.1 (H) 11/18/2014       Assessment & Plan:  Primary hypertension -     CBC with Differential/Platelet -     Comprehensive metabolic panel with GFR -     Lipid panel  NSTEMI (non-ST elevated myocardial infarction) (HCC)  Hypothyroidism, unspecified type -     TSH  Hyperlipidemia, unspecified hyperlipidemia type -     Comprehensive metabolic panel with GFR -     Lipid panel  Renal insufficiency -     Comprehensive metabolic panel with GFR  Diffuse large b-cell lymphoma, lymph nodes of multiple sites (HCC)  Bronchiectasis without complication (HCC) -     NONFORMULARY OR COMPOUNDED ITEM; Nebulizer supplies and tubing  Dispense: 1 each; Refill: 3  Community acquired pneumonia of right middle lobe of lung -     Albuterol  Sulfate HFA; Inhale 2 puffs into the lungs every 6 (six) hours as needed for wheezing or shortness of breath.  Dispense: 54 g; Refill: 0  Assessment and Plan Assessment & Plan     Jamee JONELLE Antonio Cyndee, DO

## 2024-02-11 ENCOUNTER — Other Ambulatory Visit: Payer: Self-pay | Admitting: Family Medicine

## 2024-02-11 LAB — COMPREHENSIVE METABOLIC PANEL WITH GFR
ALT: 13 U/L (ref 0–35)
AST: 16 U/L (ref 0–37)
Albumin: 4.3 g/dL (ref 3.5–5.2)
Alkaline Phosphatase: 84 U/L (ref 39–117)
BUN: 22 mg/dL (ref 6–23)
CO2: 29 meq/L (ref 19–32)
Calcium: 9.3 mg/dL (ref 8.4–10.5)
Chloride: 103 meq/L (ref 96–112)
Creatinine, Ser: 1.21 mg/dL — ABNORMAL HIGH (ref 0.40–1.20)
GFR: 40.98 mL/min — ABNORMAL LOW (ref >60.00)
Glucose, Bld: 107 mg/dL — ABNORMAL HIGH (ref 70–99)
Potassium: 4.6 meq/L (ref 3.5–5.1)
Sodium: 140 meq/L (ref 135–145)
Total Bilirubin: 0.3 mg/dL (ref 0.2–1.2)
Total Protein: 6.5 g/dL (ref 6.0–8.3)

## 2024-02-11 LAB — CBC WITH DIFFERENTIAL/PLATELET
Basophils Absolute: 0.1 K/uL (ref 0.0–0.1)
Basophils Relative: 1.1 % (ref 0.0–3.0)
Eosinophils Absolute: 0.2 K/uL (ref 0.0–0.7)
Eosinophils Relative: 3.1 % (ref 0.0–5.0)
HCT: 37.7 % (ref 36.0–46.0)
Hemoglobin: 12.6 g/dL (ref 12.0–15.0)
Lymphocytes Relative: 24.3 % (ref 12.0–46.0)
Lymphs Abs: 1.7 K/uL (ref 0.7–4.0)
MCHC: 33.5 g/dL (ref 30.0–36.0)
MCV: 84.2 fl (ref 78.0–100.0)
Monocytes Absolute: 0.8 K/uL (ref 0.1–1.0)
Monocytes Relative: 11.6 % (ref 3.0–12.0)
Neutro Abs: 4.2 K/uL (ref 1.4–7.7)
Neutrophils Relative %: 59.9 % (ref 43.0–77.0)
Platelets: 273 K/uL (ref 150.0–400.0)
RBC: 4.47 Mil/uL (ref 3.87–5.11)
RDW: 14.7 % (ref 11.5–15.5)
WBC: 7 K/uL (ref 4.0–10.5)

## 2024-02-11 LAB — LIPID PANEL
Cholesterol: 205 mg/dL — ABNORMAL HIGH (ref 0–200)
HDL: 61.8 mg/dL (ref >39.00)
LDL Cholesterol: 121 mg/dL — ABNORMAL HIGH (ref 0–99)
NonHDL: 143.21
Total CHOL/HDL Ratio: 3
Triglycerides: 109 mg/dL (ref 0.0–149.0)
VLDL: 21.8 mg/dL (ref 0.0–40.0)

## 2024-02-11 LAB — TSH: TSH: 4.14 u[IU]/mL (ref 0.35–5.50)

## 2024-02-12 ENCOUNTER — Other Ambulatory Visit: Payer: Self-pay | Admitting: Family Medicine

## 2024-02-12 ENCOUNTER — Ambulatory Visit: Payer: Self-pay | Admitting: Family Medicine

## 2024-02-12 DIAGNOSIS — N289 Disorder of kidney and ureter, unspecified: Secondary | ICD-10-CM

## 2024-02-12 DIAGNOSIS — C8338 Diffuse large B-cell lymphoma, lymph nodes of multiple sites: Secondary | ICD-10-CM | POA: Insufficient documentation

## 2024-02-12 DIAGNOSIS — E785 Hyperlipidemia, unspecified: Secondary | ICD-10-CM

## 2024-02-12 NOTE — Assessment & Plan Note (Signed)
 Per hematology

## 2024-02-13 ENCOUNTER — Other Ambulatory Visit

## 2024-02-13 DIAGNOSIS — J479 Bronchiectasis, uncomplicated: Secondary | ICD-10-CM

## 2024-02-15 LAB — RESPIRATORY CULTURE OR RESPIRATORY AND SPUTUM CULTURE: MICRO NUMBER:: 17349430

## 2024-02-16 DIAGNOSIS — M542 Cervicalgia: Secondary | ICD-10-CM | POA: Diagnosis not present

## 2024-02-16 DIAGNOSIS — M545 Low back pain, unspecified: Secondary | ICD-10-CM | POA: Diagnosis not present

## 2024-02-19 DIAGNOSIS — H53413 Scotoma involving central area, bilateral: Secondary | ICD-10-CM | POA: Diagnosis not present

## 2024-03-10 ENCOUNTER — Encounter: Payer: Self-pay | Admitting: Internal Medicine

## 2024-03-15 ENCOUNTER — Encounter: Payer: Self-pay | Admitting: *Deleted

## 2024-03-30 ENCOUNTER — Other Ambulatory Visit: Payer: Self-pay | Admitting: Family Medicine

## 2024-03-31 LAB — ACID-FAST (MYCOBACTERIA) SMEAR AND CULTURE WITH REFLEX TO IDENTIFICATION
Acid Fast Culture: NEGATIVE
Acid Fast Smear: NEGATIVE

## 2024-04-01 ENCOUNTER — Other Ambulatory Visit: Payer: Self-pay | Admitting: Family Medicine

## 2024-04-06 MED ORDER — BUPROPION HCL ER (XL) 150 MG PO TB24: 150.0000 mg | ORAL_TABLET | Freq: Every day | ORAL | 1 refills | Status: AC

## 2024-04-06 NOTE — Telephone Encounter (Unsigned)
 Copied from CRM #[id number]. Topic: Clinical - Prescription Issue >> Apr 06, 2024  1:00 PM Robinson H wrote: Reason for CRM: Patient following up on refill request for her buPROPion  (WELLBUTRIN  XL) 150 MG 24 hr tablet, refill request shows E-Prescribing Status: Transmission to pharmacy failed (04/02/2024 11:30 AM EST) and also shows discontinued. Patient states she is out of medication.  [place]ween [phone]

## 2024-04-28 ENCOUNTER — Ambulatory Visit: Admitting: Pulmonary Disease

## 2024-05-28 ENCOUNTER — Ambulatory Visit: Admitting: Internal Medicine

## 2024-08-13 ENCOUNTER — Ambulatory Visit
